# Patient Record
Sex: Female | Born: 1935 | Race: White | Hispanic: No | Marital: Married | Smoking: Never smoker
Health system: Southern US, Community
[De-identification: ages and names within clinical notes are randomized; demographics above are authoritative.]

## PROBLEM LIST (undated history)

## (undated) DIAGNOSIS — J4550 Severe persistent asthma, uncomplicated: Secondary | ICD-10-CM

## (undated) DIAGNOSIS — R519 Headache, unspecified: Secondary | ICD-10-CM

## (undated) DIAGNOSIS — E041 Nontoxic single thyroid nodule: Secondary | ICD-10-CM

## (undated) DIAGNOSIS — R600 Localized edema: Secondary | ICD-10-CM

## (undated) DIAGNOSIS — Z853 Personal history of malignant neoplasm of breast: Secondary | ICD-10-CM

## (undated) DIAGNOSIS — D649 Anemia, unspecified: Secondary | ICD-10-CM

## (undated) DIAGNOSIS — M810 Age-related osteoporosis without current pathological fracture: Secondary | ICD-10-CM

## (undated) DIAGNOSIS — L97929 Non-pressure chronic ulcer of unspecified part of left lower leg with unspecified severity: Secondary | ICD-10-CM

## (undated) DIAGNOSIS — G2581 Restless legs syndrome: Secondary | ICD-10-CM

## (undated) DIAGNOSIS — K5790 Diverticulosis of intestine, part unspecified, without perforation or abscess without bleeding: Secondary | ICD-10-CM

## (undated) DIAGNOSIS — K219 Gastro-esophageal reflux disease without esophagitis: Secondary | ICD-10-CM

## (undated) DIAGNOSIS — Z86718 Personal history of other venous thrombosis and embolism: Secondary | ICD-10-CM

## (undated) DIAGNOSIS — M5030 Other cervical disc degeneration, unspecified cervical region: Secondary | ICD-10-CM

## (undated) DIAGNOSIS — J189 Pneumonia, unspecified organism: Secondary | ICD-10-CM

## (undated) DIAGNOSIS — E785 Hyperlipidemia, unspecified: Secondary | ICD-10-CM

## (undated) DIAGNOSIS — N182 Chronic kidney disease, stage 2 (mild): Secondary | ICD-10-CM

## (undated) DIAGNOSIS — K5792 Diverticulitis of intestine, part unspecified, without perforation or abscess without bleeding: Secondary | ICD-10-CM

## (undated) DIAGNOSIS — R011 Cardiac murmur, unspecified: Secondary | ICD-10-CM

## (undated) DIAGNOSIS — L509 Urticaria, unspecified: Secondary | ICD-10-CM

## (undated) DIAGNOSIS — T783XXA Angioneurotic edema, initial encounter: Secondary | ICD-10-CM

## (undated) DIAGNOSIS — I739 Peripheral vascular disease, unspecified: Secondary | ICD-10-CM

## (undated) DIAGNOSIS — K648 Other hemorrhoids: Secondary | ICD-10-CM

## (undated) DIAGNOSIS — I10 Essential (primary) hypertension: Secondary | ICD-10-CM

## (undated) DIAGNOSIS — Z8619 Personal history of other infectious and parasitic diseases: Secondary | ICD-10-CM

## (undated) DIAGNOSIS — M069 Rheumatoid arthritis, unspecified: Secondary | ICD-10-CM

## (undated) DIAGNOSIS — J3089 Other allergic rhinitis: Secondary | ICD-10-CM

## (undated) DIAGNOSIS — C801 Malignant (primary) neoplasm, unspecified: Secondary | ICD-10-CM

## (undated) HISTORY — PX: MASTECTOMY: SHX3

## (undated) HISTORY — DX: Urticaria, unspecified: L50.9

## (undated) HISTORY — DX: Angioneurotic edema, initial encounter: T78.3XXA

## (undated) HISTORY — DX: Essential (primary) hypertension: I10

## (undated) HISTORY — DX: Diverticulosis of intestine, part unspecified, without perforation or abscess without bleeding: K57.90

## (undated) HISTORY — DX: Restless legs syndrome: G25.81

## (undated) HISTORY — DX: Chronic kidney disease, stage 2 (mild): N18.2

## (undated) HISTORY — DX: Other allergic rhinitis: J30.89

## (undated) HISTORY — PX: LUMBAR DISC SURGERY: SHX700

## (undated) HISTORY — PX: CATARACT EXTRACTION W/ INTRAOCULAR LENS  IMPLANT, BILATERAL: SHX1307

## (undated) HISTORY — PX: CARDIOVASCULAR STRESS TEST: SHX262

## (undated) HISTORY — DX: Age-related osteoporosis without current pathological fracture: M81.0

## (undated) HISTORY — PX: CARDIAC CATHETERIZATION: SHX172

## (undated) HISTORY — DX: Hyperlipidemia, unspecified: E78.5

## (undated) HISTORY — DX: Malignant (primary) neoplasm, unspecified: C80.1

## (undated) HISTORY — PX: KNEE ARTHROPLASTY: SHX992

---

## 1987-05-13 HISTORY — PX: TOTAL ABDOMINAL HYSTERECTOMY W/ BILATERAL SALPINGOOPHORECTOMY: SHX83

## 1991-05-13 HISTORY — PX: CHOLECYSTECTOMY: SHX55

## 1999-03-11 ENCOUNTER — Encounter: Payer: Self-pay | Admitting: Hematology and Oncology

## 1999-03-11 ENCOUNTER — Encounter: Admission: RE | Admit: 1999-03-11 | Discharge: 1999-03-11 | Payer: Self-pay | Admitting: Hematology and Oncology

## 2000-08-21 ENCOUNTER — Inpatient Hospital Stay (HOSPITAL_COMMUNITY): Admission: EM | Admit: 2000-08-21 | Discharge: 2000-08-25 | Payer: Self-pay | Admitting: Emergency Medicine

## 2000-08-21 ENCOUNTER — Encounter: Payer: Self-pay | Admitting: Emergency Medicine

## 2000-08-23 ENCOUNTER — Encounter: Payer: Self-pay | Admitting: Internal Medicine

## 2000-09-08 ENCOUNTER — Ambulatory Visit (HOSPITAL_COMMUNITY): Admission: RE | Admit: 2000-09-08 | Discharge: 2000-09-08 | Payer: Self-pay | Admitting: Critical Care Medicine

## 2003-12-01 ENCOUNTER — Ambulatory Visit (HOSPITAL_COMMUNITY): Admission: RE | Admit: 2003-12-01 | Discharge: 2003-12-01 | Payer: Self-pay | Admitting: Critical Care Medicine

## 2004-03-01 ENCOUNTER — Encounter: Payer: Self-pay | Admitting: Internal Medicine

## 2004-04-12 ENCOUNTER — Ambulatory Visit: Payer: Self-pay | Admitting: Internal Medicine

## 2004-04-24 ENCOUNTER — Ambulatory Visit: Payer: Self-pay | Admitting: Critical Care Medicine

## 2004-05-10 ENCOUNTER — Ambulatory Visit: Payer: Self-pay | Admitting: Critical Care Medicine

## 2004-06-12 ENCOUNTER — Ambulatory Visit: Payer: Self-pay | Admitting: Critical Care Medicine

## 2004-07-10 ENCOUNTER — Ambulatory Visit: Payer: Self-pay | Admitting: Critical Care Medicine

## 2004-07-15 ENCOUNTER — Ambulatory Visit: Payer: Self-pay | Admitting: Pulmonary Disease

## 2004-07-30 ENCOUNTER — Ambulatory Visit: Payer: Self-pay | Admitting: Critical Care Medicine

## 2004-08-08 ENCOUNTER — Ambulatory Visit: Payer: Self-pay | Admitting: Critical Care Medicine

## 2004-08-16 ENCOUNTER — Ambulatory Visit: Payer: Self-pay | Admitting: Critical Care Medicine

## 2004-08-20 ENCOUNTER — Emergency Department (HOSPITAL_COMMUNITY): Admission: EM | Admit: 2004-08-20 | Discharge: 2004-08-20 | Payer: Self-pay | Admitting: Emergency Medicine

## 2004-09-02 ENCOUNTER — Ambulatory Visit: Payer: Self-pay | Admitting: Critical Care Medicine

## 2004-09-10 ENCOUNTER — Ambulatory Visit: Payer: Self-pay | Admitting: Critical Care Medicine

## 2004-09-16 ENCOUNTER — Ambulatory Visit: Payer: Self-pay | Admitting: Pulmonary Disease

## 2004-10-01 ENCOUNTER — Ambulatory Visit: Payer: Self-pay | Admitting: Critical Care Medicine

## 2004-10-15 ENCOUNTER — Ambulatory Visit: Payer: Self-pay | Admitting: Pulmonary Disease

## 2004-10-29 ENCOUNTER — Ambulatory Visit: Payer: Self-pay | Admitting: Pulmonary Disease

## 2004-11-14 ENCOUNTER — Ambulatory Visit: Payer: Self-pay | Admitting: Critical Care Medicine

## 2004-11-28 ENCOUNTER — Ambulatory Visit: Payer: Self-pay | Admitting: Pulmonary Disease

## 2004-12-16 ENCOUNTER — Ambulatory Visit: Payer: Self-pay | Admitting: Pulmonary Disease

## 2004-12-30 ENCOUNTER — Ambulatory Visit: Payer: Self-pay | Admitting: Internal Medicine

## 2005-01-08 ENCOUNTER — Ambulatory Visit: Payer: Self-pay | Admitting: Critical Care Medicine

## 2005-01-14 ENCOUNTER — Ambulatory Visit: Payer: Self-pay | Admitting: Internal Medicine

## 2005-01-29 ENCOUNTER — Ambulatory Visit: Payer: Self-pay | Admitting: Critical Care Medicine

## 2005-02-12 ENCOUNTER — Ambulatory Visit: Payer: Self-pay | Admitting: Emergency Medicine

## 2005-02-26 ENCOUNTER — Ambulatory Visit: Payer: Self-pay | Admitting: Pulmonary Disease

## 2005-03-05 ENCOUNTER — Ambulatory Visit: Payer: Self-pay | Admitting: Critical Care Medicine

## 2005-03-12 ENCOUNTER — Ambulatory Visit: Payer: Self-pay | Admitting: Pulmonary Disease

## 2005-03-28 ENCOUNTER — Ambulatory Visit: Payer: Self-pay | Admitting: Pulmonary Disease

## 2005-04-11 ENCOUNTER — Ambulatory Visit: Payer: Self-pay | Admitting: Internal Medicine

## 2005-04-22 ENCOUNTER — Ambulatory Visit: Payer: Self-pay | Admitting: Critical Care Medicine

## 2005-04-25 ENCOUNTER — Ambulatory Visit: Payer: Self-pay | Admitting: Pulmonary Disease

## 2005-05-09 ENCOUNTER — Ambulatory Visit: Payer: Self-pay | Admitting: Pulmonary Disease

## 2005-05-13 ENCOUNTER — Ambulatory Visit: Payer: Self-pay | Admitting: Critical Care Medicine

## 2005-05-20 ENCOUNTER — Ambulatory Visit: Payer: Self-pay | Admitting: Critical Care Medicine

## 2005-05-27 ENCOUNTER — Ambulatory Visit: Payer: Self-pay | Admitting: Critical Care Medicine

## 2005-06-10 ENCOUNTER — Ambulatory Visit: Payer: Self-pay | Admitting: Pulmonary Disease

## 2005-06-24 ENCOUNTER — Ambulatory Visit: Payer: Self-pay | Admitting: Critical Care Medicine

## 2005-07-07 ENCOUNTER — Ambulatory Visit: Payer: Self-pay | Admitting: Pulmonary Disease

## 2005-07-10 ENCOUNTER — Ambulatory Visit: Payer: Self-pay | Admitting: Internal Medicine

## 2005-07-24 ENCOUNTER — Ambulatory Visit: Payer: Self-pay | Admitting: Internal Medicine

## 2005-07-31 ENCOUNTER — Ambulatory Visit: Payer: Self-pay | Admitting: Critical Care Medicine

## 2005-08-07 ENCOUNTER — Ambulatory Visit: Payer: Self-pay | Admitting: Internal Medicine

## 2005-08-22 ENCOUNTER — Ambulatory Visit: Payer: Self-pay | Admitting: Critical Care Medicine

## 2005-09-05 ENCOUNTER — Ambulatory Visit: Payer: Self-pay | Admitting: Internal Medicine

## 2005-09-16 ENCOUNTER — Emergency Department (HOSPITAL_COMMUNITY): Admission: EM | Admit: 2005-09-16 | Discharge: 2005-09-16 | Payer: Self-pay | Admitting: Emergency Medicine

## 2005-09-22 ENCOUNTER — Ambulatory Visit: Payer: Self-pay | Admitting: Pulmonary Disease

## 2005-10-03 ENCOUNTER — Ambulatory Visit: Payer: Self-pay | Admitting: Critical Care Medicine

## 2005-10-07 ENCOUNTER — Ambulatory Visit: Payer: Self-pay | Admitting: Internal Medicine

## 2005-10-22 ENCOUNTER — Ambulatory Visit: Payer: Self-pay | Admitting: Internal Medicine

## 2005-11-05 ENCOUNTER — Ambulatory Visit: Payer: Self-pay | Admitting: Pulmonary Disease

## 2005-11-05 ENCOUNTER — Ambulatory Visit: Payer: Self-pay | Admitting: Internal Medicine

## 2005-11-19 ENCOUNTER — Ambulatory Visit: Payer: Self-pay | Admitting: Critical Care Medicine

## 2005-12-03 ENCOUNTER — Ambulatory Visit: Payer: Self-pay | Admitting: Critical Care Medicine

## 2005-12-17 ENCOUNTER — Ambulatory Visit: Payer: Self-pay | Admitting: Critical Care Medicine

## 2005-12-31 ENCOUNTER — Ambulatory Visit: Payer: Self-pay | Admitting: Critical Care Medicine

## 2006-01-01 ENCOUNTER — Encounter: Payer: Self-pay | Admitting: Critical Care Medicine

## 2006-01-06 ENCOUNTER — Ambulatory Visit: Payer: Self-pay | Admitting: Critical Care Medicine

## 2006-01-14 ENCOUNTER — Ambulatory Visit: Payer: Self-pay | Admitting: Critical Care Medicine

## 2006-01-28 ENCOUNTER — Ambulatory Visit: Payer: Self-pay | Admitting: Critical Care Medicine

## 2006-03-10 ENCOUNTER — Ambulatory Visit: Payer: Self-pay | Admitting: Critical Care Medicine

## 2006-03-12 ENCOUNTER — Ambulatory Visit (HOSPITAL_COMMUNITY): Admission: RE | Admit: 2006-03-12 | Discharge: 2006-03-12 | Payer: Self-pay | Admitting: Internal Medicine

## 2006-03-31 ENCOUNTER — Ambulatory Visit: Payer: Self-pay | Admitting: Critical Care Medicine

## 2006-04-06 ENCOUNTER — Ambulatory Visit: Payer: Self-pay | Admitting: Critical Care Medicine

## 2006-04-14 ENCOUNTER — Ambulatory Visit: Payer: Self-pay | Admitting: Critical Care Medicine

## 2006-05-06 ENCOUNTER — Ambulatory Visit: Payer: Self-pay | Admitting: Critical Care Medicine

## 2006-05-14 ENCOUNTER — Ambulatory Visit: Payer: Self-pay | Admitting: Internal Medicine

## 2006-05-28 ENCOUNTER — Ambulatory Visit: Payer: Self-pay | Admitting: Internal Medicine

## 2006-06-11 ENCOUNTER — Ambulatory Visit: Payer: Self-pay | Admitting: Internal Medicine

## 2006-06-22 ENCOUNTER — Ambulatory Visit: Payer: Self-pay | Admitting: Critical Care Medicine

## 2006-06-25 ENCOUNTER — Ambulatory Visit: Payer: Self-pay | Admitting: Internal Medicine

## 2006-07-09 ENCOUNTER — Ambulatory Visit: Payer: Self-pay | Admitting: Internal Medicine

## 2006-07-23 ENCOUNTER — Ambulatory Visit: Payer: Self-pay | Admitting: Internal Medicine

## 2006-08-06 ENCOUNTER — Ambulatory Visit: Payer: Self-pay | Admitting: Critical Care Medicine

## 2006-08-20 ENCOUNTER — Ambulatory Visit: Payer: Self-pay | Admitting: Critical Care Medicine

## 2006-09-03 ENCOUNTER — Ambulatory Visit: Payer: Self-pay | Admitting: Critical Care Medicine

## 2006-09-17 ENCOUNTER — Ambulatory Visit: Payer: Self-pay | Admitting: Critical Care Medicine

## 2006-10-01 ENCOUNTER — Ambulatory Visit: Payer: Self-pay | Admitting: Critical Care Medicine

## 2006-10-13 ENCOUNTER — Ambulatory Visit: Payer: Self-pay | Admitting: Critical Care Medicine

## 2006-10-27 ENCOUNTER — Ambulatory Visit: Payer: Self-pay | Admitting: Critical Care Medicine

## 2006-11-10 ENCOUNTER — Ambulatory Visit: Payer: Self-pay | Admitting: Critical Care Medicine

## 2006-11-17 ENCOUNTER — Ambulatory Visit: Payer: Self-pay | Admitting: Critical Care Medicine

## 2006-11-24 ENCOUNTER — Ambulatory Visit: Payer: Self-pay | Admitting: Critical Care Medicine

## 2006-12-08 ENCOUNTER — Ambulatory Visit: Payer: Self-pay | Admitting: Critical Care Medicine

## 2006-12-09 ENCOUNTER — Ambulatory Visit (HOSPITAL_COMMUNITY): Admission: RE | Admit: 2006-12-09 | Discharge: 2006-12-09 | Payer: Self-pay | Admitting: Internal Medicine

## 2006-12-10 ENCOUNTER — Inpatient Hospital Stay (HOSPITAL_COMMUNITY): Admission: EM | Admit: 2006-12-10 | Discharge: 2006-12-12 | Payer: Self-pay | Admitting: *Deleted

## 2006-12-16 ENCOUNTER — Ambulatory Visit: Payer: Self-pay | Admitting: Gastroenterology

## 2006-12-18 ENCOUNTER — Ambulatory Visit: Payer: Self-pay | Admitting: Critical Care Medicine

## 2006-12-28 ENCOUNTER — Ambulatory Visit: Payer: Self-pay | Admitting: Vascular Surgery

## 2006-12-31 ENCOUNTER — Ambulatory Visit: Payer: Self-pay | Admitting: Critical Care Medicine

## 2007-01-05 ENCOUNTER — Ambulatory Visit: Payer: Self-pay | Admitting: Critical Care Medicine

## 2007-01-13 DIAGNOSIS — J309 Allergic rhinitis, unspecified: Secondary | ICD-10-CM | POA: Insufficient documentation

## 2007-01-13 DIAGNOSIS — J449 Chronic obstructive pulmonary disease, unspecified: Secondary | ICD-10-CM

## 2007-01-13 DIAGNOSIS — K219 Gastro-esophageal reflux disease without esophagitis: Secondary | ICD-10-CM

## 2007-01-19 ENCOUNTER — Ambulatory Visit: Payer: Self-pay | Admitting: Critical Care Medicine

## 2007-02-02 ENCOUNTER — Ambulatory Visit: Payer: Self-pay | Admitting: Critical Care Medicine

## 2007-02-16 ENCOUNTER — Ambulatory Visit: Payer: Self-pay | Admitting: Critical Care Medicine

## 2007-03-02 ENCOUNTER — Ambulatory Visit: Payer: Self-pay | Admitting: Critical Care Medicine

## 2007-03-16 ENCOUNTER — Ambulatory Visit: Payer: Self-pay | Admitting: Critical Care Medicine

## 2007-03-30 ENCOUNTER — Ambulatory Visit: Payer: Self-pay | Admitting: Critical Care Medicine

## 2007-04-13 ENCOUNTER — Ambulatory Visit: Payer: Self-pay | Admitting: Critical Care Medicine

## 2007-04-27 ENCOUNTER — Ambulatory Visit: Payer: Self-pay | Admitting: Critical Care Medicine

## 2007-04-30 ENCOUNTER — Ambulatory Visit: Payer: Self-pay | Admitting: Critical Care Medicine

## 2007-04-30 DIAGNOSIS — G2581 Restless legs syndrome: Secondary | ICD-10-CM | POA: Insufficient documentation

## 2007-04-30 DIAGNOSIS — I119 Hypertensive heart disease without heart failure: Secondary | ICD-10-CM

## 2007-04-30 DIAGNOSIS — J383 Other diseases of vocal cords: Secondary | ICD-10-CM

## 2007-04-30 DIAGNOSIS — M810 Age-related osteoporosis without current pathological fracture: Secondary | ICD-10-CM | POA: Insufficient documentation

## 2007-04-30 DIAGNOSIS — I10 Essential (primary) hypertension: Secondary | ICD-10-CM | POA: Insufficient documentation

## 2007-05-11 ENCOUNTER — Ambulatory Visit: Payer: Self-pay | Admitting: Critical Care Medicine

## 2007-05-25 ENCOUNTER — Ambulatory Visit: Payer: Self-pay | Admitting: Critical Care Medicine

## 2007-06-01 ENCOUNTER — Encounter: Payer: Self-pay | Admitting: Critical Care Medicine

## 2007-06-08 ENCOUNTER — Ambulatory Visit: Payer: Self-pay | Admitting: Critical Care Medicine

## 2007-06-22 ENCOUNTER — Ambulatory Visit: Payer: Self-pay | Admitting: Critical Care Medicine

## 2007-07-02 ENCOUNTER — Encounter: Payer: Self-pay | Admitting: Critical Care Medicine

## 2007-07-06 ENCOUNTER — Ambulatory Visit: Payer: Self-pay | Admitting: Critical Care Medicine

## 2007-07-14 ENCOUNTER — Encounter: Payer: Self-pay | Admitting: Critical Care Medicine

## 2007-07-20 ENCOUNTER — Ambulatory Visit: Payer: Self-pay | Admitting: Critical Care Medicine

## 2007-08-03 ENCOUNTER — Ambulatory Visit: Payer: Self-pay | Admitting: Critical Care Medicine

## 2007-08-05 DIAGNOSIS — J4550 Severe persistent asthma, uncomplicated: Secondary | ICD-10-CM | POA: Insufficient documentation

## 2007-08-17 ENCOUNTER — Ambulatory Visit: Payer: Self-pay | Admitting: Critical Care Medicine

## 2007-08-31 ENCOUNTER — Ambulatory Visit: Payer: Self-pay | Admitting: Critical Care Medicine

## 2007-09-14 ENCOUNTER — Ambulatory Visit: Payer: Self-pay | Admitting: Critical Care Medicine

## 2007-09-30 ENCOUNTER — Ambulatory Visit: Payer: Self-pay | Admitting: Critical Care Medicine

## 2007-10-14 ENCOUNTER — Ambulatory Visit: Payer: Self-pay | Admitting: Critical Care Medicine

## 2007-10-18 ENCOUNTER — Telehealth (INDEPENDENT_AMBULATORY_CARE_PROVIDER_SITE_OTHER): Payer: Self-pay | Admitting: *Deleted

## 2007-10-28 ENCOUNTER — Ambulatory Visit: Payer: Self-pay | Admitting: Critical Care Medicine

## 2007-11-11 ENCOUNTER — Ambulatory Visit: Payer: Self-pay | Admitting: Critical Care Medicine

## 2007-11-25 ENCOUNTER — Ambulatory Visit: Payer: Self-pay | Admitting: Critical Care Medicine

## 2007-12-09 ENCOUNTER — Ambulatory Visit: Payer: Self-pay | Admitting: Critical Care Medicine

## 2007-12-23 ENCOUNTER — Ambulatory Visit: Payer: Self-pay | Admitting: Critical Care Medicine

## 2007-12-23 ENCOUNTER — Telehealth: Payer: Self-pay | Admitting: Critical Care Medicine

## 2008-01-06 ENCOUNTER — Ambulatory Visit: Payer: Self-pay | Admitting: Critical Care Medicine

## 2008-01-13 ENCOUNTER — Ambulatory Visit: Payer: Self-pay | Admitting: Critical Care Medicine

## 2008-01-20 ENCOUNTER — Ambulatory Visit: Payer: Self-pay | Admitting: Critical Care Medicine

## 2008-02-03 ENCOUNTER — Ambulatory Visit: Payer: Self-pay | Admitting: Critical Care Medicine

## 2008-02-17 ENCOUNTER — Ambulatory Visit: Payer: Self-pay | Admitting: Critical Care Medicine

## 2008-03-02 ENCOUNTER — Ambulatory Visit: Payer: Self-pay | Admitting: Critical Care Medicine

## 2008-03-16 ENCOUNTER — Ambulatory Visit: Payer: Self-pay | Admitting: Critical Care Medicine

## 2008-03-21 ENCOUNTER — Ambulatory Visit: Payer: Self-pay | Admitting: Critical Care Medicine

## 2008-03-30 ENCOUNTER — Ambulatory Visit: Payer: Self-pay | Admitting: Critical Care Medicine

## 2008-04-13 ENCOUNTER — Ambulatory Visit: Payer: Self-pay | Admitting: Critical Care Medicine

## 2008-04-26 ENCOUNTER — Ambulatory Visit: Payer: Self-pay | Admitting: Critical Care Medicine

## 2008-05-10 ENCOUNTER — Ambulatory Visit: Payer: Self-pay | Admitting: Critical Care Medicine

## 2008-05-24 ENCOUNTER — Ambulatory Visit: Payer: Self-pay | Admitting: Critical Care Medicine

## 2008-06-07 ENCOUNTER — Ambulatory Visit: Payer: Self-pay | Admitting: Critical Care Medicine

## 2008-06-21 ENCOUNTER — Ambulatory Visit: Payer: Self-pay | Admitting: Critical Care Medicine

## 2008-07-05 ENCOUNTER — Ambulatory Visit: Payer: Self-pay | Admitting: Critical Care Medicine

## 2008-07-11 ENCOUNTER — Encounter: Payer: Self-pay | Admitting: Critical Care Medicine

## 2008-07-19 ENCOUNTER — Telehealth (INDEPENDENT_AMBULATORY_CARE_PROVIDER_SITE_OTHER): Payer: Self-pay | Admitting: *Deleted

## 2008-07-19 ENCOUNTER — Ambulatory Visit: Payer: Self-pay | Admitting: Critical Care Medicine

## 2008-08-02 ENCOUNTER — Encounter: Payer: Self-pay | Admitting: Internal Medicine

## 2008-08-02 ENCOUNTER — Ambulatory Visit: Payer: Self-pay | Admitting: Critical Care Medicine

## 2008-08-16 ENCOUNTER — Ambulatory Visit: Payer: Self-pay | Admitting: Critical Care Medicine

## 2008-08-30 ENCOUNTER — Ambulatory Visit: Payer: Self-pay | Admitting: Critical Care Medicine

## 2008-09-13 ENCOUNTER — Ambulatory Visit: Payer: Self-pay | Admitting: Critical Care Medicine

## 2008-09-27 ENCOUNTER — Ambulatory Visit: Payer: Self-pay | Admitting: Critical Care Medicine

## 2008-10-03 ENCOUNTER — Ambulatory Visit: Payer: Self-pay | Admitting: Critical Care Medicine

## 2008-10-06 ENCOUNTER — Ambulatory Visit (HOSPITAL_BASED_OUTPATIENT_CLINIC_OR_DEPARTMENT_OTHER): Admission: RE | Admit: 2008-10-06 | Discharge: 2008-10-06 | Payer: Self-pay | Admitting: Critical Care Medicine

## 2008-10-06 ENCOUNTER — Encounter: Payer: Self-pay | Admitting: Critical Care Medicine

## 2008-10-11 ENCOUNTER — Ambulatory Visit: Payer: Self-pay | Admitting: Critical Care Medicine

## 2008-10-12 ENCOUNTER — Ambulatory Visit: Payer: Self-pay | Admitting: Pulmonary Disease

## 2008-10-21 ENCOUNTER — Encounter: Payer: Self-pay | Admitting: Critical Care Medicine

## 2008-10-25 ENCOUNTER — Ambulatory Visit: Payer: Self-pay | Admitting: Critical Care Medicine

## 2008-11-08 ENCOUNTER — Ambulatory Visit: Payer: Self-pay | Admitting: Critical Care Medicine

## 2008-11-22 ENCOUNTER — Ambulatory Visit: Payer: Self-pay | Admitting: Critical Care Medicine

## 2008-11-30 ENCOUNTER — Telehealth: Payer: Self-pay | Admitting: Internal Medicine

## 2008-12-04 ENCOUNTER — Ambulatory Visit: Payer: Self-pay | Admitting: Critical Care Medicine

## 2008-12-18 ENCOUNTER — Ambulatory Visit: Payer: Self-pay | Admitting: Critical Care Medicine

## 2009-01-01 ENCOUNTER — Ambulatory Visit: Payer: Self-pay | Admitting: Critical Care Medicine

## 2009-01-16 ENCOUNTER — Ambulatory Visit: Payer: Self-pay | Admitting: Critical Care Medicine

## 2009-01-17 ENCOUNTER — Encounter: Payer: Self-pay | Admitting: Critical Care Medicine

## 2009-01-24 ENCOUNTER — Encounter: Payer: Self-pay | Admitting: Internal Medicine

## 2009-01-30 ENCOUNTER — Ambulatory Visit: Payer: Self-pay | Admitting: Critical Care Medicine

## 2009-02-05 ENCOUNTER — Ambulatory Visit: Payer: Self-pay | Admitting: Critical Care Medicine

## 2009-02-13 ENCOUNTER — Ambulatory Visit: Payer: Self-pay | Admitting: Critical Care Medicine

## 2009-02-27 ENCOUNTER — Ambulatory Visit: Payer: Self-pay | Admitting: Critical Care Medicine

## 2009-03-13 ENCOUNTER — Ambulatory Visit: Payer: Self-pay | Admitting: Critical Care Medicine

## 2009-03-27 ENCOUNTER — Ambulatory Visit: Payer: Self-pay | Admitting: Pulmonary Disease

## 2009-04-09 ENCOUNTER — Ambulatory Visit: Payer: Self-pay | Admitting: Critical Care Medicine

## 2009-04-23 ENCOUNTER — Ambulatory Visit: Payer: Self-pay | Admitting: Critical Care Medicine

## 2009-05-07 ENCOUNTER — Encounter: Payer: Self-pay | Admitting: Internal Medicine

## 2009-05-07 ENCOUNTER — Ambulatory Visit: Payer: Self-pay | Admitting: Critical Care Medicine

## 2009-05-21 ENCOUNTER — Ambulatory Visit: Payer: Self-pay | Admitting: Critical Care Medicine

## 2009-05-25 ENCOUNTER — Ambulatory Visit: Payer: Self-pay | Admitting: Pulmonary Disease

## 2009-05-29 ENCOUNTER — Telehealth: Payer: Self-pay | Admitting: Pulmonary Disease

## 2009-06-06 ENCOUNTER — Ambulatory Visit: Payer: Self-pay | Admitting: Critical Care Medicine

## 2009-06-21 ENCOUNTER — Ambulatory Visit: Payer: Self-pay | Admitting: Critical Care Medicine

## 2009-06-26 ENCOUNTER — Encounter: Payer: Self-pay | Admitting: Internal Medicine

## 2009-06-27 ENCOUNTER — Inpatient Hospital Stay (HOSPITAL_COMMUNITY): Admission: EM | Admit: 2009-06-27 | Discharge: 2009-07-02 | Payer: Self-pay | Admitting: Internal Medicine

## 2009-06-27 ENCOUNTER — Encounter (INDEPENDENT_AMBULATORY_CARE_PROVIDER_SITE_OTHER): Payer: Self-pay | Admitting: Internal Medicine

## 2009-06-27 ENCOUNTER — Ambulatory Visit: Payer: Self-pay | Admitting: Vascular Surgery

## 2009-06-28 ENCOUNTER — Telehealth: Payer: Self-pay | Admitting: Critical Care Medicine

## 2009-07-06 ENCOUNTER — Encounter: Payer: Self-pay | Admitting: Internal Medicine

## 2009-07-06 ENCOUNTER — Ambulatory Visit: Payer: Self-pay | Admitting: Critical Care Medicine

## 2009-07-06 ENCOUNTER — Telehealth: Payer: Self-pay | Admitting: Critical Care Medicine

## 2009-07-20 ENCOUNTER — Ambulatory Visit: Payer: Self-pay | Admitting: Critical Care Medicine

## 2009-08-03 ENCOUNTER — Ambulatory Visit: Payer: Self-pay | Admitting: Critical Care Medicine

## 2009-08-17 ENCOUNTER — Encounter: Payer: Self-pay | Admitting: Critical Care Medicine

## 2009-08-17 ENCOUNTER — Telehealth: Payer: Self-pay | Admitting: Critical Care Medicine

## 2009-08-17 ENCOUNTER — Ambulatory Visit: Payer: Self-pay | Admitting: Critical Care Medicine

## 2009-08-30 ENCOUNTER — Ambulatory Visit: Payer: Self-pay | Admitting: Critical Care Medicine

## 2009-09-04 ENCOUNTER — Ambulatory Visit: Payer: Self-pay | Admitting: Critical Care Medicine

## 2009-09-13 ENCOUNTER — Ambulatory Visit: Payer: Self-pay | Admitting: Critical Care Medicine

## 2009-09-27 ENCOUNTER — Ambulatory Visit: Payer: Self-pay | Admitting: Critical Care Medicine

## 2009-10-11 ENCOUNTER — Ambulatory Visit: Payer: Self-pay | Admitting: Critical Care Medicine

## 2009-10-25 ENCOUNTER — Ambulatory Visit: Payer: Self-pay | Admitting: Critical Care Medicine

## 2009-10-29 ENCOUNTER — Telehealth (INDEPENDENT_AMBULATORY_CARE_PROVIDER_SITE_OTHER): Payer: Self-pay | Admitting: *Deleted

## 2009-11-07 ENCOUNTER — Ambulatory Visit: Payer: Self-pay | Admitting: Critical Care Medicine

## 2009-11-22 ENCOUNTER — Ambulatory Visit: Payer: Self-pay | Admitting: Critical Care Medicine

## 2009-12-06 ENCOUNTER — Ambulatory Visit: Payer: Self-pay | Admitting: Critical Care Medicine

## 2009-12-21 ENCOUNTER — Ambulatory Visit: Payer: Self-pay | Admitting: Critical Care Medicine

## 2010-01-07 ENCOUNTER — Ambulatory Visit: Payer: Self-pay | Admitting: Critical Care Medicine

## 2010-01-21 ENCOUNTER — Ambulatory Visit: Payer: Self-pay | Admitting: Critical Care Medicine

## 2010-02-04 ENCOUNTER — Ambulatory Visit: Payer: Self-pay | Admitting: Critical Care Medicine

## 2010-02-18 ENCOUNTER — Ambulatory Visit: Payer: Self-pay | Admitting: Critical Care Medicine

## 2010-03-04 ENCOUNTER — Ambulatory Visit: Payer: Self-pay | Admitting: Critical Care Medicine

## 2010-03-04 ENCOUNTER — Encounter: Payer: Self-pay | Admitting: Internal Medicine

## 2010-03-08 ENCOUNTER — Ambulatory Visit: Payer: Self-pay | Admitting: Critical Care Medicine

## 2010-03-18 ENCOUNTER — Ambulatory Visit: Payer: Self-pay | Admitting: Critical Care Medicine

## 2010-04-01 ENCOUNTER — Ambulatory Visit: Payer: Self-pay | Admitting: Critical Care Medicine

## 2010-04-15 ENCOUNTER — Ambulatory Visit: Payer: Self-pay | Admitting: Critical Care Medicine

## 2010-04-19 ENCOUNTER — Ambulatory Visit: Payer: Self-pay | Admitting: Critical Care Medicine

## 2010-04-19 ENCOUNTER — Ambulatory Visit: Payer: Self-pay | Admitting: Cardiovascular Disease

## 2010-04-23 ENCOUNTER — Telehealth: Payer: Self-pay | Admitting: Critical Care Medicine

## 2010-04-29 ENCOUNTER — Ambulatory Visit: Payer: Self-pay | Admitting: Critical Care Medicine

## 2010-05-14 ENCOUNTER — Ambulatory Visit
Admission: RE | Admit: 2010-05-14 | Discharge: 2010-05-14 | Payer: Self-pay | Source: Home / Self Care | Attending: Critical Care Medicine | Admitting: Critical Care Medicine

## 2010-05-28 ENCOUNTER — Ambulatory Visit
Admission: RE | Admit: 2010-05-28 | Discharge: 2010-05-28 | Payer: Self-pay | Source: Home / Self Care | Attending: Critical Care Medicine | Admitting: Critical Care Medicine

## 2010-06-11 ENCOUNTER — Ambulatory Visit
Admission: RE | Admit: 2010-06-11 | Discharge: 2010-06-11 | Payer: Self-pay | Source: Home / Self Care | Attending: Critical Care Medicine | Admitting: Critical Care Medicine

## 2010-06-11 NOTE — Assessment & Plan Note (Signed)
Summary: Pulmonary OV   Primary Provider/Referring Provider:  Rodrigo Ran  CC:  2 month follow up.  Pt states when the weather changed the past couple of days she had increase in PND and coughed "all the time."  Cough was occ prod with clear mucus.  States PND and cough are improving now.  Chest heaviness this am.  Denies increased SOB and wheezing.[name]  History of Present Illness:  77 year old, white female, with  allergic rhinitis, asthmatic bronchitis, reflux disease, and vocal cord dysfunction.     September 04, 2009 8:58 AM Now c/o : more cough,  nose with pndrip.  cough is dry.  Notes some hoarseness.  Some wheeze, no real chest pain.  No f/c/s.   Notes nocturnal cough.  Tessalon without help.  Pt notes more dyspnea.  There is no excess mucous production noted.     November 07, 2009 3:16 PM Symptoms about the same.  did better in dry air environment.  Now:  not much cough.  but still with paroxysms of cough.  No warning with cough.  No chest pain.  Is tired but not dyspneic.    January 07, 2010 9:34 AM Coughed all the past week and now has chest tightness.  Now has more tightness and not as much cough.  Notes pn drip.  Also notes some dyspnea with exertion.  No f/c/s.   Pt denies any significant sore throat, nasal congestion or excess secretions, fever, chills, sweats, unintended weight loss, pleurtic or exertional chest pain, orthopnea PND, or leg swelling Pt denies any increase in rescue therapy over baseline, denies waking up needing it or having any early am or nocturnal exacerbations of coughing/wheezing/or dyspnea.   Asthma History    Asthma Control Assessment:    Age range: 12+ years    Symptoms: throughout the day    Nighttime Awakenings: 1-3/week    Interferes w/ normal activity: some limitations    SABA use (not for EIB): 0-2 days/week    ATAQ questionnaire: 1-2    Exacerbations requiring oral systemic steroids: 2 or more/year    Asthma Control Assessment: Very Poorly  Controlled   Preventive Screening-Counseling & Management  Alcohol-Tobacco     Smoking Status: never  Current Medications (verified): 1)  Allegra 180 Mg  Tabs (Fexofenadine Hcl) .[name].. 1 By Mouth Daily 2)  Alprazolam 0.5 Mg  Tabs (Alprazolam) .... 1/2 Two Times A Day 3)  Adult Aspirin Ec Low Strength 81 Mg  Tbec (Aspirin) .[name].. 1 By Mouth Daily 4)  Boniva 150 Mg  Tabs (Ibandronate Sodium) .[name].. 1 Monthly 5)  Caltrate 600+d 600-400 Mg-Unit  Tabs (Calcium Carbonate-Vitamin D) .[name].. 1 By Mouth Two Times A Day 6)  Crestor 5 Mg  Tabs (Rosuvastatin Calcium) .[name].. 1 By Mouth Daily 7)  Nexium 40 Mg  Cpdr (Esomeprazole Magnesium) .... One By Mouth Two Times A Day 8)  Klor-Con 10 10 Meq Cr-Tabs (Potassium Chloride) .... Once Daily 9)  Pramipexole Dihydrochloride 0.125 Mg Tabs (Pramipexole Dihydrochloride) .... 3 Tabs At Bedtime 10)  Symbicort 160-4.5 Mcg/act  Aero (Budesonide-Formoterol Fumarate) .... 2 Puffs Twice Daily 11)  Xolair 150 Mg  Solr (Omalizumab) .... 300mg  Subcutaneously Every 2 Weeks 12)  Tessalon Perles 100 Mg  Caps (Benzonatate) .... One To Two By Mouth Q8h As Needed 13)  Hydrochlorothiazide 25 Mg  Tabs (Hydrochlorothiazide) .... One  By Mouth Daily 14)  Singulair 10 Mg Tabs (Montelukast Sodium) .[name].. 1 By Mouth At Bedtime 15)  Aerochamber Mv   Misc (Spacer/aero-Holding  Chambers) .... Use With Symbicort 16)  Tussicaps 10-8 Mg Xr12h-Cap (Hydrocod Polst-Chlorphen Polst) .... Two Times A Day As Needed 17)  Vitamin D 1000 Unit Tabs (Cholecalciferol) .... Once Daily 18)  B-12 500 Mcg Tabs (Cyanocobalamin) .[name].. 1 By Mouth Daily 19)  Proair Hfa 108 (90 Base) Mcg/act Aers (Albuterol Sulfate) .[name].. 1 -2 Puffs Every 4 Hours As Needed 20)  Micardis 40 Mg Tabs (Telmisartan) .[name].. 1 Once Daily 21)  Benefiber  Tabs (Wheat Dextrin) .... As Needed  Allergies (verified): 1)  ! Levaquin 2)  ! Penicillin 3)  ! Codeine 4)  ! Cephalosporins 5)  ! * Eggs  Past History:  Past medical, surgical, family  and social histories (including risk factors) reviewed, and no changes noted (except as noted below).  Past Medical History: Reviewed history from 01/13/2008 and no changes required. Current Problems:  RESTLESS LEGS SYNDROME (ICD-333.94) OSTEOPOROSIS (ICD-733.00) HYPERTENSION (ICD-401.9) VOCAL CORD DISORDER (ICD-478.5) ASTHMA (ICD-493.90)     -FeV1 88% 2007 GERD (ICD-530.81) ALLERGIC RHINITIS (ICD-477.9) BRONCHITIS, OBSTRUCTIVE CHRONIC W/O EXACRB (ICD-491.20)  Family History: Reviewed history from 04/30/2007 and no changes required. Breast Cancer MI/Heart Attack  Social History: Reviewed history from 04/30/2007 and no changes required. Patient never smoked.   Review of Systems       The patient complains of shortness of breath with activity, shortness of breath at rest, productive cough, non-productive cough, nasal congestion/difficulty breathing through nose, and sneezing.  The patient denies coughing up blood, chest pain, irregular heartbeats, acid heartburn, indigestion, loss of appetite, weight change, abdominal pain, difficulty swallowing, sore throat, tooth/dental problems, headaches, itching, ear ache, anxiety, depression, hand/feet swelling, joint stiffness or pain, rash, change in color of mucus, and fever.    Vital Signs:  Patient profile:   77 year old female Height:      65 inches Weight:      168.50 pounds BMI:     28.14 O2 Sat:      99 % on Room air Temp:     97.9 degrees F oral Pulse rate:   89 / minute BP sitting:   140 / 70  (left arm) Cuff size:   regular  Vitals Entered By: Gweneth Dimitri RN (January 07, 2010 9:28 AM)  O2 Flow:  Room air CC: 2 month follow up.  Pt states when the weather changed the past couple of days she had increase in PND and coughed "all the time."  Cough was occ prod with clear mucus.  States PND and cough are improving now.  Chest heaviness this am.  Denies increased SOB and wheezing. Comments Medications reviewed with  patient Daytime contact number verified with patient. Gweneth Dimitri RN  January 07, 2010 9:30 AM    Physical Exam  Additional Exam:  Gen: Pleasant, well nourished, in no distress ENT: nares with   erythema and  with postnasal drip Neck: No JVD, no TMG, no carotid bruits Lungs: no use of accessory muscles, no dullness to percussion, no wheezes Cardiovascular: RRR, heart sounds normal, no murmurs or gallops, no peripheral edema Musculoskeletal: No deformities, no cyanosis or clubbing     Impression & Recommendations:  Problem # 1:  EXTRINSIC ASTHMA, UNSPECIFIED (ICD-493.00) Assessment Unchanged stable extrinsic asthma,  upper airway instability and vocal cord disorder, all stable plan no change in medication observation for now  Medications Added to Medication List This Visit: 1)  Aerochamber Mv Misc (Spacer/aero-holding chambers) .... Use with symbicort 2)  Nasonex 50 Mcg/act Susp (Mometasone furoate) .... Two puffs each  nostril daily 3)  Astelin 137 Mcg/spray Soln (Azelastine hcl) .... Two sprays each nostril two times a day  Complete Medication List: 1)  Allegra 180 Mg Tabs (Fexofenadine hcl) .[name].. 1 by mouth daily 2)  Alprazolam 0.5 Mg Tabs (Alprazolam) .... 1/2 two times a day 3)  Adult Aspirin Ec Low Strength 81 Mg Tbec (Aspirin) .[name].. 1 by mouth daily 4)  Boniva 150 Mg Tabs (Ibandronate sodium) .[name].. 1 monthly 5)  Caltrate 600+d 600-400 Mg-unit Tabs (Calcium carbonate-vitamin d) .[name].. 1 by mouth two times a day 6)  Crestor 5 Mg Tabs (Rosuvastatin calcium) .[name].. 1 by mouth daily 7)  Nexium 40 Mg Cpdr (Esomeprazole magnesium) .... One by mouth two times a day 8)  Klor-con 10 10 Meq Cr-tabs (Potassium chloride) .... Once daily 9)  Pramipexole Dihydrochloride 0.125 Mg Tabs (Pramipexole dihydrochloride) .... 3 tabs at bedtime 10)  Symbicort 160-4.5 Mcg/act Aero (Budesonide-formoterol fumarate) .... 2 puffs twice daily 11)  Xolair 150 Mg Solr (Omalizumab) .... 300mg  subcutaneously  every 2 weeks 12)  Tessalon Perles 100 Mg Caps (Benzonatate) .... One to two by mouth q8h as needed 13)  Hydrochlorothiazide 25 Mg Tabs (Hydrochlorothiazide) .... One  by mouth daily 14)  Singulair 10 Mg Tabs (Montelukast sodium) .[name].. 1 by mouth at bedtime 15)  Aerochamber Mv Misc (Spacer/aero-holding chambers) .... Use with symbicort 16)  Tussicaps 10-8 Mg Xr12h-cap (Hydrocod polst-chlorphen polst) .... Two times a day as needed 17)  Vitamin D 1000 Unit Tabs (Cholecalciferol) .... Once daily 18)  B-12 500 Mcg Tabs (Cyanocobalamin) .[name].. 1 by mouth daily 19)  Proair Hfa 108 (90 Base) Mcg/act Aers (Albuterol sulfate) .[name].. 1 -2 puffs every 4 hours as needed 20)  Micardis 40 Mg Tabs (Telmisartan) .[name].. 1 once daily 21)  Benefiber Tabs (Wheat dextrin) .... As needed 22)  Nasonex 50 Mcg/act Susp (Mometasone furoate) .... Two puffs each nostril daily 23)  Astelin 137 Mcg/spray Soln (Azelastine hcl) .... Two sprays each nostril two times a day  Other Orders: Est. Patient Level III ([address]) Xolair (omalizumab) 150mg  (U0454) Administration xolair injection [id number])  Patient Instructions: 1)  No change in medications 2)  Return in     2     months   Medication Administration  Injection # 1:    Medication: Xolair (omalizumab) 150mg     Diagnosis: EXTRINSIC ASTHMA, UNSPECIFIED (ICD-493.00)    Route: IM    Site: R deltoid    Exp Date: 02/09/2013    Lot #: [id number]    Mfr: Salome Spotted    Comments: xolair 300 mg, 60 units 1.2 ml x 1 in right deltoid and 1.2 ml x 1 in left deltoid pt waited 20 mins    Given by: Dimas Millin, Allergy tech.  Orders Added: 1)  Est. Patient Level III [95621] 2)  Xolair (omalizumab) 150mg  [J2357] 3)  Administration xolair injection [30865]

## 2010-06-11 NOTE — Assessment & Plan Note (Signed)
Summary: wright pt/cough,congestion,drainage/apc   Visit Type:  Follow-up Primary Provider/Referring Provider:  Rodrigo Ran  CC:  Pt c/o PND, nausea, productive cough with clear to white mucus, chest tightness, bodyaches, head and chest congestion since Monday. Pt states has tried Mucinex DM and had d/c same due to upset stomach. Pt denies fever, and cold sweats.  History of Present Illness:  77 year old, white female, with  allergic rhinitis, asthmatic bronchitis, reflux disease, and vocal cord dysfunction.     April 09, 2009 10:26 AM Having bad day today.  Symptoms as before.  Cough is dry or clear phlegm.  Feels tight in the upper chest.  Notes pn drip.  If lean over R side of nares will drain clear fluid.  Pt is dyspneic occasionally with exertion.  No real chest pain.  May 25, 2009 3:22 PM  Acute visit Got xolair shot on 1/10, Pt c/o PND, nausea, productive cough with clear to white mucus, chest tightness, bodyaches, head and chest congestion since Monday. Sneezing +, Pt states has tried Mucinex DM and had d/c same due to upset stomach. Pt denies fever, and cold sweats.  Current Medications (verified): 1)  Allegra 180 Mg  Tabs (Fexofenadine Hcl) .[name].. 1 By Mouth Daily 2)  Alprazolam 0.5 Mg  Tabs (Alprazolam) .... 1/2 Two Times A Day 3)  Adult Aspirin Ec Low Strength 81 Mg  Tbec (Aspirin) .[name].. 1 By Mouth Daily 4)  Boniva 150 Mg  Tabs (Ibandronate Sodium) .[name].. 1 Monthly 5)  Caltrate 600+d 600-400 Mg-Unit  Tabs (Calcium Carbonate-Vitamin D) .[name].. 1 By Mouth Two Times A Day 6)  Crestor 5 Mg  Tabs (Rosuvastatin Calcium) .[name].. 1 By Mouth Daily 7)  Nasonex 50 Mcg/act  Susp (Mometasone Furoate) .... Two Puff Ea Nostril Two Times A Day 8)  Nexium 40 Mg  Cpdr (Esomeprazole Magnesium) .... One By Mouth Two Times A Day 9)  Bl Potassium 99 Mg  Tabs (Potassium) .[name].. 1 By Mouth Daily 10)  Mirapex 0.125 Mg Tabs (Pramipexole Dihydrochloride) .... Three Daily At Bedtime 11)  Symbicort 160-4.5  Mcg/act  Aero (Budesonide-Formoterol Fumarate) .... 2 Puffs Twice Daily 12)  Xolair 150 Mg  Solr (Omalizumab) .... 300mg  Subcutaneously Every 2 Weeks 13)  Tessalon Perles 100 Mg  Caps (Benzonatate) .... One To Two By Mouth Q8h As Needed 14)  Hydrochlorothiazide 25 Mg  Tabs (Hydrochlorothiazide) .... One Half  By Mouth Daily 15)  Singulair 10 Mg Tabs (Montelukast Sodium) .[name].. 1 By Mouth At Bedtime 16)  Aerochamber Mv   Misc (Spacer/aero-Holding Chambers) 17)  Albuterol Sulfate (2.5 Mg/15ml) 0.083%  Nebu (Albuterol Sulfate) .... Inhale 1 Vial Via Hhn Every 4-6 Hrs As Needed For Shortness of Breath Upto 4 Times Daily 18)  Tussicaps 10-8 Mg Xr12h-Cap (Hydrocod Polst-Chlorphen Polst) .... Two Times A Day As Needed 19)  Vitamin D 1000 Unit Tabs (Cholecalciferol) .... Once Daily 20)  B-12 500 Mcg Tabs (Cyanocobalamin) .[name].. 1 By Mouth Daily 21)  Astelin 137 Mcg/spray Soln (Azelastine Hcl) .... 2 Sprays Each Nostril Two Times A Day 22)  Proair Hfa 108 (90 Base) Mcg/act Aers (Albuterol Sulfate) .[name].. 1 -2 Puffs Every 4 Hours As Needed 23)  Micardis 40 Mg Tabs (Telmisartan) .[name].. 1 Once Daily  Allergies (verified): 1)  ! Levaquin 2)  ! Penicillin 3)  ! Codeine 4)  ! Cephalosporins 5)  ! * Eggs  Past History:  Past Medical History: Last updated: 01/13/2008 Current Problems:  RESTLESS LEGS SYNDROME (ICD-333.94) OSTEOPOROSIS (ICD-733.00) HYPERTENSION (ICD-401.9) VOCAL CORD DISORDER (  ICD-478.5) ASTHMA (ICD-493.90)     -FeV1 88% 2007 GERD (ICD-530.81) ALLERGIC RHINITIS (ICD-477.9) BRONCHITIS, OBSTRUCTIVE CHRONIC W/O EXACRB (ICD-491.20)  Social History: Last updated: 04/30/2007 Patient never smoked.   Review of Systems       The patient complains of dyspnea on exertion.  The patient denies anorexia, fever, weight loss, weight gain, vision loss, decreased hearing, hoarseness, chest pain, syncope, peripheral edema, prolonged cough, headaches, hemoptysis, abdominal pain, melena, hematochezia,  severe indigestion/heartburn, hematuria, muscle weakness, suspicious skin lesions, difficulty walking, depression, unusual weight change, and abnormal bleeding.    Vital Signs:  Patient profile:   77 year old female Height:      64 inches Weight:      174 pounds O2 Sat:      98 % on Room air Temp:     98.5 degrees F oral Pulse rate:   74 / minute BP sitting:   122 / 74  (left arm) Cuff size:   regular  Vitals Entered By: Zackery Barefoot CMA (May 25, 2009 3:01 PM)  O2 Flow:  Room air CC: Pt c/o PND, nausea, productive cough with clear to white mucus, chest tightness, bodyaches, head and chest congestion since Monday. Pt states has tried Mucinex DM and had d/c same due to upset stomach. Pt denies fever, cold sweats Comments Medications reviewed with patient Zackery Barefoot CMA  May 25, 2009 3:02 PM    Physical Exam  Additional Exam:  Gen: Pleasant, well nourished, in no distress ENT: R>L nares purulence and erythema with postnasal drip Neck: No JVD, no TMG, no carotid bruits Lungs: no use of accessory muscles, no dullness to percussion, clear without rales or rhonchi Cardiovascular: RRR, heart sounds normal, no murmurs or gallops, no peripheral edema Musculoskeletal: No deformities, no cyanosis or clubbing     Impression & Recommendations:  Problem # 1:  BRONCHITIS, ACUTE (ICD-466.0)  related to URI/ sinus congestion. Z-pak x 5 ds Decongestant syrup two times a day, take tussicaps at bedtime.  The following medications were removed from the medication list:    Zithromax Z-pak 250 Mg Tabs (Azithromycin) .[name]... Take as directed Her updated medication list for this problem includes:    Symbicort 160-4.5 Mcg/act Aero (Budesonide-formoterol fumarate) .[name]... 2 puffs twice daily    Xolair 150 Mg Solr (Omalizumab) ..... 300mg  subcutaneously every 2 weeks    Tessalon Perles 100 Mg Caps (Benzonatate) ..... One to two by mouth q8h as needed    Singulair 10 Mg Tabs  (Montelukast sodium) .[name]... 1 by mouth at bedtime    Albuterol Sulfate (2.5 Mg/15ml) 0.083% Nebu (Albuterol sulfate) ..... Inhale 1 vial via hhn every 4-6 hrs as needed for shortness of breath upto 4 times daily    Tussicaps 10-8 Mg Xr12h-cap (Hydrocod polst-chlorphen polst) .[name]..[name] Two times a day as needed    Proair Hfa 108 (90 Base) Mcg/act Aers (Albuterol sulfate) .[name]... 1 -2 puffs every 4 hours as needed    Azithromycin 500 Mg Tabs (Azithromycin) ..... Once daily    Phenylephrine-guaifenesin 1.5-20 Mg/ml Liqd (Phenylephrine-guaifenesin) .Marland KitchenMarland KitchenMarland KitchenMarland Kitchen 5 ml by mouth two times a day  Orders: Est. Patient Level III (08657)  Problem # 2:  EXTRINSIC ASTHMA, UNSPECIFIED (ICD-493.00) No bspasm - ok to hold off on steroids. received xolair  Medications Added to Medication List This Visit: 1)  Azithromycin 500 Mg Tabs (Azithromycin) .... Once daily 2)  Phenylephrine-guaifenesin 1.5-20 Mg/ml Liqd (Phenylephrine-guaifenesin) .... 5 ml by mouth two times a day  Patient Instructions: 1)  Copy sent to: Dr  Wright 2)  Z-pak x 5 days 3)  COugh syrup with decongestant two times a day 1-2 tsp as needed  4)  Use tussicaps at night Prescriptions: PHENYLEPHRINE-GUAIFENESIN 1.5-20 MG/ML LIQD (PHENYLEPHRINE-GUAIFENESIN) 5 ml by mouth two times a day  #120 x 1   Entered and Authorized by:   Comer Locket Vassie Loll MD   Signed by:   Comer Locket Vassie Loll MD on 05/25/2009   Method used:   Electronically to        [facility]  [place] [address] (retail)       [address]       [place], [place]  [address]       Ph: [phone] or [phone]       Fax: [phone]   RxID:   [id number] AZITHROMYCIN 500 MG TABS (AZITHROMYCIN) once daily  #5 x 0   Entered and Authorized by:   Comer Locket. Vassie Loll MD   Signed by:   Comer Locket Vassie Loll MD on 05/25/2009   Method used:   Elect[facility]ically to        [facility]  [place] [address] (retail)       [address]       [place], [place]  [address]       Ph: [phone] or [phone]       Fax: [phone]   RxID:    [id number]   Appended Document: wright pt/cough,congestion,drainage/apc thanks   pw

## 2010-06-11 NOTE — Progress Notes (Signed)
Summary: CXR result  Phone Note Outgoing Call   Reason for Call: Discuss lab or test results Summary of Call: call pt and tell her the CXR was normal,  pneumonia has resolved.  also send copy of cxr report to her PCP. Initial call taken by: Storm Frisk MD,  August 17, 2009 11:44 AM  Follow-up for Phone Call        Pt aware of cxr results and aware copy of cxr report will be sent to her PCP - Dr. Rodrigo Ran.  Pt states she was not given a time to f/u with PW at last ov in March.  States she is still feeling really tired and still having a dry cough.  states she does not think she needs to be seen today for this just would like to know when PW wants her to f/u.  Dr. Delford Field, pls advise when pt needs to f/u.  Thanks!  Follow-up by: Gweneth Dimitri RN,  August 17, 2009 2:12 PM  Additional Follow-up for Phone Call Additional follow up Details #1::        some time in next month Additional Follow-up by: Storm Frisk MD,  August 17, 2009 2:21 PM    Additional Follow-up for Phone Call Additional follow up Details #2::    called, spoke with pt.  Pt informed PW wants her to f/u in the next wk.  Ov scheduled for 09/04/09 at 9am-pt aware of appt date/time. Pt aware to call office if she needs to prior to appt.   Follow-up by: Gweneth Dimitri RN,  August 17, 2009 5:16 PM

## 2010-06-11 NOTE — Miscellaneous (Signed)
Summary: Injection Record / [facility]    Injection Record / [facility]    Imported By: Lennie Odor 10/01/2009 15:05:05  _____________________________________________________________________  External Attachment:    Type:   Image     Comment:   External Document

## 2010-06-11 NOTE — Progress Notes (Signed)
Summary: rx  Phone Note Call from Patient Call back at Home Phone [phone]   Caller: Patient Call For: wright Reason for Call: Talk to Nurse Summary of Call: Pt calling re: her Tessolon Pearls(generic)  Pharmacysaid we denied it...can you please verify.  [facility] [place] Initial call taken by: Eugene Gavia,  October 29, 2009 3:36 PM  Follow-up for Phone Call        called and spoke to kim the pharmacist and gave rx verbally  they state they still have not rec. it electronically--pt aware rx called in  Follow-up by: Philipp Deputy CMA,  October 29, 2009 3:53 PM

## 2010-06-11 NOTE — Assessment & Plan Note (Signed)
Summary: [name]   Nurse Visit   Allergies: 1)  ! Levaquin 2)  ! Penicillin 3)  ! Codeine 4)  ! Cephalosporins 5)  ! * Eggs  Medication Administration  Injection # 1:    Medication: Xolair (omalizumab) 150mg     Diagnosis: EXTRINSIC ASTHMA, UNSPECIFIED (ICD-493.00)    Route: SQ    Site: R deltoid    Exp Date: 08/10/2012    Lot #: [id number]    Mfr: GENENTECH    Comments: 1.2 ML X RIGHT ARM & 1.2ML X LEFT ARM PT WAITED     Patient tolerated injection without complications    Given by: Glade Lloyd IN ALLERGY LAB  Orders Added: 1)  Xolair (omalizumab) 150mg  [J2357] 2)  Administration xolair injection [81191]   Medication Administration  Injection # 1:    Medication: Xolair (omalizumab) 150mg     Diagnosis: EXTRINSIC ASTHMA, UNSPECIFIED (ICD-493.00)    Route: SQ    Site: R deltoid    Exp Date: 08/10/2012    Lot #: [id number]    Mfr: GENENTECH    Comments: 1.2 ML X RIGHT ARM & 1.2ML X LEFT ARM PT WAITED     Patient tolerated injection without complications    Given by: Rosalita Chessman FORD IN ALLERGY LAB  Orders Added: 1)  Xolair (omalizumab) 150mg  [J2357] 2)  Administration xolair injection [62130]

## 2010-06-11 NOTE — Assessment & Plan Note (Signed)
Summary: xolair/ mbw   Nurse Visit   Allergies: 1)  ! Levaquin 2)  ! Penicillin 3)  ! Codeine 4)  ! Cephalosporins 5)  ! * Eggs  Medication Administration  Injection # 1:    Medication: Xolair (omalizumab) 150mg     Diagnosis: EXTRINSIC ASTHMA, UNSPECIFIED (ICD-493.00)    Route: SQ    Site: R deltoid    Exp Date: 07/2012    Lot #: [id number]    Mfr: Mendel Ryder    Comments: Injection given by Dimas Millin in allergy lab. Xolair 300mg . 1.44ml x 1 in Right and Left Deltoid. Pt waited 30 minutes.    Patient tolerated injection without complications  Orders Added: 1)  Admin of Therapeutic Inj  intramuscular or subcutaneous [96372] 2)  Xolair (omalizumab) 150mg  [J2357]

## 2010-06-11 NOTE — Assessment & Plan Note (Signed)
Summary: xolair/apc   Nurse Visit   Allergies: 1)  ! Levaquin 2)  ! Penicillin 3)  ! Codeine 4)  ! Cephalosporins 5)  ! * Eggs  Medication Administration  Injection # 1:    Medication: Xolair (omalizumab) 150mg     Diagnosis: EXTRINSIC ASTHMA, UNSPECIFIED (ICD-493.00)    Route: SQ    Site: L deltoid    Exp Date: 07/12/2012    Lot #: [id number]    Mfr: GENENTECH    Comments: 1.2 ML LEFT AND RIGHT ARM PT WAITED 30 MINS    Patient tolerated injection without complications    Given by: TAMMY SCOTT IN ALLERGY LAB  Orders Added: 1)  Xolair (omalizumab) 150mg  [J2357] 2)  Administration xolair injection [81191]   Medication Administration  Injection # 1:    Medication: Xolair (omalizumab) 150mg     Diagnosis: EXTRINSIC ASTHMA, UNSPECIFIED (ICD-493.00)    Route: SQ    Site: L deltoid    Exp Date: 07/12/2012    Lot #: [id number]    Mfr: GENENTECH    Comments: 1.2 ML LEFT AND RIGHT ARM PT WAITED 30 MINS    Patient tolerated injection without complications    Given by: TAMMY SCOTT IN ALLERGY LAB  Orders Added: 1)  Xolair (omalizumab) 150mg  [J2357] 2)  Administration xolair injection [62130]

## 2010-06-11 NOTE — Assessment & Plan Note (Signed)
Summary: xolair//jrc   Nurse Visit   Allergies: 1)  ! Levaquin 2)  ! Penicillin 3)  ! Codeine 4)  ! Cephalosporins 5)  ! * Eggs  Medication Administration  Injection # 1:    Medication: Xolair (omalizumab) 150mg     Diagnosis: EXTRINSIC ASTHMA, UNSPECIFIED (ICD-493.00)    Route: IM    Site: R deltoid    Exp Date: 02/09/2013    Lot #: [id number]    Mfr: Genetech    Comments: xolair 300 mg, 60 units, 1.2 ml in Right deltoid and 1.2 ml in left deltoid. Pt waited 20 mins.    Given by: Drucie Opitz, CMA (AAMA)  Orders Added: 1)  Administration xolair injection [phone] 2)  Xolair (omalizumab) 150mg  [J2357]

## 2010-06-11 NOTE — Assessment & Plan Note (Signed)
Summary: Pulmonary OV   Primary Provider/Referring Provider:  Rodrigo Ran  CC:  Follow up.  Pt c/o nonprod cough, increased SOB "just about all the time, and " wheezing and sinus drainage x 2wks.  denies fever.[name]  History of Present Illness:  77 year old, white female, with  allergic rhinitis, asthmatic bronchitis, reflux disease, and vocal cord dysfunction.     September 04, 2009 8:58 AM Now c/o : more cough,  nose with pndrip.  cough is dry.  Notes some hoarseness.  Some wheeze, no real chest pain.  No f/c/s.   Notes nocturnal cough.  Tessalon without help.  Pt notes more dyspnea.  There is no excess mucous production noted.     Asthma History    Asthma Control Assessment:    Age range: 12+ years    Symptoms: throughout the day    Nighttime Awakenings: 1-3/week    Interferes w/ normal activity: some limitations    SABA use (not for EIB): several times per day    ATAQ questionnaire: 1-2    Exacerbations requiring oral systemic steroids: 0-1/year    Asthma Control Assessment: Very Poorly Controlled   Preventive Screening-Counseling & Management  Alcohol-Tobacco     Smoking Status: never  Current Medications (verified): 1)  Allegra 180 Mg  Tabs (Fexofenadine Hcl) .[name].. 1 By Mouth Daily 2)  Alprazolam 0.5 Mg  Tabs (Alprazolam) .... 1/2 Two Times A Day 3)  Adult Aspirin Ec Low Strength 81 Mg  Tbec (Aspirin) .[name].. 1 By Mouth Daily 4)  Boniva 150 Mg  Tabs (Ibandronate Sodium) .[name].. 1 Monthly 5)  Caltrate 600+d 600-400 Mg-Unit  Tabs (Calcium Carbonate-Vitamin D) .[name].. 1 By Mouth Two Times A Day 6)  Crestor 5 Mg  Tabs (Rosuvastatin Calcium) .[name].. 1 By Mouth Daily 7)  Nasonex 50 Mcg/act  Susp (Mometasone Furoate) .... Two Puff Ea Nostril Two Times A Day 8)  Nexium 40 Mg  Cpdr (Esomeprazole Magnesium) .... One By Mouth Two Times A Day 9)  Klor-Con 10 10 Meq Cr-Tabs (Potassium Chloride) .... Once Daily 10)  Pramipexole Dihydrochloride 0.125 Mg Tabs (Pramipexole Dihydrochloride) .... 3 Tabs At  Bedtime 11)  Symbicort 160-4.5 Mcg/act  Aero (Budesonide-Formoterol Fumarate) .... 2 Puffs Twice Daily 12)  Xolair 150 Mg  Solr (Omalizumab) .... 300mg  Subcutaneously Every 2 Weeks 13)  Tessalon Perles 100 Mg  Caps (Benzonatate) .... One To Two By Mouth Q8h As Needed 14)  Hydrochlorothiazide 25 Mg  Tabs (Hydrochlorothiazide) .... One Half  By Mouth Daily 15)  Singulair 10 Mg Tabs (Montelukast Sodium) .[name].. 1 By Mouth At Bedtime 16)  Aerochamber Mv   Misc (Spacer/aero-Holding Chambers) .... Use With Symbicort and Rescue Inhaler 17)  Tussicaps 10-8 Mg Xr12h-Cap (Hydrocod Polst-Chlorphen Polst) .... Two Times A Day As Needed 18)  Vitamin D 1000 Unit Tabs (Cholecalciferol) .... Once Daily 19)  B-12 500 Mcg Tabs (Cyanocobalamin) .[name].. 1 By Mouth Daily 20)  Astelin 137 Mcg/spray Soln (Azelastine Hcl) .... 2 Sprays Each Nostril Two Times A Day 21)  Proair Hfa 108 (90 Base) Mcg/act Aers (Albuterol Sulfate) .[name].. 1 -2 Puffs Every 4 Hours As Needed 22)  Micardis 40 Mg Tabs (Telmisartan) .[name].. 1 Once Daily 23)  Benefiber  Tabs (Wheat Dextrin) .... As Needed  Allergies (verified): 1)  ! Levaquin 2)  ! Penicillin 3)  ! Codeine 4)  ! Cephalosporins 5)  ! * Eggs  Past History:  Past medical, surgical, family and social histories (including risk factors) reviewed, and no changes noted (except as noted  below).  Past Medical History: Reviewed history from 01/13/2008 and no changes required. Current Problems:  RESTLESS LEGS SYNDROME (ICD-333.94) OSTEOPOROSIS (ICD-733.00) HYPERTENSION (ICD-401.9) VOCAL CORD DISORDER (ICD-478.5) ASTHMA (ICD-493.90)     -FeV1 88% 2007 GERD (ICD-530.81) ALLERGIC RHINITIS (ICD-477.9) BRONCHITIS, OBSTRUCTIVE CHRONIC W/O EXACRB (ICD-491.20)  Family History: Reviewed history from 04/30/2007 and no changes required. Breast Cancer MI/Heart Attack  Social History: Reviewed history from 04/30/2007 and no changes required. Patient never smoked.   Review of Systems        The patient complains of shortness of breath with activity, non-productive cough, and nasal congestion/difficulty breathing through nose.  The patient denies shortness of breath at rest, productive cough, coughing up blood, chest pain, irregular heartbeats, acid heartburn, indigestion, loss of appetite, weight change, abdominal pain, difficulty swallowing, sore throat, tooth/dental problems, headaches, sneezing, itching, ear ache, anxiety, depression, hand/feet swelling, joint stiffness or pain, rash, change in color of mucus, and fever.    Vital Signs:  Patient profile:   77 year old female Height:      65 inches Weight:      182.25 pounds BMI:     30.44 O2 Sat:      97 % Temp:     97.9 degrees F Pulse rate:   91 / minute BP sitting:   154 / 82  (left arm) Cuff size:   regular  Vitals Entered By: Gweneth Dimitri RN (September 04, 2009 8:49 AM) CC: Follow up.  Pt c/o nonprod cough, increased SOB "just about all the time," wheezing and sinus drainage x 2wks.  denies fever. Comments Medications reviewed with patient Daytime contact number verified with patient. Gweneth Dimitri RN  September 04, 2009 8:49 AM    Physical Exam  Additional Exam:  Gen: Pleasant, well nourished, in no distress ENT: nares with   erythema and  with postnasal drip Neck: No JVD, no TMG, no carotid bruits Lungs: no use of accessory muscles, no dullness to percussion, no wheezes Cardiovascular: RRR, heart sounds normal, no murmurs or gallops, no peripheral edema Musculoskeletal: No deformities, no cyanosis or clubbing     Impression & Recommendations:  Problem # 1:  ALLERGIC RHINITIS (ICD-477.9) Assessment Deteriorated Severe allergic rhinitis with flare plan astepro samples then astelin cont nasonex/allegra depomedrol 120mg  IM pulse prednisone Her updated medication list for this problem includes:    Allegra 180 Mg Tabs (Fexofenadine hcl) .[name]... 1 by mouth daily    Nasonex 50 Mcg/act Susp (Mometasone furoate)  .[name]..[name] Two puff ea nostril two times a day    Astelin 137 Mcg/spray Soln (Azelastine hcl) .[name]... 2 sprays each nostril two times a day  Orders: Est. Patient Level IV (30865)  Problem # 2:  EXTRINSIC ASTHMA, UNSPECIFIED (ICD-493.00) Assessment: Deteriorated Asthma flare. plan see assessment number one  Medications Added to Medication List This Visit: 1)  Caltrate 600+d 600-400 Mg-unit Tabs (Calcium carbonate-vitamin d) .[name].. 1 by mouth two times a day 2)  Benefiber Tabs (Wheat dextrin) .... As needed 3)  Prednisone 10 Mg Tabs (Prednisone) .... Take as directed 4 each am x3days, 3 x 3days, 2 x 3days, 1 x 3days then stop  Complete Medication List: 1)  Allegra 180 Mg Tabs (Fexofenadine hcl) .[name].. 1 by mouth daily 2)  Alprazolam 0.5 Mg Tabs (Alprazolam) .... 1/2 two times a day 3)  Adult Aspirin Ec Low Strength 81 Mg Tbec (Aspirin) .[name].. 1 by mouth daily 4)  Boniva 150 Mg Tabs (Ibandronate sodium) .[name].. 1 monthly 5)  Caltrate 600+d 600-400 Mg-unit  Tabs (Calcium carbonate-vitamin d) .[name].. 1 by mouth two times a day 6)  Crestor 5 Mg Tabs (Rosuvastatin calcium) .[name].. 1 by mouth daily 7)  Nasonex 50 Mcg/act Susp (Mometasone furoate) .... Two puff ea nostril two times a day 8)  Nexium 40 Mg Cpdr (Esomeprazole magnesium) .... One by mouth two times a day 9)  Klor-con 10 10 Meq Cr-tabs (Potassium chloride) .... Once daily 10)  Pramipexole Dihydrochloride 0.125 Mg Tabs (Pramipexole dihydrochloride) .... 3 tabs at bedtime 11)  Symbicort 160-4.5 Mcg/act Aero (Budesonide-formoterol fumarate) .... 2 puffs twice daily 12)  Xolair 150 Mg Solr (Omalizumab) .... 300mg  subcutaneously every 2 weeks 13)  Tessalon Perles 100 Mg Caps (Benzonatate) .... One to two by mouth q8h as needed 14)  Hydrochlorothiazide 25 Mg Tabs (Hydrochlorothiazide) .... One half  by mouth daily 15)  Singulair 10 Mg Tabs (Montelukast sodium) .[name].. 1 by mouth at bedtime 16)  Aerochamber Mv Misc (Spacer/aero-holding chambers) .... Use with  symbicort and rescue inhaler 17)  Tussicaps 10-8 Mg Xr12h-cap (Hydrocod polst-chlorphen polst) .... Two times a day as needed 18)  Vitamin D 1000 Unit Tabs (Cholecalciferol) .... Once daily 19)  B-12 500 Mcg Tabs (Cyanocobalamin) .[name].. 1 by mouth daily 20)  Astelin 137 Mcg/spray Soln (Azelastine hcl) .... 2 sprays each nostril two times a day 21)  Proair Hfa 108 (90 Base) Mcg/act Aers (Albuterol sulfate) .[name].. 1 -2 puffs every 4 hours as needed 22)  Micardis 40 Mg Tabs (Telmisartan) .[name].. 1 once daily 23)  Benefiber Tabs (Wheat dextrin) .... As needed 24)  Prednisone 10 Mg Tabs (Prednisone) .... Take as directed 4 each am x3days, 3 x 3days, 2 x 3days, 1 x 3days then stop  Other Orders: Prescription Created Electronically [id number]) Depo- Medrol 40mg  (J1030) Depo- Medrol 80mg  (J1040) Admin of Therapeutic Inj  intramuscular or subcutaneous (08657)  Patient Instructions: 1)  A Depo Medro 120mg  Injection will be given 2)  Use Astepro two sprays each nostril daily until samples gone then resume astelin 3)  Prednisone pulse 10mg  4 each am x3days, 3 x 3days, 2 x 3days, 1 x 3days then stop  sent to pharmacy 4)  No other medication changes 5)  Return 6 weeks Prescriptions: PREDNISONE 10 MG  TABS (PREDNISONE) Take as directed 4 each am x3days, 3 x 3days, 2 x 3days, 1 x 3days then stop  #30 x 0   Entered and Authorized by:   Storm Frisk MD   Signed by:   Storm Frisk MD on 09/04/2009   Method used:   Electronically to        [facility]  [place] [address] (retail)       [address] [place] [address]       [place], [place]  [address]       Ph: [phone] or [phone]       Fax: [phone]   RxID:   [id number]    Medication Administration  Injection # 1:    Medication: Depo- Medrol 40mg     Diagnosis: BRONCHITIS, ACUTE (ICD-466.0)    Route: IM    Site: LUOQ gluteus    Exp Date: 03/2012    Lot #: [id number]    Mfr: [employer]    Patient tolerated injection without complications    Given by: Gweneth Dimitri RN (September 04, 2009 9:14 AM)  Injection # 2:    Medication: Depo- Medrol 80mg     Diagnosis: BRONCHITIS, ACUTE (ICD-466.0)    Route: IM    Site: LUOQ  gluteus    Exp Date: 03/2012    Lot #: [id number]    Mfr: [employer]    Patient tolerated injection without complications    Given by: Gweneth Dimitri RN (September 04, 2009 9:14 AM)  Orders Added: 1)  Est. Patient Level IV [99214] 2)  Prescription Created Electronically [phone] 3)  Depo- Medrol 40mg  [J1030] 4)  Depo- Medrol 80mg  [J1040] 5)  Admin of Therapeutic Inj  intramuscular or subcutaneous [96372]   Appended Document: Pulmonary OV fax mark perini

## 2010-06-11 NOTE — Assessment & Plan Note (Signed)
Summary: Geoffry Paradise 2 WKS ///KP   Nurse Visit   Allergies: 1)  ! Levaquin 2)  ! Penicillin 3)  ! Codeine 4)  ! Cephalosporins 5)  ! * Eggs  Medication Administration  Injection # 1:    Medication: Xolair (omalizumab) 150mg     Diagnosis: EXTRINSIC ASTHMA, UNSPECIFIED (ICD-493.00)    Route: SQ    Site: R deltoid    Exp Date: 02/2013    Lot #: [id number]    Mfr: Salome Spotted    Comments: 1.2 ML IN RIGHT AND LEFT ARM  300 MG PT WAITED 30 MINS CHARGED J2357 AND 04540    Patient tolerated injection without complications    Given by: TAMMY SCOTT IN [facility] LAB  Orders Added: 1)  Xolair (omalizumab) 150mg  [J2357] 2)  Administration xolair injection [98119]   Medication Administration  Injection # 1:    Medication: Xolair (omalizumab) 150mg     Diagnosis: EXTRINSIC ASTHMA, UNSPECIFIED (ICD-493.00)    Route: SQ    Site: R deltoid    Exp Date: 02/2013    Lot #: [id number]    Mfr: [employer]    Comments: 1.2 ML IN RIGHT AND LEFT ARM  300 MG PT WAITED 30 MINS CHARGED J2357 AND 56213    Patient tolerated injection without complications    Given by: TAMMY SCOTT IN [facility]  Orders Added: 1)  Xolair (omalizumab) 150mg  [J2357] 2)  Administration xolair injection [08657]

## 2010-06-11 NOTE — Assessment & Plan Note (Signed)
Summary: xolair 2 wks ///kp   Nurse Visit   Allergies: 1)  ! Levaquin 2)  ! Penicillin 3)  ! Codeine 4)  ! Cephalosporins 5)  ! * Eggs  Medication Administration  Injection # 1:    Medication: Xolair (omalizumab) 150mg     Diagnosis: EXTRINSIC ASTHMA, UNSPECIFIED (ICD-493.00)    Route: IM    Site: R deltoid    Exp Date: 02/09/2013    Lot #: [id number]    Mfr: Salome Spotted    Comments: Xolair 300mg  units 60 1.2 ml x Left deltoid and 1.2 ml x right deltoid Pt waited 20 mins.    Given by: Drucie Opitz, CMA  Orders Added: 1)  Xolair (omalizumab) 150mg  [J2357] 2)  Administration xolair injection [phone]

## 2010-06-11 NOTE — Miscellaneous (Signed)
Summary: Injection Record / [facility]    Injection Record /  Allergy    Imported By: Lennie Odor 03/19/2010 15:11:18  _____________________________________________________________________  External Attachment:    Type:   Image     Comment:   External Document

## 2010-06-11 NOTE — Assessment & Plan Note (Signed)
Summary: xolair/apc   Nurse Visit   Allergies: 1)  ! Levaquin 2)  ! Penicillin 3)  ! Codeine 4)  ! Cephalosporins 5)  ! * Eggs  Medication Administration  Injection # 1:    Medication: Xolair (omalizumab) 150mg     Diagnosis: EXTRINSIC ASTHMA, UNSPECIFIED (ICD-493.00)    Route: SQ    Site: R deltoid    Exp Date: 07/12/2012    Lot #: [id number]    Mfr: GENENTECH    Comments: 1.2ML IN RIGHT AND LEFT ARM PT WAITED 30 MINS    Patient tolerated injection without complications    Given by: TAMMY SCOTT IN [facility]ERGY LAB  Orders Added: 1)  Xolair (omalizumab) 150mg  [J2357] 2)  Administration xolair injection [81191]   Medication Administration  Injection # 1:    Medication: Xolair (omalizumab) 150mg     Diagnosis: EXTRINSIC ASTHMA, UNSPECIFIED (ICD-493.00)    Route: SQ    Site: R deltoid    Exp Date: 07/12/2012    Lot #: [id number]    Mfr: GENENTECH    Comments: 1.2ML IN RIGHT AND LEFT ARM PT WAITED 30 MINS    Patient tolerated injection without complications    Given by: TAMMY SCOTT IN [facility]GY [facility]  Orders Added: 1)  Xolair (omalizumab) 150mg  [J2357] 2)  Administration xolair injection [62130]

## 2010-06-11 NOTE — Progress Notes (Signed)
Summary: FYI  Phone Note Call from Patient   Caller: Patient Call For: [name] Summary of Call: pt called to let PEW know she was in hospital @ [facility] Room 1507, Dr. Cleora Fleet is taking care of her.  Went in for poss. kidney stone, found out she had pna.  You don't have to come unless you want to. Initial call taken by: Eugene Gavia,  June 28, 2009 8:10 AM  Follow-up for Phone Call        noted  pw Follow-up by: Storm Frisk MD,  June 28, 2009 9:09 AM

## 2010-06-11 NOTE — Assessment & Plan Note (Signed)
Summary: xolair/apc   Nurse Visit   Allergies: 1)  ! Levaquin 2)  ! Penicillin 3)  ! Codeine 4)  ! Cephalosporins 5)  ! * Eggs  Medication Administration  Injection # 1:    Medication: Xolair (omalizumab) 150mg     Diagnosis: EXTRINSIC ASTHMA, UNSPECIFIED (ICD-493.00)    Route: SQ    Site: R deltoid    Exp Date: 07/12/2012    Lot #: [id number]    Mfr: GENENTECH    Comments: 1.2 ML IN RIGHT AND 1.2ML IN LEFT ARM PT WAITED 20 MINS    Patient tolerated injection without complications    Given by: SUSANNE FORD IN ALLERGY LAB  Orders Added: 1)  Xolair (omalizumab) 150mg  [J2357] 2)  Administration xolair injection [id number]   Medication Administration  Injection # 1:    Medication: Xolair (omalizumab) 150mg     Diagnosis: EXTRINSIC ASTHMA, UNSPECIFIED (ICD-493.00)    Route: SQ    Site: R deltoid    Exp Date: 07/12/2012    Lot #: [id number]    Mfr: GENENTECH    Comments: 1.2 ML IN RIGHT AND 1.2ML IN LEFT ARM PT WAITED 20 MINS    Patient tolerated injection without complications    Given by: SUSANNE FORD IN [facility]ERGY LAB  Orders Added: 1)  Xolair (omalizumab) 150mg  [J2357] 2)  Administration xolair injection [81191]

## 2010-06-11 NOTE — Assessment & Plan Note (Signed)
Summary: xolair/apc   Nurse Visit   Allergies: 1)  ! Levaquin 2)  ! Penicillin 3)  ! Codeine 4)  ! Cephalosporins 5)  ! * Eggs  Medication Administration  Injection # 1:    Medication: Xolair (omalizumab) 150mg     Diagnosis: EXTRINSIC ASTHMA, UNSPECIFIED (ICD-493.00)    Route: SQ    Site: R deltoid    Exp Date: 09/09/2012    Lot #: [id number]    Mfr: GENENTECH    Comments: 1.2 ML X RIGHT AND 1.2ML IN LEFT PT WAITED 20 MINS CHARGES 96401 AND  J2357    Patient tolerated injection without complications    Given by: SUSANNE FORD IN [facility]ERGY LAB   Medication Administration  Injection # 1:    Medication: Xolair (omalizumab) 150mg     Diagnosis: EXTRINSIC ASTHMA, UNSPECIFIED (ICD-493.00)    Route: SQ    Site: R deltoid    Exp Date: 09/09/2012    Lot #: [id number]    Mfr: GENENTECH    Comments: 1.2 ML X RIGHT AND 1.2ML IN LEFT PT WAITED 20 MINS CHARGES 96401 AND  J2357    Patient tolerated injection without complications    Given by: SUSANNE FORD IN [facility]ERGY LAB

## 2010-06-11 NOTE — Miscellaneous (Signed)
Summary: Injection Record / [facility]    Injection Record /  Allergy    Imported By: Lennie Odor 10/01/2009 14:30:19  _____________________________________________________________________  External Attachment:    Type:   Image     Comment:   External Document

## 2010-06-11 NOTE — Assessment & Plan Note (Signed)
Summary: xolair/ mbw   Nurse Visit   Allergies: 1)  ! Levaquin 2)  ! Penicillin 3)  ! Codeine 4)  ! Cephalosporins 5)  ! * Eggs  Medication Administration  Injection # 1:    Medication: Xolair (omalizumab) 150mg     Diagnosis: EXTRINSIC ASTHMA, UNSPECIFIED (ICD-493.00)    Route: IM    Site: R deltoid    Exp Date: 02/09/2013    Lot #: [id number]    Mfr: Salome Spotted    Comments: xolair 300mg , 60 units, 1.2 ml in right deltoid and 1.2 ml in left deltoid. Pt waited 30 mins.    Given by: Dimas Millin, Allergy Tech  Orders Added: 1)  Xolair (omalizumab) 150mg  [J2357] 2)  Administration xolair injection [phone]

## 2010-06-11 NOTE — Procedures (Signed)
Summary: Recall / [facility]  Recall / [facility]   Imported By: Lennie Odor 10/11/2009 16:47:31  _____________________________________________________________________  External Attachment:    Type:   Image     Comment:   External Document

## 2010-06-11 NOTE — Assessment & Plan Note (Signed)
Summary: xolair/apc   Nurse Visit   Allergies: 1)  ! Levaquin 2)  ! Penicillin 3)  ! Codeine 4)  ! Cephalosporins 5)  ! * Eggs  Medication Administration  Injection # 1:    Medication: Xolair (omalizumab) 150mg     Diagnosis: EXTRINSIC ASTHMA, UNSPECIFIED (ICD-493.00)    Route: SQ    Site: R deltoid    Exp Date: 05/2012    Lot #: [id number]    Mfr: Mendel Ryder    Comments: Injection given by Dimas Millin in allergy lab. Xolair 300mg . 1.24ml x 1 in Right and Left Deltoid. Pt waited 30 minutes.   Orders Added: 1)  Admin of Therapeutic Inj  intramuscular or subcutaneous [96372] 2)  Xolair (omalizumab) 150mg  [J2357]

## 2010-06-11 NOTE — Assessment & Plan Note (Signed)
Summary: [name] ///kp   Nurse Visit   Allergies: 1)  ! Levaquin 2)  ! Penicillin 3)  ! Codeine 4)  ! Cephalosporins 5)  ! * Eggs  Medication Administration  Injection # 1:    Medication: Xolair (omalizumab) 150mg     Diagnosis: EXTRINSIC ASTHMA, UNSPECIFIED (ICD-493.00)    Route: IM    Site: R deltoid    Exp Date: 12/10/2012    Lot #: [id number]    Mfr: Salome Spotted    Comments: xolair injection 300 mg 60 units. 1.2 ml x 1 in left deltoid and 1.2 ml x1 in right deltoid. pt waited 20 mins    Given by: Clarise Cruz [facility])  Orders Added: 1)  Xolair (omalizumab) 150mg  [J2357] 2)  Administration xolair injection [04540]   Medication Administration  Injection # 1:    Medication: Xolair (omalizumab) 150mg     Diagnosis: EXTRINSIC ASTHMA, UNSPECIFIED (ICD-493.00)    Route: IM    Site: R deltoid    Exp Date: 12/10/2012    Lot #: [id number]    Mfr: Salome Spotted    Comments: xolair injection 300 mg 60 units. 1.2 ml x 1 in left deltoid and 1.2 ml x1 in right deltoid. pt waited 20 mins    Given by: Clarise Cruz [facility])  Orders Added: 1)  Xolair (omalizumab) 150mg  [J2357] 2)  Administration xolair injection [phone]

## 2010-06-11 NOTE — Assessment & Plan Note (Signed)
Summary: [name]   Nurse Visit   Allergies: 1)  ! Levaquin 2)  ! Penicillin 3)  ! Codeine 4)  ! Cephalosporins 5)  ! * Eggs  Medication Administration  Injection # 1:    Medication: Xolair (omalizumab) 150mg     Diagnosis: EXTRINSIC ASTHMA, UNSPECIFIED (ICD-493.00)    Route: IM    Site: R deltoid    Exp Date: 02/09/2013    Lot #: [id number]    Mfr: [employer]    Comments: xolair 300 mg and 60 units. 1.2 ml x 1 in Left deltoid and 1.2 ml x 1 in Right deltoid. Pt waited 30 minutes.    Given by: Dimas Millin, Allergy Tech.  Orders Added: 1)  Administration xolair injection [id number] 2)  Xolair (omalizumab) 150mg  [J2357]

## 2010-06-11 NOTE — Assessment & Plan Note (Signed)
Summary: Pulmonary OV   Primary Provider/Referring Provider:  Rodrigo Ran  CC:  6 wk follow up.  Pt states there are no chagnes in breathing or cough.  states she is still having "good days and bad days."  Bad days seem to be mainly when it is hummid.  requesting rx for tussicap-[facility] and singular, nexium, and symbicort-[facility]..  History of Present Illness:  77 year old, white female, with  allergic rhinitis, asthmatic bronchitis, reflux disease, and vocal cord dysfunction.     September 04, 2009 8:58 AM Now c/o : more cough,  nose with pndrip.  cough is dry.  Notes some hoarseness.  Some wheeze, no real chest pain.  No f/c/s.   Notes nocturnal cough.  Tessalon without help.  Pt notes more dyspnea.  There is no excess mucous production noted.     November 07, 2009 3:16 PM Symptoms about the same.  did better in dry air environment.  Now:  not much cough.  but still with paroxysms of cough.  No warning with cough.  No chest pain.  Is tired but not dyspneic.     Asthma History    Asthma Control Assessment:    Age range: 12+ years    Symptoms: 0-2 days/week    Nighttime Awakenings: 1-3/week    Interferes w/ normal activity: some limitations    SABA use (not for EIB): 0-2 days/week    ATAQ questionnaire: 1-2    Exacerbations requiring oral systemic steroids: 0-1/year    Asthma Control Assessment: Not Well Controlled   Preventive Screening-Counseling & Management  Alcohol-Tobacco     Smoking Status: never  Current Medications (verified): 1)  Allegra 180 Mg  Tabs (Fexofenadine Hcl) .[name].. 1 By Mouth Daily 2)  Alprazolam 0.5 Mg  Tabs (Alprazolam) .... 1/2 Two Times A Day 3)  Adult Aspirin Ec Low Strength 81 Mg  Tbec (Aspirin) .[name].. 1 By Mouth Daily 4)  Boniva 150 Mg  Tabs (Ibandronate Sodium) .[name].. 1 Monthly 5)  Caltrate 600+d 600-400 Mg-Unit  Tabs (Calcium Carbonate-Vitamin D) .[name].. 1 By Mouth Two Times A Day 6)  Crestor 5 Mg  Tabs (Rosuvastatin Calcium) .[name].. 1 By Mouth Daily 7)   Nasonex 50 Mcg/act  Susp (Mometasone Furoate) .... Two Puff Ea Nostril Two Times A Day 8)  Nexium 40 Mg  Cpdr (Esomeprazole Magnesium) .... One By Mouth Two Times A Day 9)  Klor-Con 10 10 Meq Cr-Tabs (Potassium Chloride) .... Once Daily 10)  Pramipexole Dihydrochloride 0.125 Mg Tabs (Pramipexole Dihydrochloride) .... 3 Tabs At Bedtime 11)  Symbicort 160-4.5 Mcg/act  Aero (Budesonide-Formoterol Fumarate) .... 2 Puffs Twice Daily 12)  Xolair 150 Mg  Solr (Omalizumab) .... 300mg  Subcutaneously Every 2 Weeks 13)  Tessalon Perles 100 Mg  Caps (Benzonatate) .... One To Two By Mouth Q8h As Needed 14)  Hydrochlorothiazide 25 Mg  Tabs (Hydrochlorothiazide) .... One  By Mouth Daily 15)  Singulair 10 Mg Tabs (Montelukast Sodium) .[name].. 1 By Mouth At Bedtime 16)  Aerochamber Mv   Misc (Spacer/aero-Holding Chambers) .... Use With Symbicort and Rescue Inhaler 17)  Tussicaps 10-8 Mg Xr12h-Cap (Hydrocod Polst-Chlorphen Polst) .... Two Times A Day As Needed 18)  Vitamin D 1000 Unit Tabs (Cholecalciferol) .... Once Daily 19)  B-12 500 Mcg Tabs (Cyanocobalamin) .[name].. 1 By Mouth Daily 20)  Astelin 137 Mcg/spray Soln (Azelastine Hcl) .... 2 Sprays Each Nostril Two Times A Day 21)  Proair Hfa 108 (90 Base) Mcg/act Aers (Albuterol Sulfate) .[name].. 1 -2 Puffs Every 4 Hours As Needed  22)  Micardis 40 Mg Tabs (Telmisartan) .[name].. 1 Once Daily 23)  Benefiber  Tabs (Wheat Dextrin) .... As Needed  Allergies (verified): 1)  ! Levaquin 2)  ! Penicillin 3)  ! Codeine 4)  ! Cephalosporins 5)  ! * Eggs  Past History:  Past medical, surgical, family and social histories (including risk factors) reviewed, and no changes noted (except as noted below).  Past Medical History: Reviewed history from 01/13/2008 and no changes required. Current Problems:  RESTLESS LEGS SYNDROME (ICD-333.94) OSTEOPOROSIS (ICD-733.00) HYPERTENSION (ICD-401.9) VOCAL CORD DISORDER (ICD-478.5) ASTHMA (ICD-493.90)     -FeV1 88% 2007 GERD  (ICD-530.81) ALLERGIC RHINITIS (ICD-477.9) BRONCHITIS, OBSTRUCTIVE CHRONIC W/O EXACRB (ICD-491.20)  Family History: Reviewed history from 04/30/2007 and no changes required. Breast Cancer MI/Heart Attack  Social History: Reviewed history from 04/30/2007 and no changes required. Patient never smoked.   Review of Systems       The patient complains of shortness of breath with activity and non-productive cough.  The patient denies shortness of breath at rest, productive cough, coughing up blood, chest pain, irregular heartbeats, acid heartburn, indigestion, loss of appetite, weight change, abdominal pain, difficulty swallowing, sore throat, tooth/dental problems, headaches, nasal congestion/difficulty breathing through nose, sneezing, itching, ear ache, anxiety, depression, hand/feet swelling, joint stiffness or pain, rash, change in color of mucus, and fever.    Vital Signs:  Patient profile:   77 year old female Height:      65 inches Weight:      176 pounds BMI:     29.39 O2 Sat:      97 % on Room air Temp:     98.1 degrees F oral Pulse rate:   91 / minute BP sitting:   12 / 84  (left arm) Cuff size:   regular  Vitals Entered By: Gweneth Dimitri RN (November 07, 2009 2:54 PM)  O2 Flow:  Room air CC: 6 wk follow up.  Pt states there are no chagnes in breathing or cough.  states she is still having "good days and bad days."  Bad days seem to be mainly when it is hummid.  requesting rx for [facility]ap[facility] and [facility], nexium, symbicort-[facility]. Comments Medications reviewed with patient Daytime contact number verified with patient. Gweneth Dimitri RN  November 07, 2009 2:54 PM    Physical Exam  Additional Exam:  Gen: Pleasant, well nourished, in no distress ENT: nares with   erythema and  with postnasal drip Neck: No JVD, no TMG, no carotid bruits Lungs: no use of accessory muscles, no dullness to percussion, no wheezes Cardiovascular: RRR, heart sounds normal, no murmurs or  gallops, no peripheral edema Musculoskeletal: No deformities, no cyanosis or clubbing     Impression & Recommendations:  Problem # 1:  EXTRINSIC ASTHMA, UNSPECIFIED (ICD-493.00) Assessment Improved  Asthma moderate persistent, stable at this time plan No change in inhaled medications.   Maintain treatment program as currently prescribed.  Medications Added to Medication List This Visit: 1)  Hydrochlorothiazide 25 Mg Tabs (Hydrochlorothiazide) .... One  by mouth daily  Complete Medication List: 1)  Allegra 180 Mg Tabs (Fexofenadine hcl) .[name].. 1 by mouth daily 2)  Alprazolam 0.5 Mg Tabs (Alprazolam) .... 1/2 two times a day 3)  Adult Aspirin Ec Low Strength 81 Mg Tbec (Aspirin) .[name].. 1 by mouth daily 4)  Boniva 150 Mg Tabs (Ibandronate sodium) .[name].. 1 monthly 5)  Caltrate 600+d 600-400 Mg-unit Tabs (Calcium carbonate-vitamin d) .[name].. 1 by mouth two times a day 6)  Crestor 5  Mg Tabs (Rosuvastatin calcium) .[name].. 1 by mouth daily 7)  Nasonex 50 Mcg/act Susp (Mometasone furoate) .... Two puff ea nostril two times a day 8)  Nexium 40 Mg Cpdr (Esomeprazole magnesium) .... One by mouth two times a day 9)  Klor-con 10 10 Meq Cr-tabs (Potassium chloride) .... Once daily 10)  Pramipexole Dihydrochloride 0.125 Mg Tabs (Pramipexole dihydrochloride) .... 3 tabs at bedtime 11)  Symbicort 160-4.5 Mcg/act Aero (Budesonide-formoterol fumarate) .... 2 puffs twice daily 12)  Xolair 150 Mg Solr (Omalizumab) .... 300mg  subcutaneously every 2 weeks 13)  Tessalon Perles 100 Mg Caps (Benzonatate) .... One to two by mouth q8h as needed 14)  Hydrochlorothiazide 25 Mg Tabs (Hydrochlorothiazide) .... One  by mouth daily 15)  Singulair 10 Mg Tabs (Montelukast sodium) .[name].. 1 by mouth at bedtime 16)  Aerochamber Mv Misc (Spacer/aero-holding chambers) .... Use with symbicort and rescue inhaler 17)  Tussicaps 10-8 Mg Xr12h-cap (Hydrocod polst-chlorphen polst) .... Two times a day as needed 18)  Vitamin D 1000 Unit Tabs  (Cholecalciferol) .... Once daily 19)  B-12 500 Mcg Tabs (Cyanocobalamin) .[name].. 1 by mouth daily 20)  Astelin 137 Mcg/spray Soln (Azelastine hcl) .... 2 sprays each nostril two times a day 21)  Proair Hfa 108 (90 Base) Mcg/act Aers (Albuterol sulfate) .[name].. 1 -2 puffs every 4 hours as needed 22)  Micardis 40 Mg Tabs (Telmisartan) .[name].. 1 once daily 23)  Benefiber Tabs (Wheat dextrin) .... As needed  Other Orders: Est. Patient Level III (16109)  Patient Instructions: 1)  No change in medications 2)  Return in     2     months  Appended Document: Pulmonary OV fax mark perini  Appended Document: Orders Update     Clinical Lists Changes        Medication Administration  Injection # 1:    Medication: Xolair (omalizumab) 150mg     Diagnosis: EXTRINSIC ASTHMA, UNSPECIFIED (ICD-493.00)    Route: SQ    Site: R deltoid    Exp Date: 02/10/2012    Lot #: [id number]    Mfr: Genetech    Comments: 1.2 ML IN RIGHT AND LEFT ARM CHARGED [id number] AND [id number]     Patient tolerated injection without complications    Given by: TAMMY SCOTT IN [facility]

## 2010-06-11 NOTE — Progress Notes (Signed)
Summary: PRESCRIPT  Phone Note From Pharmacy Call back at [phone]   [facility]  [address] [address] Call For: ALVA  Summary of Call: PHENYLEPHRINE GUAIFENESIN NOT AVAILABLE IN LIQUID. FORM PT DOES NOT WANT TABLETS Initial call taken by: Rickard Patience,  May 29, 2009 3:38 PM  Follow-up for Phone Call        Pt was just prescribed this by RA on 05-25-2009.  Please advise.  Thank you. Arman Filter LPN  May 29, 2009 3:58 PM   Additional Follow-up for Phone Call Additional follow up Details #1::        Done Additional Follow-up by: Comer Locket. Vassie Loll MD,  May 29, 2009 5:19 PM    Additional Follow-up for Phone Call Additional follow up Details #2::    Cough syrup is not available at this time in Liquid form, please advise on alternative.Carron Curie CMA  May 29, 2009 5:22 PM she will have to take tabs then or get this form a different pharmacy Follow-up by: Comer Locket. Vassie Loll MD,  May 29, 2009 5:24 PM  Additional Follow-up for Phone Call Additional follow up Details #3:: Details for Additional Follow-up Action Taken: advised pt that [facility] also does not carry this syrup either. Pt states she will stick to tablets.Carron Curie CMA  May 29, 2009 5:35 PM   New/Updated Medications: PHENYLEPHRINE-GUAIFENESIN 1.5-20 MG/ML LIQD (PHENYLEPHRINE-GUAIFENESIN) 5 ml by mouth two times a day Prescriptions: PHENYLEPHRINE-GUAIFENESIN 1.5-20 MG/ML LIQD (PHENYLEPHRINE-GUAIFENESIN) 5 ml by mouth two times a day  #140 x 1   Entered and Authorized by:   Comer Locket. Vassie Loll MD   Signed by:   Comer Locket Vassie Loll MD on 05/29/2009   Method used:   [facility]        [facility]  [place] [address] [facility]       [address]       [place], [place]  [address]       Ph: [phone] or [phone]       Fax: [phone]   RxID:   [id number]

## 2010-06-11 NOTE — Assessment & Plan Note (Signed)
Summary: xolair/apc   Nurse Visit   Allergies: 1)  ! Levaquin 2)  ! Penicillin 3)  ! Codeine 4)  ! Cephalosporins 5)  ! * Eggs  Medication Administration  Injection # 1:    Medication: Xolair (omalizumab) 150mg     Diagnosis: EXTRINSIC ASTHMA, UNSPECIFIED (ICD-493.00)    Route: SQ    Site: R deltoid    Exp Date: 09/09/2012    Lot #: [id number]    Mfr: GENENTECH    Comments: 1.2 ML IN RIGHT AND LEFT ARM PT WAITED 30 MINS    Patient tolerated injection without complications    Given by: TAMMY SCOTT IN [facility]  Orders Added: 1)  Xolair (omalizumab) 150mg  [J2357] 2)  Administration xolair injection [[id number]]   Medication Administration  Injection # 1:    Medication: Xolair (omalizumab) 150mg     Diagnosis: EXTRINSIC ASTHMA, UNSPECIFIED (ICD-493.00)    Route: SQ    Site: R deltoid    Exp Date: 09/09/2012    Lot #: [id number]    Mfr: GENENTECH    Comments: 1.2 ML IN RIGHT AND LEFT ARM PT WAITED 30 MINS    Patient tolerated injection without complications    Given by: TAMMY SCOTT IN [facility]  Orders Added: 1)  Xolair (omalizumab) 150mg  [J2357] 2)  Administration xolair injection [[id number]]

## 2010-06-11 NOTE — Letter (Signed)
Summary: [facility]  [facility]   Imported By: Lester Ubly 08/29/2009 07:41:09  _____________________________________________________________________  External Attachment:    Type:   Image     Comment:   External Document

## 2010-06-11 NOTE — Miscellaneous (Signed)
Summary: CXR   Clinical Lists Changes  Observations: Added new observation of CXR RESULTS: IMPRESSION: No acute cardiopulmonary findings.  Resolution of left lower lobe process. (08/16/2009 11:43)      CXR  Procedure date:  08/16/2009  Findings:      IMPRESSION: No acute cardiopulmonary findings.  Resolution of left lower lobe process.

## 2010-06-11 NOTE — Assessment & Plan Note (Signed)
Summary: Pulmonary OV   Primary Provider/Referring Provider:  Rodrigo Ran  CC:  2 wk follow up.  states breathing is better and states she is getting stronger.  states she is coughing vey little -dry.  .  History of Present Illness:  77 year old, white female, with  allergic rhinitis, asthmatic bronchitis, reflux disease, and vocal cord dysfunction.   July 06, 2009 9:01 AM Was hosp for PNA LL.  Asthma exac.  Pain in LLL.  sharp stabbing pain. No PE on CT Scan 2/15-2/21.  Since d/c is tired.  Pain is better.  rx omniceph and pred .  Had a yeast infxn. No real cough.  No real wheeze.    No mucous now.     July 20, 2009 10:49 AM Pt is slowly better.  No real cough now.  No chest pain now.  Chest feels tight with change in weather. Pt denies any significant sore throat, nasal congestion or excess secretions, fever, chills, sweats, unintended weight loss, pleurtic or exertional chest pain, orthopnea PND, or leg swelling.      Asthma History    Asthma Control Assessment:    Age range: 12+ years    Symptoms: 0-2 days/week    Nighttime Awakenings: 0-2/month    Interferes w/ normal activity: no limitations    SABA use (not for EIB): 0-2 days/week    ATAQ questionnaire: 0    Exacerbations requiring oral systemic steroids: 0-1/year    Asthma Control Assessment: Well Controlled   Current Medications (verified): 1)  Allegra 180 Mg  Tabs (Fexofenadine Hcl) .[name].. 1 By Mouth Daily 2)  Alprazolam 0.5 Mg  Tabs (Alprazolam) .... 1/2 Two Times A Day 3)  Adult Aspirin Ec Low Strength 81 Mg  Tbec (Aspirin) .[name].. 1 By Mouth Daily 4)  Boniva 150 Mg  Tabs (Ibandronate Sodium) .[name].. 1 Monthly 5)  Caltrate 600+d 600-400 Mg-Unit  Tabs (Calcium Carbonate-Vitamin D) .[name].. 1 By Mouth Two Times A Day *hold For 2 Wks-Will Restart in March * 6)  Crestor 5 Mg  Tabs (Rosuvastatin Calcium) .[name].. 1 By Mouth Daily 7)  Nasonex 50 Mcg/act  Susp (Mometasone Furoate) .... Two Puff Ea Nostril Two Times A Day 8)   Nexium 40 Mg  Cpdr (Esomeprazole Magnesium) .... One By Mouth Two Times A Day 9)  Klor-Con 10 10 Meq Cr-Tabs (Potassium Chloride) .... Once Daily 10)  Pramipexole Dihydrochloride 0.125 Mg Tabs (Pramipexole Dihydrochloride) .... 3 Tabs At Bedtime 11)  Symbicort 160-4.5 Mcg/act  Aero (Budesonide-Formoterol Fumarate) .... 2 Puffs Twice Daily 12)  Xolair 150 Mg  Solr (Omalizumab) .... 300mg  Subcutaneously Every 2 Weeks 13)  Tessalon Perles 100 Mg  Caps (Benzonatate) .... One To Two By Mouth Q8h As Needed 14)  Hydrochlorothiazide 25 Mg  Tabs (Hydrochlorothiazide) .... One Half  By Mouth Daily 15)  Singulair 10 Mg Tabs (Montelukast Sodium) .[name].. 1 By Mouth At Bedtime 16)  Aerochamber Mv   Misc (Spacer/aero-Holding Chambers) .... Use With Symbicort and Rescue Inhaler 17)  Tussicaps 10-8 Mg Xr12h-Cap (Hydrocod Polst-Chlorphen Polst) .... Two Times A Day As Needed 18)  Vitamin D 1000 Unit Tabs (Cholecalciferol) .... Once Daily 19)  B-12 500 Mcg Tabs (Cyanocobalamin) .[name].. 1 By Mouth Daily 20)  Astelin 137 Mcg/spray Soln (Azelastine Hcl) .... 2 Sprays Each Nostril Two Times A Day 21)  Proair Hfa 108 (90 Base) Mcg/act Aers (Albuterol Sulfate) .[name].. 1 -2 Puffs Every 4 Hours As Needed 22)  Micardis 40 Mg Tabs (Telmisartan) .[name].. 1 Once Daily  Allergies (verified):  1)  ! Levaquin 2)  ! Penicillin 3)  ! Codeine 4)  ! Cephalosporins 5)  ! * Eggs  Past History:  Past medical, surgical, family and social histories (including risk factors) reviewed, and no changes noted (except as noted below).  Past Medical History: Reviewed history from 01/13/2008 and no changes required. Current Problems:  RESTLESS LEGS SYNDROME (ICD-333.94) OSTEOPOROSIS (ICD-733.00) HYPERTENSION (ICD-401.9) VOCAL CORD DISORDER (ICD-478.5) ASTHMA (ICD-493.90)     -FeV1 88% 2007 GERD (ICD-530.81) ALLERGIC RHINITIS (ICD-477.9) BRONCHITIS, OBSTRUCTIVE CHRONIC W/O EXACRB (ICD-491.20)  Family History: Reviewed history from 04/30/2007  and no changes required. Breast Cancer MI/Heart Attack  Social History: Reviewed history from 04/30/2007 and no changes required. Patient never smoked.   Review of Systems  The patient denies shortness of breath with activity, shortness of breath at rest, productive cough, non-productive cough, coughing up blood, chest pain, irregular heartbeats, acid heartburn, indigestion, loss of appetite, weight change, abdominal pain, difficulty swallowing, sore throat, tooth/dental problems, headaches, nasal congestion/difficulty breathing through nose, sneezing, itching, ear ache, anxiety, depression, hand/feet swelling, joint stiffness or pain, rash, change in color of mucus, and fever.    Vital Signs:  Patient profile:   77 year old female Height:      65 inches Weight:      180.25 pounds BMI:     30.10 O2 Sat:      99 % on Room air Temp:     97.7 degrees F oral Pulse rate:   80 / minute BP sitting:   114 / 66  (right arm) Cuff size:   regular  Vitals Entered By: Gweneth Dimitri RN (July 20, 2009 10:27 AM)  O2 Flow:  Room air CC: 2 wk follow up.  states breathing is better and states she is getting stronger.  states she is coughing vey little -dry.   Comments Medications reviewed with patient Daytime contact number verified with patient. Gweneth Dimitri RN  July 20, 2009 10:27 AM    Physical Exam  Additional Exam:  Gen: Pleasant, well nourished, in no distress ENT: R>L nares purulence and erythema with postnasal drip Neck: No JVD, no TMG, no carotid bruits Lungs: no use of accessory muscles, no dullness to percussion, no wheezes Cardiovascular: RRR, heart sounds normal, no murmurs or gallops, no peripheral edema Musculoskeletal: No deformities, no cyanosis or clubbing     Impression & Recommendations:  Problem # 1:  PNEUMONIA, LEFT LOWER LOBE (ICD-481) Assessment Improved LLL PNA CAP now improved plan f/u cxr in 4 weeks, results to dr perini The following medications were  removed from the medication list:    Cefdinir 300 Mg Caps (Cefdinir) .[name]..[name] Two times a day until gone  Orders: Est. Patient Level III (99213)Future Orders: T-2 View CXR (71020TC) ... 08/17/2009  Problem # 2:  EXTRINSIC ASTHMA, UNSPECIFIED (ICD-493.00) Assessment: Improved  moderate persistent asthma and VCD, improved  plan No change in inhaled medications.   Maintain treatment program as currently prescribed. resume xolair  Complete Medication List: 1)  Allegra 180 Mg Tabs (Fexofenadine hcl) .[name].. 1 by mouth daily 2)  Alprazolam 0.5 Mg Tabs (Alprazolam) .... 1/2 two times a day 3)  Adult Aspirin Ec Low Strength 81 Mg Tbec (Aspirin) .[name].. 1 by mouth daily 4)  Boniva 150 Mg Tabs (Ibandronate sodium) .[name].. 1 monthly 5)  Caltrate 600+d 600-400 Mg-unit Tabs (Calcium carbonate-vitamin d) .[name].. 1 by mouth two times a day *hold for 2 wks-will restart in march * 6)  Crestor 5 Mg Tabs (Rosuvastatin calcium) .Marland KitchenMarland KitchenMarland Kitchen  1 by mouth daily 7)  Nasonex 50 Mcg/act Susp (Mometasone furoate) .... Two puff ea nostril two times a day 8)  Nexium 40 Mg Cpdr (Esomeprazole magnesium) .... One by mouth two times a day 9)  Klor-con 10 10 Meq Cr-tabs (Potassium chloride) .... Once daily 10)  Pramipexole Dihydrochloride 0.125 Mg Tabs (Pramipexole dihydrochloride) .... 3 tabs at bedtime 11)  Symbicort 160-4.5 Mcg/act Aero (Budesonide-formoterol fumarate) .... 2 puffs twice daily 12)  Xolair 150 Mg Solr (Omalizumab) .... 300mg  subcutaneously every 2 weeks 13)  Tessalon Perles 100 Mg Caps (Benzonatate) .... One to two by mouth q8h as needed 14)  Hydrochlorothiazide 25 Mg Tabs (Hydrochlorothiazide) .... One half  by mouth daily 15)  Singulair 10 Mg Tabs (Montelukast sodium) .[name].. 1 by mouth at bedtime 16)  Aerochamber Mv Misc (Spacer/aero-holding chambers) .... Use with symbicort and rescue inhaler 17)  Tussicaps 10-8 Mg Xr12h-cap (Hydrocod polst-chlorphen polst) .... Two times a day as needed 18)  Vitamin D 1000 Unit Tabs  (Cholecalciferol) .... Once daily 19)  B-12 500 Mcg Tabs (Cyanocobalamin) .[name].. 1 by mouth daily 20)  Astelin 137 Mcg/spray Soln (Azelastine hcl) .... 2 sprays each nostril two times a day 21)  Proair Hfa 108 (90 Base) Mcg/act Aers (Albuterol sulfate) .[name].. 1 -2 puffs every 4 hours as needed 22)  Micardis 40 Mg Tabs (Telmisartan) .[name].. 1 once daily  Other Orders: Xolair (omalizumab) 150mg  ([id number]) Admin of Therapeutic Inj  intramuscular or subcutaneous (66440)  Patient Instructions: 1)  No change in medications 2)  Obtain Chest xray on 08/10/09 3)  Ok to resume Geologist, engineering    Medication Administration  Injection # 1:    Medication: Xolair (omalizumab) 150mg     Diagnosis: EXTRINSIC ASTHMA, UNSPECIFIED (ICD-493.00)    Route: SQ    Site: R deltoid    Exp Date: 08/2012    Lot #: [id number]    Mfr: Mendel Ryder    Comments: Injection given by Glade Lloyd in [facility]. Pt saw MD after injection. Xolair 300mg  1.2 mL x1 in Right Deltoid.  1.2 mL x1 in Left Deltoid    Patient tolerated injection without complications  Orders Added: 1)  T-2 View CXR [71020TC] 2)  Est. Patient Level III [95638] 3)  Xolair (omalizumab) 150mg  [J2357] 4)  Admin of Therapeutic Inj  intramuscular or subcutaneous [[id number]]  Appended Document: Pulmonary OV fax mark perini

## 2010-06-11 NOTE — Assessment & Plan Note (Signed)
Summary: Pulmonary OV   Primary Provider/Referring Provider:  Rodrigo Ran  CC:  2 mo follow up.  states breathing is doing well overall.  c/o productive cough with clear phelgm x2-3wks. .  History of Present Illness:  77 year old, white female, with  allergic rhinitis, asthmatic bronchitis, reflux disease, and vocal cord dysfunction.     April 09, 2009 10:26 AM Having bad day today.  Symptoms as before.  Cough is dry or clear phlegm.  Feels tight in the upper chest.  Notes pn drip.  If lean over R side of nares will drain clear fluid.  Pt is dyspneic occasionally with exertion.  No real chest pain.  May 25, 2009 3:22 PM  Acute visit Got xolair shot on 1/10, Pt c/o PND, nausea, productive cough with clear to white mucus, chest tightness, bodyaches, head and chest congestion since Monday. Sneezing +, Pt states has tried Mucinex DM and had d/c same due to upset stomach. Pt denies fever, and cold sweats.  June 06, 2009 9:32 AM Saw Dr Vassie Loll  and had severe cough paroxysms and lots of drainage.    was rx zpak and cough med. phenylephrine/guaifenesin helped but not fully Now no wheeze,  Cough is productive clear to white mucous.  Still with pndrip and into the throat and coughs.   Notes some dyspnea with exertion.   Asthma History    Asthma Control Assessment:    Age range: 12+ years    Symptoms: throughout the day    Nighttime Awakenings: 1-3/week    Interferes w/ normal activity: some limitations    SABA use (not for EIB): 0-2 days/week    ATAQ questionnaire: 1-2    Exacerbations requiring oral systemic steroids: 0-1/year    Asthma Control Assessment: Very Poorly Controlled   Preventive Screening-Counseling & Management  Alcohol-Tobacco     Smoking Status: never  Current Medications (verified): 1)  Allegra 180 Mg  Tabs (Fexofenadine Hcl) .[name].. 1 By Mouth Daily 2)  Alprazolam 0.5 Mg  Tabs (Alprazolam) .... 1/2 Two Times A Day 3)  Adult Aspirin Ec Low Strength 81 Mg  Tbec  (Aspirin) .[name].. 1 By Mouth Daily 4)  Boniva 150 Mg  Tabs (Ibandronate Sodium) .[name].. 1 Monthly 5)  Caltrate 600+d 600-400 Mg-Unit  Tabs (Calcium Carbonate-Vitamin D) .[name].. 1 By Mouth Two Times A Day 6)  Crestor 5 Mg  Tabs (Rosuvastatin Calcium) .[name].. 1 By Mouth Daily 7)  Nasonex 50 Mcg/act  Susp (Mometasone Furoate) .... Two Puff Ea Nostril Two Times A Day 8)  Nexium 40 Mg  Cpdr (Esomeprazole Magnesium) .... One By Mouth Two Times A Day 9)  Bl Potassium 99 Mg  Tabs (Potassium) .[name].. 1 By Mouth Daily 10)  Mirapex 0.125 Mg Tabs (Pramipexole Dihydrochloride) .... Three Daily At Bedtime 11)  Symbicort 160-4.5 Mcg/act  Aero (Budesonide-Formoterol Fumarate) .... 2 Puffs Twice Daily 12)  Xolair 150 Mg  Solr (Omalizumab) .... 300mg  Subcutaneously Every 2 Weeks 13)  Tessalon Perles 100 Mg  Caps (Benzonatate) .... One To Two By Mouth Q8h As Needed 14)  Hydrochlorothiazide 25 Mg  Tabs (Hydrochlorothiazide) .... One Half  By Mouth Daily 15)  Singulair 10 Mg Tabs (Montelukast Sodium) .[name].. 1 By Mouth At Bedtime 16)  Aerochamber Mv   Misc (Spacer/aero-Holding Chambers) 17)  Albuterol Sulfate (2.5 Mg/29ml) 0.083%  Nebu (Albuterol Sulfate) .... Inhale 1 Vial Via Hhn Every 4-6 Hrs As Needed For Shortness of Breath Upto 4 Times Daily 18)  Tussicaps 10-8 Mg Xr12h-Cap (Hydrocod Polst-Chlorphen Polst) .Marland KitchenMarland KitchenMarland Kitchen  Two Times A Day As Needed 19)  Vitamin D 1000 Unit Tabs (Cholecalciferol) .... Once Daily 20)  B-12 500 Mcg Tabs (Cyanocobalamin) .[name].. 1 By Mouth Daily 21)  Astelin 137 Mcg/spray Soln (Azelastine Hcl) .... 2 Sprays Each Nostril Two Times A Day 22)  Proair Hfa 108 (90 Base) Mcg/act Aers (Albuterol Sulfate) .[name].. 1 -2 Puffs Every 4 Hours As Needed 23)  Micardis 40 Mg Tabs (Telmisartan) .[name].. 1 Once Daily 24)  Phenylephrine-Guaifenesin Tabs .... As Directed  Allergies (verified): 1)  ! Levaquin 2)  ! Penicillin 3)  ! Codeine 4)  ! Cephalosporins 5)  ! * Eggs  Past History:  Past medical, surgical, family and social  histories (including risk factors) reviewed, and no changes noted (except as noted below).  Past Medical History: Reviewed history from 01/13/2008 and no changes required. Current Problems:  RESTLESS LEGS SYNDROME (ICD-333.94) OSTEOPOROSIS (ICD-733.00) HYPERTENSION (ICD-401.9) VOCAL CORD DISORDER (ICD-478.5) ASTHMA (ICD-493.90)     -FeV1 88% 2007 GERD (ICD-530.81) ALLERGIC RHINITIS (ICD-477.9) BRONCHITIS, OBSTRUCTIVE CHRONIC W/O EXACRB (ICD-491.20)  Family History: Reviewed history from 04/30/2007 and no changes required. Breast Cancer MI/Heart Attack  Social History: Reviewed history from 04/30/2007 and no changes required. Patient never smoked.   Review of Systems       The patient complains of shortness of breath with activity, shortness of breath at rest, productive cough, non-productive cough, nasal congestion/difficulty breathing through nose, and sneezing.  The patient denies coughing up blood, chest pain, irregular heartbeats, acid heartburn, indigestion, loss of appetite, weight change, abdominal pain, difficulty swallowing, sore throat, tooth/dental problems, headaches, itching, ear ache, anxiety, depression, hand/feet swelling, joint stiffness or pain, rash, change in color of mucus, and fever.    Vital Signs:  Patient profile:   77 year old female Height:      65 inches Weight:      173.25 pounds BMI:     28.93 O2 Sat:      99 % on Room air Temp:     97.4 degrees F oral Pulse rate:   93 / minute BP sitting:   124 / 80  (left arm) Cuff size:   regular  Vitals Entered By: Gweneth Dimitri RN (June 06, 2009 9:12 AM)  O2 Flow:  Room air CC: 2 mo follow up.  states breathing is doing well overall.  c/o productive cough with clear phelgm x2-3wks.  Comments Medications reviewed with patient Daytime contact number verified with patient. Gweneth Dimitri RN  June 06, 2009 9:16 AM    Physical Exam  Additional Exam:  Gen: Pleasant, well nourished, in no  distress ENT: R>L nares purulence and erythema with postnasal drip Neck: No JVD, no TMG, no carotid bruits Lungs: no use of accessory muscles, no dullness to percussion, few exp wheezes Cardiovascular: RRR, heart sounds normal, no murmurs or gallops, no peripheral edema Musculoskeletal: No deformities, no cyanosis or clubbing     Impression & Recommendations:  Problem # 1:  BRONCHITIS, ACUTE (ICD-466.0) Assessment Deteriorated acute tracheobronchitis slow to resolve plan Resume Zpak again Prednisone 10mg  4 each am x3days, 3 x 3days, 2 x 3days, 1 x 3days then stop Use Tussicaps twice daily refill given Return one month The following medications were removed from the medication list:    Phenylephrine-guaifenesin 1.5-20 Mg/ml Liqd (Phenylephrine-guaifenesin) .Marland KitchenMarland KitchenMarland KitchenMarland Kitchen 5 ml by mouth two times a day Her updated medication list for this problem includes:    Symbicort 160-4.5 Mcg/act Aero (Budesonide-formoterol fumarate) .[name]... 2 puffs twice daily    Xolair 150  Mg Solr (Omalizumab) ..... 300mg  subcutaneously every 2 weeks    Tessalon Perles 100 Mg Caps (Benzonatate) ..... One to two by mouth q8h as needed    Singulair 10 Mg Tabs (Montelukast sodium) .[name]... 1 by mouth at bedtime    Albuterol Sulfate (2.5 Mg/16ml) 0.083% Nebu (Albuterol sulfate) ..... Inhale 1 vial via hhn every 4-6 hrs as needed for shortness of breath upto 4 times daily    Tussicaps 10-8 Mg Xr12h-cap (Hydrocod polst-chlorphen polst) .[name]..[name] Two times a day as needed    Proair Hfa 108 (90 Base) Mcg/act Aers (Albuterol sulfate) .[name]... 1 -2 puffs every 4 hours as needed    Zithromax Z-pak 250 Mg Tabs (Azithromycin) .[name]... As directed  please cancel levaquin rx already sent, pt is allergic  Orders: Est. Patient Level IV (96295)  Medications Added to Medication List This Visit: 1)  Phenylephrine-guaifenesin Tabs  .... As directed 2)  Levaquin 750 Mg Tabs (Levofloxacin) .... One by mouth daily 3)  Zithromax Z-pak 250 Mg Tabs  (Azithromycin) .... As directed  please cancel levaquin rx already sent, pt is allergic 4)  Prednisone 10 Mg Tabs (Prednisone) .... Take as directed 4 each am x3days, 3 x 3days, 2 x 3days, 1 x 3days then stop  Complete Medication List: 1)  Allegra 180 Mg Tabs (Fexofenadine hcl) .[name].. 1 by mouth daily 2)  Alprazolam 0.5 Mg Tabs (Alprazolam) .... 1/2 two times a day 3)  Adult Aspirin Ec Low Strength 81 Mg Tbec (Aspirin) .[name].. 1 by mouth daily 4)  Boniva 150 Mg Tabs (Ibandronate sodium) .[name].. 1 monthly 5)  Caltrate 600+d 600-400 Mg-unit Tabs (Calcium carbonate-vitamin d) .[name].. 1 by mouth two times a day 6)  Crestor 5 Mg Tabs (Rosuvastatin calcium) .[name].. 1 by mouth daily 7)  Nasonex 50 Mcg/act Susp (Mometasone furoate) .... Two puff ea nostril two times a day 8)  Nexium 40 Mg Cpdr (Esomeprazole magnesium) .... One by mouth two times a day 9)  Bl Potassium 99 Mg Tabs (Potassium) .[name].. 1 by mouth daily 10)  Mirapex 0.125 Mg Tabs (Pramipexole dihydrochloride) .... Three daily at bedtime 11)  Symbicort 160-4.5 Mcg/act Aero (Budesonide-formoterol fumarate) .... 2 puffs twice daily 12)  Xolair 150 Mg Solr (Omalizumab) .... 300mg  subcutaneously every 2 weeks 13)  Tessalon Perles 100 Mg Caps (Benzonatate) .... One to two by mouth q8h as needed 14)  Hydrochlorothiazide 25 Mg Tabs (Hydrochlorothiazide) .... One half  by mouth daily 15)  Singulair 10 Mg Tabs (Montelukast sodium) .[name].. 1 by mouth at bedtime 16)  Aerochamber Mv Misc (Spacer/aero-holding chambers) 17)  Albuterol Sulfate (2.5 Mg/66ml) 0.083% Nebu (Albuterol sulfate) .... Inhale 1 vial via hhn every 4-6 hrs as needed for shortness of breath upto 4 times daily 18)  Tussicaps 10-8 Mg Xr12h-cap (Hydrocod polst-chlorphen polst) .... Two times a day as needed 19)  Vitamin D 1000 Unit Tabs (Cholecalciferol) .... Once daily 20)  B-12 500 Mcg Tabs (Cyanocobalamin) .[name].. 1 by mouth daily 21)  Astelin 137 Mcg/spray Soln (Azelastine hcl) .... 2 sprays each nostril  two times a day 22)  Proair Hfa 108 (90 Base) Mcg/act Aers (Albuterol sulfate) .[name].. 1 -2 puffs every 4 hours as needed 23)  Micardis 40 Mg Tabs (Telmisartan) .[name].. 1 once daily 24)  Phenylephrine-guaifenesin Tabs  .... As directed 25)  Zithromax Z-pak 250 Mg Tabs (Azithromycin) .... As directed  please cancel levaquin rx already sent, pt is allergic 26)  Prednisone 10 Mg Tabs (Prednisone) .... Take as directed 4 each am  x3days, 3 x 3days, 2 x 3days, 1 x 3days then stop  Other Orders: Xolair (omalizumab) 150mg  (U0454) Admin of Therapeutic Inj  intramuscular or subcutaneous (09811)  Patient Instructions: 1)  Resume Zpak again 2)  Prednisone 10mg  4 each am x3days, 3 x 3days, 2 x 3days, 1 x 3days then stop 3)  Use Tussicaps twice daily refill given 4)  Return one month Prescriptions: TUSSICAPS 10-8 MG XR12H-CAP (HYDROCOD POLST-CHLORPHEN POLST) two times a day as needed  #60 x 0   Entered and Authorized by:   Storm Frisk MD   Signed by:   Storm Frisk MD on 06/06/2009   Method used:   Print then Give to Patient   RxID:   [id number] PREDNISONE 10 MG  TABS (PREDNISONE) Take as directed 4 each am x3days, 3 x 3days, 2 x 3days, 1 x 3days then stop  #30 x 0   Entered and Authorized by:   Storm Frisk MD   Signed by:   Storm Frisk MD on 06/06/2009   Method used:   Electronically to        [facility]  [place] [address] (retail)       [address] [place] [address]       [place], [place]  [address]       Ph: [phone] or [phone]       Fax: [phone]   RxID:   [id number] ZITHROMAX Z-PAK 250 MG  TABS (AZITHROMYCIN) As directed  Please cancel levaquin rx already sent, pt is allergic  #1 pak x 0   Entered and Authorized by:   Storm Frisk MD   Signed by:   Storm Frisk MD on 06/06/2009   Method used:   Electronically to        [facility]  [place] [address] (retail)       [address]       [place], [place]  [address]       Ph: [phone] or [phone]       Fax: [phone]    RxID:   [id number] LEVAQUIN 750 MG TABS (LEVOFLOXACIN) One by mouth daily  #5 x 0   Entered and Authorized by:   Storm Frisk MD   Signed by:   Storm Frisk MD on 06/06/2009   Method used:   [facility] to        [facility]  [place] [address] (retail[facility]       [address]       [place], [place]  [address]       Ph: [phone] or [phone]       Fax: [phone]   RxID:   [id number]      Medication Administration  Injection # 1:    Medication: Xolair (omalizumab) 150mg     Diagnosis: EXTRINSIC ASTHMA, UNSPECIFIED (ICD-493.00)    Route: SQ    Site: L deltoid    Exp Date: 07/2012    Lot #: [id number]    Mfr: Mendel Ryder    Comments: Given by Dimas Millin in Allergy Lab. Xolair 300mg  1.2 ml x2 in left deltoid. Pt was seen by MD after injections.    Patient tolerated injection without complications  Orders Added: 1)  Est. Patient Level IV [36144] 2)  Xolair (omalizumab) 150mg  [J2357] 3)  Admin of Therapeutic Inj  intramuscular or subcutaneous [96372]   Appended Document: Pulmonary OV fax Rodrigo Ran

## 2010-06-11 NOTE — Assessment & Plan Note (Signed)
Summary: xolair/apc   Nurse Visit   Allergies: 1)  ! Levaquin 2)  ! Penicillin 3)  ! Codeine 4)  ! Cephalosporins 5)  ! * Eggs  Medication Administration  Injection # 1:    Medication: Xolair (omalizumab) 150mg     Diagnosis: EXTRINSIC ASTHMA, UNSPECIFIED (ICD-493.00)    Route: SQ    Site: R deltoid    Exp Date: 07/12/2012    Lot #: [id number]    Mfr: GENENTECH    Comments: 1.2ML X RIGHT ARM AND 1.2 ML IN LEFT ARM PT WAITED 20 MINS    Patient tolerated injection without complications    Given by: SUSANNE FORD IN ALLERGY LAB  Orders Added: 1)  Xolair (omalizumab) 150mg  [J2357] 2)  Administration xolair injection [[id number]]   Medication Administration  Injection # 1:    Medication: Xolair (omalizumab) 150mg     Diagnosis: EXTRINSIC ASTHMA, UNSPECIFIED (ICD-493.00)    Route: SQ    Site: R deltoid    Exp Date: 07/12/2012    Lot #: [id number]    Mfr: GENENTECH    Comments: 1.2ML X RIGHT ARM AND 1.2 ML IN LEFT ARM PT WAITED 20 MINS    Patient tolerated injection without complications    Given by: SUSANNE FORD IN ALLERGY LAB  Orders Added: 1)  Xolair (omalizumab) 150mg  [J2357] 2)  Administration xolair injection [[id number]]

## 2010-06-11 NOTE — Assessment & Plan Note (Signed)
Summary: xoaiar/mhh   Nurse Visit   Allergies: 1)  ! Levaquin 2)  ! Penicillin 3)  ! Codeine 4)  ! Cephalosporins 5)  ! * Eggs  Medication Administration  Injection # 1:    Medication: Xolair (omalizumab) 150mg     Diagnosis: EXTRINSIC ASTHMA, UNSPECIFIED (ICD-493.00)    Route: SQ    Site: L deltoid    Exp Date: 02/2013    Lot #: [id number]    Mfr: Genetech    Comments: 1.2ML LEFT AND RIGHT 300MG  PT WAITED 30 MINS CHARGED J2357 AND 13244    Patient tolerated injection without complications    Given by: TAMMY SCOTT IN ALLERGY LAB  Orders Added: 1)  Xolair (omalizumab) 150mg  [J2357] 2)  Administration xolair injection [id number]   Medication Administration  Injection # 1:    Medication: Xolair (omalizumab) 150mg     Diagnosis: EXTRINSIC ASTHMA, UNSPECIFIED (ICD-493.00)    Route: SQ    Site: L deltoid    Exp Date: 02/2013    Lot #: [id number]    Mfr: Genetech    Comments: 1.2ML LEFT AND RIGHT 300MG  PT WAITED 30 MINS CHARGED J2357 AND 53664    Patient tolerated injection without complications    Given by: TAMMY SCOTT IN ALLERGY LAB  Orders Added: 1)  Xolair (omalizumab) 150mg  [J2357] 2)  Administration xolair injection [[id number]]

## 2010-06-11 NOTE — Assessment & Plan Note (Signed)
Summary: [name] ///kp   Nurse Visit   Allergies: 1)  ! Levaquin 2)  ! Penicillin 3)  ! Codeine 4)  ! Cephalosporins 5)  ! * Eggs  Orders Added: 1)  No Charge Patient Arrived (NCPA0) [NCPA0]

## 2010-06-11 NOTE — Assessment & Plan Note (Signed)
Summary: xolair///kp   Nurse Visit   Allergies: 1)  ! Levaquin 2)  ! Penicillin 3)  ! Codeine 4)  ! Cephalosporins 5)  ! * Eggs  Medication Administration  Injection # 1:    Medication: Xolair (omalizumab) 150mg     Diagnosis: 493.00    Route: SQ    Site: R deltoid    Exp Date: 12/2012    Lot #: [id number]    Mfr: Salome Spotted    Comments: 1.2 ML IN RIGHT AND LEFT ARM PT WAITED 20 MINS CHARGED J2357 AND 56213    Patient tolerated injection without complications    Given by: SUSANNE FORD IN ALLERGY LAB   Medication Administration  Injection # 1:    Medication: Xolair (omalizumab) 150mg     Diagnosis: 493.00    Route: SQ    Site: R deltoid    Exp Date: 12/2012    Lot #: [id number]    Mfr: Salome Spotted    Comments: 1.2 ML IN RIGHT AND LEFT ARM PT WAITED 20 MINS CHARGED J2357 AND 46962    Patient tolerated injection without complications    Given by: SUSANNE FORD IN ALLERGY LAB

## 2010-06-11 NOTE — Assessment & Plan Note (Signed)
Summary: xolair/apc   Nurse Visit   Allergies: 1)  ! Levaquin 2)  ! Penicillin 3)  ! Codeine 4)  ! Cephalosporins 5)  ! * Eggs  Medication Administration  Injection # 1:    Medication: Xolair (omalizumab) 150mg     Diagnosis: EXTRINSIC ASTHMA, UNSPECIFIED (ICD-493.00)    Route: IM    Site: R deltoid    Exp Date: 02/09/2013    Lot #: [id number]    Mfr: Salome Spotted    Comments: xolair 300 mg, unit 60 units , 1.2 ml x 1 left deltoid, 1.2 ml x 1 right deltoid Pt did not wait.    Given by: Kandice Hams, CMA  Orders Added: 1)  Xolair (omalizumab) 150mg  [J2357] 2)  Administration xolair injection [phone]

## 2010-06-11 NOTE — Progress Notes (Signed)
Summary: CXR result  Phone Note Outgoing Call   Reason for Call: Discuss lab or test results Summary of Call: Call the pt and tell her the CXR is normal,  pneumonia is gone Initial call taken by: Storm Frisk MD,  July 06, 2009 11:46 AM  Follow-up for Phone Call        called, spoke with pt.  Pt informed CXR normal, pna gone per PW,  she verbalized understanding and had no questions.   Follow-up by: Gweneth Dimitri RN,  July 06, 2009 12:14 PM

## 2010-06-11 NOTE — Assessment & Plan Note (Signed)
Summary: Pulmonary OV   Primary Provider/Referring Provider:  Rodrigo Ran  CC:  2 month follow up.  Cough, runny nose, and increased SOB "all the time" x 2 days.  Cough is occ prod with clear mucus.  .  History of Present Illness:  77 year old, white female, with  allergic rhinitis, asthmatic bronchitis, reflux disease, and vocal cord dysfunction.     .March 08, 2010 9:19 AM Now worse with changes in weather.    No sneezing, just day and night cough.  Comes and goes.  Cough is dry.  Nose does run some. Sl wheeze and dyspnea.     Current Medications (verified): 1)  Allegra 180 Mg  Tabs (Fexofenadine Hcl) .[name].. 1 By Mouth Daily 2)  Alprazolam 0.5 Mg  Tabs (Alprazolam) .... 1/2 Two Times A Day 3)  Adult Aspirin Ec Low Strength 81 Mg  Tbec (Aspirin) .[name].. 1 By Mouth Daily 4)  Boniva 150 Mg  Tabs (Ibandronate Sodium) .[name].. 1 Monthly 5)  Caltrate 600+d 600-400 Mg-Unit  Tabs (Calcium Carbonate-Vitamin D) .[name].. 1 By Mouth Two Times A Day 6)  Crestor 5 Mg  Tabs (Rosuvastatin Calcium) .[name].. 1 By Mouth Daily 7)  Nexium 40 Mg  Cpdr (Esomeprazole Magnesium) .... One By Mouth Two Times A Day 8)  Klor-Con 10 10 Meq Cr-Tabs (Potassium Chloride) .... 2 Tabs Once Daily 9)  Pramipexole Dihydrochloride 0.125 Mg Tabs (Pramipexole Dihydrochloride) .... 3 Tabs At Bedtime 10)  Symbicort 160-4.5 Mcg/act  Aero (Budesonide-Formoterol Fumarate) .... 2 Puffs Twice Daily 11)  Xolair 150 Mg  Solr (Omalizumab) .... 300mg  Subcutaneously Every 2 Weeks 12)  Tessalon Perles 100 Mg  Caps (Benzonatate) .... One To Two By Mouth Q8h As Needed 13)  Hydrochlorothiazide 25 Mg  Tabs (Hydrochlorothiazide) .... One  By Mouth Daily 14)  Singulair 10 Mg Tabs (Montelukast Sodium) .[name].. 1 By Mouth At Bedtime 15)  Aerochamber Mv   Misc (Spacer/aero-Holding Chambers) .... Use With Symbicort 16)  Tussicaps 10-8 Mg Xr12h-Cap (Hydrocod Polst-Chlorphen Polst) .... Two Times A Day As Needed 17)  Vitamin D3 1000 Unit Tabs (Cholecalciferol) ....  Take 1 Tablet By Mouth Once A Day 18)  B-12 500 Mcg Tabs (Cyanocobalamin) .[name].. 1 By Mouth Daily 19)  Proair Hfa 108 (90 Base) Mcg/act Aers (Albuterol Sulfate) .[name].. 1 -2 Puffs Every 4 Hours As Needed 20)  Micardis 40 Mg Tabs (Telmisartan) .[name].. 1 Once Daily 21)  Benefiber  Tabs (Wheat Dextrin) .... As Needed 22)  Nasonex 50 Mcg/act  Susp (Mometasone Furoate) .... Two Puffs Each Nostril Two Times A Day 23)  Astelin 137 Mcg/spray Soln (Azelastine Hcl) .... Two Sprays Each Nostril Two Times A Day  Allergies (verified): 1)  ! Levaquin 2)  ! Penicillin 3)  ! Codeine 4)  ! Cephalosporins 5)  ! * Eggs  Past History:  Past medical, surgical, family and social histories (including risk factors) reviewed, and no changes noted (except as noted below).  Past Medical History: Reviewed history from 01/13/2008 and no changes required. Current Problems:  RESTLESS LEGS SYNDROME (ICD-333.94) OSTEOPOROSIS (ICD-733.00) HYPERTENSION (ICD-401.9) VOCAL CORD DISORDER (ICD-478.5) ASTHMA (ICD-493.90)     -FeV1 88% 2007 GERD (ICD-530.81) ALLERGIC RHINITIS (ICD-477.9) BRONCHITIS, OBSTRUCTIVE CHRONIC W/O EXACRB (ICD-491.20)  Family History: Reviewed history from 04/30/2007 and no changes required. Breast Cancer MI/Heart Attack  Social History: Reviewed history from 04/30/2007 and no changes required. Patient never smoked.   Review of Systems       The patient complains of shortness of breath with activity, non-productive cough,  and sore throat.  The patient denies shortness of breath at rest, productive cough, coughing up blood, chest pain, irregular heartbeats, acid heartburn, indigestion, loss of appetite, weight change, abdominal pain, difficulty swallowing, tooth/dental problems, headaches, nasal congestion/difficulty breathing through nose, sneezing, itching, ear ache, anxiety, depression, hand/feet swelling, joint stiffness or pain, rash, change in color of mucus, and fever.    Vital  Signs:  Patient profile:   77 year old female Height:      65 inches Weight:      174.38 pounds BMI:     29.12 O2 Sat:      99 % on Room air Temp:     97.5 degrees F oral Pulse rate:   82 / minute BP sitting:   142 / 70  (left arm) Cuff size:   regular  Vitals Entered By: Gweneth Dimitri RN (March 08, 2010 9:16 AM)  O2 Flow:  Room air CC: 2 month follow up.  Cough, runny nose, increased SOB "all the time" x 2 days.  Cough is occ prod with clear mucus.   Comments Medications reviewed with patient Daytime contact number verified with patient. Gweneth Dimitri RN  March 08, 2010 9:16 AM    Physical Exam  Additional Exam:  Gen: Pleasant, well nourished, in no distress ENT: nares with   erythema and  with postnasal drip Neck: No JVD, no TMG, no carotid bruits Lungs: no use of accessory muscles, no dullness to percussion, no wheezes Cardiovascular: RRR, heart sounds normal, no murmurs or gallops, no peripheral edema Musculoskeletal: No deformities, no cyanosis or clubbing     Impression & Recommendations:  Problem # 1:  ALLERGIC RHINITIS (ICD-477.9) Assessment Unchanged chronic allergic rhinitis plan No change in inhaled medications.   Maintain treatment program as currently prescribed.  Her updated medication list for this problem includes:    Allegra 180 Mg Tabs (Fexofenadine hcl) .[name]... 1 by mouth daily    Nasonex 50 Mcg/act Susp (Mometasone furoate) .[name]..[name] Two puffs each nostril two times a day    Astelin 137 Mcg/spray Soln (Azelastine hcl) .[name]..[name] Two sprays each nostril two times a day  Problem # 2:  EXTRINSIC ASTHMA, UNSPECIFIED (ICD-493.00) Assessment: Unchanged  stable extrinsic asthma,  upper airway instability and vocal cord disorder, all stable plan no change in medication observation for now  Medications Added to Medication List This Visit: 1)  Klor-con 10 10 Meq Cr-tabs (Potassium chloride) .... 2 tabs once daily 2)  Vitamin D3 1000 Unit Tabs  (Cholecalciferol) .... Take 1 tablet by mouth once a day 3)  Nasonex 50 Mcg/act Susp (Mometasone furoate) .... Two puffs each nostril two times a day  Complete Medication List: 1)  Allegra 180 Mg Tabs (Fexofenadine hcl) .[name].. 1 by mouth daily 2)  Alprazolam 0.5 Mg Tabs (Alprazolam) .... 1/2 two times a day 3)  Adult Aspirin Ec Low Strength 81 Mg Tbec (Aspirin) .[name].. 1 by mouth daily 4)  Boniva 150 Mg Tabs (Ibandronate sodium) .[name].. 1 monthly 5)  Caltrate 600+d 600-400 Mg-unit Tabs (Calcium carbonate-vitamin d) .[name].. 1 by mouth two times a day 6)  Crestor 5 Mg Tabs (Rosuvastatin calcium) .[name].. 1 by mouth daily 7)  Nexium 40 Mg Cpdr (Esomeprazole magnesium) .... One by mouth two times a day 8)  Klor-con 10 10 Meq Cr-tabs (Potassium chloride) .... 2 tabs once daily 9)  Pramipexole Dihydrochloride 0.125 Mg Tabs (Pramipexole dihydrochloride) .... 3 tabs at bedtime 10)  Symbicort 160-4.5 Mcg/act Aero (Budesonide-formoterol fumarate) .... 2 puffs twice daily  11)  Xolair 150 Mg Solr (Omalizumab) .... 300mg  subcutaneously every 2 weeks 12)  Tessalon Perles 100 Mg Caps (Benzonatate) .... One to two by mouth q8h as needed 13)  Hydrochlorothiazide 25 Mg Tabs (Hydrochlorothiazide) .... One  by mouth daily 14)  Singulair 10 Mg Tabs (Montelukast sodium) .[name].. 1 by mouth at bedtime 15)  Aerochamber Mv Misc (Spacer/aero-holding chambers) .... Use with symbicort 16)  Tussicaps 10-8 Mg Xr12h-cap (Hydrocod polst-chlorphen polst) .... Two times a day as needed 17)  Vitamin D3 1000 Unit Tabs (Cholecalciferol) .... Take 1 tablet by mouth once a day 18)  B-12 500 Mcg Tabs (Cyanocobalamin) .[name].. 1 by mouth daily 19)  Proair Hfa 108 (90 Base) Mcg/act Aers (Albuterol sulfate) .[name].. 1 -2 puffs every 4 hours as needed 20)  Micardis 40 Mg Tabs (Telmisartan) .[name].. 1 once daily 21)  Benefiber Tabs (Wheat dextrin) .... As needed 22)  Nasonex 50 Mcg/act Susp (Mometasone furoate) .... Two puffs each nostril two times a day 23)   Astelin 137 Mcg/spray Soln (Azelastine hcl) .... Two sprays each nostril two times a day  Other Orders: Est. Patient Level III (16109)  Patient Instructions: 1)  No change in medications 2)  Return in     2     months    Contraindications/Deferment of Procedures/Staging:    Treatment: Flu Shot    Contraindication: adverse rxn/side effects   Prevention & Chronic Care Immunizations   Influenza vaccine: Not documented    Tetanus booster: Not documented    Pneumococcal vaccine: Pneumovax  (01/31/2009)    H. zoster vaccine: Not documented  Colorectal Screening   Hemoccult: Not documented    Colonoscopy: Not documented  Other Screening   Pap smear: Not documented    Mammogram: Not documented    DXA bone density scan: Not documented   Smoking status: never  (01/07/2010)  Lipids   Total Cholesterol: Not documented   LDL: Not documented   LDL Direct: Not documented   HDL: Not documented   Triglycerides: Not documented  Hypertension   Last Blood Pressure: 142 / 70  (03/08/2010)   Serum creatinine: Not documented   Serum potassium Not documented  Self-Management Support :    Hypertension self-management support: Not documented

## 2010-06-11 NOTE — Assessment & Plan Note (Signed)
Summary: Geoffry Paradise ///kp   Nurse Visit   Allergies: 1)  ! Levaquin 2)  ! Penicillin 3)  ! Codeine 4)  ! Cephalosporins 5)  ! * Eggs  Medication Administration  Injection # 1:    Medication: Xolair (omalizumab) 150mg     Diagnosis: 493.00    Route: SQ    Site: L deltoid    Exp Date: 02/2012    Lot #: [id number]    Mfr: [employer]    Comments: 1.2 ML IN LEFT AND RIGHT ARM CHARGED [phone] AND [phone]    Given by: Dimas Millin IN [facility]   Medication Administration  Injection # 1:    Medication: Xolair (omalizumab) 150mg     Diagnosis: 493.00    Route: SQ    Site: L deltoid    Exp Date: 02/2012    Lot #: [id number]    Mfr: [employer]    Comments: 1.2 ML IN LEFT AND RIGHT ARM CHARGED [id number] AND [id number]    Given by: Dimas Millin IN [facility]

## 2010-06-11 NOTE — Assessment & Plan Note (Signed)
Summary: Pulmonary OV   Primary Provider/Referring Provider:  Rodrigo Ran  CC:  1 month follow up.  Pt was in [facility] from 2/15-2/21 for pna.  states breathing is doing "good" since discharged from the hospital.  only has needed to use rescue inhaler twice since discharge.  states she is coughing "very little"-dry.  denies wheezing and chest tightness.  [name]  History of Present Illness:  77 year old, white female, with  allergic rhinitis, asthmatic bronchitis, reflux disease, and vocal cord dysfunction.   July 06, 2009 9:01 AM Was hosp for PNA LL.  Asthma exac.  Pain in LLL.  sharp stabbing pain. No PE on CT Scan 2/15-2/21.  Since d/c is tired.  Pain is better.  rx omniceph and pred .  Had a yeast infxn. No real cough.  No real wheeze.    No mucous now.    Asthma History    Asthma Control Assessment:    Age range: 12+ years    Symptoms: 0-2 days/week    Nighttime Awakenings: 1-3/week    Interferes w/ normal activity: some limitations    SABA use (not for EIB): >2 days/week    ATAQ questionnaire: 1-2    Exacerbations requiring oral systemic steroids: 0-1/year    Asthma Control Assessment: Not Well Controlled   Preventive Screening-Counseling & Management  Alcohol-Tobacco     Smoking Status: never  Current Medications (verified): 1)  Allegra 180 Mg  Tabs (Fexofenadine Hcl) .[name].. 1 By Mouth Daily 2)  Alprazolam 0.5 Mg  Tabs (Alprazolam) .... 1/2 Two Times A Day 3)  Adult Aspirin Ec Low Strength 81 Mg  Tbec (Aspirin) .[name].. 1 By Mouth Daily 4)  Boniva 150 Mg  Tabs (Ibandronate Sodium) .[name].. 1 Monthly 5)  Caltrate 600+d 600-400 Mg-Unit  Tabs (Calcium Carbonate-Vitamin D) .[name].. 1 By Mouth Two Times A Day *hold For 2 Wks-Will Restart in March * 6)  Crestor 5 Mg  Tabs (Rosuvastatin Calcium) .[name].. 1 By Mouth Daily 7)  Nasonex 50 Mcg/act  Susp (Mometasone Furoate) .... Two Puff Ea Nostril Two Times A Day 8)  Nexium 40 Mg  Cpdr (Esomeprazole Magnesium) .... One By Mouth Two Times A Day 9)   Klor-Con 10 10 Meq Cr-Tabs (Potassium Chloride) .... Once Daily 10)  Pramipexole Dihydrochloride 0.125 Mg Tabs (Pramipexole Dihydrochloride) .... 3 Tabs At Bedtime 11)  Symbicort 160-4.5 Mcg/act  Aero (Budesonide-Formoterol Fumarate) .... 2 Puffs Twice Daily 12)  Xolair 150 Mg  Solr (Omalizumab) .... 300mg  Subcutaneously Every 2 Weeks 13)  Tessalon Perles 100 Mg  Caps (Benzonatate) .... One To Two By Mouth Q8h As Needed 14)  Hydrochlorothiazide 25 Mg  Tabs (Hydrochlorothiazide) .... One Half  By Mouth Daily 15)  Singulair 10 Mg Tabs (Montelukast Sodium) .[name].. 1 By Mouth At Bedtime 16)  Aerochamber Mv   Misc (Spacer/aero-Holding Chambers) .... Use With Symbicort and Rescue Inhaler 17)  Albuterol Sulfate (2.5 Mg/1ml) 0.083%  Nebu (Albuterol Sulfate) .... Inhale 1 Vial Via Hhn Every 4-6 Hrs As Needed For Shortness of Breath Upto 4 Times Daily 18)  Tussicaps 10-8 Mg Xr12h-Cap (Hydrocod Polst-Chlorphen Polst) .... Two Times A Day As Needed 19)  Vitamin D 1000 Unit Tabs (Cholecalciferol) .... Once Daily 20)  B-12 500 Mcg Tabs (Cyanocobalamin) .[name].. 1 By Mouth Daily 21)  Astelin 137 Mcg/spray Soln (Azelastine Hcl) .... 2 Sprays Each Nostril Two Times A Day 22)  Proair Hfa 108 (90 Base) Mcg/act Aers (Albuterol Sulfate) .[name].. 1 -2 Puffs Every 4 Hours As Needed 23)  Micardis 40  Mg Tabs (Telmisartan) .[name].. 1 Once Daily 24)  Prednisone 10 Mg  Tabs (Prednisone) .... Take As Directed 4 Each Am X3days, 3 X 3days, 2 X 3days, 1 X 3days Then Stop 25)  Cefdinir 300 Mg Caps (Cefdinir) .... Two Times A Day Until Gone  Allergies (verified): 1)  ! Levaquin 2)  ! Penicillin 3)  ! Codeine 4)  ! Cephalosporins 5)  ! * Eggs  Past History:  Past medical, surgical, family and social histories (including risk factors) reviewed, and no changes noted (except as noted below).  Past Medical History: Reviewed history from 01/13/2008 and no changes required. Current Problems:  RESTLESS LEGS SYNDROME  (ICD-333.94) OSTEOPOROSIS (ICD-733.00) HYPERTENSION (ICD-401.9) VOCAL CORD DISORDER (ICD-478.5) ASTHMA (ICD-493.90)     -FeV1 88% 2007 GERD (ICD-530.81) ALLERGIC RHINITIS (ICD-477.9) BRONCHITIS, OBSTRUCTIVE CHRONIC W/O EXACRB (ICD-491.20)  Family History: Reviewed history from 04/30/2007 and no changes required. Breast Cancer MI/Heart Attack  Social History: Reviewed history from 04/30/2007 and no changes required. Patient never smoked.   Review of Systems       The patient complains of shortness of breath with activity and non-productive cough.  The patient denies shortness of breath at rest, productive cough, coughing up blood, chest pain, irregular heartbeats, acid heartburn, indigestion, loss of appetite, weight change, abdominal pain, difficulty swallowing, sore throat, tooth/dental problems, headaches, nasal congestion/difficulty breathing through nose, sneezing, itching, ear ache, anxiety, depression, hand/feet swelling, joint stiffness or pain, rash, change in color of mucus, and fever.    Vital Signs:  Patient profile:   77 year old female Height:      65 inches Weight:      177.50 pounds BMI:     29.64 O2 Sat:      98 % on Room air Temp:     97.5 degrees F oral Pulse rate:   83 / minute BP sitting:   132 / 74  (left arm) Cuff size:   regular  Vitals Entered By: Gweneth Dimitri RN (July 06, 2009 8:59 AM)  O2 Flow:  Room air CC: 1 month follow up.  Pt was in [facility] from 2/15-2/21 for pna.  states breathing is doing "good" since discharged from the hospital.  only has needed to use rescue inhaler twice since discharge.  states she is coughing "very little"-dry.  denies wheezing and chest tightness.   Comments Medications reviewed with patient Daytime contact number verified with patient. Gweneth Dimitri RN  July 06, 2009 8:53 AM    Physical Exam  Additional Exam:  Gen: Pleasant, well nourished, in no distress ENT: R>L nares purulence and erythema with  postnasal drip Neck: No JVD, no TMG, no carotid bruits Lungs: no use of accessory muscles, no dullness to percussion, few exp wheezes Cardiovascular: RRR, heart sounds normal, no murmurs or gallops, no peripheral edema Musculoskeletal: No deformities, no cyanosis or clubbing     CXR  Procedure date:  07/06/2009  Findings:      IMPRESSION: Improved to resolved left lower lobe pneumonia.  There is minimal residual airspace disease which is favored to represent atelectasis or scar.  This is similar back to 09/16/2005.    Impression & Recommendations:  Problem # 1:  PNEUMONIA, LEFT LOWER LOBE (ICD-481) Assessment Improved LLL PNA improved with treatment,  clearing on CXR plan finish current rx course of pred/omniceph The following medications were removed from the medication list:    Zithromax Z-pak 250 Mg Tabs (Azithromycin) .[name]... As directed  please cancel levaquin rx already sent, pt is  allergic Her updated medication list for this problem includes:    Cefdinir 300 Mg Caps (Cefdinir) .[name]..[name] Two times a day until gone  Orders: Est. Patient Level IV (99214) T-2 View CXR (71020TC)  Problem # 2:  EXTRINSIC ASTHMA, UNSPECIFIED (ICD-493.00) Assessment: Unchanged moderate persistent asthma and VCD plan hold xolair injection until two weeks have passed No change in inhaled medications.   Maintain treatment program as currently prescribed.  Medications Added to Medication List This Visit: 1)  Caltrate 600+d 600-400 Mg-unit Tabs (Calcium carbonate-vitamin d) .[name].. 1 by mouth two times a day *hold for 2 wks-will restart in march * 2)  Klor-con 10 10 Meq Cr-tabs (Potassium chloride) .... Once daily 3)  Pramipexole Dihydrochloride 0.125 Mg Tabs (Pramipexole dihydrochloride) .... 3 tabs at bedtime 4)  Aerochamber Mv Misc (Spacer/aero-holding chambers) .... Use with symbicort and rescue inhaler 5)  Cefdinir 300 Mg Caps (Cefdinir) .... Two times a day until gone  Complete Medication  List: 1)  Allegra 180 Mg Tabs (Fexofenadine hcl) .[name].. 1 by mouth daily 2)  Alprazolam 0.5 Mg Tabs (Alprazolam) .... 1/2 two times a day 3)  Adult Aspirin Ec Low Strength 81 Mg Tbec (Aspirin) .[name].. 1 by mouth daily 4)  Boniva 150 Mg Tabs (Ibandronate sodium) .[name].. 1 monthly 5)  Caltrate 600+d 600-400 Mg-unit Tabs (Calcium carbonate-vitamin d) .[name].. 1 by mouth two times a day *hold for 2 wks-will restart in march * 6)  Crestor 5 Mg Tabs (Rosuvastatin calcium) .[name].. 1 by mouth daily 7)  Nasonex 50 Mcg/act Susp (Mometasone furoate) .... Two puff ea nostril two times a day 8)  Nexium 40 Mg Cpdr (Esomeprazole magnesium) .... One by mouth two times a day 9)  Klor-con 10 10 Meq Cr-tabs (Potassium chloride) .... Once daily 10)  Pramipexole Dihydrochloride 0.125 Mg Tabs (Pramipexole dihydrochloride) .... 3 tabs at bedtime 11)  Symbicort 160-4.5 Mcg/act Aero (Budesonide-formoterol fumarate) .... 2 puffs twice daily 12)  Xolair 150 Mg Solr (Omalizumab) .... 300mg  subcutaneously every 2 weeks 13)  Tessalon Perles 100 Mg Caps (Benzonatate) .... One to two by mouth q8h as needed 14)  Hydrochlorothiazide 25 Mg Tabs (Hydrochlorothiazide) .... One half  by mouth daily 15)  Singulair 10 Mg Tabs (Montelukast sodium) .[name].. 1 by mouth at bedtime 16)  Aerochamber Mv Misc (Spacer/aero-holding chambers) .... Use with symbicort and rescue inhaler 17)  Albuterol Sulfate (2.5 Mg/44ml) 0.083% Nebu (Albuterol sulfate) .... Inhale 1 vial via hhn every 4-6 hrs as needed for shortness of breath upto 4 times daily 18)  Tussicaps 10-8 Mg Xr12h-cap (Hydrocod polst-chlorphen polst) .... Two times a day as needed 19)  Vitamin D 1000 Unit Tabs (Cholecalciferol) .... Once daily 20)  B-12 500 Mcg Tabs (Cyanocobalamin) .[name].. 1 by mouth daily 21)  Astelin 137 Mcg/spray Soln (Azelastine hcl) .... 2 sprays each nostril two times a day 22)  Proair Hfa 108 (90 Base) Mcg/act Aers (Albuterol sulfate) .[name].. 1 -2 puffs every 4 hours as needed 23)   Micardis 40 Mg Tabs (Telmisartan) .[name].. 1 once daily 24)  Prednisone 10 Mg Tabs (Prednisone) .... Take as directed 4 each am x3days, 3 x 3days, 2 x 3days, 1 x 3days then stop 25)  Cefdinir 300 Mg Caps (Cefdinir) .... Two times a day until gone  Patient Instructions: 1)  A chest xray is obtained 2)  Finish antibiotic and prednisone 3)  Return two weeks    Appended Document: Pulmonary OV fax [employer]

## 2010-06-13 NOTE — Assessment & Plan Note (Signed)
Summary: Pulmonary OV   Primary Provider/Referring Provider:  Rodrigo Ran  CC:  2 month follow up.  c/o PND and prod cough with clear mucus.  Cough seems worse at night and when using voice.  States she does have SOB at times but not often.  Would like to discuss taking xyzal in place of allegra..  History of Present Illness:  77 year old, white female, with  allergic rhinitis, asthmatic bronchitis, reflux disease, and vocal cord dysfunction.     April 19, 2010 9:28 AM Noting more coughing and drainage.  Mucus is clear.  Cough is prod clear.  No f/c/s.  Notes more wheeze.  Notes pn drip.  Saw Sharma and he rx xyzal.  Pt on allegra/astelin/ nasal steroid No chest pain .    Asthma History    Asthma Control Assessment:    Age range: 12+ years    Symptoms: 0-2 days/week    Nighttime Awakenings: 0-2/month    Interferes w/ normal activity: some limitations    SABA use (not for EIB): 0-2 days/week    ATAQ questionnaire: 0    Exacerbations requiring oral systemic steroids: 0-1/year    Asthma Control Assessment: Not Well Controlled   Current Medications (verified): 1)  Allegra 180 Mg  Tabs (Fexofenadine Hcl) .[name].. 1 By Mouth Daily 2)  Alprazolam 0.5 Mg  Tabs (Alprazolam) .... 1/2 Two Times A Day 3)  Adult Aspirin Ec Low Strength 81 Mg  Tbec (Aspirin) .[name].. 1 By Mouth Daily 4)  Boniva 150 Mg  Tabs (Ibandronate Sodium) .[name].. 1 Monthly 5)  Caltrate 600+d 600-400 Mg-Unit  Tabs (Calcium Carbonate-Vitamin D) .[name].. 1 By Mouth Two Times A Day 6)  Crestor 5 Mg  Tabs (Rosuvastatin Calcium) .[name].. 1 By Mouth Daily 7)  Nexium 40 Mg  Cpdr (Esomeprazole Magnesium) .... One By Mouth Two Times A Day 8)  Klor-Con 10 10 Meq Cr-Tabs (Potassium Chloride) .... 2 Tabs Once Daily 9)  Pramipexole Dihydrochloride 0.125 Mg Tabs (Pramipexole Dihydrochloride) .... 3 Tabs At Bedtime 10)  Symbicort 160-4.5 Mcg/act  Aero (Budesonide-Formoterol Fumarate) .... 2 Puffs Twice Daily 11)  Xolair 150 Mg  Solr (Omalizumab) ....  300mg  Subcutaneously Every 2 Weeks 12)  Tessalon Perles 100 Mg  Caps (Benzonatate) .... One To Two By Mouth Q8h As Needed 13)  Hydrochlorothiazide 25 Mg  Tabs (Hydrochlorothiazide) .... One  By Mouth Daily 14)  Singulair 10 Mg Tabs (Montelukast Sodium) .[name].. 1 By Mouth At Bedtime 15)  Aerochamber Mv   Misc (Spacer/aero-Holding Chambers) .... Use With Symbicort 16)  Tussicaps 10-8 Mg Xr12h-Cap (Hydrocod Polst-Chlorphen Polst) .... Two Times A Day As Needed 17)  Vitamin D3 1000 Unit Tabs (Cholecalciferol) .... Take 1 Tablet By Mouth Once A Day 18)  B-12 500 Mcg Tabs (Cyanocobalamin) .[name].. 1 By Mouth Daily 19)  Proair Hfa 108 (90 Base) Mcg/act Aers (Albuterol Sulfate) .[name].. 1 -2 Puffs Every 4 Hours As Needed 20)  Micardis 40 Mg Tabs (Telmisartan) .[name].. 1 Once Daily 21)  Benefiber  Tabs (Wheat Dextrin) .... As Needed 22)  Nasonex 50 Mcg/act  Susp (Mometasone Furoate) .... Two Puffs Each Nostril Two Times A Day 23)  Astelin 137 Mcg/spray Soln (Azelastine Hcl) .... Two Sprays Each Nostril Two Times A Day  Allergies (verified): 1)  ! Levaquin 2)  ! Penicillin 3)  ! Codeine 4)  ! Cephalosporins 5)  ! * Eggs  Past History:  Past medical, surgical, family and social histories (including risk factors) reviewed, and no changes noted (except as noted  below).  Past Medical History: Reviewed history from 01/13/2008 and no changes required. Current Problems:  RESTLESS LEGS SYNDROME (ICD-333.94) OSTEOPOROSIS (ICD-733.00) HYPERTENSION (ICD-401.9) VOCAL CORD DISORDER (ICD-478.5) ASTHMA (ICD-493.90)     -FeV1 88% 2007 GERD (ICD-530.81) ALLERGIC RHINITIS (ICD-477.9) BRONCHITIS, OBSTRUCTIVE CHRONIC W/O EXACRB (ICD-491.20)  Family History: Reviewed history from 04/30/2007 and no changes required. Breast Cancer MI/Heart Attack  Social History: Reviewed history from 04/30/2007 and no changes required. Patient never smoked.   Review of Systems       The patient complains of shortness of breath  with activity, nasal congestion/difficulty breathing through nose, and sneezing.  The patient denies shortness of breath at rest, productive cough, non-productive cough, coughing up blood, chest pain, irregular heartbeats, acid heartburn, indigestion, loss of appetite, weight change, abdominal pain, difficulty swallowing, sore throat, tooth/dental problems, headaches, itching, ear ache, anxiety, depression, hand/feet swelling, joint stiffness or pain, rash, change in color of mucus, and fever.    Vital Signs:  Patient profile:   77 year old female Height:      65 inches Weight:      174.13 pounds BMI:     29.08 O2 Sat:      97 % on Room air Temp:     98.2 degrees F oral Pulse rate:   86 / minute BP sitting:   114 / 60  (left arm) Cuff size:   regular  Vitals Entered By: Gweneth Dimitri RN (April 19, 2010 9:17 AM)  O2 Flow:  Room air CC: 2 month follow up.  c/o PND and prod cough with clear mucus.  Cough seems worse at night and when using voice.  States she does have SOB at times but not often.  Would like to discuss taking xyzal in place of allegra. Comments Medications reviewed with patient Daytime contact number verified with patient. Gweneth Dimitri RN  April 19, 2010 9:20 AM    Physical Exam  Additional Exam:  Gen: Pleasant, well nourished, in no distress ENT: nares with   erythema and  with postnasal drip Neck: No JVD, no TMG, no carotid bruits Lungs: no use of accessory muscles, no dullness to percussion, no wheezes Cardiovascular: RRR, heart sounds normal, no murmurs or gallops, no peripheral edema Musculoskeletal: No deformities, no cyanosis or clubbing     Impression & Recommendations:  Problem # 1:  GERD (ICD-530.81) cotn ppi Her updated medication list for this problem includes:    Nexium 40 Mg Cpdr (Esomeprazole magnesium) ..... One by mouth two times a day  Problem # 2:  ALLERGIC RHINITIS (ICD-477.9) Assessment: Deteriorated allergic rhinitis with  flare plan d/c astelin start astepro cont nasonex d/c allergra start chlorpheniramine  depomedrol injection Her updated medication list for this problem includes:    Chlorpheniramine Maleate 12 Mg Cr-tabs (Chlorpheniramine maleate) ..... One by mouth two times a day    Nasonex 50 Mcg/act Susp (Mometasone furoate) .[name]..[name] Two puffs each nostril two times a day    Astelin 137 Mcg/spray Soln (Azelastine hcl) .[name]..[name] Two sprays each nostril two times a day    Astepro 0.15 % Soln (Azelastine hcl) .[name]..[name] Two sprays each nostril daily failed astelin  Orders: Est. Patient Level IV ([address])  Medications Added to Medication List This Visit: 1)  Chlorpheniramine Maleate 12 Mg Cr-tabs (Chlorpheniramine maleate) .... One by mouth two times a day 2)  Astepro 0.15 % Soln (Azelastine hcl) .... Two sprays each nostril daily failed astelin  Complete Medication List: 1)  Chlorpheniramine Maleate 12 Mg Cr-tabs (  Chlorpheniramine maleate) .... One by mouth two times a day 2)  Alprazolam 0.5 Mg Tabs (Alprazolam) .... 1/2 two times a day 3)  Adult Aspirin Ec Low Strength 81 Mg Tbec (Aspirin) .[name].. 1 by mouth daily 4)  Boniva 150 Mg Tabs (Ibandronate sodium) .[name].. 1 monthly 5)  Caltrate 600+d 600-400 Mg-unit Tabs (Calcium carbonate-vitamin d) .[name].. 1 by mouth two times a day 6)  Crestor 5 Mg Tabs (Rosuvastatin calcium) .[name].. 1 by mouth daily 7)  Nexium 40 Mg Cpdr (Esomeprazole magnesium) .... One by mouth two times a day 8)  Klor-con 10 10 Meq Cr-tabs (Potassium chloride) .... 2 tabs once daily 9)  Pramipexole Dihydrochloride 0.125 Mg Tabs (Pramipexole dihydrochloride) .... 3 tabs at bedtime 10)  Symbicort 160-4.5 Mcg/act Aero (Budesonide-formoterol fumarate) .... 2 puffs twice daily 11)  Xolair 150 Mg Solr (Omalizumab) .... 300mg  subcutaneously every 2 weeks 12)  Tessalon Perles 100 Mg Caps (Benzonatate) .... One to two by mouth q8h as needed 13)  Hydrochlorothiazide 25 Mg Tabs (Hydrochlorothiazide) .... One  by  mouth daily 14)  Singulair 10 Mg Tabs (Montelukast sodium) .[name].. 1 by mouth at bedtime 15)  Aerochamber Mv Misc (Spacer/aero-holding chambers) .... Use with symbicort 16)  Tussicaps 10-8 Mg Xr12h-cap (Hydrocod polst-chlorphen polst) .... Two times a day as needed 17)  Vitamin D3 1000 Unit Tabs (Cholecalciferol) .... Take 1 tablet by mouth once a day 18)  B-12 500 Mcg Tabs (Cyanocobalamin) .[name].. 1 by mouth daily 19)  Proair Hfa 108 (90 Base) Mcg/act Aers (Albuterol sulfate) .[name].. 1 -2 puffs every 4 hours as needed 20)  Micardis 40 Mg Tabs (Telmisartan) .[name].. 1 once daily 21)  Benefiber Tabs (Wheat dextrin) .... As needed 22)  Nasonex 50 Mcg/act Susp (Mometasone furoate) .... Two puffs each nostril two times a day 23)  Astelin 137 Mcg/spray Soln (Azelastine hcl) .... Two sprays each nostril two times a day 24)  Astepro 0.15 % Soln (Azelastine hcl) .... Two sprays each nostril daily failed astelin  Other Orders: Depo- Medrol 40mg  (J1030) Depo- Medrol 80mg  (J1040) Admin of Therapeutic Inj  intramuscular or subcutaneous (76283)  Patient Instructions: 1)  Stop astelin and allegra 2)  Try chlorpheniramine 12mg  two times a day  3)  Try astepro two sprays ea nostril daily 4)  A depomedrol 120mg  IM was given 5)  No other medication changes 6)  Return 2 months Prescriptions: ASTEPRO 0.15 % SOLN (AZELASTINE HCL) two sprays each nostril daily failed astelin  #1 x 6   Entered and Authorized by:   Storm Frisk MD   Signed by:   Storm Frisk MD on 04/19/2010   Method used:   [facility]        [facility]  [place] [address] [facility]       [address]       [place], [place]  [address]       Ph: [phone] or [phone]       Fax: [phone]   RxID:   [id number] CHLORPHENIRAMINE MALEATE 12 MG CR-TABS (CHLORPHENIRAMINE MALEATE) one by mouth two times a day  #60 x 6   Entered and Authorized by:   Storm Frisk MD   Signed by:   Storm Frisk MD on 04/19/2010   Method used:    [facility]        [facility]  [place] [address] [phone][facility] (retail[facility]       [address]  [place], [place]  [address]       Ph: [phone] or [phone]       Fax: [phone]   RxID:   [id number]      Medication Administration  Injection # 1:    Medication: Depo- Medrol 40mg     Diagnosis: BRONCHITIS, OBSTRUCTIVE CHRONIC W/O EXACRB (ICD-491.20)    Route: IM    Site: LUOQ gluteus    Exp Date: 09/2012    Lot #: [id number]    Mfr: [employer]    Patient tolerated injection without complications    Given by: Gweneth Dimitri RN (April 19, 2010 9:42 AM)  Injection # 2:    Medication: Depo- Medrol 80mg     Diagnosis: BRONCHITIS, OBSTRUCTIVE CHRONIC W/O EXACRB (ICD-491.20)    Route: IM    Site: LUOQ gluteus    Exp Date: 09/2012    Lot #: [id number]    Mfr: [employer]    Patient tolerated injection without complications    Given by: Gweneth Dimitri RN (April 19, 2010 9:42 AM)  Orders Added: 1)  Est. Patient Level IV [74259] 2)  Depo- Medrol 40mg  [J1030] 3)  Depo- Medrol 80mg  [J1040] 4)  Admin of Therapeutic Inj  intramuscular or subcutaneous [96372]   Appended Document: Pulmonary OV fax mark perini

## 2010-06-13 NOTE — Assessment & Plan Note (Signed)
Summary: Xolair//jwr   Nurse Visit   Allergies: 1)  ! Levaquin 2)  ! Penicillin 3)  ! Codeine 4)  ! Cephalosporins 5)  ! * Eggs  Medication Administration  Injection # 1:    Medication: Xolair (omalizumab) 150mg     Diagnosis: EXTRINSIC ASTHMA, UNSPECIFIED (ICD-493.00)    Route: SQ    Site: L deltoid    Exp Date: 05/2013    Lot #: [id number]    Mfr: [employer]    Comments: 1.2 ML IN LEFT AND RIGHT ARM 300MG  CHARGED [id number] AND [id number]    Patient tolerated injection without complications    Given by: TAMMY SCOTT IN [facility]  Orders Added: 1)  Xolair (omalizumab) 150mg  [J2357] 2)  Administration xolair injection [98119]   Medication Administration  Injection # 1:    Medication: Xolair (omalizumab) 150mg     Diagnosis: EXTRINSIC ASTHMA, UNSPECIFIED (ICD-493.00)    Route: SQ    Site: L deltoid    Exp Date: 05/2013    Lot #: [id number]    Mfr: [employer]    Comments: 1.2 ML IN LEFT AND RIGHT ARM 300MG  CHARGED [id number] AND 56213    Patient tolerated injection without complications    Given by: TAMMY SCOTT IN [facility]  Orders Added: 1)  Xolair (omalizumab) 150mg  [J2357] 2)  Administration xolair injection [08657]

## 2010-06-13 NOTE — Progress Notes (Signed)
Summary: tussicaps rx  Phone Note Refill Request Message from:  Pharmacy [facility] on Apr 23, 2010 14:43  Refills Requested: Medication #1:  TUSSICAPS 10-8 MG XR12H-CAP two times a day as needed   Supply Requested: 1 month   Last Refilled: 04/07/2010  Follow-up for Phone Call        Rx last sent on 11/09/2009 #60 x 3 Pls advise if rx ok.  If so, how many refills.  Thanks!  Follow-up by: Gweneth Dimitri RN,  April 23, 2010 5:24 PM  Additional Follow-up for Phone Call Additional follow up Details #1::        ok to refill x 4 Additional Follow-up by: Storm Frisk MD,  April 23, 2010 5:28 PM    Additional Follow-up for Phone Call Additional follow up Details #2::    refill sent. Carron Curie CMA  April 24, 2010 9:29 AM   Prescriptions: TUSSICAPS 10-8 MG XR12H-CAP (HYDROCOD POLST-CHLORPHEN POLST) two times a day as needed  #60 x 4   Entered by:   Carron Curie CMA   Authorized by:   Storm Frisk MD   Signed by:   Carron Curie CMA on 04/24/2010   Method used:   Telephoned to ...       [facility]  [place] [address] (retail)       [address]       [place], [place]  [address]       Ph: [phone] or [phone]       Fax: [phone]   RxID:   [id number]

## 2010-06-13 NOTE — Assessment & Plan Note (Signed)
Summary: [facility]   Nurse Visit   Allergies: 1)  ! Levaquin 2)  ! Penicillin 3)  ! Codeine 4)  ! Cephalosporins 5)  ! * Eggs  Medication Administration  Injection # 1:    Medication: Xolair (omalizumab) 150mg     Diagnosis: EXTRINSIC ASTHMA, UNSPECIFIED (ICD-493.00)    Route: SQ    Site: L deltoid    Exp Date: 05/2013    Lot #: [id number]    Mfr: [employer]    Comments: 1.2 ML IN LEFT AND RIGHT ARM 300MG  CHARGED [id number] AND 62130    Patient tolerated injection without complications    Given by: TAMMY SCOTT IN [facility]  Orders Added: 1)  Xolair (omalizumab) 150mg  [J2357] 2)  Administration xolair injection [id number]   Medication Administration  Injection # 1:    Medication: Xolair (omalizumab) 150mg     Diagnosis: EXTRINSIC ASTHMA, UNSPECIFIED (ICD-493.00)    Route: SQ    Site: L deltoid    Exp Date: 05/2013    Lot #: [id number]    Mfr: [employer]    Comments: 1.2 ML IN LEFT AND RIGHT ARM 300MG  CHARGED [id number] AND 69629    Patient tolerated injection without complications    Given by: TAMMY SCOTT IN [facility]  Orders Added: 1)  Xolair (omalizumab) 150mg  [J2357] 2)  Administration xolair injection [52841]

## 2010-06-13 NOTE — Assessment & Plan Note (Signed)
Summary: xolair/mhh   Nurse Visit   Allergies: 1)  ! Levaquin 2)  ! Penicillin 3)  ! Codeine 4)  ! Cephalosporins 5)  ! * Eggs  Medication Administration  Injection # 1:    Medication: Xolair (omalizumab) 150mg     Diagnosis: EXTRINSIC ASTHMA, UNSPECIFIED (ICD-493.00)    Route: SQ    Site: R deltoid    Exp Date: 05/2013    Lot #: [id number]    Mfr: [employer]    Comments: 1.2 ML IN RIGHT AND LEFT ARM 300MG  CHARGED [id number] AND 62130    Patient tolerated injection without complications    Given by: TAMMY SCOTT IN [facility]  Orders Added: 1)  Xolair (omalizumab) 150mg  [J2357] 2)  Administration xolair injection [[id number]]   Medication Administration  Injection # 1:    Medication: Xolair (omalizumab) 150mg     Diagnosis: EXTRINSIC ASTHMA, UNSPECIFIED (ICD-493.00)    Route: SQ    Site: R deltoid    Exp Date: 05/2013    Lot #: [id number]    Mfr: [employer]    Comments: 1.2 ML IN RIGHT AND LEFT ARM 300MG  CHARGED [id number] AND 52841    Patient tolerated injection without complications    Given by: TAMMY SCOTT IN [facility]  Orders Added: 1)  Xolair (omalizumab) 150mg  [J2357] 2)  Administration xolair injection [32440]

## 2010-06-13 NOTE — Letter (Signed)
[name]   Imported By: Lennie Odor 05/24/2010 12:19:04  _____________________________________________________________________  External Attachment:    Type:   Image     Comment:   External Document

## 2010-06-19 ENCOUNTER — Ambulatory Visit (INDEPENDENT_AMBULATORY_CARE_PROVIDER_SITE_OTHER): Payer: Medicare Other | Admitting: Critical Care Medicine

## 2010-06-19 ENCOUNTER — Encounter: Payer: Self-pay | Admitting: Critical Care Medicine

## 2010-06-19 DIAGNOSIS — J383 Other diseases of vocal cords: Secondary | ICD-10-CM

## 2010-06-19 DIAGNOSIS — K219 Gastro-esophageal reflux disease without esophagitis: Secondary | ICD-10-CM

## 2010-06-19 DIAGNOSIS — J45909 Unspecified asthma, uncomplicated: Secondary | ICD-10-CM

## 2010-06-19 NOTE — Assessment & Plan Note (Signed)
Summary: xolair/mhh   Nurse Visit   Allergies: 1)  ! Levaquin 2)  ! Penicillin 3)  ! Codeine 4)  ! Cephalosporins 5)  ! * Eggs  Medication Administration  Injection # 1:    Medication: Xolair (omalizumab) 150mg     Diagnosis: EXTRINSIC ASTHMA, UNSPECIFIED (ICD-493.00)    Route: SQ    Site: R deltoid    Exp Date: 05/2013    Lot #: [id number]    Mfr: [employer]    Comments: 1.2 ML IN RIGHT AND LEFT ARM 300MG  CHARGED [id number] AND 86578    Patient tolerated injection without complications    Given by: TAMMY SCOTT IN [facility]  Orders Added: 1)  Xolair (omalizumab) 150mg  [J2357] 2)  Administration xolair injection [46962]   Medication Administration  Injection # 1:    Medication: Xolair (omalizumab) 150mg     Diagnosis: EXTRINSIC ASTHMA, UNSPECIFIED (ICD-493.00)    Route: SQ    Site: R deltoid    Exp Date: 05/2013    Lot #: [id number]    Mfr: [employer]    Comments: 1.2 ML IN RIGHT AND LEFT ARM 300MG  CHARGED [id number] AND 32440    Patient tolerated injection without complications    Given by: TAMMY SCOTT IN [facility]  Orders Added: 1)  Xolair (omalizumab) 150mg  [J2357] 2)  Administration xolair injection [10272]

## 2010-06-25 ENCOUNTER — Encounter: Payer: Self-pay | Admitting: Critical Care Medicine

## 2010-06-25 ENCOUNTER — Ambulatory Visit (INDEPENDENT_AMBULATORY_CARE_PROVIDER_SITE_OTHER): Payer: Medicare Other

## 2010-06-25 DIAGNOSIS — J45909 Unspecified asthma, uncomplicated: Secondary | ICD-10-CM

## 2010-06-27 NOTE — Assessment & Plan Note (Signed)
Summary: Pulmonary OV   Primary Provider/Referring Provider:  Rodrigo Ran  CC:  2 month follow up.  Pt states breathing is unchanged - has good days and bad days.  Coughing spells - rarely prod with clear mucus..  History of Present Illness:  77 year old, white female, with  allergic rhinitis, asthmatic bronchitis, reflux disease, and vocal cord dysfunction.     April 19, 2010 9:28 AM Noting more coughing and drainage.  Mucus is clear.  Cough is prod clear.  No f/c/s.  Notes more wheeze.  Notes pn drip.  Saw Sharma and he rx xyzal.  Pt on allegra/astelin/ nasal steroid No chest pain .    June 19, 2010 10:20 AM Still with cough,  able to handle this.  Used tessalon  and proair to stop the cough. no real chest pain.  no real wheeze. astepro helps chlorpheniramine caused headaches   Asthma History    Asthma Control Assessment:    Age range: 12+ years    Symptoms: >2 days/week    Nighttime Awakenings: 1-3/week    Interferes w/ normal activity: no limitations    SABA use (not for EIB): 0-2 days/week    ATAQ questionnaire: 0    Exacerbations requiring oral systemic steroids: 0-1/year    Asthma Control Assessment: Not Well Controlled   Current Medications (verified): 1)  Fexofenadine Hcl 180 Mg Tabs (Fexofenadine Hcl) .... Take 1 Tablet By Mouth Once A Day 2)  Alprazolam 0.5 Mg  Tabs (Alprazolam) .... 1/2 Two Times A Day 3)  Adult Aspirin Ec Low Strength 81 Mg  Tbec (Aspirin) .[name].. 1 By Mouth Daily 4)  Boniva 150 Mg  Tabs (Ibandronate Sodium) .[name].. 1 Monthly 5)  Caltrate 600+d 600-400 Mg-Unit  Tabs (Calcium Carbonate-Vitamin D) .[name].. 1 By Mouth Two Times A Day 6)  Crestor 5 Mg  Tabs (Rosuvastatin Calcium) .[name].. 1 By Mouth Daily 7)  Nexium 40 Mg  Cpdr (Esomeprazole Magnesium) .... One By Mouth Two Times A Day 8)  Klor-Con 10 10 Meq Cr-Tabs (Potassium Chloride) .... 2 Tabs Once Daily 9)  Pramipexole Dihydrochloride 0.125 Mg Tabs (Pramipexole Dihydrochloride) .... 3 Tabs At  Bedtime 10)  Symbicort 160-4.5 Mcg/act  Aero (Budesonide-Formoterol Fumarate) .... 2 Puffs Twice Daily 11)  Xolair 150 Mg  Solr (Omalizumab) .... 300mg  Subcutaneously Every 2 Weeks 12)  Tessalon Perles 100 Mg  Caps (Benzonatate) .... One To Two By Mouth Q8h As Needed 13)  Hydrochlorothiazide 25 Mg  Tabs (Hydrochlorothiazide) .... One  By Mouth Daily 14)  Singulair 10 Mg Tabs (Montelukast Sodium) .[name].. 1 By Mouth At Bedtime 15)  Aerochamber Mv   Misc (Spacer/aero-Holding Chambers) .... Use With Symbicort 16)  Tussicaps 10-8 Mg Xr12h-Cap (Hydrocod Polst-Chlorphen Polst) .... Two Times A Day As Needed 17)  Vitamin D3 1000 Unit Tabs (Cholecalciferol) .... Take 1 Tablet By Mouth Once A Day 18)  B-12 500 Mcg Tabs (Cyanocobalamin) .[name].. 1 By Mouth Daily 19)  Proair Hfa 108 (90 Base) Mcg/act Aers (Albuterol Sulfate) .[name].. 1 -2 Puffs Every 4 Hours As Needed 20)  Micardis 40 Mg Tabs (Telmisartan) .[name].. 1 Once Daily 21)  Benefiber  Tabs (Wheat Dextrin) .... As Needed 22)  Nasonex 50 Mcg/act  Susp (Mometasone Furoate) .... Two Puffs Each Nostril Two Times A Day 23)  Astelin 137 Mcg/spray Soln (Azelastine Hcl) .... Two Sprays Each Nostril Two Times A Day 24)  Astepro 0.15 % Soln (Azelastine Hcl) .... Two Sprays Each Nostril Daily Failed Astelin  Allergies (verified): 1)  !  Levaquin 2)  ! Penicillin 3)  ! Codeine 4)  ! Cephalosporins 5)  ! * Chlorpheniramine 6)  ! * Eggs  Past History:  Past medical, surgical, family and social histories (including risk factors) reviewed, and no changes noted (except as noted below).  Past Medical History: Reviewed history from 01/13/2008 and no changes required. Current Problems:  RESTLESS LEGS SYNDROME (ICD-333.94) OSTEOPOROSIS (ICD-733.00) HYPERTENSION (ICD-401.9) VOCAL CORD DISORDER (ICD-478.5) ASTHMA (ICD-493.90)     -FeV1 88% 2007 GERD (ICD-530.81) ALLERGIC RHINITIS (ICD-477.9) BRONCHITIS, OBSTRUCTIVE CHRONIC W/O EXACRB (ICD-491.20)  Family  History: Reviewed history from 04/30/2007 and no changes required. Breast Cancer MI/Heart Attack  Social History: Reviewed history from 04/30/2007 and no changes required. Patient never smoked.   Review of Systems       The patient complains of shortness of breath with activity and non-productive cough.  The patient denies shortness of breath at rest, productive cough, coughing up blood, chest pain, irregular heartbeats, acid heartburn, indigestion, loss of appetite, weight change, abdominal pain, difficulty swallowing, sore throat, tooth/dental problems, headaches, nasal congestion/difficulty breathing through nose, sneezing, itching, ear ache, anxiety, depression, hand/feet swelling, joint stiffness or pain, rash, change in color of mucus, and fever.    Vital Signs:  Patient profile:   77 year old female Height:      65 inches Weight:      178.13 pounds BMI:     29.75 O2 Sat:      99 % on Room air Temp:     97.9 degrees F oral Pulse rate:   82 / minute BP sitting:   124 / 90  (left arm) Cuff size:   regular  Vitals Entered By: Gweneth Dimitri RN (June 19, 2010 10:12 AM)  O2 Flow:  Room air CC: 2 month follow up.  Pt states breathing is unchanged - has good days and bad days.  Coughing spells - rarely prod with clear mucus. Comments Medications reviewed with patient Daytime contact number verified with patient. Crystal Jones RN  June 19, 2010 10:14 AM    Physical Exam  Additional Exam:  Gen: Pleasant, well nourished, in no distress ENT: nares with   erythema and  with postnasal drip Neck: No JVD, no TMG, no carotid bruits Lungs: no use of accessory muscles, no dullness to percussion, no wheezes Cardiovascular: RRR, heart sounds normal, no murmurs or gallops, no peripheral edema Musculoskeletal: No deformities, no cyanosis or clubbing     Impression & Recommendations:  Problem # 1:  BRONCHITIS, OBSTRUCTIVE CHRONIC W/O EXACRB (ICD-491.20) Assessment  Improved  severe asthma with fixed airflow obstruction improved plan cont maintainence meds  Orders: Est. Patient Level III (16109)  Medications Added to Medication List This Visit: 1)  Fexofenadine Hcl 180 Mg Tabs (Fexofenadine hcl) .... Take 1 tablet by mouth once a day  Complete Medication List: 1)  Fexofenadine Hcl 180 Mg Tabs (Fexofenadine hcl) .... Take 1 tablet by mouth once a day 2)  Alprazolam 0.5 Mg Tabs (Alprazolam) .... 1/2 two times a day 3)  Adult Aspirin Ec Low Strength 81 Mg Tbec (Aspirin) .[name].. 1 by mouth daily 4)  Boniva 150 Mg Tabs (Ibandronate sodium) .[name].. 1 monthly 5)  Caltrate 600+d 600-400 Mg-unit Tabs (Calcium carbonate-vitamin d) .[name].. 1 by mouth two times a day 6)  Crestor 5 Mg Tabs (Rosuvastatin calcium) .[name].. 1 by mouth daily 7)  Nexium 40 Mg Cpdr (Esomeprazole magnesium) .... One by mouth two times a day 8)  Klor-con 10 10 Meq Cr-tabs (  Potassium chloride) .... 2 tabs once daily 9)  Pramipexole Dihydrochloride 0.125 Mg Tabs (Pramipexole dihydrochloride) .... 3 tabs at bedtime 10)  Symbicort 160-4.5 Mcg/act Aero (Budesonide-formoterol fumarate) .... 2 puffs twice daily 11)  Xolair 150 Mg Solr (Omalizumab) .... 300mg  subcutaneously every 2 weeks 12)  Tessalon Perles 100 Mg Caps (Benzonatate) .... One to two by mouth q8h as needed 13)  Hydrochlorothiazide 25 Mg Tabs (Hydrochlorothiazide) .... One  by mouth daily 14)  Singulair 10 Mg Tabs (Montelukast sodium) .[name].. 1 by mouth at bedtime 15)  Aerochamber Mv Misc (Spacer/aero-holding chambers) .... Use with symbicort 16)  Tussicaps 10-8 Mg Xr12h-cap (Hydrocod polst-chlorphen polst) .... Two times a day as needed 17)  Vitamin D3 1000 Unit Tabs (Cholecalciferol) .... Take 1 tablet by mouth once a day 18)  B-12 500 Mcg Tabs (Cyanocobalamin) .[name].. 1 by mouth daily 19)  Proair Hfa 108 (90 Base) Mcg/act Aers (Albuterol sulfate) .[name].. 1 -2 puffs every 4 hours as needed 20)  Micardis 40 Mg Tabs (Telmisartan) .[name].. 1 once  daily 21)  Benefiber Tabs (Wheat dextrin) .... As needed 22)  Nasonex 50 Mcg/act Susp (Mometasone furoate) .... Two puffs each nostril two times a day 23)  Astelin 137 Mcg/spray Soln (Azelastine hcl) .... Two sprays each nostril two times a day  Patient Instructions: 1)  No change in medications 2)  Use up your astelin then resume astepro 3)  Return in    2      months

## 2010-07-03 NOTE — Assessment & Plan Note (Signed)
Summary: xolair/mhh   Nurse Visit   Allergies: 1)  ! Levaquin 2)  ! Penicillin 3)  ! Codeine 4)  ! Cephalosporins 5)  ! * Chlorpheniramine 6)  ! * Eggs  Medication Administration  Injection # 1:    Medication: Xolair (omalizumab) 150mg     Diagnosis: EXTRINSIC ASTHMA, UNSPECIFIED (ICD-493.00)    Route: SQ    Site: R deltoid    Exp Date: 05/2013    Lot #: [id number]    Mfr: [employer]et[employer]    Comments: 1.2 ML IN RIGHT AND LEFT ARM 300MG  CHARGED [id number] AND [id number]    Given by: Dimas Millin IN [facility]  Orders Added: 1)  Xolair (omalizumab) 150mg  [J2357] 2)  Administration xolair injection [[id number]]   Medication Administration  Injection # 1:    Medication: Xolair (omalizumab) 150mg     Diagnosis: EXTRINSIC ASTHMA, UNSPECIFIED (ICD-493.00)    Route: SQ    Site: R deltoid    Exp Date: 05/2013    Lot #: [id number]    Mfr: [employer]    Comments: 1.2 ML IN RIGHT AND LEFT ARM 300MG  CHARGED [id number] AND [id number]    Given by: Dimas Millin IN [facility]  Orders Added: 1)  Xolair (omalizumab) 150mg  [J2357] 2)  Administration xolair injection [[id number]]

## 2010-07-09 ENCOUNTER — Ambulatory Visit (INDEPENDENT_AMBULATORY_CARE_PROVIDER_SITE_OTHER): Payer: Medicare Other

## 2010-07-09 ENCOUNTER — Encounter: Payer: Self-pay | Admitting: Critical Care Medicine

## 2010-07-09 DIAGNOSIS — J45909 Unspecified asthma, uncomplicated: Secondary | ICD-10-CM

## 2010-07-18 NOTE — Assessment & Plan Note (Signed)
Summary: xolair/mhh   Nurse Visit   Allergies: 1)  ! Levaquin 2)  ! Penicillin 3)  ! Codeine 4)  ! Cephalosporins 5)  ! * Chlorpheniramine 6)  ! * Eggs  Medication Administration  Injection # 1:    Medication: Xolair (omalizumab) 150mg     Diagnosis: EXTRINSIC ASTHMA, UNSPECIFIED (ICD-493.00)    Route: SQ    Site: R deltoid    Exp Date: 07/2013    Lot #: [id number]    Mfr: [employer]    Comments: 1.2 ML IN RIGHT AND LEFT ARM 300 MG CHARGED J2357 AND  91478     Given by: Drucie Opitz IN [facility]  Orders Added: 1)  Xolair (omalizumab) 150mg  [J2357] 2)  Administration xolair injection [[id number]]   Medication Administration  Injection # 1:    Medication: Xolair (omalizumab) 150mg     Diagnosis: EXTRINSIC ASTHMA, UNSPECIFIED (ICD-493.00)    Route: SQ    Site: R deltoid    Exp Date: 07/2013    Lot #: [id number]    Mfr: [employer]    Comments: 1.2 ML IN RIGHT AND LEFT ARM 300 MG CHARGED [id number] AND  [id number]     Given by: Drucie Opitz IN [facility]  Orders Added: 1)  Xolair (omalizumab) 150mg  [J2357] 2)  Administration xolair injection [[id number]]

## 2010-07-23 ENCOUNTER — Ambulatory Visit (INDEPENDENT_AMBULATORY_CARE_PROVIDER_SITE_OTHER): Payer: Medicare Other

## 2010-07-23 ENCOUNTER — Encounter: Payer: Self-pay | Admitting: Critical Care Medicine

## 2010-07-23 DIAGNOSIS — J45909 Unspecified asthma, uncomplicated: Secondary | ICD-10-CM

## 2010-07-30 ENCOUNTER — Encounter (INDEPENDENT_AMBULATORY_CARE_PROVIDER_SITE_OTHER): Payer: Medicare Other

## 2010-07-30 DIAGNOSIS — M79609 Pain in unspecified limb: Secondary | ICD-10-CM

## 2010-07-30 NOTE — Assessment & Plan Note (Signed)
Summary: xolair/mhh   Nurse Visit   Allergies: 1)  ! Levaquin 2)  ! Penicillin 3)  ! Codeine 4)  ! Cephalosporins 5)  ! * Chlorpheniramine 6)  ! * Eggs  Medication Administration  Injection # 1:    Medication: Xolair (omalizumab) 150mg     Diagnosis: EXTRINSIC ASTHMA, UNSPECIFIED (ICD-493.00)    Route: SQ    Site: L deltoid    Exp Date: 08/2013    Lot #: [id number]    Mfr: [employer]    Comments: 1.2 ML IN RIGHT AND LEFT ARM CHARGED 300MG  CHARGED [id number] AND  [id number]    Given by: Dimas Millin IN [facility]   Orders Added: 1)  Xolair (omalizumab) 150mg  [J2357] 2)  Administration xolair injection [[id number]]   Medication Administration  Injection # 1:    Medication: Xolair (omalizumab) 150mg     Diagnosis: EXTRINSIC ASTHMA, UNSPECIFIED (ICD-493.00)    Route: SQ    Site: L deltoid    Exp Date: 08/2013    Lot #: [id number]    Mfr: [employer]    Comments: 1.2 ML IN RIGHT AND LEFT ARM CHARGED 300MG  CHARGED [id number] AND  [id number]    Given by: Dimas Millin IN [facility]   Orders Added: 1)  Xolair (omalizumab) 150mg  [J2357] 2)  Administration xolair injection [[id number]]

## 2010-07-31 LAB — CBC
HCT: 30.2 % — ABNORMAL LOW (ref 36.0–46.0)
HCT: 31.4 % — ABNORMAL LOW (ref 36.0–46.0)
HCT: 35.4 % — ABNORMAL LOW (ref 36.0–46.0)
Hemoglobin: 10.5 g/dL — ABNORMAL LOW (ref 12.0–15.0)
Hemoglobin: 10.6 g/dL — ABNORMAL LOW (ref 12.0–15.0)
Hemoglobin: 12.3 g/dL (ref 12.0–15.0)
MCHC: 33.7 g/dL (ref 30.0–36.0)
MCHC: 34.6 g/dL (ref 30.0–36.0)
MCHC: 34.7 g/dL (ref 30.0–36.0)
MCHC: 34.9 g/dL (ref 30.0–36.0)
MCV: 89.6 fL (ref 78.0–100.0)
MCV: 90 fL (ref 78.0–100.0)
MCV: 90.2 fL (ref 78.0–100.0)
MCV: 90.4 fL (ref 78.0–100.0)
Platelets: 161 10*3/uL (ref 150–400)
Platelets: 165 10*3/uL (ref 150–400)
Platelets: 169 10*3/uL (ref 150–400)
Platelets: 180 10*3/uL (ref 150–400)
Platelets: 221 10*3/uL (ref 150–400)
Platelets: 240 10*3/uL (ref 150–400)
Platelets: 289 10*3/uL (ref 150–400)
RBC: 3.48 MIL/uL — ABNORMAL LOW (ref 3.87–5.11)
RBC: 3.76 MIL/uL — ABNORMAL LOW (ref 3.87–5.11)
RBC: 3.94 MIL/uL (ref 3.87–5.11)
RDW: 13.5 % (ref 11.5–15.5)
RDW: 13.6 % (ref 11.5–15.5)
RDW: 13.9 % (ref 11.5–15.5)
RDW: 13.9 % (ref 11.5–15.5)
RDW: 13.9 % (ref 11.5–15.5)
WBC: 3.8 10*3/uL — ABNORMAL LOW (ref 4.0–10.5)
WBC: 3.8 10*3/uL — ABNORMAL LOW (ref 4.0–10.5)
WBC: 4 10*3/uL (ref 4.0–10.5)

## 2010-07-31 LAB — DIFFERENTIAL
Basophils Absolute: 0 10*3/uL (ref 0.0–0.1)
Basophils Absolute: 0 10*3/uL (ref 0.0–0.1)
Basophils Absolute: 0 10*3/uL (ref 0.0–0.1)
Basophils Relative: 0 % (ref 0–1)
Basophils Relative: 1 % (ref 0–1)
Eosinophils Absolute: 0 10*3/uL (ref 0.0–0.7)
Eosinophils Absolute: 0.1 10*3/uL (ref 0.0–0.7)
Eosinophils Absolute: 0.1 10*3/uL (ref 0.0–0.7)
Eosinophils Relative: 0 % (ref 0–5)
Eosinophils Relative: 1 % (ref 0–5)
Eosinophils Relative: 1 % (ref 0–5)
Eosinophils Relative: 2 % (ref 0–5)
Eosinophils Relative: 4 % (ref 0–5)
Eosinophils Relative: 5 % (ref 0–5)
Lymphocytes Relative: 12 % (ref 12–46)
Lymphocytes Relative: 20 % (ref 12–46)
Lymphocytes Relative: 8 % — ABNORMAL LOW (ref 12–46)
Lymphs Abs: 0.7 10*3/uL (ref 0.7–4.0)
Lymphs Abs: 0.7 10*3/uL (ref 0.7–4.0)
Monocytes Absolute: 0.3 10*3/uL (ref 0.1–1.0)
Monocytes Absolute: 0.3 10*3/uL (ref 0.1–1.0)
Monocytes Absolute: 0.5 10*3/uL (ref 0.1–1.0)
Monocytes Absolute: 0.7 10*3/uL (ref 0.1–1.0)
Monocytes Relative: 8 % (ref 3–12)
Monocytes Relative: 9 % (ref 3–12)
Neutro Abs: 2.2 10*3/uL (ref 1.7–7.7)
Neutro Abs: 2.6 10*3/uL (ref 1.7–7.7)
Neutro Abs: 3.1 10*3/uL (ref 1.7–7.7)
Neutro Abs: 4.1 10*3/uL (ref 1.7–7.7)
Neutro Abs: 7.8 10*3/uL — ABNORMAL HIGH (ref 1.7–7.7)
Neutrophils Relative %: 69 % (ref 43–77)

## 2010-07-31 LAB — COMPREHENSIVE METABOLIC PANEL
ALT: 17 U/L (ref 0–35)
ALT: 17 U/L (ref 0–35)
ALT: 19 U/L (ref 0–35)
AST: 14 U/L (ref 0–37)
AST: 14 U/L (ref 0–37)
AST: 17 U/L (ref 0–37)
AST: 21 U/L (ref 0–37)
AST: 22 U/L (ref 0–37)
Albumin: 2.3 g/dL — ABNORMAL LOW (ref 3.5–5.2)
Albumin: 2.3 g/dL — ABNORMAL LOW (ref 3.5–5.2)
Albumin: 2.3 g/dL — ABNORMAL LOW (ref 3.5–5.2)
Albumin: 2.5 g/dL — ABNORMAL LOW (ref 3.5–5.2)
Alkaline Phosphatase: 65 U/L (ref 39–117)
Alkaline Phosphatase: 70 U/L (ref 39–117)
Alkaline Phosphatase: 73 U/L (ref 39–117)
BUN: 13 mg/dL (ref 6–23)
BUN: 14 mg/dL (ref 6–23)
BUN: 18 mg/dL (ref 6–23)
BUN: 18 mg/dL (ref 6–23)
CO2: 28 mEq/L (ref 19–32)
CO2: 28 mEq/L (ref 19–32)
CO2: 28 mEq/L (ref 19–32)
CO2: 30 mEq/L (ref 19–32)
Calcium: 7.8 mg/dL — ABNORMAL LOW (ref 8.4–10.5)
Calcium: 8.1 mg/dL — ABNORMAL LOW (ref 8.4–10.5)
Chloride: 93 mEq/L — ABNORMAL LOW (ref 96–112)
Chloride: 93 mEq/L — ABNORMAL LOW (ref 96–112)
Chloride: 94 mEq/L — ABNORMAL LOW (ref 96–112)
Creatinine, Ser: 1.17 mg/dL (ref 0.4–1.2)
Creatinine, Ser: 1.72 mg/dL — ABNORMAL HIGH (ref 0.4–1.2)
GFR calc Af Amer: 45 mL/min — ABNORMAL LOW (ref >60)
GFR calc Af Amer: 55 mL/min — ABNORMAL LOW (ref >60)
GFR calc Af Amer: >60 mL/min (ref >60)
GFR calc non Af Amer: 29 mL/min — ABNORMAL LOW (ref >60)
GFR calc non Af Amer: 37 mL/min — ABNORMAL LOW (ref >60)
GFR calc non Af Amer: 45 mL/min — ABNORMAL LOW (ref >60)
GFR calc non Af Amer: 47 mL/min — ABNORMAL LOW (ref >60)
Glucose, Bld: 117 mg/dL — ABNORMAL HIGH (ref 70–99)
Glucose, Bld: 147 mg/dL — ABNORMAL HIGH (ref 70–99)
Potassium: 3.6 mEq/L (ref 3.5–5.1)
Potassium: 3.7 mEq/L (ref 3.5–5.1)
Potassium: 3.9 mEq/L (ref 3.5–5.1)
Potassium: 4.2 mEq/L (ref 3.5–5.1)
Sodium: 129 mEq/L — ABNORMAL LOW (ref 135–145)
Total Bilirubin: 0.4 mg/dL (ref 0.3–1.2)
Total Bilirubin: 0.5 mg/dL (ref 0.3–1.2)
Total Bilirubin: 0.5 mg/dL (ref 0.3–1.2)
Total Protein: 6.2 g/dL (ref 6.0–8.3)

## 2010-07-31 LAB — BASIC METABOLIC PANEL
BUN: 14 mg/dL (ref 6–23)
CO2: 31 mEq/L (ref 19–32)
Creatinine, Ser: 1.04 mg/dL (ref 0.4–1.2)
GFR calc Af Amer: >60 mL/min (ref >60)
Glucose, Bld: 127 mg/dL — ABNORMAL HIGH (ref 70–99)
Potassium: 3.5 mEq/L (ref 3.5–5.1)

## 2010-07-31 LAB — URINE MICROSCOPIC-ADD ON

## 2010-07-31 LAB — URINALYSIS, ROUTINE W REFLEX MICROSCOPIC
Bilirubin Urine: NEGATIVE
Glucose, UA: NEGATIVE mg/dL
Hgb urine dipstick: NEGATIVE
Protein, ur: NEGATIVE mg/dL
Urobilinogen, UA: 1 mg/dL (ref 0.0–1.0)

## 2010-07-31 LAB — URINE CULTURE
Colony Count: NO GROWTH
Culture: NO GROWTH

## 2010-08-06 ENCOUNTER — Ambulatory Visit (INDEPENDENT_AMBULATORY_CARE_PROVIDER_SITE_OTHER): Payer: Medicare Other

## 2010-08-06 DIAGNOSIS — J45909 Unspecified asthma, uncomplicated: Secondary | ICD-10-CM

## 2010-08-06 NOTE — Procedures (Unsigned)
DUPLEX DEEP VENOUS EXAM - LOWER EXTREMITY  INDICATION:  Right lower extremity pain and edema.  HISTORY:  Edema:  Yes Trauma/Surgery:  No Pain:  Yes, x2 days PE:  No Previous DVT:  Right lower extremity, approximately in 2006 Anticoagulants:  No  DUPLEX EXAM:               CFV   SFV   PopV  PTV    GSV               R  L  R  L  R  L  R   L  R  L Thrombosis    o  o  o     o     o      o Spontaneous   +  +  +     +     +      + Phasic        +  +  +     +     +      + Augmentation  +  +  +     +     +      + Compressible  +  +  +     +     +      + Competent     +  +  +     +     +      +  Legend:  + - yes  o - no  p - partial  D - decreased  IMPRESSION:  No evidence of deep vein thrombosis is noted.  Preliminary report was called to Dr. Laurey Morale office.   _____________________________ Quita Skye. Hart Rochester, M.D.  EM/MEDQ  D:  07/30/2010  T:  07/30/2010  Job:  [id number]

## 2010-08-07 MED ORDER — OMALIZUMAB 150 MG ~~LOC~~ SOLR
300.0000 mg | Freq: Once | SUBCUTANEOUS | Status: AC
  Administered 2010-08-06: 300 mg via SUBCUTANEOUS

## 2010-08-09 ENCOUNTER — Other Ambulatory Visit: Payer: Self-pay | Admitting: Cardiovascular Disease

## 2010-08-19 ENCOUNTER — Ambulatory Visit (INDEPENDENT_AMBULATORY_CARE_PROVIDER_SITE_OTHER): Payer: Medicare Other

## 2010-08-19 ENCOUNTER — Ambulatory Visit: Payer: Medicare Other | Admitting: Critical Care Medicine

## 2010-08-19 ENCOUNTER — Ambulatory Visit: Payer: Medicare Other

## 2010-08-19 DIAGNOSIS — J45909 Unspecified asthma, uncomplicated: Secondary | ICD-10-CM

## 2010-08-19 MED ORDER — OMALIZUMAB 150 MG ~~LOC~~ SOLR
300.0000 mg | Freq: Once | SUBCUTANEOUS | Status: AC
  Administered 2010-08-19: 300 mg via SUBCUTANEOUS

## 2010-08-28 ENCOUNTER — Encounter: Payer: Self-pay | Admitting: Critical Care Medicine

## 2010-08-30 ENCOUNTER — Ambulatory Visit (INDEPENDENT_AMBULATORY_CARE_PROVIDER_SITE_OTHER): Payer: Medicare Other | Admitting: Critical Care Medicine

## 2010-08-30 ENCOUNTER — Encounter: Payer: Self-pay | Admitting: Critical Care Medicine

## 2010-08-30 ENCOUNTER — Ambulatory Visit (INDEPENDENT_AMBULATORY_CARE_PROVIDER_SITE_OTHER): Payer: Medicare Other

## 2010-08-30 ENCOUNTER — Ambulatory Visit: Payer: Medicare Other

## 2010-08-30 VITALS — BP 120/60 | HR 81 | Temp 98.0°F | Ht 65.0 in | Wt 179.0 lb

## 2010-08-30 DIAGNOSIS — J449 Chronic obstructive pulmonary disease, unspecified: Secondary | ICD-10-CM

## 2010-08-30 DIAGNOSIS — J45909 Unspecified asthma, uncomplicated: Secondary | ICD-10-CM

## 2010-08-30 MED ORDER — PREDNISONE 10 MG PO TABS: ORAL_TABLET | ORAL | Status: DC

## 2010-08-30 MED ORDER — METHYLPREDNISOLONE ACETATE 80 MG/ML IJ SUSP
120.0000 mg | Freq: Once | INTRAMUSCULAR | Status: AC
  Administered 2010-08-30: 120 mg via INTRAMUSCULAR

## 2010-08-30 NOTE — Progress Notes (Signed)
Subjective:    Patient ID: [name], female    DOB: [birth year], 77 y.o.   MRN: [id number]  Cough This is a new problem. The current episode started in the past 7 days. The problem has been rapidly worsening. The problem occurs constantly. The cough is productive of sputum. Associated symptoms include chest pain, ear congestion, heartburn, nasal congestion, postnasal drip, rhinorrhea, a sore throat, shortness of breath and wheezing. Pertinent negatives include no fever, headaches, hemoptysis or rash. The symptoms are aggravated by cold air. Her past medical history is significant for asthma and pneumonia.   08/30/2010 More difficulty with cough.  More wheeze.  Pollen an issue.  Nose will drip, Cough is dry. See symptoms above. Pt denies any significant sore throat, notes nasal congestion no  excess secretions, fever, chills, sweats, unintended weight loss, pleurtic or exertional chest pain, orthopnea PND, or leg swelling Pt denies any increase in rescue therapy over baseline, denies waking up needing it or having any early am or nocturnal exacerbations of coughing/wheezing/or dyspnea. Pt also denies any obvious fluctuation in symptoms with  weather or environmental change or other alleviating or aggravating factors   Past Medical History  Diagnosis Date  . Restless legs syndrome (RLS)   . Osteoporosis, unspecified   . Unspecified essential hypertension   . Other diseases of vocal cords   . Unspecified asthma   . Esophageal reflux   . Allergic rhinitis, cause unspecified   . Obstructive chronic bronchitis without exacerbation      Family History  Problem Relation Age of Onset  . Breast cancer    . Heart disease       History   Social History  . Marital Status: Married    Spouse Name: N/A    Number of Children: N/A  . Years of Education: N/A   Occupational History  . Not on file.   Social History Main Topics  . Smoking status: Never Smoker   . Smokeless tobacco: Never  Used  . Alcohol Use: No  . Drug Use: No  . Sexually Active: Not on file   Other Topics Concern  . Not on file   Social History Narrative  . No narrative on file     Allergies  Allergen Reactions  . Cephalosporins     REACTION: rash  . Codeine     REACTION: rash  . Eggs Or Egg-Derived Products   . Levofloxacin     REACTION: rash  . Penicillins     REACTION: rash     Outpatient Prescriptions Prior to Visit  Medication Sig Dispense Refill  . albuterol (PROAIR HFA) 108 (90 BASE) MCG/ACT inhaler Inhale 2 puffs into the lungs every 6 (six) hours as needed.        . ALPRAZolam (XANAX) 0.5 MG tablet Take 1/2 two times a day       . aspirin 81 MG tablet Take 81 mg by mouth daily.        [name] azelastine (ASTELIN) 137 MCG/SPRAY nasal spray 2 sprays by Nasal route 2 (two) times daily. Use in each nostril as directed      . benzonatate (TESSALON) 100 MG capsule Take 100 mg by mouth 3 (three) times daily as needed.        . budesonide-formoterol (SYMBICORT) 160-4.5 MCG/ACT inhaler Inhale 2 puffs into the lungs 2 (two) times daily.        . Calcium Carbonate-Vitamin D (CALTRATE 600+D) 600-400 MG-UNIT per tablet Take 1 tablet by mouth 2 (  two) times daily.        . Cholecalciferol (VITAMIN D3) 1000 UNITS tablet Take 1,000 Units by mouth daily.        . CRESTOR 5 MG tablet TAKE 1 TABLET DAILY  90 tablet  3  . esomeprazole (NEXIUM) 40 MG capsule Take 40 mg by mouth 2 (two) times daily.        . fexofenadine (ALLEGRA) 180 MG tablet Take 180 mg by mouth daily.        [name] guar gum packet Take by mouth as needed.        . hydrochlorothiazide 25 MG tablet Take 25 mg by mouth daily.        [name] ibandronate (BONIVA) 150 MG tablet Take 150 mg by mouth every 30 (thirty) days. Take in the morning with a full glass of water, on an empty stomach, and do not take anything else by mouth or lie down for the next 30 min.       . mometasone (NASONEX) 50 MCG/ACT nasal spray 2 sprays by Nasal route 2 (two) times daily.         . montelukast (SINGULAIR) 10 MG tablet Take 10 mg by mouth at bedtime.        [name] omalizumab (XOLAIR) 150 MG injection Inject 150 mg into the skin every 14 (fourteen) days.        . potassium chloride (K-DUR,KLOR-CON) 10 MEQ tablet Two tablets once daily       . pramipexole (MIRAPEX) 0.125 MG tablet 3 tablets at bedtime       . Spacer/Aero-Holding Chambers (AEROCHAMBER MV) inhaler by Other route. Use as instructed       . telmisartan (MICARDIS) 40 MG tablet Take 40 mg by mouth daily.        . vitamin B-12 (CYANOCOBALAMIN) 500 MCG tablet Take 500 mcg by mouth daily.        . Pseudoephedrine-DM-GG (TUSSIN COLD/CONGESTION) 30-10-200 MG CAPS Take 1 capsule by mouth 2 (two) times daily as needed.            Review of Systems  Constitutional: Negative for fever.  HENT: Positive for sore throat, rhinorrhea and postnasal drip.   Respiratory: Positive for cough, shortness of breath and wheezing. Negative for hemoptysis.   Cardiovascular: Positive for chest pain.  Gastrointestinal: Positive for heartburn.  Skin: Negative for rash.  Neurological: Negative for headaches.       Objective:   Physical Exam Gen: Pleasant, well-nourished, in no distress,  normal affect  ENT: No lesions,  mouth clear,  oropharynx clear, mod postnasal drip  Neck: No JVD, no TMG, no carotid bruits  Lungs: No use of accessory muscles, no dullness to percussion, exp wheeze  Cardiovascular: RRR, heart sounds normal, no murmur or gallops, no peripheral edema  Abdomen: soft and NT, no HSM,  BS normal  Musculoskeletal: No deformities, no cyanosis or clubbing  Neuro: alert, non focal  Skin: Warm, no lesions or rashes        Assessment & Plan:   BRONCHITIS, OBSTRUCTIVE CHRONIC W/O EXACRB Moderate persistent asthma flare  Plan Depomedrol injection 120mg  IM Pulse prednisone    Updated Medication List Outpatient Encounter Prescriptions as of 08/30/2010  Medication Sig Dispense Refill  . albuterol  (PROAIR HFA) 108 (90 BASE) MCG/ACT inhaler Inhale 2 puffs into the lungs every 6 (six) hours as needed.        . ALPRAZolam (XANAX) 0.5 MG tablet Take 1/2 two times a day       .  aspirin 81 MG tablet Take 81 mg by mouth daily.        [name] azelastine (ASTELIN) 137 MCG/SPRAY nasal spray 2 sprays by Nasal route 2 (two) times daily. Use in each nostril as directed      . benzonatate (TESSALON) 100 MG capsule Take 100 mg by mouth 3 (three) times daily as needed.        . budesonide-formoterol (SYMBICORT) 160-4.5 MCG/ACT inhaler Inhale 2 puffs into the lungs 2 (two) times daily.        . Calcium Carbonate-Vitamin D (CALTRATE 600+D) 600-400 MG-UNIT per tablet Take 1 tablet by mouth 2 (two) times daily.        . Cholecalciferol (VITAMIN D3) 1000 UNITS tablet Take 1,000 Units by mouth daily.        . CRESTOR 5 MG tablet TAKE 1 TABLET DAILY  90 tablet  3  . esomeprazole (NEXIUM) 40 MG capsule Take 40 mg by mouth 2 (two) times daily.        . fexofenadine (ALLEGRA) 180 MG tablet Take 180 mg by mouth daily.        [name] guar gum packet Take by mouth as needed.        . hydrochlorothiazide 25 MG tablet Take 25 mg by mouth daily.        [name] ibandronate (BONIVA) 150 MG tablet Take 150 mg by mouth every 30 (thirty) days. Take in the morning with a full glass of water, on an empty stomach, and do not take anything else by mouth or lie down for the next 30 min.       . mometasone (NASONEX) 50 MCG/ACT nasal spray 2 sprays by Nasal route 2 (two) times daily.        . montelukast (SINGULAIR) 10 MG tablet Take 10 mg by mouth at bedtime.        [name] omalizumab (XOLAIR) 150 MG injection Inject 150 mg into the skin every 14 (fourteen) days.        . potassium chloride (K-DUR,KLOR-CON) 10 MEQ tablet Two tablets once daily       . pramipexole (MIRAPEX) 0.125 MG tablet 3 tablets at bedtime       . Spacer/Aero-Holding Chambers (AEROCHAMBER MV) inhaler by Other route. Use as instructed       . telmisartan (MICARDIS) 40 MG tablet Take 40  mg by mouth daily.        . TUSSICAPS 10-8 MG CP12 Take 1 capsule by mouth At bedtime.      . vitamin B-12 (CYANOCOBALAMIN) 500 MCG tablet Take 500 mcg by mouth daily.        . predniSONE (DELTASONE) 10 MG tablet Take 4 for three days, 3 for three days, 2 for three days, 1 for three days and stop   30 tablet  0  . DISCONTD: Pseudoephedrine-DM-GG (TUSSIN COLD/CONGESTION) 30-10-200 MG CAPS Take 1 capsule by mouth 2 (two) times daily as needed.        . methylPREDNISolone acetate (DEPO-MEDROL) injection 120 mg

## 2010-08-30 NOTE — Patient Instructions (Signed)
Start prednisone 10mg  Take 4 for three days, 3 for three days, 2 for three days, 1 for three days and stop A depomedrol injection 120mg  will be given  No other med changes  Return 6 weeks

## 2010-08-31 NOTE — Assessment & Plan Note (Signed)
Moderate persistent asthma flare  Plan Depomedrol injection 120mg  IM Pulse prednisone

## 2010-09-02 MED ORDER — OMALIZUMAB 150 MG ~~LOC~~ SOLR
300.0000 mg | Freq: Once | SUBCUTANEOUS | Status: AC
  Administered 2010-09-02: 300 mg via SUBCUTANEOUS

## 2010-09-13 ENCOUNTER — Ambulatory Visit (INDEPENDENT_AMBULATORY_CARE_PROVIDER_SITE_OTHER): Payer: Medicare Other

## 2010-09-13 ENCOUNTER — Ambulatory Visit: Payer: Medicare Other

## 2010-09-13 DIAGNOSIS — J45909 Unspecified asthma, uncomplicated: Secondary | ICD-10-CM

## 2010-09-13 MED ORDER — OMALIZUMAB 150 MG ~~LOC~~ SOLR
300.0000 mg | Freq: Once | SUBCUTANEOUS | Status: AC
  Administered 2010-09-13: 300 mg via SUBCUTANEOUS

## 2010-09-24 NOTE — H&P (Signed)
[name], [name]             ACCOUNT NO.:  [id number]   MEDICAL RECORD NO.:  [id number]          PATIENT TYPE:  OUT   LOCATION:  XRAY                         FACILITY:  [facility]   PHYSICIAN:  Vesta Mixer, M.D. DATE OF BIRTH:  [birth year]   DATE OF ADMISSION:  12/09/2006  DATE OF DISCHARGE:                              HISTORY & PHYSICAL   DATE OF OFFICE VISIT:  December 09, 2006.   [name] is an elderly female with a history of asthma,  hypercholesterolemia and hypertension.  She is admitted for heart  catheterization after having an abnormal stress Cardiolite study.   [name] has had a long history of shortness of breath.  She also has  a history of breast cancer.  She recently had an episode of angina-like  symptoms.  We performed as an adenosine Cardiolite study and she was  found to have anteroapical reversible ischemia.   She has been having some increasing shortness of breath but thinks that  this might be just due to the hot weather and the ozone in the air.  She  has not had any severe episodes of chest pain.  She did have an unusual  discomfort today.  She took a nitroglycerin but it did not really seem  to change the pain.  The pain later went away and really did not bother  her that much.   CURRENT MEDICATIONS:  1. Micardis 40 mg p.o. b.i.d.  2. Alprazolam 0.5 mg tablets one-half tablet twice a day.  3. Nexium 40 mg twice a day.  4. Allegra 180 mg a day.  5. Accolate 20 mg twice a day.  6. Caltrate twice a day.  7. Nasonex 2 sprays a day.  8. Hydrochlorothiazide 12.5 mg p.o. b.i.d.  9. Potassium chloride once a day.  10.Aspirin 81 mg a day.  11.Requip 1 mg a day.  12.Crestor 5 mg.  13.Boniva 150 mg once a month.  14.Symbicort twice a day.   ALLERGIES:  None.   PAST MEDICAL HISTORY:  1. Hypertension.  2. Asthma.  3. History of breast cancer, status post bilateral breast      reconstruction.   SOCIAL HISTORY:  The patient is a nonsmoker.   FAMILY HISTORY:  Her father died at age 34 due to old age and congestive  heart failure.  Her mother died at age 59 due to asthma and myocardial  infarction.   REVIEW OF SYSTEMS:  Reviewed and is essentially negative.  She denies  problems with heat or cold intolerance.  She denies any fevers, chills.  She denies any problems with her eyes, ears, nose and throat.  She has  had some chest pain as noted above.  She denies any hematochezia or  hemoptysis.  She denies any skin changes.  Her review of systems is  reviewed and is essentially negative except for as noted in the HPI.   EXAM:  She is an elderly female in no acute distress.  She is alert and  oriented x3, and her mood and affect are normal.  Her weight is 178, blood pressure is 134/80 with  a heart rate of 68.  HEENT:  2+ carotids.  She has no bruits, no JVD, no thyromegaly.  LUNGS:  Clear to auscultation.  HEART:  Regular rate, S1-S2.  ABDOMEN:  Good bowel sounds and nontender.  EXTREMITIES:  She has no clubbing, cyanosis or edema.  NEUROLOGIC:  Nonfocal.   Her EKG reveals normal sinus rhythm.  She has poor R-wave progression,  but this is unchanged from previous tracings.  It may be due to lead  placement.   Her stress Cardiolite study reveals anterior apical reversible ischemia.   [name] presents with some episodes of chest discomfort and now has  an abnormal stress Cardiolite study.  We will refer her for heart  catheterization.  We have discussed the risks, benefits and options  concerning heart catheterization.  She understands and agrees to  proceed.           ______________________________  Vesta Mixer, M.D.     PJN/MEDQ  D:  12/09/2006  T:  12/10/2006  Job:  [id number]   cc:   Loraine Leriche A. Perini, M.D.

## 2010-09-24 NOTE — Cardiovascular Report (Signed)
[name], [name]             ACCOUNT NO.:  [id number]   MEDICAL RECORD NO.:  [id number]          PATIENT TYPE:  INP   LOCATION:  3704                         FACILITY:  [facility]   PHYSICIAN:  Vesta Mixer, M.D. DATE OF BIRTH:  June 11, 1932   DATE OF PROCEDURE:  12/11/2006  DATE OF DISCHARGE:  12/12/2006                            CARDIAC CATHETERIZATION   Ms. Paulding is an elderly female with a history of chest pains in the  past.  She has had an anteroapical wall motion defect on Cardiolite  scanning.  She is now admitted to the hospital with chest pains and is  referred for heart catheterization.   The right femoral artery was easily cannulated using a modified  Seldinger technique.   HEMODYNAMICS:  The LV pressure is 106/7 with an aortic pressure of  100/48.   ANGIOGRAPHY:  Left main:  The left main is fairly short but is smooth  and normal.   The left anterior descending artery is fairly smooth and normal  throughout its source.  It is moderate-sized vessel.  It gives off a  high diagonal vessel which is also normal.  The LAD is somewhat  tortuous.  There are some minor luminal irregularities in the distal  aspect of the LAD.   Left circumflex artery is an extremely large branch.  It essentially  gives off a first obtuse marginal and then terminates as a second obtuse  marginal artery.  Both these branches are very tortuous.  There are no  irregularities.   The right coronary artery is very large and is dominant.  It is smooth  and normal throughout its course.  The posterior descending artery and  the posterolateral segment artery are normal.   Left ventriculogram was performed in a 30 RAO position.  It revealed  normal left ventricular systolic function.  The ejection fraction is  around 65%.  There is no mitral regurgitation.   COMPLICATIONS:  None.   CONCLUSIONS:  1. Fairly smooth and normal coronary arteries.  2. Normal left ventricular systolic  function.           ______________________________  Vesta Mixer, M.D.     PJN/MEDQ  D:  12/22/2006  T:  12/23/2006  Job:  [id number]

## 2010-09-24 NOTE — Procedures (Signed)
NAME:  [name]               ACCOUNT NO.:  [id number]   MEDICAL RECORD NO.:  [id number]         PATIENT TYPE:  OUT   LOCATION:  SLEEP CENTER                 FACILITY:  [facility]   PHYSICIAN:  Oretha Milch, MD      DATE OF BIRTH:  1932-10-29   DATE OF STUDY:  10/06/2008                            NOCTURNAL POLYSOMNOGRAM   REFERRING PHYSICIAN:  Charlcie Cradle. Delford Field, MD, FCCP   INDICATION FOR THE STUDY:  Excessive daytime fatigue, snoring, and  nonrefreshing sleep in this 77 year old woman with a height of 65  inches, weight of 174 pounds, BMI of 29, neck size 14 inches.   EPWORTH SLEEPINESS SCORE:  6.   BEDTIME MEDICATIONS:  Include alprazolam, cough syrup with codeine,  calcium, Crestor, iron, Nasonex, Nexium Singulair, Symbicort, Mirapex,  potassium.   This nocturnal polysomnogram was performed with a sleep technologist in  attendance.  EEG, EOG, EMG, EKG, respiratory parameters were recorded.  Sleep stages were scored.  Sleep stages arousals, limb movements, and  respiratory data were scored according to criteria laid out by the  American Academy of Sleep Medicine.   SLEEP ARCHITECTURE:  Lights out was at 10:39 p.m.  Lights on was at 4:52  a.m.  Total sleep time was 288 minutes with a sleep period time of 322  minutes and a sleep efficiency of 77%.  Sleep latency was 42 minutes and  latency to REM sleep was 170 minutes.  Sleep stages of the percentage of  total sleep time was N1 6%, N2 81%, N3 0%, and REM sleep 13% (38 minutes  REM sleep was noted in 2 periods at 2:00 a.m. and 4:30 a.m.).  She spent  101 minutes in the supine position and the latter half of the study.   AROUSAL DATA:  There were a total of 74 arousals with an arousal index  of 15 events per hour of these 56 were spontaneous, 13 were associated  with hypopneas, and 5 with limb movement.   RESPIRATORY DATA:  There were 0 obstructive apnea, 0 central apnea, 0  mixed apneas, and 33 hypopneas with an  apnea-hypopnea index of 6.9  events per hour.  A 33 RERAs were noted with an RDI of 13.8 events per  hour of the REM related.  AHI was 30 events per hour and the supine AHI  was 18 events per hour.  All the events were noted in the supine  position.   OXYGEN SATURATION DATA:  The lowest desaturation was 87%.  She spent 2  minutes with a saturation less than 88%.  Desaturation index was 0.   LIMB MOVEMENT DATA:  The limb movement index was 1.3 events per hour.  Limb movement arousal index was 1 event per hour.   CARDIAC DATA:  Lowest heart rate was 32 beats per minute.  The high  heart rate recorded was an artifact.  No arrhythmias were noted.   DISCUSSION:  No slow wave sleep was noted.  Events were noted in supine  position only both during REM and non-REM sleep in the latter half of  the night.  She was desensitized with a medium full-face mask, but  did  not meet split protocol.   IMPRESSION:  1. Mild sleep disordered breathing with predominant hypopneas noted      only in the supine position causing some sleep fragmentation and      oxygen desaturation.  2. No evidence of cardiac arrhythmias or behavioral disturbance during      sleep.  3. Few limb movements likely not significant.   RECOMMENDATIONS:  1. She does not seem to be excessively sleepy based on the Epworth      sleepiness score.  If excessive fatigue and nonrefreshing sleep or      issues, I would recommend positional therapy.  Various measures can      be used to avoid sleeping in the supine position.  2. I wonder if her bedtime sedating medication such as alprazolam,      codeine syrup are contributing to supine sleep disordered      breathing.  3. The limb movements did not appear to be significant, note that the      study was performed on Mirapex.      Oretha Milch, MD  Electronically Signed     RVA/MEDQ  D:  10/12/2008 11:01:16  T:  10/13/2008 45:40:98  Job:  [id number]   cc:   Charlcie Cradle. Delford Field, MD,  FCCP  [address]  [place]  [place] [address]

## 2010-09-24 NOTE — Discharge Summary (Signed)
[name], [name]             ACCOUNT NO.:  [id number]   MEDICAL RECORD NO.:  [id number]          PATIENT TYPE:  INP   LOCATION:  3704                         FACILITY:  [facility]   PHYSICIAN:  ____                   DATE OF BIRTH:  1933-04-05   DATE OF ADMISSION:  12/10/2006  DATE OF DISCHARGE:  12/12/2006                               DISCHARGE SUMMARY   DISCHARGE DIAGNOSIS:  1. Chest pain, resolved.  2. Nonobstructive coronary artery disease.  3. Hypertension.   ALLERGIES:  1. Sulfa.  2. Penicillin.   HOSPITAL COURSE:  [name] is a 77 year old female who was admitted  to [facility] with chest pain.  Cardiovascular enzymes were obtained, and  maximized __________  CKs with MB fraction 16.2, all troponins were  negative. The patient underwent interval cardiac catheterization that  showed left anterior descending with first diagonal 15% proximal  stenosis, circumflex normal, right coronary artery large dominant  vessel, no occlusive disease.  Dr. Elease Hashimoto felt that this first diagonal  lesion was not the cause of her elevated cardiovascular enzymes due to  the mild degree of coronary artery disease.   For this reason, he did have a gastroenterology consultation performed.  Essentially no gastrointestinal workup was felt to be necessary as she  had had panendoscopy in 2005.  At that time, she was found to have an  esophageal stricture, gastroesophageal reflux disease, reflux  esophagitis.   The patient was discharged to home on December 12, 2006 in stable  condition.  Laboratory work included hemoglobin 10.9, hematocrit 31.7,  BUN 11, creatinine 1.3.   The patient will need CBC and BMET checked on December 14, 2006 with Dr.  Elease Hashimoto.  Medications are otherwise as follows:  1. Micardis 40 mg twice a day.  2. Nexium 40 mg twice a day.  3. Xanax 0.25 mg twice a day.  4. Allegra daily as needed.  5. Ocuvite as prior to admission.  6. Calcitrate as prior to admission.  7. Nasonex  p.r.n.  8. Baby aspirin daily.  9. Potassium once daily.  10.Requip as prior to admission.  11.Restoril as prior to admission.  12.Boniva as prior to admission.  13.Symbicort as prior to admission.  14.Zoloft as prior to admission.  15.Vitamin B12 as prior to admission.   The patient is to stop taking hydrochlorothiazide.  Increase activity  slowly.  Follow up with Dr. Elease Hashimoto next week with labs as instructed.      Guy Franco, P.A.    ______________________________  ____    [name]  D:  01/08/2007  T:  01/09/2007  Job:  [id number]   cc:   Vesta Mixer, M.D.  Corky Crafts, MD

## 2010-09-24 NOTE — Assessment & Plan Note (Signed)
[facility]                             PULMONARY OFFICE NOTE   SHAWNI, VOLKOV                      MRN:          [id number]  DATE:12/31/2006                            DOB:          08/23/1932    Ms. Authier is a 77 year old white female with a history of asthmatic  bronchitis, reflux disease, allergic rhinitis.  The patient has had dry  cough, increased postnasal drainage, but otherwise level of dyspnea has  been satisfactory.  She did undergo, recently, a heart catheterization,  and developed a hematoma in the right groin with this, but  catheterization did not reveal coronary disease.  She has been  maintained on Astelin 2 sprays each nostril twice daily, Accolate 20 mg  b.i.d., Allegra 180 mg daily, nasal wash b.i.d., Nasonex 2 sprays b.i.d.  each nostril, Nexium 40 mg b.i.d., Symbicort 160/4.5 two sprays b.i.d.  Other maintenance medicines listed on the chart are correct as reviewed.   PHYSICAL EXAMINATION:  This is a middle-aged female, no distress.  Temp 97, blood pressure 124/72, pulse 52, saturation 99% on room air.  CHEST:  Showed distant breath sounds with prolonged expiratory phase, no  wheeze or rhonchi noted.  CARDIAC EXAM:  Showed a regular rate and rhythm without S3, normal S1,  S2.  ABDOMEN:  Soft, nontender.  EXTREMITIES:  Showed no edema or clubbing, skin was clear.   IMPRESSION:  Moderate persistent asthma, significant atopic features,  stable at this time.   PLAN:  Maintain inhaled medicines as currently dosed.  No change in plan  of care.  We will see the patient back in followup in 2 months.     Charlcie Cradle Delford Field, MD, [facility]  Electronically Signed    PEW/MedQ  DD: 12/31/2006  DT: 01/01/2007  Job #: [id number]   cc:   Loraine Leriche A. Perini, M.D.

## 2010-09-24 NOTE — Assessment & Plan Note (Signed)
[facility]                             PULMONARY OFFICE NOTE   CALIA, NAPP                      MRN:          [id number]  DATE:03/02/2007                            DOB:          04/24/1933    HISTORY:  Ms. Vazquez is a 77 year old white female, with a history of  severe atopic asthma, reflux disease and  allergic rhinitis.  She  returns today with her cyclic cough as usual but no new complaints.   CURRENT MEDICATIONS:  1. She maintains Astelin two sprays twice daily in each nostril.  2. Accolate 20 mg twice daily.  3. Allegra 180 mg daily.  4. Nasonex two sprays in each nostril twice daily.  5. Nexium 40 mg twice daily.  6. Symbicort 160/4.5 mg two sprays twice daily.  7. Xolair q.2 weeks.   PHYSICAL EXAMINATION:  VITAL SIGNS:  Temperature 98 degrees, blood  pressure 130/pulse 80, saturation 97% on room air.  CHEST:  Diminished breath sounds.  No evidence of wheeze, rale or  rhonchi.  HEART:  A regular rate and rhythm without S3.  Normal S1 and S2.  ABDOMEN:  Soft, nontender.  EXTREMITIES:  Showed no edema or clubbing.  SKIN:  Clear.  NEUROLOGIC:  Intact.   IMPRESSION:  1. Moderate persistent asthma.  2. Allergic rhinitis, stable at this time.   PLAN:  To maintain medication profile as is.  Will see the patient back  in followup in two months.     Charlcie Cradle Delford Field, MD, [facility]  Electronically Signed    PEW/MedQ  DD: 03/02/2007  DT: 03/03/2007  Job #: [id number]

## 2010-09-24 NOTE — Assessment & Plan Note (Signed)
[name]                             PULMONARY OFFICE NOTE   [name], [name]                      MRN:          [id number]  DATE:11/17/2006                            DOB:          1932-08-07    [name] is a 78 year old white female, history of severe, persistent  asthma and significant atopy.  The patient maintains Accolate 20 mg  b.i.d., Astelin two sprays b.i.d., Nasonex two sprays b.i.d., nasal wash  twice daily, Symbicort 160/4.5 mcg two sprays b.i.d., Xolair every 2  weeks at maximum dosage of 300 mg.  She continues to have cough,  occasional dyspnea, but overall is managing fairly well.   On exam, temperature 97.8, blood pressure 106/68, pulse 82, saturation  of 90% on room air.  The chest actually showed to be clear today without evidence of  adventitious breath sounds, no wheeze or rhonchi were noted.  Cardiac exam showed a regular rate and rhythm without S3, normal S1, S2.  Abdomen was soft, nontender.  Extremities showed no edema or clubbing.  Skin was clear.  Neurologic exam was intact.  HEENT exam showed no jugular venous distention, no lymphadenopathy.  Oropharynx was clear.  Neck was supple.   IMPRESSION:  Severe persistent asthma with significant atopic features.   Plan is to maintain inhaled medicines as currently dosed and we will see  the patient back in return follow-up in 2 months.     Charlcie Cradle Delford Field, MD, [facility]  Electronically Signed    PEW/MedQ  DD: 11/17/2006  DT: 11/18/2006  Job #: [id number]

## 2010-09-24 NOTE — H&P (Signed)
NAMEKARSTYN, BIRKEY             ACCOUNT NO.:  [id number]   MEDICAL RECORD NO.:  [id number]          PATIENT TYPE:  INP   LOCATION:  3704                         FACILITY:  [facility]   PHYSICIAN:  Francisca December, M.D.  DATE OF BIRTH:  20-Jun-1932   DATE OF ADMISSION:  12/10/2006  DATE OF DISCHARGE:                              HISTORY & PHYSICAL   REASON FOR ADMISSION:  Chest discomfort and nausea.   HISTORY OF PRESENT ILLNESS:  [name] is a pleasant 77 year old  woman who earlier this week underwent a stress Cardiolite at Dr.  Harvie Bridge office because of symptoms of sweating and chest discomfort  with activity, especially upper arm movement.  She previously had a 2-D  echocardiogram, the results of which sound quite benign.  The  Cardiolite, apparently, showed an abnormality but she is not able to  describe exactly what was.  She was scheduled for a cardiac  catheterization on August 4.   Yesterday, she underwent a CT scan of chest and abdomen because of a  lung nodule.  She does have a history of breast cancer.  She was given  oral contrast with the abdominal CT and, since, has been nauseated and  vomiting multiple times.  She has not been able to tolerate anything  p.o.  She presented to Dr. Laurey Morale office earlier today with these  symptoms and he referred her to the emergency room.  While here, she  developed mid substernal chest discomfort, a tightness in nature, some  heaviness.  It is not particularly sharp and it does not radiate.  The  emergency room physician has given her IV Zofran and morphine with  minimal relief.   PAST MEDICAL HISTORY:  1. Breast carcinoma, status post mastectomy with reconstructive      surgery.  2. Hypertension.  3. Diagnosis of myocardial infarction based on abnormal EKG anterior Q-      waves.  4. History of asthma.  5. Osteoporosis.  6. Restless leg syndrome.   SOCIAL HISTORY:  Nonsmoker, nondrinker, married, accompanied by her  husband to the emergency room.   FAMILY HISTORY:  Noncontributory.   CURRENT MEDICATIONS:  1. Accolate 20 mg p.o. b.i.d.  2. Allegra 180 mg p.o. daily.  3. Alprazolam 0.5 mg, 1/2 tablet b.i.d.  4. Aspirin 81 mg p.o. daily.  5. Benzonatate 100 mg p.o. daily p.r.n.  6. Boniva 150 mg every month.  7. Caltrate and vitamin D 600 mg twice daily.  8. Crestor 5 mg p.o. daily.  9. HCTZ 25 mg p.o. daily.  10.Micardis 80 mg p.o. daily.  11.Nasonex 1 puff each nostril daily.  12.Nexium 40 mg p.o. daily.  13.KCl 20 mEq p.o. daily.  14.Proventil inhaler q.i.d. p.r.n.  15.Requip 2 mg p.o. nightly.  16.Symbicort 160/4.5 mg, 2 puffs in the a.m. and p.m.  17.Zoloft 15 mg, 1/2 tablet daily.  18.Vitamin B-12, 500 mcg daily.   ALLERGIES:  PENICILLIN.   REVIEW OF SYSTEMS:  Intermittent shortness of breath associated with her  asthma attacks.  No abdominal pain, no diarrhea, constipation,  hematochezia or melena.  No hematemesis.  No cough  or hemoptysis.  No  visual changes or headache.  No difficulty swallowing.  No major joint  pain or swelling.  No neurologic symptoms.   PHYSICAL EXAMINATION:  VITAL SIGNS:  The blood pressure is 149/85, pulse  73 and regular, respiratory rate 24, temperature 97, O2 saturation on 2  liters is 100%.  GENERAL:  This is a comfortable-appearing, 77 year old, Caucasian woman  in no distress.  HEENT: Unremarkable.  Head is atraumatic, normocephalic.  Pupils equal,  round and reactive to light. Sclerae anicteric.  Oral mucosa is pink and  moist.  Tongue is not coated.  NECK:  The neck is supple without thyromegaly or masses.  The carotid  upstrokes are normal.  There is no bruit.  There is no JVD.  CHEST:  Clear with adequate excursion bilaterally.  No wheezes, rales or  rhonchi.  HEART:  Regular rhythm.  Normal S1 and S2 is heard.  No S3, S4, murmur,  click or rub noted.  ABDOMEN:  Soft, nontender.  No midline pulsatile mass.  Bowel sounds  present in all  quadrants.  EXTERNAL GENITALIA:  Without lesions.  RECTAL:  Not performed.  EXTREMITIES:  Show full range of motion.  No edema.  Intact distal  pulses.  NEUROLOGIC:  Cranial nerves II through XII are intact.  Motor and  sensory grossly intact.  Gait not tested.  SKIN:  Warm, dry and clear.   RADIOLOGIC DATA:  Electrocardiogram shows sinus rhythm, chronic ST  elevation with small Q-waves inferiorly.  She has Q-waves in V1 and in  V2 and V3 as well.  This is consistent with anteroseptal infarct, age  77 undetermined.  All of these changes are present on previous EKGs dating  earlier this year.   ASSESSMENT:  1. Presumed unstable angina/acute coronary syndrome.  2. Acute nausea and vomiting, perhaps related to above but, also, the      likely etiology is oral contrast from yesterday.  3. Abnormal Cardiolite.  4. Abnormal ECG, both suggesting coronary disease.  5. History of chronic asthma.  6. Hypertension.  7. Other problems as listed above.   PLAN:  The patient is admitted to telemetry monitoring.  She will have  CK-MB and troponin x2 overnight.  Repeat ECG in the a.m.  We will begin  IV nitroglycerin at 3 with titration and Lovenox at 80 mg subcu q.12 h.  We will inform Dr. Elease Hashimoto of the patient's admission and, perhaps, he  will proceed with cardiac catheterization in the a.m.  We will maintain  NPO and place on the add-on board.      Francisca December, M.D.  Electronically Signed     JHE/MEDQ  D:  12/10/2006  T:  12/11/2006  Job:  [id number]   cc:   Vesta Mixer, M.D.  Redge Gainer. Perini, M.D.

## 2010-09-24 NOTE — Procedures (Signed)
DUPLEX DEEP VENOUS EXAM - LOWER EXTREMITY   INDICATION:  Right leg pain, status post heart catheterization.   HISTORY:  Edema:  Yes  Trauma/Surgery:  Yes  Pain:  Yes  PE:  No  Previous DVT:  Yes  Anticoagulants:  Other:   DUPLEX EXAM:                CFV   SFV   PopV  PTV    GSV                R  L  R  L  R  L  R   L  R  L  Thrombosis    0  0  0     0     0      0  Spontaneous   +  +  +     +     +      +  Phasic        +  +  +     +     +      +  Augmentation  +  +  +     +     +      +  Compressible  +  +  +     +     +      +  Competent     +  +  +     +     +      +   Legend:  + - yes  o - no  p - partial  D - decreased   IMPRESSION:  No evidence of deep venous thrombosis noted in the right  leg.  Questionable hematoma with a measurement of (2.72 x 6.04-cm) noted  in the right groin.   _____________________________  Janetta Hora. Fields, MD   MG/MEDQ  D:  12/28/2006  T:  12/29/2006  Job:  [id number]

## 2010-09-24 NOTE — Assessment & Plan Note (Signed)
[facility]                             PULMONARY OFFICE NOTE   [name], [name]                      MRN:          [id number]  DATE:10/13/2006                            DOB:          1933/03/18    HISTORY OF PRESENT ILLNESS:  [name] is a 77 year old white female  with history of asthmatic bronchitis, reflux disease, significant atopic  features, comes in today with wheezing, shortness of breath, upper  airway irritability, cough, postnasal drainage.  This, despite being on  Accolate 20 mg b.i.d., Allegra 180 mg daily, Nasonex two sprays each  nostril twice daily, Nexium 40 mg b.i.d., Symbicort 160/4.5 two sprays  twice daily, Xolair injections every two weeks, Tussionex and Tessalon  as needed.   PHYSICAL EXAMINATION:  VITAL SIGNS:  Temperature 97, blood pressure  110/70, pulse 84, saturation 97% on room air.  CHEST:  Clear.  There was no evidence of wheeze or rhonchi noted.  CARDIAC:  Regular rate and rhythm without S3.  Normal S1, S2.  ABDOMEN:  Soft, nontender.  EXTREMITIES:  No clubbing or edema.  SKIN:  Clear.   IMPRESSION:  Asthmatic bronchitis significant atopic features.   PLAN:  Plan is for the patient to receive Depo-Medrol injections 120 mg  IM, and she will continue Tussionex/Tessalon Perles as needed for cough.  Maintain other medications as prescribed.  Will see the patient back.  Return follow up in two months.     [name] Cradle Delford Field, MD, [facility]  Electronically Signed    PEW/MedQ  DD: 10/13/2006  DT: 10/13/2006  Job #: [id number]   cc:   Loraine Leriche A. Perini, M.D.

## 2010-09-26 ENCOUNTER — Other Ambulatory Visit: Payer: Self-pay | Admitting: Critical Care Medicine

## 2010-09-27 NOTE — Assessment & Plan Note (Signed)
[facility]                               PULMONARY OFFICE NOTE   [name], [name]                      MRN:          [id number]  DATE:03/10/2006                            DOB:          [birth year]    [name] is a 77 year old white female with a history of asthmatic  bronchitis, reflux disease, allergic rhinitis. She still has some degree of  sore throat and has to use a spacer with her Symbicort. Coughs when laying  flat, the cough is dry. She maintains Boniva monthly, has mild reflux  symptoms with this. She has been off Xolair since August of 2007, feels like  she is worse since being off the Xolair. Maintains Nasonex 2 sprays daily  each nostril, Accolate 20 mg b.i.d., Symbicort 160/4.5 two sprays b.i.d.   PHYSICAL EXAMINATION:  VITAL SIGNS:  Temperature 97.8, blood pressure  100/62, pulse 87, saturation was 97% on room air.  CHEST:  Showed distant breath sounds with no evidence of wheeze or rhonchi.  CARDIAC:  Showed a regular rate and rhythm without S3, normal S1, S2.  ABDOMEN:  Soft, nontender.  EXTREMITIES:  Showed no edema or clubbing  SKIN:  Clear.   IMPRESSION:  Moderate persistent asthma with associated reflux disease.   PLAN:  Stop Boniva for the next 2 months and see if her reflux is better off  the Boniva. Will see the patient back in return followup.     Charlcie Cradle Delford Field, MD, [facility]  Electronically Signed    PEW/MedQ  DD: 03/10/2006  DT: 03/11/2006  Job #: [id number]

## 2010-09-27 NOTE — Assessment & Plan Note (Signed)
[facility]                               PULMONARY OFFICE NOTE   TEIGHAN, AUBERT                      MRN:          [id number]  DATE:12/03/2005                            DOB:          1933-03-08    Ms. Rozeboom is a 77 year old white female with history of asthmatic  bronchitis, allergic rhinitis, severe __________.  This patient is having  increased postnasal drip, some dyspnea, some fatigue but overall is  improved.  Patient maintains Advair 250/50 1 spray b.i.d., Accolate 20 mg  b.i.d.   PHYSICAL EXAMINATION:  VITAL SIGNS:  Temp 97, blood pressure 127/78, pulse  88, saturation 99% on room air.  CHEST:  Distant breath sounds with no wheezes or rhonchi.  CARDIAC:  Regular rate and rhythm without S3.  Normal S1 and S2.  ABDOMEN:  Soft and nontender.  Bowel sounds active.  EXTREMITIES:  No clubbing or edema.   IMPRESSION:  Moderate persistent asthma with some degree of upper airway  irritability on the Advair.   PLAN:  Switch from Advair to Symbicort 160/4.5 2 sprays b.i.d. via HFA,  utilizing a spacer, and then maintain other inhaled medicines as currently  prescribed, and will see the patient back in followup.                                   Charlcie Cradle Delford Field, MD, FCCP   PEW/MedQ  DD:  12/03/2005  DT:  12/03/2005  Job #:  [id number]   cc:   Loraine Leriche A. Perini, MD

## 2010-09-27 NOTE — Discharge Summary (Signed)
[facility]  Patient:    [name], [name]                      MRN: [id number] Adm. Date:  [id number] Disc. Date: [id number] Attending:  Ezequiel Kayser CC:         Sidney Ace, M.D. [facility]   Discharge Summary  ALLERGIST:  Dr. [place] Callas.  DISCHARGE DIAGNOSES:  1. Acute asthma exacerbation in chronic asthmatic patient with severe chronic     asthma.  2. Hypertension.  3. Hyperglycemia due to steroid use this admission.  4. History of degenerative disk disease, status post two previous back     surgeries.  5. Past history of right mastectomy in 1999, for breast cancer; left     mastectomy for fibrocystic disease.  No evidence of disease currently.  6. Total abdominal hysterectomy and bilateral salpingo-oophorectomy.  7. History of cholecystectomy.  8. Allergic rhinitis.  9. Diverticulosis. 10. Gastroesophageal reflux disease.  PROCEDURES:  None.  DISCHARGE MEDICATIONS:  1. Decadron taper as the patient reports intolerance of prednisone.  2. The patient is to stop atenolol.  3. She is to be on captopril 25 mg t.i.d.  4. She will continue other medicines including:     a. Alprazolam 0.5 mg 1/2 tablet to 1 b.i.d.     b. Prevacid 30 mg q.d.     c. Premarin 0.625 mg q.d.     d. Allegra 180 mg q.d.     e. Accolate 20 mg b.i.d.     f. Proventil inhaler p.r.n.     g. Tessalon p.r.n.     h. Meclizine p.r.n.     i. Serevent 2 puffs b.i.d.  5. She will resume her Flovent when her Decadron taper gets to minimal     levels. 6. She is to continue Caltrate with vitamin D.  HISTORY OF PRESENT ILLNESS:  [name] is a pleasant 77 year old female with livelong history of asthma.  She developed a sore throat earlier in the week and saw her allergist, at which time she was given a one-time dose of corticosteroids as well as other treatment.  However, in the last 36 hours she had increasing shortness of breath and dyspnea.  In the emergency room she was found to  have an asthma exacerbation and, despite three nebulizer treatments and a dose of IV Solu-Medrol, continued to have significant wheezing, rhonchi, and decreased breath sounds, with oxygen saturations in the upper 80s on room air.  She was, therefore, admitted for further management.  HOSPITAL COURSE:  [name] was admitted to a telemetry bed for the first 24 hours, given her significant exacerbation of asthma.  This was discontinued in 24 hours.  She was placed on q.6h. Solu-Medrol and q.2h. nebulizer treatment. She was treated empirically with oral azithromycin to cover possible atypical bacterial infection, although chest x-ray revealed no evidence of infiltrate. Given her long-standing history of asthma, she was gradually weaned off her beta blocker in the hospital.  Blood pressures were under acceptable control, and her captopril was slightly increased.  The patient took a few days to improve.  However, she did do so, with improved oxygenation on room air and improved peak flows.  By August 25, 2000, she was deemed stable for discharge home.  ACTIVITY:  No restrictions.  DIET:  No restrictions.  WOUND CARE:  Not applicable.  DISCHARGE INSTRUCTIONS:  She is to call if she has any recurrent problems.  FOLLOW-UP:  She is  to follow up with Dr. [place] Callas in one to two weeks for further care of her asthma.  She is to follow up with Dr. Waynard Edwards in three to four weeks for further primary care management. DD:  08/29/00 TD:  08/31/00 Job: [id number]

## 2010-09-27 NOTE — Assessment & Plan Note (Signed)
[facility]                             PULMONARY OFFICE NOTE   JAMYE, BALICKI                      MRN:          [id number]  DATE:08/20/2006                            DOB:          08-15-1932    Ms. Gohr is a 77 year old white female with a history of asthmatic  bronchitis, allergic rhinitis, severe atopic features, Ig levels of over  300. She is chronic Xolair therapy 300 mg every 2 weeks.   The patient maintains:  1. Accolate 20 mg b.i.d.  2. Allegra 180 mg daily.  3. Alprazolam 0.25 mg twice daily.  4. Boniva 150 mg monthly.  5. Micardis 80 mg twice daily.  6. Nasal wash b.i.d.  7. Nasonex 2 sprays b.i.d. each nostril.  8. Nexium 40 mg b.i.d.  9. Requip 2 mg at h.s.  10.Symbicort 160/4.5 two sprays b.i.d.  11.Zoloft 25 mg h.s.   PHYSICAL EXAMINATION:  This is an elderly female in no distress.  Temperature 97.5, blood pressure 110/60, pulse 82, saturation is 96% on  room air.  CHEST: Showed distant breath sounds but no evidence of wheeze or other  adventitious breath sounds.  CARDIAC: Showed a regular rate and rhythm without S3. Normal S1, S2.  ABDOMEN: Soft, nontender.  EXTREMITIES: Showed no edema or clubbing. No venous disease.  SKIN: Was clear.  NARES: Showed mild nasal inflammation. No purulence.  OROPHARYNX: Was clear with posterior pharyngeal secretions seen.  NEUROLOGIC: The patient is awake, alert and intact.   IMPRESSION:  Impression is that of allergic rhinitis with severe atopic  features, asthmatic bronchitis, reflux disease, vocal cord dysfunction  syndrome.   RECOMMENDATIONS:  Maintain Xolair, Symbicort, Nasonex, Nexium, Allegra,  Accolate as prescribed. We will add to this program Astelin two sprays  each nostril b.i.d. through the allergy season for ongoing severe  allergic rhinitis and then return this patient to this office in 2  months.     Charlcie Cradle Delford Field, MD, [facility]  Electronically Signed    PEW/MedQ  DD: 08/20/2006  DT: 08/20/2006  Job #: [id number]   cc:   Loraine Leriche A. Perini, M.D.

## 2010-09-27 NOTE — H&P (Signed)
[facility]  Patient:    [name], [name]                      MRN: [id number] Adm. Date:  [id number] Attending:  Donnetta Hutching                         History and Physical  ALLERGIST:  Dr. [place] Callas.  PRIMARY CARE PHYSICIAN:  ______.  CHIEF COMPLAINT:  Shortness of breath.  HISTORY OF PRESENT ILLNESS:  Ms. Meares is a 77 year old female with a lifelong history of asthma.  She developed a sore throat earlier in the week. She saw her allergist two days prior to admission at which time she was given a shot of steroids.  However, over the last 36 hours she has had increasing shortness of breath and dyspnea.  She presented to the emergency room today and was found to have an asthma exacerbation.  Despite three nebulizer treatments and a dose of IV Solu-Medrol she continues to have significant wheezing and tightness with oxygen saturation in the upper 80s on room air. She is therefore to be admitted to [facility] for further care.  PAST MEDICAL HISTORY: 1. Degenerative disk disease status post back surgery in 1981 and 1983. 2. Lifelong history of asthma. 3. Status post right mastectomy for breast cancer in 1989.  Status post left    mastectomy for fibrocystic disease. 4. Total abdominal hysterectomy and bilateral salpingo-oophorectomy. 5. History of cholecystectomy 1993. 6. Perennial allergic rhinitis. 7. Labile hypertension. 8. Diverticulosis. 9. GERD.  ALLERGIES:  PENICILLIN, CODEINE.  MEDICATIONS:  1. She does have an albuterol nebulizer she used at home.  2. Atenolol 50 mg one-half b.i.d.  3. Captopril 25 mg one-half b.i.d.  4. Alprazolam 0.5 mg one-half to one p.o. b.i.d.  5. Prevacid 30 mg p.o. q.d.  6. Premarin 0.625 mg q.d.  7. Allegra 180 mg q.d.  8. Accolade 20 mg b.i.d.  9. Proventil p.r.n. 10. Tessalon p.r.n. 11. Meclizine p.r.n. 12. Serevent two puffs b.i.d. 13. Flovent 220 mcg two to four puffs b.i.d. 14. Caltrate with vitamin  D.  SOCIAL HISTORY:  Ms. Lucchetti has been married since 61.  She has no children.  She works Armed forces operational officer for [employer].  She denies any tobacco, alcohol, or drug use.  FAMILY HISTORY:  Father died at age 26 of an MI.  Mother died at age 36 of an MI.  She has three brothers, one who died of cancer at age 59, two that are living, one with pulmonary problems.  She has two sisters that are alive.  REVIEW OF SYSTEMS:  Patient denies any chest pain, nausea, vomiting, or diarrhea.  She does not have a definite fever, but has felt warm at times. She denies any blood from above or below.  She has had a cough which has been mostly nonproductive, although there has been some occasional yellow sputum. She denies any genitourinary symptoms.  PHYSICAL EXAMINATION  VITAL SIGNS:  Pulse 100 in sinus, blood pressure 160/95, respiratory rate 25, temperature 97.4, oxygen saturation 88-90% on room air.  GENERAL:  She is sitting semi supine.  She does have increased work of breathing with some retraction of accessory muscles on respiration.  HEENT:  Pupils are equal, round and reactive to light.  Extraocular movements are intact.  Oropharynx is clear.  NECK:  There is no JVD.  CHEST:  Reveals very tight breath sounds with inspiratory and expiratory wheezes  and rhonchi throughout.  HEART:  Tachycardic with no murmur.  ABDOMEN:  Soft and benign.  No edema.  LABORATORIES:  Chest x-ray reveals no acute disease.  ASSESSMENT AND PLAN:  A 77 year old female with asthma exacerbation.  Admit. Will give q.6h. Solu-Medrol and q.2h. nebulizers and then decrease it to q.3h.  Will treat with supplemental oxygen.  Will obtain pre and post peak flows before and after nebulizer treatments.  Will treat with oral azithromycin to cover for an atypical bacterial infection, although this is doubtful at this time.  We will treat with antitussives and continue other medicines.  Patient is full code status.  We  may need to consider discontinuing her beta blocker therapy and using alternatives for blood pressure. DD:  08/21/00 TD:  08/21/00 Job: [id number]

## 2010-09-27 NOTE — Assessment & Plan Note (Signed)
 [facility]                               PULMONARY OFFICE NOTE   [name], [name]                      MRN:          [id number]  DATE:12/31/2005                            DOB:          Oct 05, 1932    [name] returns today in follow-up and is a 77 year old white female,  history of moderate persistent asthma, underlying lung disease, reflux  disease, vocal cord dysfunction syndrome.  She was switched recently from  Advair to Symbicort 160/4.5 mcg at 2 sprays b.i.d., maintains Accolate 20 mg  b.i.d., Allegra 180 mg daily, Nasonex daily, Nexium 40 mg b.i.d.  She is on  Xolair every 2 weeks 300 mg.  She has been on the Xolair for 18 months and  has not seen any appreciable change in her program.   PHYSICAL EXAMINATION:  VITAL SIGNS:  Temperature 98, blood pressure 124/68,  pulse 86, saturation 96% on room air.  CHEST:  Distant breath sounds with prolonged expiratory phase, no wheeze or  rhonchi noted.  CARDIAC:  A regular rate and rhythm without S3, normal S1, S2.  ABDOMEN:  Soft, nontender.  EXTREMITIES:  No edema or clubbing.  SKIN:  Clear.  NEUROLOGIC:  Intact.  HEENT:  No jugular venous distention or lymphadenopathy.  Oropharynx clear.  Neck supple.   Spirometry is obtained today, showed normal spirometry.   IMPRESSION:  1. Moderate persistent asthma.  2. Significant atopic features.  3. Reflux disease.  4. Allergic rhinitis.   PLAN:  Maintain inhaled medicines as currently dosed.  We will discontinue  further Xolair therapy as she is currently not responding to protocol.                                   Charlcie Cradle Delford Field, MD, FCCP   PEW/MedQ  DD:  01/06/2006  DT:  01/07/2006  Job #:  [id number]

## 2010-09-27 NOTE — Assessment & Plan Note (Signed)
[facility]                             PULMONARY OFFICE NOTE   KYLE, STANSELL                      MRN:          [id number]  DATE:05/06/2006                            DOB:          01-25-1933    Ms. Mi is a 77 year old white female with a history of severe  persistent asthma, reflux disease, allergic rhinitis, significant atopy  with IgE levels over 300.  She is now back on Xolair therapy, dosing the  Xolair at 300 mg every 2 weeks.  She restarted this in November.  Maintains:  1. Nexium 40 mg b.i.d.  2. Nasonex 2 sprays each nostril daily.  3. Allegra 180 mg daily.  4. Accolate 20 mg b.i.d.  5. Micardis 40 mg b.i.d.  6. Alprazolam 0.5 mg b.i.d.  7. Hydrochlorothiazide 1/2 b.i.d.  8. Aspirin 81 mg daily.  9. Caltrate 600 mg b.i.d.  10.Crestor 5 mg daily.  11.Zoloft 25 mg daily.  12.Requip 1 mg at bedtime.  13.Potassium daily.  14.Boniva monthly.   She is noting increased reflux symptoms more recently despite the Nexium  and she is awakened from sleep with this.   EXAM:  Temp 97.  Blood pressure 140/70.  Pulse 87.  Saturation 96% room  air.  GENERAL:  This is a middle-aged female in no distress.  CHEST:  Distant breath sounds with prolonged expiratory phase.  No  wheeze or rhonchi are noted.  CARDIAC:  Regular rate and rhythm without S3.  Normal S1, S2.  ABDOMEN:  Soft, protuberant.  Bowel sounds active.  EXTREMITIES:  No edema or clubbing.  SKIN:  Clear.   IMPRESSION:  Asthmatic bronchitis, stable at this time, with severe  persistent asthma, significant atopic features, and reflux flare.   PLAN:  Switch the Nexium to Zegerid 40 mg twice daily for 1 week and  then down to 40 mg daily until current sample supply expires, then  resume Nexium back to 40 mg b.i.d.  No other  change in the patient's medication profile is made and we will see the  patient back in followup in 6 weeks.     Charlcie Cradle Delford Field, MD, [facility]  Electronically Signed    PEW/MedQ  DD: 05/06/2006  DT: 05/06/2006  Job #: [id number]   cc:   Loraine Leriche A. Perini, M.D.

## 2010-09-27 NOTE — Assessment & Plan Note (Signed)
[facility]                             PULMONARY OFFICE NOTE   [name], [name]                      MRN:          [id number]  DATE:04/06/2006                            DOB:          [birth year]    [name] returns today in followup. A 77 year old white female with a  history of asthmatic bronchitis, reflux disease, allergic rhinitis,  significant atopic features. She is now back on Xolair having just  received her first injection on March 31, 2006. She is complaining  today of head and chest congestion, coughing up thick yellow mucus, she  is on prednisone 20 mg a day, throat is feeling sore and raw. She  maintains:  1. Symbicort 2 sprays b.i.d. 160/4.5 via spacer.  2. Accolate 20 mg b.i.d.  3. Allegra 180 mg daily.  4. Nasonex 2 sprays daily.  5. Nexium 40 mg b.i.d.  6. Micardis daily.  7. Alprazolam 0.5 mg b.i.d.  8. HCTZ 1/2 b.i.d.  9. Aspirin 81 mg daily.  10.Crestor 5 mg daily.  11.Zoloft 25 mg daily.  12.Requip 1 mg h.s.  13.Potassium daily.  14.Nasal wash daily.  15.Boniva has been held.   PHYSICAL EXAMINATION:  GENERAL:  There is significant oral candididasis  noted.  VITAL SIGNS:  Temperature is 98, blood pressure 112/70, pulse 86,  saturation 98% on room air.  HEENT:  Nares showed increased nasal purulence, left greater than right  nares.  CHEST:  Showed distant breath sounds, prolonged respiratory phase, no  wheeze or rhonchi noted.  CARDIAC:  Showed a regular rate and rhythm without S3. Normal S1, S2.  ABDOMEN:  Soft, nontender.  EXTREMITIES:  Showed no edema or clubbing.  SKIN:  Clear.   IMPRESSION:  That of acute oral candidiasis with acute sinusitis.   PLAN:  For the patient to receive Avelox 400 mg a day for 6 days,  Diflucan 200 mg x1 and then 100 mg daily for 5 days total. The patient  will taper prednisone down slowly from 20 mg a day to 0 over the next 10  days. She will continue Xolair therapy,  maintain inhaled medicines as  prescribed with the exception she will hold Symbicort for 1 week due to  upper airway irritation and then resume. Will see the patient back in  followup in 3 weeks.     [name] Cradle Delford Field, MD, [facility]  Electronically Signed    PEW/MedQ  DD: 04/06/2006  DT: 04/06/2006  Job #: [id number]   cc:   [name], M.D.

## 2010-09-27 NOTE — Procedures (Signed)
[place]  Patient:    ATLEE, VILLERS                      MRN: [id number] Proc. Date: 09/08/00 Adm. Date:  [id number] Attending:  Caleb Popp CC:         Rodrigo Ran, M.D.   Procedure Report  PROCEDURE PERFORMED:  Bronchoscopy.  ENDOSCOPIST:  Charlcie Cradle. Delford Field, M.D. [facility]  CHIEF COMPLAINT:  Chronic cough.  Evaluate for upper airway obstruction.  ANESTHESIA:  1% Xylocaine local.  PREOP MEDICATION:  Demerol 40 mg IV push, Versed 4 mg IV push.  DESCRIPTION OF PROCEDURE:  The Olympus video bronchoscope was introduced through the left naris.  The upper airways were visualized and were unremarkable.  The entire tracheobronchial tree was visualized and revealed mild bronchomalacia, especially in the right mainstem bronchus in the membranous portion of this airway, but no endobronchial lesions were seen and specifically no evidence of upper airway obstruction was identified.  There was mild airway inflammation seen as well.  IMPRESSION:  Mild bronchomalacia in combination with mild airway inflammation resulting in sensation of airway obstruction and chronic cough.  RECOMMENDATIONS:  Continue topical corticosteroid therapy and bronchodilator therapy and assess for response. DD:  09/08/00 TD:  09/08/00 Job: [id number]

## 2010-09-30 ENCOUNTER — Ambulatory Visit (INDEPENDENT_AMBULATORY_CARE_PROVIDER_SITE_OTHER): Payer: Medicare Other

## 2010-09-30 DIAGNOSIS — J45909 Unspecified asthma, uncomplicated: Secondary | ICD-10-CM

## 2010-10-01 MED ORDER — OMALIZUMAB 150 MG ~~LOC~~ SOLR
300.0000 mg | Freq: Once | SUBCUTANEOUS | Status: AC
  Administered 2010-10-01: 300 mg via SUBCUTANEOUS

## 2010-10-11 ENCOUNTER — Ambulatory Visit (INDEPENDENT_AMBULATORY_CARE_PROVIDER_SITE_OTHER): Payer: Medicare Other | Admitting: Critical Care Medicine

## 2010-10-11 ENCOUNTER — Encounter: Payer: Self-pay | Admitting: Critical Care Medicine

## 2010-10-11 ENCOUNTER — Ambulatory Visit: Payer: Medicare Other

## 2010-10-11 ENCOUNTER — Ambulatory Visit (INDEPENDENT_AMBULATORY_CARE_PROVIDER_SITE_OTHER): Payer: Medicare Other

## 2010-10-11 DIAGNOSIS — J309 Allergic rhinitis, unspecified: Secondary | ICD-10-CM

## 2010-10-11 DIAGNOSIS — J45909 Unspecified asthma, uncomplicated: Secondary | ICD-10-CM

## 2010-10-11 DIAGNOSIS — J449 Chronic obstructive pulmonary disease, unspecified: Secondary | ICD-10-CM

## 2010-10-11 MED ORDER — AZELASTINE HCL 0.15 % NA SOLN: 2.0000 | Freq: Every day | NASAL | Status: DC

## 2010-10-11 NOTE — Patient Instructions (Addendum)
Astepro Rx sent to [facility] to replace Astelin.  Use two sprays each nostril daily Get your Xolair injection today Have Dr Elease Hashimoto check your Right Leg  Return 2 months

## 2010-10-11 NOTE — Progress Notes (Signed)
Subjective:    Patient ID: [name], female    DOB: 29-May-1932, 77 y.o.   MRN: [id number]  Cough This is a new problem. The current episode started in the past 7 days. The problem has been rapidly improving. The problem occurs constantly. The cough is productive of sputum. Associated symptoms include postnasal drip, rhinorrhea and a sore throat. Pertinent negatives include no chest pain, ear congestion, fever, headaches, heartburn, hemoptysis, nasal congestion, rash, shortness of breath or wheezing. The symptoms are aggravated by cold air. Her past medical history is significant for asthma and pneumonia.   08/30/10 More difficulty with cough.  More wheeze.  Pollen an issue.  Nose will drip, Cough is dry. See symptoms above. Pt denies any significant sore throat, notes nasal congestion no  excess secretions, fever, chills, sweats, unintended weight loss, pleurtic or exertional chest pain, orthopnea PND, or leg swelling Pt denies any increase in rescue therapy over baseline, denies waking up needing it or having any early am or nocturnal exacerbations of coughing/wheezing/or dyspnea. Pt also denies any obvious fluctuation in symptoms with  weather or environmental change or other alleviating or aggravating factors  10/11/10 At last OV we Rx Depo and prednisone .  Had a hard time with pred pulse.  R ankle and foot swells, two weeks Notes some cramping in hand.  Now cough is better.  Notes less dyspnea.  No real chest pain. Pt denies any significant sore throat, nasal congestion or excess secretions, fever, chills, sweats, unintended weight loss, pleurtic or exertional chest pain, orthopnea PND, or leg swelling Pt denies any increase in rescue therapy over baseline, denies waking up needing it or having any early am or nocturnal exacerbations of coughing/wheezing/or dyspnea. Pt also denies any obvious fluctuation in symptoms with  weather or environmental change or other alleviating or aggravating  factors    Past Medical History  Diagnosis Date  . Restless legs syndrome (RLS)   . Osteoporosis, unspecified   . Unspecified essential hypertension   . Other diseases of vocal cords   . Unspecified asthma   . Esophageal reflux   . Allergic rhinitis, cause unspecified   . Obstructive chronic bronchitis without exacerbation      Family History  Problem Relation Age of Onset  . Breast cancer    . Heart disease       History   Social History  . Marital Status: Married    Spouse Name: N/A    Number of Children: N/A  . Years of Education: N/A   Occupational History  . Not on file.   Social History Main Topics  . Smoking status: Never Smoker   . Smokeless tobacco: Never Used  . Alcohol Use: No  . Drug Use: No  . Sexually Active: Not on file   Other Topics Concern  . Not on file   Social History Narrative  . No narrative on file     Allergies  Allergen Reactions  . Cephalosporins     REACTION: rash  . Codeine     REACTION: rash  . Eggs Or Egg-Derived Products   . Levofloxacin     REACTION: rash  . Penicillins     REACTION: rash     Outpatient Prescriptions Prior to Visit  Medication Sig Dispense Refill  . albuterol (PROAIR HFA) 108 (90 BASE) MCG/ACT inhaler Inhale 2 puffs into the lungs every 6 (six) hours as needed.        . ALPRAZolam (XANAX) 0.5 MG tablet Take  1/2 two times a day       . aspirin 81 MG tablet Take 81 mg by mouth daily.        . benzonatate (TESSALON) 100 MG capsule Take 100 mg by mouth 3 (three) times daily as needed.        . budesonide-formoterol (SYMBICORT) 160-4.5 MCG/ACT inhaler Inhale 2 puffs into the lungs 2 (two) times daily.        . Calcium Carbonate-Vitamin D (CALTRATE 600+D) 600-400 MG-UNIT per tablet Take 1 tablet by mouth 2 (two) times daily.        . Cholecalciferol (VITAMIN D3) 1000 UNITS tablet Take 1,000 Units by mouth daily.        . CRESTOR 5 MG tablet TAKE 1 TABLET DAILY  90 tablet  3  . fexofenadine (ALLEGRA) 180  MG tablet Take 180 mg by mouth daily.        [name] guar gum packet Take by mouth as needed.        . hydrochlorothiazide 25 MG tablet Take 25 mg by mouth daily.        [name] ibandronate (BONIVA) 150 MG tablet Take 150 mg by mouth every 30 (thirty) days. Take in the morning with a full glass of water, on an empty stomach, and do not take anything else by mouth or lie down for the next 30 min.       . mometasone (NASONEX) 50 MCG/ACT nasal spray 2 sprays by Nasal route 2 (two) times daily.        [name] NEXIUM 40 MG capsule TAKE 1 CAPSULE TWICE A DAY  180 capsule  3  . omalizumab (XOLAIR) 150 MG injection Inject 150 mg into the skin every 14 (fourteen) days.        . potassium chloride (K-DUR,KLOR-CON) 10 MEQ tablet Two tablets once daily       . pramipexole (MIRAPEX) 0.125 MG tablet 3 tablets at bedtime       . SINGULAIR 10 MG tablet TAKE 1 TABLET AT BEDTIME  90 tablet  3  . Spacer/Aero-Holding Chambers (AEROCHAMBER MV) inhaler by Other route. Use as instructed       . telmisartan (MICARDIS) 40 MG tablet Take 40 mg by mouth daily.        . TUSSICAPS 10-8 MG CP12 Take 1 capsule by mouth At bedtime.      . vitamin B-12 (CYANOCOBALAMIN) 500 MCG tablet Take 500 mcg by mouth daily.        [name] azelastine (ASTELIN) 137 MCG/SPRAY nasal spray 2 sprays by Nasal route 2 (two) times daily. Use in each nostril as directed      . predniSONE (DELTASONE) 10 MG tablet Take 4 for three days, 3 for three days, 2 for three days, 1 for three days and stop   30 tablet  0      Review of Systems  Constitutional: Negative for fever.  HENT: Positive for sore throat, rhinorrhea and postnasal drip.   Respiratory: Positive for cough. Negative for hemoptysis, shortness of breath and wheezing.   Cardiovascular: Negative for chest pain.  Gastrointestinal: Negative for heartburn.  Skin: Negative for rash.  Neurological: Negative for headaches.       Objective:   Physical Exam  Gen: Pleasant, well-nourished, in no distress,  normal  affect  ENT: No lesions,  mouth clear,  oropharynx clear, mod postnasal drip  Neck: No JVD, no TMG, no carotid bruits  Lungs: No use of accessory muscles, no dullness to percussion,  clear without wheeze  Cardiovascular: RRR, heart sounds normal, no murmur or gallops, no peripheral edema  Abdomen: soft and NT, no HSM,  BS normal  Musculoskeletal: No deformities, no cyanosis or clubbing  Neuro: alert, non focal  Skin: Warm, no lesions or rashes        Assessment & Plan:   BRONCHITIS, OBSTRUCTIVE CHRONIC W/O EXACRB Severe persistent asthma with atopy, vocal cord dysfunction, upper airway instability, GERD as ppt features  Asthma with allergic rhinitis Severe persistent asthma with atopy, vocal cord dysfunction, upper airway instability, GERD Note pt with mild RLE edema that does NOT look like DVT, if persists , pt to review with cardiology at upcoming ov in one week Plan No change in inhaled or maintenance medications. Return in   2 months      Updated Medication List Outpatient Encounter Prescriptions as of 10/11/2010  Medication Sig Dispense Refill  . albuterol (PROAIR HFA) 108 (90 BASE) MCG/ACT inhaler Inhale 2 puffs into the lungs every 6 (six) hours as needed.        . ALPRAZolam (XANAX) 0.5 MG tablet Take 1/2 two times a day       . aspirin 81 MG tablet Take 81 mg by mouth daily.        . benzonatate (TESSALON) 100 MG capsule Take 100 mg by mouth 3 (three) times daily as needed.        . budesonide-formoterol (SYMBICORT) 160-4.5 MCG/ACT inhaler Inhale 2 puffs into the lungs 2 (two) times daily.        . Calcium Carbonate-Vitamin D (CALTRATE 600+D) 600-400 MG-UNIT per tablet Take 1 tablet by mouth 2 (two) times daily.        . Cholecalciferol (VITAMIN D3) 1000 UNITS tablet Take 1,000 Units by mouth daily.        . CRESTOR 5 MG tablet TAKE 1 TABLET DAILY  90 tablet  3  . fexofenadine (ALLEGRA) 180 MG tablet Take 180 mg by mouth daily.        [name] guar gum packet Take by  mouth as needed.        . hydrochlorothiazide 25 MG tablet Take 25 mg by mouth daily.        [name] ibandronate (BONIVA) 150 MG tablet Take 150 mg by mouth every 30 (thirty) days. Take in the morning with a full glass of water, on an empty stomach, and do not take anything else by mouth or lie down for the next 30 min.       . mometasone (NASONEX) 50 MCG/ACT nasal spray 2 sprays by Nasal route 2 (two) times daily.        [name] NEXIUM 40 MG capsule TAKE 1 CAPSULE TWICE A DAY  180 capsule  3  . omalizumab (XOLAIR) 150 MG injection Inject 150 mg into the skin every 14 (fourteen) days.        . potassium chloride (K-DUR,KLOR-CON) 10 MEQ tablet Two tablets once daily       . pramipexole (MIRAPEX) 0.125 MG tablet 3 tablets at bedtime       . SINGULAIR 10 MG tablet TAKE 1 TABLET AT BEDTIME  90 tablet  3  . Spacer/Aero-Holding Chambers (AEROCHAMBER MV) inhaler by Other route. Use as instructed       . telmisartan (MICARDIS) 40 MG tablet Take 40 mg by mouth daily.        . TUSSICAPS 10-8 MG CP12 Take 1 capsule by mouth At bedtime.      . vitamin  B-12 (CYANOCOBALAMIN) 500 MCG tablet Take 500 mcg by mouth daily.        [name] DISCONTD: azelastine (ASTELIN) 137 MCG/SPRAY nasal spray 2 sprays by Nasal route 2 (two) times daily. Use in each nostril as directed      . Azelastine HCl (ASTEPRO) 0.15 % SOLN Place 2 puffs into the nose daily.  90 mL  4  . predniSONE (DELTASONE) 10 MG tablet Take 4 for three days, 3 for three days, 2 for three days, 1 for three days and stop   30 tablet  0

## 2010-10-13 MED ORDER — OMALIZUMAB 150 MG ~~LOC~~ SOLR
300.0000 mg | Freq: Once | SUBCUTANEOUS | Status: AC
  Administered 2010-10-13: 300 mg via SUBCUTANEOUS

## 2010-10-13 NOTE — Assessment & Plan Note (Signed)
Severe persistent asthma with atopy, vocal cord dysfunction, upper airway instability, GERD as ppt features

## 2010-10-13 NOTE — Assessment & Plan Note (Addendum)
Severe persistent asthma with atopy, vocal cord dysfunction, upper airway instability, GERD Note pt with mild RLE edema that does NOT look like DVT, if persists , pt to review with cardiology at upcoming ov in one week Plan No change in inhaled or maintenance medications. Return in   2 months

## 2010-10-14 ENCOUNTER — Ambulatory Visit: Payer: Medicare Other

## 2010-10-16 ENCOUNTER — Encounter: Payer: Self-pay | Admitting: *Deleted

## 2010-10-17 ENCOUNTER — Encounter: Payer: Self-pay | Admitting: Cardiovascular Disease

## 2010-10-17 ENCOUNTER — Ambulatory Visit (INDEPENDENT_AMBULATORY_CARE_PROVIDER_SITE_OTHER): Payer: Medicare Other | Admitting: Cardiovascular Disease

## 2010-10-17 DIAGNOSIS — I10 Essential (primary) hypertension: Secondary | ICD-10-CM

## 2010-10-17 DIAGNOSIS — R079 Chest pain, unspecified: Secondary | ICD-10-CM

## 2010-10-17 NOTE — Patient Instructions (Signed)
Walk everyday as tolerated.

## 2010-10-17 NOTE — Assessment & Plan Note (Signed)
Well controlled. Continue current medications  

## 2010-10-17 NOTE — Progress Notes (Signed)
[name] Date of Birth  [birth year] [facility] [address]     [address] [place], [place]  [address] [phone]  Fax  [phone]  History of Present Illness:  Pt with a history of htn, hyperlipidemia.  She has not been feeling well for the past 3 weeks. Fatigue , right leg pain.  Has had right leg swelling recently.   Current Outpatient Prescriptions on File Prior to Visit  Medication Sig Dispense Refill  . albuterol (PROAIR HFA) 108 (90 BASE) MCG/ACT inhaler Inhale 2 puffs into the lungs every 6 (six) hours as needed.        . ALPRAZolam (XANAX) 0.5 MG tablet Take 1/2 two times a day       . aspirin 81 MG tablet Take 81 mg by mouth daily.        . Azelastine HCl (ASTEPRO) 0.15 % SOLN Place 2 puffs into the nose daily.  90 mL  4  . benzonatate (TESSALON) 100 MG capsule Take 100 mg by mouth 3 (three) times daily as needed.        . budesonide-formoterol (SYMBICORT) 160-4.5 MCG/ACT inhaler Inhale 2 puffs into the lungs 2 (two) times daily.        . Calcium Carbonate-Vitamin D (CALTRATE 600+D) 600-400 MG-UNIT per tablet Take 1 tablet by mouth 2 (two) times daily.        . Cholecalciferol (VITAMIN D3) 1000 UNITS tablet Take 1,000 Units by mouth daily.        . CRESTOR 5 MG tablet TAKE 1 TABLET DAILY  90 tablet  3  . fexofenadine (ALLEGRA) 180 MG tablet Take 180 mg by mouth daily.        [name] guar gum packet Take by mouth as needed.        . hydrochlorothiazide 25 MG tablet Take 25 mg by mouth daily.        [name] ibandronate (BONIVA) 150 MG tablet Take 150 mg by mouth every 30 (thirty) days. Take in the morning with a full glass of water, on an empty stomach, and do not take anything else by mouth or lie down for the next 30 min.       . mometasone (NASONEX) 50 MCG/ACT nasal spray 2 sprays by Nasal route 2 (two) times daily.        [name] NEXIUM 40 MG capsule TAKE 1 CAPSULE TWICE A DAY  180 capsule  3  . omalizumab (XOLAIR) 150 MG injection Inject 150  mg into the skin every 14 (fourteen) days.        . potassium chloride (K-DUR,KLOR-CON) 10 MEQ tablet Two tablets once daily       . pramipexole (MIRAPEX) 0.125 MG tablet 3 tablets at bedtime       . SINGULAIR 10 MG tablet TAKE 1 TABLET AT BEDTIME  90 tablet  3  . Spacer/Aero-Holding Chambers (AEROCHAMBER MV) inhaler by Other route. Use as instructed       . telmisartan (MICARDIS) 40 MG tablet Take 40 mg by mouth daily.        . TUSSICAPS 10-8 MG CP12 Take 1 capsule by mouth At bedtime.      . vitamin B-12 (CYANOCOBALAMIN) 500 MCG tablet Take 500 mcg by mouth daily.        . predniSONE (DELTASONE) 10 MG tablet Take 4 for three days, 3 for three days, 2 for three days, 1 for three days and stop   30 tablet  0  Allergies  Allergen Reactions  . Cephalosporins     REACTION: rash  . Codeine     REACTION: rash  . Eggs Or Egg-Derived Products   . Levofloxacin     REACTION: rash  . Penicillins     REACTION: rash    Past Medical History  Diagnosis Date  . Restless legs syndrome (RLS)   . Osteoporosis, unspecified     Knee and hip osteoarthritis bilaterally  . Unspecified essential hypertension   . Other diseases of vocal cords   . Unspecified asthma   . Esophageal reflux   . Allergic rhinitis, cause unspecified   . Obstructive chronic bronchitis without exacerbation   . Hyperlipidemia   . Gallbladder disease   . Degenerative disk disease   . Perennial allergic rhinitis   . Diverticulosis   . Lymphedema     in the right arm  . Osteopenia   . Shingles 2007  . Chronic kidney disease, stage II (mild)   . Cancer     Past Surgical History  Procedure Date  . Cardiac catheterization 08/012008    normal -- EF of 65%  . Mastectomy 1989    right  . Mastectomy 1990    left  . Back surgery 1983    lumbar procedure  . Back surgery 1981    lumbar procedure  . Total abdominal hysterectomy w/ bilateral salpingoophorectomy   . Cholecystectomy 1983    History  Smoking status    . Never Smoker   Smokeless tobacco  . Never Used    History  Alcohol Use No    Family History  Problem Relation Age of Onset  . Heart attack Mother   . Heart attack Father   . Cancer Brother   . Asthma Brother   . Coronary artery disease Sister   . Asthma Mother   . Heart failure Father     Reviw of Systems:  Reviewed in the HPI.  All other systems are negative.  Physical Exam: BP 122/62  Pulse 64  Wt 181 lb (82.101 kg) The patient is alert and oriented x 3.  The mood and affect are normal.  The skin is warm and dry.  Color is normal.  The HEENT exam reveals that the sclera are nonicteric.  The mucous membranes are moist.  The carotids are 2+ without bruits.  There is no thyromegaly.  There is no JVD.  The lungs are clear.  The chest wall is non tender.  The heart exam reveals a regular rate with a normal S1 and S2.  There are no murmurs, gallops, or rubs.  The PMI is not displaced.   Abdominal exam reveals good bowel sounds.  There is no guarding or rebound.  There is no hepatosplenomegaly or tenderness.  There are no masses.  Exam of the legs reveals trace edema on the right.  The legs are without rashes.  The distal pulses are intact.  Cranial nerves II - XII are intact.  Motor and sensory functions are intact.  The gait is normal.  Assessment / Plan:

## 2010-10-17 NOTE — Assessment & Plan Note (Signed)
>>  ASSESSMENT AND PLAN FOR HYPERTENSIVE HEART DISEASE WITHOUT CHF WRITTEN ON 10/17/2010  9:27 AM BY NAHSER, Deloris PingPHILIP J, MD  Well controlled . Continue current medications.

## 2010-10-28 ENCOUNTER — Ambulatory Visit (INDEPENDENT_AMBULATORY_CARE_PROVIDER_SITE_OTHER): Payer: Medicare Other

## 2010-10-28 DIAGNOSIS — J45909 Unspecified asthma, uncomplicated: Secondary | ICD-10-CM

## 2010-10-29 MED ORDER — OMALIZUMAB 150 MG ~~LOC~~ SOLR
300.0000 mg | Freq: Once | SUBCUTANEOUS | Status: AC
  Administered 2010-10-29: 300 mg via SUBCUTANEOUS

## 2010-11-11 ENCOUNTER — Ambulatory Visit: Payer: Medicare Other

## 2010-11-12 ENCOUNTER — Ambulatory Visit (INDEPENDENT_AMBULATORY_CARE_PROVIDER_SITE_OTHER): Payer: Medicare Other

## 2010-11-12 DIAGNOSIS — J45909 Unspecified asthma, uncomplicated: Secondary | ICD-10-CM

## 2010-11-12 MED ORDER — OMALIZUMAB 150 MG ~~LOC~~ SOLR
300.0000 mg | Freq: Once | SUBCUTANEOUS | Status: AC
  Administered 2010-11-12: 300 mg via SUBCUTANEOUS

## 2010-11-19 ENCOUNTER — Other Ambulatory Visit: Payer: Self-pay | Admitting: Critical Care Medicine

## 2010-11-19 NOTE — Telephone Encounter (Signed)
Called spoke with patient who states she is out of her tussicaps and is requesting refills on this.  She reports having nasal congestion w/ clear drainage and PND that she states is normal with the current weather.  Patient will continue to use her allegra, singulair, sinus rinse, astepro and nasonex as directed as she is aware PEW is not in the office this evening but will return tomorrow morning.  Pt is okay with this.  [facility] [place].  Dr Delford Field, may patient have refills on the tussicaps?  Last refill #60 w/ 4 refills 04/2010.  Last ov 10/11/10.

## 2010-11-20 MED ORDER — HYDROCOD POLST-CPM POLST ER 10-8 MG PO CP12: ORAL_CAPSULE | ORAL | Status: DC

## 2010-11-20 NOTE — Telephone Encounter (Signed)
Ok to refill 

## 2010-11-20 NOTE — Telephone Encounter (Signed)
Pt notified of refill and this was called to [facility] in [place].

## 2010-11-26 ENCOUNTER — Ambulatory Visit (INDEPENDENT_AMBULATORY_CARE_PROVIDER_SITE_OTHER): Payer: Medicare Other

## 2010-11-26 DIAGNOSIS — J45909 Unspecified asthma, uncomplicated: Secondary | ICD-10-CM

## 2010-11-27 MED ORDER — OMALIZUMAB 150 MG ~~LOC~~ SOLR
300.0000 mg | Freq: Once | SUBCUTANEOUS | Status: AC
  Administered 2010-11-27: 300 mg via SUBCUTANEOUS

## 2010-11-30 ENCOUNTER — Other Ambulatory Visit: Payer: Self-pay | Admitting: Cardiovascular Disease

## 2010-12-02 ENCOUNTER — Other Ambulatory Visit: Payer: Self-pay | Admitting: *Deleted

## 2010-12-02 ENCOUNTER — Ambulatory Visit (INDEPENDENT_AMBULATORY_CARE_PROVIDER_SITE_OTHER): Payer: Medicare Other | Admitting: Critical Care Medicine

## 2010-12-02 ENCOUNTER — Encounter: Payer: Self-pay | Admitting: Critical Care Medicine

## 2010-12-02 VITALS — BP 112/70 | HR 94 | Temp 98.4°F | Ht 65.0 in | Wt 177.4 lb

## 2010-12-02 DIAGNOSIS — J309 Allergic rhinitis, unspecified: Secondary | ICD-10-CM

## 2010-12-02 DIAGNOSIS — J45909 Unspecified asthma, uncomplicated: Secondary | ICD-10-CM

## 2010-12-02 DIAGNOSIS — J019 Acute sinusitis, unspecified: Secondary | ICD-10-CM

## 2010-12-02 MED ORDER — METHYLPREDNISOLONE ACETATE 80 MG/ML IJ SUSP
120.0000 mg | Freq: Once | INTRAMUSCULAR | Status: AC
  Administered 2010-12-02: 120 mg via INTRAMUSCULAR

## 2010-12-02 MED ORDER — AZITHROMYCIN 250 MG PO TABS: ORAL_TABLET | ORAL | Status: DC

## 2010-12-02 MED ORDER — HYDROCHLOROTHIAZIDE 25 MG PO TABS: 25.0000 mg | ORAL_TABLET | Freq: Every day | ORAL | Status: DC

## 2010-12-02 MED ORDER — AZELASTINE HCL 0.15 % NA SOLN: NASAL | Status: DC

## 2010-12-02 MED ORDER — FEXOFENADINE HCL 180 MG PO TABS: ORAL_TABLET | ORAL | Status: DC

## 2010-12-02 NOTE — Assessment & Plan Note (Signed)
Acute sinusitis with asthma flare Plan Depo medrol im zithromax x 5 days Hold all antihistamine products

## 2010-12-02 NOTE — Progress Notes (Signed)
Subjective:    Patient ID: [name], female    DOB: Mar 18, 1933, 77 y.o.   MRN: [id number]  Cough This is a new problem. The current episode started 1 to 4 weeks ago. The problem has been gradually worsening. The problem occurs hourly. The cough is productive of sputum (clear mucus). Associated symptoms include chest pain, headaches, postnasal drip, rhinorrhea, shortness of breath and wheezing. Pertinent negatives include no ear congestion, fever, heartburn, hemoptysis, nasal congestion, rash or sore throat. Associated symptoms comments: Right sided headaches and R side of nares is sore,  Notes PNDrip  Notes central chest tightness. Her past medical history is significant for asthma and pneumonia.   08/30/10 More difficulty with cough.  More wheeze.  Pollen an issue.  Nose will drip, Cough is dry. See symptoms above. Pt denies any significant sore throat, notes nasal congestion no  excess secretions, fever, chills, sweats, unintended weight loss, pleurtic or exertional chest pain, orthopnea PND, or leg swelling Pt denies any increase in rescue therapy over baseline, denies waking up needing it or having any early am or nocturnal exacerbations of coughing/wheezing/or dyspnea. Pt also denies any obvious fluctuation in symptoms with  weather or environmental change or other alleviating or aggravating factors  10/11/10 At last OV we Rx Depo and prednisone .  Had a hard time with pred pulse.  R ankle and foot swells, two weeks Notes some cramping in hand.  Now cough is better.  Notes less dyspnea.  No real chest pain. Pt denies any significant sore throat, nasal congestion or excess secretions, fever, chills, sweats, unintended weight loss, pleurtic or exertional chest pain, orthopnea PND, or leg swelling Pt denies any increase in rescue therapy over baseline, denies waking up needing it or having any early am or nocturnal exacerbations of coughing/wheezing/or dyspnea. Pt also denies any obvious  fluctuation in symptoms with  weather or environmental change or other alleviating or aggravating factors  12/02/2010 Cough now is worse for one month, notes R sided sinus pressure. See cough form above    Past Medical History  Diagnosis Date  . Restless legs syndrome (RLS)   . Osteoporosis, unspecified     Knee and hip osteoarthritis bilaterally  . Unspecified essential hypertension   . Other diseases of vocal cords   . Unspecified asthma   . Esophageal reflux   . Allergic rhinitis, cause unspecified   . Obstructive chronic bronchitis without exacerbation   . Hyperlipidemia   . Gallbladder disease   . Degenerative disk disease   . Perennial allergic rhinitis   . Diverticulosis   . Lymphedema     in the right arm  . Osteopenia   . Shingles 2007  . Chronic kidney disease, stage II (mild)   . Cancer      Family History  Problem Relation Age of Onset  . Heart attack Mother   . Heart attack Father   . Cancer Brother   . Asthma Brother   . Coronary artery disease Sister   . Asthma Mother   . Heart failure Father      History   Social History  . Marital Status: Married    Spouse Name: N/A    Number of Children: N/A  . Years of Education: N/A   Occupational History  . Not on file.   Social History Main Topics  . Smoking status: Never Smoker   . Smokeless tobacco: Never Used  . Alcohol Use: No  . Drug Use: No  . Sexually  Active: Not on file   Other Topics Concern  . Not on file   Social History Narrative  . No narrative on file     Allergies  Allergen Reactions  . Cephalosporins     REACTION: rash  . Codeine     REACTION: rash  . Eggs Or Egg-Derived Products   . Levofloxacin     REACTION: rash  . Penicillins     REACTION: rash     Outpatient Prescriptions Prior to Visit  Medication Sig Dispense Refill  . albuterol (PROAIR HFA) 108 (90 BASE) MCG/ACT inhaler Inhale 2 puffs into the lungs every 6 (six) hours as needed.        . ALPRAZolam (XANAX)  0.5 MG tablet Take 1/2 two times a day       . aspirin 81 MG tablet Take 81 mg by mouth daily.        . benzonatate (TESSALON) 100 MG capsule Take 100 mg by mouth 3 (three) times daily as needed.        . budesonide-formoterol (SYMBICORT) 160-4.5 MCG/ACT inhaler Inhale 2 puffs into the lungs 2 (two) times daily.        . Calcium Carbonate-Vitamin D (CALTRATE 600+D) 600-400 MG-UNIT per tablet Take 1 tablet by mouth 2 (two) times daily.        . Cholecalciferol (VITAMIN D3) 1000 UNITS tablet Take 1,000 Units by mouth daily.        . CRESTOR 5 MG tablet TAKE 1 TABLET DAILY  90 tablet  3  . guar gum packet Take by mouth as needed.        . Hydrocod Polst-Chlorphen Polst (TUSSICAPS) 10-8 MG CP12 1 capsule by mouth twice daily as needed  60 each  0  . ibandronate (BONIVA) 150 MG tablet Take 150 mg by mouth every 30 (thirty) days. Take in the morning with a full glass of water, on an empty stomach, and do not take anything else by mouth or lie down for the next 30 min.       . mometasone (NASONEX) 50 MCG/ACT nasal spray 2 sprays by Nasal route 2 (two) times daily.        [name] NEXIUM 40 MG capsule TAKE 1 CAPSULE TWICE A DAY  180 capsule  3  . omalizumab (XOLAIR) 150 MG injection Inject 150 mg into the skin every 14 (fourteen) days.        . potassium chloride (K-DUR,KLOR-CON) 10 MEQ tablet Two tablets once daily       . pramipexole (MIRAPEX) 0.125 MG tablet 3 tablets at bedtime       . SINGULAIR 10 MG tablet TAKE 1 TABLET AT BEDTIME  90 tablet  3  . Spacer/Aero-Holding Chambers (AEROCHAMBER MV) inhaler by Other route. Use as instructed       . telmisartan (MICARDIS) 40 MG tablet Take 40 mg by mouth daily.        . vitamin B-12 (CYANOCOBALAMIN) 500 MCG tablet Take 500 mcg by mouth daily.        . Azelastine HCl (ASTEPRO) 0.15 % SOLN Place 2 puffs into the nose daily.  90 mL  4  . fexofenadine (ALLEGRA) 180 MG tablet Take 180 mg by mouth daily.        . hydrochlorothiazide 25 MG tablet Take 25 mg by mouth  daily.        . predniSONE (DELTASONE) 10 MG tablet Take 4 for three days, 3 for three days, 2 for three days, 1  for three days and stop   30 tablet  0   No facility-administered medications prior to visit.      Review of Systems  Constitutional: Negative for fever.  HENT: Positive for rhinorrhea and postnasal drip. Negative for sore throat.   Respiratory: Positive for cough, shortness of breath and wheezing. Negative for hemoptysis.   Cardiovascular: Positive for chest pain.  Gastrointestinal: Negative for heartburn.  Skin: Negative for rash.  Neurological: Positive for headaches.       Objective:   Physical Exam  Gen: Pleasant, well-nourished, in no distress,  normal affect  ENT: No lesions,  mouth clear,  oropharynx clear, mod postnasal drip, R> L nares purulence  Neck: No JVD, no TMG, no carotid bruits  Lungs: No use of accessory muscles, no dullness to percussion, clear without wheeze  Cardiovascular: RRR, heart sounds normal, no murmur or gallops, no peripheral edema  Abdomen: soft and NT, no HSM,  BS normal  Musculoskeletal: No deformities, no cyanosis or clubbing  Neuro: alert, non focal  Skin: Warm, no lesions or rashes        Assessment & Plan:   Asthma with allergic rhinitis Acute sinusitis with asthma flare Plan Depo medrol im zithromax x 5 days Hold all antihistamine products      Updated Medication List Outpatient Encounter Prescriptions as of 12/02/2010  Medication Sig Dispense Refill  . albuterol (PROAIR HFA) 108 (90 BASE) MCG/ACT inhaler Inhale 2 puffs into the lungs every 6 (six) hours as needed.        . ALPRAZolam (XANAX) 0.5 MG tablet Take 1/2 two times a day       . aspirin 81 MG tablet Take 81 mg by mouth daily.        . Azelastine HCl (ASTEPRO) 0.15 % SOLN Hold for 7 days then resume  90 mL  4  . benzonatate (TESSALON) 100 MG capsule Take 100 mg by mouth 3 (three) times daily as needed.        . budesonide-formoterol (SYMBICORT)  160-4.5 MCG/ACT inhaler Inhale 2 puffs into the lungs 2 (two) times daily.        . Calcium Carbonate-Vitamin D (CALTRATE 600+D) 600-400 MG-UNIT per tablet Take 1 tablet by mouth 2 (two) times daily.        . Cholecalciferol (VITAMIN D3) 1000 UNITS tablet Take 1,000 Units by mouth daily.        . CRESTOR 5 MG tablet TAKE 1 TABLET DAILY  90 tablet  3  . fexofenadine (ALLEGRA) 180 MG tablet Hold for 7days then resume      . guar gum packet Take by mouth as needed.        . Hydrocod Polst-Chlorphen Polst (TUSSICAPS) 10-8 MG CP12 1 capsule by mouth twice daily as needed  60 each  0  . ibandronate (BONIVA) 150 MG tablet Take 150 mg by mouth every 30 (thirty) days. Take in the morning with a full glass of water, on an empty stomach, and do not take anything else by mouth or lie down for the next 30 min.       . mometasone (NASONEX) 50 MCG/ACT nasal spray 2 sprays by Nasal route 2 (two) times daily.        [name] NEXIUM 40 MG capsule TAKE 1 CAPSULE TWICE A DAY  180 capsule  3  . omalizumab (XOLAIR) 150 MG injection Inject 150 mg into the skin every 14 (fourteen) days.        . potassium chloride (  K-DUR,KLOR-CON) 10 MEQ tablet Two tablets once daily       . pramipexole (MIRAPEX) 0.125 MG tablet 3 tablets at bedtime       . SINGULAIR 10 MG tablet TAKE 1 TABLET AT BEDTIME  90 tablet  3  . Spacer/Aero-Holding Chambers (AEROCHAMBER MV) inhaler by Other route. Use as instructed       . telmisartan (MICARDIS) 40 MG tablet Take 40 mg by mouth daily.        . vitamin B-12 (CYANOCOBALAMIN) 500 MCG tablet Take 500 mcg by mouth daily.        [name] DISCONTD: Azelastine HCl (ASTEPRO) 0.15 % SOLN Place 2 puffs into the nose daily.  90 mL  4  . DISCONTD: fexofenadine (ALLEGRA) 180 MG tablet Take 180 mg by mouth daily.        [name] DISCONTD: hydrochlorothiazide 25 MG tablet Take 25 mg by mouth daily.        [name] azithromycin (ZITHROMAX) 250 MG tablet Take two then one daily until gone  6 each  0  . DISCONTD: predniSONE (DELTASONE) 10  MG tablet Take 4 for three days, 3 for three days, 2 for three days, 1 for three days and stop   30 tablet  0   Facility-Administered Encounter Medications as of 12/02/2010  Medication Dose Route Frequency Provider Last Rate Last Dose  . methylPREDNISolone acetate (DEPO-MEDROL) injection 120 mg  120 mg Intramuscular Once Shan Levans, MD   120 mg at 12/02/10 1430

## 2010-12-02 NOTE — Telephone Encounter (Signed)
Fax received from pharmacy. Refill completed. Jodette Jaedon Siler RN  

## 2010-12-02 NOTE — Patient Instructions (Signed)
A Depomedrol injection 120mg  will be given Take azithromycin two times one dose then one daily until gone Hold astepro and allegra for 7days then resume May use tussicaps or benzonatate for cough Return 2 months

## 2010-12-11 ENCOUNTER — Telehealth: Payer: Self-pay | Admitting: Critical Care Medicine

## 2010-12-11 ENCOUNTER — Ambulatory Visit (INDEPENDENT_AMBULATORY_CARE_PROVIDER_SITE_OTHER): Payer: Medicare Other

## 2010-12-11 ENCOUNTER — Ambulatory Visit: Payer: Medicare Other | Admitting: Critical Care Medicine

## 2010-12-11 DIAGNOSIS — J45909 Unspecified asthma, uncomplicated: Secondary | ICD-10-CM

## 2010-12-11 MED ORDER — MOMETASONE FUROATE 50 MCG/ACT NA SUSP: NASAL | Status: DC

## 2010-12-11 NOTE — Telephone Encounter (Signed)
Refill sent to [facility] for nasonex. Spoke with pt and notified that this was done.

## 2010-12-13 MED ORDER — OMALIZUMAB 150 MG ~~LOC~~ SOLR
300.0000 mg | Freq: Once | SUBCUTANEOUS | Status: AC
  Administered 2010-12-13: 300 mg via SUBCUTANEOUS

## 2010-12-17 ENCOUNTER — Other Ambulatory Visit: Payer: Self-pay | Admitting: Cardiovascular Disease

## 2010-12-17 DIAGNOSIS — I119 Hypertensive heart disease without heart failure: Secondary | ICD-10-CM

## 2010-12-17 MED ORDER — TELMISARTAN 40 MG PO TABS: 40.0000 mg | ORAL_TABLET | Freq: Every day | ORAL | Status: DC

## 2010-12-17 NOTE — Telephone Encounter (Signed)
Called needing a 90 day refill of Mycardis filled with [facility]. Please call back. I have pulled her chart.

## 2010-12-17 NOTE — Telephone Encounter (Signed)
Sent to pharmacy 

## 2010-12-25 ENCOUNTER — Ambulatory Visit (INDEPENDENT_AMBULATORY_CARE_PROVIDER_SITE_OTHER): Payer: Medicare Other

## 2010-12-25 DIAGNOSIS — J45909 Unspecified asthma, uncomplicated: Secondary | ICD-10-CM

## 2010-12-26 MED ORDER — OMALIZUMAB 150 MG ~~LOC~~ SOLR
300.0000 mg | Freq: Once | SUBCUTANEOUS | Status: AC
  Administered 2010-12-26: 300 mg via SUBCUTANEOUS

## 2011-01-02 ENCOUNTER — Encounter: Payer: Self-pay | Admitting: Internal Medicine

## 2011-01-08 ENCOUNTER — Telehealth: Payer: Self-pay | Admitting: Critical Care Medicine

## 2011-01-08 ENCOUNTER — Ambulatory Visit (INDEPENDENT_AMBULATORY_CARE_PROVIDER_SITE_OTHER): Payer: Medicare Other

## 2011-01-08 DIAGNOSIS — J45909 Unspecified asthma, uncomplicated: Secondary | ICD-10-CM

## 2011-01-10 MED ORDER — OMALIZUMAB 150 MG ~~LOC~~ SOLR
300.0000 mg | Freq: Once | SUBCUTANEOUS | Status: AC
  Administered 2011-01-10: 300 mg via SUBCUTANEOUS

## 2011-01-14 MED ORDER — HYDROCOD POLST-CPM POLST ER 10-8 MG PO CP12: ORAL_CAPSULE | ORAL | Status: DC

## 2011-01-14 NOTE — Telephone Encounter (Signed)
I spoke with pt and she states she needs a refill on her tussicaps. Pt states she is going out of town on Thursday and would like to have this on hand. Last refilled was 11/20/10 and last OV was 12/02/10. Since PW is not in the office this week will forward to the doc of the day. Please advise Dr. Kriste Basque if okay to refill. Thanks  Allergies  Allergen Reactions  . Cephalosporins     REACTION: rash  . Codeine     REACTION: rash  . Eggs Or Egg-Derived Products   . Levofloxacin     REACTION: rash  . Penicillins     REACTION: rash    Carver Fila, CMA

## 2011-01-14 NOTE — Telephone Encounter (Signed)
Patient returning triage's call. °

## 2011-01-14 NOTE — Telephone Encounter (Signed)
Pt aware rx has been phone in

## 2011-01-14 NOTE — Telephone Encounter (Signed)
Msg taken with no information, looks like she needs a refill but unsure of which med. LMTCB.

## 2011-01-23 ENCOUNTER — Ambulatory Visit (INDEPENDENT_AMBULATORY_CARE_PROVIDER_SITE_OTHER): Payer: Medicare Other

## 2011-01-23 DIAGNOSIS — J45909 Unspecified asthma, uncomplicated: Secondary | ICD-10-CM

## 2011-01-23 MED ORDER — OMALIZUMAB 150 MG ~~LOC~~ SOLR
300.0000 mg | SUBCUTANEOUS | Status: DC
  Administered 2011-01-23: 300 mg via SUBCUTANEOUS

## 2011-02-04 ENCOUNTER — Encounter: Payer: Self-pay | Admitting: Critical Care Medicine

## 2011-02-04 ENCOUNTER — Ambulatory Visit (INDEPENDENT_AMBULATORY_CARE_PROVIDER_SITE_OTHER): Payer: Medicare Other | Admitting: Critical Care Medicine

## 2011-02-04 VITALS — BP 110/68 | HR 80 | Temp 97.3°F | Ht 65.0 in | Wt 174.8 lb

## 2011-02-04 DIAGNOSIS — J45909 Unspecified asthma, uncomplicated: Secondary | ICD-10-CM

## 2011-02-04 MED ORDER — ALBUTEROL SULFATE HFA 108 (90 BASE) MCG/ACT IN AERS: 2.0000 | INHALATION_SPRAY | Freq: Four times a day (QID) | RESPIRATORY_TRACT | Status: DC | PRN

## 2011-02-04 MED ORDER — HYDROCOD POLST-CPM POLST ER 10-8 MG PO CP12: ORAL_CAPSULE | ORAL | Status: DC

## 2011-02-04 MED ORDER — BENZONATATE 100 MG PO CAPS: 100.0000 mg | ORAL_CAPSULE | Freq: Three times a day (TID) | ORAL | Status: DC | PRN

## 2011-02-04 MED ORDER — MOMETASONE FUROATE 50 MCG/ACT NA SUSP: NASAL | Status: DC

## 2011-02-04 NOTE — Patient Instructions (Signed)
You declined a flu vaccine Refills sent No change in medications. Return in          2 months Ok for Xolair today

## 2011-02-04 NOTE — Progress Notes (Signed)
Subjective:    Patient ID: [name], female    DOB: [birth year], 77 y.o.   MRN: [id number]  HPI 02/05/2011 No issues  The patient has had no increasing cough and her level of dyspnea is stable. There is no chest pain. There is no excess wheezing. Pt denies any significant sore throat, nasal congestion or excess secretions, fever, chills, sweats, unintended weight loss, pleurtic or exertional chest pain, orthopnea PND, or leg swelling Pt denies any increase in rescue therapy over baseline, denies waking up needing it or having any early am or nocturnal exacerbations of coughing/wheezing/or dyspnea. Pt also denies any obvious fluctuation in symptoms with  weather or environmental change or other alleviating or aggravating factors    Past Medical History  Diagnosis Date  . Restless legs syndrome (RLS)   . Osteoporosis, unspecified     Knee and hip osteoarthritis bilaterally  . Unspecified essential hypertension   . Other diseases of vocal cords   . Unspecified asthma   . Esophageal reflux   . Allergic rhinitis, cause unspecified   . Obstructive chronic bronchitis without exacerbation   . Hyperlipidemia   . Gallbladder disease   . Degenerative disk disease   . Perennial allergic rhinitis   . Diverticulosis   . Lymphedema     in the right arm  . Osteopenia   . Shingles 2007  . Chronic kidney disease, stage II (mild)   . Cancer      Family History  Problem Relation Age of Onset  . Heart attack Mother   . Heart attack Father   . Cancer Brother   . Asthma Brother   . Coronary artery disease Sister   . Asthma Mother   . Heart failure Father      History   Social History  . Marital Status: Married    Spouse Name: N/A    Number of Children: N/A  . Years of Education: N/A   Occupational History  . Not on file.   Social History Main Topics  . Smoking status: Never Smoker   . Smokeless tobacco: Never Used  . Alcohol Use: No  . Drug Use: No  . Sexually Active:  Not on file   Other Topics Concern  . Not on file   Social History Narrative  . No narrative on file     Allergies  Allergen Reactions  . Cephalosporins     REACTION: rash  . Codeine     REACTION: rash  . Eggs Or Egg-Derived Products   . Levofloxacin     REACTION: rash  . Penicillins     REACTION: rash     Outpatient Prescriptions Prior to Visit  Medication Sig Dispense Refill  . ALPRAZolam (XANAX) 0.5 MG tablet Take 1/2 two times a day       . aspirin 81 MG tablet Take 81 mg by mouth daily.        . budesonide-formoterol (SYMBICORT) 160-4.5 MCG/ACT inhaler Inhale 2 puffs into the lungs 2 (two) times daily.        . Calcium Carbonate-Vitamin D (CALTRATE 600+D) 600-400 MG-UNIT per tablet Take 1 tablet by mouth 2 (two) times daily.        . Cholecalciferol (VITAMIN D3) 1000 UNITS tablet Take 1,000 Units by mouth daily.        . CRESTOR 5 MG tablet TAKE 1 TABLET DAILY  90 tablet  3  . guar gum packet Take by mouth as needed.        [name]  hydrochlorothiazide 25 MG tablet Take 1 tablet (25 mg total) by mouth daily.  90 tablet  1  . ibandronate (BONIVA) 150 MG tablet Take 150 mg by mouth every 30 (thirty) days. Take in the morning with a full glass of water, on an empty stomach, and do not take anything else by mouth or lie down for the next 30 min.       [name] NEXIUM 40 MG capsule TAKE 1 CAPSULE TWICE A DAY  180 capsule  3  . omalizumab (XOLAIR) 150 MG injection Inject 150 mg into the skin every 14 (fourteen) days.        . potassium chloride (K-DUR,KLOR-CON) 10 MEQ tablet Two tablets once daily       . pramipexole (MIRAPEX) 0.125 MG tablet 3 tablets at bedtime       . SINGULAIR 10 MG tablet TAKE 1 TABLET AT BEDTIME  90 tablet  3  . Spacer/Aero-Holding Chambers (AEROCHAMBER MV) inhaler by Other route. Use as instructed       . telmisartan (MICARDIS) 40 MG tablet Take 1 tablet (40 mg total) by mouth daily.  90 tablet  3  . vitamin B-12 (CYANOCOBALAMIN) 500 MCG tablet Take 500 mcg by mouth  daily.        [name] albuterol (PROAIR HFA) 108 (90 BASE) MCG/ACT inhaler Inhale 2 puffs into the lungs every 6 (six) hours as needed.        . Azelastine HCl (ASTEPRO) 0.15 % SOLN Hold for 7 days then resume  90 mL  4  . benzonatate (TESSALON) 100 MG capsule Take 100 mg by mouth 3 (three) times daily as needed.        . fexofenadine (ALLEGRA) 180 MG tablet Hold for 7days then resume      . Hydrocod Polst-Chlorphen Polst (TUSSICAPS) 10-8 MG CP12 1 capsule by mouth twice daily as needed  25 each  0  . mometasone (NASONEX) 50 MCG/ACT nasal spray 2 sprays each nostril twice daily  51 g  1  . azithromycin (ZITHROMAX) 250 MG tablet Take two then one daily until gone  6 each  0   Facility-Administered Medications Prior to Visit  Medication Dose Route Frequency Provider Last Rate Last Dose  . omalizumab Geoffry Paradise) injection 300 mg  300 mg Subcutaneous Q28 days Shan Levans, MD   300 mg at 01/23/11 1520      Review of Systems     Objective:   Physical Exam  Gen: Pleasant, well-nourished, in no distress,  normal affect  ENT: No lesions,  mouth clear,  oropharynx clear, mod postnasal drip, R> L nares purulence  Neck: No JVD, no TMG, no carotid bruits  Lungs: No use of accessory muscles, no dullness to percussion, clear without wheeze  Cardiovascular: RRR, heart sounds normal, no murmur or gallops, no peripheral edema  Abdomen: soft and NT, no HSM,  BS normal  Musculoskeletal: No deformities, no cyanosis or clubbing  Neuro: alert, non focal  Skin: Warm, no lesions or rashes        Assessment & Plan:   Asthma with allergic rhinitis Stable moderate persistent asthma with significant allergic factors associated vocal cord dysfunction syndrome Plan Maintain inhaled medications as prescribed Continue Xolair therapy     Updated Medication List Outpatient Encounter Prescriptions as of 02/04/2011  Medication Sig Dispense Refill  . albuterol (PROAIR HFA) 108 (90 BASE) MCG/ACT inhaler  Inhale 2 puffs into the lungs every 6 (six) hours as needed.  1 Inhaler  6  .  ALPRAZolam (XANAX) 0.5 MG tablet Take 1/2 two times a day       . aspirin 81 MG tablet Take 81 mg by mouth daily.        . Azelastine HCl 0.15 % SOLN Place 2 sprays into the nose daily.        . benzonatate (TESSALON) 100 MG capsule Take 1 capsule (100 mg total) by mouth 3 (three) times daily as needed.  90 capsule  6  . budesonide-formoterol (SYMBICORT) 160-4.5 MCG/ACT inhaler Inhale 2 puffs into the lungs 2 (two) times daily.        . Calcium Carbonate-Vitamin D (CALTRATE 600+D) 600-400 MG-UNIT per tablet Take 1 tablet by mouth 2 (two) times daily.        . Cholecalciferol (VITAMIN D3) 1000 UNITS tablet Take 1,000 Units by mouth daily.        . CRESTOR 5 MG tablet TAKE 1 TABLET DAILY  90 tablet  3  . fexofenadine (ALLEGRA) 180 MG tablet Take 180 mg by mouth daily.        [name] guar gum packet Take by mouth as needed.        . hydrochlorothiazide 25 MG tablet Take 1 tablet (25 mg total) by mouth daily.  90 tablet  1  . Hydrocod Polst-Chlorphen Polst (TUSSICAPS) 10-8 MG CP12 1 capsule by mouth twice daily as needed  30 each  0  . ibandronate (BONIVA) 150 MG tablet Take 150 mg by mouth every 30 (thirty) days. Take in the morning with a full glass of water, on an empty stomach, and do not take anything else by mouth or lie down for the next 30 min.       . mometasone (NASONEX) 50 MCG/ACT nasal spray 2 sprays each nostril twice daily  51 g  6  . NEXIUM 40 MG capsule TAKE 1 CAPSULE TWICE A DAY  180 capsule  3  . omalizumab (XOLAIR) 150 MG injection Inject 150 mg into the skin every 14 (fourteen) days.        . potassium chloride (K-DUR,KLOR-CON) 10 MEQ tablet Two tablets once daily       . pramipexole (MIRAPEX) 0.125 MG tablet 3 tablets at bedtime       . SINGULAIR 10 MG tablet TAKE 1 TABLET AT BEDTIME  90 tablet  3  . Spacer/Aero-Holding Chambers (AEROCHAMBER MV) inhaler by Other route. Use as instructed       . telmisartan  (MICARDIS) 40 MG tablet Take 1 tablet (40 mg total) by mouth daily.  90 tablet  3  . vitamin B-12 (CYANOCOBALAMIN) 500 MCG tablet Take 500 mcg by mouth daily.        [name] DISCONTD: albuterol (PROAIR HFA) 108 (90 BASE) MCG/ACT inhaler Inhale 2 puffs into the lungs every 6 (six) hours as needed.        [name] DISCONTD: Azelastine HCl (ASTEPRO) 0.15 % SOLN Hold for 7 days then resume  90 mL  4  . DISCONTD: benzonatate (TESSALON) 100 MG capsule Take 100 mg by mouth 3 (three) times daily as needed.        [name] DISCONTD: benzonatate (TESSALON) 100 MG capsule Take 1 capsule (100 mg total) by mouth 3 (three) times daily as needed.  90 capsule  6  . DISCONTD: fexofenadine (ALLEGRA) 180 MG tablet Hold for 7days then resume      . DISCONTD: Hydrocod Polst-Chlorphen Polst (TUSSICAPS) 10-8 MG CP12 1 capsule by mouth twice daily as needed  25 each  0  . DISCONTD: mometasone (NASONEX) 50 MCG/ACT nasal spray 2 sprays each nostril twice daily  51 g  1  . DISCONTD: azithromycin (ZITHROMAX) 250 MG tablet Take two then one daily until gone  6 each  0   Facility-Administered Encounter Medications as of 02/04/2011  Medication Dose Route Frequency Provider Last Rate Last Dose  . omalizumab Geoffry Paradise) injection 300 mg  300 mg Subcutaneous Once Shan Levans, MD   300 mg at 02/05/11 0908  . DISCONTD: omalizumab Geoffry Paradise) injection 300 mg  300 mg Subcutaneous Q28 days Shan Levans, MD   300 mg at 01/23/11 1520

## 2011-02-05 MED ORDER — OMALIZUMAB 150 MG ~~LOC~~ SOLR
300.0000 mg | Freq: Once | SUBCUTANEOUS | Status: AC
  Administered 2011-02-05: 300 mg via SUBCUTANEOUS

## 2011-02-05 NOTE — Assessment & Plan Note (Signed)
Stable moderate persistent asthma with significant allergic factors associated vocal cord dysfunction syndrome Plan Maintain inhaled medications as prescribed Continue Xolair therapy

## 2011-02-18 ENCOUNTER — Ambulatory Visit (INDEPENDENT_AMBULATORY_CARE_PROVIDER_SITE_OTHER): Payer: Medicare Other

## 2011-02-18 DIAGNOSIS — J45909 Unspecified asthma, uncomplicated: Secondary | ICD-10-CM

## 2011-02-18 MED ORDER — OMALIZUMAB 150 MG ~~LOC~~ SOLR
300.0000 mg | Freq: Once | SUBCUTANEOUS | Status: AC
  Administered 2011-02-18: 300 mg via SUBCUTANEOUS

## 2011-02-24 LAB — COMPREHENSIVE METABOLIC PANEL
Alkaline Phosphatase: 82
BUN: 9
Glucose, Bld: 118 — ABNORMAL HIGH
Potassium: 3.1 — ABNORMAL LOW
Total Bilirubin: 0.9
Total Protein: 6.4

## 2011-02-24 LAB — PROTIME-INR: INR: 0.9

## 2011-02-24 LAB — DIFFERENTIAL
Basophils Absolute: 0
Basophils Relative: 0
Monocytes Relative: 7
Neutro Abs: 5.2
Neutrophils Relative %: 83 — ABNORMAL HIGH

## 2011-02-24 LAB — CBC
HCT: 31.7 — ABNORMAL LOW
HCT: 39.1
Hemoglobin: 13.2
MCHC: 34.2
MCV: 87
MCV: 87.6
Platelets: 212
RDW: 13
RDW: 13.1

## 2011-02-24 LAB — CK TOTAL AND CKMB (NOT AT ARMC)
Relative Index: 3.3 — ABNORMAL HIGH
Total CK: 553 — ABNORMAL HIGH
Total CK: 573 — ABNORMAL HIGH
Total CK: 228 — ABNORMAL HIGH

## 2011-02-24 LAB — BASIC METABOLIC PANEL
BUN: 11
CO2: 25
Chloride: 91 — ABNORMAL LOW
Glucose, Bld: 161 — ABNORMAL HIGH
Potassium: 4.2

## 2011-02-24 LAB — TROPONIN I: Troponin I: 0.01

## 2011-02-24 LAB — CARDIAC PANEL(CRET KIN+CKTOT+MB+TROPI)
Relative Index: 3.5 — ABNORMAL HIGH
Troponin I: 0.02

## 2011-02-24 LAB — POCT CARDIAC MARKERS: Troponin i, poc: <0.05

## 2011-03-04 ENCOUNTER — Ambulatory Visit (INDEPENDENT_AMBULATORY_CARE_PROVIDER_SITE_OTHER): Payer: Medicare Other

## 2011-03-04 DIAGNOSIS — J45909 Unspecified asthma, uncomplicated: Secondary | ICD-10-CM

## 2011-03-05 DIAGNOSIS — J45909 Unspecified asthma, uncomplicated: Secondary | ICD-10-CM

## 2011-03-05 MED ORDER — OMALIZUMAB 150 MG ~~LOC~~ SOLR
300.0000 mg | Freq: Once | SUBCUTANEOUS | Status: AC
  Administered 2011-03-05: 300 mg via SUBCUTANEOUS

## 2011-03-19 ENCOUNTER — Ambulatory Visit (INDEPENDENT_AMBULATORY_CARE_PROVIDER_SITE_OTHER): Payer: Medicare Other

## 2011-03-19 DIAGNOSIS — J45909 Unspecified asthma, uncomplicated: Secondary | ICD-10-CM

## 2011-03-24 DIAGNOSIS — J45909 Unspecified asthma, uncomplicated: Secondary | ICD-10-CM

## 2011-03-24 MED ORDER — OMALIZUMAB 150 MG ~~LOC~~ SOLR
300.0000 mg | Freq: Once | SUBCUTANEOUS | Status: AC
  Administered 2011-03-24: 300 mg via SUBCUTANEOUS

## 2011-04-02 ENCOUNTER — Ambulatory Visit (INDEPENDENT_AMBULATORY_CARE_PROVIDER_SITE_OTHER): Payer: Medicare Other

## 2011-04-02 DIAGNOSIS — J45909 Unspecified asthma, uncomplicated: Secondary | ICD-10-CM

## 2011-04-04 DIAGNOSIS — J45909 Unspecified asthma, uncomplicated: Secondary | ICD-10-CM

## 2011-04-04 MED ORDER — OMALIZUMAB 150 MG ~~LOC~~ SOLR
300.0000 mg | Freq: Once | SUBCUTANEOUS | Status: AC
  Administered 2011-04-04: 300 mg via SUBCUTANEOUS

## 2011-04-08 ENCOUNTER — Ambulatory Visit: Payer: Medicare Other | Admitting: Critical Care Medicine

## 2011-04-09 ENCOUNTER — Encounter: Payer: Self-pay | Admitting: Critical Care Medicine

## 2011-04-09 ENCOUNTER — Ambulatory Visit (INDEPENDENT_AMBULATORY_CARE_PROVIDER_SITE_OTHER): Payer: Medicare Other | Admitting: Critical Care Medicine

## 2011-04-09 DIAGNOSIS — J45909 Unspecified asthma, uncomplicated: Secondary | ICD-10-CM

## 2011-04-09 MED ORDER — PREDNISONE 10 MG PO TABS: ORAL_TABLET | ORAL | Status: DC

## 2011-04-09 NOTE — Progress Notes (Signed)
Subjective:    Patient ID: [name], female    DOB: 1933/04/10, 77 y.o.   MRN: [id number]  HPI  02/04/11 No issues  The patient has had no increasing cough and her level of dyspnea is stable. There is no chest pain. There is no excess wheezing. Pt denies any significant sore throat, nasal congestion or excess secretions, fever, chills, sweats, unintended weight loss, pleurtic or exertional chest pain, orthopnea PND, or leg swelling Pt denies any increase in rescue therapy over baseline, denies waking up needing it or having any early am or nocturnal exacerbations of coughing/wheezing/or dyspnea. Pt also denies any obvious fluctuation in symptoms with  weather or environmental change or other alleviating or aggravating factors  04/10/2011 Notes more issues since weather changed.  Notes over past week, more dyspnea.  ? Middle ear issue. More nasal congestion, uses saline and nasal sprays.  Notes more wheezing.  More dypsnea with exertion.   No chest pain.  No real edema.  No sore throat but is scratchy.  More pndrip. Pt denies any significant  excess secretions, fever, chills, sweats, unintended weight loss, pleurtic or exertional chest pain, orthopnea PND, or leg swelling Pt denies mild  increase in rescue therapy over baseline, but  denies waking up needing it or having any early am or nocturnal exacerbations of coughing/wheezing/or dyspnea. Symptoms worse with change in weather.  Past Medical History  Diagnosis Date  . Restless legs syndrome (RLS)   . Osteoporosis, unspecified     Knee and hip osteoarthritis bilaterally  . Unspecified essential hypertension   . Other diseases of vocal cords   . Unspecified asthma   . Esophageal reflux   . Allergic rhinitis, cause unspecified   . Obstructive chronic bronchitis without exacerbation   . Hyperlipidemia   . Gallbladder disease   . Degenerative disk disease   . Perennial allergic rhinitis   . Diverticulosis   . Lymphedema     in  the right arm  . Osteopenia   . Shingles 2007  . Chronic kidney disease, stage II (mild)   . Cancer      Family History  Problem Relation Age of Onset  . Heart attack Mother   . Heart attack Father   . Cancer Brother   . Asthma Brother   . Coronary artery disease Sister   . Asthma Mother   . Heart failure Father      History   Social History  . Marital Status: Married    Spouse Name: N/A    Number of Children: N/A  . Years of Education: N/A   Occupational History  . Not on file.   Social History Main Topics  . Smoking status: Never Smoker   . Smokeless tobacco: Never Used  . Alcohol Use: No  . Drug Use: No  . Sexually Active: Not on file   Other Topics Concern  . Not on file   Social History Narrative  . No narrative on file     Allergies  Allergen Reactions  . Cephalosporins     REACTION: rash  . Codeine     REACTION: rash  . Eggs Or Egg-Derived Products   . Levofloxacin     REACTION: rash  . Penicillins     REACTION: rash     Outpatient Prescriptions Prior to Visit  Medication Sig Dispense Refill  . albuterol (PROAIR HFA) 108 (90 BASE) MCG/ACT inhaler Inhale 2 puffs into the lungs every 6 (six) hours as needed.  1  Inhaler  6  . ALPRAZolam (XANAX) 0.5 MG tablet Take 1/2 two times a day       . aspirin 81 MG tablet Take 81 mg by mouth daily.        . Azelastine HCl 0.15 % SOLN Place 2 sprays into the nose daily.        . benzonatate (TESSALON) 100 MG capsule Take 1 capsule (100 mg total) by mouth 3 (three) times daily as needed.  90 capsule  6  . budesonide-formoterol (SYMBICORT) 160-4.5 MCG/ACT inhaler Inhale 2 puffs into the lungs 2 (two) times daily.        . Calcium Carbonate-Vitamin D (CALTRATE 600+D) 600-400 MG-UNIT per tablet Take 1 tablet by mouth 2 (two) times daily.        . Cholecalciferol (VITAMIN D3) 1000 UNITS tablet Take 1,000 Units by mouth daily.        . CRESTOR 5 MG tablet TAKE 1 TABLET DAILY  90 tablet  3  . fexofenadine (ALLEGRA)  180 MG tablet Take 180 mg by mouth daily.        [name] guar gum packet Take by mouth as needed.        . hydrochlorothiazide 25 MG tablet Take 1 tablet (25 mg total) by mouth daily.  90 tablet  1  . Hydrocod Polst-Chlorphen Polst (TUSSICAPS) 10-8 MG CP12 1 capsule by mouth twice daily as needed  30 each  0  . ibandronate (BONIVA) 150 MG tablet Take 150 mg by mouth every 30 (thirty) days. Take in the morning with a full glass of water, on an empty stomach, and do not take anything else by mouth or lie down for the next 30 min.       . mometasone (NASONEX) 50 MCG/ACT nasal spray 2 sprays each nostril twice daily  51 g  6  . NEXIUM 40 MG capsule TAKE 1 CAPSULE TWICE A DAY  180 capsule  3  . omalizumab (XOLAIR) 150 MG injection Inject 150 mg into the skin every 14 (fourteen) days.        . potassium chloride (K-DUR,KLOR-CON) 10 MEQ tablet Two tablets once daily       . pramipexole (MIRAPEX) 0.125 MG tablet 3 tablets at bedtime       . SINGULAIR 10 MG tablet TAKE 1 TABLET AT BEDTIME  90 tablet  3  . Spacer/Aero-Holding Chambers (AEROCHAMBER MV) inhaler by Other route. Use as instructed       . telmisartan (MICARDIS) 40 MG tablet Take 1 tablet (40 mg total) by mouth daily.  90 tablet  3  . vitamin B-12 (CYANOCOBALAMIN) 500 MCG tablet Take 500 mcg by mouth daily.            Review of Systems      Objective:   Physical Exam  Gen: Pleasant, well-nourished, in no distress,  normal affect  ENT: No lesions,  mouth clear,  oropharynx clear, mod postnasal drip, R> L nares purulence  Neck: No JVD, no TMG, no carotid bruits  Lungs: No use of accessory muscles, no dullness to percussion, exp  wheeze  Cardiovascular: RRR, heart sounds normal, no murmur or gallops, no peripheral edema  Abdomen: soft and NT, no HSM,  BS normal  Musculoskeletal: No deformities, no cyanosis or clubbing  Neuro: alert, non focal  Skin: Warm, no lesions or rashes        Assessment & Plan:   Asthma with allergic  rhinitis Moderate persistent asthma with flare Plan Pulse  prednisone No change in inhaled or maintenance medications.      Updated Medication List Outpatient Encounter Prescriptions as of 04/09/2011  Medication Sig Dispense Refill  . albuterol (PROAIR HFA) 108 (90 BASE) MCG/ACT inhaler Inhale 2 puffs into the lungs every 6 (six) hours as needed.  1 Inhaler  6  . ALPRAZolam (XANAX) 0.5 MG tablet Take 1/2 two times a day       . aspirin 81 MG tablet Take 81 mg by mouth daily.        . Azelastine HCl 0.15 % SOLN Place 2 sprays into the nose daily.        . benzonatate (TESSALON) 100 MG capsule Take 1 capsule (100 mg total) by mouth 3 (three) times daily as needed.  90 capsule  6  . budesonide-formoterol (SYMBICORT) 160-4.5 MCG/ACT inhaler Inhale 2 puffs into the lungs 2 (two) times daily.        . Calcium Carbonate-Vitamin D (CALTRATE 600+D) 600-400 MG-UNIT per tablet Take 1 tablet by mouth 2 (two) times daily.        . Cholecalciferol (VITAMIN D3) 1000 UNITS tablet Take 1,000 Units by mouth daily.        . CRESTOR 5 MG tablet TAKE 1 TABLET DAILY  90 tablet  3  . fexofenadine (ALLEGRA) 180 MG tablet Take 180 mg by mouth daily.        [name] guar gum packet Take by mouth as needed.        . hydrochlorothiazide 25 MG tablet Take 1 tablet (25 mg total) by mouth daily.  90 tablet  1  . Hydrocod Polst-Chlorphen Polst (TUSSICAPS) 10-8 MG CP12 1 capsule by mouth twice daily as needed  30 each  0  . ibandronate (BONIVA) 150 MG tablet Take 150 mg by mouth every 30 (thirty) days. Take in the morning with a full glass of water, on an empty stomach, and do not take anything else by mouth or lie down for the next 30 min.       . mometasone (NASONEX) 50 MCG/ACT nasal spray 2 sprays each nostril twice daily  51 g  6  . NEXIUM 40 MG capsule TAKE 1 CAPSULE TWICE A DAY  180 capsule  3  . omalizumab (XOLAIR) 150 MG injection Inject 150 mg into the skin every 14 (fourteen) days.        . potassium chloride  (K-DUR,KLOR-CON) 10 MEQ tablet Two tablets once daily       . pramipexole (MIRAPEX) 0.125 MG tablet 3 tablets at bedtime       . SINGULAIR 10 MG tablet TAKE 1 TABLET AT BEDTIME  90 tablet  3  . Spacer/Aero-Holding Chambers (AEROCHAMBER MV) inhaler by Other route. Use as instructed       . telmisartan (MICARDIS) 40 MG tablet Take 1 tablet (40 mg total) by mouth daily.  90 tablet  3  . vitamin B-12 (CYANOCOBALAMIN) 500 MCG tablet Take 500 mcg by mouth daily.        . predniSONE (DELTASONE) 10 MG tablet Take 4 for three days 3 for three days 2 for three days 1 for three days and stop  30 tablet  0

## 2011-04-09 NOTE — Patient Instructions (Signed)
Take prednisone 10mg  Take 4 for three days 3 for three days 2 for three days 1 for three days and stop No other medication changes Return 1 month

## 2011-04-10 NOTE — Assessment & Plan Note (Signed)
Moderate persistent asthma with flare Plan Pulse prednisone No change in inhaled or maintenance medications.

## 2011-04-16 ENCOUNTER — Ambulatory Visit (INDEPENDENT_AMBULATORY_CARE_PROVIDER_SITE_OTHER): Payer: Medicare Other

## 2011-04-16 DIAGNOSIS — J45909 Unspecified asthma, uncomplicated: Secondary | ICD-10-CM

## 2011-04-16 MED ORDER — OMALIZUMAB 150 MG ~~LOC~~ SOLR
300.0000 mg | Freq: Once | SUBCUTANEOUS | Status: AC
  Administered 2011-04-16: 300 mg via SUBCUTANEOUS

## 2011-04-23 ENCOUNTER — Ambulatory Visit: Payer: Medicare Other | Admitting: Cardiovascular Disease

## 2011-04-30 ENCOUNTER — Ambulatory Visit (INDEPENDENT_AMBULATORY_CARE_PROVIDER_SITE_OTHER): Payer: Medicare Other

## 2011-04-30 DIAGNOSIS — J45909 Unspecified asthma, uncomplicated: Secondary | ICD-10-CM

## 2011-04-30 MED ORDER — OMALIZUMAB 150 MG ~~LOC~~ SOLR
300.0000 mg | Freq: Once | SUBCUTANEOUS | Status: AC
  Administered 2011-04-30: 300 mg via SUBCUTANEOUS

## 2011-05-09 ENCOUNTER — Other Ambulatory Visit: Payer: Self-pay | Admitting: Cardiovascular Disease

## 2011-05-14 ENCOUNTER — Ambulatory Visit (INDEPENDENT_AMBULATORY_CARE_PROVIDER_SITE_OTHER): Payer: Medicare Other

## 2011-05-14 DIAGNOSIS — J45909 Unspecified asthma, uncomplicated: Secondary | ICD-10-CM

## 2011-05-16 DIAGNOSIS — J45909 Unspecified asthma, uncomplicated: Secondary | ICD-10-CM | POA: Diagnosis not present

## 2011-05-16 MED ORDER — OMALIZUMAB 150 MG ~~LOC~~ SOLR
300.0000 mg | Freq: Once | SUBCUTANEOUS | Status: AC
  Administered 2011-05-16: 300 mg via SUBCUTANEOUS

## 2011-05-19 ENCOUNTER — Ambulatory Visit (INDEPENDENT_AMBULATORY_CARE_PROVIDER_SITE_OTHER): Payer: Medicare Other | Admitting: Cardiovascular Disease

## 2011-05-19 ENCOUNTER — Encounter: Payer: Self-pay | Admitting: Cardiovascular Disease

## 2011-05-19 DIAGNOSIS — R9431 Abnormal electrocardiogram [ECG] [EKG]: Secondary | ICD-10-CM

## 2011-05-19 DIAGNOSIS — R079 Chest pain, unspecified: Secondary | ICD-10-CM

## 2011-05-19 DIAGNOSIS — R0789 Other chest pain: Secondary | ICD-10-CM | POA: Insufficient documentation

## 2011-05-19 NOTE — Assessment & Plan Note (Signed)
[name] has had episodes of chest pain for years. She's had a normal heart catheterization. Her most recent EKGs shows Q waves in leads V1 through V3 which is suspicious for an anterior septal myocardial infarction. We'll schedule her for an echocardiogram and a Lexiscan Myoview study for further evaluation.

## 2011-05-19 NOTE — Progress Notes (Signed)
[name] Date of Birth  [birth year] [facility]     [place] Office  [address]    [address]   [address] [place], [place]  [address]    [place], [place]  [address] [phone]  Fax  [phone]  [phone]  Fax [phone]   History of Present Illness:  [name] is a 87 her old female with a history of chest pain. She had normal coronary arteries by heart catheterization in August of 2000 a. She's had a history of breast cancer status post right mastectomy in 1989 and status post left mastectomy in 1990. She also has a history of hypertension and hyperlipidemia. She has had problems with her asthma over the winter.    Current Outpatient Prescriptions on File Prior to Visit  Medication Sig Dispense Refill  . albuterol (PROAIR HFA) 108 (90 BASE) MCG/ACT inhaler Inhale 2 puffs into the lungs every 6 (six) hours as needed.  1 Inhaler  6  . ALPRAZolam (XANAX) 0.5 MG tablet Take 0.125mg  Daily      . aspirin 81 MG tablet Take 81 mg by mouth daily.        . Azelastine HCl 0.15 % SOLN Place 2 sprays into the nose daily.        . benzonatate (TESSALON) 100 MG capsule Take 1 capsule (100 mg total) by mouth 3 (three) times daily as needed.  90 capsule  6  . budesonide-formoterol (SYMBICORT) 160-4.5 MCG/ACT inhaler Inhale 2 puffs into the lungs 2 (two) times daily.        . Calcium Carbonate-Vitamin D (CALTRATE 600+D) 600-400 MG-UNIT per tablet Take 1 tablet by mouth 2 (two) times daily.        . Cholecalciferol (VITAMIN D3) 1000 UNITS tablet Take 1,000 Units by mouth daily.        . CRESTOR 5 MG tablet TAKE 1 TABLET DAILY  90 tablet  3  . fexofenadine (ALLEGRA) 180 MG tablet Take 180 mg by mouth daily.        . hydrochlorothiazide (HYDRODIURIL) 25 MG tablet TAKE 1 TABLET DAILY  90 tablet  2  . Hydrocod Polst-Chlorphen Polst (TUSSICAPS) 10-8 MG CP12 1 capsule by mouth twice daily as needed  30 each  0  . ibandronate (BONIVA) 150 MG tablet Take 150 mg by mouth  every 30 (thirty) days. Take in the morning with a full glass of water, on an empty stomach, and do not take anything else by mouth or lie down for the next 30 min.       . mometasone (NASONEX) 50 MCG/ACT nasal spray 2 sprays each nostril twice daily  51 g  6  . NEXIUM 40 MG capsule TAKE 1 CAPSULE TWICE A DAY  180 capsule  3  . omalizumab (XOLAIR) 150 MG injection Inject 150 mg into the skin every 14 (fourteen) days.        . potassium chloride (K-DUR,KLOR-CON) 10 MEQ tablet Two tablets once daily       . pramipexole (MIRAPEX) 0.125 MG tablet 3 tablets at bedtime       . predniSONE (DELTASONE) 10 MG tablet Take 4 for three days 3 for three days 2 for three days 1 for three days and stop  30 tablet  0  . SINGULAIR 10 MG tablet TAKE 1 TABLET AT BEDTIME  90 tablet  3  . Spacer/Aero-Holding Chambers (AEROCHAMBER MV) inhaler by Other route. Use as instructed       .  telmisartan (MICARDIS) 40 MG tablet Take 1 tablet (40 mg total) by mouth daily.  90 tablet  3  . vitamin B-12 (CYANOCOBALAMIN) 500 MCG tablet Take 500 mcg by mouth daily.          Allergies  Allergen Reactions  . Cephalosporins     REACTION: rash  . Codeine     REACTION: rash  . Eggs Or Egg-Derived Products   . Levofloxacin     REACTION: rash  . Penicillins     REACTION: rash    Past Medical History  Diagnosis Date  . Restless legs syndrome (RLS)   . Osteoporosis, unspecified     Knee and hip osteoarthritis bilaterally  . Unspecified essential hypertension   . Other diseases of vocal cords   . Unspecified asthma   . Esophageal reflux   . Allergic rhinitis, cause unspecified   . Obstructive chronic bronchitis without exacerbation   . Hyperlipidemia   . Gallbladder disease   . Degenerative disk disease   . Perennial allergic rhinitis   . Diverticulosis   . Lymphedema     in the right arm  . Osteopenia   . Shingles 2007  . Chronic kidney disease, stage II (mild)   . Cancer     Past Surgical History  Procedure  Date  . Cardiac catheterization 08/012008    normal -- EF of 65%  . Mastectomy 1989    right  . Mastectomy 1990    left  . Back surgery 1983    lumbar procedure  . Back surgery 1981    lumbar procedure  . Total abdominal hysterectomy w/ bilateral salpingoophorectomy   . Cholecystectomy 1983    History  Smoking status  . Never Smoker   Smokeless tobacco  . Never Used    History  Alcohol Use No    Family History  Problem Relation Age of Onset  . Heart attack Mother   . Heart attack Father   . Cancer Brother   . Asthma Brother   . Coronary artery disease Sister   . Asthma Mother   . Heart failure Father     Reviw of Systems:  Reviewed in the HPI.  All other systems are negative.  Physical Exam: BP 131/71  Pulse 81  Ht 5\' 5"  (1.651 m)  Wt 171 lb 12.8 oz (77.928 kg)  BMI 28.59 kg/m2 The patient is alert and oriented x 3.  The mood and affect are normal.   Skin: warm and dry.  Color is normal.    HEENT:   Normocephalic/atraumatic. Her neck is supple. There is no thyromegaly.  Lungs: Lungs are clear to auscultation.   Heart: Regular rate, S1-S2. Distant gallops.    Abdomen: Her abdomen is soft. She has good bowel sounds. There is no hepatosplenomegaly.  Extremities:  No clubbing cyanosis or edema.  Neuro:  Neuro exam is nonfocal. Her cranial nerves are intact. Gait is normal.    ECG: Normal sinus rhythm. She has Q waves in leads V1 through V3 consistent with a previous anteroseptal myocardial infarction. These T wave changes are new.  Assessment / Plan:

## 2011-05-19 NOTE — Patient Instructions (Signed)
Your physician wants you to follow-up in: 6 months with fasting labsYou will receive a reminder letter in the mail two months in advance. If you don't receive a letter, please call our office to schedule the follow-up appointment.   Your physician has requested that you have an echocardiogram. Echocardiography is a painless test that uses sound waves to create images of your heart. It provides your doctor with information about the size and shape of your heart and how well your heart's chambers and valves are working. This procedure takes approximately one hour. There are no restrictions for this procedure.   Your physician has requested that you have a lexiscan myoview. For further information please visit [email or web address] Please follow instruction sheet, as given.

## 2011-05-20 ENCOUNTER — Ambulatory Visit (HOSPITAL_COMMUNITY): Payer: Medicare Other | Attending: Cardiology | Admitting: Radiology

## 2011-05-20 DIAGNOSIS — R079 Chest pain, unspecified: Secondary | ICD-10-CM | POA: Diagnosis not present

## 2011-05-20 DIAGNOSIS — R9431 Abnormal electrocardiogram [ECG] [EKG]: Secondary | ICD-10-CM | POA: Diagnosis not present

## 2011-05-20 DIAGNOSIS — Z853 Personal history of malignant neoplasm of breast: Secondary | ICD-10-CM | POA: Insufficient documentation

## 2011-05-20 DIAGNOSIS — I10 Essential (primary) hypertension: Secondary | ICD-10-CM | POA: Diagnosis not present

## 2011-05-22 ENCOUNTER — Ambulatory Visit (HOSPITAL_COMMUNITY): Payer: Medicare Other | Attending: Cardiology | Admitting: Radiology

## 2011-05-22 DIAGNOSIS — R002 Palpitations: Secondary | ICD-10-CM | POA: Insufficient documentation

## 2011-05-22 DIAGNOSIS — R42 Dizziness and giddiness: Secondary | ICD-10-CM | POA: Insufficient documentation

## 2011-05-22 DIAGNOSIS — R5383 Other fatigue: Secondary | ICD-10-CM | POA: Insufficient documentation

## 2011-05-22 DIAGNOSIS — R0609 Other forms of dyspnea: Secondary | ICD-10-CM | POA: Insufficient documentation

## 2011-05-22 DIAGNOSIS — R0789 Other chest pain: Secondary | ICD-10-CM | POA: Insufficient documentation

## 2011-05-22 DIAGNOSIS — I10 Essential (primary) hypertension: Secondary | ICD-10-CM | POA: Diagnosis not present

## 2011-05-22 DIAGNOSIS — E785 Hyperlipidemia, unspecified: Secondary | ICD-10-CM | POA: Insufficient documentation

## 2011-05-22 DIAGNOSIS — R0602 Shortness of breath: Secondary | ICD-10-CM | POA: Diagnosis not present

## 2011-05-22 DIAGNOSIS — R0989 Other specified symptoms and signs involving the circulatory and respiratory systems: Secondary | ICD-10-CM | POA: Insufficient documentation

## 2011-05-22 DIAGNOSIS — R079 Chest pain, unspecified: Secondary | ICD-10-CM

## 2011-05-22 DIAGNOSIS — R5381 Other malaise: Secondary | ICD-10-CM | POA: Diagnosis not present

## 2011-05-22 DIAGNOSIS — Z8249 Family history of ischemic heart disease and other diseases of the circulatory system: Secondary | ICD-10-CM | POA: Insufficient documentation

## 2011-05-22 DIAGNOSIS — R9431 Abnormal electrocardiogram [ECG] [EKG]: Secondary | ICD-10-CM | POA: Diagnosis not present

## 2011-05-22 MED ORDER — TECHNETIUM TC 99M TETROFOSMIN IV KIT
33.0000 | PACK | Freq: Once | INTRAVENOUS | Status: AC | PRN
  Administered 2011-05-22: 33 via INTRAVENOUS

## 2011-05-22 MED ORDER — TECHNETIUM TC 99M TETROFOSMIN IV KIT
11.0000 | PACK | Freq: Once | INTRAVENOUS | Status: AC | PRN
  Administered 2011-05-22: 11 via INTRAVENOUS

## 2011-05-22 MED ORDER — REGADENOSON 0.4 MG/5ML IV SOLN
0.4000 mg | Freq: Once | INTRAVENOUS | Status: AC
  Administered 2011-05-22: 0.4 mg via INTRAVENOUS

## 2011-05-22 NOTE — Progress Notes (Signed)
[facility] SITE 3 NUCLEAR MED [address] [place] [address] [phone]  Cardiology Nuclear Med [name] is a 78 y.o. female [id number] 1932/09/06   Nuclear Med Background Indication for Stress Test:  Evaluation for Ischemia and Abnormal EKG History: 2008  Echo-EF 55-60%, 2008 Heart Catheterization EF-65%, Nml and 2008 Myocardial Perfusion Study-EF 88% Ant-Apical Ischemia Cardiac Risk Factors: Family History - CAD, Hypertension and Lipids  Symptoms:  Chest Pain/tightness with and without exertion ( last occurrence-yesterday), Dizziness, DOE, Fatigue, Fatigue with Exertion, Palpitations and SOB   Nuclear Pre-Procedure Caffeine/Decaff Intake:  None NPO After: 5:30pm   Lungs:  Clear IV 0.9% NS with Angio Cath:  22g  IV Site: L Antecubital x 1, tolerated well IV Started by:  Irean Hong, RN  Chest Size (in):  38 Implants Cup Size: B  Height: 5\' 5"  (1.651 m)  Weight:  169 lb (76.658 kg)  BMI:  Body mass index is 28.12 kg/(m^2). Tech Comments:  n/a    Nuclear Med Study 1 or 2 day study: 1 day  Stress Test Type:  Lexiscan  Reading MD: Cassell Clement, MD  Order Authorizing Provider:  Kristeen Miss, MD  Resting Radionuclide: Technetium 70m Tetrofosmin  Resting Radionuclide Dose: 11 mCi   Stress Radionuclide:  Technetium 18m Tetrofosmin  Stress Radionuclide Dose: 33 mCi           Stress Protocol Rest HR: 74 Stress HR: 90  Rest BP: 124/67 Stress BP: 124/58  Exercise Time (min): n/a METS: n/a   Predicted Max HR: 144 bpm % Max HR: 62.5 bpm Rate Pressure Product: 21308   Dose of Adenosine (mg):  n/a Dose of Lexiscan: 0.4 mg  Dose of Atropine (mg): n/a Dose of Dobutamine: n/a mcg/kg/min (at max HR)  Stress Test Technologist: Bonnita Levan, RN  Nuclear Technologist:  Domenic Polite, CNMT     Rest Procedure:  Myocardial perfusion imaging was performed at rest 45 minutes following the intravenous administration of Technetium 46m  Tetrofosmin. Rest ECG: NSR  Stress Procedure:  The patient received IV Lexiscan 0.4 mg over 15-seconds.  Technetium 62m Tetrofosmin injected at 30-seconds.  There were no significant changes with Lexiscan.  Quantitative spect images were obtained after a 45 minute delay. Stress ECG: No significant ST segment change suggestive of ischemia.  QPS Raw Data Images:  Normal; no motion artifact; normal heart/lung ratio. Stress Images:  Normal homogeneous uptake in all areas of the myocardium. Rest Images:  Normal homogeneous uptake in all areas of the myocardium. Subtraction (SDS):  No evidence of ischemia. Transient Ischemic Dilatation (Normal <1.22):  .8  Lung/Heart Ratio (Normal <0.45):  .36  Quantitative Gated Spect Images QGS EDV:  48 ml QGS ESV:  7 ml QGS cine images:  NL LV Function; NL Wall Motion QGS EF: 86%  Impression Exercise Capacity:  Lexiscan with no exercise. BP Response:  Normal blood pressure response. Clinical Symptoms:  There is dyspnea. ECG Impression:  No significant ECG changes with Lexiscan. Comparison with Prior Nuclear Study: No significant change from previous study  Overall Impression:  Normal stress nuclear study. No ischemia. Vigorous LV systolic function with EF 86%  Cassell Clement

## 2011-05-23 DIAGNOSIS — R7301 Impaired fasting glucose: Secondary | ICD-10-CM | POA: Diagnosis not present

## 2011-05-23 DIAGNOSIS — E785 Hyperlipidemia, unspecified: Secondary | ICD-10-CM | POA: Diagnosis not present

## 2011-05-23 DIAGNOSIS — M899 Disorder of bone, unspecified: Secondary | ICD-10-CM | POA: Diagnosis not present

## 2011-05-23 DIAGNOSIS — E039 Hypothyroidism, unspecified: Secondary | ICD-10-CM | POA: Diagnosis not present

## 2011-05-23 DIAGNOSIS — R82998 Other abnormal findings in urine: Secondary | ICD-10-CM | POA: Diagnosis not present

## 2011-05-23 DIAGNOSIS — I10 Essential (primary) hypertension: Secondary | ICD-10-CM | POA: Diagnosis not present

## 2011-05-23 DIAGNOSIS — M949 Disorder of cartilage, unspecified: Secondary | ICD-10-CM | POA: Diagnosis not present

## 2011-05-28 ENCOUNTER — Ambulatory Visit (INDEPENDENT_AMBULATORY_CARE_PROVIDER_SITE_OTHER): Payer: Medicare Other

## 2011-05-28 DIAGNOSIS — J45909 Unspecified asthma, uncomplicated: Secondary | ICD-10-CM

## 2011-05-28 MED ORDER — OMALIZUMAB 150 MG ~~LOC~~ SOLR
300.0000 mg | Freq: Once | SUBCUTANEOUS | Status: AC
Start: 2011-05-28 — End: 2011-05-28
  Administered 2011-05-28: 300 mg via SUBCUTANEOUS

## 2011-05-30 NOTE — Progress Notes (Signed)
Addended by: Judithe Modest D on: 05/30/2011 03:53 PM   Modules accepted: Orders

## 2011-06-03 DIAGNOSIS — N182 Chronic kidney disease, stage 2 (mild): Secondary | ICD-10-CM | POA: Diagnosis not present

## 2011-06-03 DIAGNOSIS — D509 Iron deficiency anemia, unspecified: Secondary | ICD-10-CM | POA: Diagnosis not present

## 2011-06-03 DIAGNOSIS — Z0000 Encounter for general adult medical examination without abnormal findings: Secondary | ICD-10-CM | POA: Diagnosis not present

## 2011-06-03 DIAGNOSIS — C50919 Malignant neoplasm of unspecified site of unspecified female breast: Secondary | ICD-10-CM | POA: Diagnosis not present

## 2011-06-03 DIAGNOSIS — Z1212 Encounter for screening for malignant neoplasm of rectum: Secondary | ICD-10-CM | POA: Diagnosis not present

## 2011-06-03 DIAGNOSIS — Z79899 Other long term (current) drug therapy: Secondary | ICD-10-CM | POA: Diagnosis not present

## 2011-06-11 ENCOUNTER — Encounter: Payer: Self-pay | Admitting: Critical Care Medicine

## 2011-06-11 ENCOUNTER — Ambulatory Visit (INDEPENDENT_AMBULATORY_CARE_PROVIDER_SITE_OTHER): Payer: Medicare Other | Admitting: Critical Care Medicine

## 2011-06-11 ENCOUNTER — Ambulatory Visit (INDEPENDENT_AMBULATORY_CARE_PROVIDER_SITE_OTHER): Payer: Medicare Other

## 2011-06-11 VITALS — BP 124/66 | HR 81 | Temp 97.7°F | Ht 65.0 in | Wt 173.4 lb

## 2011-06-11 DIAGNOSIS — J45909 Unspecified asthma, uncomplicated: Secondary | ICD-10-CM

## 2011-06-11 MED ORDER — OMALIZUMAB 150 MG ~~LOC~~ SOLR
300.0000 mg | Freq: Once | SUBCUTANEOUS | Status: AC
Start: 2011-06-11 — End: 2011-06-11
  Administered 2011-06-11: 300 mg via SUBCUTANEOUS

## 2011-06-11 MED ORDER — METHYLPREDNISOLONE ACETATE 80 MG/ML IJ SUSP
120.0000 mg | Freq: Once | INTRAMUSCULAR | Status: AC
  Administered 2011-06-11: 120 mg via INTRAMUSCULAR

## 2011-06-11 NOTE — Assessment & Plan Note (Signed)
Moderate persistent asthma with recent flare d/t weather change Plan depomedrol 120mg IM No change in other medications. Return in      2 months

## 2011-06-11 NOTE — Progress Notes (Signed)
Subjective:    Patient ID: [name], female    DOB: 1932-07-31, 78 y.o.   MRN: [id number]  HPI 78 y.o. Moderate persistent asthma with severe atopy  06/11/2011 Was doing well, and backing off nasal sprays, then with change in weather is worse. Notes more nasal drip.  Coughed and wheezed one week ago.  No fever. No real heartburn.  Throat is scratchy.  Notes more pndrip.   Past Medical History  Diagnosis Date  . Restless legs syndrome (RLS)   . Osteoporosis, unspecified     Knee and hip osteoarthritis bilaterally  . Uncontrolled hypertension as indication for native nephrectomy   . Other diseases of vocal cords   . Bronchospastic airway disease   . Esophageal reflux   . Allergic rhinitis, cause unspecified   . Obstructive chronic bronchitis without exacerbation   . Hyperlipidemia   . Gallbladder disease   . Degenerative disk disease   . Perennial allergic rhinitis   . Diverticulosis   . Lymphedema     in the right arm  . Osteopenia   . Shingles 2007  . Chronic kidney disease, stage II (mild)   . Cancer      Family History  Problem Relation Age of Onset  . Heart attack Mother   . Heart attack Father   . Cancer Brother   . Asthma Brother   . Coronary artery disease Sister   . Asthma Mother   . Heart failure Father      History   Social History  . Marital Status: Married    Spouse Name: N/A    Number of Children: N/A  . Years of Education: N/A   Occupational History  . Not on file.   Social History Main Topics  . Smoking status: Never Smoker   . Smokeless tobacco: Never Used  . Alcohol Use: No  . Drug Use: No  . Sexually Active: Not on file   Other Topics Concern  . Not on file   Social History Narrative  . No narrative on file     Allergies  Allergen Reactions  . Cephalosporins     REACTION: rash  . Codeine     REACTION: rash  . Eggs Or Egg-Derived Products   . Levofloxacin     REACTION: rash  . Penicillins     REACTION: rash      Outpatient Prescriptions Prior to Visit  Medication Sig Dispense Refill  . albuterol (PROAIR HFA) 108 (90 BASE) MCG/ACT inhaler Inhale 2 puffs into the lungs every 6 (six) hours as needed.  1 Inhaler  6  . ALPRAZolam (XANAX) 0.5 MG tablet Take 0.125mg  twice daily      . aspirin 81 MG tablet Take 81 mg by mouth daily.        . Azelastine HCl 0.15 % SOLN Place 2 sprays into the nose daily.       . benzonatate (TESSALON) 100 MG capsule Take 1 capsule (100 mg total) by mouth 3 (three) times daily as needed.  90 capsule  6  . budesonide-formoterol (SYMBICORT) 160-4.5 MCG/ACT inhaler Inhale 2 puffs into the lungs 2 (two) times daily.        . Calcium Carbonate-Vitamin D (CALTRATE 600+D) 600-400 MG-UNIT per tablet Take 1 tablet by mouth 2 (two) times daily.        . Cholecalciferol (VITAMIN D3) 1000 UNITS tablet Take 1,000 Units by mouth daily.        . CRESTOR 5 MG tablet TAKE 1  TABLET DAILY  90 tablet  3  . fexofenadine (ALLEGRA) 180 MG tablet Take 180 mg by mouth daily.        . hydrochlorothiazide (HYDRODIURIL) 25 MG tablet TAKE 1 TABLET DAILY  90 tablet  2  . Hydrocod Polst-Chlorphen Polst (TUSSICAPS) 10-8 MG CP12 1 capsule by mouth twice daily as needed  30 each  0  . ibandronate (BONIVA) 150 MG tablet Take 150 mg by mouth every 30 (thirty) days. Take in the morning with a full glass of water, on an empty stomach, and do not take anything else by mouth or lie down for the next 30 min.       . mometasone (NASONEX) 50 MCG/ACT nasal spray 2 sprays each nostril twice daily  51 g  6  . NEXIUM 40 MG capsule TAKE 1 CAPSULE TWICE A DAY  180 capsule  3  . omalizumab (XOLAIR) 150 MG injection Inject 150 mg into the skin every 14 (fourteen) days.        . potassium chloride (K-DUR,KLOR-CON) 10 MEQ tablet Two tablets once daily       . pramipexole (MIRAPEX) 0.125 MG tablet 3 tablets at bedtime       . SINGULAIR 10 MG tablet TAKE 1 TABLET AT BEDTIME  90 tablet  3  . Spacer/Aero-Holding Chambers  (AEROCHAMBER MV) inhaler by Other route. Use as instructed       . telmisartan (MICARDIS) 40 MG tablet Take 1 tablet (40 mg total) by mouth daily.  90 tablet  3  . vitamin B-12 (CYANOCOBALAMIN) 500 MCG tablet Take 500 mcg by mouth daily.        . predniSONE (DELTASONE) 10 MG tablet Take 4 for three days 3 for three days 2 for three days 1 for three days and stop  30 tablet  0   No facility-administered medications prior to visit.      Review of Systems      Objective:   Physical Exam BP 124/66  Pulse 81  Temp(Src) 97.7 F (36.5 C) (Oral)  Ht 5\' 5"  (1.651 m)  Wt 173 lb 6.4 oz (78.654 kg)  BMI 28.86 kg/m2  SpO2 96%  Gen: Pleasant, well-nourished, in no distress,  normal affect  ENT: No lesions,  mouth clear,  oropharynx clear, mod postnasal drip, R> L nares purulence  Neck: No JVD, no TMG, no carotid bruits  Lungs: No use of accessory muscles, no dullness to percussion, exp  wheeze  Cardiovascular: RRR, heart sounds normal, no murmur or gallops, no peripheral edema  Abdomen: soft and NT, no HSM,  BS normal  Musculoskeletal: No deformities, no cyanosis or clubbing  Neuro: alert, non focal  Skin: Warm, no lesions or rashes        Assessment & Plan:   Asthma with allergic rhinitis Moderate persistent asthma with recent flare d/t weather change Plan depomedrol 120mg IM No change in other medications. Return in      2 months         Updated Medication List Outpatient Encounter Prescriptions as of 06/11/2011  Medication Sig Dispense Refill  . albuterol (PROAIR HFA) 108 (90 BASE) MCG/ACT inhaler Inhale 2 puffs into the lungs every 6 (six) hours as needed.  1 Inhaler  6  . ALPRAZolam (XANAX) 0.5 MG tablet Take 0.125mg  twice daily      . aspirin 81 MG tablet Take 81 mg by mouth daily.        . Azelastine HCl 0.15 % SOLN Place 2 sprays  into the nose daily.       . benzonatate (TESSALON) 100 MG capsule Take 1 capsule (100 mg total) by mouth 3 (three) times daily as  needed.  90 capsule  6  . budesonide-formoterol (SYMBICORT) 160-4.5 MCG/ACT inhaler Inhale 2 puffs into the lungs 2 (two) times daily.        . Calcium Carbonate-Vitamin D (CALTRATE 600+D) 600-400 MG-UNIT per tablet Take 1 tablet by mouth 2 (two) times daily.        . Cholecalciferol (VITAMIN D3) 1000 UNITS tablet Take 1,000 Units by mouth daily.        . CRESTOR 5 MG tablet TAKE 1 TABLET DAILY  90 tablet  3  . fexofenadine (ALLEGRA) 180 MG tablet Take 180 mg by mouth daily.        . hydrochlorothiazide (HYDRODIURIL) 25 MG tablet TAKE 1 TABLET DAILY  90 tablet  2  . Hydrocod Polst-Chlorphen Polst (TUSSICAPS) 10-8 MG CP12 1 capsule by mouth twice daily as needed  30 each  0  . ibandronate (BONIVA) 150 MG tablet Take 150 mg by mouth every 30 (thirty) days. Take in the morning with a full glass of water, on an empty stomach, and do not take anything else by mouth or lie down for the next 30 min.       . mometasone (NASONEX) 50 MCG/ACT nasal spray 2 sprays each nostril twice daily  51 g  6  . NEXIUM 40 MG capsule TAKE 1 CAPSULE TWICE A DAY  180 capsule  3  . omalizumab (XOLAIR) 150 MG injection Inject 150 mg into the skin every 14 (fourteen) days.        . potassium chloride (K-DUR,KLOR-CON) 10 MEQ tablet Two tablets once daily       . pramipexole (MIRAPEX) 0.125 MG tablet 3 tablets at bedtime       . SINGULAIR 10 MG tablet TAKE 1 TABLET AT BEDTIME  90 tablet  3  . Spacer/Aero-Holding Chambers (AEROCHAMBER MV) inhaler by Other route. Use as instructed       . telmisartan (MICARDIS) 40 MG tablet Take 1 tablet (40 mg total) by mouth daily.  90 tablet  3  . vitamin B-12 (CYANOCOBALAMIN) 500 MCG tablet Take 500 mcg by mouth daily.        [name] DISCONTD: predniSONE (DELTASONE) 10 MG tablet Take 4 for three days 3 for three days 2 for three days 1 for three days and stop  30 tablet  0   Facility-Administered Encounter Medications as of 06/11/2011  Medication Dose Route Frequency Provider Last Rate Last Dose   . methylPREDNISolone acetate (DEPO-MEDROL) injection 120 mg  120 mg Intramuscular Once Shan Levans, MD   120 mg at 06/11/11 0930

## 2011-06-11 NOTE — Patient Instructions (Signed)
Depomedrol 120mg  Injection will be given No change in medications. Return in      2 months

## 2011-06-23 DIAGNOSIS — Z23 Encounter for immunization: Secondary | ICD-10-CM | POA: Diagnosis not present

## 2011-06-25 ENCOUNTER — Ambulatory Visit (INDEPENDENT_AMBULATORY_CARE_PROVIDER_SITE_OTHER): Payer: Medicare Other

## 2011-06-25 DIAGNOSIS — J45909 Unspecified asthma, uncomplicated: Secondary | ICD-10-CM | POA: Diagnosis not present

## 2011-06-25 MED ORDER — OMALIZUMAB 150 MG ~~LOC~~ SOLR
300.0000 mg | Freq: Once | SUBCUTANEOUS | Status: AC
Start: 2011-06-25 — End: 2011-06-25
  Administered 2011-06-25: 300 mg via SUBCUTANEOUS

## 2011-07-09 ENCOUNTER — Ambulatory Visit (INDEPENDENT_AMBULATORY_CARE_PROVIDER_SITE_OTHER): Payer: Medicare Other

## 2011-07-09 DIAGNOSIS — J45909 Unspecified asthma, uncomplicated: Secondary | ICD-10-CM

## 2011-07-09 MED ORDER — OMALIZUMAB 150 MG ~~LOC~~ SOLR
300.0000 mg | Freq: Once | SUBCUTANEOUS | Status: AC
Start: 2011-07-09 — End: 2011-07-09
  Administered 2011-07-09: 300 mg via SUBCUTANEOUS

## 2011-07-11 ENCOUNTER — Other Ambulatory Visit: Payer: Self-pay | Admitting: Critical Care Medicine

## 2011-07-12 ENCOUNTER — Other Ambulatory Visit: Payer: Self-pay | Admitting: Cardiovascular Disease

## 2011-07-23 ENCOUNTER — Ambulatory Visit (INDEPENDENT_AMBULATORY_CARE_PROVIDER_SITE_OTHER): Payer: Medicare Other

## 2011-07-23 DIAGNOSIS — J45909 Unspecified asthma, uncomplicated: Secondary | ICD-10-CM | POA: Diagnosis not present

## 2011-07-23 MED ORDER — OMALIZUMAB 150 MG ~~LOC~~ SOLR
300.0000 mg | Freq: Once | SUBCUTANEOUS | Status: AC
  Administered 2011-07-23: 300 mg via SUBCUTANEOUS

## 2011-08-06 ENCOUNTER — Ambulatory Visit (INDEPENDENT_AMBULATORY_CARE_PROVIDER_SITE_OTHER): Payer: Medicare Other

## 2011-08-06 DIAGNOSIS — J45909 Unspecified asthma, uncomplicated: Secondary | ICD-10-CM

## 2011-08-07 DIAGNOSIS — J45909 Unspecified asthma, uncomplicated: Secondary | ICD-10-CM

## 2011-08-07 MED ORDER — OMALIZUMAB 150 MG ~~LOC~~ SOLR
300.0000 mg | Freq: Once | SUBCUTANEOUS | Status: AC
  Administered 2011-08-07: 300 mg via SUBCUTANEOUS

## 2011-08-20 ENCOUNTER — Ambulatory Visit: Payer: Medicare Other | Admitting: Critical Care Medicine

## 2011-08-20 ENCOUNTER — Ambulatory Visit: Payer: Medicare Other

## 2011-08-21 ENCOUNTER — Ambulatory Visit (INDEPENDENT_AMBULATORY_CARE_PROVIDER_SITE_OTHER): Payer: Medicare Other

## 2011-08-21 ENCOUNTER — Encounter: Payer: Self-pay | Admitting: Adult Health

## 2011-08-21 ENCOUNTER — Ambulatory Visit (INDEPENDENT_AMBULATORY_CARE_PROVIDER_SITE_OTHER): Payer: Medicare Other | Admitting: Adult Health

## 2011-08-21 VITALS — BP 116/72 | HR 78 | Temp 97.3°F | Ht 65.0 in | Wt 171.4 lb

## 2011-08-21 DIAGNOSIS — J45909 Unspecified asthma, uncomplicated: Secondary | ICD-10-CM | POA: Diagnosis not present

## 2011-08-21 MED ORDER — METHYLPREDNISOLONE ACETATE 80 MG/ML IJ SUSP
120.0000 mg | Freq: Once | INTRAMUSCULAR | Status: AC
  Administered 2011-08-21: 120 mg via INTRAMUSCULAR

## 2011-08-21 MED ORDER — AZITHROMYCIN 250 MG PO TABS: ORAL_TABLET | ORAL | Status: AC

## 2011-08-21 NOTE — Patient Instructions (Addendum)
Depomedrol 120mg  Injection will be given Zpack take as directed.  Mucinex DM Twice daily  As needed  Cough/congestion  Fluids and rest  Saline nasal rinses As needed   Please contact office for sooner follow up if symptoms do not improve or worsen or seek emergency care  follow up Dr. Delford Field  In 1 month and As needed

## 2011-08-22 DIAGNOSIS — J45909 Unspecified asthma, uncomplicated: Secondary | ICD-10-CM

## 2011-08-22 MED ORDER — OMALIZUMAB 150 MG ~~LOC~~ SOLR
300.0000 mg | Freq: Once | SUBCUTANEOUS | Status: AC
  Administered 2011-08-22: 300 mg via SUBCUTANEOUS

## 2011-08-22 NOTE — Progress Notes (Signed)
Subjective:    Patient ID: [name], female    DOB: 1933/01/25, 78 y.o.   MRN: [id number]  HPI 78 y.o. Moderate persistent asthma with severe atopy  06/11/11  Was doing well, and backing off nasal sprays, then with change in weather is worse. Notes more nasal drip.  Coughed and wheezed one week ago.  No fever. No real heartburn.  Throat is scratchy.  Notes more pndrip.  08/21/11 Acute OV  Complains of head congestion, PND, clear nasal drainage, increased SOB, increased SOB x3days.  Requests Depo shot today "it is the only thing that works"  No fever or chest pain . No edema otc not working   Past Medical History  Diagnosis Date  . Restless legs syndrome (RLS)   . Osteoporosis, unspecified     Knee and hip osteoarthritis bilaterally  . Unspecified essential hypertension   . Other diseases of vocal cords   . Unspecified asthma   . Esophageal reflux   . Allergic rhinitis, cause unspecified   . Obstructive chronic bronchitis without exacerbation   . Hyperlipidemia   . Gallbladder disease   . Degenerative disk disease   . Perennial allergic rhinitis   . Diverticulosis   . Lymphedema     in the right arm  . Osteopenia   . Shingles 2007  . Chronic kidney disease, stage II (mild)   . Cancer      Family History  Problem Relation Age of Onset  . Heart attack Mother   . Heart attack Father   . Cancer Brother   . Asthma Brother   . Coronary artery disease Sister   . Asthma Mother   . Heart failure Father      History   Social History  . Marital Status: Married    Spouse Name: N/A    Number of Children: N/A  . Years of Education: N/A   Occupational History  . Not on file.   Social History Main Topics  . Smoking status: Never Smoker   . Smokeless tobacco: Never Used  . Alcohol Use: No  . Drug Use: No  . Sexually Active: Not on file   Other Topics Concern  . Not on file   Social History Narrative  . No narrative on file     Allergies  Allergen  Reactions  . Cephalosporins     REACTION: rash  . Codeine     REACTION: rash  . Eggs Or Egg-Derived Products   . Levofloxacin     REACTION: rash  . Penicillins     REACTION: rash     Outpatient Prescriptions Prior to Visit  Medication Sig Dispense Refill  . albuterol (PROAIR HFA) 108 (90 BASE) MCG/ACT inhaler Inhale 2 puffs into the lungs every 6 (six) hours as needed.  1 Inhaler  6  . ALPRAZolam (XANAX) 0.5 MG tablet Take 1/2 tab twice daily as needed      . aspirin 81 MG tablet Take 81 mg by mouth daily.        . Azelastine HCl 0.15 % SOLN Place 2 sprays into the nose daily.       . benzonatate (TESSALON) 100 MG capsule Take 1 capsule (100 mg total) by mouth 3 (three) times daily as needed.  90 capsule  6  . Calcium Carbonate-Vitamin D (CALTRATE 600+D) 600-400 MG-UNIT per tablet Take 1 tablet by mouth 2 (two) times daily.        . Cholecalciferol (VITAMIN D3) 1000 UNITS tablet Take 1,000  Units by mouth daily.        . CRESTOR 5 MG tablet TAKE 1 TABLET DAILY  90 tablet  2  . fexofenadine (ALLEGRA) 180 MG tablet Take 180 mg by mouth daily.        . hydrochlorothiazide (HYDRODIURIL) 25 MG tablet TAKE 1 TABLET DAILY  90 tablet  2  . ibandronate (BONIVA) 150 MG tablet Take 150 mg by mouth every 30 (thirty) days. Take in the morning with a full glass of water, on an empty stomach, and do not take anything else by mouth or lie down for the next 30 min.       . mometasone (NASONEX) 50 MCG/ACT nasal spray 2 sprays each nostril twice daily  51 g  6  . NEXIUM 40 MG capsule TAKE 1 CAPSULE TWICE A DAY  180 capsule  3  . omalizumab (XOLAIR) 150 MG injection Inject 150 mg into the skin every 14 (fourteen) days.        . potassium chloride (K-DUR,KLOR-CON) 10 MEQ tablet Two tablets once daily       . pramipexole (MIRAPEX) 0.125 MG tablet 3 tablets at bedtime       . SINGULAIR 10 MG tablet TAKE 1 TABLET AT BEDTIME  90 tablet  3  . Spacer/Aero-Holding Chambers (AEROCHAMBER MV) inhaler by Other route.  Use as instructed       . SYMBICORT 160-4.5 MCG/ACT inhaler INHALE 2 PUFFS TWICE A DAY  1 Inhaler  1  . telmisartan (MICARDIS) 40 MG tablet Take 1 tablet (40 mg total) by mouth daily.  90 tablet  3  . vitamin B-12 (CYANOCOBALAMIN) 500 MCG tablet Take 500 mcg by mouth daily.        . Hydrocod Polst-Chlorphen Polst (TUSSICAPS) 10-8 MG CP12 1 capsule by mouth twice daily as needed  30 each  0   No facility-administered medications prior to visit.      Review of Systems  .Constitutional:   No  weight loss, night sweats,  Fevers, chills,  _+fatigue, or  lassitude.  HEENT:   No headaches,  Difficulty swallowing,  Tooth/dental problems, or  Sore throat,                No sneezing, itching, ear ache,  +nasal congestion, post nasal drip,   CV:  No chest pain,  Orthopnea, PND, swelling in lower extremities, anasarca, dizziness, palpitations, syncope.   GI  No heartburn, indigestion, abdominal pain, nausea, vomiting, diarrhea, change in bowel habits, loss of appetite, bloody stools.   Resp:    No coughing up of blood.    No chest wall deformity  Skin: no rash or lesions.  GU: no dysuria, change in color of urine, no urgency or frequency.  No flank pain, no hematuria   MS:  No joint pain or swelling.  No decreased range of motion.  No back pain.  Psych:  No change in mood or affect. No depression or anxiety.  No memory loss.         Objective:   Physical Exam BP 116/72  Pulse 78  Temp(Src) 97.3 F (36.3 C) (Oral)  Ht 5\' 5"  (1.651 m)  Wt 171 lb 6.4 oz (77.747 kg)  BMI 28.52 kg/m2  SpO2 95%  Gen: Pleasant, well-nourished, in no distress,  normal affect  ENT: No lesions,  mouth clear,  oropharynx clear, + postnasal drip  Neck: No JVD, no TMG, no carotid bruits  Lungs: No use of accessory muscles, no dullness to  percussion, faint  exp  wheeze  Cardiovascular: RRR, heart sounds normal, no murmur or gallops, no peripheral edema  Abdomen: soft and NT, no HSM,  BS  normal  Musculoskeletal: No deformities, no cyanosis or clubbing  Neuro: alert, non focal  Skin: Warm, no lesions or rashes        Assessment & Plan:   Asthma with allergic rhinitis Flare   Depomedrol 120mg  Injection will be given-per pt request  Zpack take as directed.  Mucinex DM Twice daily  As needed  Cough/congestion  Fluids and rest  Saline nasal rinses As needed   Please contact office for sooner follow up if symptoms do not improve or worsen or seek emergency care  follow up Dr. Delford Field  In 1 month and As needed       Updated Medication List Outpatient Encounter Prescriptions as of 08/21/2011  Medication Sig Dispense Refill  . albuterol (PROAIR HFA) 108 (90 BASE) MCG/ACT inhaler Inhale 2 puffs into the lungs every 6 (six) hours as needed.  1 Inhaler  6  . ALPRAZolam (XANAX) 0.5 MG tablet Take 1/2 tab twice daily as needed      . aspirin 81 MG tablet Take 81 mg by mouth daily.        . Azelastine HCl 0.15 % SOLN Place 2 sprays into the nose daily.       . benzonatate (TESSALON) 100 MG capsule Take 1 capsule (100 mg total) by mouth 3 (three) times daily as needed.  90 capsule  6  . Calcium Carbonate-Vitamin D (CALTRATE 600+D) 600-400 MG-UNIT per tablet Take 1 tablet by mouth 2 (two) times daily.        . Cholecalciferol (VITAMIN D3) 1000 UNITS tablet Take 1,000 Units by mouth daily.        . CRESTOR 5 MG tablet TAKE 1 TABLET DAILY  90 tablet  2  . fexofenadine (ALLEGRA) 180 MG tablet Take 180 mg by mouth daily.        . hydrochlorothiazide (HYDRODIURIL) 25 MG tablet TAKE 1 TABLET DAILY  90 tablet  2  . ibandronate (BONIVA) 150 MG tablet Take 150 mg by mouth every 30 (thirty) days. Take in the morning with a full glass of water, on an empty stomach, and do not take anything else by mouth or lie down for the next 30 min.       . mometasone (NASONEX) 50 MCG/ACT nasal spray 2 sprays each nostril twice daily  51 g  6  . NEXIUM 40 MG capsule TAKE 1 CAPSULE TWICE A DAY  180  capsule  3  . omalizumab (XOLAIR) 150 MG injection Inject 150 mg into the skin every 14 (fourteen) days.        . potassium chloride (K-DUR,KLOR-CON) 10 MEQ tablet Two tablets once daily       . pramipexole (MIRAPEX) 0.125 MG tablet 3 tablets at bedtime       . SINGULAIR 10 MG tablet TAKE 1 TABLET AT BEDTIME  90 tablet  3  . Spacer/Aero-Holding Chambers (AEROCHAMBER MV) inhaler by Other route. Use as instructed       . SYMBICORT 160-4.5 MCG/ACT inhaler INHALE 2 PUFFS TWICE A DAY  1 Inhaler  1  . telmisartan (MICARDIS) 40 MG tablet Take 1 tablet (40 mg total) by mouth daily.  90 tablet  3  . vitamin B-12 (CYANOCOBALAMIN) 500 MCG tablet Take 500 mcg by mouth daily.        [name] azithromycin (ZITHROMAX Z-PAK) 250  MG tablet Take 2 tablets (500 mg) on  Day 1,  followed by 1 tablet (250 mg) once daily on Days 2 through 5.  6 each  0  . Hydrocod Polst-Chlorphen Polst (TUSSICAPS) 10-8 MG CP12 1 capsule by mouth twice daily as needed  30 each  0   Facility-Administered Encounter Medications as of 08/21/2011  Medication Dose Route Frequency Provider Last Rate Last Dose  . methylPREDNISolone acetate (DEPO-MEDROL) injection 120 mg  120 mg Intramuscular Once Julio Sicks, NP   120 mg at 08/21/11 1723

## 2011-08-22 NOTE — Assessment & Plan Note (Signed)
Flare   Depomedrol 120mg  Injection will be given-per pt request  Zpack take as directed.  Mucinex DM Twice daily  As needed  Cough/congestion  Fluids and rest  Saline nasal rinses As needed   Please contact office for sooner follow up if symptoms do not improve or worsen or seek emergency care  follow up Dr. Delford Field  In 1 month and As needed

## 2011-08-28 ENCOUNTER — Ambulatory Visit (INDEPENDENT_AMBULATORY_CARE_PROVIDER_SITE_OTHER): Payer: Medicare Other | Admitting: *Deleted

## 2011-08-28 DIAGNOSIS — I82409 Acute embolism and thrombosis of unspecified deep veins of unspecified lower extremity: Secondary | ICD-10-CM

## 2011-08-28 DIAGNOSIS — R52 Pain, unspecified: Secondary | ICD-10-CM

## 2011-08-28 DIAGNOSIS — R609 Edema, unspecified: Secondary | ICD-10-CM

## 2011-08-29 ENCOUNTER — Other Ambulatory Visit: Payer: Self-pay | Admitting: Critical Care Medicine

## 2011-09-01 DIAGNOSIS — E876 Hypokalemia: Secondary | ICD-10-CM | POA: Diagnosis not present

## 2011-09-02 NOTE — Procedures (Unsigned)
DUPLEX DEEP VENOUS EXAM - LOWER EXTREMITY  INDICATION:  Pain and edema  HISTORY:  Edema:  Bilateral lower extremity Trauma/Surgery:  No Pain:  Right leg for at least last 2 weeks PE:  No Previous DVT:  Right leg approximately 2006 Anticoagulants:  None Other:  Lasix started 08/25/2011  DUPLEX EXAM:               CFV   SFV   PopV  PTV    GSV               R  L  R  L  R  L  R   L  R  L Thrombosis    o  o  o  o  o  o  o   o  o  o Spontaneous   +  +  +  +  +  +  +   +  +  + Phasic        +  +  +  +  +  +  +   +  +  + Augmentation  +  +  +  +  +  +  +   +  +  + Compressible  +  +  +  +  +  +  +   +  +  + Competent     o  +  o  +  o  +  +   +  +  +  Legend:  + - yes  o - no  p - partial  D - decreased  IMPRESSION: 1. No evidence of deep venous thrombosis in the right or left lower     extremity.  There is a possible Baker's cyst noted in the right     popliteal fossa measuring 1.16 x 3.14 cm. 2. Results were called to Dr. Laurey Morale office.   _____________________________ Quita Skye. Hart Rochester, M.D.  EM/MEDQ  D:  08/29/2011  T:  08/29/2011  Job:  [id number]

## 2011-09-04 ENCOUNTER — Ambulatory Visit (INDEPENDENT_AMBULATORY_CARE_PROVIDER_SITE_OTHER): Payer: Medicare Other

## 2011-09-04 DIAGNOSIS — J45909 Unspecified asthma, uncomplicated: Secondary | ICD-10-CM

## 2011-09-05 MED ORDER — OMALIZUMAB 150 MG ~~LOC~~ SOLR
300.0000 mg | Freq: Once | SUBCUTANEOUS | Status: AC
  Administered 2011-09-05: 300 mg via SUBCUTANEOUS

## 2011-09-12 ENCOUNTER — Encounter: Payer: Self-pay | Admitting: Internal Medicine

## 2011-09-18 ENCOUNTER — Ambulatory Visit (INDEPENDENT_AMBULATORY_CARE_PROVIDER_SITE_OTHER): Payer: Medicare Other

## 2011-09-18 DIAGNOSIS — J45909 Unspecified asthma, uncomplicated: Secondary | ICD-10-CM

## 2011-09-18 MED ORDER — OMALIZUMAB 150 MG ~~LOC~~ SOLR
300.0000 mg | Freq: Once | SUBCUTANEOUS | Status: AC
  Administered 2011-09-18: 300 mg via SUBCUTANEOUS

## 2011-10-02 ENCOUNTER — Ambulatory Visit (INDEPENDENT_AMBULATORY_CARE_PROVIDER_SITE_OTHER): Payer: Medicare Other

## 2011-10-02 DIAGNOSIS — J45909 Unspecified asthma, uncomplicated: Secondary | ICD-10-CM

## 2011-10-02 MED ORDER — OMALIZUMAB 150 MG ~~LOC~~ SOLR
300.0000 mg | Freq: Once | SUBCUTANEOUS | Status: AC
  Administered 2011-10-02: 300 mg via SUBCUTANEOUS

## 2011-10-04 ENCOUNTER — Other Ambulatory Visit: Payer: Self-pay | Admitting: Critical Care Medicine

## 2011-10-10 ENCOUNTER — Ambulatory Visit (INDEPENDENT_AMBULATORY_CARE_PROVIDER_SITE_OTHER): Payer: Medicare Other | Admitting: Critical Care Medicine

## 2011-10-10 ENCOUNTER — Encounter: Payer: Self-pay | Admitting: Critical Care Medicine

## 2011-10-10 VITALS — BP 134/66 | HR 86 | Temp 97.4°F | Ht 65.0 in | Wt 171.6 lb

## 2011-10-10 DIAGNOSIS — J45909 Unspecified asthma, uncomplicated: Secondary | ICD-10-CM

## 2011-10-10 MED ORDER — AZELASTINE-FLUTICASONE 137-50 MCG/ACT NA SUSP: 2.0000 | Freq: Every day | NASAL | Status: DC

## 2011-10-10 MED ORDER — MOMETASONE FUROATE 220 MCG/INH IN AEPB: 2.0000 | INHALATION_SPRAY | Freq: Every day | RESPIRATORY_TRACT | Status: DC

## 2011-10-10 NOTE — Assessment & Plan Note (Signed)
Severe persistent asthma with associated severe atopic rhinitis and postnasal drip syndrome. Note pulmonary functions on today's visit are normal therefore Symbicort not indicated but instead will attempt to use inhaled steroid alone Plan Stop symbicort (can use up current supply first) Start Asmanex two puff daily once off symbicort Stay off nasonex Trial Dymista two puff each nostril daily, use sample Stop Astelin  Return  6 weeks

## 2011-10-10 NOTE — Patient Instructions (Addendum)
Stop symbicort (can use up current supply first) Start Asmanex two puff daily once off symbicort Stay off nasonex Trial Dymista two puff each nostril daily, use sample Stop Astelin  Return  6 weeks

## 2011-10-10 NOTE — Progress Notes (Signed)
Subjective:    Patient ID: [name], female    DOB: [birth year], 78 y.o.   MRN: [id number]  HPI 78 y.o. Moderate persistent asthma with severe atopy  10/10/2011 Pt notes some cough and paroxysms.  Stopped tussicaps d/t ?helping.  Notes some hoarseness. Throat tends to close off.  Stopped nasonex at night ?if made worse.   Stopped symbicort at night. Pt denies any significant sore throat, nasal congestion or excess secretions, fever, chills, sweats, unintended weight loss, pleurtic or exertional chest pain, orthopnea PND, or leg swelling Pt denies any increase in rescue therapy over baseline, denies waking up needing it or having any early am or nocturnal exacerbations of coughing/wheezing/or dyspnea. Pt also denies any obvious fluctuation in symptoms with  weather or environmental change or other alleviating or aggravating factors    Past Medical History  Diagnosis Date  . Restless legs syndrome (RLS)   . Osteoporosis, unspecified     Knee and hip osteoarthritis bilaterally  . Unspecified essential hypertension   . Other diseases of vocal cords   . Unspecified asthma   . Esophageal reflux   . Allergic rhinitis, cause unspecified   . Obstructive chronic bronchitis without exacerbation   . Hyperlipidemia   . Gallbladder disease   . Degenerative disk disease   . Perennial allergic rhinitis   . Diverticulosis   . Lymphedema     in the right arm  . Osteopenia   . Shingles 2007  . Chronic kidney disease, stage II (mild)   . Cancer      Family History  Problem Relation Age of Onset  . Heart attack Mother   . Heart attack Father   . Cancer Brother   . Asthma Brother   . Coronary artery disease Sister   . Asthma Mother   . Heart failure Father      History   Social History  . Marital Status: Married    Spouse Name: N/A    Number of Children: N/A  . Years of Education: N/A   Occupational History  . Not on file.   Social History Main Topics  . Smoking status:  Never Smoker   . Smokeless tobacco: Never Used  . Alcohol Use: No  . Drug Use: No  . Sexually Active: Not on file   Other Topics Concern  . Not on file   Social History Narrative  . No narrative on file     Allergies  Allergen Reactions  . Cephalosporins     REACTION: rash  . Codeine     REACTION: rash  . Eggs Or Egg-Derived Products   . Levofloxacin     REACTION: rash  . Penicillins     REACTION: rash     Outpatient Prescriptions Prior to Visit  Medication Sig Dispense Refill  . albuterol (PROAIR HFA) 108 (90 BASE) MCG/ACT inhaler Inhale 2 puffs into the lungs every 6 (six) hours as needed.  1 Inhaler  6  . ALPRAZolam (XANAX) 0.5 MG tablet Take 1/2 tab twice daily as needed      . aspirin 81 MG tablet Take 81 mg by mouth daily.        . benzonatate (TESSALON) 100 MG capsule Take 1 capsule (100 mg total) by mouth 3 (three) times daily as needed.  90 capsule  6  . Calcium Carbonate-Vitamin D (CALTRATE 600+D) 600-400 MG-UNIT per tablet Take 1 tablet by mouth 2 (two) times daily.        . Cholecalciferol (VITAMIN D3)  1000 UNITS tablet Take 1,000 Units by mouth daily.        . CRESTOR 5 MG tablet TAKE 1 TABLET DAILY  90 tablet  2  . fexofenadine (ALLEGRA) 180 MG tablet Take 180 mg by mouth daily.        [name] ibandronate (BONIVA) 150 MG tablet Take 150 mg by mouth every 30 (thirty) days. Take in the morning with a full glass of water, on an empty stomach, and do not take anything else by mouth or lie down for the next 30 min.       . montelukast (SINGULAIR) 10 MG tablet TAKE 1 TABLET AT BEDTIME  90 tablet  2  . NEXIUM 40 MG capsule TAKE 1 CAPSULE TWICE A DAY  180 capsule  3  . omalizumab (XOLAIR) 150 MG injection Inject 150 mg into the skin every 14 (fourteen) days.       . potassium chloride (K-DUR,KLOR-CON) 10 MEQ tablet Two tablets once daily       . pramipexole (MIRAPEX) 0.125 MG tablet 3 tablets at bedtime       . Spacer/Aero-Holding Chambers (AEROCHAMBER MV) inhaler by Other  route. Use as instructed       . telmisartan (MICARDIS) 40 MG tablet Take 1 tablet (40 mg total) by mouth daily.  90 tablet  3  . vitamin B-12 (CYANOCOBALAMIN) 500 MCG tablet Take 500 mcg by mouth daily.        . ASTEPRO 0.15 % SOLN USE 2 SPRAYS NASALLY DAILY  3 mL  1  . mometasone (NASONEX) 50 MCG/ACT nasal spray 2 sprays each nostril twice daily  51 g  6  . SYMBICORT 160-4.5 MCG/ACT inhaler INHALE 2 PUFFS TWICE A DAY  1 Inhaler  1  . hydrochlorothiazide (HYDRODIURIL) 25 MG tablet TAKE 1 TABLET DAILY  90 tablet  2  . Hydrocod Polst-Chlorphen Polst (TUSSICAPS) 10-8 MG CP12 1 capsule by mouth twice daily as needed  30 each  0      Review of Systems  .Constitutional:   No  weight loss, night sweats,  Fevers, chills,  + fatigue, or  lassitude.  HEENT:   No headaches,  Difficulty swallowing,  Tooth/dental problems, or  Sore throat,                No sneezing, itching, ear ache,  +nasal congestion, post nasal drip,   CV:  No chest pain,  Orthopnea, PND, swelling in lower extremities, anasarca, dizziness, palpitations, syncope.   GI  No heartburn, indigestion, abdominal pain, nausea, vomiting, diarrhea, change in bowel habits, loss of appetite, bloody stools.   Resp:    No coughing up of blood.    No chest wall deformity  Skin: no rash or lesions.  GU: no dysuria, change in color of urine, no urgency or frequency.  No flank pain, no hematuria   MS:  No joint pain or swelling.  No decreased range of motion.  No back pain.  Psych:  No change in mood or affect. No depression or anxiety.  No memory loss.         Objective:   Physical Exam BP 134/66  Pulse 86  Temp(Src) 97.4 F (36.3 C) (Oral)  Ht 5\' 5"  (1.651 m)  Wt 171 lb 9.6 oz (77.837 kg)  BMI 28.56 kg/m2  SpO2 99%  Gen: Pleasant, well-nourished, in no distress,  normal affect  ENT: No lesions,  mouth clear,  oropharynx clear, + postnasal drip  Neck: No JVD, no  TMG, no carotid bruits  Lungs: No use of accessory  muscles, no dullness to percussion, faint  exp  wheeze  Cardiovascular: RRR, heart sounds normal, no murmur or gallops, no peripheral edema  Abdomen: soft and NT, no HSM,  BS normal  Musculoskeletal: No deformities, no cyanosis or clubbing  Neuro: alert, non focal  Skin: Warm, no lesions or rashes        Assessment & Plan:   Asthma with allergic rhinitis Severe persistent asthma with associated severe atopic rhinitis and postnasal drip syndrome. Note pulmonary functions on today's visit are normal therefore Symbicort not indicated but instead will attempt to use inhaled steroid alone Plan Stop symbicort (can use up current supply first) Start Asmanex two puff daily once off symbicort Stay off nasonex Trial Dymista two puff each nostril daily, use sample Stop Astelin  Return  6 weeks      Updated Medication List Outpatient Encounter Prescriptions as of 10/10/2011  Medication Sig Dispense Refill  . albuterol (PROAIR HFA) 108 (90 BASE) MCG/ACT inhaler Inhale 2 puffs into the lungs every 6 (six) hours as needed.  1 Inhaler  6  . ALPRAZolam (XANAX) 0.5 MG tablet Take 1/2 tab twice daily as needed      . aspirin 81 MG tablet Take 81 mg by mouth daily.        . benzonatate (TESSALON) 100 MG capsule Take 1 capsule (100 mg total) by mouth 3 (three) times daily as needed.  90 capsule  6  . Calcium Carbonate-Vitamin D (CALTRATE 600+D) 600-400 MG-UNIT per tablet Take 1 tablet by mouth 2 (two) times daily.        . Cholecalciferol (VITAMIN D3) 1000 UNITS tablet Take 1,000 Units by mouth daily.        . CRESTOR 5 MG tablet TAKE 1 TABLET DAILY  90 tablet  2  . fexofenadine (ALLEGRA) 180 MG tablet Take 180 mg by mouth daily.        . furosemide (LASIX) 40 MG tablet Take 1 tablet by mouth daily.      [name] ibandronate (BONIVA) 150 MG tablet Take 150 mg by mouth every 30 (thirty) days. Take in the morning with a full glass of water, on an empty stomach, and do not take anything else by mouth or  lie down for the next 30 min.       . montelukast (SINGULAIR) 10 MG tablet TAKE 1 TABLET AT BEDTIME  90 tablet  2  . NEXIUM 40 MG capsule TAKE 1 CAPSULE TWICE A DAY  180 capsule  3  . omalizumab (XOLAIR) 150 MG injection Inject 150 mg into the skin every 14 (fourteen) days.       . potassium chloride (K-DUR,KLOR-CON) 10 MEQ tablet Two tablets once daily       . pramipexole (MIRAPEX) 0.125 MG tablet 3 tablets at bedtime       . Spacer/Aero-Holding Chambers (AEROCHAMBER MV) inhaler by Other route. Use as instructed       . telmisartan (MICARDIS) 40 MG tablet Take 1 tablet (40 mg total) by mouth daily.  90 tablet  3  . vitamin B-12 (CYANOCOBALAMIN) 500 MCG tablet Take 500 mcg by mouth daily.        [name] DISCONTD: ASTEPRO 0.15 % SOLN USE 2 SPRAYS NASALLY DAILY  3 mL  1  . DISCONTD: budesonide-formoterol (SYMBICORT) 160-4.5 MCG/ACT inhaler Inhale 2 puffs into the lungs daily.       [name] DISCONTD: mometasone (NASONEX) 50 MCG/ACT nasal spray  2 sprays each nostril twice daily  51 g  6  . DISCONTD: SYMBICORT 160-4.5 MCG/ACT inhaler INHALE 2 PUFFS TWICE A DAY  1 Inhaler  1  . Azelastine-Fluticasone (DYMISTA) 137-50 MCG/ACT SUSP Place 2 puffs into the nose daily.  1 Bottle  1  . mometasone (ASMANEX 120 METERED DOSES) 220 MCG/INH inhaler Inhale 2 puffs into the lungs daily.  1 Inhaler  12  . DISCONTD: hydrochlorothiazide (HYDRODIURIL) 25 MG tablet TAKE 1 TABLET DAILY  90 tablet  2  . DISCONTD: Hydrocod Polst-Chlorphen Polst (TUSSICAPS) 10-8 MG CP12 1 capsule by mouth twice daily as needed  30 each  0  . DISCONTD: mometasone (NASONEX) 50 MCG/ACT nasal spray 2 sprays each nostril once daily

## 2011-10-16 ENCOUNTER — Ambulatory Visit (INDEPENDENT_AMBULATORY_CARE_PROVIDER_SITE_OTHER): Payer: Medicare Other

## 2011-10-16 DIAGNOSIS — J45909 Unspecified asthma, uncomplicated: Secondary | ICD-10-CM | POA: Diagnosis not present

## 2011-10-16 MED ORDER — OMALIZUMAB 150 MG ~~LOC~~ SOLR
300.0000 mg | Freq: Once | SUBCUTANEOUS | Status: AC
  Administered 2011-10-16: 300 mg via SUBCUTANEOUS

## 2011-10-30 ENCOUNTER — Ambulatory Visit (INDEPENDENT_AMBULATORY_CARE_PROVIDER_SITE_OTHER): Payer: Medicare Other

## 2011-10-30 ENCOUNTER — Telehealth: Payer: Self-pay | Admitting: Critical Care Medicine

## 2011-10-30 DIAGNOSIS — J45909 Unspecified asthma, uncomplicated: Secondary | ICD-10-CM | POA: Diagnosis not present

## 2011-10-30 MED ORDER — OMALIZUMAB 150 MG ~~LOC~~ SOLR
300.0000 mg | Freq: Once | SUBCUTANEOUS | Status: AC
  Administered 2011-10-30: 300 mg via SUBCUTANEOUS

## 2011-10-30 MED ORDER — ESOMEPRAZOLE MAGNESIUM 40 MG PO CPDR: DELAYED_RELEASE_CAPSULE | ORAL | Status: DC

## 2011-10-30 NOTE — Telephone Encounter (Signed)
Notified pt Rx has been sent.  Pt stated nothing further needed at this time.  [name]

## 2011-10-30 NOTE — Telephone Encounter (Signed)
RX has been sent--lmomtcb x1 for pt to make aware

## 2011-11-03 ENCOUNTER — Other Ambulatory Visit: Payer: Self-pay | Admitting: *Deleted

## 2011-11-03 DIAGNOSIS — I119 Hypertensive heart disease without heart failure: Secondary | ICD-10-CM

## 2011-11-04 DIAGNOSIS — I10 Essential (primary) hypertension: Secondary | ICD-10-CM | POA: Diagnosis not present

## 2011-11-04 DIAGNOSIS — S8010XA Contusion of unspecified lower leg, initial encounter: Secondary | ICD-10-CM | POA: Diagnosis not present

## 2011-11-04 DIAGNOSIS — M7989 Other specified soft tissue disorders: Secondary | ICD-10-CM | POA: Diagnosis not present

## 2011-11-04 DIAGNOSIS — M79609 Pain in unspecified limb: Secondary | ICD-10-CM | POA: Diagnosis not present

## 2011-11-04 DIAGNOSIS — R609 Edema, unspecified: Secondary | ICD-10-CM | POA: Diagnosis not present

## 2011-11-04 DIAGNOSIS — S8990XA Unspecified injury of unspecified lower leg, initial encounter: Secondary | ICD-10-CM | POA: Diagnosis not present

## 2011-11-10 ENCOUNTER — Other Ambulatory Visit (INDEPENDENT_AMBULATORY_CARE_PROVIDER_SITE_OTHER): Payer: Medicare Other

## 2011-11-10 DIAGNOSIS — I119 Hypertensive heart disease without heart failure: Secondary | ICD-10-CM

## 2011-11-10 LAB — BASIC METABOLIC PANEL
BUN: 26 mg/dL — ABNORMAL HIGH (ref 6–23)
GFR: 41.86 mL/min — ABNORMAL LOW (ref >60.00)
Potassium: 3.5 mEq/L (ref 3.5–5.1)
Sodium: 141 mEq/L (ref 135–145)

## 2011-11-10 LAB — HEPATIC FUNCTION PANEL
ALT: 17 U/L (ref 0–35)
Albumin: 3.2 g/dL — ABNORMAL LOW (ref 3.5–5.2)
Bilirubin, Direct: 0.1 mg/dL (ref 0.0–0.3)
Total Protein: 6.3 g/dL (ref 6.0–8.3)

## 2011-11-10 LAB — LIPID PANEL
Cholesterol: 147 mg/dL (ref 0–200)
HDL: 80.3 mg/dL (ref >39.00)
Triglycerides: 89 mg/dL (ref 0.0–149.0)
VLDL: 17.8 mg/dL (ref 0.0–40.0)

## 2011-11-11 DIAGNOSIS — Z961 Presence of intraocular lens: Secondary | ICD-10-CM | POA: Diagnosis not present

## 2011-11-11 DIAGNOSIS — H40019 Open angle with borderline findings, low risk, unspecified eye: Secondary | ICD-10-CM | POA: Diagnosis not present

## 2011-11-11 DIAGNOSIS — E119 Type 2 diabetes mellitus without complications: Secondary | ICD-10-CM | POA: Diagnosis not present

## 2011-11-11 DIAGNOSIS — H2510 Age-related nuclear cataract, unspecified eye: Secondary | ICD-10-CM | POA: Diagnosis not present

## 2011-11-14 ENCOUNTER — Ambulatory Visit (INDEPENDENT_AMBULATORY_CARE_PROVIDER_SITE_OTHER): Payer: Medicare Other

## 2011-11-14 DIAGNOSIS — J45909 Unspecified asthma, uncomplicated: Secondary | ICD-10-CM | POA: Diagnosis not present

## 2011-11-14 MED ORDER — OMALIZUMAB 150 MG ~~LOC~~ SOLR
300.0000 mg | Freq: Once | SUBCUTANEOUS | Status: AC
  Administered 2011-11-14: 300 mg via SUBCUTANEOUS

## 2011-11-17 DIAGNOSIS — M5030 Other cervical disc degeneration, unspecified cervical region: Secondary | ICD-10-CM | POA: Diagnosis not present

## 2011-11-19 ENCOUNTER — Ambulatory Visit (INDEPENDENT_AMBULATORY_CARE_PROVIDER_SITE_OTHER): Payer: Medicare Other | Admitting: Cardiovascular Disease

## 2011-11-19 ENCOUNTER — Encounter: Payer: Self-pay | Admitting: Cardiovascular Disease

## 2011-11-19 VITALS — BP 132/76 | HR 87 | Ht 65.0 in | Wt 169.0 lb

## 2011-11-19 DIAGNOSIS — Z87898 Personal history of other specified conditions: Secondary | ICD-10-CM

## 2011-11-19 DIAGNOSIS — I10 Essential (primary) hypertension: Secondary | ICD-10-CM

## 2011-11-19 DIAGNOSIS — R0789 Other chest pain: Secondary | ICD-10-CM | POA: Diagnosis not present

## 2011-11-19 NOTE — Progress Notes (Signed)
[name] Date of Birth  [birth year]       [facility]    [place] [address]  [address] [place], [place]  [address]   [place], [place]  [address] [phone]     [phone]   Fax  [phone]    Fax [phone]  Problem List: 1. History of chest pain-normal coronary arteries by heart catheterization in August, 2008 2. History of breast cancer-status post right mastectomy and 1989-status post left mastectomy in 1990 3. Hypertension 4. Hyperlipidemia 5. Asthma  History of Present Illness:  [name] has had lots of problems with her asthma this summer.  She's not had any cardiac complaints. She denies any chest pain. She's not been able to exercise much of the summer because her asthma problems.  Current Outpatient Prescriptions on File Prior to Visit  Medication Sig Dispense Refill  . albuterol (PROAIR HFA) 108 (90 BASE) MCG/ACT inhaler Inhale 2 puffs into the lungs every 6 (six) hours as needed.  1 Inhaler  6  . ALPRAZolam (XANAX) 0.5 MG tablet Take 1/2 tab twice daily as needed      . aspirin 81 MG tablet Take 81 mg by mouth daily.        . Azelastine-Fluticasone (DYMISTA) 137-50 MCG/ACT SUSP Place 2 puffs into the nose daily.  1 Bottle  1  . benzonatate (TESSALON) 100 MG capsule Take 1 capsule (100 mg total) by mouth 3 (three) times daily as needed.  90 capsule  6  . Calcium Carbonate-Vitamin D (CALTRATE 600+D) 600-400 MG-UNIT per tablet Take 1 tablet by mouth 2 (two) times daily.        . Cholecalciferol (VITAMIN D3) 1000 UNITS tablet Take 1,000 Units by mouth daily.        . CRESTOR 5 MG tablet TAKE 1 TABLET DAILY  90 tablet  2  . esomeprazole (NEXIUM) 40 MG capsule Take 1 capsule twice a day  180 capsule  3  . fexofenadine (ALLEGRA) 180 MG tablet Take 180 mg by mouth daily.        . furosemide (LASIX) 40 MG tablet Take 1 tablet by mouth daily.      [name] ibandronate (BONIVA) 150 MG tablet Take 150 mg by mouth every 30  (thirty) days. Take in the morning with a full glass of water, on an empty stomach, and do not take anything else by mouth or lie down for the next 30 min.       . mometasone (ASMANEX 120 METERED DOSES) 220 MCG/INH inhaler Inhale 2 puffs into the lungs daily.  1 Inhaler  12  . montelukast (SINGULAIR) 10 MG tablet TAKE 1 TABLET AT BEDTIME  90 tablet  2  . omalizumab (XOLAIR) 150 MG injection Inject 150 mg into the skin every 14 (fourteen) days.       . potassium chloride (K-DUR,KLOR-CON) 10 MEQ tablet Two tablets once daily       . pramipexole (MIRAPEX) 0.125 MG tablet 3 tablets at bedtime       . Spacer/Aero-Holding Chambers (AEROCHAMBER MV) inhaler by Other route. Use as instructed       . telmisartan (MICARDIS) 40 MG tablet Take 1 tablet (40 mg total) by mouth daily.  90 tablet  3  . vitamin B-12 (CYANOCOBALAMIN) 500 MCG tablet Take 500 mcg by mouth daily.          Allergies  Allergen Reactions  . Cephalosporins     REACTION: rash  .  Codeine     REACTION: rash  . Eggs Or Egg-Derived Products   . Levofloxacin     REACTION: rash  . Penicillins     REACTION: rash    Past Medical History  Diagnosis Date  . Restless legs syndrome (RLS)   . Osteoporosis, unspecified     Knee and hip osteoarthritis bilaterally  . Unspecified essential hypertension   . Other diseases of vocal cords   . Unspecified asthma   . Esophageal reflux   . Allergic rhinitis, cause unspecified   . Obstructive chronic bronchitis without exacerbation   . Hyperlipidemia   . Gallbladder disease   . Degenerative disk disease   . Perennial allergic rhinitis   . Diverticulosis   . Lymphedema     in the right arm  . Osteopenia   . Shingles 2007  . Chronic kidney disease, stage II (mild)   . Cancer     Past Surgical History  Procedure Date  . Cardiac catheterization 08/012008    normal -- EF of 65%  . Mastectomy 1989    right  . Mastectomy 1990    left  . Back surgery 1983    lumbar procedure  . Back  surgery 1981    lumbar procedure  . Total abdominal hysterectomy w/ bilateral salpingoophorectomy   . Cholecystectomy 1983    History  Smoking status  . Never Smoker   Smokeless tobacco  . Never Used    History  Alcohol Use No    Family History  Problem Relation Age of Onset  . Heart attack Mother   . Heart attack Father   . Cancer Brother   . Asthma Brother   . Coronary artery disease Sister   . Asthma Mother   . Heart failure Father     Reviw of Systems:  Reviewed in the HPI.  All other systems are negative.  Physical Exam: Blood pressure 132/76, pulse 87, height 5\' 5"  (1.651 m), weight 169 lb (76.658 kg). General: Well developed, well nourished, in no acute distress.  Head: Normocephalic, atraumatic, sclera non-icteric, mucus membranes are moist,   Neck: Supple. Carotids are 2 + without bruits. No JVD  Lungs: Clear bilaterally to auscultation.  Heart: regular rate.  normal  S1 S2. No murmurs, gallops or rubs.  Abdomen: Soft, non-tender, non-distended with normal bowel sounds. No hepatomegaly. No rebound/guarding. No masses.  Msk:  Strength and tone are normal  Extremities: No clubbing or cyanosis. No edema.  Distal pedal pulses are 2+ and equal bilaterally.  Neuro: Alert and oriented X 3. Moves all extremities spontaneously.  Psych:  Responds to questions appropriately with a normal affect.  ECG: 11/19/2011 NSR at 87. NS ST/T abn.  No significant changes from previous tracings.  Assessment / Plan:

## 2011-11-19 NOTE — Patient Instructions (Addendum)
Your physician wants you to follow-up in: 1 year  You will receive a reminder letter in the mail two months in advance. If you don't receive a letter, please call our office to schedule the follow-up appointment.   Your physician recommends that you return for a FASTING lipid profile: 1 year   

## 2011-11-19 NOTE — Assessment & Plan Note (Signed)
[place] seems to be doing fairly well. She's had lots of problems with her asthma but has not had any cardiac lites. Her EKG remained stable. She does have nonspecific ST and T wave abnormalities.  Her lipids are quite acceptable. I'll see her again in one year. We'll check a fasting labs including lipid profile, basic metabolic profile, hepatic profile. We'll also check an EKG in one year.

## 2011-11-21 ENCOUNTER — Ambulatory Visit (INDEPENDENT_AMBULATORY_CARE_PROVIDER_SITE_OTHER): Payer: Medicare Other | Admitting: Critical Care Medicine

## 2011-11-21 ENCOUNTER — Other Ambulatory Visit: Payer: Self-pay | Admitting: Pulmonary Disease

## 2011-11-21 ENCOUNTER — Telehealth: Payer: Self-pay | Admitting: Critical Care Medicine

## 2011-11-21 ENCOUNTER — Encounter: Payer: Self-pay | Admitting: Critical Care Medicine

## 2011-11-21 VITALS — BP 126/60 | HR 97 | Temp 98.0°F | Ht 65.0 in | Wt 170.4 lb

## 2011-11-21 DIAGNOSIS — J45909 Unspecified asthma, uncomplicated: Secondary | ICD-10-CM | POA: Diagnosis not present

## 2011-11-21 DIAGNOSIS — R05 Cough: Secondary | ICD-10-CM | POA: Diagnosis not present

## 2011-11-21 DIAGNOSIS — R059 Cough, unspecified: Secondary | ICD-10-CM

## 2011-11-21 MED ORDER — TRAMADOL HCL 50 MG PO TABS: 50.0000 mg | ORAL_TABLET | Freq: Four times a day (QID) | ORAL | Status: AC | PRN

## 2011-11-21 MED ORDER — CHLORPHENIRAMINE TANNATE 12 MG PO TABS: ORAL_TABLET | ORAL | Status: DC

## 2011-11-21 MED ORDER — METHYLPREDNISOLONE ACETATE 80 MG/ML IJ SUSP
120.0000 mg | Freq: Once | INTRAMUSCULAR | Status: AC
  Administered 2011-11-21: 120 mg via INTRAMUSCULAR

## 2011-11-21 MED ORDER — BUDESONIDE-FORMOTEROL FUMARATE 160-4.5 MCG/ACT IN AERO: INHALATION_SPRAY | RESPIRATORY_TRACT | Status: DC

## 2011-11-21 MED ORDER — TRAMADOL HCL 50 MG PO TABS: 50.0000 mg | ORAL_TABLET | Freq: Four times a day (QID) | ORAL | Status: DC | PRN

## 2011-11-21 NOTE — Progress Notes (Signed)
Subjective:    Patient ID: [name], female    DOB: 1933-01-28, 78 y.o.   MRN: [id number]  HPI 78 y.o. Moderate persistent asthma with severe atopy   11/21/2011 Pt doing fairly well.   Pt denies any significant sore throat, nasal congestion or excess secretions, fever, chills, sweats, unintended weight loss, pleurtic or exertional chest pain, orthopnea PND, or leg swelling Pt denies any increase in rescue therapy over baseline, denies waking up needing it or having any early am or nocturnal exacerbations of coughing/wheezing/or dyspnea. Pt also denies any obvious fluctuation in symptoms with  weather or environmental change or other alleviating or aggravating factors    Past Medical History  Diagnosis Date  . Restless legs syndrome (RLS)   . Osteoporosis, unspecified     Knee and hip osteoarthritis bilaterally  . Unspecified essential hypertension   . Other diseases of vocal cords   . Unspecified asthma   . Esophageal reflux   . Allergic rhinitis, cause unspecified   . Obstructive chronic bronchitis without exacerbation   . Hyperlipidemia   . Gallbladder disease   . Degenerative disk disease   . Perennial allergic rhinitis   . Diverticulosis   . Lymphedema     in the right arm  . Osteopenia   . Shingles 2007  . Chronic kidney disease, stage II (mild)   . Cancer      Family History  Problem Relation Age of Onset  . Heart attack Mother   . Heart attack Father   . Cancer Brother   . Asthma Brother   . Coronary artery disease Sister   . Asthma Mother   . Heart failure Father      History   Social History  . Marital Status: Married    Spouse Name: N/A    Number of Children: N/A  . Years of Education: N/A   Occupational History  . Not on file.   Social History Main Topics  . Smoking status: Never Smoker   . Smokeless tobacco: Never Used  . Alcohol Use: No  . Drug Use: No  . Sexually Active: Not on file   Other Topics Concern  . Not on file    Social History Narrative  . No narrative on file     Allergies  Allergen Reactions  . Cephalosporins     REACTION: rash  . Codeine     REACTION: rash  . Eggs Or Egg-Derived Products   . Levofloxacin     REACTION: rash  . Penicillins     REACTION: rash     Outpatient Prescriptions Prior to Visit  Medication Sig Dispense Refill  . albuterol (PROAIR HFA) 108 (90 BASE) MCG/ACT inhaler Inhale 2 puffs into the lungs every 6 (six) hours as needed.  1 Inhaler  6  . ALPRAZolam (XANAX) 0.5 MG tablet Take 1/2 tab twice daily as needed      . aspirin 81 MG tablet Take 81 mg by mouth daily.        . benzonatate (TESSALON) 100 MG capsule Take 1 capsule (100 mg total) by mouth 3 (three) times daily as needed.  90 capsule  6  . Calcium Carbonate-Vitamin D (CALTRATE 600+D) 600-400 MG-UNIT per tablet Take 1 tablet by mouth 2 (two) times daily.        . Cholecalciferol (VITAMIN D3) 1000 UNITS tablet Take 1,000 Units by mouth daily.        . CRESTOR 5 MG tablet TAKE 1 TABLET DAILY  90  tablet  2  . esomeprazole (NEXIUM) 40 MG capsule Take 1 capsule twice a day  180 capsule  3  . fexofenadine (ALLEGRA) 180 MG tablet Take 180 mg by mouth daily.        . furosemide (LASIX) 40 MG tablet Take 1 tablet by mouth daily.      [name] ibandronate (BONIVA) 150 MG tablet Take 150 mg by mouth every 30 (thirty) days. Take in the morning with a full glass of water, on an empty stomach, and do not take anything else by mouth or lie down for the next 30 min.       . montelukast (SINGULAIR) 10 MG tablet TAKE 1 TABLET AT BEDTIME  90 tablet  2  . omalizumab (XOLAIR) 150 MG injection Inject 150 mg into the skin every 14 (fourteen) days.       . potassium chloride (K-DUR,KLOR-CON) 10 MEQ tablet Two tablets once daily       . pramipexole (MIRAPEX) 0.125 MG tablet 3 tablets at bedtime       . Spacer/Aero-Holding Chambers (AEROCHAMBER MV) inhaler by Other route. Use as instructed       . telmisartan (MICARDIS) 40 MG tablet Take  1 tablet (40 mg total) by mouth daily.  90 tablet  3  . vitamin B-12 (CYANOCOBALAMIN) 500 MCG tablet Take 500 mcg by mouth daily.        . Azelastine-Fluticasone (DYMISTA) 137-50 MCG/ACT SUSP Place 2 puffs into the nose daily.  1 Bottle  1  . mometasone (ASMANEX 120 METERED DOSES) 220 MCG/INH inhaler Inhale 2 puffs into the lungs daily.  1 Inhaler  12      Review of Systems  .Constitutional:   No  weight loss, night sweats,  Fevers, chills,  + fatigue, or  lassitude.  HEENT:   No headaches,  Difficulty swallowing,  Tooth/dental problems, or  Sore throat,                No sneezing, itching, ear ache,  +nasal congestion, post nasal drip,   CV:  No chest pain,  Orthopnea, PND, swelling in lower extremities, anasarca, dizziness, palpitations, syncope.   GI  No heartburn, indigestion, abdominal pain, nausea, vomiting, diarrhea, change in bowel habits, loss of appetite, bloody stools.   Resp:    No coughing up of blood.    No chest wall deformity  Skin: no rash or lesions.  GU: no dysuria, change in color of urine, no urgency or frequency.  No flank pain, no hematuria   MS:  No joint pain or swelling.  No decreased range of motion.  No back pain.  Psych:  No change in mood or affect. No depression or anxiety.  No memory loss.         Objective:   Physical Exam BP 126/60  Pulse 97  Temp 98 F (36.7 C) (Oral)  Ht 5\' 5"  (1.651 m)  Wt 170 lb 6.4 oz (77.293 kg)  BMI 28.36 kg/m2  SpO2 99%  Gen: Pleasant, well-nourished, in no distress,  normal affect  ENT: No lesions,  mouth clear,  oropharynx clear, + postnasal drip  Neck: No JVD, no TMG, no carotid bruits  Lungs: No use of accessory muscles, no dullness to percussion, distant BS  Cardiovascular: RRR, heart sounds normal, no murmur or gallops, no peripheral edema  Abdomen: soft and NT, no HSM,  BS normal  Musculoskeletal: No deformities, no cyanosis or clubbing  Neuro: alert, non focal  Skin: Warm, no lesions  or  rashes        Assessment & Plan:   Asthma with allergic rhinitis MIld asthma flare and pndrip syndrome and cyclical cough  Plan  depomedrol injection 120mg  was given Use chlorpheniramine 12mg  at bedtime Hold the symbicort for two weeks Cyclical cough protocol with tramadol and tessalon perles     Updated Medication List Outpatient Encounter Prescriptions as of 11/21/2011  Medication Sig Dispense Refill  . albuterol (PROAIR HFA) 108 (90 BASE) MCG/ACT inhaler Inhale 2 puffs into the lungs every 6 (six) hours as needed.  1 Inhaler  6  . ALPRAZolam (XANAX) 0.5 MG tablet Take 1/2 tab twice daily as needed      . aspirin 81 MG tablet Take 81 mg by mouth daily.        . benzonatate (TESSALON) 100 MG capsule Take 1 capsule (100 mg total) by mouth 3 (three) times daily as needed.  90 capsule  6  . budesonide-formoterol (SYMBICORT) 160-4.5 MCG/ACT inhaler HOLD for TWO WEEKS then resume  1 Inhaler    . Calcium Carbonate-Vitamin D (CALTRATE 600+D) 600-400 MG-UNIT per tablet Take 1 tablet by mouth 2 (two) times daily.        . Cholecalciferol (VITAMIN D3) 1000 UNITS tablet Take 1,000 Units by mouth daily.        . CRESTOR 5 MG tablet TAKE 1 TABLET DAILY  90 tablet  2  . esomeprazole (NEXIUM) 40 MG capsule Take 1 capsule twice a day  180 capsule  3  . fexofenadine (ALLEGRA) 180 MG tablet Take 180 mg by mouth daily.        . furosemide (LASIX) 40 MG tablet Take 1 tablet by mouth daily.      [name] ibandronate (BONIVA) 150 MG tablet Take 150 mg by mouth every 30 (thirty) days. Take in the morning with a full glass of water, on an empty stomach, and do not take anything else by mouth or lie down for the next 30 min.       . montelukast (SINGULAIR) 10 MG tablet TAKE 1 TABLET AT BEDTIME  90 tablet  2  . omalizumab (XOLAIR) 150 MG injection Inject 150 mg into the skin every 14 (fourteen) days.       . potassium chloride (K-DUR,KLOR-CON) 10 MEQ tablet Two tablets once daily       . pramipexole (MIRAPEX)  0.125 MG tablet 3 tablets at bedtime       . Spacer/Aero-Holding Chambers (AEROCHAMBER MV) inhaler by Other route. Use as instructed       . telmisartan (MICARDIS) 40 MG tablet Take 1 tablet (40 mg total) by mouth daily.  90 tablet  3  . vitamin B-12 (CYANOCOBALAMIN) 500 MCG tablet Take 500 mcg by mouth daily.        [name] DISCONTD: budesonide-formoterol (SYMBICORT) 160-4.5 MCG/ACT inhaler Inhale 2 puffs into the lungs 2 (two) times daily.      . Chlorpheniramine Tannate 12 MG TABS Use one at bedtime  30 each  6  . traMADol (ULTRAM) 50 MG tablet Take 1 tablet (50 mg total) by mouth every 6 (six) hours as needed (cough ).  40 tablet  4  . DISCONTD: Azelastine-Fluticasone (DYMISTA) 137-50 MCG/ACT SUSP Place 2 puffs into the nose daily.  1 Bottle  1  . DISCONTD: mometasone (ASMANEX 120 METERED DOSES) 220 MCG/INH inhaler Inhale 2 puffs into the lungs daily.  1 Inhaler  12  . DISCONTD: traMADol (ULTRAM) 50 MG tablet Take 1 tablet (50 mg  total) by mouth every 6 (six) hours as needed (cough ).  40 tablet  4  . methylPREDNISolone acetate (DEPO-MEDROL) injection 120 mg

## 2011-11-21 NOTE — Telephone Encounter (Signed)
Patient called, stated her Rx was not at her pharmacy. Contacted patient and stated that Rx was sent to [facility] by PW.  Patient stated she needed her Tramodol sent to her local pharmacy instead of the mail order. I contacted [facility] and notified to cancel Rx when they get it. Rx was sent in to her local pharmacy and patient notified. Also notified that Chlorpheniramine is otc.

## 2011-11-21 NOTE — Patient Instructions (Addendum)
A depomedrol injection 120mg  was given Use chlorpheniramine 12mg  at bedtime Hold the symbicort for two weeks  The key to effective treatment for your cough is eliminating the non-stop cycle of cough you're stuck in long enough to let your airway heal completely and then see if there is anything still making you cough once you stop the cough suppression, but this should take no more than 5 days to figure out   First take tessalon 200mg  every 4 hours with  tramadol 50  mg up to 2 every 4 hours to suppress the urge to cough.   Swallowing water or using ice chips/non mint and menthol containing candies (such as lifesavers or sugarless jolly ranchers) are also effective. You should rest your voice and avoid activities that you know make you cough.   Once you have eliminated the cough for 3 straight days try reducing the tramadol first, then the tessalon as tolerated.   GERD (REFLUX) is an extremely common cause of respiratory symptoms, many times with no significant heartburn at all. It can be treated with medication, but also with lifestyle changes including avoidance of late meals, excessive alcohol, smoking cessation, and avoid fatty foods, chocolate, peppermint, colas, red wine, and acidic juices such as orange juice.   NO MINT OR MENTHOL PRODUCTS SO NO COUGH DROPS USE SUGARLESS CANDY INSTEAD (jolley ranchers or Stover's) NO OIL BASED VITAMINS - use powdered substitutes.   Return 1 week with tammy parrett for recheck

## 2011-11-21 NOTE — Telephone Encounter (Signed)
EPIC shows rx was sent in 11/21/11 into [facility]. I called [facility] to see if they did recieve RX. I was advised they did receive RX and will get this ready for pt. I called pt to make her aware. Nothing further was needed

## 2011-11-23 NOTE — Assessment & Plan Note (Signed)
MIld asthma flare and pndrip syndrome and cyclical cough  Plan  depomedrol injection 120mg  was given Use chlorpheniramine 12mg  at bedtime Hold the symbicort for two weeks Cyclical cough protocol with tramadol and tessalon perles

## 2011-11-27 ENCOUNTER — Telehealth: Payer: Self-pay | Admitting: Critical Care Medicine

## 2011-11-27 NOTE — Telephone Encounter (Signed)
Pt advsied and she states she has already been taking the OTC version and told [facility] to [facility] the rx. Carron Curie, CMA

## 2011-11-27 NOTE — Telephone Encounter (Signed)
Any chlorpheniramine preparation ok - this is OTC medication (maleate commonly)

## 2011-11-27 NOTE — Telephone Encounter (Signed)
Dr. Vassie Loll, please advise substitute for for the chlorpheniramine tennate 12 mg. This med is no longer made and PW had prescribed this. Thanks!

## 2011-11-28 ENCOUNTER — Encounter: Payer: Self-pay | Admitting: Adult Health

## 2011-11-28 ENCOUNTER — Telehealth: Payer: Self-pay | Admitting: Critical Care Medicine

## 2011-11-28 ENCOUNTER — Ambulatory Visit (INDEPENDENT_AMBULATORY_CARE_PROVIDER_SITE_OTHER): Payer: Medicare Other | Admitting: Adult Health

## 2011-11-28 ENCOUNTER — Ambulatory Visit (INDEPENDENT_AMBULATORY_CARE_PROVIDER_SITE_OTHER): Payer: Medicare Other

## 2011-11-28 VITALS — BP 106/58 | HR 68 | Temp 97.3°F | Ht 65.0 in | Wt 166.0 lb

## 2011-11-28 DIAGNOSIS — J45909 Unspecified asthma, uncomplicated: Secondary | ICD-10-CM | POA: Diagnosis not present

## 2011-11-28 MED ORDER — CHLORPHENIRAMINE TANNATE 12 MG PO TABS: ORAL_TABLET | ORAL | Status: DC

## 2011-11-28 MED ORDER — OMALIZUMAB 150 MG ~~LOC~~ SOLR
300.0000 mg | Freq: Once | SUBCUTANEOUS | Status: AC
  Administered 2011-11-28: 300 mg via SUBCUTANEOUS

## 2011-11-28 NOTE — Patient Instructions (Addendum)
Continue on current regimen for cough control  May add Delsym 2 tsp Twice daily  For extra cough control.  Restart Symbicort in 1 week  follow up Dr. Delford Field  In 6 weeks and As needed   Please contact office for sooner follow up if symptoms do not improve or worsen or seek emergency care

## 2011-11-28 NOTE — Telephone Encounter (Signed)
Pt seen for ov with TP today.  Pt stated that [facility] called her regarding her chlor-tab 12mg  medication shipping.  Pt stated she asked them to hold this medication until she was seen so that she could verify if she needed to continue taking it.  Per TP, ok to take.    Called [facility], spoke with Britta Mccreedy who stated that pt's script was voided and is not covered by pt's insurance.    Called spoke with patient who requested that the chlorpheniramine 12mg  tabs be resent to [facility] and if it is too expensive out of pocket, she will discuss with PW at her next ov.  Script resent per pt's request as last ordered.

## 2011-11-28 NOTE — Assessment & Plan Note (Addendum)
Resolving flare complicated by VCD and cyclical cough  Improving on regimen with tx aimed at AR , cough prevention   Plan;  Continue on current regimen for cough control  May add Delsym 2 tsp Twice daily  For extra cough control.  Restart Symbicort in 1 week  follow up Dr. Delford Field  In 6 weeks and As needed   Please contact office for sooner follow up if symptoms do not improve or worsen or seek emergency care

## 2011-11-28 NOTE — Progress Notes (Signed)
Subjective:    Patient ID: [name], female    DOB: July 05, 1932, 78 y.o.   MRN: [id number]  HPI 78 y.o. Moderate persistent asthma with severe atopy  11/21/2011 Pt doing fairly well except for cough  >>cyclical cough regimen w/ tramadol Kimberlee Nearing , chlortrimeton and depo medrol  Symbicort was held.   11/28/2011 Follow up  Returns for 1 week follow up for cyclical cough.  Tx with tramadol and chlortrimeton .  Patient states better since last visit. Had to use otc chlortabs , has not gotten rx from mail pharm.  Feels better w/ less cough.     Past Medical History  Diagnosis Date  . Restless legs syndrome (RLS)   . Osteoporosis, unspecified     Knee and hip osteoarthritis bilaterally  . Unspecified essential hypertension   . Other diseases of vocal cords   . Unspecified asthma   . Esophageal reflux   . Allergic rhinitis, cause unspecified   . Obstructive chronic bronchitis without exacerbation   . Hyperlipidemia   . Gallbladder disease   . Degenerative disk disease   . Perennial allergic rhinitis   . Diverticulosis   . Lymphedema     in the right arm  . Osteopenia   . Shingles 2007  . Chronic kidney disease, stage II (mild)   . Cancer      Family History  Problem Relation Age of Onset  . Heart attack Mother   . Heart attack Father   . Cancer Brother   . Asthma Brother   . Coronary artery disease Sister   . Asthma Mother   . Heart failure Father      History   Social History  . Marital Status: Married    Spouse Name: N/A    Number of Children: N/A  . Years of Education: N/A   Occupational History  . Not on file.   Social History Main Topics  . Smoking status: Never Smoker   . Smokeless tobacco: Never Used  . Alcohol Use: No  . Drug Use: No  . Sexually Active: Not on file   Other Topics Concern  . Not on file   Social History Narrative  . No narrative on file     Allergies  Allergen Reactions  . Cephalosporins     REACTION: rash  .  Codeine     REACTION: rash  . Eggs Or Egg-Derived Products   . Levofloxacin     REACTION: rash  . Penicillins     REACTION: rash     Outpatient Prescriptions Prior to Visit  Medication Sig Dispense Refill  . albuterol (PROAIR HFA) 108 (90 BASE) MCG/ACT inhaler Inhale 2 puffs into the lungs every 6 (six) hours as needed.  1 Inhaler  6  . ALPRAZolam (XANAX) 0.5 MG tablet Take 1/2 tab twice daily as needed      . aspirin 81 MG tablet Take 81 mg by mouth daily.        . benzonatate (TESSALON) 100 MG capsule Take 1 capsule (100 mg total) by mouth 3 (three) times daily as needed.  90 capsule  6  . Calcium Carbonate-Vitamin D (CALTRATE 600+D) 600-400 MG-UNIT per tablet Take 1 tablet by mouth 2 (two) times daily.        . Chlorpheniramine Tannate 12 MG TABS Use one at bedtime  30 each  6  . Cholecalciferol (VITAMIN D3) 1000 UNITS tablet Take 1,000 Units by mouth daily.        Gasper Lloyd  5 MG tablet TAKE 1 TABLET DAILY  90 tablet  2  . esomeprazole (NEXIUM) 40 MG capsule Take 1 capsule twice a day  180 capsule  3  . furosemide (LASIX) 40 MG tablet Take 1 tablet by mouth daily.      . montelukast (SINGULAIR) 10 MG tablet TAKE 1 TABLET AT BEDTIME  90 tablet  2  . omalizumab (XOLAIR) 150 MG injection Inject 150 mg into the skin every 14 (fourteen) days.       . potassium chloride (K-DUR,KLOR-CON) 10 MEQ tablet Two tablets once daily       . pramipexole (MIRAPEX) 0.125 MG tablet 3 tablets at bedtime       . Spacer/Aero-Holding Chambers (AEROCHAMBER MV) inhaler by Other route. Use as instructed       . telmisartan (MICARDIS) 40 MG tablet Take 1 tablet (40 mg total) by mouth daily.  90 tablet  3  . traMADol (ULTRAM) 50 MG tablet Take 1 tablet (50 mg total) by mouth every 6 (six) hours as needed (cough ).  40 tablet  4  . vitamin B-12 (CYANOCOBALAMIN) 500 MCG tablet Take 500 mcg by mouth daily.        . budesonide-formoterol (SYMBICORT) 160-4.5 MCG/ACT inhaler HOLD for TWO WEEKS then resume  1 Inhaler      . fexofenadine (ALLEGRA) 180 MG tablet Take 180 mg by mouth daily.        [name] ibandronate (BONIVA) 150 MG tablet Take 150 mg by mouth every 30 (thirty) days. Take in the morning with a full glass of water, on an empty stomach, and do not take anything else by mouth or lie down for the next 30 min.           Review of Systems  .Constitutional:   No  weight loss, night sweats,  Fevers, chills,  + fatigue, or  lassitude.  HEENT:   No headaches,  Difficulty swallowing,  Tooth/dental problems, or  Sore throat,                No sneezing, itching, ear ache,  +nasal congestion, post nasal drip,   CV:  No chest pain,  Orthopnea, PND, swelling in lower extremities, anasarca, dizziness, palpitations, syncope.   GI  No heartburn, indigestion, abdominal pain, nausea, vomiting, diarrhea, change in bowel habits, loss of appetite, bloody stools.   Resp:    No coughing up of blood.    No chest wall deformity  Skin: no rash or lesions.  GU: no dysuria, change in color of urine, no urgency or frequency.  No flank pain, no hematuria   MS:  No joint pain or swelling.  No decreased range of motion.  No back pain.  Psych:  No change in mood or affect. No depression or anxiety.  No memory loss.         Objective:   Physical Exam BP 106/58  Pulse 68  Temp 97.3 F (36.3 C) (Oral)  Ht 5\' 5"  (1.651 m)  Wt 166 lb (75.297 kg)  BMI 27.62 kg/m2  SpO2 100%  Gen: Pleasant, well-nourished, in no distress,  normal affect  ENT: No lesions,  mouth clear,  oropharynx clear, + postnasal drip  Neck: No JVD, no TMG, no carotid bruits  Lungs: No use of accessory muscles, no dullness to percussion, distant BS  Cardiovascular: RRR, heart sounds normal, no murmur or gallops, no peripheral edema  Abdomen: soft and NT, no HSM,  BS normal  Musculoskeletal: No deformities, no  cyanosis or clubbing  Neuro: alert, non focal  Skin: Warm, no lesions or rashes        Assessment & Plan:   No  problem-specific assessment & plan notes found for this encounter.   Updated Medication List Outpatient Encounter Prescriptions as of 11/28/2011  Medication Sig Dispense Refill  . albuterol (PROAIR HFA) 108 (90 BASE) MCG/ACT inhaler Inhale 2 puffs into the lungs every 6 (six) hours as needed.  1 Inhaler  6  . ALPRAZolam (XANAX) 0.5 MG tablet Take 1/2 tab twice daily as needed      . aspirin 81 MG tablet Take 81 mg by mouth daily.        . benzonatate (TESSALON) 100 MG capsule Take 1 capsule (100 mg total) by mouth 3 (three) times daily as needed.  90 capsule  6  . Calcium Carbonate-Vitamin D (CALTRATE 600+D) 600-400 MG-UNIT per tablet Take 1 tablet by mouth 2 (two) times daily.        . Chlorpheniramine Tannate 12 MG TABS Use one at bedtime  30 each  6  . Cholecalciferol (VITAMIN D3) 1000 UNITS tablet Take 1,000 Units by mouth daily.        . CRESTOR 5 MG tablet TAKE 1 TABLET DAILY  90 tablet  2  . esomeprazole (NEXIUM) 40 MG capsule Take 1 capsule twice a day  180 capsule  3  . furosemide (LASIX) 40 MG tablet Take 1 tablet by mouth daily.      . montelukast (SINGULAIR) 10 MG tablet TAKE 1 TABLET AT BEDTIME  90 tablet  2  . omalizumab (XOLAIR) 150 MG injection Inject 150 mg into the skin every 14 (fourteen) days.       . potassium chloride (K-DUR,KLOR-CON) 10 MEQ tablet Two tablets once daily       . pramipexole (MIRAPEX) 0.125 MG tablet 3 tablets at bedtime       . Spacer/Aero-Holding Chambers (AEROCHAMBER MV) inhaler by Other route. Use as instructed       . telmisartan (MICARDIS) 40 MG tablet Take 1 tablet (40 mg total) by mouth daily.  90 tablet  3  . traMADol (ULTRAM) 50 MG tablet Take 1 tablet (50 mg total) by mouth every 6 (six) hours as needed (cough ).  40 tablet  4  . vitamin B-12 (CYANOCOBALAMIN) 500 MCG tablet Take 500 mcg by mouth daily.        . budesonide-formoterol (SYMBICORT) 160-4.5 MCG/ACT inhaler HOLD for TWO WEEKS then resume  1 Inhaler    . fexofenadine (ALLEGRA) 180 MG  tablet Take 180 mg by mouth daily.        [name] ibandronate (BONIVA) 150 MG tablet Take 150 mg by mouth every 30 (thirty) days. Take in the morning with a full glass of water, on an empty stomach, and do not take anything else by mouth or lie down for the next 30 min.

## 2011-11-29 ENCOUNTER — Other Ambulatory Visit: Payer: Self-pay | Admitting: Cardiovascular Disease

## 2011-12-01 DIAGNOSIS — J45909 Unspecified asthma, uncomplicated: Secondary | ICD-10-CM | POA: Diagnosis not present

## 2011-12-01 DIAGNOSIS — I10 Essential (primary) hypertension: Secondary | ICD-10-CM | POA: Diagnosis not present

## 2011-12-01 DIAGNOSIS — R7301 Impaired fasting glucose: Secondary | ICD-10-CM | POA: Diagnosis not present

## 2011-12-01 DIAGNOSIS — J309 Allergic rhinitis, unspecified: Secondary | ICD-10-CM | POA: Diagnosis not present

## 2011-12-03 ENCOUNTER — Telehealth: Payer: Self-pay | Admitting: Critical Care Medicine

## 2011-12-03 NOTE — Telephone Encounter (Signed)
The rx form of chlorphenimarine (ahist) has been discontinued from the manufacturer.  Pt has stated in the past that she cannot afford this otc.  Please advise on whether this pt is to stay on this medication otc or if another alternative is available.  (Triage:  No reason to [facility] just need to notify pt)

## 2011-12-03 NOTE — Telephone Encounter (Signed)
No substitute Will have to use 4mg  chlorpheniramine OTC

## 2011-12-03 NOTE — Telephone Encounter (Signed)
Spoke with pt and notified of recs per PW and she verbalized understanding and states nothing further needed.

## 2011-12-09 DIAGNOSIS — H2510 Age-related nuclear cataract, unspecified eye: Secondary | ICD-10-CM | POA: Diagnosis not present

## 2011-12-12 ENCOUNTER — Ambulatory Visit (INDEPENDENT_AMBULATORY_CARE_PROVIDER_SITE_OTHER): Payer: Medicare Other

## 2011-12-12 DIAGNOSIS — J45909 Unspecified asthma, uncomplicated: Secondary | ICD-10-CM

## 2011-12-15 DIAGNOSIS — H2510 Age-related nuclear cataract, unspecified eye: Secondary | ICD-10-CM | POA: Diagnosis not present

## 2011-12-15 MED ORDER — OMALIZUMAB 150 MG ~~LOC~~ SOLR
300.0000 mg | Freq: Once | SUBCUTANEOUS | Status: AC
  Administered 2011-12-15: 300 mg via SUBCUTANEOUS

## 2011-12-26 ENCOUNTER — Ambulatory Visit (INDEPENDENT_AMBULATORY_CARE_PROVIDER_SITE_OTHER): Payer: Medicare Other

## 2011-12-26 DIAGNOSIS — J45909 Unspecified asthma, uncomplicated: Secondary | ICD-10-CM | POA: Diagnosis not present

## 2011-12-26 MED ORDER — OMALIZUMAB 150 MG ~~LOC~~ SOLR
300.0000 mg | Freq: Once | SUBCUTANEOUS | Status: AC
  Administered 2011-12-26: 300 mg via SUBCUTANEOUS

## 2012-01-09 ENCOUNTER — Ambulatory Visit (INDEPENDENT_AMBULATORY_CARE_PROVIDER_SITE_OTHER): Payer: Medicare Other

## 2012-01-09 DIAGNOSIS — J45909 Unspecified asthma, uncomplicated: Secondary | ICD-10-CM | POA: Diagnosis not present

## 2012-01-09 MED ORDER — OMALIZUMAB 150 MG ~~LOC~~ SOLR
300.0000 mg | Freq: Once | SUBCUTANEOUS | Status: AC
  Administered 2012-01-09: 300 mg via SUBCUTANEOUS

## 2012-01-14 DIAGNOSIS — M1710 Unilateral primary osteoarthritis, unspecified knee: Secondary | ICD-10-CM | POA: Diagnosis not present

## 2012-01-21 ENCOUNTER — Ambulatory Visit (INDEPENDENT_AMBULATORY_CARE_PROVIDER_SITE_OTHER): Payer: Medicare Other | Admitting: Critical Care Medicine

## 2012-01-21 ENCOUNTER — Encounter: Payer: Self-pay | Admitting: Critical Care Medicine

## 2012-01-21 VITALS — BP 106/70 | HR 71 | Temp 97.6°F | Ht 65.0 in | Wt 165.4 lb

## 2012-01-21 DIAGNOSIS — J45909 Unspecified asthma, uncomplicated: Secondary | ICD-10-CM | POA: Diagnosis not present

## 2012-01-21 MED ORDER — TRAMADOL HCL 50 MG PO TABS: 50.0000 mg | ORAL_TABLET | Freq: Every evening | ORAL | Status: DC | PRN

## 2012-01-21 NOTE — Assessment & Plan Note (Signed)
Severe persistent asthma with allergic rhinitis component and associated cyclical cough Chronic postnasal drip syndrome and reflux disease also precipitating factor Plan Maintain inhaled steroid Maintain nasal steroids Maintain tramadol, Tessalon, chlorpheniramine

## 2012-01-21 NOTE — Progress Notes (Signed)
Subjective:    Patient ID: [name], female    DOB: [birth year], 78 y.o.   MRN: [id number]  HPI 78 y.o. Moderate persistent asthma with severe atopy  11/21/2011 Pt doing fairly well except for cough  >>cyclical cough regimen w/ tramadol Kimberlee Nearing , chlortrimeton and depo medrol  Symbicort was held.   7/19  Follow up  Returns for 1 week follow up for cyclical cough.  Tx with tramadol and chlortrimeton .  Patient states better since last visit. Had to use otc chlortabs , has not gotten rx from mail pharm.  Feels better w/ less cough.   01/21/2012 Seen in f/u.  Pt just back from vacation.  Now is coughing more, tramadol, tessalon and chlor trimeton and this helped.  Cough is dry.  Dyspnea is the same.  No real wheeze. Pt denies any significant sore throat, nasal congestion or excess secretions, fever, chills, sweats, unintended weight loss, pleurtic or exertional chest pain, orthopnea PND, or leg swelling Pt denies any increase in rescue therapy over baseline, denies waking up needing it or having any early am or nocturnal exacerbations of coughing/wheezing/or dyspnea. Pt also denies any obvious fluctuation in symptoms with  weather or environmental change or other alleviating or aggravating factors    Past Medical History  Diagnosis Date  . Restless legs syndrome (RLS)   . Osteoporosis, unspecified     Knee and hip osteoarthritis bilaterally  . Unspecified essential hypertension   . Other diseases of vocal cords   . Unspecified asthma   . Esophageal reflux   . Allergic rhinitis, cause unspecified   . Obstructive chronic bronchitis without exacerbation   . Hyperlipidemia   . Gallbladder disease   . Degenerative disk disease   . Perennial allergic rhinitis   . Diverticulosis   . Lymphedema     in the right arm  . Osteopenia   . Shingles 2007  . Chronic kidney disease, stage II (mild)   . Cancer      Family History  Problem Relation Age of Onset  . Heart attack  Mother   . Heart attack Father   . Cancer Brother   . Asthma Brother   . Coronary artery disease Sister   . Asthma Mother   . Heart failure Father      History   Social History  . Marital Status: Married    Spouse Name: N/A    Number of Children: N/A  . Years of Education: N/A   Occupational History  . Not on file.   Social History Main Topics  . Smoking status: Never Smoker   . Smokeless tobacco: Never Used  . Alcohol Use: No  . Drug Use: No  . Sexually Active: Not on file   Other Topics Concern  . Not on file   Social History Narrative  . No narrative on file     Allergies  Allergen Reactions  . Cephalosporins     REACTION: rash  . Codeine     REACTION: rash  . Eggs Or Egg-Derived Products   . Levofloxacin     REACTION: rash  . Penicillins     REACTION: rash     Outpatient Prescriptions Prior to Visit  Medication Sig Dispense Refill  . albuterol (PROAIR HFA) 108 (90 BASE) MCG/ACT inhaler Inhale 2 puffs into the lungs every 6 (six) hours as needed.  1 Inhaler  6  . ALPRAZolam (XANAX) 0.5 MG tablet Take 1/2 tab twice daily as needed      .  aspirin 81 MG tablet Take 81 mg by mouth daily.        . benzonatate (TESSALON) 100 MG capsule Take 1 capsule (100 mg total) by mouth 3 (three) times daily as needed.  90 capsule  6  . Calcium Carbonate-Vitamin D (CALTRATE 600+D) 600-400 MG-UNIT per tablet Take 1 tablet by mouth daily.       . Cholecalciferol (VITAMIN D3) 1000 UNITS tablet Take 1,000 Units by mouth daily.        . CRESTOR 5 MG tablet TAKE 1 TABLET DAILY  90 tablet  2  . esomeprazole (NEXIUM) 40 MG capsule Take 1 capsule twice a day  180 capsule  3  . fexofenadine (ALLEGRA) 180 MG tablet Take 180 mg by mouth daily.        . furosemide (LASIX) 40 MG tablet Take 1 tablet by mouth daily.      [name] ibandronate (BONIVA) 150 MG tablet Take 150 mg by mouth every 30 (thirty) days. Take in the morning with a full glass of water, on an empty stomach, and do not take  anything else by mouth or lie down for the next 30 min.       [name] MICARDIS 40 MG tablet TAKE 1 TABLET DAILY  90 tablet  2  . montelukast (SINGULAIR) 10 MG tablet TAKE 1 TABLET AT BEDTIME  90 tablet  2  . omalizumab (XOLAIR) 150 MG injection Inject 150 mg into the skin every 14 (fourteen) days.       . potassium chloride (K-DUR,KLOR-CON) 10 MEQ tablet Two tablets once daily       . pramipexole (MIRAPEX) 0.125 MG tablet 3 tablets at bedtime       . Spacer/Aero-Holding Chambers (AEROCHAMBER MV) inhaler by Other route. Use as instructed       . vitamin B-12 (CYANOCOBALAMIN) 500 MCG tablet Take 500 mcg by mouth daily.        . budesonide-formoterol (SYMBICORT) 160-4.5 MCG/ACT inhaler HOLD for TWO WEEKS then resume  1 Inhaler    . Chlorpheniramine Tannate 12 MG TABS Use one at bedtime  90 each  3      Review of Systems  .Constitutional:   No  weight loss, night sweats,  Fevers, chills,  + fatigue, or  lassitude.  HEENT:   No headaches,  Difficulty swallowing,  Tooth/dental problems, or  Sore throat,                No sneezing, itching, ear ache,  +nasal congestion, post nasal drip,   CV:  No chest pain,  Orthopnea, PND, swelling in lower extremities, anasarca, dizziness, palpitations, syncope.   GI  No heartburn, indigestion, abdominal pain, nausea, vomiting, diarrhea, change in bowel habits, loss of appetite, bloody stools.   Resp:    No coughing up of blood.    No chest wall deformity  Skin: no rash or lesions.  GU: no dysuria, change in color of urine, no urgency or frequency.  No flank pain, no hematuria   MS:  No joint pain or swelling.  No decreased range of motion.  No back pain.  Psych:  No change in mood or affect. No depression or anxiety.  No memory loss.         Objective:   Physical Exam BP 106/70  Pulse 71  Temp 97.6 F (36.4 C) (Oral)  Ht 5\' 5"  (1.651 m)  Wt 165 lb 6.4 oz (75.025 kg)  BMI 27.52 kg/m2  SpO2 96%  Gen: Pleasant,  well-nourished, in no distress,   normal affect  ENT: No lesions,  mouth clear,  oropharynx clear, + postnasal drip  Neck: No JVD, no TMG, no carotid bruits  Lungs: No use of accessory muscles, no dullness to percussion, distant BS  Cardiovascular: RRR, heart sounds normal, no murmur or gallops, no peripheral edema  Abdomen: soft and NT, no HSM,  BS normal  Musculoskeletal: No deformities, no cyanosis or clubbing  Neuro: alert, non focal  Skin: Warm, no lesions or rashes      The patient deferred flu vaccine due to severe egg allergy  Assessment & Plan:   Asthma with allergic rhinitis Severe persistent asthma with allergic rhinitis component and associated cyclical cough Chronic postnasal drip syndrome and reflux disease also precipitating factor Plan Maintain inhaled steroid Maintain nasal steroids Maintain tramadol, Tessalon, chlorpheniramine    Updated Medication List Outpatient Encounter Prescriptions as of 01/21/2012  Medication Sig Dispense Refill  . albuterol (PROAIR HFA) 108 (90 BASE) MCG/ACT inhaler Inhale 2 puffs into the lungs every 6 (six) hours as needed.  1 Inhaler  6  . ALPRAZolam (XANAX) 0.5 MG tablet Take 1/2 tab twice daily as needed      . aspirin 81 MG tablet Take 81 mg by mouth daily.        . benzonatate (TESSALON) 100 MG capsule Take 1 capsule (100 mg total) by mouth 3 (three) times daily as needed.  90 capsule  6  . budesonide-formoterol (SYMBICORT) 160-4.5 MCG/ACT inhaler Inhale 2 puffs into the lungs daily.      . Calcium Carbonate-Vitamin D (CALTRATE 600+D) 600-400 MG-UNIT per tablet Take 1 tablet by mouth daily.       . chlorpheniramine (CHLOR-TRIMETON) 4 MG tablet Take 4 mg by mouth as needed.      . Cholecalciferol (VITAMIN D3) 1000 UNITS tablet Take 1,000 Units by mouth daily.        . CRESTOR 5 MG tablet TAKE 1 TABLET DAILY  90 tablet  2  . esomeprazole (NEXIUM) 40 MG capsule Take 1 capsule twice a day  180 capsule  3  . fexofenadine (ALLEGRA) 180 MG tablet Take 180 mg by  mouth daily.        . furosemide (LASIX) 40 MG tablet Take 1 tablet by mouth daily.      [name] ibandronate (BONIVA) 150 MG tablet Take 150 mg by mouth every 30 (thirty) days. Take in the morning with a full glass of water, on an empty stomach, and do not take anything else by mouth or lie down for the next 30 min.       [name] MICARDIS 40 MG tablet TAKE 1 TABLET DAILY  90 tablet  2  . montelukast (SINGULAIR) 10 MG tablet TAKE 1 TABLET AT BEDTIME  90 tablet  2  . omalizumab (XOLAIR) 150 MG injection Inject 150 mg into the skin every 14 (fourteen) days.       . potassium chloride (K-DUR,KLOR-CON) 10 MEQ tablet Two tablets once daily       . pramipexole (MIRAPEX) 0.125 MG tablet 3 tablets at bedtime       . Spacer/Aero-Holding Chambers (AEROCHAMBER MV) inhaler by Other route. Use as instructed       . traMADol (ULTRAM) 50 MG tablet Take 1-2 tablets (50-100 mg total) by mouth at bedtime as needed for pain.  60 tablet  4  . vitamin B-12 (CYANOCOBALAMIN) 500 MCG tablet Take 500 mcg by mouth daily.        [name]  DISCONTD: budesonide-formoterol (SYMBICORT) 160-4.5 MCG/ACT inhaler HOLD for TWO WEEKS then resume  1 Inhaler    . DISCONTD: traMADol (ULTRAM) 50 MG tablet Take 1-2 tablets by mouth At bedtime.      [name] DISCONTD: Chlorpheniramine Tannate 12 MG TABS Use one at bedtime  90 each  3

## 2012-01-21 NOTE — Patient Instructions (Signed)
You deferred the flu vaccine No change in medications. Return in         2 months

## 2012-01-26 ENCOUNTER — Ambulatory Visit (INDEPENDENT_AMBULATORY_CARE_PROVIDER_SITE_OTHER): Payer: Medicare Other

## 2012-01-26 DIAGNOSIS — J45909 Unspecified asthma, uncomplicated: Secondary | ICD-10-CM | POA: Diagnosis not present

## 2012-01-26 MED ORDER — OMALIZUMAB 150 MG ~~LOC~~ SOLR
300.0000 mg | Freq: Once | SUBCUTANEOUS | Status: AC
  Administered 2012-01-26: 300 mg via SUBCUTANEOUS

## 2012-02-09 ENCOUNTER — Ambulatory Visit (INDEPENDENT_AMBULATORY_CARE_PROVIDER_SITE_OTHER): Payer: Medicare Other

## 2012-02-09 DIAGNOSIS — J45909 Unspecified asthma, uncomplicated: Secondary | ICD-10-CM | POA: Diagnosis not present

## 2012-02-10 MED ORDER — OMALIZUMAB 150 MG ~~LOC~~ SOLR
300.0000 mg | Freq: Once | SUBCUTANEOUS | Status: AC
  Administered 2012-02-10: 300 mg via SUBCUTANEOUS

## 2012-02-23 ENCOUNTER — Ambulatory Visit (INDEPENDENT_AMBULATORY_CARE_PROVIDER_SITE_OTHER): Payer: Medicare Other

## 2012-02-23 DIAGNOSIS — J45909 Unspecified asthma, uncomplicated: Secondary | ICD-10-CM

## 2012-02-24 ENCOUNTER — Ambulatory Visit: Payer: Medicare Other

## 2012-02-24 MED ORDER — OMALIZUMAB 150 MG ~~LOC~~ SOLR
300.0000 mg | Freq: Once | SUBCUTANEOUS | Status: AC
  Administered 2012-02-24: 300 mg via SUBCUTANEOUS

## 2012-03-08 ENCOUNTER — Ambulatory Visit (INDEPENDENT_AMBULATORY_CARE_PROVIDER_SITE_OTHER): Payer: Medicare Other

## 2012-03-08 DIAGNOSIS — J45909 Unspecified asthma, uncomplicated: Secondary | ICD-10-CM | POA: Diagnosis not present

## 2012-03-09 MED ORDER — OMALIZUMAB 150 MG ~~LOC~~ SOLR
300.0000 mg | Freq: Once | SUBCUTANEOUS | Status: AC
  Administered 2012-03-09: 300 mg via SUBCUTANEOUS

## 2012-03-16 DIAGNOSIS — M949 Disorder of cartilage, unspecified: Secondary | ICD-10-CM | POA: Diagnosis not present

## 2012-03-16 DIAGNOSIS — M899 Disorder of bone, unspecified: Secondary | ICD-10-CM | POA: Diagnosis not present

## 2012-03-17 ENCOUNTER — Other Ambulatory Visit: Payer: Self-pay | Admitting: Cardiovascular Disease

## 2012-03-17 NOTE — Telephone Encounter (Signed)
Fax Received. Refill Completed. [name] (R.M.A)   

## 2012-03-18 ENCOUNTER — Other Ambulatory Visit: Payer: Self-pay | Admitting: Critical Care Medicine

## 2012-03-24 ENCOUNTER — Ambulatory Visit (INDEPENDENT_AMBULATORY_CARE_PROVIDER_SITE_OTHER): Payer: Medicare Other

## 2012-03-24 ENCOUNTER — Encounter: Payer: Self-pay | Admitting: Critical Care Medicine

## 2012-03-24 ENCOUNTER — Ambulatory Visit (INDEPENDENT_AMBULATORY_CARE_PROVIDER_SITE_OTHER): Payer: Medicare Other | Admitting: Critical Care Medicine

## 2012-03-24 VITALS — BP 124/74 | HR 74 | Temp 97.2°F | Ht 64.0 in | Wt 165.0 lb

## 2012-03-24 DIAGNOSIS — J45909 Unspecified asthma, uncomplicated: Secondary | ICD-10-CM

## 2012-03-24 MED ORDER — BUDESONIDE-FORMOTEROL FUMARATE 160-4.5 MCG/ACT IN AERO: 2.0000 | INHALATION_SPRAY | Freq: Two times a day (BID) | RESPIRATORY_TRACT | Status: DC

## 2012-03-24 MED ORDER — MOMETASONE FUROATE 50 MCG/ACT NA SUSP: 2.0000 | Freq: Every day | NASAL | Status: DC

## 2012-03-24 MED ORDER — AZELASTINE HCL 0.1 % NA SOLN: 1.0000 | Freq: Two times a day (BID) | NASAL | Status: DC

## 2012-03-24 MED ORDER — OMALIZUMAB 150 MG ~~LOC~~ SOLR
300.0000 mg | Freq: Once | SUBCUTANEOUS | Status: AC
  Administered 2012-03-24: 300 mg via SUBCUTANEOUS

## 2012-03-24 NOTE — Patient Instructions (Addendum)
No change in medications. Return in        2 months 

## 2012-03-24 NOTE — Progress Notes (Signed)
Subjective:    Patient ID: [name], female    DOB: 09/30/1932, 78 y.o.   MRN: [id number]  HPI 78 y.o. Moderate persistent asthma with severe atopy  03/24/2012 No new changes.  Notes cough is better.  If starts to eat will have some dysphagia and will cough. No real wheezing. Cough is dry, occ prod with pndrip.   Pt denies any significant sore throat, nasal congestion or excess secretions, fever, chills, sweats, unintended weight loss, pleurtic or exertional chest pain, orthopnea PND, or leg swelling Pt denies any increase in rescue therapy over baseline, denies waking up needing it or having any early am or nocturnal exacerbations of coughing/wheezing/or dyspnea. Pt also denies any obvious fluctuation in symptoms with  weather or environmental change or other alleviating or aggravating factors     Past Medical History  Diagnosis Date  . Restless legs syndrome (RLS)   . Osteoporosis, unspecified     Knee and hip osteoarthritis bilaterally  . Unspecified essential hypertension   . Other diseases of vocal cords   . Unspecified asthma   . Esophageal reflux   . Allergic rhinitis, cause unspecified   . Obstructive chronic bronchitis without exacerbation   . Hyperlipidemia   . Gallbladder disease   . Degenerative disk disease   . Perennial allergic rhinitis   . Diverticulosis   . Lymphedema     in the right arm  . Osteopenia   . Shingles 2007  . Chronic kidney disease, stage II (mild)   . Cancer      Family History  Problem Relation Age of Onset  . Heart attack Mother   . Heart attack Father   . Cancer Brother   . Asthma Brother   . Coronary artery disease Sister   . Asthma Mother   . Heart failure Father      History   Social History  . Marital Status: Married    Spouse Name: N/A    Number of Children: N/A  . Years of Education: N/A   Occupational History  . Not on file.   Social History Main Topics  . Smoking status: Never Smoker   . Smokeless  tobacco: Never Used  . Alcohol Use: No  . Drug Use: No  . Sexually Active: Not on file   Other Topics Concern  . Not on file   Social History Narrative  . No narrative on file     Allergies  Allergen Reactions  . Cephalosporins     REACTION: rash  . Codeine     REACTION: rash  . Eggs Or Egg-Derived Products   . Levofloxacin     REACTION: rash  . Penicillins     REACTION: rash     Outpatient Prescriptions Prior to Visit  Medication Sig Dispense Refill  . albuterol (PROAIR HFA) 108 (90 BASE) MCG/ACT inhaler Inhale 2 puffs into the lungs every 6 (six) hours as needed.  1 Inhaler  6  . ALPRAZolam (XANAX) 0.5 MG tablet Take 1/2 tab twice daily as needed      . aspirin 81 MG tablet Take 81 mg by mouth daily.        . benzonatate (TESSALON) 100 MG capsule Take 1 capsule (100 mg total) by mouth 3 (three) times daily as needed.  90 capsule  6  . Calcium Carbonate-Vitamin D (CALTRATE 600+D) 600-400 MG-UNIT per tablet Take 1 tablet by mouth daily.       . chlorpheniramine (CHLOR-TRIMETON) 4 MG tablet Take 4 mg  by mouth as needed.      . Cholecalciferol (VITAMIN D3) 1000 UNITS tablet Take 1,000 Units by mouth daily.        . CRESTOR 5 MG tablet TAKE 1 TABLET DAILY  90 tablet  3  . esomeprazole (NEXIUM) 40 MG capsule Take 1 capsule twice a day  180 capsule  3  . fexofenadine (ALLEGRA) 180 MG tablet Take 180 mg by mouth daily.        . furosemide (LASIX) 40 MG tablet Take 1 tablet by mouth daily.      [name] ibandronate (BONIVA) 150 MG tablet Take 150 mg by mouth every 30 (thirty) days. Take in the morning with a full glass of water, on an empty stomach, and do not take anything else by mouth or lie down for the next 30 min.       [name] MICARDIS 40 MG tablet TAKE 1 TABLET DAILY  90 tablet  2  . montelukast (SINGULAIR) 10 MG tablet TAKE 1 TABLET AT BEDTIME  90 tablet  2  . omalizumab (XOLAIR) 150 MG injection Inject 150 mg into the skin every 14 (fourteen) days.       . potassium chloride  (K-DUR,KLOR-CON) 10 MEQ tablet Take 20 mEq by mouth daily. Two tablets once daily      . pramipexole (MIRAPEX) 0.125 MG tablet 3 tablets at bedtime       . Spacer/Aero-Holding Chambers (AEROCHAMBER MV) inhaler by Other route. Use as instructed       . traMADol (ULTRAM) 50 MG tablet Take 1-2 tablets (50-100 mg total) by mouth at bedtime as needed for pain.  60 tablet  4  . vitamin B-12 (CYANOCOBALAMIN) 500 MCG tablet Take 500 mcg by mouth daily.        . budesonide-formoterol (SYMBICORT) 160-4.5 MCG/ACT inhaler Inhale 2 puffs into the lungs daily.      . benzonatate (TESSALON) 100 MG capsule TAKE 1 CAPSULE THREE TIMES A DAY AS NEEDED  90 capsule  5      Review of Systems  .Constitutional:   No  weight loss, night sweats,  Fevers, chills,  + fatigue, or  lassitude.  HEENT:   No headaches,  Difficulty swallowing,  Tooth/dental problems, or  Sore throat,                No sneezing, itching, ear ache,  +nasal congestion, post nasal drip,   CV:  No chest pain,  Orthopnea, PND, swelling in lower extremities, anasarca, dizziness, palpitations, syncope.   GI  No heartburn, indigestion, abdominal pain, nausea, vomiting, diarrhea, change in bowel habits, loss of appetite, bloody stools.   Resp:    No coughing up of blood.    No chest wall deformity  Skin: no rash or lesions.  GU: no dysuria, change in color of urine, no urgency or frequency.  No flank pain, no hematuria   MS:  No joint pain or swelling.  No decreased range of motion.  No back pain.  Psych:  No change in mood or affect. No depression or anxiety.  No memory loss.         Objective:   Physical Exam BP 124/74  Pulse 74  Temp 97.2 F (36.2 C)  Ht 5\' 4"  (1.626 m)  Wt 165 lb (74.844 kg)  BMI 28.32 kg/m2  SpO2 97%  Gen: Pleasant, well-nourished, in no distress,  normal affect  ENT: No lesions,  mouth clear,  oropharynx clear, + postnasal drip  Neck: No  JVD, no TMG, no carotid bruits  Lungs: No use of accessory  muscles, no dullness to percussion, distant BS  Cardiovascular: RRR, heart sounds normal, no murmur or gallops, no peripheral edema  Abdomen: soft and NT, no HSM,  BS normal  Musculoskeletal: No deformities, no cyanosis or clubbing  Neuro: alert, non focal  Skin: Warm, no lesions or rashes       Assessment & Plan:   Asthma with allergic rhinitis Severe persistent asthma stable at this time Plan Maintain inhaled medications as prescribed Return 2 months    Updated Medication List Outpatient Encounter Prescriptions as of 03/24/2012  Medication Sig Dispense Refill  . albuterol (PROAIR HFA) 108 (90 BASE) MCG/ACT inhaler Inhale 2 puffs into the lungs every 6 (six) hours as needed.  1 Inhaler  6  . ALPRAZolam (XANAX) 0.5 MG tablet Take 1/2 tab twice daily as needed      . aspirin 81 MG tablet Take 81 mg by mouth daily.        [name] azelastine (ASTELIN) 137 MCG/SPRAY nasal spray Place 1 spray into the nose 2 (two) times daily. Use in each nostril as directed  90 mL  4  . benzonatate (TESSALON) 100 MG capsule Take 1 capsule (100 mg total) by mouth 3 (three) times daily as needed.  90 capsule  6  . budesonide-formoterol (SYMBICORT) 160-4.5 MCG/ACT inhaler Inhale 2 puffs into the lungs 2 (two) times daily.  3 Inhaler  4  . Calcium Carbonate-Vitamin D (CALTRATE 600+D) 600-400 MG-UNIT per tablet Take 1 tablet by mouth daily.       . chlorpheniramine (CHLOR-TRIMETON) 4 MG tablet Take 4 mg by mouth as needed.      . Cholecalciferol (VITAMIN D3) 1000 UNITS tablet Take 1,000 Units by mouth daily.        . CRESTOR 5 MG tablet TAKE 1 TABLET DAILY  90 tablet  3  . esomeprazole (NEXIUM) 40 MG capsule Take 1 capsule twice a day  180 capsule  3  . fexofenadine (ALLEGRA) 180 MG tablet Take 180 mg by mouth daily.        . furosemide (LASIX) 40 MG tablet Take 1 tablet by mouth daily.      [name] ibandronate (BONIVA) 150 MG tablet Take 150 mg by mouth every 30 (thirty) days. Take in the morning with a full  glass of water, on an empty stomach, and do not take anything else by mouth or lie down for the next 30 min.       [name] KLOR-CON 10 10 MEQ tablet Take 1 tablet by mouth Daily.      [name] MICARDIS 40 MG tablet TAKE 1 TABLET DAILY  90 tablet  2  . mometasone (NASONEX) 50 MCG/ACT nasal spray Place 2 sprays into the nose daily.  51 g  4  . montelukast (SINGULAIR) 10 MG tablet TAKE 1 TABLET AT BEDTIME  90 tablet  2  . omalizumab (XOLAIR) 150 MG injection Inject 150 mg into the skin every 14 (fourteen) days.       . potassium chloride (K-DUR,KLOR-CON) 10 MEQ tablet Take 20 mEq by mouth daily. Two tablets once daily      . pramipexole (MIRAPEX) 0.125 MG tablet 3 tablets at bedtime       . Spacer/Aero-Holding Chambers (AEROCHAMBER MV) inhaler by Other route. Use as instructed       . traMADol (ULTRAM) 50 MG tablet Take 1-2 tablets (50-100 mg total) by mouth at bedtime as needed for pain.  60 tablet  4  . vitamin B-12 (CYANOCOBALAMIN) 500 MCG tablet Take 500 mcg by mouth daily.        . [DISCONTINUED] azelastine (ASTELIN) 137 MCG/SPRAY nasal spray Place 1 spray into the nose 2 (two) times daily. Use in each nostril as directed      . [DISCONTINUED] budesonide-formoterol (SYMBICORT) 160-4.5 MCG/ACT inhaler Inhale 2 puffs into the lungs daily.      . [DISCONTINUED] mometasone (NASONEX) 50 MCG/ACT nasal spray Place 2 sprays into the nose daily.      . [DISCONTINUED] benzonatate (TESSALON) 100 MG capsule TAKE 1 CAPSULE THREE TIMES A DAY AS NEEDED  90 capsule  5

## 2012-03-24 NOTE — Assessment & Plan Note (Addendum)
Severe persistent asthma stable at this time Plan Maintain inhaled medications as prescribed Return 2 months

## 2012-04-07 ENCOUNTER — Ambulatory Visit (INDEPENDENT_AMBULATORY_CARE_PROVIDER_SITE_OTHER): Payer: Medicare Other

## 2012-04-07 DIAGNOSIS — J45909 Unspecified asthma, uncomplicated: Secondary | ICD-10-CM | POA: Diagnosis not present

## 2012-04-07 MED ORDER — OMALIZUMAB 150 MG ~~LOC~~ SOLR
300.0000 mg | Freq: Once | SUBCUTANEOUS | Status: AC
  Administered 2012-04-07: 300 mg via SUBCUTANEOUS

## 2012-04-19 DIAGNOSIS — M1710 Unilateral primary osteoarthritis, unspecified knee: Secondary | ICD-10-CM | POA: Diagnosis not present

## 2012-04-21 ENCOUNTER — Ambulatory Visit (INDEPENDENT_AMBULATORY_CARE_PROVIDER_SITE_OTHER): Payer: Medicare Other

## 2012-04-21 DIAGNOSIS — J45909 Unspecified asthma, uncomplicated: Secondary | ICD-10-CM

## 2012-04-22 DIAGNOSIS — J45909 Unspecified asthma, uncomplicated: Secondary | ICD-10-CM

## 2012-04-22 MED ORDER — OMALIZUMAB 150 MG ~~LOC~~ SOLR
300.0000 mg | Freq: Once | SUBCUTANEOUS | Status: AC
  Administered 2012-04-22: 300 mg via SUBCUTANEOUS

## 2012-05-06 ENCOUNTER — Ambulatory Visit (INDEPENDENT_AMBULATORY_CARE_PROVIDER_SITE_OTHER): Payer: Medicare Other

## 2012-05-06 DIAGNOSIS — J45909 Unspecified asthma, uncomplicated: Secondary | ICD-10-CM | POA: Diagnosis not present

## 2012-05-07 MED ORDER — OMALIZUMAB 150 MG ~~LOC~~ SOLR
300.0000 mg | Freq: Once | SUBCUTANEOUS | Status: AC
  Administered 2012-05-07: 300 mg via SUBCUTANEOUS

## 2012-05-20 ENCOUNTER — Ambulatory Visit (INDEPENDENT_AMBULATORY_CARE_PROVIDER_SITE_OTHER): Payer: Medicare Other

## 2012-05-20 ENCOUNTER — Other Ambulatory Visit: Payer: Self-pay | Admitting: Critical Care Medicine

## 2012-05-20 DIAGNOSIS — J45909 Unspecified asthma, uncomplicated: Secondary | ICD-10-CM | POA: Diagnosis not present

## 2012-05-21 DIAGNOSIS — M1710 Unilateral primary osteoarthritis, unspecified knee: Secondary | ICD-10-CM | POA: Diagnosis not present

## 2012-05-26 ENCOUNTER — Encounter: Payer: Self-pay | Admitting: Critical Care Medicine

## 2012-05-26 ENCOUNTER — Ambulatory Visit (INDEPENDENT_AMBULATORY_CARE_PROVIDER_SITE_OTHER): Payer: Medicare Other | Admitting: Critical Care Medicine

## 2012-05-26 VITALS — BP 110/70 | HR 94 | Temp 97.8°F | Ht 64.0 in | Wt 160.2 lb

## 2012-05-26 DIAGNOSIS — J45909 Unspecified asthma, uncomplicated: Secondary | ICD-10-CM | POA: Diagnosis not present

## 2012-05-26 DIAGNOSIS — M1990 Unspecified osteoarthritis, unspecified site: Secondary | ICD-10-CM | POA: Insufficient documentation

## 2012-05-26 MED ORDER — OMALIZUMAB 150 MG ~~LOC~~ SOLR
300.0000 mg | Freq: Once | SUBCUTANEOUS | Status: AC
  Administered 2012-05-26: 300 mg via SUBCUTANEOUS

## 2012-05-26 NOTE — Progress Notes (Signed)
Subjective:    Patient ID: [name], female    DOB: 11-21-1932, 79 y.o.   MRN: [id number]  HPI 79 y.o. Moderate persistent asthma with severe atopy  03/24/2012 No new changes.  Notes cough is better.  If starts to eat will have some dysphagia and will cough. No real wheezing. Cough is dry, occ prod with pndrip.   Pt denies any significant sore throat, nasal congestion or excess secretions, fever, chills, sweats, unintended weight loss, pleurtic or exertional chest pain, orthopnea PND, or leg swelling Pt denies any increase in rescue therapy over baseline, denies waking up needing it or having any early am or nocturnal exacerbations of coughing/wheezing/or dyspnea. Pt also denies any obvious fluctuation in symptoms with  weather or environmental change or other alleviating or aggravating factors   05/26/2012 Pt is at baseline. Cough is still present.  NOtes some pndrip.  Not much wheeze.  Not much dyspnea is noted. Pt denies any significant sore throat, nasal congestion or excess secretions, fever, chills, sweats, unintended weight loss, pleurtic or exertional chest pain, orthopnea PND, or leg swelling Pt denies any increase in rescue therapy over baseline, denies waking up needing it or having any early am or nocturnal exacerbations of coughing/wheezing/or dyspnea. Pt also denies any obvious fluctuation in symptoms with  weather or environmental change or other alleviating or aggravating factors     Past Medical History  Diagnosis Date  . Restless legs syndrome (RLS)   . Osteoporosis, unspecified     Knee and hip osteoarthritis bilaterally  . Unspecified essential hypertension   . Other diseases of vocal cords   . Unspecified asthma   . Esophageal reflux   . Allergic rhinitis, cause unspecified   . Obstructive chronic bronchitis without exacerbation   . Hyperlipidemia   . Gallbladder disease   . Degenerative disk disease   . Perennial allergic rhinitis   . Diverticulosis     . Lymphedema     in the right arm  . Osteopenia   . Shingles 2007  . Chronic kidney disease, stage II (mild)   . Cancer      Family History  Problem Relation Age of Onset  . Heart attack Mother   . Heart attack Father   . Cancer Brother   . Asthma Brother   . Coronary artery disease Sister   . Asthma Mother   . Heart failure Father      History   Social History  . Marital Status: Married    Spouse Name: N/A    Number of Children: N/A  . Years of Education: N/A   Occupational History  . Not on file.   Social History Main Topics  . Smoking status: Never Smoker   . Smokeless tobacco: Never Used  . Alcohol Use: No  . Drug Use: No  . Sexually Active: Not on file   Other Topics Concern  . Not on file   Social History Narrative  . No narrative on file     Allergies  Allergen Reactions  . Cephalosporins     REACTION: rash  . Codeine     REACTION: rash  . Eggs Or Egg-Derived Products   . Levofloxacin     REACTION: rash  . Penicillins     REACTION: rash     Outpatient Prescriptions Prior to Visit  Medication Sig Dispense Refill  . albuterol (PROAIR HFA) 108 (90 BASE) MCG/ACT inhaler Inhale 2 puffs into the lungs every 6 (six) hours as needed.  1 Inhaler  6  . ALPRAZolam (XANAX) 0.5 MG tablet Take 1/2 tab twice daily as needed      . aspirin 81 MG tablet Take 81 mg by mouth daily.        [name] azelastine (ASTELIN) 137 MCG/SPRAY nasal spray Place 1 spray into the nose 2 (two) times daily. Use in each nostril as directed  90 mL  4  . benzonatate (TESSALON) 100 MG capsule Take 1 capsule (100 mg total) by mouth 3 (three) times daily as needed.  90 capsule  6  . budesonide-formoterol (SYMBICORT) 160-4.5 MCG/ACT inhaler Inhale 2 puffs into the lungs 2 (two) times daily.  3 Inhaler  4  . Calcium Carbonate-Vitamin D (CALTRATE 600+D) 600-400 MG-UNIT per tablet Take 1 tablet by mouth daily.       . chlorpheniramine (CHLOR-TRIMETON) 4 MG tablet Take 4 mg by mouth as needed.       . Cholecalciferol (VITAMIN D3) 1000 UNITS tablet Take 1,000 Units by mouth daily.        . CRESTOR 5 MG tablet TAKE 1 TABLET DAILY  90 tablet  3  . esomeprazole (NEXIUM) 40 MG capsule Take 1 capsule twice a day  180 capsule  3  . fexofenadine (ALLEGRA) 180 MG tablet Take 180 mg by mouth daily.        . furosemide (LASIX) 40 MG tablet Take 1 tablet by mouth daily.      [name] ibandronate (BONIVA) 150 MG tablet Take 150 mg by mouth every 30 (thirty) days. Take in the morning with a full glass of water, on an empty stomach, and do not take anything else by mouth or lie down for the next 30 min.       [name] MICARDIS 40 MG tablet TAKE 1 TABLET DAILY  90 tablet  2  . mometasone (NASONEX) 50 MCG/ACT nasal spray Place 2 sprays into the nose daily.  51 g  4  . montelukast (SINGULAIR) 10 MG tablet TAKE 1 TABLET AT BEDTIME  90 tablet  1  . omalizumab (XOLAIR) 150 MG injection Inject 150 mg into the skin every 14 (fourteen) days.       . potassium chloride (K-DUR,KLOR-CON) 10 MEQ tablet Take 20 mEq by mouth daily. Two tablets once daily      . pramipexole (MIRAPEX) 0.125 MG tablet 3 tablets at bedtime       . Spacer/Aero-Holding Chambers (AEROCHAMBER MV) inhaler by Other route. Use as instructed       . traMADol (ULTRAM) 50 MG tablet Take 1-2 tablets (50-100 mg total) by mouth at bedtime as needed for pain.  60 tablet  4  . vitamin B-12 (CYANOCOBALAMIN) 500 MCG tablet Take 500 mcg by mouth daily.        . [DISCONTINUED] KLOR-CON 10 10 MEQ tablet Take 1 tablet by mouth Daily.      Last reviewed on 05/26/2012 10:41 AM by Storm Frisk, MD    Review of Systems  .Constitutional:   No  weight loss, night sweats,  Fevers, chills,  + fatigue, or  lassitude.  HEENT:   No headaches,  Difficulty swallowing,  Tooth/dental problems, or  Sore throat,                No sneezing, itching, ear ache,  +nasal congestion, post nasal drip,   CV:  No chest pain,  Orthopnea, PND, swelling in lower extremities, anasarca,  dizziness, palpitations, syncope.   GI  No heartburn, indigestion, abdominal pain, nausea,  vomiting, diarrhea, change in bowel habits, loss of appetite, bloody stools.   Resp:    No coughing up of blood.    No chest wall deformity  Skin: no rash or lesions.  GU: no dysuria, change in color of urine, no urgency or frequency.  No flank pain, no hematuria   MS:  No joint pain or swelling.  No decreased range of motion.  No back pain.  Psych:  No change in mood or affect. No depression or anxiety.  No memory loss.         Objective:   Physical Exam BP 110/70  Pulse 94  Temp 97.8 F (36.6 C) (Oral)  Ht 5\' 4"  (1.626 m)  Wt 160 lb 3.2 oz (72.666 kg)  BMI 27.50 kg/m2  SpO2 96%  Gen: Pleasant, well-nourished, in no distress,  normal affect  ENT: No lesions,  mouth clear,  oropharynx clear, + postnasal drip  Neck: No JVD, no TMG, no carotid bruits  Lungs: No use of accessory muscles, no dullness to percussion, distant BS  Cardiovascular: RRR, heart sounds normal, no murmur or gallops, no peripheral edema  Abdomen: soft and NT, no HSM,  BS normal  Musculoskeletal: No deformities, no cyanosis or clubbing  Neuro: alert, non focal  Skin: Warm, no lesions or rashes       Assessment & Plan:   Asthma with allergic rhinitis  moderate persistent asthma with significant atopic features stable at this time Plan Maintain inhaled medications as prescribed    Updated Medication List Outpatient Encounter Prescriptions as of 05/26/2012  Medication Sig Dispense Refill  . albuterol (PROAIR HFA) 108 (90 BASE) MCG/ACT inhaler Inhale 2 puffs into the lungs every 6 (six) hours as needed.  1 Inhaler  6  . ALPRAZolam (XANAX) 0.5 MG tablet Take 1/2 tab twice daily as needed      . aspirin 81 MG tablet Take 81 mg by mouth daily.        [name] azelastine (ASTELIN) 137 MCG/SPRAY nasal spray Place 1 spray into the nose 2 (two) times daily. Use in each nostril as directed  90 mL  4  .  benzonatate (TESSALON) 100 MG capsule Take 1 capsule (100 mg total) by mouth 3 (three) times daily as needed.  90 capsule  6  . budesonide-formoterol (SYMBICORT) 160-4.5 MCG/ACT inhaler Inhale 2 puffs into the lungs 2 (two) times daily.  3 Inhaler  4  . Calcium Carbonate-Vitamin D (CALTRATE 600+D) 600-400 MG-UNIT per tablet Take 1 tablet by mouth daily.       . chlorpheniramine (CHLOR-TRIMETON) 4 MG tablet Take 4 mg by mouth as needed.      . Cholecalciferol (VITAMIN D3) 1000 UNITS tablet Take 1,000 Units by mouth daily.        . CRESTOR 5 MG tablet TAKE 1 TABLET DAILY  90 tablet  3  . esomeprazole (NEXIUM) 40 MG capsule Take 1 capsule twice a day  180 capsule  3  . fexofenadine (ALLEGRA) 180 MG tablet Take 180 mg by mouth daily.        . furosemide (LASIX) 40 MG tablet Take 1 tablet by mouth daily.      [name] ibandronate (BONIVA) 150 MG tablet Take 150 mg by mouth every 30 (thirty) days. Take in the morning with a full glass of water, on an empty stomach, and do not take anything else by mouth or lie down for the next 30 min.       [name] MICARDIS 40 MG  tablet TAKE 1 TABLET DAILY  90 tablet  2  . mometasone (NASONEX) 50 MCG/ACT nasal spray Place 2 sprays into the nose daily.  51 g  4  . montelukast (SINGULAIR) 10 MG tablet TAKE 1 TABLET AT BEDTIME  90 tablet  1  . omalizumab (XOLAIR) 150 MG injection Inject 150 mg into the skin every 14 (fourteen) days.       . potassium chloride (K-DUR,KLOR-CON) 10 MEQ tablet Take 20 mEq by mouth daily. Two tablets once daily      . pramipexole (MIRAPEX) 0.125 MG tablet 3 tablets at bedtime       . Spacer/Aero-Holding Chambers (AEROCHAMBER MV) inhaler by Other route. Use as instructed       . traMADol (ULTRAM) 50 MG tablet Take 1-2 tablets (50-100 mg total) by mouth at bedtime as needed for pain.  60 tablet  4  . trolamine salicylate (ASPERCREME) 10 % cream Apply topically as needed.      . vitamin B-12 (CYANOCOBALAMIN) 500 MCG tablet Take 500 mcg by mouth daily.          . [DISCONTINUED] KLOR-CON 10 10 MEQ tablet Take 1 tablet by mouth Daily.

## 2012-05-26 NOTE — Assessment & Plan Note (Signed)
moderate persistent asthma with significant atopic features stable at this time Plan Maintain inhaled medications as prescribed

## 2012-05-26 NOTE — Patient Instructions (Addendum)
No change in medications. Return in        2 months 

## 2012-05-28 DIAGNOSIS — J45909 Unspecified asthma, uncomplicated: Secondary | ICD-10-CM | POA: Diagnosis not present

## 2012-05-28 DIAGNOSIS — J309 Allergic rhinitis, unspecified: Secondary | ICD-10-CM | POA: Diagnosis not present

## 2012-06-03 ENCOUNTER — Ambulatory Visit (INDEPENDENT_AMBULATORY_CARE_PROVIDER_SITE_OTHER): Payer: Medicare Other

## 2012-06-03 ENCOUNTER — Ambulatory Visit: Payer: Medicare Other

## 2012-06-03 DIAGNOSIS — J45909 Unspecified asthma, uncomplicated: Secondary | ICD-10-CM

## 2012-06-07 MED ORDER — OMALIZUMAB 150 MG ~~LOC~~ SOLR
300.0000 mg | Freq: Once | SUBCUTANEOUS | Status: AC
  Administered 2012-06-07: 300 mg via SUBCUTANEOUS

## 2012-06-17 ENCOUNTER — Ambulatory Visit (INDEPENDENT_AMBULATORY_CARE_PROVIDER_SITE_OTHER): Payer: Medicare Other

## 2012-06-17 DIAGNOSIS — J45909 Unspecified asthma, uncomplicated: Secondary | ICD-10-CM | POA: Diagnosis not present

## 2012-06-18 MED ORDER — OMALIZUMAB 150 MG ~~LOC~~ SOLR
300.0000 mg | Freq: Once | SUBCUTANEOUS | Status: AC
  Administered 2012-06-18: 300 mg via SUBCUTANEOUS

## 2012-06-18 MED ORDER — OMALIZUMAB 150 MG ~~LOC~~ SOLR: 375.0000 mg | Freq: Once | SUBCUTANEOUS | Status: DC

## 2012-07-01 ENCOUNTER — Ambulatory Visit (INDEPENDENT_AMBULATORY_CARE_PROVIDER_SITE_OTHER): Payer: Medicare Other

## 2012-07-01 DIAGNOSIS — J45909 Unspecified asthma, uncomplicated: Secondary | ICD-10-CM | POA: Diagnosis not present

## 2012-07-01 MED ORDER — OMALIZUMAB 150 MG ~~LOC~~ SOLR
300.0000 mg | Freq: Once | SUBCUTANEOUS | Status: AC
  Administered 2012-07-01: 300 mg via SUBCUTANEOUS

## 2012-07-15 ENCOUNTER — Ambulatory Visit (INDEPENDENT_AMBULATORY_CARE_PROVIDER_SITE_OTHER): Payer: Medicare Other

## 2012-07-15 DIAGNOSIS — J45909 Unspecified asthma, uncomplicated: Secondary | ICD-10-CM | POA: Diagnosis not present

## 2012-07-15 MED ORDER — OMALIZUMAB 150 MG ~~LOC~~ SOLR
300.0000 mg | Freq: Once | SUBCUTANEOUS | Status: AC
  Administered 2012-07-15: 300 mg via SUBCUTANEOUS

## 2012-07-21 ENCOUNTER — Encounter: Payer: Self-pay | Admitting: Critical Care Medicine

## 2012-07-21 ENCOUNTER — Ambulatory Visit (INDEPENDENT_AMBULATORY_CARE_PROVIDER_SITE_OTHER): Payer: Medicare Other | Admitting: Critical Care Medicine

## 2012-07-21 VITALS — BP 108/66 | HR 74 | Temp 97.3°F | Ht 65.0 in | Wt 160.2 lb

## 2012-07-21 DIAGNOSIS — J45909 Unspecified asthma, uncomplicated: Secondary | ICD-10-CM | POA: Diagnosis not present

## 2012-07-21 DIAGNOSIS — M1710 Unilateral primary osteoarthritis, unspecified knee: Secondary | ICD-10-CM | POA: Diagnosis not present

## 2012-07-21 MED ORDER — METHYLPREDNISOLONE ACETATE 80 MG/ML IJ SUSP
120.0000 mg | Freq: Once | INTRAMUSCULAR | Status: AC
  Administered 2012-07-21: 120 mg via INTRAMUSCULAR

## 2012-07-21 NOTE — Progress Notes (Signed)
Subjective:    Patient ID: [name], female    DOB: [birth year], 79 y.o.   MRN: [id number]  HPI 79 y.o. Moderate persistent asthma with severe atopy   05/26/2012 Pt is at baseline. Cough is still present.  NOtes some pndrip.  Not much wheeze.  Not much dyspnea is noted. Pt denies any significant sore throat, nasal congestion or excess secretions, fever, chills, sweats, unintended weight loss, pleurtic or exertional chest pain, orthopnea PND, or leg swelling Pt denies any increase in rescue therapy over baseline, denies waking up needing it or having any early am or nocturnal exacerbations of coughing/wheezing/or dyspnea. Pt also denies any obvious fluctuation in symptoms with  weather or environmental change or other alleviating or aggravating factors  07/21/2012 Pt ill today , 3 days of chest tightness.  Notes more cough, dry.  Occ clear mucus.  Notes some dyspnea.  Notes some wheezing.  No gerd symptoms  PUL ASTHMA HISTORY 07/22/2012 07/21/2012 08/30/2010  Symptoms Daily Daily Throughout the day  Nighttime awakenings 3-4/month 3-4/month >1/wk but not nightly  Interference with activity Some limitations Some limitations Some limitations  SABA use Daily Daily Daily  Exacerbations requiring oral steroids 2 or more / year - 0-1 / year      Past Medical History  Diagnosis Date  . Restless legs syndrome (RLS)   . Osteoporosis, unspecified     Knee and hip osteoarthritis bilaterally  . Unspecified essential hypertension   . Other diseases of vocal cords   . Unspecified asthma   . Esophageal reflux   . Allergic rhinitis, cause unspecified   . Obstructive chronic bronchitis without exacerbation   . Hyperlipidemia   . Gallbladder disease   . Degenerative disk disease   . Perennial allergic rhinitis   . Diverticulosis   . Lymphedema     in the right arm  . Osteopenia   . Shingles 2007  . Chronic kidney disease, stage II (mild)   . Cancer      Family History  Problem Relation  Age of Onset  . Heart attack Mother   . Heart attack Father   . Cancer Brother   . Asthma Brother   . Coronary artery disease Sister   . Asthma Mother   . Heart failure Father      History   Social History  . Marital Status: Married    Spouse Name: N/A    Number of Children: N/A  . Years of Education: N/A   Occupational History  . Not on file.   Social History Main Topics  . Smoking status: Never Smoker   . Smokeless tobacco: Never Used  . Alcohol Use: No  . Drug Use: No  . Sexually Active: Not on file   Other Topics Concern  . Not on file   Social History Narrative  . No narrative on file     Allergies  Allergen Reactions  . Cephalosporins     REACTION: rash  . Codeine     REACTION: rash  . Eggs Or Egg-Derived Products   . Levofloxacin     REACTION: rash  . Penicillins     REACTION: rash     Outpatient Prescriptions Prior to Visit  Medication Sig Dispense Refill  . albuterol (PROAIR HFA) 108 (90 BASE) MCG/ACT inhaler Inhale 2 puffs into the lungs every 6 (six) hours as needed.  1 Inhaler  6  . ALPRAZolam (XANAX) 0.5 MG tablet Take 1/2 tab twice daily as needed      .  aspirin 81 MG tablet Take 81 mg by mouth daily.        [name] azelastine (ASTELIN) 137 MCG/SPRAY nasal spray Place 1 spray into the nose 2 (two) times daily. Use in each nostril as directed  90 mL  4  . benzonatate (TESSALON) 100 MG capsule Take 1 capsule (100 mg total) by mouth 3 (three) times daily as needed.  90 capsule  6  . budesonide-formoterol (SYMBICORT) 160-4.5 MCG/ACT inhaler Inhale 2 puffs into the lungs 2 (two) times daily.  3 Inhaler  4  . Calcium Carbonate-Vitamin D (CALTRATE 600+D) 600-400 MG-UNIT per tablet Take 1 tablet by mouth daily.       . chlorpheniramine (CHLOR-TRIMETON) 4 MG tablet Take 4 mg by mouth as needed.      . Cholecalciferol (VITAMIN D3) 1000 UNITS tablet Take 1,000 Units by mouth daily.        . CRESTOR 5 MG tablet TAKE 1 TABLET DAILY  90 tablet  3  . esomeprazole  (NEXIUM) 40 MG capsule Take 1 capsule twice a day  180 capsule  3  . fexofenadine (ALLEGRA) 180 MG tablet Take 180 mg by mouth daily.        . furosemide (LASIX) 40 MG tablet Take 1 tablet by mouth daily.      [name] ibandronate (BONIVA) 150 MG tablet Take 150 mg by mouth every 30 (thirty) days. Take in the morning with a full glass of water, on an empty stomach, and do not take anything else by mouth or lie down for the next 30 min.       [name] MICARDIS 40 MG tablet TAKE 1 TABLET DAILY  90 tablet  2  . mometasone (NASONEX) 50 MCG/ACT nasal spray Place 2 sprays into the nose daily.  51 g  4  . montelukast (SINGULAIR) 10 MG tablet TAKE 1 TABLET AT BEDTIME  90 tablet  1  . omalizumab (XOLAIR) 150 MG injection Inject 150 mg into the skin every 14 (fourteen) days.       . potassium chloride (K-DUR,KLOR-CON) 10 MEQ tablet Take 20 mEq by mouth daily. Two tablets once daily      . pramipexole (MIRAPEX) 0.125 MG tablet 3 tablets at bedtime       . Spacer/Aero-Holding Chambers (AEROCHAMBER MV) inhaler by Other route. Use as instructed       . traMADol (ULTRAM) 50 MG tablet Take 1-2 tablets (50-100 mg total) by mouth at bedtime as needed for pain.  60 tablet  4  . trolamine salicylate (ASPERCREME) 10 % cream Apply topically as needed.      . vitamin B-12 (CYANOCOBALAMIN) 500 MCG tablet Take 500 mcg by mouth daily.         No facility-administered medications prior to visit.      Review of Systems  .Constitutional:   No  weight loss, night sweats,  Fevers, chills,  + fatigue, or  lassitude.  HEENT:   No headaches,  Difficulty swallowing,  Tooth/dental problems, or  Sore throat,                No sneezing, itching, ear ache,  +nasal congestion, post nasal drip,   CV:  No chest pain,  Orthopnea, PND, swelling in lower extremities, anasarca, dizziness, palpitations, syncope.   GI  No heartburn, indigestion, abdominal pain, nausea, vomiting, diarrhea, change in bowel habits, loss of appetite, bloody stools.    Resp:    No coughing up of blood.    No chest wall  deformity  Skin: no rash or lesions.  GU: no dysuria, change in color of urine, no urgency or frequency.  No flank pain, no hematuria   MS:  No joint pain or swelling.  No decreased range of motion.  No back pain.  Psych:  No change in mood or affect. No depression or anxiety.  No memory loss.         Objective:   Physical Exam BP 108/66  Pulse 74  Temp(Src) 97.3 F (36.3 C) (Oral)  Ht 5\' 5"  (1.651 m)  Wt 160 lb 3.2 oz (72.666 kg)  BMI 26.66 kg/m2  SpO2 96%  Gen: Pleasant, well-nourished, in no distress,  normal affect  ENT: No lesions,  mouth clear,  oropharynx clear, + postnasal drip  Neck: No JVD, no TMG, no carotid bruits  Lungs: No use of accessory muscles, no dullness to percussion, distant BS, exp wheeze  Cardiovascular: RRR, heart sounds normal, no murmur or gallops, no peripheral edema  Abdomen: soft and NT, no HSM,  BS normal  Musculoskeletal: No deformities, no cyanosis or clubbing  Neuro: alert, non focal  Skin: Warm, no lesions or rashes       Assessment & Plan:   Asthma with allergic rhinitis Severe persistent asthma with allergic rhinitis and upper airway instability syndrome and mild flare Plan 120 mg of Depo-Medrol given Continued inhaled medications     Updated Medication List Outpatient Encounter Prescriptions as of 07/21/2012  Medication Sig Dispense Refill  . albuterol (PROAIR HFA) 108 (90 BASE) MCG/ACT inhaler Inhale 2 puffs into the lungs every 6 (six) hours as needed.  1 Inhaler  6  . ALPRAZolam (XANAX) 0.5 MG tablet Take 1/2 tab twice daily as needed      . aspirin 81 MG tablet Take 81 mg by mouth daily.        [name] azelastine (ASTELIN) 137 MCG/SPRAY nasal spray Place 1 spray into the nose 2 (two) times daily. Use in each nostril as directed  90 mL  4  . benzonatate (TESSALON) 100 MG capsule Take 1 capsule (100 mg total) by mouth 3 (three) times daily as needed.  90 capsule  6   . budesonide-formoterol (SYMBICORT) 160-4.5 MCG/ACT inhaler Inhale 2 puffs into the lungs 2 (two) times daily.  3 Inhaler  4  . Calcium Carbonate-Vitamin D (CALTRATE 600+D) 600-400 MG-UNIT per tablet Take 1 tablet by mouth daily.       . chlorpheniramine (CHLOR-TRIMETON) 4 MG tablet Take 4 mg by mouth as needed.      . Cholecalciferol (VITAMIN D3) 1000 UNITS tablet Take 1,000 Units by mouth daily.        . CRESTOR 5 MG tablet TAKE 1 TABLET DAILY  90 tablet  3  . esomeprazole (NEXIUM) 40 MG capsule Take 1 capsule twice a day  180 capsule  3  . fexofenadine (ALLEGRA) 180 MG tablet Take 180 mg by mouth daily.        . furosemide (LASIX) 40 MG tablet Take 1 tablet by mouth daily.      [name] ibandronate (BONIVA) 150 MG tablet Take 150 mg by mouth every 30 (thirty) days. Take in the morning with a full glass of water, on an empty stomach, and do not take anything else by mouth or lie down for the next 30 min.       [name] MICARDIS 40 MG tablet TAKE 1 TABLET DAILY  90 tablet  2  . mometasone (NASONEX) 50 MCG/ACT nasal spray Place 2  sprays into the nose daily.  51 g  4  . montelukast (SINGULAIR) 10 MG tablet TAKE 1 TABLET AT BEDTIME  90 tablet  1  . omalizumab (XOLAIR) 150 MG injection Inject 150 mg into the skin every 14 (fourteen) days.       . potassium chloride (K-DUR,KLOR-CON) 10 MEQ tablet Take 20 mEq by mouth daily. Two tablets once daily      . pramipexole (MIRAPEX) 0.125 MG tablet 3 tablets at bedtime       . Spacer/Aero-Holding Chambers (AEROCHAMBER MV) inhaler by Other route. Use as instructed       . traMADol (ULTRAM) 50 MG tablet Take 1-2 tablets (50-100 mg total) by mouth at bedtime as needed for pain.  60 tablet  4  . trolamine salicylate (ASPERCREME) 10 % cream Apply topically as needed.      . vitamin B-12 (CYANOCOBALAMIN) 500 MCG tablet Take 500 mcg by mouth daily.        . [EXPIRED] methylPREDNISolone acetate (DEPO-MEDROL) injection 120 mg        No facility-administered encounter  medications on file as of 07/21/2012.

## 2012-07-21 NOTE — Patient Instructions (Addendum)
Depomedrol 120mg  given today No other medication changes Return 2 months

## 2012-07-22 NOTE — Assessment & Plan Note (Signed)
Severe persistent asthma with allergic rhinitis and upper airway instability syndrome and mild flare Plan 120 mg of Depo-Medrol given Continued inhaled medications

## 2012-07-29 ENCOUNTER — Ambulatory Visit (INDEPENDENT_AMBULATORY_CARE_PROVIDER_SITE_OTHER): Payer: Medicare Other

## 2012-07-29 DIAGNOSIS — J45909 Unspecified asthma, uncomplicated: Secondary | ICD-10-CM | POA: Diagnosis not present

## 2012-07-30 MED ORDER — OMALIZUMAB 150 MG ~~LOC~~ SOLR
300.0000 mg | Freq: Once | SUBCUTANEOUS | Status: AC
  Administered 2012-07-30: 300 mg via SUBCUTANEOUS

## 2012-08-05 ENCOUNTER — Encounter: Payer: Self-pay | Admitting: Cardiovascular Disease

## 2012-08-05 DIAGNOSIS — E039 Hypothyroidism, unspecified: Secondary | ICD-10-CM | POA: Diagnosis not present

## 2012-08-05 DIAGNOSIS — I10 Essential (primary) hypertension: Secondary | ICD-10-CM | POA: Diagnosis not present

## 2012-08-05 DIAGNOSIS — E785 Hyperlipidemia, unspecified: Secondary | ICD-10-CM | POA: Diagnosis not present

## 2012-08-05 DIAGNOSIS — M949 Disorder of cartilage, unspecified: Secondary | ICD-10-CM | POA: Diagnosis not present

## 2012-08-05 DIAGNOSIS — R82998 Other abnormal findings in urine: Secondary | ICD-10-CM | POA: Diagnosis not present

## 2012-08-05 DIAGNOSIS — R7301 Impaired fasting glucose: Secondary | ICD-10-CM | POA: Diagnosis not present

## 2012-08-05 DIAGNOSIS — E079 Disorder of thyroid, unspecified: Secondary | ICD-10-CM | POA: Diagnosis not present

## 2012-08-06 ENCOUNTER — Other Ambulatory Visit: Payer: Self-pay | Admitting: *Deleted

## 2012-08-06 MED ORDER — TELMISARTAN 40 MG PO TABS: 40.0000 mg | ORAL_TABLET | Freq: Every day | ORAL | Status: DC

## 2012-08-06 NOTE — Telephone Encounter (Signed)
Fax Received. Refill Completed. [name] (R.M.A)  NEED APPOINTMENT BY July 2014 

## 2012-08-06 NOTE — Telephone Encounter (Signed)
NEED APPOINTMENT BY July 2014

## 2012-08-11 DIAGNOSIS — H40019 Open angle with borderline findings, low risk, unspecified eye: Secondary | ICD-10-CM | POA: Diagnosis not present

## 2012-08-11 DIAGNOSIS — E119 Type 2 diabetes mellitus without complications: Secondary | ICD-10-CM | POA: Diagnosis not present

## 2012-08-11 DIAGNOSIS — Z961 Presence of intraocular lens: Secondary | ICD-10-CM | POA: Diagnosis not present

## 2012-08-12 ENCOUNTER — Ambulatory Visit (INDEPENDENT_AMBULATORY_CARE_PROVIDER_SITE_OTHER): Payer: Medicare Other

## 2012-08-12 DIAGNOSIS — K5730 Diverticulosis of large intestine without perforation or abscess without bleeding: Secondary | ICD-10-CM | POA: Diagnosis not present

## 2012-08-12 DIAGNOSIS — N182 Chronic kidney disease, stage 2 (mild): Secondary | ICD-10-CM | POA: Diagnosis not present

## 2012-08-12 DIAGNOSIS — J45909 Unspecified asthma, uncomplicated: Secondary | ICD-10-CM

## 2012-08-12 DIAGNOSIS — R609 Edema, unspecified: Secondary | ICD-10-CM | POA: Diagnosis not present

## 2012-08-12 DIAGNOSIS — F411 Generalized anxiety disorder: Secondary | ICD-10-CM | POA: Diagnosis not present

## 2012-08-12 DIAGNOSIS — N951 Menopausal and female climacteric states: Secondary | ICD-10-CM | POA: Diagnosis not present

## 2012-08-12 DIAGNOSIS — Z1331 Encounter for screening for depression: Secondary | ICD-10-CM | POA: Diagnosis not present

## 2012-08-12 DIAGNOSIS — C50919 Malignant neoplasm of unspecified site of unspecified female breast: Secondary | ICD-10-CM | POA: Diagnosis not present

## 2012-08-12 DIAGNOSIS — Z0000 Encounter for general adult medical examination without abnormal findings: Secondary | ICD-10-CM | POA: Diagnosis not present

## 2012-08-12 DIAGNOSIS — J309 Allergic rhinitis, unspecified: Secondary | ICD-10-CM | POA: Diagnosis not present

## 2012-08-16 DIAGNOSIS — Z1212 Encounter for screening for malignant neoplasm of rectum: Secondary | ICD-10-CM | POA: Diagnosis not present

## 2012-08-16 MED ORDER — OMALIZUMAB 150 MG ~~LOC~~ SOLR
300.0000 mg | Freq: Once | SUBCUTANEOUS | Status: AC
  Administered 2012-08-16: 300 mg via SUBCUTANEOUS

## 2012-08-26 ENCOUNTER — Ambulatory Visit (INDEPENDENT_AMBULATORY_CARE_PROVIDER_SITE_OTHER): Payer: Medicare Other

## 2012-08-26 DIAGNOSIS — J45909 Unspecified asthma, uncomplicated: Secondary | ICD-10-CM

## 2012-08-30 MED ORDER — OMALIZUMAB 150 MG ~~LOC~~ SOLR
300.0000 mg | Freq: Once | SUBCUTANEOUS | Status: AC
  Administered 2012-08-30: 300 mg via SUBCUTANEOUS

## 2012-09-09 ENCOUNTER — Ambulatory Visit (INDEPENDENT_AMBULATORY_CARE_PROVIDER_SITE_OTHER): Payer: Medicare Other

## 2012-09-09 DIAGNOSIS — J45909 Unspecified asthma, uncomplicated: Secondary | ICD-10-CM

## 2012-09-09 MED ORDER — OMALIZUMAB 150 MG ~~LOC~~ SOLR
300.0000 mg | Freq: Once | SUBCUTANEOUS | Status: AC
  Administered 2012-09-09: 300 mg via SUBCUTANEOUS

## 2012-09-14 DIAGNOSIS — Z23 Encounter for immunization: Secondary | ICD-10-CM | POA: Diagnosis not present

## 2012-09-16 DIAGNOSIS — L97509 Non-pressure chronic ulcer of other part of unspecified foot with unspecified severity: Secondary | ICD-10-CM | POA: Diagnosis not present

## 2012-09-16 DIAGNOSIS — M1710 Unilateral primary osteoarthritis, unspecified knee: Secondary | ICD-10-CM | POA: Diagnosis not present

## 2012-09-16 DIAGNOSIS — M25569 Pain in unspecified knee: Secondary | ICD-10-CM | POA: Diagnosis not present

## 2012-09-16 DIAGNOSIS — M205X9 Other deformities of toe(s) (acquired), unspecified foot: Secondary | ICD-10-CM | POA: Diagnosis not present

## 2012-09-17 ENCOUNTER — Encounter (HOSPITAL_COMMUNITY): Payer: Self-pay | Admitting: Pharmacy Technician

## 2012-09-21 ENCOUNTER — Telehealth: Payer: Self-pay | Admitting: Critical Care Medicine

## 2012-09-21 NOTE — Telephone Encounter (Signed)
Dr.Perini gave [name] the new pna shot. [name] had a local reaction,her arm is still sore,red & swollen. She wants to know if she needs to come in and let someone look at her arm? She will be close by. Pt. Is due for her xolair shot Thurs.(09/23/12) is it o.k. For her to take it? She also wanted you to know she is having knee surgery 10/05/2012. [name] has a class this afternoon, she will be there for most of the afternoon. CB [phone]

## 2012-09-21 NOTE — Telephone Encounter (Signed)
[id number] x1 

## 2012-09-21 NOTE — Telephone Encounter (Signed)
Dr. Delford Field,  Pt is also scheduled for xolair injection on this Thursday, May 15.  She would like to know if it is safe to get the xolair injection this Thursday given the reaction she is having from the pna injection.  Please advise.  Thank you.

## 2012-09-21 NOTE — Telephone Encounter (Signed)
She needs the PCP to look at this since he gave the injection

## 2012-09-21 NOTE — Telephone Encounter (Signed)
I spoke with pt and is aware. Nothing further was needed 

## 2012-09-21 NOTE — Telephone Encounter (Signed)
Pt returned triage's nurse.  [name]

## 2012-09-21 NOTE — Telephone Encounter (Signed)
Delay xolair by Luberta Mutter

## 2012-09-22 DIAGNOSIS — T7840XA Allergy, unspecified, initial encounter: Secondary | ICD-10-CM | POA: Diagnosis not present

## 2012-09-22 DIAGNOSIS — Z6826 Body mass index (BMI) 26.0-26.9, adult: Secondary | ICD-10-CM | POA: Diagnosis not present

## 2012-09-23 ENCOUNTER — Ambulatory Visit: Payer: Medicare Other

## 2012-09-27 ENCOUNTER — Encounter: Payer: Self-pay | Admitting: Internal Medicine

## 2012-09-27 ENCOUNTER — Encounter (HOSPITAL_COMMUNITY)
Admission: RE | Admit: 2012-09-27 | Discharge: 2012-09-27 | Disposition: A | Discharge status: Home / Self Care | Payer: Medicare Other | Source: Ambulatory Visit | Attending: Orthopedic Surgery | Admitting: Orthopedic Surgery

## 2012-09-27 ENCOUNTER — Encounter (HOSPITAL_COMMUNITY): Payer: Self-pay

## 2012-09-27 DIAGNOSIS — Z01812 Encounter for preprocedural laboratory examination: Secondary | ICD-10-CM | POA: Diagnosis not present

## 2012-09-27 DIAGNOSIS — J45909 Unspecified asthma, uncomplicated: Secondary | ICD-10-CM | POA: Diagnosis not present

## 2012-09-27 DIAGNOSIS — Z853 Personal history of malignant neoplasm of breast: Secondary | ICD-10-CM | POA: Diagnosis not present

## 2012-09-27 DIAGNOSIS — E663 Overweight: Secondary | ICD-10-CM | POA: Diagnosis present

## 2012-09-27 DIAGNOSIS — N183 Chronic kidney disease, stage 3 unspecified: Secondary | ICD-10-CM | POA: Diagnosis not present

## 2012-09-27 DIAGNOSIS — I129 Hypertensive chronic kidney disease with stage 1 through stage 4 chronic kidney disease, or unspecified chronic kidney disease: Secondary | ICD-10-CM | POA: Diagnosis present

## 2012-09-27 DIAGNOSIS — Z6827 Body mass index (BMI) 27.0-27.9, adult: Secondary | ICD-10-CM | POA: Diagnosis not present

## 2012-09-27 DIAGNOSIS — D62 Acute posthemorrhagic anemia: Secondary | ICD-10-CM | POA: Diagnosis not present

## 2012-09-27 DIAGNOSIS — I10 Essential (primary) hypertension: Secondary | ICD-10-CM | POA: Diagnosis not present

## 2012-09-27 DIAGNOSIS — M898X9 Other specified disorders of bone, unspecified site: Secondary | ICD-10-CM | POA: Diagnosis not present

## 2012-09-27 DIAGNOSIS — G2581 Restless legs syndrome: Secondary | ICD-10-CM | POA: Diagnosis present

## 2012-09-27 DIAGNOSIS — E785 Hyperlipidemia, unspecified: Secondary | ICD-10-CM | POA: Diagnosis present

## 2012-09-27 DIAGNOSIS — M810 Age-related osteoporosis without current pathological fracture: Secondary | ICD-10-CM | POA: Diagnosis present

## 2012-09-27 DIAGNOSIS — M25469 Effusion, unspecified knee: Secondary | ICD-10-CM | POA: Diagnosis present

## 2012-09-27 DIAGNOSIS — K219 Gastro-esophageal reflux disease without esophagitis: Secondary | ICD-10-CM | POA: Diagnosis present

## 2012-09-27 DIAGNOSIS — M659 Synovitis and tenosynovitis, unspecified: Secondary | ICD-10-CM | POA: Diagnosis present

## 2012-09-27 DIAGNOSIS — K828 Other specified diseases of gallbladder: Secondary | ICD-10-CM | POA: Diagnosis not present

## 2012-09-27 DIAGNOSIS — M1710 Unilateral primary osteoarthritis, unspecified knee: Secondary | ICD-10-CM | POA: Diagnosis not present

## 2012-09-27 DIAGNOSIS — M25569 Pain in unspecified knee: Secondary | ICD-10-CM | POA: Diagnosis not present

## 2012-09-27 DIAGNOSIS — J449 Chronic obstructive pulmonary disease, unspecified: Secondary | ICD-10-CM | POA: Diagnosis present

## 2012-09-27 HISTORY — DX: Pneumonia, unspecified organism: J18.9

## 2012-09-27 LAB — BASIC METABOLIC PANEL
BUN: 25 mg/dL — ABNORMAL HIGH (ref 6–23)
CO2: 34 mEq/L — ABNORMAL HIGH (ref 19–32)
Chloride: 99 mEq/L (ref 96–112)
GFR calc Af Amer: 41 mL/min — ABNORMAL LOW (ref >90)
Potassium: 4 mEq/L (ref 3.5–5.1)

## 2012-09-27 LAB — CBC
HCT: 38 % (ref 36.0–46.0)
Hemoglobin: 12.3 g/dL (ref 12.0–15.0)
MCV: 91.1 fL (ref 78.0–100.0)
WBC: 5.2 10*3/uL (ref 4.0–10.5)

## 2012-09-27 LAB — SURGICAL PCR SCREEN: Staphylococcus aureus: POSITIVE — AB

## 2012-09-27 LAB — PROTIME-INR: INR: 0.88 (ref 0.00–1.49)

## 2012-09-27 LAB — URINALYSIS, ROUTINE W REFLEX MICROSCOPIC
Glucose, UA: NEGATIVE mg/dL
Nitrite: NEGATIVE
Specific Gravity, Urine: 1.008 (ref 1.005–1.030)
pH: 6.5 (ref 5.0–8.0)

## 2012-09-27 NOTE — Progress Notes (Signed)
EKG 11/19/11 on [facility], Chest x-ray 08/12/12 in chart, surgery clearance note Dr. Waynard Edwards in chart

## 2012-09-27 NOTE — Patient Instructions (Addendum)
20 Leeza Mcgourty  09/27/2012   Your procedure is scheduled on: 10/05/12  Report to [facility] at 2:35 PM.  Call this number if you have problems the morning of surgery [phone]: [phone]   Remember: please bring inhaler   Do not eat food After Midnight, clear liquids from midnight until 1135 am on 10/05/12 then nothing.      Take these medicines the morning of surgery with A SIP OF WATER: alprazolam if needed, nexium, fexofenadine, inhaler if needed   Do not wear jewelry, make-up or nail polish.  Do not wear lotions, powders, or perfumes. You may wear deodorant.  Do not shave 48 hours prior to surgery. Men may shave face and neck.  Do not bring valuables to the hospital.  Contacts, dentures or bridgework may not be worn into surgery.  Leave suitcase in the car. After surgery it may be brought to your room.  For patients admitted to the hospital, checkout time is 11:00 AM the day of discharge.    Please read over the following fact sheets that you were given: MRSA Information, incentive spirometry fact sheet, clear liquids fact sheet, blood fact sheet Birdie Sons, RN  pre op nurse call if needed [phone]    FAILURE TO FOLLOW THESE INSTRUCTIONS MAY RESULT IN CANCELLATION OF YOUR SURGERY   Patient Signature: ___________________________________________

## 2012-09-29 ENCOUNTER — Ambulatory Visit (INDEPENDENT_AMBULATORY_CARE_PROVIDER_SITE_OTHER): Payer: Medicare Other

## 2012-09-29 ENCOUNTER — Ambulatory Visit (INDEPENDENT_AMBULATORY_CARE_PROVIDER_SITE_OTHER): Payer: Medicare Other | Admitting: Critical Care Medicine

## 2012-09-29 ENCOUNTER — Encounter: Payer: Self-pay | Admitting: Critical Care Medicine

## 2012-09-29 VITALS — BP 112/74 | HR 82 | Temp 97.4°F | Ht 64.0 in | Wt 158.8 lb

## 2012-09-29 DIAGNOSIS — J45909 Unspecified asthma, uncomplicated: Secondary | ICD-10-CM

## 2012-09-29 MED ORDER — OMALIZUMAB 150 MG ~~LOC~~ SOLR
300.0000 mg | Freq: Once | SUBCUTANEOUS | Status: AC
  Administered 2012-09-29: 300 mg via SUBCUTANEOUS

## 2012-09-29 NOTE — Patient Instructions (Addendum)
No change in medications. Return in        6 weeks  

## 2012-09-29 NOTE — Progress Notes (Signed)
Subjective:    Patient ID: [name], female    DOB: [birth year], 79 y.o.   MRN: [id number]  HPI 79 y.o. Moderate persistent asthma with severe atopy  09/29/2012 Dr Charlann Boxer to do TKR R knee 5/27.  No MRSA on nasal swab. Since march , occ cough fits.  No wheeze. No symptoms of dyspnea.     PUL ASTHMA HISTORY 09/29/2012 07/22/2012 07/21/2012 08/30/2010  Symptoms 0-2 days/week Daily Daily Throughout the day  Nighttime awakenings 0-2/month 3-4/month 3-4/month >1/wk but not nightly  Interference with activity Minor limitations Some limitations Some limitations Some limitations  SABA use 0-2 days/wk Daily Daily Daily  Exacerbations requiring oral steroids 0-1 / year 2 or more / year - 0-1 / year      Past Medical History  Diagnosis Date  . Restless legs syndrome (RLS)   . Osteoporosis, unspecified     Knee and hip osteoarthritis bilaterally  . Unspecified essential hypertension   . Other diseases of vocal cords   . Unspecified asthma   . Esophageal reflux   . Allergic rhinitis, cause unspecified   . Obstructive chronic bronchitis without exacerbation   . Hyperlipidemia   . Gallbladder disease   . Degenerative disk disease   . Perennial allergic rhinitis   . Diverticulosis   . Lymphedema     in the right arm  . Osteopenia   . Shingles 2007  . Chronic kidney disease, stage II (mild)   . Dysrhythmia     "skips a beat sometimes"  . Pneumonia     x2  . Cancer     right breast  . Blood clot in vein     behind right knee     Family History  Problem Relation Age of Onset  . Heart attack Mother   . Heart attack Father   . Cancer Brother   . Asthma Brother   . Coronary artery disease Sister   . Asthma Mother   . Heart failure Father      History   Social History  . Marital Status: Married    Spouse Name: N/A    Number of Children: N/A  . Years of Education: N/A   Occupational History  . Not on file.   Social History Main Topics  . Smoking status: Never Smoker    . Smokeless tobacco: Never Used  . Alcohol Use: No  . Drug Use: No  . Sexually Active: Not on file   Other Topics Concern  . Not on file   Social History Narrative  . No narrative on file     Allergies  Allergen Reactions  . Cephalosporins     REACTION: rash  . Codeine     REACTION: rash  . Eggs Or Egg-Derived Products   . Levofloxacin     REACTION: rash  . Penicillins     REACTION: rash  . Pneumococcal Vaccines Rash     Outpatient Prescriptions Prior to Visit  Medication Sig Dispense Refill  . albuterol (PROVENTIL HFA;VENTOLIN HFA) 108 (90 BASE) MCG/ACT inhaler Inhale 2 puffs into the lungs every 6 (six) hours as needed for wheezing.      [name] ALPRAZolam (XANAX) 0.5 MG tablet Take 0.5 mg by mouth 2 (two) times daily as needed for anxiety.       [name] azelastine (ASTELIN) 137 MCG/SPRAY nasal spray Place 1 spray into the nose 2 (two) times daily. Use in each nostril as directed      . benzonatate (TESSALON) 100 MG  capsule Take 100 mg by mouth 3 (three) times daily as needed for cough.      . budesonide-formoterol (SYMBICORT) 160-4.5 MCG/ACT inhaler Inhale 2 puffs into the lungs 2 (two) times daily.      . Calcium Carbonate-Vitamin D (CALTRATE 600+D) 600-400 MG-UNIT per tablet Take 1 tablet by mouth daily.       . chlorpheniramine (CHLOR-TRIMETON) 4 MG tablet Take 4 mg by mouth at bedtime.       . Cholecalciferol (VITAMIN D3) 1000 UNITS tablet Take 1,000 Units by mouth daily.        [name] dextromethorphan (DELSYM) 30 MG/5ML liquid Take 60 mg by mouth 2 (two) times daily as needed for cough.      . diclofenac sodium (VOLTAREN) 1 % GEL Apply 2 g topically daily as needed (pain).      [name] esomeprazole (NEXIUM) 40 MG capsule Take 40 mg by mouth 2 (two) times daily.      . fexofenadine (ALLEGRA) 180 MG tablet Take 180 mg by mouth daily.        . furosemide (LASIX) 40 MG tablet Take 40 mg by mouth every morning.       . ibandronate (BONIVA) 150 MG tablet Take 150 mg by mouth every 30 (thirty)  days. Take in the morning with a full glass of water, on an empty stomach, and do not take anything else by mouth or lie down for the next 30 min.      . mometasone (NASONEX) 50 MCG/ACT nasal spray Place 1 spray into the nose daily.       . montelukast (SINGULAIR) 10 MG tablet Take 10 mg by mouth at bedtime.      [name] omalizumab (XOLAIR) 150 MG injection Inject 150 mg into the skin every 14 (fourteen) days.       . potassium chloride (K-DUR,KLOR-CON) 10 MEQ tablet Take 20 mEq by mouth daily. Two tablets once dail      . pramipexole (MIRAPEX) 0.125 MG tablet Take 0.375 mg by mouth at bedtime.       . rosuvastatin (CRESTOR) 5 MG tablet Take 5 mg by mouth at bedtime.      [name] Spacer/Aero-Holding Chambers (AEROCHAMBER MV) inhaler by Other route. Use as instructed       . telmisartan (MICARDIS) 40 MG tablet Take 20 mg by mouth at bedtime.      . traMADol (ULTRAM) 50 MG tablet Take 50-100 mg by mouth at bedtime as needed for pain.      [name] trolamine salicylate (ASPERCREME) 10 % cream Apply 1 application topically 2 (two) times daily as needed (pain).       . vitamin B-12 (CYANOCOBALAMIN) 500 MCG tablet Take 500 mcg by mouth daily.        [name] aspirin 81 MG tablet ON HOLD for knee surgery       No facility-administered medications prior to visit.      Review of Systems  .Constitutional:   No  weight loss, night sweats,  Fevers, chills,  + fatigue, or  lassitude.  HEENT:   No headaches,  Difficulty swallowing,  Tooth/dental problems, or  Sore throat,                No sneezing, itching, ear ache,  +nasal congestion, post nasal drip,   CV:  No chest pain,  Orthopnea, PND, swelling in lower extremities, anasarca, dizziness, palpitations, syncope.   GI  No heartburn, indigestion, abdominal pain, nausea, vomiting, diarrhea, change in bowel habits,  loss of appetite, bloody stools.   Resp:    No coughing up of blood.    No chest wall deformity  Skin: no rash or lesions.  GU: no dysuria, change in color  of urine, no urgency or frequency.  No flank pain, no hematuria   MS:  No joint pain or swelling.  No decreased range of motion.  No back pain.  Psych:  No change in mood or affect. No depression or anxiety.  No memory loss.         Objective:   Physical Exam BP 112/74  Pulse 82  Temp(Src) 97.4 F (36.3 C) (Oral)  Ht 5\' 4"  (1.626 m)  Wt 158 lb 12.8 oz (72.031 kg)  BMI 27.24 kg/m2  SpO2 100%  Gen: Pleasant, well-nourished, in no distress,  normal affect  ENT: No lesions,  mouth clear,  oropharynx clear, + postnasal drip  Neck: No JVD, no TMG, no carotid bruits  Lungs: No use of accessory muscles, no dullness to percussion, distant BS, exp wheeze  Cardiovascular: RRR, heart sounds normal, no murmur or gallops, no peripheral edema  Abdomen: soft and NT, no HSM,  BS normal  Musculoskeletal: No deformities, no cyanosis or clubbing  Neuro: alert, non focal  Skin: Warm, no lesions or rashes       Assessment & Plan:   Severe persistent asthma Severe persistent asthma with allergic rhinitis stable at this time  This patient can be cleared for planned orthopedic procedure with total knee replacement on the right knee Continue current inhaled medications    Updated Medication List Outpatient Encounter Prescriptions as of 09/29/2012  Medication Sig Dispense Refill  . albuterol (PROVENTIL HFA;VENTOLIN HFA) 108 (90 BASE) MCG/ACT inhaler Inhale 2 puffs into the lungs every 6 (six) hours as needed for wheezing.      [name] ALPRAZolam (XANAX) 0.5 MG tablet Take 0.5 mg by mouth 2 (two) times daily as needed for anxiety.       [name] azelastine (ASTELIN) 137 MCG/SPRAY nasal spray Place 1 spray into the nose 2 (two) times daily. Use in each nostril as directed      . benzonatate (TESSALON) 100 MG capsule Take 100 mg by mouth 3 (three) times daily as needed for cough.      . budesonide-formoterol (SYMBICORT) 160-4.5 MCG/ACT inhaler Inhale 2 puffs into the lungs 2 (two) times daily.       . Calcium Carbonate-Vitamin D (CALTRATE 600+D) 600-400 MG-UNIT per tablet Take 1 tablet by mouth daily.       . chlorpheniramine (CHLOR-TRIMETON) 4 MG tablet Take 4 mg by mouth at bedtime.       . Cholecalciferol (VITAMIN D3) 1000 UNITS tablet Take 1,000 Units by mouth daily.        [name] dextromethorphan (DELSYM) 30 MG/5ML liquid Take 60 mg by mouth 2 (two) times daily as needed for cough.      . diclofenac sodium (VOLTAREN) 1 % GEL Apply 2 g topically daily as needed (pain).      [name] esomeprazole (NEXIUM) 40 MG capsule Take 40 mg by mouth 2 (two) times daily.      . fexofenadine (ALLEGRA) 180 MG tablet Take 180 mg by mouth daily.        . furosemide (LASIX) 40 MG tablet Take 40 mg by mouth every morning.       . ibandronate (BONIVA) 150 MG tablet Take 150 mg by mouth every 30 (thirty) days. Take in the morning with a full glass of  water, on an empty stomach, and do not take anything else by mouth or lie down for the next 30 min.      . mometasone (NASONEX) 50 MCG/ACT nasal spray Place 1 spray into the nose daily.       . montelukast (SINGULAIR) 10 MG tablet Take 10 mg by mouth at bedtime.      . mupirocin ointment (BACTROBAN) 2 % Apply topically 2 (two) times daily. In nostrils      . omalizumab (XOLAIR) 150 MG injection Inject 150 mg into the skin every 14 (fourteen) days.       . potassium chloride (K-DUR,KLOR-CON) 10 MEQ tablet Take 20 mEq by mouth daily. Two tablets once dail      . pramipexole (MIRAPEX) 0.125 MG tablet Take 0.375 mg by mouth at bedtime.       . rosuvastatin (CRESTOR) 5 MG tablet Take 5 mg by mouth at bedtime.      [name] Spacer/Aero-Holding Chambers (AEROCHAMBER MV) inhaler by Other route. Use as instructed       . telmisartan (MICARDIS) 40 MG tablet Take 20 mg by mouth at bedtime.      . traMADol (ULTRAM) 50 MG tablet Take 50-100 mg by mouth at bedtime as needed for pain.      [name] trolamine salicylate (ASPERCREME) 10 % cream Apply 1 application topically 2 (two) times daily as needed  (pain).       . vitamin B-12 (CYANOCOBALAMIN) 500 MCG tablet Take 500 mcg by mouth daily.        [name] aspirin 81 MG tablet ON HOLD for knee surgery       No facility-administered encounter medications on file as of 09/29/2012.

## 2012-09-30 NOTE — Assessment & Plan Note (Signed)
Severe persistent asthma with allergic rhinitis stable at this time  This patient can be cleared for planned orthopedic procedure with total knee replacement on the right knee Continue current inhaled medications

## 2012-10-03 NOTE — H&P (Signed)
TOTAL KNEE ADMISSION H&P  Patient is being admitted for right total knee arthroplasty.  Subjective:  Chief Complaint:right knee OA / pain.  HPI: [name], 79 y.o. female, has a history of pain and functional disability in the right knee due to arthritis and has failed non-surgical conservative treatments for greater than 12 weeks to includeNSAID's and/or analgesics, corticosteriod injections and activity modification.  Onset of symptoms was gradual, starting >10 years ago with gradually worsening course since that time. The patient noted no past surgery on the right knee(s).  Patient currently rates pain in the right knee(s) at 10 out of 10 with activity. Patient has night pain, worsening of pain with activity and weight bearing, pain that interferes with activities of daily living, pain with passive range of motion, crepitus and joint swelling.  Patient has evidence of subchondral sclerosis, periarticular osteophytes and joint space narrowing by imaging studies. There is no active infection. Risks, benefits and expectations were discussed with the patient. Patient understand the risks, benefits and expectations and wishes to proceed with surgery.   D/C Plans:   Home with HHPT  Post-op Meds:   No Rx given  Tranexamic Acid:   Not to be given - Previous DVT  Decadron:    To be given  FYI:    Xarelto for 2 weeks and then ASA for 4   Patient Active Problem List   Diagnosis Date Noted  . DJD (degenerative joint disease) 05/26/2012  . Chest pressure 05/19/2011  . NONORGANIC SLEEP DISORDER UNSPECIFIED 10/03/2008  . Severe persistent asthma 08/05/2007  . RESTLESS LEGS SYNDROME 04/30/2007  . HYPERTENSION 04/30/2007  . VOCAL CORD DISORDER 04/30/2007  . OSTEOPOROSIS 04/30/2007  . ALLERGIC RHINITIS 01/13/2007  . GERD 01/13/2007   Past Medical History  Diagnosis Date  . Restless legs syndrome (RLS)   . Osteoporosis, unspecified     Knee and hip osteoarthritis bilaterally  .  Unspecified essential hypertension   . Other diseases of vocal cords   . Unspecified asthma   . Esophageal reflux   . Allergic rhinitis, cause unspecified   . Obstructive chronic bronchitis without exacerbation   . Hyperlipidemia   . Gallbladder disease   . Degenerative disk disease   . Perennial allergic rhinitis   . Diverticulosis   . Lymphedema     in the right arm  . Osteopenia   . Shingles 2007  . Chronic kidney disease, stage II (mild)   . Dysrhythmia     "skips a beat sometimes"  . Pneumonia     x2  . Cancer     right breast  . Blood clot in vein     behind right knee    Past Surgical History  Procedure Laterality Date  . Cardiac catheterization  08/012008    normal -- EF of 65%  . Mastectomy  1989    right  . Mastectomy  1990    left  . Back surgery  1983    lumbar procedure  . Back surgery  1981    lumbar procedure  . Total abdominal hysterectomy w/ bilateral salpingoophorectomy    . Cholecystectomy  1983  . Cataract extraction Bilateral     No prescriptions prior to admission   Allergies  Allergen Reactions  . Cephalosporins     REACTION: rash  . Codeine     REACTION: rash  . Eggs Or Egg-Derived Products   . Levofloxacin     REACTION: rash  . Penicillins     REACTION:  rash  . Pneumococcal Vaccines Rash    History  Substance Use Topics  . Smoking status: Never Smoker   . Smokeless tobacco: Never Used  . Alcohol Use: No    Family History  Problem Relation Age of Onset  . Heart attack Mother   . Heart attack Father   . Cancer Brother   . Asthma Brother   . Coronary artery disease Sister   . Asthma Mother   . Heart failure Father      Review of Systems  Constitutional: Negative.   HENT: Negative.   Eyes: Negative.   Respiratory: Positive for cough.   Cardiovascular: Negative.   Gastrointestinal: Positive for heartburn.  Genitourinary: Negative.   Musculoskeletal: Positive for joint pain.  Skin: Negative.   Neurological:  Negative.   Endo/Heme/Allergies: Positive for environmental allergies.  Psychiatric/Behavioral: Negative.     Objective:  Physical Exam  Constitutional: She is oriented to person, place, and time. She appears well-developed and well-nourished.  HENT:  Head: Normocephalic and atraumatic.  Mouth/Throat: Oropharynx is clear and moist. She has dentures.  Eyes: Pupils are equal, round, and reactive to light.  Neck: Neck supple. No JVD present. No tracheal deviation present. No thyromegaly present.  Cardiovascular: Normal rate, regular rhythm, normal heart sounds and intact distal pulses.   Respiratory: Effort normal and breath sounds normal. No stridor. No respiratory distress. She has no wheezes.  GI: Soft. There is no tenderness. There is no guarding.  Musculoskeletal:       Right knee: She exhibits decreased range of motion, swelling and bony tenderness. She exhibits no ecchymosis, no laceration and no erythema. Tenderness found.  Lymphadenopathy:    She has no cervical adenopathy.  Neurological: She is alert and oriented to person, place, and time.  Skin: Skin is warm and dry.  Psychiatric: She has a normal mood and affect.     Labs:  Estimated body mass index is 26.66 kg/(m^2) as calculated from the following:   Height as of 07/21/12: 5\' 5"  (1.651 m).   Weight as of 07/21/12: 72.666 kg (160 lb 3.2 oz).   Imaging Review Plain radiographs demonstrate severe degenerative joint disease of the right knee(s). The overall alignment is neutral. The bone quality appears to be good for age and reported activity level.  Assessment/Plan:  End stage arthritis, right knee   The patient history, physical examination, clinical judgment of the provider and imaging studies are consistent with end stage degenerative joint disease of the right knee(s) and total knee arthroplasty is deemed medically necessary. The treatment options including medical management, injection therapy arthroscopy and  arthroplasty were discussed at length. The risks and benefits of total knee arthroplasty were presented and reviewed. The risks due to aseptic loosening, infection, stiffness, patella tracking problems, thromboembolic complications and other imponderables were discussed. The patient acknowledged the explanation, agreed to proceed with the plan and consent was signed. Patient is being admitted for inpatient treatment for surgery, pain control, PT, OT, prophylactic antibiotics, VTE prophylaxis, progressive ambulation and ADL's and discharge planning. The patient is planning to be discharged home with home health services.   Anastasio Auerbach Blanchard [place]   PAC  10/03/2012, 10:11 AM

## 2012-10-05 ENCOUNTER — Inpatient Hospital Stay (HOSPITAL_COMMUNITY)
Admission: RE | Admit: 2012-10-05 | Discharge: 2012-10-07 | DRG: 470 | Disposition: A | Discharge status: Home Health | Payer: Medicare Other | Source: Ambulatory Visit | Attending: Orthopedic Surgery | Admitting: Orthopedic Surgery

## 2012-10-05 ENCOUNTER — Inpatient Hospital Stay (HOSPITAL_COMMUNITY): Payer: Medicare Other | Admitting: Anesthesiology

## 2012-10-05 ENCOUNTER — Encounter (HOSPITAL_COMMUNITY): Payer: Self-pay | Admitting: Anesthesiology

## 2012-10-05 ENCOUNTER — Encounter (HOSPITAL_COMMUNITY): Payer: Self-pay | Admitting: *Deleted

## 2012-10-05 ENCOUNTER — Encounter (HOSPITAL_COMMUNITY)
Admission: RE | Disposition: A | Discharge status: Home Health | Payer: Self-pay | Source: Ambulatory Visit | Attending: Orthopedic Surgery

## 2012-10-05 DIAGNOSIS — G2581 Restless legs syndrome: Secondary | ICD-10-CM | POA: Diagnosis present

## 2012-10-05 DIAGNOSIS — Z853 Personal history of malignant neoplasm of breast: Secondary | ICD-10-CM

## 2012-10-05 DIAGNOSIS — J449 Chronic obstructive pulmonary disease, unspecified: Secondary | ICD-10-CM | POA: Diagnosis present

## 2012-10-05 DIAGNOSIS — M659 Unspecified synovitis and tenosynovitis, unspecified site: Secondary | ICD-10-CM | POA: Diagnosis present

## 2012-10-05 DIAGNOSIS — Z96651 Presence of right artificial knee joint: Secondary | ICD-10-CM

## 2012-10-05 DIAGNOSIS — E785 Hyperlipidemia, unspecified: Secondary | ICD-10-CM | POA: Diagnosis present

## 2012-10-05 DIAGNOSIS — D500 Iron deficiency anemia secondary to blood loss (chronic): Secondary | ICD-10-CM

## 2012-10-05 DIAGNOSIS — I129 Hypertensive chronic kidney disease with stage 1 through stage 4 chronic kidney disease, or unspecified chronic kidney disease: Secondary | ICD-10-CM | POA: Diagnosis present

## 2012-10-05 DIAGNOSIS — M25469 Effusion, unspecified knee: Secondary | ICD-10-CM | POA: Diagnosis present

## 2012-10-05 DIAGNOSIS — N183 Chronic kidney disease, stage 3 unspecified: Secondary | ICD-10-CM | POA: Diagnosis present

## 2012-10-05 DIAGNOSIS — Z01812 Encounter for preprocedural laboratory examination: Secondary | ICD-10-CM

## 2012-10-05 DIAGNOSIS — M898X9 Other specified disorders of bone, unspecified site: Secondary | ICD-10-CM | POA: Diagnosis present

## 2012-10-05 DIAGNOSIS — J4489 Other specified chronic obstructive pulmonary disease: Secondary | ICD-10-CM | POA: Diagnosis present

## 2012-10-05 DIAGNOSIS — M810 Age-related osteoporosis without current pathological fracture: Secondary | ICD-10-CM | POA: Diagnosis present

## 2012-10-05 DIAGNOSIS — M1710 Unilateral primary osteoarthritis, unspecified knee: Principal | ICD-10-CM | POA: Diagnosis present

## 2012-10-05 DIAGNOSIS — E663 Overweight: Secondary | ICD-10-CM | POA: Diagnosis present

## 2012-10-05 DIAGNOSIS — K219 Gastro-esophageal reflux disease without esophagitis: Secondary | ICD-10-CM | POA: Diagnosis present

## 2012-10-05 DIAGNOSIS — Z6827 Body mass index (BMI) 27.0-27.9, adult: Secondary | ICD-10-CM

## 2012-10-05 DIAGNOSIS — D62 Acute posthemorrhagic anemia: Secondary | ICD-10-CM | POA: Diagnosis not present

## 2012-10-05 HISTORY — PX: TOTAL KNEE ARTHROPLASTY: SHX125

## 2012-10-05 LAB — TYPE AND SCREEN
ABO/RH(D): O POS
Antibody Screen: NEGATIVE

## 2012-10-05 SURGERY — ARTHROPLASTY, KNEE, TOTAL
Anesthesia: Spinal | Site: Knee | Laterality: Right | Wound class: Clean

## 2012-10-05 MED ORDER — ONDANSETRON HCL 4 MG PO TABS
4.0000 mg | ORAL_TABLET | Freq: Four times a day (QID) | ORAL | Status: DC | PRN
  Administered 2012-10-06 – 2012-10-07 (×2): 4 mg via ORAL
  Filled 2012-10-05 (×2): qty 1

## 2012-10-05 MED ORDER — METOCLOPRAMIDE HCL 10 MG PO TABS: 5.0000 mg | ORAL_TABLET | Freq: Three times a day (TID) | ORAL | Status: DC | PRN

## 2012-10-05 MED ORDER — PANTOPRAZOLE SODIUM 40 MG PO TBEC
80.0000 mg | DELAYED_RELEASE_TABLET | Freq: Every day | ORAL | Status: DC
  Administered 2012-10-06: 80 mg via ORAL
  Filled 2012-10-05 (×2): qty 2

## 2012-10-05 MED ORDER — KETOROLAC TROMETHAMINE 30 MG/ML IJ SOLN
INTRAMUSCULAR | Status: DC | PRN
  Administered 2012-10-05: 30 mg

## 2012-10-05 MED ORDER — CELECOXIB 200 MG PO CAPS
200.0000 mg | ORAL_CAPSULE | Freq: Two times a day (BID) | ORAL | Status: DC
  Administered 2012-10-05 – 2012-10-07 (×4): 200 mg via ORAL
  Filled 2012-10-05 (×5): qty 1

## 2012-10-05 MED ORDER — ONDANSETRON HCL 4 MG/2ML IJ SOLN: 4.0000 mg | Freq: Four times a day (QID) | INTRAMUSCULAR | Status: DC | PRN

## 2012-10-05 MED ORDER — BUPIVACAINE LIPOSOME 1.3 % IJ SUSP
20.0000 mL | Freq: Once | INTRAMUSCULAR | Status: DC
  Filled 2012-10-05: qty 20

## 2012-10-05 MED ORDER — HYDROMORPHONE HCL PF 1 MG/ML IJ SOLN
0.5000 mg | INTRAMUSCULAR | Status: DC | PRN
Start: 2012-10-05 — End: 2012-10-07
  Administered 2012-10-05: 1 mg via INTRAVENOUS
  Filled 2012-10-05: qty 1

## 2012-10-05 MED ORDER — HYDROMORPHONE HCL PF 1 MG/ML IJ SOLN: 0.2500 mg | INTRAMUSCULAR | Status: DC | PRN

## 2012-10-05 MED ORDER — ATORVASTATIN CALCIUM 10 MG PO TABS
10.0000 mg | ORAL_TABLET | Freq: Every day | ORAL | Status: DC
  Administered 2012-10-05 – 2012-10-06 (×2): 10 mg via ORAL
  Filled 2012-10-05 (×4): qty 1

## 2012-10-05 MED ORDER — PRAMIPEXOLE DIHYDROCHLORIDE 0.25 MG PO TABS
0.3750 mg | ORAL_TABLET | Freq: Every day | ORAL | Status: DC
  Administered 2012-10-05 – 2012-10-06 (×2): 0.375 mg via ORAL
  Filled 2012-10-05 (×3): qty 1

## 2012-10-05 MED ORDER — ALPRAZOLAM 0.5 MG PO TABS: 0.5000 mg | ORAL_TABLET | Freq: Two times a day (BID) | ORAL | Status: DC | PRN

## 2012-10-05 MED ORDER — PROPOFOL INFUSION 10 MG/ML OPTIME
INTRAVENOUS | Status: DC | PRN
  Administered 2012-10-05: 75 ug/kg/min via INTRAVENOUS

## 2012-10-05 MED ORDER — AZELASTINE HCL 0.1 % NA SOLN
1.0000 | Freq: Two times a day (BID) | NASAL | Status: DC
  Administered 2012-10-05 – 2012-10-07 (×4): 1 via NASAL
  Filled 2012-10-05: qty 30

## 2012-10-05 MED ORDER — PHENYLEPHRINE HCL 10 MG/ML IJ SOLN
INTRAMUSCULAR | Status: DC | PRN
  Administered 2012-10-05 (×3): 80 ug via INTRAVENOUS

## 2012-10-05 MED ORDER — BISACODYL 10 MG RE SUPP: 10.0000 mg | Freq: Every day | RECTAL | Status: DC | PRN

## 2012-10-05 MED ORDER — METOCLOPRAMIDE HCL 5 MG/ML IJ SOLN: 5.0000 mg | Freq: Three times a day (TID) | INTRAMUSCULAR | Status: DC | PRN

## 2012-10-05 MED ORDER — ONDANSETRON HCL 4 MG/2ML IJ SOLN
INTRAMUSCULAR | Status: DC | PRN
  Administered 2012-10-05: 4 mg via INTRAVENOUS

## 2012-10-05 MED ORDER — ZOLPIDEM TARTRATE 5 MG PO TABS: 5.0000 mg | ORAL_TABLET | Freq: Every evening | ORAL | Status: DC | PRN

## 2012-10-05 MED ORDER — MONTELUKAST SODIUM 10 MG PO TABS
10.0000 mg | ORAL_TABLET | Freq: Every day | ORAL | Status: DC
  Administered 2012-10-05 – 2012-10-06 (×2): 10 mg via ORAL
  Filled 2012-10-05 (×3): qty 1

## 2012-10-05 MED ORDER — BUPIVACAINE LIPOSOME 1.3 % IJ SUSP
INTRAMUSCULAR | Status: DC | PRN
  Administered 2012-10-05: 20 mL

## 2012-10-05 MED ORDER — FERROUS SULFATE 325 (65 FE) MG PO TABS
325.0000 mg | ORAL_TABLET | Freq: Three times a day (TID) | ORAL | Status: DC
  Administered 2012-10-05 – 2012-10-07 (×5): 325 mg via ORAL
  Filled 2012-10-05 (×8): qty 1

## 2012-10-05 MED ORDER — SODIUM CHLORIDE 0.9 % IJ SOLN
INTRAMUSCULAR | Status: DC | PRN
  Administered 2012-10-05: 14 mL

## 2012-10-05 MED ORDER — POTASSIUM CHLORIDE CRYS ER 20 MEQ PO TBCR
20.0000 meq | EXTENDED_RELEASE_TABLET | Freq: Every day | ORAL | Status: DC
  Administered 2012-10-05 – 2012-10-06 (×2): 20 meq via ORAL
  Filled 2012-10-05 (×3): qty 1

## 2012-10-05 MED ORDER — ACETAMINOPHEN 10 MG/ML IV SOLN
INTRAVENOUS | Status: DC | PRN
  Administered 2012-10-05: 1000 mg via INTRAVENOUS

## 2012-10-05 MED ORDER — ALBUTEROL SULFATE HFA 108 (90 BASE) MCG/ACT IN AERS
2.0000 | INHALATION_SPRAY | Freq: Four times a day (QID) | RESPIRATORY_TRACT | Status: DC | PRN
  Filled 2012-10-05: qty 6.7

## 2012-10-05 MED ORDER — SODIUM CHLORIDE 0.9 % IR SOLN
Status: DC | PRN
  Administered 2012-10-05: 1000 mL

## 2012-10-05 MED ORDER — CLINDAMYCIN PHOSPHATE 600 MG/50ML IV SOLN
600.0000 mg | Freq: Four times a day (QID) | INTRAVENOUS | Status: AC
  Administered 2012-10-05 – 2012-10-06 (×2): 600 mg via INTRAVENOUS
  Filled 2012-10-05 (×2): qty 50

## 2012-10-05 MED ORDER — LORATADINE 10 MG PO TABS
10.0000 mg | ORAL_TABLET | Freq: Every day | ORAL | Status: DC
  Administered 2012-10-06 – 2012-10-07 (×2): 10 mg via ORAL
  Filled 2012-10-05 (×2): qty 1

## 2012-10-05 MED ORDER — PHENOL 1.4 % MT LIQD
1.0000 | OROMUCOSAL | Status: DC | PRN
  Filled 2012-10-05: qty 177

## 2012-10-05 MED ORDER — METHOCARBAMOL 100 MG/ML IJ SOLN
500.0000 mg | Freq: Four times a day (QID) | INTRAVENOUS | Status: DC | PRN
  Filled 2012-10-05: qty 5

## 2012-10-05 MED ORDER — FLEET ENEMA 7-19 GM/118ML RE ENEM: 1.0000 | ENEMA | Freq: Once | RECTAL | Status: AC | PRN

## 2012-10-05 MED ORDER — POLYETHYLENE GLYCOL 3350 17 G PO PACK
17.0000 g | PACK | Freq: Two times a day (BID) | ORAL | Status: DC
  Administered 2012-10-05 – 2012-10-07 (×4): 17 g via ORAL
  Filled 2012-10-05: qty 1

## 2012-10-05 MED ORDER — FUROSEMIDE 40 MG PO TABS
40.0000 mg | ORAL_TABLET | Freq: Every morning | ORAL | Status: DC
  Administered 2012-10-05 – 2012-10-07 (×3): 40 mg via ORAL
  Filled 2012-10-05 (×3): qty 1

## 2012-10-05 MED ORDER — FLUTICASONE PROPIONATE 50 MCG/ACT NA SUSP
1.0000 | Freq: Every day | NASAL | Status: DC
  Administered 2012-10-05 – 2012-10-07 (×3): 1 via NASAL
  Filled 2012-10-05: qty 16

## 2012-10-05 MED ORDER — KETOROLAC TROMETHAMINE 30 MG/ML IJ SOLN: 15.0000 mg | Freq: Once | INTRAMUSCULAR | Status: DC | PRN

## 2012-10-05 MED ORDER — BUDESONIDE-FORMOTEROL FUMARATE 160-4.5 MCG/ACT IN AERO
2.0000 | INHALATION_SPRAY | Freq: Two times a day (BID) | RESPIRATORY_TRACT | Status: DC
  Administered 2012-10-06 – 2012-10-07 (×3): 2 via RESPIRATORY_TRACT
  Filled 2012-10-05: qty 6

## 2012-10-05 MED ORDER — RIVAROXABAN 10 MG PO TABS
10.0000 mg | ORAL_TABLET | ORAL | Status: DC
  Administered 2012-10-06: 10 mg via ORAL
  Filled 2012-10-05 (×4): qty 1

## 2012-10-05 MED ORDER — HYDROCODONE-ACETAMINOPHEN 7.5-325 MG PO TABS
1.0000 | ORAL_TABLET | ORAL | Status: DC
  Administered 2012-10-05: 1 via ORAL
  Administered 2012-10-05 – 2012-10-07 (×9): 2 via ORAL
  Filled 2012-10-05 (×9): qty 2
  Filled 2012-10-05: qty 1

## 2012-10-05 MED ORDER — DOCUSATE SODIUM 100 MG PO CAPS
100.0000 mg | ORAL_CAPSULE | Freq: Two times a day (BID) | ORAL | Status: DC
  Administered 2012-10-05 – 2012-10-07 (×4): 100 mg via ORAL

## 2012-10-05 MED ORDER — PROMETHAZINE HCL 25 MG/ML IJ SOLN: 6.2500 mg | INTRAMUSCULAR | Status: DC | PRN

## 2012-10-05 MED ORDER — SODIUM CHLORIDE 0.9 % IV SOLN
INTRAVENOUS | Status: DC
  Administered 2012-10-05 – 2012-10-06 (×3): via INTRAVENOUS
  Filled 2012-10-05 (×7): qty 1000

## 2012-10-05 MED ORDER — BUPIVACAINE-EPINEPHRINE PF 0.25-1:200000 % IJ SOLN
INTRAMUSCULAR | Status: DC | PRN
  Administered 2012-10-05: 25 mL

## 2012-10-05 MED ORDER — ACETAMINOPHEN 40 MG HALF SUPP: RECTAL | Status: DC | PRN

## 2012-10-05 MED ORDER — BUPIVACAINE HCL (PF) 0.75 % IJ SOLN
INTRAMUSCULAR | Status: DC | PRN
  Administered 2012-10-05: 15 mg via INTRATHECAL

## 2012-10-05 MED ORDER — 0.9 % SODIUM CHLORIDE (POUR BTL) OPTIME
TOPICAL | Status: DC | PRN
  Administered 2012-10-05: 1000 mL

## 2012-10-05 MED ORDER — METHOCARBAMOL 500 MG PO TABS
500.0000 mg | ORAL_TABLET | Freq: Four times a day (QID) | ORAL | Status: DC | PRN
  Administered 2012-10-06: 500 mg via ORAL
  Filled 2012-10-05: qty 1

## 2012-10-05 MED ORDER — LACTATED RINGERS IV SOLN
INTRAVENOUS | Status: DC
  Administered 2012-10-05: 1000 mL via INTRAVENOUS

## 2012-10-05 MED ORDER — DEXAMETHASONE SODIUM PHOSPHATE 10 MG/ML IJ SOLN
10.0000 mg | Freq: Once | INTRAMUSCULAR | Status: AC
  Administered 2012-10-05: 10 mg via INTRAVENOUS

## 2012-10-05 MED ORDER — DEXAMETHASONE SODIUM PHOSPHATE 10 MG/ML IJ SOLN
10.0000 mg | Freq: Once | INTRAMUSCULAR | Status: AC
  Administered 2012-10-06: 10 mg via INTRAVENOUS
  Filled 2012-10-05: qty 1

## 2012-10-05 MED ORDER — BENZONATATE 100 MG PO CAPS: 100.0000 mg | ORAL_CAPSULE | Freq: Three times a day (TID) | ORAL | Status: DC | PRN

## 2012-10-05 MED ORDER — DEXTROMETHORPHAN POLISTIREX 30 MG/5ML PO LQCR
60.0000 mg | Freq: Two times a day (BID) | ORAL | Status: DC | PRN
  Filled 2012-10-05: qty 10

## 2012-10-05 MED ORDER — ALUM & MAG HYDROXIDE-SIMETH 200-200-20 MG/5ML PO SUSP: 30.0000 mL | ORAL | Status: DC | PRN

## 2012-10-05 MED ORDER — LACTATED RINGERS IV SOLN
INTRAVENOUS | Status: DC | PRN
  Administered 2012-10-05 (×3): via INTRAVENOUS

## 2012-10-05 MED ORDER — MENTHOL 3 MG MT LOZG
1.0000 | LOZENGE | OROMUCOSAL | Status: DC | PRN
  Filled 2012-10-05: qty 9

## 2012-10-05 MED ORDER — CLINDAMYCIN PHOSPHATE 900 MG/50ML IV SOLN
900.0000 mg | INTRAVENOUS | Status: AC
  Administered 2012-10-05: 900 mg via INTRAVENOUS

## 2012-10-05 MED ORDER — DIPHENHYDRAMINE HCL 25 MG PO CAPS: 25.0000 mg | ORAL_CAPSULE | Freq: Four times a day (QID) | ORAL | Status: DC | PRN

## 2012-10-05 MED ORDER — IRBESARTAN 75 MG PO TABS
75.0000 mg | ORAL_TABLET | Freq: Every day | ORAL | Status: DC
  Administered 2012-10-05 – 2012-10-07 (×3): 75 mg via ORAL
  Filled 2012-10-05 (×3): qty 1

## 2012-10-05 MED ORDER — MIDAZOLAM HCL 5 MG/5ML IJ SOLN
INTRAMUSCULAR | Status: DC | PRN
  Administered 2012-10-05: 2 mg via INTRAVENOUS

## 2012-10-05 SURGICAL SUPPLY — 58 items
BAG ZIPLOCK 12X15 (MISCELLANEOUS) ×2 IMPLANT
BANDAGE ELASTIC 6 VELCRO ST LF (GAUZE/BANDAGES/DRESSINGS) ×2 IMPLANT
BANDAGE ESMARK 6X9 LF (GAUZE/BANDAGES/DRESSINGS) ×1 IMPLANT
BLADE SAW SGTL 13.0X1.19X90.0M (BLADE) ×2 IMPLANT
BNDG ESMARK 6X9 LF (GAUZE/BANDAGES/DRESSINGS) ×2
BOWL SMART MIX CTS (DISPOSABLE) ×2 IMPLANT
CEMENT HV SMART SET (Cement) ×4 IMPLANT
CLOTH BEACON ORANGE TIMEOUT ST (SAFETY) ×2 IMPLANT
CUFF TOURN SGL QUICK 34 (TOURNIQUET CUFF) ×1
CUFF TRNQT CYL 34X4X40X1 (TOURNIQUET CUFF) ×1 IMPLANT
DECANTER SPIKE VIAL GLASS SM (MISCELLANEOUS) ×2 IMPLANT
DERMABOND ADVANCED (GAUZE/BANDAGES/DRESSINGS) ×1
DERMABOND ADVANCED .7 DNX12 (GAUZE/BANDAGES/DRESSINGS) ×1 IMPLANT
DRAPE EXTREMITY T 121X128X90 (DRAPE) ×2 IMPLANT
DRAPE POUCH INSTRU U-SHP 10X18 (DRAPES) ×2 IMPLANT
DRAPE U-SHAPE 47X51 STRL (DRAPES) ×2 IMPLANT
DRSG AQUACEL AG ADV 3.5X10 (GAUZE/BANDAGES/DRESSINGS) ×2 IMPLANT
DRSG TEGADERM 4X4.75 (GAUZE/BANDAGES/DRESSINGS) ×2 IMPLANT
DURAPREP 26ML APPLICATOR (WOUND CARE) ×2 IMPLANT
ELECT REM PT RETURN 9FT ADLT (ELECTROSURGICAL) ×2
ELECTRODE REM PT RTRN 9FT ADLT (ELECTROSURGICAL) ×1 IMPLANT
EVACUATOR 1/8 PVC DRAIN (DRAIN) ×2 IMPLANT
FACESHIELD LNG OPTICON STERILE (SAFETY) ×10 IMPLANT
GAUZE SPONGE 2X2 8PLY STRL LF (GAUZE/BANDAGES/DRESSINGS) ×1 IMPLANT
GLOVE BIOGEL PI IND STRL 7.0 (GLOVE) ×1 IMPLANT
GLOVE BIOGEL PI IND STRL 7.5 (GLOVE) ×3 IMPLANT
GLOVE BIOGEL PI IND STRL 8 (GLOVE) ×1 IMPLANT
GLOVE BIOGEL PI INDICATOR 7.0 (GLOVE) ×1
GLOVE BIOGEL PI INDICATOR 7.5 (GLOVE) ×3
GLOVE BIOGEL PI INDICATOR 8 (GLOVE) ×1
GLOVE ECLIPSE 8.0 STRL XLNG CF (GLOVE) ×2 IMPLANT
GLOVE ORTHO TXT STRL SZ7.5 (GLOVE) ×6 IMPLANT
GLOVE SURG SS PI 6.5 STRL IVOR (GLOVE) ×2 IMPLANT
GLOVE SURG SS PI 7.5 STRL IVOR (GLOVE) ×2 IMPLANT
GOWN BRE IMP PREV XXLGXLNG (GOWN DISPOSABLE) ×4 IMPLANT
GOWN STRL NON-REIN LRG LVL3 (GOWN DISPOSABLE) ×6 IMPLANT
HANDPIECE INTERPULSE COAX TIP (DISPOSABLE) ×1
KIT BASIN OR (CUSTOM PROCEDURE TRAY) ×2 IMPLANT
MANIFOLD NEPTUNE II (INSTRUMENTS) ×2 IMPLANT
NDL SAFETY ECLIPSE 18X1.5 (NEEDLE) ×1 IMPLANT
NEEDLE HYPO 18GX1.5 SHARP (NEEDLE) ×1
NS IRRIG 1000ML POUR BTL (IV SOLUTION) ×2 IMPLANT
PACK TOTAL JOINT (CUSTOM PROCEDURE TRAY) ×2 IMPLANT
POSITIONER SURGICAL ARM (MISCELLANEOUS) ×2 IMPLANT
SET HNDPC FAN SPRY TIP SCT (DISPOSABLE) ×1 IMPLANT
SET PAD KNEE POSITIONER (MISCELLANEOUS) ×2 IMPLANT
SPONGE GAUZE 2X2 STER 10/PKG (GAUZE/BANDAGES/DRESSINGS) ×1
SUCTION FRAZIER 12FR DISP (SUCTIONS) ×2 IMPLANT
SUT MNCRL AB 4-0 PS2 18 (SUTURE) ×2 IMPLANT
SUT VIC AB 1 CT1 36 (SUTURE) ×2 IMPLANT
SUT VIC AB 2-0 CT1 27 (SUTURE) ×3
SUT VIC AB 2-0 CT1 TAPERPNT 27 (SUTURE) ×3 IMPLANT
SUT VLOC 180 0 24IN GS25 (SUTURE) ×2 IMPLANT
SYR 50ML LL SCALE MARK (SYRINGE) ×2 IMPLANT
TOWEL OR 17X26 10 PK STRL BLUE (TOWEL DISPOSABLE) ×4 IMPLANT
TRAY FOLEY CATH 14FRSI W/METER (CATHETERS) ×2 IMPLANT
WATER STERILE IRR 1500ML POUR (IV SOLUTION) ×4 IMPLANT
WRAP KNEE MAXI GEL POST OP (GAUZE/BANDAGES/DRESSINGS) ×2 IMPLANT

## 2012-10-05 NOTE — Op Note (Signed)
NAME:  [facility] RECORD NO.:  [id number]                             FACILITY:  [facility]      PHYSICIAN:  Madlyn Frankel. Charlann Boxer, M.D.  DATE OF BIRTH:  1932/09/12      DATE OF PROCEDURE:  10/05/2012                                     OPERATIVE REPORT         PREOPERATIVE DIAGNOSIS:  Right knee osteoarthritis.      POSTOPERATIVE DIAGNOSIS:  Right knee osteoarthritis.      FINDINGS:  The patient was noted to have complete loss of cartilage and   bone-on-bone arthritis with associated osteophytes in predominantly the lateral and patellofemoral compartments of   the knee with a significant synovitis and associated effusion.      PROCEDURE:  Right total knee replacement.      COMPONENTS USED:  DePuy rotating platform posterior stabilized knee   system, a size 3 femur, 2.5 tibia, 15 mm PS insert, and 38 patellar   button.      SURGEON:  Madlyn Frankel. Charlann Boxer, M.D.      ASSISTANT:  Lanney Gins, PA-C.      ANESTHESIA:  Spinal.      SPECIMENS:  None.      COMPLICATION:  None.      DRAINS:  One Hemovac.  EBL: <100cc      TOURNIQUET TIME:   Total Tourniquet Time Documented: Thigh (Right) - 28 minutes Total: Thigh (Right) - 28 minutes  .      The patient was stable to the recovery room.      INDICATION FOR PROCEDURE:  [name] is a 79 y.o. female patient of   mine.  The patient had been seen, evaluated, and treated conservatively in the   office with medication, activity modification, and injections.  The patient had   radiographic changes of bone-on-bone arthritis with endplate sclerosis and osteophytes noted.      The patient failed conservative measures including medication, injections, and activity modification, and at this point was ready for more definitive measures.   Based on the radiographic changes and failed conservative measures, the patient   decided to proceed with total knee replacement.  Risks of infection,   DVT, component  failure, need for revision surgery, postop course, and   expectations were all   discussed and reviewed.  Consent was obtained for benefit of pain   relief.      PROCEDURE IN DETAIL:  The patient was brought to the operative theater.   Once adequate anesthesia, preoperative antibiotics, 900mg  of Clindamycin administered, the patient was positioned supine with the right thigh tourniquet placed.  The  right lower extremity was prepped and draped in sterile fashion.  A time-   out was performed identifying the patient, planned procedure, and   extremity.      The right lower extremity was placed in the [facility] leg holder.  The leg was   exsanguinated, tourniquet elevated to 250 mmHg.  A midline incision was   made followed by median parapatellar arthrotomy.  Following initial   exposure, attention was first directed  to the patella.  Precut   measurement was noted to be 22 mm.  I resected down to 13 mm and used a   38 patellar button to restore patellar height as well as cover the cut   surface.      The lug holes were drilled and a metal shim was placed to protect the   patella from retractors and saw blades.      At this point, attention was now directed to the femur.  The femoral   canal was opened with a drill, irrigated to try to prevent fat emboli.  An   intramedullary rod was passed at 5 degrees valgus, 10 mm of bone was   resected off the distal femur.  Following this resection, the tibia was   subluxated anteriorly.  Using the extramedullary guide, 4 mm of bone was resected off   the proximal lateral tibia.  We confirmed the gap would be   stable medially and laterally with a 10 mm insert as well as confirmed   the cut was perpendicular in the coronal plane, checking with an alignment rod.      Once this was done, I sized the femur to be a size 3 in the anterior-   posterior dimension, chose a standard component based on medial and   lateral dimension.  The size 3 rotation block  was then pinned in   position anterior referenced using the C-clamp to set rotation.  The   anterior, posterior, and  chamfer cuts were made without difficulty nor   notching making certain that I was along the anterior cortex to help   with flexion gap stability.      The final box cut was made off the lateral aspect of distal femur.      At this point, the tibia was sized to be a size 2.5, the size 2.5 tray was   then pinned in position through the medial third of the tubercle,   drilled, and keel punched.  Trial reduction was now carried with a 3 femur,  2.5 tibia, up to a 15 mm insert, and the 38 patella botton.  The knee was brought to   extension, full extension with good flexion stability with the patella   tracking through the trochlea without application of pressure.  Given   all these findings, the trial components removed.  Final components were   opened and cement was mixed.  The knee was irrigated with normal saline   solution and pulse lavage.  The synovial lining was   then injected with 0.25% Marcaine with epinephrine and 1 cc of Toradol,   total of 61 cc.      The knee was irrigated.  Final implants were then cemented onto clean and   dried cut surfaces of bone with the knee brought to extension with a 15 mm trial insert.      Once the cement had fully cured, the excess cement was removed   throughout the knee.  I confirmed I was satisfied with the range of   motion and stability, and the final 15 mm PS insert was chosen.  It was   placed into the knee.      The tourniquet had been let down at 29 minutes.  No significant   hemostasis required.  The medium Hemovac drain was placed deep.  The   extensor mechanism was then reapproximated using #1 Vicryl with the knee   in flexion.  The  remaining wound was closed with 2-0 Vicryl and running 4-0 Monocryl.   The knee was cleaned, dried, dressed sterilely using Dermabond and   Aquacel dressing.  Drain site dressed  separately.  The patient was then   brought to recovery room in stable condition, tolerating the procedure   well.   Please note that Physician Assistant, Lanney Gins, was present for the entirety of the case, and was utilized for pre-operative positioning, peri-operative retractor management, general facilitation of the procedure.  He was also utilized for primary wound closure at the end of the case.              Madlyn Frankel Charlann Boxer, M.D.

## 2012-10-05 NOTE — Preoperative (Addendum)
Beta Blockers   Reason not to administer Beta Blockers:Not Applicable 

## 2012-10-05 NOTE — Transfer of Care (Signed)
Immediate Anesthesia Transfer of Care Note  Patient: [name]  Procedure(s) Performed: Procedure(s): RIGHT TOTAL KNEE ARTHROPLASTY (Right)  Patient Location: PACU  Anesthesia Type:Spinal  Level of Consciousness: awake, alert  and patient cooperative  Airway & Oxygen Therapy: Patient Spontanous Breathing and Patient connected to face mask oxygen  Post-op Assessment: Report given to PACU RN and Post -op Vital signs reviewed and stable  Post vital signs: Reviewed and stable  Complications: No apparent anesthesia complications

## 2012-10-05 NOTE — Anesthesia Postprocedure Evaluation (Signed)
  Anesthesia Post-op Note  Patient: [name]  Procedure(s) Performed: Procedure(s) (LRB): RIGHT TOTAL KNEE ARTHROPLASTY (Right)  Patient Location: PACU  Anesthesia Type: Spinal  Level of Consciousness: awake and alert   Airway and Oxygen Therapy: Patient Spontanous Breathing  Post-op Pain: mild  Post-op Assessment: Post-op Vital signs reviewed, Patient's Cardiovascular Status Stable, Respiratory Function Stable, Patent Airway and No signs of Nausea or vomiting  Last Vitals:  Filed Vitals:   10/05/12 1645  BP:   Pulse: 74  Temp:   Resp:     Post-op Vital Signs: stable   Complications: No apparent anesthesia complications

## 2012-10-05 NOTE — Anesthesia Preprocedure Evaluation (Addendum)
Anesthesia Evaluation  Patient identified by MRN, date of birth, ID band Patient awake    Reviewed: Allergy & Precautions, H&P , NPO status , Patient's Chart, lab work & pertinent test results, reviewed documented beta blocker date and time   Airway Mallampati: II TM Distance: >3 FB Neck ROM: full    Dental  (+) Edentulous Upper and Missing Many lower missing teeth:   Pulmonary asthma , COPD COPD inhaler,  Severe asthma.  Chronic bronchitis breath sounds clear to auscultation  Pulmonary exam normal       Cardiovascular hypertension, Pt. on medications Rhythm:regular Rate:Normal     Neuro/Psych Back surgery negative neurological ROS  negative psych ROS   GI/Hepatic negative GI ROS, Neg liver ROS, GERD-  Medicated and Controlled,  Endo/Other  negative endocrine ROS  Renal/GU negative Renal ROS  negative genitourinary   Musculoskeletal   Abdominal   Peds  Hematology negative hematology ROS (+)   Anesthesia Other Findings   Reproductive/Obstetrics negative OB ROS                          Anesthesia Physical Anesthesia Plan  ASA: III  Anesthesia Plan: Spinal   Post-op Pain Management:    Induction:   Airway Management Planned: Simple Face Mask  Additional Equipment:   Intra-op Plan:   Post-operative Plan:   Informed Consent: I have reviewed the patients History and Physical, chart, labs and discussed the procedure including the risks, benefits and alternatives for the proposed anesthesia with the patient or authorized representative who has indicated his/her understanding and acceptance.   Dental Advisory Given  Plan Discussed with: CRNA and Surgeon  Anesthesia Plan Comments:         Anesthesia Quick Evaluation

## 2012-10-05 NOTE — Interval H&P Note (Signed)
History and Physical Interval Note:  10/05/2012 1:04 PM  [name]  has presented today for surgery, with the diagnosis of RIGHT KNEE OA  The various methods of treatment have been discussed with the patient and family. After consideration of risks, benefits and other options for treatment, the patient has consented to  Procedure(s): RIGHT TOTAL KNEE ARTHROPLASTY (Right) as a surgical intervention .  The patient's history has been reviewed, patient examined, no change in status, stable for surgery.  I have reviewed the patient's chart and labs.  Questions were answered to the patient's satisfaction.     Shelda Pal

## 2012-10-05 NOTE — Anesthesia Procedure Notes (Signed)
Spinal  Patient location during procedure: OR Start time: 10/05/2012 2:15 PM End time: 10/05/2012 2:23 PM Staffing Anesthesiologist: Ronelle Nigh L Performed by: anesthesiologist  Preanesthetic Checklist Completed: patient identified, site marked, surgical consent, pre-op evaluation, timeout performed, IV checked, risks and benefits discussed and monitors and equipment checked Spinal Block Patient position: sitting Prep: Betadine Patient monitoring: heart rate, continuous pulse ox and blood pressure Approach: right paramedian Location: L2-3 Injection technique: single-shot Needle Needle type: Spinocan  Needle gauge: 22 G Needle length: 9 cm Assessment Sensory level: T6 Additional Notes Expiration date of kit checked and confirmed. Patient tolerated procedure well, without complications.

## 2012-10-06 ENCOUNTER — Encounter (HOSPITAL_COMMUNITY): Payer: Self-pay | Admitting: Orthopedic Surgery

## 2012-10-06 DIAGNOSIS — D500 Iron deficiency anemia secondary to blood loss (chronic): Secondary | ICD-10-CM

## 2012-10-06 DIAGNOSIS — E663 Overweight: Secondary | ICD-10-CM

## 2012-10-06 LAB — BASIC METABOLIC PANEL
GFR calc non Af Amer: 33 mL/min — ABNORMAL LOW (ref >90)
Glucose, Bld: 148 mg/dL — ABNORMAL HIGH (ref 70–99)
Potassium: 4.9 mEq/L (ref 3.5–5.1)
Sodium: 137 mEq/L (ref 135–145)

## 2012-10-06 LAB — CBC
Hemoglobin: 10.2 g/dL — ABNORMAL LOW (ref 12.0–15.0)
MCHC: 32.8 g/dL (ref 30.0–36.0)
Platelets: 194 10*3/uL (ref 150–400)
RBC: 3.45 MIL/uL — ABNORMAL LOW (ref 3.87–5.11)

## 2012-10-06 NOTE — Evaluation (Signed)
Physical Therapy Evaluation Patient Details Name: [name] MRN: [id number] DOB: Oct 07, 1932 Today's Date: 10/06/2012 Time: 4098-1191 PT Time Calculation (min): 37 min  PT Assessment / Plan / Recommendation Clinical Impression  Pt s/p R TKR presents with decreased R LE strength/ROM and post op pain limiting functional mobility    PT Assessment  Patient needs continued PT services    Follow Up Recommendations  Home health PT    Does the patient have the potential to tolerate intense rehabilitation      Barriers to Discharge None      Equipment Recommendations  Rolling walker with 5" wheels    Recommendations for Other Services OT consult   Frequency 7X/week    Precautions / Restrictions Precautions Precautions: Fall;Knee Restrictions Weight Bearing Restrictions: No RLE Weight Bearing: Weight bearing as tolerated   Pertinent Vitals/Pain 6/10; premed, ice packs provided      Mobility  Bed Mobility Bed Mobility: Supine to Sit Supine to Sit: 4: Min assist Details for Bed Mobility Assistance: cues for sequence and use of L LE to self assist Transfers Transfers: Sit to Stand;Stand to Sit Sit to Stand: 4: Min assist;3: Mod assist Stand to Sit: 4: Min assist;3: Mod assist Details for Transfer Assistance: cues for LE management and use of UEs to self assist Ambulation/Gait Ambulation/Gait Assistance: 4: Min assist;3: Mod assist Ambulation Distance (Feet): 15 Feet Assistive device: Rolling walker Ambulation/Gait Assistance Details: cues for posture, sequence and position from RW Gait Pattern: Step-to pattern;Decreased stance time - right;Decreased step length - left;Decreased step length - right Stairs: No    Exercises Total Joint Exercises Ankle Circles/Pumps: AROM;15 reps;Supine;Both Quad Sets: AROM;10 reps;Both;Supine Heel Slides: AAROM;Supine;10 reps;Right Straight Leg Raises: AROM;AAROM;15 reps;Supine;Right   PT Diagnosis: Difficulty walking  PT Problem  List: Decreased strength;Decreased range of motion;Decreased activity tolerance;Decreased mobility;Decreased knowledge of use of DME;Pain;Decreased knowledge of precautions PT Treatment Interventions: DME instruction;Gait training;Stair training;Functional mobility training;Therapeutic activities;Therapeutic exercise;Patient/family education   PT Goals Acute Rehab PT Goals PT Goal Formulation: With patient Time For Goal Achievement: 10/13/12 Potential to Achieve Goals: Good Pt will go Supine/Side to Sit: with supervision PT Goal: Supine/Side to Sit - Progress: Goal set today Pt will go Sit to Supine/Side: with supervision PT Goal: Sit to Supine/Side - Progress: Goal set today Pt will go Sit to Stand: with supervision PT Goal: Sit to Stand - Progress: Goal set today Pt will go Stand to Sit: with supervision PT Goal: Stand to Sit - Progress: Goal set today Pt will Ambulate: 51 - 150 feet;with supervision;with rolling walker PT Goal: Ambulate - Progress: Goal set today Pt will Go Up / Down Stairs: 3-5 stairs;with min assist;with least restrictive assistive device PT Goal: Up/Down Stairs - Progress: Goal set today  Visit Information  Last PT Received On: 10/06/12 Assistance Needed: +2    Subjective Data  Subjective: I'm ready to get up Patient Stated Goal: Resume previous lifestyle with decreased pain   Prior Functioning  Home Living Lives With: Spouse Available Help at Discharge: Family Type of Home: House Home Access: Stairs to enter Secretary/administrator of Steps: 2 Entrance Stairs-Rails: Right Home Layout: One level Home Adaptive Equipment: None Prior Function Level of Independence: Independent Able to Take Stairs?: Yes Driving: Yes Vocation: Retired Musician: No difficulties Dominant Hand: Right    Cognition  Cognition Arousal/Alertness: Awake/alert Behavior During Therapy: WFL for tasks assessed/performed Overall Cognitive Status: Within  Functional Limits for tasks assessed    Extremity/Trunk Assessment Right Upper Extremity Assessment RUE ROM/Strength/Tone:  WFL for tasks assessed Left Upper Extremity Assessment LUE ROM/Strength/Tone: WFL for tasks assessed Right Lower Extremity Assessment RLE ROM/Strength/Tone: Deficits RLE ROM/Strength/Tone Deficits: 3/5 quads with AAROM at knee -10 - 60 Left Lower Extremity Assessment LLE ROM/Strength/Tone: WFL for tasks assessed   Balance    End of Session PT - End of Session Equipment Utilized During Treatment: Gait belt Activity Tolerance: Patient tolerated treatment well Patient left: in chair;with call bell/phone within reach;with nursing in room Nurse Communication: Mobility status  GP     Tonee Silverstein 10/06/2012, 1:12 PM

## 2012-10-06 NOTE — Progress Notes (Signed)
Received orders for rw and commode.  Both delivered to hospital room.  °

## 2012-10-06 NOTE — Anesthesia Postprocedure Evaluation (Signed)
  Anesthesia Post-op Note  Patient: [name]  Procedure(s) Performed: Procedure(s) (LRB): RIGHT TOTAL KNEE ARTHROPLASTY (Right)  Patient Location: PACU  Anesthesia Type: Spinal  Level of Consciousness: awake and alert   Airway and Oxygen Therapy: Patient Spontanous Breathing  Post-op Pain: mild  Post-op Assessment: Post-op Vital signs reviewed, Patient's Cardiovascular Status Stable, Respiratory Function Stable, Patent Airway and No signs of Nausea or vomiting  Last Vitals:  Filed Vitals:   10/06/12 0158  BP: 98/53  Pulse: 64  Temp: 36.5 C  Resp: 18    Post-op Vital Signs: stable   Complications: No apparent anesthesia complications

## 2012-10-06 NOTE — Progress Notes (Signed)
Physical Therapy Treatment Patient Details Name: [name] MRN: [id number] DOB: [birth year] Today's Date: 10/06/2012 Time: 1478-2956 PT Time Calculation (min): 28 min  PT Assessment / Plan / Recommendation Comments on Treatment Session  POD # 1 pm session R TKR.  Assisted pt OOB to amb in hallway then back to bed.  Pt reports 6/10 R knee pain with act, pain meds requested and ICE applied.    Follow Up Recommendations  Home health PT     Does the patient have the potential to tolerate intense rehabilitation     Barriers to Discharge        Equipment Recommendations  Rolling walker with 5" wheels    Recommendations for Other Services    Frequency 7X/week   Plan      Precautions / Restrictions   knee  Pertinent Vitals/Pain C/o 8/10 with act ICE applied    Mobility  Bed Mobility Bed Mobility: Supine to Sit;Sit to Supine Supine to Sit: 4: Min assist Sit to Supine: 4: Min assist Details for Bed Mobility Assistance: cues for sequence and use of L LE to self assist Transfers Transfers: Sit to Stand;Stand to Sit Sit to Stand: 4: Min assist;From bed Stand to Sit: To bed;4: Min assist Details for Transfer Assistance: cues for LE management and use of UEs to self assist Ambulation/Gait Ambulation/Gait Assistance: 4: Min assist Ambulation Distance (Feet): 28 Feet Assistive device: Rolling walker Ambulation/Gait Assistance Details: <25% VC's on safety with turns and upright posture Gait Pattern: Step-to pattern;Decreased stance time - right;Decreased step length - left;Decreased step length - right Gait velocity: decreased     PT Goals                                                        progressing    Visit Information  Last PT Received On: 10/06/12 Assistance Needed: +1    Subjective Data      Cognition       Balance     End of Session PT - End of Session Equipment Utilized During Treatment: Gait belt Activity Tolerance: Patient tolerated treatment  well Patient left: in bed;with call bell/phone within reach Nurse Communication: Patient requests pain meds   Felecia Shelling  PTA [facility]  Acute  Rehab Pager      [phone]

## 2012-10-06 NOTE — Progress Notes (Signed)
   Subjective: 1 Day Post-Op Procedure(s) (LRB): RIGHT TOTAL KNEE ARTHROPLASTY (Right)   Patient reports pain as mild, pain well controlled. No events throughout the night.   Objective:   VITALS:   Filed Vitals:   10/06/12 0158  BP: 98/53  Pulse: 64  Temp: 97.7 F (36.5 C)  Resp: 18    Neurovascular intact Dorsiflexion/Plantar flexion intact Incision: dressing C/D/I No cellulitis present Compartment soft  LABS  Recent Labs  10/06/12 0420  HGB 10.2*  HCT 31.1*  WBC 7.7  PLT 194     Recent Labs  10/06/12 0420  NA 137  K 4.9  BUN 26*  CREATININE 1.47*  GLUCOSE 148*     Assessment/Plan: 1 Day Post-Op Procedure(s) (LRB): RIGHT TOTAL KNEE ARTHROPLASTY (Right) HV drain d/c'ed Foley cath d/c'ed Advance diet Up with therapy D/C IV fluids Discharge home with home health eventually, when ready   Expected ABLA  Treated with iron and will observe  Overweight (BMI 25-29.9) Estimated body mass index is 27.24 kg/(m^2) as calculated from the following:   Height as of this encounter: 5\' 4"  (1.626 m).   Weight as of this encounter: 72.031 kg (158 lb 12.8 oz). Patient also counseled that weight may inhibit the healing process Patient counseled that losing weight will help with future health issues       Anastasio Auerbach. Samiyyah Moffa   PAC  10/06/2012, 8:49 AM

## 2012-10-07 LAB — CBC
HCT: 27.7 % — ABNORMAL LOW (ref 36.0–46.0)
MCHC: 31.8 g/dL (ref 30.0–36.0)
RDW: 13.2 % (ref 11.5–15.5)

## 2012-10-07 LAB — BASIC METABOLIC PANEL
BUN: 31 mg/dL — ABNORMAL HIGH (ref 6–23)
Calcium: 8.2 mg/dL — ABNORMAL LOW (ref 8.4–10.5)
Creatinine, Ser: 1.59 mg/dL — ABNORMAL HIGH (ref 0.50–1.10)
GFR calc Af Amer: 35 mL/min — ABNORMAL LOW (ref >90)
GFR calc non Af Amer: 30 mL/min — ABNORMAL LOW (ref >90)
Potassium: 5.2 mEq/L — ABNORMAL HIGH (ref 3.5–5.1)

## 2012-10-07 MED ORDER — FERROUS SULFATE 325 (65 FE) MG PO TABS: 325.0000 mg | ORAL_TABLET | Freq: Three times a day (TID) | ORAL | Status: DC

## 2012-10-07 MED ORDER — POLYETHYLENE GLYCOL 3350 17 G PO PACK: 17.0000 g | PACK | Freq: Two times a day (BID) | ORAL | Status: DC

## 2012-10-07 MED ORDER — HYDROCODONE-ACETAMINOPHEN 7.5-325 MG PO TABS: 1.0000 | ORAL_TABLET | ORAL | Status: DC

## 2012-10-07 MED ORDER — ASPIRIN EC 325 MG PO TBEC: 325.0000 mg | DELAYED_RELEASE_TABLET | Freq: Two times a day (BID) | ORAL | Status: DC

## 2012-10-07 MED ORDER — DSS 100 MG PO CAPS: 100.0000 mg | ORAL_CAPSULE | Freq: Two times a day (BID) | ORAL | Status: DC

## 2012-10-07 MED ORDER — TIZANIDINE HCL 4 MG PO CAPS: 4.0000 mg | ORAL_CAPSULE | Freq: Three times a day (TID) | ORAL | Status: DC

## 2012-10-07 MED ORDER — RIVAROXABAN 10 MG PO TABS
10.0000 mg | ORAL_TABLET | ORAL | Status: DC
  Filled 2012-10-07: qty 1

## 2012-10-07 MED ORDER — RIVAROXABAN 10 MG PO TABS: 10.0000 mg | ORAL_TABLET | ORAL | Status: DC

## 2012-10-07 NOTE — Progress Notes (Signed)
Physical Therapy Treatment Patient Details Name: [name] MRN: [id number] DOB: 08/10/1932 Today's Date: 10/07/2012 Time: 4098-1191 PT Time Calculation (min): 38 min  PT Assessment / Plan / Recommendation Comments on Treatment Session  Pt limited by nausea - will return for stair training    Follow Up Recommendations  Home health PT     Does the patient have the potential to tolerate intense rehabilitation     Barriers to Discharge        Equipment Recommendations  Rolling walker with 5" wheels    Recommendations for Other Services OT consult  Frequency 7X/week   Plan Discharge plan remains appropriate    Precautions / Restrictions Precautions Precautions: Fall;Knee Restrictions Weight Bearing Restrictions: No RLE Weight Bearing: Weight bearing as tolerated   Pertinent Vitals/Pain 4/10; premed, cold packs provided    Mobility  Bed Mobility Bed Mobility: Supine to Sit Supine to Sit: 5: Supervision Details for Bed Mobility Assistance: cues for sequence and use of L LE to self assist Transfers Transfers: Sit to Stand;Stand to Sit Sit to Stand: 4: Min guard Stand to Sit: 4: Min guard Details for Transfer Assistance: cues for LE management and use of UEs to self assist Ambulation/Gait Ambulation/Gait Assistance: 4: Min guard Ambulation Distance (Feet): 53 Feet Assistive device: Rolling walker Ambulation/Gait Assistance Details: min cues for posture and position from RW Gait Pattern: Step-to pattern;Decreased stance time - right;Decreased step length - left;Decreased step length - right Stairs: No (Pt feeling nauseous - stairs deferred)    Exercises Total Joint Exercises Ankle Circles/Pumps: AROM;15 reps;Supine;Both Quad Sets: AROM;Both;Supine;20 reps Heel Slides: AAROM;Supine;Right;20 reps Straight Leg Raises: AROM;AAROM;Supine;Right;20 reps   PT Diagnosis:    PT Problem List:   PT Treatment Interventions:     PT Goals Acute Rehab PT Goals PT Goal  Formulation: With patient Time For Goal Achievement: 10/13/12 Potential to Achieve Goals: Good Pt will go Supine/Side to Sit: with supervision PT Goal: Supine/Side to Sit - Progress: Progressing toward goal Pt will go Sit to Supine/Side: with supervision PT Goal: Sit to Supine/Side - Progress: Progressing toward goal Pt will go Sit to Stand: with supervision PT Goal: Sit to Stand - Progress: Progressing toward goal Pt will go Stand to Sit: with supervision PT Goal: Stand to Sit - Progress: Progressing toward goal Pt will Ambulate: 51 - 150 feet;with supervision;with rolling walker PT Goal: Ambulate - Progress: Progressing toward goal Pt will Go Up / Down Stairs: 3-5 stairs;with min assist;with least restrictive assistive device  Visit Information  Last PT Received On: 10/07/12 Assistance Needed: +1    Subjective Data  Subjective: I'm going home today Patient Stated Goal: Resume previous lifestyle with decreased pain   Cognition  Cognition Arousal/Alertness: Awake/alert Behavior During Therapy: WFL for tasks assessed/performed Overall Cognitive Status: Within Functional Limits for tasks assessed    Balance     End of Session PT - End of Session Equipment Utilized During Treatment: Gait belt Activity Tolerance: Patient tolerated treatment well;Other (comment) (onset of nausea) Patient left: in chair;with call bell/phone within reach;with nursing in room Nurse Communication: Other (comment) (nausea)   GP     [name] 10/07/2012, 12:54 PM

## 2012-10-07 NOTE — Progress Notes (Signed)
Physical Therapy Treatment Patient Details Name: [name] MRN: [id number] DOB: 01-10-1933 Today's Date: 10/07/2012 Time: 1478-2956 PT Time Calculation (min): 16 min  PT Assessment / Plan / Recommendation Comments on Treatment Session  Pt limited by nausea - will return for stair training    Follow Up Recommendations  Home health PT     Does the patient have the potential to tolerate intense rehabilitation     Barriers to Discharge        Equipment Recommendations  Rolling walker with 5" wheels    Recommendations for Other Services OT consult  Frequency 7X/week   Plan Discharge plan remains appropriate    Precautions / Restrictions Precautions Precautions: Fall;Knee Restrictions Weight Bearing Restrictions: No RLE Weight Bearing: Weight bearing as tolerated   Pertinent Vitals/Pain 4/10; premed    Mobility  Bed Mobility Bed Mobility: Supine to Sit Supine to Sit: 5: Supervision Details for Bed Mobility Assistance: cues for sequence and use of L LE to self assist Transfers Transfers: Sit to Stand;Stand to Sit Sit to Stand: 5: Supervision Stand to Sit: 5: Supervision Details for Transfer Assistance: cues for LE management and use of UEs to self assist Ambulation/Gait Ambulation/Gait Assistance: 4: Min guard;5: Supervision Ambulation Distance (Feet): 30 Feet Assistive device: Rolling walker Ambulation/Gait Assistance Details: min cues for position from RW Gait Pattern: Step-to pattern Stairs: Yes Stairs Assistance: 4: Min assist Stairs Assistance Details (indicate cue type and reason): cues for sequence and foot/crutch placement Stair Management Technique: One rail Left;With crutches;Forwards;Step to pattern Number of Stairs: 2 (twice) Spouse assisting with 2nd attempt   Exercises Total Joint Exercises Ankle Circles/Pumps: AROM;15 reps;Supine;Both Quad Sets: AROM;Both;Supine;20 reps Heel Slides: AAROM;Supine;Right;20 reps Straight Leg Raises:  AROM;AAROM;Supine;Right;20 reps   PT Diagnosis:    PT Problem List:   PT Treatment Interventions:     PT Goals Acute Rehab PT Goals PT Goal Formulation: With patient Time For Goal Achievement: 10/13/12 Potential to Achieve Goals: Good Pt will go Supine/Side to Sit: with supervision PT Goal: Supine/Side to Sit - Progress: Progressing toward goal Pt will go Sit to Supine/Side: with supervision PT Goal: Sit to Supine/Side - Progress: Progressing toward goal Pt will go Sit to Stand: with supervision PT Goal: Sit to Stand - Progress: Progressing toward goal Pt will go Stand to Sit: with supervision PT Goal: Stand to Sit - Progress: Progressing toward goal Pt will Ambulate: 51 - 150 feet;with supervision;with rolling walker PT Goal: Ambulate - Progress: Progressing toward goal Pt will Go Up / Down Stairs: 3-5 stairs;with min assist;with least restrictive assistive device PT Goal: Up/Down Stairs - Progress: Progressing toward goal  Visit Information  Last PT Received On: 10/07/12 Assistance Needed: +1    Subjective Data  Subjective: I'm going home today Patient Stated Goal: Resume previous lifestyle with decreased pain   Cognition  Cognition Arousal/Alertness: Awake/alert Behavior During Therapy: WFL for tasks assessed/performed Overall Cognitive Status: Within Functional Limits for tasks assessed    Balance     End of Session PT - End of Session Equipment Utilized During Treatment: Gait belt Activity Tolerance: Patient tolerated treatment well;Other (comment) Patient left: in chair;with call bell/phone within reach;with family/visitor present Nurse Communication: Other (comment) (nausea)   GP     Adar Rase 10/07/2012, 12:59 PM

## 2012-10-07 NOTE — Plan of Care (Signed)
Problem: Phase II Progression Outcomes Goal: Discharge plan established Outcome: Completed/Met Date Met:  10/07/12 PT eval recommends HH with HHPT

## 2012-10-07 NOTE — Plan of Care (Signed)
Problem: Phase I Progression Outcomes Goal: Initial discharge plan identified Outcome: Completed/Met Date Met:  10/07/12 Home with HHPT  Problem: Phase II Progression Outcomes Goal: Tolerating diet Outcome: Completed/Met Date Met:  10/07/12 Tolerating Regular diet well

## 2012-10-07 NOTE — Plan of Care (Signed)
Problem: Phase III Progression Outcomes Goal: Pain controlled on oral analgesia Outcome: Completed/Met Date Met:  10/07/12 Pt reports adequate pain control while using Vicodin tabs 2 po q4hours

## 2012-10-07 NOTE — Progress Notes (Signed)
   Subjective: 2 Days Post-Op Procedure(s) (LRB): RIGHT TOTAL KNEE ARTHROPLASTY (Right)   Patient reports pain as mild, pain well controlled. No events throughout the night. Ready to be discharged home.  Objective:   VITALS:   Filed Vitals:   10/07/12 0547  BP: 107/65  Pulse: 70  Temp: 98.3 F (36.8 C)  Resp: 18    Neurovascular intact Dorsiflexion/Plantar flexion intact Incision: dressing C/D/I No cellulitis present Compartment soft  LABS  Recent Labs  10/06/12 0420 10/07/12 0405  HGB 10.2* 8.8*  HCT 31.1* 27.7*  WBC 7.7 7.3  PLT 194 201     Recent Labs  10/06/12 0420 10/07/12 0405  NA 137 133*  K 4.9 5.2*  BUN 26* 31*  CREATININE 1.47* 1.59*  GLUCOSE 148* 143*     Assessment/Plan: 2 Days Post-Op Procedure(s) (LRB): RIGHT TOTAL KNEE ARTHROPLASTY (Right) Up with therapy Discharge home with home health Follow up in 2 weeks at [facility]. Follow up with OLIN,Laure Leone D in 2 weeks.  Contact information:  [facility] [address], [address] [place] [address] [phone]    Expected ABLA  Treated with iron and will observe  Expected ABLA  Treated with iron and will observe   Overweight (BMI 25-29.9)  Estimated body mass index is 27.24 kg/(m^2) as calculated from the following:      Height as of this encounter: 5\' 4"  (1.626 m).      Weight as of this encounter: 72.031 kg (158 lb 12.8 oz).  Patient also counseled that weight may inhibit the healing process  Patient counseled that losing weight will help with future health issues       Anastasio Auerbach. Cyra Spader   PAC  10/07/2012, 8:07 AM

## 2012-10-08 DIAGNOSIS — M25519 Pain in unspecified shoulder: Secondary | ICD-10-CM | POA: Diagnosis not present

## 2012-10-08 DIAGNOSIS — I129 Hypertensive chronic kidney disease with stage 1 through stage 4 chronic kidney disease, or unspecified chronic kidney disease: Secondary | ICD-10-CM | POA: Diagnosis not present

## 2012-10-08 DIAGNOSIS — J449 Chronic obstructive pulmonary disease, unspecified: Secondary | ICD-10-CM | POA: Diagnosis not present

## 2012-10-08 DIAGNOSIS — Z471 Aftercare following joint replacement surgery: Secondary | ICD-10-CM | POA: Diagnosis not present

## 2012-10-08 DIAGNOSIS — IMO0001 Reserved for inherently not codable concepts without codable children: Secondary | ICD-10-CM | POA: Diagnosis not present

## 2012-10-08 DIAGNOSIS — Z96659 Presence of unspecified artificial knee joint: Secondary | ICD-10-CM | POA: Diagnosis not present

## 2012-10-12 NOTE — Discharge Summary (Signed)
Physician Discharge Summary  Patient ID: [name] MRN: [id number] DOB/AGE: 1932/05/28 79 y.o.  Admit date: 10/05/2012 Discharge date: 10/07/2012   Procedures:  Procedure(s) (LRB): RIGHT TOTAL KNEE ARTHROPLASTY (Right)  Attending Physician:  Dr. Durene Romans   Admission Diagnoses:   Right knee OA / pain  Discharge Diagnoses:  Principal Problem:   S/P right TKA Active Problems:   Expected blood loss anemia   Overweight (BMI 25.0-29.9)  Past Medical History  Diagnosis Date  . Restless legs syndrome (RLS)   . Osteoporosis, unspecified     Knee and hip osteoarthritis bilaterally  . Unspecified essential hypertension   . Other diseases of vocal cords   . Unspecified asthma(493.90)   . Esophageal reflux   . Allergic rhinitis, cause unspecified   . Obstructive chronic bronchitis without exacerbation   . Hyperlipidemia   . Gallbladder disease   . Degenerative disk disease   . Perennial allergic rhinitis   . Diverticulosis   . Lymphedema     in the right arm  . Osteopenia   . Shingles 2007  . Chronic kidney disease, stage II (mild)   . Dysrhythmia     "skips a beat sometimes"  . Pneumonia     x2  . Cancer     right breast  . Blood clot in vein     behind right knee    HPI:    [name], 79 y.o. female, has a history of pain and functional disability in the right knee due to arthritis and has failed non-surgical conservative treatments for greater than 12 weeks to includeNSAID's and/or analgesics, corticosteriod injections and activity modification. Onset of symptoms was gradual, starting >10 years ago with gradually worsening course since that time. The patient noted no past surgery on the right knee(s). Patient currently rates pain in the right knee(s) at 10 out of 10 with activity. Patient has night pain, worsening of pain with activity and weight bearing, pain that interferes with activities of daily living, pain with passive range of motion, crepitus and  joint swelling. Patient has evidence of subchondral sclerosis, periarticular osteophytes and joint space narrowing by imaging studies. There is no active infection. Risks, benefits and expectations were discussed with the patient. Patient understand the risks, benefits and expectations and wishes to proceed with surgery.  PCP: Ezequiel Kayser, MD   Discharged Condition: good  Hospital Course:  Patient underwent the above stated procedure on 10/05/2012. Patient tolerated the procedure well and brought to the recovery room in good condition and subsequently to the floor.  POD #1 BP: 98/53 ; Pulse: 64 ; Temp: 97.7 F (36.5 C) ; Resp: 18  Pt's foley was removed, as well as the hemovac drain removed. IV was changed to a saline lock. Patient reports pain as mild, pain well controlled. No events throughout the night.  Neurovascular intact, dorsiflexion/plantar flexion intact, incision: dressing C/D/I, no cellulitis present and compartment soft.   LABS  Basename  10/06/12    0420  HGB  10.2  HCT  31.1   POD #2  BP: 107/65 ; Pulse: 70 ; Temp: 98.3 F (36.8 C) ; Resp: 18 Patient reports pain as mild, pain well controlled. No events throughout the night. Ready to be discharged home. Neurovascular intact, dorsiflexion/plantar flexion intact, incision: dressing C/D/I, no cellulitis present and compartment soft.   LABS  Basename  10/07/12    0405   HGB  8.8  HCT  27.7    Discharge Exam: General appearance: alert, cooperative  and no distress Extremities: Homans sign is negative, no sign of DVT, no edema, redness or tenderness in the calves or thighs and no ulcers, gangrene or trophic changes  Disposition:   Home-Health Care Svc with follow up in 2 weeks   Follow-up Information   Follow up with Shelda Pal, MD. Schedule an appointment as soon as possible for a visit in 2 weeks.   Contact information:   [address] [place] [address] [phone]       Discharge Orders    Future Appointments Provider Department Dept Phone   10/18/2012 3:30 PM [facility] [phone]   11/15/2012 2:15 PM Storm Frisk, MD [facility] [phone]   Future Orders Complete By Expires     Call MD / Call 911  As directed     Comments:      If you experience chest pain or shortness of breath, CALL 911 and be transported to the hospital emergency room.  If you develope a fever above 101 F, pus (white drainage) or increased drainage or redness at the wound, or calf pain, call your surgeon's office.    Change dressing  As directed     Comments:      Maintain surgical dressing for 10-14 days, then change the dressing daily with sterile 4 x 4 inch gauze dressing and tape. Keep the area dry and clean.    Constipation Prevention  As directed     Comments:      Drink plenty of fluids.  Prune juice may be helpful.  You may use a stool softener, such as Colace (over the counter) 100 mg twice a day.  Use MiraLax (over the counter) for constipation as needed.    Diet - low sodium heart healthy  As directed     Discharge instructions  As directed     Comments:      Maintain surgical dressing for 10-14 days, then replace with gauze and tape. Keep the area dry and clean until follow up. Follow up in 2 weeks at [facility] Call with any questions or concerns.    Driving restrictions  As directed     Comments:      No driving for 4 weeks    Increase activity slowly as tolerated  As directed     TED hose  As directed     Comments:      Use stockings (TED hose) for 2 weeks on both leg(s).  You may remove them at night for sleeping.    Weight bearing as tolerated  As directed          Medication List    STOP taking these medications       aspirin 81 MG tablet     ciprofloxacin 500 MG tablet  Commonly known as:  CIPRO     diclofenac sodium 1 % Gel  Commonly known as:  VOLTAREN     traMADol 50 MG tablet  Commonly known as:   ULTRAM      TAKE these medications       AEROCHAMBER MV inhaler  by Other route. Use as instructed     albuterol 108 (90 BASE) MCG/ACT inhaler  Commonly known as:  PROVENTIL HFA;VENTOLIN HFA  Inhale 2 puffs into the lungs every 6 (six) hours as needed for wheezing.     ALPRAZolam 0.5 MG tablet  Commonly known as:  XANAX  Take 0.5 mg by mouth 2 (two) times daily  as needed for anxiety.     aspirin EC 325 MG tablet  Take 1 tablet (325 mg total) by mouth 2 (two) times daily.     azelastine 137 MCG/SPRAY nasal spray  Commonly known as:  ASTELIN  Place 1 spray into the nose 2 (two) times daily. Use in each nostril as directed     benzonatate 100 MG capsule  Commonly known as:  TESSALON  Take 100 mg by mouth 3 (three) times daily as needed for cough.     budesonide-formoterol 160-4.5 MCG/ACT inhaler  Commonly known as:  SYMBICORT  Inhale 2 puffs into the lungs 2 (two) times daily.     CALTRATE 600+D 600-400 MG-UNIT per tablet  Generic drug:  Calcium Carbonate-Vitamin D  Take 1 tablet by mouth daily.     chlorpheniramine 4 MG tablet  Commonly known as:  CHLOR-TRIMETON  Take 4 mg by mouth at bedtime.     cholecalciferol 1000 UNITS tablet  Commonly known as:  VITAMIN D  Take 1,000 Units by mouth daily.     dextromethorphan 30 MG/5ML liquid  Commonly known as:  DELSYM  Take 60 mg by mouth 2 (two) times daily as needed for cough.     DSS 100 MG Caps  Take 100 mg by mouth 2 (two) times daily.     esomeprazole 40 MG capsule  Commonly known as:  NEXIUM  Take 40 mg by mouth 2 (two) times daily.     ferrous sulfate 325 (65 FE) MG tablet  Take 1 tablet (325 mg total) by mouth 3 (three) times daily after meals.     fexofenadine 180 MG tablet  Commonly known as:  ALLEGRA  Take 180 mg by mouth daily.     furosemide 40 MG tablet  Commonly known as:  LASIX  Take 40 mg by mouth every morning.     HYDROcodone-acetaminophen 7.5-325 MG per tablet  Commonly known as:  NORCO    Take 1-2 tablets by mouth every 4 (four) hours.     ibandronate 150 MG tablet  Commonly known as:  BONIVA  Take 150 mg by mouth every 30 (thirty) days. Take in the morning with a full glass of water, on an empty stomach, and do not take anything else by mouth or lie down for the next 30 min.     mometasone 50 MCG/ACT nasal spray  Commonly known as:  NASONEX  Place 1 spray into the nose daily.     montelukast 10 MG tablet  Commonly known as:  SINGULAIR  Take 10 mg by mouth at bedtime.     mupirocin ointment 2 %  Commonly known as:  BACTROBAN  Apply topically 2 (two) times daily. In nostrils     polyethylene glycol packet  Commonly known as:  MIRALAX / GLYCOLAX  Take 17 g by mouth 2 (two) times daily.     potassium chloride 10 MEQ tablet  Commonly known as:  K-DUR,KLOR-CON  Take 20 mEq by mouth daily. Two tablets once dail     pramipexole 0.125 MG tablet  Commonly known as:  MIRAPEX  Take 0.375 mg by mouth at bedtime.     rivaroxaban 10 MG Tabs tablet  Commonly known as:  XARELTO  Take 1 tablet (10 mg total) by mouth daily.     rosuvastatin 5 MG tablet  Commonly known as:  CRESTOR  Take 5 mg by mouth at bedtime.     telmisartan 40 MG tablet  Commonly known as:  MICARDIS  Take 20 mg by mouth at bedtime.     tiZANidine 4 MG capsule  Commonly known as:  ZANAFLEX  Take 1 capsule (4 mg total) by mouth 3 (three) times daily. Muscle spasms     trolamine salicylate 10 % cream  Commonly known as:  ASPERCREME  Apply 1 application topically 2 (two) times daily as needed (pain).     vitamin B-12 500 MCG tablet  Commonly known as:  CYANOCOBALAMIN  Take 500 mcg by mouth daily.     XOLAIR 150 MG injection  Generic drug:  omalizumab  Inject 150 mg into the skin every 14 (fourteen) days.         Signed: Anastasio Auerbach. Burch Marchuk   PAC  10/12/2012, 3:58 PM

## 2012-10-18 ENCOUNTER — Ambulatory Visit (INDEPENDENT_AMBULATORY_CARE_PROVIDER_SITE_OTHER): Payer: Medicare Other

## 2012-10-18 DIAGNOSIS — J45909 Unspecified asthma, uncomplicated: Secondary | ICD-10-CM | POA: Diagnosis not present

## 2012-10-20 MED ORDER — OMALIZUMAB 150 MG ~~LOC~~ SOLR
300.0000 mg | Freq: Once | SUBCUTANEOUS | Status: AC
  Administered 2012-10-20: 300 mg via SUBCUTANEOUS

## 2012-10-26 DIAGNOSIS — M25569 Pain in unspecified knee: Secondary | ICD-10-CM | POA: Diagnosis not present

## 2012-10-28 DIAGNOSIS — M25569 Pain in unspecified knee: Secondary | ICD-10-CM | POA: Diagnosis not present

## 2012-11-01 ENCOUNTER — Ambulatory Visit (INDEPENDENT_AMBULATORY_CARE_PROVIDER_SITE_OTHER): Payer: Medicare Other

## 2012-11-01 DIAGNOSIS — J45909 Unspecified asthma, uncomplicated: Secondary | ICD-10-CM | POA: Diagnosis not present

## 2012-11-01 MED ORDER — OMALIZUMAB 150 MG ~~LOC~~ SOLR
300.0000 mg | Freq: Once | SUBCUTANEOUS | Status: AC
  Administered 2012-11-01: 300 mg via SUBCUTANEOUS

## 2012-11-02 DIAGNOSIS — M25569 Pain in unspecified knee: Secondary | ICD-10-CM | POA: Diagnosis not present

## 2012-11-05 DIAGNOSIS — M25569 Pain in unspecified knee: Secondary | ICD-10-CM | POA: Diagnosis not present

## 2012-11-09 DIAGNOSIS — M25569 Pain in unspecified knee: Secondary | ICD-10-CM | POA: Diagnosis not present

## 2012-11-11 DIAGNOSIS — M25569 Pain in unspecified knee: Secondary | ICD-10-CM | POA: Diagnosis not present

## 2012-11-15 ENCOUNTER — Ambulatory Visit (INDEPENDENT_AMBULATORY_CARE_PROVIDER_SITE_OTHER): Payer: Medicare Other | Admitting: Critical Care Medicine

## 2012-11-15 ENCOUNTER — Ambulatory Visit (INDEPENDENT_AMBULATORY_CARE_PROVIDER_SITE_OTHER): Payer: Medicare Other

## 2012-11-15 ENCOUNTER — Encounter: Payer: Self-pay | Admitting: Critical Care Medicine

## 2012-11-15 VITALS — BP 144/68 | HR 101 | Temp 97.9°F | Ht 64.0 in | Wt 154.2 lb

## 2012-11-15 DIAGNOSIS — J45901 Unspecified asthma with (acute) exacerbation: Secondary | ICD-10-CM

## 2012-11-15 DIAGNOSIS — J309 Allergic rhinitis, unspecified: Secondary | ICD-10-CM | POA: Diagnosis not present

## 2012-11-15 DIAGNOSIS — J45909 Unspecified asthma, uncomplicated: Secondary | ICD-10-CM

## 2012-11-15 DIAGNOSIS — J4551 Severe persistent asthma with (acute) exacerbation: Secondary | ICD-10-CM

## 2012-11-15 MED ORDER — METHYLPREDNISOLONE ACETATE 80 MG/ML IJ SUSP
120.0000 mg | Freq: Once | INTRAMUSCULAR | Status: AC
  Administered 2012-11-15: 120 mg via INTRAMUSCULAR

## 2012-11-15 NOTE — Assessment & Plan Note (Signed)
Severe persistent asthma with flare Plan A depomedrol injection will be made 120mg  IM No change in other medications  Return 2  months

## 2012-11-15 NOTE — Progress Notes (Signed)
Subjective:    Patient ID: [name], female    DOB: 1933-03-05, 79 y.o.   MRN: [id number]  HPI 79 y.o. Moderate persistent asthma with severe atopy   11/15/2012 Chief Complaint  Patient presents with  . 6 wk follow up    C/o head congestion over the past few wks, chest congestion, increased dry cough, a little wheezing, and SOB that "comes and goes."  Had knee surgery x 6 wks ago.    Pt notes more chest congestion.  TKR went ok and almost fully healed.  Notes some wheezing.  Sl cough esp at night Notes more pndrip.  Notes some DOE, worse if get in car will get a cough   PUL ASTHMA HISTORY 11/15/2012 09/29/2012 07/22/2012 07/21/2012 08/30/2010  Symptoms Daily 0-2 days/week Daily Daily Throughout the day  Nighttime awakenings 0-2/month 0-2/month 3-4/month 3-4/month >1/wk but not nightly  Interference with activity Some limitations Minor limitations Some limitations Some limitations Some limitations  SABA use > 2 days/wk--not > 1 x/day 0-2 days/wk Daily Daily Daily  Exacerbations requiring oral steroids 2 or more / year 0-1 / year 2 or more / year - 0-1 / year      Past Medical History  Diagnosis Date  . Restless legs syndrome (RLS)   . Osteoporosis, unspecified     Knee and hip osteoarthritis bilaterally  . Unspecified essential hypertension   . Other diseases of vocal cords   . Unspecified asthma(493.90)   . Esophageal reflux   . Allergic rhinitis, cause unspecified   . Obstructive chronic bronchitis without exacerbation   . Hyperlipidemia   . Gallbladder disease   . Degenerative disk disease   . Perennial allergic rhinitis   . Diverticulosis   . Lymphedema     in the right arm  . Osteopenia   . Shingles 2007  . Chronic kidney disease, stage II (mild)   . Dysrhythmia     "skips a beat sometimes"  . Pneumonia     x2  . Cancer     right breast  . Blood clot in vein     behind right knee     Family History  Problem Relation Age of Onset  . Heart attack Mother   .  Heart attack Father   . Cancer Brother   . Asthma Brother   . Coronary artery disease Sister   . Asthma Mother   . Heart failure Father      History   Social History  . Marital Status: Married    Spouse Name: N/A    Number of Children: N/A  . Years of Education: N/A   Occupational History  . Not on file.   Social History Main Topics  . Smoking status: Never Smoker   . Smokeless tobacco: Never Used  . Alcohol Use: No  . Drug Use: No  . Sexually Active: Not on file   Other Topics Concern  . Not on file   Social History Narrative  . No narrative on file     Allergies  Allergen Reactions  . Cephalosporins     REACTION: rash  . Codeine     REACTION: rash  . Eggs Or Egg-Derived Products   . Levofloxacin     REACTION: rash  . Penicillins     REACTION: rash  . Pneumococcal Vaccines Rash     Outpatient Prescriptions Prior to Visit  Medication Sig Dispense Refill  . albuterol (PROVENTIL HFA;VENTOLIN HFA) 108 (90 BASE) MCG/ACT inhaler Inhale 2  puffs into the lungs every 6 (six) hours as needed for wheezing.      [name] ALPRAZolam (XANAX) 0.5 MG tablet Take 0.5 mg by mouth 2 (two) times daily as needed for anxiety.       [name] azelastine (ASTELIN) 137 MCG/SPRAY nasal spray Place 1 spray into the nose 2 (two) times daily. Use in each nostril as directed      . benzonatate (TESSALON) 100 MG capsule Take 100 mg by mouth 3 (three) times daily as needed for cough.      . budesonide-formoterol (SYMBICORT) 160-4.5 MCG/ACT inhaler Inhale 2 puffs into the lungs 2 (two) times daily.      . Calcium Carbonate-Vitamin D (CALTRATE 600+D) 600-400 MG-UNIT per tablet Take 1 tablet by mouth daily.       . chlorpheniramine (CHLOR-TRIMETON) 4 MG tablet Take 4 mg by mouth at bedtime.       . Cholecalciferol (VITAMIN D3) 1000 UNITS tablet Take 1,000 Units by mouth daily.        [name] dextromethorphan (DELSYM) 30 MG/5ML liquid Take 60 mg by mouth 2 (two) times daily as needed for cough.      . esomeprazole  (NEXIUM) 40 MG capsule Take 40 mg by mouth 2 (two) times daily.      . ferrous sulfate 325 (65 FE) MG tablet Take 1 tablet (325 mg total) by mouth 3 (three) times daily after meals.    3  . fexofenadine (ALLEGRA) 180 MG tablet Take 180 mg by mouth daily.        . furosemide (LASIX) 40 MG tablet Take 40 mg by mouth every morning.       . ibandronate (BONIVA) 150 MG tablet Take 150 mg by mouth every 30 (thirty) days. Take in the morning with a full glass of water, on an empty stomach, and do not take anything else by mouth or lie down for the next 30 min.      . mometasone (NASONEX) 50 MCG/ACT nasal spray Place 1 spray into the nose daily.       . montelukast (SINGULAIR) 10 MG tablet Take 10 mg by mouth at bedtime.      [name] omalizumab (XOLAIR) 150 MG injection Inject 150 mg into the skin every 14 (fourteen) days.       . potassium chloride (K-DUR,KLOR-CON) 10 MEQ tablet Take 20 mEq by mouth daily. Two tablets once dail      . pramipexole (MIRAPEX) 0.125 MG tablet Take 0.375 mg by mouth at bedtime.       . rosuvastatin (CRESTOR) 5 MG tablet Take 5 mg by mouth at bedtime.      [name] Spacer/Aero-Holding Chambers (AEROCHAMBER MV) inhaler by Other route. Use as instructed       . telmisartan (MICARDIS) 40 MG tablet Take 20 mg by mouth at bedtime.      . trolamine salicylate (ASPERCREME) 10 % cream Apply 1 application topically 2 (two) times daily as needed (pain).       . vitamin B-12 (CYANOCOBALAMIN) 500 MCG tablet Take 500 mcg by mouth daily.        [name] aspirin EC 325 MG tablet Take 1 tablet (325 mg total) by mouth 2 (two) times daily.  60 tablet  0  . docusate sodium 100 MG CAPS Take 100 mg by mouth 2 (two) times daily.  10 capsule  0  . polyethylene glycol (MIRALAX / GLYCOLAX) packet Take 17 g by mouth 2 (two) times daily.  14 each  0  . HYDROcodone-acetaminophen (NORCO) 7.5-325 MG per tablet Take 1-2 tablets by mouth every 4 (four) hours.  100 tablet  0  . mupirocin ointment (BACTROBAN) 2 % Apply topically  2 (two) times daily. In nostrils      . rivaroxaban (XARELTO) 10 MG TABS tablet Take 1 tablet (10 mg total) by mouth daily.  12 tablet  0  . tiZANidine (ZANAFLEX) 4 MG capsule Take 1 capsule (4 mg total) by mouth 3 (three) times daily. Muscle spasms  50 capsule  0   No facility-administered medications prior to visit.      Review of Systems  .Constitutional:   No  weight loss, night sweats,  Fevers, chills,  + fatigue, or  lassitude.  HEENT:   No headaches,  Difficulty swallowing,  Tooth/dental problems, or  Sore throat,                No sneezing, itching, ear ache,  +nasal congestion, post nasal drip,   CV:  No chest pain,  Orthopnea, PND, swelling in lower extremities, anasarca, dizziness, palpitations, syncope.   GI  No heartburn, indigestion, abdominal pain, nausea, vomiting, diarrhea, change in bowel habits, loss of appetite, bloody stools.   Resp:    No coughing up of blood.    No chest wall deformity  Skin: no rash or lesions.  GU: no dysuria, change in color of urine, no urgency or frequency.  No flank pain, no hematuria   MS:  No joint pain or swelling.  No decreased range of motion.  No back pain.  Psych:  No change in mood or affect. No depression or anxiety.  No memory loss.         Objective:   Physical Exam BP 144/68  Pulse 101  Temp(Src) 97.9 F (36.6 C) (Oral)  Ht 5\' 4"  (1.626 m)  Wt 154 lb 3.2 oz (69.945 kg)  BMI 26.46 kg/m2  SpO2 97%  Gen: Pleasant, well-nourished, in no distress,  normal affect  ENT: No lesions,  mouth clear,  oropharynx clear, + postnasal drip  Neck: No JVD, no TMG, no carotid bruits  Lungs: No use of accessory muscles, no dullness to percussion, distant BS, exp wheeze  Cardiovascular: RRR, heart sounds normal, no murmur or gallops, no peripheral edema  Abdomen: soft and NT, no HSM,  BS normal  Musculoskeletal: No deformities, no cyanosis or clubbing  Neuro: alert, non focal  Skin: Warm, no lesions or rashes        Assessment & Plan:   Severe persistent asthma Severe persistent asthma with flare Plan A depomedrol injection will be made 120mg  IM No change in other medications  Return 2  months     Updated Medication List Outpatient Encounter Prescriptions as of 11/15/2012  Medication Sig Dispense Refill  . albuterol (PROVENTIL HFA;VENTOLIN HFA) 108 (90 BASE) MCG/ACT inhaler Inhale 2 puffs into the lungs every 6 (six) hours as needed for wheezing.      [name] ALPRAZolam (XANAX) 0.5 MG tablet Take 0.5 mg by mouth 2 (two) times daily as needed for anxiety.       [name] aspirin EC 325 MG tablet Take 325 mg by mouth daily.      [name] azelastine (ASTELIN) 137 MCG/SPRAY nasal spray Place 1 spray into the nose 2 (two) times daily. Use in each nostril as directed      . benzonatate (TESSALON) 100 MG capsule Take 100 mg by mouth 3 (three) times daily as needed for cough.      [name]  budesonide-formoterol (SYMBICORT) 160-4.5 MCG/ACT inhaler Inhale 2 puffs into the lungs 2 (two) times daily.      . Calcium Carbonate-Vitamin D (CALTRATE 600+D) 600-400 MG-UNIT per tablet Take 1 tablet by mouth daily.       . chlorpheniramine (CHLOR-TRIMETON) 4 MG tablet Take 4 mg by mouth at bedtime.       . Cholecalciferol (VITAMIN D3) 1000 UNITS tablet Take 1,000 Units by mouth daily.        [name] dextromethorphan (DELSYM) 30 MG/5ML liquid Take 60 mg by mouth 2 (two) times daily as needed for cough.      Tery Sanfilippo Sodium (DSS) 100 MG CAPS Take 100 mg by mouth daily.      [name] esomeprazole (NEXIUM) 40 MG capsule Take 40 mg by mouth 2 (two) times daily.      . ferrous sulfate 325 (65 FE) MG tablet Take 1 tablet (325 mg total) by mouth 3 (three) times daily after meals.    3  . fexofenadine (ALLEGRA) 180 MG tablet Take 180 mg by mouth daily.        . furosemide (LASIX) 40 MG tablet Take 40 mg by mouth every morning.       . ibandronate (BONIVA) 150 MG tablet Take 150 mg by mouth every 30 (thirty) days. Take in the morning with a full glass of water,  on an empty stomach, and do not take anything else by mouth or lie down for the next 30 min.      . mometasone (NASONEX) 50 MCG/ACT nasal spray Place 1 spray into the nose daily.       . montelukast (SINGULAIR) 10 MG tablet Take 10 mg by mouth at bedtime.      [name] omalizumab (XOLAIR) 150 MG injection Inject 150 mg into the skin every 14 (fourteen) days.       . polyethylene glycol (MIRALAX / GLYCOLAX) packet Take 17 g by mouth as needed.      . potassium chloride (K-DUR,KLOR-CON) 10 MEQ tablet Take 20 mEq by mouth daily. Two tablets once dail      . pramipexole (MIRAPEX) 0.125 MG tablet Take 0.375 mg by mouth at bedtime.       . rosuvastatin (CRESTOR) 5 MG tablet Take 5 mg by mouth at bedtime.      [name] Spacer/Aero-Holding Chambers (AEROCHAMBER MV) inhaler by Other route. Use as instructed       . telmisartan (MICARDIS) 40 MG tablet Take 20 mg by mouth at bedtime.      . trolamine salicylate (ASPERCREME) 10 % cream Apply 1 application topically 2 (two) times daily as needed (pain).       . vitamin B-12 (CYANOCOBALAMIN) 500 MCG tablet Take 500 mcg by mouth daily.        . [DISCONTINUED] aspirin EC 325 MG tablet Take 1 tablet (325 mg total) by mouth 2 (two) times daily.  60 tablet  0  . [DISCONTINUED] docusate sodium 100 MG CAPS Take 100 mg by mouth 2 (two) times daily.  10 capsule  0  . [DISCONTINUED] polyethylene glycol (MIRALAX / GLYCOLAX) packet Take 17 g by mouth 2 (two) times daily.  14 each  0  . HYDROcodone-acetaminophen (NORCO) 7.5-325 MG per tablet Take 1-2 tablets by mouth every 4 (four) hours.  100 tablet  0  . [DISCONTINUED] mupirocin ointment (BACTROBAN) 2 % Apply topically 2 (two) times daily. In nostrils      . [DISCONTINUED] rivaroxaban (XARELTO) 10 MG TABS tablet Take 1 tablet (  10 mg total) by mouth daily.  12 tablet  0  . [DISCONTINUED] tiZANidine (ZANAFLEX) 4 MG capsule Take 1 capsule (4 mg total) by mouth 3 (three) times daily. Muscle spasms  50 capsule  0  . [EXPIRED]  methylPREDNISolone acetate (DEPO-MEDROL) injection 120 mg        No facility-administered encounter medications on file as of 11/15/2012.

## 2012-11-15 NOTE — Patient Instructions (Addendum)
A depomedrol injection will be made 120mg  IM No change in other medications  Return 2  months

## 2012-11-16 ENCOUNTER — Other Ambulatory Visit: Payer: Self-pay | Admitting: Critical Care Medicine

## 2012-11-16 DIAGNOSIS — M25569 Pain in unspecified knee: Secondary | ICD-10-CM | POA: Diagnosis not present

## 2012-11-17 MED ORDER — OMALIZUMAB 150 MG ~~LOC~~ SOLR
300.0000 mg | Freq: Once | SUBCUTANEOUS | Status: AC
  Administered 2012-11-17: 300 mg via SUBCUTANEOUS

## 2012-11-18 DIAGNOSIS — M25569 Pain in unspecified knee: Secondary | ICD-10-CM | POA: Diagnosis not present

## 2012-11-23 DIAGNOSIS — M25569 Pain in unspecified knee: Secondary | ICD-10-CM | POA: Diagnosis not present

## 2012-11-24 DIAGNOSIS — M25569 Pain in unspecified knee: Secondary | ICD-10-CM | POA: Diagnosis not present

## 2012-11-29 ENCOUNTER — Ambulatory Visit (INDEPENDENT_AMBULATORY_CARE_PROVIDER_SITE_OTHER): Payer: Medicare Other

## 2012-11-29 DIAGNOSIS — J45909 Unspecified asthma, uncomplicated: Secondary | ICD-10-CM | POA: Diagnosis not present

## 2012-11-30 DIAGNOSIS — M25569 Pain in unspecified knee: Secondary | ICD-10-CM | POA: Diagnosis not present

## 2012-12-02 MED ORDER — OMALIZUMAB 150 MG ~~LOC~~ SOLR
300.0000 mg | Freq: Once | SUBCUTANEOUS | Status: AC
  Administered 2012-12-02: 300 mg via SUBCUTANEOUS

## 2012-12-03 DIAGNOSIS — M25569 Pain in unspecified knee: Secondary | ICD-10-CM | POA: Diagnosis not present

## 2012-12-06 DIAGNOSIS — M25569 Pain in unspecified knee: Secondary | ICD-10-CM | POA: Diagnosis not present

## 2012-12-10 DIAGNOSIS — M25569 Pain in unspecified knee: Secondary | ICD-10-CM | POA: Diagnosis not present

## 2012-12-13 ENCOUNTER — Ambulatory Visit (INDEPENDENT_AMBULATORY_CARE_PROVIDER_SITE_OTHER): Payer: Medicare Other

## 2012-12-13 DIAGNOSIS — J45909 Unspecified asthma, uncomplicated: Secondary | ICD-10-CM | POA: Diagnosis not present

## 2012-12-13 DIAGNOSIS — M25569 Pain in unspecified knee: Secondary | ICD-10-CM | POA: Diagnosis not present

## 2012-12-16 DIAGNOSIS — M25569 Pain in unspecified knee: Secondary | ICD-10-CM | POA: Diagnosis not present

## 2012-12-16 MED ORDER — OMALIZUMAB 150 MG ~~LOC~~ SOLR
300.0000 mg | Freq: Once | SUBCUTANEOUS | Status: AC
  Administered 2012-12-16: 300 mg via SUBCUTANEOUS

## 2012-12-27 ENCOUNTER — Ambulatory Visit (INDEPENDENT_AMBULATORY_CARE_PROVIDER_SITE_OTHER): Payer: Medicare Other

## 2012-12-27 DIAGNOSIS — J45909 Unspecified asthma, uncomplicated: Secondary | ICD-10-CM

## 2012-12-30 DIAGNOSIS — Z96659 Presence of unspecified artificial knee joint: Secondary | ICD-10-CM | POA: Diagnosis not present

## 2013-01-03 MED ORDER — OMALIZUMAB 150 MG ~~LOC~~ SOLR
300.0000 mg | Freq: Once | SUBCUTANEOUS | Status: AC
  Administered 2013-01-03: 300 mg via SUBCUTANEOUS

## 2013-01-12 ENCOUNTER — Ambulatory Visit (INDEPENDENT_AMBULATORY_CARE_PROVIDER_SITE_OTHER): Payer: Medicare Other | Admitting: Critical Care Medicine

## 2013-01-12 ENCOUNTER — Ambulatory Visit (INDEPENDENT_AMBULATORY_CARE_PROVIDER_SITE_OTHER): Payer: Medicare Other

## 2013-01-12 ENCOUNTER — Encounter: Payer: Self-pay | Admitting: Critical Care Medicine

## 2013-01-12 VITALS — BP 132/76 | HR 75 | Temp 97.7°F | Ht 64.0 in | Wt 159.0 lb

## 2013-01-12 DIAGNOSIS — J45909 Unspecified asthma, uncomplicated: Secondary | ICD-10-CM | POA: Diagnosis not present

## 2013-01-12 DIAGNOSIS — J4550 Severe persistent asthma, uncomplicated: Secondary | ICD-10-CM

## 2013-01-12 MED ORDER — AZELASTINE HCL 0.1 % NA SOLN: 2.0000 | Freq: Two times a day (BID) | NASAL | Status: DC

## 2013-01-12 MED ORDER — CHLORPHENIRAMINE MALEATE 4 MG PO TABS: 8.0000 mg | ORAL_TABLET | Freq: Every day | ORAL | Status: DC

## 2013-01-12 MED ORDER — MOMETASONE FUROATE 50 MCG/ACT NA SUSP: 2.0000 | Freq: Two times a day (BID) | NASAL | Status: DC

## 2013-01-12 MED ORDER — METHYLPREDNISOLONE ACETATE 80 MG/ML IJ SUSP
120.0000 mg | Freq: Once | INTRAMUSCULAR | Status: AC
  Administered 2013-01-12: 120 mg via INTRAMUSCULAR

## 2013-01-12 NOTE — Progress Notes (Signed)
Subjective:    Patient ID: [name], female    DOB: Sep 13, 1932, 80 y.o.   MRN: [id number]  HPI 79 y.o. Moderate persistent asthma with severe atopy  01/12/2013 Chief Complaint  Patient presents with  . 2 month follow up    c/o prod cough with clear mucus, PND, HAs, increased SOB at times, wheezing, and chest tightness.  Symptoms have worsened with weather change.  No f/c/s.  Pt with chronic cough and pndrip.  H/As increased.   On nasonex /astelin but only daily.  Pt notes more wheezing Ongoing severe post nasal drainage and dry cough is persisting. Nighttime symptoms are continuing.    PUL ASTHMA HISTORY 01/12/2013 11/15/2012 09/29/2012 07/22/2012 07/21/2012  Symptoms Daily Daily 0-2 days/week Daily Daily  Nighttime awakenings Often--7/wk 0-2/month 0-2/month 3-4/month 3-4/month  Interference with activity Extreme limitations Some limitations Minor limitations Some limitations Some limitations  SABA use 0-2 days/wk > 2 days/wk--not > 1 x/day 0-2 days/wk Daily Daily  Exacerbations requiring oral steroids 0-1 / year 2 or more / year 0-1 / year 2 or more / year -      Past Medical History  Diagnosis Date  . Restless legs syndrome (RLS)   . Osteoporosis, unspecified     Knee and hip osteoarthritis bilaterally  . Unspecified essential hypertension   . Other diseases of vocal cords   . Unspecified asthma(493.90)   . Esophageal reflux   . Allergic rhinitis, cause unspecified   . Obstructive chronic bronchitis without exacerbation   . Hyperlipidemia   . Gallbladder disease   . Degenerative disk disease   . Perennial allergic rhinitis   . Diverticulosis   . Lymphedema     in the right arm  . Osteopenia   . Shingles 2007  . Chronic kidney disease, stage II (mild)   . Dysrhythmia     "skips a beat sometimes"  . Pneumonia     x2  . Cancer     right breast  . Blood clot in vein     behind right knee     Family History  Problem Relation Age of Onset  . Heart attack Mother    . Heart attack Father   . Cancer Brother   . Asthma Brother   . Coronary artery disease Sister   . Asthma Mother   . Heart failure Father      History   Social History  . Marital Status: Married    Spouse Name: N/A    Number of Children: N/A  . Years of Education: N/A   Occupational History  . Not on file.   Social History Main Topics  . Smoking status: Never Smoker   . Smokeless tobacco: Never Used  . Alcohol Use: No  . Drug Use: No  . Sexual Activity: Not on file   Other Topics Concern  . Not on file   Social History Narrative  . No narrative on file     Allergies  Allergen Reactions  . Cephalosporins     REACTION: rash  . Codeine     REACTION: rash  . Eggs Or Egg-Derived Products   . Levofloxacin     REACTION: rash  . Penicillins     REACTION: rash  . Pneumococcal Vaccines Rash     Outpatient Prescriptions Prior to Visit  Medication Sig Dispense Refill  . albuterol (PROVENTIL HFA;VENTOLIN HFA) 108 (90 BASE) MCG/ACT inhaler Inhale 2 puffs into the lungs every 6 (six) hours as needed for wheezing.      [name]  ALPRAZolam (XANAX) 0.5 MG tablet Take 0.5 mg by mouth 2 (two) times daily as needed for anxiety.       [name] aspirin EC 325 MG tablet Take 325 mg by mouth daily.      . benzonatate (TESSALON) 100 MG capsule Take 100 mg by mouth 3 (three) times daily as needed for cough.      . budesonide-formoterol (SYMBICORT) 160-4.5 MCG/ACT inhaler Inhale 2 puffs into the lungs 2 (two) times daily.      . Calcium Carbonate-Vitamin D (CALTRATE 600+D) 600-400 MG-UNIT per tablet Take 1 tablet by mouth daily.       . Cholecalciferol (VITAMIN D3) 1000 UNITS tablet Take 1,000 Units by mouth daily.        [name] dextromethorphan (DELSYM) 30 MG/5ML liquid Take 60 mg by mouth 2 (two) times daily as needed for cough.      . fexofenadine (ALLEGRA) 180 MG tablet Take 180 mg by mouth daily.        . furosemide (LASIX) 40 MG tablet Take 40 mg by mouth every morning.       [name]  HYDROcodone-acetaminophen (NORCO) 7.5-325 MG per tablet Take 1-2 tablets by mouth every 4 (four) hours.  100 tablet  0  . ibandronate (BONIVA) 150 MG tablet Take 150 mg by mouth every 30 (thirty) days. Take in the morning with a full glass of water, on an empty stomach, and do not take anything else by mouth or lie down for the next 30 min.      . montelukast (SINGULAIR) 10 MG tablet Take 10 mg by mouth at bedtime.      [name] NEXIUM 40 MG capsule TAKE 1 CAPSULE TWICE A DAY  180 capsule  2  . omalizumab (XOLAIR) 150 MG injection Inject 150 mg into the skin every 14 (fourteen) days.       . polyethylene glycol (MIRALAX / GLYCOLAX) packet Take 17 g by mouth as needed.      . potassium chloride (K-DUR,KLOR-CON) 10 MEQ tablet Take 20 mEq by mouth daily. Two tablets once dail      . pramipexole (MIRAPEX) 0.125 MG tablet Take 0.375 mg by mouth at bedtime.       . rosuvastatin (CRESTOR) 5 MG tablet Take 5 mg by mouth at bedtime.      [name] Spacer/Aero-Holding Chambers (AEROCHAMBER MV) inhaler by Other route. Use as instructed       . telmisartan (MICARDIS) 40 MG tablet Take 20 mg by mouth at bedtime.      . trolamine salicylate (ASPERCREME) 10 % cream Apply 1 application topically 2 (two) times daily as needed (pain).       . vitamin B-12 (CYANOCOBALAMIN) 500 MCG tablet Take 500 mcg by mouth daily.        [name] azelastine (ASTELIN) 137 MCG/SPRAY nasal spray Place 1 spray into the nose 2 (two) times daily. Use in each nostril as directed      . chlorpheniramine (CHLOR-TRIMETON) 4 MG tablet Take 4 mg by mouth at bedtime.       [name] esomeprazole (NEXIUM) 40 MG capsule Take 40 mg by mouth 2 (two) times daily.      . mometasone (NASONEX) 50 MCG/ACT nasal spray Place 1 spray into the nose daily.       Tery Sanfilippo Sodium (DSS) 100 MG CAPS Take 100 mg by mouth daily.      . ferrous sulfate 325 (65 FE) MG tablet Take 1 tablet (325 mg total) by mouth 3 (  three) times daily after meals.    3   No facility-administered medications  prior to visit.      Review of Systems  .Constitutional:   No  weight loss, night sweats,  Fevers, chills,  + fatigue, or  lassitude.  HEENT:   No headaches,  Difficulty swallowing,  Tooth/dental problems, or  Sore throat,                No sneezing, itching, ear ache,  +nasal congestion, post nasal drip,   CV:  No chest pain,  Orthopnea, PND, swelling in lower extremities, anasarca, dizziness, palpitations, syncope.   GI  No heartburn, indigestion, abdominal pain, nausea, vomiting, diarrhea, change in bowel habits, loss of appetite, bloody stools.   Resp:    No coughing up of blood.    No chest wall deformity  Skin: no rash or lesions.  GU: no dysuria, change in color of urine, no urgency or frequency.  No flank pain, no hematuria   MS:  No joint pain or swelling.  No decreased range of motion.  No back pain.  Psych:  No change in mood or affect. No depression or anxiety.  No memory loss.         Objective:   Physical Exam BP 132/76  Pulse 75  Temp(Src) 97.7 F (36.5 C) (Oral)  Ht 5\' 4"  (1.626 m)  Wt 159 lb (72.122 kg)  BMI 27.28 kg/m2  SpO2 100%  Gen: Pleasant, well-nourished, in no distress,  normal affect  ENT: No lesions,  mouth clear,  oropharynx clear, +++ postnasal drip  Neck: No JVD, no TMG, no carotid bruits  Lungs: No use of accessory muscles, no dullness to percussion, distant BS, exp wheeze  Cardiovascular: RRR, heart sounds normal, no murmur or gallops, no peripheral edema  Abdomen: soft and NT, no HSM,  BS normal  Musculoskeletal: No deformities, no cyanosis or clubbing  Neuro: alert, non focal  Skin: Warm, no lesions or rashes       Assessment & Plan:   Severe persistent asthma Severe persistent asthma with acute flare due to to significant allergic rhinitis and atopic features Plan Maintain Symbicort twice daily Increase Nasonex and Astelin to 2 puff each nostril twice daily A Depo-Medrol injection 120 mg IM was given Note the  patient refused a flu vaccine and Pneumovax    Updated Medication List Outpatient Encounter Prescriptions as of 01/12/2013  Medication Sig Dispense Refill  . albuterol (PROVENTIL HFA;VENTOLIN HFA) 108 (90 BASE) MCG/ACT inhaler Inhale 2 puffs into the lungs every 6 (six) hours as needed for wheezing.      [name] ALPRAZolam (XANAX) 0.5 MG tablet Take 0.5 mg by mouth 2 (two) times daily as needed for anxiety.       [name] aspirin EC 325 MG tablet Take 325 mg by mouth daily.      [name] azelastine (ASTELIN) 137 MCG/SPRAY nasal spray Place 2 sprays into the nose 2 (two) times daily. Use in each nostril as directed  30 mL    . benzonatate (TESSALON) 100 MG capsule Take 100 mg by mouth 3 (three) times daily as needed for cough.      . budesonide-formoterol (SYMBICORT) 160-4.5 MCG/ACT inhaler Inhale 2 puffs into the lungs 2 (two) times daily.      . Calcium Carbonate-Vitamin D (CALTRATE 600+D) 600-400 MG-UNIT per tablet Take 1 tablet by mouth daily.       . chlorpheniramine (CHLOR-TRIMETON) 4 MG tablet Take 2 tablets (8 mg total) by  mouth at bedtime.  14 tablet    . Cholecalciferol (VITAMIN D3) 1000 UNITS tablet Take 1,000 Units by mouth daily.        [name] dextromethorphan (DELSYM) 30 MG/5ML liquid Take 60 mg by mouth 2 (two) times daily as needed for cough.      . fexofenadine (ALLEGRA) 180 MG tablet Take 180 mg by mouth daily.        . furosemide (LASIX) 40 MG tablet Take 40 mg by mouth every morning.       [name] HYDROcodone-acetaminophen (NORCO) 7.5-325 MG per tablet Take 1-2 tablets by mouth every 4 (four) hours.  100 tablet  0  . ibandronate (BONIVA) 150 MG tablet Take 150 mg by mouth every 30 (thirty) days. Take in the morning with a full glass of water, on an empty stomach, and do not take anything else by mouth or lie down for the next 30 min.      . mometasone (NASONEX) 50 MCG/ACT nasal spray Place 2 sprays into the nose 2 (two) times daily.  17 g    . montelukast (SINGULAIR) 10 MG tablet Take 10 mg by mouth at  bedtime.      [name] NEXIUM 40 MG capsule TAKE 1 CAPSULE TWICE A DAY  180 capsule  2  . omalizumab (XOLAIR) 150 MG injection Inject 150 mg into the skin every 14 (fourteen) days.       . polyethylene glycol (MIRALAX / GLYCOLAX) packet Take 17 g by mouth as needed.      . potassium chloride (K-DUR,KLOR-CON) 10 MEQ tablet Take 20 mEq by mouth daily. Two tablets once dail      . pramipexole (MIRAPEX) 0.125 MG tablet Take 0.375 mg by mouth at bedtime.       . rosuvastatin (CRESTOR) 5 MG tablet Take 5 mg by mouth at bedtime.      [name] Spacer/Aero-Holding Chambers (AEROCHAMBER MV) inhaler by Other route. Use as instructed       . telmisartan (MICARDIS) 40 MG tablet Take 20 mg by mouth at bedtime.      . traMADol (ULTRAM) 50 MG tablet Take 1 tablet by mouth at bedtime.      . trolamine salicylate (ASPERCREME) 10 % cream Apply 1 application topically 2 (two) times daily as needed (pain).       . vitamin B-12 (CYANOCOBALAMIN) 500 MCG tablet Take 500 mcg by mouth daily.        . [DISCONTINUED] azelastine (ASTELIN) 137 MCG/SPRAY nasal spray Place 1 spray into the nose 2 (two) times daily. Use in each nostril as directed      . [DISCONTINUED] chlorpheniramine (CHLOR-TRIMETON) 4 MG tablet Take 4 mg by mouth at bedtime.       . [DISCONTINUED] esomeprazole (NEXIUM) 40 MG capsule Take 40 mg by mouth 2 (two) times daily.      . [DISCONTINUED] mometasone (NASONEX) 50 MCG/ACT nasal spray Place 1 spray into the nose daily.       . [DISCONTINUED] Docusate Sodium (DSS) 100 MG CAPS Take 100 mg by mouth daily.      . [DISCONTINUED] ferrous sulfate 325 (65 FE) MG tablet Take 1 tablet (325 mg total) by mouth 3 (three) times daily after meals.    3   No facility-administered encounter medications on file as of 01/12/2013.

## 2013-01-12 NOTE — Assessment & Plan Note (Signed)
Severe persistent asthma with acute flare due to to significant allergic rhinitis and atopic features Plan Maintain Symbicort twice daily Increase Nasonex and Astelin to 2 puff each nostril twice daily A Depo-Medrol injection 120 mg IM was given Note the patient refused a flu vaccine and Pneumovax

## 2013-01-12 NOTE — Patient Instructions (Addendum)
Increase nasonex and astelin to two puff ea nostril twice daily Increase chlorpheniramine 8mg  at bedtime Use mucinex DM twice daily for cough A depomedrol 120mg  injection was given Return 2 months You declined flu and pneumovax

## 2013-01-14 MED ORDER — OMALIZUMAB 150 MG ~~LOC~~ SOLR
300.0000 mg | Freq: Once | SUBCUTANEOUS | Status: AC
  Administered 2013-01-14: 300 mg via SUBCUTANEOUS

## 2013-01-26 ENCOUNTER — Ambulatory Visit (INDEPENDENT_AMBULATORY_CARE_PROVIDER_SITE_OTHER): Payer: Medicare Other

## 2013-01-26 DIAGNOSIS — J45909 Unspecified asthma, uncomplicated: Secondary | ICD-10-CM | POA: Diagnosis not present

## 2013-01-26 DIAGNOSIS — J4550 Severe persistent asthma, uncomplicated: Secondary | ICD-10-CM

## 2013-01-26 MED ORDER — OMALIZUMAB 150 MG ~~LOC~~ SOLR
300.0000 mg | Freq: Once | SUBCUTANEOUS | Status: AC
  Administered 2013-01-26: 300 mg via SUBCUTANEOUS

## 2013-02-08 DIAGNOSIS — R7301 Impaired fasting glucose: Secondary | ICD-10-CM | POA: Diagnosis not present

## 2013-02-08 DIAGNOSIS — L989 Disorder of the skin and subcutaneous tissue, unspecified: Secondary | ICD-10-CM | POA: Diagnosis not present

## 2013-02-08 DIAGNOSIS — Z6827 Body mass index (BMI) 27.0-27.9, adult: Secondary | ICD-10-CM | POA: Diagnosis not present

## 2013-02-08 DIAGNOSIS — I10 Essential (primary) hypertension: Secondary | ICD-10-CM | POA: Diagnosis not present

## 2013-02-08 DIAGNOSIS — J45909 Unspecified asthma, uncomplicated: Secondary | ICD-10-CM | POA: Diagnosis not present

## 2013-02-09 ENCOUNTER — Ambulatory Visit (INDEPENDENT_AMBULATORY_CARE_PROVIDER_SITE_OTHER): Payer: Medicare Other

## 2013-02-09 ENCOUNTER — Telehealth: Payer: Self-pay | Admitting: Critical Care Medicine

## 2013-02-09 DIAGNOSIS — J4550 Severe persistent asthma, uncomplicated: Secondary | ICD-10-CM

## 2013-02-09 DIAGNOSIS — J45909 Unspecified asthma, uncomplicated: Secondary | ICD-10-CM

## 2013-02-09 MED ORDER — ALBUTEROL SULFATE HFA 108 (90 BASE) MCG/ACT IN AERS: 2.0000 | INHALATION_SPRAY | Freq: Four times a day (QID) | RESPIRATORY_TRACT | Status: DC | PRN

## 2013-02-09 MED ORDER — AZELASTINE HCL 0.1 % NA SOLN: 2.0000 | Freq: Two times a day (BID) | NASAL | Status: DC

## 2013-02-09 MED ORDER — OMALIZUMAB 150 MG ~~LOC~~ SOLR
300.0000 mg | Freq: Once | SUBCUTANEOUS | Status: AC
  Administered 2013-02-09: 300 mg via SUBCUTANEOUS

## 2013-02-09 NOTE — Telephone Encounter (Signed)
LMTCB x1 for pt.  

## 2013-02-09 NOTE — Telephone Encounter (Signed)
I spoke with pt. Confirmed RX's needed to be sent. Nothing further needed and rx sent.

## 2013-02-10 DIAGNOSIS — D1801 Hemangioma of skin and subcutaneous tissue: Secondary | ICD-10-CM | POA: Diagnosis not present

## 2013-02-10 DIAGNOSIS — L02419 Cutaneous abscess of limb, unspecified: Secondary | ICD-10-CM | POA: Diagnosis not present

## 2013-02-10 DIAGNOSIS — L821 Other seborrheic keratosis: Secondary | ICD-10-CM | POA: Diagnosis not present

## 2013-02-10 DIAGNOSIS — L0291 Cutaneous abscess, unspecified: Secondary | ICD-10-CM | POA: Diagnosis not present

## 2013-02-17 ENCOUNTER — Telehealth: Payer: Self-pay | Admitting: Critical Care Medicine

## 2013-02-17 ENCOUNTER — Ambulatory Visit (INDEPENDENT_AMBULATORY_CARE_PROVIDER_SITE_OTHER): Payer: Medicare Other | Admitting: Adult Health

## 2013-02-17 ENCOUNTER — Encounter: Payer: Self-pay | Admitting: Adult Health

## 2013-02-17 VITALS — BP 130/78 | HR 84 | Temp 98.9°F | Ht 65.0 in | Wt 161.4 lb

## 2013-02-17 DIAGNOSIS — J309 Allergic rhinitis, unspecified: Secondary | ICD-10-CM | POA: Diagnosis not present

## 2013-02-17 DIAGNOSIS — J45909 Unspecified asthma, uncomplicated: Secondary | ICD-10-CM | POA: Diagnosis not present

## 2013-02-17 DIAGNOSIS — J4550 Severe persistent asthma, uncomplicated: Secondary | ICD-10-CM

## 2013-02-17 MED ORDER — METHYLPREDNISOLONE ACETATE 80 MG/ML IJ SUSP
120.0000 mg | Freq: Once | INTRAMUSCULAR | Status: AC
  Administered 2013-02-17: 120 mg via INTRAMUSCULAR

## 2013-02-17 NOTE — Patient Instructions (Signed)
Continue nasonex and astelin to two puff ea nostril twice daily Continue on chlorpheniramine 8mg  at bedtime Add Chlorpheniramine 4mg  daily in am.  Use mucinex DM twice daily for cough A depomedrol 120mg  injection was given Continue with saline nasal rinses.  follow up with Dr. Delford Field  As planned and As needed   Please contact office for sooner follow up if symptoms do not improve or worsen or seek emergency care

## 2013-02-17 NOTE — Telephone Encounter (Signed)
Last OV 01-12-13. Pt states she is having sinus congestion, runny nose, productive cough. Pt is requesting to come in for a depo injection because this always helps her. Pt staets she is taking all meds as prescribed. Appt set today at 11:45. Carron Curie, CMA

## 2013-02-22 ENCOUNTER — Encounter: Payer: Self-pay | Admitting: Cardiovascular Disease

## 2013-02-23 ENCOUNTER — Ambulatory Visit (INDEPENDENT_AMBULATORY_CARE_PROVIDER_SITE_OTHER): Payer: Medicare Other

## 2013-02-23 DIAGNOSIS — J4550 Severe persistent asthma, uncomplicated: Secondary | ICD-10-CM

## 2013-02-23 DIAGNOSIS — J45909 Unspecified asthma, uncomplicated: Secondary | ICD-10-CM

## 2013-02-23 MED ORDER — OMALIZUMAB 150 MG ~~LOC~~ SOLR
300.0000 mg | Freq: Once | SUBCUTANEOUS | Status: AC
  Administered 2013-02-23: 300 mg via SUBCUTANEOUS

## 2013-02-23 NOTE — Progress Notes (Signed)
  Subjective:    Patient ID: [name], female    DOB: 05-25-1932, 79 y.o.   MRN: [id number]  HPI 79 yo with moderate persistent asthma with severe atopy   02/17/13 Acute OV  Complains dry coughing, clear runny nose since 10/7.  Pt is requesting depo shot.  No fever, discolored mucus, chest pain , orthopnea, edema , recent travel or abx use.  Has drippy nose and nasal drainage in throat.  Has been using nasonex and astelin without much help.    Review of Systems Constitutional:   No  weight loss, night sweats,  Fevers, chills, fatigue, or  lassitude.  HEENT:   No headaches,  Difficulty swallowing,  Tooth/dental problems, or  Sore throat,                No sneezing, itching, ear ache,  +nasal congestion, post nasal drip,   CV:  No chest pain,  Orthopnea, PND, swelling in lower extremities, anasarca, dizziness, palpitations, syncope.   GI  No heartburn, indigestion, abdominal pain, nausea, vomiting, diarrhea, change in bowel habits, loss of appetite, bloody stools.   Resp:  No chest wall deformity  Skin: no rash or lesions.  GU: no dysuria, change in color of urine, no urgency or frequency.  No flank pain, no hematuria   MS:  No joint pain or swelling.  No decreased range of motion.  No back pain.  Psych:  No change in mood or affect. No depression or anxiety.  No memory loss.    '     Objective:   Physical Exam GEN: A/Ox3; pleasant , NAD, well nourished   HEENT:  [place]/AT,  EACs-clear, TMs-wnl, NOSE-clear drainage  THROAT-clear, no lesions, no postnasal drip or exudate noted.   NECK:  Supple w/ fair ROM; no JVD; normal carotid impulses w/o bruits; no thyromegaly or nodules palpated; no lymphadenopathy.  RESP  Clear  P & A; w/o, wheezes/ rales/ or rhonchi.no accessory muscle use, no dullness to percussion  CARD:  RRR, no m/r/g  , no peripheral edema, pulses intact, no cyanosis or clubbing.  GI:   Soft & nt; nml bowel sounds; no organomegaly or masses  detected.  Musco: Warm bil, no deformities or joint swelling noted.   Neuro: alert, no focal deficits noted.    Skin: Warm, no lesions or rashes         Assessment & Plan:

## 2013-02-23 NOTE — Assessment & Plan Note (Signed)
Mild flare with AR  Steroid discussion   PLAN Continue nasonex and astelin to two puff ea nostril twice daily Continue on chlorpheniramine 8mg  at bedtime Add Chlorpheniramine 4mg  daily in am.  Use mucinex DM twice daily for cough A depomedrol 120mg  injection was given Continue with saline nasal rinses.  follow up with Dr. Delford Field  As planned and As needed   Please contact office for sooner follow up if symptoms do not improve or worsen or seek emergency care

## 2013-03-03 ENCOUNTER — Encounter: Payer: Self-pay | Admitting: Cardiovascular Disease

## 2013-03-03 ENCOUNTER — Ambulatory Visit (INDEPENDENT_AMBULATORY_CARE_PROVIDER_SITE_OTHER): Payer: Medicare Other | Admitting: Cardiovascular Disease

## 2013-03-03 VITALS — BP 120/66 | HR 73 | Ht 65.0 in | Wt 158.0 lb

## 2013-03-03 DIAGNOSIS — E785 Hyperlipidemia, unspecified: Secondary | ICD-10-CM | POA: Diagnosis not present

## 2013-03-03 DIAGNOSIS — I10 Essential (primary) hypertension: Secondary | ICD-10-CM

## 2013-03-03 LAB — HEPATIC FUNCTION PANEL
ALT: 26 U/L (ref 0–35)
AST: 25 U/L (ref 0–37)
Albumin: 3.5 g/dL (ref 3.5–5.2)
Total Protein: 7.1 g/dL (ref 6.0–8.3)

## 2013-03-03 LAB — LIPID PANEL
HDL: 108.9 mg/dL (ref >39.00)
Triglycerides: 52 mg/dL (ref 0.0–149.0)

## 2013-03-03 LAB — BASIC METABOLIC PANEL
CO2: 33 mEq/L — ABNORMAL HIGH (ref 19–32)
Calcium: 9 mg/dL (ref 8.4–10.5)
GFR: 40.64 mL/min — ABNORMAL LOW (ref >60.00)
Potassium: 3.2 mEq/L — ABNORMAL LOW (ref 3.5–5.1)
Sodium: 140 mEq/L (ref 135–145)

## 2013-03-03 MED ORDER — ROSUVASTATIN CALCIUM 5 MG PO TABS: 5.0000 mg | ORAL_TABLET | Freq: Every day | ORAL | Status: DC

## 2013-03-03 NOTE — Assessment & Plan Note (Signed)
We'll refill her Crestor today. We'll check fasting labs today.

## 2013-03-03 NOTE — Patient Instructions (Signed)
Your physician wants you to follow-up in: 1 YEAR  You will receive a reminder letter in the mail two months in advance. If you don't receive a letter, please call our office to schedule the follow-up appointment.  Your physician recommends that you return for a FASTING lipid profile: TODAY AND IN 1 YEAR  Your physician recommends that you continue on your current medications as directed. Please refer to the Current Medication list given to you today.   

## 2013-03-03 NOTE — Assessment & Plan Note (Signed)
>>  ASSESSMENT AND PLAN FOR HYPERTENSIVE HEART DISEASE WITHOUT CHF WRITTEN ON 03/03/2013 10:57 AM BY NAHSER, PHILIP J, MD  Her blood pressure is well-controlled. Continue same medications.

## 2013-03-03 NOTE — Progress Notes (Signed)
[name] Bartolo Date of Birth  [birth year]       [facility]    [place] [address]  [address] [place], [place]  [address]   [place], [place]  [address] [phone]     [phone]   Fax  [phone]    Fax [phone]  Problem List: 1. History of chest pain-normal coronary arteries by heart catheterization in August, 2008 2. History of breast cancer-status post right mastectomy and 1989-status post left mastectomy in 1990 3. Hypertension 4. Hyperlipidemia 5. Asthma  History of Present Illness:  [name] has had lots of problems with her asthma this summer.  She's not had any cardiac complaints. She denies any chest pain. She's not been able to exercise much of the summer because her asthma problems.  Oct. 23, 2014:  She has had a total knee replacement since I saw her.   She is back exercising some - rehab for her knee.  Has lost a few lbs.   Current Outpatient Prescriptions on File Prior to Visit  Medication Sig Dispense Refill  . albuterol (PROVENTIL HFA;VENTOLIN HFA) 108 (90 BASE) MCG/ACT inhaler Inhale 2 puffs into the lungs every 6 (six) hours as needed.  3 Inhaler  1  . ALPRAZolam (XANAX) 0.5 MG tablet Take 0.5 mg by mouth 2 (two) times daily as needed for anxiety.       [name] aspirin EC 325 MG tablet Take 81 mg by mouth daily.       [name] azelastine (ASTELIN) 137 MCG/SPRAY nasal spray Place 2 sprays into the nose 2 (two) times daily. Use in each nostril as directed  90 mL  1  . benzonatate (TESSALON) 100 MG capsule Take 100 mg by mouth 3 (three) times daily as needed for cough.      . budesonide-formoterol (SYMBICORT) 160-4.5 MCG/ACT inhaler Inhale 2 puffs into the lungs 2 (two) times daily.      . Calcium Carbonate-Vitamin D (CALTRATE 600+D) 600-400 MG-UNIT per tablet Take 1 tablet by mouth daily.       . chlorpheniramine (CHLOR-TRIMETON) 4 MG tablet Take 2 tablets (8 mg total) by mouth at bedtime.  14 tablet    .  Cholecalciferol (VITAMIN D3) 1000 UNITS tablet Take 1,000 Units by mouth daily.        [name] dextromethorphan (DELSYM) 30 MG/5ML liquid Take 60 mg by mouth 2 (two) times daily as needed for cough.      . dextrose 5 % SOLN 100 mL with clindamycin 900 MG/6ML SOLN 900 mg Inject 900 mg into the vein 3 (three) times daily.      . fexofenadine (ALLEGRA) 180 MG tablet Take 180 mg by mouth daily.        . furosemide (LASIX) 40 MG tablet Take 40 mg by mouth every morning.       . ibandronate (BONIVA) 150 MG tablet Take 150 mg by mouth every 30 (thirty) days. Take in the morning with a full glass of water, on an empty stomach, and do not take anything else by mouth or lie down for the next 30 min.      . mometasone (NASONEX) 50 MCG/ACT nasal spray Place 2 sprays into the nose 2 (two) times daily.  17 g    . montelukast (SINGULAIR) 10 MG tablet Take 10 mg by mouth at bedtime.      [name] NEXIUM 40 MG capsule TAKE 1 CAPSULE TWICE A DAY  180 capsule  2  .  omalizumab (XOLAIR) 150 MG injection Inject 150 mg into the skin every 14 (fourteen) days.       . polyethylene glycol (MIRALAX / GLYCOLAX) packet Take 17 g by mouth as needed.      . potassium chloride (K-DUR,KLOR-CON) 10 MEQ tablet Take 20 mEq by mouth daily. Two tablets once dail      . pramipexole (MIRAPEX) 0.125 MG tablet Take 0.375 mg by mouth at bedtime.       . rosuvastatin (CRESTOR) 5 MG tablet Take 5 mg by mouth at bedtime.      [name] Spacer/Aero-Holding Chambers (AEROCHAMBER MV) inhaler by Other route. Use as instructed       . telmisartan (MICARDIS) 40 MG tablet Take 20 mg by mouth at bedtime.      . traMADol (ULTRAM) 50 MG tablet Take 1 tablet by mouth at bedtime.      . trolamine salicylate (ASPERCREME) 10 % cream Apply 1 application topically 2 (two) times daily as needed (pain).       . vitamin B-12 (CYANOCOBALAMIN) 500 MCG tablet Take 500 mcg by mouth daily.         No current facility-administered medications on file prior to visit.    Allergies    Allergen Reactions  . Cephalosporins     REACTION: rash  . Codeine     REACTION: rash  . Eggs Or Egg-Derived Products   . Influenza Vaccines   . Levofloxacin     REACTION: rash  . Penicillins     REACTION: rash  . Pneumococcal Vaccines Rash    Past Medical History  Diagnosis Date  . Restless legs syndrome (RLS)   . Osteoporosis, unspecified     Knee and hip osteoarthritis bilaterally  . Unspecified essential hypertension   . Other diseases of vocal cords   . Unspecified asthma(493.90)   . Esophageal reflux   . Allergic rhinitis, cause unspecified   . Obstructive chronic bronchitis without exacerbation   . Hyperlipidemia   . Gallbladder disease   . Degenerative disk disease   . Perennial allergic rhinitis   . Diverticulosis   . Lymphedema     in the right arm  . Osteopenia   . Shingles 2007  . Chronic kidney disease, stage II (mild)   . Dysrhythmia     "skips a beat sometimes"  . Pneumonia     x2  . Cancer     right breast  . Blood clot in vein     behind right knee    Past Surgical History  Procedure Laterality Date  . Cardiac catheterization  08/012008    normal -- EF of 65%  . Mastectomy  1989    right  . Mastectomy  1990    left  . Back surgery  1983    lumbar procedure  . Back surgery  1981    lumbar procedure  . Total abdominal hysterectomy w/ bilateral salpingoophorectomy    . Cholecystectomy  1983  . Cataract extraction Bilateral   . Total knee arthroplasty Right 10/05/2012    Procedure: RIGHT TOTAL KNEE ARTHROPLASTY;  Surgeon: [name] Pal, MD;  Location: [facility];  Service: Orthopedics;  Laterality: Right;    History  Smoking status  . Never Smoker   Smokeless tobacco  . Never Used    History  Alcohol Use No    Family History  Problem Relation Age of Onset  . Heart attack Mother   . Heart attack Father   . Cancer  Brother   . Asthma Brother   . Coronary artery disease Sister   . Asthma Mother   . Heart failure Father      Reviw of Systems:  Reviewed in the HPI.  All other systems are negative.  Physical Exam: Blood pressure 120/66, pulse 73, height 5\' 5"  (1.651 m), weight 158 lb (71.668 kg). General: Well developed, well nourished, in no acute distress.  Head: Normocephalic, atraumatic, sclera non-icteric, mucus membranes are moist,   Neck: Supple. Carotids are 2 + without bruits. No JVD  Lungs: Clear bilaterally to auscultation.  Heart: regular rate.  normal  S1 S2. No murmurs, gallops or rubs.  Abdomen: Soft, non-tender, non-distended with normal bowel sounds. No hepatomegaly. No rebound/guarding. No masses.  Msk:  Strength and tone are normal  Extremities: No clubbing or cyanosis. No edema.  Distal pedal pulses are 2+ and equal bilaterally.  Neuro: Alert and oriented X 3. Moves all extremities spontaneously.  Psych:  Responds to questions appropriately with a normal affect.  ECG: 03/03/2013: NSR at 73 NS ST/T abn.  No significant changes from previous tracings.  Assessment / Plan:  :

## 2013-03-03 NOTE — Assessment & Plan Note (Signed)
Her blood pressure is well-controlled. Continue same medications. 

## 2013-03-09 ENCOUNTER — Encounter: Payer: Self-pay | Admitting: Critical Care Medicine

## 2013-03-09 ENCOUNTER — Ambulatory Visit (INDEPENDENT_AMBULATORY_CARE_PROVIDER_SITE_OTHER): Payer: Medicare Other | Admitting: Critical Care Medicine

## 2013-03-09 ENCOUNTER — Ambulatory Visit (INDEPENDENT_AMBULATORY_CARE_PROVIDER_SITE_OTHER): Payer: Medicare Other

## 2013-03-09 VITALS — BP 116/72 | HR 81 | Temp 97.8°F | Ht 65.0 in | Wt 160.2 lb

## 2013-03-09 DIAGNOSIS — J45909 Unspecified asthma, uncomplicated: Secondary | ICD-10-CM

## 2013-03-09 DIAGNOSIS — J4550 Severe persistent asthma, uncomplicated: Secondary | ICD-10-CM

## 2013-03-09 MED ORDER — MONTELUKAST SODIUM 10 MG PO TABS: 10.0000 mg | ORAL_TABLET | Freq: Every day | ORAL | Status: DC

## 2013-03-09 MED ORDER — OMALIZUMAB 150 MG ~~LOC~~ SOLR
300.0000 mg | Freq: Once | SUBCUTANEOUS | Status: AC
  Administered 2013-03-09: 300 mg via SUBCUTANEOUS

## 2013-03-09 NOTE — Progress Notes (Signed)
Subjective:    Patient ID: [name], female    DOB: [birth year], 78 y.o.   MRN: [id number]  HPI  79 yo with moderate persistent asthma with severe atopy   02/17/13 Acute OV  Complains dry coughing, clear runny nose since 10/7.  Pt is requesting depo shot.  No fever, discolored mucus, chest pain , orthopnea, edema , recent travel or abx use.  Has drippy nose and nasal drainage in throat.  Has been using nasonex and astelin without much help.   03/09/2013 Chief Complaint  Patient presents with  . 3 wk follow up    Breathing has improved.  Does still have some SOB, a little chest tightness, and coughing some (clear mucus).    Pt saw NP 02/14/13 Continue nasonex and astelin to two puff ea nostril twice daily  Continue on chlorpheniramine 8mg  at bedtime  Add Chlorpheniramine 4mg  daily in am.  Use mucinex DM twice daily for cough  A depomedrol 120mg  injection was given  Continue with saline nasal rinses.  Pt was on ABX>>cleocin for derm issues.   Now is better.  Pt still with some dyspnea.  Pt had severe cough and clear out of the nose.    Review of Systems  Constitutional:   No  weight loss, night sweats,  Fevers, chills, fatigue, or  lassitude.  HEENT:   No headaches,  Difficulty swallowing,  Tooth/dental problems, or  Sore throat,                No sneezing, itching, ear ache,  +nasal congestion, post nasal drip,   CV:  No chest pain,  Orthopnea, PND, swelling in lower extremities, anasarca, dizziness, palpitations, syncope.   GI  No heartburn, indigestion, abdominal pain, nausea, vomiting, diarrhea, change in bowel habits, loss of appetite, bloody stools.   Resp:  No chest wall deformity  Skin: no rash or lesions.  GU: no dysuria, change in color of urine, no urgency or frequency.  No flank pain, no hematuria   MS:  No joint pain or swelling.  No decreased range of motion.  No back pain.  Psych:  No change in mood or affect. No depression or anxiety.  No memory  loss.    '     Objective:   Physical Exam BP 116/72  Pulse 81  Temp(Src) 97.8 F (36.6 C) (Oral)  Ht 5\' 5"  (1.651 m)  Wt 160 lb 3.2 oz (72.666 kg)  BMI 26.66 kg/m2  SpO2 98%  GEN: A/Ox3; pleasant , NAD, well nourished   HEENT:  [place]/AT,  EACs-clear, TMs-wnl, NOSE-clear drainage  THROAT-clear, no lesions, no postnasal drip or exudate noted.   NECK:  Supple w/ fair ROM; no JVD; normal carotid impulses w/o bruits; no thyromegaly or nodules palpated; no lymphadenopathy.  RESP  Clear  P & A; w/o, wheezes/ rales/ or rhonchi.no accessory muscle use, no dullness to percussion  CARD:  RRR, no m/r/g  , no peripheral edema, pulses intact, no cyanosis or clubbing.  GI:   Soft & nt; nml bowel sounds; no organomegaly or masses detected.  Musco: Warm bil, no deformities or joint swelling noted.   Neuro: alert, no focal deficits noted.    Skin: Warm, no lesions or rashes         Assessment & Plan:   Severe persistent asthma Severe persistent asthma Stable Monitor Cont inhaled meds    Updated Medication List Outpatient Encounter Prescriptions as of 03/09/2013  Medication Sig  . albuterol (PROVENTIL HFA;VENTOLIN HFA)  108 (90 BASE) MCG/ACT inhaler Inhale 2 puffs into the lungs every 6 (six) hours as needed.  . ALPRAZolam (XANAX) 0.5 MG tablet Take 0.5 mg by mouth 2 (two) times daily as needed for anxiety.   [name] aspirin 81 MG chewable tablet Chew 81 mg by mouth daily.  [name] azelastine (ASTELIN) 137 MCG/SPRAY nasal spray Place 2 sprays into the nose 2 (two) times daily. Use in each nostril as directed  . benzonatate (TESSALON) 100 MG capsule Take 100 mg by mouth 3 (three) times daily as needed for cough.  . budesonide-formoterol (SYMBICORT) 160-4.5 MCG/ACT inhaler Inhale 2 puffs into the lungs 2 (two) times daily.  . Calcium Carbonate-Vitamin D (CALTRATE 600+D) 600-400 MG-UNIT per tablet Take 1 tablet by mouth daily.   . chlorpheniramine (CHLOR-TRIMETON) 4 MG tablet Take by mouth. 4  mg in the morning and 8 mg at bedtime  . Cholecalciferol (VITAMIN D3) 1000 UNITS tablet Take 1,000 Units by mouth daily.    [name] dextromethorphan (DELSYM) 30 MG/5ML liquid Take 60 mg by mouth 2 (two) times daily as needed for cough.  . fexofenadine (ALLEGRA) 180 MG tablet Take 180 mg by mouth daily.    . furosemide (LASIX) 40 MG tablet Take 40 mg by mouth every morning.   . ibandronate (BONIVA) 150 MG tablet Take 150 mg by mouth every 30 (thirty) days. Take in the morning with a full glass of water, on an empty stomach, and do not take anything else by mouth or lie down for the next 30 min.  . mometasone (NASONEX) 50 MCG/ACT nasal spray Place 2 sprays into the nose 2 (two) times daily.  . montelukast (SINGULAIR) 10 MG tablet Take 1 tablet (10 mg total) by mouth at bedtime.  [name] NEXIUM 40 MG capsule TAKE 1 CAPSULE TWICE A DAY  . omalizumab (XOLAIR) 150 MG injection Inject 150 mg into the skin every 14 (fourteen) days.   . polyethylene glycol (MIRALAX / GLYCOLAX) packet Take 17 g by mouth as needed.  . potassium chloride (K-DUR,KLOR-CON) 10 MEQ tablet Take 20 mEq by mouth daily. Two tablets once dail  . pramipexole (MIRAPEX) 0.125 MG tablet Take 0.375 mg by mouth at bedtime.   . rosuvastatin (CRESTOR) 5 MG tablet Take 1 tablet (5 mg total) by mouth at bedtime.  [name] Spacer/Aero-Holding Chambers (AEROCHAMBER MV) inhaler by Other route. Use as instructed   . telmisartan (MICARDIS) 40 MG tablet Take 20 mg by mouth at bedtime.  . traMADol (ULTRAM) 50 MG tablet Take 1 tablet by mouth at bedtime.  . trolamine salicylate (ASPERCREME) 10 % cream Apply 1 application topically 2 (two) times daily as needed (pain).   . vitamin B-12 (CYANOCOBALAMIN) 500 MCG tablet Take 500 mcg by mouth daily.    . [DISCONTINUED] chlorpheniramine (CHLOR-TRIMETON) 4 MG tablet Take 2 tablets (8 mg total) by mouth at bedtime.  . [DISCONTINUED] montelukast (SINGULAIR) 10 MG tablet Take 10 mg by mouth at bedtime.  . [DISCONTINUED] aspirin  EC 325 MG tablet Take 81 mg by mouth daily.   . [DISCONTINUED] dextrose 5 % SOLN 100 mL with clindamycin 900 MG/6ML SOLN 900 mg Inject 900 mg into the vein 3 (three) times daily.

## 2013-03-09 NOTE — Patient Instructions (Signed)
No change in medications. Return in        2 months 

## 2013-03-10 ENCOUNTER — Telehealth: Payer: Self-pay | Admitting: *Deleted

## 2013-03-10 DIAGNOSIS — E876 Hypokalemia: Secondary | ICD-10-CM

## 2013-03-10 NOTE — Telephone Encounter (Signed)
Message copied by Antony Odea on Thu Mar 10, 2013  2:36 PM ------      Message from: Vesta Mixer      Created: Fri Mar 04, 2013  6:13 PM       She is on lasix and kdur.  posassium is a bit low.  She should increase the posassium in her diet and recheck in 1 month. Continue kdur       ------

## 2013-03-10 NOTE — Telephone Encounter (Signed)
Pt has not been taking k+as prescribed per pt. Reinforced why k+ needed, pt verbalized understanding and lab date set. Pt agreed to plan of care.

## 2013-03-11 NOTE — Assessment & Plan Note (Signed)
Severe persistent asthma Stable Monitor Cont inhaled meds

## 2013-03-14 DIAGNOSIS — L819 Disorder of pigmentation, unspecified: Secondary | ICD-10-CM | POA: Diagnosis not present

## 2013-03-23 ENCOUNTER — Ambulatory Visit (INDEPENDENT_AMBULATORY_CARE_PROVIDER_SITE_OTHER): Payer: Medicare Other

## 2013-03-23 DIAGNOSIS — J4550 Severe persistent asthma, uncomplicated: Secondary | ICD-10-CM

## 2013-03-23 DIAGNOSIS — J45909 Unspecified asthma, uncomplicated: Secondary | ICD-10-CM | POA: Diagnosis not present

## 2013-03-23 MED ORDER — OMALIZUMAB 150 MG ~~LOC~~ SOLR
300.0000 mg | Freq: Once | SUBCUTANEOUS | Status: AC
  Administered 2013-03-23: 300 mg via SUBCUTANEOUS

## 2013-04-05 ENCOUNTER — Other Ambulatory Visit (INDEPENDENT_AMBULATORY_CARE_PROVIDER_SITE_OTHER): Payer: Medicare Other

## 2013-04-05 DIAGNOSIS — E876 Hypokalemia: Secondary | ICD-10-CM | POA: Diagnosis not present

## 2013-04-05 LAB — BASIC METABOLIC PANEL
CO2: 30 mEq/L (ref 19–32)
Calcium: 9.3 mg/dL (ref 8.4–10.5)
GFR: 32.88 mL/min — ABNORMAL LOW (ref >60.00)
Glucose, Bld: 116 mg/dL — ABNORMAL HIGH (ref 70–99)
Potassium: 3.7 mEq/L (ref 3.5–5.1)
Sodium: 139 mEq/L (ref 135–145)

## 2013-04-06 ENCOUNTER — Telehealth: Payer: Self-pay | Admitting: *Deleted

## 2013-04-06 ENCOUNTER — Ambulatory Visit (INDEPENDENT_AMBULATORY_CARE_PROVIDER_SITE_OTHER): Payer: Medicare Other

## 2013-04-06 DIAGNOSIS — J45909 Unspecified asthma, uncomplicated: Secondary | ICD-10-CM

## 2013-04-06 DIAGNOSIS — I10 Essential (primary) hypertension: Secondary | ICD-10-CM

## 2013-04-06 MED ORDER — POTASSIUM CHLORIDE CRYS ER 10 MEQ PO TBCR: 10.0000 meq | EXTENDED_RELEASE_TABLET | Freq: Every day | ORAL | Status: DC

## 2013-04-06 MED ORDER — FUROSEMIDE 40 MG PO TABS: 20.0000 mg | ORAL_TABLET | Freq: Every morning | ORAL | Status: DC

## 2013-04-06 MED ORDER — OMALIZUMAB 150 MG ~~LOC~~ SOLR
300.0000 mg | Freq: Once | SUBCUTANEOUS | Status: AC
  Administered 2013-04-06: 300 mg via SUBCUTANEOUS

## 2013-04-06 NOTE — Telephone Encounter (Signed)
CURRENT DOSE  LASIX 40 MG DAILY POTASSIUM 10 MEQ 2 TABLETS DAILY  Pt was told to cut lasix to 20 mg daily Told to take potassium 10 mq daily Lab date given for 1 month  Pt verbalized understanding. Pt told to avoid  high sodium foods and to call with concerns with bp or edema. Pt verbalized understanding

## 2013-04-06 NOTE — Telephone Encounter (Signed)
Message copied by Antony Odea on Wed Apr 06, 2013  2:23 PM ------      Message from: [place], [place] J      Created: Tue Apr 05, 2013  7:20 PM       Creatinine is a bit high.  She may need to decrease her lasix to BID  .  Recheck in 1 month       ------

## 2013-04-14 ENCOUNTER — Telehealth: Payer: Self-pay | Admitting: Cardiovascular Disease

## 2013-04-14 NOTE — Telephone Encounter (Signed)
Pt was called/ she wanted to know if her k+ was refilled/ yes it was refilled to mail order pharmacy.

## 2013-04-14 NOTE — Telephone Encounter (Signed)
New message     Questions about potassium medicine.  Pt thinks the dr is changing her medication

## 2013-04-18 ENCOUNTER — Other Ambulatory Visit: Payer: Self-pay | Admitting: Dermatology

## 2013-04-18 DIAGNOSIS — L98499 Non-pressure chronic ulcer of skin of other sites with unspecified severity: Secondary | ICD-10-CM | POA: Diagnosis not present

## 2013-04-18 DIAGNOSIS — M793 Panniculitis, unspecified: Secondary | ICD-10-CM | POA: Diagnosis not present

## 2013-04-20 ENCOUNTER — Ambulatory Visit (INDEPENDENT_AMBULATORY_CARE_PROVIDER_SITE_OTHER): Payer: Medicare Other

## 2013-04-20 DIAGNOSIS — J45909 Unspecified asthma, uncomplicated: Secondary | ICD-10-CM

## 2013-04-20 DIAGNOSIS — J4550 Severe persistent asthma, uncomplicated: Secondary | ICD-10-CM

## 2013-04-22 MED ORDER — OMALIZUMAB 150 MG ~~LOC~~ SOLR
300.0000 mg | Freq: Once | SUBCUTANEOUS | Status: AC
  Administered 2013-04-22: 300 mg via SUBCUTANEOUS

## 2013-04-25 DIAGNOSIS — M793 Panniculitis, unspecified: Secondary | ICD-10-CM | POA: Diagnosis not present

## 2013-05-02 ENCOUNTER — Ambulatory Visit (INDEPENDENT_AMBULATORY_CARE_PROVIDER_SITE_OTHER): Payer: Medicare Other | Admitting: Critical Care Medicine

## 2013-05-02 ENCOUNTER — Encounter: Payer: Self-pay | Admitting: Critical Care Medicine

## 2013-05-02 VITALS — BP 150/82 | HR 87 | Temp 98.1°F | Ht 65.0 in | Wt 161.2 lb

## 2013-05-02 DIAGNOSIS — J45909 Unspecified asthma, uncomplicated: Secondary | ICD-10-CM | POA: Diagnosis not present

## 2013-05-02 DIAGNOSIS — J4550 Severe persistent asthma, uncomplicated: Secondary | ICD-10-CM

## 2013-05-02 NOTE — Assessment & Plan Note (Signed)
Severe persistent asthma stable at this time Plan Maintain inhaled medications as prescribed 

## 2013-05-02 NOTE — Patient Instructions (Signed)
No change in medications. Return in        2 months 

## 2013-05-02 NOTE — Progress Notes (Signed)
Subjective:    Patient ID: [name], female    DOB: [birth year], 79 y.o.   MRN: [id number]  HPI  79 yo with moderate persistent asthma with severe atopy   05/02/2013 Chief Complaint  Patient presents with  . 2 month follow up    Breathing doing well overall.  Does still have occas cough - increases qhs and occas wheezing.   No chest tightness or pain.    Min cough at night.  Now not severe.  Dyspnea is at baseline. Pt denies any significant sore throat, nasal congestion or excess secretions, fever, chills, sweats, unintended weight loss, pleurtic or exertional chest pain, orthopnea PND, or leg swelling Pt denies any increase in rescue therapy over baseline, denies waking up needing it or having any early am or nocturnal exacerbations of coughing/wheezing/or dyspnea. Pt also denies any obvious fluctuation in symptoms with  weather or environmental change or other alleviating or aggravating factors    Review of Systems  Constitutional:   No  weight loss, night sweats,  Fevers, chills, fatigue, or  lassitude.  HEENT:   No headaches,  Difficulty swallowing,  Tooth/dental problems, or  Sore throat,                No sneezing, itching, ear ache,  +nasal congestion, post nasal drip,   CV:  No chest pain,  Orthopnea, PND, swelling in lower extremities, anasarca, dizziness, palpitations, syncope.   GI  No heartburn, indigestion, abdominal pain, nausea, vomiting, diarrhea, change in bowel habits, loss of appetite, bloody stools.   Resp:  No chest wall deformity  Skin: no rash or lesions.  GU: no dysuria, change in color of urine, no urgency or frequency.  No flank pain, no hematuria   MS:  No joint pain or swelling.  No decreased range of motion.  No back pain.  Psych:  No change in mood or affect. No depression or anxiety.  No memory loss.     Objective:   Physical Exam BP 150/82  Pulse 87  Temp(Src) 98.1 F (36.7 C) (Oral)  Ht 5\' 5"  (1.651 m)  Wt 161 lb 3.2 oz (73.12  kg)  BMI 26.83 kg/m2  SpO2 100%  GEN: A/Ox3; pleasant , NAD, well nourished   HEENT:  [place]/AT,  EACs-clear, TMs-wnl, NOSE-clear drainage  THROAT-clear, no lesions, no postnasal drip or exudate noted.   NECK:  Supple w/ fair ROM; no JVD; normal carotid impulses w/o bruits; no thyromegaly or nodules palpated; no lymphadenopathy.  RESP  Clear  P & A; w/o, wheezes/ rales/ or rhonchi.no accessory muscle use, no dullness to percussion  CARD:  RRR, no m/r/g  , no peripheral edema, pulses intact, no cyanosis or clubbing.  GI:   Soft & nt; nml bowel sounds; no organomegaly or masses detected.  Musco: Warm bil, no deformities or joint swelling noted.   Neuro: alert, no focal deficits noted.    Skin: Warm, no lesions or rashes     Assessment & Plan:   Severe persistent asthma Severe persistent asthma stable at this time Plan Maintain inhaled medications as prescribed    Updated Medication List Outpatient Encounter Prescriptions as of 05/02/2013  Medication Sig  . albuterol (PROVENTIL HFA;VENTOLIN HFA) 108 (90 BASE) MCG/ACT inhaler Inhale 2 puffs into the lungs every 6 (six) hours as needed.  . ALPRAZolam (XANAX) 0.5 MG tablet Take 0.5 mg by mouth 2 (two) times daily as needed for anxiety.   [name] aspirin 81 MG chewable tablet Chew 81 mg  by mouth every other day.   [name] azelastine (ASTELIN) 137 MCG/SPRAY nasal spray Place 2 sprays into the nose 2 (two) times daily. Use in each nostril as directed  . benzonatate (TESSALON) 100 MG capsule Take 100 mg by mouth 3 (three) times daily as needed for cough.  . budesonide-formoterol (SYMBICORT) 160-4.5 MCG/ACT inhaler Inhale 2 puffs into the lungs 2 (two) times daily.  . Calcium Carbonate-Vitamin D (CALTRATE 600+D) 600-400 MG-UNIT per tablet Take 1 tablet by mouth daily.   . chlorpheniramine (CHLOR-TRIMETON) 4 MG tablet Take by mouth. 4 mg in the morning and 8 mg at bedtime  . Cholecalciferol (VITAMIN D3) 1000 UNITS tablet Take 1,000 Units by mouth  daily.    [name] dextromethorphan (DELSYM) 30 MG/5ML liquid Take 60 mg by mouth 2 (two) times daily as needed for cough.  . diclofenac sodium (VOLTAREN) 1 % GEL Apply topically as needed.  . fexofenadine (ALLEGRA) 180 MG tablet Take 180 mg by mouth daily.    . furosemide (LASIX) 40 MG tablet Take 0.5 tablets (20 mg total) by mouth every morning.  . ibandronate (BONIVA) 150 MG tablet Take 150 mg by mouth every 30 (thirty) days. Take in the morning with a full glass of water, on an empty stomach, and do not take anything else by mouth or lie down for the next 30 min.  . mometasone (NASONEX) 50 MCG/ACT nasal spray Place 2 sprays into the nose 2 (two) times daily.  . montelukast (SINGULAIR) 10 MG tablet Take 1 tablet (10 mg total) by mouth at bedtime.  . mupirocin ointment (BACTROBAN) 2 % daily.  [name] NEXIUM 40 MG capsule TAKE 1 CAPSULE TWICE A DAY  . omalizumab (XOLAIR) 150 MG injection Inject 150 mg into the skin every 14 (fourteen) days.   . polyethylene glycol (MIRALAX / GLYCOLAX) packet Take 17 g by mouth as needed.  . potassium chloride (K-DUR,KLOR-CON) 10 MEQ tablet Take 1 tablet (10 mEq total) by mouth daily.  . pramipexole (MIRAPEX) 0.125 MG tablet Take 0.375 mg by mouth at bedtime.   . rosuvastatin (CRESTOR) 5 MG tablet Take 1 tablet (5 mg total) by mouth at bedtime.  [name] Spacer/Aero-Holding Chambers (AEROCHAMBER MV) inhaler by Other route. Use as instructed   . telmisartan (MICARDIS) 40 MG tablet Take 20 mg by mouth at bedtime.  . traMADol (ULTRAM) 50 MG tablet Take 1 tablet by mouth at bedtime.  . trolamine salicylate (ASPERCREME) 10 % cream Apply 1 application topically 2 (two) times daily as needed (pain).   . vitamin B-12 (CYANOCOBALAMIN) 500 MCG tablet Take 500 mcg by mouth daily.

## 2013-05-04 ENCOUNTER — Ambulatory Visit (INDEPENDENT_AMBULATORY_CARE_PROVIDER_SITE_OTHER): Payer: Medicare Other

## 2013-05-04 DIAGNOSIS — J45909 Unspecified asthma, uncomplicated: Secondary | ICD-10-CM

## 2013-05-09 ENCOUNTER — Other Ambulatory Visit (INDEPENDENT_AMBULATORY_CARE_PROVIDER_SITE_OTHER): Payer: Medicare Other

## 2013-05-09 DIAGNOSIS — I10 Essential (primary) hypertension: Secondary | ICD-10-CM

## 2013-05-09 LAB — BASIC METABOLIC PANEL
BUN: 20 mg/dL (ref 6–23)
Calcium: 9.4 mg/dL (ref 8.4–10.5)
Chloride: 103 mEq/L (ref 96–112)
Creatinine, Ser: 1.4 mg/dL — ABNORMAL HIGH (ref 0.4–1.2)
GFR: 38.62 mL/min — ABNORMAL LOW (ref >60.00)
Glucose, Bld: 119 mg/dL — ABNORMAL HIGH (ref 70–99)

## 2013-05-09 MED ORDER — OMALIZUMAB 150 MG ~~LOC~~ SOLR
300.0000 mg | Freq: Once | SUBCUTANEOUS | Status: AC
  Administered 2013-05-09: 300 mg via SUBCUTANEOUS

## 2013-05-17 ENCOUNTER — Telehealth: Payer: Self-pay | Admitting: Cardiovascular Disease

## 2013-05-17 NOTE — Telephone Encounter (Signed)
Pt Called with bmet.

## 2013-05-17 NOTE — Telephone Encounter (Signed)
New message     Pt need results of lab work done dec 2014. Call in the afternoon

## 2013-05-18 ENCOUNTER — Ambulatory Visit (INDEPENDENT_AMBULATORY_CARE_PROVIDER_SITE_OTHER): Payer: Medicare Other

## 2013-05-18 DIAGNOSIS — H02839 Dermatochalasis of unspecified eye, unspecified eyelid: Secondary | ICD-10-CM | POA: Diagnosis not present

## 2013-05-18 DIAGNOSIS — H04129 Dry eye syndrome of unspecified lacrimal gland: Secondary | ICD-10-CM | POA: Diagnosis not present

## 2013-05-18 DIAGNOSIS — H43819 Vitreous degeneration, unspecified eye: Secondary | ICD-10-CM | POA: Diagnosis not present

## 2013-05-18 DIAGNOSIS — H18519 Endothelial corneal dystrophy, unspecified eye: Secondary | ICD-10-CM | POA: Diagnosis not present

## 2013-05-18 DIAGNOSIS — J45909 Unspecified asthma, uncomplicated: Secondary | ICD-10-CM

## 2013-05-18 DIAGNOSIS — Z961 Presence of intraocular lens: Secondary | ICD-10-CM | POA: Diagnosis not present

## 2013-05-18 DIAGNOSIS — H26499 Other secondary cataract, unspecified eye: Secondary | ICD-10-CM | POA: Diagnosis not present

## 2013-05-18 DIAGNOSIS — H40019 Open angle with borderline findings, low risk, unspecified eye: Secondary | ICD-10-CM | POA: Diagnosis not present

## 2013-05-18 MED ORDER — OMALIZUMAB 150 MG ~~LOC~~ SOLR
300.0000 mg | Freq: Once | SUBCUTANEOUS | Status: AC
  Administered 2013-05-18: 300 mg via SUBCUTANEOUS

## 2013-05-19 ENCOUNTER — Encounter: Payer: Self-pay | Admitting: Internal Medicine

## 2013-05-23 DIAGNOSIS — H26499 Other secondary cataract, unspecified eye: Secondary | ICD-10-CM | POA: Diagnosis not present

## 2013-05-24 ENCOUNTER — Other Ambulatory Visit: Payer: Self-pay

## 2013-05-24 ENCOUNTER — Other Ambulatory Visit: Payer: Self-pay | Admitting: Critical Care Medicine

## 2013-05-24 MED ORDER — TELMISARTAN 40 MG PO TABS: 20.0000 mg | ORAL_TABLET | Freq: Every day | ORAL | Status: DC

## 2013-06-01 ENCOUNTER — Ambulatory Visit (INDEPENDENT_AMBULATORY_CARE_PROVIDER_SITE_OTHER): Payer: Medicare Other

## 2013-06-01 DIAGNOSIS — J45909 Unspecified asthma, uncomplicated: Secondary | ICD-10-CM

## 2013-06-02 MED ORDER — OMALIZUMAB 150 MG ~~LOC~~ SOLR
300.0000 mg | Freq: Once | SUBCUTANEOUS | Status: AC
  Administered 2013-06-02: 300 mg via SUBCUTANEOUS

## 2013-06-03 DIAGNOSIS — M25569 Pain in unspecified knee: Secondary | ICD-10-CM | POA: Diagnosis not present

## 2013-06-03 DIAGNOSIS — Z96659 Presence of unspecified artificial knee joint: Secondary | ICD-10-CM | POA: Diagnosis not present

## 2013-06-06 ENCOUNTER — Other Ambulatory Visit: Payer: Self-pay | Admitting: Critical Care Medicine

## 2013-06-15 ENCOUNTER — Ambulatory Visit (INDEPENDENT_AMBULATORY_CARE_PROVIDER_SITE_OTHER): Payer: Medicare Other

## 2013-06-15 DIAGNOSIS — J45909 Unspecified asthma, uncomplicated: Secondary | ICD-10-CM | POA: Diagnosis not present

## 2013-06-17 MED ORDER — OMALIZUMAB 150 MG ~~LOC~~ SOLR
300.0000 mg | Freq: Once | SUBCUTANEOUS | Status: AC
  Administered 2013-06-17: 300 mg via SUBCUTANEOUS

## 2013-06-20 DIAGNOSIS — M793 Panniculitis, unspecified: Secondary | ICD-10-CM | POA: Diagnosis not present

## 2013-06-29 ENCOUNTER — Ambulatory Visit: Payer: Medicare Other

## 2013-07-01 ENCOUNTER — Telehealth: Payer: Self-pay | Admitting: Critical Care Medicine

## 2013-07-01 ENCOUNTER — Encounter: Payer: Self-pay | Admitting: Critical Care Medicine

## 2013-07-01 ENCOUNTER — Ambulatory Visit (INDEPENDENT_AMBULATORY_CARE_PROVIDER_SITE_OTHER): Payer: Medicare Other

## 2013-07-01 ENCOUNTER — Ambulatory Visit (INDEPENDENT_AMBULATORY_CARE_PROVIDER_SITE_OTHER): Payer: Medicare Other | Admitting: Critical Care Medicine

## 2013-07-01 VITALS — BP 140/74 | HR 102 | Temp 97.1°F | Ht 64.0 in | Wt 161.6 lb

## 2013-07-01 DIAGNOSIS — J4550 Severe persistent asthma, uncomplicated: Secondary | ICD-10-CM

## 2013-07-01 DIAGNOSIS — J45909 Unspecified asthma, uncomplicated: Secondary | ICD-10-CM | POA: Diagnosis not present

## 2013-07-01 MED ORDER — OMALIZUMAB 150 MG ~~LOC~~ SOLR
300.0000 mg | Freq: Once | SUBCUTANEOUS | Status: AC
  Administered 2013-07-01: 300 mg via SUBCUTANEOUS

## 2013-07-01 MED ORDER — EPINEPHRINE 0.3 MG/0.3ML IJ SOAJ: 0.3000 mg | Freq: Once | INTRAMUSCULAR | Status: DC

## 2013-07-01 MED ORDER — ALBUTEROL SULFATE (2.5 MG/3ML) 0.083% IN NEBU: 2.5000 mg | INHALATION_SOLUTION | Freq: Four times a day (QID) | RESPIRATORY_TRACT | Status: DC | PRN

## 2013-07-01 NOTE — Addendum Note (Signed)
Addended by: Elsie Stain on: 07/01/2013 09:33 AM   Modules accepted: Orders

## 2013-07-01 NOTE — Patient Instructions (Signed)
No change in medications Albuterol sent to the pharmacy for as needed use in nebulizer Return 3 months

## 2013-07-01 NOTE — Progress Notes (Signed)
Subjective:    Patient ID: [name], female    DOB: [birth year], 80 y.o.   MRN: [id number]  HPI  80 yo with moderate persistent asthma with severe atopy   07/01/2013 Chief Complaint  Patient presents with  . 2 month follow up    Complains of SOB, wheezing and persistant dry cough. Denies CP and tightness.     Pt still coughing and dyspneic.  No chest pain noted. Pt notes more wheezing.   Pt denies any significant sore throat, nasal congestion or excess secretions, fever, chills, sweats, unintended weight loss, pleurtic or exertional chest pain, orthopnea PND, or leg swelling Pt denies any increase in rescue therapy over baseline, denies waking up needing it or having any early am or nocturnal exacerbations of coughing/wheezing/or dyspnea. Pt also denies any obvious fluctuation in symptoms with  weather or environmental change or other alleviating or aggravating factors    Review of Systems  Constitutional:   No  weight loss, night sweats,  Fevers, chills, fatigue, or  lassitude.  HEENT:   No headaches,  Difficulty swallowing,  Tooth/dental problems, or  Sore throat,                No sneezing, itching, ear ache,  +nasal congestion, post nasal drip,   CV:  No chest pain,  Orthopnea, PND, swelling in lower extremities, anasarca, dizziness, palpitations, syncope.   GI  No heartburn, indigestion, abdominal pain, nausea, vomiting, diarrhea, change in bowel habits, loss of appetite, bloody stools.   Resp:  No chest wall deformity  Skin: no rash or lesions.  GU: no dysuria, change in color of urine, no urgency or frequency.  No flank pain, no hematuria   MS:  No joint pain or swelling.  No decreased range of motion.  No back pain.  Psych:  No change in mood or affect. No depression or anxiety.  No memory loss.     Objective:   Physical Exam BP 140/74  Pulse 102  Temp(Src) 97.1 F (36.2 C) (Oral)  Ht 5\' 4"  (1.626 m)  Wt 161 lb 9.6 oz (73.301 kg)  BMI 27.72 kg/m2  SpO2  98%  GEN: A/Ox3; pleasant , NAD, well nourished   HEENT:  [place]/AT,  EACs-clear, TMs-wnl, NOSE-clear drainage  THROAT-clear, no lesions, no postnasal drip or exudate noted.   NECK:  Supple w/ fair ROM; no JVD; normal carotid impulses w/o bruits; no thyromegaly or nodules palpated; no lymphadenopathy.  RESP  Clear  P & A; w/o, wheezes/ rales/ or rhonchi.no accessory muscle use, no dullness to percussion  CARD:  RRR, no m/r/g  , no peripheral edema, pulses intact, no cyanosis or clubbing.  GI:   Soft & nt; nml bowel sounds; no organomegaly or masses detected.  Musco: Warm bil, no deformities or joint swelling noted.   Neuro: alert, no focal deficits noted.    Skin: Warm, no lesions or rashes     Assessment & Plan:   Severe persistent asthma Severe persistent asthma with lower airway inflammation stable at this time Plan Maintain albuterol by nebulization as needed  medications as prescribed    Updated Medication List Outpatient Encounter Prescriptions as of 07/01/2013  Medication Sig  . albuterol (PROVENTIL HFA;VENTOLIN HFA) 108 (90 BASE) MCG/ACT inhaler Inhale 2 puffs into the lungs every 6 (six) hours as needed.  . ALPRAZolam (XANAX) 0.5 MG tablet Take 0.5 mg by mouth 2 (two) times daily as needed for anxiety.   [name] azelastine (ASTELIN) 137 MCG/SPRAY nasal spray  Place 2 sprays into the nose 2 (two) times daily. Use in each nostril as directed  . benzonatate (TESSALON) 100 MG capsule Take 100 mg by mouth 3 (three) times daily as needed for cough.  . Calcium Carbonate-Vitamin D (CALTRATE 600+D) 600-400 MG-UNIT per tablet Take 1 tablet by mouth daily.   . chlorpheniramine (CHLOR-TRIMETON) 4 MG tablet Take by mouth. 4 mg in the morning and 8 mg at bedtime  . Cholecalciferol (VITAMIN D3) 1000 UNITS tablet Take 1,000 Units by mouth daily.    [name] dextromethorphan (DELSYM) 30 MG/5ML liquid Take 60 mg by mouth 2 (two) times daily as needed for cough.  . diclofenac sodium (VOLTAREN) 1 % GEL  Apply topically as needed.  . fexofenadine (ALLEGRA) 180 MG tablet Take 180 mg by mouth daily.    . furosemide (LASIX) 40 MG tablet Take 0.5 tablets (20 mg total) by mouth every morning.  . mometasone (NASONEX) 50 MCG/ACT nasal spray Place 2 sprays into the nose 2 (two) times daily.  . montelukast (SINGULAIR) 10 MG tablet Take 1 tablet (10 mg total) by mouth at bedtime.  . mupirocin ointment (BACTROBAN) 2 % daily.  [name] NEXIUM 40 MG capsule TAKE 1 CAPSULE TWICE A DAY  . omalizumab (XOLAIR) 150 MG injection Inject 150 mg into the skin every 14 (fourteen) days.   . polyethylene glycol (MIRALAX / GLYCOLAX) packet Take 17 g by mouth as needed.  . potassium chloride (K-DUR,KLOR-CON) 10 MEQ tablet Take 1 tablet (10 mEq total) by mouth daily.  . pramipexole (MIRAPEX) 0.125 MG tablet Take 0.375 mg by mouth at bedtime.   . rosuvastatin (CRESTOR) 5 MG tablet Take 1 tablet (5 mg total) by mouth at bedtime.  [name] Spacer/Aero-Holding Chambers (AEROCHAMBER MV) inhaler by Other route. Use as instructed   . SYMBICORT 160-4.5 MCG/ACT inhaler INHALE 2 PUFFS INTO THE LUNGS TWICE A DAY  . telmisartan (MICARDIS) 40 MG tablet Take 0.5 tablets (20 mg total) by mouth at bedtime.  . traMADol (ULTRAM) 50 MG tablet Take 1 tablet by mouth as needed.   . trolamine salicylate (ASPERCREME) 10 % cream Apply 1 application topically 2 (two) times daily as needed (pain).   . vitamin B-12 (CYANOCOBALAMIN) 500 MCG tablet Take 500 mcg by mouth daily.    . [DISCONTINUED] budesonide-formoterol (SYMBICORT) 160-4.5 MCG/ACT inhaler Inhale 2 puffs into the lungs 2 (two) times daily.  [name] albuterol (PROVENTIL) (2.5 MG/3ML) 0.083% nebulizer solution Take 3 mLs (2.5 mg total) by nebulization every 6 (six) hours as needed for wheezing or shortness of breath.  . [DISCONTINUED] aspirin 81 MG chewable tablet Chew 81 mg by mouth every other day.   . [DISCONTINUED] benzonatate (TESSALON) 100 MG capsule TAKE 1 CAPSULE THREE TIMES A DAY AS NEEDED  .  [DISCONTINUED] ibandronate (BONIVA) 150 MG tablet Take 150 mg by mouth every 30 (thirty) days. Take in the morning with a full glass of water, on an empty stomach, and do not take anything else by mouth or lie down for the next 30 min.

## 2013-07-01 NOTE — Assessment & Plan Note (Signed)
Severe persistent asthma with lower airway inflammation stable at this time Plan Maintain albuterol by nebulization as needed  medications as prescribed

## 2013-07-01 NOTE — Telephone Encounter (Signed)
Pt called back. rx sent.

## 2013-07-01 NOTE — Telephone Encounter (Signed)
lmomtcb x1 for pt 

## 2013-07-18 ENCOUNTER — Ambulatory Visit (INDEPENDENT_AMBULATORY_CARE_PROVIDER_SITE_OTHER): Payer: Medicare Other

## 2013-07-18 DIAGNOSIS — J45909 Unspecified asthma, uncomplicated: Secondary | ICD-10-CM

## 2013-07-19 ENCOUNTER — Other Ambulatory Visit: Payer: Self-pay | Admitting: Critical Care Medicine

## 2013-07-20 MED ORDER — OMALIZUMAB 150 MG ~~LOC~~ SOLR
300.0000 mg | Freq: Once | SUBCUTANEOUS | Status: AC
  Administered 2013-07-20: 300 mg via SUBCUTANEOUS

## 2013-07-22 ENCOUNTER — Other Ambulatory Visit: Payer: Self-pay | Admitting: *Deleted

## 2013-07-22 NOTE — Telephone Encounter (Signed)
Nasonex was sent to [facility] as 2 puffs bid for #3 g x 1. Per Fax received from [facility], the sig and quantity does not match. Form completed and faxed back to [facility] for a 3 mo supply at 2 puffs bid.  (51 g x 1)  There's 17 g in 1 inhaler.

## 2013-07-25 ENCOUNTER — Other Ambulatory Visit: Payer: Self-pay | Admitting: Critical Care Medicine

## 2013-08-01 ENCOUNTER — Ambulatory Visit (INDEPENDENT_AMBULATORY_CARE_PROVIDER_SITE_OTHER): Payer: Medicare Other

## 2013-08-01 DIAGNOSIS — J45909 Unspecified asthma, uncomplicated: Secondary | ICD-10-CM | POA: Diagnosis not present

## 2013-08-01 DIAGNOSIS — J4550 Severe persistent asthma, uncomplicated: Secondary | ICD-10-CM

## 2013-08-04 MED ORDER — OMALIZUMAB 150 MG ~~LOC~~ SOLR
300.0000 mg | Freq: Once | SUBCUTANEOUS | Status: AC
  Administered 2013-08-04: 300 mg via SUBCUTANEOUS

## 2013-08-15 ENCOUNTER — Ambulatory Visit (INDEPENDENT_AMBULATORY_CARE_PROVIDER_SITE_OTHER): Payer: Medicare Other

## 2013-08-15 DIAGNOSIS — J45909 Unspecified asthma, uncomplicated: Secondary | ICD-10-CM | POA: Diagnosis not present

## 2013-08-16 MED ORDER — OMALIZUMAB 150 MG ~~LOC~~ SOLR
300.0000 mg | Freq: Once | SUBCUTANEOUS | Status: AC
  Administered 2013-08-16: 300 mg via SUBCUTANEOUS

## 2013-08-17 DIAGNOSIS — H02839 Dermatochalasis of unspecified eye, unspecified eyelid: Secondary | ICD-10-CM | POA: Diagnosis not present

## 2013-08-17 DIAGNOSIS — E119 Type 2 diabetes mellitus without complications: Secondary | ICD-10-CM | POA: Diagnosis not present

## 2013-08-17 DIAGNOSIS — Z961 Presence of intraocular lens: Secondary | ICD-10-CM | POA: Diagnosis not present

## 2013-08-17 DIAGNOSIS — H04129 Dry eye syndrome of unspecified lacrimal gland: Secondary | ICD-10-CM | POA: Diagnosis not present

## 2013-08-17 DIAGNOSIS — H1045 Other chronic allergic conjunctivitis: Secondary | ICD-10-CM | POA: Diagnosis not present

## 2013-08-17 DIAGNOSIS — H43399 Other vitreous opacities, unspecified eye: Secondary | ICD-10-CM | POA: Diagnosis not present

## 2013-08-17 DIAGNOSIS — H18519 Endothelial corneal dystrophy, unspecified eye: Secondary | ICD-10-CM | POA: Diagnosis not present

## 2013-08-17 DIAGNOSIS — H40019 Open angle with borderline findings, low risk, unspecified eye: Secondary | ICD-10-CM | POA: Diagnosis not present

## 2013-08-19 ENCOUNTER — Encounter: Payer: Self-pay | Admitting: Cardiovascular Disease

## 2013-08-19 DIAGNOSIS — R82998 Other abnormal findings in urine: Secondary | ICD-10-CM | POA: Diagnosis not present

## 2013-08-19 DIAGNOSIS — I10 Essential (primary) hypertension: Secondary | ICD-10-CM | POA: Diagnosis not present

## 2013-08-19 DIAGNOSIS — E039 Hypothyroidism, unspecified: Secondary | ICD-10-CM | POA: Diagnosis not present

## 2013-08-19 DIAGNOSIS — E785 Hyperlipidemia, unspecified: Secondary | ICD-10-CM | POA: Diagnosis not present

## 2013-08-19 DIAGNOSIS — R7301 Impaired fasting glucose: Secondary | ICD-10-CM | POA: Diagnosis not present

## 2013-08-19 DIAGNOSIS — M899 Disorder of bone, unspecified: Secondary | ICD-10-CM | POA: Diagnosis not present

## 2013-08-19 DIAGNOSIS — R809 Proteinuria, unspecified: Secondary | ICD-10-CM | POA: Diagnosis not present

## 2013-08-19 DIAGNOSIS — M949 Disorder of cartilage, unspecified: Secondary | ICD-10-CM | POA: Diagnosis not present

## 2013-08-22 DIAGNOSIS — M793 Panniculitis, unspecified: Secondary | ICD-10-CM | POA: Diagnosis not present

## 2013-08-26 DIAGNOSIS — J45909 Unspecified asthma, uncomplicated: Secondary | ICD-10-CM | POA: Diagnosis not present

## 2013-08-26 DIAGNOSIS — D509 Iron deficiency anemia, unspecified: Secondary | ICD-10-CM | POA: Diagnosis not present

## 2013-08-26 DIAGNOSIS — F411 Generalized anxiety disorder: Secondary | ICD-10-CM | POA: Diagnosis not present

## 2013-08-26 DIAGNOSIS — E876 Hypokalemia: Secondary | ICD-10-CM | POA: Diagnosis not present

## 2013-08-26 DIAGNOSIS — R609 Edema, unspecified: Secondary | ICD-10-CM | POA: Diagnosis not present

## 2013-08-26 DIAGNOSIS — Z1331 Encounter for screening for depression: Secondary | ICD-10-CM | POA: Diagnosis not present

## 2013-08-26 DIAGNOSIS — N182 Chronic kidney disease, stage 2 (mild): Secondary | ICD-10-CM | POA: Diagnosis not present

## 2013-08-26 DIAGNOSIS — J309 Allergic rhinitis, unspecified: Secondary | ICD-10-CM | POA: Diagnosis not present

## 2013-08-26 DIAGNOSIS — Z0000 Encounter for general adult medical examination without abnormal findings: Secondary | ICD-10-CM | POA: Diagnosis not present

## 2013-08-29 ENCOUNTER — Ambulatory Visit (INDEPENDENT_AMBULATORY_CARE_PROVIDER_SITE_OTHER): Payer: Medicare Other

## 2013-08-29 DIAGNOSIS — J45909 Unspecified asthma, uncomplicated: Secondary | ICD-10-CM

## 2013-08-29 DIAGNOSIS — Z1212 Encounter for screening for malignant neoplasm of rectum: Secondary | ICD-10-CM | POA: Diagnosis not present

## 2013-08-29 MED ORDER — OMALIZUMAB 150 MG ~~LOC~~ SOLR
300.0000 mg | Freq: Once | SUBCUTANEOUS | Status: AC
  Administered 2013-08-29: 300 mg via SUBCUTANEOUS

## 2013-09-06 ENCOUNTER — Encounter (HOSPITAL_BASED_OUTPATIENT_CLINIC_OR_DEPARTMENT_OTHER): Payer: Medicare Other | Attending: General Surgery

## 2013-09-06 DIAGNOSIS — S91009A Unspecified open wound, unspecified ankle, initial encounter: Principal | ICD-10-CM

## 2013-09-06 DIAGNOSIS — S81809A Unspecified open wound, unspecified lower leg, initial encounter: Principal | ICD-10-CM

## 2013-09-06 DIAGNOSIS — L089 Local infection of the skin and subcutaneous tissue, unspecified: Secondary | ICD-10-CM | POA: Diagnosis not present

## 2013-09-06 DIAGNOSIS — X58XXXA Exposure to other specified factors, initial encounter: Secondary | ICD-10-CM | POA: Diagnosis not present

## 2013-09-06 DIAGNOSIS — S81009A Unspecified open wound, unspecified knee, initial encounter: Secondary | ICD-10-CM | POA: Insufficient documentation

## 2013-09-06 DIAGNOSIS — T148XXA Other injury of unspecified body region, initial encounter: Secondary | ICD-10-CM | POA: Diagnosis not present

## 2013-09-07 NOTE — Progress Notes (Signed)
[facility]  NAMELOUCILE, POSNER NO.:  [id number]  MEDICAL RECORD NO.:  [id number]      DATE OF BIRTH:  12/30/1932  PHYSICIAN:  Judene Companion, M.D.           VISIT DATE:                                  OFFICE VISIT   SUBJECTIVE:  This is a 80 year old lady, who comes to [place] because of traumatic wound to her left leg on the anterior aspect.  She has a trauma wound right down to the subcu that is through her skin.  The skin is necrotic, which I debrided away.  It is about 2.5 cm in diameter. This lady has a history of hypothyroidism, osteopenia, colonic diverticulosis, hypertension, and fibrocystic disease.  MEDICINES:  Xanax, Proventil, Tessalon, Symbicort, calcium carbonate, vitamins, and cyanocobalamin.  She is also on Voltaren, tramadol, Nexium, Lasix, and Crestor.  OBJECTIVE:  VITAL SIGNS:  When she came here today she was found to have a blood pressure of 169/92, pulse is 96, temperature 97.  She weighs 160 pounds.  I debrided this and got it fairly clean and then I felt like we could treat it with silver alginate.  I could not feel any pulses on this lady's feet, so we sent her for a vascular consult.  DIAGNOSES: 1. Traumatic wound, anterior aspect left leg. 2. Hypertension. 3. Hypothyroidism. 4. Hyperlipidemia. 5. Hypertension. 6. Gastroesophageal reflux disease.     Judene Companion, M.D.     PP/MEDQ  D:  09/06/2013  T:  09/07/2013  Job:  [id number]

## 2013-09-09 ENCOUNTER — Other Ambulatory Visit: Payer: Self-pay | Admitting: *Deleted

## 2013-09-09 ENCOUNTER — Encounter: Payer: Self-pay | Admitting: Surgery

## 2013-09-09 DIAGNOSIS — I739 Peripheral vascular disease, unspecified: Secondary | ICD-10-CM

## 2013-09-12 ENCOUNTER — Other Ambulatory Visit: Payer: Self-pay | Admitting: *Deleted

## 2013-09-12 ENCOUNTER — Ambulatory Visit (INDEPENDENT_AMBULATORY_CARE_PROVIDER_SITE_OTHER): Payer: Medicare Other | Admitting: Surgery

## 2013-09-12 ENCOUNTER — Ambulatory Visit (HOSPITAL_COMMUNITY)
Admission: RE | Admit: 2013-09-12 | Discharge: 2013-09-12 | Disposition: A | Discharge status: Home / Self Care | Payer: Medicare Other | Source: Ambulatory Visit | Attending: Surgery | Admitting: Surgery

## 2013-09-12 ENCOUNTER — Encounter: Payer: Self-pay | Admitting: Surgery

## 2013-09-12 ENCOUNTER — Ambulatory Visit (INDEPENDENT_AMBULATORY_CARE_PROVIDER_SITE_OTHER)
Admission: RE | Admit: 2013-09-12 | Discharge: 2013-09-12 | Disposition: A | Discharge status: Home / Self Care | Payer: Medicare Other | Source: Ambulatory Visit | Attending: Surgery | Admitting: Surgery

## 2013-09-12 ENCOUNTER — Ambulatory Visit (INDEPENDENT_AMBULATORY_CARE_PROVIDER_SITE_OTHER): Payer: Medicare Other

## 2013-09-12 VITALS — BP 145/55 | HR 97 | Ht 64.0 in | Wt 155.8 lb

## 2013-09-12 DIAGNOSIS — M79609 Pain in unspecified limb: Secondary | ICD-10-CM | POA: Insufficient documentation

## 2013-09-12 DIAGNOSIS — J45909 Unspecified asthma, uncomplicated: Secondary | ICD-10-CM | POA: Diagnosis not present

## 2013-09-12 DIAGNOSIS — I739 Peripheral vascular disease, unspecified: Secondary | ICD-10-CM | POA: Diagnosis not present

## 2013-09-12 MED ORDER — OMALIZUMAB 150 MG ~~LOC~~ SOLR
300.0000 mg | Freq: Once | SUBCUTANEOUS | Status: AC
  Administered 2013-09-12: 300 mg via SUBCUTANEOUS

## 2013-09-12 NOTE — Progress Notes (Signed)
Referred by:  Jerlyn Ly, MD [address] [place], [place] [address]  Reason for referral: Swollen left leg with chronic ulcer anterior shin.  History of Present Illness  [name] is a 80 y.o. (1932-08-13) female who presents with chief complaint: swollen leg.  Patient notes, onset of swelling 8 months ago, associated with anterior leg ulcer.  The patient's symptoms include: non healing ulcer.  The patient has had a history of DVT, a history of pregnancy, no history of varicose vein, no history of venous stasis ulcers, no history of  Lymphedema and positive history of skin changes in lower legs.  There is a family history of venous disorders.  The patient has  used compression stockings in the past.  Past Medical History  Diagnosis Date  . Restless legs syndrome (RLS)   . Osteoporosis, unspecified     Knee and hip osteoarthritis bilaterally  . Unspecified essential hypertension   . Other diseases of vocal cords   . Unspecified asthma(493.90)   . Esophageal reflux   . Allergic rhinitis, cause unspecified   . Obstructive chronic bronchitis without exacerbation   . Hyperlipidemia   . Gallbladder disease   . Degenerative disk disease   . Perennial allergic rhinitis   . Diverticulosis   . Lymphedema     in the right arm  . Osteopenia   . Shingles 2007  . Chronic kidney disease, stage II (mild)   . Dysrhythmia     "skips a beat sometimes"  . Pneumonia     x2  . Cancer     right breast  . Blood clot in vein     behind right knee  . DVT (deep venous thrombosis)     Past Surgical History  Procedure Laterality Date  . Cardiac catheterization  08/012008    normal -- EF of 65%  . Mastectomy  1989    right  . Mastectomy  1990    left  . Back surgery  1983    lumbar procedure  . Back surgery  1981    lumbar procedure  . Total abdominal hysterectomy w/ bilateral salpingoophorectomy    . Cholecystectomy  1983  . Cataract extraction Bilateral   . Total knee  arthroplasty Right 10/05/2012    Procedure: RIGHT TOTAL KNEE ARTHROPLASTY;  Surgeon: Mauri Pole, MD;  Location: [facility];  Service: Orthopedics;  Laterality: Right;  . Joint replacement      History   Social History  . Marital Status: Married    Spouse Name: N/A    Number of Children: N/A  . Years of Education: N/A   Occupational History  . Not on file.   Social History Main Topics  . Smoking status: Never Smoker   . Smokeless tobacco: Never Used  . Alcohol Use: No  . Drug Use: No  . Sexual Activity: Not on file   Other Topics Concern  . Not on file   Social History Narrative  . No narrative on file    Family History  Problem Relation Age of Onset  . Heart attack Mother   . Asthma Mother   . Heart disease Mother   . Hyperlipidemia Mother   . Heart attack Father   . Heart failure Father   . Deep vein thrombosis Father   . Heart disease Father   . Peripheral vascular disease Father     amputation  . Cancer Brother   . Asthma Brother   . Coronary artery disease  Sister   . Heart disease Sister       Current Outpatient Prescriptions on File Prior to Visit  Medication Sig Dispense Refill  . albuterol (PROVENTIL HFA;VENTOLIN HFA) 108 (90 BASE) MCG/ACT inhaler Inhale 2 puffs into the lungs every 6 (six) hours as needed.  3 Inhaler  1  . albuterol (PROVENTIL) (2.5 MG/3ML) 0.083% nebulizer solution Take 3 mLs (2.5 mg total) by nebulization every 6 (six) hours as needed for wheezing or shortness of breath.  75 mL  12  . ALPRAZolam (XANAX) 0.5 MG tablet Take 0.5 mg by mouth 2 (two) times daily as needed for anxiety.       [name] azelastine (ASTELIN) 137 MCG/SPRAY nasal spray Place 2 sprays into the nose 2 (two) times daily. Use in each nostril as directed  90 mL  1  . benzonatate (TESSALON) 100 MG capsule Take 100 mg by mouth 3 (three) times daily as needed for cough.      . Calcium Carbonate-Vitamin D (CALTRATE 600+D) 600-400 MG-UNIT per tablet Take 1 tablet by mouth daily.        . chlorpheniramine (CHLOR-TRIMETON) 4 MG tablet Take by mouth. 4 mg in the morning and 8 mg at bedtime      . Cholecalciferol (VITAMIN D3) 1000 UNITS tablet Take 1,000 Units by mouth daily.        [name] dextromethorphan (DELSYM) 30 MG/5ML liquid Take 60 mg by mouth 2 (two) times daily as needed for cough.      . diclofenac sodium (VOLTAREN) 1 % GEL Apply topically as needed.      [name] EPINEPHrine (EPI-PEN) 0.3 mg/0.3 mL SOAJ injection Inject 0.3 mLs (0.3 mg total) into the muscle once.  2 Device  1  . esomeprazole (NEXIUM) 40 MG capsule TAKE 1 CAPSULE TWICE A DAY  180 capsule  2  . fexofenadine (ALLEGRA) 180 MG tablet Take 180 mg by mouth daily.        . furosemide (LASIX) 40 MG tablet Take 0.5 tablets (20 mg total) by mouth every morning.  45 tablet  3  . mometasone (NASONEX) 50 MCG/ACT nasal spray Place 2 sprays into the nose 2 (two) times daily.  3 g  1  . mometasone (NASONEX) 50 MCG/ACT nasal spray Place 2 sprays into the nose 2 (two) times daily.  51 g  1  . montelukast (SINGULAIR) 10 MG tablet Take 1 tablet (10 mg total) by mouth at bedtime.  90 tablet  4  . mupirocin ointment (BACTROBAN) 2 % daily.      [name] omalizumab (XOLAIR) 150 MG injection Inject 150 mg into the skin every 14 (fourteen) days.       . polyethylene glycol (MIRALAX / GLYCOLAX) packet Take 17 g by mouth as needed.      . potassium chloride (K-DUR,KLOR-CON) 10 MEQ tablet Take 1 tablet (10 mEq total) by mouth daily.  90 tablet  3  . pramipexole (MIRAPEX) 0.125 MG tablet Take 0.375 mg by mouth at bedtime.       . rosuvastatin (CRESTOR) 5 MG tablet Take 1 tablet (5 mg total) by mouth at bedtime.  90 tablet  3  . Spacer/Aero-Holding Chambers (AEROCHAMBER MV) inhaler by Other route. Use as instructed       . SYMBICORT 160-4.5 MCG/ACT inhaler INHALE 2 PUFFS INTO THE LUNGS TWICE A DAY  3 Inhaler  3  . telmisartan (MICARDIS) 40 MG tablet Take 0.5 tablets (20 mg total) by mouth at bedtime.  45 tablet  6  .  traMADol (ULTRAM) 50 MG  tablet Take 1 tablet by mouth as needed.       . trolamine salicylate (ASPERCREME) 10 % cream Apply 1 application topically 2 (two) times daily as needed (pain).       . vitamin B-12 (CYANOCOBALAMIN) 500 MCG tablet Take 500 mcg by mouth daily.         No current facility-administered medications on file prior to visit.    Allergies  Allergen Reactions  . Cephalosporins     REACTION: rash  . Codeine     REACTION: rash  . Eggs Or Egg-Derived Products   . Influenza Vaccines   . Levofloxacin     REACTION: rash  . Penicillins     REACTION: rash  . Pneumococcal Vaccines Rash      REVIEW OF SYSTEMS:  (Positives checked otherwise negative)  CARDIOVASCULAR:  []  chest pain, []  chest pressure, []  palpitations, []  shortness of breath when laying flat, []  shortness of breath with exertion,  []  pain in feet when walking, []  pain in feet when laying flat, []  history of blood clot in veins (DVT), []  history of phlebitis, []  swelling in legs, []  varicose veins  PULMONARY:  []  productive cough, [x]  asthma, []  wheezing  NEUROLOGIC:  []  weakness in arms or legs, []  numbness in arms or legs, []  difficulty speaking or slurred speech, []  temporary loss of vision in one eye, []  dizziness  HEMATOLOGIC:  []  bleeding problems, []  problems with blood clotting too easily  MUSCULOSKEL:  []  joint pain, []  joint swelling  GASTROINTEST:  []  vomiting blood, []  blood in stool     GENITOURINARY:  []  burning with urination, []  blood in urine  PSYCHIATRIC:  []  history of major depression  INTEGUMENTARY:  []  rashes, [x]  ulcers  CONSTITUTIONAL:  []  fever, []  chills   Physical Examination Filed Vitals:   09/12/13 1335  BP: 145/55  Pulse: 97  Height: 5\' 4"  (1.626 m)  Weight: 155 lb 12.8 oz (70.67 kg)  SpO2: 100%   Body mass index is 26.73 kg/(m^2).  General: A&O x 3, WDWN  Head: [place] Ear/Nose/Throat: Hearing grossly intact, nares w/o erythema or drainage, oropharynx w/o Erythema/Exudate  Eyes:  PERRLA, EOMI Neck: Supple, no nuchal rigidity Pulmonary: Sym exp, good air movt, CTAB, no rales, rhonchi, & wheezing Cardiac: RRR Vascular: Vessel Right Left  Radial Palpable Palpable  Brachial Palpable Palpable  Carotid Palpable, without bruit Palpable, without bruit  Aorta Not palpable N/A  Femoral Palpable Palpable  Popliteal Not palpable Not palpable  PT Not Palpable  notPalpable  DP Palpable Palpable   Gastrointestinal: soft, NTND Pos BS  Musculoskeletal: M/S 5/5 throughout Extremities without ischemic changes  Neurologic: CN 2-12 intact Psychiatric: Judgment intact, Mood & affect appropriate for pt's clinical situation  Dermatologic: See M/S exam for extremity exam, no rashes otherwise noted  Lymph : No Cervical Non-Invasive Vascular Imaging  BLE Venous Duplex (Date: 09/12/2013):   RLE: no DVT  LLE: no DVT  BLE Venous Insufficiency Duplex (Date: 09/12/2013):  Left Deep femoral mid portion reflux, Common femoral vein, saphofenofemmoral junction and great saphenous GSV 0.55 proximal, mid 0.32  Right Common femoral vein, popliteal, and saphofenofemmoral junction  Reflux SFJ 0.49    Arterial Duplex  Triphasic flow bilateral ABI Right 1.09 tbi >0.70 Left 1.03 tbi >0.70      Medical Decision Making  Robena Hail is a 80 y.o. female who presents with: Non healing ulcer LE chronic.  She has normal arterial blood flow in  bilateral LE.     [facility] Office: [phone]   09/12/2013, 2:12 PM   I agree with the above.  The patient has been seen and examined.  She has a chronic nonhealing left leg ulcer.  There is concern for vascular disease.  Of note, the patient has been receiving steroid injections into the wound which has not healed.  She has been evaluated by the wound center who referred her to [place].  On examination the wound is approximately 1 cm deep and slightly larger in diameter.  There is no surrounding  erythema.  I have reviewed her vascular lab studies.  She has a normal ankle-brachial index with triphasic waveforms.  In addition, she has a palpable dorsalis pedis pulse on the left which is her affected leg.  I also ordered venous insufficiency evaluation.  The patient does have deep vein reflux.  There is minimal superficial system reflux.  There is no evidence of arterial insufficiency in this patient contributing to her wound difficulties.  There is mild venous insufficiency.  She does have some edema and may benefit from some form of compression.  I think her best option is going to be adequate debridement of her wound, discontinuation of steroid injections, and continuous wound care until this heals.  Please contact me if any further questions arise.  Annamarie Major

## 2013-09-13 ENCOUNTER — Encounter (HOSPITAL_BASED_OUTPATIENT_CLINIC_OR_DEPARTMENT_OTHER): Payer: Medicare Other | Attending: General Surgery

## 2013-09-13 DIAGNOSIS — L97909 Non-pressure chronic ulcer of unspecified part of unspecified lower leg with unspecified severity: Principal | ICD-10-CM | POA: Insufficient documentation

## 2013-09-13 DIAGNOSIS — L97809 Non-pressure chronic ulcer of other part of unspecified lower leg with unspecified severity: Secondary | ICD-10-CM | POA: Diagnosis not present

## 2013-09-13 DIAGNOSIS — S99919A Unspecified injury of unspecified ankle, initial encounter: Secondary | ICD-10-CM | POA: Diagnosis not present

## 2013-09-13 DIAGNOSIS — L089 Local infection of the skin and subcutaneous tissue, unspecified: Secondary | ICD-10-CM | POA: Insufficient documentation

## 2013-09-13 DIAGNOSIS — S8990XA Unspecified injury of unspecified lower leg, initial encounter: Secondary | ICD-10-CM | POA: Diagnosis not present

## 2013-09-13 DIAGNOSIS — T148XXA Other injury of unspecified body region, initial encounter: Secondary | ICD-10-CM

## 2013-09-13 DIAGNOSIS — I87319 Chronic venous hypertension (idiopathic) with ulcer of unspecified lower extremity: Secondary | ICD-10-CM | POA: Insufficient documentation

## 2013-09-13 DIAGNOSIS — L97209 Non-pressure chronic ulcer of unspecified calf with unspecified severity: Secondary | ICD-10-CM | POA: Insufficient documentation

## 2013-09-20 DIAGNOSIS — S99929A Unspecified injury of unspecified foot, initial encounter: Secondary | ICD-10-CM | POA: Diagnosis not present

## 2013-09-20 DIAGNOSIS — L97809 Non-pressure chronic ulcer of other part of unspecified lower leg with unspecified severity: Secondary | ICD-10-CM | POA: Diagnosis not present

## 2013-09-20 DIAGNOSIS — S8990XA Unspecified injury of unspecified lower leg, initial encounter: Secondary | ICD-10-CM | POA: Diagnosis not present

## 2013-09-26 ENCOUNTER — Ambulatory Visit (INDEPENDENT_AMBULATORY_CARE_PROVIDER_SITE_OTHER): Payer: Medicare Other

## 2013-09-26 DIAGNOSIS — J45909 Unspecified asthma, uncomplicated: Secondary | ICD-10-CM

## 2013-09-27 DIAGNOSIS — S8990XA Unspecified injury of unspecified lower leg, initial encounter: Secondary | ICD-10-CM | POA: Diagnosis not present

## 2013-09-27 DIAGNOSIS — L97809 Non-pressure chronic ulcer of other part of unspecified lower leg with unspecified severity: Secondary | ICD-10-CM | POA: Diagnosis not present

## 2013-09-27 DIAGNOSIS — S99919A Unspecified injury of unspecified ankle, initial encounter: Secondary | ICD-10-CM | POA: Diagnosis not present

## 2013-09-28 MED ORDER — OMALIZUMAB 150 MG ~~LOC~~ SOLR
300.0000 mg | Freq: Once | SUBCUTANEOUS | Status: AC
Start: 2013-09-28 — End: 2013-09-28
  Administered 2013-09-28: 300 mg via SUBCUTANEOUS

## 2013-09-30 ENCOUNTER — Ambulatory Visit (INDEPENDENT_AMBULATORY_CARE_PROVIDER_SITE_OTHER): Payer: Medicare Other | Admitting: Critical Care Medicine

## 2013-09-30 ENCOUNTER — Encounter: Payer: Self-pay | Admitting: Critical Care Medicine

## 2013-09-30 VITALS — BP 150/78 | HR 81 | Temp 97.1°F | Ht 64.75 in | Wt 160.8 lb

## 2013-09-30 DIAGNOSIS — J4550 Severe persistent asthma, uncomplicated: Secondary | ICD-10-CM

## 2013-09-30 DIAGNOSIS — J45909 Unspecified asthma, uncomplicated: Secondary | ICD-10-CM

## 2013-09-30 MED ORDER — TRIAMCINOLONE ACETONIDE 0.5 % EX CREA: 1.0000 "application " | TOPICAL_CREAM | Freq: Three times a day (TID) | CUTANEOUS | Status: DC

## 2013-09-30 NOTE — Progress Notes (Signed)
Subjective:    Patient ID: [name], female    DOB: May 05, 1933, 80 y.o.   MRN: [id number]  HPI  80 y.o. with moderate persistent asthma with severe atopy   09/30/2013 Chief Complaint  Patient presents with  . 3 month follow up    Had flare up x 1 month ago.  Is still coughing some - with occas clear to white mucus.  No wheezing, chest tightness, or chest pain at this time.  Pt exposed to dust cleaning on 4/25: symptoms severe cough and clear mucus only.  Pt toughed out the symptoms. Now is better.  Cough is less.  Now is better Used neb med for two days and did not help Pt going to wound center weekly for LLE wound    Review of Systems  Constitutional:   No  weight loss, night sweats,  Fevers, chills, fatigue, or  lassitude.  HEENT:   No headaches,  Difficulty swallowing,  Tooth/dental problems, or  Sore throat,                No sneezing, itching, ear ache,  +nasal congestion, post nasal drip,   CV:  No chest pain,  Orthopnea, PND, swelling in lower extremities, anasarca, dizziness, palpitations, syncope.   GI  No heartburn, indigestion, abdominal pain, nausea, vomiting, diarrhea, change in bowel habits, loss of appetite, bloody stools.   Resp:  No chest wall deformity  Skin: no rash or lesions.  GU: no dysuria, change in color of urine, no urgency or frequency.  No flank pain, no hematuria   MS:  No joint pain or swelling.  No decreased range of motion.  No back pain.  Psych:  No change in mood or affect. No depression or anxiety.  No memory loss.     Objective:   Physical Exam BP 150/78  Pulse 81  Temp(Src) 97.1 F (36.2 C) (Oral)  Ht 5' 4.75" (1.645 m)  Wt 160 lb 12.8 oz (72.938 kg)  BMI 26.95 kg/m2  SpO2 98%  GEN: A/Ox3; pleasant , NAD, well nourished   HEENT:  [place]/AT,  EACs-clear, TMs-wnl, NOSE-clear drainage  THROAT-clear, no lesions, no postnasal drip or exudate noted.   NECK:  Supple w/ fair ROM; no JVD; normal carotid impulses w/o bruits; no  thyromegaly or nodules palpated; no lymphadenopathy.  RESP  Clear  P & A; w/o, wheezes/ rales/ or rhonchi.no accessory muscle use, no dullness to percussion  CARD:  RRR, no m/r/g  , no peripheral edema, pulses intact, no cyanosis or clubbing.  GI:   Soft & nt; nml bowel sounds; no organomegaly or masses detected.  Musco: Warm bil, no deformities or joint swelling noted.   Neuro: alert, no focal deficits noted.    Skin: Warm, no lesions or rashes     Assessment & Plan:   Severe persistent asthma Severe persistent asthma stable at this time Plan Maintain inhaled medications as prescribed Maintain nasal steroid    Updated Medication List Outpatient Encounter Prescriptions as of 09/30/2013  Medication Sig  . albuterol (PROVENTIL HFA;VENTOLIN HFA) 108 (90 BASE) MCG/ACT inhaler Inhale 2 puffs into the lungs every 6 (six) hours as needed.  [name] albuterol (PROVENTIL) (2.5 MG/3ML) 0.083% nebulizer solution Take 3 mLs (2.5 mg total) by nebulization every 6 (six) hours as needed for wheezing or shortness of breath.  . ALPRAZolam (XANAX) 0.5 MG tablet Take 0.5 mg by mouth 2 (two) times daily as needed for anxiety.   [name] azelastine (ASTELIN) 137 MCG/SPRAY nasal spray  Place 2 sprays into the nose 2 (two) times daily. Use in each nostril as directed  . benzonatate (TESSALON) 100 MG capsule Take 100 mg by mouth 3 (three) times daily as needed for cough.  . Calcium Carbonate-Vitamin D (CALTRATE 600+D) 600-400 MG-UNIT per tablet Take 1 tablet by mouth daily.   . chlorpheniramine (CHLOR-TRIMETON) 4 MG tablet Take by mouth. 4 mg in the morning and 8 mg at bedtime  . Cholecalciferol (VITAMIN D3) 1000 UNITS tablet Take 1,000 Units by mouth daily.    [name] dextromethorphan (DELSYM) 30 MG/5ML liquid Take 60 mg by mouth 2 (two) times daily as needed for cough.  . diclofenac sodium (VOLTAREN) 1 % GEL Apply topically as needed.  [name] EPINEPHrine (EPI-PEN) 0.3 mg/0.3 mL SOAJ injection Inject 0.3 mLs (0.3 mg total) into  the muscle once.  [name] esomeprazole (NEXIUM) 40 MG capsule TAKE 1 CAPSULE TWICE A DAY  . fexofenadine (ALLEGRA) 180 MG tablet Take 180 mg by mouth daily.    . furosemide (LASIX) 40 MG tablet Take 0.5 tablets (20 mg total) by mouth every morning.  . ibandronate (BONIVA) 150 MG tablet Take 1 tablet by mouth every 30 (thirty) days.  . mometasone (NASONEX) 50 MCG/ACT nasal spray Place 2 sprays into the nose 2 (two) times daily.  . montelukast (SINGULAIR) 10 MG tablet Take 1 tablet (10 mg total) by mouth at bedtime.  [name] omalizumab (XOLAIR) 150 MG injection Inject 150 mg into the skin every 14 (fourteen) days.   . potassium chloride (K-DUR,KLOR-CON) 10 MEQ tablet Take 1 tablet (10 mEq total) by mouth daily.  . pramipexole (MIRAPEX) 0.125 MG tablet Take 0.375 mg by mouth at bedtime.   . rosuvastatin (CRESTOR) 5 MG tablet Take 1 tablet (5 mg total) by mouth at bedtime.  [name] SANTYL ointment every other day. To left leg as directed  . Spacer/Aero-Holding Chambers (AEROCHAMBER MV) inhaler by Other route. Use as instructed   . SYMBICORT 160-4.5 MCG/ACT inhaler INHALE 2 PUFFS INTO THE LUNGS TWICE A DAY  . telmisartan (MICARDIS) 40 MG tablet Take 0.5 tablets (20 mg total) by mouth at bedtime.  . traMADol (ULTRAM) 50 MG tablet Take 1 tablet by mouth as needed.   . trolamine salicylate (ASPERCREME) 10 % cream Apply 1 application topically 2 (two) times daily as needed (pain).   . vitamin B-12 (CYANOCOBALAMIN) 500 MCG tablet Take 500 mcg by mouth daily.    . [DISCONTINUED] mometasone (NASONEX) 50 MCG/ACT nasal spray Place 2 sprays into the nose 2 (two) times daily.  [name] triamcinolone cream (KENALOG) 0.5 % Apply 1 application topically 3 (three) times daily.  . [DISCONTINUED] mupirocin ointment (BACTROBAN) 2 % daily.  . [DISCONTINUED] polyethylene glycol (MIRALAX / GLYCOLAX) packet Take 17 g by mouth as needed.

## 2013-09-30 NOTE — Patient Instructions (Signed)
Try triamcinolone cream to affected areas on arms 2-3 times per day No other changes Return 3 months

## 2013-09-30 NOTE — Assessment & Plan Note (Signed)
Severe persistent asthma stable at this time Plan Maintain inhaled medications as prescribed Maintain nasal steroid

## 2013-10-04 DIAGNOSIS — S8990XA Unspecified injury of unspecified lower leg, initial encounter: Secondary | ICD-10-CM | POA: Diagnosis not present

## 2013-10-04 DIAGNOSIS — L97809 Non-pressure chronic ulcer of other part of unspecified lower leg with unspecified severity: Secondary | ICD-10-CM | POA: Diagnosis not present

## 2013-10-10 ENCOUNTER — Ambulatory Visit (INDEPENDENT_AMBULATORY_CARE_PROVIDER_SITE_OTHER): Payer: Medicare Other

## 2013-10-10 DIAGNOSIS — J45909 Unspecified asthma, uncomplicated: Secondary | ICD-10-CM | POA: Diagnosis not present

## 2013-10-10 DIAGNOSIS — J4550 Severe persistent asthma, uncomplicated: Secondary | ICD-10-CM

## 2013-10-11 ENCOUNTER — Encounter (HOSPITAL_BASED_OUTPATIENT_CLINIC_OR_DEPARTMENT_OTHER): Payer: Medicare Other | Attending: General Surgery

## 2013-10-11 ENCOUNTER — Encounter: Payer: Self-pay | Admitting: Internal Medicine

## 2013-10-11 DIAGNOSIS — E785 Hyperlipidemia, unspecified: Secondary | ICD-10-CM | POA: Diagnosis not present

## 2013-10-11 DIAGNOSIS — K219 Gastro-esophageal reflux disease without esophagitis: Secondary | ICD-10-CM | POA: Insufficient documentation

## 2013-10-11 DIAGNOSIS — E039 Hypothyroidism, unspecified: Secondary | ICD-10-CM | POA: Diagnosis not present

## 2013-10-11 DIAGNOSIS — Z79899 Other long term (current) drug therapy: Secondary | ICD-10-CM | POA: Diagnosis not present

## 2013-10-11 DIAGNOSIS — L97809 Non-pressure chronic ulcer of other part of unspecified lower leg with unspecified severity: Secondary | ICD-10-CM | POA: Insufficient documentation

## 2013-10-11 DIAGNOSIS — Z86718 Personal history of other venous thrombosis and embolism: Secondary | ICD-10-CM | POA: Diagnosis not present

## 2013-10-11 DIAGNOSIS — I10 Essential (primary) hypertension: Secondary | ICD-10-CM | POA: Insufficient documentation

## 2013-10-12 MED ORDER — OMALIZUMAB 150 MG ~~LOC~~ SOLR
300.0000 mg | Freq: Once | SUBCUTANEOUS | Status: AC
Start: 2013-10-12 — End: 2013-10-12
  Administered 2013-10-12: 300 mg via SUBCUTANEOUS

## 2013-10-13 DIAGNOSIS — Z96659 Presence of unspecified artificial knee joint: Secondary | ICD-10-CM | POA: Diagnosis not present

## 2013-10-17 DIAGNOSIS — E785 Hyperlipidemia, unspecified: Secondary | ICD-10-CM | POA: Diagnosis not present

## 2013-10-17 DIAGNOSIS — I10 Essential (primary) hypertension: Secondary | ICD-10-CM | POA: Diagnosis not present

## 2013-10-17 DIAGNOSIS — K219 Gastro-esophageal reflux disease without esophagitis: Secondary | ICD-10-CM | POA: Diagnosis not present

## 2013-10-17 DIAGNOSIS — L97809 Non-pressure chronic ulcer of other part of unspecified lower leg with unspecified severity: Secondary | ICD-10-CM | POA: Diagnosis not present

## 2013-10-18 ENCOUNTER — Encounter (HOSPITAL_BASED_OUTPATIENT_CLINIC_OR_DEPARTMENT_OTHER): Payer: Self-pay | Admitting: *Deleted

## 2013-10-18 NOTE — Progress Notes (Signed)
[facility]  NAMERAIZA, KIESEL             ACCOUNT NO.:  [id number]  MEDICAL RECORD NO.:  [id number]      DATE OF BIRTH:  27-Aug-1932  PHYSICIAN:  Theodoro Kos, DO       VISIT DATE:  10/17/2013                                  OFFICE VISIT   HISTORY OF PRESENT ILLNESS:  The patient is a 80 year old female who is here for followup on her left lower leg ulcer secondary to traumatic debridement.  She states that the lesion had been present for 10 years, but then was debrided and started to drain and developed into a large wound.  She has been using Santyl and Hydrogel on the area.  It does not seem to be making much improvement.  She has been seen here at the [facility] and referred to me for a second opinion.  PAST MEDICAL HISTORY:  Fibrocystic breast disease, hypertension, diverticulosis, gastroesophageal reflux, hyperlipidemia, shingles, hypothyroidism, osteopenia, osteoarthritis, deep venous thrombosis, eczema, asthma, breast cancer.  PAST SURGICAL HISTORY:  Mastectomy, cardiac cath, foot surgery, cataract, knee surgery, and back surgery.  MEDICATIONS:  Xanax, Proventil, Astelin, Tessalon, Symbicort, calcium, vitamin B, Delsym, Voltaren, Nexium, Allegra, Lasix, Nasonex, Singulair, Bactroban, MiraLax, potassium, Crestor, Ultram, Aspercreme, Micardis.  ALLERGIES:  CODEINE, CEPHALOSPORIN, LEVOFLOXACIN, PENICILLIN, AND POSSIBLE PNEUMOCOCCAL PNEUMONIA.  REVIEW OF SYSTEMS:  Otherwise, negative.  Her chronic conditions are at this point stable.  PHYSICAL EXAMINATION:  GENERAL:  She is alert and oriented, not in any acute distress. HEENT:  Pupils are equal.  Extraocular muscles are intact. RESPIRATORY:  Her breathing is unlabored at this point. EXTREMITIES:  Pulses present in the left lower extremity.  The wound sizes are noted in the chart.  Debridement was done.  At this point, she is going to need granulation tissue prior to any graft because when  she moved her ankle with plantar flexion and dorsiflexion, it creates gliding and a skin graft would not work in this situation. We discussed this and the plan is for continuing with Santyl and then to the OR with ACell and VAC placement.    Theodoro Kos, DO    CS/MEDQ  D:  10/17/2013  T:  10/18/2013  Job:  [id number]

## 2013-10-19 ENCOUNTER — Encounter (HOSPITAL_BASED_OUTPATIENT_CLINIC_OR_DEPARTMENT_OTHER): Payer: Self-pay | Admitting: *Deleted

## 2013-10-19 NOTE — Progress Notes (Signed)
NPO AFTER MN WITH EXCEPTION CLEAR LIQUIDS UNTIL 0730 (NO CREAM/ MILK PRODUCTS).  ARRIVE AT 1200. NEEDS ISTAT. CURRENT EKG IN CHART AND EPIC.  WILL TAKE NEXIUM, SYMBICORT INHALER, NASAL SPRAYS AM DOS W/ SIPS OF WATER. WILL BRING RESCUE INHALER.

## 2013-10-24 ENCOUNTER — Ambulatory Visit (HOSPITAL_BASED_OUTPATIENT_CLINIC_OR_DEPARTMENT_OTHER): Payer: Medicare Other | Admitting: Anesthesiology

## 2013-10-24 ENCOUNTER — Encounter (HOSPITAL_BASED_OUTPATIENT_CLINIC_OR_DEPARTMENT_OTHER): Payer: Self-pay | Admitting: Anesthesiology

## 2013-10-24 ENCOUNTER — Encounter (HOSPITAL_BASED_OUTPATIENT_CLINIC_OR_DEPARTMENT_OTHER): Payer: Medicare Other | Admitting: Anesthesiology

## 2013-10-24 ENCOUNTER — Ambulatory Visit (INDEPENDENT_AMBULATORY_CARE_PROVIDER_SITE_OTHER): Payer: Medicare Other

## 2013-10-24 ENCOUNTER — Encounter (HOSPITAL_BASED_OUTPATIENT_CLINIC_OR_DEPARTMENT_OTHER)
Admission: RE | Disposition: A | Discharge status: Home / Self Care | Payer: Self-pay | Source: Ambulatory Visit | Attending: Plastic Surgery

## 2013-10-24 ENCOUNTER — Ambulatory Visit (HOSPITAL_BASED_OUTPATIENT_CLINIC_OR_DEPARTMENT_OTHER)
Admission: RE | Admit: 2013-10-24 | Discharge: 2013-10-24 | Disposition: A | Discharge status: Home / Self Care | Payer: Medicare Other | Source: Ambulatory Visit | Attending: Plastic Surgery | Admitting: Plastic Surgery

## 2013-10-24 DIAGNOSIS — L259 Unspecified contact dermatitis, unspecified cause: Secondary | ICD-10-CM | POA: Diagnosis not present

## 2013-10-24 DIAGNOSIS — E785 Hyperlipidemia, unspecified: Secondary | ICD-10-CM | POA: Insufficient documentation

## 2013-10-24 DIAGNOSIS — K219 Gastro-esophageal reflux disease without esophagitis: Secondary | ICD-10-CM | POA: Diagnosis not present

## 2013-10-24 DIAGNOSIS — J45909 Unspecified asthma, uncomplicated: Secondary | ICD-10-CM | POA: Diagnosis not present

## 2013-10-24 DIAGNOSIS — K5730 Diverticulosis of large intestine without perforation or abscess without bleeding: Secondary | ICD-10-CM | POA: Insufficient documentation

## 2013-10-24 DIAGNOSIS — Z885 Allergy status to narcotic agent status: Secondary | ICD-10-CM | POA: Diagnosis not present

## 2013-10-24 DIAGNOSIS — Z881 Allergy status to other antibiotic agents status: Secondary | ICD-10-CM | POA: Insufficient documentation

## 2013-10-24 DIAGNOSIS — Z853 Personal history of malignant neoplasm of breast: Secondary | ICD-10-CM | POA: Insufficient documentation

## 2013-10-24 DIAGNOSIS — I739 Peripheral vascular disease, unspecified: Secondary | ICD-10-CM | POA: Diagnosis not present

## 2013-10-24 DIAGNOSIS — Z79899 Other long term (current) drug therapy: Secondary | ICD-10-CM | POA: Diagnosis not present

## 2013-10-24 DIAGNOSIS — Z880 Allergy status to penicillin: Secondary | ICD-10-CM | POA: Diagnosis not present

## 2013-10-24 DIAGNOSIS — J449 Chronic obstructive pulmonary disease, unspecified: Secondary | ICD-10-CM | POA: Diagnosis not present

## 2013-10-24 DIAGNOSIS — I10 Essential (primary) hypertension: Secondary | ICD-10-CM | POA: Diagnosis not present

## 2013-10-24 DIAGNOSIS — M1990 Unspecified osteoarthritis, unspecified site: Secondary | ICD-10-CM | POA: Diagnosis not present

## 2013-10-24 DIAGNOSIS — L97909 Non-pressure chronic ulcer of unspecified part of unspecified lower leg with unspecified severity: Secondary | ICD-10-CM | POA: Insufficient documentation

## 2013-10-24 DIAGNOSIS — D649 Anemia, unspecified: Secondary | ICD-10-CM | POA: Insufficient documentation

## 2013-10-24 DIAGNOSIS — J4489 Other specified chronic obstructive pulmonary disease: Secondary | ICD-10-CM | POA: Insufficient documentation

## 2013-10-24 HISTORY — DX: Other cervical disc degeneration, unspecified cervical region: M50.30

## 2013-10-24 HISTORY — DX: Non-pressure chronic ulcer of unspecified part of left lower leg with unspecified severity: L97.929

## 2013-10-24 HISTORY — DX: Personal history of other infectious and parasitic diseases: Z86.19

## 2013-10-24 HISTORY — DX: Personal history of other venous thrombosis and embolism: Z86.718

## 2013-10-24 HISTORY — DX: Localized edema: R60.0

## 2013-10-24 HISTORY — DX: Severe persistent asthma, uncomplicated: J45.50

## 2013-10-24 HISTORY — DX: Personal history of malignant neoplasm of breast: Z85.3

## 2013-10-24 HISTORY — DX: Gastro-esophageal reflux disease without esophagitis: K21.9

## 2013-10-24 HISTORY — PX: INCISION AND DRAINAGE OF WOUND: SHX1803

## 2013-10-24 LAB — POCT I-STAT 4, (NA,K, GLUC, HGB,HCT)
Glucose, Bld: 108 mg/dL — ABNORMAL HIGH (ref 70–99)
HEMATOCRIT: 40 % (ref 36.0–46.0)
Hemoglobin: 13.6 g/dL (ref 12.0–15.0)
Potassium: 3.4 mEq/L — ABNORMAL LOW (ref 3.7–5.3)
SODIUM: 139 meq/L (ref 137–147)

## 2013-10-24 SURGERY — IRRIGATION AND DEBRIDEMENT WOUND
Anesthesia: General | Site: Leg Lower | Laterality: Left

## 2013-10-24 MED ORDER — FENTANYL CITRATE 0.05 MG/ML IJ SOLN
25.0000 ug | INTRAMUSCULAR | Status: DC | PRN
  Administered 2013-10-24 (×3): 25 ug via INTRAVENOUS
  Administered 2013-10-24: 50 ug via INTRAVENOUS
  Administered 2013-10-24: 25 ug via INTRAVENOUS
  Filled 2013-10-24: qty 1

## 2013-10-24 MED ORDER — PROMETHAZINE HCL 25 MG/ML IJ SOLN
6.2500 mg | INTRAMUSCULAR | Status: DC | PRN
  Filled 2013-10-24: qty 1

## 2013-10-24 MED ORDER — LACTATED RINGERS IV SOLN
INTRAVENOUS | Status: DC
  Filled 2013-10-24: qty 1000

## 2013-10-24 MED ORDER — HYDROCODONE-ACETAMINOPHEN 5-325 MG PO TABS
1.0000 | ORAL_TABLET | Freq: Once | ORAL | Status: AC
  Administered 2013-10-24: 1 via ORAL
  Filled 2013-10-24: qty 1

## 2013-10-24 MED ORDER — DEXAMETHASONE SODIUM PHOSPHATE 10 MG/ML IJ SOLN
INTRAMUSCULAR | Status: DC | PRN
  Administered 2013-10-24: 10 mg via INTRAVENOUS

## 2013-10-24 MED ORDER — HYDROMORPHONE HCL PF 1 MG/ML IJ SOLN
INTRAMUSCULAR | Status: AC
  Filled 2013-10-24: qty 1

## 2013-10-24 MED ORDER — 0.9 % SODIUM CHLORIDE (POUR BTL) OPTIME
TOPICAL | Status: DC | PRN
  Administered 2013-10-24: 50 mL

## 2013-10-24 MED ORDER — ONDANSETRON HCL 4 MG/2ML IJ SOLN
INTRAMUSCULAR | Status: DC | PRN
  Administered 2013-10-24: 4 mg via INTRAVENOUS

## 2013-10-24 MED ORDER — HYDROCODONE-ACETAMINOPHEN 5-325 MG PO TABS: 1.0000 | ORAL_TABLET | Freq: Four times a day (QID) | ORAL | Status: DC | PRN

## 2013-10-24 MED ORDER — FENTANYL CITRATE 0.05 MG/ML IJ SOLN
INTRAMUSCULAR | Status: DC | PRN
Start: 2013-10-24 — End: 2013-10-24
  Administered 2013-10-24 (×2): 50 ug via INTRAVENOUS

## 2013-10-24 MED ORDER — LIDOCAINE HCL (CARDIAC) 20 MG/ML IV SOLN
INTRAVENOUS | Status: DC | PRN
  Administered 2013-10-24: 80 mg via INTRAVENOUS

## 2013-10-24 MED ORDER — HYDROCODONE-ACETAMINOPHEN 5-325 MG PO TABS
ORAL_TABLET | ORAL | Status: AC
  Filled 2013-10-24: qty 1

## 2013-10-24 MED ORDER — FENTANYL CITRATE 0.05 MG/ML IJ SOLN
INTRAMUSCULAR | Status: AC
  Filled 2013-10-24: qty 2

## 2013-10-24 MED ORDER — PROPOFOL INFUSION 10 MG/ML OPTIME
INTRAVENOUS | Status: DC | PRN
  Administered 2013-10-24: 150 mL via INTRAVENOUS
  Administered 2013-10-24: 50 mL via INTRAVENOUS

## 2013-10-24 MED ORDER — HYDROCODONE-ACETAMINOPHEN 7.5-325 MG PO TABS
1.0000 | ORAL_TABLET | Freq: Four times a day (QID) | ORAL | Status: DC | PRN
  Filled 2013-10-24: qty 1

## 2013-10-24 MED ORDER — FENTANYL CITRATE 0.05 MG/ML IJ SOLN
INTRAMUSCULAR | Status: AC
  Filled 2013-10-24: qty 4

## 2013-10-24 MED ORDER — SODIUM CHLORIDE 0.9 % IV SOLN
INTRAVENOUS | Status: DC
  Administered 2013-10-24: 12:00:00 via INTRAVENOUS
  Filled 2013-10-24: qty 1000

## 2013-10-24 MED ORDER — LACTATED RINGERS IV SOLN
INTRAVENOUS | Status: DC | PRN
  Administered 2013-10-24 (×2): via INTRAVENOUS

## 2013-10-24 MED ORDER — MIDAZOLAM HCL 2 MG/2ML IJ SOLN
INTRAMUSCULAR | Status: AC
  Filled 2013-10-24: qty 2

## 2013-10-24 MED ORDER — EPHEDRINE SULFATE 50 MG/ML IJ SOLN
INTRAMUSCULAR | Status: DC | PRN
  Administered 2013-10-24: 15 mg via INTRAVENOUS

## 2013-10-24 MED ORDER — MIDAZOLAM HCL 5 MG/5ML IJ SOLN
INTRAMUSCULAR | Status: DC | PRN
  Administered 2013-10-24: 2 mg via INTRAVENOUS

## 2013-10-24 MED ORDER — SODIUM CHLORIDE 0.9 % IR SOLN
Status: DC | PRN
  Administered 2013-10-24: 13:00:00

## 2013-10-24 MED ORDER — DOXYCYCLINE HYCLATE 50 MG PO CAPS: 100.0000 mg | ORAL_CAPSULE | Freq: Two times a day (BID) | ORAL | Status: DC

## 2013-10-24 SURGICAL SUPPLY — 43 items
BANDAGE ELASTIC 4 VELCRO ST LF (GAUZE/BANDAGES/DRESSINGS) ×3 IMPLANT
BANDAGE ELASTIC 6 VELCRO ST LF (GAUZE/BANDAGES/DRESSINGS) ×3 IMPLANT
BANDAGE GAUZE ELAST BULKY 4 IN (GAUZE/BANDAGES/DRESSINGS) ×3 IMPLANT
BLADE SURG 10 STRL SS (BLADE) ×6 IMPLANT
BLADE SURG 15 STRL LF DISP TIS (BLADE) ×1 IMPLANT
BLADE SURG 15 STRL SS (BLADE) ×2
CANISTER SUCTION 2500CC (MISCELLANEOUS) ×3 IMPLANT
CONT SPECI 4OZ STER CLIK (MISCELLANEOUS) ×3 IMPLANT
COVER MAYO STAND STRL (DRAPES) ×3 IMPLANT
COVER TABLE BACK 60X90 (DRAPES) ×3 IMPLANT
DRAPE INCISE IOBAN 66X45 STRL (DRAPES) ×3 IMPLANT
DRAPE PED LAPAROTOMY (DRAPES) ×3 IMPLANT
DRSG ADAPTIC 3X8 NADH LF (GAUZE/BANDAGES/DRESSINGS) ×3 IMPLANT
ELECT REM PT RETURN 9FT ADLT (ELECTROSURGICAL) ×3
ELECTRODE REM PT RTRN 9FT ADLT (ELECTROSURGICAL) ×1 IMPLANT
GLOVE BIO SURGEON STRL SZ 6.5 (GLOVE) ×4 IMPLANT
GLOVE BIO SURGEONS STRL SZ 6.5 (GLOVE) ×2
GLOVE BIOGEL M STER SZ 6 (GLOVE) ×3 IMPLANT
GLOVE INDICATOR 6.5 STRL GRN (GLOVE) ×3 IMPLANT
GOWN STRL REUS W/TWL LRG LVL3 (GOWN DISPOSABLE) ×6 IMPLANT
MATRIX WOUND MULITLAYER (Tissue) ×3 IMPLANT
MICROMATRIX 500MG (Tissue) ×3 IMPLANT
NEEDLE 27GAX1X1/2 (NEEDLE) ×3 IMPLANT
NS IRRIG 500ML POUR BTL (IV SOLUTION) ×3 IMPLANT
PACK BASIN DAY SURGERY FS (CUSTOM PROCEDURE TRAY) ×3 IMPLANT
PADDING CAST ABS 3INX4YD NS (CAST SUPPLIES) ×2
PADDING CAST ABS COTTON 3X4 (CAST SUPPLIES) ×1 IMPLANT
PENCIL BUTTON HOLSTER BLD 10FT (ELECTRODE) ×3 IMPLANT
SOLUTION PARTIC MCRMTRX 500MG (Tissue) ×1 IMPLANT
SPLINT PLASTER CAST XFAST 4X15 (CAST SUPPLIES) ×1 IMPLANT
SPLINT PLASTER XTRA FAST SET 4 (CAST SUPPLIES) ×2
SPONGE GAUZE 4X4 12PLY (GAUZE/BANDAGES/DRESSINGS) ×3 IMPLANT
SPONGE LAP 4X18 X RAY DECT (DISPOSABLE) ×3 IMPLANT
SUT ETHILON 2 0 PS N (SUTURE) ×3 IMPLANT
SYR BULB IRRIGATION 50ML (SYRINGE) ×3 IMPLANT
SYR CONTROL 10ML LL (SYRINGE) ×3 IMPLANT
TOWEL OR 17X24 6PK STRL BLUE (TOWEL DISPOSABLE) ×6 IMPLANT
TRAY DSU PREP LF (CUSTOM PROCEDURE TRAY) ×3 IMPLANT
TUBE CONNECTING 12'X1/4 (SUCTIONS) ×1
TUBE CONNECTING 12X1/4 (SUCTIONS) ×2 IMPLANT
UNDERPAD 30X30 INCONTINENT (UNDERPADS AND DIAPERS) ×3 IMPLANT
WATER STERILE IRR 500ML POUR (IV SOLUTION) ×3 IMPLANT
YANKAUER SUCT BULB TIP NO VENT (SUCTIONS) ×3 IMPLANT

## 2013-10-24 NOTE — Brief Op Note (Signed)
10/24/2013  1:36 PM  PATIENT:  [name]  80 y.o. female  PRE-OPERATIVE DIAGNOSIS:  ulcer of the left leg  POST-OPERATIVE DIAGNOSIS:  ulcer of the left leg  PROCEDURE:  Procedure(s): IRRIGATION AND DEBRIDEMENT OF LEFT LEG WITH PLACEMENT OF ACELL AND WOUND VAC (Left)  SURGEON:  Surgeon(s) and Role:    * Adja Ruff Sanger, DO - Primary  PHYSICIAN ASSISTANT: Shawn Rayburn, PA  ASSISTANTS: none   ANESTHESIA:   general  EBL:  Total I/O In: 1000 [I.V.:1000] Out: -   BLOOD ADMINISTERED:none  DRAINS: none   LOCAL MEDICATIONS USED:  NONE  SPECIMEN:  No Specimen  DISPOSITION OF SPECIMEN:  N/A  COUNTS:  YES  TOURNIQUET:  * No tourniquets in log *  DICTATION: .Dragon Dictation  PLAN OF CARE: Discharge to home after PACU  PATIENT DISPOSITION:  PACU - hemodynamically stable.   Delay start of Pharmacological VTE agent (>24hrs) due to surgical blood loss or risk of bleeding: no

## 2013-10-24 NOTE — Op Note (Signed)
Operative Note   DATE OF OPERATION: 10/24/2013  LOCATION: [place]  SURGICAL DIVISION: Plastic Surgery  PREOPERATIVE DIAGNOSES:  Left leg ulcer 6 x 4 x .5cm  POSTOPERATIVE DIAGNOSES:  same  PROCEDURE:  Preparation of left leg ulcer for placement of Acell (500gm powder and sheet) with debridement of skin and muscle.  SURGEON: Theodoro Kos, DO  ASSISTANT: Shawn Rayburn, PA  ANESTHESIA:  General.   COMPLICATIONS: None.   INDICATIONS FOR PROCEDURE:  The patient, [name] is a 80 y.o. female born on 06/21/1932, is here for treatment of a left leg ulcer. MRN: [id number]  CONSENT:  Informed consent was obtained directly from the patient. Risks, benefits and alternatives were fully discussed. Specific risks including but not limited to bleeding, infection, hematoma, seroma, scarring, pain, infection, contracture, asymmetry, wound healing problems, and need for further surgery were all discussed. The patient did have an ample opportunity to have questions answered to satisfaction.   DESCRIPTION OF PROCEDURE:  The patient was taken to the operating room. SCDs were placed. The patient's operative site was prepped and draped in a sterile fashion. A time out was performed and all information was confirmed to be correct.  General anesthesia was administered.  The #10 blade was used to debride the skin around the area and the muscle at the base. The area was irrigated with antibiotic solution.  The wound size is 6 x 4 x .5 cm.  The Acell powder was placed followed by the Acell sheet. The adaptic was placed followed by ky gel and then the [facility] was applied.  The patient tolerated the procedure well.  There were no complications. The patient was allowed to wake from anesthesia, extubated and taken to the recovery room in satisfactory condition.

## 2013-10-24 NOTE — Discharge Instructions (Signed)
Keep VAC in place. Post Anesthesia Home Care Instructions  Activity: Get plenty of rest for the remainder of the day. A responsible adult should stay with you for 24 hours following the procedure.  For the next 24 hours, DO NOT: -Drive a car -Paediatric nurse -Drink alcoholic beverages -Take any medication unless instructed by your physician -Make any legal decisions or sign important papers.  Meals: Start with liquid foods such as gelatin or soup. Progress to regular foods as tolerated. Avoid greasy, spicy, heavy foods. If nausea and/or vomiting occur, drink only clear liquids until the nausea and/or vomiting subsides. Call your physician if vomiting continues.  Special Instructions/Symptoms: Your throat may feel dry or sore from the anesthesia or the breathing tube placed in your throat during surgery. If this causes discomfort, gargle with warm salt water. The discomfort should disappear within 24 hours. Call your surgeon if you experience:   1.  Fever over 101.0. 2.  Inability to urinate. 3.  Nausea and/or vomiting. 4.  Extreme swelling or bruising at the surgical site. 5.  Continued bleeding from the incision. 6.  Increased pain, redness or drainage from the incision. 7.  Problems related to your pain medication.

## 2013-10-24 NOTE — Anesthesia Procedure Notes (Signed)
Procedure Name: LMA Insertion Date/Time: 10/24/2013 1:04 PM Performed by: Wanita Chamberlain Pre-anesthesia Checklist: Patient identified, Timeout performed, Emergency Drugs available, Suction available and Patient being monitored Patient Re-evaluated:Patient Re-evaluated prior to inductionOxygen Delivery Method: Circle system utilized Preoxygenation: Pre-oxygenation with 100% oxygen Intubation Type: IV induction Ventilation: Mask ventilation without difficulty LMA: LMA inserted LMA Size: 4.0 Number of attempts: 1 Placement Confirmation: breath sounds checked- equal and bilateral and positive ETCO2 Dental Injury: Teeth and Oropharynx as per pre-operative assessment

## 2013-10-24 NOTE — Transfer of Care (Signed)
Immediate Anesthesia Transfer of Care Note  Patient: [name]  Procedure(s) Performed: Procedure(s): IRRIGATION AND DEBRIDEMENT OF LEFT LEG WITH PLACEMENT OF ACELL AND WOUND VAC (Left)  Patient Location: PACU  Anesthesia Type:General  Level of Consciousness: awake, alert , oriented and patient cooperative  Airway & Oxygen Therapy: Patient Spontanous Breathing and Patient connected to nasal cannula oxygen  Post-op Assessment: Report given to PACU RN and Post -op Vital signs reviewed and stable  Post vital signs: Reviewed and stable  Complications: No apparent anesthesia complications

## 2013-10-24 NOTE — Anesthesia Preprocedure Evaluation (Addendum)
Anesthesia Evaluation  Patient identified by MRN, date of birth, ID band Patient awake    Airway Mallampati: II TM Distance: >3 FB Neck ROM: Full    Dental  (+) Edentulous Upper, Partial Lower, Dental Advisory Given   Pulmonary asthma ,  breath sounds clear to auscultation  Pulmonary exam normal       Cardiovascular Exercise Tolerance: Good hypertension, Pt. on medications + Peripheral Vascular Disease and DVT Rhythm:Regular Rate:Normal  05-20-2011 2 D ECHO Left ventricle:  The cavity size was normal. Wall thickness was increased in a pattern of mild LVH. The estimated ejection fraction was 60%. Wall motion was normal; there were no regional wall motion abnormalities.   Neuro/Psych negative neurological ROS  negative psych ROS   GI/Hepatic Neg liver ROS, GERD-  Medicated,  Endo/Other  negative endocrine ROS  Renal/GU Renal disease     Musculoskeletal   Abdominal   Peds  Hematology negative hematology ROS (+) anemia ,   Anesthesia Other Findings   Reproductive/Obstetrics                        Anesthesia Physical Anesthesia Plan  ASA: III  Anesthesia Plan: General   Post-op Pain Management:    Induction: Intravenous  Airway Management Planned: LMA  Additional Equipment:   Intra-op Plan:   Post-operative Plan: Extubation in OR  Informed Consent: I have reviewed the patients History and Physical, chart, labs and discussed the procedure including the risks, benefits and alternatives for the proposed anesthesia with the patient or authorized representative who has indicated his/her understanding and acceptance.   Dental advisory given  Plan Discussed with: CRNA  Anesthesia Plan Comments:         Anesthesia Quick Evaluation                                  Anesthesia Evaluation  Patient identified by MRN, date of birth, ID band Patient awake    Reviewed: Allergy &  Precautions, H&P , NPO status , Patient's Chart, lab work & pertinent test results, reviewed documented beta blocker date and time   Airway Mallampati: II TM Distance: >3 FB Neck ROM: full    Dental  (+) Edentulous Upper and Missing Many lower missing teeth:   Pulmonary asthma , COPD COPD inhaler,  Severe asthma.  Chronic bronchitis breath sounds clear to auscultation  Pulmonary exam normal       Cardiovascular hypertension, Pt. on medications Rhythm:regular Rate:Normal     Neuro/Psych Back surgery negative neurological ROS  negative psych ROS   GI/Hepatic negative GI ROS, Neg liver ROS, GERD-  Medicated and Controlled,  Endo/Other  negative endocrine ROS  Renal/GU negative Renal ROS  negative genitourinary   Musculoskeletal   Abdominal   Peds  Hematology negative hematology ROS (+)   Anesthesia Other Findings   Reproductive/Obstetrics negative OB ROS                          Anesthesia Physical Anesthesia Plan  ASA: III  Anesthesia Plan: Spinal   Post-op Pain Management:    Induction:   Airway Management Planned: Simple Face Mask  Additional Equipment:   Intra-op Plan:   Post-operative Plan:   Informed Consent: I have reviewed the patients History and Physical, chart, labs and discussed the procedure including the risks, benefits and alternatives for the proposed anesthesia with the  patient or authorized representative who has indicated his/her understanding and acceptance.   Dental Advisory Given  Plan Discussed with: CRNA and Surgeon  Anesthesia Plan Comments:         Anesthesia Quick Evaluation

## 2013-10-24 NOTE — Interval H&P Note (Signed)
History and Physical Interval Note:  10/24/2013 12:02 PM  [name]  has presented today for surgery, with the diagnosis of ulcer of the left leg  The various methods of treatment have been discussed with the patient and family. After consideration of risks, benefits and other options for treatment, the patient has consented to  Procedure(s): IRRIGATION AND DEBRIDEMENT OF LEFT LEG WITH PLACEMENT OF ACELL AND WOUND VAC (Left) as a surgical intervention .  The patient's history has been reviewed, patient examined, no change in status, stable for surgery.  I have reviewed the patient's chart and labs.  Questions were answered to the patient's satisfaction.     SANGER,Broghan Pannone

## 2013-10-24 NOTE — Anesthesia Postprocedure Evaluation (Signed)
Anesthesia Post Note  Patient: [name]  Procedure(s) Performed: Procedure(s) (LRB): IRRIGATION AND DEBRIDEMENT OF LEFT LEG WITH PLACEMENT OF ACELL AND WOUND VAC (Left)  Anesthesia type: General  Patient location: PACU  Post pain: Pain level controlled  Post assessment: Post-op Vital signs reviewed  Last Vitals:  Filed Vitals:   10/24/13 1445  BP: 114/46  Pulse: 82  Temp:   Resp: 18    Post vital signs: Reviewed  Level of consciousness: sedated  Complications: No apparent anesthesia complications

## 2013-10-24 NOTE — H&P (View-Only) (Signed)
[facility]  NAMEDANEILLE, DESILVA             ACCOUNT NO.:  [id number]  MEDICAL RECORD NO.:  [id number]      DATE OF BIRTH:  01-Mar-1933  PHYSICIAN:  Theodoro Kos, DO       VISIT DATE:  10/17/2013                                  OFFICE VISIT   HISTORY OF PRESENT ILLNESS:  The patient is a 80 year old female who is here for followup on her left lower leg ulcer secondary to traumatic debridement.  She states that the lesion had been present for 10 years, but then was debrided and started to drain and developed into a large wound.  She has been using Santyl and Hydrogel on the area.  It does not seem to be making much improvement.  She has been seen here at the [facility] and referred to me for a second opinion.  PAST MEDICAL HISTORY:  Fibrocystic breast disease, hypertension, diverticulosis, gastroesophageal reflux, hyperlipidemia, shingles, hypothyroidism, osteopenia, osteoarthritis, deep venous thrombosis, eczema, asthma, breast cancer.  PAST SURGICAL HISTORY:  Mastectomy, cardiac cath, foot surgery, cataract, knee surgery, and back surgery.  MEDICATIONS:  Xanax, Proventil, Astelin, Tessalon, Symbicort, calcium, vitamin B, Delsym, Voltaren, Nexium, Allegra, Lasix, Nasonex, Singulair, Bactroban, MiraLax, potassium, Crestor, Ultram, Aspercreme, Micardis.  ALLERGIES:  CODEINE, CEPHALOSPORIN, LEVOFLOXACIN, PENICILLIN, AND POSSIBLE PNEUMOCOCCAL PNEUMONIA.  REVIEW OF SYSTEMS:  Otherwise, negative.  Her chronic conditions are at this point stable.  PHYSICAL EXAMINATION:  GENERAL:  She is alert and oriented, not in any acute distress. HEENT:  Pupils are equal.  Extraocular muscles are intact. RESPIRATORY:  Her breathing is unlabored at this point. EXTREMITIES:  Pulses present in the left lower extremity.  The wound sizes are noted in the chart.  Debridement was done.  At this point, she is going to need granulation tissue prior to any graft because when  she moved her ankle with plantar flexion and dorsiflexion, it creates gliding and a skin graft would not work in this situation. We discussed this and the plan is for continuing with Santyl and then to the OR with ACell and VAC placement.    Theodoro Kos, DO    CS/MEDQ  D:  10/17/2013  T:  10/18/2013  Job:  [id number]

## 2013-10-25 MED ORDER — OMALIZUMAB 150 MG ~~LOC~~ SOLR
300.0000 mg | Freq: Once | SUBCUTANEOUS | Status: AC
  Administered 2013-10-25: 300 mg via SUBCUTANEOUS

## 2013-10-26 ENCOUNTER — Encounter (HOSPITAL_BASED_OUTPATIENT_CLINIC_OR_DEPARTMENT_OTHER): Payer: Self-pay | Admitting: Plastic Surgery

## 2013-11-01 NOTE — Progress Notes (Signed)
[facility]  NAME:  [name], [name]                  ACCOUNT NO.:  MEDICAL RECORD NO.:  [id number]      DATE OF BIRTH:  March 26, 1933  PHYSICIAN:  Theodoro Kos, DO       VISIT DATE:  10/31/2013                                  OFFICE VISIT   The patient is a 80 year old female, who is here for a followup on her surgeries.  She underwent debridement of her left lower leg ulcer with ACell and VAC placement.  She is overall doing well.  The ACell seems to have incorporated both powder and sheet.  There is a little bit of periwound redness and irritation, but the VAC was changed with a new Adaptic placed and some Hydrogel and we will see her back in a week.     Theodoro Kos, DO     CS/MEDQ  D:  10/31/2013  T:  10/31/2013  Job:  [id number]

## 2013-11-07 ENCOUNTER — Ambulatory Visit (INDEPENDENT_AMBULATORY_CARE_PROVIDER_SITE_OTHER): Payer: Medicare Other

## 2013-11-07 DIAGNOSIS — J45909 Unspecified asthma, uncomplicated: Secondary | ICD-10-CM

## 2013-11-07 DIAGNOSIS — J4550 Severe persistent asthma, uncomplicated: Secondary | ICD-10-CM

## 2013-11-07 NOTE — Progress Notes (Signed)
[facility]  [name], [name]             ACCOUNT NO.:  [id number]  MEDICAL RECORD NO.:  [id number]      DATE OF BIRTH:  [birth year]  PHYSICIAN:  Theodoro Kos, DO       VISIT DATE:  11/07/2013                                  OFFICE VISIT   The patient is a 80 year old female who is here for followup on her left lower extremity ulcer secondary to trauma.  She underwent debridement in the OR, and ACell placement with a VAC.  She is doing extremely well. The area is incorporating the ACell well.  There is no sign of infection.  There is a little bit of redness, but overall remarkable improvement with filling in the wound.  We will continue with the [facility] and see her back in a week.     Theodoro Kos, DO     CS/MEDQ  D:  11/07/2013  T:  11/07/2013  Job:  [id number]

## 2013-11-08 MED ORDER — OMALIZUMAB 150 MG ~~LOC~~ SOLR
300.0000 mg | Freq: Once | SUBCUTANEOUS | Status: AC
  Administered 2013-11-08: 300 mg via SUBCUTANEOUS

## 2013-11-14 ENCOUNTER — Encounter (HOSPITAL_BASED_OUTPATIENT_CLINIC_OR_DEPARTMENT_OTHER): Payer: Medicare Other | Attending: Plastic Surgery

## 2013-11-14 DIAGNOSIS — L97809 Non-pressure chronic ulcer of other part of unspecified lower leg with unspecified severity: Secondary | ICD-10-CM | POA: Insufficient documentation

## 2013-11-15 NOTE — Progress Notes (Signed)
[facility]  NAME:  Daffin, Jacob                  ACCOUNT NO.:  MEDICAL RECORD NO.:  [id number]      DATE OF BIRTH:  [birth year]  PHYSICIAN:  Theodoro Kos, DO       VISIT DATE:  11/14/2013                                  OFFICE VISIT   The patient is a 80 year old female who is here for followup on her left leg ulcer.  She has done extremely well with the [facility] and is feeling in beautifully.  She is nearly at surface level, now she just needed to epithelialize.  There is no change in her medications or social history.  REVIEW OF SYSTEMS:  Negative.  The wound on exam is healing well.  There is a little bit of periwound irritation but no sign of infection.  I placed an Endoform and replaced the VAC with an Adaptic and we will see her back in 1 week.     Theodoro Kos, DO     CS/MEDQ  D:  11/14/2013  T:  11/14/2013  Job:  [id number]

## 2013-11-21 ENCOUNTER — Ambulatory Visit (INDEPENDENT_AMBULATORY_CARE_PROVIDER_SITE_OTHER): Payer: Medicare Other | Admitting: Adult Health

## 2013-11-21 ENCOUNTER — Encounter: Payer: Self-pay | Admitting: Adult Health

## 2013-11-21 ENCOUNTER — Ambulatory Visit: Payer: BC Managed Care – PPO

## 2013-11-21 ENCOUNTER — Telehealth: Payer: Self-pay | Admitting: Critical Care Medicine

## 2013-11-21 VITALS — BP 130/62 | HR 95 | Temp 97.4°F | Ht 64.75 in | Wt 156.8 lb

## 2013-11-21 DIAGNOSIS — J45901 Unspecified asthma with (acute) exacerbation: Secondary | ICD-10-CM

## 2013-11-21 DIAGNOSIS — L97809 Non-pressure chronic ulcer of other part of unspecified lower leg with unspecified severity: Secondary | ICD-10-CM | POA: Diagnosis not present

## 2013-11-21 DIAGNOSIS — J4551 Severe persistent asthma with (acute) exacerbation: Secondary | ICD-10-CM

## 2013-11-21 MED ORDER — PREDNISONE 10 MG PO TABS: ORAL_TABLET | ORAL | Status: DC

## 2013-11-21 MED ORDER — OMALIZUMAB 150 MG ~~LOC~~ SOLR
300.0000 mg | Freq: Once | SUBCUTANEOUS | Status: AC
  Administered 2013-11-21: 300 mg via SUBCUTANEOUS

## 2013-11-21 MED ORDER — AZITHROMYCIN 250 MG PO TABS: ORAL_TABLET | ORAL | Status: AC

## 2013-11-21 MED ORDER — METHYLPREDNISOLONE ACETATE 80 MG/ML IJ SUSP
120.0000 mg | Freq: Once | INTRAMUSCULAR | Status: AC
Start: 2013-11-21 — End: 2013-11-21
  Administered 2013-11-21: 120 mg via INTRAMUSCULAR

## 2013-11-21 MED ORDER — METHYLPREDNISOLONE ACETATE 80 MG/ML IJ SUSP
80.0000 mg | Freq: Once | INTRAMUSCULAR | Status: DC
  Administered 2013-11-21: 80 mg via INTRAMUSCULAR

## 2013-11-21 NOTE — Addendum Note (Signed)
Addended by: Lorane Gell on: 11/21/2013 05:21 PM   Modules accepted: Orders

## 2013-11-21 NOTE — Addendum Note (Signed)
Addended by: Parke Poisson E on: 11/21/2013 11:43 AM   Modules accepted: Orders

## 2013-11-21 NOTE — Patient Instructions (Addendum)
Use mucinex DM twice daily for cough A depomedrol 120mg  injection was given Continue with saline nasal rinses.  Zpack take as directed.  Prednsione taper over next week.  Follow up with Dr. Joya Gaskins  In 6 weeks and As needed   Please contact office for sooner follow up if symptoms do not improve or worsen or seek emergency care

## 2013-11-21 NOTE — Progress Notes (Signed)
   Subjective:    Patient ID: [name], female    DOB: 11/20/1932, 80 y.o.   MRN: [id number]  HPI 80 yo with moderate persistent asthma with severe atopy   11/21/2013 Acute OV  Complains of chest congestion, prod cough with clear mucus, head congestion/pressure, PND, hoarseness, increased SOB, wheezing, tightness x3 weeks.  Denies hemoptysis, f/c/s/n/v/d, leg swelling Has a lot of sinus pressure and drainage.  Has been using mucinex and saline for nose without much help Requests depo medrol shot.   Going to wound center for poor healing wound on left leg -has wound vac.      Review of Systems Constitutional:   No  weight loss, night sweats,  Fevers, chills, fatigue, or  lassitude.  HEENT:   No headaches,  Difficulty swallowing,  Tooth/dental problems, or  Sore throat,                No sneezing, itching, ear ache, + nasal congestion, post nasal drip,   CV:  No chest pain,  Orthopnea, PND, swelling in lower extremities, anasarca, dizziness, palpitations, syncope.   GI  No heartburn, indigestion, abdominal pain, nausea, vomiting, diarrhea, change in bowel habits, loss of appetite, bloody stools.   Resp:   No chest wall deformity  Skin: no rash or lesions.  GU: no dysuria, change in color of urine, no urgency or frequency.  No flank pain, no hematuria   MS:  No joint pain or swelling.  No decreased range of motion.  No back pain.  Psych:  No change in mood or affect. No depression or anxiety.  No memory loss.         Objective:   Physical Exam GEN: A/Ox3; pleasant , NAD, elderly   HEENT:  [place]/AT,  EACs-clear, TMs-wnl, NOSE-clear drainage, max tenderness  THROAT-clear, no lesions, no postnasal drip or exudate noted.   NECK:  Supple w/ fair ROM; no JVD; normal carotid impulses w/o bruits; no thyromegaly or nodules palpated; no lymphadenopathy.  RESP  Exp wheezing , no accessory muscle use, no dullness to percussion  CARD:  RRR, no m/r/g  , no peripheral edema,  pulses intact, no cyanosis or clubbing.   GI:   Soft & nt; nml bowel sounds; no organomegaly or masses detected.  Musco: Warm bil, no deformities or joint swelling noted.   Neuro: alert, no focal deficits noted.    Skin: Warm, no lesions or rashes Left lower leg w/ dressing /wound vac in place          Assessment & Plan:

## 2013-11-21 NOTE — Assessment & Plan Note (Signed)
Flare   Plan   Use mucinex DM twice daily for cough A depomedrol 120mg  injection was given Continue with saline nasal rinses.  Zpack take as directed.  Prednsione taper over next week.  follow up with Dr. Joya Gaskins  As planned and As needed   Please contact office for sooner follow up if symptoms do not improve or worsen or seek emergency care

## 2013-11-21 NOTE — Telephone Encounter (Signed)
Pt added on TP schedule at 1030 as acute visit Okay per Janett Billow  Nothing further needed.

## 2013-11-28 DIAGNOSIS — L97809 Non-pressure chronic ulcer of other part of unspecified lower leg with unspecified severity: Secondary | ICD-10-CM | POA: Diagnosis not present

## 2013-12-05 ENCOUNTER — Ambulatory Visit (INDEPENDENT_AMBULATORY_CARE_PROVIDER_SITE_OTHER): Payer: Medicare Other

## 2013-12-05 DIAGNOSIS — J45909 Unspecified asthma, uncomplicated: Secondary | ICD-10-CM | POA: Diagnosis not present

## 2013-12-05 DIAGNOSIS — J4550 Severe persistent asthma, uncomplicated: Secondary | ICD-10-CM

## 2013-12-05 DIAGNOSIS — L97809 Non-pressure chronic ulcer of other part of unspecified lower leg with unspecified severity: Secondary | ICD-10-CM | POA: Diagnosis not present

## 2013-12-06 MED ORDER — OMALIZUMAB 150 MG ~~LOC~~ SOLR
300.0000 mg | Freq: Once | SUBCUTANEOUS | Status: AC
  Administered 2013-12-06: 300 mg via SUBCUTANEOUS

## 2013-12-07 NOTE — Progress Notes (Signed)
[facility]  NAME:  [name] [name]                  ACCOUNT NO.:  MEDICAL RECORD NO.:  [id number]      DATE OF BIRTH:  August 08, 1932  PHYSICIAN:  Theodoro Kos, DO       VISIT DATE:  12/05/2013                                  OFFICE VISIT   The patient is a 80 year old female who is here for followup on her left leg chronic ulcer.  She has done extremely well.  The area is filled in, it almost flush with the surrounding area, it is red and pink, no sign of infection.  She has got a little bit of periwound redness.  There was no change in her medications or social history.  She has been using collagen over this past week.  She did take a shower once.  On exam, she is alert, oriented, cooperative, not in any distress.  She is very pleasant.  She is encouraged by the continued healing.  The periwound area is a little bit red, but no sign of infection.  It is filling in well.  There was no fibrous tissue or exudate.  We will continue with the dressings, but we will do Endoform and I gave her the extra piece to change midweek.  She should continue with elevation of multivitamin, Ace wrap, and we will see her back in 1 week.     Theodoro Kos, DO     CS/MEDQ  D:  12/05/2013  T:  12/05/2013  Job:  [id number]

## 2013-12-12 ENCOUNTER — Encounter (HOSPITAL_BASED_OUTPATIENT_CLINIC_OR_DEPARTMENT_OTHER): Payer: Medicare Other | Attending: Plastic Surgery

## 2013-12-12 DIAGNOSIS — L97809 Non-pressure chronic ulcer of other part of unspecified lower leg with unspecified severity: Secondary | ICD-10-CM | POA: Insufficient documentation

## 2013-12-12 DIAGNOSIS — I872 Venous insufficiency (chronic) (peripheral): Secondary | ICD-10-CM | POA: Diagnosis not present

## 2013-12-12 NOTE — Progress Notes (Signed)
[facility]  [name], [name]             ACCOUNT NO.:  [id number]  MEDICAL RECORD NO.:  [id number]      DATE OF BIRTH:  1932/06/29  PHYSICIAN:  Theodoro Kos, DO       VISIT DATE:  12/12/2013                                  OFFICE VISIT   HISTORY OF PRESENT ILLNESS:  The patient is a 80 year old female who is here for followup on her left lower extremity wound.  She is doing extremely well.  There is no change in her medications or social history.  She is now taking multivitamin.  REVIEW OF SYSTEMS:  Negative.  PHYSICAL EXAMINATION:  On exam, she is alert, oriented, cooperative, not in any distress.  Pupils are equal.  Extraocular muscles are intact.  No cervical lymphadenopathy.  Her breathing is unlabored.  She is very pleased with her progress.  The area has filled in very well.  She is now flushed with the rest of her periwound area.  PLAN:  Today we will put an Endoform on.  We will see if we can get preauthorization for another sheet of ACell.  We also discussed the possibility of doing a skin graft.  She is leaning towards awaiting on that for right now but we will see her back next week.  We will do a wrap today with Coban to see if that will help with some of the swelling but overall she is doing extremely well.     Theodoro Kos, DO     CS/MEDQ  D:  12/12/2013  T:  12/12/2013  Job:  [id number]

## 2013-12-16 ENCOUNTER — Telehealth: Payer: Self-pay | Admitting: Critical Care Medicine

## 2013-12-16 ENCOUNTER — Ambulatory Visit (INDEPENDENT_AMBULATORY_CARE_PROVIDER_SITE_OTHER): Payer: Medicare Other | Admitting: Critical Care Medicine

## 2013-12-16 ENCOUNTER — Encounter: Payer: Self-pay | Admitting: Critical Care Medicine

## 2013-12-16 ENCOUNTER — Ambulatory Visit (INDEPENDENT_AMBULATORY_CARE_PROVIDER_SITE_OTHER)
Admission: RE | Admit: 2013-12-16 | Discharge: 2013-12-16 | Disposition: A | Discharge status: Home / Self Care | Payer: Medicare Other | Source: Ambulatory Visit | Attending: Critical Care Medicine | Admitting: Critical Care Medicine

## 2013-12-16 VITALS — BP 138/70 | HR 80 | Temp 97.0°F | Ht 64.75 in | Wt 159.6 lb

## 2013-12-16 DIAGNOSIS — R059 Cough, unspecified: Secondary | ICD-10-CM

## 2013-12-16 DIAGNOSIS — R05 Cough: Secondary | ICD-10-CM

## 2013-12-16 DIAGNOSIS — R0602 Shortness of breath: Secondary | ICD-10-CM | POA: Diagnosis not present

## 2013-12-16 DIAGNOSIS — J45901 Unspecified asthma with (acute) exacerbation: Secondary | ICD-10-CM

## 2013-12-16 DIAGNOSIS — J4551 Severe persistent asthma with (acute) exacerbation: Secondary | ICD-10-CM

## 2013-12-16 MED ORDER — METHYLPREDNISOLONE ACETATE 80 MG/ML IJ SUSP
120.0000 mg | Freq: Once | INTRAMUSCULAR | Status: AC
  Administered 2013-12-16: 120 mg via INTRAMUSCULAR

## 2013-12-16 NOTE — Progress Notes (Signed)
Quick Note:  Pt aware of cxr results and recs per Dr. Joya Gaskins. See phone msg from 12/16/13. ______

## 2013-12-16 NOTE — Progress Notes (Signed)
Subjective:    Patient ID: [name], female    DOB: 10/12/1932, 80 y.o.   MRN: [id number]  HPI  80 y.o.  with moderate persistent asthma with severe atopy   12/16/2013 Chief Complaint  Patient presents with  . Follow-up    Dry coughing spells and PND x 1 wk with relief from Benzonatate.  Chest tightness and increased SOB today.    Notes more cough again and wheezing.  Seen 1 month ago and received depo/pred/zpak and helped.  Now one week of dry cough.  Notes pndrip as well.  Cough is dry, notes more dyspnea.  Pt denies any significant sore throat, nasal congestion or excess secretions, fever, chills, sweats, unintended weight loss, pleurtic or exertional chest pain, orthopnea PND, or leg swelling Pt denies any increase in rescue therapy over baseline, denies waking up needing it or having any early am or nocturnal exacerbations of coughing/wheezing/or dyspnea. Pt also denies any obvious fluctuation in symptoms with  weather or environmental change or other alleviating or aggravating factors     Review of Systems  Constitutional:   No  weight loss, night sweats,  Fevers, chills, fatigue, or  lassitude.  HEENT:   No headaches,  Difficulty swallowing,  Tooth/dental problems, or  Sore throat,                No sneezing, itching, ear ache, + nasal congestion, post nasal drip,   CV:  No chest pain,  Orthopnea, PND, swelling in lower extremities, anasarca, dizziness, palpitations, syncope.   GI  No heartburn, indigestion, abdominal pain, nausea, vomiting, diarrhea, change in bowel habits, loss of appetite, bloody stools.   Resp:   No chest wall deformity  Skin: no rash or lesions.  GU: no dysuria, change in color of urine, no urgency or frequency.  No flank pain, no hematuria   MS:  No joint pain or swelling.  No decreased range of motion.  No back pain.  Psych:  No change in mood or affect. No depression or anxiety.  No memory loss.         Objective:   Physical  Exam BP 138/70  Pulse 80  Temp(Src) 97 F (36.1 C) (Oral)  Ht 5' 4.75" (1.645 m)  Wt 159 lb 9.6 oz (72.394 kg)  BMI 26.75 kg/m2  SpO2 99%  GEN: A/Ox3; pleasant , NAD, elderly   HEENT:  [place]/AT,  EACs-clear, TMs-wnl, NOSE-clear drainage, max tenderness  THROAT-clear, no lesions, no postnasal drip or exudate noted.   NECK:  Supple w/ fair ROM; no JVD; normal carotid impulses w/o bruits; no thyromegaly or nodules palpated; no lymphadenopathy.  RESP  Exp wheezing , no accessory muscle use, no dullness to percussion  CARD:  RRR, no m/r/g  , no peripheral edema, pulses intact, no cyanosis or clubbing.   GI:   Soft & nt; nml bowel sounds; no organomegaly or masses detected.  Musco: Warm bil, no deformities or joint swelling noted.   Neuro: alert, no focal deficits noted.    Skin: Warm, no lesions or rashes Left lower leg w/ dressing /wound vac in place    Dg Chest 2 View  12/16/2013   CLINICAL DATA:  Dry cough, shortness of breath, mid sternal chest pain, hypertension.  EXAM: CHEST  2 VIEW  COMPARISON:  08/27/2009, 07/06/2009  FINDINGS: Mild linear left lung base opacity, favor atelectasis or scarring. Lungs are otherwise clear. Cardiomediastinal contours are within normal range. No pleural effusion or pneumothorax. No acute osseous  finding. Surgical clips right upper quadrant.  IMPRESSION: Mild left lung base opacity, favor atelectasis or scarring.   Electronically Signed   By: Carlos Levering M.D.   On: 12/16/2013 12:03         Assessment & Plan:   Severe persistent asthma Severe persistent asthma with flare Plan A Depo-Medrol injection was administered Check chest x-ray Maintain inhaled medications as prescribed   Updated Medication List Outpatient Encounter Prescriptions as of 12/16/2013  Medication Sig  . albuterol (PROAIR HFA) 108 (90 BASE) MCG/ACT inhaler Inhale 2 puffs into the lungs every 6 (six) hours as needed for wheezing or shortness of breath.  [name] albuterol  (PROVENTIL) (2.5 MG/3ML) 0.083% nebulizer solution Take 3 mLs (2.5 mg total) by nebulization every 6 (six) hours as needed for wheezing or shortness of breath.  . ALPRAZolam (XANAX) 0.5 MG tablet Take 0.5 mg by mouth 2 (two) times daily as needed for anxiety.   [name] azelastine (ASTELIN) 137 MCG/SPRAY nasal spray Place 2 sprays into the nose 2 (two) times daily. Use in each nostril as directed  . benzonatate (TESSALON) 100 MG capsule Take 100 mg by mouth 3 (three) times daily as needed for cough.  . Calcium Carbonate-Vitamin D (CALTRATE 600+D) 600-400 MG-UNIT per tablet Take 1 tablet by mouth daily.   . chlorpheniramine (CHLOR-TRIMETON) 4 MG tablet Take by mouth. 4 mg in the morning and 8 mg at bedtime  . Cholecalciferol (VITAMIN D3) 1000 UNITS tablet Take 1,000 Units by mouth daily.    [name] dextromethorphan (DELSYM) 30 MG/5ML liquid Take 60 mg by mouth 2 (two) times daily as needed for cough.  . diclofenac sodium (VOLTAREN) 1 % GEL Apply topically as needed.  [name] EPINEPHrine (EPI-PEN) 0.3 mg/0.3 mL SOAJ injection Inject 0.3 mLs (0.3 mg total) into the muscle once.  [name] esomeprazole (NEXIUM) 40 MG capsule TAKE 1 CAPSULE TWICE A DAY  . fexofenadine (ALLEGRA) 180 MG tablet Take 180 mg by mouth daily.    . furosemide (LASIX) 40 MG tablet Take 0.5 tablets (20 mg total) by mouth every morning.  . ibandronate (BONIVA) 150 MG tablet Take 1 tablet by mouth every 30 (thirty) days.  . mometasone (NASONEX) 50 MCG/ACT nasal spray Place 2 sprays into the nose 2 (two) times daily.  . montelukast (SINGULAIR) 10 MG tablet Take 1 tablet (10 mg total) by mouth at bedtime.  . Multiple Vitamin (MULTIVITAMIN) tablet Take 1 tablet by mouth daily.  [name] omalizumab (XOLAIR) 150 MG injection Inject 150 mg into the skin every 14 (fourteen) days.   . potassium chloride (K-DUR,KLOR-CON) 10 MEQ tablet Take 1 tablet (10 mEq total) by mouth daily.  . pramipexole (MIRAPEX) 0.125 MG tablet Take 0.375 mg by mouth at bedtime.   . rosuvastatin  (CRESTOR) 5 MG tablet Take 1 tablet (5 mg total) by mouth at bedtime.  [name] Spacer/Aero-Holding Chambers (AEROCHAMBER MV) inhaler by Other route. Use as instructed   . SYMBICORT 160-4.5 MCG/ACT inhaler INHALE 2 PUFFS INTO THE LUNGS TWICE A DAY  . telmisartan (MICARDIS) 40 MG tablet Take 0.5 tablets (20 mg total) by mouth at bedtime.  . traMADol (ULTRAM) 50 MG tablet Take 1 tablet by mouth as needed.   . triamcinolone cream (KENALOG) 0.5 % Apply 1 application topically 3 (three) times daily.  [name] trolamine salicylate (ASPERCREME) 10 % cream Apply 1 application topically 2 (two) times daily as needed (pain).   . vitamin B-12 (CYANOCOBALAMIN) 500 MCG tablet Take 500 mcg by mouth daily.    . [  DISCONTINUED] doxycycline (VIBRAMYCIN) 50 MG capsule Take 2 capsules (100 mg total) by mouth 2 (two) times daily.  . [DISCONTINUED] HYDROcodone-acetaminophen (NORCO) 5-325 MG per tablet Take 1 tablet by mouth every 6 (six) hours as needed for moderate pain.  . [DISCONTINUED] predniSONE (DELTASONE) 10 MG tablet 4 tabs for 2 days, then 3 tabs for 2 days, 2 tabs for 2 days, then 1 tab for 2 days, then stop  . [DISCONTINUED] SANTYL ointment every other day. To left leg as directed  . [EXPIRED] methylPREDNISolone acetate (DEPO-MEDROL) injection 120 mg

## 2013-12-16 NOTE — Progress Notes (Signed)
Quick Note:  lmomtcb for pt on home and cell #s ______ 

## 2013-12-16 NOTE — Assessment & Plan Note (Signed)
Severe persistent asthma with flare Plan A Depo-Medrol injection was administered Check chest x-ray Maintain inhaled medications as prescribed

## 2013-12-16 NOTE — Patient Instructions (Signed)
A depomedrol injection was given 120mg  A chest xray will be obtained No other medication changes

## 2013-12-16 NOTE — Telephone Encounter (Signed)
Called pt to inform her of below cxr results and recs:  Notes Recorded by Elsie Stain, MD on 12/16/2013 at 12:20 PM Tell pt NO pneumonia no antibiotics needed  -------  Called, spoke with pt.  Informed her of cxr results and recs as stated above per Dr. Joya Gaskins. She verbalized understanding and voiced no further questions or concerns at this time.

## 2013-12-19 ENCOUNTER — Other Ambulatory Visit: Payer: Self-pay | Admitting: Critical Care Medicine

## 2013-12-19 ENCOUNTER — Ambulatory Visit (INDEPENDENT_AMBULATORY_CARE_PROVIDER_SITE_OTHER): Payer: Medicare Other

## 2013-12-19 DIAGNOSIS — L97809 Non-pressure chronic ulcer of other part of unspecified lower leg with unspecified severity: Secondary | ICD-10-CM | POA: Diagnosis not present

## 2013-12-19 DIAGNOSIS — I872 Venous insufficiency (chronic) (peripheral): Secondary | ICD-10-CM | POA: Diagnosis not present

## 2013-12-19 DIAGNOSIS — J4550 Severe persistent asthma, uncomplicated: Secondary | ICD-10-CM

## 2013-12-19 DIAGNOSIS — J45909 Unspecified asthma, uncomplicated: Secondary | ICD-10-CM

## 2013-12-19 NOTE — Progress Notes (Signed)
[facility]  NAMEDANAYE, SOBH NO.:  [id number]  MEDICAL RECORD NO.:  [id number]      DATE OF BIRTH:  1933/01/26  PHYSICIAN:  Theodoro Kos, DO            VISIT DATE:                                  OFFICE VISIT   The patient is a 80 year old female who is here for followup on her left leg ulcer chronic that she has had for over year.  We debrided this and placed ACell, she has done extremely well and filled the area and it is now flushed with the surrounding skin, she is starting to epithelialize. There is no sign of infection.  Debridement was done today with a curette and overall, she is showing marked improvement.  There was no change in her medications or social history.  She is alert and oriented, cooperative, not in any acute distress.  She is very pleasant and very encouraged.  Pulses are present.  ACell was placed today and those notes are noted in the chart.  The patient was instructed on daily application of KY jelly and we will see her back in 1 week.     Theodoro Kos, DO     CS/MEDQ  D:  12/19/2013  T:  12/19/2013  Job:  [id number]

## 2013-12-20 MED ORDER — OMALIZUMAB 150 MG ~~LOC~~ SOLR
300.0000 mg | Freq: Once | SUBCUTANEOUS | Status: AC
  Administered 2013-12-20: 300 mg via SUBCUTANEOUS

## 2013-12-26 DIAGNOSIS — L97809 Non-pressure chronic ulcer of other part of unspecified lower leg with unspecified severity: Secondary | ICD-10-CM | POA: Diagnosis not present

## 2013-12-26 DIAGNOSIS — I872 Venous insufficiency (chronic) (peripheral): Secondary | ICD-10-CM | POA: Diagnosis not present

## 2013-12-26 NOTE — Progress Notes (Signed)
[facility]  NAMELODEMA, PARMA             ACCOUNT NO.:  [id number]  MEDICAL RECORD NO.:  [id number]      DATE OF BIRTH:  15-Apr-1933  PHYSICIAN:  Theodoro Kos, DO       VISIT DATE:  12/26/2013                                  OFFICE VISIT   HISTORY:  The patient is a 80 year old female who is here for followup on her left lower extremity chronic ulcer where she had a cyst excised and that did not heal.  There is chronic venous insufficiency.  There is no change in her medications.  No change in social history.  REVIEW OF SYSTEMS:  Negative.  PHYSICAL EXAMINATION:  GENERAL:  On exam, she is alert, oriented, cooperative, very pleasant as usual, very encouraged with her progress. RESPIRATORY:  Her breathing is unlabored. CARDIOVASCULAR:  Her heart rate is regular.  The pulses are strong and regular. EXTREMITIES:  She does have varicose veins due to the chronic venous insufficiency, but the wound is markedly improved and showing signs of epithelializing.  ASSESSMENT AND PLAN:  Today, we will do an endoform rewrapped with Kerlix and an Ace wrap.  Continue elevation, multivitamins, proteins; and we will see her back in 1 week.     Theodoro Kos, DO     CS/MEDQ  D:  12/26/2013  T:  12/26/2013  Job:  [id number]

## 2014-01-02 ENCOUNTER — Ambulatory Visit (INDEPENDENT_AMBULATORY_CARE_PROVIDER_SITE_OTHER): Payer: Medicare Other

## 2014-01-02 DIAGNOSIS — J4550 Severe persistent asthma, uncomplicated: Secondary | ICD-10-CM

## 2014-01-02 DIAGNOSIS — J45909 Unspecified asthma, uncomplicated: Secondary | ICD-10-CM

## 2014-01-04 MED ORDER — OMALIZUMAB 150 MG ~~LOC~~ SOLR
300.0000 mg | Freq: Once | SUBCUTANEOUS | Status: AC
  Administered 2014-01-04: 300 mg via SUBCUTANEOUS

## 2014-01-09 DIAGNOSIS — L97809 Non-pressure chronic ulcer of other part of unspecified lower leg with unspecified severity: Secondary | ICD-10-CM | POA: Diagnosis not present

## 2014-01-09 DIAGNOSIS — I872 Venous insufficiency (chronic) (peripheral): Secondary | ICD-10-CM | POA: Diagnosis not present

## 2014-01-10 NOTE — Progress Notes (Signed)
[facility]  NAMEGENESE, [name]             ACCOUNT NO.:  [id number]  MEDICAL RECORD NO.:  [id number]      DATE OF BIRTH:  1932/09/28  PHYSICIAN:  Theodoro Kos, DO       VISIT DATE:  01/09/2014                                  OFFICE VISIT   The patient is a 80 year old female who is here for followup on her left lower extremity chronic venous insufficiency ulcer.  She is doing extremely well and showing very good signs of healing.  She is now starting to epithelialize over the area.  It is a little bit red today, but no sign of infection.  No malodor.  No drainage.  She is alert and oriented, and cooperative.  There has been no change in her medications or social history.  Her breathing is unlabored.  She has varicose veins in the lower extremity but pulses are present.  The wound has markedly improved with signs of epithelialization over the ulcer.  We will continue with local dressings but we will do collagen for the next 2 weeks and we will see her back in 2 weeks.     Theodoro Kos, DO     CS/MEDQ  D:  01/09/2014  T:  01/10/2014  Job:  [id number]

## 2014-01-17 ENCOUNTER — Ambulatory Visit (INDEPENDENT_AMBULATORY_CARE_PROVIDER_SITE_OTHER): Payer: Medicare Other

## 2014-01-17 DIAGNOSIS — J45909 Unspecified asthma, uncomplicated: Secondary | ICD-10-CM

## 2014-01-17 DIAGNOSIS — J4550 Severe persistent asthma, uncomplicated: Secondary | ICD-10-CM

## 2014-01-18 MED ORDER — OMALIZUMAB 150 MG ~~LOC~~ SOLR
300.0000 mg | Freq: Once | SUBCUTANEOUS | Status: AC
  Administered 2014-01-18: 300 mg via SUBCUTANEOUS

## 2014-01-23 ENCOUNTER — Encounter (HOSPITAL_BASED_OUTPATIENT_CLINIC_OR_DEPARTMENT_OTHER): Payer: Medicare Other | Attending: Plastic Surgery

## 2014-01-23 DIAGNOSIS — L97809 Non-pressure chronic ulcer of other part of unspecified lower leg with unspecified severity: Secondary | ICD-10-CM | POA: Insufficient documentation

## 2014-01-23 DIAGNOSIS — I872 Venous insufficiency (chronic) (peripheral): Secondary | ICD-10-CM | POA: Diagnosis not present

## 2014-01-24 NOTE — Progress Notes (Signed)
[facility]  NAME:  [name], [name]                  ACCOUNT NO.:  MEDICAL RECORD NO.:  [id number]      DATE OF BIRTH:  01-Mar-1933  PHYSICIAN:  Theodoro Kos, DO       VISIT DATE:  01/23/2014                                  OFFICE VISIT   SUBJECTIVE:  The patient is a 80 year old female, who is here for followup on her left lower extremity chronic venous insufficiency ulcer. She had undergone debridement, ACell placement, and most recently Endoform.  She is doing extremely well.  The area is nearly healed. There is no change in her medications or social history.  OBJECTIVE:  GENERAL:  She is alert, cooperative, very pleasant.  No sign of distress. She is very excited about her progress. HEENT:  Pupils are equal. EXTREMITIES:  She still has varicose veins.  Pulses present.  ASSESSMENT AND PLAN:  The wound has completely filled in and now is epithelializing.  No sign of infection.  Recommended collagen shower at least every other day.  Keep her foot elevated and be careful not to bump it and we will see her back in 1 week.     Theodoro Kos, DO     CS/MEDQ  D:  01/23/2014  T:  01/23/2014  Job:  [id number]

## 2014-01-30 DIAGNOSIS — I872 Venous insufficiency (chronic) (peripheral): Secondary | ICD-10-CM | POA: Diagnosis not present

## 2014-01-30 DIAGNOSIS — L97809 Non-pressure chronic ulcer of other part of unspecified lower leg with unspecified severity: Secondary | ICD-10-CM | POA: Diagnosis not present

## 2014-01-30 NOTE — Progress Notes (Signed)
[facility]  NAME:  [name], [name]                  ACCOUNT NO.:  MEDICAL RECORD NO.:  [id number]      DATE OF BIRTH:  1933/01/02  PHYSICIAN:  Theodoro Kos, DO       VISIT DATE:  01/30/2014                                  OFFICE VISIT   The patient is a 80 year old female who is here for followup on her left lower extremity chronic venous insufficiency ulcer originally from trauma.  She is doing extremely well and has completely healed the area. There is still little periwound redness as she will go through the healing process.  It will be very important that she keeps her legs elevated and wears compression.  We are going to put a dressing on top of it just for protection while she pulls up her stockings, elevation, multivitamin, vitamin C, zinc, protein is still going to be important over the next month, and we will see her back as needed.     Theodoro Kos, DO     CS/MEDQ  D:  01/30/2014  T:  01/30/2014  Job:  [id number]

## 2014-01-31 ENCOUNTER — Ambulatory Visit (INDEPENDENT_AMBULATORY_CARE_PROVIDER_SITE_OTHER): Payer: Medicare Other

## 2014-01-31 DIAGNOSIS — J45909 Unspecified asthma, uncomplicated: Secondary | ICD-10-CM

## 2014-01-31 DIAGNOSIS — J4550 Severe persistent asthma, uncomplicated: Secondary | ICD-10-CM

## 2014-02-03 MED ORDER — OMALIZUMAB 150 MG ~~LOC~~ SOLR
300.0000 mg | Freq: Once | SUBCUTANEOUS | Status: AC
  Administered 2014-02-03: 300 mg via SUBCUTANEOUS

## 2014-02-15 ENCOUNTER — Encounter: Payer: Self-pay | Admitting: Internal Medicine

## 2014-02-15 ENCOUNTER — Ambulatory Visit (INDEPENDENT_AMBULATORY_CARE_PROVIDER_SITE_OTHER): Payer: Medicare Other

## 2014-02-15 ENCOUNTER — Encounter: Payer: Self-pay | Admitting: Critical Care Medicine

## 2014-02-15 ENCOUNTER — Ambulatory Visit (INDEPENDENT_AMBULATORY_CARE_PROVIDER_SITE_OTHER): Payer: Medicare Other | Admitting: Critical Care Medicine

## 2014-02-15 ENCOUNTER — Encounter (HOSPITAL_BASED_OUTPATIENT_CLINIC_OR_DEPARTMENT_OTHER): Payer: Medicare Other | Attending: General Surgery

## 2014-02-15 VITALS — BP 118/68 | HR 83 | Temp 96.7°F | Ht 65.0 in | Wt 167.5 lb

## 2014-02-15 DIAGNOSIS — I87332 Chronic venous hypertension (idiopathic) with ulcer and inflammation of left lower extremity: Secondary | ICD-10-CM | POA: Insufficient documentation

## 2014-02-15 DIAGNOSIS — L97829 Non-pressure chronic ulcer of other part of left lower leg with unspecified severity: Secondary | ICD-10-CM | POA: Insufficient documentation

## 2014-02-15 DIAGNOSIS — J4550 Severe persistent asthma, uncomplicated: Secondary | ICD-10-CM | POA: Diagnosis not present

## 2014-02-15 NOTE — Progress Notes (Signed)
Subjective:    Patient ID: [name], female    DOB: 1932/12/11, 80 y.o.   MRN: [id number]  HPI 02/15/2014 Chief Complaint  Patient presents with  . 2 month follow up    Good days and bad days - still has dry coughing spells, PND, and some chest tightness.     Stays fatigued. Dry cough spells.  Notes chest tight, mild.  occ wheeze noted. Pt denies any significant sore throat, nasal congestion or excess secretions, fever, chills, sweats, unintended weight loss, pleurtic or exertional chest pain, orthopnea PND, or leg swelling Pt denies any increase in rescue therapy over baseline, denies waking up needing it or having any early am or nocturnal exacerbations of coughing/wheezing/or dyspnea. Pt also denies any obvious fluctuation in symptoms with  weather or environmental change or other alleviating or aggravating factors    PUL ASTHMA HISTORY 02/15/2014 01/12/2013 11/15/2012 09/29/2012 07/22/2012  Symptoms Daily Daily Daily 0-2 days/week Daily  Nighttime awakenings 0-2/month Often--7/wk 0-2/month 0-2/month 3-4/month  Interference with activity Some limitations Extreme limitations Some limitations Minor limitations Some limitations  SABA use > 2 days/wk--not > 1 x/day 0-2 days/wk > 2 days/wk--not > 1 x/day 0-2 days/wk Daily  Exacerbations requiring oral steroids 0-1 / year 0-1 / year 2 or more / year 0-1 / year 2 or more / year    Review of Systems Constitutional:   No  weight loss, night sweats,  Fevers, chills, +++fatigue,+++ lassitude. HEENT:   No headaches,  Difficulty swallowing,  Tooth/dental problems,  Sore throat,                No sneezing, itching, ear ache, nasal congestion, ++++post nasal drip,   CV:  No chest pain,  Orthopnea, PND, swelling in lower extremities, anasarca, dizziness, palpitations  GI  No heartburn, indigestion, abdominal pain, nausea, vomiting, diarrhea, change in bowel habits, loss of appetite  Resp: Notes shortness of breath with exertion not at rest.  No  excess mucus, no productive cough,  Notes non-productive cough,  No coughing up of blood.  No change in color of mucus.  No wheezing.  No chest wall deformity  Skin: no rash or lesions.  GU: no dysuria, change in color of urine, no urgency or frequency.  No flank pain.  MS:  No joint pain or swelling.  No decreased range of motion.  No back pain.  Psych:  No change in mood or affect. No depression or anxiety.  No memory loss.     Objective:   Physical Exam Filed Vitals:   02/15/14 0915  BP: 118/68  Pulse: 83  Temp: 96.7 F (35.9 C)  TempSrc: Oral  Height: 5\' 5"  (1.651 m)  Weight: 167 lb 8 oz (75.978 kg)  SpO2: 99%    Gen: Pleasant, well-nourished, in no distress,  normal affect  ENT: No lesions,  mouth clear,  oropharynx clear, no postnasal drip  Neck: No JVD, no TMG, no carotid bruits  Lungs: No use of accessory muscles, no dullness to percussion, distant BS  Cardiovascular: RRR, heart sounds normal, no murmur or gallops, no peripheral edema  Abdomen: soft and NT, no HSM,  BS normal  Musculoskeletal: No deformities, no cyanosis or clubbing  Neuro: alert, non focal  Skin: Warm, no lesions or rashes  No results found.     Assessment & Plan:   Severe persistent asthma Severe persistent asthma Plan Maintain inhaled medications as prescribed Maintain Xolair    Updated Medication List Outpatient Encounter Prescriptions as  of 02/15/2014  Medication Sig  . albuterol (PROAIR HFA) 108 (90 BASE) MCG/ACT inhaler Inhale 2 puffs into the lungs every 6 (six) hours as needed for wheezing or shortness of breath.  [name] albuterol (PROVENTIL) (2.5 MG/3ML) 0.083% nebulizer solution Take 3 mLs (2.5 mg total) by nebulization every 6 (six) hours as needed for wheezing or shortness of breath.  . ALPRAZolam (XANAX) 0.5 MG tablet Take 0.5 mg by mouth 2 (two) times daily as needed for anxiety.   [name] azelastine (ASTELIN) 0.1 % nasal spray USE 2 SPRAYS IN EACH NOSTRIL TWICE A DAY AS  DIRECTED  . benzonatate (TESSALON) 100 MG capsule Take 100 mg by mouth 3 (three) times daily as needed for cough.  . Calcium Carbonate-Vitamin D (CALTRATE 600+D) 600-400 MG-UNIT per tablet Take 1 tablet by mouth daily.   . chlorpheniramine (CHLOR-TRIMETON) 4 MG tablet Take by mouth. 4 mg in the morning and 8 mg at bedtime  . Cholecalciferol (VITAMIN D3) 1000 UNITS tablet Take 1,000 Units by mouth daily.    [name] dextromethorphan (DELSYM) 30 MG/5ML liquid Take 60 mg by mouth 2 (two) times daily as needed for cough.  . diclofenac sodium (VOLTAREN) 1 % GEL Apply topically as needed.  [name] EPINEPHrine (EPI-PEN) 0.3 mg/0.3 mL SOAJ injection Inject 0.3 mLs (0.3 mg total) into the muscle once.  [name] esomeprazole (NEXIUM) 40 MG capsule TAKE 1 CAPSULE TWICE A DAY  . fexofenadine (ALLEGRA) 180 MG tablet Take 180 mg by mouth daily.    . furosemide (LASIX) 40 MG tablet Take 0.5 tablets (20 mg total) by mouth every morning.  . ibandronate (BONIVA) 150 MG tablet Take 1 tablet by mouth every 30 (thirty) days.  . montelukast (SINGULAIR) 10 MG tablet Take 1 tablet (10 mg total) by mouth at bedtime.  . Multiple Vitamin (MULTIVITAMIN) tablet Take 1 tablet by mouth daily.  [name] NASONEX 50 MCG/ACT nasal spray USE 2 SPRAYS NASALLY TWICE A DAY  . omalizumab (XOLAIR) 150 MG injection Inject 150 mg into the skin every 14 (fourteen) days.   . potassium chloride (K-DUR,KLOR-CON) 10 MEQ tablet Take 1 tablet (10 mEq total) by mouth daily.  . pramipexole (MIRAPEX) 0.125 MG tablet Take 0.375 mg by mouth at bedtime.   . rosuvastatin (CRESTOR) 5 MG tablet Take 1 tablet (5 mg total) by mouth at bedtime.  [name] Spacer/Aero-Holding Chambers (AEROCHAMBER MV) inhaler by Other route. Use as instructed   . SYMBICORT 160-4.5 MCG/ACT inhaler INHALE 2 PUFFS INTO THE LUNGS TWICE A DAY  . telmisartan (MICARDIS) 40 MG tablet Take 0.5 tablets (20 mg total) by mouth at bedtime.  . traMADol (ULTRAM) 50 MG tablet Take 1 tablet by mouth as needed.   .  triamcinolone cream (KENALOG) 0.5 % Apply 1 application topically 3 (three) times daily as needed.  . trolamine salicylate (ASPERCREME) 10 % cream Apply 1 application topically 2 (two) times daily as needed (pain).   . vitamin B-12 (CYANOCOBALAMIN) 500 MCG tablet Take 500 mcg by mouth daily.    . [DISCONTINUED] mometasone (NASONEX) 50 MCG/ACT nasal spray Place 2 sprays into the nose 2 (two) times daily.  . [DISCONTINUED] triamcinolone cream (KENALOG) 0.5 % Apply 1 application topically 3 (three) times daily.

## 2014-02-15 NOTE — Patient Instructions (Signed)
No change in medications. Return in          3 months You declined the flu vaccine

## 2014-02-15 NOTE — Assessment & Plan Note (Signed)
Severe persistent asthma Plan Maintain inhaled medications as prescribed Maintain Xolair

## 2014-02-22 DIAGNOSIS — I87332 Chronic venous hypertension (idiopathic) with ulcer and inflammation of left lower extremity: Secondary | ICD-10-CM | POA: Diagnosis not present

## 2014-02-22 DIAGNOSIS — L97829 Non-pressure chronic ulcer of other part of left lower leg with unspecified severity: Secondary | ICD-10-CM | POA: Diagnosis not present

## 2014-02-23 ENCOUNTER — Other Ambulatory Visit (INDEPENDENT_AMBULATORY_CARE_PROVIDER_SITE_OTHER): Payer: Medicare Other | Admitting: *Deleted

## 2014-02-23 ENCOUNTER — Other Ambulatory Visit: Payer: Self-pay | Admitting: *Deleted

## 2014-02-23 DIAGNOSIS — E785 Hyperlipidemia, unspecified: Secondary | ICD-10-CM | POA: Diagnosis not present

## 2014-02-23 DIAGNOSIS — Z79899 Other long term (current) drug therapy: Secondary | ICD-10-CM

## 2014-02-23 LAB — BASIC METABOLIC PANEL
BUN: 27 mg/dL — ABNORMAL HIGH (ref 6–23)
CHLORIDE: 100 meq/L (ref 96–112)
CO2: 29 mEq/L (ref 19–32)
Calcium: 8.9 mg/dL (ref 8.4–10.5)
Creatinine, Ser: 1.4 mg/dL — ABNORMAL HIGH (ref 0.4–1.2)
GFR: 38.23 mL/min — ABNORMAL LOW (ref >60.00)
Glucose, Bld: 110 mg/dL — ABNORMAL HIGH (ref 70–99)
POTASSIUM: 3.9 meq/L (ref 3.5–5.1)
SODIUM: 137 meq/L (ref 135–145)

## 2014-02-23 LAB — HEPATIC FUNCTION PANEL
ALBUMIN: 3 g/dL — AB (ref 3.5–5.2)
ALT: 19 U/L (ref 0–35)
AST: 22 U/L (ref 0–37)
Alkaline Phosphatase: 80 U/L (ref 39–117)
Bilirubin, Direct: 0.1 mg/dL (ref 0.0–0.3)
TOTAL PROTEIN: 6.9 g/dL (ref 6.0–8.3)
Total Bilirubin: 0.7 mg/dL (ref 0.2–1.2)

## 2014-02-23 LAB — LIPID PANEL
Cholesterol: 165 mg/dL (ref 0–200)
HDL: 71.4 mg/dL (ref >39.00)
LDL CALC: 79 mg/dL (ref 0–99)
NonHDL: 93.6
TRIGLYCERIDES: 74 mg/dL (ref 0.0–149.0)
Total CHOL/HDL Ratio: 2
VLDL: 14.8 mg/dL (ref 0.0–40.0)

## 2014-02-24 DIAGNOSIS — S8010XD Contusion of unspecified lower leg, subsequent encounter: Secondary | ICD-10-CM | POA: Diagnosis not present

## 2014-02-24 DIAGNOSIS — I10 Essential (primary) hypertension: Secondary | ICD-10-CM | POA: Diagnosis not present

## 2014-02-24 DIAGNOSIS — Z6828 Body mass index (BMI) 28.0-28.9, adult: Secondary | ICD-10-CM | POA: Diagnosis not present

## 2014-02-24 DIAGNOSIS — N3281 Overactive bladder: Secondary | ICD-10-CM | POA: Diagnosis not present

## 2014-02-24 DIAGNOSIS — R8299 Other abnormal findings in urine: Secondary | ICD-10-CM | POA: Diagnosis not present

## 2014-02-24 DIAGNOSIS — J45909 Unspecified asthma, uncomplicated: Secondary | ICD-10-CM | POA: Diagnosis not present

## 2014-02-24 DIAGNOSIS — Z1389 Encounter for screening for other disorder: Secondary | ICD-10-CM | POA: Diagnosis not present

## 2014-02-24 DIAGNOSIS — R7301 Impaired fasting glucose: Secondary | ICD-10-CM | POA: Diagnosis not present

## 2014-02-24 MED ORDER — OMALIZUMAB 150 MG ~~LOC~~ SOLR
300.0000 mg | Freq: Once | SUBCUTANEOUS | Status: AC
Start: 2014-02-24 — End: 2014-02-24
  Administered 2014-02-24: 300 mg via SUBCUTANEOUS

## 2014-03-01 ENCOUNTER — Ambulatory Visit (INDEPENDENT_AMBULATORY_CARE_PROVIDER_SITE_OTHER): Payer: Medicare Other

## 2014-03-01 DIAGNOSIS — L97829 Non-pressure chronic ulcer of other part of left lower leg with unspecified severity: Secondary | ICD-10-CM | POA: Diagnosis not present

## 2014-03-01 DIAGNOSIS — J4550 Severe persistent asthma, uncomplicated: Secondary | ICD-10-CM

## 2014-03-01 DIAGNOSIS — I87332 Chronic venous hypertension (idiopathic) with ulcer and inflammation of left lower extremity: Secondary | ICD-10-CM | POA: Diagnosis not present

## 2014-03-01 MED ORDER — OMALIZUMAB 150 MG ~~LOC~~ SOLR
300.0000 mg | Freq: Once | SUBCUTANEOUS | Status: AC
  Administered 2014-03-01: 300 mg via SUBCUTANEOUS

## 2014-03-06 ENCOUNTER — Other Ambulatory Visit: Payer: Medicare Other

## 2014-03-07 ENCOUNTER — Encounter: Payer: Self-pay | Admitting: Cardiovascular Disease

## 2014-03-07 ENCOUNTER — Ambulatory Visit (INDEPENDENT_AMBULATORY_CARE_PROVIDER_SITE_OTHER): Payer: Medicare Other | Admitting: Cardiovascular Disease

## 2014-03-07 VITALS — BP 142/80 | HR 84 | Ht 65.0 in | Wt 164.8 lb

## 2014-03-07 DIAGNOSIS — E785 Hyperlipidemia, unspecified: Secondary | ICD-10-CM

## 2014-03-07 DIAGNOSIS — R0789 Other chest pain: Secondary | ICD-10-CM | POA: Diagnosis not present

## 2014-03-07 DIAGNOSIS — I10 Essential (primary) hypertension: Secondary | ICD-10-CM

## 2014-03-07 NOTE — Assessment & Plan Note (Signed)
She has noncardiac CP. Normal coronaries by cath.

## 2014-03-07 NOTE — Assessment & Plan Note (Signed)
Her Bp has been well controlled. Continue to watch her salt. She is not able to exercise because of her asthma.

## 2014-03-07 NOTE — Assessment & Plan Note (Signed)
Lipids look great. Continue meds

## 2014-03-07 NOTE — Progress Notes (Signed)
[name] Date of Birth  01/09/1933       [facility]    [employer] [address], [place], [place]  [address]   [place], [place]  [address] [phone]     [phone]   Fax  [phone]    Fax [phone]  Problem List: 1. History of chest pain-normal coronary arteries by heart catheterization in August, 2008 2. History of breast cancer-status post right mastectomy and 1989-status post left mastectomy in 1990 3. Hypertension 4. Hyperlipidemia 5. Asthma  History of Present Illness:  [name] has had lots of problems with her asthma this summer.  She's not had any cardiac complaints. She denies any chest pain. She's not been able to exercise much of the summer because her asthma problems.  Oct. 23, 2014:  She has had a total knee replacement since I saw her.   She is back exercising some - rehab for her knee.  Has lost a few lbs.   Oct. 27, 2015:  [name] is doing ok from a cardiac standpoint. She's had a wound on the left lower leg which she's been tending to.  She has been going to the wound Center.   [name] history of asthma and has chronic shortness of breath. No angina like pain.    Current Outpatient Prescriptions on File Prior to Visit  Medication Sig Dispense Refill  . albuterol (PROAIR HFA) 108 (90 BASE) MCG/ACT inhaler Inhale 2 puffs into the lungs every 6 (six) hours as needed for wheezing or shortness of breath.      [name] albuterol (PROVENTIL) (2.5 MG/3ML) 0.083% nebulizer solution Take 3 mLs (2.5 mg total) by nebulization every 6 (six) hours as needed for wheezing or shortness of breath.  75 mL  12  . ALPRAZolam (XANAX) 0.5 MG tablet Take 0.5 mg by mouth 2 (two) times daily as needed for anxiety.       [name] azelastine (ASTELIN) 0.1 % nasal spray USE 2 SPRAYS IN EACH NOSTRIL TWICE A DAY AS DIRECTED  3 mL  2  . benzonatate (TESSALON) 100 MG capsule Take 100 mg by mouth 3 (three) times daily as needed for cough.       . Calcium Carbonate-Vitamin D (CALTRATE 600+D) 600-400 MG-UNIT per tablet Take 1 tablet by mouth daily.       . chlorpheniramine (CHLOR-TRIMETON) 4 MG tablet Take by mouth. 4 mg in the morning and 8 mg at bedtime      . Cholecalciferol (VITAMIN D3) 1000 UNITS tablet Take 1,000 Units by mouth daily.        [name] dextromethorphan (DELSYM) 30 MG/5ML liquid Take 60 mg by mouth 2 (two) times daily as needed for cough.      . EPINEPHrine (EPI-PEN) 0.3 mg/0.3 mL SOAJ injection Inject 0.3 mLs (0.3 mg total) into the muscle once.  2 Device  1  . esomeprazole (NEXIUM) 40 MG capsule TAKE 1 CAPSULE TWICE A DAY  180 capsule  2  . furosemide (LASIX) 40 MG tablet Take 0.5 tablets (20 mg total) by mouth every morning.  45 tablet  3  . ibandronate (BONIVA) 150 MG tablet Take 1 tablet by mouth every 30 (thirty) days.      . montelukast (SINGULAIR) 10 MG tablet Take 1 tablet (10 mg total) by mouth at bedtime.  90 tablet  4  . Multiple Vitamin (MULTIVITAMIN) tablet Take 1 tablet by mouth daily.      [name] NASONEX 50  MCG/ACT nasal spray USE 2 SPRAYS NASALLY TWICE A DAY  51 g  1  . omalizumab (XOLAIR) 150 MG injection Inject 150 mg into the skin every 14 (fourteen) days.       . potassium chloride (K-DUR,KLOR-CON) 10 MEQ tablet Take 1 tablet (10 mEq total) by mouth daily.  90 tablet  3  . pramipexole (MIRAPEX) 0.125 MG tablet Take 0.375 mg by mouth at bedtime.       . rosuvastatin (CRESTOR) 5 MG tablet Take 1 tablet (5 mg total) by mouth at bedtime.  90 tablet  3  . Spacer/Aero-Holding Chambers (AEROCHAMBER MV) inhaler by Other route. Use as instructed       . SYMBICORT 160-4.5 MCG/ACT inhaler INHALE 2 PUFFS INTO THE LUNGS TWICE A DAY  3 Inhaler  3  . telmisartan (MICARDIS) 40 MG tablet Take 0.5 tablets (20 mg total) by mouth at bedtime.  45 tablet  6  . traMADol (ULTRAM) 50 MG tablet Take 1 tablet by mouth as needed.       . vitamin B-12 (CYANOCOBALAMIN) 500 MCG tablet Take 500 mcg by mouth daily.        . diclofenac  sodium (VOLTAREN) 1 % GEL Apply topically as needed.      . fexofenadine (ALLEGRA) 180 MG tablet Take 180 mg by mouth daily.        [name] triamcinolone cream (KENALOG) 0.5 % Apply 1 application topically 3 (three) times daily as needed.      . trolamine salicylate (ASPERCREME) 10 % cream Apply 1 application topically 2 (two) times daily as needed (pain).        No current facility-administered medications on file prior to visit.    Allergies  Allergen Reactions  . Eggs Or Egg-Derived Products   . Influenza Vaccines   . Cephalosporins Rash  . Codeine Rash  . Levofloxacin Rash  . Penicillins Rash  . Pneumococcal Vaccines Rash    Past Medical History  Diagnosis Date  . Restless legs syndrome (RLS)   . Osteoporosis, unspecified     Knee and hip osteoarthritis bilaterally  . Unspecified essential hypertension   . Hyperlipidemia   . Perennial allergic rhinitis   . Diverticulosis   . Chronic kidney disease, stage II (mild)   . History of DVT of lower extremity   . History of shingles   . Asthma, severe persistent     pulmologist-- [name]  . GERD (gastroesophageal reflux disease)   . DDD (degenerative disc disease), cervical   . History of breast cancer     1989  S/P   RIGHT MASTECTOMY ;  NO CHEMORADIATION //   NO RECURRENCE  . Leg ulcer, left   . Edema, lower extremity     Past Surgical History  Procedure Laterality Date  . Total abdominal hysterectomy w/ bilateral salpingoophorectomy  1989    done at same time as right mastectomy  . Total knee arthroplasty Right 10/05/2012    Procedure: RIGHT TOTAL KNEE ARTHROPLASTY;  Surgeon: [name] Pole, MD;  Location: [facility];  Service: Orthopedics;  Laterality: Right;  . Cardiac catheterization  08/ 01/ 2008   [name] Cathie Olden    normal -- EF of 65%  . Cataract extraction w/ intraocular lens  implant, bilateral    . Cholecystectomy  1993  . Lumbar disc surgery  [name]  . Mastectomy Bilateral rigth 1989///   left  1990    breast  cancer 1989//   fibrocytic disease  1990  . Cardiovascular stress test  05-22-2011   [name] Cathie Olden    normal lexiscan perfusion study/  no ischemia/  lvsf  86%  . Incision and drainage of wound Left 10/24/2013    Procedure: IRRIGATION AND DEBRIDEMENT OF LEFT LEG WITH PLACEMENT OF ACELL AND WOUND VAC;  Surgeon: Theodoro Kos, DO;  Location: [facility];  Service: Plastics;  Laterality: Left;    History  Smoking status  . Never Smoker   Smokeless tobacco  . Never Used    History  Alcohol Use No    Family History  Problem Relation Age of Onset  . Heart attack Mother   . Asthma Mother   . Heart disease Mother   . Hyperlipidemia Mother   . Heart attack Father   . Heart failure Father   . Deep vein thrombosis Father   . Heart disease Father   . Peripheral vascular disease Father     amputation  . Cancer Brother   . Asthma Brother   . Coronary artery disease Sister   . Heart disease Sister     Reviw of Systems:  Reviewed in the HPI.  All other systems are negative.  Physical Exam: Blood pressure 142/80, pulse 84, height 5\' 5"  (1.651 m), weight 164 lb 12.8 oz (74.753 kg). General: Well developed, well nourished, in no acute distress.  Head: Normocephalic, atraumatic, sclera non-icteric, mucus membranes are moist,  Neck: Supple. Carotids are 2 + without bruits. No JVD Lungs: Clear bilaterally to auscultation. Heart: regular rate.  normal  S1 S2. No murmurs, gallops or rubs. Abdomen: Soft, non-tender, non-distended with normal bowel sounds. No hepatomegaly. No rebound/guarding. No masses. Msk:  Strength and tone are normal Extremities: No clubbing or cyanosis. No edema.  Distal pedal pulses are 2+ and equal bilaterally. Neuro: Alert and oriented X 3. Moves all extremities spontaneously. Psych:  Responds to questions appropriately with a normal affect.  ECG: Oct. 27, 2015: NSR at 75.     No significant changes from previous tracings.  Assessment / Plan:  :

## 2014-03-07 NOTE — Assessment & Plan Note (Signed)
>>  ASSESSMENT AND PLAN FOR HYPERTENSIVE HEART DISEASE WITHOUT CHF WRITTEN ON 03/07/2014 10:41 AM BY NAHSER, PHILIP J, MD  Her Bp has been well controlled. Continue to watch her salt. She is not able to exercise because of her asthma.

## 2014-03-07 NOTE — Patient Instructions (Signed)
The current medical regimen is effective;  continue present plan and medications.  Follow up in 1 year with Dr. Acie Fredrickson.  You will receive a letter in the mail 2 months before you are due.  Please call us when you receive this letter to schedule your follow up appointment.

## 2014-03-08 DIAGNOSIS — L97829 Non-pressure chronic ulcer of other part of left lower leg with unspecified severity: Secondary | ICD-10-CM | POA: Diagnosis not present

## 2014-03-08 DIAGNOSIS — I87332 Chronic venous hypertension (idiopathic) with ulcer and inflammation of left lower extremity: Secondary | ICD-10-CM | POA: Diagnosis not present

## 2014-03-15 ENCOUNTER — Ambulatory Visit (INDEPENDENT_AMBULATORY_CARE_PROVIDER_SITE_OTHER): Payer: Medicare Other

## 2014-03-15 DIAGNOSIS — J4550 Severe persistent asthma, uncomplicated: Secondary | ICD-10-CM

## 2014-03-19 ENCOUNTER — Other Ambulatory Visit: Payer: Self-pay | Admitting: Cardiovascular Disease

## 2014-03-20 DIAGNOSIS — J45909 Unspecified asthma, uncomplicated: Secondary | ICD-10-CM | POA: Diagnosis not present

## 2014-03-20 DIAGNOSIS — Z6828 Body mass index (BMI) 28.0-28.9, adult: Secondary | ICD-10-CM | POA: Diagnosis not present

## 2014-03-20 DIAGNOSIS — S8010XD Contusion of unspecified lower leg, subsequent encounter: Secondary | ICD-10-CM | POA: Diagnosis not present

## 2014-03-20 DIAGNOSIS — R609 Edema, unspecified: Secondary | ICD-10-CM | POA: Diagnosis not present

## 2014-03-20 DIAGNOSIS — M7088 Other soft tissue disorders related to use, overuse and pressure other site: Secondary | ICD-10-CM | POA: Diagnosis not present

## 2014-03-20 DIAGNOSIS — I82409 Acute embolism and thrombosis of unspecified deep veins of unspecified lower extremity: Secondary | ICD-10-CM | POA: Diagnosis not present

## 2014-03-22 DIAGNOSIS — M7088 Other soft tissue disorders related to use, overuse and pressure other site: Secondary | ICD-10-CM | POA: Diagnosis not present

## 2014-03-23 MED ORDER — OMALIZUMAB 150 MG ~~LOC~~ SOLR
300.0000 mg | Freq: Once | SUBCUTANEOUS | Status: AC
  Administered 2014-03-23: 300 mg via SUBCUTANEOUS

## 2014-03-27 DIAGNOSIS — I82409 Acute embolism and thrombosis of unspecified deep veins of unspecified lower extremity: Secondary | ICD-10-CM | POA: Diagnosis not present

## 2014-03-27 DIAGNOSIS — Z6827 Body mass index (BMI) 27.0-27.9, adult: Secondary | ICD-10-CM | POA: Diagnosis not present

## 2014-03-27 DIAGNOSIS — J309 Allergic rhinitis, unspecified: Secondary | ICD-10-CM | POA: Diagnosis not present

## 2014-03-27 DIAGNOSIS — J45909 Unspecified asthma, uncomplicated: Secondary | ICD-10-CM | POA: Diagnosis not present

## 2014-03-27 DIAGNOSIS — R05 Cough: Secondary | ICD-10-CM | POA: Diagnosis not present

## 2014-03-27 DIAGNOSIS — R609 Edema, unspecified: Secondary | ICD-10-CM | POA: Diagnosis not present

## 2014-03-28 ENCOUNTER — Telehealth: Payer: Self-pay | Admitting: Critical Care Medicine

## 2014-03-28 DIAGNOSIS — N182 Chronic kidney disease, stage 2 (mild): Secondary | ICD-10-CM

## 2014-03-28 DIAGNOSIS — R0600 Dyspnea, unspecified: Secondary | ICD-10-CM

## 2014-03-28 DIAGNOSIS — I2699 Other pulmonary embolism without acute cor pulmonale: Secondary | ICD-10-CM

## 2014-03-28 NOTE — Telephone Encounter (Signed)
I would wait TWO weeks until xarelto takes more effect I would obtain a CT Angio of chest with contrast tomorrow to evaluate for pulmonary embolism Needs to see one of our providers this week after CT Angio,  I am out till next week

## 2014-03-28 NOTE — Telephone Encounter (Signed)
Pt aware that [facility] will be contacting her tomorrow to schedule a CT appt. This will be done tomorrow.  Pt also aware that Xolair needs to wait TWO weeks per PW to be done.  Xolair appt cancelled. Pt states that she will reschedule when she comes in on Friday. Patient scheduled for OV with TP 03/31/14 at 3pm to discuss CT Angio results.  Pt will need BMET before scan can be scheduled. Pt aware that labs need to be drawn. Pt states that she will come get labs between 8-830 tomorrow AM and asked if she could come up and check on status of CT afterward, advised pt that this is okay to do. Will send to [facility] to follow up on in AM. Suanne Marker aware)

## 2014-03-28 NOTE — Telephone Encounter (Signed)
Pt states that she has been diagnoses recently with blood clots in Left leg--pt currently taking Xarelto 15mg  BID Pt concerned that she may have a lot in her lungs d/t increased SOB x 1 week. Pt states that has noticed a difference in her SOB since being on Xarelto. Pt states that her PCP advised her to rest and limit activity. Pt states that PCP thought that she may have had a blood clot in her lungs d/t the SOB she was experiencing.  Pt wants to know if it is safe for her to get her Xolair injection tomorrow? Please advise Dr Joya Gaskins.

## 2014-03-29 ENCOUNTER — Other Ambulatory Visit (INDEPENDENT_AMBULATORY_CARE_PROVIDER_SITE_OTHER): Payer: Medicare Other

## 2014-03-29 ENCOUNTER — Ambulatory Visit (INDEPENDENT_AMBULATORY_CARE_PROVIDER_SITE_OTHER)
Admission: RE | Admit: 2014-03-29 | Discharge: 2014-03-29 | Disposition: A | Discharge status: Home / Self Care | Payer: Medicare Other | Source: Ambulatory Visit | Attending: Critical Care Medicine | Admitting: Critical Care Medicine

## 2014-03-29 ENCOUNTER — Other Ambulatory Visit: Payer: Self-pay | Admitting: Critical Care Medicine

## 2014-03-29 DIAGNOSIS — I2699 Other pulmonary embolism without acute cor pulmonale: Secondary | ICD-10-CM | POA: Diagnosis not present

## 2014-03-29 DIAGNOSIS — N182 Chronic kidney disease, stage 2 (mild): Secondary | ICD-10-CM | POA: Diagnosis not present

## 2014-03-29 DIAGNOSIS — R0600 Dyspnea, unspecified: Secondary | ICD-10-CM

## 2014-03-29 DIAGNOSIS — R911 Solitary pulmonary nodule: Secondary | ICD-10-CM | POA: Diagnosis not present

## 2014-03-29 LAB — BASIC METABOLIC PANEL
BUN: 36 mg/dL — ABNORMAL HIGH (ref 6–23)
CHLORIDE: 103 meq/L (ref 96–112)
CO2: 30 mEq/L (ref 19–32)
Calcium: 9 mg/dL (ref 8.4–10.5)
Creatinine, Ser: 1.5 mg/dL — ABNORMAL HIGH (ref 0.4–1.2)
GFR: 37.01 mL/min — ABNORMAL LOW (ref >60.00)
Glucose, Bld: 131 mg/dL — ABNORMAL HIGH (ref 70–99)
POTASSIUM: 4.2 meq/L (ref 3.5–5.1)
Sodium: 139 mEq/L (ref 135–145)

## 2014-03-29 MED ORDER — IOHEXOL 350 MG/ML SOLN
65.0000 mL | Freq: Once | INTRAVENOUS | Status: AC | PRN
  Administered 2014-03-29: 65 mL via INTRAVENOUS

## 2014-03-29 NOTE — Progress Notes (Signed)
Quick Note:  Friday, Nov 20 appt with TP cancelled. Spoke with pt. She is aware and voiced no further questions or concerns at this time. ______

## 2014-03-29 NOTE — Telephone Encounter (Signed)
[facility] Chest appointment has been scheduled for Wed. 03/29/14 at 10:15. Pt contacted by Lattie Haw at [place] and arranged for pt to come over to [place] on [facility] after her labs this morning.  Order given to [place] [facility] to check secondary insurance for pre cert. Pt is aware of appointment. Rhonda J Cobb

## 2014-03-30 ENCOUNTER — Ambulatory Visit: Payer: Medicare Other

## 2014-03-31 ENCOUNTER — Ambulatory Visit: Payer: Medicare Other | Admitting: Adult Health

## 2014-04-03 DIAGNOSIS — J45909 Unspecified asthma, uncomplicated: Secondary | ICD-10-CM | POA: Diagnosis not present

## 2014-04-03 DIAGNOSIS — R062 Wheezing: Secondary | ICD-10-CM | POA: Diagnosis not present

## 2014-04-03 DIAGNOSIS — I82409 Acute embolism and thrombosis of unspecified deep veins of unspecified lower extremity: Secondary | ICD-10-CM | POA: Diagnosis not present

## 2014-04-13 ENCOUNTER — Ambulatory Visit (INDEPENDENT_AMBULATORY_CARE_PROVIDER_SITE_OTHER): Payer: Medicare Other

## 2014-04-13 DIAGNOSIS — J4520 Mild intermittent asthma, uncomplicated: Secondary | ICD-10-CM

## 2014-04-13 MED ORDER — OMALIZUMAB 150 MG ~~LOC~~ SOLR
300.0000 mg | Freq: Once | SUBCUTANEOUS | Status: AC
  Administered 2014-04-13: 300 mg via SUBCUTANEOUS

## 2014-04-17 ENCOUNTER — Other Ambulatory Visit: Payer: Self-pay | Admitting: Critical Care Medicine

## 2014-04-25 ENCOUNTER — Other Ambulatory Visit: Payer: Self-pay | Admitting: Cardiovascular Disease

## 2014-04-27 ENCOUNTER — Ambulatory Visit (INDEPENDENT_AMBULATORY_CARE_PROVIDER_SITE_OTHER): Payer: Medicare Other

## 2014-04-27 DIAGNOSIS — J4520 Mild intermittent asthma, uncomplicated: Secondary | ICD-10-CM | POA: Diagnosis not present

## 2014-04-28 MED ORDER — OMALIZUMAB 150 MG ~~LOC~~ SOLR
300.0000 mg | Freq: Once | SUBCUTANEOUS | Status: AC
  Administered 2014-04-27: 300 mg via SUBCUTANEOUS

## 2014-05-01 ENCOUNTER — Ambulatory Visit (INDEPENDENT_AMBULATORY_CARE_PROVIDER_SITE_OTHER): Payer: Medicare Other | Admitting: Critical Care Medicine

## 2014-05-01 ENCOUNTER — Encounter: Payer: Self-pay | Admitting: Critical Care Medicine

## 2014-05-01 VITALS — BP 134/62 | HR 80 | Temp 97.7°F | Ht 64.5 in | Wt 164.0 lb

## 2014-05-01 DIAGNOSIS — J4550 Severe persistent asthma, uncomplicated: Secondary | ICD-10-CM

## 2014-05-01 DIAGNOSIS — R911 Solitary pulmonary nodule: Secondary | ICD-10-CM | POA: Diagnosis not present

## 2014-05-01 NOTE — Assessment & Plan Note (Signed)
Severe persistent asthma stable at present Plan No change in inhaled or maintenance medications. Return in  3 months

## 2014-05-01 NOTE — Patient Instructions (Signed)
No change in medications. Return in      3 months We will repeat CT Chest in 3 months

## 2014-05-01 NOTE — Progress Notes (Signed)
Subjective:    Patient ID: [name], female    DOB: [birth year], 80 y.o.   MRN: [id number]  HPI  05/01/2014 Chief Complaint  Patient presents with  . 3 month follow up    SOB better some days than others.  Coughing moslty when laying down, PND.  Chest tightness at times.     Pt is about the same,  Min cough, esp qhs .  Cough is dry at present.  Under Rx for DVT.  No bleeding from anticoagulant Pt awakens nightly with cough.     PUL ASTHMA HISTORY 05/01/2014 02/15/2014 01/12/2013 11/15/2012 09/29/2012 07/22/2012 07/21/2012  Symptoms Daily Daily Daily Daily 0-2 days/week Daily Daily  Nighttime awakenings >1/wk but not nightly 0-2/month Often--7/wk 0-2/month 0-2/month 3-4/month 3-4/month  Interference with activity Some limitations Some limitations Extreme limitations Some limitations Minor limitations Some limitations Some limitations  SABA use > 2 days/wk--not > 1 x/day > 2 days/wk--not > 1 x/day 0-2 days/wk > 2 days/wk--not > 1 x/day 0-2 days/wk Daily Daily  Exacerbations requiring oral steroids 2 or more / year 0-1 / year 0-1 / year 2 or more / year 0-1 / year 2 or more / year -    Review of Systems Constitutional:   No  weight loss, night sweats,  Fevers, chills, +++fatigue,+++ lassitude. HEENT:   No headaches,  Difficulty swallowing,  Tooth/dental problems,  Sore throat,                No sneezing, itching, ear ache, nasal congestion, ++++post nasal drip,   CV:  No chest pain,  Orthopnea, PND, swelling in lower extremities, anasarca, dizziness, palpitations  GI  No heartburn, indigestion, abdominal pain, nausea, vomiting, diarrhea, change in bowel habits, loss of appetite  Resp: Notes shortness of breath with exertion not at rest.  No excess mucus, no productive cough,  Notes non-productive cough,  No coughing up of blood.  No change in color of mucus.  No wheezing.  No chest wall deformity  Skin: no rash or lesions.  GU: no dysuria, change in color of urine, no urgency or  frequency.  No flank pain.  MS:  No joint pain or swelling.  No decreased range of motion.  No back pain.  Psych:  No change in mood or affect. No depression or anxiety.  No memory loss.     Objective:   Physical Exam Filed Vitals:   05/01/14 0919  BP: 134/62  Pulse: 80  Temp: 97.7 F (36.5 C)  TempSrc: Oral  Height: 5' 4.5" (1.638 m)  Weight: 74.39 kg (164 lb)  SpO2: 98%    Gen: Pleasant, well-nourished, in no distress,  normal affect  ENT: No lesions,  mouth clear,  oropharynx clear, no postnasal drip  Neck: No JVD, no TMG, no carotid bruits  Lungs: No use of accessory muscles, no dullness to percussion, distant BS  Cardiovascular: RRR, heart sounds normal, no murmur or gallops, no peripheral edema  Abdomen: soft and NT, no HSM,  BS normal  Musculoskeletal: No deformities, no cyanosis or clubbing  Neuro: alert, non focal  Skin: Warm, no lesions or rashes  No results found.     Assessment & Plan:   Severe persistent asthma Severe persistent asthma stable at present Plan No change in inhaled or maintenance medications. Return in  3 months    Updated Medication List Outpatient Encounter Prescriptions as of 05/01/2014  Medication Sig  . albuterol (PROAIR HFA) 108 (90 BASE) MCG/ACT inhaler Inhale 2 puffs  into the lungs every 6 (six) hours as needed for wheezing or shortness of breath.  [name] albuterol (PROVENTIL) (2.5 MG/3ML) 0.083% nebulizer solution Take 3 mLs (2.5 mg total) by nebulization every 6 (six) hours as needed for wheezing or shortness of breath.  . ALPRAZolam (XANAX) 0.5 MG tablet Take 0.5 mg by mouth 2 (two) times daily as needed for anxiety.   [name] azelastine (ASTELIN) 0.1 % nasal spray USE 2 SPRAYS IN EACH NOSTRIL TWICE A DAY AS DIRECTED  . benzonatate (TESSALON) 100 MG capsule Take 100 mg by mouth 3 (three) times daily as needed for cough.  . Calcium Carbonate-Vitamin D (CALTRATE 600+D) 600-400 MG-UNIT per tablet Take 1 tablet by mouth daily.   .  chlorpheniramine (CHLOR-TRIMETON) 4 MG tablet Take by mouth. 4 mg in the morning and 8 mg at bedtime  . Cholecalciferol (VITAMIN D3) 1000 UNITS tablet Take 1,000 Units by mouth daily.    . clindamycin (CLEOCIN) 300 MG capsule Take 300 mg by mouth. Before dental work  . dextromethorphan (DELSYM) 30 MG/5ML liquid Take 60 mg by mouth 2 (two) times daily as needed for cough.  . diclofenac sodium (VOLTAREN) 1 % GEL Apply topically as needed.  [name] EPINEPHrine (EPI-PEN) 0.3 mg/0.3 mL SOAJ injection Inject 0.3 mLs (0.3 mg total) into the muscle once.  [name] esomeprazole (NEXIUM) 40 MG capsule TAKE 1 CAPSULE TWICE A DAY  . fexofenadine (ALLEGRA) 180 MG tablet Take 180 mg by mouth daily.    . furosemide (LASIX) 40 MG tablet TAKE ONE-HALF (1/2) TABLET (20 MG TOTAL) EVERY MORNING  . ibandronate (BONIVA) 150 MG tablet Take 1 tablet by mouth every 30 (thirty) days.  [name] KLOR-CON 10 10 MEQ tablet TAKE 1 TABLET DAILY  . montelukast (SINGULAIR) 10 MG tablet TAKE 1 TABLET AT BEDTIME  . Multiple Vitamin (MULTIVITAMIN) tablet Take 1 tablet by mouth daily.  [name] NASONEX 50 MCG/ACT nasal spray USE 2 SPRAYS NASALLY TWICE A DAY  . pramipexole (MIRAPEX) 0.125 MG tablet Take 0.375 mg by mouth at bedtime.   . rosuvastatin (CRESTOR) 5 MG tablet Take 1 tablet (5 mg total) by mouth at bedtime.  [name] Spacer/Aero-Holding Chambers (AEROCHAMBER MV) inhaler by Other route. Use as instructed   . SYMBICORT 160-4.5 MCG/ACT inhaler INHALE 2 PUFFS INTO THE LUNGS TWICE A DAY  . telmisartan (MICARDIS) 40 MG tablet Take 0.5 tablets (20 mg total) by mouth at bedtime.  . traMADol (ULTRAM) 50 MG tablet Take 1 tablet by mouth as needed.   . triamcinolone cream (KENALOG) 0.5 % Apply 1 application topically 3 (three) times daily as needed.  . trolamine salicylate (ASPERCREME) 10 % cream Apply 1 application topically 2 (two) times daily as needed (pain).   . vitamin B-12 (CYANOCOBALAMIN) 500 MCG tablet Take 500 mcg by mouth daily.    [name] Reasons 20 MG TABS  tablet Take 20 mg by mouth daily.  . [DISCONTINUED] mirabegron ER (MYRBETRIQ) 25 MG TB24 tablet Take 25 mg by mouth daily.

## 2014-05-02 DIAGNOSIS — M8588 Other specified disorders of bone density and structure, other site: Secondary | ICD-10-CM | POA: Diagnosis not present

## 2014-05-10 ENCOUNTER — Other Ambulatory Visit: Payer: Self-pay | Admitting: Cardiovascular Disease

## 2014-05-11 ENCOUNTER — Ambulatory Visit (INDEPENDENT_AMBULATORY_CARE_PROVIDER_SITE_OTHER): Payer: Medicare Other

## 2014-05-11 DIAGNOSIS — J4520 Mild intermittent asthma, uncomplicated: Secondary | ICD-10-CM | POA: Diagnosis not present

## 2014-05-17 MED ORDER — OMALIZUMAB 150 MG ~~LOC~~ SOLR
300.0000 mg | Freq: Once | SUBCUTANEOUS | Status: AC
  Administered 2014-05-11: 300 mg via SUBCUTANEOUS

## 2014-05-25 ENCOUNTER — Ambulatory Visit (INDEPENDENT_AMBULATORY_CARE_PROVIDER_SITE_OTHER): Payer: Medicare Other

## 2014-05-25 DIAGNOSIS — J4550 Severe persistent asthma, uncomplicated: Secondary | ICD-10-CM

## 2014-05-26 MED ORDER — OMALIZUMAB 150 MG ~~LOC~~ SOLR
300.0000 mg | Freq: Once | SUBCUTANEOUS | Status: AC
  Administered 2014-05-25: 300 mg via SUBCUTANEOUS

## 2014-06-08 ENCOUNTER — Ambulatory Visit (INDEPENDENT_AMBULATORY_CARE_PROVIDER_SITE_OTHER): Payer: Medicare Other

## 2014-06-08 DIAGNOSIS — J4550 Severe persistent asthma, uncomplicated: Secondary | ICD-10-CM | POA: Diagnosis not present

## 2014-06-09 MED ORDER — OMALIZUMAB 150 MG ~~LOC~~ SOLR
300.0000 mg | Freq: Once | SUBCUTANEOUS | Status: AC
  Administered 2014-06-08: 300 mg via SUBCUTANEOUS

## 2014-06-22 ENCOUNTER — Ambulatory Visit (INDEPENDENT_AMBULATORY_CARE_PROVIDER_SITE_OTHER): Payer: Medicare Other

## 2014-06-22 DIAGNOSIS — J4550 Severe persistent asthma, uncomplicated: Secondary | ICD-10-CM | POA: Diagnosis not present

## 2014-06-29 MED ORDER — OMALIZUMAB 150 MG ~~LOC~~ SOLR
300.0000 mg | Freq: Once | SUBCUTANEOUS | Status: AC
  Administered 2014-06-22: 300 mg via SUBCUTANEOUS

## 2014-07-04 ENCOUNTER — Other Ambulatory Visit: Payer: Self-pay | Admitting: Critical Care Medicine

## 2014-07-06 ENCOUNTER — Ambulatory Visit: Payer: Medicare Other

## 2014-07-06 ENCOUNTER — Ambulatory Visit (INDEPENDENT_AMBULATORY_CARE_PROVIDER_SITE_OTHER): Payer: Medicare Other

## 2014-07-06 DIAGNOSIS — J4550 Severe persistent asthma, uncomplicated: Secondary | ICD-10-CM

## 2014-07-06 MED ORDER — OMALIZUMAB 150 MG ~~LOC~~ SOLR
300.0000 mg | Freq: Once | SUBCUTANEOUS | Status: AC
  Administered 2014-07-06: 300 mg via SUBCUTANEOUS

## 2014-07-20 ENCOUNTER — Ambulatory Visit (INDEPENDENT_AMBULATORY_CARE_PROVIDER_SITE_OTHER): Payer: Medicare Other

## 2014-07-20 DIAGNOSIS — J4550 Severe persistent asthma, uncomplicated: Secondary | ICD-10-CM | POA: Diagnosis not present

## 2014-07-20 MED ORDER — OMALIZUMAB 150 MG ~~LOC~~ SOLR
300.0000 mg | Freq: Once | SUBCUTANEOUS | Status: AC
Start: 2014-07-20 — End: 2014-07-20
  Administered 2014-07-20: 300 mg via SUBCUTANEOUS

## 2014-07-31 ENCOUNTER — Ambulatory Visit (INDEPENDENT_AMBULATORY_CARE_PROVIDER_SITE_OTHER)
Admission: RE | Admit: 2014-07-31 | Discharge: 2014-07-31 | Disposition: A | Discharge status: Home / Self Care | Payer: Medicare Other | Source: Ambulatory Visit | Attending: Critical Care Medicine | Admitting: Critical Care Medicine

## 2014-07-31 DIAGNOSIS — I2510 Atherosclerotic heart disease of native coronary artery without angina pectoris: Secondary | ICD-10-CM | POA: Diagnosis not present

## 2014-07-31 DIAGNOSIS — R911 Solitary pulmonary nodule: Secondary | ICD-10-CM | POA: Diagnosis not present

## 2014-08-02 ENCOUNTER — Ambulatory Visit (INDEPENDENT_AMBULATORY_CARE_PROVIDER_SITE_OTHER): Payer: Medicare Other | Admitting: Critical Care Medicine

## 2014-08-02 ENCOUNTER — Encounter: Payer: Self-pay | Admitting: Critical Care Medicine

## 2014-08-02 VITALS — BP 152/86 | HR 80 | Temp 97.5°F | Ht 65.0 in | Wt 154.8 lb

## 2014-08-02 DIAGNOSIS — J4551 Severe persistent asthma with (acute) exacerbation: Secondary | ICD-10-CM | POA: Diagnosis not present

## 2014-08-02 DIAGNOSIS — J189 Pneumonia, unspecified organism: Secondary | ICD-10-CM | POA: Diagnosis not present

## 2014-08-02 DIAGNOSIS — R911 Solitary pulmonary nodule: Secondary | ICD-10-CM

## 2014-08-02 MED ORDER — MOMETASONE FUROATE 50 MCG/ACT NA SUSP: NASAL | Status: DC

## 2014-08-02 MED ORDER — ALBUTEROL SULFATE HFA 108 (90 BASE) MCG/ACT IN AERS: 2.0000 | INHALATION_SPRAY | Freq: Four times a day (QID) | RESPIRATORY_TRACT | Status: DC | PRN

## 2014-08-02 MED ORDER — AZITHROMYCIN 250 MG PO TABS: ORAL_TABLET | ORAL | Status: DC

## 2014-08-02 NOTE — Patient Instructions (Signed)
Azithromycin 250mg  : Two daily for 3 days Depomedrol 120mg  once was given [facility]ills [facility] No other changes Return 10days with tammy parrett Return Dr Joya Gaskins 3 months

## 2014-08-02 NOTE — Progress Notes (Signed)
Subjective:    Patient ID: [name], female    DOB: 03-07-1933, 81 y.o.   MRN: [id number]  HPI  08/02/2014 Chief Complaint  Patient presents with  . 3 month follow up    with CT Chest.  increased cough and congestion with clear mucus, wheezing, chest tightness, and increased SOB x 8 days.  No f/c/s.  Pt notes more congestion and coughing more for 8days, pt in pollen outdoors and this ppt the symptoms.  No f/c/s. Mucus is clear, no discolored.  Pt had some chest pain in lower lobes.  Pt using SABA 1x every day or every other day Notes more sinus pndrip.  Mucus out of nose is clear.  Notes wheezing and chest tightness.  No GERD. Pt lost 10# ? If on purpose.  Pt had work done on the home.     PUL ASTHMA HISTORY 08/02/2014 05/01/2014 02/15/2014 01/12/2013 11/15/2012 09/29/2012 07/22/2012  Symptoms Throughout the day Daily Daily Daily Daily 0-2 days/week Daily  Nighttime awakenings Often--7/wk >1/wk but not nightly 0-2/month Often--7/wk 0-2/month 0-2/month 3-4/month  Interference with activity Extreme limitations Some limitations Some limitations Extreme limitations Some limitations Minor limitations Some limitations  SABA use > 2 days/wk--not > 1 x/day > 2 days/wk--not > 1 x/day > 2 days/wk--not > 1 x/day 0-2 days/wk > 2 days/wk--not > 1 x/day 0-2 days/wk Daily  Exacerbations requiring oral steroids 0-1 / year 2 or more / year 0-1 / year 0-1 / year 2 or more / year 0-1 / year 2 or more / year    Review of Systems Constitutional:   No  weight loss, night sweats,  Fevers, chills, +++fatigue,+++ lassitude. HEENT:   No headaches,  Difficulty swallowing,  Tooth/dental problems,  Sore throat,                No sneezing, itching, ear ache, nasal congestion, ++++post nasal drip,   CV:  No chest pain,  Orthopnea, PND, swelling in lower extremities, anasarca, dizziness, palpitations  GI  No heartburn, indigestion, abdominal pain, nausea, vomiting, diarrhea, change in bowel habits, loss of  appetite  Resp: Notes shortness of breath with exertion not at rest.  No excess mucus, no productive cough,  Notes non-productive cough,  No coughing up of blood.  No change in color of mucus.  No wheezing.  No chest wall deformity  Skin: no rash or lesions.  GU: no dysuria, change in color of urine, no urgency or frequency.  No flank pain.  MS:  No joint pain or swelling.  No decreased range of motion.  No back pain.  Psych:  No change in mood or affect. No depression or anxiety.  No memory loss.     Objective:   Physical Exam Filed Vitals:   08/02/14 0957  BP: 152/86  Pulse: 80  Temp: 97.5 F (36.4 C)  Height: 5\' 5"  (1.651 m)  Weight: 154 lb 12.8 oz (70.217 kg)  SpO2: 95%    Gen: Pleasant, well-nourished, in no distress,  normal affect  ENT: No lesions,  mouth clear,  oropharynx clear, no postnasal drip  Neck: No JVD, no TMG, no carotid bruits  Lungs: No use of accessory muscles, no dullness to percussion, insp exp wheezes   Cardiovascular: RRR, heart sounds normal, no murmur or gallops, no peripheral edema  Abdomen: soft and NT, no HSM,  BS normal  Musculoskeletal: No deformities, no cyanosis or clubbing  Neuro: alert, non focal  Skin: Warm, no lesions or rashes  Ct  Chest Wo Contrast  07/31/2014   CLINICAL DATA:  Followup pulmonary nodule, subsequent encounter. Productive cough for 1 week and slight shortness of breath.  EXAM: CT CHEST WITHOUT CONTRAST  TECHNIQUE: Multidetector CT imaging of the chest was performed following the standard protocol without IV contrast.  COMPARISON:  03/29/2014, 06/26/2009, 12/09/2006 and 03/12/2006.  FINDINGS: Mediastinum/Nodes: Low-attenuation lesions in the thyroid measure up to 1.3 cm on the right, stable. Mediastinal lymph nodes are not enlarged by CT size criteria. Hilar regions are difficult to definitively evaluate without IV contrast. No axillary adenopathy. Coronary artery calcification. Heart size normal. No pericardial  effusion.  Lungs/Pleura: Minimal biapical pleural parenchymal scarring. Image quality is degraded by respiratory motion. A few scattered foci of ground-glass are seen primarily in the upper lobes, new from the prior exam. Subpleural left upper lobe nodule measures 7 mm, grossly stable from 03/12/2006. Mild ground-glass in the subpleural right lower lobe (image 41), new. Mild scarring along the left hemidiaphragm. No pleural fluid. Airway is otherwise unremarkable.  Upper abdomen: Incidental imaging of the upper abdomen shows the visualized portions of the liver and adrenal glands to be grossly unremarkable. A splenule is noted. Visualized portions of the spleen, pancreas, stomach and bowel are grossly unremarkable. No upper abdominal adenopathy.  Musculoskeletal: No worrisome lytic or sclerotic lesions. Degenerative changes are seen in the spine.  IMPRESSION: 1. Subpleural left upper lobe nodule is likely stable from prior examinations dating back to 03/12/2006, indicating a benign lesion. 2. Scattered foci of ground-glass in the upper lobes and mild ground-glass in the subpleural right lower lobe, new from the prior examination and indicative of bronchopneumonia in this patient with a history of a productive cough for 1 week. 3. Coronary artery calcification.   Electronically Signed   By: Lorin Picket M.D.   On: 07/31/2014 11:35       Assessment & Plan:   Severe persistent asthma Severe persistent asthma with acute rhinitis and patchy bronchopneumonia prob post viral, with asthma flare Plan Azithromycin 250mg  : Two daily for 3 days Depomedrol 120mg  once was given [facility]ills [facility] on [facility] No other changes Return 10days with tammy parrett Return Dr Joya Gaskins 3 months     Lung nodule Solitary lung nodule , unchanged since 2007 No further CT Scan needed     Updated Medication List Outpatient Encounter Prescriptions as of 08/02/2014  Medication Sig  . albuterol  (PROAIR HFA) 108 (90 BASE) MCG/ACT inhaler Inhale 2 puffs into the lungs every 6 (six) hours as needed for wheezing or shortness of breath.  [name] albuterol (PROVENTIL) (2.5 MG/3ML) 0.083% nebulizer solution Take 3 mLs (2.5 mg total) by nebulization every 6 (six) hours as needed for wheezing or shortness of breath.  . ALPRAZolam (XANAX) 0.5 MG tablet Take 0.5 mg by mouth 2 (two) times daily as needed for anxiety.   [name] azelastine (ASTELIN) 0.1 % nasal spray USE 2 SPRAYS IN EACH NOSTRIL TWICE A DAY AS DIRECTED  . benzonatate (TESSALON) 100 MG capsule Take 100 mg by mouth 3 (three) times daily as needed for cough.  . Calcium Carbonate-Vitamin D (CALTRATE 600+D) 600-400 MG-UNIT per tablet Take 1 tablet by mouth daily.   . chlorpheniramine (CHLOR-TRIMETON) 4 MG tablet Take by mouth. 4 mg in the morning and 8 mg at bedtime  . Cholecalciferol (VITAMIN D3) 1000 UNITS tablet Take 1,000 Units by mouth daily.    . CRESTOR 5 MG tablet TAKE 1 TABLET AT BEDTIME  . dextromethorphan (DELSYM) 30 MG/5ML  liquid Take 60 mg by mouth 2 (two) times daily as needed for cough.  . diclofenac sodium (VOLTAREN) 1 % GEL Apply topically as needed.  [name] EPINEPHrine (EPI-PEN) 0.3 mg/0.3 mL SOAJ injection Inject 0.3 mLs (0.3 mg total) into the muscle once.  [name] esomeprazole (NEXIUM) 40 MG capsule TAKE 1 CAPSULE TWICE A DAY  . fexofenadine (ALLEGRA) 180 MG tablet Take 180 mg by mouth daily.    . furosemide (LASIX) 40 MG tablet TAKE ONE-HALF (1/2) TABLET (20 MG TOTAL) EVERY MORNING  . KLOR-CON 10 10 MEQ tablet TAKE 1 TABLET DAILY  . mometasone (NASONEX) 50 MCG/ACT nasal spray USE 2 SPRAYS NASALLY TWICE A DAY  . montelukast (SINGULAIR) 10 MG tablet TAKE 1 TABLET AT BEDTIME  . Multiple Vitamin (MULTIVITAMIN) tablet Take 1 tablet by mouth daily.  . pramipexole (MIRAPEX) 0.125 MG tablet Take 0.375 mg by mouth at bedtime.   [name] Spacer/Aero-Holding Chambers (AEROCHAMBER MV) inhaler by Other route. Use as instructed   . SYMBICORT 160-4.5 MCG/ACT  inhaler INHALE 2 PUFFS INTO THE LUNGS TWICE A DAY  . telmisartan (MICARDIS) 40 MG tablet Take 0.5 tablets (20 mg total) by mouth at bedtime.  . traMADol (ULTRAM) 50 MG tablet Take 1 tablet by mouth as needed.   . triamcinolone cream (KENALOG) 0.5 % Apply 1 application topically 3 (three) times daily as needed.  . trolamine salicylate (ASPERCREME) 10 % cream Apply 1 application topically 2 (two) times daily as needed (pain).   . vitamin B-12 (CYANOCOBALAMIN) 500 MCG tablet Take 500 mcg by mouth daily.    [name] Reasons 20 MG TABS tablet Take 20 mg by mouth daily.  . [DISCONTINUED] albuterol (PROAIR HFA) 108 (90 BASE) MCG/ACT inhaler Inhale 2 puffs into the lungs every 6 (six) hours as needed for wheezing or shortness of breath.  . [DISCONTINUED] NASONEX 50 MCG/ACT nasal spray USE 2 SPRAYS NASALLY TWICE A DAY  . azithromycin (ZITHROMAX) 250 MG tablet Take 2 daily  . clindamycin (CLEOCIN) 300 MG capsule Take 300 mg by mouth. Before dental work  . [DISCONTINUED] ibandronate (BONIVA) 150 MG tablet Take 1 tablet by mouth every 30 (thirty) days.

## 2014-08-03 ENCOUNTER — Ambulatory Visit: Payer: Medicare Other

## 2014-08-03 DIAGNOSIS — R911 Solitary pulmonary nodule: Secondary | ICD-10-CM | POA: Insufficient documentation

## 2014-08-03 NOTE — Assessment & Plan Note (Addendum)
Severe persistent asthma with acute rhinitis and patchy bronchopneumonia prob post viral, with asthma flare Plan Azithromycin 250mg  : Two daily for 3 days Depomedrol 120mg  once was given [facility]ills [facility] on [facility] No other changes Return 10days with tammy parrett Return Dr Joya Gaskins 3 months

## 2014-08-03 NOTE — Assessment & Plan Note (Signed)
Solitary lung nodule , unchanged since 2007 No further CT Scan needed

## 2014-08-10 ENCOUNTER — Ambulatory Visit (INDEPENDENT_AMBULATORY_CARE_PROVIDER_SITE_OTHER): Payer: Medicare Other

## 2014-08-10 DIAGNOSIS — J4550 Severe persistent asthma, uncomplicated: Secondary | ICD-10-CM

## 2014-08-11 DIAGNOSIS — J4550 Severe persistent asthma, uncomplicated: Secondary | ICD-10-CM

## 2014-08-11 MED ORDER — OMALIZUMAB 150 MG ~~LOC~~ SOLR
300.0000 mg | Freq: Once | SUBCUTANEOUS | Status: AC
  Administered 2014-08-11: 300 mg via SUBCUTANEOUS

## 2014-08-17 ENCOUNTER — Ambulatory Visit (INDEPENDENT_AMBULATORY_CARE_PROVIDER_SITE_OTHER)
Admission: RE | Admit: 2014-08-17 | Discharge: 2014-08-17 | Disposition: A | Discharge status: Home / Self Care | Payer: Medicare Other | Source: Ambulatory Visit | Attending: Adult Health | Admitting: Adult Health

## 2014-08-17 ENCOUNTER — Encounter: Payer: Self-pay | Admitting: Adult Health

## 2014-08-17 ENCOUNTER — Ambulatory Visit (INDEPENDENT_AMBULATORY_CARE_PROVIDER_SITE_OTHER): Payer: Medicare Other | Admitting: Adult Health

## 2014-08-17 ENCOUNTER — Telehealth: Payer: Self-pay | Admitting: Adult Health

## 2014-08-17 VITALS — BP 140/60 | HR 70 | Temp 97.4°F | Ht 65.0 in | Wt 152.6 lb

## 2014-08-17 DIAGNOSIS — J4551 Severe persistent asthma with (acute) exacerbation: Secondary | ICD-10-CM | POA: Diagnosis not present

## 2014-08-17 DIAGNOSIS — J301 Allergic rhinitis due to pollen: Secondary | ICD-10-CM | POA: Diagnosis not present

## 2014-08-17 DIAGNOSIS — J189 Pneumonia, unspecified organism: Secondary | ICD-10-CM | POA: Diagnosis not present

## 2014-08-17 DIAGNOSIS — J449 Chronic obstructive pulmonary disease, unspecified: Secondary | ICD-10-CM | POA: Diagnosis not present

## 2014-08-17 NOTE — Telephone Encounter (Signed)
Pt would like her chest xray results from today.  Please advise. Thanks.

## 2014-08-17 NOTE — Patient Instructions (Addendum)
May Mucinex DM twice daily for cough Continue with saline nasal rinses.  Continue on Allegra daily for drainage.  Chest x-ray today Follow up with Dr. Joya Gaskins  3 months and As needed   Please contact office for sooner follow up if symptoms do not improve or worsen or seek emergency care

## 2014-08-17 NOTE — Assessment & Plan Note (Signed)
Mild flare with spring allergens  Plan  May Mucinex DM twice daily for cough Continue with saline nasal rinses.  Continue on Allegra daily for drainage.  Follow up with Dr. Joya Gaskins  3 months and As needed   Please contact office for sooner follow up if symptoms do not improve or worsen or seek emergency care

## 2014-08-17 NOTE — Assessment & Plan Note (Signed)
Recent pneumonia noted on CT chest in the setting of an asthma exacerbation Clinically, patient is improved with antibiotics Repeat chest x-ray today Continue with current regimen

## 2014-08-17 NOTE — Progress Notes (Signed)
   Subjective:    Patient ID: [name], female    DOB: 05-17-1932, 81 y.o.   MRN: [id number]  HPI 81 yo with severe persistent asthma   08/17/2014 Follow up : Asthma flare and PNA Patient returns for a two-week follow-up. Was seen last visit with increased cough, congestion and wheezing with notable asthma exacerbation. She has had a recent CT chest. prior to her visit that showed a patchy bronchopneumonia. She was started on a Z-Pak and given a Depo-Medrol injection. Patient is feeling improved with decreased wheezing and cough.. He does have some lingering nasal congestion and drainage. She remains on her nasal sprays and Allegra. She denies any fever, hemoptysis, orthopnea, PND or leg swelling. Appetite is fair with no nausea, vomiting or diarrhea.    Review of Systems Constitutional:   No  weight loss, night sweats,  Fevers, chills,  +fatigue, or  lassitude.  HEENT:   No headaches,  Difficulty swallowing,  Tooth/dental problems, or  Sore throat,                No sneezing, itching, ear ache,  +nasal congestion, post nasal drip,   CV:  No chest pain,  Orthopnea, PND, swelling in lower extremities, anasarca, dizziness, palpitations, syncope.   GI  No heartburn, indigestion, abdominal pain, nausea, vomiting, diarrhea, change in bowel habits, loss of appetite, bloody stools.   Resp: .  No chest wall deformity  Skin: no rash or lesions.  GU: no dysuria, change in color of urine, no urgency or frequency.  No flank pain, no hematuria   MS:  No joint pain or swelling.  No decreased range of motion.  No back pain.  Psych:  No change in mood or affect. No depression or anxiety.  No memory loss.         Objective:   Physical Exam GEN: A/Ox3; pleasant , NAD, elderly   HEENT:  [place]/AT,  EACs-clear, TMs-wnl, NOSE-clear drainage , THROAT-clear, no lesions, no postnasal drip or exudate noted.   NECK:  Supple w/ fair ROM; no JVD; normal carotid impulses w/o bruits; no  thyromegaly or nodules palpated; no lymphadenopathy.  RESP  Clear  P & A; w/o, wheezes/ rales/ or rhonchi.no accessory muscle use, no dullness to percussion  CARD:  RRR, no m/r/g  , no peripheral edema, pulses intact, no cyanosis or clubbing.  GI:   Soft & nt; nml bowel sounds; no organomegaly or masses detected.  Musco: Warm bil, no deformities or joint swelling noted.   Neuro: alert, no focal deficits noted.    Skin: Warm, no lesions or rashes         Assessment & Plan:

## 2014-08-17 NOTE — Assessment & Plan Note (Signed)
Recent exacerbation, now resolving.  Plan May Mucinex DM twice daily for cough Continue with saline nasal rinses.  Continue on Allegra daily for drainage.  Follow up with Dr. Joya Gaskins  3 months and As needed   Please contact office for sooner follow up if symptoms do not improve or worsen or seek emergency care

## 2014-08-17 NOTE — Telephone Encounter (Signed)
Chronic changes  Cont with ov recs

## 2014-08-18 NOTE — Telephone Encounter (Signed)
Pt is aware of results. Nothing further was needed. 

## 2014-08-24 ENCOUNTER — Ambulatory Visit (INDEPENDENT_AMBULATORY_CARE_PROVIDER_SITE_OTHER): Payer: Medicare Other

## 2014-08-24 ENCOUNTER — Telehealth: Payer: Self-pay | Admitting: Adult Health

## 2014-08-24 DIAGNOSIS — J4540 Moderate persistent asthma, uncomplicated: Secondary | ICD-10-CM

## 2014-08-24 MED ORDER — OMALIZUMAB 150 MG ~~LOC~~ SOLR
300.0000 mg | Freq: Once | SUBCUTANEOUS | Status: AC
  Administered 2014-08-24: 300 mg via SUBCUTANEOUS

## 2014-08-24 NOTE — Telephone Encounter (Signed)
Spoke with pt about her results. Nothing further was needed.

## 2014-08-28 DIAGNOSIS — R8299 Other abnormal findings in urine: Secondary | ICD-10-CM | POA: Diagnosis not present

## 2014-08-28 DIAGNOSIS — E039 Hypothyroidism, unspecified: Secondary | ICD-10-CM | POA: Diagnosis not present

## 2014-08-28 DIAGNOSIS — N390 Urinary tract infection, site not specified: Secondary | ICD-10-CM | POA: Diagnosis not present

## 2014-08-28 DIAGNOSIS — M859 Disorder of bone density and structure, unspecified: Secondary | ICD-10-CM | POA: Diagnosis not present

## 2014-08-28 DIAGNOSIS — I10 Essential (primary) hypertension: Secondary | ICD-10-CM | POA: Diagnosis not present

## 2014-08-28 DIAGNOSIS — E785 Hyperlipidemia, unspecified: Secondary | ICD-10-CM | POA: Diagnosis not present

## 2014-09-05 DIAGNOSIS — J45909 Unspecified asthma, uncomplicated: Secondary | ICD-10-CM | POA: Diagnosis not present

## 2014-09-05 DIAGNOSIS — Z0000 Encounter for general adult medical examination without abnormal findings: Secondary | ICD-10-CM | POA: Diagnosis not present

## 2014-09-05 DIAGNOSIS — R609 Edema, unspecified: Secondary | ICD-10-CM | POA: Diagnosis not present

## 2014-09-05 DIAGNOSIS — N951 Menopausal and female climacteric states: Secondary | ICD-10-CM | POA: Diagnosis not present

## 2014-09-05 DIAGNOSIS — I890 Lymphedema, not elsewhere classified: Secondary | ICD-10-CM | POA: Diagnosis not present

## 2014-09-05 DIAGNOSIS — M5380 Other specified dorsopathies, site unspecified: Secondary | ICD-10-CM | POA: Diagnosis not present

## 2014-09-05 DIAGNOSIS — Z6826 Body mass index (BMI) 26.0-26.9, adult: Secondary | ICD-10-CM | POA: Diagnosis not present

## 2014-09-05 DIAGNOSIS — F419 Anxiety disorder, unspecified: Secondary | ICD-10-CM | POA: Diagnosis not present

## 2014-09-05 DIAGNOSIS — I82409 Acute embolism and thrombosis of unspecified deep veins of unspecified lower extremity: Secondary | ICD-10-CM | POA: Diagnosis not present

## 2014-09-05 DIAGNOSIS — R7301 Impaired fasting glucose: Secondary | ICD-10-CM | POA: Diagnosis not present

## 2014-09-05 DIAGNOSIS — N3281 Overactive bladder: Secondary | ICD-10-CM | POA: Diagnosis not present

## 2014-09-05 DIAGNOSIS — T7840XD Allergy, unspecified, subsequent encounter: Secondary | ICD-10-CM | POA: Diagnosis not present

## 2014-09-07 ENCOUNTER — Ambulatory Visit (INDEPENDENT_AMBULATORY_CARE_PROVIDER_SITE_OTHER): Payer: Medicare Other

## 2014-09-07 DIAGNOSIS — J4550 Severe persistent asthma, uncomplicated: Secondary | ICD-10-CM

## 2014-09-08 MED ORDER — OMALIZUMAB 150 MG ~~LOC~~ SOLR
300.0000 mg | Freq: Once | SUBCUTANEOUS | Status: AC
  Administered 2014-09-07: 300 mg via SUBCUTANEOUS

## 2014-09-12 DIAGNOSIS — Z1212 Encounter for screening for malignant neoplasm of rectum: Secondary | ICD-10-CM | POA: Diagnosis not present

## 2014-09-13 DIAGNOSIS — H43813 Vitreous degeneration, bilateral: Secondary | ICD-10-CM | POA: Diagnosis not present

## 2014-09-13 DIAGNOSIS — H40013 Open angle with borderline findings, low risk, bilateral: Secondary | ICD-10-CM | POA: Diagnosis not present

## 2014-09-13 DIAGNOSIS — H02834 Dermatochalasis of left upper eyelid: Secondary | ICD-10-CM | POA: Diagnosis not present

## 2014-09-13 DIAGNOSIS — H04123 Dry eye syndrome of bilateral lacrimal glands: Secondary | ICD-10-CM | POA: Diagnosis not present

## 2014-09-13 DIAGNOSIS — Z961 Presence of intraocular lens: Secondary | ICD-10-CM | POA: Diagnosis not present

## 2014-09-13 DIAGNOSIS — H1851 Endothelial corneal dystrophy: Secondary | ICD-10-CM | POA: Diagnosis not present

## 2014-09-13 DIAGNOSIS — E119 Type 2 diabetes mellitus without complications: Secondary | ICD-10-CM | POA: Diagnosis not present

## 2014-09-13 DIAGNOSIS — H02831 Dermatochalasis of right upper eyelid: Secondary | ICD-10-CM | POA: Diagnosis not present

## 2014-09-18 ENCOUNTER — Telehealth: Payer: Self-pay | Admitting: Critical Care Medicine

## 2014-09-21 ENCOUNTER — Ambulatory Visit (INDEPENDENT_AMBULATORY_CARE_PROVIDER_SITE_OTHER): Payer: Medicare Other

## 2014-09-21 DIAGNOSIS — J4520 Mild intermittent asthma, uncomplicated: Secondary | ICD-10-CM | POA: Diagnosis not present

## 2014-09-21 NOTE — Telephone Encounter (Signed)
#   vials:4 Ordered date:09-18-14 Shipping Date:09-19-14    # Vials:4 Arrival Date:09/19/14  Lot #:[id number]  Exp Date:12/19  

## 2014-09-22 MED ORDER — OMALIZUMAB 150 MG ~~LOC~~ SOLR
300.0000 mg | Freq: Once | SUBCUTANEOUS | Status: AC
  Administered 2014-09-21: 300 mg via SUBCUTANEOUS

## 2014-09-27 ENCOUNTER — Telehealth: Payer: Self-pay | Admitting: Critical Care Medicine

## 2014-09-27 NOTE — Telephone Encounter (Signed)
#   vials:4 Ordered date:09/27/14 Shipping Date:09/29/14

## 2014-09-29 NOTE — Telephone Encounter (Signed)
#   Vials:4 Arrival Date:09/28/14 Lot #[id number] Exp Date:04/2018

## 2014-10-05 ENCOUNTER — Ambulatory Visit (INDEPENDENT_AMBULATORY_CARE_PROVIDER_SITE_OTHER): Payer: Medicare Other

## 2014-10-05 DIAGNOSIS — J4550 Severe persistent asthma, uncomplicated: Secondary | ICD-10-CM

## 2014-10-05 MED ORDER — OMALIZUMAB 150 MG ~~LOC~~ SOLR
300.0000 mg | Freq: Once | SUBCUTANEOUS | Status: AC
  Administered 2014-10-05: 300 mg via SUBCUTANEOUS

## 2014-10-10 DIAGNOSIS — M5090 Cervical disc disorder, unspecified, unspecified cervical region: Secondary | ICD-10-CM | POA: Diagnosis not present

## 2014-10-10 DIAGNOSIS — M79603 Pain in arm, unspecified: Secondary | ICD-10-CM | POA: Diagnosis not present

## 2014-10-10 DIAGNOSIS — M542 Cervicalgia: Secondary | ICD-10-CM | POA: Diagnosis not present

## 2014-10-19 ENCOUNTER — Telehealth: Payer: Self-pay | Admitting: Critical Care Medicine

## 2014-10-19 ENCOUNTER — Ambulatory Visit (INDEPENDENT_AMBULATORY_CARE_PROVIDER_SITE_OTHER): Payer: Medicare Other

## 2014-10-19 DIAGNOSIS — J4520 Mild intermittent asthma, uncomplicated: Secondary | ICD-10-CM | POA: Diagnosis not present

## 2014-10-19 MED ORDER — OMALIZUMAB 150 MG ~~LOC~~ SOLR
300.0000 mg | Freq: Once | SUBCUTANEOUS | Status: AC
  Administered 2014-10-19: 300 mg via SUBCUTANEOUS

## 2014-10-20 NOTE — Telephone Encounter (Signed)
#   vials:4  Ordered date:10/19/2014 Shipping Date:10/23/14

## 2014-10-24 ENCOUNTER — Telehealth: Payer: Self-pay | Admitting: Critical Care Medicine

## 2014-10-24 NOTE — Telephone Encounter (Signed)
Open in error

## 2014-10-24 NOTE — Telephone Encounter (Signed)
#   vials:4 Ordered date:10/19/14 Shipping Date:10/23/14   # Vials:4 Arrival Date:10/24/14 Lot [id number] Exp Date:1/20

## 2014-10-26 DIAGNOSIS — M5090 Cervical disc disorder, unspecified, unspecified cervical region: Secondary | ICD-10-CM | POA: Diagnosis not present

## 2014-10-31 DIAGNOSIS — M5090 Cervical disc disorder, unspecified, unspecified cervical region: Secondary | ICD-10-CM | POA: Diagnosis not present

## 2014-11-02 ENCOUNTER — Ambulatory Visit (INDEPENDENT_AMBULATORY_CARE_PROVIDER_SITE_OTHER): Payer: Medicare Other

## 2014-11-02 DIAGNOSIS — J4550 Severe persistent asthma, uncomplicated: Secondary | ICD-10-CM | POA: Diagnosis not present

## 2014-11-02 MED ORDER — OMALIZUMAB 150 MG ~~LOC~~ SOLR
300.0000 mg | Freq: Once | SUBCUTANEOUS | Status: AC
  Administered 2014-11-02: 300 mg via SUBCUTANEOUS

## 2014-11-03 ENCOUNTER — Encounter: Payer: Self-pay | Admitting: Internal Medicine

## 2014-11-03 DIAGNOSIS — M5090 Cervical disc disorder, unspecified, unspecified cervical region: Secondary | ICD-10-CM | POA: Diagnosis not present

## 2014-11-07 DIAGNOSIS — M5090 Cervical disc disorder, unspecified, unspecified cervical region: Secondary | ICD-10-CM | POA: Diagnosis not present

## 2014-11-09 DIAGNOSIS — M5090 Cervical disc disorder, unspecified, unspecified cervical region: Secondary | ICD-10-CM | POA: Diagnosis not present

## 2014-11-15 DIAGNOSIS — M5090 Cervical disc disorder, unspecified, unspecified cervical region: Secondary | ICD-10-CM | POA: Diagnosis not present

## 2014-11-16 ENCOUNTER — Ambulatory Visit (INDEPENDENT_AMBULATORY_CARE_PROVIDER_SITE_OTHER): Payer: Medicare Other

## 2014-11-16 DIAGNOSIS — J4540 Moderate persistent asthma, uncomplicated: Secondary | ICD-10-CM | POA: Diagnosis not present

## 2014-11-16 MED ORDER — OMALIZUMAB 150 MG ~~LOC~~ SOLR
300.0000 mg | Freq: Once | SUBCUTANEOUS | Status: AC
  Administered 2014-11-16: 300 mg via SUBCUTANEOUS

## 2014-11-17 DIAGNOSIS — M5090 Cervical disc disorder, unspecified, unspecified cervical region: Secondary | ICD-10-CM | POA: Diagnosis not present

## 2014-11-20 ENCOUNTER — Ambulatory Visit (INDEPENDENT_AMBULATORY_CARE_PROVIDER_SITE_OTHER): Payer: Medicare Other | Admitting: Critical Care Medicine

## 2014-11-20 ENCOUNTER — Ambulatory Visit: Payer: Medicare Other | Admitting: Critical Care Medicine

## 2014-11-20 ENCOUNTER — Encounter: Payer: Self-pay | Admitting: Critical Care Medicine

## 2014-11-20 ENCOUNTER — Other Ambulatory Visit: Payer: Medicare Other

## 2014-11-20 VITALS — BP 130/78 | HR 82 | Temp 97.8°F | Ht 65.0 in | Wt 158.8 lb

## 2014-11-20 DIAGNOSIS — J4551 Severe persistent asthma with (acute) exacerbation: Secondary | ICD-10-CM

## 2014-11-20 DIAGNOSIS — J45901 Unspecified asthma with (acute) exacerbation: Secondary | ICD-10-CM | POA: Diagnosis not present

## 2014-11-20 LAB — EOSINOPHIL COUNT: EOS ABS: 0.9 10*3/uL — AB (ref 0.0–0.7)

## 2014-11-20 MED ORDER — METHYLPREDNISOLONE ACETATE 80 MG/ML IJ SUSP
120.0000 mg | Freq: Once | INTRAMUSCULAR | Status: AC
  Administered 2014-11-20: 120 mg via INTRAMUSCULAR

## 2014-11-20 NOTE — Patient Instructions (Signed)
Depomedrol 120mg  IM given An eosinophil count was obtained No other medication changes Return 6 weeks

## 2014-11-20 NOTE — Progress Notes (Signed)
Subjective:    Patient ID: [name], female    DOB: 1932/05/16, 81 y.o.   MRN: [id number]  HPI 11/20/2014 Chief Complaint  Patient presents with  . Follow-up    Weather is causing a lot of congestion, cough, clear mucus.  SOB.  Weather an issue, notes more cough, clear mucus, more dyspnea. Notes some wheezing.  Nasal drip post nasl .  No real fever.    Pt denies any significant sore throat, nasal congestion or excess secretions, fever, chills, sweats, unintended weight loss, pleurtic or exertional chest pain, orthopnea PND, or leg swelling Pt notes more  increase in rescue therapy over baseline, and  waking up needing it and having any early am and nocturnal exacerbations of coughing/wheezing/or dyspnea. Pt also denies any obvious fluctuation in symptoms with  weather or environmental change or other alleviating or aggravating factors PUL ASTHMA HISTORY 11/20/2014 08/02/2014 05/01/2014 02/15/2014 01/12/2013 11/15/2012 09/29/2012  Symptoms Throughout the day Throughout the day Daily Daily Daily Daily 0-2 days/week  Nighttime awakenings Often--7/wk Often--7/wk >1/wk but not nightly 0-2/month Often--7/wk 0-2/month 0-2/month  Interference with activity Extreme limitations Extreme limitations Some limitations Some limitations Extreme limitations Some limitations Minor limitations  SABA use Several times/day > 2 days/wk--not > 1 x/day > 2 days/wk--not > 1 x/day > 2 days/wk--not > 1 x/day 0-2 days/wk > 2 days/wk--not > 1 x/day 0-2 days/wk  Exacerbations requiring oral steroids 2 or more / year 0-1 / year 2 or more / year 0-1 / year 0-1 / year 2 or more / year 0-1 / year     Current Medications, Allergies, Complete Past Medical History, Past Surgical History, Family History, and Social History were reviewed in [place] record per todays encounter:  11/20/2014  Review of Systems  Constitutional: Negative.  Negative for fever, chills and diaphoresis.  HENT: Positive for  postnasal drip, rhinorrhea, sinus pressure, sneezing, sore throat and trouble swallowing. Negative for ear pain and voice change.   Eyes: Negative.   Respiratory: Positive for cough, choking, chest tightness, shortness of breath and wheezing. Negative for apnea and stridor.   Cardiovascular: Negative.  Negative for chest pain, palpitations and leg swelling.  Gastrointestinal: Negative.  Negative for nausea, vomiting, abdominal pain and abdominal distention.  Genitourinary: Negative.   Musculoskeletal: Negative.  Negative for myalgias and arthralgias.  Skin: Negative.  Negative for rash.  Allergic/Immunologic: Negative.  Negative for environmental allergies and food allergies.  Neurological: Negative.  Negative for dizziness, syncope, weakness and headaches.  Hematological: Negative.  Negative for adenopathy. Does not bruise/bleed easily.  Psychiatric/Behavioral: Negative.  Negative for sleep disturbance and agitation. The patient is not nervous/anxious.        Objective:   Physical Exam Filed Vitals:   11/20/14 0902  BP: 130/78  Pulse: 82  Temp: 97.8 F (36.6 C)  TempSrc: Oral  Height: 5\' 5"  (1.651 m)  Weight: 158 lb 12.8 oz (72.031 kg)  SpO2: 97%    Gen: Pleasant, well-nourished, in no distress,  normal affect  ENT: No lesions,  mouth clear,  oropharynx clear, +++ postnasal drip  Neck: No JVD, no TMG, no carotid bruits  Lungs: No use of accessory muscles, no dullness to percussion, exp wheezes  Cardiovascular: RRR, heart sounds normal, no murmur or gallops, no peripheral edema  Abdomen: soft and NT, no HSM,  BS normal  Musculoskeletal: No deformities, no cyanosis or clubbing  Neuro: alert, non focal  Skin: Warm, no lesions or rashes  No results found.  Assessment & Plan:  I personally reviewed all images and lab data in the [facility] system as well as any outside material available during this office visit and agree with the  radiology impressions.   Severe  persistent asthma Severe persistent asthma with ongoing airway inflammation with weather related environmental issues Plan  Depomedrol 120mg  IM  No other changes Eosinophil count ??nucala candidate    [name] was seen today for follow-up.  Diagnoses and all orders for this visit:  Asthma, severe persistent, with acute exacerbation Orders: -     Eosinophil count; Future -     methylPREDNISolone acetate (DEPO-MEDROL) injection 120 mg; Inject 1.5 mLs (120 mg total) into the muscle once.  Severe persistent asthma, with acute exacerbation    I had an extended discussion with the patient and or family lasting 10 minutes of a 25 minute visit including:  Diagnostic options, treatment options

## 2014-11-20 NOTE — Assessment & Plan Note (Signed)
Severe persistent asthma with ongoing airway inflammation with weather related environmental issues Plan  Depomedrol 120mg  IM  No other changes Eosinophil count ??nucala candidate

## 2014-11-21 ENCOUNTER — Telehealth: Payer: Self-pay | Admitting: Critical Care Medicine

## 2014-11-21 DIAGNOSIS — M5090 Cervical disc disorder, unspecified, unspecified cervical region: Secondary | ICD-10-CM | POA: Diagnosis not present

## 2014-11-21 NOTE — Telephone Encounter (Signed)
#   vials:4 Ordered date:11/21/14 Shipping Date:11/22/14

## 2014-11-22 NOTE — Telephone Encounter (Signed)
#   Vials:4 Arrival Date:11/22/14 Lot #:[id number] Exp Date:1/20

## 2014-11-30 ENCOUNTER — Ambulatory Visit: Payer: Medicare Other

## 2014-11-30 NOTE — Progress Notes (Signed)
Quick Note:  Spoke with pt. Advised Nucala will replace xolair. She verbalized understanding and will sign Nucala paperwork tomorrow morning. Will hold in my box to ensure paperwork is received. ______

## 2014-12-01 ENCOUNTER — Ambulatory Visit (INDEPENDENT_AMBULATORY_CARE_PROVIDER_SITE_OTHER): Payer: Medicare Other

## 2014-12-01 DIAGNOSIS — J4550 Severe persistent asthma, uncomplicated: Secondary | ICD-10-CM

## 2014-12-04 ENCOUNTER — Telehealth: Payer: Self-pay | Admitting: Critical Care Medicine

## 2014-12-04 MED ORDER — OMALIZUMAB 150 MG ~~LOC~~ SOLR
300.0000 mg | Freq: Once | SUBCUTANEOUS | Status: AC
  Administered 2014-12-01: 300 mg via SUBCUTANEOUS

## 2014-12-04 NOTE — Telephone Encounter (Signed)
Called pt and she is requesting to speak with [name] regarding ? New injection form? Please advise thanks

## 2014-12-05 ENCOUNTER — Other Ambulatory Visit: Payer: Self-pay

## 2014-12-05 MED ORDER — TELMISARTAN 40 MG PO TABS: 20.0000 mg | ORAL_TABLET | Freq: Every day | ORAL | Status: DC

## 2014-12-05 NOTE — Telephone Encounter (Signed)
Spoke with patient-she is wanted to let me know the financial side of Nucala information; I have placed information on form and patient aware that I will go from here and get the information sent out. Nothing more needed at this time.

## 2014-12-07 NOTE — Progress Notes (Signed)
Quick Note:  Pt signed Nucala forms. The process has started for this -- see phone msg from 12/06/14. Will sign off as Katie in allergy is working on this. ______

## 2014-12-08 DIAGNOSIS — H1851 Endothelial corneal dystrophy: Secondary | ICD-10-CM | POA: Diagnosis not present

## 2014-12-08 DIAGNOSIS — Z961 Presence of intraocular lens: Secondary | ICD-10-CM | POA: Diagnosis not present

## 2014-12-13 ENCOUNTER — Other Ambulatory Visit: Payer: Self-pay | Admitting: Nurse Practitioner

## 2014-12-13 MED ORDER — TELMISARTAN 20 MG PO TABS: 20.0000 mg | ORAL_TABLET | Freq: Every day | ORAL | Status: DC

## 2014-12-15 ENCOUNTER — Ambulatory Visit (INDEPENDENT_AMBULATORY_CARE_PROVIDER_SITE_OTHER): Payer: Medicare Other

## 2014-12-15 DIAGNOSIS — J4540 Moderate persistent asthma, uncomplicated: Secondary | ICD-10-CM | POA: Diagnosis not present

## 2014-12-15 MED ORDER — OMALIZUMAB 150 MG ~~LOC~~ SOLR
300.0000 mg | Freq: Once | SUBCUTANEOUS | Status: AC
  Administered 2014-12-15: 300 mg via SUBCUTANEOUS

## 2014-12-19 ENCOUNTER — Telehealth: Payer: Self-pay | Admitting: Critical Care Medicine

## 2014-12-19 NOTE — Telephone Encounter (Signed)
#   vials:4 Ordered date:12/19/14 Shipping Date:12/20/14 

## 2014-12-20 NOTE — Telephone Encounter (Signed)
#   Vials:4 Arrival Date:12/20/14 Lot #:[id number] Exp Date:2/20  

## 2014-12-29 IMAGING — CT CT ANGIO CHEST
2 of 6 series · 18 of 36 positions shown · IV contrast (Omnipaque 300)
Comparison: 06/26/2009

CLINICAL DATA: Acute shortness of breath.  History of DVT.

EXAM:
CT ANGIOGRAPHY CHEST WITH CONTRAST
TECHNIQUE: Multidetector CT imaging of the chest was performed using the
standard protocol during bolus administration of intravenous
contrast. Multiplanar CT image reconstructions and MIPs were
obtained to evaluate the vascular anatomy.
CONTRAST:  65mL OMNIPAQUE IOHEXOL 350 MG/ML SOLN

[Series 5: thins (id) / (id) · axial · 0.61mm/px · z∈[-271,-55]mm · 17 of 240 slices shown]
[im 12/240  lung]
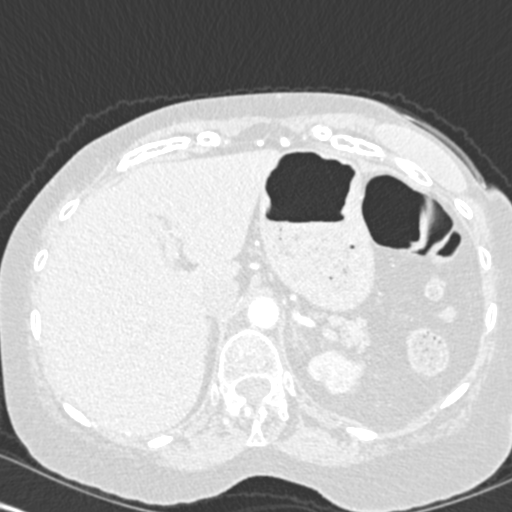
[im 24/240  mediastinal]
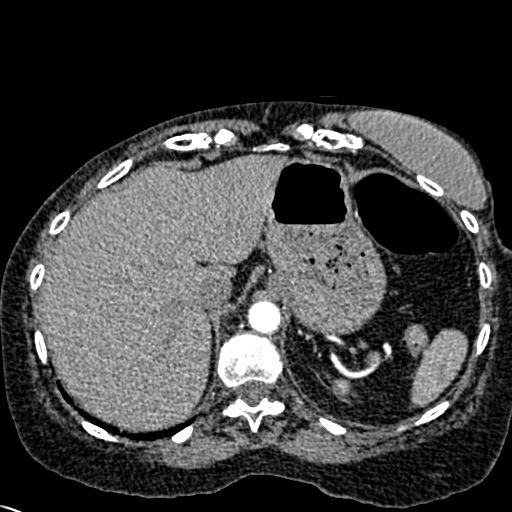
[im 36/240  lung]
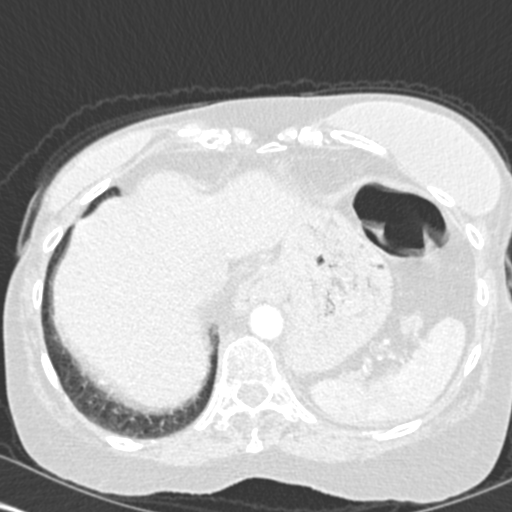
[im 48/240  mediastinal]
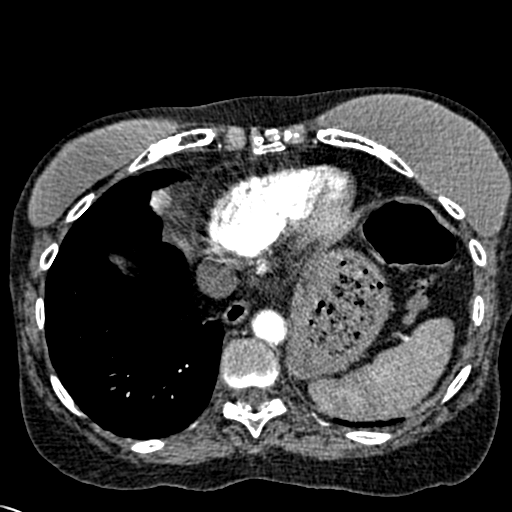
[im 72/240  lung]
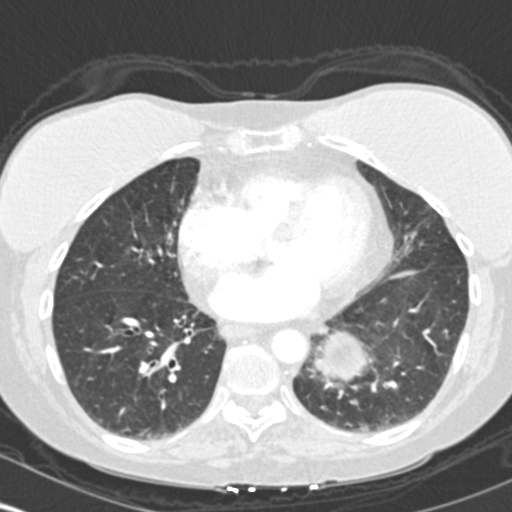
[im 84/240  mediastinal]
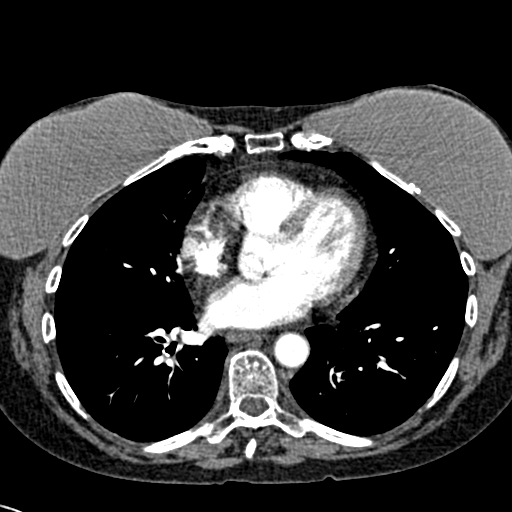
[im 96/240  lung]
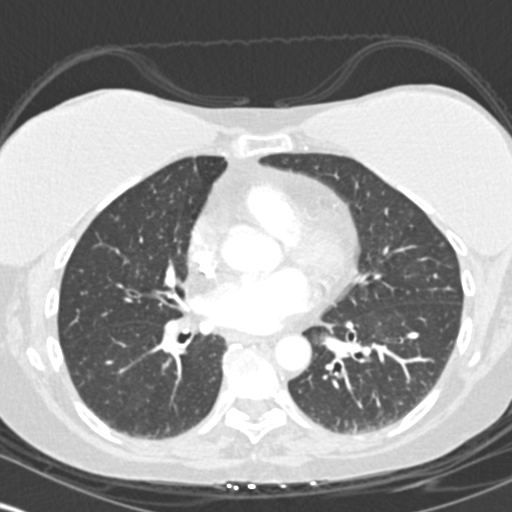
[im 108/240  mediastinal]
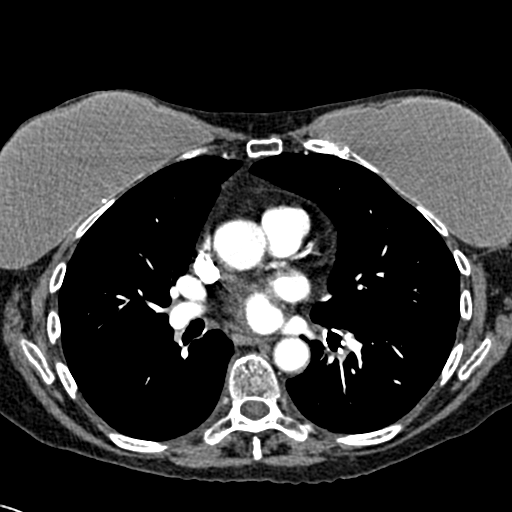
[im 120/240  lung]
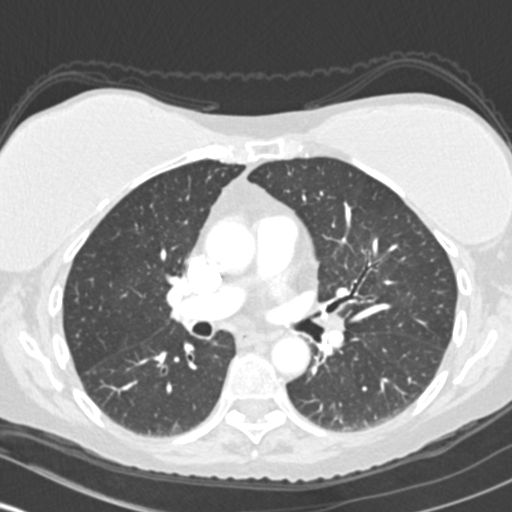
[im 132/240  mediastinal]
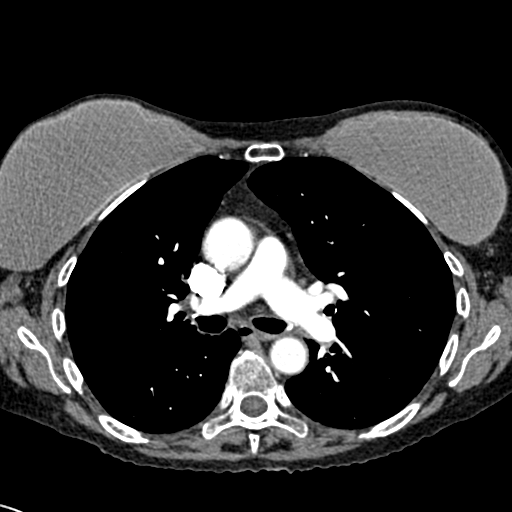
[im 144/240  lung]
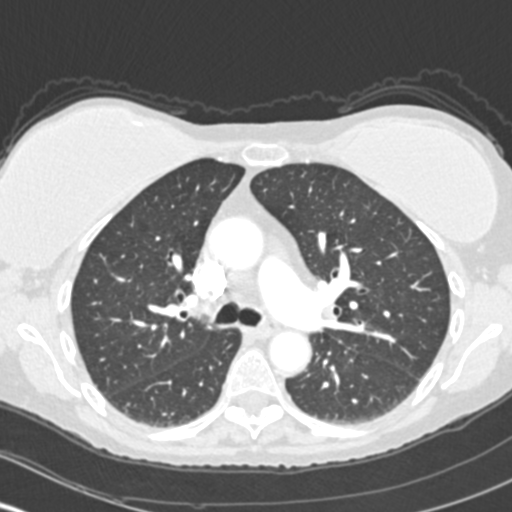
[im 156/240  mediastinal]
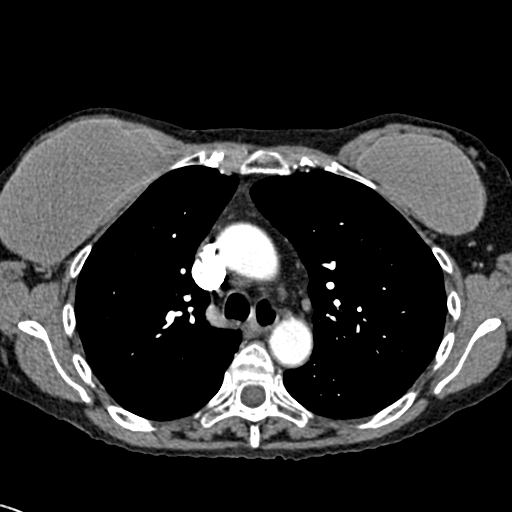
[im 168/240  lung]
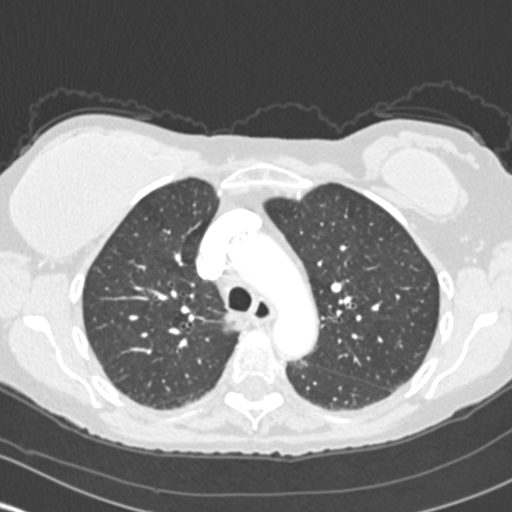
[im 192/240  mediastinal]
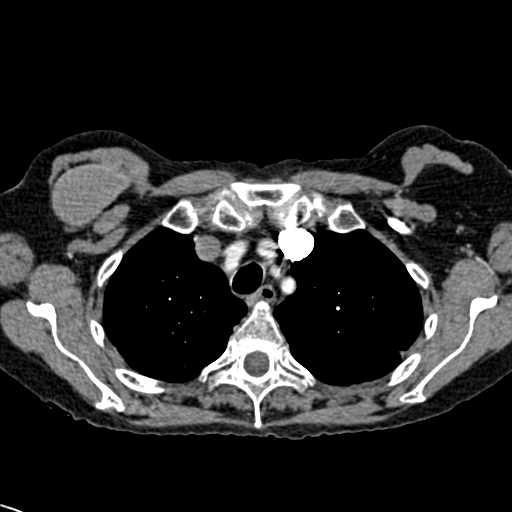
[im 204/240  lung]
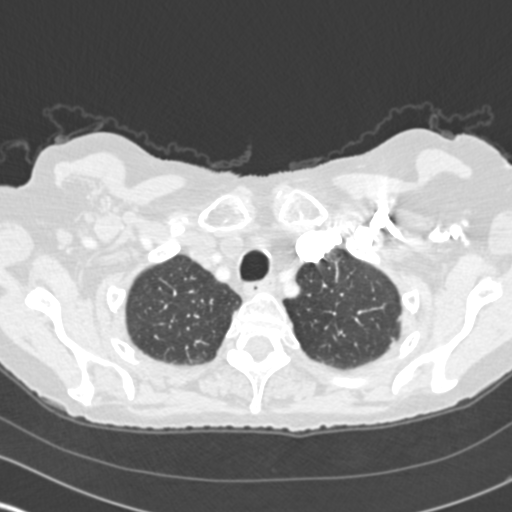
[im 216/240  mediastinal]
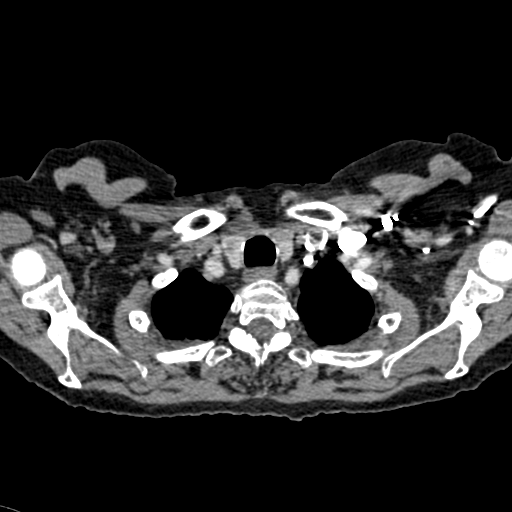
[im 228/240  lung]
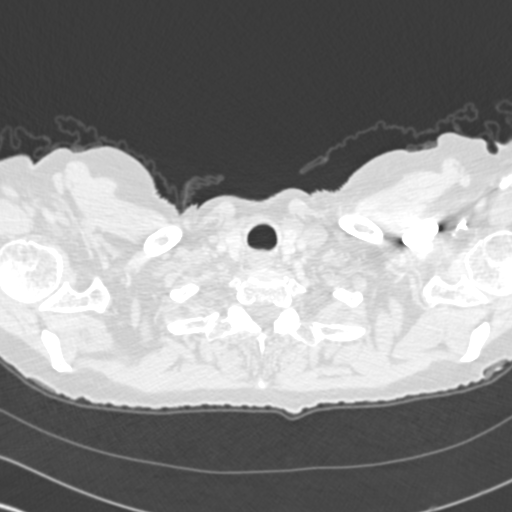

[Series 602: cor mpr · coronal · 0.61mm/px · 1 of 97 slices shown]
[im 49/97  mediastinal]
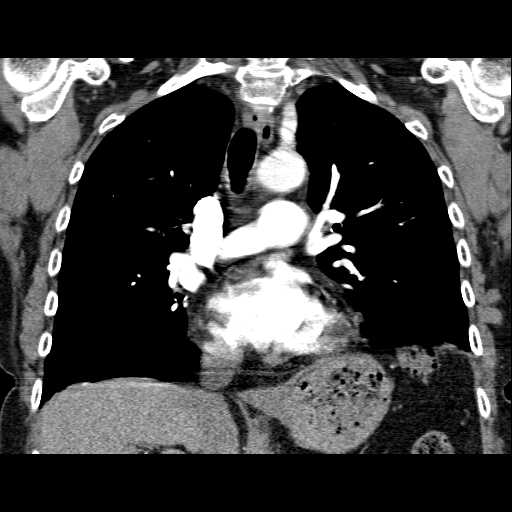

[18 of 36 positions shown; findings below may reference images not displayed]

FINDINGS: Chest wall: Bilateral breast prosthesis are noted status post
bilateral mastectomies. No chest wall mass, supraclavicular or
axillary lymphadenopathy. The bony thorax is intact. No destructive
bone lesions or spinal canal compromise.

Mediastinum: The heart is normal in size. No pericardial effusion.
No mediastinal or hilar mass or lymphadenopathy. Small scattered
lymph nodes are noted. Small bilateral thyroid nodules are noted
with a simple appearing cyst on the right. The aorta is normal in
caliber. No dissection. Coronary artery calcifications are noted.
The esophagus is grossly normal.

The pulmonary arterial tree is well opacified. No filling defects to
suggest pulmonary emboli.

Lungs: No acute pulmonary findings. No infiltrates, effusions or
edema. Minimal basilar atelectasis and lung base breathing motion
artifact. No pleural effusion.

In the left upper lobe There is a 7.5 x 6.5 mm peripheral pulmonary
nodule on image 17. This is slightly larger when compared to prior
CTs from 0005 and 9911. It also has a slightly more spiculated
appearance Tiger and has in the past. Could not exclude the
possibility of a slow-growing adenocarcinoma. I do not think a PET
scan would be helpful at this time due to its small size and very
slow growth. Other tiny scattered nodules are difficult to see on
the prior chest CT.

Upper abdomen:  No significant findings.

Review of the MIP images confirms the above findings.
IMPRESSION: 1. No CT findings for pulmonary embolism.
2. Normal thoracic aorta.
3. No mediastinal or hilar mass or adenopathy.
4. Stable thyroid nodules.
5. Slowly and minimally enlarging left upper lobe pulmonary nodule
could be a very slow growing adenocarcinoma. Recommend a six-month
follow-up chest CT without contrast. Other tiny nodules are
difficult to see on the prior study.
6. Status post bilateral mastectomies. No chest wall tumor or
adenopathy.

## 2015-01-01 ENCOUNTER — Ambulatory Visit (INDEPENDENT_AMBULATORY_CARE_PROVIDER_SITE_OTHER): Payer: Medicare Other | Admitting: Critical Care Medicine

## 2015-01-01 ENCOUNTER — Ambulatory Visit (INDEPENDENT_AMBULATORY_CARE_PROVIDER_SITE_OTHER): Payer: Medicare Other

## 2015-01-01 ENCOUNTER — Encounter: Payer: Self-pay | Admitting: Critical Care Medicine

## 2015-01-01 VITALS — BP 128/70 | HR 76 | Temp 97.9°F | Ht 65.0 in | Wt 162.8 lb

## 2015-01-01 DIAGNOSIS — J4550 Severe persistent asthma, uncomplicated: Secondary | ICD-10-CM

## 2015-01-01 MED ORDER — OMALIZUMAB 150 MG ~~LOC~~ SOLR
300.0000 mg | Freq: Once | SUBCUTANEOUS | Status: AC
  Administered 2015-01-01: 300 mg via SUBCUTANEOUS

## 2015-01-01 NOTE — Patient Instructions (Addendum)
No change in medications. We will check into the nucala process  Return in         4 months

## 2015-01-01 NOTE — Progress Notes (Signed)
Subjective:    Patient ID: [name], female    DOB: [birth year], 81 y.o.   MRN: [id number]  HPI 01/01/2015 Chief Complaint  Patient presents with  . 6 wk follow up    SOB and congestion has improved.  PND and cough is unchanged - prod at times with clear mucus.      Cough comes and goes.  Pt notes less congestion from before.  Postnasal drip still present. Pt denies any significant sore throat, nasal congestion or excess secretions, fever, chills, sweats, unintended weight loss, pleurtic or exertional chest pain, orthopnea PND, or leg swelling Pt denies any increase in rescue therapy over baseline, denies waking up needing it or having any early am or nocturnal exacerbations of coughing/wheezing/or dyspnea. Pt also denies any obvious fluctuation in symptoms with  weather or environmental change or other alleviating or aggravating factors  Fall Risk  01/01/2015  Falls in the past year? No   Depression screen PHQ 2/9 01/01/2015  Decreased Interest 0  Down, Depressed, Hopeless 0  PHQ - 2 Score 0     Current Medications, Allergies, Complete Past Medical History, Past Surgical History, Family History, and Social History were reviewed in [place] record per todays encounter:  01/01/2015  Review of Systems  Constitutional: Negative.   HENT: Positive for postnasal drip and sneezing. Negative for ear pain, rhinorrhea, sinus pressure, sore throat, trouble swallowing and voice change.   Eyes: Negative.   Respiratory: Positive for cough. Negative for apnea, choking, chest tightness, shortness of breath, wheezing and stridor.   Cardiovascular: Negative.  Negative for chest pain, palpitations and leg swelling.  Gastrointestinal: Negative.  Negative for nausea, vomiting, abdominal pain and abdominal distention.  Genitourinary: Negative.   Musculoskeletal: Negative.  Negative for myalgias and arthralgias.  Skin: Negative.  Negative for rash.  Allergic/Immunologic:  Negative.  Negative for environmental allergies and food allergies.  Neurological: Negative.  Negative for dizziness, syncope, weakness and headaches.  Hematological: Negative.  Negative for adenopathy. Does not bruise/bleed easily.  Psychiatric/Behavioral: Negative.  Negative for sleep disturbance and agitation. The patient is not nervous/anxious.        Objective:   Physical Exam Filed Vitals:   01/01/15 1030  BP: 128/70  Pulse: 76  Temp: 97.9 F (36.6 C)  TempSrc: Oral  Height: 5\' 5"  (1.651 m)  Weight: 162 lb 12.8 oz (73.846 kg)  SpO2: 99%    Gen: Pleasant, well-nourished, in no distress,  normal affect  ENT: No lesions,  mouth clear,  oropharynx clear, no postnasal drip  Neck: No JVD, no TMG, no carotid bruits  Lungs: No use of accessory muscles, no dullness to percussion, clear without rales or rhonchi  Cardiovascular: RRR, heart sounds normal, no murmur or gallops, no peripheral edema  Abdomen: soft and NT, no HSM,  BS normal  Musculoskeletal: No deformities, no cyanosis or clubbing  Neuro: alert, non focal  Skin: Warm, no lesions or rashes  No results found.        Assessment & Plan:  I personally reviewed all images and lab data in the [facility] system as well as any outside material available during this office visit and agree with the  radiology impressions.   Severe persistent asthma  Severe persistent asthma with Xolair failure  Significant atopic features  Significantly high eosinophil levels  Plan  Attempt to get nucala for this patient,  At which time Xolair will be discontinued,  Maintain Xolair until  Medication can be obtained  Continued current maintenance inhalers and add histamines for now   [name] was seen today for 6 wk follow up.  Diagnoses and all orders for this visit:  Severe persistent asthma, uncomplicated

## 2015-01-02 NOTE — Assessment & Plan Note (Signed)
Severe persistent asthma with Xolair failure  Significant atopic features  Significantly high eosinophil levels  Plan  Attempt to get nucala for this patient,  At which time Xolair will be discontinued,  Maintain Xolair until  Medication can be obtained  Continued current maintenance inhalers and add histamines for now

## 2015-01-16 ENCOUNTER — Ambulatory Visit (INDEPENDENT_AMBULATORY_CARE_PROVIDER_SITE_OTHER): Payer: Medicare Other

## 2015-01-16 DIAGNOSIS — J4550 Severe persistent asthma, uncomplicated: Secondary | ICD-10-CM

## 2015-01-17 ENCOUNTER — Ambulatory Visit: Payer: Medicare Other

## 2015-01-18 ENCOUNTER — Other Ambulatory Visit: Payer: Self-pay | Admitting: Cardiovascular Disease

## 2015-01-22 MED ORDER — OMALIZUMAB 150 MG ~~LOC~~ SOLR
300.0000 mg | Freq: Once | SUBCUTANEOUS | Status: AC
  Administered 2015-01-16: 300 mg via SUBCUTANEOUS

## 2015-01-25 ENCOUNTER — Telehealth: Payer: Self-pay | Admitting: Critical Care Medicine

## 2015-01-26 NOTE — Telephone Encounter (Signed)
#   Vials:4 Arrival Date:01-26-15 Lot #:[id number] Exp Date:09-2018   

## 2015-01-26 NOTE — Telephone Encounter (Signed)
#   vials:4 Ordered date:01-25-15 Shipping Date:01-26-15  

## 2015-01-31 ENCOUNTER — Ambulatory Visit (INDEPENDENT_AMBULATORY_CARE_PROVIDER_SITE_OTHER): Payer: Medicare Other

## 2015-01-31 DIAGNOSIS — R7301 Impaired fasting glucose: Secondary | ICD-10-CM | POA: Diagnosis not present

## 2015-01-31 DIAGNOSIS — J4540 Moderate persistent asthma, uncomplicated: Secondary | ICD-10-CM

## 2015-01-31 DIAGNOSIS — M25559 Pain in unspecified hip: Secondary | ICD-10-CM | POA: Diagnosis not present

## 2015-01-31 DIAGNOSIS — E039 Hypothyroidism, unspecified: Secondary | ICD-10-CM | POA: Diagnosis not present

## 2015-01-31 DIAGNOSIS — R5381 Other malaise: Secondary | ICD-10-CM | POA: Diagnosis not present

## 2015-01-31 DIAGNOSIS — I10 Essential (primary) hypertension: Secondary | ICD-10-CM | POA: Diagnosis not present

## 2015-01-31 DIAGNOSIS — M169 Osteoarthritis of hip, unspecified: Secondary | ICD-10-CM | POA: Diagnosis not present

## 2015-01-31 DIAGNOSIS — Z6827 Body mass index (BMI) 27.0-27.9, adult: Secondary | ICD-10-CM | POA: Diagnosis not present

## 2015-01-31 MED ORDER — OMALIZUMAB 150 MG ~~LOC~~ SOLR
300.0000 mg | Freq: Once | SUBCUTANEOUS | Status: AC
  Administered 2015-01-31: 300 mg via SUBCUTANEOUS

## 2015-02-09 DIAGNOSIS — M7062 Trochanteric bursitis, left hip: Secondary | ICD-10-CM | POA: Diagnosis not present

## 2015-02-14 ENCOUNTER — Ambulatory Visit (INDEPENDENT_AMBULATORY_CARE_PROVIDER_SITE_OTHER): Payer: Medicare Other

## 2015-02-14 DIAGNOSIS — J4540 Moderate persistent asthma, uncomplicated: Secondary | ICD-10-CM | POA: Diagnosis not present

## 2015-02-14 MED ORDER — OMALIZUMAB 150 MG ~~LOC~~ SOLR
300.0000 mg | Freq: Once | SUBCUTANEOUS | Status: AC
  Administered 2015-02-14: 300 mg via SUBCUTANEOUS

## 2015-02-15 DIAGNOSIS — I10 Essential (primary) hypertension: Secondary | ICD-10-CM | POA: Diagnosis not present

## 2015-02-16 ENCOUNTER — Telehealth: Payer: Self-pay | Admitting: Pulmonary Disease

## 2015-02-16 NOTE — Telephone Encounter (Signed)
Pt made aware of approval and that she can start first Nucala injection on 02-28-15. Speciality pharmacy will be [facility]. Spoke with [facility] pharmacy to confirm Rx;pt needed to be added to their database-after giving information to [facility]-phone hung up/disconnected. I then contacted [facility] [place] for [name] to [facility] Rx for [employer] was "triaged" to [employer]. [name] helped with this matter as [name] was assisting another office. [name] will have [name] call me back to give updates. [name] was made aware that pt is set for her first Nucala injection on 02-28-15.    Also, information faxed to [facility] to [place] updating MD for patient [name]) since PW is no longer with the practice.

## 2015-02-19 ENCOUNTER — Telehealth: Payer: Self-pay | Admitting: Critical Care Medicine

## 2015-02-19 ENCOUNTER — Other Ambulatory Visit: Payer: Self-pay | Admitting: Critical Care Medicine

## 2015-02-19 MED ORDER — EPINEPHRINE 0.3 MG/0.3ML IJ SOAJ: 0.3000 mg | Freq: Once | INTRAMUSCULAR | Status: DC

## 2015-02-19 NOTE — Telephone Encounter (Signed)
lmtcb for pt.  

## 2015-02-19 NOTE — Telephone Encounter (Signed)
Pt returning call.[name] ° °

## 2015-02-19 NOTE — Telephone Encounter (Signed)
Spoke with pt. Advised her that I would send in a prescription for her Epi-pen. Rx has been sent in. Nothing further was needed.

## 2015-02-21 ENCOUNTER — Encounter: Payer: Self-pay | Admitting: Internal Medicine

## 2015-02-21 ENCOUNTER — Ambulatory Visit (INDEPENDENT_AMBULATORY_CARE_PROVIDER_SITE_OTHER): Payer: Medicare Other | Admitting: Internal Medicine

## 2015-02-21 VITALS — BP 154/76 | HR 86 | Temp 98.2°F | Ht 65.0 in | Wt 160.0 lb

## 2015-02-21 DIAGNOSIS — J45901 Unspecified asthma with (acute) exacerbation: Secondary | ICD-10-CM | POA: Insufficient documentation

## 2015-02-21 DIAGNOSIS — J4541 Moderate persistent asthma with (acute) exacerbation: Secondary | ICD-10-CM | POA: Diagnosis not present

## 2015-02-21 MED ORDER — METHYLPREDNISOLONE 8 MG PO TABS: ORAL_TABLET | ORAL | Status: DC

## 2015-02-21 MED ORDER — LEVALBUTEROL HCL 0.63 MG/3ML IN NEBU
0.6300 mg | INHALATION_SOLUTION | Freq: Once | RESPIRATORY_TRACT | Status: AC
  Administered 2015-02-21: 0.63 mg via RESPIRATORY_TRACT

## 2015-02-21 MED ORDER — METHYLPREDNISOLONE ACETATE 80 MG/ML IJ SUSP
80.0000 mg | Freq: Once | INTRAMUSCULAR | Status: AC
  Administered 2015-02-21: 80 mg via INTRAMUSCULAR

## 2015-02-21 MED ORDER — AZITHROMYCIN 250 MG PO TABS: ORAL_TABLET | ORAL | Status: DC

## 2015-02-21 NOTE — Patient Instructions (Addendum)
ICD-9-CM ICD-10-CM   1. Asthma with acute exacerbation, moderate persistent 493.92 J45.41     Xopenex neb in office Depot medrol 80mg  IM x  1 in office Zpak to take home - has worked in past Please take methylprednisolone tablet starting 02/22/15  -  32 mg x1 day, then 24 mg x1 day, then 16 mg x1 day, then 12 mg x1 day, and then stop   - please note: risk of side effect profile likely same and not guaranteed you wont get side efffect  Continue scheduled meds Hold Nucala till further notice and followup  Followup  = ER if worse  - Otherwise, Dr Ashok Cordia < 4 week

## 2015-02-21 NOTE — Addendum Note (Signed)
Addended by: Maurice March on: 02/21/2015 05:15 PM   Modules accepted: Orders

## 2015-02-21 NOTE — Progress Notes (Signed)
Subjective:     Patient ID: [name], female   DOB: [birth year], 81 y.o.   MRN: [id number]  HPI    OV 02/21/2015  Chief Complaint  Patient presents with  . Acute Visit    Former PW pt. Pt states she is scheduled to start Nucala next week. Pt c/o cough with little mucus porduction - clear in color, PND, slight increase in SOB, constant chest tightness x 3 days. Pt denies f/c/s and swelling.    Acute visit for a follow Dr. Asencion Noble patient. Chart review shows that she has significant asthma with eosinophilia. She is being switched from [place] which she is off to [place] (Mepoluzimab) due to peripheral eosinophilia. She is yet to start this injection but is due to started in few to several days from now. At baseline she tells me that she occasionally wakes up at night because of asthma and uses albuterol 1 or 2 times a week and generally leads a good quality of life with her inhaler therapy. She also tells me that at baseline she is "allergic" to prednisone tablets. This makes her face swell up and turned red but she's never developed a rash anaphylaxis with it. Therefore her asthma exacerbations are typically treated with a single shot of IM Depo-Medrol and a course of antibiotics.Baseline medications listed on Symbicort, Singulair and nasal steroid but she tells me that she is not taking her Singulair  Currently for the last 3-4 days is having increased cough, sore throat, chest tightness, wheezing. It is associated with increased albuterol use at least 1 or 2 times a day. Associated with nocturnal awakening daily at night. Symptoms are relieved by albuterol but only transiently. Maintenance inhalers are not working. She feels she is picked up a viral infection. Symptoms are rated as moderate and is progressive/stable.  Chart review 10/10/2011 spirometry shows normal spirometry  CT chest November2015 in March 2016: No reports of emphysema seen on CT chest or interstitial lung  disease   Off note she is going to register care with Dr. Tera Partridge in our practice  FeNO in our oifice is 47ppb and ELEVATED    reports that she has never smoked. She has never used smokeless tobacco.    Immunization History  Administered Date(s) Administered  . Pneumococcal Conjugate-13 09/14/2012  . Pneumococcal Polysaccharide-23 01/31/2009     Current outpatient prescriptions:  .  albuterol (PROAIR HFA) 108 (90 BASE) MCG/ACT inhaler, Inhale 2 puffs into the lungs every 6 (six) hours as needed for wheezing or shortness of breath., Disp: 18 g, Rfl: 4 .  ALPRAZolam (XANAX) 0.5 MG tablet, Take 0.5 mg by mouth 2 (two) times daily as needed for anxiety. , Disp: , Rfl:  .  amLODipine (NORVASC) 5 MG tablet, Take 5 mg by mouth daily., Disp: , Rfl: 11 .  azelastine (ASTELIN) 0.1 % nasal spray, USE 2 SPRAYS IN EACH NOSTRIL TWICE A DAY AS DIRECTED, Disp: 3 mL, Rfl: 2 .  benzonatate (TESSALON) 100 MG capsule, Take 100 mg by mouth 3 (three) times daily as needed for cough., Disp: , Rfl:  .  Calcium Carbonate-Vitamin D (CALTRATE 600+D) 600-400 MG-UNIT per tablet, Take 1 tablet by mouth daily. , Disp: , Rfl:  .  chlorpheniramine (CHLOR-TRIMETON) 4 MG tablet, Take by mouth. 4 mg in the morning and 8 mg at bedtime, Disp: , Rfl:  .  Cholecalciferol (VITAMIN D3) 1000 UNITS tablet, Take 1,000 Units by mouth daily.  , Disp: , Rfl:  .  dextromethorphan (  DELSYM) 30 MG/5ML liquid, Take 60 mg by mouth 2 (two) times daily as needed for cough., Disp: , Rfl:  .  diclofenac sodium (VOLTAREN) 1 % GEL, Apply topically as needed., Disp: , Rfl:  .  EPINEPHrine 0.3 mg/0.3 mL IJ SOAJ injection, Inject 0.3 mLs (0.3 mg total) into the muscle once., Disp: 2 Device, Rfl: 1 .  esomeprazole (NEXIUM) 40 MG capsule, TAKE 1 CAPSULE TWICE A DAY, Disp: 180 capsule, Rfl: 3 .  fexofenadine (ALLEGRA) 180 MG tablet, Take 180 mg by mouth daily.  , Disp: , Rfl:  .  furosemide (LASIX) 40 MG tablet, TAKE ONE-HALF (1/2) TABLET  (20 MG TOTAL) EVERY MORNING, Disp: 45 tablet, Rfl: 3 .  mometasone (NASONEX) 50 MCG/ACT nasal spray, USE 2 SPRAYS NASALLY TWICE A DAY, Disp: 51 g, Rfl: 4 .  montelukast (SINGULAIR) 10 MG tablet, TAKE 1 TABLET AT BEDTIME, Disp: 90 tablet, Rfl: 3 .  Multiple Vitamin (MULTIVITAMIN) tablet, Take 1 tablet by mouth daily., Disp: , Rfl:  .  potassium chloride (K-DUR) 10 MEQ tablet, TAKE 1 TABLET DAILY, Disp: 90 tablet, Rfl: 2 .  pramipexole (MIRAPEX) 0.125 MG tablet, Take 0.375 mg by mouth at bedtime. , Disp: , Rfl:  .  Rivaroxaban (XARELTO) 15 MG TABS tablet, Take 15 mg by mouth every other day., Disp: , Rfl:  .  rosuvastatin (CRESTOR) 5 MG tablet, TAKE 1 TABLET AT BEDTIME, Disp: 90 tablet, Rfl: 2 .  Spacer/Aero-Holding Chambers (AEROCHAMBER MV) inhaler, by Other route. Use as instructed , Disp: , Rfl:  .  SYMBICORT 160-4.5 MCG/ACT inhaler, INHALE 2 PUFFS INTO THE LUNGS TWICE A DAY, Disp: 3 Inhaler, Rfl: 3 .  telmisartan (MICARDIS) 20 MG tablet, Take 1 tablet (20 mg total) by mouth at bedtime., Disp: 90 tablet, Rfl: 3 .  traMADol (ULTRAM) 50 MG tablet, Take 1 tablet by mouth as needed. , Disp: , Rfl:  .  triamcinolone cream (KENALOG) 0.5 %, Apply 1 application topically 3 (three) times daily as needed., Disp: , Rfl:  .  trolamine salicylate (ASPERCREME) 10 % cream, Apply 1 application topically 2 (two) times daily as needed (pain). , Disp: , Rfl:  .  vitamin B-12 (CYANOCOBALAMIN) 500 MCG tablet, Take 500 mcg by mouth daily.  , Disp: , Rfl:  .  XARELTO 20 MG TABS tablet, Take 20 mg by mouth every other day. , Disp: , Rfl:  .  albuterol (PROVENTIL) (2.5 MG/3ML) 0.083% nebulizer solution, Take 3 mLs (2.5 mg total) by nebulization every 6 (six) hours as needed for wheezing or shortness of breath. (Patient not taking: Reported on 02/21/2015), Disp: 75 mL, Rfl: 12 .  clindamycin (CLEOCIN) 300 MG capsule, Take 300 mg by mouth. Before dental work, Disp: , Rfl:   Review of Systems  As per history of present  illness otherwise negative     Objective:   Physical Exam  Constitutional: She is oriented to person, place, and time. She appears well-developed and well-nourished. No distress.  Sounds hoarse looks a bit tired   HENT:  Head: Normocephalic and atraumatic.  Right Ear: External ear normal.  Left Ear: External ear normal.  Mouth/Throat: Oropharynx is clear and moist. No oropharyngeal exudate.  Eyes: Conjunctivae and EOM are normal. Pupils are equal, round, and reactive to light. Right eye exhibits no discharge. Left eye exhibits no discharge. No scleral icterus.  Neck: Normal range of motion. Neck supple. No JVD present. No tracheal deviation present. No thyromegaly present.  Cardiovascular: Normal rate, regular rhythm, normal heart  sounds and intact distal pulses.  Exam reveals no gallop and no friction rub.   No murmur heard. Pulmonary/Chest: Effort normal and breath sounds normal. No respiratory distress. She has no wheezes. She has no rales. She exhibits no tenderness.  Coughs occasionally No wheeze  Abdominal: Soft. Bowel sounds are normal. She exhibits no distension and no mass. There is no tenderness. There is no rebound and no guarding.  Musculoskeletal: Normal range of motion. She exhibits no edema or tenderness.  Lymphadenopathy:    She has no cervical adenopathy.  Neurological: She is alert and oriented to person, place, and time. She has normal reflexes. No cranial nerve deficit. She exhibits normal muscle tone. Coordination normal.  Skin: Skin is warm and dry. No rash noted. She is not diaphoretic. No erythema. No pallor.  Psychiatric: She has a normal mood and affect. Her behavior is normal. Judgment and thought content normal.  Vitals reviewed.   Filed Vitals:   02/21/15 1410  BP: 154/76  Pulse: 86  Temp: 98.2 F (36.8 C)  TempSrc: Oral  Height: 5\' 5"  (1.651 m)  Weight: 160 lb (72.576 kg)  SpO2: 96%   Allergies  Allergen Reactions  . Eggs Or Egg-Derived  Products     RAW EGGS.. Can eat cooked eggs  . Influenza Vaccines   . Cephalosporins Rash  . Codeine Rash  . Levofloxacin Rash  . Penicillins Rash  . Pneumococcal Vaccines Rash        Assessment:       ICD-9-CM ICD-10-CM   1. Asthma with acute exacerbation, moderate persistent 493.92 J45.41    FeNO elevated - despite symbicort. Has acute symptoms     Plan:      Xopenex neb in office [place] medrol 80mg  IM x  1 in office Zpak to take home - has worked in past Please take methylprednisolone tablet starting 02/22/15  -  32 mg x1 day, then 24 mg x1 day, then 16 mg x1 day, then 12 mg x1 day, and then stop   - please note: risk of side effect profile likely same and not guaranteed you wont get side efffect   - based on discusion she is willing and wants to try medrol instead of prednisone  Continue scheduled meds Hold Nucala till further notice and followup  Followup  = ER if worse  - Otherwise, Dr Ashok Cordia < 4 week    Dr. Brand Males, M.D., [facility].C.P Pulmonary and Critical Care Medicine Staff Physician [facility] Pager: [phone], If no answer or between  15:00h - 7:00h: call [phone]  02/21/2015 2:51 PM

## 2015-02-21 NOTE — Addendum Note (Signed)
Addended by: Jerrol Banana on: 02/21/2015 02:57 PM   Modules accepted: Orders

## 2015-02-23 DIAGNOSIS — I10 Essential (primary) hypertension: Secondary | ICD-10-CM | POA: Diagnosis not present

## 2015-02-26 ENCOUNTER — Telehealth: Payer: Self-pay | Admitting: Adult Health

## 2015-02-26 MED ORDER — ALBUTEROL SULFATE (2.5 MG/3ML) 0.083% IN NEBU: 2.5000 mg | INHALATION_SOLUTION | Freq: Four times a day (QID) | RESPIRATORY_TRACT | Status: DC | PRN

## 2015-02-26 NOTE — Telephone Encounter (Signed)
Called spoke with pt. She needed her albuterol neb refilled. i have sent this in. nothing further needed

## 2015-02-28 ENCOUNTER — Ambulatory Visit: Payer: Medicare Other

## 2015-03-02 ENCOUNTER — Telehealth: Payer: Self-pay | Admitting: Pulmonary Disease

## 2015-03-02 NOTE — Telephone Encounter (Signed)
Called [facility] and spoke to rep. Page 2 needs to be signed by pt and faxed back.   Will forward to [facility] to follow.

## 2015-03-05 DIAGNOSIS — I82409 Acute embolism and thrombosis of unspecified deep veins of unspecified lower extremity: Secondary | ICD-10-CM | POA: Diagnosis not present

## 2015-03-05 DIAGNOSIS — Z1389 Encounter for screening for other disorder: Secondary | ICD-10-CM | POA: Diagnosis not present

## 2015-03-05 DIAGNOSIS — I10 Essential (primary) hypertension: Secondary | ICD-10-CM | POA: Diagnosis not present

## 2015-03-05 DIAGNOSIS — Z6827 Body mass index (BMI) 27.0-27.9, adult: Secondary | ICD-10-CM | POA: Diagnosis not present

## 2015-03-05 DIAGNOSIS — J45909 Unspecified asthma, uncomplicated: Secondary | ICD-10-CM | POA: Diagnosis not present

## 2015-03-05 DIAGNOSIS — R7301 Impaired fasting glucose: Secondary | ICD-10-CM | POA: Diagnosis not present

## 2015-03-06 NOTE — Telephone Encounter (Signed)
Katie please advise if this has been done.  Thanks!

## 2015-03-08 ENCOUNTER — Encounter: Payer: Self-pay | Admitting: Cardiovascular Disease

## 2015-03-08 ENCOUNTER — Ambulatory Visit (INDEPENDENT_AMBULATORY_CARE_PROVIDER_SITE_OTHER): Payer: Medicare Other | Admitting: Cardiovascular Disease

## 2015-03-08 VITALS — BP 134/80 | HR 71 | Ht 65.0 in | Wt 161.0 lb

## 2015-03-08 DIAGNOSIS — I10 Essential (primary) hypertension: Secondary | ICD-10-CM | POA: Diagnosis not present

## 2015-03-08 DIAGNOSIS — R0609 Other forms of dyspnea: Secondary | ICD-10-CM

## 2015-03-08 DIAGNOSIS — R079 Chest pain, unspecified: Secondary | ICD-10-CM | POA: Diagnosis not present

## 2015-03-08 DIAGNOSIS — E785 Hyperlipidemia, unspecified: Secondary | ICD-10-CM

## 2015-03-08 NOTE — Patient Instructions (Signed)
Medication Instructions:  Your physician recommends that you continue on your current medications as directed. Please refer to the Current Medication list given to you today.   Labwork: None Ordered   Testing/Procedures: Your physician has requested that you have an echocardiogram. Echocardiography is a painless test that uses sound waves to create images of your heart. It provides your doctor with information about the size and shape of your heart and how well your heart's chambers and valves are working. This procedure takes approximately one hour. There are no restrictions for this procedure.  Your physician has requested that you have a lexiscan myoview. For further information please visit [email or web address]. Please follow instruction sheet, as given.   Follow-Up: Your physician wants you to follow-up in: 6 months with Dr. Nahser.  You will receive a reminder letter in the mail two months in advance. If you don't receive a letter, please call our office to schedule the follow-up appointment.   If you need a refill on your cardiac medications before your next appointment, please call your pharmacy.   Thank you for choosing [facility]! Michelle Swinyer, RN [phone]    

## 2015-03-08 NOTE — Progress Notes (Signed)
[name] Date of Birth  04-13-1933       [facility]    [employer] [address], [address], [place], [place]  [address]   [place], [place]  [address] [phone]     [phone]   Fax  [phone]    Fax [phone]  Problem List: 1. History of chest pain-normal coronary arteries by heart catheterization in August, 2008 2. History of breast cancer-status post right mastectomy and 1989-status post left mastectomy in 1990 3. Hypertension 4. Hyperlipidemia 5. Asthma  History of Present Illness:  [name] has had lots of problems with her asthma this summer.  She's not had any cardiac complaints. She denies any chest pain. She's not been able to exercise much of the summer because her asthma problems.  Oct. 23, 2014:  She has had a total knee replacement since I saw her.   She is back exercising some - rehab for her knee.  Has lost a few lbs.   Oct. 27, 2015:  [name] is doing ok from a cardiac standpoint. She's had a wound on the left lower leg which she's been tending to.  She has been going to the wound Center.   [name] history of asthma and has chronic shortness of breath. No angina like pain.    Oct. 27, 2016  Has had lots of breathing issues.  Has had a rough year with her lungs.  Recently had PFTs and was found to her 47% predicted capacity ( on one of the measured functions )  Wakes up with CP on occasion .   Also has some exertional CP .    Current Outpatient Prescriptions on File Prior to Visit  Medication Sig Dispense Refill  . albuterol (PROAIR HFA) 108 (90 BASE) MCG/ACT inhaler Inhale 2 puffs into the lungs every 6 (six) hours as needed for wheezing or shortness of breath. 18 g 4  . albuterol (PROVENTIL) (2.5 MG/3ML) 0.083% nebulizer solution Take 3 mLs (2.5 mg total) by nebulization every 6 (six) hours as needed for wheezing or shortness of breath. Dx: J45.41 360 mL 6  . ALPRAZolam (XANAX) 0.5 MG tablet Take  0.5 mg by mouth 2 (two) times daily as needed for anxiety.     [name] amLODipine (NORVASC) 5 MG tablet Take 5 mg by mouth daily.  11  . azelastine (ASTELIN) 0.1 % nasal spray USE 2 SPRAYS IN EACH NOSTRIL TWICE A DAY AS DIRECTED 3 mL 2  . benzonatate (TESSALON) 100 MG capsule Take 100 mg by mouth 3 (three) times daily as needed for cough.    . Calcium Carbonate-Vitamin D (CALTRATE 600+D) 600-400 MG-UNIT per tablet Take 1 tablet by mouth daily.     . chlorpheniramine (CHLOR-TRIMETON) 4 MG tablet Take by mouth. 4 mg in the morning and 8 mg at bedtime    . Cholecalciferol (VITAMIN D3) 1000 UNITS tablet Take 1,000 Units by mouth daily.      . clindamycin (CLEOCIN) 300 MG capsule Take 300 mg by mouth. Before dental work    . dextromethorphan (DELSYM) 30 MG/5ML liquid Take 60 mg by mouth 2 (two) times daily as needed for cough.    . diclofenac sodium (VOLTAREN) 1 % GEL Apply topically as needed.    [name] EPINEPHrine 0.3 mg/0.3 mL IJ SOAJ injection Inject 0.3 mLs (0.3 mg total) into the muscle once. 2 Device 1  . esomeprazole (NEXIUM) 40 MG capsule TAKE 1 CAPSULE TWICE A DAY 180 capsule  3  . fexofenadine (ALLEGRA) 180 MG tablet Take 180 mg by mouth daily.      . furosemide (LASIX) 40 MG tablet TAKE ONE-HALF (1/2) TABLET (20 MG TOTAL) EVERY MORNING 45 tablet 3  . methylPREDNISolone (MEDROL) 8 MG tablet 32 mg x1 day, then 24 mg x1 day, then 16 mg x1 day, then 12 mg x1 day, and then stop 11 tablet 0  . mometasone (NASONEX) 50 MCG/ACT nasal spray USE 2 SPRAYS NASALLY TWICE A DAY 51 g 4  . montelukast (SINGULAIR) 10 MG tablet TAKE 1 TABLET AT BEDTIME 90 tablet 3  . Multiple Vitamin (MULTIVITAMIN) tablet Take 1 tablet by mouth daily.    . potassium chloride (K-DUR) 10 MEQ tablet TAKE 1 TABLET DAILY (Patient taking differently: TAKE 1 TABLET BY MOUTH  DAILY) 90 tablet 2  . pramipexole (MIRAPEX) 0.125 MG tablet Take 0.375 mg by mouth at bedtime.     . Rivaroxaban (XARELTO) 15 MG TABS tablet Take 15 mg by mouth every  other day.    . rosuvastatin (CRESTOR) 5 MG tablet TAKE 1 TABLET AT BEDTIME 90 tablet 2  . Spacer/Aero-Holding Chambers (AEROCHAMBER MV) inhaler by Other route. Use as instructed     . SYMBICORT 160-4.5 MCG/ACT inhaler INHALE 2 PUFFS INTO THE LUNGS TWICE A DAY 3 Inhaler 3  . telmisartan (MICARDIS) 20 MG tablet Take 1 tablet (20 mg total) by mouth at bedtime. 90 tablet 3  . traMADol (ULTRAM) 50 MG tablet Take 1 tablet by mouth as needed.     . triamcinolone cream (KENALOG) 0.5 % Apply 1 application topically 3 (three) times daily as needed.    . trolamine salicylate (ASPERCREME) 10 % cream Apply 1 application topically 2 (two) times daily as needed (pain).     . vitamin B-12 (CYANOCOBALAMIN) 500 MCG tablet Take 500 mcg by mouth daily.      [name] Reasons 20 MG TABS tablet Take 20 mg by mouth every other day.      No current facility-administered medications on file prior to visit.    Allergies  Allergen Reactions  . Eggs Or Egg-Derived Products     RAW EGGS.. Can eat cooked eggs  . Influenza Vaccines   . Cephalosporins Rash  . Codeine Rash  . Levofloxacin Rash  . Penicillins Rash  . Pneumococcal Vaccines Rash    Past Medical History  Diagnosis Date  . Restless legs syndrome (RLS)   . Osteoporosis, unspecified     Knee and hip osteoarthritis bilaterally  . Unspecified essential hypertension   . Hyperlipidemia   . Perennial allergic rhinitis   . Diverticulosis   . Chronic kidney disease, stage II (mild)   . History of DVT of lower extremity   . History of shingles   . Asthma, severe persistent     pulmologist-- dr Joya Gaskins  . GERD (gastroesophageal reflux disease)   . DDD (degenerative disc disease), cervical   . History of breast cancer     1989  S/P   RIGHT MASTECTOMY ;  NO CHEMORADIATION //   NO RECURRENCE  . Leg ulcer, left ([place])   . Edema, lower extremity     Past Surgical History  Procedure Laterality Date  . Total abdominal hysterectomy w/ bilateral salpingoophorectomy   1989    done at same time as right mastectomy  . Total knee arthroplasty Right 10/05/2012    Procedure: RIGHT TOTAL KNEE ARTHROPLASTY;  Surgeon: [name] Pole, MD;  Location: [facility];  Service: Orthopedics;  Laterality:  Right;  . Cardiac catheterization  08/ 01/ 2008   dr Cathie Olden    normal -- EF of 65%  . Cataract extraction w/ intraocular lens  implant, bilateral    . Cholecystectomy  1993  . Lumbar disc surgery  [facility]  . Mastectomy Bilateral rigth 1989///   left  1990    breast cancer 1989//   fibrocytic disease 1990  . Cardiovascular stress test  05-22-2011   dr Cathie Olden    normal lexiscan perfusion study/  no ischemia/  lvsf  86%  . Incision and drainage of wound Left 10/24/2013    Procedure: IRRIGATION AND DEBRIDEMENT OF LEFT LEG WITH PLACEMENT OF ACELL AND WOUND VAC;  Surgeon: Theodoro Kos, DO;  Location: [place] [facility];  Service: Plastics;  Laterality: Left;    History  Smoking status  . Never Smoker   Smokeless tobacco  . Never Used    History  Alcohol Use No    Family History  Problem Relation Age of Onset  . Heart attack Mother   . Asthma Mother   . Heart disease Mother   . Hyperlipidemia Mother   . Heart attack Father   . Heart failure Father   . Deep vein thrombosis Father   . Heart disease Father   . Peripheral vascular disease Father     amputation  . Cancer Brother   . Asthma Brother   . Coronary artery disease Sister   . Heart disease Sister     Reviw of Systems:  Reviewed in the HPI.  All other systems are negative.  Physical Exam: Blood pressure 134/80, pulse 71, height 5\' 5"  (1.651 m), weight 161 lb (73.029 kg). General: Well developed, well nourished, in no acute distress.  Head: Normocephalic, atraumatic, sclera non-icteric, mucus membranes are moist,  Neck: Supple. Carotids are 2 + without bruits. No JVD Lungs: Clear bilaterally to auscultation. Heart: regular rate.  normal  S1 S2. No murmurs, gallops or rubs. Abdomen:  Soft, non-tender, non-distended with normal bowel sounds. No hepatomegaly. No rebound/guarding. No masses. Msk:  Strength and tone are normal Extremities: No clubbing or cyanosis. No edema.  Distal pedal pulses are 2+ and equal bilaterally. Neuro: Alert and oriented X 3. Moves all extremities spontaneously. Psych:  Responds to questions appropriately with a normal affect.  ECG: Oct. 27, 2016:  NSR, Q waves V2, V3.   Assessment / Plan:  : 1. Chest discomfort: Latausha presents with episodes of chest discomfort. Her symptoms are somewhat concerning for angina. She has severe asthma and has chronic lung disease. These symptoms may also be due to pulmonary hypertension. I would like to get a [place] study and an echocardiogram for further evaluation. I'll see her again in 6 months     Nahser, Wonda Cheng, MD  03/08/2015 5:19 PM    [address],  [place], [place]  [address] Pager [phone] Phone: [phone]; Fax: [phone]   [facility]  [address] [place], [place]  [address] [phone]   Fax [phone]

## 2015-03-12 NOTE — Telephone Encounter (Signed)
Katie please advise. Thanks. 

## 2015-03-14 ENCOUNTER — Telehealth (HOSPITAL_COMMUNITY): Payer: Self-pay | Admitting: *Deleted

## 2015-03-14 NOTE — Telephone Encounter (Signed)
Patient given detailed instructions per Myocardial Perfusion Study Information Sheet for the test on 03/16/15 at 715. Patient notified to arrive 15 minutes early and that it is imperative to arrive on time for appointment to keep from having the test rescheduled.  If you need to cancel or reschedule your appointment, please call the office within 24 hours of your appointment. Failure to do so may result in a cancellation of your appointment, and a $50 no show fee. Patient verbalized understanding.Hubbard Robinson, RN

## 2015-03-14 NOTE — Telephone Encounter (Signed)
Spoke with Nucala and was told that all information was taken care of and Rx would be "triaged" to speciality pharmacy. Nothing more needed at this time.

## 2015-03-15 ENCOUNTER — Telehealth: Payer: Self-pay | Admitting: Pulmonary Disease

## 2015-03-15 NOTE — Telephone Encounter (Signed)
Order date: 03/15/15 Shipping date: 03/29/15

## 2015-03-16 ENCOUNTER — Ambulatory Visit (HOSPITAL_BASED_OUTPATIENT_CLINIC_OR_DEPARTMENT_OTHER): Payer: Medicare Other

## 2015-03-16 ENCOUNTER — Other Ambulatory Visit: Payer: Self-pay

## 2015-03-16 ENCOUNTER — Ambulatory Visit (HOSPITAL_COMMUNITY): Payer: Medicare Other | Attending: Cardiovascular Disease

## 2015-03-16 DIAGNOSIS — E785 Hyperlipidemia, unspecified: Secondary | ICD-10-CM | POA: Diagnosis not present

## 2015-03-16 DIAGNOSIS — R079 Chest pain, unspecified: Secondary | ICD-10-CM

## 2015-03-16 DIAGNOSIS — I071 Rheumatic tricuspid insufficiency: Secondary | ICD-10-CM | POA: Diagnosis not present

## 2015-03-16 DIAGNOSIS — I10 Essential (primary) hypertension: Secondary | ICD-10-CM | POA: Insufficient documentation

## 2015-03-16 DIAGNOSIS — R0609 Other forms of dyspnea: Secondary | ICD-10-CM

## 2015-03-16 DIAGNOSIS — I739 Peripheral vascular disease, unspecified: Secondary | ICD-10-CM | POA: Insufficient documentation

## 2015-03-16 LAB — MYOCARDIAL PERFUSION IMAGING
CHL CUP NUCLEAR SRS: 1
CHL CUP NUCLEAR SSS: 8
CSEPPHR: 86 {beats}/min
LHR: 0.36
LV dias vol: 57 mL
LV sys vol: 8 mL
Rest HR: 75 {beats}/min
SDS: 7
TID: 0.82

## 2015-03-16 MED ORDER — TECHNETIUM TC 99M SESTAMIBI GENERIC - CARDIOLITE
10.2000 | Freq: Once | INTRAVENOUS | Status: AC | PRN
  Administered 2015-03-16: 10 via INTRAVENOUS

## 2015-03-16 MED ORDER — REGADENOSON 0.4 MG/5ML IV SOLN
0.4000 mg | Freq: Once | INTRAVENOUS | Status: AC
  Administered 2015-03-16: 0.4 mg via INTRAVENOUS

## 2015-03-16 MED ORDER — TECHNETIUM TC 99M SESTAMIBI GENERIC - CARDIOLITE
32.6000 | Freq: Once | INTRAVENOUS | Status: AC | PRN
  Administered 2015-03-16: 33 via INTRAVENOUS

## 2015-03-16 NOTE — Telephone Encounter (Signed)
Katie, please advise if anything further is needed. Thanks.

## 2015-03-19 ENCOUNTER — Telehealth: Payer: Self-pay | Admitting: Cardiovascular Disease

## 2015-03-19 NOTE — Telephone Encounter (Signed)
Returned call to patient myoview and echo results given.

## 2015-03-19 NOTE — Telephone Encounter (Signed)
New message      Pt want test results.  She is available until 9:45

## 2015-03-21 ENCOUNTER — Ambulatory Visit (INDEPENDENT_AMBULATORY_CARE_PROVIDER_SITE_OTHER): Payer: Medicare Other | Admitting: Adult Health

## 2015-03-21 ENCOUNTER — Encounter: Payer: Self-pay | Admitting: Adult Health

## 2015-03-21 VITALS — BP 134/86 | HR 72 | Temp 97.6°F | Ht 65.0 in | Wt 161.0 lb

## 2015-03-21 DIAGNOSIS — J309 Allergic rhinitis, unspecified: Secondary | ICD-10-CM | POA: Diagnosis not present

## 2015-03-21 DIAGNOSIS — J4541 Moderate persistent asthma with (acute) exacerbation: Secondary | ICD-10-CM | POA: Diagnosis not present

## 2015-03-21 MED ORDER — METHYLPREDNISOLONE ACETATE 80 MG/ML IJ SUSP
80.0000 mg | Freq: Once | INTRAMUSCULAR | Status: AC
  Administered 2015-03-21: 80 mg via INTRAMUSCULAR

## 2015-03-21 MED ORDER — METHYLPREDNISOLONE 4 MG PO TBPK: ORAL_TABLET | ORAL | Status: DC

## 2015-03-21 NOTE — Addendum Note (Signed)
Addended by: Osa Craver on: 03/21/2015 09:40 AM   Modules accepted: Orders

## 2015-03-21 NOTE — Patient Instructions (Addendum)
Mucinex DM twice daily for cough Continue with saline nasal rinses.  Continue on Allegra daily for drainage.  Medrol dose pack .  Follow up with Dr. Ashok Cordia next month as planned  Please contact office for sooner follow up if symptoms do not improve or worsen or seek emergency care

## 2015-03-21 NOTE — Assessment & Plan Note (Signed)
Flare   Plan  Mucinex DM twice daily for cough Continue with saline nasal rinses.  Continue on Allegra daily for drainage.  Medrol dose pack .  Follow up with Dr. Ashok Cordia next month as planned  Please contact office for sooner follow up if symptoms do not improve or worsen or seek emergency care

## 2015-03-21 NOTE — Assessment & Plan Note (Signed)
Recurrent flare  Depo medrol 80mg  IM x 1  If improved next week , consider beginning planned Nucala injections as directed.   Plan  Mucinex DM twice daily for cough Continue with saline nasal rinses.  Continue on Allegra daily for drainage.  Medrol dose pack .  Follow up with Dr. Ashok Cordia next month as planned  Please contact office for sooner follow up if symptoms do not improve or worsen or seek emergency care

## 2015-03-21 NOTE — Progress Notes (Signed)
Subjective:    Patient ID: [name], female    DOB: 1932/11/06, 81 y.o.   MRN: [id number]  HPI 81 yo female with significant asthma with eosinophila  Former patient of Dr. Joya Gaskins    TEST  10/10/2011 spirometry shows normal spirometry  CT chest November2015 in March 2016: No reports of emphysema seen on CT chest or interstitial lung disease  FeNO in our oifice is 47ppb and ELEVATED  03/21/2015 Follow up : [facility] for 1 month follow up  Had asthma flare last ov , tx w/ zpack and medrol dose pack.  She says she got better . Symptoms started back last few days with weather changes.  Complains of chest tightness, sinus drainage, SOB and wheezing. Non prod cough. Denies any fever, nausea or vomiting. No fever, discolored mucus , hemoptysis , edema or chest pain.  She is on maximum therapy with symbicort, singulair, nasonex, allegra, saline nasal rinses.  Previously on Xolair (injection every 2wk) , last injection 02/14/15  . Planning on changing to [facility] however has not started due to flares the last 2 office visits. She has follow up next month to establish with Dr Ashok Cordia.  Did better when she was taking Xoliar with less flares.      Past Medical History  Diagnosis Date  . Restless legs syndrome (RLS)   . Osteoporosis, unspecified     Knee and hip osteoarthritis bilaterally  . Unspecified essential hypertension   . Hyperlipidemia   . Perennial allergic rhinitis   . Diverticulosis   . Chronic kidney disease, stage II (mild)   . History of DVT of lower extremity   . History of shingles   . Asthma, severe persistent     pulmologist-- dr Joya Gaskins  . GERD (gastroesophageal reflux disease)   . DDD (degenerative disc disease), cervical   . History of breast cancer     1989  S/P   RIGHT MASTECTOMY ;  NO CHEMORADIATION //   NO RECURRENCE  . Leg ulcer, left ([place])   . Edema, lower extremity    Current Outpatient Prescriptions on File Prior to Visit  Medication Sig Dispense  Refill  . albuterol (PROAIR HFA) 108 (90 BASE) MCG/ACT inhaler Inhale 2 puffs into the lungs every 6 (six) hours as needed for wheezing or shortness of breath. 18 g 4  . albuterol (PROVENTIL) (2.5 MG/3ML) 0.083% nebulizer solution Take 3 mLs (2.5 mg total) by nebulization every 6 (six) hours as needed for wheezing or shortness of breath. Dx: J45.41 360 mL 6  . amLODipine (NORVASC) 5 MG tablet Take 5 mg by mouth daily.  11  . azelastine (ASTELIN) 0.1 % nasal spray USE 2 SPRAYS IN EACH NOSTRIL TWICE A DAY AS DIRECTED 3 mL 2  . benzonatate (TESSALON) 100 MG capsule Take 100 mg by mouth 3 (three) times daily as needed for cough.    . Calcium Carbonate-Vitamin D (CALTRATE 600+D) 600-400 MG-UNIT per tablet Take 1 tablet by mouth daily.     . chlorpheniramine (CHLOR-TRIMETON) 4 MG tablet Take by mouth. 4 mg in the morning and 8 mg at bedtime    . Cholecalciferol (VITAMIN D3) 1000 UNITS tablet Take 1,000 Units by mouth daily.      . clindamycin (CLEOCIN) 300 MG capsule Take 300 mg by mouth. Before dental work    . dextromethorphan (DELSYM) 30 MG/5ML liquid Take 60 mg by mouth 2 (two) times daily as needed for cough.    . diclofenac sodium (VOLTAREN) 1 %  GEL Apply topically as needed.    [name] EPINEPHrine 0.3 mg/0.3 mL IJ SOAJ injection Inject 0.3 mLs (0.3 mg total) into the muscle once. 2 Device 1  . esomeprazole (NEXIUM) 40 MG capsule TAKE 1 CAPSULE TWICE A DAY 180 capsule 3  . fexofenadine (ALLEGRA) 180 MG tablet Take 180 mg by mouth daily.      . furosemide (LASIX) 40 MG tablet TAKE ONE-HALF (1/2) TABLET (20 MG TOTAL) EVERY MORNING 45 tablet 3  . mometasone (NASONEX) 50 MCG/ACT nasal spray USE 2 SPRAYS NASALLY TWICE A DAY 51 g 4  . montelukast (SINGULAIR) 10 MG tablet TAKE 1 TABLET AT BEDTIME 90 tablet 3  . Multiple Vitamin (MULTIVITAMIN) tablet Take 1 tablet by mouth daily.    . potassium chloride (K-DUR) 10 MEQ tablet TAKE 1 TABLET DAILY (Patient taking differently: TAKE 1 TABLET BY MOUTH  DAILY) 90  tablet 2  . pramipexole (MIRAPEX) 0.125 MG tablet Take 0.375 mg by mouth at bedtime.     . Rivaroxaban (XARELTO) 15 MG TABS tablet Take 15 mg by mouth every other day.    . rosuvastatin (CRESTOR) 5 MG tablet TAKE 1 TABLET AT BEDTIME 90 tablet 2  . Spacer/Aero-Holding Chambers (AEROCHAMBER MV) inhaler by Other route. Use as instructed     . SYMBICORT 160-4.5 MCG/ACT inhaler INHALE 2 PUFFS INTO THE LUNGS TWICE A DAY 3 Inhaler 3  . telmisartan (MICARDIS) 20 MG tablet Take 1 tablet (20 mg total) by mouth at bedtime. 90 tablet 3  . trolamine salicylate (ASPERCREME) 10 % cream Apply 1 application topically 2 (two) times daily as needed (pain).     . vitamin B-12 (CYANOCOBALAMIN) 500 MCG tablet Take 500 mcg by mouth daily.      [name] Reasons 20 MG TABS tablet Take 20 mg by mouth every other day.     . ALPRAZolam (XANAX) 0.5 MG tablet Take 0.5 mg by mouth 2 (two) times daily as needed for anxiety.     . traMADol (ULTRAM) 50 MG tablet Take 1 tablet by mouth as needed.     . triamcinolone cream (KENALOG) 0.5 % Apply 1 application topically 3 (three) times daily as needed.     No current facility-administered medications on file prior to visit.    Review of Systems Constitutional:   No  weight loss, night sweats,  Fevers, chills, +fatigue, or  lassitude.  HEENT:   No headaches,  Difficulty swallowing,  Tooth/dental problems, or  Sore throat,                No sneezing, itching, ear ache,  +nasal congestion, post nasal drip,   CV:  No chest pain,  Orthopnea, PND, swelling in lower extremities, anasarca, dizziness, palpitations, syncope.   GI  No heartburn, indigestion, abdominal pain, nausea, vomiting, diarrhea, change in bowel habits, loss of appetite, bloody stools.   Resp:   No chest wall deformity  Skin: no rash or lesions.  GU: no dysuria, change in color of urine, no urgency or frequency.  No flank pain, no hematuria   MS:  No joint pain or swelling.  No decreased range of motion.  No back  pain.  Psych:  No change in mood or affect. No depression or anxiety.  No memory loss.         Objective:   Physical Exam GEN: A/Ox3; pleasant , NAD, elderly   HEENT:  [place]/AT,  EACs-clear, TMs-wnl, NOSE-clear drainage  THROAT-clear, no lesions, no postnasal drip or exudate noted. ,  no stridor   NECK:  Supple w/ fair ROM; no JVD; normal carotid impulses w/o bruits; no thyromegaly or nodules palpated; no lymphadenopathy.  RESP  Exp wheezing no accessory muscle use, no dullness to percussion, speaks in full sentences   CARD:  RRR, no m/r/g  , no peripheral edema, pulses intact, no cyanosis or clubbing.  GI:   Soft & nt; nml bowel sounds; no organomegaly or masses detected.  Musco: Warm bil, no deformities or joint swelling noted.   Neuro: alert, no focal deficits noted.    Skin: Warm, no lesions or rashes         Assessment & Plan:

## 2015-03-24 ENCOUNTER — Other Ambulatory Visit: Payer: Self-pay | Admitting: Critical Care Medicine

## 2015-03-26 ENCOUNTER — Telehealth: Payer: Self-pay | Admitting: Pulmonary Disease

## 2015-03-26 NOTE — Telephone Encounter (Signed)
Patient says that she wants to know when her Geradine Girt will be coming in, she is not at home.  She is at [facility] right now and will be there until Wednesday.    Katie - please advise.

## 2015-03-26 NOTE — Telephone Encounter (Signed)
Nucala has arrived in office and I will contact patient on Wednesday to schedule date for start of Nucala in office. Thanks.

## 2015-03-27 ENCOUNTER — Ambulatory Visit: Payer: Medicare Other | Admitting: Internal Medicine

## 2015-03-28 NOTE — Telephone Encounter (Signed)
For Katie to follow up on scheduling patient for appointment to start Nucala.

## 2015-03-28 NOTE — Telephone Encounter (Signed)
Spoke with patient-she is aware of Nucala rx in office. Pt has been scheduled to start Nucala injections on Friday 03-30-15 at 9:45am; pt has current Epipen and will bring with her to her appt time. Pt is also aware of the 2 hours wait time in our office after Nucala is given. All forms and folder for patient have been placed in allergy lab. Nothing more needed at this time.

## 2015-03-29 ENCOUNTER — Other Ambulatory Visit: Payer: Self-pay | Admitting: *Deleted

## 2015-03-29 MED ORDER — MONTELUKAST SODIUM 10 MG PO TABS: 10.0000 mg | ORAL_TABLET | Freq: Every day | ORAL | Status: DC

## 2015-03-30 ENCOUNTER — Ambulatory Visit (INDEPENDENT_AMBULATORY_CARE_PROVIDER_SITE_OTHER): Payer: Medicare Other

## 2015-03-30 DIAGNOSIS — J45901 Unspecified asthma with (acute) exacerbation: Secondary | ICD-10-CM | POA: Diagnosis not present

## 2015-03-30 MED ORDER — MEPOLIZUMAB 100 MG ~~LOC~~ SOLR
1.0000 mL | SUBCUTANEOUS | Status: AC
Start: 2015-03-30 — End: 2015-03-30
  Administered 2015-03-30: 1 mL via SUBCUTANEOUS

## 2015-04-04 DIAGNOSIS — R21 Rash and other nonspecific skin eruption: Secondary | ICD-10-CM | POA: Diagnosis not present

## 2015-04-20 ENCOUNTER — Ambulatory Visit (INDEPENDENT_AMBULATORY_CARE_PROVIDER_SITE_OTHER): Payer: Medicare Other

## 2015-04-20 DIAGNOSIS — J4550 Severe persistent asthma, uncomplicated: Secondary | ICD-10-CM

## 2015-04-23 MED ORDER — MEPOLIZUMAB 100 MG ~~LOC~~ SOLR
1.0000 mL | Freq: Once | SUBCUTANEOUS | Status: AC
  Administered 2015-04-20: 1 mL via SUBCUTANEOUS

## 2015-04-24 ENCOUNTER — Telehealth: Payer: Self-pay | Admitting: Pulmonary Disease

## 2015-04-24 NOTE — Telephone Encounter (Addendum)
Nucala Order Date:04/24/15 Ship Date:05/08/15  Medicine was supposed to come today(05/09/15) but didn't. Tracking # [id number] [name] is checking into it for [place].   Nucala  Vials:1 Arrival Date:05/10/15 [name] found med. This morning. Lot# [id number] Exp. Date:2/18

## 2015-04-24 NOTE — Telephone Encounter (Signed)
Mistakenly closed encounter. Will addend when Nucala comes in.

## 2015-04-26 ENCOUNTER — Telehealth: Payer: Self-pay | Admitting: Pulmonary Disease

## 2015-04-26 ENCOUNTER — Encounter: Payer: Self-pay | Admitting: Pulmonary Disease

## 2015-04-26 ENCOUNTER — Ambulatory Visit (INDEPENDENT_AMBULATORY_CARE_PROVIDER_SITE_OTHER): Payer: Medicare Other | Admitting: Pulmonary Disease

## 2015-04-26 VITALS — BP 122/66 | HR 90 | Ht 65.0 in | Wt 167.6 lb

## 2015-04-26 DIAGNOSIS — I82409 Acute embolism and thrombosis of unspecified deep veins of unspecified lower extremity: Secondary | ICD-10-CM | POA: Diagnosis not present

## 2015-04-26 DIAGNOSIS — J3089 Other allergic rhinitis: Secondary | ICD-10-CM | POA: Diagnosis not present

## 2015-04-26 DIAGNOSIS — J4550 Severe persistent asthma, uncomplicated: Secondary | ICD-10-CM

## 2015-04-26 DIAGNOSIS — Z86718 Personal history of other venous thrombosis and embolism: Secondary | ICD-10-CM | POA: Insufficient documentation

## 2015-04-26 DIAGNOSIS — K219 Gastro-esophageal reflux disease without esophagitis: Secondary | ICD-10-CM | POA: Diagnosis not present

## 2015-04-26 DIAGNOSIS — K449 Diaphragmatic hernia without obstruction or gangrene: Secondary | ICD-10-CM

## 2015-04-26 NOTE — Telephone Encounter (Signed)
PFT 10/10/11:  FVC 2.21 L (77%) FEV1 1.57 L (74%) FEV1/FVC 0.71 FEF 25-75 1.10 L (64%)  IMAGING CXR PA/LAT 08/17/14 (personally reviewed by me):  Hyperinflation with flattening of the hemidiaphragms bilaterally. No focal opacity. Hila are full bilaterally. Heart normal in size. No pleural effusion appreciated.   CT CHEST W/O 07/31/14 (personally reviewed by me):  Radiology noted a 1.3cm right thyroid nodule. No pleural effusion or thickening. No pericardial effusion. No pathologic mediastinal adenopathy. 74mm LUL nodule stable since 2007 on my review. No other area of consolidation. Mild apical emphysematous changes. Bilateral upper lobe ground glass nodules as well as a ground glass nodule in right lower lobe. Areas of linear opacity consistent with scar tissue formation.  LABS 11/20/14 Eosinophils:  0.9

## 2015-04-26 NOTE — Patient Instructions (Addendum)
1. We will do a breathing test at your follow-up appointment. 2. Continue using her Nasonex 2 sprays once daily. 3. We are stopping your Astelin nasal spray. 4. Continue using your Symbicort but only take 1 inhalation twice daily.  5. I will see you back in 3 months or sooner if needed. Please call my office if you have any new breathing problems before your next appointment. 6. Remember to discuss your new heart murmur with your cardiologist.    Testing at Next Appointment: 1. Full PFT at next appointment

## 2015-04-26 NOTE — Progress Notes (Signed)
Subjective:    Patient ID: [name], female    DOB: October 19, 1932, 81 y.o.   MRN: [id number]  C.C.:  Follow-up for Severe, Persistent Asthma, Allergic Rhinitis, DVT, & GERD w/ known Hiatal Hernia.  HPI Severe, Persistent Asthma: Patient previously started on Nucala. Patient reports she had an asthma exacerbation in October. She has tapered back to Symbicort 160/4.5 2 puffs once daily. She hasn't needed her rescue inhaler very frequently lately. She reports she is waking up at night coughing every week but this too has decreased significantly because previously she was waking up 2-3 times every night. Continues to take Singulair & Allegra.   Allergic Rhinitis:  She reports her sinus congestion & post-nasal drainage have improved significantly. She has decreased the use of her nasal sprays to 2 sprays once daily of each Astelin & Nasonex. Compliant with Singulair & Allegra.  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 Initial DVT right leg 2006. Currently on Xarelto.   GERD w/ known Hiatal Hernia:  Compliant with Nexium bid. No reflux, dyspepsia, or morning brash water taste.   Review of Systems She reports infrequent chest tightness & palpitations. No fever, chills, or sweats.  Allergies  Allergen Reactions  . Eggs Or Egg-Derived Products     RAW EGGS.. Can eat cooked eggs  . Influenza Vaccines   . Cephalosporins Rash  . Codeine Rash  . Levofloxacin Rash  . Penicillins Rash  . Pneumococcal Vaccines Rash    Current Outpatient Prescriptions on File Prior to Visit  Medication Sig Dispense Refill  . albuterol (PROAIR HFA) 108 (90 BASE) MCG/ACT inhaler Inhale 2 puffs into the lungs every 6 (six) hours as needed for wheezing or shortness of breath. 18 g 4  . albuterol (PROVENTIL) (2.5 MG/3ML) 0.083% nebulizer solution Take 3 mLs (2.5 mg total) by nebulization every 6 (six) hours as needed for wheezing or shortness of breath. Dx: J45.41 360 mL 6  .  ALPRAZolam (XANAX) 0.5 MG tablet Take 0.5 mg by mouth 2 (two) times daily as needed for anxiety.     [name] amLODipine (NORVASC) 5 MG tablet Take 5 mg by mouth daily.  11  . azelastine (ASTELIN) 0.1 % nasal spray USE 2 SPRAYS IN EACH NOSTRIL TWICE A DAY AS DIRECTED (Patient taking differently: USE 2 SPRAYS IN EACH NOSTRIL once daily) 3 mL 2  . benzonatate (TESSALON) 100 MG capsule Take 100 mg by mouth 3 (three) times daily as needed for cough.    . Calcium Carbonate-Vitamin D (CALTRATE 600+D) 600-400 MG-UNIT per tablet Take 1 tablet by mouth daily.     . chlorpheniramine (CHLOR-TRIMETON) 4 MG tablet Take by mouth. 4 mg in the morning and 8 mg at bedtime    . Cholecalciferol (VITAMIN D3) 1000 UNITS tablet Take 1,000 Units by mouth daily.      . clindamycin (CLEOCIN) 300 MG capsule Take 300 mg by mouth. Before dental work    . dextromethorphan (DELSYM) 30 MG/5ML liquid Take 60 mg by mouth 2 (two) times daily as needed for cough.    . diclofenac sodium (VOLTAREN) 1 % GEL Apply topically as needed.    [name] EPINEPHrine 0.3 mg/0.3 mL IJ SOAJ injection Inject 0.3 mLs (0.3 mg total) into the muscle once. 2 Device 1  . esomeprazole (NEXIUM) 40 MG capsule TAKE 1 CAPSULE TWICE A DAY 180 capsule 3  . fexofenadine (ALLEGRA) 180 MG tablet Take 180 mg by mouth daily.      [name]  furosemide (LASIX) 40 MG tablet TAKE ONE-HALF (1/2) TABLET (20 MG TOTAL) EVERY MORNING 45 tablet 3  . mometasone (NASONEX) 50 MCG/ACT nasal spray USE 2 SPRAYS NASALLY TWICE A DAY (Patient taking differently: USE 2 SPRAYS NASALLY once daily) 51 g 4  . montelukast (SINGULAIR) 10 MG tablet Take 1 tablet (10 mg total) by mouth at bedtime. 90 tablet 0  . Multiple Vitamin (MULTIVITAMIN) tablet Take 1 tablet by mouth daily.    . potassium chloride (K-DUR) 10 MEQ tablet TAKE 1 TABLET DAILY (Patient taking differently: TAKE 1 TABLET BY MOUTH  DAILY) 90 tablet 2  . pramipexole (MIRAPEX) 0.125 MG tablet Take 0.375 mg by mouth at bedtime.     . rosuvastatin  (CRESTOR) 5 MG tablet TAKE 1 TABLET AT BEDTIME 90 tablet 2  . Spacer/Aero-Holding Chambers (AEROCHAMBER MV) inhaler by Other route. Use as instructed     . SYMBICORT 160-4.5 MCG/ACT inhaler INHALE 2 PUFFS INTO THE LUNGS TWICE A DAY (Patient taking differently: INHALE 2 PUFFS INTO THE LUNGS once daily) 3 Inhaler 3  . telmisartan (MICARDIS) 20 MG tablet Take 1 tablet (20 mg total) by mouth at bedtime. 90 tablet 3  . triamcinolone cream (KENALOG) 0.5 % Apply 1 application topically 3 (three) times daily as needed.    . trolamine salicylate (ASPERCREME) 10 % cream Apply 1 application topically 2 (two) times daily as needed (pain).     . vitamin B-12 (CYANOCOBALAMIN) 500 MCG tablet Take 500 mcg by mouth daily.      [name] Reasons 20 MG TABS tablet Take 20 mg by mouth daily.      No current facility-administered medications on file prior to visit.    Past Medical History  Diagnosis Date  . Restless legs syndrome (RLS)   . Osteoporosis, unspecified     Knee and hip osteoarthritis bilaterally  . Unspecified essential hypertension   . Hyperlipidemia   . Perennial allergic rhinitis   . Diverticulosis   . Chronic kidney disease, stage II (mild)   . History of DVT of lower extremity   . History of shingles   . Asthma, severe persistent     pulmologist-- dr Joya Gaskins  . GERD (gastroesophageal reflux disease)   . DDD (degenerative disc disease), cervical   . History of breast cancer     1989  S/P   RIGHT MASTECTOMY ;  NO CHEMORADIATION //   NO RECURRENCE  . Leg ulcer, left ([place])   . Edema, lower extremity     Past Surgical History  Procedure Laterality Date  . Total abdominal hysterectomy w/ bilateral salpingoophorectomy  1989    done at same time as right mastectomy  . Total knee arthroplasty Right 10/05/2012    Procedure: RIGHT TOTAL KNEE ARTHROPLASTY;  Surgeon: Mauri Pole, MD;  Location: [facility];  Service: Orthopedics;  Laterality: Right;  . Cardiac catheterization  08/ 01/ 2008   dr Cathie Olden     normal -- EF of 65%  . Cataract extraction w/ intraocular lens  implant, bilateral    . Cholecystectomy  1993  . Lumbar disc surgery  [place]  . Mastectomy Bilateral rigth 1989///   left  1990    breast cancer 1989//   fibrocytic disease 1990  . Cardiovascular stress test  05-22-2011   dr Cathie Olden    normal lexiscan perfusion study/  no ischemia/  lvsf  86%  . Incision and drainage of wound Left 10/24/2013    Procedure: IRRIGATION AND DEBRIDEMENT OF  LEFT LEG WITH PLACEMENT OF ACELL AND WOUND VAC;  Surgeon: Theodoro Kos, DO;  Location: [facility];  Service: Plastics;  Laterality: Left;    Family History  Problem Relation Age of Onset  . Heart attack Mother   . Asthma Mother   . Heart disease Mother   . Hyperlipidemia Mother   . Heart attack Father   . Heart failure Father   . Deep vein thrombosis Father   . Heart disease Father   . Peripheral vascular disease Father     amputation  . Cancer Brother   . Asthma Brother   . Coronary artery disease Sister   . Heart disease Sister     Social History   Social History  . Marital Status: Married    Spouse Name: N/A  . Number of Children: N/A  . Years of Education: N/A   Social History Main Topics  . Smoking status: Never Smoker   . Smokeless tobacco: Never Used  . Alcohol Use: No  . Drug Use: No  . Sexual Activity: Not Asked   Other Topics Concern  . None   Social History Narrative   Originally from [place]. Always lived in [place]. Has traveled across the country to [place]. 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 No known asbestos exposure (brother with mesothelioma). No pets currently. No bird exposure. No mold exposure. Carpet has been removed from her home.       Objective:   Physical Exam BP 122/66 mmHg  Pulse 90  Ht 5\' 5"  (1.651 m)  Wt 167 lb 9.6 oz (76.023 kg)  BMI 27.89 kg/m2  SpO2 98% General:  Awake. Alert. No acute distress.   Integument:  Warm & dry. No  rash on exposed skin. No bruising. HEENT:  Moist mucus membranes. No oral ulcers. No nasal turbinate swelling. Cardiovascular:  Regular rate. 3/6 Murmur appreciated. Pitting lower extremity edema.  Pulmonary:  Good aeration & clear to auscultation bilaterally. Normal work of breathing on room air. Abdomen: Soft. Normal bowel sounds. Nondistended. Grossly nontender. Musculoskeletal:  Normal bulk and tone. No joint deformity or effusion appreciated.  PFT 10/10/11: FVC 2.21 L (77%) FEV1 1.57 L (74%) FEV1/FVC 0.71 FEF 25-75 1.10 L (64%)  IMAGING CXR PA/LAT 08/17/14 (personally reviewed by me): Hyperinflation with flattening of the hemidiaphragms bilaterally. No focal opacity. Hila are full bilaterally. Heart normal in size. No pleural effusion appreciated.   CT CHEST W/O 07/31/14 (personally reviewed by me): Radiology noted a 1.3cm right thyroid nodule. No pleural effusion or thickening. No pericardial effusion. No pathologic mediastinal adenopathy. 63mm LUL nodule stable since 2007 on my review. No other area of consolidation. Mild apical emphysematous changes. Bilateral upper lobe ground glass nodules as well as a ground glass nodule in right lower lobe. Areas of linear opacity consistent with scar tissue formation.  CARDIAC TTE (03/16/15): LV normal in size with EF 60-65%. Normal wall motion. Grade 1 diastolic dysfunction. LA & RA normal in size. RV normal in size and function. Pulmonary artery systolic pressure within normal range. Pulmonary artery normal in size. No aortic stenosis or regurgitation. No mitral stenosis or regurgitation. No pulmonic stenosis or regurgitation. Mild tricuspid regurgitation. No pericardial effusion.  LABS 11/20/14 Eosinophils: 0.9    Assessment & Plan:  81 year old Caucasian female with underlying severe, persistent asthma. Patient has had significant improvement in her underlying asthma as well as allergic rhinitis on Nucala. She has had a significant reduction in  the use  of her rescue inhaler and has herself been able to taper back on her asthma & allergic rhinitis medications. Her reflux seems to be well-controlled at this time. I am going to adjust her inhaler medications as well as her nasal sprays for her allergic rhinitis and potentially taper back on her reflux regimen at next appointment given her age and the risk with use of proton pump inhibitors. I did appreciate a murmur on physical exam today and reviewed her transthoracic echocardiogram from November which showed no evidence for valvular dysfunction other than mild tricuspid regurgitation. I instructed her to address this with her cardiologist at her follow-up appointment. The patient does have a prior history of bilateral lower extremity DVT without known pulmonary emboli. On reviewing her echocardiogram she has no evidence for cor pulmonale or RV dysfunction that would suggest pulmonary emboli in the past. She is currently on systemic anticoagulation without any adverse effects. She will contact my office if she has any new breathing problems before her next appointment.  1. Severe, persistent asthma: Continuing patient on Nucala. Decreasing Symbicort 160/4.5 to 1 inhalation twice daily. Continuing Allegra & Singulair without changes. Checking full pulmonary function testing at her follow-up appointment. 2. Allergic rhinitis: Continuing patient on Allegra & Singulair. Discontinuing Astelin nasal spray. Continuing Nasonex with 2 sprays each nostril once daily. 3. Bilateral lower extremity DVT: Patient currently on systemic anticoagulation with Xarelto. Continuing indefinitely. 4. GERD with known hiatal hernia: Continuing Nexium twice a day. Consider adding Zantac and decreasing dose of Nexium at next appointment. 5. Health maintenance: Patient unable to take the influenza vaccine due to allergy. Also previously had a severe allergic reaction to pneumonia vaccine. 6. Follow-up: Patient to return to clinic  in 3 months or sooner if needed.

## 2015-05-15 ENCOUNTER — Telehealth: Payer: Self-pay | Admitting: Pulmonary Disease

## 2015-05-15 NOTE — Telephone Encounter (Signed)
Mrs. Sivak states she received her Nucala injection on 04-20-15; about 1.5 to 2 weeks after that she noticed a raised, red pimple sized area on her breast. The area stayed for about 24 hours and then went away-no issues with breathing or other areas of rash,etc. Pt is aware that I have contacted [facility]ucala to report this episode and pt aware per CY in JN's absence that she is fine to continue her next Nucala injection this Friday. Nothing more needed at this time.

## 2015-05-18 ENCOUNTER — Ambulatory Visit (INDEPENDENT_AMBULATORY_CARE_PROVIDER_SITE_OTHER): Payer: Medicare Other

## 2015-05-18 DIAGNOSIS — J45901 Unspecified asthma with (acute) exacerbation: Secondary | ICD-10-CM | POA: Diagnosis not present

## 2015-05-21 MED ORDER — MEPOLIZUMAB 100 MG ~~LOC~~ SOLR
100.0000 mg | Freq: Once | SUBCUTANEOUS | Status: AC
  Administered 2015-05-21: 100 mg via SUBCUTANEOUS

## 2015-05-28 ENCOUNTER — Telehealth: Payer: Self-pay | Admitting: Internal Medicine

## 2015-05-28 MED ORDER — ONDANSETRON HCL 4 MG PO TABS: 4.0000 mg | ORAL_TABLET | Freq: Four times a day (QID) | ORAL | Status: DC | PRN

## 2015-05-28 NOTE — Telephone Encounter (Signed)
Spoke with pt. States that she thinks Nucala is making her sick. Her last injection was on 05/18/15. Reports headache and nausea. Denies vomiting or fever. Would like JN's recommendations.  JN - please advise. Thanks.

## 2015-05-28 NOTE — Telephone Encounter (Signed)
Pt is aware of VS recommendations. She requests to see JN as soon as possible. OV has been scheduled for 05/29/15 at 1:30pm. She is wondering if we can send something in for nausea.  VS - would you be willing to send something in? Thanks.

## 2015-05-28 NOTE — Telephone Encounter (Signed)
Call in script for zofran 4 mg pill, 1 pill every 6 hours as needed.  #12 with 1 refill.

## 2015-05-28 NOTE — Telephone Encounter (Signed)
Will route message to DOD since we have not received a response from [facility].

## 2015-05-28 NOTE — Telephone Encounter (Signed)
Hold off on additional injections until she can come to office to be reassessed.

## 2015-05-28 NOTE — Telephone Encounter (Signed)
Rx has been sent in per VS. Pt is aware. Nothing further was needed. 

## 2015-05-30 ENCOUNTER — Other Ambulatory Visit (INDEPENDENT_AMBULATORY_CARE_PROVIDER_SITE_OTHER): Payer: Medicare Other

## 2015-05-30 ENCOUNTER — Ambulatory Visit (INDEPENDENT_AMBULATORY_CARE_PROVIDER_SITE_OTHER): Payer: Medicare Other | Admitting: Pulmonary Disease

## 2015-05-30 ENCOUNTER — Encounter: Payer: Self-pay | Admitting: Pulmonary Disease

## 2015-05-30 VITALS — BP 122/64 | HR 89 | Ht 65.0 in | Wt 160.8 lb

## 2015-05-30 DIAGNOSIS — R0789 Other chest pain: Secondary | ICD-10-CM

## 2015-05-30 DIAGNOSIS — R079 Chest pain, unspecified: Secondary | ICD-10-CM | POA: Insufficient documentation

## 2015-05-30 DIAGNOSIS — J3089 Other allergic rhinitis: Secondary | ICD-10-CM | POA: Diagnosis not present

## 2015-05-30 DIAGNOSIS — J4550 Severe persistent asthma, uncomplicated: Secondary | ICD-10-CM | POA: Diagnosis not present

## 2015-05-30 DIAGNOSIS — K219 Gastro-esophageal reflux disease without esophagitis: Secondary | ICD-10-CM

## 2015-05-30 LAB — TROPONIN I: TNIDX: 0 ug/L (ref 0.00–0.06)

## 2015-05-30 NOTE — Addendum Note (Signed)
Addended by: Inge Rise on: 05/30/2015 02:17 PM   Modules accepted: Orders

## 2015-05-30 NOTE — Patient Instructions (Addendum)
1. Continue using your medications as prescribed. 2. Seek immediate medical attention if your chest pain or nausea comes back. 3. You can use Zantac 150mg  at night before bed if your stomach go back to normal in the next couple of weeks. 4. We will keep your appointments in March as scheduled. Please call me if you have any new problems before then.  TESTS ORDRED: 1. Labs today

## 2015-05-30 NOTE — Progress Notes (Signed)
Subjective:    Patient ID: [name], female    DOB: [birth year], 82 y.o.   MRN: [id number]  C.C.:  Follow-up for Severe, Persistent Asthma, Allergic Rhinitis, DVT, & GERD w/ known Hiatal Hernia.  HPI Severe, Persistent Asthma: Symbicort decreased to 160/4.5 1 inhalation bid at last appointment. She feels her breathing has improved significantly since she started on Nucala. She reports no wheezing. Unchanged chronic cough that is productive of a white mucus.  Allergic Rhinitis:  Astelin nasal spray stopped at last appointment. She reports constant sinus drainage. No sinus pressure or pain.   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 Initial DVT right leg 2006. Continuing systemic anticoagulation indefinitely. Currently on Xarelto.   GERD w/ known Hiatal Hernia:  She reports no reflux or morning brash water taste on Nexium.   Review of Systems She reports she had a headache Wednesday & Thursday of last week. She reports she had nausea with emesis starting Friday of last week with emesis x1. No fever, chills, or sweats. She did wake up with chest discomfort at 4am this morning. Feels nausea is improving.   Allergies  Allergen Reactions  . Eggs Or Egg-Derived Products     RAW EGGS.. Can eat cooked eggs  . Influenza Vaccines   . Cephalosporins Rash  . Codeine Rash  . Levofloxacin Rash  . Penicillins Rash  . Pneumococcal Vaccines Rash    Current Outpatient Prescriptions on File Prior to Visit  Medication Sig Dispense Refill  . albuterol (PROAIR HFA) 108 (90 BASE) MCG/ACT inhaler Inhale 2 puffs into the lungs every 6 (six) hours as needed for wheezing or shortness of breath. 18 g 4  . albuterol (PROVENTIL) (2.5 MG/3ML) 0.083% nebulizer solution Take 3 mLs (2.5 mg total) by nebulization every 6 (six) hours as needed for wheezing or shortness of breath. Dx: J45.41 360 mL 6  . ALPRAZolam (XANAX) 0.5 MG tablet Take 0.5 mg by mouth 2 (two) times daily as  needed for anxiety.     [name] amLODipine (NORVASC) 5 MG tablet Take 5 mg by mouth daily.  11  . benzonatate (TESSALON) 100 MG capsule Take 100 mg by mouth 3 (three) times daily as needed for cough.    . Calcium Carbonate-Vitamin D (CALTRATE 600+D) 600-400 MG-UNIT per tablet Take 1 tablet by mouth daily.     . chlorpheniramine (CHLOR-TRIMETON) 4 MG tablet Take by mouth. 4 mg in the morning and 8 mg at bedtime    . Cholecalciferol (VITAMIN D3) 1000 UNITS tablet Take 1,000 Units by mouth daily.      . clindamycin (CLEOCIN) 300 MG capsule Take 300 mg by mouth. Before dental work    . dextromethorphan (DELSYM) 30 MG/5ML liquid Take 60 mg by mouth 2 (two) times daily as needed for cough.    . diclofenac sodium (VOLTAREN) 1 % GEL Apply topically as needed.    [name] EPINEPHrine 0.3 mg/0.3 mL IJ SOAJ injection Inject 0.3 mLs (0.3 mg total) into the muscle once. 2 Device 1  . esomeprazole (NEXIUM) 40 MG capsule TAKE 1 CAPSULE TWICE A DAY 180 capsule 3  . fexofenadine (ALLEGRA) 180 MG tablet Take 180 mg by mouth daily.      . furosemide (LASIX) 40 MG tablet TAKE ONE-HALF (1/2) TABLET (20 MG TOTAL) EVERY MORNING 45 tablet 3  . mometasone (NASONEX) 50 MCG/ACT nasal spray USE 2 SPRAYS NASALLY TWICE A DAY (Patient taking differently: USE 2 SPRAYS NASALLY once daily) 51  g 4  . montelukast (SINGULAIR) 10 MG tablet Take 1 tablet (10 mg total) by mouth at bedtime. 90 tablet 0  . Multiple Vitamin (MULTIVITAMIN) tablet Take 1 tablet by mouth daily.    . ondansetron (ZOFRAN) 4 MG tablet Take 1 tablet (4 mg total) by mouth every 6 (six) hours as needed for nausea or vomiting. 12 tablet 1  . potassium chloride (K-DUR) 10 MEQ tablet TAKE 1 TABLET DAILY (Patient taking differently: TAKE 1 TABLET BY MOUTH  DAILY) 90 tablet 2  . pramipexole (MIRAPEX) 0.125 MG tablet Take 0.375 mg by mouth at bedtime.     . rosuvastatin (CRESTOR) 5 MG tablet TAKE 1 TABLET AT BEDTIME 90 tablet 2  . Spacer/Aero-Holding Chambers (AEROCHAMBER MV)  inhaler by Other route. Use as instructed     . SYMBICORT 160-4.5 MCG/ACT inhaler INHALE 2 PUFFS INTO THE LUNGS TWICE A DAY (Patient taking differently: INHALE 1 PUFF INTO THE LUNGS TWICE A DAY) 3 Inhaler 3  . telmisartan (MICARDIS) 20 MG tablet Take 1 tablet (20 mg total) by mouth at bedtime. 90 tablet 3  . triamcinolone cream (KENALOG) 0.5 % Apply 1 application topically 3 (three) times daily as needed.    . trolamine salicylate (ASPERCREME) 10 % cream Apply 1 application topically 2 (two) times daily as needed (pain).     . vitamin B-12 (CYANOCOBALAMIN) 500 MCG tablet Take 500 mcg by mouth daily.      [name] Reasons 20 MG TABS tablet Take 20 mg by mouth daily.      No current facility-administered medications on file prior to visit.    Past Medical History  Diagnosis Date  . Restless legs syndrome (RLS)   . Osteoporosis, unspecified     Knee and hip osteoarthritis bilaterally  . Unspecified essential hypertension   . Hyperlipidemia   . Perennial allergic rhinitis   . Diverticulosis   . Chronic kidney disease, stage II (mild)   . History of DVT of lower extremity   . History of shingles   . Asthma, severe persistent     pulmologist-- dr Joya Gaskins  . GERD (gastroesophageal reflux disease)   . DDD (degenerative disc disease), cervical   . History of breast cancer     1989  S/P   RIGHT MASTECTOMY ;  NO CHEMORADIATION //   NO RECURRENCE  . Leg ulcer, left ([place])   . Edema, lower extremity     Past Surgical History  Procedure Laterality Date  . Total abdominal hysterectomy w/ bilateral salpingoophorectomy  1989    done at same time as right mastectomy  . Total knee arthroplasty Right 10/05/2012    Procedure: RIGHT TOTAL KNEE ARTHROPLASTY;  Surgeon: Mauri Pole, MD;  Location: [facility];  Service: Orthopedics;  Laterality: Right;  . Cardiac catheterization  08/ 01/ 2008   dr Cathie Olden    normal -- EF of 65%  . Cataract extraction w/ intraocular lens  implant, bilateral    . Cholecystectomy   1993  . Lumbar disc surgery  [place]  . Mastectomy Bilateral rigth 1989///   left  1990    breast cancer 1989//   fibrocytic disease 1990  . Cardiovascular stress test  05-22-2011   dr Cathie Olden    normal lexiscan perfusion study/  no ischemia/  lvsf  86%  . Incision and drainage of wound Left 10/24/2013    Procedure: IRRIGATION AND DEBRIDEMENT OF LEFT LEG WITH PLACEMENT OF ACELL AND WOUND VAC;  Surgeon: Theodoro Kos,  DO;  Location: [place] [facility];  Service: Plastics;  Laterality: Left;    Family History  Problem Relation Age of Onset  . Heart attack Mother   . Asthma Mother   . Heart disease Mother   . Hyperlipidemia Mother   . Heart attack Father   . Heart failure Father   . Deep vein thrombosis Father   . Heart disease Father   . Peripheral vascular disease Father     amputation  . Cancer Brother   . Asthma Brother   . Coronary artery disease Sister   . Heart disease Sister     Social History   Social History  . Marital Status: Married    Spouse Name: N/A  . Number of Children: N/A  . Years of Education: N/A   Social History Main Topics  . Smoking status: Never Smoker   . Smokeless tobacco: Never Used  . Alcohol Use: No  . Drug Use: No  . Sexual Activity: Not Asked   Other Topics Concern  . None   Social History Narrative   Originally from [place]. Always lived in [place]. Has traveled across the country to [place]. 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 No known asbestos exposure (brother with mesothelioma). No pets currently. No bird exposure. No mold exposure. Carpet has been removed from her home.       Objective:   Physical Exam BP 122/64 mmHg  Pulse 89  Ht 5\' 5"  (1.651 m)  Wt 160 lb 12.8 oz (72.938 kg)  BMI 26.76 kg/m2  SpO2 98% General:  Elderly female. No distress. Appears comfortable. Integument:  Warm & dry. No rash on exposed skin.  HEENT:  Moist mucus membranes. Minimal bilateral nasal turbinate  swelling. No scleral injection. Cardiovascular:  Regular rate. 3/6 Murmur appreciated. No appreciable JVD.  Pulmonary:  Clear bilaterally to auscultation. Normal work of breathing on room air. Speaking in complete sentences. Abdomen: Soft. Normal bowel sounds. Mildly protuberant. Mildly tender to deep palpation in epigastrium. Musculoskeletal:  Normal bulk and tone. No tenderness to palpation of chest wall.  PFT 10/10/11: FVC 2.21 L (77%) FEV1 1.57 L (74%) FEV1/FVC 0.71 FEF 25-75 1.10 L (64%)  IMAGING CXR PA/LAT 08/17/14 (previously reviewed by me): Hyperinflation with flattening of the hemidiaphragms bilaterally. No focal opacity. Hila are full bilaterally. Heart normal in size. No pleural effusion appreciated.   CT CHEST W/O 07/31/14 (previously reviewed by me): Radiology noted a 1.3cm right thyroid nodule. No pleural effusion or thickening. No pericardial effusion. No pathologic mediastinal adenopathy. 88mm LUL nodule stable since 2007 on my review. No other area of consolidation. Mild apical emphysematous changes. Bilateral upper lobe ground glass nodules as well as a ground glass nodule in right lower lobe. Areas of linear opacity consistent with scar tissue formation.  CARDIAC EKG (05/30/15):  Personally reviewed by me. Normal sinus rhythm. QTc 417 ms. Previous Q-wave in V3 has resolved. No obvious signs of ischemia. Patient does have early repolarization in V1 & V2 similar to prior EKG. TTE (03/16/15): LV normal in size with EF 60-65%. Normal wall motion. Grade 1 diastolic dysfunction. LA & RA normal in size. RV normal in size and function. Pulmonary artery systolic pressure within normal range. Pulmonary artery normal in size. No aortic stenosis or regurgitation. No mitral stenosis or regurgitation. No pulmonic stenosis or regurgitation. Mild tricuspid regurgitation. No pericardial effusion.  LABS 11/20/14 Eosinophils: 0.9    Assessment & Plan:  82 year old Caucasian female with  underlying severe, persistent asthma. She has experienced chest discomfort and nausea with abdominal discomfort recently. I doubt that she has a cardiac etiology to these complaints given she is currently on systemic anticoagulation and her EKG is relatively unchanged. Even so I feel testing for a troponin I is reasonable at this time. She has experienced significant symptomatically benefit on Nucala and I do not believe her symptoms are secondary to the medication. I instructed the patient to contact my office if she had any new breathing problems before next appointment. Suspect patient's gastrointestinal complaints could be due to a viral illness given the rapid resolution.  1. Atypical chest pain: Checking troponin I. Instructed patient to seek immediate medical attention if this recurs. 2. Severe, persistent asthma: Continuing patient on Nucala. andSymbicort 160/4.5 to 1 inhalation twice daily. Continuing Allegra & Singulair without changes. Checking full pulmonary function testing at her follow-up appointment. 3. Allergic rhinitis: Continuing patient on A, Singulair, & Nasonex with 2 sprays each nostril once daily. 4. Bilateral lower extremity DVT: Patient currently on systemic anticoagulation with Xarelto. Continuing indefinitely. 5. GERD with known hiatal hernia: Continuing Nexium twice a day.  Advised she could try using Zantac 150 mg by mouth daily at bedtime if abdominal discomfort did not resolve. 6. Health maintenance: Patient unable to take the influenza vaccine due to allergy. Also previously had a severe allergic reaction to pneumonia vaccine. 7. Follow-up:  Already scheduled for follow-up appointment in March

## 2015-06-11 ENCOUNTER — Telehealth: Payer: Self-pay | Admitting: Pulmonary Disease

## 2015-06-11 NOTE — Telephone Encounter (Signed)
Nucala Vials: 1  Order Date: 06/11/15 Waiting on pt. To call back and give permission to ship..(06/10/14  Vials: Arrival Date: Lot#: Exp.:

## 2015-06-11 NOTE — Telephone Encounter (Signed)
lmtcb x1 for pt. 

## 2015-06-11 NOTE — Telephone Encounter (Signed)
Pt was wanting to speak to [employer]. This conversation was already taken care of. Nothing further was needed.

## 2015-06-14 ENCOUNTER — Telehealth: Payer: Self-pay | Admitting: Pulmonary Disease

## 2015-06-14 NOTE — Telephone Encounter (Addendum)
Nucala  Vials:1 Order Date: 06/14/15 Ship Date: 06/14/15 [name] is coming in 06/15/15, I had pt. Sch. For a later appt. Forgot to leave encounter open to put arrival in.  Vials: 1  Arrival Date:06/18/15  Lot#: [id number] Exp. Date: 5/18

## 2015-06-15 ENCOUNTER — Ambulatory Visit: Payer: Medicare Other

## 2015-06-15 ENCOUNTER — Ambulatory Visit (INDEPENDENT_AMBULATORY_CARE_PROVIDER_SITE_OTHER): Payer: Medicare Other

## 2015-06-15 DIAGNOSIS — J45901 Unspecified asthma with (acute) exacerbation: Secondary | ICD-10-CM

## 2015-06-18 MED ORDER — MEPOLIZUMAB 100 MG ~~LOC~~ SOLR
100.0000 mg | SUBCUTANEOUS | Status: DC
  Administered 2015-06-15: 100 mg via SUBCUTANEOUS

## 2015-06-28 ENCOUNTER — Telehealth: Payer: Self-pay | Admitting: Pulmonary Disease

## 2015-06-28 NOTE — Telephone Encounter (Addendum)
Nucala  Vials:1 Order Date:06/28/15 Shipping Date:07/08/14  Nucala Vials:1 Arrival Date: 07/11/15  Lot#: [id number] Exp.: 5/18

## 2015-07-13 ENCOUNTER — Ambulatory Visit (INDEPENDENT_AMBULATORY_CARE_PROVIDER_SITE_OTHER): Payer: Medicare Other

## 2015-07-13 DIAGNOSIS — J45901 Unspecified asthma with (acute) exacerbation: Secondary | ICD-10-CM

## 2015-07-18 MED ORDER — MEPOLIZUMAB 100 MG ~~LOC~~ SOLR
1.0000 mL | SUBCUTANEOUS | Status: AC
  Administered 2015-07-13: 1 mL via SUBCUTANEOUS

## 2015-07-25 ENCOUNTER — Telehealth: Payer: Self-pay | Admitting: Pulmonary Disease

## 2015-07-25 NOTE — Telephone Encounter (Signed)
Called [facility], was placed on hold for over 10 minutes.  Wcb.

## 2015-07-26 NOTE — Telephone Encounter (Signed)
Called [facility], spoke with Larene Beach, states that they received the new Rx for Nucala 07/25/15 States that [place] is not available - have to give permission to ship med   Larene Beach states that she will contact our office once she receives a call back from [facility]. Will await call back.

## 2015-08-01 ENCOUNTER — Telehealth: Payer: Self-pay | Admitting: Pulmonary Disease

## 2015-08-01 NOTE — Telephone Encounter (Signed)
PA completed on Cover my meds   Key: [id number]   For Nucala Injectable   PA has been approved from 07-02-15 through 07-31-16 Case ID: [id number].  Information updated in allergy dept as well.

## 2015-08-01 NOTE — Telephone Encounter (Signed)
Spoke with Demetra Shiner @ [facility] and they spoke with pt this morning and she authorized refill to be shipped. Nothing further needed.

## 2015-08-03 ENCOUNTER — Ambulatory Visit (INDEPENDENT_AMBULATORY_CARE_PROVIDER_SITE_OTHER): Payer: Medicare Other | Admitting: Pulmonary Disease

## 2015-08-03 ENCOUNTER — Encounter: Payer: Self-pay | Admitting: Pulmonary Disease

## 2015-08-03 VITALS — BP 122/66 | HR 81 | Ht 64.75 in | Wt 160.0 lb

## 2015-08-03 DIAGNOSIS — K219 Gastro-esophageal reflux disease without esophagitis: Secondary | ICD-10-CM | POA: Diagnosis not present

## 2015-08-03 DIAGNOSIS — I82409 Acute embolism and thrombosis of unspecified deep veins of unspecified lower extremity: Secondary | ICD-10-CM | POA: Diagnosis not present

## 2015-08-03 DIAGNOSIS — J4550 Severe persistent asthma, uncomplicated: Secondary | ICD-10-CM

## 2015-08-03 DIAGNOSIS — K449 Diaphragmatic hernia without obstruction or gangrene: Secondary | ICD-10-CM

## 2015-08-03 DIAGNOSIS — J302 Other seasonal allergic rhinitis: Secondary | ICD-10-CM | POA: Diagnosis not present

## 2015-08-03 LAB — PULMONARY FUNCTION TEST
DL/VA % PRED: 97 %
DL/VA: 4.78 ml/min/mmHg/L
DLCO cor % pred: 72 %
DLCO cor: 18.36 ml/min/mmHg
DLCO unc % pred: 69 %
DLCO unc: 17.59 ml/min/mmHg
FEF 25-75 Post: 1.65 L/sec
FEF 25-75 Pre: 1.05 L/sec
FEF2575-%CHANGE-POST: 58 %
FEF2575-%PRED-POST: 116 %
FEF2575-%Pred-Pre: 73 %
FEV1-%CHANGE-POST: 12 %
FEV1-%PRED-PRE: 81 %
FEV1-%Pred-Post: 91 %
FEV1-PRE: 1.63 L
FEV1-Post: 1.82 L
FEV1FVC-%CHANGE-POST: 1 %
FEV1FVC-%Pred-Pre: 96 %
FEV6-%CHANGE-POST: 12 %
FEV6-%PRED-PRE: 88 %
FEV6-%Pred-Post: 99 %
FEV6-PRE: 2.24 L
FEV6-Post: 2.53 L
FEV6FVC-%Change-Post: 1 %
FEV6FVC-%PRED-PRE: 104 %
FEV6FVC-%Pred-Post: 106 %
FVC-%Change-Post: 10 %
FVC-%PRED-POST: 94 %
FVC-%PRED-PRE: 85 %
FVC-POST: 2.53 L
FVC-PRE: 2.29 L
POST FEV1/FVC RATIO: 72 %
POST FEV6/FVC RATIO: 100 %
PRE FEV6/FVC RATIO: 98 %
Pre FEV1/FVC ratio: 71 %
RV % PRED: 143 %
RV: 3.49 L
TLC % pred: 115 %
TLC: 5.97 L

## 2015-08-03 MED ORDER — BENZONATATE 100 MG PO CAPS: 100.0000 mg | ORAL_CAPSULE | Freq: Three times a day (TID) | ORAL | Status: DC | PRN

## 2015-08-03 NOTE — Patient Instructions (Signed)
   Start using her Symbicort 2 puffs twice daily on a regular basis  We will stop your Nucala after your next dose  Please call me if you have any new breathing problems before your next appointment  I will see you back in 2-3 months or sooner if needed  TESTS ORDERED: 1. Spirometry with bronchodilator challenge and next appointment

## 2015-08-03 NOTE — Progress Notes (Signed)
Subjective:    Patient ID: [name], female    DOB: [birth year], 82 y.o.   MRN: [id number]  C.C.:  Follow-up for Severe, Persistent Asthma, Allergic Rhinitis, DVT, & GERD w/ known Hiatal Hernia.  HPI Severe, Persistent Asthma: Continuing on Symbicort 160/4.5 one inhalation twice daily, Nucala, Allegra, & Singulair at last appointment. She reports last month she had significant fatigue. She reports increased cough productive of a clear mucus. Does wake up coughing at night. She does have occasional wheezing. No steroids or exacerbations since last appointment. Recently she has increased her dose of Symbicort to 2 puffs twice daily intermittently. Uses her rescue inhaler rarely. Previously she was on Xolair for several years and her number of exacerbations did seem to improve some.   Allergic Rhinitis:  Continued on Nasonex, Singulair & Allegra last appointment. She reports compliance with medication. Despite this she has had increased sinus congestion & pressure.  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 Initial DVT right leg 2006. Continuing systemic anticoagulation indefinitely. Currently on Xarelto indefinitely.    GERD w/ known Hiatal Hernia:  Recommended to try Zantac at last appointment in addition to Nexium. Denies any dysphagia. No reflux or dyspepsia unless she eats late at night. No morning brash water taste.    Review of Systems She now has significant, diffuse joint pain without erythema. She attributes this to [place]. No fever, chills, or sweats. She reports her chest pain has significantly improved but she still has it intermittently.   Allergies  Allergen Reactions  . Eggs Or Egg-Derived Products     RAW EGGS.. Can eat cooked eggs  . Influenza Vaccines   . Cephalosporins Rash  . Codeine Rash  . Levofloxacin Rash  . Penicillins Rash  . Pneumococcal Vaccines Rash    Current Outpatient Prescriptions on File Prior to Visit  Medication Sig  Dispense Refill  . albuterol (PROAIR HFA) 108 (90 BASE) MCG/ACT inhaler Inhale 2 puffs into the lungs every 6 (six) hours as needed for wheezing or shortness of breath. 18 g 4  . albuterol (PROVENTIL) (2.5 MG/3ML) 0.083% nebulizer solution Take 3 mLs (2.5 mg total) by nebulization every 6 (six) hours as needed for wheezing or shortness of breath. Dx: J45.41 360 mL 6  . ALPRAZolam (XANAX) 0.5 MG tablet Take 0.5 mg by mouth 2 (two) times daily as needed for anxiety.     [name] amLODipine (NORVASC) 5 MG tablet Take 5 mg by mouth daily.  11  . benzonatate (TESSALON) 100 MG capsule Take 100 mg by mouth 3 (three) times daily as needed for cough.    . Calcium Carbonate-Vitamin D (CALTRATE 600+D) 600-400 MG-UNIT per tablet Take 1 tablet by mouth daily.     . chlorpheniramine (CHLOR-TRIMETON) 4 MG tablet Take by mouth. 4 mg in the morning and 8 mg at bedtime    . Cholecalciferol (VITAMIN D3) 1000 UNITS tablet Take 1,000 Units by mouth daily.      . clindamycin (CLEOCIN) 300 MG capsule Take 300 mg by mouth. Before dental work    . dextromethorphan (DELSYM) 30 MG/5ML liquid Take 60 mg by mouth 2 (two) times daily as needed for cough.    . diclofenac sodium (VOLTAREN) 1 % GEL Apply topically as needed.    [name] EPINEPHrine 0.3 mg/0.3 mL IJ SOAJ injection Inject 0.3 mLs (0.3 mg total) into the muscle once. 2 Device 1  . esomeprazole (NEXIUM) 40 MG capsule TAKE 1 CAPSULE TWICE A DAY  180 capsule 3  . fexofenadine (ALLEGRA) 180 MG tablet Take 180 mg by mouth daily.      . furosemide (LASIX) 40 MG tablet TAKE ONE-HALF (1/2) TABLET (20 MG TOTAL) EVERY MORNING 45 tablet 3  . mometasone (NASONEX) 50 MCG/ACT nasal spray USE 2 SPRAYS NASALLY TWICE A DAY (Patient taking differently: USE 2 SPRAYS NASALLY once daily) 51 g 4  . montelukast (SINGULAIR) 10 MG tablet Take 1 tablet (10 mg total) by mouth at bedtime. 90 tablet 0  . Multiple Vitamin (MULTIVITAMIN) tablet Take 1 tablet by mouth daily.    . potassium chloride (K-DUR) 10  MEQ tablet TAKE 1 TABLET DAILY (Patient taking differently: TAKE 1 TABLET BY MOUTH  DAILY) 90 tablet 2  . pramipexole (MIRAPEX) 0.125 MG tablet Take 0.375 mg by mouth at bedtime.     . rosuvastatin (CRESTOR) 5 MG tablet TAKE 1 TABLET AT BEDTIME 90 tablet 2  . Spacer/Aero-Holding Chambers (AEROCHAMBER MV) inhaler by Other route. Use as instructed     . SYMBICORT 160-4.5 MCG/ACT inhaler INHALE 2 PUFFS INTO THE LUNGS TWICE A DAY (Patient taking differently: INHALE 1-2 PUFF INTO THE LUNGS TWICE A DAY) 3 Inhaler 3  . telmisartan (MICARDIS) 20 MG tablet Take 1 tablet (20 mg total) by mouth at bedtime. 90 tablet 3  . triamcinolone cream (KENALOG) 0.5 % Apply 1 application topically 3 (three) times daily as needed.    . trolamine salicylate (ASPERCREME) 10 % cream Apply 1 application topically 2 (two) times daily as needed (pain).     . vitamin B-12 (CYANOCOBALAMIN) 500 MCG tablet Take 500 mcg by mouth daily.      [name] Reasons 20 MG TABS tablet Take 20 mg by mouth daily.      No current facility-administered medications on file prior to visit.    Past Medical History  Diagnosis Date  . Restless legs syndrome (RLS)   . Osteoporosis, unspecified     Knee and hip osteoarthritis bilaterally  . Unspecified essential hypertension   . Hyperlipidemia   . Perennial allergic rhinitis   . Diverticulosis   . Chronic kidney disease, stage II (mild)   . History of DVT of lower extremity   . History of shingles   . Asthma, severe persistent     pulmologist-- dr Joya Gaskins  . GERD (gastroesophageal reflux disease)   . DDD (degenerative disc disease), cervical   . History of breast cancer     1989  S/P   RIGHT MASTECTOMY ;  NO CHEMORADIATION //   NO RECURRENCE  . Leg ulcer, left ([place])   . Edema, lower extremity     Past Surgical History  Procedure Laterality Date  . Total abdominal hysterectomy w/ bilateral salpingoophorectomy  1989    done at same time as right mastectomy  . Total knee arthroplasty Right  10/05/2012    Procedure: RIGHT TOTAL KNEE ARTHROPLASTY;  Surgeon: Mauri Pole, MD;  Location: [facility];  Service: Orthopedics;  Laterality: Right;  . Cardiac catheterization  08/ 01/ 2008   dr Cathie Olden    normal -- EF of 65%  . Cataract extraction w/ intraocular lens  implant, bilateral    . Cholecystectomy  1993  . Lumbar disc surgery  [place]  . Mastectomy Bilateral rigth 1989///   left  1990    breast cancer 1989//   fibrocytic disease 1990  . Cardiovascular stress test  05-22-2011   dr Cathie Olden    normal lexiscan perfusion study/  no ischemia/  lvsf  86%  . Incision and drainage of wound Left 10/24/2013    Procedure: IRRIGATION AND DEBRIDEMENT OF LEFT LEG WITH PLACEMENT OF ACELL AND WOUND VAC;  Surgeon: Theodoro Kos, DO;  Location: [facility];  Service: Plastics;  Laterality: Left;    Family History  Problem Relation Age of Onset  . Heart attack Mother   . Asthma Mother   . Heart disease Mother   . Hyperlipidemia Mother   . Heart attack Father   . Heart failure Father   . Deep vein thrombosis Father   . Heart disease Father   . Peripheral vascular disease Father     amputation  . Cancer Brother   . Asthma Brother   . Coronary artery disease Sister   . Heart disease Sister     Social History   Social History  . Marital Status: Married    Spouse Name: N/A  . Number of Children: N/A  . Years of Education: N/A   Social History Main Topics  . Smoking status: Never Smoker   . Smokeless tobacco: Never Used  . Alcohol Use: No  . Drug Use: No  . Sexual Activity: Not Asked   Other Topics Concern  . None   Social History Narrative   Originally from [place]. Always lived in [place]. Has traveled across the country to [place]. 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 No known asbestos exposure (brother with mesothelioma). No pets currently. No bird exposure. No mold exposure. Carpet has been removed from her home.         Objective:   Physical Exam BP 122/66 mmHg  Pulse 81  Ht 5' 4.75" (1.645 m)  Wt 160 lb (72.576 kg)  BMI 26.82 kg/m2  SpO2 100% General:  Elderly female. No distress. Alert. Integument:  Warm & dry. No rash on exposed skin.  HEENT:  Moist mucus membranes. No nasal turbinate swelling. No scleral injection. Cardiovascular:  Regular rate. 3/6 Murmur appreciated. No appreciable JVD.  Pulmonary:  Good aeration bilaterally. Normal work of breathing on room air. o wheezing. Abdomen: Soft. Normal bowel sounds. Mildly protuberant.  Musculoskeletal:  Normal bulk and tone. No joint effusion appreciated.  PFT 08/03/15: FVC 2.29 L (85%) FEV1 1.63 L (81%) FEV1/FVC 0.71 FEF 25-75 1.05 L (73%) positive bronchodilator response TLC 5.97 L (115%) RV 143% ERV 35% DLCO corrected 72% (Hgb 12.1) 10/10/11:FVC 2.21 L (77%) FEV1 1.57 L (74%) FEV1/FVC 0.71 FEF 25-75 1.10 L (64%)  IMAGING CXR PA/LAT 08/17/14 (previously reviewed by me): Hyperinflation with flattening of the hemidiaphragms bilaterally. No focal opacity. Hila are full bilaterally. Heart normal in size. No pleural effusion appreciated.   CT CHEST W/O 07/31/14 (previously reviewed by me): Radiology noted a 1.3cm right thyroid nodule. No pleural effusion or thickening. No pericardial effusion. No pathologic mediastinal adenopathy. 20mm LUL nodule stable since 2007 on my review. No other area of consolidation. Mild apical emphysematous changes. Bilateral upper lobe ground glass nodules as well as a ground glass nodule in right lower lobe. Areas of linear opacity consistent with scar tissue formation.  CARDIAC EKG (05/30/15):  Previously reviewed by me. Normal sinus rhythm. QTc 417 ms. Previous Q-wave in V3 has resolved. No obvious signs of ischemia. Patient does have early repolarization in V1 & V2 similar to prior EKG. TTE (03/16/15): LV normal in size with EF 60-65%. Normal wall motion. Grade 1 diastolic dysfunction. LA & RA normal in size. RV normal in  size and function. Pulmonary artery systolic pressure within normal range. Pulmonary artery normal in size. No aortic stenosis or regurgitation. No mitral stenosis or regurgitation. No pulmonic stenosis or regurgitation. Mild tricuspid regurgitation. No pericardial effusion.  LABS 11/20/14 Eosinophils: 0.9    Assessment & Plan:  82 year old Caucasian female with underlying severe, persistent asthma. Spirometry is at least stable since previous testing in 2013. Patient does however have a significant bronchodilator response today lung volumes do show air trapping as well. I suspect she would benefit from an increased dose of her inhaled steroid therapy. It's unclear to me whether or not the patient is having any symptomatic benefit or improved control of her underlying asthma with the use of Nucala. As such I feel it's reasonable to try the patient off medication after her next dose. Certainly some element of her fatigue could be a secondary side effect of this medicine. Previously patient was on Xolair for years with possibly some reduction in the number of exacerbations but she reports she began did not have what she felt was optimal improvement in her lung function. I suspect the seasonal variation in increasing pollen counts records regarding her breathing difficult to tell as her allergic rhinitis. Her reflux seems to be very well-controlled at this time. I instructed the patient to contact my office if she had any new breathing problems before her next appointment as we hold on further treatment with Nucala.  1. Severe, persistent asthma: Holding Nucala after next dose. Continuing Symbicort 2 puffs twice daily, Allegra, & Singulair. Consider repeating serum IgE after next test. Checking spirometry with bronchodilator challenge at next appointment. 2. Allergic rhinitis: Continuing patient on Allegra, Singulair, & Nasonex with 2 sprays each nostril once daily no changes. 3. Bilateral lower extremity  DVT: Indefinite treatment with Xarelto. 4. GERD with known hiatal hernia:Controlled with Nexium. No changes. 5. Health maintenance: Patient unable to take the influenza vaccine due to allergy. Also previously had a severe allergic reaction to pneumonia vaccine. 6. Follow-up: Return to clinic in 2-3 months or sooner if needed.  Sonia Baller Ashok Cordia, M.D. [facility] Pager:  [phone] After 3pm or if no response, call [phone] 11:37 AM 08/03/2015

## 2015-08-03 NOTE — Progress Notes (Signed)
PFT done today. 

## 2015-08-08 ENCOUNTER — Other Ambulatory Visit: Payer: Self-pay | Admitting: Cardiovascular Disease

## 2015-08-10 ENCOUNTER — Ambulatory Visit (INDEPENDENT_AMBULATORY_CARE_PROVIDER_SITE_OTHER): Payer: Medicare Other

## 2015-08-10 DIAGNOSIS — J4521 Mild intermittent asthma with (acute) exacerbation: Secondary | ICD-10-CM | POA: Diagnosis not present

## 2015-08-10 MED ORDER — MEPOLIZUMAB 100 MG ~~LOC~~ SOLR
100.0000 mg | SUBCUTANEOUS | Status: DC
  Administered 2015-08-10: 100 mg via SUBCUTANEOUS

## 2015-08-31 ENCOUNTER — Telehealth: Payer: Self-pay | Admitting: Pulmonary Disease

## 2015-08-31 MED ORDER — ESOMEPRAZOLE MAGNESIUM 40 MG PO CPDR: 40.0000 mg | DELAYED_RELEASE_CAPSULE | Freq: Two times a day (BID) | ORAL | Status: DC

## 2015-08-31 NOTE — Telephone Encounter (Signed)
lmtcb X1 for pt  

## 2015-08-31 NOTE — Telephone Encounter (Signed)
Pt returning call.[name] ° °

## 2015-08-31 NOTE — Telephone Encounter (Signed)
Spoke with pt and informed that Nexium refill was sent to [facility].

## 2015-09-12 ENCOUNTER — Encounter: Payer: Self-pay | Admitting: Cardiovascular Disease

## 2015-09-12 ENCOUNTER — Ambulatory Visit (INDEPENDENT_AMBULATORY_CARE_PROVIDER_SITE_OTHER): Payer: Medicare Other | Admitting: Cardiovascular Disease

## 2015-09-12 VITALS — BP 134/84 | HR 70 | Ht 64.75 in | Wt 155.8 lb

## 2015-09-12 DIAGNOSIS — I10 Essential (primary) hypertension: Secondary | ICD-10-CM | POA: Diagnosis not present

## 2015-09-12 DIAGNOSIS — I509 Heart failure, unspecified: Secondary | ICD-10-CM | POA: Diagnosis not present

## 2015-09-12 DIAGNOSIS — I5032 Chronic diastolic (congestive) heart failure: Secondary | ICD-10-CM | POA: Diagnosis not present

## 2015-09-12 LAB — BASIC METABOLIC PANEL
BUN: 20 mg/dL (ref 7–25)
CHLORIDE: 100 mmol/L (ref 98–110)
CO2: 28 mmol/L (ref 20–31)
CREATININE: 1.34 mg/dL — AB (ref 0.60–0.88)
Calcium: 9.1 mg/dL (ref 8.6–10.4)
Glucose, Bld: 103 mg/dL — ABNORMAL HIGH (ref 65–99)
POTASSIUM: 4 mmol/L (ref 3.5–5.3)
Sodium: 139 mmol/L (ref 135–146)

## 2015-09-12 NOTE — Patient Instructions (Signed)
Medication Instructions:  Your physician recommends that you continue on your current medications as directed. Please refer to the Current Medication list given to you today.   Labwork: TODAY - basic metabolic panel   Testing/Procedures: None Ordered   Follow-Up: Your physician wants you to follow-up in: 6 months with Dr. Nahser. You will receive a reminder letter in the mail two months in advance. If you don't receive a letter, please call our office to schedule the follow-up appointment.   If you need a refill on your cardiac medications before your next appointment, please call your pharmacy.   Thank you for choosing [facility]! Melvinia Ashby, RN [phone]    

## 2015-09-12 NOTE — Progress Notes (Signed)
[name] Date of Birth  05-03-1933       [facility]    [employer] [address], [place], [place]  [address]   [place], [place]  [address] [phone]     [phone]   Fax  [phone]    Fax [phone]  Problem List: 1. History of chest pain-normal coronary arteries by heart catheterization in August, 2008 2. History of breast cancer-status post right mastectomy and 1989-status post left mastectomy in 1990 3. Hypertension 4. Hyperlipidemia 5. Asthma  History of Present Illness:  [name] has had lots of problems with her asthma this summer.  She's not had any cardiac complaints. She denies any chest pain. She's not been able to exercise much of the summer because her asthma problems.  Oct. 23, 2014:  She has had a total knee replacement since I saw her.   She is back exercising some - rehab for her knee.  Has lost a few lbs.   Oct. 27, 2015:  [name] is doing ok from a cardiac standpoint. She's had a wound on the left lower leg which she's been tending to.  She has been going to the wound Center.   [name] history of asthma and has chronic shortness of breath. No angina like pain.    Oct. 27, 2016  Has had lots of breathing issues.  Has had a rough year with her lungs.  Recently had PFTs and was found to her 47% predicted capacity ( on one of the measured functions )  Wakes up with CP on occasion .   Also has some exertional CP .   Sep 12, 2015:  [name] is doing ok from a cardiac standpoint.  Had reported some  chest pain  myoview revealed normal left ventricular systolic function and no ischemia. The echocardiogram revealed normal left ventricle systolic function. She does have grade 1 diastolic dysfunction. She has mild tricuspid regurgitation.  She has not taken her amlodipine this week - due to leg swelling . She wants to keep it on her med list just in case her BP goes up .   She tried Nucala  injections for her asthma.   Did not tolerate it   Has lost 6 lbs   Current Outpatient Prescriptions on File Prior to Visit  Medication Sig Dispense Refill  . albuterol (PROAIR HFA) 108 (90 BASE) MCG/ACT inhaler Inhale 2 puffs into the lungs every 6 (six) hours as needed for wheezing or shortness of breath. 18 g 4  . albuterol (PROVENTIL) (2.5 MG/3ML) 0.083% nebulizer solution Take 3 mLs (2.5 mg total) by nebulization every 6 (six) hours as needed for wheezing or shortness of breath. Dx: J45.41 360 mL 6  . benzonatate (TESSALON) 100 MG capsule Take 1 capsule (100 mg total) by mouth 3 (three) times daily as needed for cough. 100 capsule 1  . Calcium Carbonate-Vitamin D (CALTRATE 600+D) 600-400 MG-UNIT per tablet Take 1 tablet by mouth daily.     . chlorpheniramine (CHLOR-TRIMETON) 4 MG tablet Take by mouth. 4 mg in the morning and 8 mg at bedtime    . Cholecalciferol (VITAMIN D3) 1000 UNITS tablet Take 1,000 Units by mouth daily.      . clindamycin (CLEOCIN) 300 MG capsule Take 300 mg by mouth. Before dental work    . dextromethorphan (DELSYM) 30 MG/5ML liquid Take 60 mg by mouth 2 (two) times daily as needed for cough.    . diclofenac  sodium (VOLTAREN) 1 % GEL Apply topically as needed.    [name] EPINEPHrine 0.3 mg/0.3 mL IJ SOAJ injection Inject 0.3 mLs (0.3 mg total) into the muscle once. 2 Device 1  . esomeprazole (NEXIUM) 40 MG capsule Take 1 capsule (40 mg total) by mouth 2 (two) times daily. 180 capsule 3  . fexofenadine (ALLEGRA) 180 MG tablet Take 180 mg by mouth daily.      . furosemide (LASIX) 40 MG tablet Take 0.5 tablets (20 mg total) by mouth every morning. 45 tablet 1  . mometasone (NASONEX) 50 MCG/ACT nasal spray USE 2 SPRAYS NASALLY TWICE A DAY (Patient taking differently: USE 2 SPRAYS NASALLY once daily) 51 g 4  . montelukast (SINGULAIR) 10 MG tablet Take 1 tablet (10 mg total) by mouth at bedtime. 90 tablet 0  . Multiple Vitamin (MULTIVITAMIN) tablet Take 1 tablet by mouth daily.      . potassium chloride (K-DUR) 10 MEQ tablet TAKE 1 TABLET DAILY (Patient taking differently: TAKE 1 TABLET BY MOUTH  DAILY) 90 tablet 2  . pramipexole (MIRAPEX) 0.125 MG tablet Take 0.375 mg by mouth at bedtime.     . rosuvastatin (CRESTOR) 5 MG tablet TAKE 1 TABLET AT BEDTIME 90 tablet 2  . Spacer/Aero-Holding Chambers (AEROCHAMBER MV) inhaler by Other route. Use as instructed     . SYMBICORT 160-4.5 MCG/ACT inhaler INHALE 2 PUFFS INTO THE LUNGS TWICE A DAY (Patient taking differently: INHALE 1-2 PUFF INTO THE LUNGS TWICE A DAY) 3 Inhaler 3  . telmisartan (MICARDIS) 20 MG tablet Take 1 tablet (20 mg total) by mouth at bedtime. 90 tablet 3  . triamcinolone cream (KENALOG) 0.5 % Apply 1 application topically 3 (three) times daily as needed.    . trolamine salicylate (ASPERCREME) 10 % cream Apply 1 application topically 2 (two) times daily as needed (pain).     . vitamin B-12 (CYANOCOBALAMIN) 500 MCG tablet Take 500 mcg by mouth daily.      [name] Reasons 20 MG TABS tablet Take 20 mg by mouth daily.     [name] ALPRAZolam (XANAX) 0.5 MG tablet Take 0.5 mg by mouth 2 (two) times daily as needed for anxiety. Reported on 09/12/2015    . amLODipine (NORVASC) 5 MG tablet Take 5 mg by mouth daily. Reported on 09/12/2015  11   No current facility-administered medications on file prior to visit.    Allergies  Allergen Reactions  . Eggs Or Egg-Derived Products     RAW EGGS.. Can eat cooked eggs  . Influenza Vaccines   . Cephalosporins Rash  . Codeine Rash  . Levofloxacin Rash  . Penicillins Rash  . Pneumococcal Vaccines Rash    Past Medical History  Diagnosis Date  . Restless legs syndrome (RLS)   . Osteoporosis, unspecified     Knee and hip osteoarthritis bilaterally  . Unspecified essential hypertension   . Hyperlipidemia   . Perennial allergic rhinitis   . Diverticulosis   . Chronic kidney disease, stage II (mild)   . History of DVT of lower extremity   . History of shingles   . Asthma, severe  persistent     pulmologist-- dr Joya Gaskins  . GERD (gastroesophageal reflux disease)   . DDD (degenerative disc disease), cervical   . History of breast cancer     1989  S/P   RIGHT MASTECTOMY ;  NO CHEMORADIATION //   NO RECURRENCE  . Leg ulcer, left ([place])   . Edema, lower extremity     Past  Surgical History  Procedure Laterality Date  . Total abdominal hysterectomy w/ bilateral salpingoophorectomy  1989    done at same time as right mastectomy  . Total knee arthroplasty Right 10/05/2012    Procedure: RIGHT TOTAL KNEE ARTHROPLASTY;  Surgeon: Mauri Pole, MD;  Location: [facility];  Service: Orthopedics;  Laterality: Right;  . Cardiac catheterization  08/ 01/ 2008   dr Cathie Olden    normal -- EF of 65%  . Cataract extraction w/ intraocular lens  implant, bilateral    . Cholecystectomy  1993  . Lumbar disc surgery  [place]  . Mastectomy Bilateral rigth 1989///   left  1990    breast cancer 1989//   fibrocytic disease 1990  . Cardiovascular stress test  05-22-2011   dr Cathie Olden    normal lexiscan perfusion study/  no ischemia/  lvsf  86%  . Incision and drainage of wound Left 10/24/2013    Procedure: IRRIGATION AND DEBRIDEMENT OF LEFT LEG WITH PLACEMENT OF ACELL AND WOUND VAC;  Surgeon: Theodoro Kos, DO;  Location: [facility];  Service: Plastics;  Laterality: Left;    History  Smoking status  . Never Smoker   Smokeless tobacco  . Never Used    History  Alcohol Use No    Family History  Problem Relation Age of Onset  . Heart attack Mother   . Asthma Mother   . Heart disease Mother   . Hyperlipidemia Mother   . Heart attack Father   . Heart failure Father   . Deep vein thrombosis Father   . Heart disease Father   . Peripheral vascular disease Father     amputation  . Cancer Brother   . Asthma Brother   . Coronary artery disease Sister   . Heart disease Sister     Reviw of Systems:  Reviewed in the HPI.  All other systems are negative.  Physical  Exam: Blood pressure 134/84, pulse 70, height 5' 4.75" (1.645 m), weight 155 lb 12.8 oz (70.67 kg). General: Well developed, well nourished, in no acute distress.  Head: Normocephalic, atraumatic, sclera non-icteric, mucus membranes are moist,  Neck: Supple. Carotids are 2 + without bruits. No JVD Lungs: Clear bilaterally to auscultation. Heart: regular rate.  normal  S1 S2. No murmurs, gallops or rubs. Abdomen: Soft, non-tender, non-distended with normal bowel sounds. No hepatomegaly. No rebound/guarding. No masses. Msk:  Strength and tone are normal Extremities: No clubbing or cyanosis. No edema.  Distal pedal pulses are 2+ and equal bilaterally. Neuro: Alert and oriented X 3. Moves all extremities spontaneously. Psych:  Responds to questions appropriately with a normal affect.  ECG: Oct. 27, 2016:  NSR, Q waves V2, V3.   Assessment / Plan:  : 1. Chest discomfort: Adilah had a normal myoview after her last visit Echo showed normal LV systolic function.   Grade 1 diastolic dysfunction  2. Chronic diastolic congestive heart failure: She's doing well. Continue Lasix and potassium. We will draw a basic medical profile today.  3. Dyspnea:   She has chronic lung disease    Bowen Kia, Wonda Cheng, MD  09/12/2015 11:00 AM    [facility] [place],  [place], [place]  [address] Pager [phone] Phone: [phone]; Fax: [phone]   [facility]  [address] [place], [place]  [address] [phone]   Fax [phone]

## 2015-09-19 ENCOUNTER — Encounter: Payer: Self-pay | Admitting: Pulmonary Disease

## 2015-09-19 ENCOUNTER — Ambulatory Visit (INDEPENDENT_AMBULATORY_CARE_PROVIDER_SITE_OTHER): Payer: Medicare Other | Admitting: Pulmonary Disease

## 2015-09-19 VITALS — BP 134/72 | HR 91 | Ht 64.75 in | Wt 155.2 lb

## 2015-09-19 DIAGNOSIS — K219 Gastro-esophageal reflux disease without esophagitis: Secondary | ICD-10-CM | POA: Diagnosis not present

## 2015-09-19 DIAGNOSIS — J302 Other seasonal allergic rhinitis: Secondary | ICD-10-CM

## 2015-09-19 DIAGNOSIS — J4551 Severe persistent asthma with (acute) exacerbation: Secondary | ICD-10-CM

## 2015-09-19 DIAGNOSIS — K449 Diaphragmatic hernia without obstruction or gangrene: Secondary | ICD-10-CM

## 2015-09-19 MED ORDER — PREDNISONE 20 MG PO TABS: 40.0000 mg | ORAL_TABLET | Freq: Every day | ORAL | Status: DC

## 2015-09-19 NOTE — Patient Instructions (Signed)
   Call our office if you aren't getting any better in the next couple of days.  Use the Spiriva inhaler once daily - do 2 puffs at a time. Stop using it if you have any trouble with blurry vision or urinating. Call me if you have any problems with this medication.   Use your nebulizer every 4-6 hours regularly until your wheezing & cough start to get better.  We will keep you appointments in June as scheduled but see you back next week to make sure you're getting better.

## 2015-09-19 NOTE — Progress Notes (Signed)
Subjective:    Patient ID: [name], female    DOB: 17-Jun-1932, 82 y.o.   MRN: [id number]  C.C.:  Acute visit for breathing problems with known Severe, Persistent Asthma, Allergic Rhinitis, DVT, & GERD w/ known Hiatal Hernia.  HPI Severe, Persistent Asthma: Nucala held after last appointment. Her last dose was March 31. She reports she is continuing to have intermittent problems with dyspnea depending on weather changes. Cough produces a clear mucus. She is having intermittent wheezing. Her dyspnea is worsening. No steroids since last appointment. She is waking up at night with coughing & difficulty breathing. Has been using her rescue inhaler more frequently. Compliant with Symbicort.   Allergic Rhinitis:  Continued on Nasonex, Singulair & Allegra last appointment. She has had increased sinus congestion & pressure. Having post-nasal drainage.   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 Initial DVT right leg 2006. Currently on Xarelto indefinitely.    GERD w/ known Hiatal Hernia:  No morning brash water taste. Compliant with Nexium. No dyspepsia or reflux.   Review of Systems No fever, chills, or sweats. No nausea or emesis. She does have chest tightness but no frank pain.   Allergies  Allergen Reactions  . Eggs Or Egg-Derived Products     RAW EGGS.. Can eat cooked eggs  . Influenza Vaccines   . Nucala [Mepolizumab]   . Cephalosporins Rash  . Codeine Rash  . Levofloxacin Rash  . Penicillins Rash  . Pneumococcal Vaccines Rash    Current Outpatient Prescriptions on File Prior to Visit  Medication Sig Dispense Refill  . albuterol (PROAIR HFA) 108 (90 BASE) MCG/ACT inhaler Inhale 2 puffs into the lungs every 6 (six) hours as needed for wheezing or shortness of breath. 18 g 4  . albuterol (PROVENTIL) (2.5 MG/3ML) 0.083% nebulizer solution Take 3 mLs (2.5 mg total) by nebulization every 6 (six) hours as needed for wheezing or shortness of breath. Dx:  J45.41 360 mL 6  . ALPRAZolam (XANAX) 0.5 MG tablet Take 0.5 mg by mouth 2 (two) times daily as needed for anxiety. Reported on 09/12/2015    . amLODipine (NORVASC) 5 MG tablet Take 5 mg by mouth daily. Reported on 09/12/2015  11  . benzonatate (TESSALON) 100 MG capsule Take 1 capsule (100 mg total) by mouth 3 (three) times daily as needed for cough. 100 capsule 1  . Calcium Carbonate-Vitamin D (CALTRATE 600+D) 600-400 MG-UNIT per tablet Take 1 tablet by mouth daily.     . chlorpheniramine (CHLOR-TRIMETON) 4 MG tablet Take by mouth. 4 mg in the morning and 8 mg at bedtime    . Cholecalciferol (VITAMIN D3) 1000 UNITS tablet Take 1,000 Units by mouth daily.      . clindamycin (CLEOCIN) 300 MG capsule Take 300 mg by mouth. Before dental work    . dextromethorphan (DELSYM) 30 MG/5ML liquid Take 60 mg by mouth 2 (two) times daily as needed for cough.    . diclofenac sodium (VOLTAREN) 1 % GEL Apply topically as needed.    [name] EPINEPHrine 0.3 mg/0.3 mL IJ SOAJ injection Inject 0.3 mLs (0.3 mg total) into the muscle once. 2 Device 1  . esomeprazole (NEXIUM) 40 MG capsule Take 1 capsule (40 mg total) by mouth 2 (two) times daily. 180 capsule 3  . fexofenadine (ALLEGRA) 180 MG tablet Take 180 mg by mouth daily.      . furosemide (LASIX) 40 MG tablet Take 0.5 tablets (20 mg total) by mouth  every morning. 45 tablet 1  . mometasone (NASONEX) 50 MCG/ACT nasal spray USE 2 SPRAYS NASALLY TWICE A DAY (Patient taking differently: USE 2 SPRAYS NASALLY once daily) 51 g 4  . montelukast (SINGULAIR) 10 MG tablet Take 1 tablet (10 mg total) by mouth at bedtime. 90 tablet 0  . Multiple Vitamin (MULTIVITAMIN) tablet Take 1 tablet by mouth daily.    . potassium chloride (K-DUR) 10 MEQ tablet TAKE 1 TABLET DAILY (Patient taking differently: TAKE 1 TABLET BY MOUTH  DAILY) 90 tablet 2  . pramipexole (MIRAPEX) 0.125 MG tablet Take 0.375 mg by mouth at bedtime.     . rosuvastatin (CRESTOR) 5 MG tablet TAKE 1 TABLET AT BEDTIME 90  tablet 2  . Spacer/Aero-Holding Chambers (AEROCHAMBER MV) inhaler by Other route. Use as instructed     . SYMBICORT 160-4.5 MCG/ACT inhaler INHALE 2 PUFFS INTO THE LUNGS TWICE A DAY (Patient taking differently: INHALE 1-2 PUFF INTO THE LUNGS TWICE A DAY) 3 Inhaler 3  . telmisartan (MICARDIS) 20 MG tablet Take 1 tablet (20 mg total) by mouth at bedtime. 90 tablet 3  . triamcinolone cream (KENALOG) 0.5 % Apply 1 application topically 3 (three) times daily as needed.    . trolamine salicylate (ASPERCREME) 10 % cream Apply 1 application topically 2 (two) times daily as needed (pain).     . vitamin B-12 (CYANOCOBALAMIN) 500 MCG tablet Take 500 mcg by mouth daily.      [name] Reasons 20 MG TABS tablet Take 20 mg by mouth daily.      No current facility-administered medications on file prior to visit.    Past Medical History  Diagnosis Date  . Restless legs syndrome (RLS)   . Osteoporosis, unspecified     Knee and hip osteoarthritis bilaterally  . Unspecified essential hypertension   . Hyperlipidemia   . Perennial allergic rhinitis   . Diverticulosis   . Chronic kidney disease, stage II (mild)   . History of DVT of lower extremity   . History of shingles   . Asthma, severe persistent     pulmologist-- dr Joya Gaskins  . GERD (gastroesophageal reflux disease)   . DDD (degenerative disc disease), cervical   . History of breast cancer     1989  S/P   RIGHT MASTECTOMY ;  NO CHEMORADIATION //   NO RECURRENCE  . Leg ulcer, left ([place])   . Edema, lower extremity     Past Surgical History  Procedure Laterality Date  . Total abdominal hysterectomy w/ bilateral salpingoophorectomy  1989    done at same time as right mastectomy  . Total knee arthroplasty Right 10/05/2012    Procedure: RIGHT TOTAL KNEE ARTHROPLASTY;  Surgeon: Mauri Pole, MD;  Location: [facility];  Service: Orthopedics;  Laterality: Right;  . Cardiac catheterization  08/ 01/ 2008   dr Cathie Olden    normal -- EF of 65%  . Cataract extraction w/  intraocular lens  implant, bilateral    . Cholecystectomy  1993  . Lumbar disc surgery  [place]  . Mastectomy Bilateral rigth 1989///   left  1990    breast cancer 1989//   fibrocytic disease 1990  . Cardiovascular stress test  05-22-2011   dr Cathie Olden    normal lexiscan perfusion study/  no ischemia/  lvsf  86%  . Incision and drainage of wound Left 10/24/2013    Procedure: IRRIGATION AND DEBRIDEMENT OF LEFT LEG WITH PLACEMENT OF ACELL AND WOUND VAC;  Surgeon:  Claire Sanger, DO;  Location: [facility];  Service: Plastics;  Laterality: Left;    Family History  Problem Relation Age of Onset  . Heart attack Mother   . Asthma Mother   . Heart disease Mother   . Hyperlipidemia Mother   . Heart attack Father   . Heart failure Father   . Deep vein thrombosis Father   . Heart disease Father   . Peripheral vascular disease Father     amputation  . Cancer Brother   . Asthma Brother   . Coronary artery disease Sister   . Heart disease Sister     Social History   Social History  . Marital Status: Married    Spouse Name: N/A  . Number of Children: N/A  . Years of Education: N/A   Social History Main Topics  . Smoking status: Never Smoker   . Smokeless tobacco: Never Used  . Alcohol Use: No  . Drug Use: No  . Sexual Activity: Not Asked   Other Topics Concern  . None   Social History Narrative   Originally from [place]. Always lived in [place]. Has traveled across the country to [place]. 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 No known asbestos exposure (brother with mesothelioma). No pets currently. No bird exposure. No mold exposure. Carpet has been removed from her home.       Objective:   Physical Exam BP 134/72 mmHg  Pulse 91  Ht 5' 4.75" (1.645 m)  Wt 155 lb 3.2 oz (70.398 kg)  BMI 26.02 kg/m2  SpO2 98% General:  Elderly female. Awake. Alert. Integument:  Warm & dry. No rash on exposed skin.  HEENT:  Moist mucus  membranes. No nasal turbinate swelling. No scleral icterus. Cardiovascular:  Regular rate. 3/6 Murmur appreciated. No appreciable JVD.  Pulmonary: Mild bilateral wheezing. Normal work of breathing on room air. Speaking in complete sentences. Abdomen: Soft. Normal bowel sounds. Mildly protuberant.  Musculoskeletal:  Normal bulk and tone. No joint effusion appreciated.  PFT 08/03/15: FVC 2.29 L (85%) FEV1 1.63 L (81%) FEV1/FVC 0.71 FEF 25-75 1.05 L (73%) positive bronchodilator response TLC 5.97 L (115%) RV 143% ERV 35% DLCO corrected 72% (Hgb 12.1) 10/10/11:FVC 2.21 L (77%) FEV1 1.57 L (74%) FEV1/FVC 0.71 FEF 25-75 1.10 L (64%)  IMAGING CXR PA/LAT 08/17/14 (previously reviewed by me): Hyperinflation with flattening of the hemidiaphragms bilaterally. No focal opacity. Hila are full bilaterally. Heart normal in size. No pleural effusion appreciated.   CT CHEST W/O 07/31/14 (previously reviewed by me): Radiology noted a 1.3cm right thyroid nodule. No pleural effusion or thickening. No pericardial effusion. No pathologic mediastinal adenopathy. 82mm LUL nodule stable since 2007 on my review. No other area of consolidation. Mild apical emphysematous changes. Bilateral upper lobe ground glass nodules as well as a ground glass nodule in right lower lobe. Areas of linear opacity consistent with scar tissue formation.  CARDIAC EKG (05/30/15):  Previously reviewed by me. Normal sinus rhythm. QTc 417 ms. Previous Q-wave in V3 has resolved. No obvious signs of ischemia. Patient does have early repolarization in V1 & V2 similar to prior EKG.  TTE (03/16/15): LV normal in size with EF 60-65%. Normal wall motion. Grade 1 diastolic dysfunction. LA & RA normal in size. RV normal in size and function. Pulmonary artery systolic pressure within normal range. Pulmonary artery normal in size. No aortic stenosis or regurgitation. No mitral stenosis or regurgitation. No pulmonic stenosis or regurgitation. Mild tricuspid  regurgitation. No pericardial effusion.  LABS 11/20/14 Eosinophils: 0.9    Assessment & Plan:  82 year old Caucasian female with underlying severe, persistent asthma with mild exacerbation. Patient wouldn't likely benefit from Xolair therapy but we will need to treat her for her acute exacerbation. Reflux seems to be well-controlled at this time as well. I am initiating the patient on anticholinergic medication at this time in an effort to further control her symptoms. I instructed the patient contact our office if she had any clinical worsening or fail to improve with the treatment we are starting today.   1. Severe, Persistent Asthma w/ Exacerbation: Continuing Symbicort & Singulair. Already scheduled for PFTs at follow-up appointment. Prednisone 40 mg by mouth daily 4 days & starting Spiriva Respimat inhaler. If patient continues to tolerate Spiriva & is clinically improving next week plan to prescribe Spiriva at that appointment. Plan to reassess with serum IgE at next appointment in June. 2. Allergic rhinitis: Continuing Allegra, Singulair, & Nasonex with 2 sprays each nostril once daily no changes. 3. Bilateral lower extremity DVT: Indefinite treatment with Xarelto. 4. GERD with known hiatal hernia: Asymptomatic on Nexium. No changes at this time. 5. Health maintenance: Patient unable to take the influenza vaccine due to allergy. Also previously had a severe allergic reaction to pneumonia vaccine. 6. Follow-up: Return to clinic in 1 week. Patient will keep her follow-up appointment with me in June.   Sonia Baller Ashok Cordia, M.D. [facility] Pager:  [phone] After 3pm or if no response, call [phone] 1:50 PM 09/19/2015

## 2015-10-03 DIAGNOSIS — M50322 Other cervical disc degeneration at C5-C6 level: Secondary | ICD-10-CM | POA: Diagnosis not present

## 2015-10-03 DIAGNOSIS — M542 Cervicalgia: Secondary | ICD-10-CM | POA: Diagnosis not present

## 2015-10-04 ENCOUNTER — Telehealth: Payer: Self-pay | Admitting: Pulmonary Disease

## 2015-10-04 DIAGNOSIS — H40013 Open angle with borderline findings, low risk, bilateral: Secondary | ICD-10-CM | POA: Diagnosis not present

## 2015-10-04 DIAGNOSIS — Z961 Presence of intraocular lens: Secondary | ICD-10-CM | POA: Diagnosis not present

## 2015-10-04 DIAGNOSIS — E119 Type 2 diabetes mellitus without complications: Secondary | ICD-10-CM | POA: Diagnosis not present

## 2015-10-04 DIAGNOSIS — H04123 Dry eye syndrome of bilateral lacrimal glands: Secondary | ICD-10-CM | POA: Diagnosis not present

## 2015-10-04 DIAGNOSIS — H1851 Endothelial corneal dystrophy: Secondary | ICD-10-CM | POA: Diagnosis not present

## 2015-10-04 MED ORDER — TIOTROPIUM BROMIDE MONOHYDRATE 2.5 MCG/ACT IN AERS: 1.0000 | INHALATION_SPRAY | Freq: Every day | RESPIRATORY_TRACT | Status: DC

## 2015-10-04 NOTE — Telephone Encounter (Signed)
Spoke with pt and she needs refill on Spiriva Respimat sent to pharmacy. Rx verified with pt and sent to pharmacy as requested. Nothing further needed.

## 2015-10-23 DIAGNOSIS — N390 Urinary tract infection, site not specified: Secondary | ICD-10-CM | POA: Diagnosis not present

## 2015-10-23 DIAGNOSIS — I10 Essential (primary) hypertension: Secondary | ICD-10-CM | POA: Diagnosis not present

## 2015-10-23 DIAGNOSIS — E784 Other hyperlipidemia: Secondary | ICD-10-CM | POA: Diagnosis not present

## 2015-10-23 DIAGNOSIS — E038 Other specified hypothyroidism: Secondary | ICD-10-CM | POA: Diagnosis not present

## 2015-10-23 DIAGNOSIS — R8299 Other abnormal findings in urine: Secondary | ICD-10-CM | POA: Diagnosis not present

## 2015-10-23 DIAGNOSIS — R7301 Impaired fasting glucose: Secondary | ICD-10-CM | POA: Diagnosis not present

## 2015-10-23 DIAGNOSIS — M859 Disorder of bone density and structure, unspecified: Secondary | ICD-10-CM | POA: Diagnosis not present

## 2015-10-30 DIAGNOSIS — M25551 Pain in right hip: Secondary | ICD-10-CM | POA: Diagnosis not present

## 2015-10-30 DIAGNOSIS — R600 Localized edema: Secondary | ICD-10-CM | POA: Diagnosis not present

## 2015-10-30 DIAGNOSIS — M25552 Pain in left hip: Secondary | ICD-10-CM | POA: Diagnosis not present

## 2015-10-30 DIAGNOSIS — Z1389 Encounter for screening for other disorder: Secondary | ICD-10-CM | POA: Diagnosis not present

## 2015-10-30 DIAGNOSIS — E876 Hypokalemia: Secondary | ICD-10-CM | POA: Diagnosis not present

## 2015-10-30 DIAGNOSIS — I82409 Acute embolism and thrombosis of unspecified deep veins of unspecified lower extremity: Secondary | ICD-10-CM | POA: Diagnosis not present

## 2015-10-30 DIAGNOSIS — D692 Other nonthrombocytopenic purpura: Secondary | ICD-10-CM | POA: Diagnosis not present

## 2015-10-30 DIAGNOSIS — Z6827 Body mass index (BMI) 27.0-27.9, adult: Secondary | ICD-10-CM | POA: Diagnosis not present

## 2015-10-30 DIAGNOSIS — C50919 Malignant neoplasm of unspecified site of unspecified female breast: Secondary | ICD-10-CM | POA: Diagnosis not present

## 2015-10-30 DIAGNOSIS — Z0000 Encounter for general adult medical examination without abnormal findings: Secondary | ICD-10-CM | POA: Diagnosis not present

## 2015-10-30 DIAGNOSIS — J45998 Other asthma: Secondary | ICD-10-CM | POA: Diagnosis not present

## 2015-10-30 DIAGNOSIS — F418 Other specified anxiety disorders: Secondary | ICD-10-CM | POA: Diagnosis not present

## 2015-10-31 ENCOUNTER — Encounter: Payer: Self-pay | Admitting: Pulmonary Disease

## 2015-10-31 ENCOUNTER — Ambulatory Visit (INDEPENDENT_AMBULATORY_CARE_PROVIDER_SITE_OTHER): Payer: Medicare Other | Admitting: Pulmonary Disease

## 2015-10-31 ENCOUNTER — Other Ambulatory Visit (INDEPENDENT_AMBULATORY_CARE_PROVIDER_SITE_OTHER): Payer: Medicare Other

## 2015-10-31 VITALS — BP 128/86 | HR 79 | Ht 64.75 in | Wt 157.0 lb

## 2015-10-31 DIAGNOSIS — K219 Gastro-esophageal reflux disease without esophagitis: Secondary | ICD-10-CM | POA: Diagnosis not present

## 2015-10-31 DIAGNOSIS — J309 Allergic rhinitis, unspecified: Secondary | ICD-10-CM

## 2015-10-31 DIAGNOSIS — J4550 Severe persistent asthma, uncomplicated: Secondary | ICD-10-CM

## 2015-10-31 DIAGNOSIS — J45909 Unspecified asthma, uncomplicated: Secondary | ICD-10-CM | POA: Diagnosis not present

## 2015-10-31 LAB — CBC WITH DIFFERENTIAL/PLATELET
BASOS ABS: 0 10*3/uL (ref 0.0–0.1)
Basophils Relative: 0.4 % (ref 0.0–3.0)
EOS ABS: 0.3 10*3/uL (ref 0.0–0.7)
EOS PCT: 6.7 % — AB (ref 0.0–5.0)
HCT: 38.8 % (ref 36.0–46.0)
HEMOGLOBIN: 12.7 g/dL (ref 12.0–15.0)
LYMPHS ABS: 1.1 10*3/uL (ref 0.7–4.0)
Lymphocytes Relative: 22.6 % (ref 12.0–46.0)
MCHC: 32.8 g/dL (ref 30.0–36.0)
MCV: 88.1 fl (ref 78.0–100.0)
MONO ABS: 0.4 10*3/uL (ref 0.1–1.0)
Monocytes Relative: 8.5 % (ref 3.0–12.0)
NEUTROS PCT: 61.8 % (ref 43.0–77.0)
Neutro Abs: 3.1 10*3/uL (ref 1.4–7.7)
Platelets: 230 10*3/uL (ref 150.0–400.0)
RBC: 4.41 Mil/uL (ref 3.87–5.11)
RDW: 13.7 % (ref 11.5–15.5)
WBC: 5.1 10*3/uL (ref 4.0–10.5)

## 2015-10-31 LAB — PULMONARY FUNCTION TEST
FEF 25-75 PRE: 0.62 L/s
FEF 25-75 Post: 1.18 L/sec
FEF2575-%CHANGE-POST: 89 %
FEF2575-%PRED-POST: 83 %
FEF2575-%Pred-Pre: 43 %
FEV1-%Change-Post: 14 %
FEV1-%PRED-POST: 72 %
FEV1-%Pred-Pre: 63 %
FEV1-Post: 1.44 L
FEV1-Pre: 1.26 L
FEV1FVC-%CHANGE-POST: 5 %
FEV1FVC-%Pred-Pre: 90 %
FEV6-%Change-Post: 11 %
FEV6-%PRED-PRE: 72 %
FEV6-%Pred-Post: 81 %
FEV6-POST: 2.05 L
FEV6-PRE: 1.83 L
FEV6FVC-%Change-Post: 2 %
FEV6FVC-%PRED-PRE: 103 %
FEV6FVC-%Pred-Post: 106 %
FVC-%CHANGE-POST: 9 %
FVC-%PRED-PRE: 70 %
FVC-%Pred-Post: 76 %
FVC-POST: 2.05 L
FVC-PRE: 1.88 L
POST FEV1/FVC RATIO: 70 %
Post FEV6/FVC ratio: 100 %
Pre FEV1/FVC ratio: 67 %
Pre FEV6/FVC Ratio: 98 %

## 2015-10-31 MED ORDER — PREDNISONE 20 MG PO TABS: 40.0000 mg | ORAL_TABLET | Freq: Every day | ORAL | Status: DC

## 2015-10-31 NOTE — Progress Notes (Signed)
Spirometry pre and post done today. 

## 2015-10-31 NOTE — Progress Notes (Signed)
Subjective:    Patient ID: [name], female    DOB: 02/12/1933, 82 y.o.   MRN: [id number]  C.C.:  Follow-up with Severe, Persistent Asthma, Allergic Rhinitis, DVT, & GERD w/ Known Hiatal Hernia.  HPI Severe, Persistent Asthma:  Treated with prednisone & started on Spiriva Respimat at last appointment during exacerbation. She feels her breathing has continued to worsen. She does feel Spiriva has helped but she continues to have intermittent coughing productive of a clear phlegm. She is unable to sleep at night with her coughing. She has had wheezing. She does feel her breathing is changing significantly with all of the weather changes. She is using her rescue inhaler intermittently.   Allergic Rhinitis:  On Nasonex, Singulair & Allegra. She reports mild and constant post nasal drainage & congestion.  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 Initial DVT right leg 2006. On Xarelto indefinitely.    GERD w/ Known Hiatal Hernia:  On Nexium. No reflux, dyspepsia, or morning brash water taste. Sleeping with head of bed elevated.   Review of Systems No fever, chills, or sweats. No chest pain or pressure. No nausea or emesis.  Allergies  Allergen Reactions  . Eggs Or Egg-Derived Products     RAW EGGS.. Can eat cooked eggs  . Influenza Vaccines   . Nucala [Mepolizumab]   . Cephalosporins Rash  . Codeine Rash  . Levofloxacin Rash  . Penicillins Rash  . Pneumococcal Vaccines Rash    Current Outpatient Prescriptions on File Prior to Visit  Medication Sig Dispense Refill  . albuterol (PROAIR HFA) 108 (90 BASE) MCG/ACT inhaler Inhale 2 puffs into the lungs every 6 (six) hours as needed for wheezing or shortness of breath. 18 g 4  . albuterol (PROVENTIL) (2.5 MG/3ML) 0.083% nebulizer solution Take 3 mLs (2.5 mg total) by nebulization every 6 (six) hours as needed for wheezing or shortness of breath. Dx: J45.41 360 mL 6  . benzonatate (TESSALON) 100 MG capsule  Take 1 capsule (100 mg total) by mouth 3 (three) times daily as needed for cough. 100 capsule 1  . Calcium Carbonate-Vitamin D (CALTRATE 600+D) 600-400 MG-UNIT per tablet Take 1 tablet by mouth daily.     . chlorpheniramine (CHLOR-TRIMETON) 4 MG tablet Take by mouth. 4 mg in the morning and 8 mg at bedtime    . Cholecalciferol (VITAMIN D3) 1000 UNITS tablet Take 1,000 Units by mouth daily.      . clindamycin (CLEOCIN) 300 MG capsule Take 300 mg by mouth. Before dental work    . dextromethorphan (DELSYM) 30 MG/5ML liquid Take 60 mg by mouth 2 (two) times daily as needed for cough.    . diclofenac sodium (VOLTAREN) 1 % GEL Apply topically as needed.    [name] EPINEPHrine 0.3 mg/0.3 mL IJ SOAJ injection Inject 0.3 mLs (0.3 mg total) into the muscle once. 2 Device 1  . esomeprazole (NEXIUM) 40 MG capsule Take 1 capsule (40 mg total) by mouth 2 (two) times daily. 180 capsule 3  . fexofenadine (ALLEGRA) 180 MG tablet Take 180 mg by mouth daily.      . furosemide (LASIX) 40 MG tablet Take 0.5 tablets (20 mg total) by mouth every morning. 45 tablet 1  . mometasone (NASONEX) 50 MCG/ACT nasal spray USE 2 SPRAYS NASALLY TWICE A DAY (Patient taking differently: USE 2 SPRAYS NASALLY once daily) 51 g 4  . montelukast (SINGULAIR) 10 MG tablet Take 1 tablet (10 mg total) by mouth  at bedtime. 90 tablet 0  . Multiple Vitamin (MULTIVITAMIN) tablet Take 1 tablet by mouth daily.    . potassium chloride (K-DUR) 10 MEQ tablet TAKE 1 TABLET DAILY (Patient taking differently: TAKE 1 TABLET BY MOUTH  DAILY) 90 tablet 2  . pramipexole (MIRAPEX) 0.125 MG tablet Take 0.375 mg by mouth at bedtime.     . rosuvastatin (CRESTOR) 5 MG tablet TAKE 1 TABLET AT BEDTIME 90 tablet 2  . Spacer/Aero-Holding Chambers (AEROCHAMBER MV) inhaler by Other route. Use as instructed     . SYMBICORT 160-4.5 MCG/ACT inhaler INHALE 2 PUFFS INTO THE LUNGS TWICE A DAY (Patient taking differently: INHALE 1-2 PUFF INTO THE LUNGS TWICE A DAY) 3 Inhaler 3  .  telmisartan (MICARDIS) 20 MG tablet Take 1 tablet (20 mg total) by mouth at bedtime. 90 tablet 3  . Tiotropium Bromide Monohydrate (SPIRIVA RESPIMAT) 2.5 MCG/ACT AERS Inhale 1 puff into the lungs daily. 1 Inhaler 5  . triamcinolone cream (KENALOG) 0.5 % Apply 1 application topically 3 (three) times daily as needed.    . trolamine salicylate (ASPERCREME) 10 % cream Apply 1 application topically 2 (two) times daily as needed (pain).     . vitamin B-12 (CYANOCOBALAMIN) 500 MCG tablet Take 500 mcg by mouth daily.      [name] Reasons 20 MG TABS tablet Take 20 mg by mouth daily.      No current facility-administered medications on file prior to visit.    Past Medical History  Diagnosis Date  . Restless legs syndrome (RLS)   . Osteoporosis, unspecified     Knee and hip osteoarthritis bilaterally  . Unspecified essential hypertension   . Hyperlipidemia   . Perennial allergic rhinitis   . Diverticulosis   . Chronic kidney disease, stage II (mild)   . History of DVT of lower extremity   . History of shingles   . Asthma, severe persistent     pulmologist-- dr Joya Gaskins  . GERD (gastroesophageal reflux disease)   . DDD (degenerative disc disease), cervical   . History of breast cancer     1989  S/P   RIGHT MASTECTOMY ;  NO CHEMORADIATION //   NO RECURRENCE  . Leg ulcer, left ([place])   . Edema, lower extremity     Past Surgical History  Procedure Laterality Date  . Total abdominal hysterectomy w/ bilateral salpingoophorectomy  1989    done at same time as right mastectomy  . Total knee arthroplasty Right 10/05/2012    Procedure: RIGHT TOTAL KNEE ARTHROPLASTY;  Surgeon: Mauri Pole, MD;  Location: [facility];  Service: Orthopedics;  Laterality: Right;  . Cardiac catheterization  08/ 01/ 2008   dr Cathie Olden    normal -- EF of 65%  . Cataract extraction w/ intraocular lens  implant, bilateral    . Cholecystectomy  1993  . Lumbar disc surgery  [place]  . Mastectomy Bilateral rigth 1989///   left   1990    breast cancer 1989//   fibrocytic disease 1990  . Cardiovascular stress test  05-22-2011   dr Cathie Olden    normal lexiscan perfusion study/  no ischemia/  lvsf  86%  . Incision and drainage of wound Left 10/24/2013    Procedure: IRRIGATION AND DEBRIDEMENT OF LEFT LEG WITH PLACEMENT OF ACELL AND WOUND VAC;  Surgeon: Theodoro Kos, DO;  Location: [facility];  Service: Plastics;  Laterality: Left;    Family History  Problem Relation Age of Onset  .  Heart attack Mother   . Asthma Mother   . Heart disease Mother   . Hyperlipidemia Mother   . Heart attack Father   . Heart failure Father   . Deep vein thrombosis Father   . Heart disease Father   . Peripheral vascular disease Father     amputation  . Cancer Brother   . Asthma Brother   . Coronary artery disease Sister   . Heart disease Sister     Social History   Social History  . Marital Status: Married    Spouse Name: N/A  . Number of Children: N/A  . Years of Education: N/A   Social History Main Topics  . Smoking status: Never Smoker   . Smokeless tobacco: Never Used  . Alcohol Use: No  . Drug Use: No  . Sexual Activity: Not Asked   Other Topics Concern  . None   Social History Narrative   Originally from [place]. Always lived in [place]. Has traveled across the country to [place]. 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 No known asbestos exposure (brother with mesothelioma). No pets currently. No bird exposure. No mold exposure. Carpet has been removed from her home.       Objective:   Physical Exam BP 128/86 mmHg  Pulse 79  Ht 5' 4.75" (1.645 m)  Wt 157 lb (71.215 kg)  BMI 26.32 kg/m2  SpO2 97% General:  Elderly female. Awake. No distress. Integument:  Warm & dry. No rash on exposed skin.  HEENT:  Moist mucus membranes. Mild nasal turbinate swelling. No scleral icterus. Cardiovascular:  Regular rate. 3/6 Murmur appreciated. No appreciable JVD.  Pulmonary: Mild  atypical wheezing. Otherwise good aeration bilaterally. Normal work of breathing on room air. Abdomen: Soft. Normal bowel sounds. Mildly protuberant but nontender.  Musculoskeletal:  Normal bulk and tone. No joint effusion appreciated.  PFT 10/31/15: FVC 1.88 L (70%) FEV1 1.26 L (63%) FEV1/FVC 0.67 FEF 25-75 0.68 L (43%) negative bronchodilator response 08/03/15: FVC 2.29 L (85%) FEV1 1.63 L (81%) FEV1/FVC 0.71 FEF 25-75 1.05 L (73%) positive bronchodilator response TLC 5.97 L (115%) RV 143% ERV 35% DLCO corrected 72% (Hgb 12.1) 10/10/11:FVC 2.21 L (77%) FEV1 1.57 L (74%) FEV1/FVC 0.71 FEF 25-75 1.10 L (64%)  IMAGING CXR PA/LAT 08/17/14 (previously reviewed by me): Hyperinflation with flattening of the hemidiaphragms bilaterally. No focal opacity. Hila are full bilaterally. Heart normal in size. No pleural effusion appreciated.   CT CHEST W/O 07/31/14 (previously reviewed by me): Radiology noted a 1.3cm right thyroid nodule. No pleural effusion or thickening. No pericardial effusion. No pathologic mediastinal adenopathy. 67mm LUL nodule stable since 2007 on my review. No other area of consolidation. Mild apical emphysematous changes. Bilateral upper lobe ground glass nodules as well as a ground glass nodule in right lower lobe. Areas of linear opacity consistent with scar tissue formation.  CARDIAC EKG (05/30/15):  Previously reviewed by me. Normal sinus rhythm. QTc 417 ms. Previous Q-wave in V3 has resolved. No obvious signs of ischemia. Patient does have early repolarization in V1 & V2 similar to prior EKG.  TTE (03/16/15): LV normal in size with EF 60-65%. Normal wall motion. Grade 1 diastolic dysfunction. LA & RA normal in size. RV normal in size and function. Pulmonary artery systolic pressure within normal range. Pulmonary artery normal in size. No aortic stenosis or regurgitation. No mitral stenosis or regurgitation. No pulmonic stenosis or regurgitation. Mild tricuspid regurgitation. No  pericardial effusion.  LABS 11/20/14  Eosinophils: 0.9    Assessment & Plan:  82 year old Caucasian female with underlying severe, persistent asthma.Symptomatically the patient remains uncontrolled with worsening symptoms. Despite maximal inhaler therapy at feel that we will need to reinitiate Xolair therapy to control the patient's underlying asthma. Her reflux is well-controlled at this time and I feel is not contributing. I instructed the patient to notify my office if she had any new breathing problems before her next appointment.   1. Severe, Persistent Asthma: Continuing Symbicort, Spiriva, & Singulair. Checking serum IgE, RAST panel, & CBC with differential. Anticipate the need to restart Xolair therapy. Checking spirometry with bronchodilator challenge at next appointment. Patient given prescription for prednisone 40 mg by mouth daily 4 days to use in the event that she continues to clinically worsen. Starting guaifenesin 600 mg by mouth twice a day. 2. Allergic Rhinitis: Continuing Allegra, Singulair, & Nasonex with 2 sprays each nostril once daily. 3. Bilateral Lower Extremity DVT: Indefinite treatment with Xarelto. 4. GERD w/ Known Hiatal Hernia: Asymptomatic on Nexium. No changes. 5. Health maintenance: Patient unable to take the influenza vaccine due to allergy. Also previously had a severe allergic reaction to pneumonia vaccine. 6. Follow-up: Return to clinic in 2-3 months or sooner if needed. I have  Sonia Baller. Ashok Cordia, M.D. [facility] Pager:  [phone] After 3pm or if no response, call [phone] 10:31 AM 10/31/2015   [place]

## 2015-10-31 NOTE — Patient Instructions (Signed)
   Continue taking your medications as prescribed  We are sending in a prescription for Prednisone. Start taking this if you get much worse and call me.  We will do a breathing test at your next appointment.  Start taking Mucinex (Guaifenesin) 600mg  twice daily. Drink plenty of water.  I will see you back in 2-3 months or sooner if needed.  TESTS ORDERED: 1. Spirometry was bronchodilator challenge at next appointment 2. Serum IgE, RAST panel, & CBC with differential today

## 2015-11-01 LAB — RESPIRATORY ALLERGY PROFILE REGION II ~~LOC~~
Allergen, Cedar tree, t12: <0.1 kU/L
Allergen, Comm Silver Birch, t9: <0.1 kU/L
Allergen, Cottonwood, t14: <0.1 kU/L
Allergen, Mouse Urine Protein, e78: <0.1 kU/L
Allergen, Mulberry, t76: <0.1 kU/L
Allergen, Oak,t7: <0.1 kU/L
Bermuda Grass: <0.1 kU/L
Box Elder IgE: <0.1 kU/L
Cat Dander: <0.1 kU/L
Cladosporium Herbarum: <0.1 kU/L
Cockroach: <0.1 kU/L
Dog Dander: <0.1 kU/L
Elm IgE: <0.1 kU/L
IgE (Immunoglobulin E), Serum: 425 kU/L — ABNORMAL HIGH (ref <115)

## 2015-11-05 ENCOUNTER — Telehealth: Payer: Self-pay | Admitting: Internal Medicine

## 2015-11-05 NOTE — Telephone Encounter (Signed)
Called spoke with pt. She states that she needs to speak with Joellen Jersey about her xolair injections. I explained that Joellen Jersey was out of the office today and would return her call when she returned. She voiced understanding and had no further questions.  Will send message to [facility]

## 2015-11-07 ENCOUNTER — Other Ambulatory Visit: Payer: Self-pay | Admitting: Pulmonary Disease

## 2015-11-12 ENCOUNTER — Telehealth: Payer: Self-pay | Admitting: Pulmonary Disease

## 2015-11-12 MED ORDER — MOMETASONE FUROATE 50 MCG/ACT NA SUSP: NASAL | Status: DC

## 2015-11-12 NOTE — Telephone Encounter (Signed)
Pt states [name] wanted to get her back on Xolair. Pt is aware that I am working on this for her.

## 2015-11-12 NOTE — Telephone Encounter (Signed)
Pt needs refill on nasal spray nasonex. This has been sent in. Nothing further needed

## 2015-11-14 ENCOUNTER — Telehealth: Payer: Self-pay | Admitting: Internal Medicine

## 2015-11-14 NOTE — Telephone Encounter (Signed)
Pt is aware that I am working on this for her and understands we have to get JN to sign forms. Thanks.

## 2015-11-14 NOTE — Telephone Encounter (Signed)
Spoke with pt. States that [name] is putting her back on Xolair. Pt wants to speak to [facility] about this. Will route message to [facility] to follow up on this.

## 2015-11-16 NOTE — Telephone Encounter (Signed)
Patient returned call, [phone].

## 2015-11-16 NOTE — Telephone Encounter (Signed)
Pt calling to check on Xolair status.

## 2015-11-16 NOTE — Telephone Encounter (Signed)
lmtcb x1 for pt. Will route message to [facility] for follow up.

## 2015-11-16 NOTE — Telephone Encounter (Signed)
Please let patient know that JN has to sign the forms-they will not accept a stamped signature-JN will be back on Friday 11-23-15. Then send message to [facility] who will follow Xolair process for patient as JN's nurse.

## 2015-11-16 NOTE — Telephone Encounter (Signed)
Spoke with pt. She is aware that we are waiting on JN to sign these forms. Will route back to [facility] for follow up.

## 2015-11-26 NOTE — Telephone Encounter (Signed)
Dr. Ashok Cordia, do you still have this form?

## 2015-11-26 NOTE — Telephone Encounter (Signed)
Don't have them. They are likely still in my folder.

## 2015-11-28 DIAGNOSIS — M542 Cervicalgia: Secondary | ICD-10-CM | POA: Diagnosis not present

## 2015-11-28 DIAGNOSIS — M50322 Other cervical disc degeneration at C5-C6 level: Secondary | ICD-10-CM | POA: Diagnosis not present

## 2015-12-03 NOTE — Telephone Encounter (Signed)
Forms are in JN's look at and Mindy is aware that JN's signature is needed.  Called spoke with pt. I informed her that the forms are in JN's look at and Durene Cal has returned to office today. I explained to her that the forms will be signed and faxed today. She voiced understanding and had no further questions.

## 2015-12-03 NOTE — Telephone Encounter (Signed)
Form signed & with Mindy's stack.

## 2015-12-03 NOTE — Telephone Encounter (Signed)
Patient calling to check status of forms for her to get xolair, patient states she is more SOB without it and is having trouble breathing this morning, please advise [phone]

## 2015-12-03 NOTE — Telephone Encounter (Signed)
Form is in [place] look at. Please advise once signed. Thanks

## 2015-12-04 DIAGNOSIS — L308 Other specified dermatitis: Secondary | ICD-10-CM | POA: Diagnosis not present

## 2015-12-04 NOTE — Telephone Encounter (Signed)
Forms have been faxed back. Will await response

## 2015-12-06 NOTE — Telephone Encounter (Signed)
Pt is coming into the office to sign the PAN form needed. Once done. Will fax this back to [employer],

## 2015-12-06 NOTE — Telephone Encounter (Signed)
PAN form signed and faxed back

## 2015-12-13 ENCOUNTER — Telehealth: Payer: Self-pay | Admitting: Pulmonary Disease

## 2015-12-13 DIAGNOSIS — J4550 Severe persistent asthma, uncomplicated: Secondary | ICD-10-CM

## 2015-12-13 MED ORDER — PREDNISONE 20 MG PO TABS: ORAL_TABLET | ORAL | 0 refills | Status: DC

## 2015-12-13 NOTE — Telephone Encounter (Signed)
Spoke with the pt  She is requesting that Hiram call her in some prednisone  She c/o increased cough for the past 3 days- prod occ with clear sputum  She states also having increased DOE and chest tightness  She is aware that we are working on her Xolair  Please advise thanks!

## 2015-12-13 NOTE — Telephone Encounter (Signed)
Do another Rx for Prednisone 40mg  daily x 6 days. Thanks.

## 2015-12-13 NOTE — Telephone Encounter (Signed)
Rx was sent and pt aware  Nothing further needed 

## 2015-12-14 ENCOUNTER — Telehealth: Payer: Self-pay | Admitting: Pulmonary Disease

## 2015-12-14 ENCOUNTER — Other Ambulatory Visit: Payer: Self-pay | Admitting: *Deleted

## 2015-12-14 MED ORDER — OMALIZUMAB 150 MG ~~LOC~~ SOLR: 225.0000 mg | SUBCUTANEOUS | 5 refills | Status: DC

## 2015-12-14 NOTE — Telephone Encounter (Signed)
PA for Arvid Right was approved from 11/14/15-12/13/16 through [employer] D. Approval # [id number]. I called [employer] and made aware of approval

## 2015-12-14 NOTE — Telephone Encounter (Deleted)
This message will be routed to [facility] to set up shipment of Xolair.

## 2015-12-14 NOTE — Telephone Encounter (Signed)
Katie or lindsay   Does either of you know about this pt starting back on xolair?  [facility] is calling to set up the shipment but I am not familiar with this process.  Please advise. thanks

## 2015-12-14 NOTE — Telephone Encounter (Signed)
This message will be sent to [facility] to set up the first shipment. We can't make the pt's first appointment until we know when the shipment will be arriving. Tammy once this is set up, send message to [place] with the shipment date so she can contact the pt to set up her appointment.

## 2015-12-14 NOTE — Telephone Encounter (Signed)
#   vials:4 Ordered date:12/14/15 Shipping Date:12/17/15

## 2015-12-14 NOTE — Telephone Encounter (Signed)
The first xolair visit no. The nurses have to make the pt's first appt. And tell them how to use Epi-pen and call it in or send the rx electronically. But there after yes.

## 2015-12-14 NOTE — Telephone Encounter (Signed)
Ria Comment is currently working on [place]. Will sign off

## 2015-12-14 NOTE — Telephone Encounter (Signed)
The reason I don't make the first shipment is if  I don't have the paperwork  I can't verify anything,( does Dr. Jenne [name] #) unless you give me a copy of the paperwork. I'm guessing and hoping she on 225mg  this time. Her Arvid Right will be here 12/18/15.

## 2015-12-14 NOTE — Telephone Encounter (Signed)
tammy do you set this shipment up with [facility] for the xolair shipments?  Please advise. thanks

## 2015-12-17 NOTE — Telephone Encounter (Signed)
My response is in the previous note. I routed it to [facility] Fri.. Nothing further needed. ( On my end until the pt. Comes in.)

## 2015-12-17 NOTE — Telephone Encounter (Signed)
Pt xolair is scheduled to be here on 12/18/15. Please hold until medication is arrived. Once medication is received, then patient can be scheduled for appt. Pt will need to wait 2 hrs on first injection. Ensure pt has her epi pen when coming to get xolair.

## 2015-12-18 NOTE — Telephone Encounter (Signed)
Pt cb [phone]

## 2015-12-18 NOTE — Telephone Encounter (Signed)
Spoke with pt and scheduled Xolair injection appt. Pt aware to bring Epipen and wait 2 hours. Nothing further needed.

## 2015-12-18 NOTE — Telephone Encounter (Signed)
#   Vials:4 Arrival Date:12/18/15 Lot [id number] Exp Date:12/20

## 2015-12-18 NOTE — Telephone Encounter (Signed)
LMOMTCB x1 Pt xolair has been received.  patient can be scheduled for appt. Pt will need to wait 2 hrs on first injection. Ensure pt has her epi pen when coming to get [employer]

## 2015-12-19 ENCOUNTER — Ambulatory Visit (INDEPENDENT_AMBULATORY_CARE_PROVIDER_SITE_OTHER): Payer: Medicare Other

## 2015-12-19 DIAGNOSIS — J4540 Moderate persistent asthma, uncomplicated: Secondary | ICD-10-CM | POA: Diagnosis not present

## 2015-12-19 MED ORDER — OMALIZUMAB 150 MG ~~LOC~~ SOLR
225.0000 mg | SUBCUTANEOUS | Status: DC
  Administered 2015-12-19: 225 mg via SUBCUTANEOUS

## 2015-12-24 DIAGNOSIS — I8311 Varicose veins of right lower extremity with inflammation: Secondary | ICD-10-CM | POA: Diagnosis not present

## 2015-12-24 DIAGNOSIS — I8312 Varicose veins of left lower extremity with inflammation: Secondary | ICD-10-CM | POA: Diagnosis not present

## 2015-12-24 DIAGNOSIS — I872 Venous insufficiency (chronic) (peripheral): Secondary | ICD-10-CM | POA: Diagnosis not present

## 2015-12-28 ENCOUNTER — Telehealth: Payer: Self-pay | Admitting: Pulmonary Disease

## 2015-12-28 NOTE — Telephone Encounter (Signed)
Called and spoke with [name] with [facility], advised that we have enough medication for her injection on 8/23 and that we will call to schedule shipment after her following injection is scheduled.  Nothing further needed.

## 2016-01-01 DIAGNOSIS — M542 Cervicalgia: Secondary | ICD-10-CM | POA: Diagnosis not present

## 2016-01-02 ENCOUNTER — Ambulatory Visit (INDEPENDENT_AMBULATORY_CARE_PROVIDER_SITE_OTHER): Payer: Medicare Other

## 2016-01-02 DIAGNOSIS — J4540 Moderate persistent asthma, uncomplicated: Secondary | ICD-10-CM

## 2016-01-02 MED ORDER — OMALIZUMAB 150 MG ~~LOC~~ SOLR
225.0000 mg | Freq: Once | SUBCUTANEOUS | Status: AC
  Administered 2016-01-02: 225 mg via SUBCUTANEOUS

## 2016-01-07 ENCOUNTER — Telehealth: Payer: Self-pay | Admitting: Pulmonary Disease

## 2016-01-07 NOTE — Telephone Encounter (Signed)
#   vials:4 Ordered date:01/07/16 Shipping Date:01/09/16

## 2016-01-07 NOTE — Telephone Encounter (Signed)
Spoke with Autumn from [place] is set up for delivery on 01/09/16

## 2016-01-08 DIAGNOSIS — M50121 Cervical disc disorder at C4-C5 level with radiculopathy: Secondary | ICD-10-CM | POA: Diagnosis not present

## 2016-01-08 DIAGNOSIS — M50123 Cervical disc disorder at C6-C7 level with radiculopathy: Secondary | ICD-10-CM | POA: Diagnosis not present

## 2016-01-08 DIAGNOSIS — M50322 Other cervical disc degeneration at C5-C6 level: Secondary | ICD-10-CM | POA: Diagnosis not present

## 2016-01-10 NOTE — Telephone Encounter (Signed)
#   Vials:4 Arrival Date:01/10/16 Lot [id number] Exp Date:3/21

## 2016-01-16 ENCOUNTER — Ambulatory Visit (INDEPENDENT_AMBULATORY_CARE_PROVIDER_SITE_OTHER): Payer: Medicare Other | Admitting: Pulmonary Disease

## 2016-01-16 ENCOUNTER — Ambulatory Visit: Payer: Medicare Other | Admitting: *Deleted

## 2016-01-16 VITALS — BP 122/70 | HR 87 | Ht 64.75 in | Wt 155.0 lb

## 2016-01-16 DIAGNOSIS — J4540 Moderate persistent asthma, uncomplicated: Secondary | ICD-10-CM | POA: Diagnosis not present

## 2016-01-16 DIAGNOSIS — J4550 Severe persistent asthma, uncomplicated: Secondary | ICD-10-CM

## 2016-01-16 DIAGNOSIS — K219 Gastro-esophageal reflux disease without esophagitis: Secondary | ICD-10-CM | POA: Diagnosis not present

## 2016-01-16 DIAGNOSIS — J309 Allergic rhinitis, unspecified: Secondary | ICD-10-CM | POA: Diagnosis not present

## 2016-01-16 DIAGNOSIS — I82403 Acute embolism and thrombosis of unspecified deep veins of lower extremity, bilateral: Secondary | ICD-10-CM | POA: Diagnosis not present

## 2016-01-16 DIAGNOSIS — K449 Diaphragmatic hernia without obstruction or gangrene: Secondary | ICD-10-CM

## 2016-01-16 LAB — PULMONARY FUNCTION TEST
FEF 25-75 Post: 2.18 L/s
FEF 25-75 Pre: 0.92 L/s
FEF2575-%Change-Post: 138 %
FEF2575-%Pred-Post: 155 %
FEF2575-%Pred-Pre: 65 %
FEV1-%Change-Post: 26 %
FEV1-%Pred-Post: 98 %
FEV1-%Pred-Pre: 78 %
FEV1-Post: 1.95 L
FEV1-Pre: 1.54 L
FEV1FVC-%Change-Post: 10 %
FEV1FVC-%Pred-Pre: 92 %
FEV6-%Change-Post: 15 %
FEV6-%Pred-Post: 102 %
FEV6-%Pred-Pre: 88 %
FEV6-Post: 2.57 L
FEV6-Pre: 2.23 L
FEV6FVC-%Change-Post: 1 %
FEV6FVC-%Pred-Post: 106 %
FEV6FVC-%Pred-Pre: 104 %
FVC-%Change-Post: 14 %
FVC-%Pred-Post: 96 %
FVC-%Pred-Pre: 84 %
FVC-Post: 2.57 L
FVC-Pre: 2.26 L
Post FEV1/FVC ratio: 76 %
Post FEV6/FVC ratio: 100 %
Pre FEV1/FVC ratio: 68 %
Pre FEV6/FVC Ratio: 99 %

## 2016-01-16 MED ORDER — BUDESONIDE-FORMOTEROL FUMARATE 160-4.5 MCG/ACT IN AERO: INHALATION_SPRAY | RESPIRATORY_TRACT | 3 refills | Status: DC

## 2016-01-16 MED ORDER — TIOTROPIUM BROMIDE MONOHYDRATE 2.5 MCG/ACT IN AERS: 2.0000 | INHALATION_SPRAY | Freq: Every day | RESPIRATORY_TRACT | 3 refills | Status: DC

## 2016-01-16 MED ORDER — OMALIZUMAB 150 MG ~~LOC~~ SOLR
225.0000 mg | SUBCUTANEOUS | Status: DC
  Administered 2016-01-16: 225 mg via SUBCUTANEOUS

## 2016-01-16 NOTE — Progress Notes (Signed)
Subjective:    Patient ID: [name], female    DOB: Jul 22, 1932, 82 y.o.   MRN: [id number]  C.C.:  Follow-up with Severe, Persistent Asthma, Allergic Rhinitis, DVT, & GERD w/ Hiatal Hernia.  HPI Severe, Persistent Asthma:  Currently on Spiriva, Symbicort, and Singulair. Patient has been treated with repeat courses of prednisone with last on 12/13/15. She reports she was recently on Prednisone with some neck discomfort. Restarted on Xolair since last appointment. She reports no adverse effects from her Xolair treatments. She reports she has only been wheezing intermittently with the weather changes. She does wake up intermittently throughout the week coughing. She does report improvement in her dyspnea, coughing, wheezing, and rescue inhaler use since having restarted Xolair.  Allergic Rhinitis:  On Nasonex, Singulair & Allegra. She reports she does have occasional sinus congestion & drainage.   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 On Xarelto indefinitely.  No hematuria or hematochezia. No melena.   GERD w/ Hiatal Hernia:  Prescribed Nexium & sleeping with head of bed elevated. No nausea or emesis. No reflux or morning brash water taste.   Review of Systems She has been having intermittent chest tightness & pressure. It does sometimes wake her up at night along with her coughing. Denies any frank chest pain. No fever, chills, or sweats.   Allergies  Allergen Reactions  . Eggs Or Egg-Derived Products     RAW EGGS.. Can eat cooked eggs  . Influenza Vaccines   . Nucala [Mepolizumab]   . Cephalosporins Rash  . Codeine Rash  . Levofloxacin Rash  . Penicillins Rash  . Pneumococcal Vaccines Rash    Current Outpatient Prescriptions on File Prior to Visit  Medication Sig Dispense Refill  . albuterol (PROAIR HFA) 108 (90 BASE) MCG/ACT inhaler Inhale 2 puffs into the lungs every 6 (six) hours as needed for wheezing or shortness of breath. 18  g 4  . albuterol (PROVENTIL) (2.5 MG/3ML) 0.083% nebulizer solution Take 3 mLs (2.5 mg total) by nebulization every 6 (six) hours as needed for wheezing or shortness of breath. Dx: J45.41 360 mL 6  . benzonatate (TESSALON) 100 MG capsule Take 1 capsule (100 mg total) by mouth 3 (three) times daily as needed for cough. 100 capsule 1  . Calcium Carbonate-Vitamin D (CALTRATE 600+D) 600-400 MG-UNIT per tablet Take 1 tablet by mouth daily.     . chlorpheniramine (CHLOR-TRIMETON) 4 MG tablet Take by mouth. 4 mg in the morning and 8 mg at bedtime    . Cholecalciferol (VITAMIN D3) 1000 UNITS tablet Take 1,000 Units by mouth daily.      . clindamycin (CLEOCIN) 300 MG capsule Take 300 mg by mouth. Before dental work    . dextromethorphan (DELSYM) 30 MG/5ML liquid Take 60 mg by mouth 2 (two) times daily as needed for cough.    . diclofenac sodium (VOLTAREN) 1 % GEL Apply topically as needed.    [name] EPINEPHrine 0.3 mg/0.3 mL IJ SOAJ injection Inject 0.3 mLs (0.3 mg total) into the muscle once. 2 Device 1  . esomeprazole (NEXIUM) 40 MG capsule Take 1 capsule (40 mg total) by mouth 2 (two) times daily. 180 capsule 3  . fexofenadine (ALLEGRA) 180 MG tablet Take 180 mg by mouth daily.      . furosemide (LASIX) 40 MG tablet Take 0.5 tablets (20 mg total) by mouth every morning. 45 tablet 1  . mometasone (NASONEX) 50 MCG/ACT nasal spray USE  2 SPRAYS NASALLY once daily 51 g 4  . montelukast (SINGULAIR) 10 MG tablet TAKE 1 TABLET AT BEDTIME 90 tablet 1  . Multiple Vitamin (MULTIVITAMIN) tablet Take 1 tablet by mouth daily.    . potassium chloride (K-DUR) 10 MEQ tablet TAKE 1 TABLET DAILY (Patient taking differently: TAKE 1 TABLET BY MOUTH  DAILY) 90 tablet 2  . pramipexole (MIRAPEX) 0.125 MG tablet Take 0.375 mg by mouth at bedtime.     . rosuvastatin (CRESTOR) 5 MG tablet TAKE 1 TABLET AT BEDTIME 90 tablet 2  . Spacer/Aero-Holding Chambers (AEROCHAMBER MV) inhaler by Other route. Use as instructed     . SYMBICORT  160-4.5 MCG/ACT inhaler INHALE 2 PUFFS INTO THE LUNGS TWICE A DAY (Patient taking differently: INHALE 1-2 PUFF INTO THE LUNGS TWICE A DAY) 3 Inhaler 3  . telmisartan (MICARDIS) 20 MG tablet Take 1 tablet (20 mg total) by mouth at bedtime. 90 tablet 3  . Tiotropium Bromide Monohydrate (SPIRIVA RESPIMAT) 2.5 MCG/ACT AERS Inhale 1 puff into the lungs daily. 1 Inhaler 5  . triamcinolone cream (KENALOG) 0.5 % Apply 1 application topically 3 (three) times daily as needed.    . trolamine salicylate (ASPERCREME) 10 % cream Apply 1 application topically 2 (two) times daily as needed (pain).     . vitamin B-12 (CYANOCOBALAMIN) 500 MCG tablet Take 500 mcg by mouth daily.      [name] Reasons 20 MG TABS tablet Take 20 mg by mouth daily.      No current facility-administered medications on file prior to visit.     Past Medical History:  Diagnosis Date  . Asthma, severe persistent    pulmologist-- dr Joya Gaskins  . Chronic kidney disease, stage II (mild)   . DDD (degenerative disc disease), cervical   . Diverticulosis   . Edema, lower extremity   . GERD (gastroesophageal reflux disease)   . History of breast cancer    1989  S/P   RIGHT MASTECTOMY ;  NO CHEMORADIATION //   NO RECURRENCE  . History of DVT of lower extremity   . History of shingles   . Hyperlipidemia   . Leg ulcer, left ([place])   . Osteoporosis, unspecified    Knee and hip osteoarthritis bilaterally  . Perennial allergic rhinitis   . Restless legs syndrome (RLS)   . Unspecified essential hypertension     Past Surgical History:  Procedure Laterality Date  . CARDIAC CATHETERIZATION  08/ 01/ 2008   dr Cathie Olden   normal -- EF of 65%  . CARDIOVASCULAR STRESS TEST  05-22-2011   dr Cathie Olden   normal lexiscan perfusion study/  no ischemia/  lvsf  86%  . CATARACT EXTRACTION W/ INTRAOCULAR LENS  IMPLANT, BILATERAL    . CHOLECYSTECTOMY  1993  . INCISION AND DRAINAGE OF WOUND Left 10/24/2013   Procedure: IRRIGATION AND DEBRIDEMENT OF LEFT LEG WITH  PLACEMENT OF ACELL AND WOUND VAC;  Surgeon: Theodoro Kos, DO;  Location: [facility];  Service: Plastics;  Laterality: Left;  . Effort  . MASTECTOMY Bilateral rigth 1989///   left  1990   breast cancer 1989//   fibrocytic disease 1990  . TOTAL ABDOMINAL HYSTERECTOMY W/ BILATERAL SALPINGOOPHORECTOMY  1989   done at same time as right mastectomy  . TOTAL KNEE ARTHROPLASTY Right 10/05/2012   Procedure: RIGHT TOTAL KNEE ARTHROPLASTY;  Surgeon: Mauri Pole, MD;  Location: [facility];  Service: Orthopedics;  Laterality: Right;  Family History  Problem Relation Age of Onset  . Heart attack Mother   . Asthma Mother   . Heart disease Mother   . Hyperlipidemia Mother   . Heart attack Father   . Heart failure Father   . Deep vein thrombosis Father   . Heart disease Father   . Peripheral vascular disease Father     amputation  . Cancer Brother   . Asthma Brother   . Coronary artery disease Sister   . Heart disease Sister     Social History   Social History  . Marital status: Married    Spouse name: N/A  . Number of children: N/A  . Years of education: N/A   Social History Main Topics  . Smoking status: Never Smoker  . Smokeless tobacco: Never Used  . Alcohol use No  . Drug use: No  . Sexual activity: Not on file   Other Topics Concern  . Not on file   Social History Narrative   Originally from [place]. Always lived in [place]. Has traveled across the country to [place]. 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 No known asbestos exposure (brother with mesothelioma). No pets currently. No bird exposure. No mold exposure. Carpet has been removed from her home.       Objective:   Physical Exam Ht 5' 4.75" (1.645 m)   Wt 155 lb (70.3 kg)   BMI 25.99 kg/m  General:  Elderly female.No distress. Alert. Integument:  Warm & dry. No rash on exposed skin.  HEENT:  Moist mucus membranes. Mild nasal turbinate  swelling unchanged. No oral ulcers. Cardiovascular:  Regular rate. 3/6 Murmur appreciated. No appreciable JVD.  Pulmonary: Clear on auscultation bilaterally with good aeration. Normal work of breathing on room air. Abdomen: Soft. Normal bowel sounds. Mildly protuberant.  Musculoskeletal:  Normal bulk and tone. No joint effusion appreciated.  PFT 01/16/16: FVC 2.26 L (84%) FEV1 1.54 L (78%) FEV1/FVC 0.68 FEF 25-75 0.92 L (65%) positive bronchodilator response 10/31/15: FVC 1.88 L (70%) FEV1 1.26 L (63%) FEV1/FVC 0.67 FEF 25-75 0.68 L (43%) negative bronchodilator response 08/03/15: FVC 2.29 L (85%) FEV1 1.63 L (81%) FEV1/FVC 0.71 FEF 25-75 1.05 L (73%) positive bronchodilator response TLC 5.97 L (115%) RV 143% ERV 35% DLCO corrected 72% (Hgb 12.1) 10/10/11:FVC 2.21 L (77%) FEV1 1.57 L (74%) FEV1/FVC 0.71 FEF 25-75 1.10 L (64%)  IMAGING CXR PA/LAT 08/17/14 (previously reviewed by me): Hyperinflation with flattening of the hemidiaphragms bilaterally. No focal opacity. Hila are full bilaterally. Heart normal in size. No pleural effusion appreciated.   CT CHEST W/O 07/31/14 (previously reviewed by me): Radiology noted a 1.3cm right thyroid nodule. No pleural effusion or thickening. No pericardial effusion. No pathologic mediastinal adenopathy. 55mm LUL nodule stable since 2007 on my review. No other area of consolidation. Mild apical emphysematous changes. Bilateral upper lobe ground glass nodules as well as a ground glass nodule in right lower lobe. Areas of linear opacity consistent with scar tissue formation.  CARDIAC EKG (05/30/15):  Previously reviewed by me. Normal sinus rhythm. QTc 417 ms. Previous Q-wave in V3 has resolved. No obvious signs of ischemia. Patient does have early repolarization in V1 & V2 similar to prior EKG.  TTE (03/16/15): LV normal in size with EF 60-65%. Normal wall motion. Grade 1 diastolic dysfunction. LA & RA normal in size. RV normal in size and function. Pulmonary artery  systolic pressure within normal range. Pulmonary artery normal in size. No aortic  stenosis or regurgitation. No mitral stenosis or regurgitation. No pulmonic stenosis or regurgitation. Mild tricuspid regurgitation. No pericardial effusion.  LABS 10/31/15 CBC: 5.1/12.7/38.8/2:30 Eosinophil: 0.3 IgE: 425 RAST panel: Negative  11/20/14 Eosinophils: 0.9    Assessment & Plan:  82 y.o. Caucasian female with underlying severe, persistent asthma.Patient has already begun to have significant improvement in control of her underlying asthma since having restarted Xolair. Her rescue inhaler use has decreased and her spirometry on my review has significantly improved. She does continue to exhibit a significant bronchodilator response therefore I do not feel be escalating inhaled medication therapy at this time is indicated. She is continuing on systemic anticoagulation for her bilateral lower extremity DVTs with Xarelto and no signs of active bleeding. Her allergic rhinitis seems to be reasonably well controlled at this time and I'm hopeful that with continued Xolair therapy this too will improve. I instructed the patient to contact my office if she had any new breathing problems or questions before her next appointment as I would be happy to see her sooner.  1. Severe, Persistent Asthma: Continuing Symbicort, Spiriva, & Singulair. Continuing Xolair as prescribed. Repeat spirometry with bronchodilator challenge at next appointment. 2. Allergic Rhinitis: Continuing Allegra, Singulair, & Nasonex with 2 sprays each nostril once daily. No changes. 3. Bilateral Lower Extremity DVT: Indefinite treatment with Xarelto. No changes. 4. GERD w/ Known Hiatal Hernia: Continuing Nexium without change. 5. Health maintenance: Patient unable to take the influenza vaccine due to allergy. Also previously had a severe allergic reaction to pneumonia vaccine. 6. Follow-up: Return to clinic in 3 months or sooner if  needed.  Sonia Baller Ashok Cordia, M.D. [facility] Pager:  [phone] After 3pm or if no response, call [phone] 10:27 AM 01/16/16

## 2016-01-16 NOTE — Progress Notes (Signed)
Result reviewed. 

## 2016-01-16 NOTE — Patient Instructions (Signed)
   Continue using your inhalers and medications as prescribed.  Call me if you have any new breathing problems or questions before your next appointment.  I will see you back in 3 months with repeat breathing tests at that time. Call me if you need to be seen sooner than that.  TESTS ORDERED: 1. Spirometry with bronchodilator challenge at next appointment

## 2016-01-16 NOTE — Addendum Note (Signed)
Addended by: Len Blalock on: 01/16/2016 12:34 PM   Modules accepted: Orders

## 2016-01-17 ENCOUNTER — Other Ambulatory Visit: Payer: Self-pay | Admitting: Cardiovascular Disease

## 2016-01-30 ENCOUNTER — Ambulatory Visit (INDEPENDENT_AMBULATORY_CARE_PROVIDER_SITE_OTHER): Payer: Medicare Other

## 2016-01-30 DIAGNOSIS — J4540 Moderate persistent asthma, uncomplicated: Secondary | ICD-10-CM

## 2016-01-30 MED ORDER — OMALIZUMAB 150 MG ~~LOC~~ SOLR
225.0000 mg | SUBCUTANEOUS | Status: DC
  Administered 2016-01-30: 225 mg via SUBCUTANEOUS

## 2016-01-31 ENCOUNTER — Telehealth: Payer: Self-pay | Admitting: Pulmonary Disease

## 2016-01-31 MED ORDER — PREDNISONE 20 MG PO TABS: 40.0000 mg | ORAL_TABLET | Freq: Every day | ORAL | 0 refills | Status: DC

## 2016-01-31 NOTE — Telephone Encounter (Signed)
Please send in a Rx for Prednisone 40mg  daily x6 days. Let me know if she reports any fever, chills, sweats, or cough producing a discolored mucus. Thanks.

## 2016-01-31 NOTE — Telephone Encounter (Signed)
Pt aware of recs, rx sent in to preferred pharmacy.  Nothing further needed.

## 2016-01-31 NOTE — Telephone Encounter (Signed)
Called spoke with pt. She is requesting Rx for prednisone. She c/o increase SOB, wheezing, cough, chest tightness. Please advise Dr. Ashok Cordia thanks

## 2016-02-04 ENCOUNTER — Telehealth: Payer: Self-pay | Admitting: Pulmonary Disease

## 2016-02-04 NOTE — Telephone Encounter (Signed)
#   vials:4 Ordered date:02/04/16 Shipping Date:02/06/16

## 2016-02-07 NOTE — Telephone Encounter (Signed)
#   Vials:4 Arrival Date:02/07/16 Lot [id number] Exp Date:4/21

## 2016-02-13 ENCOUNTER — Ambulatory Visit (INDEPENDENT_AMBULATORY_CARE_PROVIDER_SITE_OTHER): Payer: Medicare Other

## 2016-02-13 DIAGNOSIS — J4550 Severe persistent asthma, uncomplicated: Secondary | ICD-10-CM | POA: Diagnosis not present

## 2016-02-13 MED ORDER — OMALIZUMAB 150 MG ~~LOC~~ SOLR
150.0000 mg | SUBCUTANEOUS | Status: DC
  Administered 2016-02-13: 150 mg via SUBCUTANEOUS

## 2016-02-21 ENCOUNTER — Other Ambulatory Visit: Payer: Self-pay | Admitting: Cardiovascular Disease

## 2016-02-25 ENCOUNTER — Ambulatory Visit (INDEPENDENT_AMBULATORY_CARE_PROVIDER_SITE_OTHER): Payer: Medicare Other | Admitting: Pulmonary Disease

## 2016-02-25 ENCOUNTER — Encounter: Payer: Self-pay | Admitting: Pulmonary Disease

## 2016-02-25 ENCOUNTER — Telehealth: Payer: Self-pay | Admitting: Pulmonary Disease

## 2016-02-25 VITALS — BP 140/72 | HR 92 | Temp 97.7°F | Ht 64.75 in | Wt 156.0 lb

## 2016-02-25 DIAGNOSIS — J4551 Severe persistent asthma with (acute) exacerbation: Secondary | ICD-10-CM

## 2016-02-25 DIAGNOSIS — G2581 Restless legs syndrome: Secondary | ICD-10-CM

## 2016-02-25 DIAGNOSIS — J301 Allergic rhinitis due to pollen: Secondary | ICD-10-CM | POA: Diagnosis not present

## 2016-02-25 DIAGNOSIS — I82403 Acute embolism and thrombosis of unspecified deep veins of lower extremity, bilateral: Secondary | ICD-10-CM

## 2016-02-25 DIAGNOSIS — R911 Solitary pulmonary nodule: Secondary | ICD-10-CM

## 2016-02-25 DIAGNOSIS — K219 Gastro-esophageal reflux disease without esophagitis: Secondary | ICD-10-CM

## 2016-02-25 MED ORDER — METHYLPREDNISOLONE ACETATE 80 MG/ML IJ SUSP
80.0000 mg | Freq: Once | INTRAMUSCULAR | Status: AC
  Administered 2016-02-25: 80 mg via INTRAMUSCULAR

## 2016-02-25 MED ORDER — METHYLPREDNISOLONE 4 MG PO TABS: ORAL_TABLET | ORAL | 0 refills | Status: DC

## 2016-02-25 NOTE — Progress Notes (Signed)
Subjective:    Patient ID: [name], female    DOB: 02/11/1933, 82 y.o.   MRN: [id number]   ~  January 16, 2016:  68mo ROV w/ JN>   C.C.:  Follow-up with Severe, Persistent Asthma, Allergic Rhinitis, DVT, & GERD w/ Hiatal Hernia. HPI    1) Severe, Persistent Asthma:  Currently on Spiriva, Symbicort, and Singulair. Patient has been treated with repeat courses of prednisone with last on 12/13/15. She reports she was recently on Prednisone with some neck discomfort. Restarted on Xolair since last appointment. She reports no adverse effects from her Xolair treatments. She reports she has only been wheezing intermittently with the weather changes. She does wake up intermittently throughout the week coughing. She does report improvement in her dyspnea, coughing, wheezing, and rescue inhaler use since having restarted Xolair.    2) Allergic Rhinitis:  On Nasonex, Singulair & Allegra. She reports she does have occasional sinus congestion & drainage.     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 On Xarelto indefinitely.  No hematuria or hematochezia. No melena.     4) GERD w/ Hiatal Hernia:  Prescribed Nexium & sleeping with head of bed elevated. No nausea or emesis. No reflux or morning brash water taste. IMP/PLAN >>     29 y.o. Caucasian female with underlying severe, persistent asthma.Patient has already begun to have significant improvement in control of her underlying asthma since having restarted Xolair. Her rescue inhaler use has decreased and her spirometry on my review has significantly improved. She does continue to exhibit a significant bronchodilator response therefore I do not feel be escalating inhaled medication therapy at this time is indicated. She is continuing on systemic anticoagulation for her bilateral lower extremity DVTs with Xarelto and no signs of active bleeding. Her allergic rhinitis seems to be reasonably well controlled at this  time and I'm hopeful that with continued Xolair therapy this too will improve. I instructed the patient to contact my office if she had any new breathing problems or questions before her next appointment as I would be happy to see her sooner. 1. Severe, Persistent Asthma: Continuing Symbicort, Spiriva, & Singulair. Continuing Xolair as prescribed. Repeat spirometry with bronchodilator challenge at next appointment. 2. Allergic Rhinitis: Continuing Allegra, Singulair, & Nasonex with 2 sprays each nostril once daily. No changes. 3. Bilateral Lower Extremity DVT: Indefinite treatment with Xarelto. No changes. 4. GERD w/ Known Hiatal Hernia: Continuing Nexium without change. 5. Health maintenance: Patient unable to take the influenza vaccine due to allergy. Also previously had a severe allergic reaction to pneumonia vaccine. 6. Follow-up: Return to clinic in 3 months or sooner if needed.  ~  February 25, 2016:  Add-on appt w/ SN>      89 y/o WF w/ severe persistent asthma expertly managed by DrNestor on Xolair monthly, Symbicort160-2spBid, Spiriva Respimat-2sp once daily, Albuterol by NEB or HFA for rescue prn, Singulair10, Allegra180, NasonexQhs, Tessalon prn;  She was stable until about 3d ago w/ onset wheezing, chest congestion, tightness, cough w/ min clear sput- no discoloration or blood, & no f/c/s;  She denies CP, palpit, edema, but does feel SOB w/ difficulty both getting the air "IN" and "out"; she wants to blame the weather changes for this turn of events as she has not been exposed to any relatives URI/ bronchitis/ etc... NOTE: she was last on Pred in Sept- she called the day after her xolair shot & was  given Pred40mg  x6d per [facility]... She tells me that when she started her NEB for this exac that she stopped the Symbicort 7 Spiriva since she thought she couldn't take them together...    EXAM shows Afeb, VSS, O2sat=94% on RA;  HEENT- neg, mallampati1;  Chest- diffuse exp wheezing & scat rhonchi, no  rales or consolidation;  Heart- RR w/o m/r/g;  Abd- soft, min epig tender, w/o megaly/masses;  Ext- neg w/o c/c/e;  Neuro- intact w/o focal deficits, she is anxious...   Last CXR 08/17/15>  Norm heart size, sl hyperinflation, min scarring at right lung base...  Last Spirometry>  FVC=2.26 (84%), FEV1=1.54 (78%), %1sec=68%;  Post-bronchodil FEV1 improved 26% to 1.95L  Last Labs 10/2015>  Chems- ok x Cr=1.34;  CBC- wnl w/ Hg=12.7, WBC=5.1, but Eos=6.7%;  RAST panel- NEG but IgE=425 IMP/PLAN>>  Asthma exac- ?ppt by the change in weather, ?reflux related;  We discussed her med regimen & I've asked her to use the NEB w/ Albut Tid (morn, mid-day, eve) followed after ~17min by the Symbicort160-2sp morn & eve and the Spiriva Respimat- 2sp mid-day;  She will continue her other meds the same $ add-in MUCINEX 1200mg  Bid w/ extra fluids;  We will also prescribe a Depomedrol shot & MEDROL 4mg  taper (see AVS) and keep her on a low dose for several weeks until she can f/u w/ DrNestor... Finally I note that she's doing a mod amt of coughing at night & already on Nexium40Bid- asked to take this ~76min before breakfast 7 dinner, and follow a strict antireflux regimen- NPO after dinner & elev HOB ~6"...     Review of Systems   She has been having intermittent chest tightness & pressure. It does sometimes wake her up at night along with her coughing. Denies any frank chest pain. No fever, chills, or sweats.    Allergies  Allergen Reactions  . Eggs Or Egg-Derived Products     RAW EGGS.. Can eat cooked eggs  . Influenza Vaccines   . Nucala [Mepolizumab]   . Cephalosporins Rash  . Codeine Rash  . Levofloxacin Rash  . Penicillins Rash  . Pneumococcal Vaccines Rash    Current Outpatient Prescriptions on File Prior to Visit  Medication Sig Dispense Refill  . albuterol (PROAIR HFA) 108 (90 BASE) MCG/ACT inhaler Inhale 2 puffs into the lungs every 6 (six) hours as needed for wheezing or shortness of breath. 18 g 4    . albuterol (PROVENTIL) (2.5 MG/3ML) 0.083% nebulizer solution Take 3 mLs (2.5 mg total) by nebulization every 6 (six) hours as needed for wheezing or shortness of breath. Dx: J45.41 360 mL 6  . benzonatate (TESSALON) 100 MG capsule Take 1 capsule (100 mg total) by mouth 3 (three) times daily as needed for cough. 100 capsule 1  . budesonide-formoterol (SYMBICORT) 160-4.5 MCG/ACT inhaler INHALE 2 PUFFS INTO THE LUNGS TWICE A DAY 3 Inhaler 3  . Calcium Carbonate-Vitamin D (CALTRATE 600+D) 600-400 MG-UNIT per tablet Take 1 tablet by mouth daily.     . chlorpheniramine (CHLOR-TRIMETON) 4 MG tablet Take by mouth. 4 mg in the morning and 8 mg at bedtime    . Cholecalciferol (VITAMIN D3) 1000 UNITS tablet Take 1,000 Units by mouth daily.      . clindamycin (CLEOCIN) 300 MG capsule Take 300 mg by mouth. Before dental work    . dextromethorphan (DELSYM) 30 MG/5ML liquid Take 60 mg by mouth 2 (two) times daily as needed for cough.    . diclofenac sodium (  VOLTAREN) 1 % GEL Apply topically as needed.    [name] EPINEPHrine 0.3 mg/0.3 mL IJ SOAJ injection Inject 0.3 mLs (0.3 mg total) into the muscle once. 2 Device 1  . esomeprazole (NEXIUM) 40 MG capsule Take 1 capsule (40 mg total) by mouth 2 (two) times daily. 180 capsule 3  . fexofenadine (ALLEGRA) 180 MG tablet Take 180 mg by mouth daily.      . furosemide (LASIX) 40 MG tablet TAKE ONE-HALF (1/2) TABLET EVERY MORNING 45 tablet 0  . mometasone (NASONEX) 50 MCG/ACT nasal spray USE 2 SPRAYS NASALLY once daily 51 g 4  . montelukast (SINGULAIR) 10 MG tablet TAKE 1 TABLET AT BEDTIME 90 tablet 1  . Multiple Vitamin (MULTIVITAMIN) tablet Take 1 tablet by mouth daily.    . potassium chloride (K-DUR) 10 MEQ tablet TAKE 1 TABLET DAILY 90 tablet 0  . pramipexole (MIRAPEX) 0.125 MG tablet Take 0.375 mg by mouth at bedtime.     . rosuvastatin (CRESTOR) 5 MG tablet Take 1 tablet (5 mg total) by mouth at bedtime. 90 tablet 2  . Spacer/Aero-Holding Chambers (AEROCHAMBER MV)  inhaler by Other route. Use as instructed     . telmisartan (MICARDIS) 20 MG tablet TAKE 1 TABLET AT BEDTIME 90 tablet 0  . Tiotropium Bromide Monohydrate (SPIRIVA RESPIMAT) 2.5 MCG/ACT AERS Inhale 2 puffs into the lungs daily. 3 Inhaler 3  . triamcinolone cream (KENALOG) 0.5 % Apply 1 application topically 3 (three) times daily as needed.    . trolamine salicylate (ASPERCREME) 10 % cream Apply 1 application topically 2 (two) times daily as needed (pain).     . vitamin B-12 (CYANOCOBALAMIN) 500 MCG tablet Take 500 mcg by mouth daily.      [name] Reasons 20 MG TABS tablet Take 20 mg by mouth daily.     . predniSONE (DELTASONE) 20 MG tablet Take 2 tablets (40 mg total) by mouth daily with breakfast. (Patient not taking: Reported on 02/25/2016) 12 tablet 0   Current Facility-Administered Medications on File Prior to Visit  Medication Dose Route Frequency Provider Last Rate Last Dose  . omalizumab Arvid Right) injection 150 mg  150 mg Subcutaneous Q14 Days Javier Glazier, MD   150 mg at 02/13/16 1253    Past Medical History:  Diagnosis Date  . Asthma, severe persistent    pulmologist-- dr Joya Gaskins  . Chronic kidney disease, stage II (mild)   . DDD (degenerative disc disease), cervical   . Diverticulosis   . Edema, lower extremity   . GERD (gastroesophageal reflux disease)   . History of breast cancer    1989  S/P   RIGHT MASTECTOMY ;  NO CHEMORADIATION //   NO RECURRENCE  . History of DVT of lower extremity   . History of shingles   . Hyperlipidemia   . Leg ulcer, left ([place])   . Osteoporosis, unspecified    Knee and hip osteoarthritis bilaterally  . Perennial allergic rhinitis   . Restless legs syndrome (RLS)   . Unspecified essential hypertension     Past Surgical History:  Procedure Laterality Date  . CARDIAC CATHETERIZATION  08/ 01/ 2008   dr Cathie Olden   normal -- EF of 65%  . CARDIOVASCULAR STRESS TEST  05-22-2011   dr Cathie Olden   normal lexiscan perfusion study/  no ischemia/  lvsf  86%    . CATARACT EXTRACTION W/ INTRAOCULAR LENS  IMPLANT, BILATERAL    . CHOLECYSTECTOMY  1993  . INCISION AND DRAINAGE OF WOUND Left 10/24/2013  Procedure: IRRIGATION AND DEBRIDEMENT OF LEFT LEG WITH PLACEMENT OF ACELL AND WOUND VAC;  Surgeon: Theodoro Kos, DO;  Location: [facility];  Service: Plastics;  Laterality: Left;  . [place]  . MASTECTOMY Bilateral rigth 1989///   left  1990   breast cancer 1989//   fibrocytic disease 1990  . TOTAL ABDOMINAL HYSTERECTOMY W/ BILATERAL SALPINGOOPHORECTOMY  1989   done at same time as right mastectomy  . TOTAL KNEE ARTHROPLASTY Right 10/05/2012   Procedure: RIGHT TOTAL KNEE ARTHROPLASTY;  Surgeon: Mauri Pole, MD;  Location: [facility];  Service: Orthopedics;  Laterality: Right;    Current Medications, Allergies, Past Medical History, Past Surgical History, Family History, and Social History were reviewed in [employer] record.     Objective:   Physical Exam  BP 140/72 (BP Location: Left Arm, Patient Position: Sitting, Cuff Size: Normal)   Pulse 92   Temp 97.7 F (36.5 C) (Oral)   Ht 5' 4.75" (1.645 m)   Wt 156 lb (70.8 kg)   SpO2 94%   BMI 26.16 kg/m  General:  Elderly female.No distress. Alert. Integument:  Warm & dry. No rash on exposed skin.  HEENT:  Moist mucus membranes. Mild nasal turbinate swelling unchanged. No oral ulcers. Cardiovascular:  Regular rate. 3/6 Murmur appreciated. No appreciable JVD.  Pulmonary: sl in WOB & diffuse exp wheezing w/ scat rhonchi but no consolidation... Abdomen: Soft. Normal bowel sounds. Mildly protuberant.  Musculoskeletal:  Normal bulk and tone. No joint effusion appreciated.  PFT 01/16/16: FVC 2.26 L (84%) FEV1 1.54 L (78%) FEV1/FVC 0.68 FEF 25-75 0.92 L (65%) positive bronchodilator response 10/31/15: FVC 1.88 L (70%) FEV1 1.26 L (63%) FEV1/FVC 0.67 FEF 25-75 0.68 L (43%) negative bronchodilator response 08/03/15: FVC 2.29 L (85%) FEV1  1.63 L (81%) FEV1/FVC 0.71 FEF 25-75 1.05 L (73%) positive bronchodilator response TLC 5.97 L (115%) RV 143% ERV 35% DLCO corrected 72% (Hgb 12.1) 10/10/11:FVC 2.21 L (77%) FEV1 1.57 L (74%) FEV1/FVC 0.71 FEF 25-75 1.10 L (64%)  IMAGING CXR PA/LAT 08/17/14 (previously reviewed by me): Hyperinflation with flattening of the hemidiaphragms bilaterally. No focal opacity. Hila are full bilaterally. Heart normal in size. No pleural effusion appreciated.   CT CHEST W/O 07/31/14 (previously reviewed by me): Radiology noted a 1.3cm right thyroid nodule. No pleural effusion or thickening. No pericardial effusion. No pathologic mediastinal adenopathy. 69mm LUL nodule stable since 2007 on my review. No other area of consolidation. Mild apical emphysematous changes. Bilateral upper lobe ground glass nodules as well as a ground glass nodule in right lower lobe. Areas of linear opacity consistent with scar tissue formation.  CARDIAC EKG (05/30/15):  Previously reviewed by me. Normal sinus rhythm. QTc 417 ms. Previous Q-wave in V3 has resolved. No obvious signs of ischemia. Patient does have early repolarization in V1 & V2 similar to prior EKG.  TTE (03/16/15): LV normal in size with EF 60-65%. Normal wall motion. Grade 1 diastolic dysfunction. LA & RA normal in size. RV normal in size and function. Pulmonary artery systolic pressure within normal range. Pulmonary artery normal in size. No aortic stenosis or regurgitation. No mitral stenosis or regurgitation. No pulmonic stenosis or regurgitation. Mild tricuspid regurgitation. No pericardial effusion.  LABS 10/31/15 CBC: 5.1/12.7/38.8/2:30 Eosinophil: 0.3 IgE: 425 RAST panel: Negative  11/20/14 Eosinophils: 0.9    Assessment & Plan:    IMP/PLAN>>     Severe, Persistent Asthma:  Continue NEBS, Symbicort, Spiriva, &  Singulair. Continuing Xolair as prescribed. Add a course of MEDROL in tapering schedule...    Allergic Rhinitis: Continuing Allegra, Singulair, &  Nasonex with 2 sprays each nostril once daily. No changes.    Bilateral Lower Extremity DVT: Indefinite treatment with Xarelto. No changes.    GERD w/ Known Hiatal Hernia: Continuing Nexium + vigorous antireflux regimen...    Health maintenance: Patient unable to take the influenza vaccine due to allergy. Also previously had a severe allergic reaction to pneumonia vaccine.    Follow-up: Return to clinic w/ DrNestor in 3weeks...   Deborra Medina. Lenna Gilford, MD [facility] 02/25/16

## 2016-02-25 NOTE — Patient Instructions (Signed)
Today we updated your med list in our EPIC system...    Continue your current medications the same...  Continue the NEBULIZER, [place] regularly...  We gave you a Depo shot for the bronchial tube inflammation...    Follow this w/ the MEDROL 4mg  tabs>>       Take one tab twice daily x 1 wk,,,       Then decrease to one tab each AM for 1 week...       Then decrease to 1/2 tab each AM until your follow up visit w/ DrNestor  Take the [facility] 1200mg  twice daily w/ plenty of fluids...  Continue the [place] 40mg  twice daily (about 30 min before breakfast & dinner)    No not eat or drink much after dinner in the eve...    Elevate the head of your bed abouut 6" to prevent reflux...  Call for any questions...  Let's plan a follw up visit w/ DrNestor in about 3 weeks.Marland KitchenMarland Kitchen

## 2016-02-25 NOTE — Telephone Encounter (Signed)
Called [facility] and spoke with the pharmacist. They have received the medication that [facility] sent over. Nothing further was needed.

## 2016-02-27 ENCOUNTER — Ambulatory Visit (INDEPENDENT_AMBULATORY_CARE_PROVIDER_SITE_OTHER): Payer: Medicare Other

## 2016-02-27 DIAGNOSIS — J4520 Mild intermittent asthma, uncomplicated: Secondary | ICD-10-CM | POA: Diagnosis not present

## 2016-02-27 MED ORDER — OMALIZUMAB 150 MG ~~LOC~~ SOLR
225.0000 mg | SUBCUTANEOUS | Status: DC
  Administered 2016-02-27: 225 mg via SUBCUTANEOUS

## 2016-03-03 ENCOUNTER — Telehealth: Payer: Self-pay | Admitting: Pulmonary Disease

## 2016-03-03 NOTE — Telephone Encounter (Signed)
#   vials:4 Ordered date:03/03/16 Shipping Date:03/04/16

## 2016-03-05 NOTE — Telephone Encounter (Signed)
#   Vials:4 Arrival Date:03/05/16 Lot RO:[id number]   Exp Date:4/21

## 2016-03-12 ENCOUNTER — Ambulatory Visit (INDEPENDENT_AMBULATORY_CARE_PROVIDER_SITE_OTHER): Payer: Medicare Other

## 2016-03-12 DIAGNOSIS — J4520 Mild intermittent asthma, uncomplicated: Secondary | ICD-10-CM

## 2016-03-12 MED ORDER — OMALIZUMAB 150 MG ~~LOC~~ SOLR
225.0000 mg | SUBCUTANEOUS | Status: DC
  Administered 2016-03-12: 225 mg via SUBCUTANEOUS

## 2016-03-13 ENCOUNTER — Ambulatory Visit (INDEPENDENT_AMBULATORY_CARE_PROVIDER_SITE_OTHER): Payer: Medicare Other | Admitting: Cardiovascular Disease

## 2016-03-13 ENCOUNTER — Encounter: Payer: Self-pay | Admitting: Cardiovascular Disease

## 2016-03-13 VITALS — BP 120/76 | HR 83 | Ht 64.5 in | Wt 154.1 lb

## 2016-03-13 DIAGNOSIS — I5032 Chronic diastolic (congestive) heart failure: Secondary | ICD-10-CM | POA: Diagnosis not present

## 2016-03-13 DIAGNOSIS — E782 Mixed hyperlipidemia: Secondary | ICD-10-CM

## 2016-03-13 NOTE — Progress Notes (Signed)
[name] Date of Birth  [birth year]       [facility]    [employer] [address], [place], [place]  [address]   [place], [place]  [address] [phone]     [phone]   Fax  [phone]    Fax [phone]  Problem List: 1. History of chest pain-normal coronary arteries by heart catheterization in August, 2008 2. History of breast cancer-status post right mastectomy and 1989-status post left mastectomy in 1990 3. Hypertension 4. Hyperlipidemia 5. Asthma 6. Chronic diastolic CHF      [name] has had lots of problems with her asthma this summer.  She's not had any cardiac complaints. She denies any chest pain. She's not been able to exercise much of the summer because her asthma problems.  Oct. 23, 2014:  She has had a total knee replacement since I saw her.   She is back exercising some - rehab for her knee.  Has lost a few lbs.   Oct. 27, 2015:  [name] is doing ok from a cardiac standpoint. She's had a wound on the left lower leg which she's been tending to.  She has been going to the wound Center.   [name] history of asthma and has chronic shortness of breath. No angina like pain.    Oct. 27, 2016  Has had lots of breathing issues.  Has had a rough year with her lungs.  Recently had PFTs and was found to her 47% predicted capacity ( on one of the measured functions )  Wakes up with CP on occasion .   Also has some exertional CP .   Sep 12, 2015:  [name] is doing ok from a cardiac standpoint.  Had reported some  chest pain  myoview revealed normal left ventricular systolic function and no ischemia. The echocardiogram revealed normal left ventricle systolic function. She does have grade 1 diastolic dysfunction. She has mild tricuspid regurgitation.  She has not taken her amlodipine this week - due to leg swelling . She wants to keep it on her med list just in case her BP goes up .   She tried Nucala  injections for her asthma.   Did not tolerate it   Has lost 6 lbs   Nov. 2, 2017:  Doing well - she is followed for chronic diastolic congestive heart failure. Has issues with her asthma .  Active but no real exercise   Current Outpatient Prescriptions on File Prior to Visit  Medication Sig Dispense Refill  . albuterol (PROAIR HFA) 108 (90 BASE) MCG/ACT inhaler Inhale 2 puffs into the lungs every 6 (six) hours as needed for wheezing or shortness of breath. 18 g 4  . albuterol (PROVENTIL) (2.5 MG/3ML) 0.083% nebulizer solution Take 3 mLs (2.5 mg total) by nebulization every 6 (six) hours as needed for wheezing or shortness of breath. Dx: J45.41 360 mL 6  . benzonatate (TESSALON) 100 MG capsule Take 1 capsule (100 mg total) by mouth 3 (three) times daily as needed for cough. 100 capsule 1  . budesonide-formoterol (SYMBICORT) 160-4.5 MCG/ACT inhaler INHALE 2 PUFFS INTO THE LUNGS TWICE A DAY 3 Inhaler 3  . Calcium Carbonate-Vitamin D (CALTRATE 600+D) 600-400 MG-UNIT per tablet Take 1 tablet by mouth daily.     . chlorpheniramine (CHLOR-TRIMETON) 4 MG tablet Take by mouth. 4 mg in the morning and 8 mg at bedtime    . Cholecalciferol (VITAMIN D3) 1000 UNITS tablet  Take 1,000 Units by mouth daily.      . clindamycin (CLEOCIN) 300 MG capsule Take 300 mg by mouth. Before dental work    . dextromethorphan (DELSYM) 30 MG/5ML liquid Take 60 mg by mouth 2 (two) times daily as needed for cough.    . diclofenac sodium (VOLTAREN) 1 % GEL Apply topically as needed.    [name] EPINEPHrine 0.3 mg/0.3 mL IJ SOAJ injection Inject 0.3 mLs (0.3 mg total) into the muscle once. 2 Device 1  . esomeprazole (NEXIUM) 40 MG capsule Take 1 capsule (40 mg total) by mouth 2 (two) times daily. 180 capsule 3  . fexofenadine (ALLEGRA) 180 MG tablet Take 180 mg by mouth daily.      . furosemide (LASIX) 40 MG tablet TAKE ONE-HALF (1/2) TABLET EVERY MORNING 45 tablet 0  . methylPREDNISolone (MEDROL) 4 MG tablet Take as directed 50  tablet 0  . mometasone (NASONEX) 50 MCG/ACT nasal spray USE 2 SPRAYS NASALLY once daily 51 g 4  . montelukast (SINGULAIR) 10 MG tablet TAKE 1 TABLET AT BEDTIME 90 tablet 1  . Multiple Vitamin (MULTIVITAMIN) tablet Take 1 tablet by mouth daily.    . potassium chloride (K-DUR) 10 MEQ tablet TAKE 1 TABLET DAILY 90 tablet 0  . pramipexole (MIRAPEX) 0.125 MG tablet Take 0.375 mg by mouth at bedtime.     . rosuvastatin (CRESTOR) 5 MG tablet Take 1 tablet (5 mg total) by mouth at bedtime. 90 tablet 2  . Spacer/Aero-Holding Chambers (AEROCHAMBER MV) inhaler by Other route. Use as instructed     . telmisartan (MICARDIS) 20 MG tablet TAKE 1 TABLET AT BEDTIME 90 tablet 0  . Tiotropium Bromide Monohydrate (SPIRIVA RESPIMAT) 2.5 MCG/ACT AERS Inhale 2 puffs into the lungs daily. 3 Inhaler 3  . triamcinolone cream (KENALOG) 0.5 % Apply 1 application topically 3 (three) times daily as needed.    . trolamine salicylate (ASPERCREME) 10 % cream Apply 1 application topically 2 (two) times daily as needed (pain).     . vitamin B-12 (CYANOCOBALAMIN) 500 MCG tablet Take 500 mcg by mouth daily.      [name] Reasons 20 MG TABS tablet Take 20 mg by mouth daily.      Current Facility-Administered Medications on File Prior to Visit  Medication Dose Route Frequency Provider Last Rate Last Dose  . omalizumab Arvid Right) injection 150 mg  150 mg Subcutaneous Q14 Days Javier Glazier, MD   150 mg at 02/13/16 1253  . omalizumab Arvid Right) injection 225 mg  225 mg Subcutaneous Q14 Days Javier Glazier, MD   225 mg at 02/27/16 1155  . omalizumab Arvid Right) injection 225 mg  225 mg Subcutaneous Q14 Days Javier Glazier, MD   225 mg at 03/12/16 1150    Allergies  Allergen Reactions  . Eggs Or Egg-Derived Products     RAW EGGS.. Can eat cooked eggs  . Influenza Vaccines   . Nucala [Mepolizumab]   . Cephalosporins Rash  . Codeine Rash  . Levofloxacin Rash  . Penicillins Rash  . Pneumococcal Vaccines Rash    Past Medical  History:  Diagnosis Date  . Asthma, severe persistent    pulmologist-- dr Joya Gaskins  . Chronic kidney disease, stage II (mild)   . DDD (degenerative disc disease), cervical   . Diverticulosis   . Edema, lower extremity   . GERD (gastroesophageal reflux disease)   . History of breast cancer    1989  S/P   RIGHT MASTECTOMY ;  NO CHEMORADIATION //  NO RECURRENCE  . History of DVT of lower extremity   . History of shingles   . Hyperlipidemia   . Leg ulcer, left ([place])   . Osteoporosis, unspecified    Knee and hip osteoarthritis bilaterally  . Perennial allergic rhinitis   . Restless legs syndrome (RLS)   . Unspecified essential hypertension     Past Surgical History:  Procedure Laterality Date  . CARDIAC CATHETERIZATION  08/ 01/ 2008   dr Cathie Olden   normal -- EF of 65%  . CARDIOVASCULAR STRESS TEST  05-22-2011   dr Cathie Olden   normal lexiscan perfusion study/  no ischemia/  lvsf  86%  . CATARACT EXTRACTION W/ INTRAOCULAR LENS  IMPLANT, BILATERAL    . CHOLECYSTECTOMY  1993  . INCISION AND DRAINAGE OF WOUND Left 10/24/2013   Procedure: IRRIGATION AND DEBRIDEMENT OF LEFT LEG WITH PLACEMENT OF ACELL AND WOUND VAC;  Surgeon: Theodoro Kos, DO;  Location: [facility];  Service: Plastics;  Laterality: Left;  . [place]  . MASTECTOMY Bilateral rigth 1989///   left  1990   breast cancer 1989//   fibrocytic disease 1990  . TOTAL ABDOMINAL HYSTERECTOMY W/ BILATERAL SALPINGOOPHORECTOMY  1989   done at same time as right mastectomy  . TOTAL KNEE ARTHROPLASTY Right 10/05/2012   Procedure: RIGHT TOTAL KNEE ARTHROPLASTY;  Surgeon: Mauri Pole, MD;  Location: [facility];  Service: Orthopedics;  Laterality: Right;    History  Smoking Status  . Never Smoker  Smokeless Tobacco  . Never Used    History  Alcohol Use No    Family History  Problem Relation Age of Onset  . Heart attack Mother   . Asthma Mother   . Heart disease Mother   . Hyperlipidemia  Mother   . Heart attack Father   . Heart failure Father   . Deep vein thrombosis Father   . Heart disease Father   . Peripheral vascular disease Father     amputation  . Cancer Brother   . Asthma Brother   . Coronary artery disease Sister   . Heart disease Sister     Reviw of Systems:  Reviewed in the HPI.  All other systems are negative.  Physical Exam: Blood pressure 120/76, pulse 83, height 5' 4.5" (1.638 m), weight 154 lb 1.9 oz (69.9 kg). General: Well developed, well nourished, in no acute distress.  Head: Normocephalic, atraumatic, sclera non-icteric, mucus membranes are moist,  Neck: Supple. Carotids are 2 + without bruits. No JVD Lungs: Clear bilaterally to auscultation. Heart: regular rate.  normal  S1 S2. No murmurs, gallops or rubs. Abdomen: Soft, non-tender, non-distended with normal bowel sounds. No hepatomegaly. No rebound/guarding. No masses. Msk:  Strength and tone are normal Extremities: No clubbing or cyanosis. No edema.  Distal pedal pulses are 2+ and equal bilaterally. Neuro: Alert and oriented X 3. Moves all extremities spontaneously. Psych:  Responds to questions appropriately with a normal affect.  ECG: Nov, 2, 2017:   SR at 40.  Poor R wave progression - likely lead placement .   Assessment / Plan:  : 1. Chest discomfort: Liseth had a normal myoview  Echo showed normal LV systolic function.   Grade 1 diastolic dysfunction  2. Chronic diastolic congestive heart failure: She's doing well. Continue Lasix and potassium.    3. Dyspnea:   She has chronic lung disease    Mertie Moores, MD  03/13/2016 11:27 AM  [place],  [place], [place]  [address] Pager [phone] Phone: [phone]; Fax: [phone]   [facility]  [address] [place], [place]  [address] [phone]   Fax [phone]

## 2016-03-13 NOTE — Patient Instructions (Signed)

## 2016-03-26 ENCOUNTER — Ambulatory Visit: Payer: Medicare Other | Admitting: Pulmonary Disease

## 2016-03-26 ENCOUNTER — Ambulatory Visit (INDEPENDENT_AMBULATORY_CARE_PROVIDER_SITE_OTHER): Payer: Medicare Other

## 2016-03-26 ENCOUNTER — Ambulatory Visit: Payer: Medicare Other

## 2016-03-26 DIAGNOSIS — J4550 Severe persistent asthma, uncomplicated: Secondary | ICD-10-CM

## 2016-03-27 ENCOUNTER — Encounter: Payer: Self-pay | Admitting: Pulmonary Disease

## 2016-03-27 ENCOUNTER — Ambulatory Visit (INDEPENDENT_AMBULATORY_CARE_PROVIDER_SITE_OTHER): Payer: Medicare Other | Admitting: Pulmonary Disease

## 2016-03-27 VITALS — BP 134/82 | HR 90 | Ht 64.75 in | Wt 154.0 lb

## 2016-03-27 DIAGNOSIS — I82503 Chronic embolism and thrombosis of unspecified deep veins of lower extremity, bilateral: Secondary | ICD-10-CM

## 2016-03-27 DIAGNOSIS — J309 Allergic rhinitis, unspecified: Secondary | ICD-10-CM

## 2016-03-27 DIAGNOSIS — K449 Diaphragmatic hernia without obstruction or gangrene: Secondary | ICD-10-CM

## 2016-03-27 DIAGNOSIS — J4550 Severe persistent asthma, uncomplicated: Secondary | ICD-10-CM | POA: Diagnosis not present

## 2016-03-27 DIAGNOSIS — K219 Gastro-esophageal reflux disease without esophagitis: Secondary | ICD-10-CM | POA: Diagnosis not present

## 2016-03-27 NOTE — Progress Notes (Signed)
Subjective:    Patient ID: [name], female    DOB: 09-Apr-1933, 82 y.o.   MRN: [id number]  C.C.:  Follow-up with Severe, Persistent Asthma, Chronic Allergic Rhinitis, DVT, & GERD w/ Hiatal Hernia.  HPI Severe, Persistent Asthma:  Currently on Xolair, Spiriva, Symbicort, and Singulair. Last seen on 10/16 and treated with a steroid taper for an exacerbation. She reports her breathing is doing "ok today". She continues to have an intermittent cough & wheeze that she reports is "not much". She reports she hasn't needed to use her rescue inhaler recently. No nocturnal awakenings with any wheezing but does wake up coughing at times.   Chronic Allergic Rhinitis:  On Nasonex, Singulair & Allegra. She reports she has had increased sinus drainage today. No sinus congestion, pressure, or pain.  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 On systemic anticoagulation indefinitely. No melena or hematochezia.   GERD w/ Hiatal Hernia:  Prescribed Nexium. No reflux or dyspepsia. No morning brash water taste.   Review of Systems No fever, chills, or sweats. She denies any chest tightness or pressure. No rashes or bruising.   Allergies  Allergen Reactions  . Eggs Or Egg-Derived Products     RAW EGGS.. Can eat cooked eggs  . Influenza Vaccines   . Nucala [Mepolizumab]   . Cephalosporins Rash  . Codeine Rash  . Levofloxacin Rash  . Penicillins Rash  . Pneumococcal Vaccines Rash    Current Outpatient Prescriptions on File Prior to Visit  Medication Sig Dispense Refill  . albuterol (PROAIR HFA) 108 (90 BASE) MCG/ACT inhaler Inhale 2 puffs into the lungs every 6 (six) hours as needed for wheezing or shortness of breath. 18 g 4  . albuterol (PROVENTIL) (2.5 MG/3ML) 0.083% nebulizer solution Take 3 mLs (2.5 mg total) by nebulization every 6 (six) hours as needed for wheezing or shortness of breath. Dx: J45.41 360 mL 6  . benzonatate (TESSALON) 100 MG  capsule Take 1 capsule (100 mg total) by mouth 3 (three) times daily as needed for cough. 100 capsule 1  . budesonide-formoterol (SYMBICORT) 160-4.5 MCG/ACT inhaler INHALE 2 PUFFS INTO THE LUNGS TWICE A DAY 3 Inhaler 3  . Calcium Carbonate-Vitamin D (CALTRATE 600+D) 600-400 MG-UNIT per tablet Take 1 tablet by mouth daily.     . chlorpheniramine (CHLOR-TRIMETON) 4 MG tablet Take by mouth. 4 mg in the morning and 8 mg at bedtime    . Cholecalciferol (VITAMIN D3) 1000 UNITS tablet Take 1,000 Units by mouth daily.      . clindamycin (CLEOCIN) 300 MG capsule Take 300 mg by mouth. Before dental work    . dextromethorphan (DELSYM) 30 MG/5ML liquid Take 60 mg by mouth 2 (two) times daily as needed for cough.    . diclofenac sodium (VOLTAREN) 1 % GEL Apply topically as needed.    [name] EPINEPHrine 0.3 mg/0.3 mL IJ SOAJ injection Inject 0.3 mLs (0.3 mg total) into the muscle once. 2 Device 1  . esomeprazole (NEXIUM) 40 MG capsule Take 1 capsule (40 mg total) by mouth 2 (two) times daily. 180 capsule 3  . fexofenadine (ALLEGRA) 180 MG tablet Take 180 mg by mouth daily.      . furosemide (LASIX) 40 MG tablet TAKE ONE-HALF (1/2) TABLET EVERY MORNING 45 tablet 0  . methylPREDNISolone (MEDROL) 4 MG tablet Take as directed 50 tablet 0  . mometasone (NASONEX) 50 MCG/ACT nasal spray USE 2 SPRAYS NASALLY once daily 51 g  4  . montelukast (SINGULAIR) 10 MG tablet TAKE 1 TABLET AT BEDTIME 90 tablet 1  . Multiple Vitamin (MULTIVITAMIN) tablet Take 1 tablet by mouth daily.    . potassium chloride (K-DUR) 10 MEQ tablet TAKE 1 TABLET DAILY 90 tablet 0  . pramipexole (MIRAPEX) 0.125 MG tablet Take 0.375 mg by mouth at bedtime.     . rosuvastatin (CRESTOR) 5 MG tablet Take 1 tablet (5 mg total) by mouth at bedtime. 90 tablet 2  . Spacer/Aero-Holding Chambers (AEROCHAMBER MV) inhaler by Other route. Use as instructed     . telmisartan (MICARDIS) 20 MG tablet TAKE 1 TABLET AT BEDTIME 90 tablet 0  . Tiotropium Bromide Monohydrate  (SPIRIVA RESPIMAT) 2.5 MCG/ACT AERS Inhale 2 puffs into the lungs daily. 3 Inhaler 3  . triamcinolone cream (KENALOG) 0.5 % Apply 1 application topically 3 (three) times daily as needed.    . trolamine salicylate (ASPERCREME) 10 % cream Apply 1 application topically 2 (two) times daily as needed (pain).     . vitamin B-12 (CYANOCOBALAMIN) 500 MCG tablet Take 500 mcg by mouth daily.      [name] Reasons 20 MG TABS tablet Take 20 mg by mouth daily.      Current Facility-Administered Medications on File Prior to Visit  Medication Dose Route Frequency Provider Last Rate Last Dose  . omalizumab Arvid Right) injection 150 mg  150 mg Subcutaneous Q14 Days Javier Glazier, MD   150 mg at 02/13/16 1253  . omalizumab Arvid Right) injection 225 mg  225 mg Subcutaneous Q14 Days Javier Glazier, MD   225 mg at 02/27/16 1155  . omalizumab Arvid Right) injection 225 mg  225 mg Subcutaneous Q14 Days Javier Glazier, MD   225 mg at 03/12/16 1150    Past Medical History:  Diagnosis Date  . Asthma, severe persistent    pulmologist-- dr Joya Gaskins  . Chronic kidney disease, stage II (mild)   . DDD (degenerative disc disease), cervical   . Diverticulosis   . Edema, lower extremity   . GERD (gastroesophageal reflux disease)   . History of breast cancer    1989  S/P   RIGHT MASTECTOMY ;  NO CHEMORADIATION //   NO RECURRENCE  . History of DVT of lower extremity   . History of shingles   . Hyperlipidemia   . Leg ulcer, left ([place])   . Osteoporosis, unspecified    Knee and hip osteoarthritis bilaterally  . Perennial allergic rhinitis   . Restless legs syndrome (RLS)   . Unspecified essential hypertension     Past Surgical History:  Procedure Laterality Date  . CARDIAC CATHETERIZATION  08/ 01/ 2008   dr Cathie Olden   normal -- EF of 65%  . CARDIOVASCULAR STRESS TEST  05-22-2011   dr Cathie Olden   normal lexiscan perfusion study/  no ischemia/  lvsf  86%  . CATARACT EXTRACTION W/ INTRAOCULAR LENS  IMPLANT, BILATERAL    .  CHOLECYSTECTOMY  1993  . INCISION AND DRAINAGE OF WOUND Left 10/24/2013   Procedure: IRRIGATION AND DEBRIDEMENT OF LEFT LEG WITH PLACEMENT OF ACELL AND WOUND VAC;  Surgeon: Theodoro Kos, DO;  Location: [facility];  Service: Plastics;  Laterality: Left;  . [place]  . MASTECTOMY Bilateral rigth 1989///   left  1990   breast cancer 1989//   fibrocytic disease 1990  . TOTAL ABDOMINAL HYSTERECTOMY W/ BILATERAL SALPINGOOPHORECTOMY  1989   done at same time as right  mastectomy  . TOTAL KNEE ARTHROPLASTY Right 10/05/2012   Procedure: RIGHT TOTAL KNEE ARTHROPLASTY;  Surgeon: Mauri Pole, MD;  Location: [facility];  Service: Orthopedics;  Laterality: Right;    Family History  Problem Relation Age of Onset  . Heart attack Mother   . Asthma Mother   . Heart disease Mother   . Hyperlipidemia Mother   . Heart attack Father   . Heart failure Father   . Deep vein thrombosis Father   . Heart disease Father   . Peripheral vascular disease Father     amputation  . Cancer Brother   . Asthma Brother   . Coronary artery disease Sister   . Heart disease Sister     Social History   Social History  . Marital status: Married    Spouse name: N/A  . Number of children: N/A  . Years of education: N/A   Social History Main Topics  . Smoking status: Never Smoker  . Smokeless tobacco: Never Used  . Alcohol use No  . Drug use: No  . Sexual activity: Not Asked   Other Topics Concern  . None   Social History Narrative   Originally from [place]. Always lived in [place]. Has traveled across the country to [place]. 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 No known asbestos exposure (brother with mesothelioma). No pets currently. No bird exposure. No mold exposure. Carpet has been removed from her home.       Objective:   Physical Exam Ht 5' 4.75" (1.645 m)   Wt 154 lb (69.9 kg)   BMI 25.83 kg/m  General:  Elderly  female.Comfortable. Intermittent coughing. Integument:  Warm & dry. No rash on exposed skin.  HEENT:  No nasal turbinate swelling. No oral ulcers. Moist mucous membranes. Cardiovascular:  Regular rate. 3/6 Murmur appreciated. No edema. Pulmonary: Good aeration bilaterally and clear to auscultation. Normal work of breathing on room air. Intermittent coughing noted.  Abdomen: Soft. Normal bowel sounds. Mildly protuberant.  Musculoskeletal:  Normal bulk and tone. No joint effusion appreciated.  PFT 01/16/16: FVC 2.26 L (84%) FEV1 1.54 L (78%) FEV1/FVC 0.68 FEF 25-75 0.92 L (65%) positive bronchodilator response 10/31/15: FVC 1.88 L (70%) FEV1 1.26 L (63%) FEV1/FVC 0.67 FEF 25-75 0.68 L (43%) negative bronchodilator response 08/03/15: FVC 2.29 L (85%) FEV1 1.63 L (81%) FEV1/FVC 0.71 FEF 25-75 1.05 L (73%) positive bronchodilator response TLC 5.97 L (115%) RV 143% ERV 35% DLCO corrected 72% (Hgb 12.1) 10/10/11:FVC 2.21 L (77%) FEV1 1.57 L (74%) FEV1/FVC 0.71 FEF 25-75 1.10 L (64%)  IMAGING CXR PA/LAT 08/17/14 (previously reviewed by me): Hyperinflation with flattening of the hemidiaphragms bilaterally. No focal opacity. Hila are full bilaterally. Heart normal in size. No pleural effusion appreciated.   CT CHEST W/O 07/31/14 (previously reviewed by me): Radiology noted a 1.3cm right thyroid nodule. No pleural effusion or thickening. No pericardial effusion. No pathologic mediastinal adenopathy. 30mm LUL nodule stable since 2007 on my review. No other area of consolidation. Mild apical emphysematous changes. Bilateral upper lobe ground glass nodules as well as a ground glass nodule in right lower lobe. Areas of linear opacity consistent with scar tissue formation.  CARDIAC EKG (05/30/15):  Previously reviewed by me. Normal sinus rhythm. QTc 417 ms. Previous Q-wave in V3 has resolved. No obvious signs of ischemia. Patient does have early repolarization in V1 & V2 similar to prior EKG.  TTE (03/16/15): LV  normal in size with EF 60-65%. Normal  wall motion. Grade 1 diastolic dysfunction. LA & RA normal in size. RV normal in size and function. Pulmonary artery systolic pressure within normal range. Pulmonary artery normal in size. No aortic stenosis or regurgitation. No mitral stenosis or regurgitation. No pulmonic stenosis or regurgitation. Mild tricuspid regurgitation. No pericardial effusion.  LABS 10/31/15 CBC: 5.1/12.7/38.8/2:30 Eosinophil: 0.3 IgE: 425 RAST panel: Negative  11/20/14 Eosinophils: 0.9    Assessment & Plan:  82 y.o. Caucasian female with underlying severe, persistent asthma, chronic allergic rhinitis, bilateral lower extremity DVT, & GERD w/ hiatal hernia.Patient seems to be recovering from her asthma exacerbation. She has no wheezing that would necessitate further steroids at this time. Plan to continue her current regimen of Xolair, Symbicort, Spiriva, and Singulair. My hope is that as Xolair takes effect or exacerbations will continue to become less frequent.  1. Severe, Persistent Asthma: Continuing Xolair, Symbicort, Spiriva, & Singulair. Plan for spirometry with bronchodilator challenge at next appointment. 2. Chronic Allergic Rhinitis: Suboptimally controlled with Allegra, Singulair, & Nasonex with 2 sprays each nostril once daily. Continuing medications without change. 3. Bilateral Lower Extremity DVT: Indefinite treatment with systemic anticoagulation. Continuing Xarelto. 4. GERD w/ Known Hiatal Hernia: No symptoms on Nexium. No changes at this time. 5. Health Maintenance: Unable to take influenza vaccine due to allergy. Also previously had a severe allergic reaction to pneumonia vaccine. 6. Follow-up: Return to clinic in 3 months or sooner if needed.  Sonia Baller Ashok Cordia, M.D. [facility] Pager:  [phone] After 3pm or if no response, call [phone] 1:33 PM 03/27/16

## 2016-03-27 NOTE — Patient Instructions (Addendum)
   Continue taking your medications as prescribed.   We will reschedule your December appointment to 3 months from today.  Call me if you have any new breathing problems.  TESTS ORDERED: 1. Rescheduled spirometry with bronchodilator challenge to next appointment in 3 months (from December)

## 2016-04-01 ENCOUNTER — Telehealth: Payer: Self-pay | Admitting: Pulmonary Disease

## 2016-04-01 MED ORDER — OMALIZUMAB 150 MG ~~LOC~~ SOLR
225.0000 mg | Freq: Once | SUBCUTANEOUS | Status: AC
  Administered 2016-03-26: 225 mg via SUBCUTANEOUS

## 2016-04-01 NOTE — Telephone Encounter (Signed)
Thanks! Will leave encounter open until xolair comes in.

## 2016-04-01 NOTE — Telephone Encounter (Signed)
Will forward to [place] to set up delivery for the pts xolair.  thanks

## 2016-04-01 NOTE — Progress Notes (Signed)
Pt Xolair is supplied through [facility]; no need for Xolair charge in [place]

## 2016-04-01 NOTE — Telephone Encounter (Signed)
#   vials:4 Ordered date:04/01/16  Shipping Date:04/08/2016  Forwarding message to Alroy Bailiff to follow up on.

## 2016-04-08 NOTE — Telephone Encounter (Signed)
#   Vials:4 Arrival Date:04/08/16 Lot [id number] Exp Date:5/21

## 2016-04-09 ENCOUNTER — Ambulatory Visit (INDEPENDENT_AMBULATORY_CARE_PROVIDER_SITE_OTHER): Payer: Medicare Other

## 2016-04-09 DIAGNOSIS — J4540 Moderate persistent asthma, uncomplicated: Secondary | ICD-10-CM

## 2016-04-09 MED ORDER — OMALIZUMAB 150 MG ~~LOC~~ SOLR
225.0000 mg | SUBCUTANEOUS | Status: DC
  Administered 2016-04-09: 225 mg via SUBCUTANEOUS

## 2016-04-10 DIAGNOSIS — H40013 Open angle with borderline findings, low risk, bilateral: Secondary | ICD-10-CM | POA: Diagnosis not present

## 2016-04-10 DIAGNOSIS — Z961 Presence of intraocular lens: Secondary | ICD-10-CM | POA: Diagnosis not present

## 2016-04-10 DIAGNOSIS — H04123 Dry eye syndrome of bilateral lacrimal glands: Secondary | ICD-10-CM | POA: Diagnosis not present

## 2016-04-10 DIAGNOSIS — H1851 Endothelial corneal dystrophy: Secondary | ICD-10-CM | POA: Diagnosis not present

## 2016-04-12 ENCOUNTER — Other Ambulatory Visit: Payer: Self-pay | Admitting: Cardiovascular Disease

## 2016-04-16 ENCOUNTER — Telehealth: Payer: Self-pay | Admitting: Pulmonary Disease

## 2016-04-16 NOTE — Telephone Encounter (Signed)
#   vials:4 Ordered date:04/16/16 Shipping Date:04/21/16

## 2016-04-21 ENCOUNTER — Ambulatory Visit: Payer: Medicare Other | Admitting: Pulmonary Disease

## 2016-04-22 NOTE — Telephone Encounter (Signed)
Vials:4 Arrival Date:04/22/16 Lot [id number] Exp Date:5/21

## 2016-04-23 ENCOUNTER — Ambulatory Visit (INDEPENDENT_AMBULATORY_CARE_PROVIDER_SITE_OTHER): Payer: Medicare Other

## 2016-04-23 ENCOUNTER — Ambulatory Visit: Payer: Medicare Other | Admitting: Pulmonary Disease

## 2016-04-23 DIAGNOSIS — J4540 Moderate persistent asthma, uncomplicated: Secondary | ICD-10-CM | POA: Diagnosis not present

## 2016-04-23 MED ORDER — OMALIZUMAB 150 MG ~~LOC~~ SOLR
225.0000 mg | SUBCUTANEOUS | Status: DC
  Administered 2016-04-23: 225 mg via SUBCUTANEOUS

## 2016-05-07 ENCOUNTER — Ambulatory Visit (INDEPENDENT_AMBULATORY_CARE_PROVIDER_SITE_OTHER): Payer: Medicare Other

## 2016-05-07 DIAGNOSIS — J45998 Other asthma: Secondary | ICD-10-CM | POA: Diagnosis not present

## 2016-05-07 DIAGNOSIS — Z6827 Body mass index (BMI) 27.0-27.9, adult: Secondary | ICD-10-CM | POA: Diagnosis not present

## 2016-05-07 DIAGNOSIS — R5381 Other malaise: Secondary | ICD-10-CM | POA: Diagnosis not present

## 2016-05-07 DIAGNOSIS — R7301 Impaired fasting glucose: Secondary | ICD-10-CM | POA: Diagnosis not present

## 2016-05-07 DIAGNOSIS — J4520 Mild intermittent asthma, uncomplicated: Secondary | ICD-10-CM | POA: Diagnosis not present

## 2016-05-07 DIAGNOSIS — J3089 Other allergic rhinitis: Secondary | ICD-10-CM | POA: Diagnosis not present

## 2016-05-07 DIAGNOSIS — I10 Essential (primary) hypertension: Secondary | ICD-10-CM | POA: Diagnosis not present

## 2016-05-07 MED ORDER — OMALIZUMAB 150 MG ~~LOC~~ SOLR
225.0000 mg | SUBCUTANEOUS | Status: DC
  Administered 2016-05-07: 225 mg via SUBCUTANEOUS

## 2016-05-21 ENCOUNTER — Ambulatory Visit (INDEPENDENT_AMBULATORY_CARE_PROVIDER_SITE_OTHER): Payer: Medicare Other

## 2016-05-21 DIAGNOSIS — J4540 Moderate persistent asthma, uncomplicated: Secondary | ICD-10-CM

## 2016-05-23 MED ORDER — OMALIZUMAB 150 MG ~~LOC~~ SOLR
225.0000 mg | SUBCUTANEOUS | Status: DC
  Administered 2016-05-21: 225 mg via SUBCUTANEOUS

## 2016-05-30 ENCOUNTER — Telehealth: Payer: Self-pay | Admitting: Pulmonary Disease

## 2016-05-30 ENCOUNTER — Other Ambulatory Visit: Payer: Self-pay

## 2016-05-30 MED ORDER — OMALIZUMAB 150 MG ~~LOC~~ SOLR: 225.0000 mg | SUBCUTANEOUS | 11 refills | Status: DC

## 2016-05-30 NOTE — Telephone Encounter (Signed)
Patient's insurance has changed for 2018- she no longer has pharmacy benefits through [place], must use [facility] [place].  [facility], initiated setup for patient through this pharmacy.  New rx for Xolair faxed to [phone].  No PA needed for this rx per rep.  [facility] will call patient to set up payment options and receive VO from patient to ship medication.  Once VO is given by patient, medication will be overnighted to office.  [facility] aware that [facility] is due for injection on 06/04/16.    Pt is aware of above information, will await call from new pharmacy.    # vials:4 Ordered date:05/30/16  Shipping Date:-based on when patient is contacted by [facility].

## 2016-06-02 ENCOUNTER — Telehealth: Payer: Self-pay | Admitting: Pulmonary Disease

## 2016-06-02 NOTE — Telephone Encounter (Signed)
I spoke with [name] with [facility], who states pt's RX has been received. [name] states the Rx is now been review by pharmacist and pt's insurance and then will be processed.  I have spoke with pt and made aware of this. Pt states she is scheduled for her injection on 06-04-16. I have advised pt to contact us in the next day or so, as we may have to postpone her injection until medication is received  Nothing further needed.

## 2016-06-02 NOTE — Telephone Encounter (Signed)
Patient has not heard from [facility].  She states she was told to call on Monday if she had not heard from them. She is due for injection on 06/04/16 and has not [facility].  CB is [phone].

## 2016-06-02 NOTE — Telephone Encounter (Signed)
#   vials:4 Ordered date:06/02/16 Shipping Date:06/03/16

## 2016-06-04 ENCOUNTER — Ambulatory Visit: Payer: Medicare Other

## 2016-06-05 NOTE — Telephone Encounter (Signed)
Sent [facility] 06/04/16 about new rx coverage. Waiting to hear from [employer]. Will keep encounter open till They fax or call us back.

## 2016-06-06 NOTE — Telephone Encounter (Signed)
#   vials:4 Ordered date:06/06/16 Shipping Date:06/09/16   Reordered today b/c we just got Date and a ref. case # [id number] (New rx plan) I called the pt. To make her aware, she going to call back next and make an appt. For Wed.[name]

## 2016-06-10 DIAGNOSIS — M859 Disorder of bone density and structure, unspecified: Secondary | ICD-10-CM | POA: Diagnosis not present

## 2016-06-10 DIAGNOSIS — N183 Chronic kidney disease, stage 3 (moderate): Secondary | ICD-10-CM | POA: Diagnosis not present

## 2016-06-10 NOTE — Telephone Encounter (Signed)
#   Vials:4 Arrival Date:06/10/16 Lot [id number] Exp Date:8/21 According to pt.'s last recorded weight and her IgE level she should be getting 300 mg instead of 225 mg. This refill still has 225 mg. That's what she'll get for her next two shots.

## 2016-06-11 ENCOUNTER — Ambulatory Visit (INDEPENDENT_AMBULATORY_CARE_PROVIDER_SITE_OTHER): Payer: Medicare Other

## 2016-06-11 DIAGNOSIS — J4520 Mild intermittent asthma, uncomplicated: Secondary | ICD-10-CM

## 2016-06-12 MED ORDER — OMALIZUMAB 150 MG ~~LOC~~ SOLR
225.0000 mg | SUBCUTANEOUS | Status: DC
  Administered 2016-06-11: 225 mg via SUBCUTANEOUS

## 2016-06-12 NOTE — Progress Notes (Signed)
Documentation of medication administration of Xolair and charges have been completed by Lindsay Lemons, CMA based on the Xolair documentation sheet completed by Tammy Scott.  

## 2016-06-16 ENCOUNTER — Telehealth: Payer: Self-pay | Admitting: Pulmonary Disease

## 2016-06-16 MED ORDER — DOXYCYCLINE HYCLATE 100 MG PO TABS: 100.0000 mg | ORAL_TABLET | Freq: Two times a day (BID) | ORAL | 0 refills | Status: DC

## 2016-06-16 MED ORDER — METHYLPREDNISOLONE 4 MG PO TBPK
ORAL_TABLET | ORAL | 0 refills | Status: DC
Start: 2016-06-16 — End: 2016-09-16

## 2016-06-16 NOTE — Telephone Encounter (Signed)
Please send in a prescription for a Medrol Dose Pak. Also prescribe Doxycycline 100mg  po BID x 7 days. She should contact us if her symptoms do not improve or worsen. She should not take the antibiotic with any dairy products. She should take the antibiotic with a full glass of water while remaining upright for 1 hour. Thank you.

## 2016-06-16 NOTE — Telephone Encounter (Signed)
Spoke with pt, aware of recs.  rx's sent to preferred pharmacy.  Nothing further needed.  

## 2016-06-16 NOTE — Telephone Encounter (Signed)
Spoke with pt. She is having lots of sinus congestion, PND, cough. Cough is non productive. Denies any other pulmonary symptoms >> SOB, chest tightness, wheezing. Has been taking Mucinex DM with no relief. Symptoms started 2 weeks ago. Would like to have a prescription for Medrol.  JN - please advise. Thanks.

## 2016-06-25 ENCOUNTER — Ambulatory Visit (INDEPENDENT_AMBULATORY_CARE_PROVIDER_SITE_OTHER): Payer: Medicare Other

## 2016-06-25 ENCOUNTER — Other Ambulatory Visit: Payer: Self-pay

## 2016-06-25 ENCOUNTER — Other Ambulatory Visit: Payer: Self-pay | Admitting: Pulmonary Disease

## 2016-06-25 DIAGNOSIS — J4540 Moderate persistent asthma, uncomplicated: Secondary | ICD-10-CM

## 2016-06-25 MED ORDER — MOMETASONE FUROATE 50 MCG/ACT NA SUSP: NASAL | 6 refills | Status: DC

## 2016-06-25 MED ORDER — MONTELUKAST SODIUM 10 MG PO TABS: 10.0000 mg | ORAL_TABLET | Freq: Every day | ORAL | 2 refills | Status: DC

## 2016-06-25 MED ORDER — MONTELUKAST SODIUM 10 MG PO TABS: 10.0000 mg | ORAL_TABLET | Freq: Every day | ORAL | 3 refills | Status: DC

## 2016-06-26 MED ORDER — OMALIZUMAB 150 MG ~~LOC~~ SOLR
225.0000 mg | Freq: Once | SUBCUTANEOUS | Status: AC
  Administered 2016-06-25: 225 mg via SUBCUTANEOUS

## 2016-06-30 ENCOUNTER — Telehealth: Payer: Self-pay | Admitting: Pulmonary Disease

## 2016-06-30 NOTE — Telephone Encounter (Addendum)
#   vials:4 Ordered date:06/30/16 Shipping Date:07/07/16

## 2016-07-02 ENCOUNTER — Ambulatory Visit (INDEPENDENT_AMBULATORY_CARE_PROVIDER_SITE_OTHER): Payer: Medicare Other | Admitting: Pulmonary Disease

## 2016-07-02 ENCOUNTER — Encounter: Payer: Self-pay | Admitting: Pulmonary Disease

## 2016-07-02 DIAGNOSIS — J309 Allergic rhinitis, unspecified: Secondary | ICD-10-CM | POA: Diagnosis not present

## 2016-07-02 DIAGNOSIS — J4550 Severe persistent asthma, uncomplicated: Secondary | ICD-10-CM | POA: Diagnosis not present

## 2016-07-02 DIAGNOSIS — L03119 Cellulitis of unspecified part of limb: Secondary | ICD-10-CM

## 2016-07-02 DIAGNOSIS — I82403 Acute embolism and thrombosis of unspecified deep veins of lower extremity, bilateral: Secondary | ICD-10-CM

## 2016-07-02 LAB — PULMONARY FUNCTION TEST
FEF 25-75 POST: 1.79 L/s
FEF 25-75 Pre: 1.32 L/sec
FEF2575-%CHANGE-POST: 34 %
FEF2575-%PRED-PRE: 95 %
FEF2575-%Pred-Post: 129 %
FEV1-%Change-Post: 8 %
FEV1-%PRED-PRE: 90 %
FEV1-%Pred-Post: 98 %
FEV1-Post: 1.93 L
FEV1-Pre: 1.77 L
FEV1FVC-%CHANGE-POST: 3 %
FEV1FVC-%PRED-PRE: 100 %
FEV6-%Change-Post: 5 %
FEV6-%Pred-Post: 100 %
FEV6-%Pred-Pre: 95 %
FEV6-Post: 2.51 L
FEV6-Pre: 2.39 L
FEV6FVC-%Change-Post: 0 %
FEV6FVC-%Pred-Post: 106 %
FEV6FVC-%Pred-Pre: 106 %
FVC-%Change-Post: 5 %
FVC-%PRED-PRE: 90 %
FVC-%Pred-Post: 95 %
FVC-POST: 2.51 L
FVC-PRE: 2.39 L
PRE FEV1/FVC RATIO: 74 %
Post FEV1/FVC ratio: 77 %
Post FEV6/FVC ratio: 100 %
Pre FEV6/FVC Ratio: 100 %

## 2016-07-02 NOTE — Progress Notes (Signed)
Subjective:    Patient ID: [name], female    DOB: July 13, 1932, 83 y.o.   MRN: [id number]  C.C.:  Follow-up with Severe, Persistent Asthma, Chronic Allergic Rhinitis, DVT, & GERD w/ Hiatal Hernia.  HPI Severe, Persistent Asthma:Currently prescribed Symbicort, Spiriva, Singulair, and Xolair. She reports she has been having more coughing with post-nasal drainage waking up at night with it. She reports no new problems with her Xolair. She is using her rescue inhaler when she wakes up at night. She does have increased dyspnea with exertion.   Chronic Allergic Rhinitis: Prescribed Allegra, Singulair, Nasonex, and Xolair. She reports she has had increased sinus congestion & drainage. Her symptoms started 6 weeks ago.   DVT: Indefinite treatment with Xarelto. No bleeding, melena, hematochezia or hematuria.   GERD w/ Hiatal Hernia: Prescribed Nexium. No abdominal pain or nausea. No reflux or dyspepsia. Morning brash water taste.  Review of Systems Denies any fever, chills, or sweats. She does wake up at times at night with chest pain with her coughing.   Allergies  Allergen Reactions  . Eggs Or Egg-Derived Products     RAW EGGS.. Can eat cooked eggs  . Influenza Vaccines   . Nucala [Mepolizumab]   . Cephalosporins Rash  . Codeine Rash  . Levofloxacin Rash  . Penicillins Rash  . Pneumococcal Vaccines Rash    Current Outpatient Prescriptions on File Prior to Visit  Medication Sig Dispense Refill  . albuterol (PROAIR HFA) 108 (90 BASE) MCG/ACT inhaler Inhale 2 puffs into the lungs every 6 (six) hours as needed for wheezing or shortness of breath. 18 g 4  . albuterol (PROVENTIL) (2.5 MG/3ML) 0.083% nebulizer solution Take 3 mLs (2.5 mg total) by nebulization every 6 (six) hours as needed for wheezing or shortness of breath. Dx: J45.41 360 mL 6  . benzonatate (TESSALON) 100 MG capsule Take 1 capsule (100 mg total) by mouth 3 (three) times daily as needed for cough. 100 capsule 1  .  budesonide-formoterol (SYMBICORT) 160-4.5 MCG/ACT inhaler INHALE 2 PUFFS INTO THE LUNGS TWICE A DAY 3 Inhaler 3  . Calcium Carbonate-Vitamin D (CALTRATE 600+D) 600-400 MG-UNIT per tablet Take 1 tablet by mouth daily.     . chlorpheniramine (CHLOR-TRIMETON) 4 MG tablet Take by mouth. 4 mg in the morning and 8 mg at bedtime    . Cholecalciferol (VITAMIN D3) 1000 UNITS tablet Take 1,000 Units by mouth daily.      . clindamycin (CLEOCIN) 300 MG capsule Take 300 mg by mouth. Before dental work    . dextromethorphan (DELSYM) 30 MG/5ML liquid Take 60 mg by mouth 2 (two) times daily as needed for cough.    . diclofenac sodium (VOLTAREN) 1 % GEL Apply topically as needed.    [name] EPINEPHrine 0.3 mg/0.3 mL IJ SOAJ injection Inject 0.3 mLs (0.3 mg total) into the muscle once. 2 Device 1  . esomeprazole (NEXIUM) 40 MG capsule Take 1 capsule (40 mg total) by mouth 2 (two) times daily. 180 capsule 3  . fexofenadine (ALLEGRA) 180 MG tablet Take 180 mg by mouth daily.      . furosemide (LASIX) 40 MG tablet TAKE ONE-HALF (1/2) TABLET EVERY MORNING 45 tablet 3  . mometasone (NASONEX) 50 MCG/ACT nasal spray USE 2 SPRAYS NASALLY once daily 51 g 6  . montelukast (SINGULAIR) 10 MG tablet Take 1 tablet (10 mg total) by mouth at bedtime. 90 tablet 3  . Multiple Vitamin (MULTIVITAMIN) tablet Take 1 tablet by mouth daily.    [name]  omalizumab (XOLAIR) 150 MG injection Inject 225 mg into the skin every 14 (fourteen) days. DX: J45.50 4 each 11  . potassium chloride (K-DUR) 10 MEQ tablet TAKE 1 TABLET DAILY 90 tablet 3  . pramipexole (MIRAPEX) 0.125 MG tablet Take 0.375 mg by mouth at bedtime.     . rosuvastatin (CRESTOR) 5 MG tablet Take 1 tablet (5 mg total) by mouth at bedtime. 90 tablet 2  . Spacer/Aero-Holding Chambers (AEROCHAMBER MV) inhaler by Other route. Use as instructed     . telmisartan (MICARDIS) 20 MG tablet TAKE 1 TABLET AT BEDTIME 90 tablet 3  . Tiotropium Bromide Monohydrate (SPIRIVA RESPIMAT) 2.5 MCG/ACT AERS  Inhale 2 puffs into the lungs daily. 3 Inhaler 3  . triamcinolone cream (KENALOG) 0.5 % Apply 1 application topically 3 (three) times daily as needed.    . trolamine salicylate (ASPERCREME) 10 % cream Apply 1 application topically 2 (two) times daily as needed (pain).     . vitamin B-12 (CYANOCOBALAMIN) 500 MCG tablet Take 500 mcg by mouth daily.      [name] Reasons 20 MG TABS tablet Take 20 mg by mouth daily.     [name] doxycycline (VIBRA-TABS) 100 MG tablet Take 1 tablet (100 mg total) by mouth 2 (two) times daily. (Patient not taking: Reported on 07/02/2016) 14 tablet 0  . methylPREDNISolone (MEDROL DOSEPAK) 4 MG TBPK tablet Take as directed (Patient not taking: Reported on 07/02/2016) 21 tablet 0  . methylPREDNISolone (MEDROL) 4 MG tablet Take as directed (Patient not taking: Reported on 07/02/2016) 50 tablet 0   No current facility-administered medications on file prior to visit.     Past Medical History:  Diagnosis Date  . Asthma, severe persistent    pulmologist-- dr Joya Gaskins  . Chronic kidney disease, stage II (mild)   . DDD (degenerative disc disease), cervical   . Diverticulosis   . Edema, lower extremity   . GERD (gastroesophageal reflux disease)   . History of breast cancer    1989  S/P   RIGHT MASTECTOMY ;  NO CHEMORADIATION //   NO RECURRENCE  . History of DVT of lower extremity   . History of shingles   . Hyperlipidemia   . Leg ulcer, left ([place])   . Osteoporosis, unspecified    Knee and hip osteoarthritis bilaterally  . Perennial allergic rhinitis   . Restless legs syndrome (RLS)   . Unspecified essential hypertension     Past Surgical History:  Procedure Laterality Date  . CARDIAC CATHETERIZATION  08/ 01/ 2008   dr Cathie Olden   normal -- EF of 65%  . CARDIOVASCULAR STRESS TEST  05-22-2011   dr Cathie Olden   normal lexiscan perfusion study/  no ischemia/  lvsf  86%  . CATARACT EXTRACTION W/ INTRAOCULAR LENS  IMPLANT, BILATERAL    . CHOLECYSTECTOMY  1993  . INCISION AND DRAINAGE OF  WOUND Left 10/24/2013   Procedure: IRRIGATION AND DEBRIDEMENT OF LEFT LEG WITH PLACEMENT OF ACELL AND WOUND VAC;  Surgeon: Theodoro Kos, DO;  Location: [facility];  Service: Plastics;  Laterality: Left;  . [place]  . MASTECTOMY Bilateral rigth 1989///   left  1990   breast cancer 1989//   fibrocytic disease 1990  . TOTAL ABDOMINAL HYSTERECTOMY W/ BILATERAL SALPINGOOPHORECTOMY  1989   done at same time as right mastectomy  . TOTAL KNEE ARTHROPLASTY Right 10/05/2012   Procedure: RIGHT TOTAL KNEE ARTHROPLASTY;  Surgeon: Mauri Pole, MD;  Location: [facility];  Service: Orthopedics;  Laterality: Right;    Family History  Problem Relation Age of Onset  . Heart attack Mother   . Asthma Mother   . Heart disease Mother   . Hyperlipidemia Mother   . Heart attack Father   . Heart failure Father   . Deep vein thrombosis Father   . Heart disease Father   . Peripheral vascular disease Father     amputation  . Cancer Brother   . Asthma Brother   . Coronary artery disease Sister   . Heart disease Sister     Social History   Social History  . Marital status: Married    Spouse name: N/A  . Number of children: N/A  . Years of education: N/A   Social History Main Topics  . Smoking status: Never Smoker  . Smokeless tobacco: Never Used  . Alcohol use No  . Drug use: No  . Sexual activity: Not Asked   Other Topics Concern  . None   Social History Narrative   Originally from [place]. Always lived in [place]. Has traveled across the country to [place]. 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 No known asbestos exposure (brother with mesothelioma). No pets currently. No bird exposure. No mold exposure. Carpet has been removed from her home.       Objective:   Physical Exam BP 122/64 (BP Location: Left Arm, Patient Position: Sitting, Cuff Size: Normal)   Pulse 96   Ht 5' 4.75" (1.645 m)   Wt 155 lb (70.3 kg)   SpO2 95%    BMI 25.99 kg/m   Gen.: Elderly female. No distress. Appears comfortable. Integument: Warm and dry. Erythema and warmth over her bilateral distal lower extremities. Extremities: No cyanosis or clubbing. Cardiovascular: Mild bilateral lower extremity edema. Regular rate. Unable to appreciate JVD. Pulmonary: Good aeration and clear bilaterally. No accessory muscle use on room air. Speaking in complete sentences. HEENT: Moist mucous membranes. No sinus tenderness to palpation. No nasal turbinate swelling.  PFT 07/02/13: FVC 2.39 L (90%) FEV1 1.77 L (90%) FEV1/FVC 0.74 FEF 25-75 1.32 L (95%) negative bronchodilator response 01/16/16: FVC 2.26 L (84%) FEV1 1.54 L (78%) FEV1/FVC 0.68 FEF 25-75 0.92 L (65%) positive bronchodilator response 10/31/15: FVC 1.88 L (70%) FEV1 1.26 L (63%) FEV1/FVC 0.67 FEF 25-75 0.68 L (43%) negative bronchodilator response 08/03/15: FVC 2.29 L (85%) FEV1 1.63 L (81%) FEV1/FVC 0.71 FEF 25-75 1.05 L (73%) positive bronchodilator response TLC 5.97 L (115%) RV 143% ERV 35% DLCO corrected 72% (Hgb 12.1) 10/10/11:FVC 2.21 L (77%) FEV1 1.57 L (74%) FEV1/FVC 0.71 FEF 25-75 1.10 L (64%)  IMAGING CXR PA/LAT 08/17/14 (previously reviewed by me): Hyperinflation with flattening of the hemidiaphragms bilaterally. No focal opacity. Hila are full bilaterally. Heart normal in size. No pleural effusion appreciated.   CT CHEST W/O 07/31/14 (previously reviewed by me): Radiology noted a 1.3cm right thyroid nodule. No pleural effusion or thickening. No pericardial effusion. No pathologic mediastinal adenopathy. 67mm LUL nodule stable since 2007 on my review. No other area of consolidation. Mild apical emphysematous changes. Bilateral upper lobe ground glass nodules as well as a ground glass nodule in right lower lobe. Areas of linear opacity consistent with scar tissue formation.  CARDIAC EKG (05/30/15):  Previously reviewed by me. Normal sinus rhythm. QTc 417 ms. Previous Q-wave in V3 has  resolved. No obvious signs of ischemia. Patient does have early repolarization in V1 & V2 similar to prior  EKG.  TTE (03/16/15): LV normal in size with EF 60-65%. Normal wall motion. Grade 1 diastolic dysfunction. LA & RA normal in size. RV normal in size and function. Pulmonary artery systolic pressure within normal range. Pulmonary artery normal in size. No aortic stenosis or regurgitation. No mitral stenosis or regurgitation. No pulmonic stenosis or regurgitation. Mild tricuspid regurgitation. No pericardial effusion.  LABS 10/31/15 CBC: 5.1/12.7/38.8/2:30 Eosinophil: 0.3 IgE: 425 RAST panel: Negative  11/20/14 Eosinophils: 0.9    Assessment & Plan:  83 y.o. female with underlying severe, persistent asthma, chronic allergic rhinitis, bilateral lower extremity DVT, & GERD with hiatal hernia. Symptomatically the patient's reflux is well controlled. She does have increased symptoms from her allergic rhinitis and we did discuss adjusting her regimen as well as a referral to ENT. The patient wishes to hold off on a referral at this time. Further imaging I feel is quite reasonable given her increased symptoms. As far as her underlying asthma goes it is very well controlled on her current regimen with no exacerbations since last appointment and on my review of her spirometry today shows significant improvement in her pulmonary function without a significant bronchodilator response. I instructed the patient contact my office if her sinus congestion did not improve or she wished to proceed with an ENT referral.  1. Severe, Persistent Asthma: Continuing patient on Spiriva, Singulair, Xolair, & Symbicort. No changes. 2. Chronic Allergic Rhinitis: Checking maxillofacial CT scan without contrast. Holding off on ENT referral for now. Continuing Allegra, Singulair, and Nasonex. 3. Bilateral Lower Extremity DVT: No adverse effect from systemic anticoagulation. Continuing Xarelto indefinitely. 4. GERD w/  Hiatal Hernia: Controlled with Nexium. No changes. 5. Lower extremity cellulitis: Patient to address this with her primary care physician today. I have personally seen him in electronic message to make him aware. 6. Health Maintenance: Unable to tolerate influenza vaccine due to allergy. Previously had severe allergic reaction to pneumonia vaccine as well. 7. Follow-up:  Return to clinic in 6 months or sooner if needed.  Sonia Baller Ashok Cordia, M.D. [facility] Pager:  [phone] After 3pm or if no response, call [phone] 11:17 AM 07/02/16

## 2016-07-02 NOTE — Patient Instructions (Signed)
   Make sure you talk with Dr. Joylene Draft about your legs.  I will see you back in 6 months or sooner if needed.  Call me for a referral to an ENT doctor if your sinus congestion doesn't get better.  TESTS ORDERED: 1. Maxillofacial CT LTD w/o contrast

## 2016-07-02 NOTE — Progress Notes (Signed)
Pt arrived in lobby at 945 in which I was informed pt did not want interpreter by son. Per son he was in interpreter and would interpret for his mother. I then informed him it was in the best interest of the patient to use facility interpreter to prevent any miscommunication of medical information. The pt son stated she still declined. Interpreter was thanked for his services and left.

## 2016-07-02 NOTE — Progress Notes (Signed)
Test reviewed.  

## 2016-07-03 ENCOUNTER — Other Ambulatory Visit: Payer: Self-pay

## 2016-07-03 DIAGNOSIS — M7088 Other soft tissue disorders related to use, overuse and pressure other site: Secondary | ICD-10-CM | POA: Diagnosis not present

## 2016-07-03 DIAGNOSIS — I8310 Varicose veins of unspecified lower extremity with inflammation: Secondary | ICD-10-CM | POA: Diagnosis not present

## 2016-07-03 DIAGNOSIS — R600 Localized edema: Secondary | ICD-10-CM | POA: Diagnosis not present

## 2016-07-03 DIAGNOSIS — Z6826 Body mass index (BMI) 26.0-26.9, adult: Secondary | ICD-10-CM | POA: Diagnosis not present

## 2016-07-03 DIAGNOSIS — I82409 Acute embolism and thrombosis of unspecified deep veins of unspecified lower extremity: Secondary | ICD-10-CM | POA: Diagnosis not present

## 2016-07-03 MED ORDER — ESOMEPRAZOLE MAGNESIUM 40 MG PO CPDR: 40.0000 mg | DELAYED_RELEASE_CAPSULE | Freq: Two times a day (BID) | ORAL | 5 refills | Status: DC

## 2016-07-03 MED ORDER — MONTELUKAST SODIUM 10 MG PO TABS: 10.0000 mg | ORAL_TABLET | Freq: Every day | ORAL | 5 refills | Status: DC

## 2016-07-03 MED ORDER — ALBUTEROL SULFATE HFA 108 (90 BASE) MCG/ACT IN AERS: 2.0000 | INHALATION_SPRAY | Freq: Four times a day (QID) | RESPIRATORY_TRACT | 5 refills | Status: DC | PRN

## 2016-07-04 ENCOUNTER — Ambulatory Visit (HOSPITAL_BASED_OUTPATIENT_CLINIC_OR_DEPARTMENT_OTHER)
Admission: RE | Admit: 2016-07-04 | Discharge: 2016-07-04 | Disposition: A | Discharge status: Home / Self Care | Payer: Medicare Other | Source: Ambulatory Visit | Attending: Pulmonary Disease | Admitting: Pulmonary Disease

## 2016-07-04 ENCOUNTER — Other Ambulatory Visit: Payer: Self-pay

## 2016-07-04 DIAGNOSIS — J324 Chronic pansinusitis: Secondary | ICD-10-CM | POA: Insufficient documentation

## 2016-07-04 DIAGNOSIS — J4550 Severe persistent asthma, uncomplicated: Secondary | ICD-10-CM | POA: Diagnosis not present

## 2016-07-04 DIAGNOSIS — R930 Abnormal findings on diagnostic imaging of skull and head, not elsewhere classified: Secondary | ICD-10-CM

## 2016-07-04 DIAGNOSIS — J0140 Acute pansinusitis, unspecified: Secondary | ICD-10-CM | POA: Insufficient documentation

## 2016-07-08 NOTE — Telephone Encounter (Signed)
#   Vials:4 Arrival Date:07/08/16 Lot [id number] Exp Date:9/21

## 2016-07-09 ENCOUNTER — Ambulatory Visit (INDEPENDENT_AMBULATORY_CARE_PROVIDER_SITE_OTHER): Payer: Medicare Other

## 2016-07-09 DIAGNOSIS — J4520 Mild intermittent asthma, uncomplicated: Secondary | ICD-10-CM | POA: Diagnosis not present

## 2016-07-10 MED ORDER — OMALIZUMAB 150 MG ~~LOC~~ SOLR
300.0000 mg | SUBCUTANEOUS | Status: DC
  Administered 2016-07-09: 300 mg via SUBCUTANEOUS

## 2016-07-10 NOTE — Progress Notes (Signed)
Documentation of medication administration of Xolair and charges have been completed by Navah Grondin, CMA based on the Xolair documentation sheet completed by Tammy Scott.  

## 2016-07-23 ENCOUNTER — Ambulatory Visit (INDEPENDENT_AMBULATORY_CARE_PROVIDER_SITE_OTHER): Payer: Medicare Other

## 2016-07-23 DIAGNOSIS — J4540 Moderate persistent asthma, uncomplicated: Secondary | ICD-10-CM

## 2016-07-24 DIAGNOSIS — J324 Chronic pansinusitis: Secondary | ICD-10-CM | POA: Diagnosis not present

## 2016-07-24 MED ORDER — OMALIZUMAB 150 MG ~~LOC~~ SOLR: 225.0000 mg | SUBCUTANEOUS | Status: DC

## 2016-07-24 MED ORDER — OMALIZUMAB 150 MG ~~LOC~~ SOLR
300.0000 mg | SUBCUTANEOUS | Status: DC
  Administered 2016-07-23: 300 mg via SUBCUTANEOUS

## 2016-07-24 NOTE — Progress Notes (Signed)
Xolair injection documentation and charges entered by Ashley Caulfield, RMA, based on injection sheet filled out by Tammy Scott per office protocol.   

## 2016-07-29 ENCOUNTER — Telehealth: Payer: Self-pay | Admitting: Pulmonary Disease

## 2016-07-29 NOTE — Telephone Encounter (Signed)
#   vials: 4 Ordered date:07/29/16 Shipping Date:07/30/16

## 2016-07-31 NOTE — Telephone Encounter (Signed)
#   Vials:4 Arrival Date:07/31/16 Lot #[id number] Exp Date:9/21

## 2016-08-06 ENCOUNTER — Ambulatory Visit (INDEPENDENT_AMBULATORY_CARE_PROVIDER_SITE_OTHER): Payer: Medicare Other

## 2016-08-06 DIAGNOSIS — J4550 Severe persistent asthma, uncomplicated: Secondary | ICD-10-CM

## 2016-08-11 MED ORDER — OMALIZUMAB 150 MG ~~LOC~~ SOLR
300.0000 mg | Freq: Once | SUBCUTANEOUS | Status: AC
  Administered 2016-08-06: 300 mg via SUBCUTANEOUS

## 2016-08-20 ENCOUNTER — Ambulatory Visit: Payer: Medicare Other

## 2016-08-20 DIAGNOSIS — T361X5A Adverse effect of cephalosporins and other beta-lactam antibiotics, initial encounter: Secondary | ICD-10-CM | POA: Diagnosis not present

## 2016-08-20 DIAGNOSIS — T360X5A Adverse effect of penicillins, initial encounter: Secondary | ICD-10-CM | POA: Diagnosis not present

## 2016-08-20 DIAGNOSIS — L271 Localized skin eruption due to drugs and medicaments taken internally: Secondary | ICD-10-CM | POA: Diagnosis not present

## 2016-08-20 DIAGNOSIS — T887XXA Unspecified adverse effect of drug or medicament, initial encounter: Secondary | ICD-10-CM | POA: Diagnosis not present

## 2016-08-20 DIAGNOSIS — Z880 Allergy status to penicillin: Secondary | ICD-10-CM | POA: Diagnosis not present

## 2016-08-20 DIAGNOSIS — Z91012 Allergy to eggs: Secondary | ICD-10-CM | POA: Diagnosis not present

## 2016-08-21 ENCOUNTER — Telehealth: Payer: Self-pay | Admitting: Pulmonary Disease

## 2016-08-21 ENCOUNTER — Ambulatory Visit (INDEPENDENT_AMBULATORY_CARE_PROVIDER_SITE_OTHER): Payer: Medicare Other

## 2016-08-21 DIAGNOSIS — J4550 Severe persistent asthma, uncomplicated: Secondary | ICD-10-CM | POA: Diagnosis not present

## 2016-08-21 NOTE — Telephone Encounter (Signed)
Spoke with pt, who reports she is no longer allergic to penicillins. Pt states she had allergy testing yesterday at [facility] that confirmed this. I have removed this allergy for pt allergy list. Nothing further needed.   Will route to JN as an [place].

## 2016-08-22 MED ORDER — OMALIZUMAB 150 MG ~~LOC~~ SOLR
300.0000 mg | Freq: Once | SUBCUTANEOUS | Status: AC
  Administered 2016-08-21: 300 mg via SUBCUTANEOUS

## 2016-08-25 ENCOUNTER — Telehealth: Payer: Self-pay | Admitting: Pulmonary Disease

## 2016-08-25 NOTE — Telephone Encounter (Signed)
#   vials:4 Ordered date:08/25/16 Shipping Date:08/27/16

## 2016-08-26 ENCOUNTER — Other Ambulatory Visit: Payer: Self-pay | Admitting: Pulmonary Disease

## 2016-08-28 NOTE — Telephone Encounter (Signed)
#   Vials:4 Arrival Date:08/28/16 Lot #[id number] Exp Date:10/21

## 2016-09-01 ENCOUNTER — Ambulatory Visit (INDEPENDENT_AMBULATORY_CARE_PROVIDER_SITE_OTHER): Payer: Medicare Other | Admitting: Pulmonary Disease

## 2016-09-01 ENCOUNTER — Encounter: Payer: Self-pay | Admitting: Pulmonary Disease

## 2016-09-01 VITALS — BP 140/76 | HR 95 | Ht 64.75 in | Wt 147.0 lb

## 2016-09-01 DIAGNOSIS — J4551 Severe persistent asthma with (acute) exacerbation: Secondary | ICD-10-CM

## 2016-09-01 DIAGNOSIS — J0191 Acute recurrent sinusitis, unspecified: Secondary | ICD-10-CM | POA: Diagnosis not present

## 2016-09-01 MED ORDER — BUDESONIDE-FORMOTEROL FUMARATE 160-4.5 MCG/ACT IN AERO: INHALATION_SPRAY | RESPIRATORY_TRACT | 3 refills | Status: DC

## 2016-09-01 MED ORDER — METHYLPREDNISOLONE ACETATE 80 MG/ML IJ SUSP
80.0000 mg | Freq: Once | INTRAMUSCULAR | Status: AC
  Administered 2016-09-01: 80 mg via INTRAMUSCULAR

## 2016-09-01 MED ORDER — TIOTROPIUM BROMIDE MONOHYDRATE 2.5 MCG/ACT IN AERS: 2.0000 | INHALATION_SPRAY | Freq: Every day | RESPIRATORY_TRACT | 3 refills | Status: DC

## 2016-09-01 MED ORDER — EPINEPHRINE 0.3 MG/0.3ML IJ SOAJ: 0.3000 mg | Freq: Once | INTRAMUSCULAR | 1 refills | Status: AC

## 2016-09-01 MED ORDER — AMOXICILLIN-POT CLAVULANATE 875-125 MG PO TABS: 1.0000 | ORAL_TABLET | Freq: Two times a day (BID) | ORAL | 0 refills | Status: DC

## 2016-09-01 NOTE — Patient Instructions (Addendum)
   Remember to take your antibiotic with a meal to keep from upsetting your stomach.  Call me if you have any problems with the antibiotic.   Call me if things don't improve in the next week.  We are keeping your August appointment as scheduled.  You can go ahead and get your Xolair shot as scheduled.

## 2016-09-01 NOTE — Addendum Note (Signed)
Addended by: Tyson Dense on: 09/01/2016 02:41 PM   Modules accepted: Orders

## 2016-09-01 NOTE — Progress Notes (Signed)
Subjective:    Patient ID: [name], female    DOB: February 10, 1933, 83 y.o.   MRN: [id number]  C.C.:  Acute visit for Sinus Congestion with known Severe, Persistent Asthma, Chronic Allergic Rhinitis, DVT, & GERD w/ Hiatal Hernia.  HPI Severe, persistent asthma: Currently prescribed Symbicort, Spiriva, Singulair, and Xolair. She reports intermittent chest congestion & a productive cough. She is having intermittent wheezing.   Chronic allergic rhinitis: Patient referred to ENT at last appointment and was treated with Clindamycin. Prescribed Allegra, Singulair, Nasonex, and Xolair. She reports she was treated with Amoxicillin from [facility] for 4 days without adverse effect. She was tested for Pencillin and reports it was negative for any allergy. She continues to have continued sinus congestion & drainage. Reports drainage is mostly clear.  DVT: Present in bilateral lower extremities. Currently on continuing treatment with Xarelto. No melena, hematochezia  Or hematuria.   GERD with hiatal hernia: Prescribed Nexium. No reflux or dyspepsia.   Review of Systems  No fever, chills, or sweats. No abdominal pain or nausea. No rashes or abnormal bruising.   Allergies  Allergen Reactions  . Eggs Or Egg-Derived Products     RAW EGGS.. Can eat cooked eggs  . Influenza Vaccines   . Nucala [Mepolizumab]   . Cephalosporins Rash  . Codeine Rash  . Levofloxacin Rash  . Pneumococcal Vaccines Rash    Current Outpatient Prescriptions on File Prior to Visit  Medication Sig Dispense Refill  . albuterol (PROAIR HFA) 108 (90 Base) MCG/ACT inhaler Inhale 2 puffs into the lungs every 6 (six) hours as needed for wheezing or shortness of breath. 18 g 5  . albuterol (PROVENTIL) (2.5 MG/3ML) 0.083% nebulizer solution Take 3 mLs (2.5 mg total) by nebulization every 6 (six) hours as needed for wheezing or shortness of breath. Dx: J45.41 360 mL 6  . benzonatate (TESSALON) 100 MG capsule TAKE 1 CAPSULE (100  MG TOTAL) BY MOUTH 3 (THREE) TIMES DAILY AS NEEDED FOR COUGH. 100 capsule 1  . budesonide-formoterol (SYMBICORT) 160-4.5 MCG/ACT inhaler INHALE 2 PUFFS INTO THE LUNGS TWICE A DAY 3 Inhaler 3  . Calcium Carbonate-Vitamin D (CALTRATE 600+D) 600-400 MG-UNIT per tablet Take 1 tablet by mouth daily.     . chlorpheniramine (CHLOR-TRIMETON) 4 MG tablet Take by mouth. 4 mg in the morning and 8 mg at bedtime    . Cholecalciferol (VITAMIN D3) 1000 UNITS tablet Take 1,000 Units by mouth daily.      . clindamycin (CLEOCIN) 300 MG capsule Take 300 mg by mouth. Before dental work    . dextromethorphan (DELSYM) 30 MG/5ML liquid Take 60 mg by mouth 2 (two) times daily as needed for cough.    . diclofenac sodium (VOLTAREN) 1 % GEL Apply topically as needed.    [name] EPINEPHrine 0.3 mg/0.3 mL IJ SOAJ injection Inject 0.3 mLs (0.3 mg total) into the muscle once. 2 Device 1  . esomeprazole (NEXIUM) 40 MG capsule Take 1 capsule (40 mg total) by mouth 2 (two) times daily. Name brand only 30 capsule 5  . fexofenadine (ALLEGRA) 180 MG tablet Take 180 mg by mouth daily.      . furosemide (LASIX) 40 MG tablet TAKE ONE-HALF (1/2) TABLET EVERY MORNING 45 tablet 3  . mometasone (NASONEX) 50 MCG/ACT nasal spray USE 2 SPRAYS NASALLY once daily 51 g 6  . montelukast (SINGULAIR) 10 MG tablet Take 1 tablet (10 mg total) by mouth at bedtime. 30 tablet 5  . Multiple Vitamin (MULTIVITAMIN) tablet  Take 1 tablet by mouth daily.    . potassium chloride (K-DUR) 10 MEQ tablet TAKE 1 TABLET DAILY 90 tablet 3  . pramipexole (MIRAPEX) 0.125 MG tablet Take 0.375 mg by mouth at bedtime.     . rosuvastatin (CRESTOR) 5 MG tablet Take 1 tablet (5 mg total) by mouth at bedtime. 90 tablet 2  . Spacer/Aero-Holding Chambers (AEROCHAMBER MV) inhaler by Other route. Use as instructed     . telmisartan (MICARDIS) 20 MG tablet TAKE 1 TABLET AT BEDTIME 90 tablet 3  . Tiotropium Bromide Monohydrate (SPIRIVA RESPIMAT) 2.5 MCG/ACT AERS Inhale 2 puffs into the  lungs daily. 3 Inhaler 3  . triamcinolone cream (KENALOG) 0.5 % Apply 1 application topically 3 (three) times daily as needed.    . trolamine salicylate (ASPERCREME) 10 % cream Apply 1 application topically 2 (two) times daily as needed (pain).     . vitamin B-12 (CYANOCOBALAMIN) 500 MCG tablet Take 500 mcg by mouth daily.      [name] Reasons 20 MG TABS tablet Take 20 mg by mouth daily.     [name] doxycycline (VIBRA-TABS) 100 MG tablet Take 1 tablet (100 mg total) by mouth 2 (two) times daily. (Patient not taking: Reported on 09/01/2016) 14 tablet 0  . methylPREDNISolone (MEDROL DOSEPAK) 4 MG TBPK tablet Take as directed (Patient not taking: Reported on 09/01/2016) 21 tablet 0  . methylPREDNISolone (MEDROL) 4 MG tablet Take as directed (Patient not taking: Reported on 09/01/2016) 50 tablet 0   No current facility-administered medications on file prior to visit.     Past Medical History:  Diagnosis Date  . Asthma, severe persistent    pulmologist-- dr Joya Gaskins  . Chronic kidney disease, stage II (mild)   . DDD (degenerative disc disease), cervical   . Diverticulosis   . Edema, lower extremity   . GERD (gastroesophageal reflux disease)   . History of breast cancer    1989  S/P   RIGHT MASTECTOMY ;  NO CHEMORADIATION //   NO RECURRENCE  . History of DVT of lower extremity   . History of shingles   . Hyperlipidemia   . Leg ulcer, left ([place])   . Osteoporosis, unspecified    Knee and hip osteoarthritis bilaterally  . Perennial allergic rhinitis   . Restless legs syndrome (RLS)   . Unspecified essential hypertension     Past Surgical History:  Procedure Laterality Date  . CARDIAC CATHETERIZATION  08/ 01/ 2008   dr Cathie Olden   normal -- EF of 65%  . CARDIOVASCULAR STRESS TEST  05-22-2011   dr Cathie Olden   normal lexiscan perfusion study/  no ischemia/  lvsf  86%  . CATARACT EXTRACTION W/ INTRAOCULAR LENS  IMPLANT, BILATERAL    . CHOLECYSTECTOMY  1993  . INCISION AND DRAINAGE OF WOUND Left 10/24/2013    Procedure: IRRIGATION AND DEBRIDEMENT OF LEFT LEG WITH PLACEMENT OF ACELL AND WOUND VAC;  Surgeon: Theodoro Kos, DO;  Location: [facility];  Service: Plastics;  Laterality: Left;  . [place]  . MASTECTOMY Bilateral rigth 1989///   left  1990   breast cancer 1989//   fibrocytic disease 1990  . TOTAL ABDOMINAL HYSTERECTOMY W/ BILATERAL SALPINGOOPHORECTOMY  1989   done at same time as right mastectomy  . TOTAL KNEE ARTHROPLASTY Right 10/05/2012   Procedure: RIGHT TOTAL KNEE ARTHROPLASTY;  Surgeon: Mauri Pole, MD;  Location: [facility];  Service: Orthopedics;  Laterality: Right;  Family History  Problem Relation Age of Onset  . Heart attack Mother   . Asthma Mother   . Heart disease Mother   . Hyperlipidemia Mother   . Heart attack Father   . Heart failure Father   . Deep vein thrombosis Father   . Heart disease Father   . Peripheral vascular disease Father     amputation  . Cancer Brother   . Asthma Brother   . Coronary artery disease Sister   . Heart disease Sister     Social History   Social History  . Marital status: Married    Spouse name: N/A  . Number of children: N/A  . Years of education: N/A   Social History Main Topics  . Smoking status: Never Smoker  . Smokeless tobacco: Never Used  . Alcohol use No  . Drug use: No  . Sexual activity: Not Asked   Other Topics Concern  . None   Social History Narrative   Originally from [place]. Always lived in [place]. Has traveled across the country to [place]. 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 No known asbestos exposure (brother with mesothelioma). No pets currently. No bird exposure. No mold exposure. Carpet has been removed from her home.       Objective:   Physical Exam BP 140/76 (BP Location: Left Arm, Patient Position: Sitting, Cuff Size: Normal)   Pulse 95   Ht 5' 4.75" (1.645 m)   Wt 147 lb (66.7 kg)   SpO2 94%   BMI 24.65 kg/m     Gen.: No distress. Comfortable. Alert. Integument: No rash or bruising on exposed skin. Warm and dry. Pulmonary: Diffuse mild end expiratory wheeze. Wheezing seems to be more prevalent in the apices. Normal work of breathing on room air. Intermittent cough witnessed. HEENT: Moist mucous membranes. No oral ulcers. No sinus tenderness to palpation. Cardiovascular: Regular rate. Trace lower extremity edema. No appreciable JVD. Abdomen: Soft. Nondistended. Normal bowel sounds.  PFT 07/02/13: FVC 2.39 L (90%) FEV1 1.77 L (90%) FEV1/FVC 0.74 FEF 25-75 1.32 L (95%) negative bronchodilator response 01/16/16: FVC 2.26 L (84%) FEV1 1.54 L (78%) FEV1/FVC 0.68 FEF 25-75 0.92 L (65%) positive bronchodilator response 10/31/15: FVC 1.88 L (70%) FEV1 1.26 L (63%) FEV1/FVC 0.67 FEF 25-75 0.68 L (43%) negative bronchodilator response 08/03/15: FVC 2.29 L (85%) FEV1 1.63 L (81%) FEV1/FVC 0.71 FEF 25-75 1.05 L (73%) positive bronchodilator response TLC 5.97 L (115%) RV 143% ERV 35% DLCO corrected 72% (Hgb 12.1) 10/10/11:FVC 2.21 L (77%) FEV1 1.57 L (74%) FEV1/FVC 0.71 FEF 25-75 1.10 L (64%)  IMAGING CXR PA/LAT 08/17/14 (previously reviewed by me): Hyperinflation with flattening of the hemidiaphragms bilaterally. No focal opacity. Hila are full bilaterally. Heart normal in size. No pleural effusion appreciated.   CT CHEST W/O 07/31/14 (previously reviewed by me): Radiology noted a 1.3cm right thyroid nodule. No pleural effusion or thickening. No pericardial effusion. No pathologic mediastinal adenopathy. 8mm LUL nodule stable since 2007 on my review. No other area of consolidation. Mild apical emphysematous changes. Bilateral upper lobe ground glass nodules as well as a ground glass nodule in right lower lobe. Areas of linear opacity consistent with scar tissue formation.  CARDIAC EKG (05/30/15):  Previously reviewed by me. Normal sinus rhythm. QTc 417 ms. Previous Q-wave in V3 has resolved. No obvious signs of  ischemia. Patient does have early repolarization in V1 & V2 similar to prior EKG.  TTE (03/16/15): LV normal in size with  EF 60-65%. Normal wall motion. Grade 1 diastolic dysfunction. LA & RA normal in size. RV normal in size and function. Pulmonary artery systolic pressure within normal range. Pulmonary artery normal in size. No aortic stenosis or regurgitation. No mitral stenosis or regurgitation. No pulmonic stenosis or regurgitation. Mild tricuspid regurgitation. No pericardial effusion.  LABS 10/31/15 CBC: 5.1/12.7/38.8/2:30 Eosinophil: 0.3 IgE: 425 RAST panel: Negative  11/20/14 Eosinophils: 0.9    Assessment & Plan:  83 y.o. female with severe, persistent asthma, chronic allergic rhinitis, bilateral lower extremity DVT, & GERD with hiatal hernia. Patient seems to be having ongoing symptoms from acute recurrent sinusitis. She may be having a mild exacerbation at this time but I feel the main driving factor is her sinusitis. With her previous symptomatic relief utilizing amoxicillin I will initiate a full dose treatment course over 2 weeks. Patient assures me that her allergy testing for penicillin was negative and she tolerated the oral amoxicillin without adverse effect. I struck the patient to notify my office if she had any new breathing problems or symptoms do not significantly improve.  1. Severe, persistent asthma with acute exacerbation: Mild exacerbation. Continuing current inhaler regimen and Xolair. Treating with Depo-Medrol 80 mg IM 1. 2. Acute recurrent sinusitis: Treat with Augmentin 875 mg by mouth twice a day 14 days. 3. Chronic allergic rhinitis: Continuing patient on current regimen. No changes. 4. Bilateral lower extremity DVT: Continuing Xarelto indefinitely. No changes. 5. GERD with hiatal hernia: Continuing Nexium. No changes. 6. Health maintenance: Unable to tolerate influenza vaccine due to allergy & previously had severe allergic reaction to pneumonia vaccine as  well. 7. Follow-up: Return to clinic in August as previously scheduled.  Sonia Baller Ashok Cordia, M.D. [facility] Pulmonary & Critical Care Pager:  [phone] After 3pm or if no response, call [phone] 1:46 PM 09/01/16

## 2016-09-04 ENCOUNTER — Ambulatory Visit (INDEPENDENT_AMBULATORY_CARE_PROVIDER_SITE_OTHER): Payer: Medicare Other

## 2016-09-04 DIAGNOSIS — J4540 Moderate persistent asthma, uncomplicated: Secondary | ICD-10-CM | POA: Diagnosis not present

## 2016-09-05 MED ORDER — OMALIZUMAB 150 MG ~~LOC~~ SOLR
300.0000 mg | SUBCUTANEOUS | Status: DC
  Administered 2016-09-04: 300 mg via SUBCUTANEOUS

## 2016-09-05 NOTE — Progress Notes (Signed)
Xolair injection documentation and charges entered by Ashley Caulfield, RMA, based on injection sheet filled out by Tammy Scott per office protocol.   

## 2016-09-11 ENCOUNTER — Ambulatory Visit: Payer: Self-pay | Admitting: Allergy & Immunology

## 2016-09-16 ENCOUNTER — Telehealth: Payer: Self-pay | Admitting: Pulmonary Disease

## 2016-09-16 MED ORDER — METHYLPREDNISOLONE 4 MG PO TBPK: ORAL_TABLET | ORAL | 0 refills | Status: DC

## 2016-09-16 NOTE — Telephone Encounter (Signed)
Is she no better after 14 d of abx. ???

## 2016-09-16 NOTE — Telephone Encounter (Signed)
Spoke with TP: glad she's doing better after the augmentin.  Okay for Medrol dosepak #1 take as directed, no refills.  If symptoms persist, needs an office visit.  If symptoms worsen, please call the office or seek emergency care.  Called spoke with patient, discussed TP's recommendations as stated above.  Pt okay with this and voiced her understanding.  Rx sent to verified pharmacy.  She is aware to contact the office if she symptoms do not improve or they worsen to seek emergency care.  Nothing further needed at this time; will sign off Will route to JN to make him aware

## 2016-09-16 NOTE — Telephone Encounter (Signed)
Was doing "so much better" until she woke up this morning with allergies.  She stated that she "can tell a world of difference" before and after the augmentin Allergy symptoms include coughing, sneezing, congestion, PND, itchy throat, hoarseness  Pt feels very strongly that medrol will take care of these symptoms  TP please advise, thank you.

## 2016-09-16 NOTE — Telephone Encounter (Signed)
JN prescribed augmentin 875mg  bid on 09/01/16 # 28 during OV for acute sinusitis. Pt states she took last dose of augmentin yesterday with improvement in symptoms . Pt is requesting an Rx for Medrol for allergy symptoms. Pt reports of sneezing, non prod cough & nasal congestion x1d.  Pt taken singulair daily with mild improvement.  TP please advise, as JN has the afternoon off. Thanks.

## 2016-09-18 ENCOUNTER — Ambulatory Visit (INDEPENDENT_AMBULATORY_CARE_PROVIDER_SITE_OTHER): Payer: Medicare Other

## 2016-09-18 DIAGNOSIS — J4550 Severe persistent asthma, uncomplicated: Secondary | ICD-10-CM | POA: Diagnosis not present

## 2016-09-22 MED ORDER — OMALIZUMAB 150 MG ~~LOC~~ SOLR
150.0000 mg | Freq: Once | SUBCUTANEOUS | Status: AC
  Administered 2016-09-18: 150 mg via SUBCUTANEOUS

## 2016-09-24 ENCOUNTER — Telehealth: Payer: Self-pay | Admitting: Pulmonary Disease

## 2016-09-24 NOTE — Telephone Encounter (Signed)
#   vials:4 Ordered date:09/24/16 Shipping Date:09/24/16

## 2016-09-25 NOTE — Telephone Encounter (Signed)
#   Vials:4 Arrival Date:09/25/16 Lot #:[id number] Exp Date:10/21

## 2016-10-02 ENCOUNTER — Ambulatory Visit (INDEPENDENT_AMBULATORY_CARE_PROVIDER_SITE_OTHER): Payer: Medicare Other

## 2016-10-02 DIAGNOSIS — J4550 Severe persistent asthma, uncomplicated: Secondary | ICD-10-CM | POA: Diagnosis not present

## 2016-10-03 MED ORDER — OMALIZUMAB 150 MG ~~LOC~~ SOLR
300.0000 mg | Freq: Once | SUBCUTANEOUS | Status: AC
  Administered 2016-10-02: 300 mg via SUBCUTANEOUS

## 2016-10-14 DIAGNOSIS — H1851 Endothelial corneal dystrophy: Secondary | ICD-10-CM | POA: Diagnosis not present

## 2016-10-14 DIAGNOSIS — E119 Type 2 diabetes mellitus without complications: Secondary | ICD-10-CM | POA: Diagnosis not present

## 2016-10-14 DIAGNOSIS — H40013 Open angle with borderline findings, low risk, bilateral: Secondary | ICD-10-CM | POA: Diagnosis not present

## 2016-10-14 DIAGNOSIS — H04123 Dry eye syndrome of bilateral lacrimal glands: Secondary | ICD-10-CM | POA: Diagnosis not present

## 2016-10-14 DIAGNOSIS — H02834 Dermatochalasis of left upper eyelid: Secondary | ICD-10-CM | POA: Diagnosis not present

## 2016-10-14 DIAGNOSIS — H02831 Dermatochalasis of right upper eyelid: Secondary | ICD-10-CM | POA: Diagnosis not present

## 2016-10-14 DIAGNOSIS — Z961 Presence of intraocular lens: Secondary | ICD-10-CM | POA: Diagnosis not present

## 2016-10-14 DIAGNOSIS — H353131 Nonexudative age-related macular degeneration, bilateral, early dry stage: Secondary | ICD-10-CM | POA: Diagnosis not present

## 2016-10-16 ENCOUNTER — Telehealth: Payer: Self-pay | Admitting: Pulmonary Disease

## 2016-10-16 ENCOUNTER — Ambulatory Visit (INDEPENDENT_AMBULATORY_CARE_PROVIDER_SITE_OTHER): Payer: Medicare Other

## 2016-10-16 DIAGNOSIS — J4520 Mild intermittent asthma, uncomplicated: Secondary | ICD-10-CM | POA: Diagnosis not present

## 2016-10-16 MED ORDER — ESOMEPRAZOLE MAGNESIUM 40 MG PO CPDR: 40.0000 mg | DELAYED_RELEASE_CAPSULE | Freq: Two times a day (BID) | ORAL | 3 refills | Status: DC

## 2016-10-16 NOTE — Telephone Encounter (Signed)
Pt requesting 90 day rx of nexium to be sent to pharmacy.  This has been sent.  Nothing further needed.

## 2016-10-17 MED ORDER — OMALIZUMAB 150 MG ~~LOC~~ SOLR
300.0000 mg | SUBCUTANEOUS | Status: DC
  Administered 2016-10-16: 300 mg via SUBCUTANEOUS

## 2016-10-17 NOTE — Progress Notes (Signed)
Documentation of medication administration of Xolair and charges have been completed by Lindsay Lemons, CMA based on the Xolair documentation sheet completed by Tammy Scott.  

## 2016-10-21 ENCOUNTER — Telehealth: Payer: Self-pay | Admitting: Pulmonary Disease

## 2016-10-21 NOTE — Telephone Encounter (Addendum)
#   vials:4 Ordered date:10/21/16 Shipping Date:10/23/16

## 2016-10-24 NOTE — Telephone Encounter (Signed)
#   Vials:4 Arrival Date:10/24/16 Lot #:[id number] Exp Date:11/21

## 2016-10-30 ENCOUNTER — Ambulatory Visit (INDEPENDENT_AMBULATORY_CARE_PROVIDER_SITE_OTHER): Payer: Medicare Other

## 2016-10-30 DIAGNOSIS — J4550 Severe persistent asthma, uncomplicated: Secondary | ICD-10-CM

## 2016-10-31 MED ORDER — OMALIZUMAB 150 MG ~~LOC~~ SOLR
150.0000 mg | Freq: Once | SUBCUTANEOUS | Status: AC
  Administered 2016-10-30: 150 mg via SUBCUTANEOUS

## 2016-11-03 ENCOUNTER — Telehealth: Payer: Self-pay | Admitting: Pulmonary Disease

## 2016-11-03 NOTE — Telephone Encounter (Signed)
#   vials:4 Ordered date:11/03/16 Shipping Date: 11/17/16 originally request del. 11/06/16 forgot pt. Had a dose on hand. Changed del. Date to 11/18/16.

## 2016-11-13 ENCOUNTER — Ambulatory Visit: Payer: Medicare Other

## 2016-11-13 ENCOUNTER — Ambulatory Visit (INDEPENDENT_AMBULATORY_CARE_PROVIDER_SITE_OTHER): Payer: Medicare Other | Admitting: Adult Health

## 2016-11-13 ENCOUNTER — Encounter: Payer: Self-pay | Admitting: Adult Health

## 2016-11-13 ENCOUNTER — Ambulatory Visit (INDEPENDENT_AMBULATORY_CARE_PROVIDER_SITE_OTHER): Payer: Medicare Other

## 2016-11-13 ENCOUNTER — Telehealth: Payer: Self-pay | Admitting: Adult Health

## 2016-11-13 DIAGNOSIS — J301 Allergic rhinitis due to pollen: Secondary | ICD-10-CM | POA: Diagnosis not present

## 2016-11-13 DIAGNOSIS — J4551 Severe persistent asthma with (acute) exacerbation: Secondary | ICD-10-CM

## 2016-11-13 DIAGNOSIS — J4520 Mild intermittent asthma, uncomplicated: Secondary | ICD-10-CM

## 2016-11-13 MED ORDER — OMALIZUMAB 150 MG ~~LOC~~ SOLR
300.0000 mg | SUBCUTANEOUS | Status: DC
  Administered 2016-11-13: 300 mg via SUBCUTANEOUS

## 2016-11-13 MED ORDER — OMALIZUMAB 150 MG ~~LOC~~ SOLR: 150.0000 mg | SUBCUTANEOUS | Status: DC

## 2016-11-13 MED ORDER — LEVALBUTEROL HCL 0.63 MG/3ML IN NEBU
0.6300 mg | INHALATION_SOLUTION | Freq: Once | RESPIRATORY_TRACT | Status: AC
  Administered 2016-11-13: 0.63 mg via RESPIRATORY_TRACT

## 2016-11-13 MED ORDER — PREDNISONE 10 MG PO TABS: ORAL_TABLET | ORAL | 0 refills | Status: DC

## 2016-11-13 NOTE — Addendum Note (Signed)
Addended by: Parke Poisson E on: 11/13/2016 03:40 PM   Modules accepted: Orders

## 2016-11-13 NOTE — Assessment & Plan Note (Addendum)
Flare -does not appear to have active infection -hold on antibiotics at this time .  Steroid trial  Cont on aggressive asthma/ar regimen  Albuterol refill sent  xopenex neb x 1   Plan  Patient Instructions  Prednisone taper over next week .  Mucinex DM Twice daily  As needed  Cough/congestion  Call if you start to call in discolored mucus .  Continue on  Current regimen .  Follow up with Dr. Ashok Cordia next month as planned and  As needed   Please contact office for sooner follow up if symptoms do not improve or worsen or seek emergency care

## 2016-11-13 NOTE — Progress Notes (Signed)
@[name], female    DOB: [birth year], 83 y.o.   MRN: [id number]  Chief Complaint  Patient presents with  . Acute Visit    Asthma     Referring provider: Crist Infante, MD  HPI: 83 yo female never smoker followed for Severe persistent asthma , AR, sinusitis  , DVT on Xarelto,   TEST  PFT 07/02/13: FVC 2.39 L (90%) FEV1 1.77 L (90%) FEV1/FVC 0.74 FEF 25-75 1.32 L (95%) negative bronchodilator response 01/16/16: FVC 2.26 L (84%) FEV1 1.54 L (78%) FEV1/FVC 0.68 FEF 25-75 0.92 L (65%) positive bronchodilator response 10/31/15: FVC 1.88 L (70%) FEV1 1.26 L (63%) FEV1/FVC 0.67 FEF 25-75 0.68 L (43%) negative bronchodilator response 08/03/15: FVC 2.29 L (85%) FEV1 1.63 L (81%) FEV1/FVC 0.71 FEF 25-75 1.05 L (73%) positive bronchodilator response TLC 5.97 L (115%) RV 143% ERV 35% DLCO corrected 72% (Hgb 12.1) 10/10/11:FVC 2.21 L (77%) FEV1 1.57 L (74%) FEV1/FVC 0.71 FEF 25-75 1.10 L (64%)  11/13/2016 Acute OV : Asthma  Patient presents for an acute office visit. Patient complains of 2 weeks of cough, congestion , drainage, wheezing , and sob . No fever, hemoptysis , chest pain or orthopnea.  Has some congestion but unable to cough anything up. No fever or discolored mucus.  Has run out of her albuterol nebs.  She is on aggressive regimen with Xolair, Symbicort, Spiriva , Nasonex, Allegra, and Chlortrimeton   Allergies  Allergen Reactions  . Eggs Or Egg-Derived Products     RAW EGGS.. Can eat cooked eggs  . Influenza Vaccines   . Nucala [Mepolizumab]   . Cephalosporins Rash  . Codeine Rash  . Levofloxacin Rash  . Pneumococcal Vaccines Rash    Immunization History  Administered Date(s) Administered  . Pneumococcal Conjugate-13 09/14/2012  . Pneumococcal Polysaccharide-23 01/31/2009    Past Medical History:  Diagnosis Date  . Asthma, severe persistent    pulmologist-- dr Joya Gaskins  . Chronic kidney disease, stage II (mild)   . DDD (degenerative disc disease),  cervical   . Diverticulosis   . Edema, lower extremity   . GERD (gastroesophageal reflux disease)   . History of breast cancer    1989  S/P   RIGHT MASTECTOMY ;  NO CHEMORADIATION //   NO RECURRENCE  . History of DVT of lower extremity   . History of shingles   . Hyperlipidemia   . Leg ulcer, left ([place])   . Osteoporosis, unspecified    Knee and hip osteoarthritis bilaterally  . Perennial allergic rhinitis   . Restless legs syndrome (RLS)   . Unspecified essential hypertension     Tobacco History: History  Smoking Status  . Never Smoker  Smokeless Tobacco  . Never Used   Counseling given: Not Answered   Outpatient Encounter Prescriptions as of 11/13/2016  Medication Sig  . albuterol (PROAIR HFA) 108 (90 Base) MCG/ACT inhaler Inhale 2 puffs into the lungs every 6 (six) hours as needed for wheezing or shortness of breath.  [name] albuterol (PROVENTIL) (2.5 MG/3ML) 0.083% nebulizer solution Take 3 mLs (2.5 mg total) by nebulization every 6 (six) hours as needed for wheezing or shortness of breath. Dx: J45.41  . benzonatate (TESSALON) 100 MG capsule TAKE 1 CAPSULE (100 MG TOTAL) BY MOUTH 3 (THREE) TIMES DAILY AS NEEDED FOR COUGH.  . budesonide-formoterol (SYMBICORT) 160-4.5 MCG/ACT inhaler INHALE 2 PUFFS INTO THE LUNGS TWICE A DAY  . Calcium Carbonate-Vitamin D (CALTRATE 600+D) 600-400 MG-UNIT per tablet Take 1 tablet by  mouth daily.   . chlorpheniramine (CHLOR-TRIMETON) 4 MG tablet Take by mouth. 4 mg in the morning and 8 mg at bedtime  . Cholecalciferol (VITAMIN D3) 1000 UNITS tablet Take 1,000 Units by mouth daily.    . clindamycin (CLEOCIN) 300 MG capsule Take 300 mg by mouth. Before dental work  . dextromethorphan (DELSYM) 30 MG/5ML liquid Take 60 mg by mouth 2 (two) times daily as needed for cough.  . diclofenac sodium (VOLTAREN) 1 % GEL Apply topically as needed.  [name] esomeprazole (NEXIUM) 40 MG capsule Take 1 capsule (40 mg total) by mouth 2 (two) times daily. Name brand only  .  fexofenadine (ALLEGRA) 180 MG tablet Take 180 mg by mouth daily.    . furosemide (LASIX) 40 MG tablet TAKE ONE-HALF (1/2) TABLET EVERY MORNING  . mometasone (NASONEX) 50 MCG/ACT nasal spray USE 2 SPRAYS NASALLY once daily  . montelukast (SINGULAIR) 10 MG tablet Take 1 tablet (10 mg total) by mouth at bedtime.  . Multiple Vitamin (MULTIVITAMIN) tablet Take 1 tablet by mouth daily.  . potassium chloride (K-DUR) 10 MEQ tablet TAKE 1 TABLET DAILY  . pramipexole (MIRAPEX) 0.125 MG tablet Take 0.375 mg by mouth at bedtime.   . rosuvastatin (CRESTOR) 5 MG tablet Take 1 tablet (5 mg total) by mouth at bedtime.  [name] Spacer/Aero-Holding Chambers (AEROCHAMBER MV) inhaler by Other route. Use as instructed   . telmisartan (MICARDIS) 20 MG tablet TAKE 1 TABLET AT BEDTIME  . Tiotropium Bromide Monohydrate (SPIRIVA RESPIMAT) 2.5 MCG/ACT AERS Inhale 2 puffs into the lungs daily.  [name] trolamine salicylate (ASPERCREME) 10 % cream Apply 1 application topically 2 (two) times daily as needed (pain).   . vitamin B-12 (CYANOCOBALAMIN) 500 MCG tablet Take 500 mcg by mouth daily.    [name] Reasons 20 MG TABS tablet Take 20 mg by mouth daily.   . predniSONE (DELTASONE) 10 MG tablet 4 tabs for 2 days, then 3 tabs for 2 days, 2 tabs for 2 days, then 1 tab for 2 days, then stop  . [DISCONTINUED] amoxicillin-clavulanate (AUGMENTIN) 875-125 MG tablet Take 1 tablet by mouth 2 (two) times daily. (Patient not taking: Reported on 11/13/2016)  . [DISCONTINUED] doxycycline (VIBRA-TABS) 100 MG tablet Take 1 tablet (100 mg total) by mouth 2 (two) times daily. (Patient not taking: Reported on 09/01/2016)  . [DISCONTINUED] methylPREDNISolone (MEDROL DOSEPAK) 4 MG TBPK tablet follow package directions (Patient not taking: Reported on 11/13/2016)  . [DISCONTINUED] triamcinolone cream (KENALOG) 0.5 % Apply 1 application topically 3 (three) times daily as needed.   No facility-administered encounter medications on file as of 11/13/2016.      Review of  Systems  Constitutional:   No  weight loss, night sweats,  Fevers, chills, fatigue, or  lassitude.  HEENT:   No headaches,  Difficulty swallowing,  Tooth/dental problems, or  Sore throat,                No sneezing, itching, ear ache,  +nasal congestion, post nasal drip,   CV:  No chest pain,  Orthopnea, PND, swelling in lower extremities, anasarca, dizziness, palpitations, syncope.   GI  No heartburn, indigestion, abdominal pain, nausea, vomiting, diarrhea, change in bowel habits, loss of appetite, bloody stools.   Resp:  .  No chest wall deformity  Skin: no rash or lesions.  GU: no dysuria, change in color of urine, no urgency or frequency.  No flank pain, no hematuria   MS:  No joint pain or swelling.  No decreased range  of motion.  No back pain.    Physical Exam  BP 126/74 (BP Location: Left Arm, Cuff Size: Normal)   Pulse 93   Ht 5' 4.75" (1.645 m)   Wt 148 lb 3.2 oz (67.2 kg)   SpO2 93%   BMI 24.85 kg/m   GEN: A/Ox3; pleasant , NAD, elderly    HEENT:  [place]/AT,  EACs-clear, TMs-wnl, NOSE-clear, THROAT-clear, no lesions, no postnasal drip or exudate noted.   NECK:  Supple w/ fair ROM; no JVD; normal carotid impulses w/o bruits; no thyromegaly or nodules palpated; no lymphadenopathy.  No stridor   RESP  Few exp wheezing ,  no accessory muscle use, no dullness to percussion, speaks in full sentences   CARD:  RRR, no m/r/g, no peripheral edema, pulses intact, no cyanosis or clubbing.  GI:   Soft & nt; nml bowel sounds; no organomegaly or masses detected.   Musco: Warm bil, no deformities or joint swelling noted.   Neuro: alert, no focal deficits noted.    Skin: Warm, no lesions or rashes     Lab Results:  CBC  BMET  No results found for: BNP  ProBNP No results found for: PROBNP  Imaging: No results found.   Assessment & Plan:   Severe persistent asthma Flare -does not appear to have active infection -hold on antibiotics at this time .  Steroid  trial  Cont on aggressive asthma/ar regimen  Albuterol refill sent  xopenex neb x 1   Plan  Patient Instructions  Prednisone taper over next week .  Mucinex DM Twice daily  As needed  Cough/congestion  Call if you start to call in discolored mucus .  Continue on  Current regimen .  Follow up with Dr. Ashok Cordia next month as planned and  As needed   Please contact office for sooner follow up if symptoms do not improve or worsen or seek emergency care      Allergic rhinitis Flare   Plan  Patient Instructions  Prednisone taper over next week .  Mucinex DM Twice daily  As needed  Cough/congestion  Call if you start to call in discolored mucus .  Continue on  Current regimen .  Follow up with Dr. Ashok Cordia next month as planned and  As needed   Please contact office for sooner follow up if symptoms do not improve or worsen or seek emergency care         Rexene Edison, NP 11/13/2016

## 2016-11-13 NOTE — Progress Notes (Signed)
Note reviewed. Agree with plan.  Sonia Baller Ashok Cordia, M.D. [facility] Pulmonary & Critical Care Pager:  [phone] After 3pm or if no response, call [phone] 3:37 PM 11/13/16

## 2016-11-13 NOTE — Progress Notes (Signed)
Documentation of medication administration and charges of Xolair have been completed by Yarethzy Croak, CMA based on the Xolair documentation sheet completed by Tammy Scott.  

## 2016-11-13 NOTE — Assessment & Plan Note (Signed)
Flare   Plan  Patient Instructions  Prednisone taper over next week .  Mucinex DM Twice daily  As needed  Cough/congestion  Call if you start to call in discolored mucus .  Continue on  Current regimen .  Follow up with Dr. Ashok Cordia next month as planned and  As needed   Please contact office for sooner follow up if symptoms do not improve or worsen or seek emergency care

## 2016-11-13 NOTE — Telephone Encounter (Signed)
Left message for patient to call back before sending in medication.

## 2016-11-13 NOTE — Patient Instructions (Signed)
Prednisone taper over next week .  Mucinex DM Twice daily  As needed  Cough/congestion  Call if you start to call in discolored mucus .  Continue on  Current regimen .  Follow up with Dr. Ashok Cordia next month as planned and  As needed   Please contact office for sooner follow up if symptoms do not improve or worsen or seek emergency care

## 2016-11-14 MED ORDER — ALBUTEROL SULFATE (2.5 MG/3ML) 0.083% IN NEBU: 2.5000 mg | INHALATION_SOLUTION | Freq: Four times a day (QID) | RESPIRATORY_TRACT | 6 refills | Status: DC | PRN

## 2016-11-14 NOTE — Telephone Encounter (Signed)
Called and spoke with pt and she is aware of refill of the albuterol that has been sent to the pharmacy.

## 2016-11-18 NOTE — Telephone Encounter (Signed)
#   Vials:4 Arrival Date:11/18/16 Lot #:[id number] Exp Date:9/21

## 2016-11-24 ENCOUNTER — Other Ambulatory Visit (HOSPITAL_COMMUNITY): Payer: Self-pay | Admitting: Internal Medicine

## 2016-11-24 DIAGNOSIS — Z6826 Body mass index (BMI) 26.0-26.9, adult: Secondary | ICD-10-CM | POA: Diagnosis not present

## 2016-11-24 DIAGNOSIS — M79672 Pain in left foot: Secondary | ICD-10-CM | POA: Diagnosis not present

## 2016-11-24 DIAGNOSIS — R600 Localized edema: Secondary | ICD-10-CM

## 2016-11-25 ENCOUNTER — Ambulatory Visit (HOSPITAL_COMMUNITY)
Admission: RE | Admit: 2016-11-25 | Discharge: 2016-11-25 | Disposition: A | Discharge status: Home / Self Care | Payer: Medicare Other | Source: Ambulatory Visit | Attending: Vascular Surgery | Admitting: Vascular Surgery

## 2016-11-25 ENCOUNTER — Other Ambulatory Visit (HOSPITAL_COMMUNITY): Payer: Self-pay | Admitting: Internal Medicine

## 2016-11-25 DIAGNOSIS — M79605 Pain in left leg: Secondary | ICD-10-CM | POA: Insufficient documentation

## 2016-11-25 DIAGNOSIS — R600 Localized edema: Secondary | ICD-10-CM

## 2016-11-25 LAB — VAS US LOWER EXTREMITY ARTERIAL DUPLEX
RIGHT ANT DIST TIBAL SYS PSV: 62 cm/s
RIGHT POST TIB DIST SYS: 124 cm/s
RPERPSV: 76 cm/s
RSFDPSV: -83 cm/s
RSFMPSV: -99 cm/s
RSFPPSV: -125 cm/s

## 2016-11-26 ENCOUNTER — Encounter: Payer: Self-pay | Admitting: Vascular Surgery

## 2016-11-27 ENCOUNTER — Ambulatory Visit (INDEPENDENT_AMBULATORY_CARE_PROVIDER_SITE_OTHER): Payer: Medicare Other

## 2016-11-27 DIAGNOSIS — J4520 Mild intermittent asthma, uncomplicated: Secondary | ICD-10-CM | POA: Diagnosis not present

## 2016-11-28 MED ORDER — OMALIZUMAB 150 MG ~~LOC~~ SOLR
300.0000 mg | SUBCUTANEOUS | Status: DC
  Administered 2016-11-28: 300 mg via SUBCUTANEOUS

## 2016-11-28 NOTE — Progress Notes (Signed)
Documentation of medication administration and charges of Xolair have been completed by Raevin Wierenga, CMA based on the Xolair documentation sheet completed by Tammy Scott.  

## 2016-12-09 DIAGNOSIS — R8299 Other abnormal findings in urine: Secondary | ICD-10-CM | POA: Diagnosis not present

## 2016-12-09 DIAGNOSIS — E038 Other specified hypothyroidism: Secondary | ICD-10-CM | POA: Diagnosis not present

## 2016-12-09 DIAGNOSIS — N390 Urinary tract infection, site not specified: Secondary | ICD-10-CM | POA: Diagnosis not present

## 2016-12-09 DIAGNOSIS — E784 Other hyperlipidemia: Secondary | ICD-10-CM | POA: Diagnosis not present

## 2016-12-09 DIAGNOSIS — R7301 Impaired fasting glucose: Secondary | ICD-10-CM | POA: Diagnosis not present

## 2016-12-09 DIAGNOSIS — I10 Essential (primary) hypertension: Secondary | ICD-10-CM | POA: Diagnosis not present

## 2016-12-09 DIAGNOSIS — M859 Disorder of bone density and structure, unspecified: Secondary | ICD-10-CM | POA: Diagnosis not present

## 2016-12-11 ENCOUNTER — Ambulatory Visit (INDEPENDENT_AMBULATORY_CARE_PROVIDER_SITE_OTHER): Payer: Medicare Other

## 2016-12-11 ENCOUNTER — Ambulatory Visit: Payer: Medicare Other

## 2016-12-11 DIAGNOSIS — J4540 Moderate persistent asthma, uncomplicated: Secondary | ICD-10-CM

## 2016-12-12 MED ORDER — OMALIZUMAB 150 MG ~~LOC~~ SOLR
300.0000 mg | SUBCUTANEOUS | Status: DC
  Administered 2016-12-11: 300 mg via SUBCUTANEOUS

## 2016-12-12 NOTE — Progress Notes (Signed)
Documentation of medication administration and charges of Xolair have been completed by Aislin Onofre, CMA based on the Xolair documentation sheet completed by Tammy Scott.  

## 2016-12-16 DIAGNOSIS — J3089 Other allergic rhinitis: Secondary | ICD-10-CM | POA: Diagnosis not present

## 2016-12-16 DIAGNOSIS — N183 Chronic kidney disease, stage 3 (moderate): Secondary | ICD-10-CM | POA: Diagnosis not present

## 2016-12-16 DIAGNOSIS — Z1231 Encounter for screening mammogram for malignant neoplasm of breast: Secondary | ICD-10-CM | POA: Diagnosis not present

## 2016-12-16 DIAGNOSIS — Z0000 Encounter for general adult medical examination without abnormal findings: Secondary | ICD-10-CM | POA: Diagnosis not present

## 2016-12-16 DIAGNOSIS — C50919 Malignant neoplasm of unspecified site of unspecified female breast: Secondary | ICD-10-CM | POA: Diagnosis not present

## 2016-12-16 DIAGNOSIS — R7301 Impaired fasting glucose: Secondary | ICD-10-CM | POA: Diagnosis not present

## 2016-12-16 DIAGNOSIS — M79672 Pain in left foot: Secondary | ICD-10-CM | POA: Diagnosis not present

## 2016-12-16 DIAGNOSIS — I8310 Varicose veins of unspecified lower extremity with inflammation: Secondary | ICD-10-CM | POA: Diagnosis not present

## 2016-12-16 DIAGNOSIS — Z6826 Body mass index (BMI) 26.0-26.9, adult: Secondary | ICD-10-CM | POA: Diagnosis not present

## 2016-12-16 DIAGNOSIS — I82409 Acute embolism and thrombosis of unspecified deep veins of unspecified lower extremity: Secondary | ICD-10-CM | POA: Diagnosis not present

## 2016-12-16 DIAGNOSIS — R600 Localized edema: Secondary | ICD-10-CM | POA: Diagnosis not present

## 2016-12-16 DIAGNOSIS — Z1389 Encounter for screening for other disorder: Secondary | ICD-10-CM | POA: Diagnosis not present

## 2016-12-17 ENCOUNTER — Telehealth: Payer: Self-pay | Admitting: Pulmonary Disease

## 2016-12-17 NOTE — Telephone Encounter (Signed)
#   vials:4 Ordered date:12/17/16 Shipping Date:12/18/16

## 2016-12-19 ENCOUNTER — Inpatient Hospital Stay (HOSPITAL_COMMUNITY)
Admission: EM | Admit: 2016-12-19 | Discharge: 2016-12-26 | DRG: 470 | Disposition: A | Discharge status: Skilled Nursing Facility | Payer: Medicare Other | Attending: Internal Medicine | Admitting: Internal Medicine

## 2016-12-19 ENCOUNTER — Emergency Department (HOSPITAL_COMMUNITY): Payer: Medicare Other

## 2016-12-19 ENCOUNTER — Encounter (HOSPITAL_COMMUNITY): Payer: Self-pay

## 2016-12-19 DIAGNOSIS — R011 Cardiac murmur, unspecified: Secondary | ICD-10-CM | POA: Diagnosis present

## 2016-12-19 DIAGNOSIS — J3089 Other allergic rhinitis: Secondary | ICD-10-CM | POA: Diagnosis present

## 2016-12-19 DIAGNOSIS — M1990 Unspecified osteoarthritis, unspecified site: Secondary | ICD-10-CM | POA: Diagnosis not present

## 2016-12-19 DIAGNOSIS — Z01818 Encounter for other preprocedural examination: Secondary | ICD-10-CM | POA: Diagnosis not present

## 2016-12-19 DIAGNOSIS — I130 Hypertensive heart and chronic kidney disease with heart failure and stage 1 through stage 4 chronic kidney disease, or unspecified chronic kidney disease: Secondary | ICD-10-CM | POA: Diagnosis not present

## 2016-12-19 DIAGNOSIS — M810 Age-related osteoporosis without current pathological fracture: Secondary | ICD-10-CM | POA: Diagnosis present

## 2016-12-19 DIAGNOSIS — E663 Overweight: Secondary | ICD-10-CM | POA: Diagnosis not present

## 2016-12-19 DIAGNOSIS — R112 Nausea with vomiting, unspecified: Secondary | ICD-10-CM | POA: Diagnosis not present

## 2016-12-19 DIAGNOSIS — R609 Edema, unspecified: Secondary | ICD-10-CM

## 2016-12-19 DIAGNOSIS — I10 Essential (primary) hypertension: Secondary | ICD-10-CM | POA: Diagnosis present

## 2016-12-19 DIAGNOSIS — Z90710 Acquired absence of both cervix and uterus: Secondary | ICD-10-CM

## 2016-12-19 DIAGNOSIS — Z90722 Acquired absence of ovaries, bilateral: Secondary | ICD-10-CM | POA: Diagnosis not present

## 2016-12-19 DIAGNOSIS — E871 Hypo-osmolality and hyponatremia: Secondary | ICD-10-CM | POA: Diagnosis present

## 2016-12-19 DIAGNOSIS — Z9049 Acquired absence of other specified parts of digestive tract: Secondary | ICD-10-CM

## 2016-12-19 DIAGNOSIS — Z8249 Family history of ischemic heart disease and other diseases of the circulatory system: Secondary | ICD-10-CM

## 2016-12-19 DIAGNOSIS — Z86718 Personal history of other venous thrombosis and embolism: Secondary | ICD-10-CM | POA: Diagnosis not present

## 2016-12-19 DIAGNOSIS — S72042A Displaced fracture of base of neck of left femur, initial encounter for closed fracture: Secondary | ICD-10-CM | POA: Diagnosis not present

## 2016-12-19 DIAGNOSIS — M79602 Pain in left arm: Secondary | ICD-10-CM

## 2016-12-19 DIAGNOSIS — J4550 Severe persistent asthma, uncomplicated: Secondary | ICD-10-CM | POA: Diagnosis not present

## 2016-12-19 DIAGNOSIS — Z79899 Other long term (current) drug therapy: Secondary | ICD-10-CM

## 2016-12-19 DIAGNOSIS — Z9013 Acquired absence of bilateral breasts and nipples: Secondary | ICD-10-CM

## 2016-12-19 DIAGNOSIS — S92153A Displaced avulsion fracture (chip fracture) of unspecified talus, initial encounter for closed fracture: Secondary | ICD-10-CM | POA: Diagnosis not present

## 2016-12-19 DIAGNOSIS — M25552 Pain in left hip: Secondary | ICD-10-CM | POA: Diagnosis not present

## 2016-12-19 DIAGNOSIS — I1310 Hypertensive heart and chronic kidney disease without heart failure, with stage 1 through stage 4 chronic kidney disease, or unspecified chronic kidney disease: Secondary | ICD-10-CM | POA: Diagnosis not present

## 2016-12-19 DIAGNOSIS — I132 Hypertensive heart and chronic kidney disease with heart failure and with stage 5 chronic kidney disease, or end stage renal disease: Secondary | ICD-10-CM | POA: Diagnosis not present

## 2016-12-19 DIAGNOSIS — R600 Localized edema: Secondary | ICD-10-CM | POA: Diagnosis not present

## 2016-12-19 DIAGNOSIS — F419 Anxiety disorder, unspecified: Secondary | ICD-10-CM | POA: Diagnosis not present

## 2016-12-19 DIAGNOSIS — W109XXA Fall (on) (from) unspecified stairs and steps, initial encounter: Secondary | ICD-10-CM | POA: Diagnosis present

## 2016-12-19 DIAGNOSIS — I5032 Chronic diastolic (congestive) heart failure: Secondary | ICD-10-CM | POA: Diagnosis not present

## 2016-12-19 DIAGNOSIS — Z9079 Acquired absence of other genital organ(s): Secondary | ICD-10-CM

## 2016-12-19 DIAGNOSIS — M79622 Pain in left upper arm: Secondary | ICD-10-CM | POA: Diagnosis not present

## 2016-12-19 DIAGNOSIS — Y92009 Unspecified place in unspecified non-institutional (private) residence as the place of occurrence of the external cause: Secondary | ICD-10-CM

## 2016-12-19 DIAGNOSIS — S72002A Fracture of unspecified part of neck of left femur, initial encounter for closed fracture: Secondary | ICD-10-CM | POA: Diagnosis not present

## 2016-12-19 DIAGNOSIS — S72002D Fracture of unspecified part of neck of left femur, subsequent encounter for closed fracture with routine healing: Secondary | ICD-10-CM | POA: Diagnosis not present

## 2016-12-19 DIAGNOSIS — Z01810 Encounter for preprocedural cardiovascular examination: Secondary | ICD-10-CM | POA: Diagnosis not present

## 2016-12-19 DIAGNOSIS — R079 Chest pain, unspecified: Secondary | ICD-10-CM | POA: Diagnosis not present

## 2016-12-19 DIAGNOSIS — Z8349 Family history of other endocrine, nutritional and metabolic diseases: Secondary | ICD-10-CM

## 2016-12-19 DIAGNOSIS — K5900 Constipation, unspecified: Secondary | ICD-10-CM | POA: Diagnosis not present

## 2016-12-19 DIAGNOSIS — T148XXA Other injury of unspecified body region, initial encounter: Secondary | ICD-10-CM | POA: Diagnosis not present

## 2016-12-19 DIAGNOSIS — Z961 Presence of intraocular lens: Secondary | ICD-10-CM | POA: Diagnosis present

## 2016-12-19 DIAGNOSIS — N183 Chronic kidney disease, stage 3 unspecified: Secondary | ICD-10-CM | POA: Diagnosis present

## 2016-12-19 DIAGNOSIS — Z9181 History of falling: Secondary | ICD-10-CM | POA: Diagnosis not present

## 2016-12-19 DIAGNOSIS — M7989 Other specified soft tissue disorders: Secondary | ICD-10-CM | POA: Diagnosis not present

## 2016-12-19 DIAGNOSIS — S299XXA Unspecified injury of thorax, initial encounter: Secondary | ICD-10-CM | POA: Diagnosis not present

## 2016-12-19 DIAGNOSIS — I447 Left bundle-branch block, unspecified: Secondary | ICD-10-CM | POA: Diagnosis present

## 2016-12-19 DIAGNOSIS — I739 Peripheral vascular disease, unspecified: Secondary | ICD-10-CM | POA: Diagnosis present

## 2016-12-19 DIAGNOSIS — R911 Solitary pulmonary nodule: Secondary | ICD-10-CM | POA: Diagnosis not present

## 2016-12-19 DIAGNOSIS — S4992XA Unspecified injury of left shoulder and upper arm, initial encounter: Secondary | ICD-10-CM | POA: Diagnosis not present

## 2016-12-19 DIAGNOSIS — Z96651 Presence of right artificial knee joint: Secondary | ICD-10-CM | POA: Diagnosis present

## 2016-12-19 DIAGNOSIS — Z887 Allergy status to serum and vaccine status: Secondary | ICD-10-CM

## 2016-12-19 DIAGNOSIS — K219 Gastro-esophageal reflux disease without esophagitis: Secondary | ICD-10-CM | POA: Diagnosis not present

## 2016-12-19 DIAGNOSIS — Z7951 Long term (current) use of inhaled steroids: Secondary | ICD-10-CM | POA: Diagnosis not present

## 2016-12-19 DIAGNOSIS — Z471 Aftercare following joint replacement surgery: Secondary | ICD-10-CM | POA: Diagnosis not present

## 2016-12-19 DIAGNOSIS — E785 Hyperlipidemia, unspecified: Secondary | ICD-10-CM | POA: Diagnosis not present

## 2016-12-19 DIAGNOSIS — Z96642 Presence of left artificial hip joint: Secondary | ICD-10-CM | POA: Diagnosis not present

## 2016-12-19 DIAGNOSIS — Z885 Allergy status to narcotic agent status: Secondary | ICD-10-CM

## 2016-12-19 DIAGNOSIS — S72012A Unspecified intracapsular fracture of left femur, initial encounter for closed fracture: Secondary | ICD-10-CM | POA: Diagnosis not present

## 2016-12-19 DIAGNOSIS — Z91012 Allergy to eggs: Secondary | ICD-10-CM

## 2016-12-19 DIAGNOSIS — I2510 Atherosclerotic heart disease of native coronary artery without angina pectoris: Secondary | ICD-10-CM | POA: Diagnosis not present

## 2016-12-19 DIAGNOSIS — W19XXXA Unspecified fall, initial encounter: Secondary | ICD-10-CM | POA: Diagnosis not present

## 2016-12-19 DIAGNOSIS — I119 Hypertensive heart disease without heart failure: Secondary | ICD-10-CM | POA: Diagnosis present

## 2016-12-19 DIAGNOSIS — S40022A Contusion of left upper arm, initial encounter: Secondary | ICD-10-CM | POA: Diagnosis present

## 2016-12-19 DIAGNOSIS — W010XXA Fall on same level from slipping, tripping and stumbling without subsequent striking against object, initial encounter: Secondary | ICD-10-CM | POA: Diagnosis not present

## 2016-12-19 DIAGNOSIS — Z888 Allergy status to other drugs, medicaments and biological substances status: Secondary | ICD-10-CM

## 2016-12-19 DIAGNOSIS — Z7901 Long term (current) use of anticoagulants: Secondary | ICD-10-CM

## 2016-12-19 DIAGNOSIS — G2581 Restless legs syndrome: Secondary | ICD-10-CM | POA: Diagnosis present

## 2016-12-19 DIAGNOSIS — Z881 Allergy status to other antibiotic agents status: Secondary | ICD-10-CM

## 2016-12-19 DIAGNOSIS — I509 Heart failure, unspecified: Secondary | ICD-10-CM | POA: Diagnosis not present

## 2016-12-19 DIAGNOSIS — Z825 Family history of asthma and other chronic lower respiratory diseases: Secondary | ICD-10-CM

## 2016-12-19 DIAGNOSIS — Z853 Personal history of malignant neoplasm of breast: Secondary | ICD-10-CM

## 2016-12-19 LAB — BASIC METABOLIC PANEL
ANION GAP: 9 (ref 5–15)
BUN: 16 mg/dL (ref 6–20)
CALCIUM: 9.3 mg/dL (ref 8.9–10.3)
CO2: 28 mmol/L (ref 22–32)
Chloride: 101 mmol/L (ref 101–111)
Creatinine, Ser: 1.23 mg/dL — ABNORMAL HIGH (ref 0.44–1.00)
GFR, EST AFRICAN AMERICAN: 46 mL/min — AB (ref >60)
GFR, EST NON AFRICAN AMERICAN: 40 mL/min — AB (ref >60)
GLUCOSE: 114 mg/dL — AB (ref 65–99)
POTASSIUM: 3.8 mmol/L (ref 3.5–5.1)
Sodium: 138 mmol/L (ref 135–145)

## 2016-12-19 LAB — CBC WITH DIFFERENTIAL/PLATELET
BASOS PCT: 0 %
Basophils Absolute: 0 10*3/uL (ref 0.0–0.1)
Eosinophils Absolute: 0.3 10*3/uL (ref 0.0–0.7)
Eosinophils Relative: 4 %
HCT: 37.5 % (ref 36.0–46.0)
Hemoglobin: 12.3 g/dL (ref 12.0–15.0)
LYMPHS PCT: 12 %
Lymphs Abs: 1.1 10*3/uL (ref 0.7–4.0)
MCH: 29.9 pg (ref 26.0–34.0)
MCHC: 32.8 g/dL (ref 30.0–36.0)
MCV: 91.2 fL (ref 78.0–100.0)
MONO ABS: 0.7 10*3/uL (ref 0.1–1.0)
Monocytes Relative: 7 %
NEUTROS PCT: 77 %
Neutro Abs: 6.7 10*3/uL (ref 1.7–7.7)
PLATELETS: 284 10*3/uL (ref 150–400)
RBC: 4.11 MIL/uL (ref 3.87–5.11)
RDW: 13 % (ref 11.5–15.5)
WBC: 8.7 10*3/uL (ref 4.0–10.5)

## 2016-12-19 LAB — TYPE AND SCREEN
ABO/RH(D): O POS
ANTIBODY SCREEN: NEGATIVE

## 2016-12-19 LAB — PROTIME-INR
INR: 1.98
PROTHROMBIN TIME: 22.8 s — AB (ref 11.4–15.2)

## 2016-12-19 MED ORDER — ALBUTEROL SULFATE (2.5 MG/3ML) 0.083% IN NEBU: 2.5000 mg | INHALATION_SOLUTION | Freq: Four times a day (QID) | RESPIRATORY_TRACT | Status: DC | PRN

## 2016-12-19 MED ORDER — ROSUVASTATIN CALCIUM 5 MG PO TABS
5.0000 mg | ORAL_TABLET | Freq: Every day | ORAL | Status: DC
  Administered 2016-12-19 – 2016-12-24 (×6): 5 mg via ORAL
  Filled 2016-12-19 (×7): qty 1

## 2016-12-19 MED ORDER — POLYETHYLENE GLYCOL 3350 17 G PO PACK
17.0000 g | PACK | Freq: Every day | ORAL | Status: DC | PRN
  Administered 2016-12-23: 17 g via ORAL
  Filled 2016-12-19: qty 1

## 2016-12-19 MED ORDER — TIOTROPIUM BROMIDE MONOHYDRATE 18 MCG IN CAPS
18.0000 ug | ORAL_CAPSULE | Freq: Every day | RESPIRATORY_TRACT | Status: DC
  Administered 2016-12-19 – 2016-12-26 (×8): 18 ug via RESPIRATORY_TRACT
  Filled 2016-12-19 (×2): qty 5

## 2016-12-19 MED ORDER — PRAMIPEXOLE DIHYDROCHLORIDE 0.25 MG PO TABS
0.3750 mg | ORAL_TABLET | Freq: Every day | ORAL | Status: DC
  Administered 2016-12-19 – 2016-12-26 (×7): 0.375 mg via ORAL
  Filled 2016-12-19 (×8): qty 1

## 2016-12-19 MED ORDER — HYDROCODONE-ACETAMINOPHEN 5-325 MG PO TABS
1.0000 | ORAL_TABLET | Freq: Four times a day (QID) | ORAL | Status: DC | PRN
  Administered 2016-12-19 – 2016-12-20 (×3): 2 via ORAL
  Filled 2016-12-19 (×3): qty 2

## 2016-12-19 MED ORDER — FENTANYL CITRATE (PF) 100 MCG/2ML IJ SOLN
25.0000 ug | INTRAMUSCULAR | Status: DC | PRN
  Administered 2016-12-19: 25 ug via INTRAVENOUS
  Filled 2016-12-19: qty 2

## 2016-12-19 MED ORDER — TIOTROPIUM BROMIDE MONOHYDRATE 2.5 MCG/ACT IN AERS: 2.0000 | INHALATION_SPRAY | Freq: Every day | RESPIRATORY_TRACT | Status: DC

## 2016-12-19 MED ORDER — METHOCARBAMOL 1000 MG/10ML IJ SOLN
500.0000 mg | Freq: Four times a day (QID) | INTRAVENOUS | Status: DC | PRN
  Filled 2016-12-19: qty 5

## 2016-12-19 MED ORDER — ADULT MULTIVITAMIN W/MINERALS CH
1.0000 | ORAL_TABLET | Freq: Every day | ORAL | Status: DC
  Administered 2016-12-20 – 2016-12-26 (×6): 1 via ORAL
  Filled 2016-12-19 (×7): qty 1

## 2016-12-19 MED ORDER — LORATADINE 10 MG PO TABS
10.0000 mg | ORAL_TABLET | Freq: Every day | ORAL | Status: DC
  Administered 2016-12-20 – 2016-12-26 (×6): 10 mg via ORAL
  Filled 2016-12-19 (×6): qty 1

## 2016-12-19 MED ORDER — MOMETASONE FURO-FORMOTEROL FUM 200-5 MCG/ACT IN AERO
2.0000 | INHALATION_SPRAY | Freq: Two times a day (BID) | RESPIRATORY_TRACT | Status: DC
  Administered 2016-12-19 – 2016-12-26 (×13): 2 via RESPIRATORY_TRACT
  Filled 2016-12-19: qty 8.8

## 2016-12-19 MED ORDER — MONTELUKAST SODIUM 10 MG PO TABS
10.0000 mg | ORAL_TABLET | Freq: Every day | ORAL | Status: DC
  Administered 2016-12-19 – 2016-12-24 (×6): 10 mg via ORAL
  Filled 2016-12-19 (×7): qty 1

## 2016-12-19 MED ORDER — FUROSEMIDE 10 MG/ML IJ SOLN
40.0000 mg | Freq: Once | INTRAMUSCULAR | Status: AC
  Administered 2016-12-19: 23:00:00 via INTRAVENOUS
  Filled 2016-12-19: qty 4

## 2016-12-19 MED ORDER — PANTOPRAZOLE SODIUM 40 MG PO TBEC
40.0000 mg | DELAYED_RELEASE_TABLET | Freq: Two times a day (BID) | ORAL | Status: DC
  Administered 2016-12-19 – 2016-12-26 (×12): 40 mg via ORAL
  Filled 2016-12-19 (×15): qty 1

## 2016-12-19 MED ORDER — BENZONATATE 100 MG PO CAPS: 100.0000 mg | ORAL_CAPSULE | Freq: Three times a day (TID) | ORAL | Status: DC | PRN

## 2016-12-19 MED ORDER — METHOCARBAMOL 500 MG PO TABS
500.0000 mg | ORAL_TABLET | Freq: Four times a day (QID) | ORAL | Status: DC | PRN
  Administered 2016-12-19 – 2016-12-26 (×10): 500 mg via ORAL
  Filled 2016-12-19 (×11): qty 1

## 2016-12-19 MED ORDER — SENNA 8.6 MG PO TABS
1.0000 | ORAL_TABLET | Freq: Two times a day (BID) | ORAL | Status: DC
  Administered 2016-12-19 – 2016-12-23 (×8): 8.6 mg via ORAL
  Filled 2016-12-19 (×8): qty 1

## 2016-12-19 MED ORDER — MORPHINE SULFATE (PF) 2 MG/ML IV SOLN
0.5000 mg | INTRAVENOUS | Status: DC | PRN
  Administered 2016-12-19 – 2016-12-20 (×6): 0.5 mg via INTRAVENOUS
  Filled 2016-12-19 (×6): qty 1

## 2016-12-19 NOTE — ED Triage Notes (Addendum)
Per EMS, pt is coming from home after falling while outside. Pt is complaining of 5/10 left hip pain. Pt denies hitting her head and remembers the fall. EMS reports no shortness or rotation. Pt takes xarelto. Pt is AO x4 and usually ambulates with no assistance.

## 2016-12-19 NOTE — Telephone Encounter (Signed)
#   Vials:4 Arrival Date:12/19/16  Lot #:[id number] Exp Date:10/11/2019

## 2016-12-19 NOTE — ED Provider Notes (Signed)
[facility] DEPT Provider Note   CSN: [id number] Arrival date & time: 12/19/16  1523     History   Chief Complaint Chief Complaint  Patient presents with  . Fall; Hip Pain (left)    HPI [name] is a 83 y.o. female.  HPI  Patient presents immediately after a fall w L hip pain.  She recalls the entire event.  She tripped and fell onto her L hip with "all (her) weight." She has not been ambulatory since the fall. No medication taken for pain relief.  Pain is severe, worse w any motion of the leg. No head pain, no neck pain, no weakness anywhere. She did hit her elbow (ipsalateral) but denies any pain in that area or distal L UE. She is on Xarelto.    Past Medical History:  Diagnosis Date  . Asthma, severe persistent    pulmologist-- dr Joya Gaskins  . Chronic kidney disease, stage II (mild)   . DDD (degenerative disc disease), cervical   . Diverticulosis   . Edema, lower extremity   . GERD (gastroesophageal reflux disease)   . History of breast cancer    1989  S/P   RIGHT MASTECTOMY ;  NO CHEMORADIATION //   NO RECURRENCE  . History of DVT of lower extremity   . History of shingles   . Hyperlipidemia   . Leg ulcer, left ([place])   . Osteoporosis, unspecified    Knee and hip osteoarthritis bilaterally  . Perennial allergic rhinitis   . Restless legs syndrome (RLS)   . Unspecified essential hypertension     Patient Active Problem List   Diagnosis Date Noted  . Chest pain 05/30/2015  . DVT (deep venous thrombosis) ([place]) 04/26/2015  . Lung nodule 08/03/2014  . Peripheral vascular disease, unspecified ([place]) 09/12/2013  . Hyperlipidemia 03/03/2013  . Expected blood loss anemia 10/06/2012  . Overweight (BMI 25.0-29.9) 10/06/2012  . S/P right TKA 10/05/2012  . DJD (degenerative joint disease) 05/26/2012  . Chest pressure 05/19/2011  . NONORGANIC SLEEP DISORDER UNSPECIFIED 10/03/2008  . Severe persistent asthma 08/05/2007  . RESTLESS LEGS SYNDROME 04/30/2007  .  Essential hypertension 04/30/2007  . VOCAL CORD DISORDER 04/30/2007  . OSTEOPOROSIS 04/30/2007  . Allergic rhinitis 01/13/2007  . GERD 01/13/2007    Past Surgical History:  Procedure Laterality Date  . CARDIAC CATHETERIZATION  08/ 01/ 2008   dr Cathie Olden   normal -- EF of 65%  . CARDIOVASCULAR STRESS TEST  05-22-2011   dr Cathie Olden   normal lexiscan perfusion study/  no ischemia/  lvsf  86%  . CATARACT EXTRACTION W/ INTRAOCULAR LENS  IMPLANT, BILATERAL    . CHOLECYSTECTOMY  1993  . INCISION AND DRAINAGE OF WOUND Left 10/24/2013   Procedure: IRRIGATION AND DEBRIDEMENT OF LEFT LEG WITH PLACEMENT OF ACELL AND WOUND VAC;  Surgeon: Theodoro Kos, DO;  Location: [facility];  Service: Plastics;  Laterality: Left;  . [place]  . MASTECTOMY Bilateral rigth 1989///   left  1990   breast cancer 1989//   fibrocytic disease 1990  . TOTAL ABDOMINAL HYSTERECTOMY W/ BILATERAL SALPINGOOPHORECTOMY  1989   done at same time as right mastectomy  . TOTAL KNEE ARTHROPLASTY Right 10/05/2012   Procedure: RIGHT TOTAL KNEE ARTHROPLASTY;  Surgeon: Mauri Pole, MD;  Location: [facility];  Service: Orthopedics;  Laterality: Right;    OB History    No data available       Home Medications  Prior to Admission medications   Medication Sig Start Date End Date Taking? Authorizing Provider  albuterol (PROAIR HFA) 108 (90 Base) MCG/ACT inhaler Inhale 2 puffs into the lungs every 6 (six) hours as needed for wheezing or shortness of breath. 07/03/16   Javier Glazier, MD  albuterol (PROVENTIL) (2.5 MG/3ML) 0.083% nebulizer solution Take 3 mLs (2.5 mg total) by nebulization every 6 (six) hours as needed for wheezing or shortness of breath. Dx: J45.41 11/14/16   Javier Glazier, MD  benzonatate (TESSALON) 100 MG capsule TAKE 1 CAPSULE (100 MG TOTAL) BY MOUTH 3 (THREE) TIMES DAILY AS NEEDED FOR COUGH. 08/26/16   Javier Glazier, MD  budesonide-formoterol [facility]) 160-4.5  MCG/ACT inhaler INHALE 2 PUFFS INTO THE LUNGS TWICE A DAY 09/01/16   Javier Glazier, MD  Calcium Carbonate-Vitamin D (CALTRATE 600+D) 600-400 MG-UNIT per tablet Take 1 tablet by mouth daily.     [provider]  chlorpheniramine (CHLOR-TRIMETON) 4 MG tablet Take by mouth. 4 mg in the morning and 8 mg at bedtime 01/12/13   Elsie Stain, MD  Cholecalciferol (VITAMIN D3) 1000 UNITS tablet Take 1,000 Units by mouth daily.      [provider]  clindamycin (CLEOCIN) 300 MG capsule Take 300 mg by mouth. Before dental work 02/28/14   [provider]  dextromethorphan (DELSYM) 30 MG/5ML liquid Take 60 mg by mouth 2 (two) times daily as needed for cough.    [provider]  diclofenac sodium (VOLTAREN) 1 % GEL Apply topically as needed.    [provider]  esomeprazole (NEXIUM) 40 MG capsule Take 1 capsule (40 mg total) by mouth 2 (two) times daily. Name brand only 10/16/16   Javier Glazier, MD  fexofenadine (ALLEGRA) 180 MG tablet Take 180 mg by mouth daily.   12/02/10   Elsie Stain, MD  furosemide (LASIX) 40 MG tablet TAKE ONE-HALF (1/2) TABLET EVERY MORNING 04/14/16   Nahser, Wonda Cheng, MD  mometasone (NASONEX) 50 MCG/ACT nasal spray USE 2 SPRAYS NASALLY once daily 06/25/16   Javier Glazier, MD  montelukast (SINGULAIR) 10 MG tablet Take 1 tablet (10 mg total) by mouth at bedtime. 07/03/16   Javier Glazier, MD  Multiple Vitamin (MULTIVITAMIN) tablet Take 1 tablet by mouth daily.    [provider]  potassium chloride (K-DUR) 10 MEQ tablet TAKE 1 TABLET DAILY 04/14/16   Nahser, Wonda Cheng, MD  pramipexole (MIRAPEX) 0.125 MG tablet Take 0.375 mg by mouth at bedtime.     [provider]  predniSONE (DELTASONE) 10 MG tablet 4 tabs for 2 days, then 3 tabs for 2 days, 2 tabs for 2 days, then 1 tab for 2 days, then stop 11/13/16   Parrett, Tammy S, NP  rosuvastatin (CRESTOR) 5 MG tablet Take 1 tablet (5 mg total) by mouth at bedtime.  02/21/16   Nahser, Wonda Cheng, MD  Spacer/Aero-Holding Chambers (AEROCHAMBER MV) inhaler by Other route. Use as instructed     [provider]  telmisartan (MICARDIS) 20 MG tablet TAKE 1 TABLET AT BEDTIME 04/14/16   Nahser, Wonda Cheng, MD  Tiotropium Bromide Monohydrate (SPIRIVA RESPIMAT) 2.5 MCG/ACT AERS Inhale 2 puffs into the lungs daily. 09/01/16   Javier Glazier, MD  trolamine salicylate (ASPERCREME) 10 % cream Apply 1 application topically 2 (two) times daily as needed (pain).     [provider]  vitamin B-12 (CYANOCOBALAMIN) 500 MCG tablet Take 500 mcg by mouth daily.  [provider]  XARELTO 20 MG TABS tablet Take 20 mg by mouth daily.     [provider]    Family History Family History  Problem Relation Age of Onset  . Heart attack Mother   . Asthma Mother   . Heart disease Mother   . Hyperlipidemia Mother   . Heart attack Father   . Heart failure Father   . Deep vein thrombosis Father   . Heart disease Father   . Peripheral vascular disease Father        amputation  . Cancer Brother   . Asthma Brother   . Coronary artery disease Sister   . Heart disease Sister     Social History Social History  Substance Use Topics  . Smoking status: Never Smoker  . Smokeless tobacco: Never Used  . Alcohol use No     Allergies   Eggs or egg-derived products; Influenza vaccines; Nucala [mepolizumab]; Cephalosporins; Codeine; Levofloxacin; and Pneumococcal vaccines   Review of Systems Review of Systems  Constitutional:       Per HPI, otherwise negative  HENT:       Per HPI, otherwise negative  Respiratory:       Per HPI, otherwise negative  Cardiovascular:       Per HPI, otherwise negative  Gastrointestinal: Negative for vomiting.  Endocrine:       Negative aside from HPI  Genitourinary:       Neg aside from HPI   Musculoskeletal:       Per HPI, otherwise negative  Skin: Negative.   Neurological: Negative for syncope.    Hematological: Bruises/bleeds easily.     Physical Exam Updated Vital Signs SpO2 95%   Physical Exam  Constitutional: She is oriented to person, place, and time. She appears well-developed and well-nourished. No distress.  HENT:  Head: Normocephalic and atraumatic.  Eyes: Conjunctivae and EOM are normal.  Cardiovascular: Normal rate and regular rhythm.   Pulmonary/Chest: Effort normal and breath sounds normal. No stridor. No respiratory distress.  Abdominal: She exhibits no distension.  Musculoskeletal: She exhibits no edema.       Left shoulder: Normal.       Left elbow: Normal.       Left wrist: Normal.       Legs: Neurological: She is alert and oriented to person, place, and time. No cranial nerve deficit.  Skin: Skin is warm and dry.  Psychiatric: She has a normal mood and affect.  Nursing note and vitals reviewed.    ED Treatments / Results  Labs (all labs ordered are listed, but only abnormal results are displayed) Labs Reviewed  BASIC METABOLIC PANEL - Abnormal; Notable for the following:       Result Value   Glucose, Bld 114 (*)    Creatinine, Ser 1.23 (*)    GFR calc non Af Amer 40 (*)    GFR calc Af Amer 46 (*)    All other components within normal limits  PROTIME-INR - Abnormal; Notable for the following:    Prothrombin Time 22.8 (*)    All other components within normal limits  CBC WITH DIFFERENTIAL/PLATELET  CBC  BASIC METABOLIC PANEL  URINALYSIS, ROUTINE W REFLEX MICROSCOPIC  TYPE AND SCREEN    EKG  EKG Interpretation  Date/Time:  Friday December 19 2016 16:57:09 EDT Ventricular Rate:  87 PR Interval:    QRS Duration: 133 QT Interval:  416 QTC Calculation: 501 R Axis:   1 Text Interpretation:  Sinus  rhythm Non-specific intra-ventricular conduction delay  slightly loner QRS compared to prior Abnormal ekg Confirmed by Carmin Muskrat [phone]) on 12/19/2016 5:33:05 PM       Radiology Dg Chest 1 View  Result Date: 12/19/2016 CLINICAL DATA:   Pain following fall EXAM: CHEST 1 VIEW COMPARISON:  August 17, 2014 FINDINGS: There is no edema or consolidation. The heart size and pulmonary vascularity are normal. No adenopathy. No pneumothorax. No fracture evident. IMPRESSION: No edema or consolidation. Electronically Signed   By: Lowella Grip III M.D.   On: 12/19/2016 16:38   Dg Hip Unilat With Pelvis 2-3 Views Left  Result Date: 12/19/2016 CLINICAL DATA:  Pain following fall EXAM: DG HIP (WITH OR WITHOUT PELVIS) 2-3V LEFT COMPARISON:  None. FINDINGS: Frontal pelvis as well as frontal and lateral left hip images were obtained. There is an impacted fracture in the subcapital femoral neck region on the left. No other fracture. No dislocation. There is slight symmetric narrowing of both hip joints. There is mild osteoarthritic change in each sacroiliac joint. IMPRESSION: Impacted left subcapital femoral neck fracture. No other fracture. No dislocation. Mild narrowing of both hip joints as well as mild osteoarthritic change in each sacroiliac joint. Electronically Signed   By: Lowella Grip III M.D.   On: 12/19/2016 16:37    Procedures Procedures (including critical care time)  Medications Ordered in ED Medications  fentaNYL (SUBLIMAZE) injection 25 mcg (not administered)     Initial Impression / Assessment and Plan / ED Course  I have reviewed the triage vital signs and the nursing notes.  Pertinent labs & imaging results that were available during my care of the patient were reviewed by me and considered in my medical decision making (see chart for details).  Elderly female presents after mechanical fall with subsequent left hip pain. Patient is awake, alert, not in distress, but does have pain about the left lateral hip. Patient does have multiple medical issues, but her physical exam, labs, vitals are otherwise reassuring. After I discussed his case with her orthopedic team, the patient was admitted to the hospitalist service for  further evaluation, management, surgical repair.  Final Clinical Impressions(s) / ED Diagnoses   Final diagnoses:  Swelling  Left hip fracture, initial encounter, femoral neck    Carmin Muskrat, MD 12/20/16 0004

## 2016-12-19 NOTE — Progress Notes (Addendum)
Ortho Note  Pt will have operative fixation of the left hip fracture on Monday afternoon with her surgeon Dr. Alvan Dame.  Will need to hold xarelto in preparation for that surgery.    Full consultation note upcoming.

## 2016-12-19 NOTE — H&P (Signed)
[facility] History and Physical  [name] YSA:[id number] DOB: Feb 19, 1933 DOA: 12/19/2016   PCP: Crist Infante, MD  Specialists: Dr. Ashok Cordia is her pulmonologist. She is followed by Dr. Acie Fredrickson with cardiology as well.  Chief Complaint: Fall and pain in the left hip  HPI: [name] is a 83 y.o. female with a past medical history of asthma, chronic diastolic CHF, hypertension, remote history of breast cancer status post mastectomy, history of cardiac catheterization in 2008, with normal coronary arteries, low risk stress test in 2016, history of DVT about 2 years ago for which she continues to be on Xarelto and who was in her usual state of health until this afternoon when she was walking back into her house. She missed a step and fell and could not get up. She had pain in her left hip. She denies any passing out episodes. No chest pain, shortness of breath, either prior to or after the fall. No dizziness or lightheadedness. At baseline, the most she walks is to the mailbox and back. Does not climb stairs. Does not get any chest pain or shortness of breath with these activities. However, patient does have severe persistent asthma. She does mention that over the last 3 weeks she has noticed more leg swelling in both her lower extremities. She takes Lasix which seems to help, but has not been helping for the last 2-3 weeks.  In the emergency department. Patient underwent workup which revealed left femoral neck fracture. Patient will need hospitalization for further management.   Home Medications: Prior to Admission medications   Medication Sig Start Date End Date Taking? Authorizing Provider  albuterol (PROAIR HFA) 108 (90 Base) MCG/ACT inhaler Inhale 2 puffs into the lungs every 6 (six) hours as needed for wheezing or shortness of breath. 07/03/16   Javier Glazier, MD  albuterol (PROVENTIL) (2.5 MG/3ML) 0.083% nebulizer solution Take 3 mLs (2.5 mg total) by nebulization every 6  (six) hours as needed for wheezing or shortness of breath. Dx: J45.41 11/14/16   Javier Glazier, MD  benzonatate (TESSALON) 100 MG capsule TAKE 1 CAPSULE (100 MG TOTAL) BY MOUTH 3 (THREE) TIMES DAILY AS NEEDED FOR COUGH. 08/26/16   Javier Glazier, MD  budesonide-formoterol [facility]) 160-4.5 MCG/ACT inhaler INHALE 2 PUFFS INTO THE LUNGS TWICE A DAY 09/01/16   Javier Glazier, MD  Calcium Carbonate-Vitamin D (CALTRATE 600+D) 600-400 MG-UNIT per tablet Take 1 tablet by mouth daily.     [provider]  chlorpheniramine (CHLOR-TRIMETON) 4 MG tablet Take by mouth. 4 mg in the morning and 8 mg at bedtime 01/12/13   Elsie Stain, MD  Cholecalciferol (VITAMIN D3) 1000 UNITS tablet Take 1,000 Units by mouth daily.      [provider]  clindamycin (CLEOCIN) 300 MG capsule Take 300 mg by mouth. Before dental work 02/28/14   [provider]  dextromethorphan (DELSYM) 30 MG/5ML liquid Take 60 mg by mouth 2 (two) times daily as needed for cough.    [provider]  diclofenac sodium (VOLTAREN) 1 % GEL Apply topically as needed.    [provider]  esomeprazole (NEXIUM) 40 MG capsule Take 1 capsule (40 mg total) by mouth 2 (two) times daily. Name brand only 10/16/16   Javier Glazier, MD  fexofenadine (ALLEGRA) 180 MG tablet Take 180 mg by mouth daily.   12/02/10   Elsie Stain, MD  furosemide (LASIX) 40 MG tablet TAKE ONE-HALF (1/2) TABLET EVERY MORNING 04/14/16   Nahser, Arnette Norris  J, MD  mometasone (NASONEX) 50 MCG/ACT nasal spray USE 2 SPRAYS NASALLY once daily 06/25/16   Javier Glazier, MD  montelukast (SINGULAIR) 10 MG tablet Take 1 tablet (10 mg total) by mouth at bedtime. 07/03/16   Javier Glazier, MD  Multiple Vitamin (MULTIVITAMIN) tablet Take 1 tablet by mouth daily.    [provider]  potassium chloride (K-DUR) 10 MEQ tablet TAKE 1 TABLET DAILY 04/14/16   Nahser, Wonda Cheng, MD  pramipexole (MIRAPEX) 0.125 MG tablet Take 0.375 mg by  mouth at bedtime.     [provider]  predniSONE (DELTASONE) 10 MG tablet 4 tabs for 2 days, then 3 tabs for 2 days, 2 tabs for 2 days, then 1 tab for 2 days, then stop 11/13/16   Parrett, Tammy S, NP  rosuvastatin (CRESTOR) 5 MG tablet Take 1 tablet (5 mg total) by mouth at bedtime. 02/21/16   Nahser, Wonda Cheng, MD  Spacer/Aero-Holding Chambers (AEROCHAMBER MV) inhaler by Other route. Use as instructed     [provider]  telmisartan (MICARDIS) 20 MG tablet TAKE 1 TABLET AT BEDTIME 04/14/16   Nahser, Wonda Cheng, MD  Tiotropium Bromide Monohydrate (SPIRIVA RESPIMAT) 2.5 MCG/ACT AERS Inhale 2 puffs into the lungs daily. 09/01/16   Javier Glazier, MD  trolamine salicylate (ASPERCREME) 10 % cream Apply 1 application topically 2 (two) times daily as needed (pain).     [provider]  vitamin B-12 (CYANOCOBALAMIN) 500 MCG tablet Take 500 mcg by mouth daily.      [provider]  XARELTO 20 MG TABS tablet Take 20 mg by mouth daily.     [provider]    Allergies:  Allergies  Allergen Reactions  . Eggs Or Egg-Derived Products     RAW EGGS.. Can eat cooked eggs  . Influenza Vaccines   . Nucala [Mepolizumab]   . Cephalosporins Rash  . Codeine Rash  . Levofloxacin Rash  . Pneumococcal Vaccines Rash    Past Medical History: Past Medical History:  Diagnosis Date  . Asthma, severe persistent    pulmologist-- dr Joya Gaskins  . Chronic kidney disease, stage II (mild)   . DDD (degenerative disc disease), cervical   . Diverticulosis   . Edema, lower extremity   . GERD (gastroesophageal reflux disease)   . History of breast cancer    1989  S/P   RIGHT MASTECTOMY ;  NO CHEMORADIATION //   NO RECURRENCE  . History of DVT of lower extremity   . History of shingles   . Hyperlipidemia   . Leg ulcer, left ([place])   . Osteoporosis, unspecified    Knee and hip osteoarthritis bilaterally  . Perennial allergic rhinitis   . Restless legs syndrome (RLS)   .  Unspecified essential hypertension     Past Surgical History:  Procedure Laterality Date  . CARDIAC CATHETERIZATION  08/ 01/ 2008   dr Cathie Olden   normal -- EF of 65%  . CARDIOVASCULAR STRESS TEST  05-22-2011   dr Cathie Olden   normal lexiscan perfusion study/  no ischemia/  lvsf  86%  . CATARACT EXTRACTION W/ INTRAOCULAR LENS  IMPLANT, BILATERAL    . CHOLECYSTECTOMY  1993  . INCISION AND DRAINAGE OF WOUND Left 10/24/2013   Procedure: IRRIGATION AND DEBRIDEMENT OF LEFT LEG WITH PLACEMENT OF ACELL AND WOUND VAC;  Surgeon: Theodoro Kos, DO;  Location: [place];  Service: Plastics;  Laterality: Left;  . [place]  1983  . MASTECTOMY Bilateral rigth 1989///   left  1990   breast cancer 1989//   fibrocytic disease 1990  . TOTAL ABDOMINAL HYSTERECTOMY W/ BILATERAL SALPINGOOPHORECTOMY  1989   done at same time as right mastectomy  . TOTAL KNEE ARTHROPLASTY Right 10/05/2012   Procedure: RIGHT TOTAL KNEE ARTHROPLASTY;  Surgeon: Mauri Pole, MD;  Location: [facility];  Service: Orthopedics;  Laterality: Right;    Social History: Lives with her husband. Denies smoking, alcohol use or illicit drug use. Independent with daily activities. Low to Moderate functional capacity.   Family History:  Family History  Problem Relation Age of Onset  . Heart attack Mother   . Asthma Mother   . Heart disease Mother   . Hyperlipidemia Mother   . Heart attack Father   . Heart failure Father   . Deep vein thrombosis Father   . Heart disease Father   . Peripheral vascular disease Father        amputation  . Cancer Brother   . Asthma Brother   . Coronary artery disease Sister   . Heart disease Sister      Review of Systems - History obtained from the patient General ROS: positive for  - fatigue Psychological ROS: negative Ophthalmic ROS: negative ENT ROS: negative Allergy and Immunology ROS: negative Hematological and Lymphatic ROS: negative Endocrine ROS:  negative Respiratory ROS: no cough, shortness of breath, or wheezing Cardiovascular ROS: no chest pain or dyspnea on exertion Gastrointestinal ROS: no abdominal pain, change in bowel habits, or black or bloody stools Genito-Urinary ROS: no dysuria, trouble voiding, or hematuria Musculoskeletal ROS: as in hpi Neurological ROS: no TIA or stroke symptoms Dermatological ROS: negative  Physical Examination  Vitals:   12/19/16 1553 12/19/16 1604  BP:  (!) 155/59  Pulse:  90  Resp:  16  Temp:  98.2 F (36.8 C)  TempSrc:  Oral  SpO2: 95% 93%  Weight:  68.9 kg (152 lb)  Height:  5\' 3"  (1.6 m)    BP (!) 155/59 (BP Location: Left Arm)   Pulse 90   Temp 98.2 F (36.8 C) (Oral)   Resp 16   Ht 5\' 3"  (1.6 m)   Wt 68.9 kg (152 lb)   SpO2 93%   BMI 26.93 kg/m   General appearance: alert, cooperative, appears stated age and no distress Head: Normocephalic, without obvious abnormality, atraumatic Eyes: conjunctivae/corneas clear. PERRL, EOM's intact. Throat: lips, mucosa, and tongue normal; teeth and gums normal Neck: no adenopathy, no carotid bruit, no JVD, supple, symmetrical, trachea midline and thyroid not enlarged, symmetric, no tenderness/mass/nodules Resp: clear to auscultation bilaterally Cardio: S1, S2 is normal, regular. Systolic murmur appreciated over the precordium. No S3, S4. No rubs, murmurs or bruit. Pitting pedal edema noted bilateral lower extremities. GI: soft, non-tender; bowel sounds normal; no masses,  no organomegaly Extremities: edema 1-2+ pitting bilateral lower extremity Pulses: 2+ and symmetric Skin: Skin color, texture, turgor normal. No rashes or lesions Lymph nodes: Cervical, supraclavicular, and axillary nodes normal. Neurologic: Awake and alert. Oriented 3. No focal neurological deficits.   Labs on Admission: I have personally reviewed following labs and imaging studies  CBC:  Recent Labs Lab 12/19/16 1650  WBC 8.7  NEUTROABS 6.7  HGB 12.3   HCT 37.5  MCV 91.2  PLT 174   Basic Metabolic Panel:  Recent Labs Lab 12/19/16 1650  NA 138  K 3.8  CL 101  CO2 28  GLUCOSE 114*  BUN 16  CREATININE 1.23*  CALCIUM 9.3   GFR: Estimated Creatinine Clearance: 33.4 mL/min (A) (by C-G formula based on SCr of 1.23 mg/dL (H)). Coagulation Profile:  Recent Labs Lab 12/19/16 1650  INR 1.98    Radiological Exams on Admission: Dg Chest 1 View  Result Date: 12/19/2016 CLINICAL DATA:  Pain following fall EXAM: CHEST 1 VIEW COMPARISON:  August 17, 2014 FINDINGS: There is no edema or consolidation. The heart size and pulmonary vascularity are normal. No adenopathy. No pneumothorax. No fracture evident. IMPRESSION: No edema or consolidation. Electronically Signed   By: Lowella Grip III M.D.   On: 12/19/2016 16:38   Dg Hip Unilat With Pelvis 2-3 Views Left  Result Date: 12/19/2016 CLINICAL DATA:  Pain following fall EXAM: DG HIP (WITH OR WITHOUT PELVIS) 2-3V LEFT COMPARISON:  None. FINDINGS: Frontal pelvis as well as frontal and lateral left hip images were obtained. There is an impacted fracture in the subcapital femoral neck region on the left. No other fracture. No dislocation. There is slight symmetric narrowing of both hip joints. There is mild osteoarthritic change in each sacroiliac joint. IMPRESSION: Impacted left subcapital femoral neck fracture. No other fracture. No dislocation. Mild narrowing of both hip joints as well as mild osteoarthritic change in each sacroiliac joint. Electronically Signed   By: Lowella Grip III M.D.   On: 12/19/2016 16:37    My interpretation of Electrocardiogram: Sinus rhythm at 87 bpm. Change in axis noted compared to previous EKG. QRS is also more prolonged compared to previous EKG. Possible LBBB. T inversion noted in lead 1 and aVL which is similar to previous EKG.   Problem List  Principal Problem:   Closed left hip fracture (HCC) Active Problems:   Essential hypertension   Severe  persistent asthma   DVT (deep venous thrombosis) (HCC)   Pedal edema   CKD (chronic kidney disease) stage 3, GFR 30-59 ml/min   Assessment: This is a 83 year old Caucasian female with past history as stated earlier, presents after a mechanical fall at home resulting in left hip fracture. She has noted moderate functional capacity. She will be undergoing an intermediate risk procedure. However, EKG does show new changes compared to previous EKG. She also has noticed worsening lower extremity edema over the last few weeks.  Plan: #1 left femoral neck fracture: Management per orthopedics. Patient will need further cardiac workup prior to undergoing surgery. She last took Xarelto this morning, so we will have to wait for that to wash out. Pain control. Hip fracture protocol initiated.  #2 Preoperative evaluation: Cardiac: Based on the [facility] Perioperative Risk Index the patient's estimated risk probability for perioperative myocardial infarction or cardiac arrest is 1.59%. Patient's procedure is intermediate risk. Patient's functional capacity is low to moderate. Patient with a new EKG changes, new lower extremity edema and a systolic murmur which could be new. She needs further cardiac workup. We will order an echocardiogram. We will consult cardiology to evaluate her. Due to anticoagulation surgery will have to be delayed anyway. Pulmonary toilet. Incentive spirometry post operatively.  #3 history of severe persistent asthma: Seems to be stable currently. No evidence for exacerbation. Continue her home medication.  #4 history of chronic diastolic CHF: Her worsening pedal edema over the last few weeks, could suggest a worsening in her heart failure. Follow-up on echocardiogram. Continue Lasix. We will given intravenously for now.  #5 history of lower extremity DVT: She tells me that her last episode was 2 years ago. She continues to be on  anticoagulation. Xarelto will be held for now. Lower extremity  Doppler study though likelihood of DVT is low as she has been on anticoagulation.  #6 chronic kidney disease, stage III: Seems to be at baseline. Continue to monitor closely. Monitor urine output. Check UA.  #7 history of essential hypertension: Elevated blood pressure is most likely due to pain. Continue to monitor BP closely.  DVT Prophylaxis: SCDs Code Status: Full code Family Communication: Discussed with the patient  Consults called: Cardiology. Orthopedics.   Severity of Illness: The appropriate patient status for this patient is INPATIENT. Inpatient status is judged to be reasonable and necessary in order to provide the required intensity of service to ensure the patient's safety. The patient's presenting symptoms, physical exam findings, and initial radiographic and laboratory data in the context of their chronic comorbidities is felt to place them at high risk for further clinical deterioration. Furthermore, it is not anticipated that the patient will be medically stable for discharge from the hospital within 2 midnights of admission. The following factors support the patient status of inpatient.   " The patient's presenting symptoms include fall with left hip pain. " The worrisome physical exam findings include lower extremity edema, systolic murmur. " The initial radiographic and laboratory data are worrisome because of hip fracture. " The chronic co-morbidities include chronic diastolic CHF, history DVT, hypertension.   * I certify that at the point of admission it is my clinical judgment that the patient will require inpatient hospital care spanning beyond 2 midnights from the point of admission due to high intensity of service, high risk for further deterioration and high frequency of surveillance required.*  Further management decisions will depend on results of further testing and patient's response to treatment.   [facility]  [facility] Pager [phone]  If  7PM-7AM, please contact night-coverage [email or web address] Password [email or web address]  12/19/2016, 6:22 PM

## 2016-12-19 NOTE — ED Notes (Signed)
Bed: [facility] Expected date:  Expected time:  Means of arrival:  Comments: EMS fall; nonambulatory

## 2016-12-20 ENCOUNTER — Inpatient Hospital Stay (HOSPITAL_COMMUNITY): Payer: Medicare Other

## 2016-12-20 DIAGNOSIS — Z86718 Personal history of other venous thrombosis and embolism: Secondary | ICD-10-CM

## 2016-12-20 DIAGNOSIS — J4550 Severe persistent asthma, uncomplicated: Secondary | ICD-10-CM

## 2016-12-20 DIAGNOSIS — I2510 Atherosclerotic heart disease of native coronary artery without angina pectoris: Secondary | ICD-10-CM

## 2016-12-20 DIAGNOSIS — R609 Edema, unspecified: Secondary | ICD-10-CM

## 2016-12-20 LAB — CBC
HCT: 36 % (ref 36.0–46.0)
Hemoglobin: 11.8 g/dL — ABNORMAL LOW (ref 12.0–15.0)
MCH: 29.6 pg (ref 26.0–34.0)
MCHC: 32.8 g/dL (ref 30.0–36.0)
MCV: 90.5 fL (ref 78.0–100.0)
Platelets: 219 10*3/uL (ref 150–400)
RBC: 3.98 MIL/uL (ref 3.87–5.11)
RDW: 13.1 % (ref 11.5–15.5)
WBC: 7.9 10*3/uL (ref 4.0–10.5)

## 2016-12-20 LAB — URINALYSIS, ROUTINE W REFLEX MICROSCOPIC
Bacteria, UA: NONE SEEN
Bilirubin Urine: NEGATIVE
GLUCOSE, UA: NEGATIVE mg/dL
HGB URINE DIPSTICK: NEGATIVE
KETONES UR: 5 mg/dL — AB
NITRITE: NEGATIVE
PROTEIN: NEGATIVE mg/dL
Specific Gravity, Urine: 1.009 (ref 1.005–1.030)
pH: 6 (ref 5.0–8.0)

## 2016-12-20 LAB — ECHOCARDIOGRAM COMPLETE
HEIGHTINCHES: 63 in
Weight: 2432 oz

## 2016-12-20 LAB — BASIC METABOLIC PANEL
Anion gap: 10 (ref 5–15)
BUN: 15 mg/dL (ref 6–20)
CHLORIDE: 99 mmol/L — AB (ref 101–111)
CO2: 27 mmol/L (ref 22–32)
Calcium: 8.8 mg/dL — ABNORMAL LOW (ref 8.9–10.3)
Creatinine, Ser: 1.24 mg/dL — ABNORMAL HIGH (ref 0.44–1.00)
GFR calc non Af Amer: 40 mL/min — ABNORMAL LOW (ref >60)
GFR, EST AFRICAN AMERICAN: 46 mL/min — AB (ref >60)
Glucose, Bld: 115 mg/dL — ABNORMAL HIGH (ref 65–99)
POTASSIUM: 3.7 mmol/L (ref 3.5–5.1)
SODIUM: 136 mmol/L (ref 135–145)

## 2016-12-20 MED ORDER — MORPHINE SULFATE (PF) 2 MG/ML IV SOLN
1.0000 mg | INTRAVENOUS | Status: DC | PRN
  Administered 2016-12-20 – 2016-12-21 (×3): 1 mg via INTRAVENOUS
  Filled 2016-12-20 (×3): qty 1

## 2016-12-20 MED ORDER — ONDANSETRON HCL 4 MG/2ML IJ SOLN
4.0000 mg | Freq: Four times a day (QID) | INTRAMUSCULAR | Status: DC | PRN
  Administered 2016-12-20 – 2016-12-23 (×4): 4 mg via INTRAVENOUS
  Filled 2016-12-20 (×4): qty 2

## 2016-12-20 NOTE — Progress Notes (Signed)
    Subjective:   Procedure(s) (LRB): ARTHROPLASTY BIPOLAR HIP (HEMIARTHROPLASTY) LEFT (Left) Patient reports pain as 2 on 0-10 scale. (just laying in bed)  Denies CP or SOB.  Voiding without difficulty. Positive flatus. Objective: Vital signs in last 24 hours: Temp:  [97.6 F (36.4 C)-98.2 F (36.8 C)] 97.6 F (36.4 C) (08/11 0520) Pulse Rate:  [73-97] 79 (08/11 0520) Resp:  [16-20] 18 (08/11 0520) BP: (100-156)/(56-85) 115/56 (08/11 0520) SpO2:  [93 %-100 %] 94 % (08/11 0520) Weight:  [68.9 kg (152 lb)] 68.9 kg (152 lb) (08/10 1604)  Intake/Output from previous day: 08/10 0701 - 08/11 0700 In: -  Out: 575 [Urine:575] Intake/Output this shift: No intake/output data recorded.  Labs:  Recent Labs  12/19/16 1650 12/20/16 0600  HGB 12.3 11.8*    Recent Labs  12/19/16 1650 12/20/16 0600  WBC 8.7 7.9  RBC 4.11 3.98  HCT 37.5 36.0  PLT 284 219    Recent Labs  12/19/16 1650 12/20/16 0600  NA 138 136  K 3.8 3.7  CL 101 99*  CO2 28 27  BUN 16 15  CREATININE 1.23* 1.24*  GLUCOSE 114* 115*  CALCIUM 9.3 8.8*    Recent Labs  12/19/16 1650  INR 1.98    Physical Exam: Neurologically intact ABD soft Sensation intact distally Compartment soft  Assessment/Plan:   Procedure(s) (LRB): ARTHROPLASTY BIPOLAR HIP (HEMIARTHROPLASTY) LEFT (Left) Regular diet Saturday and Sunday NPO after midnight on Sunday Surgery scheduled for Monday with Dr. Alvan Dame [facility] - L[facility]  [facility], Darla Lesches for Dr. Melina Schools [facility] [phone] 12/20/2016, 8:37 AM    Patient ID: [name], female   DOB: 01/12/1933, 83 y.o.   MRN: [id number]

## 2016-12-20 NOTE — Progress Notes (Signed)
[facility] PROGRESS NOTE  Jency Jamison BZJ:[id number] DOB: 05-18-1932 DOA: 12/19/2016  PCP: Crist Infante, MD  Brief History/Interval Summary: 83 year old Caucasian female with a past medical history of asthma, chronic diastolic CHF, hypertension, remote history of breast cancer, history of DVT for which she continues to be on Xarelto presented after a mechanical fall resulted in left hip fracture. Patient was hospitalized for further management.  Reason for Visit: Left hip fracture  Consultants: Orthopedics. Cardiology  Procedures:  Transthoracic echocardiogram Study Conclusions  - Left ventricle: The cavity size was normal. Wall thickness was   increased increased in a pattern of mild to moderate LVH.   Systolic function was normal. The estimated ejection fraction was   in the range of 55% to 60%. Wall motion was normal; there were no   regional wall motion abnormalities. Doppler parameters are   consistent with abnormal left ventricular relaxation (grade 1   diastolic dysfunction). - Ventricular septum: Septal motion showed mild dyssynergy. These   changes are consistent with a left bundle branch block. - Mitral valve: Calcified annulus.   Antibiotics: None  Subjective/Interval History: Patient feels well this morning. Complains of pain in the left hip area. Denies any chest pain, shortness of breath. No nausea, vomiting.  ROS: No headaches  Objective:  Vital Signs  Vitals:   12/20/16 0030 12/20/16 0400 12/20/16 0520 12/20/16 1000  BP: 100/85  (!) 115/56 (!) 117/93  Pulse: 73  79 81  Resp: 18 20 18 18   Temp: 97.9 F (36.6 C)  97.6 F (36.4 C) 98.1 F (36.7 C)  TempSrc: Oral  Oral Oral  SpO2: 93%  94% 99%  Weight:      Height:        Intake/Output Summary (Last 24 hours) at 12/20/16 1313 Last data filed at 12/20/16 0801  Gross per 24 hour  Intake                0 ml  Output              575 ml  Net             -575 ml   Filed Weights   12/19/16 1604  Weight: 68.9 kg (152 lb)    General appearance: alert, cooperative, appears stated age and no distress Resp: clear to auscultation bilaterally Cardio: regular rate and rhythm, S1, S2 normal, no murmur, click, rub or gallop GI: soft, non-tender; bowel sounds normal; no masses,  no organomegaly Extremities: Left lower extremity is externally rotated. Pitting pedal edema noted bilateral lower extremities. Neurologic: No focal deficits  Lab Results:  Data Reviewed: I have personally reviewed following labs and imaging studies  CBC:  Recent Labs Lab 12/19/16 1650 12/20/16 0600  WBC 8.7 7.9  NEUTROABS 6.7  --   HGB 12.3 11.8*  HCT 37.5 36.0  MCV 91.2 90.5  PLT 284 017    Basic Metabolic Panel:  Recent Labs Lab 12/19/16 1650 12/20/16 0600  NA 138 136  K 3.8 3.7  CL 101 99*  CO2 28 27  GLUCOSE 114* 115*  BUN 16 15  CREATININE 1.23* 1.24*  CALCIUM 9.3 8.8*    GFR: Estimated Creatinine Clearance: 33.1 mL/min (A) (by C-G formula based on SCr of 1.24 mg/dL (H)).  Coagulation Profile:  Recent Labs Lab 12/19/16 1650  INR 1.98     Radiology Studies: Dg Chest 1 View  Result Date: 12/19/2016 CLINICAL DATA:  Pain following fall EXAM: CHEST 1 VIEW COMPARISON:  August 17, 2014 FINDINGS: There is no edema or consolidation. The heart size and pulmonary vascularity are normal. No adenopathy. No pneumothorax. No fracture evident. IMPRESSION: No edema or consolidation. Electronically Signed   By: Lowella Grip III M.D.   On: 12/19/2016 16:38   Dg Hip Unilat With Pelvis 2-3 Views Left  Result Date: 12/19/2016 CLINICAL DATA:  Pain following fall EXAM: DG HIP (WITH OR WITHOUT PELVIS) 2-3V LEFT COMPARISON:  None. FINDINGS: Frontal pelvis as well as frontal and lateral left hip images were obtained. There is an impacted fracture in the subcapital femoral neck region on the left. No other fracture. No dislocation. There is slight symmetric narrowing of both hip  joints. There is mild osteoarthritic change in each sacroiliac joint. IMPRESSION: Impacted left subcapital femoral neck fracture. No other fracture. No dislocation. Mild narrowing of both hip joints as well as mild osteoarthritic change in each sacroiliac joint. Electronically Signed   By: Lowella Grip III M.D.   On: 12/19/2016 16:37     Medications:  Scheduled: . loratadine  10 mg Oral Daily  . mometasone-formoterol  2 puff Inhalation BID  . montelukast  10 mg Oral QHS  . multivitamin with minerals  1 tablet Oral Daily  . pantoprazole  40 mg Oral BID  . pramipexole  0.375 mg Oral QHS  . rosuvastatin  5 mg Oral QHS  . senna  1 tablet Oral BID  . tiotropium  18 mcg Inhalation Daily   Continuous: . methocarbamol (ROBAXIN)  IV     WUJ:[id number], benzonatate, HYDROcodone-acetaminophen, methocarbamol **OR** methocarbamol (ROBAXIN)  IV, morphine injection, polyethylene glycol  Assessment/Plan:  Principal Problem:   Closed left hip fracture (HCC) Active Problems:   Hypertensive heart disease without CHF   Severe persistent asthma   History of DVT of lower extremity   CKD (chronic kidney disease) stage 3, GFR 30-59 ml/min   CAD (coronary artery disease), native coronary artery    Left femoral neck fracture This was a result of a mechanical fall. Orthopedics is following. Waiting for Xarelto to washout. Surgery is tentatively planned for Monday afternoon. Pain control.  Preoperative evaluation See H&P for details. Due to new EKG changes and new lower extremity edema an echocardiogram was ordered. Please see report above. Systolic function is normal. No wall motion abnormalities. Cardiology was also consulted and appreciate their input. No need for any further testing at this time prior to surgery.  History of severe persistent asthma Stable currently. Continue her medications. Will need incentive spirometry postoperatively. Will need close monitoring of pulmonary  status.  History of chronic diastolic CHF/pedal edema Echocardiogram does not show any worsening heart failure. Grade 1 diastolic dysfunction noted. Reason for her peripheral edema is not entirely clear. She was given 1 dose of IV Lasix yesterday. Lower extremity Doppler studies pending, although she has been on Xarelto and so DVT is less likely.  History of lower extremity DVT. Last episode of DVT was 2 years ago. Continues to be an anticoagulation with Xarelto. This has been held for surgery. Will likely washout by tomorrow. Surgery is tentatively planned for Monday. Currently no need for bridging therapy with heparin.  Chronic any disease stage III. Seems to be at baseline. Continue to monitor.  History of essential hypertension. Monitor blood pressures closely.  DVT Prophylaxis: SCDs    Code Status: Full code  Family Communication: Discussed with patient  Disposition Plan: Management as outlined above.    LOS: 1 day   [facility] Pager  [phone] 12/20/2016, 1:13 PM  If 7PM-7AM, please contact night-coverage at [email or web address], password [email or web address]

## 2016-12-20 NOTE — Progress Notes (Addendum)
  Echocardiogram 2D Echocardiogram has been performed. Extremely difficult exam due to patient inability to lay in LLD position from hip fracture. Patient breast implants obstructing views.   [name] 12/20/2016, 8:50 AM

## 2016-12-20 NOTE — Progress Notes (Signed)
**  Preliminary report by tech**  Bilateral lower extremity venous duplex completed. There is no obvious evidence of deep or superficial vein thrombosis involving the right and left lower extremities. All clearly visualized vessels appear patent and compressible. There is no evidence of Baker's cysts bilaterally.  12/20/16 2:31 PM [name] RVT

## 2016-12-20 NOTE — Consult Note (Signed)
ORTHOPAEDIC CONSULTATION  REQUESTING PHYSICIAN: Bonnielee Haff, MD  PCP:  Crist Infante, MD  Chief Complaint: Left hip pain following a mechanical fall today.  HPI: [name] is a 83 y.o. female who complains of left hip pain.  She sustained a mechanical fall at her home earlier today. She think she had a misstep while walking into the threshold of the front door. She does not require the assistance of any walking aids at baseline. She was in her normal state of health prior to this fall. She was unable to pull herself to following the fall and did call 911. She was brought to [facility] emergency department and evaluated for the left hip pain. She was found to have a left femoral neck fracture.  She has a established patient of Dr. Adriana Mccallum who performed a right total knee arthroplasty in the past. She is requesting his services for this hip surgery.  Currently she denies any distal paresthesias or motor deficits. She lives alone with her husband and is independent with all ADLs.  Past Medical History:  Diagnosis Date  . Asthma, severe persistent    pulmologist-- dr Joya Gaskins  . Chronic kidney disease, stage II (mild)   . DDD (degenerative disc disease), cervical   . Diverticulosis   . Edema, lower extremity   . GERD (gastroesophageal reflux disease)   . History of breast cancer    1989  S/P   RIGHT MASTECTOMY ;  NO CHEMORADIATION //   NO RECURRENCE  . History of DVT of lower extremity   . History of shingles   . Hyperlipidemia   . Leg ulcer, left ([place])   . Osteoporosis, unspecified    Knee and hip osteoarthritis bilaterally  . Perennial allergic rhinitis   . Restless legs syndrome (RLS)   . Unspecified essential hypertension    Past Surgical History:  Procedure Laterality Date  . CARDIAC CATHETERIZATION  08/ 01/ 2008   dr Cathie Olden   normal -- EF of 65%  . CARDIOVASCULAR STRESS TEST  05-22-2011   dr Cathie Olden   normal lexiscan perfusion study/  no ischemia/  lvsf  86%  .  CATARACT EXTRACTION W/ INTRAOCULAR LENS  IMPLANT, BILATERAL    . CHOLECYSTECTOMY  1993  . INCISION AND DRAINAGE OF WOUND Left 10/24/2013   Procedure: IRRIGATION AND DEBRIDEMENT OF LEFT LEG WITH PLACEMENT OF ACELL AND WOUND VAC;  Surgeon: Theodoro Kos, DO;  Location: [place];  Service: Plastics;  Laterality: Left;  . [place]  . MASTECTOMY Bilateral rigth 1989///   left  1990   breast cancer 1989//   fibrocytic disease 1990  . TOTAL ABDOMINAL HYSTERECTOMY W/ BILATERAL SALPINGOOPHORECTOMY  1989   done at same time as right mastectomy  . TOTAL KNEE ARTHROPLASTY Right 10/05/2012   Procedure: RIGHT TOTAL KNEE ARTHROPLASTY;  Surgeon: Mauri Pole, MD;  Location: [facility];  Service: Orthopedics;  Laterality: Right;   Social History   Social History  . Marital status: Married    Spouse name: N/A  . Number of children: N/A  . Years of education: N/A   Social History Main Topics  . Smoking status: Never Smoker  . Smokeless tobacco: Never Used  . Alcohol use No  . Drug use: No  . Sexual activity: Not Asked   Other Topics Concern  . None   Social History Narrative   Originally from [place]. Always lived in [place]. Has traveled across the country  to [place]. 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 No known asbestos exposure (brother with mesothelioma). No pets currently. No bird exposure. No mold exposure. Carpet has been removed from her home.    Family History  Problem Relation Age of Onset  . Heart attack Mother   . Asthma Mother   . Heart disease Mother   . Hyperlipidemia Mother   . Heart attack Father   . Heart failure Father   . Deep vein thrombosis Father   . Heart disease Father   . Peripheral vascular disease Father        amputation  . Cancer Brother   . Asthma Brother   . Coronary artery disease Sister   . Heart disease Sister    Allergies  Allergen Reactions  . Eggs Or Egg-Derived Products      RAW EGGS.. Can eat cooked eggs  . Influenza Vaccines   . Nucala [Mepolizumab]   . Cephalosporins Rash  . Codeine Rash  . Levofloxacin Rash  . Pneumococcal Vaccines Rash   Prior to Admission medications   Medication Sig Start Date End Date Taking? Authorizing Provider  acetaminophen (TYLENOL) 500 MG tablet Take 500 mg by mouth every 6 (six) hours as needed for mild pain or moderate pain.   Yes [provider]  albuterol (PROAIR HFA) 108 (90 Base) MCG/ACT inhaler Inhale 2 puffs into the lungs every 6 (six) hours as needed for wheezing or shortness of breath. 07/03/16  Yes Javier Glazier, MD  albuterol (PROVENTIL) (2.5 MG/3ML) 0.083% nebulizer solution Take 3 mLs (2.5 mg total) by nebulization every 6 (six) hours as needed for wheezing or shortness of breath. Dx: J45.41 11/14/16  Yes Javier Glazier, MD  benzonatate (TESSALON) 100 MG capsule TAKE 1 CAPSULE (100 MG TOTAL) BY MOUTH 3 (THREE) TIMES DAILY AS NEEDED FOR COUGH. 08/26/16  Yes Javier Glazier, MD  budesonide-formoterol [facility]) 160-4.5 MCG/ACT inhaler INHALE 2 PUFFS INTO THE LUNGS TWICE A DAY 09/01/16  Yes Javier Glazier, MD  Calcium Carbonate-Vitamin D (CALTRATE 600+D) 600-400 MG-UNIT per tablet Take 1 tablet by mouth daily.    Yes [provider]  chlorpheniramine (CHLOR-TRIMETON) 4 MG tablet Take by mouth. 4 mg in the morning and 8 mg at bedtime 01/12/13  Yes Elsie Stain, MD  Cholecalciferol (VITAMIN D3) 1000 UNITS tablet Take 1,000 Units by mouth daily.     Yes [provider]  dextromethorphan (DELSYM) 30 MG/5ML liquid Take 60 mg by mouth 2 (two) times daily as needed for cough.   Yes [provider]  esomeprazole (NEXIUM) 40 MG capsule Take 1 capsule (40 mg total) by mouth 2 (two) times daily. Name brand only Patient taking differently: Take 40 mg by mouth every evening. Name brand only 10/16/16  Yes Nestor, Sonia Baller, MD  fexofenadine (ALLEGRA) 180 MG tablet Take 180 mg by mouth  daily.   12/02/10  Yes Elsie Stain, MD  furosemide (LASIX) 40 MG tablet TAKE ONE-HALF (1/2) TABLET EVERY MORNING 04/14/16  Yes Nahser, Wonda Cheng, MD  mometasone (NASONEX) 50 MCG/ACT nasal spray USE 2 SPRAYS NASALLY once daily 06/25/16  Yes Javier Glazier, MD  montelukast (SINGULAIR) 10 MG tablet Take 1 tablet (10 mg total) by mouth at bedtime. 07/03/16  Yes Javier Glazier, MD  Multiple Vitamin (MULTIVITAMIN) tablet Take 1 tablet by mouth daily.   Yes [provider]  potassium chloride (K-DUR) 10 MEQ tablet TAKE 1 TABLET DAILY 04/14/16  Yes Nahser, Wonda Cheng, MD  pramipexole (MIRAPEX) 0.125 MG tablet Take 0.375 mg by mouth at bedtime.    Yes [provider]  rosuvastatin (CRESTOR) 5 MG tablet Take 1 tablet (5 mg total) by mouth at bedtime. 02/21/16  Yes Nahser, Wonda Cheng, MD  Spacer/Aero-Holding Chambers (AEROCHAMBER MV) inhaler by Other route. Use as instructed    Yes [provider]  telmisartan (MICARDIS) 20 MG tablet TAKE 1 TABLET AT BEDTIME 04/14/16  Yes Nahser, Wonda Cheng, MD  Tiotropium Bromide Monohydrate (SPIRIVA RESPIMAT) 2.5 MCG/ACT AERS Inhale 2 puffs into the lungs daily. 09/01/16  Yes Javier Glazier, MD  vitamin B-12 (CYANOCOBALAMIN) 500 MCG tablet Take 500 mcg by mouth daily.     Yes [provider]  XARELTO 20 MG TABS tablet Take 20 mg by mouth daily.    Yes [provider]  predniSONE (DELTASONE) 10 MG tablet 4 tabs for 2 days, then 3 tabs for 2 days, 2 tabs for 2 days, then 1 tab for 2 days, then stop Patient not taking: Reported on 12/19/2016 11/13/16   Melvenia Needles, NP   Dg Chest 1 View  Result Date: 12/19/2016 CLINICAL DATA:  Pain following fall EXAM: CHEST 1 VIEW COMPARISON:  August 17, 2014 FINDINGS: There is no edema or consolidation. The heart size and pulmonary vascularity are normal. No adenopathy. No pneumothorax. No fracture evident. IMPRESSION: No edema or consolidation. Electronically Signed   By: Lowella Grip III  M.D.   On: 12/19/2016 16:38   Dg Hip Unilat With Pelvis 2-3 Views Left  Result Date: 12/19/2016 CLINICAL DATA:  Pain following fall EXAM: DG HIP (WITH OR WITHOUT PELVIS) 2-3V LEFT COMPARISON:  None. FINDINGS: Frontal pelvis as well as frontal and lateral left hip images were obtained. There is an impacted fracture in the subcapital femoral neck region on the left. No other fracture. No dislocation. There is slight symmetric narrowing of both hip joints. There is mild osteoarthritic change in each sacroiliac joint. IMPRESSION: Impacted left subcapital femoral neck fracture. No other fracture. No dislocation. Mild narrowing of both hip joints as well as mild osteoarthritic change in each sacroiliac joint. Electronically Signed   By: Lowella Grip III M.D.   On: 12/19/2016 16:37    Positive ROS: All other systems have been reviewed and were otherwise negative with the exception of those mentioned in the HPI and as above.  Physical Exam: General: Alert, no acute distress Cardiovascular: No pedal edema Respiratory: No cyanosis, no use of accessory musculature GI: No organomegaly, abdomen is soft and non-tender Skin: No lesions in the area of chief complaint Neurologic: Sensation intact distally Psychiatric: Patient is competent for consent with normal mood and affect Lymphatic: No axillary or cervical lymphadenopathy  MUSCULOSKELETAL:  Left lower extremity:  Tenderness to palpation noted along the greater trochanter. Positive pain with logroll. Range of motion and proximal motor strength not tested given hip fracture. Distally she has sensation intact to light touch in the deep and superficial peroneal nerves as well as sural saphenous and tibial nerve. 2+ dorsalis pedis pulse. Motor is intact with tibialis anterior/gastrocsoleus/flexor hallucis longus/extensor hallucis longus.  Assessment: Left hip valgus impacted femoral neck fracture, closed.  Plan: - This injury pattern will need  operative management. Unfortunately she is fully anticoagulated at this time on Xarelto. That will need to be held we will allow that to drift down over the next 48 hours. We will plan for operative intervention by Dr. Alvan Dame on Monday afternoon. She  may have a diet up until Sunday night at midnight. Recommend SCDs for DVT prophylaxis while the Xarelto washes out.  - Nonweightbearing at this time to the left lower extremity. - The risks, benefits, and alternatives were discussed with the patient. There are risks associated with the surgery including, but not limited to, problems with anesthesia (death), infection, differences in leg length/angulation/rotation, fracture of bones, loosening or failure of implants, malunion, nonunion, hematoma (blood accumulation) which may require surgical drainage, blood clots, pulmonary embolism, nerve injury (foot drop), and blood vessel injury. The patient understands these risks and elects to proceed.   Nicholes Stairs, MD Cell [phone]    12/20/2016 12:00 AM

## 2016-12-20 NOTE — Consult Note (Signed)
Cardiology Consult Note  Admit date: 12/19/2016 Name: [name] 83 y.o.  female DOB:  05-01-1933 MRN:  [id number]  Today's date:  12/20/2016  Referring Physician:    [facility]  Primary Physician:    Joylene Draft  Reason for Consultation:    Encounter for preoperative cardiac evaluation  IMPRESSIONS: 1.  May proceed with the planned repair of her hip fracture.  No additional cardiovascular workup is necessary at this time.  Her surgical risk should be average for her age.  She had a negative myocardial perfusion scan within the past year and a half. 2.  Hypertensive heart disease 3.  Left bundle branch block new 4.  Coronary artery disease as manifested by coronary calcification 5.  Asthma currently controlled 6.  History of recurrent lower extremity DVT previously on anticoagulation 7.  History of stage II chronic kidney disease  RECOMMENDATION: May proceed with hip repair without further cardiac workup.  Her echocardiogram reviewed today shows normal LV function with wall motion abnormality consistent with left bundle branch block.  She had trace tricuspid regurgitation and I did not appreciate a murmur at the present time.  HISTORY: This 83 year old female has a history of hypertensive heart disease, severe asthma and stage II chronic kidney disease.  She had a catheterization about 10 years ago that showed normal coronary arteries.  She has had some intermittent chest pain and had aI cardial perfusion scan inNovember 2016 that was unremarkable.  She had coronary calcification on CT scan at that time.  She normally gets along fairly well but has fairly significant asthma and when she has asthma with intermittently have chest pain.  She had a mechanical fall and fell breaking her hip On the left and is in need of surgery.  Evidently a murmur was heard by her primary doctor recently and has been intermittently heard by the pulmonologist.  She also has lower extremity peripheral edema.   Yesterday she had some asthma but is feeling better today. She denies angina.  She has no PND or orthopnea and is not currently having edema.  She has a history of recurrent lower extremity DVT and is on chronic anticoagulation which has been held.  She has unable to do her normal activities of daily living.    Past Medical History:  Diagnosis Date  . Asthma, severe persistent    pulmologist-- dr Joya Gaskins  . Chronic kidney disease, stage II (mild)   . DDD (degenerative disc disease), cervical   . Diverticulosis   . Edema, lower extremity   . GERD (gastroesophageal reflux disease)   . History of breast cancer    1989  S/P   RIGHT MASTECTOMY ;  NO CHEMORADIATION //   NO RECURRENCE  . History of DVT of lower extremity   . History of shingles   . Hyperlipidemia   . Leg ulcer, left ([place])   . Osteoporosis, unspecified    Knee and hip osteoarthritis bilaterally  . Perennial allergic rhinitis   . Restless legs syndrome (RLS)   . Unspecified essential hypertension      Past Surgical History:  Procedure Laterality Date  . CARDIAC CATHETERIZATION  08/ 01/ 2008   dr Cathie Olden   normal -- EF of 65%  . CARDIOVASCULAR STRESS TEST  05-22-2011   dr Cathie Olden   normal lexiscan perfusion study/  no ischemia/  lvsf  86%  . CATARACT EXTRACTION W/ INTRAOCULAR LENS  IMPLANT, BILATERAL    . CHOLECYSTECTOMY  1993  . INCISION AND DRAINAGE OF  WOUND Left 10/24/2013   Procedure: IRRIGATION AND DEBRIDEMENT OF LEFT LEG WITH PLACEMENT OF ACELL AND WOUND [place];  Surgeon: Theodoro Kos, DO;  Location: [place];  Service: Plastics;  Laterality: Left;  . [place]  . MASTECTOMY Bilateral rigth 1989///   left  1990   breast cancer 1989//   fibrocytic disease 1990  . TOTAL ABDOMINAL HYSTERECTOMY W/ BILATERAL SALPINGOOPHORECTOMY  1989   done at same time as right mastectomy  . TOTAL KNEE ARTHROPLASTY Right 10/05/2012   Procedure: RIGHT TOTAL KNEE ARTHROPLASTY;  Surgeon: Mauri Pole, MD;  [facility];  Service: Orthopedics;  Laterality: Right;    Allergies:  is allergic to eggs or egg-derived products; influenza vaccines; nucala [mepolizumab]; cephalosporins; codeine; levofloxacin; and pneumococcal vaccines.   Medications: Prior to Admission medications   Medication Sig Start Date End Date Taking? Authorizing Provider  acetaminophen (TYLENOL) 500 MG tablet Take 500 mg by mouth every 6 (six) hours as needed for mild pain or moderate pain.   Yes [provider]  albuterol (PROAIR HFA) 108 (90 Base) MCG/ACT inhaler Inhale 2 puffs into the lungs every 6 (six) hours as needed for wheezing or shortness of breath. 07/03/16  Yes Javier Glazier, MD  albuterol (PROVENTIL) (2.5 MG/3ML) 0.083% nebulizer solution Take 3 mLs (2.5 mg total) by nebulization every 6 (six) hours as needed for wheezing or shortness of breath. Dx: J45.41 11/14/16  Yes Javier Glazier, MD  benzonatate (TESSALON) 100 MG capsule TAKE 1 CAPSULE (100 MG TOTAL) BY MOUTH 3 (THREE) TIMES DAILY AS NEEDED FOR COUGH. 08/26/16  Yes Javier Glazier, MD  budesonide-formoterol [facility]) 160-4.5 MCG/ACT inhaler INHALE 2 PUFFS INTO THE LUNGS TWICE A DAY 09/01/16  Yes Javier Glazier, MD  Calcium Carbonate-Vitamin D (CALTRATE 600+D) 600-400 MG-UNIT per tablet Take 1 tablet by mouth daily.    Yes [provider]  chlorpheniramine (CHLOR-TRIMETON) 4 MG tablet Take by mouth. 4 mg in the morning and 8 mg at bedtime 01/12/13  Yes Elsie Stain, MD  Cholecalciferol (VITAMIN D3) 1000 UNITS tablet Take 1,000 Units by mouth daily.     Yes [provider]  dextromethorphan (DELSYM) 30 MG/5ML liquid Take 60 mg by mouth 2 (two) times daily as needed for cough.   Yes [provider]  esomeprazole (NEXIUM) 40 MG capsule Take 1 capsule (40 mg total) by mouth 2 (two) times daily. Name brand only Patient taking differently: Take 40 mg by mouth every evening. Name brand only 10/16/16  Yes  Nestor, Sonia Baller, MD  fexofenadine (ALLEGRA) 180 MG tablet Take 180 mg by mouth daily.   12/02/10  Yes Elsie Stain, MD  furosemide (LASIX) 40 MG tablet TAKE ONE-HALF (1/2) TABLET EVERY MORNING 04/14/16  Yes Nahser, Wonda Cheng, MD  mometasone (NASONEX) 50 MCG/ACT nasal spray USE 2 SPRAYS NASALLY once daily 06/25/16  Yes Javier Glazier, MD  montelukast (SINGULAIR) 10 MG tablet Take 1 tablet (10 mg total) by mouth at bedtime. 07/03/16  Yes Javier Glazier, MD  Multiple Vitamin (MULTIVITAMIN) tablet Take 1 tablet by mouth daily.   Yes [provider]  potassium chloride (K-DUR) 10 MEQ tablet TAKE 1 TABLET DAILY 04/14/16  Yes Nahser, Wonda Cheng, MD  pramipexole (MIRAPEX) 0.125 MG tablet Take 0.375 mg by mouth at bedtime.    Yes [provider]  rosuvastatin (CRESTOR) 5 MG tablet Take 1 tablet (5 mg total) by mouth at bedtime.  02/21/16  Yes Nahser, Wonda Cheng, MD  Spacer/Aero-Holding Chambers (AEROCHAMBER MV) inhaler by Other route. Use as instructed    Yes [provider]  telmisartan (MICARDIS) 20 MG tablet TAKE 1 TABLET AT BEDTIME 04/14/16  Yes Nahser, Wonda Cheng, MD  Tiotropium Bromide Monohydrate (SPIRIVA RESPIMAT) 2.5 MCG/ACT AERS Inhale 2 puffs into the lungs daily. 09/01/16  Yes Javier Glazier, MD  vitamin B-12 (CYANOCOBALAMIN) 500 MCG tablet Take 500 mcg by mouth daily.     Yes [provider]  XARELTO 20 MG TABS tablet Take 20 mg by mouth daily.    Yes [provider]  predniSONE (DELTASONE) 10 MG tablet 4 tabs for 2 days, then 3 tabs for 2 days, 2 tabs for 2 days, then 1 tab for 2 days, then stop Patient not taking: Reported on 12/19/2016 11/13/16   Parrett, Fonnie Mu, NP    Family History: Family Status  Relation Status  . Mother Deceased at age 71       asthma and MI  . Father Deceased at age 70       CHF -- MI   . Brother Deceased       cancer  . Brother Deceased       lung problems  . Brother Alive  . Sister Deceased       CAD  .  Sister Alive  . Brother (Not Specified)  . Brother (Not Specified)  . Sister (Not Specified)    Social History:   reports that she has never smoked. She has never used smokeless tobacco. She reports that she does not drink alcohol or use drugs.   Social History   Social History Narrative   Originally from [place]. Always lived in [place]. Has traveled across the country to [place]. 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 No known asbestos exposure (brother with mesothelioma). No pets currently. No bird exposure. No mold exposure. Carpet has been removed from her home.     Review of Systems: She has some intermittent arthritis.  Currently feeling bad with her hip.  She has significant Asthma with intermittent exacerbations as well as perennial allergies.  His urinary frequency.  Other than as noted above the remainder of the review of systems is unremarkable.  Physical Exam: BP (!) 115/56 (BP Location: Right Arm)   Pulse 79   Temp 97.6 F (36.4 C) (Oral)   Resp 18   Ht 5\' 3"  (1.6 m)   Wt 68.9 kg (152 lb)   SpO2 94%   BMI 26.93 kg/m   General appearance: pleasant elderly female in no acute distress Head: Normocephalic, without obvious abnormality, atraumatic Eyes: conjunctivae/corneas clear. PERRL, EOM's intact. Fundi Not examined Neck: no adenopathy, no carotid bruit, no JVD and supple, symmetrical, trachea midline Lungs: clear to auscultation bilaterally Heart: regular rate and rhythm, S1, S2 normal, no murmur, click, rub or gallop Abdomen: soft, non-tender; bowel sounds normal; no masses,  no organomegaly Extremities: right leg externally rotated, previous healed scar of knee surgery on the right, mild venous insufficiency, no edema Pulses: pedal pulses are 1+ Skin: Skin color, texture, turgor normal. No rashes or lesions Neurologic: Grossly normal Psych: Alert and oriented x 3 Labs: CBC  Recent Labs  12/19/16 1650 12/20/16 0600  WBC 8.7  7.9  RBC 4.11 3.98  HGB 12.3 11.8*  HCT 37.5 36.0  PLT 284 219  MCV 91.2 90.5  MCH 29.9 29.6  MCHC 32.8 32.8  RDW 13.0 13.1  LYMPHSABS 1.1  --   MONOABS 0.7  --   EOSABS 0.3  --   BASOSABS 0.0  --    CMP   Recent Labs  12/20/16 0600  NA 136  K 3.7  CL 99*  CO2 27  GLUCOSE 115*  BUN 15  CREATININE 1.24*  CALCIUM 8.8*  GFRNONAA 40*  GFRAA 46*   Radiology:  No edema or consolidation  EKG: Sinus rhythm, left bundle branch block Independently reviewed by me  Signed:  W. Doristine Church MD [facility]   Cardiology Consultant  12/20/2016, 9:54 AM

## 2016-12-21 MED ORDER — ENSURE ENLIVE PO LIQD
237.0000 mL | Freq: Two times a day (BID) | ORAL | Status: DC
  Administered 2016-12-23 – 2016-12-25 (×5): 237 mL via ORAL

## 2016-12-21 MED ORDER — HYDROMORPHONE HCL-NACL 0.5-0.9 MG/ML-% IV SOSY
0.5000 mg | PREFILLED_SYRINGE | INTRAVENOUS | Status: DC | PRN
  Administered 2016-12-21 – 2016-12-22 (×2): 0.5 mg via INTRAVENOUS
  Filled 2016-12-21 (×2): qty 1

## 2016-12-21 MED ORDER — ALUM & MAG HYDROXIDE-SIMETH 200-200-20 MG/5ML PO SUSP
15.0000 mL | ORAL | Status: DC | PRN
  Administered 2016-12-21: 15 mL via ORAL
  Filled 2016-12-21: qty 30

## 2016-12-21 NOTE — Progress Notes (Signed)
[facility] PROGRESS NOTE  [name] TJQ:[id number] DOB: 1932-09-18 DOA: 12/19/2016  PCP: Crist Infante, MD  Brief History/Interval Summary: 83 year old Caucasian female with a past medical history of asthma, chronic diastolic CHF, hypertension, remote history of breast cancer, history of DVT for which she continues to be on Xarelto presented after a mechanical fall resulted in left hip fracture. Patient was hospitalized for further management.  Reason for Visit: Left hip fracture  Consultants: Orthopedics. Cardiology  Procedures:  Transthoracic echocardiogram Study Conclusions  - Left ventricle: The cavity size was normal. Wall thickness was   increased increased in a pattern of mild to moderate LVH.   Systolic function was normal. The estimated ejection fraction was   in the range of 55% to 60%. Wall motion was normal; there were no   regional wall motion abnormalities. Doppler parameters are   consistent with abnormal left ventricular relaxation (grade 1   diastolic dysfunction). - Ventricular septum: Septal motion showed mild dyssynergy. These   changes are consistent with a left bundle branch block. - Mitral valve: Calcified annulus.   Antibiotics: None  Subjective/Interval History: Patient feels better this morning. She did have an episode of vomiting last night she ate her supper. Denies any abdominal pain currently. Tolerated her breakfast.   ROS: No headaches  Objective:  Vital Signs  Vitals:   12/20/16 2003 12/21/16 0025 12/21/16 0533 12/21/16 0909  BP: 127/63 (!) 88/44 (!) 102/49   Pulse: 95 91 86   Resp: 16 15 16    Temp: 98.9 F (37.2 C) 98.8 F (37.1 C) 98 F (36.7 C)   TempSrc: Oral Oral Oral   SpO2: 92% 96% 97% 97%  Weight:      Height:        Intake/Output Summary (Last 24 hours) at 12/21/16 0951 Last data filed at 12/21/16 0536  Gross per 24 hour  Intake              360 ml  Output              375 ml  Net              -15 ml    Filed Weights   12/19/16 1604  Weight: 68.9 kg (152 lb)    General appearance: Awake, alert. In no distress Resp: Clear to auscultation bilaterally Cardio: S1, S2 is normal, regular. No S3, S4. No rubs. Systolic murmur appreciated over the precordium GI: Abdomen is soft. Nontender, nondistended. Bowel sounds are present. No masses or organomegaly. Extremities: Left lower extremity is externally rotated. Pitting pedal edema noted bilateral lower extremities. Neurologic: No focal deficits  Lab Results:  Data Reviewed: I have personally reviewed following labs and imaging studies  CBC:  Recent Labs Lab 12/19/16 1650 12/20/16 0600  WBC 8.7 7.9  NEUTROABS 6.7  --   HGB 12.3 11.8*  HCT 37.5 36.0  MCV 91.2 90.5  PLT 284 762    Basic Metabolic Panel:  Recent Labs Lab 12/19/16 1650 12/20/16 0600  NA 138 136  K 3.8 3.7  CL 101 99*  CO2 28 27  GLUCOSE 114* 115*  BUN 16 15  CREATININE 1.23* 1.24*  CALCIUM 9.3 8.8*    GFR: Estimated Creatinine Clearance: 33.1 mL/min (A) (by C-G formula based on SCr of 1.24 mg/dL (H)).  Coagulation Profile:  Recent Labs Lab 12/19/16 1650  INR 1.98     Radiology Studies: Dg Chest 1 View  Result Date: 12/19/2016 CLINICAL DATA:  Pain following fall  EXAM: CHEST 1 VIEW COMPARISON:  August 17, 2014 FINDINGS: There is no edema or consolidation. The heart size and pulmonary vascularity are normal. No adenopathy. No pneumothorax. No fracture evident. IMPRESSION: No edema or consolidation. Electronically Signed   By: Lowella Grip III M.D.   On: 12/19/2016 16:38   Dg Hip Unilat With Pelvis 2-3 Views Left  Result Date: 12/19/2016 CLINICAL DATA:  Pain following fall EXAM: DG HIP (WITH OR WITHOUT PELVIS) 2-3V LEFT COMPARISON:  None. FINDINGS: Frontal pelvis as well as frontal and lateral left hip images were obtained. There is an impacted fracture in the subcapital femoral neck region on the left. No other fracture. No dislocation. There  is slight symmetric narrowing of both hip joints. There is mild osteoarthritic change in each sacroiliac joint. IMPRESSION: Impacted left subcapital femoral neck fracture. No other fracture. No dislocation. Mild narrowing of both hip joints as well as mild osteoarthritic change in each sacroiliac joint. Electronically Signed   By: Lowella Grip III M.D.   On: 12/19/2016 16:37     Medications:  Scheduled: . loratadine  10 mg Oral Daily  . mometasone-formoterol  2 puff Inhalation BID  . montelukast  10 mg Oral QHS  . multivitamin with minerals  1 tablet Oral Daily  . pantoprazole  40 mg Oral BID  . pramipexole  0.375 mg Oral QHS  . rosuvastatin  5 mg Oral QHS  . senna  1 tablet Oral BID  . tiotropium  18 mcg Inhalation Daily   Continuous: . methocarbamol (ROBAXIN)  IV     QQV:ZDGLOVFIE, benzonatate, HYDROcodone-acetaminophen, methocarbamol **OR** methocarbamol (ROBAXIN)  IV, morphine injection, ondansetron (ZOFRAN) IV, polyethylene glycol  Assessment/Plan:  Principal Problem:   Closed left hip fracture (HCC) Active Problems:   Hypertensive heart disease without CHF   Severe persistent asthma   History of DVT of lower extremity   CKD (chronic kidney disease) stage 3, GFR 30-59 ml/min   CAD (coronary artery disease), native coronary artery    Left femoral neck fracture This was a result of a mechanical fall. Orthopedics is following. Waiting for Xarelto to washout. Surgery is tentatively planned for Monday afternoon. Pain control. She did have an episode of vomiting last night, which could've been due to narcotics. He is better this morning. Continue to monitor.  Preoperative evaluation See H&P for details. Due to new EKG changes and new lower extremity edema an echocardiogram was ordered. Please see report above. Systolic function is normal. No wall motion abnormalities. Cardiology was also consulted and appreciate their input. No need for any further testing at this time prior  to surgery.  History of severe persistent asthma Stable currently. Continue her medications. Will need incentive spirometry postoperatively. Will need close monitoring of pulmonary status.  History of chronic diastolic CHF/pedal edema Echocardiogram does not show any worsening heart failure. Grade 1 diastolic dysfunction noted. Reason for her peripheral edema is not entirely clear. She was given 1 dose of IV Lasix on the day of admission. Lower extremity Doppler studies negative for DVT.  History of lower extremity DVT. Last episode of DVT was 2 years ago. Continues to be an anticoagulation with Xarelto. This has been held for surgery. Will likely washout by tomorrow. Surgery is tentatively planned for Monday. Currently no need for bridging therapy with heparin. No DVT detected on lower extremity Doppler studies.  Chronic any disease stage III. Seems to be at baseline. Continue to monitor.  History of essential hypertension. Blood pressure borderline low. Not noted  to be on any antihypertensives. Could be due to pain medications. Continue to monitor for now. She is asymptomatic.  DVT Prophylaxis: SCDs    Code Status: Full code  Family Communication: Discussed with patient  Disposition Plan: Anticipate surgery tomorrow.    LOS: 2 days   [facility] Pager [phone] 12/21/2016, 9:51 AM  If 7PM-7AM, please contact night-coverage at [email or web address], password [facility]

## 2016-12-21 NOTE — Progress Notes (Signed)
Acknowledged CSW consult for SNF placement. Reviewed chart, pt to receive surgery tomorrow.  Will follow for recommendations following surgery.  Sharren Bridge, MSW, LCSW Clinical Social Work 12/21/2016 [phone]

## 2016-12-21 NOTE — Progress Notes (Addendum)
Subjective:   Procedure(s) (LRB): ARTHROPLASTY BIPOLAR HIP (HEMIARTHROPLASTY) LEFT (Left) Patient reports pain as 6 on 0-10 scale.Seen and cleared by cardiology. Dr. Alvan Dame will operate on her tomorrow.   Objective: Vital signs in last 24 hours: Temp:  [97.3 F (36.3 C)-98.9 F (37.2 C)] 98 F (36.7 C) (08/12 0533) Pulse Rate:  [75-95] 86 (08/12 0533) Resp:  [15-20] 16 (08/12 0533) BP: (88-130)/(44-93) 102/49 (08/12 0533) SpO2:  [83 %-99 %] 97 % (08/12 0533)  Intake/Output from previous day: 08/11 0701 - 08/12 0700 In: 360 [P.O.:360] Out: 675 [Urine:675] Intake/Output this shift: No intake/output data recorded.   Recent Labs  12/19/16 1650 12/20/16 0600  HGB 12.3 11.8*    Recent Labs  12/19/16 1650 12/20/16 0600  WBC 8.7 7.9  RBC 4.11 3.98  HCT 37.5 36.0  PLT 284 219    Recent Labs  12/19/16 1650 12/20/16 0600  NA 138 136  K 3.8 3.7  CL 101 99*  CO2 28 27  BUN 16 15  CREATININE 1.23* 1.24*  GLUCOSE 114* 115*  CALCIUM 9.3 8.8*    Recent Labs  12/19/16 1650  INR 1.98    Dorsiflexion/Plantar flexion intact Compartment soft  Assessment/Plan:   Procedure(s) (LRB): ARTHROPLASTY BIPOLAR HIP (HEMIARTHROPLASTY) LEFT (Left)   [name] A 12/21/2016, 8:27 AM

## 2016-12-21 NOTE — Progress Notes (Signed)
Initial Nutrition Assessment  INTERVENTION:   Provide Ensure Enlive po BID, each supplement provides 350 kcal and 20 grams of protein RD will continue to monitor  NUTRITION DIAGNOSIS:   Increased nutrient needs related to  (hip fracture) as evidenced by estimated needs.  GOAL:   Patient will meet greater than or equal to 90% of their needs  MONITOR:   PO intake, Supplement acceptance, Labs, Weight trends, I & O's  REASON FOR ASSESSMENT:   Consult Hip fracture protocol  ASSESSMENT:   83 year old Caucasian female with a past medical history of asthma, chronic diastolic CHF, hypertension, remote history of breast cancer, history of DVT for which she continues to be on Xarelto presented after a mechanical fall resulted in left hip fracture. Patient was hospitalized for further management.  Patient pending surgery for hip fracture. Expected to be tomorrow. Pt consumed 100% of lunch yesterday but then developed vomiting after dinner. Pt did tolerate breakfast of toast, fruit and sausage this morning. Will add Ensure for added nutrition before surgery and afterwards. Weight is stable. No muscle or fat depletion noted.  Labs reviewed. Medications: Multivitamin with minerals daily, Protonix tablet BID, Senokot tablet BID  Diet Order:  Diet Heart Room service appropriate? Yes; Fluid consistency: Thin Diet NPO time specified  Skin:  Reviewed, no issues  Last BM:  8/10  Height:   Ht Readings from Last 1 Encounters:  12/19/16 5\' 3"  (1.6 m)    Weight:   Wt Readings from Last 1 Encounters:  12/19/16 152 lb (68.9 kg)    Ideal Body Weight:  52.3 kg  BMI:  Body mass index is 26.93 kg/m.  Estimated Nutritional Needs:   Kcal:  1500-1700  Protein:  70-80g  Fluid:  1.7L/day  EDUCATION NEEDS:   No education needs identified at this time  Clayton Bibles, MS, RD, LDN Pager: [phone] After Hours Pager: [phone]

## 2016-12-21 NOTE — Progress Notes (Signed)
Subjective:  Has nausea after pain meds yesterday.  Feels OK today.  No chest pain or SOB.   Objective:  Vital Signs in the last 24 hours: BP (!) 102/49 (BP Location: Right Wrist)   Pulse 86   Temp 98 F (36.7 C) (Oral)   Resp 16   Ht 5\' 3"  (1.6 m)   Wt 68.9 kg (152 lb)   SpO2 97%   BMI 26.93 kg/m   Physical Exam: Pleasant WF in NAD Lungs:  Clear  Cardiac:  Regular rhythm, normal S1 and S2, no S3 Extremities:  No edema present  Intake/Output from previous day: 08/11 0701 - 08/12 0700 In: 360 [P.O.:360] Out: 675 [Urine:675] Weight Filed Weights   12/19/16 1604  Weight: 68.9 kg (152 lb)    Lab Results: Basic Metabolic Panel:  Recent Labs  12/19/16 1650 12/20/16 0600  NA 138 136  K 3.8 3.7  CL 101 99*  CO2 28 27  GLUCOSE 114* 115*  BUN 16 15  CREATININE 1.23* 1.24*    CBC:  Recent Labs  12/19/16 1650 12/20/16 0600  WBC 8.7 7.9  NEUTROABS 6.7  --   HGB 12.3 11.8*  HCT 37.5 36.0  MCV 91.2 90.5  PLT 284 219   Telemetry: Sinus rhythm  Assessment/Plan:  Stable for surgery.  Cal if needed     W. Doristine Church  MD [facility]  12/21/2016, 8:24 AM

## 2016-12-22 ENCOUNTER — Inpatient Hospital Stay (HOSPITAL_COMMUNITY): Payer: Medicare Other

## 2016-12-22 ENCOUNTER — Inpatient Hospital Stay (HOSPITAL_COMMUNITY): Payer: Medicare Other | Admitting: Anesthesiology

## 2016-12-22 ENCOUNTER — Encounter (HOSPITAL_COMMUNITY): Payer: Self-pay | Admitting: Orthopedic Surgery

## 2016-12-22 ENCOUNTER — Encounter (HOSPITAL_COMMUNITY)
Admission: EM | Disposition: A | Discharge status: Skilled Nursing Facility | Payer: Self-pay | Source: Home / Self Care | Attending: Internal Medicine

## 2016-12-22 DIAGNOSIS — R112 Nausea with vomiting, unspecified: Secondary | ICD-10-CM

## 2016-12-22 HISTORY — PX: HIP ARTHROPLASTY: SHX981

## 2016-12-22 LAB — BASIC METABOLIC PANEL
Anion gap: 8 (ref 5–15)
BUN: 19 mg/dL (ref 6–20)
CHLORIDE: 95 mmol/L — AB (ref 101–111)
CO2: 27 mmol/L (ref 22–32)
CREATININE: 1.28 mg/dL — AB (ref 0.44–1.00)
Calcium: 8.5 mg/dL — ABNORMAL LOW (ref 8.9–10.3)
GFR calc non Af Amer: 38 mL/min — ABNORMAL LOW (ref >60)
GFR, EST AFRICAN AMERICAN: 44 mL/min — AB (ref >60)
Glucose, Bld: 110 mg/dL — ABNORMAL HIGH (ref 65–99)
POTASSIUM: 4.3 mmol/L (ref 3.5–5.1)
Sodium: 130 mmol/L — ABNORMAL LOW (ref 135–145)

## 2016-12-22 LAB — SURGICAL PCR SCREEN
MRSA, PCR: NEGATIVE
STAPHYLOCOCCUS AUREUS: NEGATIVE

## 2016-12-22 LAB — PROTIME-INR
INR: 1.18
Prothrombin Time: 15.1 seconds (ref 11.4–15.2)

## 2016-12-22 SURGERY — HEMIARTHROPLASTY, HIP, DIRECT ANTERIOR APPROACH, FOR FRACTURE
Anesthesia: Spinal | Site: Hip | Laterality: Left

## 2016-12-22 MED ORDER — METHOCARBAMOL 500 MG PO TABS
500.0000 mg | ORAL_TABLET | Freq: Four times a day (QID) | ORAL | Status: DC | PRN
  Administered 2016-12-23 – 2016-12-24 (×4): 500 mg via ORAL
  Filled 2016-12-22 (×3): qty 1

## 2016-12-22 MED ORDER — PHENYLEPHRINE 40 MCG/ML (10ML) SYRINGE FOR IV PUSH (FOR BLOOD PRESSURE SUPPORT)
PREFILLED_SYRINGE | INTRAVENOUS | Status: AC
  Filled 2016-12-22: qty 10

## 2016-12-22 MED ORDER — ALUM & MAG HYDROXIDE-SIMETH 200-200-20 MG/5ML PO SUSP: 30.0000 mL | ORAL | Status: DC | PRN

## 2016-12-22 MED ORDER — PHENOL 1.4 % MT LIQD
1.0000 | OROMUCOSAL | Status: DC | PRN
  Filled 2016-12-22: qty 177

## 2016-12-22 MED ORDER — POLYETHYLENE GLYCOL 3350 17 G PO PACK
17.0000 g | PACK | Freq: Every day | ORAL | Status: DC | PRN
Start: 2016-12-22 — End: 2016-12-24

## 2016-12-22 MED ORDER — MENTHOL 3 MG MT LOZG
1.0000 | LOZENGE | OROMUCOSAL | Status: DC | PRN
  Filled 2016-12-22: qty 9

## 2016-12-22 MED ORDER — ONDANSETRON HCL 4 MG PO TABS
4.0000 mg | ORAL_TABLET | Freq: Four times a day (QID) | ORAL | Status: DC | PRN
  Administered 2016-12-26 (×2): 4 mg via ORAL
  Filled 2016-12-22 (×2): qty 1

## 2016-12-22 MED ORDER — PROPOFOL 500 MG/50ML IV EMUL
INTRAVENOUS | Status: DC | PRN
  Administered 2016-12-22: 50 ug/kg/min via INTRAVENOUS

## 2016-12-22 MED ORDER — EPHEDRINE 5 MG/ML INJ
INTRAVENOUS | Status: AC
  Filled 2016-12-22: qty 10

## 2016-12-22 MED ORDER — CEFAZOLIN SODIUM-DEXTROSE 2-4 GM/100ML-% IV SOLN
INTRAVENOUS | Status: AC
  Filled 2016-12-22: qty 100

## 2016-12-22 MED ORDER — FERROUS SULFATE 325 (65 FE) MG PO TABS
325.0000 mg | ORAL_TABLET | Freq: Two times a day (BID) | ORAL | Status: DC
  Administered 2016-12-23 – 2016-12-26 (×8): 325 mg via ORAL
  Filled 2016-12-22 (×10): qty 1

## 2016-12-22 MED ORDER — PHENYLEPHRINE HCL 10 MG/ML IJ SOLN
INTRAMUSCULAR | Status: AC
  Filled 2016-12-22: qty 2

## 2016-12-22 MED ORDER — PROPOFOL 10 MG/ML IV BOLUS
INTRAVENOUS | Status: AC
  Filled 2016-12-22: qty 60

## 2016-12-22 MED ORDER — METOCLOPRAMIDE HCL 5 MG PO TABS
5.0000 mg | ORAL_TABLET | Freq: Three times a day (TID) | ORAL | Status: DC | PRN
  Administered 2016-12-24: 10 mg via ORAL
  Filled 2016-12-22: qty 2

## 2016-12-22 MED ORDER — HYDROMORPHONE HCL-NACL 0.5-0.9 MG/ML-% IV SOSY
0.5000 mg | PREFILLED_SYRINGE | INTRAVENOUS | Status: DC | PRN
  Administered 2016-12-22: 1 mg via INTRAVENOUS
  Administered 2016-12-22: 0.5 mg via INTRAVENOUS
  Administered 2016-12-23 (×3): 1 mg via INTRAVENOUS
  Filled 2016-12-22 (×5): qty 2

## 2016-12-22 MED ORDER — SODIUM CHLORIDE 0.9 % IV SOLN
INTRAVENOUS | Status: AC
  Administered 2016-12-22: 17:00:00 via INTRAVENOUS

## 2016-12-22 MED ORDER — HYDROCODONE-ACETAMINOPHEN 5-325 MG PO TABS
1.0000 | ORAL_TABLET | Freq: Four times a day (QID) | ORAL | Status: DC | PRN
  Administered 2016-12-23 – 2016-12-25 (×7): 2 via ORAL
  Filled 2016-12-22 (×8): qty 2
  Filled 2016-12-22: qty 1

## 2016-12-22 MED ORDER — LIDOCAINE 2% (20 MG/ML) 5 ML SYRINGE
INTRAMUSCULAR | Status: AC
  Filled 2016-12-22: qty 5

## 2016-12-22 MED ORDER — ACETAMINOPHEN 325 MG PO TABS: 650.0000 mg | ORAL_TABLET | Freq: Four times a day (QID) | ORAL | Status: DC | PRN

## 2016-12-22 MED ORDER — ONDANSETRON HCL 4 MG/2ML IJ SOLN
4.0000 mg | Freq: Four times a day (QID) | INTRAMUSCULAR | Status: DC | PRN
  Administered 2016-12-25 (×2): 4 mg via INTRAVENOUS
  Filled 2016-12-22 (×2): qty 2

## 2016-12-22 MED ORDER — CEFAZOLIN SODIUM-DEXTROSE 1-4 GM/50ML-% IV SOLN
1.0000 g | Freq: Four times a day (QID) | INTRAVENOUS | Status: AC
  Administered 2016-12-22 – 2016-12-23 (×2): 1 g via INTRAVENOUS
  Filled 2016-12-22 (×3): qty 50

## 2016-12-22 MED ORDER — BUPIVACAINE HCL (PF) 0.5 % IJ SOLN
INTRAMUSCULAR | Status: DC | PRN
  Administered 2016-12-22: 1 mL via INTRATHECAL

## 2016-12-22 MED ORDER — SODIUM CHLORIDE 0.9 % IV SOLN
INTRAVENOUS | Status: DC
  Administered 2016-12-22: 10:00:00 via INTRAVENOUS

## 2016-12-22 MED ORDER — FENTANYL CITRATE (PF) 100 MCG/2ML IJ SOLN
INTRAMUSCULAR | Status: DC | PRN
  Administered 2016-12-22: 50 ug via INTRAVENOUS

## 2016-12-22 MED ORDER — BUPIVACAINE HCL (PF) 0.5 % IJ SOLN
INTRAMUSCULAR | Status: AC
  Filled 2016-12-22: qty 30

## 2016-12-22 MED ORDER — 0.9 % SODIUM CHLORIDE (POUR BTL) OPTIME
TOPICAL | Status: DC | PRN
  Administered 2016-12-22: 1000 mL

## 2016-12-22 MED ORDER — SODIUM CHLORIDE 0.9 % IV SOLN
INTRAVENOUS | Status: DC | PRN
  Administered 2016-12-22: 50 ug/min via INTRAVENOUS

## 2016-12-22 MED ORDER — DOCUSATE SODIUM 100 MG PO CAPS
100.0000 mg | ORAL_CAPSULE | Freq: Two times a day (BID) | ORAL | Status: DC
  Administered 2016-12-22 – 2016-12-26 (×7): 100 mg via ORAL
  Filled 2016-12-22 (×8): qty 1

## 2016-12-22 MED ORDER — METOCLOPRAMIDE HCL 5 MG/ML IJ SOLN
5.0000 mg | Freq: Three times a day (TID) | INTRAMUSCULAR | Status: DC | PRN
  Administered 2016-12-23 – 2016-12-24 (×2): 10 mg via INTRAVENOUS
  Filled 2016-12-22 (×2): qty 2

## 2016-12-22 MED ORDER — DEXTROSE 5 % IV SOLN
500.0000 mg | Freq: Four times a day (QID) | INTRAVENOUS | Status: DC | PRN
  Filled 2016-12-22: qty 5

## 2016-12-22 MED ORDER — ACETAMINOPHEN 650 MG RE SUPP
650.0000 mg | Freq: Four times a day (QID) | RECTAL | Status: DC | PRN
Start: 2016-12-22 — End: 2016-12-26

## 2016-12-22 MED ORDER — FENTANYL CITRATE (PF) 100 MCG/2ML IJ SOLN
INTRAMUSCULAR | Status: AC
  Filled 2016-12-22: qty 2

## 2016-12-22 MED ORDER — PHENYLEPHRINE HCL 10 MG/ML IJ SOLN
INTRAMUSCULAR | Status: DC | PRN
  Administered 2016-12-22 (×2): 200 ug via INTRAVENOUS

## 2016-12-22 MED ORDER — MIDAZOLAM HCL 2 MG/2ML IJ SOLN
INTRAMUSCULAR | Status: AC
  Filled 2016-12-22: qty 2

## 2016-12-22 MED ORDER — SODIUM CHLORIDE 0.9 % IV SOLN
INTRAVENOUS | Status: DC | PRN
  Administered 2016-12-22: 13:00:00 via INTRAVENOUS

## 2016-12-22 MED ORDER — MIDAZOLAM HCL 5 MG/5ML IJ SOLN
INTRAMUSCULAR | Status: DC | PRN
  Administered 2016-12-22: 2 mg via INTRAVENOUS

## 2016-12-22 MED ORDER — CEFAZOLIN SODIUM-DEXTROSE 2-4 GM/100ML-% IV SOLN
2.0000 g | Freq: Once | INTRAVENOUS | Status: AC
  Administered 2016-12-22: 2 g via INTRAVENOUS

## 2016-12-22 MED ORDER — LACTATED RINGERS IV SOLN
INTRAVENOUS | Status: DC | PRN
  Administered 2016-12-22: 14:00:00 via INTRAVENOUS

## 2016-12-22 MED ORDER — RIVAROXABAN 20 MG PO TABS
20.0000 mg | ORAL_TABLET | Freq: Every day | ORAL | Status: DC
  Administered 2016-12-23 – 2016-12-26 (×4): 20 mg via ORAL
  Filled 2016-12-22 (×4): qty 1

## 2016-12-22 MED ORDER — PROPOFOL 500 MG/50ML IV EMUL
INTRAVENOUS | Status: DC | PRN
  Administered 2016-12-22 (×2): 50 mg via INTRAVENOUS

## 2016-12-22 MED ORDER — EPHEDRINE SULFATE 50 MG/ML IJ SOLN
INTRAMUSCULAR | Status: DC | PRN
  Administered 2016-12-22 (×3): 10 mg via INTRAVENOUS

## 2016-12-22 MED ORDER — FENTANYL CITRATE (PF) 100 MCG/2ML IJ SOLN
25.0000 ug | INTRAMUSCULAR | Status: DC | PRN
  Administered 2016-12-22: 25 ug via INTRAVENOUS

## 2016-12-22 SURGICAL SUPPLY — 50 items
BAG ZIPLOCK 12X15 (MISCELLANEOUS) ×3 IMPLANT
BLADE SAW SGTL 11.0X1.19X90.0M (BLADE) IMPLANT
BLADE SAW SGTL 18X1.27X75 (BLADE) ×2 IMPLANT
BLADE SAW SGTL 18X1.27X75MM (BLADE) ×1
CAPT HIP HEMI 2 ×3 IMPLANT
CLOSURE WOUND 1/2 X4 (GAUZE/BANDAGES/DRESSINGS)
COVER SURGICAL LIGHT HANDLE (MISCELLANEOUS) ×3 IMPLANT
DERMABOND ADVANCED (GAUZE/BANDAGES/DRESSINGS) ×2
DERMABOND ADVANCED .7 DNX12 (GAUZE/BANDAGES/DRESSINGS) ×1 IMPLANT
DRAPE ORTHO SPLIT 77X108 STRL (DRAPES) ×4
DRAPE POUCH INSTRU U-SHP 10X18 (DRAPES) ×3 IMPLANT
DRAPE SURG 17X11 SM STRL (DRAPES) ×3 IMPLANT
DRAPE SURG ORHT 6 SPLT 77X108 (DRAPES) ×2 IMPLANT
DRAPE U-SHAPE 47X51 STRL (DRAPES) ×3 IMPLANT
DRSG AQUACEL AG ADV 3.5X10 (GAUZE/BANDAGES/DRESSINGS) ×3 IMPLANT
DRSG TEGADERM 4X4.75 (GAUZE/BANDAGES/DRESSINGS) IMPLANT
DURAPREP 26ML APPLICATOR (WOUND CARE) ×6 IMPLANT
ELECT BLADE TIP CTD 4 INCH (ELECTRODE) ×3 IMPLANT
ELECT REM PT RETURN 15FT ADLT (MISCELLANEOUS) ×3 IMPLANT
EVACUATOR 1/8 PVC DRAIN (DRAIN) IMPLANT
FACESHIELD WRAPAROUND (MASK) ×12 IMPLANT
GAUZE SPONGE 2X2 8PLY STRL LF (GAUZE/BANDAGES/DRESSINGS) IMPLANT
GLOVE BIOGEL M 7.0 STRL (GLOVE) IMPLANT
GLOVE BIOGEL PI IND STRL 7.5 (GLOVE) ×1 IMPLANT
GLOVE BIOGEL PI IND STRL 8.5 (GLOVE) ×1 IMPLANT
GLOVE BIOGEL PI INDICATOR 7.5 (GLOVE) ×2
GLOVE BIOGEL PI INDICATOR 8.5 (GLOVE) ×2
GLOVE ECLIPSE 8.0 STRL XLNG CF (GLOVE) IMPLANT
GLOVE ORTHO TXT STRL SZ7.5 (GLOVE) ×6 IMPLANT
GLOVE SURG ORTHO 8.0 STRL STRW (GLOVE) ×3 IMPLANT
GOWN STRL REUS W/TWL LRG LVL3 (GOWN DISPOSABLE) ×3 IMPLANT
GOWN STRL REUS W/TWL XL LVL3 (GOWN DISPOSABLE) ×6 IMPLANT
HANDPIECE INTERPULSE COAX TIP (DISPOSABLE)
IMMOBILIZER KNEE 20 (SOFTGOODS)
IMMOBILIZER KNEE 20 THIGH 36 (SOFTGOODS) IMPLANT
KIT BASIN OR (CUSTOM PROCEDURE TRAY) ×3 IMPLANT
MANIFOLD NEPTUNE II (INSTRUMENTS) ×3 IMPLANT
PACK TOTAL JOINT (CUSTOM PROCEDURE TRAY) ×3 IMPLANT
POSITIONER SURGICAL ARM (MISCELLANEOUS) ×9 IMPLANT
SET HNDPC FAN SPRY TIP SCT (DISPOSABLE) IMPLANT
SPONGE GAUZE 2X2 STER 10/PKG (GAUZE/BANDAGES/DRESSINGS)
STRIP CLOSURE SKIN 1/2X4 (GAUZE/BANDAGES/DRESSINGS) IMPLANT
SUT ETHIBOND NAB CT1 #1 30IN (SUTURE) ×3 IMPLANT
SUT MNCRL AB 4-0 PS2 18 (SUTURE) ×3 IMPLANT
SUT VIC AB 1 CT1 36 (SUTURE) ×6 IMPLANT
SUT VIC AB 2-0 CT1 27 (SUTURE) ×4
SUT VIC AB 2-0 CT1 TAPERPNT 27 (SUTURE) ×2 IMPLANT
SUT VLOC 180 0 24IN GS25 (SUTURE) ×3 IMPLANT
TOWEL OR 17X26 10 PK STRL BLUE (TOWEL DISPOSABLE) ×6 IMPLANT
TRAY FOLEY W/METER SILVER 16FR (SET/KITS/TRAYS/PACK) ×3 IMPLANT

## 2016-12-22 NOTE — Op Note (Signed)
NAMEELAN, MCELVAIN             ACCOUNT NO.:  [id number]  MEDICAL RECORD NO.:  [id number]  LOCATION:                                 FACILITY:  PHYSICIAN:  Pietro Cassis. Alvan Dame, M.D.       DATE OF BIRTH:  DATE OF PROCEDURE:  12/22/2016 DATE OF DISCHARGE:                              OPERATIVE REPORT   PREOPERATIVE DIAGNOSIS:  Displaced left femoral neck fracture.  POSTOPERATIVE DIAGNOSIS:  Displaced left femoral neck fracture.  PROCEDURE:  Left hip hemiarthroplasty.  COMPONENTS USED:  DePuy hemiarthroplasty system with a size 6 standard Tri-Lock stem with a 46 mm unipolar ball and a +0 adapter.  SURGEON:  Pietro Cassis. Alvan Dame, M.D.  ASSISTANT:  Molli Barrows PAC.  Note that Mr. Chabon was present for the entirety of the case from preoperative position, perioperative management of the operative extremity, general facilitation of the case and primary wound closure.  ANESTHESIA:  Spinal.  SPECIMENS:  None.  COMPLICATION:  None.  BLOOD LOSS:  Less than 100 mL.  INDICATION:  Ms. Gryder is a pleasant 83 year old female who I have seen in the office in the past.  She currently lives with her husband.  She was out getting the mail when she stumbled and fell coming in, landing directly on her left side.  She had immediate onset of pain in the left hip, but that she was able to sit up on some steps, was unable to bear weight on the left lower extremity and was subsequently brought to the emergency room. Radiographs revealed a left femoral neck fracture.  She was admitted to the medical service per protocol.  Orthopedics was consulted.  She was on Xarelto and thus surgical timing was left until I was back in town.  She had requested my services.  I had a chance to review with her, her exam which she was unable to move her legs, unable to bear weight, unable to tolerate any movement indicated and unable to left femoral neck fracture.  I reviewed with her options of  hemiarthroplasty. Reviewed the risk of infection, DVT, component failure, need for future surgeries.  She has a history of DVT and subsequently is on Xarelto, which will be started postop.  We discussed the need for future surgeries.  Consent was obtained for benefit of her fracture management consent.  PROCEDURE IN DETAIL:  The patient was brought to the operative theater. Once adequate anesthesia, preoperative antibiotics, Ancef 2 g was administered, she was positioned into the right lateral decubitus position with the left side up.  The left lower extremity was then prepped and draped in sterile fashion.  Time-out was performed identifying the patient, planned procedure, and extremity.  We had planned on a posterior approach.  Incision was made over the area of the greater trochanter.  Soft tissue dissection was carried down to the iliotibial band and gluteal fascia.  These were then split for posterior approach to the hip.  The posterior aspect of the hip was exposed. Capsulotomy was made preserving the posterior and superior leaflet. Fracture site identified.  The hip was then flexed and internally rotated.  A neck osteotomy was made in the trochanteric fossa.  I removed the femoral neck fragments as well as the femoral head.  The femoral head was then measured on the back table to be 46 mm.  The fragments and comminuted segments were removed from the articular surface.  At this point, attention was directed to preparation of the femur.  The femur was flexed and internally rotated 90 degrees.  I used a starting drill and the canal finer.  I then irrigated the canal to try to prevent fat emboli.  We began broaching with a starter broach and broached up to a size 5, initially did a trial reduction with a standard neck based on the radiographic appearance of offset.  With this, I felt I was a little short based on her down leg.  However, the hip was stable based on  the orientation of the components.  Given this, the trial components were removed.  I broached up to a size 6, which sat a little bit proud at the neck cut which I nadired for neck length.  We thus selected the size 6 standard Tri-Lock stem.  Following removal of trials and irrigating the hip canal as well as the acetabular surface, I placed a size 6 standard Tri-Lock component.  It sat proud like the broach head.  I did a repeat trial reduction with a 0 adapter and a 46 unipolar ball.  With this, I found that she was very stable throughout a range of motion.  No evidence of impingement or subluxation and her leg lengths appeared to be comparable to the down leg.  Once this was done, the final 0 adapter was opened with a 46 mm ball. The final ball and adapter impacted on a clean and dry trunnion and the hip was reduced.  The hip was irrigated throughout the case again at this point.  The posterior capsular leaflets were reapproximated using #1 Vicryl.  The iliotibial band and gluteal fascia were then reapproximated using #1 Vicryl and 0 Stratafix suture.  The remainder of the wound was closed with 2-0 Vicryl and running 4-0 Monocryl.  The hip was cleaned, dried, and dressed sterilely using surgical glue and Aquacel dressing.  She was brought to the recovery room in stable condition tolerating the procedure well.  I anticipate a 2 day hospital stay, most likely from here and we will try to arrange for home health at discharge.  She does live with her husband.  Discharge services and the needs will be evaluated in the hospital.     Pietro Cassis. Alvan Dame, M.D.     MDO/MEDQ  D:  12/22/2016  T:  12/22/2016  Job:  [id number]

## 2016-12-22 NOTE — Brief Op Note (Signed)
12/19/2016 - 12/22/2016  2:34 PM  PATIENT:  Leda Gauze Nicholes  83 y.o. female  PRE-OPERATIVE DIAGNOSIS:  left femoral neck fracture  POST-OPERATIVE DIAGNOSIS:  left femoral neck fracture  PROCEDURE:  Procedure(s): HEMIARTHROPLASTY, LEFT (Left)  SURGEON:  Surgeon(s) and Role:    Paralee Cancel, MD - Primary  PHYSICIAN ASSISTANT:  Molli Barrows, PA-C  ANESTHESIA:   spinal  EBL:  Total I/O In: 1000 [I.V.:1000] Out: 750 [Urine:700; Blood:50]  BLOOD ADMINISTERED:none  DRAINS: none   LOCAL MEDICATIONS USED:  NONE  SPECIMEN:  No Specimen  DISPOSITION OF SPECIMEN:  N/A  COUNTS:  YES  TOURNIQUET:  * No tourniquets in log *  DICTATION: .Other Dictation: Dictation Number [id number]  PLAN OF CARE: Admit to inpatient   PATIENT DISPOSITION:  PACU - hemodynamically stable.   Delay start of Pharmacological VTE agent (>24hrs) due to surgical blood loss or risk of bleeding: no

## 2016-12-22 NOTE — Care Management Note (Signed)
Case Management Note  Patient Details  Name: Porcha Deblanc MRN: [id number] Date of Birth: 03-15-1933  Subjective/Objective:      Fracture of the left hip and or              Action/Plan: Date:  December 22, 2016  Chart reviewed for concurrent status and case management needs.  Will continue to follow patient progress.  Discharge Planning: following for needs  Expected discharge date: [id number]  Velva Harman, BSN, [place], [place]   Expected Discharge Date:  12/24/16               Expected Discharge Plan:  Home/Self Care  In-House Referral:     Discharge planning Services  CM Consult  Post Acute Care Choice:    Choice offered to:     DME Arranged:    DME Agency:     HH Arranged:    HH Agency:     Status of Service:  In process, will continue to follow  If discussed at Long Length of Stay Meetings, dates discussed:    Additional Comments:  Leeroy Cha, RN 12/22/2016, 10:17 AM

## 2016-12-22 NOTE — Anesthesia Procedure Notes (Signed)
Spinal  Patient location during procedure: OR Start time: 12/22/2016 1:22 PM End time: 12/22/2016 1:25 PM Staffing Anesthesiologist: Lillia Abed Performed: anesthesiologist  Preanesthetic Checklist Completed: patient identified, surgical consent, pre-op evaluation, timeout performed, IV checked, risks and benefits discussed and monitors and equipment checked Spinal Block Patient position: right lateral decubitus Prep: DuraPrep Patient monitoring: heart rate, cardiac monitor, continuous pulse ox and blood pressure Approach: right paramedian Location: L3-4 Injection technique: single-shot Needle Needle type: Pencan  Needle gauge: 24 G Needle length: 9 cm Needle insertion depth: 6 cm

## 2016-12-22 NOTE — Anesthesia Preprocedure Evaluation (Addendum)
Anesthesia Evaluation  Patient identified by MRN, date of birth, ID band Patient awake    Reviewed: Allergy & Precautions, NPO status , Patient's Chart, lab work & pertinent test results  Airway Mallampati: I  TM Distance: >3 FB Neck ROM: Full    Dental   Pulmonary    Pulmonary exam normal        Cardiovascular hypertension, Pt. on medications + CAD  Normal cardiovascular exam  H/O DVT on Zarelto stopped >72 hours ago   Neuro/Psych    GI/Hepatic   Endo/Other    Renal/GU      Musculoskeletal   Abdominal   Peds  Hematology   Anesthesia Other Findings   Reproductive/Obstetrics                            Anesthesia Physical Anesthesia Plan  ASA: III  Anesthesia Plan: Spinal   Post-op Pain Management:    Induction: Intravenous  PONV Risk Score and Plan: 2 and Ondansetron and Dexamethasone  Airway Management Planned: Simple Face Mask  Additional Equipment:   Intra-op Plan:   Post-operative Plan:   Informed Consent: I have reviewed the patients History and Physical, chart, labs and discussed the procedure including the risks, benefits and alternatives for the proposed anesthesia with the patient or authorized representative who has indicated his/her understanding and acceptance.     Plan Discussed with: CRNA and Surgeon  Anesthesia Plan Comments:         Anesthesia Quick Evaluation

## 2016-12-22 NOTE — Transfer of Care (Signed)
Immediate Anesthesia Transfer of Care Note  Patient: [name]  Procedure(s) Performed: Procedure(s): HEMIARTHROPLASTY, LEFT (Left)  Patient Location: PACU  Anesthesia Type:Spinal  Level of Consciousness: awake and alert   Airway & Oxygen Therapy: Patient Spontanous Breathing and Patient connected to nasal cannula oxygen  Post-op Assessment: Report given to RN and Post -op Vital signs reviewed and stable  Post vital signs: Reviewed and stable  Last Vitals:  Vitals:   12/22/16 0412 12/22/16 0907  BP: (!) 123/42 (!) 134/53  Pulse: 87 92  Resp: 16 18  Temp: 37.3 C 37.1 C  SpO2: 93% 94%    Last Pain:  Vitals:   12/22/16 0915  TempSrc:   PainSc: 4       Patients Stated Pain Goal: 2 (72/09/47 0962)  Complications: No apparent anesthesia complications

## 2016-12-22 NOTE — Anesthesia Postprocedure Evaluation (Signed)
Anesthesia Post Note  Patient: Leda Gauze Mosely  Procedure(s) Performed: Procedure(s) (LRB): HEMIARTHROPLASTY, LEFT (Left)     Patient location during evaluation: PACU Anesthesia Type: Spinal Level of consciousness: oriented and awake and alert Pain management: pain level controlled Vital Signs Assessment: post-procedure vital signs reviewed and stable Respiratory status: spontaneous breathing, respiratory function stable and patient connected to nasal cannula oxygen Cardiovascular status: blood pressure returned to baseline and stable Postop Assessment: no headache and no backache Anesthetic complications: no    Last Vitals:  Vitals:   12/22/16 1530 12/22/16 1545  BP: (!) 106/47 (!) 108/51  Pulse: 82 79  Resp: (!) 22 14  Temp:  36.4 C  SpO2: 97% 92%    Last Pain:  Vitals:   12/22/16 1600  TempSrc:   PainSc: 5                  Dondrell Loudermilk DAVID

## 2016-12-22 NOTE — Progress Notes (Signed)
Patient ID: Melvern Banker, female   DOB: 1933-01-19, 83 y.o.   MRN: [id number]  Doing ok other than left hip pain.  Does complain of left UE pain as well.  No events since hospitalization  Pain with any movement. Unable to move left LE on her own Able to move left arm with some pain but no deformity at wrist or elbow. Some minor swelling and bruising  Mildly displaced left femoral neck fracture Left UE contusion  Plan: To OR for left hip hemiarthroplasty INR repeated as on admission it was 1.98 (repeated value normal at 1.18) Her Xarelto has been held since admission Consent ordered Ancef pre-op

## 2016-12-22 NOTE — Progress Notes (Signed)
[facility] PROGRESS NOTE  [name] BJY:[id number] DOB: [birth year] DOA: 12/19/2016  PCP: Crist Infante, MD  Brief History/Interval Summary: 83 year old Caucasian female with a past medical history of asthma, chronic diastolic CHF, hypertension, remote history of breast cancer, history of DVT for which she continues to be on Xarelto presented after a mechanical fall resulted in left hip fracture. Patient was hospitalized for further management.  Reason for Visit: Left hip fracture  Consultants: Orthopedics. Cardiology  Procedures:  Transthoracic echocardiogram Study Conclusions  - Left ventricle: The cavity size was normal. Wall thickness was   increased increased in a pattern of mild to moderate LVH.   Systolic function was normal. The estimated ejection fraction was   in the range of 55% to 60%. Wall motion was normal; there were no   regional wall motion abnormalities. Doppler parameters are   consistent with abnormal left ventricular relaxation (grade 1   diastolic dysfunction). - Ventricular septum: Septal motion showed mild dyssynergy. These   changes are consistent with a left bundle branch block. - Mitral valve: Calcified annulus.   Antibiotics: None  Subjective/Interval History: Patient feels well this morning. She had episodes of nausea and vomiting yesterday. Denies any abdominal pain. No chest pain or shortness of breath.   ROS: No headaches  Objective:  Vital Signs  Vitals:   12/21/16 2119 12/22/16 0005 12/22/16 0412 12/22/16 0907  BP: (!) 131/49 (!) 124/44 (!) 123/42 (!) 134/53  Pulse: 88 94 87 92  Resp: 16 17 16 18   Temp: 98.6 F (37 C) 98.9 F (37.2 C) 99.1 F (37.3 C) 98.8 F (37.1 C)  TempSrc: Oral Oral Oral Oral  SpO2: 97% 93% 93% 94%  Weight:      Height:        Intake/Output Summary (Last 24 hours) at 12/22/16 1313 Last data filed at 12/22/16 1252  Gross per 24 hour  Intake              240 ml  Output              950 ml    Net             -710 ml   Filed Weights   12/19/16 1604  Weight: 68.9 kg (152 lb)    General appearance: Awake, alert. In no distress. Resp: Clear to auscultation bilaterally. No wheezing, rales or rhonchi. Cardio: S1, S2 is normal, regular. No S3, S4. No rubs. Systolic murmur appreciated over the precordium. GI: Abdomen is soft. Nontender, nondistended. Bowel sounds are present. No masses or organomegaly Extremities: Left lower extremity is externally rotated. Pitting edema bilateral lower extremities is stable. Neurologic: No focal deficits  Lab Results:  Data Reviewed: I have personally reviewed following labs and imaging studies  CBC:  Recent Labs Lab 12/19/16 1650 12/20/16 0600  WBC 8.7 7.9  NEUTROABS 6.7  --   HGB 12.3 11.8*  HCT 37.5 36.0  MCV 91.2 90.5  PLT 284 086    Basic Metabolic Panel:  Recent Labs Lab 12/19/16 1650 12/20/16 0600 12/22/16 0641  NA 138 136 130*  K 3.8 3.7 4.3  CL 101 99* 95*  CO2 28 27 27   GLUCOSE 114* 115* 110*  BUN 16 15 19   CREATININE 1.23* 1.24* 1.28*  CALCIUM 9.3 8.8* 8.5*    GFR: Estimated Creatinine Clearance: 32.1 mL/min (A) (by C-G formula based on SCr of 1.28 mg/dL (H)).  Coagulation Profile:  Recent Labs Lab 12/19/16 1650 12/22/16 0840  INR 1.98  1.18     Radiology Studies: No results found.   Medications:  Scheduled: . [MAR Hold] feeding supplement (ENSURE ENLIVE)  237 mL Oral BID BM  . [MAR Hold] loratadine  10 mg Oral Daily  . [MAR Hold] mometasone-formoterol  2 puff Inhalation BID  . [MAR Hold] montelukast  10 mg Oral QHS  . [MAR Hold] multivitamin with minerals  1 tablet Oral Daily  . [MAR Hold] pantoprazole  40 mg Oral BID  . [MAR Hold] pramipexole  0.375 mg Oral QHS  . [MAR Hold] rosuvastatin  5 mg Oral QHS  . [MAR Hold] senna  1 tablet Oral BID  . [MAR Hold] tiotropium  18 mcg Inhalation Daily   Continuous: . sodium chloride 75 mL/hr at 12/22/16 1000  . ceFAZolin    .  ceFAZolin (ANCEF)  IV    . [MAR Hold] methocarbamol (ROBAXIN)  IV     PRN:[MAR Hold] albuterol, [MAR Hold] alum & mag hydroxide-simeth, [MAR Hold] benzonatate, [MAR Hold] HYDROcodone-acetaminophen, [MAR Hold]  HYDROmorphone (DILAUDID) injection, [MAR Hold] methocarbamol **OR** [MAR Hold] methocarbamol (ROBAXIN)  IV, [MAR Hold] ondansetron (ZOFRAN) IV, [MAR Hold] polyethylene glycol  Assessment/Plan:  Principal Problem:   Closed left hip fracture (HCC) Active Problems:   Hypertensive heart disease without CHF   Severe persistent asthma   History of DVT of lower extremity   CKD (chronic kidney disease) stage 3, GFR 30-59 ml/min   CAD (coronary artery disease), native coronary artery    Left femoral neck fracture This was a result of a mechanical fall. Orthopedics was consulted. Surgery had to wait as the patient was on Xarelto prior to admission. Plan is for surgical intervention today. She did have a few episodes of nausea, vomiting yesterday, which is thought to be due to pain medications. She denies any shortness of breath, chest pain. Abdomen is benign. Continue to monitor for now.   Preoperative evaluation See H&P for details. Due to new EKG changes and new lower extremity edema an echocardiogram was ordered. Please see report above. Systolic function is normal. No wall motion abnormalities. Cardiology was also consulted and appreciate their input. No need for any further testing at this time prior to surgery.  History of severe persistent asthma Stable currently. Continue her medications. Will need incentive spirometry postoperatively. Will need close monitoring of pulmonary status.  History of chronic diastolic CHF/pedal edema Echocardiogram does not show any worsening heart failure. Grade 1 diastolic dysfunction noted. Reason for her peripheral edema is not entirely clear. She was given 1 dose of IV Lasix on the day of admission. Lower extremity Doppler studies negative for DVT. Now somewhat volume  depleted due to episodes of vomiting. She also has hyponatremia.  History of lower extremity DVT. Last episode of DVT was 2 years ago. Continues to be an anticoagulation with Xarelto. This has been held for surgery.  Currently no indication for bridging therapy with heparin. No DVT detected on lower extremity Doppler studies.  Chronic kidney disease stage III/hyponatremia Seems to be at baseline. Continue to monitor.hyponatremia, likely due to volume loss from nausea, vomiting. Will give gentle IV hydration.  History of essential hypertension. Blood pressure was borderline low. Not noted to be on any antihypertensives. Could be due to pain medications. Improved today. Continue to monitor.  DVT Prophylaxis: SCDs    Code Status: Full code  Family Communication: Discussed with patient  Disposition Plan: Surgery today.    LOS: 3 days   [facility] Pager [phone] 12/22/2016, 1:13  PM  If 7PM-7AM, please contact night-coverage at [email or web address], password [facility]

## 2016-12-23 DIAGNOSIS — E871 Hypo-osmolality and hyponatremia: Secondary | ICD-10-CM

## 2016-12-23 LAB — CBC
HCT: 31 % — ABNORMAL LOW (ref 36.0–46.0)
Hemoglobin: 10.4 g/dL — ABNORMAL LOW (ref 12.0–15.0)
MCH: 30.7 pg (ref 26.0–34.0)
MCHC: 33.5 g/dL (ref 30.0–36.0)
MCV: 91.4 fL (ref 78.0–100.0)
PLATELETS: 233 10*3/uL (ref 150–400)
RBC: 3.39 MIL/uL — AB (ref 3.87–5.11)
RDW: 12.9 % (ref 11.5–15.5)
WBC: 7.4 10*3/uL (ref 4.0–10.5)

## 2016-12-23 LAB — BASIC METABOLIC PANEL
Anion gap: 9 (ref 5–15)
BUN: 19 mg/dL (ref 6–20)
CHLORIDE: 95 mmol/L — AB (ref 101–111)
CO2: 26 mmol/L (ref 22–32)
CREATININE: 1.22 mg/dL — AB (ref 0.44–1.00)
Calcium: 8.1 mg/dL — ABNORMAL LOW (ref 8.9–10.3)
GFR calc Af Amer: 47 mL/min — ABNORMAL LOW (ref >60)
GFR, EST NON AFRICAN AMERICAN: 40 mL/min — AB (ref >60)
GLUCOSE: 126 mg/dL — AB (ref 65–99)
POTASSIUM: 4.3 mmol/L (ref 3.5–5.1)
Sodium: 130 mmol/L — ABNORMAL LOW (ref 135–145)

## 2016-12-23 LAB — MAGNESIUM: Magnesium: 1.6 mg/dL — ABNORMAL LOW (ref 1.7–2.4)

## 2016-12-23 MED ORDER — HYDROCODONE-ACETAMINOPHEN 5-325 MG PO TABS: 1.0000 | ORAL_TABLET | Freq: Four times a day (QID) | ORAL | 0 refills | Status: DC | PRN

## 2016-12-23 MED ORDER — METHOCARBAMOL 500 MG PO TABS: 500.0000 mg | ORAL_TABLET | Freq: Four times a day (QID) | ORAL | 0 refills | Status: DC | PRN

## 2016-12-23 MED ORDER — MAGNESIUM SULFATE 2 GM/50ML IV SOLN
2.0000 g | Freq: Once | INTRAVENOUS | Status: AC
  Administered 2016-12-23: 2 g via INTRAVENOUS
  Filled 2016-12-23: qty 50

## 2016-12-23 NOTE — Evaluation (Signed)
Physical Therapy Evaluation Patient Details Name: [name] MRN: [id number] DOB: May 16, 1932 Today's Date: 12/23/2016   History of Present Illness  This 83 year female was admitted after a fall in which she sustained a L displaced femoral neck fx. She is s/p posterior approach THA.  PMH:  asthma, CHF, HTN, breast CA, and cardiac cath  Clinical Impression  Patient complained of nausea throughout, dry heaves at times.  The patient did have a drop in  BP after assisting to recliner, noted to be diaphoretic.(BP in doc flow sheets.) The patient has been  In bed x 4 days. Pt admitted with above diagnosis. Pt currently with functional limitations due to the deficits listed below (see PT Problem List).  Pt will benefit from skilled PT to increase their independence and safety with mobility to allow discharge to the venue listed below.       Follow Up Recommendations CIR    Equipment Recommendations  None recommended by PT    Recommendations for Other Services       Precautions / Restrictions Precautions Precautions: Posterior Hip;Fall Restrictions Weight Bearing Restrictions: No LLE Weight Bearing: Weight bearing as tolerated      Mobility  Bed Mobility Overal bed mobility: Needs Assistance Bed Mobility: Supine to Sit     Supine to sit: Max assist;+2 for physical assistance     General bed mobility comments: cues for sequence. Assist for legs and trunk  Transfers Overall transfer level: Needs assistance Equipment used: Rolling walker (2 wheeled) Transfers: Sit to/from [facility] Sit to Stand: Mod assist Stand pivot transfers: Mod assist       General transfer comment: assist to stand. Cues for LE placement and THPs.   Ambulation/Gait                Stairs            Wheelchair Mobility    Modified Rankin (Stroke Patients Only)       Balance Overall balance assessment: History of Falls                                            Pertinent Vitals/Pain Pain Assessment: Faces Faces Pain Scale: Hurts little more Pain Location: bil LEs, L shoulder also sore, but improving Pain Descriptors / Indicators: Aching Pain Intervention(s): Limited activity within patient's tolerance;Monitored during session;Premedicated before session;Repositioned;Ice applied    Home Living Family/patient expects to be discharged to:: Private residence Living Arrangements: Spouse/significant other Available Help at Discharge: Family Type of Home: House Home Access: Stairs to enter Entrance Stairs-Rails: Right Entrance Stairs-Number of Steps: 2 Home Layout: One level Home Equipment: Walker - 2 wheels      Prior Function Level of Independence: Independent               Hand Dominance        Extremity/Trunk Assessment   Upper Extremity Assessment Upper Extremity Assessment: Defer to OT evaluation LUE Deficits / Details: able to lift to 90; sore from fall, especially at elbow.  Edema present in hand and elbow    Lower Extremity Assessment Lower Extremity Assessment: LLE deficits/detail LLE Deficits / Details: assistance required to move the left leg, Able to bear weight minimally and able to take a few states    Cervical / Trunk Assessment Cervical / Trunk Assessment: Normal  Communication   Communication: No difficulties  Cognition  Arousal/Alertness: Awake/alert Behavior During Therapy: WFL for tasks assessed/performed Overall Cognitive Status: Within Functional Limits for tasks assessed                                        General Comments      Exercises     Assessment/Plan    PT Assessment Patient needs continued PT services  PT Problem List Decreased strength;Decreased range of motion;Decreased activity tolerance;Decreased balance;Decreased mobility;Decreased knowledge of precautions;Cardiopulmonary status limiting activity;Decreased safety awareness;Decreased knowledge of  use of DME       PT Treatment Interventions DME instruction;Gait training;Functional mobility training;Therapeutic activities;Therapeutic exercise;Patient/family education    PT Goals (Current goals can be found in the Care Plan section)  Acute Rehab PT Goals Patient Stated Goal: return to independence    Frequency Min 3X/week   Barriers to discharge        Co-evaluation PT/OT/SLP Co-Evaluation/Treatment: Yes   PT goals addressed during session: Mobility/safety with mobility OT goals addressed during session: ADL's and self-care       AM-PAC PT "6 Clicks" Daily Activity  Outcome Measure Difficulty turning over in bed (including adjusting bedclothes, sheets and blankets)?: Total Difficulty moving from lying on back to sitting on the side of the bed? : Total Difficulty sitting down on and standing up from a chair with arms (e.g., wheelchair, bedside commode, etc,.)?: Total Help needed moving to and from a bed to chair (including a wheelchair)?: Total Help needed walking in hospital room?: Total Help needed climbing 3-5 steps with a railing? : Total 6 Click Score: 6    End of Session Equipment Utilized During Treatment: Gait belt Activity Tolerance: Treatment limited secondary to medical complications (Comment) (constant nausea) Patient left: in chair;with call bell/phone within reach   PT Visit Diagnosis: Difficulty in walking, not elsewhere classified (R26.2);History of falling (Z91.81)    Time: 1761-6073 PT Time Calculation (min) (ACUTE ONLY): 45 min   Charges:   PT Evaluation $PT Eval Moderate Complexity: 1 Mod PT Treatments $Gait Training: 8-22 mins   PT G CodesTresa Endo PT [phone]   [name] 12/23/2016, 1:52 PM

## 2016-12-23 NOTE — Clinical Social Work Note (Signed)
Clinical Social Work Assessment  Patient Details  Name: [name] MRN: [id number] Date of Birth: 1932/11/16  Date of referral:  12/23/16               Reason for consult:  Facility Placement                Permission sought to share information with:  Family Supports Permission granted to share information::  Yes, Verbal Permission Granted  Name::        Agency::   SNF  Relationship::  Spouse/nieces   Contact Information:     Housing/Transportation Living arrangements for the past 2 months:  Single Family Home Source of Information:  Patient Patient Interpreter Needed:  None Criminal Activity/Legal Involvement Pertinent to Current Situation/Hospitalization:  No - Comment as needed Significant Relationships:  Spouse, Other Family Members Lives with:    Do you feel safe going back to the place where you live?  Yes Need for family participation in patient care:  Yes(left hip hemiarthroplasty). Prior to fall patient very independent.   Care giving concerns:  Fall and pain in the left hip.    Social Worker assessment / plan:  CSW met with patient, niece, and spouse at bedside, explain role and reason for csw visit. Patient reports she is interested in rehab before returning home. Patient reports she thought she could return home with home services to complete rehab but is in too much pain. Patient reports she prefers a facility close to her home. CSW educated patient about faxing out process. Patient gave CSW permission to update her nieces and involve them in her transition process.  CSW complete FL2, PASRR.Provide bed offers.   Plan: Assist discharge to SNF.   Employment status:  Retired Forensic scientist:  [employer] PT Recommendations:  [place] (Pending) Information / Referral to community resources:  [place]  Patient/Family's Response to care: Agreeable and responding well to care. Appreciative of CSW services.   Patient/Family's  Understanding of and Emotional Response to Diagnosis, Current Treatment, and Prognosis:  Patient niece reports, " there is someone always with her, either me or my sister." Patient family at beside very involved and understand pt. Current condition and level of treatment to progress back to her independence.   Emotional Assessment Appearance:  Appears stated age Attitude/Demeanor/Rapport:    Affect (typically observed):  Accepting, Calm, Pleasant Orientation:  Oriented to Self, Oriented to Place, Oriented to  Time, Oriented to Situation Alcohol / Substance use:  Not Applicable Psych involvement (Current and /or in the community):  No  Discharge Needs  Concerns to be addressed:  Discharge Planning Concerns Readmission within the last 30 days:  No Current discharge risk:  Dependent with Mobility Barriers to Discharge:  Continued Medical Work up   [employer], LCSW 12/23/2016, 9:51 AM

## 2016-12-23 NOTE — Evaluation (Signed)
Occupational Therapy Evaluation Patient Details Name: Kaiana Marion MRN: [id number] DOB: [birth year] Today's Date: 12/23/2016    History of Present Illness This 83 year female was admitted after a fall in which she sustained a L displaced femoral neck fx. She is s/p posterior approach THA.  PMH:  asthma, CHF, HTN, breast CA, and cardiac cath   Clinical Impression   Pt was admitted for the above. At baseline, she is independent with adls/iadls.  Pt currently needs Mod +2 for SPT and up to total +2 for ADLs. She has posterior THPS.  Pt will benefit from continued OT to increase safety and independence with adls.  Goals in acute are for min A levels.  Pt was feeling nauseous and pushed through and was able to get up to chair    Follow Up Recommendations  CIR   Equipment Recommendations  3 in 1 bedside commode    Recommendations for Other Services       Precautions / Restrictions Precautions Precautions: Posterior Hip;Fall Restrictions Weight Bearing Restrictions: No LLE Weight Bearing: Weight bearing as tolerated      Mobility Bed Mobility Overal bed mobility: Needs Assistance Bed Mobility: Supine to Sit     Supine to sit: Max assist;+2 for physical assistance     General bed mobility comments: cues for sequence. Assist for legs and trunk  Transfers Overall transfer level: Needs assistance Equipment used: Rolling walker (2 wheeled) Transfers: Sit to/from [facility] Sit to Stand: Mod assist Stand pivot transfers: Mod assist       General transfer comment: assist to stand. Cues for LE placement and THPs.     Balance                                           ADL either performed or assessed with clinical judgement   ADL Overall ADL's : Needs assistance/impaired     Grooming: Set up;Sitting   Upper Body Bathing: Set up;Sitting   Lower Body Bathing: Maximal assistance;Sit to/from stand;+2 for physical assistance   Upper Body  Dressing : Min guard;Sitting   Lower Body Dressing: Total assistance;+2 for physical assistance;Sit to/from stand   Toilet Transfer: Moderate assistance;Stand-pivot;RW (to chair)   Toileting- Clothing Manipulation and Hygiene: Maximal assistance;+2 for physical assistance;Sit to/from stand         General ADL Comments: Pt felt nauseous during session.  Took extra rest breaks as she has been in bed since Friday.       Vision         Perception     Praxis      Pertinent Vitals/Pain Pain Assessment: Faces Faces Pain Scale: Hurts little more Pain Location: bil LEs, L shoulder also sore, but improving Pain Descriptors / Indicators: Aching Pain Intervention(s): Limited activity within patient's tolerance;Monitored during session;Premedicated before session;Repositioned     Hand Dominance     Extremity/Trunk Assessment Upper Extremity Assessment Upper Extremity Assessment: LUE deficits/detail;Generalized weakness LUE Deficits / Details: able to lift to 90; sore from fall, especially at elbow.  Edema present in hand and elbow           Communication Communication Communication: No difficulties   Cognition Arousal/Alertness: Awake/alert Behavior During Therapy: WFL for tasks assessed/performed Overall Cognitive Status: Within Functional Limits for tasks assessed  General Comments       Exercises     Shoulder Instructions      Home Living Family/patient expects to be discharged to:: Private residence Living Arrangements: Spouse/significant other Available Help at Discharge: Family Type of Home: Education officer, community of Steps: 2 Entrance Stairs-Rails: Right Home Layout: One level         Bathroom Toilet: Handicapped height     Home Equipment: Environmental consultant - 2 wheels          Prior Functioning/Environment Level of Independence: Independent                 OT Problem List: Decreased  strength;Decreased activity tolerance;Cardiopulmonary status limiting activity;Pain;Decreased knowledge of use of DME or AE;Decreased knowledge of precautions      OT Treatment/Interventions: Self-care/ADL training;DME and/or AE instruction;Patient/family education    OT Goals(Current goals can be found in the care plan section) Acute Rehab OT Goals Patient Stated Goal: return to independence OT Goal Formulation: With patient Time For Goal Achievement: 12/30/16 Potential to Achieve Goals: Good ADL Goals Pt Will Perform Grooming: with min guard assist;standing Pt Will Perform Lower Body Bathing: with min assist;with adaptive equipment;sit to/from stand Pt Will Perform Lower Body Dressing: with min assist;with adaptive equipment;sit to/from stand Pt Will Transfer to Toilet: with min assist;ambulating;bedside commode Pt Will Perform Toileting - Clothing Manipulation and hygiene: sit to/from stand;with min guard assist  OT Frequency: Min 2X/week   Barriers to D/C:            Co-evaluation PT/OT/SLP Co-Evaluation/Treatment: Yes   PT goals addressed during session: Mobility/safety with mobility OT goals addressed during session: ADL's and self-care;Other (comment) (precautions)      AM-PAC PT "6 Clicks" Daily Activity     Outcome Measure Help from another person eating meals?: None Help from another person taking care of personal grooming?: A Little Help from another person toileting, which includes using toliet, bedpan, or urinal?: A Lot Help from another person bathing (including washing, rinsing, drying)?: A Lot Help from another person to put on and taking off regular upper body clothing?: A Little Help from another person to put on and taking off regular lower body clothing?: A Lot 6 Click Score: 16   End of Session Nurse Communication: Mobility status;Precautions  Activity Tolerance: Patient limited by fatigue Patient left: in chair;with call bell/phone within reach;with  family/visitor present  OT Visit Diagnosis: Pain Pain - Right/Left: Left Pain - part of body: Hip                Time: [id number] OT Time Calculation (min): 45 min Charges:  OT General Charges $OT Visit: 1 Procedure OT Evaluation $OT Eval Low Complexity: 1 Procedure G-Codes:     Lesle Chris, OTR/L [phone] 12/23/2016  Koben Daman 12/23/2016, 12:49 PM

## 2016-12-23 NOTE — Progress Notes (Signed)
[facility] PROGRESS NOTE  [name] WJX:[id number] DOB: 1932-10-13 DOA: 12/19/2016  PCP: Crist Infante, MD  Brief History/Interval Summary: 83 year old Caucasian female with a past medical history of asthma, chronic diastolic CHF, hypertension, remote history of breast cancer, history of DVT for which she continues to be on Xarelto presented after a mechanical fall resulted in left hip fracture. Patient was hospitalized for further management.  Reason for Visit: Left hip fracture  Consultants: Orthopedics. Cardiology  Procedures:  Transthoracic echocardiogram Study Conclusions  - Left ventricle: The cavity size was normal. Wall thickness was   increased increased in a pattern of mild to moderate LVH.   Systolic function was normal. The estimated ejection fraction was   in the range of 55% to 60%. Wall motion was normal; there were no   regional wall motion abnormalities. Doppler parameters are   consistent with abnormal left ventricular relaxation (grade 1   diastolic dysfunction). - Ventricular septum: Septal motion showed mild dyssynergy. These   changes are consistent with a left bundle branch block. - Mitral valve: Calcified annulus.  Left hip hemiarthroplasty 8/13  Antibiotics: None  Subjective/Interval History: Patient complains of pain all over. Reports poor appetite. No further episodes of nausea, vomiting so far today. No chest pain or shortness of breath.   ROS: No headaches.  Objective:  Vital Signs  Vitals:   12/22/16 1855 12/22/16 2041 12/23/16 0453 12/23/16 0842  BP: (!) 120/44 (!) 120/45 (!) 119/47   Pulse: 89 86 90   Resp: 14 16 20    Temp: 97.7 F (36.5 C) 97.9 F (36.6 C) 98.3 F (36.8 C)   TempSrc: Axillary Oral Oral   SpO2: 96% 100% 96% 96%  Weight:      Height:        Intake/Output Summary (Last 24 hours) at 12/23/16 0914 Last data filed at 12/23/16 0531  Gross per 24 hour  Intake             2310 ml  Output              1000 ml  Net             1310 ml   Filed Weights   12/19/16 1604  Weight: 68.9 kg (152 lb)    General appearance: Awake and alert. In no distress. Seems to be in some discomfort. Resp: Clear to auscultation bilaterally. No wheezes, rales or rhonchi. Cardio: S1, S2 normal, regular. S3, S4. No rubs, murmurs or bruits GI: Abdomen is soft. Nontender. Nondistended. Bowel sounds are present. No masses or organomegaly Extremities: No bruising noted over the left thigh area. Neurologic: No focal deficits  Lab Results:  Data Reviewed: I have personally reviewed following labs and imaging studies  CBC:  Recent Labs Lab 12/19/16 1650 12/20/16 0600 12/23/16 0548  WBC 8.7 7.9 7.4  NEUTROABS 6.7  --   --   HGB 12.3 11.8* 10.4*  HCT 37.5 36.0 31.0*  MCV 91.2 90.5 91.4  PLT 284 219 213    Basic Metabolic Panel:  Recent Labs Lab 12/19/16 1650 12/20/16 0600 12/22/16 0641 12/23/16 0548  NA 138 136 130* 130*  K 3.8 3.7 4.3 4.3  CL 101 99* 95* 95*  CO2 28 27 27 26   GLUCOSE 114* 115* 110* 126*  BUN 16 15 19 19   CREATININE 1.23* 1.24* 1.28* 1.22*  CALCIUM 9.3 8.8* 8.5* 8.1*  MG  --   --   --  1.6*    GFR: Estimated Creatinine Clearance:  33.7 mL/min (A) (by C-G formula based on SCr of 1.22 mg/dL (H)).  Coagulation Profile:  Recent Labs Lab 12/19/16 1650 12/22/16 0840  INR 1.98 1.18     Radiology Studies: Pelvis Portable  Result Date: 12/22/2016 CLINICAL DATA:  Hip replacement . EXAM: PORTABLE PELVIS 1-2 VIEWS COMPARISON:  CT 06/26/2009 . FINDINGS: Left hip replacement. Anatomic alignment on AP view. Hardware intact. No acute abnormality. IMPRESSION: Left hip replacement.  Anatomic alignment on AP view. Electronically Signed   By: [place]   On: 12/22/2016 15:53     Medications:  Scheduled: . docusate sodium  100 mg Oral BID  . feeding supplement (ENSURE ENLIVE)  237 mL Oral BID BM  . ferrous sulfate  325 mg Oral BID WC  . loratadine  10 mg Oral Daily  .  mometasone-formoterol  2 puff Inhalation BID  . montelukast  10 mg Oral QHS  . multivitamin with minerals  1 tablet Oral Daily  . pantoprazole  40 mg Oral BID  . pramipexole  0.375 mg Oral QHS  . rivaroxaban  20 mg Oral Daily  . rosuvastatin  5 mg Oral QHS  . senna  1 tablet Oral BID  . tiotropium  18 mcg Inhalation Daily   Continuous: . sodium chloride 50 mL/hr at 12/22/16 1700  . methocarbamol (ROBAXIN)  IV    . methocarbamol (ROBAXIN)  IV     NUU:[id number] **OR** acetaminophen, albuterol, alum & mag hydroxide-simeth, benzonatate, HYDROcodone-acetaminophen, HYDROmorphone (DILAUDID) injection, menthol-cetylpyridinium **OR** phenol, methocarbamol **OR** methocarbamol (ROBAXIN)  IV, methocarbamol **OR** methocarbamol (ROBAXIN)  IV, metoCLOPramide **OR** metoCLOPramide (REGLAN) injection, ondansetron (ZOFRAN) IV, ondansetron **OR** ondansetron (ZOFRAN) IV, polyethylene glycol, polyethylene glycol  Assessment/Plan:  Principal Problem:   Closed left hip fracture (HCC) Active Problems:   Hypertensive heart disease without CHF   Severe persistent asthma   History of DVT of lower extremity   CKD (chronic kidney disease) stage 3, GFR 30-59 ml/min   CAD (coronary artery disease), native coronary artery    Left femoral neck fracture This was a result of a mechanical fall. Orthopedics was consulted. Surgery had to wait as the patient was on Xarelto prior to admission. Patient underwent left hemiarthroplasty on 8/13. Continues to have discomfort. Orthopedics is following. Pain control. Nausea, vomiting was likely due to pain medications. Seems to be better today. PT and OT evaluation. Back on Xarelto. Patient is medically stable at this time.  History of severe persistent asthma Stable currently. Continue her medications. Incentive spirometry.   History of chronic diastolic CHF/pedal edema Echocardiogram does not show any worsening heart failure. Grade 1 diastolic dysfunction noted.  Reason for her peripheral edema is not entirely clear. She was given 1 dose of IV Lasix on the day of admission. Lower extremity Doppler studies negative for DVT. Continue gentle hydration for another 24 hours. Holding her Lasix.  Hyponatremia Possibly due to volume depletion. Sodium level stable. Repeat tomorrow. Continue normal saline.  History of lower extremity DVT. Last episode of DVT was 2 years ago. Continues to be an anticoagulation with Xarelto. This was held for surgery. No DVT detected on lower extremity Doppler studies. Xarelto has been resumed.  Chronic kidney disease stage III/hypomagnesemia Seems to be at baseline. Continue to monitor. Replace magnesium. Repeat labs tomorrow.  History of essential hypertension. Blood pressure is reasonably well controlled. Holding her ARB for now.  DVT Prophylaxis: SCDs    Code Status: Full code  Family Communication: Discussed with patient  Disposition Plan: management as outlined above.  PT and OT evaluation pending. Disposition unknown at this time.    LOS: 4 days   [facility] Pager [phone] 12/23/2016, 9:14 AM  If 7PM-7AM, please contact night-coverage at [email or web address], password [facility]

## 2016-12-23 NOTE — Discharge Instructions (Signed)

## 2016-12-23 NOTE — Progress Notes (Signed)
Rehab Admissions Coordinator Note:  Patient was screened by Cleatrice Burke for appropriateness for an Inpatient Acute Rehab Consult per PT and OT recommendations. I spoke with Leonides Grills CM for [facility]. Pt is included with the orthopedic bundle. Home with [facility] and 24/7 care of family is recommended at this time, not CIR. Please call me with any questions. Cleatrice Burke 12/23/2016, 2:18 PM  I can be reached at [phone].

## 2016-12-23 NOTE — Care Management Important Message (Signed)
Important Message  Patient Details  Name: [name] MRN: [id number] Date of Birth: 06-13-1932   Medicare Important Message Given:  Yes    Kerin Salen 12/23/2016, 11:13 AMImportant Message  Patient Details  Name: [name] MRN: [id number] Date of Birth: 11/12/1932   Medicare Important Message Given:  Yes    Kerin Salen 12/23/2016, 11:13 AM

## 2016-12-23 NOTE — Progress Notes (Signed)
Patient ID: [name], female   DOB: Jun 25, 1932, 83 y.o.   MRN: [id number] Subjective: 1 Day Post-Op Procedure(s) (LRB): HEMIARTHROPLASTY, LEFT (Left)    Patient reports pain as moderate.  Hurting all over.  Very uncomfortable having to sleep on her back. Otherwise no events over night  Objective:   VITALS:   Vitals:   12/23/16 0453 12/23/16 0842  BP: (!) 119/47   Pulse: 90   Resp: 20   Temp: 98.3 F (36.8 C)   SpO2: 96% 96%    Neurovascular intact Incision: dressing C/D/I  LABS  Recent Labs  12/23/16 0548  HGB 10.4*  HCT 31.0*  WBC 7.4  PLT 233     Recent Labs  12/22/16 0641 12/23/16 0548  NA 130* 130*  K 4.3 4.3  BUN 19 19  CREATININE 1.28* 1.22*  GLUCOSE 110* 126*     Recent Labs  12/22/16 0840  INR 1.18     Assessment/Plan: 1 Day Post-Op Procedure(s) (LRB): HEMIARTHROPLASTY, LEFT (Left)   Advance diet Up with therapy   Based on desire for Home Discharge plan on in house PT evaluation and treatment next 2 days then home likely Thursday.  Reviewed with patient and family that plan  Resumed Xarelto WBAT LLE RTC in 2 weeks Will follow while in hospital

## 2016-12-24 ENCOUNTER — Inpatient Hospital Stay (HOSPITAL_COMMUNITY): Payer: Medicare Other

## 2016-12-24 DIAGNOSIS — S72002A Fracture of unspecified part of neck of left femur, initial encounter for closed fracture: Principal | ICD-10-CM

## 2016-12-24 LAB — CBC
HEMATOCRIT: 29.1 % — AB (ref 36.0–46.0)
Hemoglobin: 9.8 g/dL — ABNORMAL LOW (ref 12.0–15.0)
MCH: 30 pg (ref 26.0–34.0)
MCHC: 33.7 g/dL (ref 30.0–36.0)
MCV: 89 fL (ref 78.0–100.0)
Platelets: 232 10*3/uL (ref 150–400)
RBC: 3.27 MIL/uL — AB (ref 3.87–5.11)
RDW: 12.7 % (ref 11.5–15.5)
WBC: 7.2 10*3/uL (ref 4.0–10.5)

## 2016-12-24 LAB — BASIC METABOLIC PANEL
ANION GAP: 8 (ref 5–15)
BUN: 21 mg/dL — ABNORMAL HIGH (ref 6–20)
CHLORIDE: 95 mmol/L — AB (ref 101–111)
CO2: 25 mmol/L (ref 22–32)
CREATININE: 1.2 mg/dL — AB (ref 0.44–1.00)
Calcium: 8.1 mg/dL — ABNORMAL LOW (ref 8.9–10.3)
GFR calc Af Amer: 48 mL/min — ABNORMAL LOW (ref >60)
GFR calc non Af Amer: 41 mL/min — ABNORMAL LOW (ref >60)
GLUCOSE: 122 mg/dL — AB (ref 65–99)
Potassium: 4.3 mmol/L (ref 3.5–5.1)
Sodium: 128 mmol/L — ABNORMAL LOW (ref 135–145)

## 2016-12-24 LAB — MAGNESIUM: Magnesium: 1.9 mg/dL (ref 1.7–2.4)

## 2016-12-24 MED ORDER — SENNOSIDES-DOCUSATE SODIUM 8.6-50 MG PO TABS
2.0000 | ORAL_TABLET | Freq: Two times a day (BID) | ORAL | Status: DC
  Administered 2016-12-24 – 2016-12-26 (×4): 2 via ORAL
  Filled 2016-12-24 (×6): qty 2

## 2016-12-24 MED ORDER — BISACODYL 10 MG RE SUPP
10.0000 mg | Freq: Every day | RECTAL | Status: DC | PRN
  Administered 2016-12-25: 10 mg via RECTAL
  Filled 2016-12-24: qty 1

## 2016-12-24 MED ORDER — POLYETHYLENE GLYCOL 3350 17 G PO PACK
17.0000 g | PACK | Freq: Every day | ORAL | Status: DC
  Administered 2016-12-24 – 2016-12-26 (×3): 17 g via ORAL
  Filled 2016-12-24 (×3): qty 1

## 2016-12-24 MED ORDER — OXYCODONE HCL 5 MG PO TABS
5.0000 mg | ORAL_TABLET | ORAL | Status: DC | PRN
  Administered 2016-12-24 – 2016-12-25 (×2): 5 mg via ORAL
  Filled 2016-12-24 (×2): qty 1

## 2016-12-24 NOTE — Progress Notes (Signed)
Occupational Therapy Treatment Patient Details Name: [name] MRN: [id number] DOB: May 16, 1932 Today's Date: 12/24/2016    History of present illness This 83 year female was admitted after a fall in which she sustained a L displaced femoral neck fx. She is s/p posterior approach THA.  PMH:  asthma, CHF, HTN, breast CA, and cardiac cath   OT comments  Pt able to perform SPT with +1 assist today.  Pt still feels nauseous and extra time is required.  Reviewed THPs.  Pt is concerned about returning home with husband as FT caregiver and others coming in intermittently  Follow Up Recommendations  SNF;Supervision/Assistance - 24 hour (vs.  Pt is worried husband won't be able to assist )  IF HOME, recommend [facility]   Equipment Recommendations  3 in 1 bedside commode    Recommendations for Other Services      Precautions / Restrictions Precautions Precautions: Posterior Hip;Fall Restrictions Weight Bearing Restrictions: No LLE Weight Bearing: Weight bearing as tolerated       Mobility Bed Mobility         Supine to sit: Mod assist     General bed mobility comments: HOB raised. Pt used bed rails, cues for sequence and assist for RLE then trunk.  Utilized chux pad to help bring her to EOB  Transfers Overall transfer level: Needs assistance Equipment used: Rolling walker (2 wheeled)   Sit to Stand: Min assist Stand pivot transfers: Min assist       General transfer comment: cues for UE/LE placement as well as THPs    Balance                                           ADL either performed or assessed with clinical judgement   ADL                           Toilet Transfer: Minimal assistance;Stand-pivot;BSC;RW   Toileting- Clothing Manipulation and Hygiene: Total assistance;Sit to/from stand         General ADL Comments: transferred to 3:1 and sat for an extended period of time; then transferred to recliner.  Pt very concerned that  husband will not be able to assist her a home.  She did better today.  focused on trying to not get sick to her stomach.  Reviewed THPs.      Vision       Perception     Praxis      Cognition Arousal/Alertness: Awake/alert Behavior During Therapy: WFL for tasks assessed/performed Overall Cognitive Status: Within Functional Limits for tasks assessed                                          Exercises     Shoulder Instructions       General Comments      Pertinent Vitals/ Pain       Faces Pain Scale: Hurts even more Pain Location: R hip Pain Descriptors / Indicators: [place]                                          Prior Functioning/Environment  Frequency           Progress Toward Goals  OT Goals(current goals can now be found in the care plan section)     Acute Rehab OT Goals Patient Stated Goal: return to independence Time For Goal Achievement: 12/30/16  Plan      Co-evaluation                 AM-PAC PT "6 Clicks" Daily Activity     Outcome Measure   Help from another person eating meals?: None Help from another person taking care of personal grooming?: A Little Help from another person toileting, which includes using toliet, bedpan, or urinal?: A Lot Help from another person bathing (including washing, rinsing, drying)?: A Lot Help from another person to put on and taking off regular upper body clothing?: A Little Help from another person to put on and taking off regular lower body clothing?: A Lot 6 Click Score: 16    End of Session    OT Visit Diagnosis: Pain Pain - Right/Left: Left Pain - part of body: Hip   Activity Tolerance Patient limited by fatigue   Patient Left in chair;with call bell/phone within reach;with family/visitor present   Nurse Communication          Time: 4174-0814 OT Time Calculation (min): 47 min  Charges: OT General Charges $OT Visit: 1  Procedure OT Treatments $Self Care/Home Management : 23-37 mins $Therapeutic Activity: 8-22 mins  Lesle Chris, OTR/L [phone] 12/24/2016   Amairani Shuey 12/24/2016, 1:25 PM

## 2016-12-24 NOTE — Progress Notes (Signed)
[facility] PROGRESS NOTE  Dreonna Tanton BDZ:[id number] DOB: 12-31-1932 DOA: 12/19/2016  PCP: Crist Infante, MD  Brief History/Interval Summary: 83 year old Caucasian female with a past medical history of asthma, chronic diastolic CHF, hypertension, remote history of breast cancer, history of DVT for which she continues to be on Xarelto presented after a mechanical fall resulted in left hip fracture. Patient was hospitalized for further management.  Reason for Visit: Left hip fracture  Consultants: Orthopedics. Cardiology  Procedures:  Transthoracic echocardiogram Study Conclusions  - Left ventricle: The cavity size was normal. Wall thickness was   increased increased in a pattern of mild to moderate LVH.   Systolic function was normal. The estimated ejection fraction was   in the range of 55% to 60%. Wall motion was normal; there were no   regional wall motion abnormalities. Doppler parameters are   consistent with abnormal left ventricular relaxation (grade 1   diastolic dysfunction). - Ventricular septum: Septal motion showed mild dyssynergy. These   changes are consistent with a left bundle branch block. - Mitral valve: Calcified annulus.  Left hip hemiarthroplasty 8/13  Antibiotics: None  Subjective/Interval History: Continues to have some nausea. Pain is well-controlled. No bowel movement since admission.  ROS: No headaches.  Objective:  Vital Signs  Vitals:   12/23/16 1333 12/23/16 2052 12/24/16 0621 12/24/16 1533  BP: (!) 88/57 (!) 104/44 (!) 111/41 (!) 130/58  Pulse: 83 76 78 88  Resp: 15 18 18 20   Temp: (!) 96.4 F (35.8 C) 97.9 F (36.6 C) (!) 97.5 F (36.4 C) 98 F (36.7 C)  TempSrc: Oral Oral Oral Oral  SpO2: 100% 100% 95% 97%  Weight:      Height:        Intake/Output Summary (Last 24 hours) at 12/24/16 1758 Last data filed at 12/24/16 0836  Gross per 24 hour  Intake              240 ml  Output                0 ml  Net               240 ml   Filed Weights   12/19/16 1604  Weight: 68.9 kg (152 lb)    General appearance: Awake and alert. In no distress. Seems to be in some discomfort. Resp: Clear to auscultation bilaterally. No wheezes, rales or rhonchi. Cardio: S1, S2 normal, regular. S3, S4. No rubs, murmurs or bruits GI: Abdomen is soft. Nontender. Nondistended. Bowel sounds are present. No masses or organomegaly Extremities: No bruising noted over the left thigh area. Neurologic: No focal deficits  Lab Results:  Data Reviewed: I have personally reviewed following labs and imaging studies  CBC:  Recent Labs Lab 12/19/16 1650 12/20/16 0600 12/23/16 0548 12/24/16 0931  WBC 8.7 7.9 7.4 7.2  NEUTROABS 6.7  --   --   --   HGB 12.3 11.8* 10.4* 9.8*  HCT 37.5 36.0 31.0* 29.1*  MCV 91.2 90.5 91.4 89.0  PLT 284 219 233 341    Basic Metabolic Panel:  Recent Labs Lab 12/19/16 1650 12/20/16 0600 12/22/16 0641 12/23/16 0548 12/24/16 0726  NA 138 136 130* 130* 128*  K 3.8 3.7 4.3 4.3 4.3  CL 101 99* 95* 95* 95*  CO2 28 27 27 26 25   GLUCOSE 114* 115* 110* 126* 122*  BUN 16 15 19 19  21*  CREATININE 1.23* 1.24* 1.28* 1.22* 1.20*  CALCIUM 9.3 8.8* 8.5* 8.1* 8.1*  MG  --   --   --  1.6* 1.9    GFR: Estimated Creatinine Clearance: 34.2 mL/min (A) (by C-G formula based on SCr of 1.2 mg/dL (H)).  Coagulation Profile:  Recent Labs Lab 12/19/16 1650 12/22/16 0840  INR 1.98 1.18     Radiology Studies: Dg Humerus Left  Result Date: 12/24/2016 CLINICAL DATA:  [place] 5 days ago, distal LEFT humeral pain for 1 day EXAM: LEFT HUMERUS - 2+ VIEW COMPARISON:  None FINDINGS: Osseous demineralization. Shoulder and elbow joint alignments grossly normal. No acute fracture, dislocation, or bone destruction. IMPRESSION: No acute osseous abnormalities. Electronically Signed   By: Lavonia Dana M.D.   On: 12/24/2016 14:01     Medications:  Scheduled: . docusate sodium  100 mg Oral BID  . feeding supplement  (ENSURE ENLIVE)  237 mL Oral BID BM  . ferrous sulfate  325 mg Oral BID WC  . loratadine  10 mg Oral Daily  . mometasone-formoterol  2 puff Inhalation BID  . montelukast  10 mg Oral QHS  . multivitamin with minerals  1 tablet Oral Daily  . pantoprazole  40 mg Oral BID  . polyethylene glycol  17 g Oral Daily  . pramipexole  0.375 mg Oral QHS  . rivaroxaban  20 mg Oral Daily  . rosuvastatin  5 mg Oral QHS  . senna-docusate  2 tablet Oral BID  . tiotropium  18 mcg Inhalation Daily   Continuous: . methocarbamol (ROBAXIN)  IV     WUJ:[id number] **OR** acetaminophen, albuterol, alum & mag hydroxide-simeth, benzonatate, bisacodyl, HYDROcodone-acetaminophen, HYDROmorphone (DILAUDID) injection, menthol-cetylpyridinium **OR** phenol, methocarbamol **OR** methocarbamol (ROBAXIN)  IV, metoCLOPramide **OR** metoCLOPramide (REGLAN) injection, ondansetron **OR** ondansetron (ZOFRAN) IV, oxyCODONE  Assessment/Plan:  Principal Problem:   Closed left hip fracture (HCC) Active Problems:   Hypertensive heart disease without CHF   Severe persistent asthma   History of DVT of lower extremity   CKD (chronic kidney disease) stage 3, GFR 30-59 ml/min   CAD (coronary artery disease), native coronary artery    Left femoral neck fracture This was a result of a mechanical fall. Orthopedics was consulted. Surgery had to wait as the patient was on Xarelto prior to admission. Patient underwent left hemiarthroplasty on 8/13. Continues to have discomfort. Orthopedics is following. Pain control. Nausea, vomiting was likely due to pain medications. Seems to be better today. PT and OT evaluation. Back on Xarelto. Patient is medically stable at this time.  History of severe persistent asthma Stable currently. Continue her medications. Incentive spirometry.   History of chronic diastolic CHF/pedal edema Echocardiogram does not show any worsening heart failure. Grade 1 diastolic dysfunction noted. Reason for her  peripheral edema is not entirely clear. She was given 1 dose of IV Lasix on the day of admission. Lower extremity Doppler studies negative for DVT. Continue gentle hydration for another 24 hours. Holding her Lasix.  Hyponatremia Possibly due to volume depletion. Sodium level stable. Repeat tomorrow. Continue normal saline.  History of lower extremity DVT. Last episode of DVT was 2 years ago. Continues to be an anticoagulation with Xarelto. This was held for surgery. No DVT detected on lower extremity Doppler studies. Xarelto has been resumed.  Chronic kidney disease stage III/hypomagnesemia Seems to be at baseline. Continue to monitor. Replace magnesium. Repeat labs tomorrow.  History of essential hypertension. Blood pressure is reasonably well controlled. Holding her ARB for now.  Postoperative nausea and vomiting. Also due to narcotics. Changing pain regimen. Using when necessary antiemetics  DVT Prophylaxis: SCDs    Code Status: Full code  Family Communication: Discussed with patient  Disposition Plan: management as outlined above. PT and OT evaluation pending. Disposition unknown at this time.    LOS: 5 days   Author:  Berle Mull, MD [facility] Pager: [phone] 12/24/2016 5:59 PM     If 7PM-7AM, please contact night-coverage at [email or web address], password [facility]

## 2016-12-24 NOTE — Progress Notes (Signed)
Physical Therapy Treatment Patient Details Name: [name] MRN: [id number] DOB: 1932/09/22 Today's Date: 12/24/2016    History of Present Illness This 83 year female was admitted after a fall in which she sustained a L displaced femoral neck fx. She is s/p posterior approach THA.  PMH:  asthma, CHF, HTN, breast CA, and cardiac cath    PT Comments    The patient requires 2 assist to stand and  from lower surface and feeling fatigued after sitting up several hours.  Patient and niece report that the patient will only have spouse for assistance iif DC'd to home. The patient is very deconditioned from  Being bedrest x multiple days prior to surgery. Currently has not tolerated  More than transfers. Continues to feel nausea with mobility. Recommend Rehab at DC.    Follow Up Recommendations  SNF;Supervision/Assistance - 24 hour     Equipment Recommendations  None recommended by PT    Recommendations for Other Services       Precautions / Restrictions Precautions Precautions: Posterior Hip;Fall Restrictions Weight Bearing Restrictions: No LLE Weight Bearing: Weight bearing as tolerated    Mobility  Bed Mobility Overal bed mobility: Needs Assistance Bed Mobility: Sit to Supine     Supine to sit: Mod assist Sit to supine: Max assist;+2 for physical assistance;+2 for safety/equipment   General bed mobility comments: assist with the left leg and trunk to return to supine  Transfers Overall transfer level: Needs assistance Equipment used: Rolling walker (2 wheeled) Transfers: Sit to/from [facility] Sit to Stand: Max assist;+2 safety/equipment;+2 physical assistance Stand pivot transfers: Mod assist       General transfer comment: requires assist of 2 to rise from low surface. Able to turn and back up to bed with cues. Limited weight noted on the left leg.  Ambulation/Gait                 Stairs            Wheelchair Mobility    Modified  [place] (Stroke Patients Only)       Balance                                            Cognition Arousal/Alertness: Awake/alert Behavior During Therapy: WFL for tasks assessed/performed Overall Cognitive Status: Within Functional Limits for tasks assessed                                        Exercises      General Comments        Pertinent Vitals/Pain Faces Pain Scale: Hurts even more Pain Location: Left hip Pain Descriptors / Indicators: Discomfort;Grimacing;Guarding;Tightness;Aching Pain Intervention(s): Monitored during session;Premedicated before session;Repositioned;Ice applied    Home Living                      Prior Function            PT Goals (current goals can now be found in the care plan section) Acute Rehab PT Goals Patient Stated Goal: return to independence Progress towards PT goals: Progressing toward goals    Frequency    Min 5X/week (if must DC to home vs. SNF)      PT Plan  (The patient will have limited support other  than elderly spouse. )    Co-evaluation              AM-PAC PT "6 Clicks" Daily Activity  Outcome Measure  Difficulty turning over in bed (including adjusting bedclothes, sheets and blankets)?: Total Difficulty moving from lying on back to sitting on the side of the bed? : Total Difficulty sitting down on and standing up from a chair with arms (e.g., wheelchair, bedside commode, etc,.)?: Total Help needed moving to and from a bed to chair (including a wheelchair)?: Total Help needed walking in hospital room?: Total Help needed climbing 3-5 steps with a railing? : Total 6 Click Score: 6    End of Session Equipment Utilized During Treatment: Gait belt Activity Tolerance: Patient limited by pain;Patient limited by fatigue Patient left: in bed;with call bell/phone within reach;with family/visitor present Nurse Communication: Mobility status PT Visit Diagnosis: Difficulty  in walking, not elsewhere classified (R26.2);History of falling (Z91.81)     Time: 2446-2863 PT Time Calculation (min) (ACUTE ONLY): 10 min  Charges:  $Therapeutic Activity: 8-22 mins                    G CodesTresa Endo PT [phone]    [name] 12/24/2016, 3:25 PM

## 2016-12-24 NOTE — Progress Notes (Signed)
Physical Therapy Treatment Patient Details Name: [name] MRN: [id number] DOB: January 29, 1933 Today's Date: 12/24/2016    History of Present Illness This 83 year female was admitted after a fall in which she sustained a L displaced femoral neck fx. She is s/p posterior approach THA.  PMH:  asthma, CHF, HTN, breast CA, and cardiac cath    PT Comments    Assisted with there exercises for both legs. Assisted to position patient in sidelying with 2 pillows between legs, reviewed posterior precautions. Recommend SNF due to limited caregiver support.    Follow Up Recommendations  SNF;Supervision/Assistance - 24 hour     Equipment Recommendations  None recommended by PT    Recommendations for Other Services       Precautions / Restrictions Precautions Precautions: Posterior Hip;Fall Restrictions Weight Bearing Restrictions: No LLE Weight Bearing: Weight bearing as tolerated    Mobility   Ambulation/Gait                 Stairs            Wheelchair Mobility    Modified Rankin (Stroke Patients Only)       Balance                                            Cognition Arousal/Alertness: Awake/alert Behavior During Therapy: WFL for tasks assessed/performed Overall Cognitive Status: Within Functional Limits for tasks assessed                                        Exercises Total Joint Exercises Ankle Circles/Pumps: AROM;Both;10 reps;Supine Short Arc Quad: AROM;Both;10 reps;Supine Heel Slides: AAROM;AROM;Both;10 reps;Supine Hip ABduction/ADduction: AAROM;AROM;Both;10 reps;Supine    General Comments        Pertinent Vitals/Pain Faces Pain Scale: Hurts little more Pain Location: Left hip Pain Descriptors / Indicators: Discomfort;Tightness;Aching Pain Intervention(s): Monitored during session;Premedicated before session;Repositioned    Home Living                      Prior Function            PT  Goals (current goals can now be found in the care plan section) Acute Rehab PT Goals Patient Stated Goal: return to independence Progress towards PT goals: Progressing toward goals    Frequency    Min 5X/week      PT Plan Current plan remains appropriate    Co-evaluation              AM-PAC PT "6 Clicks" Daily Activity  Outcome Measure  Difficulty turning over in bed (including adjusting bedclothes, sheets and blankets)?: Total Difficulty moving from lying on back to sitting on the side of the bed? : Total Difficulty sitting down on and standing up from a chair with arms (e.g., wheelchair, bedside commode, etc,.)?: Total Help needed moving to and from a bed to chair (including a wheelchair)?: Total Help needed walking in hospital room?: Total Help needed climbing 3-5 steps with a railing? : Total 6 Click Score: 6    End of Session Equipment Utilized During Treatment: Gait belt Activity Tolerance: Patient tolerated treatment well Patient left: in bed;with call bell/phone within reach;with family/visitor present Nurse Communication: Mobility status PT Visit Diagnosis: Difficulty in walking, not elsewhere classified (R26.2);History of falling (  Z91.81)     Time: 1355-1430 PT Time Calculation (min) (ACUTE ONLY): 35 min  Charges:  $Therapeuticexercise: 8-22 mins $Self Care/Home Management: 8-22                    G CodesTresa Endo PT [phone] }   [name] 12/24/2016, 3:31 PM

## 2016-12-24 NOTE — Progress Notes (Signed)
Patient ID: [name], female   DOB: May 22, 1932, 83 y.o.   MRN: [id number] Subjective: 2 Days Post-Op Procedure(s) (LRB): HEMIARTHROPLASTY, LEFT (Left)    Patient reports pain as mild to moderate.  Not much therapy yesterday due to nausea.  But did sit up in a chair for a while.  Husband and daughter at bedside this am  Objective:   VITALS:   Vitals:   12/23/16 2052 12/24/16 0621  BP: (!) 104/44 (!) 111/41  Pulse: 76 78  Resp: 18 18  Temp: 97.9 F (36.6 C) (!) 97.5 F (36.4 C)  SpO2: 100% 95%    Neurovascular intact Incision: dressing C/D/I  LABS  Recent Labs  12/23/16 0548  HGB 10.4*  HCT 31.0*  WBC 7.4  PLT 233     Recent Labs  12/22/16 0641 12/23/16 0548  NA 130* 130*  K 4.3 4.3  BUN 19 19  CREATININE 1.28* 1.22*  GLUCOSE 110* 126*     Recent Labs  12/22/16 0840  INR 1.18     Assessment/Plan: 2 Days Post-Op Procedure(s) (LRB): HEMIARTHROPLASTY, LEFT (Left)   Advance diet Up with therapy Plan for discharge tomorrow Discharge home with home health   Still fell best for patient to be discharged to home.  So continue to try in house PT.  Reviewed initial goals and thoughts about home discharge with family. Due to PONV yesterday may need PT today and tomorrow to progress safely for home

## 2016-12-24 NOTE — Progress Notes (Signed)
Per Rehab admission coordinator patient is included with the orthopedic bundle and will go home w/ home heath services.   Kathrin Greathouse, Latanya Presser, MSW Clinical Social Worker 5E and Psychiatric Service Line [phone] 12/24/2016  9:22 AM

## 2016-12-25 ENCOUNTER — Inpatient Hospital Stay (HOSPITAL_COMMUNITY): Payer: Medicare Other

## 2016-12-25 ENCOUNTER — Ambulatory Visit: Payer: Medicare Other

## 2016-12-25 LAB — CBC
HEMATOCRIT: 27.5 % — AB (ref 36.0–46.0)
Hemoglobin: 9.2 g/dL — ABNORMAL LOW (ref 12.0–15.0)
MCH: 30.3 pg (ref 26.0–34.0)
MCHC: 33.5 g/dL (ref 30.0–36.0)
MCV: 90.5 fL (ref 78.0–100.0)
Platelets: 250 10*3/uL (ref 150–400)
RBC: 3.04 MIL/uL — ABNORMAL LOW (ref 3.87–5.11)
RDW: 13 % (ref 11.5–15.5)
WBC: 6.9 10*3/uL (ref 4.0–10.5)

## 2016-12-25 LAB — BASIC METABOLIC PANEL
ANION GAP: 7 (ref 5–15)
BUN: 18 mg/dL (ref 6–20)
CHLORIDE: 97 mmol/L — AB (ref 101–111)
CO2: 28 mmol/L (ref 22–32)
Calcium: 8.4 mg/dL — ABNORMAL LOW (ref 8.9–10.3)
Creatinine, Ser: 1.17 mg/dL — ABNORMAL HIGH (ref 0.44–1.00)
GFR calc Af Amer: 49 mL/min — ABNORMAL LOW (ref >60)
GFR, EST NON AFRICAN AMERICAN: 43 mL/min — AB (ref >60)
GLUCOSE: 109 mg/dL — AB (ref 65–99)
POTASSIUM: 4.5 mmol/L (ref 3.5–5.1)
Sodium: 132 mmol/L — ABNORMAL LOW (ref 135–145)

## 2016-12-25 LAB — MAGNESIUM: Magnesium: 2 mg/dL (ref 1.7–2.4)

## 2016-12-25 MED ORDER — FUROSEMIDE 20 MG PO TABS
20.0000 mg | ORAL_TABLET | Freq: Every day | ORAL | 0 refills | Status: DC | PRN
Start: 2016-12-25 — End: 2017-03-19

## 2016-12-25 MED ORDER — OXYCODONE-ACETAMINOPHEN 5-325 MG PO TABS: 1.0000 | ORAL_TABLET | ORAL | Status: DC | PRN

## 2016-12-25 MED ORDER — FERROUS SULFATE 325 (65 FE) MG PO TABS: 325.0000 mg | ORAL_TABLET | Freq: Two times a day (BID) | ORAL | 0 refills | Status: DC

## 2016-12-25 MED ORDER — DOCUSATE SODIUM 100 MG PO CAPS: 100.0000 mg | ORAL_CAPSULE | Freq: Two times a day (BID) | ORAL | 0 refills | Status: DC

## 2016-12-25 MED ORDER — POLYETHYLENE GLYCOL 3350 17 G PO PACK
17.0000 g | PACK | Freq: Every day | ORAL | 0 refills | Status: DC
Start: 2016-12-26 — End: 2016-12-26

## 2016-12-25 MED ORDER — ENSURE ENLIVE PO LIQD: 237.0000 mL | Freq: Two times a day (BID) | ORAL | 12 refills | Status: DC

## 2016-12-25 MED ORDER — MAGNESIUM CITRATE PO SOLN
1.0000 | Freq: Once | ORAL | Status: AC
  Administered 2016-12-25: 1 via ORAL
  Filled 2016-12-25: qty 296

## 2016-12-25 NOTE — Progress Notes (Signed)
Physical Therapy Treatment Patient Details Name: [name] MRN: [id number] DOB: [birth year] Today's Date: 12/25/2016    History of Present Illness This 83 year female was admitted after a fall in which she sustained a L displaced femoral neck fx. She is s/p posterior approach THA.  PMH:  asthma, CHF, HTN, breast CA, and cardiac cath    PT Comments    Pt declined mobility this morning due to nausea and again this afternoon.  She continues to report nausea and states she will receive enema shortly as well.  Pt states she had nightmares last night and contributes these and her nausea to pain meds.  Pt also states her cousin passed away last night here at [facility] on cancer unit and visibly upset.  Pt was agreeable to exercises in supine and attempting mobility tomorrow, hopeful to be feeling better.   Follow Up Recommendations  SNF;Supervision/Assistance - 24 hour     Equipment Recommendations  None recommended by PT    Recommendations for Other Services       Precautions / Restrictions Precautions Precautions: Posterior Hip;Fall Precaution Comments: pt able to verbalize 2/3 posterior hip precautions Restrictions LLE Weight Bearing: Weight bearing as tolerated    Mobility  Bed Mobility                  Transfers                    Ambulation/Gait                 Stairs            Wheelchair Mobility    Modified Rankin (Stroke Patients Only)       Balance                                            Cognition Arousal/Alertness: Awake/alert Behavior During Therapy: WFL for tasks assessed/performed Overall Cognitive Status: Within Functional Limits for tasks assessed                                        Exercises Total Joint Exercises Ankle Circles/Pumps: AROM;Both;10 reps;Supine Quad Sets: AROM;Both;10 reps;Supine Short Arc Quad: AROM;10 reps;Supine;Left Heel Slides: AAROM;10  reps;Supine;Left Hip ABduction/ADduction: AAROM;10 reps;Supine;Left    General Comments        Pertinent Vitals/Pain Pain Assessment: Faces Faces Pain Scale: Hurts little more Pain Location: Left hip Pain Descriptors / Indicators: Discomfort;Sore Pain Intervention(s): Monitored during session;Limited activity within patient's tolerance;Repositioned    Home Living                      Prior Function            PT Goals (current goals can now be found in the care plan section) Progress towards PT goals: Progressing toward goals    Frequency    Min 5X/week      PT Plan Current plan remains appropriate    Co-evaluation              AM-PAC PT "6 Clicks" Daily Activity  Outcome Measure  Difficulty turning over in bed (including adjusting bedclothes, sheets and blankets)?: Total Difficulty moving from lying on back to sitting on the side of the bed? : Total Difficulty sitting  down on and standing up from a chair with arms (e.g., wheelchair, bedside commode, etc,.)?: Total Help needed moving to and from a bed to chair (including a wheelchair)?: Total Help needed walking in hospital room?: Total Help needed climbing 3-5 steps with a railing? : Total 6 Click Score: 6    End of Session   Activity Tolerance: Patient tolerated treatment well Patient left: in bed;with call bell/phone within reach;with family/visitor present   PT Visit Diagnosis: Difficulty in walking, not elsewhere classified (R26.2);History of falling (Z91.81)     Time: [id number] PT Time Calculation (min) (ACUTE ONLY): 15 min  Charges:  $Therapeutic Exercise: 8-22 mins                    G Codes:      Carmelia Bake, PT, DPT 12/25/2016 Pager: [phone]  [name] 12/25/2016, 4:29 PM

## 2016-12-25 NOTE — Progress Notes (Signed)
Soap suds enema given with 5-6 small hard balls of stool noted.

## 2016-12-25 NOTE — Progress Notes (Signed)
[facility] PROGRESS NOTE  Aritha Ferreras TZG:[id number] DOB: 11/24/1932 DOA: 12/19/2016  PCP: Crist Infante, MD  Brief History/Interval Summary: 83 year old Caucasian female with a past medical history of asthma, chronic diastolic CHF, hypertension, remote history of breast cancer, history of DVT for which she continues to be on Xarelto presented after a mechanical fall resulted in left hip fracture. Patient was hospitalized for further management.  Reason for Visit: Left hip fracture  Consultants: Orthopedics. Cardiology  Procedures:  Transthoracic echocardiogram Study Conclusions  - Left ventricle: The cavity size was normal. Wall thickness was   increased increased in a pattern of mild to moderate LVH.   Systolic function was normal. The estimated ejection fraction was   in the range of 55% to 60%. Wall motion was normal; there were no   regional wall motion abnormalities. Doppler parameters are   consistent with abnormal left ventricular relaxation (grade 1   diastolic dysfunction). - Ventricular septum: Septal motion showed mild dyssynergy. These   changes are consistent with a left bundle branch block. - Mitral valve: Calcified annulus.  Left hip hemiarthroplasty 8/13  Antibiotics: None  Subjective/Interval History: Nausea resolved, feels bloated and constipation. No abdominal pain  ROS: No headaches.  Objective:  Vital Signs  Vitals:   12/24/16 1533 12/24/16 2011 12/25/16 0359 12/25/16 1436  BP: (!) 130/58 130/77 (!) 120/45 (!) 154/54  Pulse: 88 91 74 93  Resp: 20 18 20 18   Temp: 98 F (36.7 C) (!) 97.5 F (36.4 C) 97.9 F (36.6 C) 97.8 F (36.6 C)  TempSrc: Oral Oral Oral Oral  SpO2: 97% 95% 99% 96%  Weight:      Height:       No intake or output data in the 24 hours ending 12/25/16 1543 Filed Weights   12/19/16 1604  Weight: 68.9 kg (152 lb)    General appearance: Awake and alert. In no distress. Seems to be in some discomfort. Resp:  Clear to auscultation bilaterally. No wheezes, rales or rhonchi. Cardio: S1, S2 normal, regular. S3, S4. No rubs, murmurs or bruits GI: Abdomen is soft. Nontender. Nondistended. Bowel sounds are present. No masses or organomegaly Extremities: No bruising noted over the left thigh area. Neurologic: No focal deficits  Lab Results:  Data Reviewed: I have personally reviewed following labs and imaging studies  CBC:  Recent Labs Lab 12/19/16 1650 12/20/16 0600 12/23/16 0548 12/24/16 0931 12/25/16 0805  WBC 8.7 7.9 7.4 7.2 6.9  NEUTROABS 6.7  --   --   --   --   HGB 12.3 11.8* 10.4* 9.8* 9.2*  HCT 37.5 36.0 31.0* 29.1* 27.5*  MCV 91.2 90.5 91.4 89.0 90.5  PLT 284 219 233 232 759    Basic Metabolic Panel:  Recent Labs Lab 12/20/16 0600 12/22/16 0641 12/23/16 0548 12/24/16 0726 12/25/16 0805  NA 136 130* 130* 128* 132*  K 3.7 4.3 4.3 4.3 4.5  CL 99* 95* 95* 95* 97*  CO2 27 27 26 25 28   GLUCOSE 115* 110* 126* 122* 109*  BUN 15 19 19  21* 18  CREATININE 1.24* 1.28* 1.22* 1.20* 1.17*  CALCIUM 8.8* 8.5* 8.1* 8.1* 8.4*  MG  --   --  1.6* 1.9 2.0    GFR: Estimated Creatinine Clearance: 35.1 mL/min (A) (by C-G formula based on SCr of 1.17 mg/dL (H)).  Coagulation Profile:  Recent Labs Lab 12/19/16 1650 12/22/16 0840  INR 1.98 1.18     Radiology Studies: Dg Humerus Left  Result Date: 12/24/2016 CLINICAL  DATA:  Fell 5 days ago, distal LEFT humeral pain for 1 day EXAM: LEFT HUMERUS - 2+ VIEW COMPARISON:  None FINDINGS: Osseous demineralization. Shoulder and elbow joint alignments grossly normal. No acute fracture, dislocation, or bone destruction. IMPRESSION: No acute osseous abnormalities. Electronically Signed   By: Lavonia Dana M.D.   On: 12/24/2016 14:01     Medications:  Scheduled: . docusate sodium  100 mg Oral BID  . feeding supplement (ENSURE ENLIVE)  237 mL Oral BID BM  . ferrous sulfate  325 mg Oral BID WC  . loratadine  10 mg Oral Daily  .  mometasone-formoterol  2 puff Inhalation BID  . montelukast  10 mg Oral QHS  . multivitamin with minerals  1 tablet Oral Daily  . pantoprazole  40 mg Oral BID  . polyethylene glycol  17 g Oral Daily  . pramipexole  0.375 mg Oral QHS  . rivaroxaban  20 mg Oral Daily  . rosuvastatin  5 mg Oral QHS  . senna-docusate  2 tablet Oral BID  . tiotropium  18 mcg Inhalation Daily   Continuous: . methocarbamol (ROBAXIN)  IV     ENI:DPOEUMPNTIRWE **OR** acetaminophen, albuterol, alum & mag hydroxide-simeth, benzonatate, bisacodyl, HYDROcodone-acetaminophen, HYDROmorphone (DILAUDID) injection, menthol-cetylpyridinium **OR** phenol, methocarbamol **OR** methocarbamol (ROBAXIN)  IV, metoCLOPramide **OR** metoCLOPramide (REGLAN) injection, ondansetron **OR** ondansetron (ZOFRAN) IV, oxyCODONE-acetaminophen  Assessment/Plan:  Principal Problem:   Closed left hip fracture (HCC) Active Problems:   Hypertensive heart disease without CHF   Severe persistent asthma   History of DVT of lower extremity   CKD (chronic kidney disease) stage 3, GFR 30-59 ml/min   CAD (coronary artery disease), native coronary artery    Left femoral neck fracture This was a result of a mechanical fall. Orthopedics was consulted. Surgery had to wait as the patient was on Xarelto prior to admission. Patient underwent left hemiarthroplasty on 8/13. Continues to have discomfort. Orthopedics is following. Pain control. Nausea, vomiting was likely due to pain medications. Seems to be better today. PT and OT evaluation. Back on Xarelto. Patient is medically stable at this time.  History of severe persistent asthma Stable currently. Continue her medications. Incentive spirometry.   History of chronic diastolic CHF/pedal edema Echocardiogram does not show any worsening heart failure. Grade 1 diastolic dysfunction noted. Reason for her peripheral edema is not entirely clear. She was given 1 dose of IV Lasix on the day of admission.  Lower extremity Doppler studies negative for DVT. Continue gentle hydration for another 24 hours. Holding her Lasix.  Hyponatremia Possibly due to volume depletion. Sodium level stable. Repeat tomorrow. Continue normal saline.  History of lower extremity DVT. Last episode of DVT was 2 years ago. Continues to be an anticoagulation with Xarelto. This was held for surgery. No DVT detected on lower extremity Doppler studies. Xarelto has been resumed.  Chronic kidney disease stage III/hypomagnesemia Seems to be at baseline. Continue to monitor. Replace magnesium. Repeat labs tomorrow.  History of essential hypertension. Blood pressure is reasonably well controlled. Holding her ARB for now.  Postoperative nausea and vomiting. Also due to narcotics. Changing pain regimen. Using when necessary antiemetics  Constipation Check x ray abdominal  Soap suds enema Milk of magnesia  Continue bowel regimen   DVT Prophylaxis: SCDs    Code Status: Full code  Family Communication: Discussed with patient  Disposition Plan: management as outlined above. PT and OT evaluation pending. Disposition unknown at this time.    LOS: 6 days   Author:  Berle Mull, MD [facility] Pager: [phone] 12/25/2016 3:43 PM     If 7PM-7AM, please contact night-coverage at [email or web address], password [facility]

## 2016-12-25 NOTE — Progress Notes (Signed)
CSW assisting with patient placement to SNF-[facility]. CSW reached out to [place] the case manager to help coordinate patient discharge. CSW faxed Therapy notes and completed FL2.

## 2016-12-25 NOTE — Progress Notes (Addendum)
Patient ID: [name], female   DOB: [birth year], 83 y.o.   MRN: [id number] Subjective: 3 Days Post-Op Procedure(s) (LRB): HEMIARTHROPLASTY, LEFT (Left)    Patient reports pain as moderate.  Apparently failing therapy at this point, plus family support waning.  No events.  Weak with activity requiring 2 people to stand ( a bit odd as functioning prior to fall).  Hoping to have her return to pre-injury level of function  Objective:   VITALS:   Vitals:   12/24/16 2011 12/25/16 0359  BP: 130/77 (!) 120/45  Pulse: 91 74  Resp: 18 20  Temp: (!) 97.5 F (36.4 C) 97.9 F (36.6 C)  SpO2: 95% 99%    Neurovascular intact Incision: dressing C/D/I  LABS  Recent Labs  12/23/16 0548 12/24/16 0931 12/25/16 0805  HGB 10.4* 9.8* 9.2*  HCT 31.0* 29.1* 27.5*  WBC 7.4 7.2 6.9  PLT 233 232 250     Recent Labs  12/23/16 0548 12/24/16 0726 12/25/16 0805  NA 130* 128* 132*  K 4.3 4.3 4.5  BUN 19 21* 18  CREATININE 1.22* 1.20* 1.17*  GLUCOSE 126* 122* 109*    No results for input(s): LABPT, INR in the last 72 hours.   Assessment/Plan: 3 Days Post-Op Procedure(s) (LRB): HEMIARTHROPLASTY, LEFT (Left)   Up with therapy Discharge to SNF I guess at this point. I have expressed my concerns about SNF and her trying to arrange for increased family support for short term but this apparently is not possible Hope to consider [facility] as they are aware of our concerns and protocols for goals, care and length of stay.  Again I anticipate that she will continue to progress well and return to prior function  Xarelto for DVT  WBAT LLE RTC in 2 weeks Pain meds Rx on chart

## 2016-12-25 NOTE — Progress Notes (Addendum)
All clinical documentation has been sent to SNF.  Per nurse patient has not yet had BM. Physician has been notified. CSW updated facility, patient may or may not discharge.  If patient does not d/c, nurse please call SNF RN at [phone].    [address] [place],  [phone]

## 2016-12-25 NOTE — Clinical Social Work Placement (Signed)
   CLINICAL SOCIAL WORK PLACEMENT  NOTE  Date:  12/25/2016  Patient Details  Name: [name] MRN: [id number] Date of Birth: [birth year]  Clinical Social Work is seeking post-discharge placement for this patient at the [place] level of care (*CSW will initial, date and re-position this form in  chart as items are completed):      Patient/family provided with [facility] Work Department's list of facilities offering this level of care within the geographic area requested by the patient (or if unable, by the patient's family).      Patient/family informed of their freedom to choose among providers that offer the needed level of care, that participate in Medicare, Medicaid or managed care program needed by the patient, have an available bed and are willing to accept the patient.      Patient/family informed of [facility]'s ownership interest in [facility] and [facility], as well as of the fact that they are under no obligation to receive care at these facilities.  PASRR submitted to EDS on 12/23/16     PASRR number received on 12/23/16     Existing PASRR number confirmed on       FL2 transmitted to all facilities in geographic area requested by pt/family on       FL2 transmitted to all facilities within larger geographic area on 12/25/16     Patient informed that his/her managed care company has contracts with or will negotiate with certain facilities, including the following:        Yes   Patient/family informed of bed offers received.  [facility] recommends and patient chooses bed at      Patient to be transferred to [facility] on 12/25/16.  Patient to be transferred to facility by [facility]     Patient family notified on 12/25/16 of transfer.  Name of family member notified:        PHYSICIAN Please prepare priority discharge summary, including medications      Additional Comment:    _______________________________________________ Lia Hopping, LCSW 12/25/2016, 1:22 PM

## 2016-12-25 NOTE — Discharge Summary (Signed)
[facility] Discharge Summary   Patient: [name] HEN:[id number]   PCP: Crist Infante, MD DOB: 16-Aug-1932   Date of admission: 12/19/2016   Date of discharge:  12/25/2016    Discharge Diagnoses:  Principal Problem:   Closed left hip fracture ([name]) Active Problems:   Hypertensive heart disease without CHF   Severe persistent asthma   History of DVT of lower extremity   CKD (chronic kidney disease) stage 3, GFR 30-59 ml/min   CAD (coronary artery disease), native coronary artery   Admitted From: home Disposition:  SNF  Recommendations for Outpatient Follow-up:  1. Follow-up with PCP in one week and orthopedic as recommended    Contact information for follow-up providers    Paralee Cancel, MD Follow up in 2 week(s).   Specialty:  Orthopedic Surgery Why:  wound check, check post op status Contact information: [address] [place] [address] [phone]        Paralee Cancel, MD Follow up.   Specialty:  Orthopedic Surgery Contact information: [address] [place]. [address] [place] [address]        Crist Infante, MD. Schedule an appointment as soon as possible for a visit in 1 week(s).   Specialty:  Internal Medicine Contact information: [address] [place]  [address] [phone]            Contact information for after-discharge care    Destination    [facility] SNF[facility]ALF Follow up.   Specialty:  Skilled Nursing Facility Contact information: [address] [place] [phone]                 Diet recommendation: Cardiac diet  Activity: The patient is advised to gradually reintroduce usual activities.  Discharge Condition: good  Code Status: Full code  History of present illness: As per the H and P dictated on admission, "[name] is a 83 y.o. female with a past medical history of asthma, chronic diastolic CHF, hypertension, remote history of breast  cancer status post mastectomy, history of cardiac catheterization in 2008, with normal coronary arteries, low risk stress test in 2016, history of DVT about 2 years ago for which she continues to be on Xarelto and who was in her usual state of health until this afternoon when she was walking back into her house. She missed a step and fell and could not get up. She had pain in her left hip. She denies any passing out episodes. No chest pain, shortness of breath, either prior to or after the fall. No dizziness or lightheadedness. At baseline, the most she walks is to the mailbox and back. Does not climb stairs. Does not get any chest pain or shortness of breath with these activities. However, patient does have severe persistent asthma. She does mention that over the last 3 weeks she has noticed more leg swelling in both her lower extremities. She takes Lasix which seems to help, but has not been helping for the last 2-3 weeks.  In the emergency department. Patient underwent workup which revealed left femoral neck fracture. Patient will need hospitalization for further management."  Hospital Course:  Summary of her active problems in the hospital is as following.  Left femoral neck fracture This was a result of a mechanical fall.  Orthopedics was consulted.  Surgery had to wait as the patient was on Xarelto prior to admission.  Patient underwent left hemiarthroplasty on 8/13.  Postop Continues to have discomfort.  Weightbearing as  tolerated per orthopedics. Discharging to SNF.  History of severe persistent asthma Stable currently. Continue her medications. Incentive spirometry.   History of chronic diastolic CHF/pedal edema Echocardiogram does not show any worsening heart failure. Grade 1 diastolic dysfunction noted. Reason for her peripheral edema is not entirely clear. She was given 1 dose of IV Lasix on the day of admission. Lower extremity Doppler studies negative for DVT. Changing Lasix from  schedule to as needed.  Hyponatremia Possibly due to volume depletion. Sodium level stable.   History of lower extremity DVT. Last episode of DVT was 2 years ago. Continues to be an anticoagulation with Xarelto. This was held for surgery. No DVT detected on lower extremity Doppler studies. Xarelto has been resumed.  Chronic kidney disease stage III/hypomagnesemia Seems to be at baseline. Continue to monitor.   History of essential hypertension. Blood pressure is reasonably well controlled. Resuming home medications  Postoperative nausea and vomiting. Also due to narcotics. Continue pain regimen. Using when necessary antiemetics   All other chronic medical condition were stable during the hospitalization.  Patient was seen by physical therapy, who recommended SNF, which was arranged by Education officer, museum and case Freight forwarder. On the day of the discharge the patient's vitals were stable, and no other acute medical condition were reported by patient. the patient was felt safe to be discharge at SNF with therapy.  Procedures and Results:  Transthoracic echocardiogram Study Conclusions  - Left ventricle: The cavity size was normal. Wall thickness was increased increased in a pattern of mild to moderate LVH. Systolic function was normal. The estimated ejection fraction was in the range of 55% to 60%. Wall motion was normal; there were no regional wall motion abnormalities. Doppler parameters are consistent with abnormal left ventricular relaxation (grade 1 diastolic dysfunction). - Ventricular septum: Septal motion showed mild dyssynergy. These changes are consistent with a left bundle branch block. - Mitral valve: Calcified annulus.   Left hip hemiarthroplasty 8/13  Consultations:  Orthopedics  DISCHARGE MEDICATION: Current Discharge Medication List    START taking these medications   Details  docusate sodium (COLACE) 100 MG capsule Take 1 capsule (100 mg  total) by mouth 2 (two) times daily. Qty: 10 capsule, Refills: 0    feeding supplement, ENSURE ENLIVE, (ENSURE ENLIVE) LIQD Take 237 mLs by mouth 2 (two) times daily between meals. Qty: 237 mL, Refills: 12    ferrous sulfate 325 (65 FE) MG tablet Take 1 tablet (325 mg total) by mouth 2 (two) times daily with a meal. Qty: 60 tablet, Refills: 0    HYDROcodone-acetaminophen (NORCO/VICODIN) 5-325 MG tablet Take 1-2 tablets by mouth every 6 (six) hours as needed for moderate pain. Qty: 40 tablet, Refills: 0    methocarbamol (ROBAXIN) 500 MG tablet Take 1 tablet (500 mg total) by mouth every 6 (six) hours as needed for muscle spasms. Qty: 60 tablet, Refills: 0    polyethylene glycol (MIRALAX / GLYCOLAX) packet Take 17 g by mouth daily. Qty: 14 each, Refills: 0      CONTINUE these medications which have CHANGED   Details  furosemide (LASIX) 20 MG tablet Take 1 tablet (20 mg total) by mouth daily as needed for fluid or edema. Qty: 30 tablet, Refills: 0      CONTINUE these medications which have NOT CHANGED   Details  acetaminophen (TYLENOL) 500 MG tablet Take 500 mg by mouth every 6 (six) hours as needed for mild pain or moderate pain.    albuterol (PROAIR HFA) 108 (90  Base) MCG/ACT inhaler Inhale 2 puffs into the lungs every 6 (six) hours as needed for wheezing or shortness of breath. Qty: 18 g, Refills: 5    albuterol (PROVENTIL) (2.5 MG/3ML) 0.083% nebulizer solution Take 3 mLs (2.5 mg total) by nebulization every 6 (six) hours as needed for wheezing or shortness of breath. Dx: J45.41 Qty: 360 mL, Refills: 6    benzonatate (TESSALON) 100 MG capsule TAKE 1 CAPSULE (100 MG TOTAL) BY MOUTH 3 (THREE) TIMES DAILY AS NEEDED FOR COUGH. Qty: 100 capsule, Refills: 1    budesonide-formoterol (SYMBICORT) 160-4.5 MCG/ACT inhaler INHALE 2 PUFFS INTO THE LUNGS TWICE A DAY Qty: 3 Inhaler, Refills: 3    Calcium Carbonate-Vitamin D (CALTRATE 600+D) 600-400 MG-UNIT per tablet Take 1 tablet by mouth  daily.     chlorpheniramine (CHLOR-TRIMETON) 4 MG tablet Take by mouth. 4 mg in the morning and 8 mg at bedtime    Cholecalciferol (VITAMIN D3) 1000 UNITS tablet Take 1,000 Units by mouth daily.      dextromethorphan (DELSYM) 30 MG/5ML liquid Take 60 mg by mouth 2 (two) times daily as needed for cough.    esomeprazole (NEXIUM) 40 MG capsule Take 1 capsule (40 mg total) by mouth 2 (two) times daily. Name brand only Qty: 180 capsule, Refills: 3    fexofenadine (ALLEGRA) 180 MG tablet Take 180 mg by mouth daily.      mometasone (NASONEX) 50 MCG/ACT nasal spray USE 2 SPRAYS NASALLY once daily Qty: 51 g, Refills: 6    montelukast (SINGULAIR) 10 MG tablet Take 1 tablet (10 mg total) by mouth at bedtime. Qty: 30 tablet, Refills: 5    Multiple Vitamin (MULTIVITAMIN) tablet Take 1 tablet by mouth daily.    pramipexole (MIRAPEX) 0.125 MG tablet Take 0.375 mg by mouth at bedtime.     rosuvastatin (CRESTOR) 5 MG tablet Take 1 tablet (5 mg total) by mouth at bedtime. Qty: 90 tablet, Refills: 2    Spacer/Aero-Holding Chambers (AEROCHAMBER MV) inhaler by Other route. Use as instructed     telmisartan (MICARDIS) 20 MG tablet TAKE 1 TABLET AT BEDTIME Qty: 90 tablet, Refills: 3    Tiotropium Bromide Monohydrate (SPIRIVA RESPIMAT) 2.5 MCG/ACT AERS Inhale 2 puffs into the lungs daily. Qty: 3 Inhaler, Refills: 3    vitamin B-12 (CYANOCOBALAMIN) 500 MCG tablet Take 500 mcg by mouth daily.      XARELTO 20 MG TABS tablet Take 20 mg by mouth daily.       STOP taking these medications     potassium chloride (K-DUR) 10 MEQ tablet      predniSONE (DELTASONE) 10 MG tablet        Allergies  Allergen Reactions  . Eggs Or Egg-Derived Products     RAW EGGS.. Can eat cooked eggs  . Influenza Vaccines   . Nucala [Mepolizumab]   . Cephalosporins Rash  . Codeine Rash  . Levofloxacin Rash  . Pneumococcal Vaccines Rash   Discharge Instructions    Diet - low sodium heart healthy    Complete by:   As directed    Discharge instructions    Complete by:  As directed    It is important that you read following instructions as well as go over your medication list with RN to help you understand your care after this hospitalization.  Discharge Instructions: Please follow-up with PCP in one week  Please request your primary care physician to go over all Hospital Tests and Procedure/Radiological results at the follow up,  Please  get all Hospital records sent to your PCP by signing hospital release before you go home.   Do not take more than prescribed Pain, Sleep and Anxiety Medications. You were cared for by a hospitalist during your hospital stay. If you have any questions about your discharge medications or the care you received while you were in the hospital after you are discharged, you can call the unit and ask to speak with the hospitalist on call if the hospitalist that took care of you is not available.  Once you are discharged, your primary care physician will handle any further medical issues. Please note that NO REFILLS for any discharge medications will be authorized once you are discharged, as it is imperative that you return to your primary care physician (or establish a relationship with a primary care physician if you do not have one) for your aftercare needs so that they can reassess your need for medications and monitor your lab values. You Must read complete instructions/literature along with all the possible adverse reactions/side effects for all the Medicines you take and that have been prescribed to you. Take any new Medicines after you have completely understood and accept all the possible adverse reactions/side effects. Wear Seat belts while driving. If you have smoked or chewed Tobacco in the last 2 yrs please stop smoking and/or stop any Recreational drug use.   Increase activity slowly    Complete by:  As directed    Weight bearing as tolerated    Complete by:  As  directed      Discharge Exam: Filed Weights   12/19/16 1604  Weight: 68.9 kg (152 lb)   Vitals:   12/24/16 2011 12/25/16 0359  BP: 130/77 (!) 120/45  Pulse: 91 74  Resp: 18 20  Temp: (!) 97.5 F (36.4 C) 97.9 F (36.6 C)  SpO2: 95% 99%   General: Appear in mild distress, no Rash; Oral Mucosa moist. Cardiovascular: S1 and S2 Present, no Murmur, no JVD Respiratory: Bilateral Air entry present and Clear to Auscultation, no Crackles, no wheezes Abdomen: Bowel Sound present, Soft and no tenderness Extremities: no Pedal edema, no calf tenderness Neurology: Grossly no focal neuro deficit.  The results of significant diagnostics from this hospitalization (including imaging, microbiology, ancillary and laboratory) are listed below for reference.    Significant Diagnostic Studies: Dg Chest 1 View  Result Date: 12/19/2016 CLINICAL DATA:  Pain following fall EXAM: CHEST 1 VIEW COMPARISON:  August 17, 2014 FINDINGS: There is no edema or consolidation. The heart size and pulmonary vascularity are normal. No adenopathy. No pneumothorax. No fracture evident. IMPRESSION: No edema or consolidation. Electronically Signed   By: Lowella Grip III M.D.   On: 12/19/2016 16:38   Pelvis Portable  Result Date: 12/22/2016 CLINICAL DATA:  Hip replacement . EXAM: PORTABLE PELVIS 1-2 VIEWS COMPARISON:  CT 06/26/2009 . FINDINGS: Left hip replacement. Anatomic alignment on AP view. Hardware intact. No acute abnormality. IMPRESSION: Left hip replacement.  Anatomic alignment on AP view. Electronically Signed   By: Marcello Moores  Register   On: 12/22/2016 15:53   Dg Humerus Left  Result Date: 12/24/2016 CLINICAL DATA:  [place] 5 days ago, distal LEFT humeral pain for 1 day EXAM: LEFT HUMERUS - 2+ VIEW COMPARISON:  None FINDINGS: Osseous demineralization. Shoulder and elbow joint alignments grossly normal. No acute fracture, dislocation, or bone destruction. IMPRESSION: No acute osseous abnormalities. Electronically  Signed   By: Lavonia Dana M.D.   On: 12/24/2016 14:01   Dg Hip Unilat  With Pelvis 2-3 Views Left  Result Date: 12/19/2016 CLINICAL DATA:  Pain following fall EXAM: DG HIP (WITH OR WITHOUT PELVIS) 2-3V LEFT COMPARISON:  None. FINDINGS: Frontal pelvis as well as frontal and lateral left hip images were obtained. There is an impacted fracture in the subcapital femoral neck region on the left. No other fracture. No dislocation. There is slight symmetric narrowing of both hip joints. There is mild osteoarthritic change in each sacroiliac joint. IMPRESSION: Impacted left subcapital femoral neck fracture. No other fracture. No dislocation. Mild narrowing of both hip joints as well as mild osteoarthritic change in each sacroiliac joint. Electronically Signed   By: Lowella Grip III M.D.   On: 12/19/2016 16:37    Microbiology: Recent Results (from the past 240 hour(s))  Surgical PCR screen     Status: None   Collection Time: 12/22/16  1:01 AM  Result Value Ref Range Status   MRSA, PCR NEGATIVE NEGATIVE Final   Staphylococcus aureus NEGATIVE NEGATIVE Final    Comment:        The Xpert SA Assay (FDA approved for NASAL specimens in patients over 71 years of age), is one component of a comprehensive surveillance program.  Test performance has been validated by [facility] for patients greater than or equal to 66 year old. It is not intended to diagnose infection nor to guide or monitor treatment.     Labs: CBC:  Recent Labs Lab 12/19/16 1650 12/20/16 0600 12/23/16 0548 12/24/16 0931 12/25/16 0805  WBC 8.7 7.9 7.4 7.2 6.9  NEUTROABS 6.7  --   --   --   --   HGB 12.3 11.8* 10.4* 9.8* 9.2*  HCT 37.5 36.0 31.0* 29.1* 27.5*  MCV 91.2 90.5 91.4 89.0 90.5  PLT 284 219 233 232 735   Basic Metabolic Panel:  Recent Labs Lab 12/20/16 0600 12/22/16 0641 12/23/16 0548 12/24/16 0726 12/25/16 0805  NA 136 130* 130* 128* 132*  K 3.7 4.3 4.3 4.3 4.5  CL 99* 95* 95* 95* 97*  CO2 27 27 26  25 28   GLUCOSE 115* 110* 126* 122* 109*  BUN 15 19 19  21* 18  CREATININE 1.24* 1.28* 1.22* 1.20* 1.17*  CALCIUM 8.8* 8.5* 8.1* 8.1* 8.4*  MG  --   --  1.6* 1.9 2.0   Time spent: 35 minutes  Signed:  Zendayah Hardgrave  [facility]  12/25/2016  , 1:51 PM

## 2016-12-25 NOTE — NC FL2 (Signed)
[place] LEVEL OF CARE SCREENING TOOL     IDENTIFICATION  Patient Name: [name] Birthdate: 12-02-1932 Sex: female Admission Date (Current Location): 12/19/2016  [facility] [place] [facility] and [place]:  Herbalist and Address:  [facility],  [place], [place]      Provider Number: [id number]  Attending Physician Name and Address:  Lavina Hamman, MD  Relative Name and Phone Number:       Current Level of Care: Hospital Recommended Level of Care: [place] Prior Approval Number:    Date Approved/Denied:   PASRR Number: [id number]  Discharge Plan:      Current Diagnoses: Patient Active Problem List   Diagnosis Date Noted  . CAD (coronary artery disease), native coronary artery 12/20/2016  . Closed left hip fracture ([place]) 12/19/2016  . CKD (chronic kidney disease) stage 3, GFR 30-59 ml/min 12/19/2016  . History of DVT of lower extremity   . Lung nodule 08/03/2014  . Peripheral vascular disease, unspecified ([place]) 09/12/2013  . Hyperlipidemia 03/03/2013  . Expected blood loss anemia 10/06/2012  . Overweight (BMI 25.0-29.9) 10/06/2012  . DJD (degenerative joint disease) 05/26/2012  . Severe persistent asthma 08/05/2007  . Hypertensive heart disease without CHF 04/30/2007  . GERD 01/13/2007    Orientation RESPIRATION BLADDER Height & Weight     Self, Time, Situation, Place  Normal Continent Weight: 152 lb (68.9 kg) Height:  5\' 3"  (160 cm)  BEHAVIORAL SYMPTOMS/MOOD NEUROLOGICAL BOWEL NUTRITION STATUS      Continent Diet (Regular)  AMBULATORY STATUS COMMUNICATION OF NEEDS Skin   Extensive Assist Verbally Normal                       Personal Care Assistance Level of Assistance  Bathing, Feeding, Dressing Bathing Assistance: Limited assistance Feeding assistance: Independent Dressing Assistance: Limited assistance     Functional Limitations Info  Sight, Hearing, Speech Sight Info:  Adequate Hearing Info: Adequate Speech Info: Adequate    SPECIAL CARE FACTORS FREQUENCY  PT (By licensed PT), OT (By licensed OT)     PT Frequency: Min 5X/week OT Frequency: Min 5X/week            Contractures Contractures Info: Not present    Additional Factors Info  Code Status, Allergies Code Status Info: Fullcode Allergies Info: Eggs Or Egg-derived Products, Influenza Vaccines, Nucala Mepolizumab, Cephalosporins, Codeine, Levofloxacin, Pneumococcal Vaccines           Current Medications (12/25/2016):  This is the current hospital active medication list Current Facility-Administered Medications  Medication Dose Route Frequency Provider Last Rate Last Dose  . acetaminophen (TYLENOL) tablet 650 mg  650 mg Oral Q6H PRN Paralee Cancel, MD       Or  . acetaminophen (TYLENOL) suppository 650 mg  650 mg Rectal Q6H PRN Paralee Cancel, MD      . albuterol (PROVENTIL) (2.5 MG/3ML) 0.083% nebulizer solution 2.5 mg  2.5 mg Nebulization Q6H PRN Bonnielee Haff, MD      . alum & mag hydroxide-simeth (MAALOX/MYLANTA) 200-200-20 MG/5ML suspension 30 mL  30 mL Oral Q4H PRN Paralee Cancel, MD      . benzonatate (TESSALON) capsule 100 mg  100 mg Oral TID PRN Bonnielee Haff, MD      . bisacodyl (DULCOLAX) suppository 10 mg  10 mg Rectal Daily PRN Lavina Hamman, MD      . docusate sodium (COLACE) capsule 100 mg  100 mg Oral BID Paralee Cancel, MD  100 mg at 12/25/16 0940  . feeding supplement (ENSURE ENLIVE) (ENSURE ENLIVE) liquid 237 mL  237 mL Oral BID BM Bonnielee Haff, MD   237 mL at 12/25/16 0941  . ferrous sulfate tablet 325 mg  325 mg Oral BID WC Paralee Cancel, MD   325 mg at 12/25/16 0941  . HYDROcodone-acetaminophen (NORCO/VICODIN) 5-325 MG per tablet 1-2 tablet  1-2 tablet Oral Q6H PRN Paralee Cancel, MD   2 tablet at 12/25/16 0603  . HYDROmorphone (DILAUDID) injection 0.5-1 mg  0.5-1 mg Intravenous Q3H PRN Paralee Cancel, MD   1 mg at 12/23/16 1244  . loratadine (CLARITIN) tablet 10  mg  10 mg Oral Daily Bonnielee Haff, MD   10 mg at 12/25/16 0940  . menthol-cetylpyridinium (CEPACOL) lozenge 3 mg  1 lozenge Oral PRN Paralee Cancel, MD       Or  . phenol (CHLORASEPTIC) mouth spray 1 spray  1 spray Mouth/Throat PRN Paralee Cancel, MD      . methocarbamol (ROBAXIN) tablet 500 mg  500 mg Oral Q6H PRN Bonnielee Haff, MD   500 mg at 12/25/16 0603   Or  . methocarbamol (ROBAXIN) 500 mg in dextrose 5 % 50 mL IVPB  500 mg Intravenous Q6H PRN Bonnielee Haff, MD      . metoCLOPramide (REGLAN) tablet 5-10 mg  5-10 mg Oral Q8H PRN Paralee Cancel, MD   10 mg at 12/24/16 1730   Or  . metoCLOPramide (REGLAN) injection 5-10 mg  5-10 mg Intravenous Q8H PRN Paralee Cancel, MD   10 mg at 12/24/16 1007  . mometasone-formoterol (DULERA) 200-5 MCG/ACT inhaler 2 puff  2 puff Inhalation BID Bonnielee Haff, MD   2 puff at 12/25/16 [phone]  . montelukast (SINGULAIR) tablet 10 mg  10 mg Oral QHS Bonnielee Haff, MD   10 mg at 12/24/16 2200  . multivitamin with minerals tablet 1 tablet  1 tablet Oral Daily Bonnielee Haff, MD   1 tablet at 12/25/16 0941  . ondansetron (ZOFRAN) tablet 4 mg  4 mg Oral Q6H PRN Paralee Cancel, MD       Or  . ondansetron [facility]) injection 4 mg  4 mg Intravenous Q6H PRN Paralee Cancel, MD   4 mg at 12/25/16 0416  . oxyCODONE (Oxy IR/ROXICODONE) immediate release tablet 5 mg  5 mg Oral Q4H PRN Lavina Hamman, MD   5 mg at 12/25/16 0415  . pantoprazole (PROTONIX) EC tablet 40 mg  40 mg Oral BID Bonnielee Haff, MD   40 mg at 12/25/16 0940  . polyethylene glycol (MIRALAX / GLYCOLAX) packet 17 g  17 g Oral Daily Lavina Hamman, MD   17 g at 12/25/16 0941  . pramipexole (MIRAPEX) tablet 0.375 mg  0.375 mg Oral QHS Bonnielee Haff, MD   0.375 mg at 12/24/16 2140  . rivaroxaban (XARELTO) tablet 20 mg  20 mg Oral Daily Paralee Cancel, MD   20 mg at 12/25/16 0940  . rosuvastatin (CRESTOR) tablet 5 mg  5 mg Oral QHS Bonnielee Haff, MD   5 mg at 12/24/16 2141  . senna-docusate  (Senokot-S) tablet 2 tablet  2 tablet Oral BID Lavina Hamman, MD   2 tablet at 12/25/16 0940  . tiotropium (SPIRIVA) inhalation capsule 18 mcg  18 mcg Inhalation Daily Berton Mount, RPH   18 mcg at 12/25/16 8315     Discharge Medications: Please see discharge summary for a list of discharge medications.  Relevant Imaging Results:  Relevant Lab Results:  Additional Information [id number]  Lia Hopping, LCSW

## 2016-12-25 NOTE — Progress Notes (Signed)
Nutrition Follow-up  DOCUMENTATION CODES:   Not applicable  INTERVENTION:   Ensure Enlive po BID, each supplement provides 350 kcal and 20 grams of protein  MVI daily  NUTRITION DIAGNOSIS:   Increased nutrient needs related to  (hip fracture) as evidenced by estimated needs.  Ongoing  GOAL:   Patient will meet greater than or equal to 90% of their needs  Progressing   MONITOR:   PO intake, Supplement acceptance, Labs, Weight trends, I & O's  REASON FOR ASSESSMENT:   Consult Hip fracture protocol  ASSESSMENT:   83 year old Caucasian female with a past medical history of asthma, chronic diastolic CHF, hypertension, remote history of breast cancer, history of DVT for which she continues to be on Xarelto presented after a mechanical fall resulted in left hip fracture. Patient was hospitalized for further management.  Spoke with family member and pt. Pt states her appetite was great until the development of new nausea and vomiting symptoms yesterday. Family member reports MD is to find an alternative pain medication as that is the possible source of her n/v. Meal completions are spotty, with an average of 35% intake for last 8 meals. Reports tolerating Ensure's yesterday, but today is vomiting immediatly after consumption. Pt noted to be constipated post surgery, bowel regimen on board at this time. Will continue to monitor for PO toleration and BM status.   Medications reviewed and include: ferrous sulfate, MVI, IV abx Labs reviewed: Na 132 (L) Cl 97 (L) CBG 109-126 Creatinine 1.17 (H) Calcium 8.4 (L)  Diet Order:  Diet regular Room service appropriate? Yes; Fluid consistency: Thin  Skin:   (closed incision left hip)  Last BM:  12/19/16  Height:   Ht Readings from Last 1 Encounters:  12/19/16 5\' 3"  (1.6 m)    Weight:   Wt Readings from Last 1 Encounters:  12/19/16 152 lb (68.9 kg)    Ideal Body Weight:  52.3 kg  BMI:  Body mass index is 26.93  kg/m.  Estimated Nutritional Needs:   Kcal:  1500-1700  Protein:  70-80g  Fluid:  1.7L/day  EDUCATION NEEDS:   No education needs identified at this time  [place], LDN Clinical Nutrition Pager # - [phone]

## 2016-12-25 NOTE — Progress Notes (Signed)
Occupational Therapy Treatment Patient Details Name: [name] MRN: [id number] DOB: 04-Aug-1932 Today's Date: 12/25/2016    History of present illness This 83 year female was admitted after a fall in which she sustained a L displaced femoral neck fx. She is s/p posterior approach THA.  PMH:  asthma, CHF, HTN, breast CA, and cardiac cath   OT comments  Pt continues to experience nausea and has decreased activity tolerance. Performed bathing while sitting on 3:1 commode. Brought reacher to use, but pt not up to it this am.  Recommend SNF; pt states husband cannot help her physically  Follow Up Recommendations  SNF    Equipment Recommendations  3 in 1 bedside commode    Recommendations for Other Services      Precautions / Restrictions Precautions Precautions: Posterior Hip;Fall Precaution Booklet Issued: Yes (comment) Restrictions LLE Weight Bearing: Weight bearing as tolerated       Mobility Bed Mobility           Sit to supine: Min assist;HOB elevated   General bed mobility comments: assist for LLE back to bed  Transfers   Equipment used: Rolling walker (2 wheeled)   Sit to Stand: Min assist;From elevated surface Stand pivot transfers: Min assist       General transfer comment: assist to rise and stabilize.  Cues for THPs    Balance                                           ADL either performed or assessed with clinical judgement   ADL               Lower Body Bathing: Maximal assistance;Sit to/from stand;+2 for physical assistance           Toilet Transfer: Minimal assistance;Stand-pivot;BSC;RW   Toileting- Clothing Manipulation and Hygiene: Total assistance;Sit to/from stand         General ADL Comments: Pt still feeling nauseous.  Worked on bathing when sitting on commode.  Pt did not feel she could perform hygiene in standing.  Cues for not bending beyond 90 when sitting.      Vision       Perception      Praxis      Cognition Arousal/Alertness: Awake/alert Behavior During Therapy: WFL for tasks assessed/performed Overall Cognitive Status: Within Functional Limits for tasks assessed                                 General Comments: needs reinforcement with precautions when sitting        Exercises     Shoulder Instructions       General Comments      Pertinent Vitals/ Pain       Faces Pain Scale: Hurts little more Pain Location: Left hip Pain Descriptors / Indicators: Discomfort Pain Intervention(s): Limited activity within patient's tolerance;Monitored during session;Premedicated before session;Repositioned  Home Living                                          Prior Functioning/Environment              Frequency  Min 2X/week        Progress Toward Goals  OT Goals(current  goals can now be found in the care plan section)  Progress towards OT goals: Not progressing toward goals - comment (slow progress)  Acute Rehab OT Goals Patient Stated Goal: return to independence  Plan Discharge plan needs to be updated    Co-evaluation                 AM-PAC PT "6 Clicks" Daily Activity     Outcome Measure   Help from another person eating meals?: None Help from another person taking care of personal grooming?: A Little Help from another person toileting, which includes using toliet, bedpan, or urinal?: A Lot Help from another person bathing (including washing, rinsing, drying)?: A Lot Help from another person to put on and taking off regular upper body clothing?: A Little Help from another person to put on and taking off regular lower body clothing?: A Lot 6 Click Score: 16    End of Session    OT Visit Diagnosis: Pain Pain - Right/Left: Left Pain - part of body: Hip   Activity Tolerance Patient limited by fatigue   Patient Left in bed;with call bell/phone within reach;with family/visitor present;with bed alarm  set   Nurse Communication          Time: 9574-7340 OT Time Calculation (min): 27 min  Charges: OT General Charges $OT Visit: 1 Procedure OT Treatments $Self Care/Home Management : 23-37 mins  Lesle Chris, OTR/L [phone] 12/25/2016   [place] 12/25/2016, 12:41 PM

## 2016-12-26 DIAGNOSIS — J4550 Severe persistent asthma, uncomplicated: Secondary | ICD-10-CM | POA: Diagnosis not present

## 2016-12-26 DIAGNOSIS — R0602 Shortness of breath: Secondary | ICD-10-CM | POA: Diagnosis not present

## 2016-12-26 DIAGNOSIS — M1990 Unspecified osteoarthritis, unspecified site: Secondary | ICD-10-CM | POA: Diagnosis not present

## 2016-12-26 DIAGNOSIS — R112 Nausea with vomiting, unspecified: Secondary | ICD-10-CM | POA: Diagnosis not present

## 2016-12-26 DIAGNOSIS — E785 Hyperlipidemia, unspecified: Secondary | ICD-10-CM | POA: Diagnosis not present

## 2016-12-26 DIAGNOSIS — S92153A Displaced avulsion fracture (chip fracture) of unspecified talus, initial encounter for closed fracture: Secondary | ICD-10-CM | POA: Diagnosis not present

## 2016-12-26 DIAGNOSIS — S72002A Fracture of unspecified part of neck of left femur, initial encounter for closed fracture: Secondary | ICD-10-CM | POA: Diagnosis not present

## 2016-12-26 DIAGNOSIS — F419 Anxiety disorder, unspecified: Secondary | ICD-10-CM | POA: Diagnosis not present

## 2016-12-26 DIAGNOSIS — I1310 Hypertensive heart and chronic kidney disease without heart failure, with stage 1 through stage 4 chronic kidney disease, or unspecified chronic kidney disease: Secondary | ICD-10-CM | POA: Diagnosis not present

## 2016-12-26 DIAGNOSIS — I739 Peripheral vascular disease, unspecified: Secondary | ICD-10-CM | POA: Diagnosis not present

## 2016-12-26 DIAGNOSIS — Z96642 Presence of left artificial hip joint: Secondary | ICD-10-CM | POA: Diagnosis not present

## 2016-12-26 DIAGNOSIS — I5032 Chronic diastolic (congestive) heart failure: Secondary | ICD-10-CM | POA: Diagnosis not present

## 2016-12-26 DIAGNOSIS — I2510 Atherosclerotic heart disease of native coronary artery without angina pectoris: Secondary | ICD-10-CM | POA: Diagnosis not present

## 2016-12-26 DIAGNOSIS — Z9181 History of falling: Secondary | ICD-10-CM | POA: Diagnosis not present

## 2016-12-26 DIAGNOSIS — R911 Solitary pulmonary nodule: Secondary | ICD-10-CM | POA: Diagnosis not present

## 2016-12-26 DIAGNOSIS — S72002D Fracture of unspecified part of neck of left femur, subsequent encounter for closed fracture with routine healing: Secondary | ICD-10-CM | POA: Diagnosis not present

## 2016-12-26 DIAGNOSIS — E663 Overweight: Secondary | ICD-10-CM | POA: Diagnosis not present

## 2016-12-26 DIAGNOSIS — I999 Unspecified disorder of circulatory system: Secondary | ICD-10-CM | POA: Diagnosis not present

## 2016-12-26 DIAGNOSIS — Z86718 Personal history of other venous thrombosis and embolism: Secondary | ICD-10-CM | POA: Diagnosis not present

## 2016-12-26 DIAGNOSIS — N183 Chronic kidney disease, stage 3 (moderate): Secondary | ICD-10-CM | POA: Diagnosis not present

## 2016-12-26 LAB — CBC
HCT: 29.8 % — ABNORMAL LOW (ref 36.0–46.0)
Hemoglobin: 9.8 g/dL — ABNORMAL LOW (ref 12.0–15.0)
MCH: 30.2 pg (ref 26.0–34.0)
MCHC: 32.9 g/dL (ref 30.0–36.0)
MCV: 91.7 fL (ref 78.0–100.0)
PLATELETS: 341 10*3/uL (ref 150–400)
RBC: 3.25 MIL/uL — ABNORMAL LOW (ref 3.87–5.11)
RDW: 13.1 % (ref 11.5–15.5)
WBC: 7.6 10*3/uL (ref 4.0–10.5)

## 2016-12-26 LAB — BASIC METABOLIC PANEL
Anion gap: 9 (ref 5–15)
BUN: 16 mg/dL (ref 6–20)
CO2: 30 mmol/L (ref 22–32)
CREATININE: 1.19 mg/dL — AB (ref 0.44–1.00)
Calcium: 8.8 mg/dL — ABNORMAL LOW (ref 8.9–10.3)
Chloride: 97 mmol/L — ABNORMAL LOW (ref 101–111)
GFR calc Af Amer: 48 mL/min — ABNORMAL LOW (ref >60)
GFR calc non Af Amer: 42 mL/min — ABNORMAL LOW (ref >60)
GLUCOSE: 118 mg/dL — AB (ref 65–99)
Potassium: 4.1 mmol/L (ref 3.5–5.1)
Sodium: 136 mmol/L (ref 135–145)

## 2016-12-26 MED ORDER — BOOST / RESOURCE BREEZE PO LIQD: 1.0000 | Freq: Three times a day (TID) | ORAL | Status: DC

## 2016-12-26 MED ORDER — BOOST / RESOURCE BREEZE PO LIQD: 1.0000 | Freq: Three times a day (TID) | ORAL | 0 refills | Status: DC

## 2016-12-26 MED ORDER — ONDANSETRON HCL 4 MG PO TABS: 4.0000 mg | ORAL_TABLET | Freq: Four times a day (QID) | ORAL | 0 refills | Status: DC | PRN

## 2016-12-26 MED ORDER — POLYETHYLENE GLYCOL 3350 17 G PO PACK: 17.0000 g | PACK | Freq: Every day | ORAL | 0 refills | Status: DC | PRN

## 2016-12-26 NOTE — Progress Notes (Signed)
Physical Therapy Treatment Patient Details Name: [name] MRN: [id number] DOB: [birth year] Today's Date: 12/26/2016    History of Present Illness This 83 year female was admitted after a fall in which she sustained a L displaced femoral neck fx. She is s/p posterior approach THA.  PMH:  asthma, CHF, HTN, breast CA, and cardiac cath    PT Comments    Pt tolerated session well and was able to ambulate today. Pt's pain elevated to 7/10 upon walking but pt overall reports feeling better.   Follow Up Recommendations  SNF;Supervision/Assistance - 24 hour     Equipment Recommendations  None recommended by PT    Recommendations for Other Services       Precautions / Restrictions Precautions Precautions: Posterior Hip;Fall Restrictions Weight Bearing Restrictions: No LLE Weight Bearing: Weight bearing as tolerated    Mobility  Bed Mobility Overal bed mobility: Needs Assistance Bed Mobility: Supine to Sit     Supine to sit: Min assist;HOB elevated     General bed mobility comments: Assist for L LE management    Transfers Overall transfer level: Needs assistance Equipment used: Rolling walker (2 wheeled) Transfers: Sit to/from Stand Sit to Stand: Min guard;From elevated surface         General transfer comment: min guard for safety, pt reports ambulating several times this morning to bathroom  Ambulation/Gait Ambulation/Gait assistance: Min guard Ambulation Distance (Feet): 80 Feet Assistive device: Rolling walker (2 wheeled) Gait Pattern/deviations: Step-through pattern;Decreased stride length;Decreased stance time - left     General Gait Details: Pt reports pain increase while ambulating to 7/10, slow pace   Stairs            Wheelchair Mobility    Modified Rankin (Stroke Patients Only)       Balance Overall balance assessment: History of Falls                                          Cognition Arousal/Alertness:  Awake/alert Behavior During Therapy: WFL for tasks assessed/performed Overall Cognitive Status: Within Functional Limits for tasks assessed                                        Exercises      General Comments        Pertinent Vitals/Pain Pain Assessment: 0-10 (pain elevated to 7/10 upon ambulation ) Pain Score: 7  Pain Location: Left Hip Pain Descriptors / Indicators: Discomfort;Sore Pain Intervention(s): Limited activity within patient's tolerance;Monitored during session;Ice applied;Repositioned    Home Living                      Prior Function            PT Goals (current goals can now be found in the care plan section)      Frequency    Min 5X/week      PT Plan Current plan remains appropriate    Co-evaluation              AM-PAC PT "6 Clicks" Daily Activity  Outcome Measure  Difficulty turning over in bed (including adjusting bedclothes, sheets and blankets)?: A Lot Difficulty moving from lying on back to sitting on the side of the bed? : Unable Difficulty sitting down on and standing up  from a chair with arms (e.g., wheelchair, bedside commode, etc,.)?: Unable Help needed moving to and from a bed to chair (including a wheelchair)?: A Little Help needed walking in hospital room?: A Little Help needed climbing 3-5 steps with a railing? : A Little 6 Click Score: 13    End of Session   Activity Tolerance: Patient tolerated treatment well Patient left: in chair;with family/visitor present;with call bell/phone within reach Nurse Communication: Mobility status PT Visit Diagnosis: Difficulty in walking, not elsewhere classified (R26.2);History of falling (Z91.81)     Time: 1040-1103 PT Time Calculation (min) (ACUTE ONLY): 23 min  Charges:  $Gait Training: 8-22 mins                    G Codes:       Olegario Shearer, SPT    Reino Bellis 12/26/2016, 1:16 PM

## 2016-12-26 NOTE — Progress Notes (Signed)
Report called to [facility] at [facility]. Patient to be transported via [place].

## 2016-12-26 NOTE — Progress Notes (Addendum)
Patient ready for discharge to [facility]. Facility notified. Updated discharge summary faxed to facility. Patient will transport by [facility].  Family is visiting at this time. 2:55PM-[facility] called for transport. ETA: 1 Hour. 30 min.   Kathrin Greathouse, Latanya Presser, MSW Clinical Social Worker 5E and Psychiatric Service Line [phone] 12/26/2016  2:13 PM

## 2016-12-26 NOTE — Discharge Summary (Addendum)
[facility] Discharge Summary   Patient: [name] ZDG:[id number]   PCP: Crist Infante, MD DOB: [birth year]   Date of admission: 12/19/2016   Date of discharge:  12/26/2016    Discharge Diagnoses:  Principal Problem:   Closed left hip fracture ([place]) Active Problems:   Hypertensive heart disease without CHF   Severe persistent asthma   History of DVT of lower extremity   CKD (chronic kidney disease) stage 3, GFR 30-59 ml/min   CAD (coronary artery disease), native coronary artery   Admitted From: home Disposition:  SNF  Recommendations for Outpatient Follow-up:  1. Follow-up with PCP in one week and orthopedic as recommended    Contact information for follow-up providers    Paralee Cancel, MD Follow up in 2 week(s).   Specialty:  Orthopedic Surgery Why:  wound check, check post op status Contact information: [address] [place] [address] [phone]        Paralee Cancel, MD Follow up.   Specialty:  Orthopedic Surgery Contact information: [address] [place] [place] [address]        Crist Infante, MD. Schedule an appointment as soon as possible for a visit in 1 week(s).   Specialty:  Internal Medicine Contact information: [address] [place] [address] [phone]            Contact information for after-discharge care    Destination    [facility] SN[facility]/ALF Follow up.   Specialty:  Skilled Nursing Facility Contact information: [address] [place] [phone]                 Diet recommendation: Cardiac diet  Activity: The patient is advised to gradually reintroduce usual activities.  Discharge Condition: good  Code Status: Full code  History of present illness: As per the H and P dictated on admission, "[name] is a 83 y.o. female with a past medical history of asthma, chronic diastolic CHF, hypertension, remote history of breast  cancer status post mastectomy, history of cardiac catheterization in 2008, with normal coronary arteries, low risk stress test in 2016, history of DVT about 2 years ago for which she continues to be on Xarelto and who was in her usual state of health until this afternoon when she was walking back into her house. She missed a step and fell and could not get up. She had pain in her left hip. She denies any passing out episodes. No chest pain, shortness of breath, either prior to or after the fall. No dizziness or lightheadedness. At baseline, the most she walks is to the mailbox and back. Does not climb stairs. Does not get any chest pain or shortness of breath with these activities. However, patient does have severe persistent asthma. She does mention that over the last 3 weeks she has noticed more leg swelling in both her lower extremities. She takes Lasix which seems to help, but has not been helping for the last 2-3 weeks.  In the emergency department. Patient underwent workup which revealed left femoral neck fracture. Patient will need hospitalization for further management."  Hospital Course:  Summary of her active problems in the hospital is as following.  Left femoral neck fracture This was a result of a mechanical fall.  Orthopedics was consulted.  Surgery had to wait as the patient was on Xarelto prior to admission.  Patient underwent left hemiarthroplasty on 8/13.  Postop Continues to have discomfort.  Weightbearing as  tolerated per orthopedics. Discharging to SNF.  History of severe persistent asthma Stable currently. Continue her medications. Incentive spirometry.   History of chronic diastolic CHF/pedal edema Echocardiogram does not show any worsening heart failure. Grade 1 diastolic dysfunction noted. Reason for her peripheral edema is not entirely clear. She was given 1 dose of IV Lasix on the day of admission. Lower extremity Doppler studies negative for DVT. Changing Lasix from  schedule to as needed.  Hyponatremia Possibly due to volume depletion. Sodium level stable.   History of lower extremity DVT. Last episode of DVT was 2 years ago. Continues to be an anticoagulation with Xarelto. This was held for surgery. No DVT detected on lower extremity Doppler studies. Xarelto has been resumed.  Chronic kidney disease stage III/hypomagnesemia Seems to be at baseline. Continue to monitor.   History of essential hypertension. Blood pressure is reasonably well controlled. Resuming home medications  Postoperative nausea and vomiting. Also due to narcotics. Continue pain regimen. Using when necessary antiemetics   Constipation  Given enema as well as magnesium citrate. Recommendation is to continue stool softener and keep herself well hydrated while she has increased BM   All other chronic medical condition were stable during the hospitalization.  Patient was seen by physical therapy, who recommended SNF, which was arranged by Education officer, museum and case Freight forwarder. On the day of the discharge the patient's vitals were stable, and no other acute medical condition were reported by patient. the patient was felt safe to be discharge at SNF with therapy.  Procedures and Results:  Transthoracic echocardiogram Study Conclusions  - Left ventricle: The cavity size was normal. Wall thickness was increased increased in a pattern of mild to moderate LVH. Systolic function was normal. The estimated ejection fraction was in the range of 55% to 60%. Wall motion was normal; there were no regional wall motion abnormalities. Doppler parameters are consistent with abnormal left ventricular relaxation (grade 1 diastolic dysfunction). - Ventricular septum: Septal motion showed mild dyssynergy. These changes are consistent with a left bundle branch block. - Mitral valve: Calcified annulus.   Left hip hemiarthroplasty  8/13  Consultations:  Orthopedics  DISCHARGE MEDICATION: Current Discharge Medication List    START taking these medications   Details  docusate sodium (COLACE) 100 MG capsule Take 1 capsule (100 mg total) by mouth 2 (two) times daily. Qty: 10 capsule, Refills: 0    !! feeding supplement (BOOST / RESOURCE BREEZE) LIQD Take 1 Container by mouth 3 (three) times daily between meals. Qty: 21 Container, Refills: 0    !! feeding supplement, ENSURE ENLIVE, (ENSURE ENLIVE) LIQD Take 237 mLs by mouth 2 (two) times daily between meals. Qty: 237 mL, Refills: 12    ferrous sulfate 325 (65 FE) MG tablet Take 1 tablet (325 mg total) by mouth 2 (two) times daily with a meal. Qty: 60 tablet, Refills: 0    HYDROcodone-acetaminophen (NORCO/VICODIN) 5-325 MG tablet Take 1-2 tablets by mouth every 6 (six) hours as needed for moderate pain. Qty: 40 tablet, Refills: 0    methocarbamol (ROBAXIN) 500 MG tablet Take 1 tablet (500 mg total) by mouth every 6 (six) hours as needed for muscle spasms. Qty: 60 tablet, Refills: 0    ondansetron (ZOFRAN) 4 MG tablet Take 1 tablet (4 mg total) by mouth every 6 (six) hours as needed for nausea. Qty: 20 tablet, Refills: 0    polyethylene glycol (MIRALAX / GLYCOLAX) packet Take 17 g by mouth daily as needed. Qty: 14 each, Refills: 0     !! -  Potential duplicate medications found. Please discuss with provider.    CONTINUE these medications which have CHANGED   Details  furosemide (LASIX) 20 MG tablet Take 1 tablet (20 mg total) by mouth daily as needed for fluid or edema. Qty: 30 tablet, Refills: 0      CONTINUE these medications which have NOT CHANGED   Details  acetaminophen (TYLENOL) 500 MG tablet Take 500 mg by mouth every 6 (six) hours as needed for mild pain or moderate pain.    albuterol (PROAIR HFA) 108 (90 Base) MCG/ACT inhaler Inhale 2 puffs into the lungs every 6 (six) hours as needed for wheezing or shortness of breath. Qty: 18 g, Refills: 5     albuterol (PROVENTIL) (2.5 MG/3ML) 0.083% nebulizer solution Take 3 mLs (2.5 mg total) by nebulization every 6 (six) hours as needed for wheezing or shortness of breath. Dx: J45.41 Qty: 360 mL, Refills: 6    benzonatate (TESSALON) 100 MG capsule TAKE 1 CAPSULE (100 MG TOTAL) BY MOUTH 3 (THREE) TIMES DAILY AS NEEDED FOR COUGH. Qty: 100 capsule, Refills: 1    budesonide-formoterol (SYMBICORT) 160-4.5 MCG/ACT inhaler INHALE 2 PUFFS INTO THE LUNGS TWICE A DAY Qty: 3 Inhaler, Refills: 3    Calcium Carbonate-Vitamin D (CALTRATE 600+D) 600-400 MG-UNIT per tablet Take 1 tablet by mouth daily.     chlorpheniramine (CHLOR-TRIMETON) 4 MG tablet Take by mouth. 4 mg in the morning and 8 mg at bedtime    Cholecalciferol (VITAMIN D3) 1000 UNITS tablet Take 1,000 Units by mouth daily.      dextromethorphan (DELSYM) 30 MG/5ML liquid Take 60 mg by mouth 2 (two) times daily as needed for cough.    esomeprazole (NEXIUM) 40 MG capsule Take 1 capsule (40 mg total) by mouth 2 (two) times daily. Name brand only Qty: 180 capsule, Refills: 3    fexofenadine (ALLEGRA) 180 MG tablet Take 180 mg by mouth daily.      mometasone (NASONEX) 50 MCG/ACT nasal spray USE 2 SPRAYS NASALLY once daily Qty: 51 g, Refills: 6    montelukast (SINGULAIR) 10 MG tablet Take 1 tablet (10 mg total) by mouth at bedtime. Qty: 30 tablet, Refills: 5    Multiple Vitamin (MULTIVITAMIN) tablet Take 1 tablet by mouth daily.    pramipexole (MIRAPEX) 0.125 MG tablet Take 0.375 mg by mouth at bedtime.     rosuvastatin (CRESTOR) 5 MG tablet Take 1 tablet (5 mg total) by mouth at bedtime. Qty: 90 tablet, Refills: 2    Spacer/Aero-Holding Chambers (AEROCHAMBER MV) inhaler by Other route. Use as instructed     telmisartan (MICARDIS) 20 MG tablet TAKE 1 TABLET AT BEDTIME Qty: 90 tablet, Refills: 3    Tiotropium Bromide Monohydrate (SPIRIVA RESPIMAT) 2.5 MCG/ACT AERS Inhale 2 puffs into the lungs daily. Qty: 3 Inhaler, Refills: 3     vitamin B-12 (CYANOCOBALAMIN) 500 MCG tablet Take 500 mcg by mouth daily.      XARELTO 20 MG TABS tablet Take 20 mg by mouth daily.       STOP taking these medications     potassium chloride (K-DUR) 10 MEQ tablet      predniSONE (DELTASONE) 10 MG tablet        Allergies  Allergen Reactions  . Eggs Or Egg-Derived Products     RAW EGGS.. Can eat cooked eggs  . Influenza Vaccines   . Nucala [Mepolizumab]   . Cephalosporins Rash  . Codeine Rash  . Levofloxacin Rash  . Pneumococcal Vaccines Rash   Discharge Instructions  Diet - low sodium heart healthy    Complete by:  As directed    Discharge instructions    Complete by:  As directed    It is important that you read following instructions as well as go over your medication list with RN to help you understand your care after this hospitalization.  Discharge Instructions: Please follow-up with PCP in one week  Please request your primary care physician to go over all Hospital Tests and Procedure/Radiological results at the follow up,  Please get all Hospital records sent to your PCP by signing hospital release before you go home.   Do not take more than prescribed Pain, Sleep and Anxiety Medications. You were cared for by a hospitalist during your hospital stay. If you have any questions about your discharge medications or the care you received while you were in the hospital after you are discharged, you can call the unit and ask to speak with the hospitalist on call if the hospitalist that took care of you is not available.  Once you are discharged, your primary care physician will handle any further medical issues. Please note that NO REFILLS for any discharge medications will be authorized once you are discharged, as it is imperative that you return to your primary care physician (or establish a relationship with a primary care physician if you do not have one) for your aftercare needs so that they can reassess your need for  medications and monitor your lab values. You Must read complete instructions/literature along with all the possible adverse reactions/side effects for all the Medicines you take and that have been prescribed to you. Take any new Medicines after you have completely understood and accept all the possible adverse reactions/side effects. Wear Seat belts while driving. If you have smoked or chewed Tobacco in the last 2 yrs please stop smoking and/or stop any Recreational drug use.   Increase activity slowly    Complete by:  As directed    Weight bearing as tolerated    Complete by:  As directed      Discharge Exam: Filed Weights   12/19/16 1604  Weight: 68.9 kg (152 lb)   Vitals:   12/26/16 0703 12/26/16 1112  BP: (!) 144/50   Pulse: 99   Resp: 18   Temp: 97.8 F (36.6 C)   SpO2: 93% 94%   General: Appear in mild distress, no Rash; Oral Mucosa moist. Cardiovascular: S1 and S2 Present, no Murmur, no JVD Respiratory: Bilateral Air entry present and Clear to Auscultation, no Crackles, no wheezes Abdomen: Bowel Sound present, Soft and no tenderness Extremities: no Pedal edema, no calf tenderness Neurology: Grossly no focal neuro deficit.  The results of significant diagnostics from this hospitalization (including imaging, microbiology, ancillary and laboratory) are listed below for reference.    Significant Diagnostic Studies: Dg Chest 1 View  Result Date: 12/19/2016 CLINICAL DATA:  Pain following fall EXAM: CHEST 1 VIEW COMPARISON:  August 17, 2014 FINDINGS: There is no edema or consolidation. The heart size and pulmonary vascularity are normal. No adenopathy. No pneumothorax. No fracture evident. IMPRESSION: No edema or consolidation. Electronically Signed   By: Lowella Grip III M.D.   On: 12/19/2016 16:38   Pelvis Portable  Result Date: 12/22/2016 CLINICAL DATA:  Hip replacement . EXAM: PORTABLE PELVIS 1-2 VIEWS COMPARISON:  CT 06/26/2009 . FINDINGS: Left hip replacement.  Anatomic alignment on AP view. Hardware intact. No acute abnormality. IMPRESSION: Left hip replacement.  Anatomic alignment on AP view. Electronically  Signed   By: Marcello Moores  Register   On: 12/22/2016 15:53   Dg Abd Portable 1v  Result Date: 12/25/2016 CLINICAL DATA:  Constipation, history of hip surgery EXAM: PORTABLE ABDOMEN - 1 VIEW COMPARISON:  12/22/2016 FINDINGS: Linear atelectasis at the left lung base. Status post left hip replacement with normal alignment. Mild diffuse increased small and large bowel gas. Moderate feces in the right colon. Surgical clips in the right upper quadrant. IMPRESSION: Mild diffuse increased small and large bowel gas but without obstructive pattern. Moderate stool in the right colon. Electronically Signed   By: Donavan Foil M.D.   On: 12/25/2016 19:47   Dg Humerus Left  Result Date: 12/24/2016 CLINICAL DATA:  [place] 5 days ago, distal LEFT humeral pain for 1 day EXAM: LEFT HUMERUS - 2+ VIEW COMPARISON:  None FINDINGS: Osseous demineralization. Shoulder and elbow joint alignments grossly normal. No acute fracture, dislocation, or bone destruction. IMPRESSION: No acute osseous abnormalities. Electronically Signed   By: Lavonia Dana M.D.   On: 12/24/2016 14:01   Dg Hip Unilat With Pelvis 2-3 Views Left  Result Date: 12/19/2016 CLINICAL DATA:  Pain following fall EXAM: DG HIP (WITH OR WITHOUT PELVIS) 2-3V LEFT COMPARISON:  None. FINDINGS: Frontal pelvis as well as frontal and lateral left hip images were obtained. There is an impacted fracture in the subcapital femoral neck region on the left. No other fracture. No dislocation. There is slight symmetric narrowing of both hip joints. There is mild osteoarthritic change in each sacroiliac joint. IMPRESSION: Impacted left subcapital femoral neck fracture. No other fracture. No dislocation. Mild narrowing of both hip joints as well as mild osteoarthritic change in each sacroiliac joint. Electronically Signed   By: Lowella Grip  III M.D.   On: 12/19/2016 16:37    Microbiology: Recent Results (from the past 240 hour(s))  Surgical PCR screen     Status: None   Collection Time: 12/22/16  1:01 AM  Result Value Ref Range Status   MRSA, PCR NEGATIVE NEGATIVE Final   Staphylococcus aureus NEGATIVE NEGATIVE Final    Comment:        The Xpert SA Assay (FDA approved for NASAL specimens in patients over 17 years of age), is one component of a comprehensive surveillance program.  Test performance has been validated by [facility] for patients greater than or equal to 15 year old. It is not intended to diagnose infection nor to guide or monitor treatment.     Labs: CBC:  Recent Labs Lab 12/19/16 1650 12/20/16 0600 12/23/16 0548 12/24/16 0931 12/25/16 0805 12/26/16 0714  WBC 8.7 7.9 7.4 7.2 6.9 7.6  NEUTROABS 6.7  --   --   --   --   --   HGB 12.3 11.8* 10.4* 9.8* 9.2* 9.8*  HCT 37.5 36.0 31.0* 29.1* 27.5* 29.8*  MCV 91.2 90.5 91.4 89.0 90.5 91.7  PLT 284 219 233 232 250 536   Basic Metabolic Panel:  Recent Labs Lab 12/22/16 0641 12/23/16 0548 12/24/16 0726 12/25/16 0805 12/26/16 0714  NA 130* 130* 128* 132* 136  K 4.3 4.3 4.3 4.5 4.1  CL 95* 95* 95* 97* 97*  CO2 27 26 25 28 30   GLUCOSE 110* 126* 122* 109* 118*  BUN 19 19 21* 18 16  CREATININE 1.28* 1.22* 1.20* 1.17* 1.19*  CALCIUM 8.5* 8.1* 8.1* 8.4* 8.8*  MG  --  1.6* 1.9 2.0  --    Time spent: 35 minutes  Signed:  Berle Mull  [facility]  12/26/2016  , 1:52 PM

## 2016-12-28 ENCOUNTER — Other Ambulatory Visit: Payer: Self-pay | Admitting: Cardiovascular Disease

## 2016-12-29 DIAGNOSIS — S72002D Fracture of unspecified part of neck of left femur, subsequent encounter for closed fracture with routine healing: Secondary | ICD-10-CM | POA: Diagnosis not present

## 2016-12-29 DIAGNOSIS — Z9181 History of falling: Secondary | ICD-10-CM | POA: Diagnosis not present

## 2016-12-29 DIAGNOSIS — I5032 Chronic diastolic (congestive) heart failure: Secondary | ICD-10-CM | POA: Diagnosis not present

## 2016-12-29 DIAGNOSIS — R112 Nausea with vomiting, unspecified: Secondary | ICD-10-CM | POA: Diagnosis not present

## 2017-01-01 ENCOUNTER — Ambulatory Visit: Payer: Medicare Other | Admitting: Pulmonary Disease

## 2017-01-01 DIAGNOSIS — R0602 Shortness of breath: Secondary | ICD-10-CM | POA: Diagnosis not present

## 2017-01-01 DIAGNOSIS — I999 Unspecified disorder of circulatory system: Secondary | ICD-10-CM | POA: Diagnosis not present

## 2017-01-01 DIAGNOSIS — Z96642 Presence of left artificial hip joint: Secondary | ICD-10-CM | POA: Diagnosis not present

## 2017-01-05 ENCOUNTER — Other Ambulatory Visit: Payer: Self-pay | Admitting: Pulmonary Disease

## 2017-01-05 DIAGNOSIS — J4550 Severe persistent asthma, uncomplicated: Secondary | ICD-10-CM | POA: Diagnosis not present

## 2017-01-05 DIAGNOSIS — I130 Hypertensive heart and chronic kidney disease with heart failure and stage 1 through stage 4 chronic kidney disease, or unspecified chronic kidney disease: Secondary | ICD-10-CM | POA: Diagnosis not present

## 2017-01-05 DIAGNOSIS — I2510 Atherosclerotic heart disease of native coronary artery without angina pectoris: Secondary | ICD-10-CM | POA: Diagnosis not present

## 2017-01-05 DIAGNOSIS — I5032 Chronic diastolic (congestive) heart failure: Secondary | ICD-10-CM | POA: Diagnosis not present

## 2017-01-05 DIAGNOSIS — S72012D Unspecified intracapsular fracture of left femur, subsequent encounter for closed fracture with routine healing: Secondary | ICD-10-CM | POA: Diagnosis not present

## 2017-01-05 DIAGNOSIS — N183 Chronic kidney disease, stage 3 (moderate): Secondary | ICD-10-CM | POA: Diagnosis not present

## 2017-01-06 ENCOUNTER — Ambulatory Visit: Payer: Medicare Other | Admitting: Cardiovascular Disease

## 2017-01-07 DIAGNOSIS — Z471 Aftercare following joint replacement surgery: Secondary | ICD-10-CM | POA: Diagnosis not present

## 2017-01-07 DIAGNOSIS — Z96642 Presence of left artificial hip joint: Secondary | ICD-10-CM | POA: Diagnosis not present

## 2017-01-08 DIAGNOSIS — I2510 Atherosclerotic heart disease of native coronary artery without angina pectoris: Secondary | ICD-10-CM | POA: Diagnosis not present

## 2017-01-08 DIAGNOSIS — J4550 Severe persistent asthma, uncomplicated: Secondary | ICD-10-CM | POA: Diagnosis not present

## 2017-01-08 DIAGNOSIS — I5032 Chronic diastolic (congestive) heart failure: Secondary | ICD-10-CM | POA: Diagnosis not present

## 2017-01-08 DIAGNOSIS — S72012D Unspecified intracapsular fracture of left femur, subsequent encounter for closed fracture with routine healing: Secondary | ICD-10-CM | POA: Diagnosis not present

## 2017-01-08 DIAGNOSIS — N183 Chronic kidney disease, stage 3 (moderate): Secondary | ICD-10-CM | POA: Diagnosis not present

## 2017-01-08 DIAGNOSIS — I130 Hypertensive heart and chronic kidney disease with heart failure and stage 1 through stage 4 chronic kidney disease, or unspecified chronic kidney disease: Secondary | ICD-10-CM | POA: Diagnosis not present

## 2017-01-09 DIAGNOSIS — M810 Age-related osteoporosis without current pathological fracture: Secondary | ICD-10-CM | POA: Diagnosis not present

## 2017-01-09 DIAGNOSIS — D508 Other iron deficiency anemias: Secondary | ICD-10-CM | POA: Diagnosis not present

## 2017-01-09 DIAGNOSIS — D509 Iron deficiency anemia, unspecified: Secondary | ICD-10-CM | POA: Diagnosis not present

## 2017-01-09 DIAGNOSIS — I10 Essential (primary) hypertension: Secondary | ICD-10-CM | POA: Diagnosis not present

## 2017-01-13 DIAGNOSIS — S72012D Unspecified intracapsular fracture of left femur, subsequent encounter for closed fracture with routine healing: Secondary | ICD-10-CM | POA: Diagnosis not present

## 2017-01-13 DIAGNOSIS — I2510 Atherosclerotic heart disease of native coronary artery without angina pectoris: Secondary | ICD-10-CM | POA: Diagnosis not present

## 2017-01-13 DIAGNOSIS — J4550 Severe persistent asthma, uncomplicated: Secondary | ICD-10-CM | POA: Diagnosis not present

## 2017-01-13 DIAGNOSIS — N183 Chronic kidney disease, stage 3 (moderate): Secondary | ICD-10-CM | POA: Diagnosis not present

## 2017-01-13 DIAGNOSIS — I5032 Chronic diastolic (congestive) heart failure: Secondary | ICD-10-CM | POA: Diagnosis not present

## 2017-01-13 DIAGNOSIS — I130 Hypertensive heart and chronic kidney disease with heart failure and stage 1 through stage 4 chronic kidney disease, or unspecified chronic kidney disease: Secondary | ICD-10-CM | POA: Diagnosis not present

## 2017-01-16 DIAGNOSIS — I2510 Atherosclerotic heart disease of native coronary artery without angina pectoris: Secondary | ICD-10-CM | POA: Diagnosis not present

## 2017-01-16 DIAGNOSIS — I5032 Chronic diastolic (congestive) heart failure: Secondary | ICD-10-CM | POA: Diagnosis not present

## 2017-01-16 DIAGNOSIS — J4550 Severe persistent asthma, uncomplicated: Secondary | ICD-10-CM | POA: Diagnosis not present

## 2017-01-16 DIAGNOSIS — I130 Hypertensive heart and chronic kidney disease with heart failure and stage 1 through stage 4 chronic kidney disease, or unspecified chronic kidney disease: Secondary | ICD-10-CM | POA: Diagnosis not present

## 2017-01-16 DIAGNOSIS — S72012D Unspecified intracapsular fracture of left femur, subsequent encounter for closed fracture with routine healing: Secondary | ICD-10-CM | POA: Diagnosis not present

## 2017-01-16 DIAGNOSIS — N183 Chronic kidney disease, stage 3 (moderate): Secondary | ICD-10-CM | POA: Diagnosis not present

## 2017-01-19 ENCOUNTER — Telehealth: Payer: Self-pay | Admitting: Pulmonary Disease

## 2017-01-19 ENCOUNTER — Telehealth: Payer: Self-pay | Admitting: Cardiovascular Disease

## 2017-01-19 DIAGNOSIS — E782 Mixed hyperlipidemia: Secondary | ICD-10-CM

## 2017-01-19 NOTE — Telephone Encounter (Signed)
Lab orders placed for November appointment

## 2017-01-19 NOTE — Telephone Encounter (Signed)
New mESSAGE    Needs order put in for lab work for lipid
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
## 2017-01-19 NOTE — Telephone Encounter (Signed)
Spoke with the pt  She states that she is needing to reschedule with [facility] and for Xolair inj  She was scheduled back in Aug 2018 and feel and fractured her hip so had to cancel  I scheduled her for the first available with [facility] for 02/18/17  She is wondering if okay to get xolair then, or if she can come in for this now  Last inj was 12/11/16 Please advise thanks

## 2017-01-19 NOTE — Telephone Encounter (Signed)
She can come in for the Xolair now if she wants or wait if she wants.

## 2017-01-20 DIAGNOSIS — N183 Chronic kidney disease, stage 3 (moderate): Secondary | ICD-10-CM | POA: Diagnosis not present

## 2017-01-20 DIAGNOSIS — J4550 Severe persistent asthma, uncomplicated: Secondary | ICD-10-CM | POA: Diagnosis not present

## 2017-01-20 DIAGNOSIS — S72012D Unspecified intracapsular fracture of left femur, subsequent encounter for closed fracture with routine healing: Secondary | ICD-10-CM | POA: Diagnosis not present

## 2017-01-20 DIAGNOSIS — I130 Hypertensive heart and chronic kidney disease with heart failure and stage 1 through stage 4 chronic kidney disease, or unspecified chronic kidney disease: Secondary | ICD-10-CM | POA: Diagnosis not present

## 2017-01-20 DIAGNOSIS — I2510 Atherosclerotic heart disease of native coronary artery without angina pectoris: Secondary | ICD-10-CM | POA: Diagnosis not present

## 2017-01-20 DIAGNOSIS — I5032 Chronic diastolic (congestive) heart failure: Secondary | ICD-10-CM | POA: Diagnosis not present

## 2017-01-21 ENCOUNTER — Ambulatory Visit (INDEPENDENT_AMBULATORY_CARE_PROVIDER_SITE_OTHER): Payer: Medicare Other

## 2017-01-21 DIAGNOSIS — J4550 Severe persistent asthma, uncomplicated: Secondary | ICD-10-CM

## 2017-01-26 MED ORDER — OMALIZUMAB 150 MG ~~LOC~~ SOLR
300.0000 mg | Freq: Once | SUBCUTANEOUS | Status: AC
  Administered 2017-01-21: 300 mg via SUBCUTANEOUS

## 2017-02-02 DIAGNOSIS — M810 Age-related osteoporosis without current pathological fracture: Secondary | ICD-10-CM | POA: Diagnosis not present

## 2017-02-02 DIAGNOSIS — I10 Essential (primary) hypertension: Secondary | ICD-10-CM | POA: Diagnosis not present

## 2017-02-02 DIAGNOSIS — Z6824 Body mass index (BMI) 24.0-24.9, adult: Secondary | ICD-10-CM | POA: Diagnosis not present

## 2017-02-02 DIAGNOSIS — D508 Other iron deficiency anemias: Secondary | ICD-10-CM | POA: Diagnosis not present

## 2017-02-03 ENCOUNTER — Telehealth: Payer: Self-pay | Admitting: Pulmonary Disease

## 2017-02-04 ENCOUNTER — Ambulatory Visit: Payer: Medicare Other

## 2017-02-04 ENCOUNTER — Ambulatory Visit (INDEPENDENT_AMBULATORY_CARE_PROVIDER_SITE_OTHER)
Admission: RE | Admit: 2017-02-04 | Discharge: 2017-02-04 | Disposition: A | Discharge status: Home / Self Care | Payer: Medicare Other | Source: Ambulatory Visit | Attending: Adult Health | Admitting: Adult Health

## 2017-02-04 ENCOUNTER — Encounter: Payer: Self-pay | Admitting: Adult Health

## 2017-02-04 ENCOUNTER — Ambulatory Visit (INDEPENDENT_AMBULATORY_CARE_PROVIDER_SITE_OTHER): Payer: Medicare Other | Admitting: Adult Health

## 2017-02-04 DIAGNOSIS — S72002D Fracture of unspecified part of neck of left femur, subsequent encounter for closed fracture with routine healing: Secondary | ICD-10-CM | POA: Diagnosis not present

## 2017-02-04 DIAGNOSIS — J4551 Severe persistent asthma with (acute) exacerbation: Secondary | ICD-10-CM

## 2017-02-04 DIAGNOSIS — R05 Cough: Secondary | ICD-10-CM | POA: Diagnosis not present

## 2017-02-04 MED ORDER — AMOXICILLIN-POT CLAVULANATE 875-125 MG PO TABS: 1.0000 | ORAL_TABLET | Freq: Two times a day (BID) | ORAL | 0 refills | Status: AC

## 2017-02-04 MED ORDER — PREDNISONE 10 MG PO TABS: ORAL_TABLET | ORAL | 0 refills | Status: DC

## 2017-02-04 NOTE — Addendum Note (Signed)
Addended by: Parke Poisson E on: 02/04/2017 10:53 AM   Modules accepted: Orders

## 2017-02-04 NOTE — Telephone Encounter (Signed)
Error

## 2017-02-04 NOTE — Patient Instructions (Signed)
Augmentin 875mg  Twice daily  For 7 days , take with food.  Mucinex DM Twice daily  As needed  Cough/congestion  Prednisone taper over next week.  Chest xray today .  Follow up with Dr. Ashok Cordia as planned next month and As needed   Please contact office for sooner follow up if symptoms do not improve or worsen or seek emergency care

## 2017-02-04 NOTE — Progress Notes (Signed)
@[name], female    DOB: [birth year], 83 y.o.   MRN: [id number]  Chief Complaint  Patient presents with  . 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   TEST  10/10/2011 spirometry shows normal spirometry  CT chest November2015 in March 2016: No reports of emphysema seen on CT chest or interstitial lung disease  FeNO in our oifice is 47ppb and ELEVATED  TEST  PFT 07/02/13: FVC 2.39 L (90%) FEV1 1.77 L (90%) FEV1/FVC 0.74 FEF 25-75 1.32 L (95%) negative bronchodilator response 01/16/16: FVC 2.26 L (84%) FEV1 1.54 L (78%) FEV1/FVC 0.68 FEF 25-75 0.92 L (65%) positive bronchodilator response 10/31/15: FVC 1.88 L (70%) FEV1 1.26 L (63%) FEV1/FVC 0.67 FEF 25-75 0.68 L (43%) negative bronchodilator response 08/03/15: FVC 2.29 L (85%) FEV1 1.63 L (81%) FEV1/FVC 0.71 FEF 25-75 1.05 L (73%) positive bronchodilator response TLC 5.97 L (115%) RV 143% ERV 35% DLCO corrected 72% (Hgb 12.1) 10/10/11:FVC 2.21 L (77%) FEV1 1.57 L (74%) FEV1/FVC 0.71 FEF 25-75 1.10 L (64%)  02/04/2017 Acute OV : Asthma  Pt presents for an acute office  Visit. Complains of 1 week of cough, congestion , drainage , hoarseness, wheezing  sob, thick yellow mucus . Taking albuterol neb with some help.  Remains on Symbicort and Spiriva , Singulair .  Wheezing has been worse for last 3 days .  Denies chest pain calf pain, hemoptysis, fever, or nausea, vomiting, diarrhea. Appetite is fair.  Had hip fracture s/p replacement in 12/19/16 .  Doing well , has ov with ortho this week.     Allergies  Allergen Reactions  . Eggs Or Egg-Derived Products     RAW EGGS.. Can eat cooked eggs  . Influenza Vaccines   . Nucala [Mepolizumab]   . Cephalosporins Rash  . Codeine Rash  . Levofloxacin Rash  . Pneumococcal Vaccines Rash    Immunization History  Administered Date(s) Administered    . Pneumococcal Conjugate-13 09/14/2012  . Pneumococcal Polysaccharide-23 01/31/2009    Past Medical History:  Diagnosis Date  . Asthma, severe persistent    pulmologist-- dr Joya Gaskins  . Chronic kidney disease, stage II (mild)   . DDD (degenerative disc disease), cervical   . Diverticulosis   . Edema, lower extremity   . GERD (gastroesophageal reflux disease)   . History of breast cancer    1989  S/P   RIGHT MASTECTOMY ;  NO CHEMORADIATION //   NO RECURRENCE  . History of DVT of lower extremity   . History of shingles   . Hyperlipidemia   . Leg ulcer, left ([place])   . Osteoporosis, unspecified    Knee and hip osteoarthritis bilaterally  . Perennial allergic rhinitis   . Restless legs syndrome (RLS)   . Unspecified essential hypertension     Tobacco History: History  Smoking Status  . Never Smoker  Smokeless Tobacco  . Never Used   Counseling given: Not Answered   Outpatient Encounter Prescriptions as of 02/04/2017  Medication Sig  . acetaminophen (TYLENOL) 500 MG tablet Take 500 mg by mouth every 6 (six) hours as needed for mild pain or moderate pain.  [name] albuterol (PROAIR HFA) 108 (90 Base) MCG/ACT inhaler Inhale 2 puffs into the lungs every 6 (six) hours as needed for wheezing or shortness of breath.  [name] albuterol (PROVENTIL) (2.5 MG/3ML) 0.083% nebulizer solution  Take 3 mLs (2.5 mg total) by nebulization every 6 (six) hours as needed for wheezing or shortness of breath. Dx: J45.41  . benzonatate (TESSALON) 100 MG capsule TAKE 1 CAPSULE (100 MG TOTAL) BY MOUTH 3 (THREE) TIMES DAILY AS NEEDED FOR COUGH.  . budesonide-formoterol (SYMBICORT) 160-4.5 MCG/ACT inhaler INHALE 2 PUFFS INTO THE LUNGS TWICE A DAY  . Calcium Carbonate-Vitamin D (CALTRATE 600+D) 600-400 MG-UNIT per tablet Take 1 tablet by mouth daily.   . chlorpheniramine (CHLOR-TRIMETON) 4 MG tablet Take by mouth. 4 mg in the morning and 8 mg at bedtime  . Cholecalciferol (VITAMIN D3) 1000 UNITS tablet Take 1,000 Units  by mouth daily.    [name] dextromethorphan (DELSYM) 30 MG/5ML liquid Take 60 mg by mouth 2 (two) times daily as needed for cough.  . docusate sodium (COLACE) 100 MG capsule Take 1 capsule (100 mg total) by mouth 2 (two) times daily.  [name] esomeprazole (NEXIUM) 40 MG capsule Take 1 capsule (40 mg total) by mouth 2 (two) times daily. Name brand only (Patient taking differently: Take 40 mg by mouth every evening. Name brand only)  . feeding supplement (BOOST / RESOURCE BREEZE) LIQD Take 1 Container by mouth 3 (three) times daily between meals.  . feeding supplement, ENSURE ENLIVE, (ENSURE ENLIVE) LIQD Take 237 mLs by mouth 2 (two) times daily between meals.  . ferrous sulfate 325 (65 FE) MG tablet Take 1 tablet (325 mg total) by mouth 2 (two) times daily with a meal.  . fexofenadine (ALLEGRA) 180 MG tablet Take 180 mg by mouth daily.    . furosemide (LASIX) 20 MG tablet Take 1 tablet (20 mg total) by mouth daily as needed for fluid or edema.  . methocarbamol (ROBAXIN) 500 MG tablet Take 1 tablet (500 mg total) by mouth every 6 (six) hours as needed for muscle spasms.  . mometasone (NASONEX) 50 MCG/ACT nasal spray USE 2 SPRAYS NASALLY once daily  . montelukast (SINGULAIR) 10 MG tablet Take 1 tablet (10 mg total) by mouth at bedtime.  . Multiple Vitamin (MULTIVITAMIN) tablet Take 1 tablet by mouth daily.  . pramipexole (MIRAPEX) 0.125 MG tablet Take 0.375 mg by mouth at bedtime.   . rosuvastatin (CRESTOR) 5 MG tablet TAKE 1 TABLET BY MOUTH AT BEDTIME  . Spacer/Aero-Holding Chambers (AEROCHAMBER MV) inhaler by Other route. Use as instructed   . telmisartan (MICARDIS) 20 MG tablet TAKE 1 TABLET AT BEDTIME  . Tiotropium Bromide Monohydrate (SPIRIVA RESPIMAT) 2.5 MCG/ACT AERS Inhale 2 puffs into the lungs daily.  . vitamin B-12 (CYANOCOBALAMIN) 500 MCG tablet Take 500 mcg by mouth daily.    [name] Reasons 20 MG TABS tablet Take 20 mg by mouth daily.   Arvid Right 150 MG injection INJECT 300 MG SUBCUTANEOUSLY EVERY 2  WEEKS.  . [DISCONTINUED] HYDROcodone-acetaminophen (NORCO/VICODIN) 5-325 MG tablet Take 1-2 tablets by mouth every 6 (six) hours as needed for moderate pain.  . [DISCONTINUED] ondansetron (ZOFRAN) 4 MG tablet Take 1 tablet (4 mg total) by mouth every 6 (six) hours as needed for nausea.  . [DISCONTINUED] polyethylene glycol (MIRALAX / GLYCOLAX) packet Take 17 g by mouth daily as needed.  [name] amoxicillin-clavulanate (AUGMENTIN) 875-125 MG tablet Take 1 tablet by mouth 2 (two) times daily.  . predniSONE (DELTASONE) 10 MG tablet 4 tabs for 2 days, then 3 tabs for 2 days, 2 tabs for 2 days, then 1 tab for 2 days, then stop   Facility-Administered Encounter Medications as of 02/04/2017  Medication  . omalizumab Arvid Right) injection  300 mg     Review of Systems  Constitutional:   No  weight loss, night sweats,  Fevers, chills, fatigue, or  lassitude.  HEENT:   No headaches,  Difficulty swallowing,  Tooth/dental problems, or  Sore throat,                No sneezing, itching, ear ache,  +nasal congestion, post nasal drip,   CV:  No chest pain,  Orthopnea, PND, swelling in lower extremities, anasarca, dizziness, palpitations, syncope.   GI  No heartburn, indigestion, abdominal pain, nausea, vomiting, diarrhea, change in bowel habits, loss of appetite, bloody stools.   Resp:    No chest wall deformity  Skin: no rash or lesions.  GU: no dysuria, change in color of urine, no urgency or frequency.  No flank pain, no hematuria   MS:  No joint pain or swelling.  No decreased range of motion.  No back pain.    Physical Exam  BP 136/76 (BP Location: Right Arm, Cuff Size: Normal)   Pulse (!) 102   SpO2 98%   GEN: A/Ox3; pleasant , NAD, elderly    HEENT:  [place]/AT,  EACs-clear, TMs-wnl, NOSE-clear drainage THROAT-clear, no lesions, no postnasal drip or exudate noted.   NECK:  Supple w/ fair ROM; no JVD; normal carotid impulses w/o bruits; no thyromegaly or nodules palpated; no lymphadenopathy.     RESP exp wheezing , speaks in full sentences ,  no accessory muscle use, no dullness to percussion  CARD:  RRR, no m/r/g, no peripheral edema, pulses intact, no cyanosis or clubbing.  GI:   Soft & nt; nml bowel sounds; no organomegaly or masses detected.   Musco: Warm bil, no deformities or joint swelling noted.   Neuro: alert, no focal deficits noted.    Skin: Warm, no lesions or rashes    Lab Results:  CBC  BMET  BNP No results found for: BNP  ProBNP No results found for: PROBNP  Imaging: No results found.   Assessment & Plan:   Severe persistent asthma Exacerbation with bronchitis  Check cxr today   Plan  Patient Instructions  Augmentin 875mg  Twice daily  For 7 days , take with food.  Mucinex DM Twice daily  As needed  Cough/congestion  Prednisone taper over next week.  Chest xray today .  Follow up with Dr. Ashok Cordia as planned next month and As needed   Please contact office for sooner follow up if symptoms do not improve or worsen or seek emergency care      Closed left hip fracture ([place]) follow up with ortho as planned this week.  Advised to let them know had to use steroids for asthma flare .      Rexene Edison, NP 02/04/2017

## 2017-02-04 NOTE — Assessment & Plan Note (Addendum)
Exacerbation with bronchitis  Check cxr today   Plan  Patient Instructions  Augmentin 875mg  Twice daily  For 7 days , take with food.  Mucinex DM Twice daily  As needed  Cough/congestion  Prednisone taper over next week.  Chest xray today .  Follow up with Dr. Ashok Cordia as planned next month and As needed   Please contact office for sooner follow up if symptoms do not improve or worsen or seek emergency care

## 2017-02-04 NOTE — Assessment & Plan Note (Signed)
follow up with ortho as planned this week.  Advised to let them know had to use steroids for asthma flare .

## 2017-02-04 NOTE — Progress Notes (Signed)
Note reviewed.  Sonia Baller Ashok Cordia, M.D. [facility] Pulmonary & Critical Care Pager:  [phone] After 3pm or if no response, call [phone] 10:53 AM 02/04/17

## 2017-02-06 DIAGNOSIS — Z96642 Presence of left artificial hip joint: Secondary | ICD-10-CM | POA: Diagnosis not present

## 2017-02-06 DIAGNOSIS — M25562 Pain in left knee: Secondary | ICD-10-CM | POA: Diagnosis not present

## 2017-02-06 DIAGNOSIS — Z471 Aftercare following joint replacement surgery: Secondary | ICD-10-CM | POA: Diagnosis not present

## 2017-02-06 DIAGNOSIS — G8929 Other chronic pain: Secondary | ICD-10-CM | POA: Diagnosis not present

## 2017-02-11 ENCOUNTER — Ambulatory Visit (INDEPENDENT_AMBULATORY_CARE_PROVIDER_SITE_OTHER): Payer: Medicare Other

## 2017-02-11 DIAGNOSIS — J4540 Moderate persistent asthma, uncomplicated: Secondary | ICD-10-CM

## 2017-02-12 MED ORDER — OMALIZUMAB 150 MG ~~LOC~~ SOLR
300.0000 mg | SUBCUTANEOUS | Status: DC
  Administered 2017-02-11: 300 mg via SUBCUTANEOUS

## 2017-02-12 NOTE — Progress Notes (Signed)
Xolair injection documentation and charges entered by Raekwon Winkowski, RMA, based on injection sheet filled out by Tammy Scott per office protocol.   

## 2017-02-13 ENCOUNTER — Telehealth: Payer: Self-pay | Admitting: Pulmonary Disease

## 2017-02-16 NOTE — Telephone Encounter (Signed)
#   vials:4 Ordered date:02/13/17 Shipping Date:02/19/17  Forgot to to put order in.

## 2017-02-18 ENCOUNTER — Encounter: Payer: Self-pay | Admitting: Pulmonary Disease

## 2017-02-18 ENCOUNTER — Ambulatory Visit (INDEPENDENT_AMBULATORY_CARE_PROVIDER_SITE_OTHER): Payer: Medicare Other | Admitting: Pulmonary Disease

## 2017-02-18 VITALS — BP 120/70 | HR 92 | Ht 63.0 in | Wt 145.4 lb

## 2017-02-18 DIAGNOSIS — K219 Gastro-esophageal reflux disease without esophagitis: Secondary | ICD-10-CM | POA: Diagnosis not present

## 2017-02-18 DIAGNOSIS — J309 Allergic rhinitis, unspecified: Secondary | ICD-10-CM | POA: Diagnosis not present

## 2017-02-18 DIAGNOSIS — J4550 Severe persistent asthma, uncomplicated: Secondary | ICD-10-CM | POA: Diagnosis not present

## 2017-02-18 MED ORDER — EPINEPHRINE 0.3 MG/0.3ML IJ SOAJ: 0.3000 mg | Freq: Once | INTRAMUSCULAR | 2 refills | Status: AC

## 2017-02-18 MED ORDER — MOMETASONE FUROATE 50 MCG/ACT NA SUSP: 2.0000 | Freq: Every day | NASAL | 11 refills | Status: DC

## 2017-02-18 NOTE — Progress Notes (Signed)
Subjective:    Patient ID: [name], female    DOB: 01-21-1933, 83 y.o.   MRN: [id number]  C.C.:  Follow-up for Severe, Persistent Asthma, Chronic Allergic Rhinitis, DVT, & GERD w/ Hiatal Hernia.  HPI Patient has been seen twice in our office since her last appointment with me. Last visit was on 9/26 with nurse practitioner. Patient was treated for an exacerbation of her asthma with bronchitis. She was given a prednisone taper, instructed to use Mucinex twice daily as needed, and a 7 day course of Augmentin. Since her last appointment she fractured her left hip. She is continuing to walk with a cane. She reports no adverse effects from her Xolair. She does feel the Xolair is helping her significantly. Hasn't needed her rescue inhaler significantly in the last few weeks. Only rare nocturnal awakenings with any dyspnea.   Severe, persistent asthma: Patient currently maintained on a regimen of Xolair, Singulair, Spiriva, and Symbicort. She feels her dyspnea has significantly improved. She still has a scratchy throat & hoarse voice. She has her baseline nonproductive cough. No wheezing.  Chronic allergic rhinitis: Previously referred to ENT. Regimen at last appointment with me included Allegra, Singulair, Nasonex, and Xolair. Patient has had multiple different antibiotic courses for recurrent sinusitis. Only having intermittent sinus congestion & drainage.   DVT: Present in bilateral lower extremities. Patient on systemic anticoagulation with Xarelto indefinitely. No melena or hematochezia. No hematuria.   GERD with hiatal hernia: Previously prescribed Nexium. No reflux or dyspepsia. No morning brash water taste.   Review of Systems  No chest pain or tightness. No fever or chills. No abdominal pain or nausea.   Allergies  Allergen Reactions  . Eggs Or Egg-Derived Products     RAW EGGS.. Can eat cooked eggs  . Influenza Vaccines   . Nucala [Mepolizumab]   . Cephalosporins Rash  . Codeine  Rash  . Levofloxacin Rash  . Pneumococcal Vaccines Rash    Current Outpatient Prescriptions on File Prior to Visit  Medication Sig Dispense Refill  . acetaminophen (TYLENOL) 500 MG tablet Take 500 mg by mouth every 6 (six) hours as needed for mild pain or moderate pain.    [name] albuterol (PROAIR HFA) 108 (90 Base) MCG/ACT inhaler Inhale 2 puffs into the lungs every 6 (six) hours as needed for wheezing or shortness of breath. 18 g 5  . albuterol (PROVENTIL) (2.5 MG/3ML) 0.083% nebulizer solution Take 3 mLs (2.5 mg total) by nebulization every 6 (six) hours as needed for wheezing or shortness of breath. Dx: J45.41 360 mL 6  . benzonatate (TESSALON) 100 MG capsule TAKE 1 CAPSULE (100 MG TOTAL) BY MOUTH 3 (THREE) TIMES DAILY AS NEEDED FOR COUGH. 100 capsule 1  . budesonide-formoterol (SYMBICORT) 160-4.5 MCG/ACT inhaler INHALE 2 PUFFS INTO THE LUNGS TWICE A DAY 3 Inhaler 3  . Calcium Carbonate-Vitamin D (CALTRATE 600+D) 600-400 MG-UNIT per tablet Take 1 tablet by mouth daily.     . chlorpheniramine (CHLOR-TRIMETON) 4 MG tablet Take by mouth. 4 mg in the morning and 8 mg at bedtime    . Cholecalciferol (VITAMIN D3) 1000 UNITS tablet Take 1,000 Units by mouth daily.      [name] dextromethorphan (DELSYM) 30 MG/5ML liquid Take 60 mg by mouth 2 (two) times daily as needed for cough.    . esomeprazole (NEXIUM) 40 MG capsule Take 1 capsule (40 mg total) by mouth 2 (two) times daily. Name brand only (Patient taking differently: Take 40 mg by mouth every evening. Name  brand only) 180 capsule 3  . feeding supplement (BOOST / RESOURCE BREEZE) LIQD Take 1 Container by mouth 3 (three) times daily between meals. 21 Container 0  . fexofenadine (ALLEGRA) 180 MG tablet Take 180 mg by mouth daily.      . furosemide (LASIX) 20 MG tablet Take 1 tablet (20 mg total) by mouth daily as needed for fluid or edema. 30 tablet 0  . mometasone (NASONEX) 50 MCG/ACT nasal spray USE 2 SPRAYS NASALLY once daily 51 g 6  . montelukast  (SINGULAIR) 10 MG tablet Take 1 tablet (10 mg total) by mouth at bedtime. 30 tablet 5  . Multiple Vitamin (MULTIVITAMIN) tablet Take 1 tablet by mouth daily.    . pramipexole (MIRAPEX) 0.125 MG tablet Take 0.375 mg by mouth at bedtime.     . rosuvastatin (CRESTOR) 5 MG tablet TAKE 1 TABLET BY MOUTH AT BEDTIME 90 tablet 0  . Spacer/Aero-Holding Chambers (AEROCHAMBER MV) inhaler by Other route. Use as instructed     . telmisartan (MICARDIS) 20 MG tablet TAKE 1 TABLET AT BEDTIME 90 tablet 3  . Tiotropium Bromide Monohydrate (SPIRIVA RESPIMAT) 2.5 MCG/ACT AERS Inhale 2 puffs into the lungs daily. 3 Inhaler 3  . vitamin B-12 (CYANOCOBALAMIN) 500 MCG tablet Take 500 mcg by mouth daily.      [name] Reasons 20 MG TABS tablet Take 20 mg by mouth daily.     Arvid Right 150 MG injection INJECT 300 MG SUBCUTANEOUSLY EVERY 2 WEEKS. 4 each 5  . docusate sodium (COLACE) 100 MG capsule Take 1 capsule (100 mg total) by mouth 2 (two) times daily. (Patient not taking: Reported on 02/18/2017) 10 capsule 0  . feeding supplement, ENSURE ENLIVE, (ENSURE ENLIVE) LIQD Take 237 mLs by mouth 2 (two) times daily between meals. (Patient not taking: Reported on 02/18/2017) 237 mL 12  . ferrous sulfate 325 (65 FE) MG tablet Take 1 tablet (325 mg total) by mouth 2 (two) times daily with a meal. (Patient not taking: Reported on 02/18/2017) 60 tablet 0  . methocarbamol (ROBAXIN) 500 MG tablet Take 1 tablet (500 mg total) by mouth every 6 (six) hours as needed for muscle spasms. (Patient not taking: Reported on 02/18/2017) 60 tablet 0  . predniSONE (DELTASONE) 10 MG tablet 4 tabs for 2 days, then 3 tabs for 2 days, 2 tabs for 2 days, then 1 tab for 2 days, then stop (Patient not taking: Reported on 02/18/2017) 20 tablet 0   Current Facility-Administered Medications on File Prior to Visit  Medication Dose Route Frequency Provider Last Rate Last Dose  . omalizumab Arvid Right) injection 300 mg  300 mg Subcutaneous Q14 Days Javier Glazier, MD    300 mg at 12/11/16 1914  . omalizumab Arvid Right) injection 300 mg  300 mg Subcutaneous Q14 Days Javier Glazier, MD   300 mg at 02/11/17 0930    Past Medical History:  Diagnosis Date  . Asthma, severe persistent    pulmologist-- dr Joya Gaskins  . Chronic kidney disease, stage II (mild)   . DDD (degenerative disc disease), cervical   . Diverticulosis   . Edema, lower extremity   . GERD (gastroesophageal reflux disease)   . History of breast cancer    1989  S/P   RIGHT MASTECTOMY ;  NO CHEMORADIATION //   NO RECURRENCE  . History of DVT of lower extremity   . History of shingles   . Hyperlipidemia   . Leg ulcer, left ([place])   . Osteoporosis, unspecified  Knee and hip osteoarthritis bilaterally  . Perennial allergic rhinitis   . Restless legs syndrome (RLS)   . Unspecified essential hypertension     Past Surgical History:  Procedure Laterality Date  . CARDIAC CATHETERIZATION  08/ 01/ 2008   dr Cathie Olden   normal -- EF of 65%  . CARDIOVASCULAR STRESS TEST  05-22-2011   dr Cathie Olden   normal lexiscan perfusion study/  no ischemia/  lvsf  86%  . CATARACT EXTRACTION W/ INTRAOCULAR LENS  IMPLANT, BILATERAL    . CHOLECYSTECTOMY  1993  . HIP ARTHROPLASTY Left 12/22/2016   Procedure: HEMIARTHROPLASTY, LEFT;  Surgeon: Paralee Cancel, MD;  Location: [facility];  Service: Orthopedics;  Laterality: Left;  . INCISION AND DRAINAGE OF WOUND Left 10/24/2013   Procedure: IRRIGATION AND DEBRIDEMENT OF LEFT LEG WITH PLACEMENT OF ACELL AND WOUND VAC;  Surgeon: Theodoro Kos, DO;  Location: [facility];  Service: Plastics;  Laterality: Left;  . [place]  . MASTECTOMY Bilateral rigth 1989///   left  1990   breast cancer 1989//   fibrocytic disease 1990  . TOTAL ABDOMINAL HYSTERECTOMY W/ BILATERAL SALPINGOOPHORECTOMY  1989   done at same time as right mastectomy  . TOTAL KNEE ARTHROPLASTY Right 10/05/2012   Procedure: RIGHT TOTAL KNEE ARTHROPLASTY;  Surgeon: Mauri Pole,  MD;  Location: [facility];  Service: Orthopedics;  Laterality: Right;    Family History  Problem Relation Age of Onset  . Heart attack Mother   . Asthma Mother   . Heart disease Mother   . Hyperlipidemia Mother   . Heart attack Father   . Heart failure Father   . Deep vein thrombosis Father   . Heart disease Father   . Peripheral vascular disease Father        amputation  . Cancer Brother   . Asthma Brother   . Coronary artery disease Sister   . Heart disease Sister     Social History   Social History  . Marital status: Married    Spouse name: N/A  . Number of children: N/A  . Years of education: N/A   Social History Main Topics  . Smoking status: Never Smoker  . Smokeless tobacco: Never Used  . Alcohol use No  . Drug use: No  . Sexual activity: Not Asked   Other Topics Concern  . None   Social History Narrative   Originally from [place]. Always lived in [place]. Has traveled across the country to [place]. 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 No known asbestos exposure (brother with mesothelioma). No pets currently. No bird exposure. No mold exposure. Carpet has been removed from her home.       Objective:   Physical Exam BP 120/70 (BP Location: Left Arm, Patient Position: Sitting, Cuff Size: Normal)   Pulse 92   Ht 5\' 3"  (1.6 m)   Wt 145 lb 6.4 oz (66 kg)   SpO2 100%   BMI 25.76 kg/m   General:  Awake. No distress. Comfortable. Integument:  Warm. Dry. No rash. Extremities:  No cyanosis or clubbing.  HEENT: Minimal nasal turbinate swelling. No scleral icterus. Moist membranes. No oral ulcers. Cardiovascular:  Regular rate. No edema appreciated. Unable to appreciate JVD with body positioning.  Pulmonary:  Clear bilaterally with auscultation. Normal work of breathing on room air with good aeration. Abdomen: Soft. Normal bowel sounds. Minimally protuberant. Musculoskeletal:  Normal bulk and tone. No  joint deformity or effusion  appreciated. Neurological:  Cranial nerves 2-12 grossly in tact. No meningismus. Moving all 4 extremities equally.   PFT 07/02/13: FVC 2.39 L (90%) FEV1 1.77 L (90%) FEV1/FVC 0.74 FEF 25-75 1.32 L (95%) negative bronchodilator response 01/16/16: FVC 2.26 L (84%) FEV1 1.54 L (78%) FEV1/FVC 0.68 FEF 25-75 0.92 L (65%) positive bronchodilator response 10/31/15: FVC 1.88 L (70%) FEV1 1.26 L (63%) FEV1/FVC 0.67 FEF 25-75 0.68 L (43%) negative bronchodilator response 08/03/15: FVC 2.29 L (85%) FEV1 1.63 L (81%) FEV1/FVC 0.71 FEF 25-75 1.05 L (73%) positive bronchodilator response TLC 5.97 L (115%) RV 143% ERV 35% DLCO corrected 72% (Hgb 12.1) 10/10/11:FVC 2.21 L (77%) FEV1 1.57 L (74%) FEV1/FVC 0.71 FEF 25-75 1.10 L (64%)  IMAGING CXR PA/LAT 02/04/17 (personally reviewed by me):  Hyperinflation with flattening of the diaphragms bilaterally. Chronic elevation of left hemidiaphragm. No pleural effusion. No parenchymal mass or opacity appreciated. Heart normal in size & mediastinum normal in contour.  CXR PA/LAT 08/17/14 (previously reviewed by me): Hyperinflation with flattening of the hemidiaphragms bilaterally. No focal opacity. Hila are full bilaterally. Heart normal in size. No pleural effusion appreciated.   CT CHEST W/O 07/31/14 (previously reviewed by me): Radiology noted a 1.3cm right thyroid nodule. No pleural effusion or thickening. No pericardial effusion. No pathologic mediastinal adenopathy. 55mm LUL nodule stable since 2007 on my review. No other area of consolidation. Mild apical emphysematous changes. Bilateral upper lobe ground glass nodules as well as a ground glass nodule in right lower lobe. Areas of linear opacity consistent with scar tissue formation.  CARDIAC EKG (05/30/15):  Previously reviewed by me. Normal sinus rhythm. QTc 417 ms. Previous Q-wave in V3 has resolved. No obvious signs of ischemia. Patient does have early repolarization in V1 & V2 similar to prior EKG.  TTE  (03/16/15): LV normal in size with EF 60-65%. Normal wall motion. Grade 1 diastolic dysfunction. LA & RA normal in size. RV normal in size and function. Pulmonary artery systolic pressure within normal range. Pulmonary artery normal in size. No aortic stenosis or regurgitation. No mitral stenosis or regurgitation. No pulmonic stenosis or regurgitation. Mild tricuspid regurgitation. No pericardial effusion.  LABS 12/26/16 CBC: 7.6/9.8/29.8/341 Creatinine: 1.19 BUN: 16  10/31/15 CBC: 5.1/12.7/38.8/2:30 Eosinophil: 0.3 IgE: 425 RAST panel: Negative  11/20/14 Eosinophils: 0.9    Assessment & Plan:  83 y.o.  female with severe, persistent asthma, chronic allergic rhinitis, bilateral lower extremity DVT, & GERD with hiatal hernia. Patient's reflux is well controlled on her current regimen as is her chronic allergic rhinitis. Symptomatically she does feel she is improving with the addition of Xolair to her regimen and has tolerated this much better than Nucala. She seems were recovered well from her recent asthma exacerbation. I reviewed her previous chest x-ray which did not indicate any pneumonia. I encouraged the patient to continue taking her medications as prescribed. I instructed her to contact our office if she had any new breathing problems or questions before next appointment.  1. Severe, persistent asthma:  Refilled EpiPen today. Continuing Xolair, Symbicort, Spiriva, and Singulair. No changes. 2. Chronic allergic rhinitis: Controlled with her current regimen of Nasonex, Xolair, Allegra, and Singulair. No changes. 3. Bilateral lower extremity DVT: Continuing treatment with Xarelto indefinitely. 4. GERD with hiatal hernia: Asymptomatic on Nexium. No changes. 5. Health maintenance: Unable to tolerate influenza vaccine due to allergy & previously had severe allergic reaction to pneumonia vaccine as well. 6. Follow-up: Return to clinic in 3 months or sooner  if needed.  Sonia Baller Ashok Cordia,  M.D. [facility] Pulmonary & Critical Care Pager:  [phone] After 3pm or if no response, call [phone] 4:03 PM 02/18/17

## 2017-02-18 NOTE — Patient Instructions (Addendum)
   Continue using your medications as prescribed.  Call our office if you have any new breathing problems or questions before your next appointment.  We will see you back in 3 months or sooner if needed.

## 2017-02-18 NOTE — Addendum Note (Signed)
Addended by: Tyson Dense on: 02/18/2017 04:41 PM   Modules accepted: Orders

## 2017-02-19 DIAGNOSIS — M810 Age-related osteoporosis without current pathological fracture: Secondary | ICD-10-CM | POA: Diagnosis not present

## 2017-02-19 NOTE — Telephone Encounter (Signed)
#   Vials:4 Arrival Date:02/19/17 Lot #[id number] Exp Date:07/2020

## 2017-02-24 ENCOUNTER — Other Ambulatory Visit: Payer: Self-pay | Admitting: Pulmonary Disease

## 2017-02-25 ENCOUNTER — Ambulatory Visit (INDEPENDENT_AMBULATORY_CARE_PROVIDER_SITE_OTHER): Payer: Medicare Other

## 2017-02-25 DIAGNOSIS — J4550 Severe persistent asthma, uncomplicated: Secondary | ICD-10-CM | POA: Diagnosis not present

## 2017-02-27 MED ORDER — OMALIZUMAB 150 MG ~~LOC~~ SOLR
300.0000 mg | Freq: Once | SUBCUTANEOUS | Status: AC
  Administered 2017-02-25: 300 mg via SUBCUTANEOUS

## 2017-03-11 ENCOUNTER — Ambulatory Visit (INDEPENDENT_AMBULATORY_CARE_PROVIDER_SITE_OTHER): Payer: Medicare Other

## 2017-03-11 DIAGNOSIS — J4540 Moderate persistent asthma, uncomplicated: Secondary | ICD-10-CM | POA: Diagnosis not present

## 2017-03-12 MED ORDER — OMALIZUMAB 150 MG ~~LOC~~ SOLR
300.0000 mg | SUBCUTANEOUS | Status: DC
  Administered 2017-03-11: 300 mg via SUBCUTANEOUS

## 2017-03-12 NOTE — Progress Notes (Signed)
Documentation of medication administration and charges of Xolair have been completed by Lindsay Lemons, CMA based on the Xolair documentation sheet completed by Tammy Scott.  

## 2017-03-16 ENCOUNTER — Telehealth: Payer: Self-pay | Admitting: Pulmonary Disease

## 2017-03-16 NOTE — Telephone Encounter (Signed)
#   vials:4 Ordered date:03/16/17 Shipping Date:03/18/17 Requested xolair be del. 03/19/17 (faxed)

## 2017-03-17 ENCOUNTER — Telehealth: Payer: Self-pay | Admitting: Pulmonary Disease

## 2017-03-17 MED ORDER — PREDNISONE 20 MG PO TABS: ORAL_TABLET | ORAL | 0 refills | Status: DC

## 2017-03-17 NOTE — Telephone Encounter (Signed)
Spoke with pt, who nasal drainage, prod cough with clear mucus & wheezing x5d Denies fever, chills or sweats.  Pt is requesting Rx for prednisone.   JN please advise. Thanks.

## 2017-03-17 NOTE — Telephone Encounter (Signed)
Pt is aware of below message and voiced her understanding.  Rx has been sent to preferred pharmacy. Nothing further needed.

## 2017-03-17 NOTE — Telephone Encounter (Signed)
Ok. Please send in a prescription for prednisone 40mg  daily x5 days. Have her let us know if she doesn't improve after a couple of days. Thanks.

## 2017-03-18 DIAGNOSIS — M25562 Pain in left knee: Secondary | ICD-10-CM | POA: Diagnosis not present

## 2017-03-18 DIAGNOSIS — Z96649 Presence of unspecified artificial hip joint: Secondary | ICD-10-CM | POA: Diagnosis not present

## 2017-03-18 DIAGNOSIS — Z96642 Presence of left artificial hip joint: Secondary | ICD-10-CM | POA: Diagnosis not present

## 2017-03-18 DIAGNOSIS — Z471 Aftercare following joint replacement surgery: Secondary | ICD-10-CM | POA: Diagnosis not present

## 2017-03-19 ENCOUNTER — Encounter: Payer: Self-pay | Admitting: Cardiovascular Disease

## 2017-03-19 ENCOUNTER — Ambulatory Visit (INDEPENDENT_AMBULATORY_CARE_PROVIDER_SITE_OTHER): Payer: Medicare Other | Admitting: Cardiovascular Disease

## 2017-03-19 ENCOUNTER — Other Ambulatory Visit: Payer: Medicare Other | Admitting: *Deleted

## 2017-03-19 VITALS — BP 120/84 | HR 60 | Ht 63.0 in | Wt 145.0 lb

## 2017-03-19 DIAGNOSIS — I5032 Chronic diastolic (congestive) heart failure: Secondary | ICD-10-CM | POA: Insufficient documentation

## 2017-03-19 DIAGNOSIS — E782 Mixed hyperlipidemia: Secondary | ICD-10-CM

## 2017-03-19 LAB — BASIC METABOLIC PANEL
BUN/Creatinine Ratio: 17 (ref 12–28)
BUN: 24 mg/dL (ref 8–27)
CALCIUM: 10.1 mg/dL (ref 8.7–10.3)
CHLORIDE: 98 mmol/L (ref 96–106)
CO2: 26 mmol/L (ref 20–29)
Creatinine, Ser: 1.39 mg/dL — ABNORMAL HIGH (ref 0.57–1.00)
GFR calc Af Amer: 41 mL/min/{1.73_m2} — ABNORMAL LOW (ref >59)
GFR calc non Af Amer: 35 mL/min/{1.73_m2} — ABNORMAL LOW (ref >59)
GLUCOSE: 119 mg/dL — AB (ref 65–99)
POTASSIUM: 4.8 mmol/L (ref 3.5–5.2)
Sodium: 138 mmol/L (ref 134–144)

## 2017-03-19 LAB — LIPID PANEL
CHOL/HDL RATIO: 1.6 ratio (ref 0.0–4.4)
Cholesterol, Total: 186 mg/dL (ref 100–199)
HDL: 114 mg/dL (ref >39)
LDL Calculated: 61 mg/dL (ref 0–99)
Triglycerides: 57 mg/dL (ref 0–149)
VLDL CHOLESTEROL CAL: 11 mg/dL (ref 5–40)

## 2017-03-19 LAB — HEPATIC FUNCTION PANEL
ALBUMIN: 4 g/dL (ref 3.5–4.7)
ALT: 12 IU/L (ref 0–32)
AST: 18 IU/L (ref 0–40)
Alkaline Phosphatase: 84 IU/L (ref 39–117)
BILIRUBIN TOTAL: 0.3 mg/dL (ref 0.0–1.2)
Bilirubin, Direct: 0.13 mg/dL (ref 0.00–0.40)
Total Protein: 6.8 g/dL (ref 6.0–8.5)

## 2017-03-19 MED ORDER — FUROSEMIDE 20 MG PO TABS: 20.0000 mg | ORAL_TABLET | Freq: Every day | ORAL | 3 refills | Status: DC | PRN

## 2017-03-19 NOTE — Progress Notes (Signed)
[name] Date of Birth  [birth year]       [facility]    [employer] [address], [place], [place]  [address]   [place], [place]  [address] [phone]     [phone]   Fax  [phone]    Fax [phone]  Problem List: 1. History of chest pain-normal coronary arteries by heart catheterization in August, 2008 2. History of breast cancer-status post right mastectomy and 1989-status post left mastectomy in 1990 3. Hypertension 4. Hyperlipidemia 5. Asthma 6. Chronic diastolic CHF      [name] has had lots of problems with her asthma this summer.  She's not had any cardiac complaints. She denies any chest pain. She's not been able to exercise much of the summer because her asthma problems.  Oct. 23, 2014:  She has had a total knee replacement since I saw her.   She is back exercising some - rehab for her knee.  Has lost a few lbs.   Oct. 27, 2015:  [name] is doing ok from a cardiac standpoint. She's had a wound on the left lower leg which she's been tending to.  She has been going to the wound Center.   [name] history of asthma and has chronic shortness of breath. No angina like pain.    Oct. 27, 2016  Has had lots of breathing issues.  Has had a rough year with her lungs.  Recently had PFTs and was found to her 47% predicted capacity ( on one of the measured functions )  Wakes up with CP on occasion .   Also has some exertional CP .   Sep 12, 2015:  [name] is doing ok from a cardiac standpoint.  Had reported some  chest pain  myoview revealed normal left ventricular systolic function and no ischemia. The echocardiogram revealed normal left ventricle systolic function. She does have grade 1 diastolic dysfunction. She has mild tricuspid regurgitation.  She has not taken her amlodipine this week - due to leg swelling . She wants to keep it on her med list just in case her BP goes up .   She tried Nucala  injections for her asthma.   Did not tolerate it   Has lost 6 lbs   Nov. 2, 2017:  Doing well - she is followed for chronic diastolic congestive heart failure. Has issues with her asthma .  Active but no real exercise   Nov. 8, 2018:     [name] was admitted to the hospital on August 10 after falling and fracturing her left hip.    Current Outpatient Medications on File Prior to Visit  Medication Sig Dispense Refill  . acetaminophen (TYLENOL) 500 MG tablet Take 500 mg by mouth every 6 (six) hours as needed for mild pain or moderate pain.    [name] albuterol (PROAIR HFA) 108 (90 Base) MCG/ACT inhaler Inhale 2 puffs into the lungs every 6 (six) hours as needed for wheezing or shortness of breath. 18 g 5  . albuterol (PROVENTIL) (2.5 MG/3ML) 0.083% nebulizer solution Take 3 mLs (2.5 mg total) by nebulization every 6 (six) hours as needed for wheezing or shortness of breath. Dx: J45.41 360 mL 6  . benzonatate (TESSALON) 100 MG capsule TAKE 1 CAPSULE (100 MG TOTAL) BY MOUTH 3 (THREE) TIMES DAILY AS NEEDED FOR COUGH. 100 capsule 1  . Calcium Carbonate-Vitamin D (CALTRATE 600+D) 600-400 MG-UNIT per tablet Take 1 tablet by mouth daily.     [name]  chlorpheniramine (CHLOR-TRIMETON) 4 MG tablet Take by mouth. 4 mg in the morning and 8 mg at bedtime    . Cholecalciferol (VITAMIN D3) 1000 UNITS tablet Take 1,000 Units by mouth daily.      [name] dextromethorphan (DELSYM) 30 MG/5ML liquid Take 60 mg by mouth 2 (two) times daily as needed for cough.    . esomeprazole (NEXIUM) 40 MG capsule Take 1 capsule (40 mg total) by mouth 2 (two) times daily. Name brand only (Patient taking differently: Take 40 mg by mouth every evening. Name brand only) 180 capsule 3  . feeding supplement, ENSURE ENLIVE, (ENSURE ENLIVE) LIQD Take 237 mLs by mouth 2 (two) times daily between meals. 237 mL 12  . ferrous sulfate 325 (65 FE) MG tablet Take 1 tablet (325 mg total) by mouth 2 (two) times daily with a meal. 60 tablet 0  . fexofenadine  (ALLEGRA) 180 MG tablet Take 180 mg by mouth daily.      . furosemide (LASIX) 20 MG tablet Take 1 tablet (20 mg total) by mouth daily as needed for fluid or edema. 30 tablet 0  . mometasone (NASONEX) 50 MCG/ACT nasal spray Place 2 sprays into the nose daily. 51 g 11  . montelukast (SINGULAIR) 10 MG tablet Take 1 tablet (10 mg total) by mouth at bedtime. 30 tablet 5  . Multiple Vitamin (MULTIVITAMIN) tablet Take 1 tablet by mouth daily.    . pramipexole (MIRAPEX) 0.125 MG tablet Take 0.375 mg by mouth at bedtime.     . predniSONE (DELTASONE) 20 MG tablet 2 tabs daily for 5 days. 10 tablet 0  . rosuvastatin (CRESTOR) 5 MG tablet TAKE 1 TABLET BY MOUTH AT BEDTIME 90 tablet 0  . Spacer/Aero-Holding Chambers (AEROCHAMBER MV) inhaler by Other route. Use as instructed     . SYMBICORT 160-4.5 MCG/ACT inhaler INHALE 2 PUFFS TWICE A DAY 30.6 Inhaler 1  . telmisartan (MICARDIS) 20 MG tablet TAKE 1 TABLET AT BEDTIME 90 tablet 3  . Tiotropium Bromide Monohydrate (SPIRIVA RESPIMAT) 2.5 MCG/ACT AERS Inhale 2 puffs into the lungs daily. 3 Inhaler 3  . vitamin B-12 (CYANOCOBALAMIN) 500 MCG tablet Take 500 mcg by mouth daily.      [name] Reasons 20 MG TABS tablet Take 20 mg by mouth daily.      Current Facility-Administered Medications on File Prior to Visit  Medication Dose Route Frequency Provider Last Rate Last Dose  . omalizumab Arvid Right) injection 300 mg  300 mg Subcutaneous Q14 Days Javier Glazier, MD   300 mg at 03/11/17 3762    Allergies  Allergen Reactions  . Eggs Or Egg-Derived Products     RAW EGGS.. Can eat cooked eggs  . Influenza Vaccines   . Nucala [Mepolizumab]   . Cephalosporins Rash  . Codeine Rash  . Levofloxacin Rash  . Pneumococcal Vaccines Rash    Past Medical History:  Diagnosis Date  . Asthma, severe persistent    pulmologist-- dr Joya Gaskins  . Chronic kidney disease, stage II (mild)   . DDD (degenerative disc disease), cervical   . Diverticulosis   . Edema, lower extremity     . GERD (gastroesophageal reflux disease)   . History of breast cancer    1989  S/P   RIGHT MASTECTOMY ;  NO CHEMORADIATION //   NO RECURRENCE  . History of DVT of lower extremity   . History of shingles   . Hyperlipidemia   . Leg ulcer, left ([place])   . Osteoporosis, unspecified  Knee and hip osteoarthritis bilaterally  . Perennial allergic rhinitis   . Restless legs syndrome (RLS)   . Unspecified essential hypertension     Past Surgical History:  Procedure Laterality Date  . CARDIAC CATHETERIZATION  08/ 01/ 2008   dr Cathie Olden   normal -- EF of 65%  . CARDIOVASCULAR STRESS TEST  05-22-2011   dr Cathie Olden   normal lexiscan perfusion study/  no ischemia/  lvsf  86%  . CATARACT EXTRACTION W/ INTRAOCULAR LENS  IMPLANT, BILATERAL    . CHOLECYSTECTOMY  1993  . [place]  . MASTECTOMY Bilateral rigth 1989///   left  1990   breast cancer 1989//   fibrocytic disease 1990  . TOTAL ABDOMINAL HYSTERECTOMY W/ BILATERAL SALPINGOOPHORECTOMY  1989   done at same time as right mastectomy    Social History   Tobacco Use  Smoking Status Never Smoker  Smokeless Tobacco Never Used    Social History   Substance and Sexual Activity  Alcohol Use No    Family History  Problem Relation Age of Onset  . Heart attack Mother   . Asthma Mother   . Heart disease Mother   . Hyperlipidemia Mother   . Heart attack Father   . Heart failure Father   . Deep vein thrombosis Father   . Heart disease Father   . Peripheral vascular disease Father        amputation  . Cancer Brother   . Asthma Brother   . Coronary artery disease Sister   . Heart disease Sister     Reviw of Systems:  Reviewed in the HPI.  All other systems are negative.  Physical Exam: Blood pressure 120/84, pulse 60, height 5\' 3"  (1.6 m), weight 145 lb (65.8 kg), SpO2 99 %.  GEN:  Well nourished, well developed in no acute distress HEENT: Normal NECK: No JVD; No carotid bruits LYMPHATICS: No  lymphadenopathy CARDIAC: RR, no murmurs, rubs, gallops RESPIRATORY:  Clear to auscultation without rales, wheezing or rhonchi  ABDOMEN: Soft, non-tender, non-distended MUSCULOSKELETAL:  No edema; No deformity  SKIN: Warm and dry NEUROLOGIC:  Alert and oriented x 3   ECG:    Assessment / Plan:  : 1. Chest discomfort: Sister has done well.  She has not had any episodes of severe chest discomfort.  2. Chronic diastolic congestive heart failure:    Her Lasix was held for a few days during the hospitalization and she developed significant leg edema.  Her Lasix was increased to 20 mg a day and she is now feeling better.  We will refill that dose for her.  We will check labs today occluding a basic metabolic profile, liver enzymes, lipid profile.  3. Dyspnea:    She has chronic asthma.  She is followed by pulmonary.   Mertie Moores, MD  03/19/2017 9:59 AM    [address],  [address] [place], [place]  [address] Pager [phone] Phone: [phone]; Fax: [phone]

## 2017-03-19 NOTE — Patient Instructions (Signed)
Medication Instructions:  Your physician recommends that you continue on your current medications as directed. Please refer to the Current Medication list given to you today.   Labwork: TODAY - cholesterol, basic metabolic panel, liver panel   Testing/Procedures: None Ordered   Follow-Up: Your physician wants you to follow-up in: 1 year with Dr. Acie Fredrickson.  You will receive a reminder letter in the mail two months in advance. If you don't receive a letter, please call our office to schedule the follow-up appointment.   If you need a refill on your cardiac medications before your next appointment, please call your pharmacy.   Thank you for choosing [facility]! Christen Bame, RN [phone]

## 2017-03-19 NOTE — Telephone Encounter (Signed)
#   Vials:4 Arrival Date:03/19/17 Lot #:[id number] Exp Date:07/2020

## 2017-03-20 ENCOUNTER — Other Ambulatory Visit: Payer: Medicare Other

## 2017-03-25 ENCOUNTER — Ambulatory Visit (INDEPENDENT_AMBULATORY_CARE_PROVIDER_SITE_OTHER): Payer: Medicare Other

## 2017-03-25 DIAGNOSIS — J4550 Severe persistent asthma, uncomplicated: Secondary | ICD-10-CM

## 2017-03-26 ENCOUNTER — Ambulatory Visit: Payer: Medicare Other

## 2017-03-26 MED ORDER — OMALIZUMAB 150 MG ~~LOC~~ SOLR
300.0000 mg | Freq: Once | SUBCUTANEOUS | Status: AC
  Administered 2017-03-25: 300 mg via SUBCUTANEOUS

## 2017-04-09 ENCOUNTER — Ambulatory Visit (INDEPENDENT_AMBULATORY_CARE_PROVIDER_SITE_OTHER): Payer: Medicare Other

## 2017-04-09 DIAGNOSIS — J4550 Severe persistent asthma, uncomplicated: Secondary | ICD-10-CM

## 2017-04-10 ENCOUNTER — Telehealth: Payer: Self-pay | Admitting: Internal Medicine

## 2017-04-13 MED ORDER — OMALIZUMAB 150 MG ~~LOC~~ SOLR
300.0000 mg | Freq: Once | SUBCUTANEOUS | Status: AC
  Administered 2017-04-09: 300 mg via SUBCUTANEOUS

## 2017-04-15 NOTE — Telephone Encounter (Signed)
#   vials:4 Ordered date:04/10/17 Shipping Date:03/16/17  Faxed order 04/10/17, forgot to put it in. I request that Arvid Right come today, [facility] is sending it 03/17/17. Tracking # [id number]

## 2017-04-16 DIAGNOSIS — Z6825 Body mass index (BMI) 25.0-25.9, adult: Secondary | ICD-10-CM | POA: Diagnosis not present

## 2017-04-16 DIAGNOSIS — M810 Age-related osteoporosis without current pathological fracture: Secondary | ICD-10-CM | POA: Diagnosis not present

## 2017-04-16 DIAGNOSIS — M25552 Pain in left hip: Secondary | ICD-10-CM | POA: Diagnosis not present

## 2017-04-16 DIAGNOSIS — R7301 Impaired fasting glucose: Secondary | ICD-10-CM | POA: Diagnosis not present

## 2017-04-16 DIAGNOSIS — I10 Essential (primary) hypertension: Secondary | ICD-10-CM | POA: Diagnosis not present

## 2017-04-16 DIAGNOSIS — G2581 Restless legs syndrome: Secondary | ICD-10-CM | POA: Diagnosis not present

## 2017-04-16 DIAGNOSIS — M25551 Pain in right hip: Secondary | ICD-10-CM | POA: Diagnosis not present

## 2017-04-16 NOTE — Telephone Encounter (Signed)
#   Vials:4 Arrival Date:04/16/17 Lot #:[id number] Exp Date:07/2020

## 2017-04-17 DIAGNOSIS — Z96642 Presence of left artificial hip joint: Secondary | ICD-10-CM | POA: Diagnosis not present

## 2017-04-17 DIAGNOSIS — M7062 Trochanteric bursitis, left hip: Secondary | ICD-10-CM | POA: Diagnosis not present

## 2017-04-17 DIAGNOSIS — M1712 Unilateral primary osteoarthritis, left knee: Secondary | ICD-10-CM | POA: Diagnosis not present

## 2017-04-17 DIAGNOSIS — Z471 Aftercare following joint replacement surgery: Secondary | ICD-10-CM | POA: Diagnosis not present

## 2017-04-23 ENCOUNTER — Ambulatory Visit (INDEPENDENT_AMBULATORY_CARE_PROVIDER_SITE_OTHER): Payer: Medicare Other

## 2017-04-23 ENCOUNTER — Other Ambulatory Visit: Payer: Self-pay | Admitting: Cardiovascular Disease

## 2017-04-23 DIAGNOSIS — J4550 Severe persistent asthma, uncomplicated: Secondary | ICD-10-CM | POA: Diagnosis not present

## 2017-04-24 MED ORDER — OMALIZUMAB 150 MG ~~LOC~~ SOLR
300.0000 mg | Freq: Once | SUBCUTANEOUS | Status: AC
  Administered 2017-04-24: 300 mg via SUBCUTANEOUS

## 2017-05-07 ENCOUNTER — Ambulatory Visit: Payer: Medicare Other

## 2017-05-07 ENCOUNTER — Ambulatory Visit (INDEPENDENT_AMBULATORY_CARE_PROVIDER_SITE_OTHER): Payer: Medicare Other

## 2017-05-07 DIAGNOSIS — J4540 Moderate persistent asthma, uncomplicated: Secondary | ICD-10-CM | POA: Diagnosis not present

## 2017-05-08 MED ORDER — OMALIZUMAB 150 MG ~~LOC~~ SOLR
300.0000 mg | SUBCUTANEOUS | Status: DC
  Administered 2017-05-07: 300 mg via SUBCUTANEOUS

## 2017-05-08 NOTE — Progress Notes (Signed)
Xolair injection documentation and charges entered by Kimarie Coor, RMA, based on injection sheet filled out by Tammy Scott per office protocol.   

## 2017-05-15 ENCOUNTER — Telehealth: Payer: Self-pay | Admitting: Internal Medicine

## 2017-05-15 NOTE — Telephone Encounter (Signed)
#   vials:4 Ordered date:05/15/17 Shipping Date:05/18/17

## 2017-05-18 ENCOUNTER — Telehealth: Payer: Self-pay | Admitting: Internal Medicine

## 2017-05-18 MED ORDER — PREDNISONE 10 MG PO TABS: ORAL_TABLET | ORAL | 0 refills | Status: DC

## 2017-05-18 NOTE — Telephone Encounter (Signed)
Spoke with patient. She is aware of RB's recs. Will go ahead and call in medication to [facility] [place].   Nothing else needed at time of call.

## 2017-05-18 NOTE — Telephone Encounter (Signed)
Called and spoke with pt.  Pt reports of prod cough with clear mucus, chest congestion, wheezing & increased sob x3d. Denies fever, chills, sweats or body aches.  Pt is requesting Rx for prednisone.   RB please advise, as MR is unavailable. Thanks.

## 2017-05-18 NOTE — Telephone Encounter (Signed)
OK to call in pred taper > Take 40mg  daily for 3 days, then 30mg  daily for 3 days, then 20mg  daily for 3 days, then 10mg  daily for 3 days, then stop   Need to notify Dr R when available to insure that he agrees, doesn't want to change this script

## 2017-05-20 NOTE — Telephone Encounter (Signed)
[facility] Called today to resch pt's xoair del.  # vials:4 Ordered date:05/20/17 Shipping Date:05/20/17

## 2017-05-21 ENCOUNTER — Ambulatory Visit (INDEPENDENT_AMBULATORY_CARE_PROVIDER_SITE_OTHER): Payer: Medicare Other

## 2017-05-21 ENCOUNTER — Ambulatory Visit: Payer: Medicare Other | Admitting: Internal Medicine

## 2017-05-21 DIAGNOSIS — J4540 Moderate persistent asthma, uncomplicated: Secondary | ICD-10-CM

## 2017-05-21 NOTE — Telephone Encounter (Signed)
#   Vials:4 Arrival Date:05/21/17 Lot #:[id number] Exp Date:09/2020

## 2017-05-22 DIAGNOSIS — M7062 Trochanteric bursitis, left hip: Secondary | ICD-10-CM | POA: Diagnosis not present

## 2017-05-22 DIAGNOSIS — M48061 Spinal stenosis, lumbar region without neurogenic claudication: Secondary | ICD-10-CM | POA: Diagnosis not present

## 2017-05-22 DIAGNOSIS — M545 Low back pain: Secondary | ICD-10-CM | POA: Diagnosis not present

## 2017-05-22 DIAGNOSIS — Z96642 Presence of left artificial hip joint: Secondary | ICD-10-CM | POA: Diagnosis not present

## 2017-05-22 MED ORDER — OMALIZUMAB 150 MG ~~LOC~~ SOLR
300.0000 mg | SUBCUTANEOUS | Status: DC
  Administered 2017-05-21: 300 mg via SUBCUTANEOUS

## 2017-06-04 ENCOUNTER — Other Ambulatory Visit: Payer: Self-pay | Admitting: Cardiovascular Disease

## 2017-06-04 ENCOUNTER — Ambulatory Visit (INDEPENDENT_AMBULATORY_CARE_PROVIDER_SITE_OTHER): Payer: Medicare Other

## 2017-06-04 DIAGNOSIS — J4540 Moderate persistent asthma, uncomplicated: Secondary | ICD-10-CM | POA: Diagnosis not present

## 2017-06-05 MED ORDER — OMALIZUMAB 150 MG ~~LOC~~ SOLR
300.0000 mg | SUBCUTANEOUS | Status: DC
  Administered 2017-06-04: 300 mg via SUBCUTANEOUS

## 2017-06-05 NOTE — Progress Notes (Signed)
Documentation of medication administration and charges of Xolair have been completed by Elvie Palomo, CMA based on the Xolair documentation sheet completed by Tammy Scott.  

## 2017-06-15 ENCOUNTER — Other Ambulatory Visit: Payer: Self-pay | Admitting: Internal Medicine

## 2017-06-15 ENCOUNTER — Encounter: Payer: Self-pay | Admitting: Internal Medicine

## 2017-06-15 ENCOUNTER — Ambulatory Visit (INDEPENDENT_AMBULATORY_CARE_PROVIDER_SITE_OTHER): Payer: Medicare Other | Admitting: Internal Medicine

## 2017-06-15 VITALS — BP 150/80 | HR 83 | Ht 64.0 in | Wt 153.0 lb

## 2017-06-15 DIAGNOSIS — J4550 Severe persistent asthma, uncomplicated: Secondary | ICD-10-CM | POA: Diagnosis not present

## 2017-06-15 DIAGNOSIS — R590 Localized enlarged lymph nodes: Secondary | ICD-10-CM | POA: Diagnosis not present

## 2017-06-15 DIAGNOSIS — J4541 Moderate persistent asthma with (acute) exacerbation: Secondary | ICD-10-CM

## 2017-06-15 LAB — NITRIC OXIDE: Nitric Oxide: 126

## 2017-06-15 MED ORDER — PREDNISONE 10 MG PO TABS: ORAL_TABLET | ORAL | 0 refills | Status: DC

## 2017-06-15 MED ORDER — BENZONATATE 100 MG PO CAPS: 100.0000 mg | ORAL_CAPSULE | Freq: Three times a day (TID) | ORAL | 1 refills | Status: DC | PRN

## 2017-06-15 MED ORDER — DOXYCYCLINE HYCLATE 100 MG PO TABS: 100.0000 mg | ORAL_TABLET | Freq: Two times a day (BID) | ORAL | 0 refills | Status: DC

## 2017-06-15 MED ORDER — ALBUTEROL SULFATE HFA 108 (90 BASE) MCG/ACT IN AERS: 2.0000 | INHALATION_SPRAY | Freq: Four times a day (QID) | RESPIRATORY_TRACT | 5 refills | Status: DC | PRN

## 2017-06-15 MED ORDER — ESOMEPRAZOLE MAGNESIUM 40 MG PO CPDR: 40.0000 mg | DELAYED_RELEASE_CAPSULE | Freq: Two times a day (BID) | ORAL | 3 refills | Status: DC

## 2017-06-15 NOTE — Progress Notes (Signed)
Subjective:     Patient ID: [name], female   DOB: [birth year], 84 y.o.   MRN: [id number]  HPI   C.C.:  Follow-up for Severe, Persistent Asthma, Chronic Allergic Rhinitis, DVT, & GERD w/ Hiatal Hernia.  HPI Patient has been seen twice in our office since her last appointment with me. Last visit was on 9/26 with nurse practitioner. Patient was treated for an exacerbation of her asthma with bronchitis. She was given a prednisone taper, instructed to use Mucinex twice daily as needed, and a 7 day course of Augmentin. Since her last appointment she fractured her left hip. She is continuing to walk with a cane. She reports no adverse effects from her Xolair. She does feel the Xolair is helping her significantly. Hasn't needed her rescue inhaler significantly in the last few weeks. Only rare nocturnal awakenings with any dyspnea.   Severe, persistent asthma: Patient currently maintained on a regimen of Xolair, Singulair, Spiriva, and Symbicort. She feels her dyspnea has significantly improved. She still has a scratchy throat & hoarse voice. She has her baseline nonproductive cough. No wheezing.  Chronic allergic rhinitis: Previously referred to ENT. Regimen at last appointment with me included Allegra, Singulair, Nasonex, and Xolair. Patient has had multiple different antibiotic courses for recurrent sinusitis. Only having intermittent sinus congestion & drainage.   DVT: Present in bilateral lower extremities. Patient on systemic anticoagulation with Xarelto indefinitely. No melena or hematochezia. No hematuria.   GERD with hiatal hernia: Previously prescribed Nexium. No reflux or dyspepsia. No morning brash water taste.   Review of Systems  No chest pain or tightness. No fever or chills. No abdominal pain or nausea.    OV 06/15/2017  Chief Complaint  Patient presents with  . Follow-up    flare ups with asthma when the weather changes.  Right now it is a cough, with congestions and wheezing.  Non-productive dry cough. SOB with any activity.  Hoarseness.   85 year old female.  Transfer of care from Dr. Ashok Cordia who is left the practice.  Moderate persistent asthma with an elevated IgE.  She is here with her husband.  She tells me that in 2015-2017 she was started on Xolair and then subsequently switched in [place] last year or year before last.  This then caused nonspecific side effect such as erythema in her face and nonspecific pain that reminded her of shingles.  Therefore she discontinued this and the side effects resolved.  She did have 6 doses of the interleukin-5 receptor antibody.  She went back on Xolair.  Overall she feels that since starting Xolair her quality of life and asthma exacerbations have improved but nevertheless she still gets asthma exacerbations every few months and has to go on prednisone.  Currently she states that for the last few to several days she has had increased cough although no change in sputum.  She is also wheezing a little bit more than usual.  Asthma control questionnaire shows for the last week she is waking up a few times at night.  When she wakes up she has moderate symptoms.  Her activities are slightly limited because of asthma.  She is moderate amount of shortness of breath because of asthma and she is wheezing a little of the time and using albuterol for rescue at least 1 or 2 times daily.  Exam nitric oxide is significantly elevated at 125 ppb.  She think she will benefit from prednisone  She she reports to me for the first time a new  issue of left anterior cervical lymph node/submandibular lymph node.  She says it is been present for a while but other physicians were examined it have not felt it.  She asked me to examine it.  She feels it is slowly growing.  Insidious onset for a year.    Asthma Control Panel  10/11/15 - IgE - 425 , rest of blood allergy panel negative. Blood eos 300cells (al;so 300 on 12/19/16)     06/15/2017   Current Med Regimen  Spriva, symbicort,  Singulair, xolair, nexium  ACQ 5 point- 1 week. wtd avg score. <1.0 is good control 0.75-1.25 is grey zone. >1.25 poor control. Delta 0.5 is clinically meaningful   FeNO ppB 126 ppb  FeV1    Planned intervention  for visit        has a past medical history of Asthma, severe persistent, Chronic kidney disease, stage II (mild), DDD (degenerative disc disease), cervical, Diverticulosis, Edema, lower extremity, GERD (gastroesophageal reflux disease), History of breast cancer, History of DVT of lower extremity, History of shingles, Hyperlipidemia, Leg ulcer, left ([place]), Osteoporosis, unspecified, Perennial allergic rhinitis, Restless legs syndrome (RLS), and Unspecified essential hypertension.   reports that  has never smoked. she has never used smokeless tobacco.  Past Surgical History:  Procedure Laterality Date  . CARDIAC CATHETERIZATION  08/ 01/ 2008   dr Cathie Olden   normal -- EF of 65%  . CARDIOVASCULAR STRESS TEST  05-22-2011   dr Cathie Olden   normal lexiscan perfusion study/  no ischemia/  lvsf  86%  . CATARACT EXTRACTION W/ INTRAOCULAR LENS  IMPLANT, BILATERAL    . CHOLECYSTECTOMY  1993  . HIP ARTHROPLASTY Left 12/22/2016   Procedure: HEMIARTHROPLASTY, LEFT;  Surgeon: Paralee Cancel, MD;  Location: [facility];  Service: Orthopedics;  Laterality: Left;  . INCISION AND DRAINAGE OF WOUND Left 10/24/2013   Procedure: IRRIGATION AND DEBRIDEMENT OF LEFT LEG WITH PLACEMENT OF ACELL AND WOUND VAC;  Surgeon: Theodoro Kos, DO;  Location: [facility];  Service: Plastics;  Laterality: Left;  . [place]  . MASTECTOMY Bilateral rigth 1989///   left  1990   breast cancer 1989//   fibrocytic disease 1990  . TOTAL ABDOMINAL HYSTERECTOMY W/ BILATERAL SALPINGOOPHORECTOMY  1989   done at same time as right mastectomy  . TOTAL KNEE ARTHROPLASTY Right 10/05/2012   Procedure: RIGHT TOTAL KNEE ARTHROPLASTY;  Surgeon: Mauri Pole, MD;  Location: [facility];   Service: Orthopedics;  Laterality: Right;    Allergies  Allergen Reactions  . Eggs Or Egg-Derived Products     RAW EGGS.. Can eat cooked eggs  . Influenza Vaccines   . Nucala [Mepolizumab]   . Cephalosporins Rash  . Codeine Rash  . Levofloxacin Rash  . Pneumococcal Vaccines Rash    Immunization History  Administered Date(s) Administered  . Pneumococcal Conjugate-13 09/14/2012  . Pneumococcal Polysaccharide-23 01/31/2009    Family History  Problem Relation Age of Onset  . Heart attack Mother   . Asthma Mother   . Heart disease Mother   . Hyperlipidemia Mother   . Heart attack Father   . Heart failure Father   . Deep vein thrombosis Father   . Heart disease Father   . Peripheral vascular disease Father        amputation  . Cancer Brother   . Asthma Brother   . Coronary artery disease Sister   . Heart disease Sister      Current  Outpatient Medications:  .  dextromethorphan-guaiFENesin (MUCINEX DM) 30-600 MG 12hr tablet, Take 1 tablet by mouth daily as needed for cough., Disp: , Rfl:  .  acetaminophen (TYLENOL) 500 MG tablet, Take 500 mg by mouth every 6 (six) hours as needed for mild pain or moderate pain., Disp: , Rfl:  .  albuterol (PROAIR HFA) 108 (90 Base) MCG/ACT inhaler, Inhale 2 puffs into the lungs every 6 (six) hours as needed for wheezing or shortness of breath., Disp: 18 g, Rfl: 5 .  albuterol (PROVENTIL) (2.5 MG/3ML) 0.083% nebulizer solution, Take 3 mLs (2.5 mg total) by nebulization every 6 (six) hours as needed for wheezing or shortness of breath. Dx: J45.41, Disp: 360 mL, Rfl: 6 .  benzonatate (TESSALON) 100 MG capsule, TAKE 1 CAPSULE (100 MG TOTAL) BY MOUTH 3 (THREE) TIMES DAILY AS NEEDED FOR COUGH., Disp: 100 capsule, Rfl: 1 .  Calcium Carbonate-Vitamin D (CALTRATE 600+D) 600-400 MG-UNIT per tablet, Take 1 tablet by mouth daily. , Disp: , Rfl:  .  chlorpheniramine (CHLOR-TRIMETON) 4 MG tablet, Take by mouth. 4 mg in the morning and 8 mg at bedtime, Disp:  , Rfl:  .  Cholecalciferol (VITAMIN D3) 1000 UNITS tablet, Take 1,000 Units by mouth daily.  , Disp: , Rfl:  .  dextromethorphan (DELSYM) 30 MG/5ML liquid, Take 60 mg by mouth 2 (two) times daily as needed for cough., Disp: , Rfl:  .  esomeprazole (NEXIUM) 40 MG capsule, Take 1 capsule (40 mg total) by mouth 2 (two) times daily. Name brand only (Patient taking differently: Take 40 mg by mouth every evening. Name brand only), Disp: 180 capsule, Rfl: 3 .  fexofenadine (ALLEGRA) 180 MG tablet, Take 180 mg by mouth daily.  , Disp: , Rfl:  .  furosemide (LASIX) 20 MG tablet, Take 1 tablet (20 mg total) daily as needed by mouth for fluid or edema., Disp: 90 tablet, Rfl: 3 .  mometasone (NASONEX) 50 MCG/ACT nasal spray, Place 2 sprays into the nose daily., Disp: 51 g, Rfl: 11 .  montelukast (SINGULAIR) 10 MG tablet, Take 1 tablet (10 mg total) by mouth at bedtime., Disp: 30 tablet, Rfl: 5 .  Multiple Vitamin (MULTIVITAMIN) tablet, Take 1 tablet by mouth daily., Disp: , Rfl:  .  pramipexole (MIRAPEX) 0.125 MG tablet, Take 0.375 mg by mouth at bedtime. , Disp: , Rfl:  .  rosuvastatin (CRESTOR) 5 MG tablet, TAKE 1 TABLET BY MOUTH EVERYDAY AT BEDTIME, Disp: 90 tablet, Rfl: 3 .  Spacer/Aero-Holding Chambers (AEROCHAMBER MV) inhaler, by Other route. Use as instructed , Disp: , Rfl:  .  SYMBICORT 160-4.5 MCG/ACT inhaler, INHALE 2 PUFFS TWICE A DAY, Disp: 30.6 Inhaler, Rfl: 1 .  telmisartan (MICARDIS) 20 MG tablet, TAKE 1 TABLET BY MOUTH AT BEDTIME, Disp: 90 tablet, Rfl: 3 .  Tiotropium Bromide Monohydrate (SPIRIVA RESPIMAT) 2.5 MCG/ACT AERS, Inhale 2 puffs into the lungs daily., Disp: 3 Inhaler, Rfl: 3 .  vitamin B-12 (CYANOCOBALAMIN) 500 MCG tablet, Take 500 mcg by mouth daily.  , Disp: , Rfl:  .  XARELTO 20 MG TABS tablet, Take 20 mg by mouth daily. , Disp: , Rfl:   Current Facility-Administered Medications:  .  omalizumab Arvid Right) injection 300 mg, 300 mg, Subcutaneous, Q14 Days, Javier Glazier, MD, 300  mg at 03/11/17 0905 .  omalizumab Arvid Right) injection 300 mg, 300 mg, Subcutaneous, Q14 Days, Chrishaun Sasso, MD, 300 mg at 05/07/17 0920 .  omalizumab Arvid Right) injection 300 mg, 300 mg, Subcutaneous, Q14 Days, Malcolm Hetz,  MD, 300 mg at 05/21/17 0901 .  omalizumab Arvid Right) injection 300 mg, 300 mg, Subcutaneous, Q14 Days, Holden Maniscalco, MD, 300 mg at 06/04/17 1610   Review of Systems     Objective:   Physical Exam  Constitutional: She is oriented to person, place, and time. She appears well-developed and well-nourished. No distress.  HENT:  Head: Normocephalic and atraumatic.  Right Ear: External ear normal.  Left Ear: External ear normal.  Mouth/Throat: Oropharynx is clear and moist. No oropharyngeal exudate.  LEft 1cm submandibular/left ant cervical node +  Eyes: Conjunctivae and EOM are normal. Pupils are equal, round, and reactive to light. Right eye exhibits no discharge. Left eye exhibits no discharge. No scleral icterus.  Neck: Normal range of motion. Neck supple. No JVD present. No tracheal deviation present. No thyromegaly present.  Cardiovascular: Normal rate, regular rhythm, normal heart sounds and intact distal pulses. Exam reveals no gallop and no friction rub.  No murmur heard. Pulmonary/Chest: Effort normal and breath sounds normal. No respiratory distress. She has no wheezes. She has no rales. She exhibits no tenderness.  Abdominal: Soft. Bowel sounds are normal. She exhibits no distension and no mass. There is no tenderness. There is no rebound and no guarding.  Musculoskeletal: Normal range of motion. She exhibits no edema or tenderness.  Lymphadenopathy:    She has no cervical adenopathy.  Neurological: She is alert and oriented to person, place, and time. She has normal reflexes. No cranial nerve deficit. She exhibits normal muscle tone. Coordination normal.  Skin: Skin is warm and dry. No rash noted. She is not diaphoretic. No erythema. No pallor.   Psychiatric: She has a normal mood and affect. Her behavior is normal. Judgment and thought content normal.  Vitals reviewed.  Vitals:   06/15/17 0906 06/15/17 0908  BP:  (!) 150/80  Pulse:  83  SpO2:  98%  Weight: 153 lb (69.4 kg)   Height: 5\' 4"  (1.626 m)     Estimated body mass index is 26.26 kg/m as calculated from the following:   Height as of this encounter: 5\' 4"  (1.626 m).   Weight as of this encounter: 153 lb (69.4 kg).       Assessment:       ICD-10-CM   1. Moderate persistent asthma with acute exacerbation J45.41   2. Severe persistent asthma without complication R60.45 Nitric oxide       Plan:       Take prednisone 40 mg daily x 2 days, then 20mg  daily x 2 days, then 10mg  daily x 2 days, then 5mg  daily x 2 days and stop  Take doxycycline 100mg  po twice daily x 5 days; take after meals and avoid sunlight   Continue spiriva, symbicort, singulair and nexium schedule; refil nexium Use albuterol as needed Hold next dose xoalir later this week if you are still unwell but if feeling ok then ok to take xolair Take product sheet on dupulimab and we can discuss this next visit  We will get ultrasound of the left sided lymph node in the neck to confirm or refute the findings   Followup 2 months or sooner if needed  - aCQ and feno at followup    Dr. Brand Males, M.D., [facility]C.P Pulmonary and Critical Care Medicine Staff Physician, [facility] Director - Interstitial Lung Disease  Program  P[facility] [place], [place], [address]  Pager: [phone], If no answer or between  15:00h - 7:00h: call [phone]  [phone] Telephone: [phone]

## 2017-06-15 NOTE — Patient Instructions (Addendum)
ICD-10-CM   1. Moderate persistent asthma with acute exacerbation J45.41   2. Severe persistent asthma without complication X10.62 Nitric oxide  3. Left cervical lymphadenopathy R59.0      Take prednisone 40 mg daily x 2 days, then 20mg  daily x 2 days, then 10mg  daily x 2 days, then 5mg  daily x 2 days and stop  Take doxycycline 100mg  po twice daily x 5 days; take after meals and avoid sunlight   Continue spiriva, symbicort, singulair and nexium schedule; refil nexium Use albuterol as needed Hold next dose xoalir later this week if you are still unwell but if feeling ok then ok to take xolair Take product sheet on dupulimab and we can discuss this next visit  Get Ultrasound of left upper  neck to look at lymphnode  - if abnormal, then PCP Crist Infante, MD to deal with issue  Followup 2 months or sooner if needed  - aCQ and feno at followup

## 2017-06-15 NOTE — Progress Notes (Signed)
predn

## 2017-06-16 ENCOUNTER — Telehealth: Payer: Self-pay | Admitting: Internal Medicine

## 2017-06-16 NOTE — Telephone Encounter (Signed)
#   vials:4 Ordered date:06/16/17 Shipping Date:06/16/17 Called pt. To give her [facility] # so she can take care of her co-pay and then they can del. Her med. 10/16/17.  Pt. Saw MR earlier this wk., he told her if she wasn't feeling better Thurs. Not to come in for her injection. I want xolair to be here in case pt is able to come in.

## 2017-06-17 NOTE — Telephone Encounter (Signed)
#   Vials:4 Arrival Date:06/17/17 Lot #:[id number] Exp Date:08/2020

## 2017-06-18 ENCOUNTER — Ambulatory Visit: Payer: Medicare Other

## 2017-06-19 ENCOUNTER — Ambulatory Visit (HOSPITAL_COMMUNITY)
Admission: RE | Admit: 2017-06-19 | Discharge: 2017-06-19 | Disposition: A | Discharge status: Home / Self Care | Payer: Medicare Other | Source: Ambulatory Visit | Attending: Internal Medicine | Admitting: Internal Medicine

## 2017-06-19 DIAGNOSIS — R221 Localized swelling, mass and lump, neck: Secondary | ICD-10-CM | POA: Diagnosis not present

## 2017-06-19 DIAGNOSIS — R590 Localized enlarged lymph nodes: Secondary | ICD-10-CM | POA: Diagnosis not present

## 2017-06-22 ENCOUNTER — Ambulatory Visit (INDEPENDENT_AMBULATORY_CARE_PROVIDER_SITE_OTHER): Payer: Medicare Other

## 2017-06-22 DIAGNOSIS — J4550 Severe persistent asthma, uncomplicated: Secondary | ICD-10-CM

## 2017-06-23 MED ORDER — OMALIZUMAB 150 MG ~~LOC~~ SOLR
300.0000 mg | Freq: Once | SUBCUTANEOUS | Status: AC
Start: 2017-06-22 — End: 2017-06-22
  Administered 2017-06-22: 300 mg via SUBCUTANEOUS

## 2017-07-03 DIAGNOSIS — M48061 Spinal stenosis, lumbar region without neurogenic claudication: Secondary | ICD-10-CM | POA: Diagnosis not present

## 2017-07-03 DIAGNOSIS — M5136 Other intervertebral disc degeneration, lumbar region: Secondary | ICD-10-CM | POA: Diagnosis not present

## 2017-07-03 DIAGNOSIS — M25552 Pain in left hip: Secondary | ICD-10-CM | POA: Diagnosis not present

## 2017-07-03 DIAGNOSIS — M5410 Radiculopathy, site unspecified: Secondary | ICD-10-CM | POA: Diagnosis not present

## 2017-07-03 DIAGNOSIS — Z96642 Presence of left artificial hip joint: Secondary | ICD-10-CM | POA: Diagnosis not present

## 2017-07-06 ENCOUNTER — Ambulatory Visit (INDEPENDENT_AMBULATORY_CARE_PROVIDER_SITE_OTHER): Payer: Medicare Other

## 2017-07-06 DIAGNOSIS — J4550 Severe persistent asthma, uncomplicated: Secondary | ICD-10-CM

## 2017-07-07 MED ORDER — OMALIZUMAB 150 MG ~~LOC~~ SOLR
300.0000 mg | Freq: Once | SUBCUTANEOUS | Status: AC
  Administered 2017-07-06: 300 mg via SUBCUTANEOUS

## 2017-07-09 ENCOUNTER — Other Ambulatory Visit: Payer: Self-pay | Admitting: Cardiovascular Disease

## 2017-07-16 ENCOUNTER — Telehealth: Payer: Self-pay | Admitting: Internal Medicine

## 2017-07-16 DIAGNOSIS — M48061 Spinal stenosis, lumbar region without neurogenic claudication: Secondary | ICD-10-CM | POA: Diagnosis not present

## 2017-07-16 NOTE — Telephone Encounter (Signed)
Called xolair rx into [facility] rep. Said that was all I needed to do. When I tried to order xolair, I the  rep. Said they need a p/a. I called pt. And made her aware her med. Won't be here Mon. Doing P/A now.

## 2017-07-20 ENCOUNTER — Ambulatory Visit: Payer: Medicare Other

## 2017-07-23 ENCOUNTER — Telehealth: Payer: Self-pay | Admitting: Internal Medicine

## 2017-07-23 NOTE — Telephone Encounter (Signed)
#   vials:4 Ordered date:07/23/2017 Shipping Date:07/23/2917 I asked to transferred to pharmacy to order syringes next order.

## 2017-07-24 NOTE — Telephone Encounter (Signed)
#   Vials:4 Arrival Date:07/24/17 Lot #:[id number] Exp Date:10/2020

## 2017-07-27 ENCOUNTER — Ambulatory Visit: Payer: Medicare Other

## 2017-07-27 ENCOUNTER — Ambulatory Visit (INDEPENDENT_AMBULATORY_CARE_PROVIDER_SITE_OTHER): Payer: Medicare Other

## 2017-07-27 DIAGNOSIS — J4550 Severe persistent asthma, uncomplicated: Secondary | ICD-10-CM | POA: Diagnosis not present

## 2017-07-28 MED ORDER — OMALIZUMAB 150 MG ~~LOC~~ SOLR
300.0000 mg | SUBCUTANEOUS | Status: DC
  Administered 2017-07-27: 300 mg via SUBCUTANEOUS

## 2017-07-28 NOTE — Progress Notes (Signed)
Documentation of medication administration and charges of Xolair have been completed by Lindsay Lemons, CMA based on the Xolair documentation sheet completed by Tammy Scott.  

## 2017-08-06 NOTE — Telephone Encounter (Signed)
P/A was approved: 04/29/2017 thru 07/28/2018. Nothing further needed.

## 2017-08-13 ENCOUNTER — Encounter: Payer: Self-pay | Admitting: Internal Medicine

## 2017-08-13 ENCOUNTER — Ambulatory Visit (INDEPENDENT_AMBULATORY_CARE_PROVIDER_SITE_OTHER): Payer: Medicare Other | Admitting: Internal Medicine

## 2017-08-13 ENCOUNTER — Ambulatory Visit (INDEPENDENT_AMBULATORY_CARE_PROVIDER_SITE_OTHER): Payer: Medicare Other

## 2017-08-13 VITALS — BP 122/78 | HR 100 | Ht 63.0 in | Wt 151.8 lb

## 2017-08-13 DIAGNOSIS — R0602 Shortness of breath: Secondary | ICD-10-CM | POA: Diagnosis not present

## 2017-08-13 DIAGNOSIS — R05 Cough: Secondary | ICD-10-CM | POA: Diagnosis not present

## 2017-08-13 DIAGNOSIS — J4550 Severe persistent asthma, uncomplicated: Secondary | ICD-10-CM | POA: Diagnosis not present

## 2017-08-13 DIAGNOSIS — J0191 Acute recurrent sinusitis, unspecified: Secondary | ICD-10-CM

## 2017-08-13 DIAGNOSIS — J4540 Moderate persistent asthma, uncomplicated: Secondary | ICD-10-CM

## 2017-08-13 DIAGNOSIS — R059 Cough, unspecified: Secondary | ICD-10-CM

## 2017-08-13 LAB — NITRIC OXIDE: NITRIC OXIDE: 104

## 2017-08-13 MED ORDER — AMOXICILLIN-POT CLAVULANATE 875-125 MG PO TABS: 1.0000 | ORAL_TABLET | Freq: Two times a day (BID) | ORAL | 0 refills | Status: DC

## 2017-08-13 MED ORDER — PREDNISONE 10 MG PO TABS: ORAL_TABLET | ORAL | 0 refills | Status: DC

## 2017-08-13 NOTE — Addendum Note (Signed)
Addended by: Lorretta Harp on: 08/13/2017 09:32 AM   Modules accepted: Orders

## 2017-08-13 NOTE — Progress Notes (Signed)
Subjective:     Patient ID: [name], female   DOB: [birth year], 84 y.o.   MRN: [id number]  HPI   C.C.:  Follow-up for Severe, Persistent Asthma, Chronic Allergic Rhinitis, DVT, & GERD w/ Hiatal Hernia.  HPI Patient has been seen twice in our office since her last appointment with me. Last visit was on 9/26 with nurse practitioner. Patient was treated for an exacerbation of her asthma with bronchitis. She was given a prednisone taper, instructed to use Mucinex twice daily as needed, and a 7 day course of Augmentin. Since her last appointment she fractured her left hip. She is continuing to walk with a cane. She reports no adverse effects from her Xolair. She does feel the Xolair is helping her significantly. Hasn't needed her rescue inhaler significantly in the last few weeks. Only rare nocturnal awakenings with any dyspnea.   Severe, persistent asthma: Patient currently maintained on a regimen of Xolair, Singulair, Spiriva, and Symbicort. She feels her dyspnea has significantly improved. She still has a scratchy throat & hoarse voice. She has her baseline nonproductive cough. No wheezing.  Chronic allergic rhinitis: Previously referred to ENT. Regimen at last appointment with me included Allegra, Singulair, Nasonex, and Xolair. Patient has had multiple different antibiotic courses for recurrent sinusitis. Only having intermittent sinus congestion & drainage.   DVT: Present in bilateral lower extremities. Patient on systemic anticoagulation with Xarelto indefinitely. No melena or hematochezia. No hematuria.   GERD with hiatal hernia: Previously prescribed Nexium. No reflux or dyspepsia. No morning brash water taste.   Review of Systems  No chest pain or tightness. No fever or chills. No abdominal pain or nausea.    OV 06/15/2017  Chief Complaint  Patient presents with  . Follow-up    flare ups with asthma when the weather changes.  Right now it is a cough, with congestions and wheezing.  Non-productive dry cough. SOB with any activity.  Hoarseness.   84 year old female.  Transfer of care from Dr. Ashok Cordia who is left the practice.  Moderate persistent asthma with an elevated IgE.  She is here with her husband.  She tells me that in 2015-2017 she was started on Xolair and then subsequently switched in [place] last year or year before last.  This then caused nonspecific side effect such as erythema in her face and nonspecific pain that reminded her of shingles.  Therefore she discontinued this and the side effects resolved.  She did have 6 doses of the interleukin-5 receptor antibody.  She went back on Xolair.  Overall she feels that since starting Xolair her quality of life and asthma exacerbations have improved but nevertheless she still gets asthma exacerbations every few months and has to go on prednisone.  Currently she states that for the last few to several days she has had increased cough although no change in sputum.  She is also wheezing a little bit more than usual.  Asthma control questionnaire shows for the last week she is waking up a few times at night.  When she wakes up she has moderate symptoms.  Her activities are slightly limited because of asthma.  She is moderate amount of shortness of breath because of asthma and she is wheezing a little of the time and using albuterol for rescue at least 1 or 2 times daily.  Exam nitric oxide is significantly elevated at 125 ppb.  She think she will benefit from prednisone  She she reports to me for the first time a new  issue of left anterior cervical lymph node/submandibular lymph node.  She says it is been present for a while but other physicians were examined it have not felt it.  She asked me to examine it.  She feels it is slowly growing.  Insidious onset for a year.   OV 08/13/2017  Chief Complaint  Patient presents with  . Follow-up    Pt states she has had many flare-ups with her asthma. States she has had a lot of sinus drainage  and congestion and a lot of chest tightness.    84 year old female with complicated asthma   poor control of asthma continues.  At my last visit she did a prednisone taper.  Then a few weeks ago her sinuses acted up and therefore her asthma acted up and she did another prednisone taper on herself at home but it was just for a few days.  She continues on Spiriva, Symbicort, Singulair, Nexium and Xolair injections.  She feels that ever since Dr. Asencion Noble retired her asthma has been out of control.  Currently for the last few weeks her sinuses have been acting up and she wants antibiotics.  In particular she wants Augmentin.  She says in the past this was deemed as an allergy but she went and saw some doctor in [place] and it looks like she has been desensitized.  Penicillin is no longer listed as an allergy.  She insists that she wants Augmentin.  Review of the chart shows she has had chronic sinusitis and a CT scan of the sinus 1 year ago in February 2018.  She says she has seen ENT for this.  No surgery has been recommended.  Just nasal hygiene and antibiotic courses.  Currently her symptoms are quite significant with asthma control question of greater than 2 it appears this might all just be her baseline.  Her exam nitric oxide is still high over 100.  At night she is waking up several times.  When she wakes up she has moderate symptoms.  Activities are slightly limited because of asthma.  She has moderate amount of shortness of breath because of asthma.  She is wheezing hardly and she uses albuterol for rescue at least 1 or 2 times daily.  Asthma Control Panel  10/11/15 - IgE - 425 , rest of blood allergy panel negative. Blood eos 300cells (al;so 300 on 12/19/16). CT Sinus feb 2018 - chronic pan sinusitis     07/02/2016 06/15/2017  08/13/2017   Current Med Regimen  Spriva, symbicort,  Singulair, xolair, nexium same  ACQ 5 point- 1 week. wtd avg score. <1.0 is good control 0.75-1.25 is grey  zone. >1.25 poor control. Delta 0.5 is clinically meaningful   2.4  FeNO ppB  126 ppb 104 ppb  FeV1  1.777/90%, ratio 74,     Planned intervention  for visit   HRCT, talk to eye doc, augmenting, prednsione,        has a past medical history of Asthma, severe persistent, Chronic kidney disease, stage II (mild), DDD (degenerative disc disease), cervical, Diverticulosis, Edema, lower extremity, GERD (gastroesophageal reflux disease), History of breast cancer, History of DVT of lower extremity, History of shingles, Hyperlipidemia, Leg ulcer, left ([place]), Osteoporosis, unspecified, Perennial allergic rhinitis, Restless legs syndrome (RLS), and Unspecified essential hypertension.   reports that she has never smoked. She has never used smokeless tobacco.  Past Surgical History:  Procedure Laterality Date  . CARDIAC CATHETERIZATION  08/ 01/ 2008   dr Cathie Olden  normal -- EF of 65%  . CARDIOVASCULAR STRESS TEST  05-22-2011   dr Cathie Olden   normal lexiscan perfusion study/  no ischemia/  lvsf  86%  . CATARACT EXTRACTION W/ INTRAOCULAR LENS  IMPLANT, BILATERAL    . CHOLECYSTECTOMY  1993  . HIP ARTHROPLASTY Left 12/22/2016   Procedure: HEMIARTHROPLASTY, LEFT;  Surgeon: Paralee Cancel, MD;  Location: [facility];  Service: Orthopedics;  Laterality: Left;  . INCISION AND DRAINAGE OF WOUND Left 10/24/2013   Procedure: IRRIGATION AND DEBRIDEMENT OF LEFT LEG WITH PLACEMENT OF ACELL AND WOUND VAC;  Surgeon: Theodoro Kos, DO;  Location: [facility];  Service: Plastics;  Laterality: Left;  . [place]  . MASTECTOMY Bilateral rigth 1989///   left  1990   breast cancer 1989//   fibrocytic disease 1990  . TOTAL ABDOMINAL HYSTERECTOMY W/ BILATERAL SALPINGOOPHORECTOMY  1989   done at same time as right mastectomy  . TOTAL KNEE ARTHROPLASTY Right 10/05/2012   Procedure: RIGHT TOTAL KNEE ARTHROPLASTY;  Surgeon: Mauri Pole, MD;  Location: [facility];  Service: Orthopedics;  Laterality:  Right;    Allergies  Allergen Reactions  . Eggs Or Egg-Derived Products     RAW EGGS.. Can eat cooked eggs  . Influenza Vaccines   . Nucala [Mepolizumab]   . Cephalosporins Rash  . Codeine Rash  . Levofloxacin Rash  . Pneumococcal Vaccines Rash    Immunization History  Administered Date(s) Administered  . Pneumococcal Conjugate-13 09/14/2012  . Pneumococcal Polysaccharide-23 01/31/2009    Family History  Problem Relation Age of Onset  . Heart attack Mother   . Asthma Mother   . Heart disease Mother   . Hyperlipidemia Mother   . Heart attack Father   . Heart failure Father   . Deep vein thrombosis Father   . Heart disease Father   . Peripheral vascular disease Father        amputation  . Cancer Brother   . Asthma Brother   . Coronary artery disease Sister   . Heart disease Sister      Current Outpatient Medications:  .  acetaminophen (TYLENOL) 500 MG tablet, Take 500 mg by mouth every 6 (six) hours as needed for mild pain or moderate pain., Disp: , Rfl:  .  albuterol (PROAIR HFA) 108 (90 Base) MCG/ACT inhaler, Inhale 2 puffs into the lungs every 6 (six) hours as needed for wheezing or shortness of breath., Disp: 18 g, Rfl: 5 .  albuterol (PROVENTIL) (2.5 MG/3ML) 0.083% nebulizer solution, Take 3 mLs (2.5 mg total) by nebulization every 6 (six) hours as needed for wheezing or shortness of breath. Dx: J45.41, Disp: 360 mL, Rfl: 6 .  benzonatate (TESSALON) 100 MG capsule, Take 1 capsule (100 mg total) by mouth 3 (three) times daily as needed for cough., Disp: 100 capsule, Rfl: 1 .  Calcium Carbonate-Vitamin D (CALTRATE 600+D) 600-400 MG-UNIT per tablet, Take 1 tablet by mouth daily. , Disp: , Rfl:  .  chlorpheniramine (CHLOR-TRIMETON) 4 MG tablet, Take by mouth. 4 mg in the morning and 8 mg at bedtime, Disp: , Rfl:  .  Cholecalciferol (VITAMIN D3) 1000 UNITS tablet, Take 1,000 Units by mouth daily.  , Disp: , Rfl:  .  dextromethorphan (DELSYM) 30 MG/5ML liquid, Take 60 mg  by mouth 2 (two) times daily as needed for cough., Disp: , Rfl:  .  dextromethorphan-guaiFENesin (MUCINEX DM) 30-600 MG 12hr tablet, Take 1 tablet by mouth daily as  needed for cough., Disp: , Rfl:  .  esomeprazole (NEXIUM) 40 MG capsule, Take 1 capsule (40 mg total) by mouth 2 (two) times daily. Name brand only, Disp: 180 capsule, Rfl: 3 .  fexofenadine (ALLEGRA) 180 MG tablet, Take 180 mg by mouth daily.  , Disp: , Rfl:  .  furosemide (LASIX) 20 MG tablet, Take 1 tablet (20 mg total) daily as needed by mouth for fluid or edema., Disp: 90 tablet, Rfl: 3 .  KLOR-CON 10 10 MEQ tablet, TAKE 1 TABLET BY MOUTH EVERY DAY, Disp: 90 tablet, Rfl: 2 .  mometasone (NASONEX) 50 MCG/ACT nasal spray, Place 2 sprays into the nose daily., Disp: 51 g, Rfl: 11 .  montelukast (SINGULAIR) 10 MG tablet, Take 1 tablet (10 mg total) by mouth at bedtime., Disp: 30 tablet, Rfl: 5 .  Multiple Vitamin (MULTIVITAMIN) tablet, Take 1 tablet by mouth daily., Disp: , Rfl:  .  pramipexole (MIRAPEX) 0.125 MG tablet, Take 0.375 mg by mouth at bedtime. , Disp: , Rfl:  .  rosuvastatin (CRESTOR) 5 MG tablet, TAKE 1 TABLET BY MOUTH EVERYDAY AT BEDTIME, Disp: 90 tablet, Rfl: 3 .  Spacer/Aero-Holding Chambers (AEROCHAMBER MV) inhaler, by Other route. Use as instructed , Disp: , Rfl:  .  SYMBICORT 160-4.5 MCG/ACT inhaler, INHALE 2 PUFFS TWICE A DAY, Disp: 30.6 Inhaler, Rfl: 1 .  telmisartan (MICARDIS) 20 MG tablet, TAKE 1 TABLET BY MOUTH AT BEDTIME, Disp: 90 tablet, Rfl: 3 .  Tiotropium Bromide Monohydrate (SPIRIVA RESPIMAT) 2.5 MCG/ACT AERS, Inhale 2 puffs into the lungs daily., Disp: 3 Inhaler, Rfl: 3 .  vitamin B-12 (CYANOCOBALAMIN) 500 MCG tablet, Take 500 mcg by mouth daily.  , Disp: , Rfl:  .  XARELTO 20 MG TABS tablet, Take 20 mg by mouth daily. , Disp: , Rfl:   Current Facility-Administered Medications:  .  omalizumab Arvid Right) injection 300 mg, 300 mg, Subcutaneous, Q14 Days, Caydyn Sprung, MD, 300 mg at 07/27/17  1013   Review of Systems     Objective:   Physical Exam  Constitutional: She is oriented to person, place, and time. She appears well-developed and well-nourished. No distress.  HENT:  Head: Normocephalic and atraumatic.  Right Ear: External ear normal.  Left Ear: External ear normal.  Mouth/Throat: Oropharynx is clear and moist. No oropharyngeal exudate.  nsal twang  Eyes: Pupils are equal, round, and reactive to light. Conjunctivae and EOM are normal. Right eye exhibits no discharge. Left eye exhibits no discharge. No scleral icterus.  Neck: Normal range of motion. Neck supple. No JVD present. No tracheal deviation present. No thyromegaly present.  Cardiovascular: Normal rate, regular rhythm, normal heart sounds and intact distal pulses. Exam reveals no gallop and no friction rub.  No murmur heard. Pulmonary/Chest: Effort normal and breath sounds normal. No respiratory distress. She has no wheezes. She has no rales. She exhibits no tenderness.  Some forced exp wheeze anteriorly  Abdominal: Soft. Bowel sounds are normal. She exhibits no distension and no mass. There is no tenderness. There is no rebound and no guarding.  Musculoskeletal: Normal range of motion. She exhibits no edema or tenderness.  Lymphadenopathy:    She has no cervical adenopathy.  Neurological: She is alert and oriented to person, place, and time. She has normal reflexes. No cranial nerve deficit. She exhibits normal muscle tone. Coordination normal.  Skin: Skin is warm and dry. No rash noted. She is not diaphoretic. No erythema. No pallor.  Psychiatric: She has a normal mood and affect. Her  behavior is normal. Judgment and thought content normal.  Vitals reviewed.  Vitals:   08/13/17 0856  BP: 122/78  Pulse: 100  SpO2: 94%  Weight: 151 lb 12.8 oz (68.9 kg)  Height: 5\' 3"  (1.6 m)    Estimated body mass index is 26.89 kg/m as calculated from the following:   Height as of this encounter: 5\' 3"  (1.6 m).    Weight as of this encounter: 151 lb 12.8 oz (68.9 kg).     Assessment:       ICD-10-CM   1. Acute recurrent sinusitis, unspecified location J01.91   2. Asthma, moderate persistent, poorly-controlled J45.40        Plan:      For sinusitis Augmenting 875mg  twice daily x 8 days Take prednisone 40 mg daily x 2 days, then 20mg  daily x 2 days, then 10mg  daily x 2 days, then 5mg  daily x 2 days and stop  For asthma DO HRCT supine and prone for dyspnea and complicated asthma - will call with results Continue xolair, spiriva , singulair, nasal steroid and nexium as before Will d/w Dr Katy Fitch if ok for Dupulimab in setting of fuchs dystrophy (warning label says can make conjunctivitis or keratitis worse)   Follouwp 3 months or sooner if  Needed; ACQ and feno at followup      Dr. Brand Males, M.D., [facility].C.P Pulmonary and Critical Care Medicine Staff Physician, [facility] Director - Interstitial Lung Disease  Program  Pulmonary [place] at [place], [place], [address]  Pager: [phone], If no answer or between  15:00h - 7:00h: call [phone] Telephone: [phone]

## 2017-08-13 NOTE — Patient Instructions (Addendum)
ICD-10-CM   1. Acute recurrent sinusitis, unspecified location J01.91   2. Asthma, moderate persistent, poorly-controlled J45.40     For sinusitis Augmenting 875mg  twice daily x 8 days Take prednisone 40 mg daily x 2 days, then 20mg  daily x 2 days, then 10mg  daily x 2 days, then 5mg  daily x 2 days and stop  For asthma DO HRCT supine and prone for dyspnea and complicated asthma - will call with results Continue xolair, spiriva , singulair, nasal steroid and nexium as before Will d/w Dr Katy Fitch if ok for Dupulimab in setting of fuchs dystrophy (warning label says can make conjunctivitis or keratitis worse)   Follouwp 3 months or sooner if  Needed; ACQ and feno at followup

## 2017-08-14 DIAGNOSIS — M5136 Other intervertebral disc degeneration, lumbar region: Secondary | ICD-10-CM | POA: Diagnosis not present

## 2017-08-14 DIAGNOSIS — M25552 Pain in left hip: Secondary | ICD-10-CM | POA: Diagnosis not present

## 2017-08-14 DIAGNOSIS — M5410 Radiculopathy, site unspecified: Secondary | ICD-10-CM | POA: Diagnosis not present

## 2017-08-14 MED ORDER — OMALIZUMAB 150 MG ~~LOC~~ SOLR
300.0000 mg | SUBCUTANEOUS | Status: DC
Start: 2017-08-13 — End: 2020-08-15
  Administered 2017-08-13: 300 mg via SUBCUTANEOUS

## 2017-08-14 NOTE — Progress Notes (Signed)
Documentation of medication administration and charges of Xolair have been completed by Lindsay Lemons, CMA based on the Xolair documentation sheet completed by Tammy Scott.  

## 2017-08-17 ENCOUNTER — Telehealth: Payer: Self-pay | Admitting: Internal Medicine

## 2017-08-17 ENCOUNTER — Ambulatory Visit (HOSPITAL_BASED_OUTPATIENT_CLINIC_OR_DEPARTMENT_OTHER)
Admission: RE | Admit: 2017-08-17 | Discharge: 2017-08-17 | Disposition: A | Discharge status: Home / Self Care | Payer: Medicare Other | Source: Ambulatory Visit | Attending: Internal Medicine | Admitting: Internal Medicine

## 2017-08-17 DIAGNOSIS — I2510 Atherosclerotic heart disease of native coronary artery without angina pectoris: Secondary | ICD-10-CM | POA: Diagnosis not present

## 2017-08-17 DIAGNOSIS — R05 Cough: Secondary | ICD-10-CM | POA: Diagnosis not present

## 2017-08-17 DIAGNOSIS — E041 Nontoxic single thyroid nodule: Secondary | ICD-10-CM | POA: Diagnosis not present

## 2017-08-17 DIAGNOSIS — I700 Atherosclerosis of aorta: Secondary | ICD-10-CM | POA: Insufficient documentation

## 2017-08-17 DIAGNOSIS — R059 Cough, unspecified: Secondary | ICD-10-CM

## 2017-08-17 DIAGNOSIS — R0602 Shortness of breath: Secondary | ICD-10-CM | POA: Diagnosis not present

## 2017-08-17 NOTE — Telephone Encounter (Signed)
Let Leda Gauze Huguley know  1. No lung findings to explain poor control of asthma andout of proportion dyspnea. This means n bronchiectassi, fibrosis, lung cancer or pneumonia  2.  does have coronary artery calcification and last stress test 2013 that was normal. So, maybe she should see if thee is a problem now to explain the dyspnea  3. Has Rt thyroid nodule - should talk t PCP Crist Infante, MD - might need biopsy  4. Dupulimab - emily can you please ask Dr Katy Fitch to give me a cal about the eye issues. Please send note back to me    Ct Chest High Resolution  Result Date: 08/17/2017 CLINICAL DATA:  Persistent cough and increasing shortness of breath. Asthma. EXAM: CT CHEST WITHOUT CONTRAST TECHNIQUE: Multidetector CT imaging of the chest was performed following the standard protocol without intravenous contrast. High resolution imaging of the lungs, as well as inspiratory and expiratory imaging, was performed. COMPARISON:  07/31/2014, 03/29/2014 and 06/26/2009. FINDINGS: Cardiovascular: Atherosclerotic calcification of the arterial vasculature, including coronary arteries. Heart size normal. No pericardial effusion. Mediastinum/Nodes: Hyperattenuating right thyroid nodule, 1.3 cm, previously low in attenuation. Mediastinal lymph nodes are not enlarged by CT size criteria. Hilar regions are difficult to evaluate without IV contrast. No axillary adenopathy. Esophagus is grossly unremarkable. Lungs/Pleura: Mild biapical pleuroparenchymal scarring. Negative for subpleural reticulation, traction bronchiectasis/bronchiolectasis, ground-glass, architectural distortion or honeycombing. Mild scarring in the inferior aspects of both lower lobes, adjacent to the hemidiaphragms. Multiple scattered peripheral peribronchovascular millimetric nodules, similar and highly likely benign. 6 mm subpleural left upper lobe nodule (series 4, image 31), unchanged from 07/31/2014 and considered benign. No pleural fluid. Airway is  unremarkable. No air trapping. Prone imaging is unremarkable. Upper Abdomen: Visualized portions of the liver, adrenal glands, left kidney, spleen, pancreas, stomach and bowel are grossly unremarkable. Musculoskeletal: No worrisome lytic or sclerotic lesions. Degenerative changes in the spine. IMPRESSION: 1. Negative for interstitial lung disease. No pulmonary parenchymal findings to explain the patient's persistent cough and increasing shortness of breath. 2. Aortic atherosclerosis (ICD10-170.0). Coronary artery calcification. 3. Hyperattenuating right thyroid nodule, previously low-attenuation. Consider further evaluation with thyroid ultrasound. If patient is clinically hyperthyroid, consider nuclear medicine thyroid uptake and scan. Electronically Signed   By: Lorin Picket M.D.   On: 08/17/2017 11:03

## 2017-08-18 ENCOUNTER — Other Ambulatory Visit: Payer: Self-pay | Admitting: *Deleted

## 2017-08-18 MED ORDER — MONTELUKAST SODIUM 10 MG PO TABS: 10.0000 mg | ORAL_TABLET | Freq: Every day | ORAL | 5 refills | Status: DC

## 2017-08-20 NOTE — Telephone Encounter (Signed)
Called and spoke with pt letting her know the result of her HRCT.  Stated to pt she needed to f/u with cards regarding coronary artery calcification seen on scan to see if this could be a reason for increased SOB.  Also stated to pt she needed to follow up with PCP due to thyroid nodule that needs looked at.  Pt expressed understanding and stated to me she would call cards and pcp tomorrow to see if she could get an appt scheduled.  Will send pt's scan to both cards and pcp for pt.  Called Dr. Vickey Sages office at [phone] and left a message for Dr. Katy Fitch to call Dr. Chase Caller to discuss med with each other regarding pt's eye issues.  Routing message back to MR at his request.

## 2017-08-24 ENCOUNTER — Other Ambulatory Visit: Payer: Self-pay | Admitting: Internal Medicine

## 2017-08-24 DIAGNOSIS — R7301 Impaired fasting glucose: Secondary | ICD-10-CM | POA: Diagnosis not present

## 2017-08-24 DIAGNOSIS — Z6826 Body mass index (BMI) 26.0-26.9, adult: Secondary | ICD-10-CM | POA: Diagnosis not present

## 2017-08-24 DIAGNOSIS — R0609 Other forms of dyspnea: Secondary | ICD-10-CM | POA: Diagnosis not present

## 2017-08-24 DIAGNOSIS — E041 Nontoxic single thyroid nodule: Secondary | ICD-10-CM

## 2017-08-24 DIAGNOSIS — J45901 Unspecified asthma with (acute) exacerbation: Secondary | ICD-10-CM | POA: Diagnosis not present

## 2017-08-24 DIAGNOSIS — R0789 Other chest pain: Secondary | ICD-10-CM | POA: Diagnosis not present

## 2017-08-24 DIAGNOSIS — I2510 Atherosclerotic heart disease of native coronary artery without angina pectoris: Secondary | ICD-10-CM | POA: Diagnosis not present

## 2017-08-24 DIAGNOSIS — J45909 Unspecified asthma, uncomplicated: Secondary | ICD-10-CM | POA: Diagnosis not present

## 2017-08-24 DIAGNOSIS — M25552 Pain in left hip: Secondary | ICD-10-CM | POA: Diagnosis not present

## 2017-08-27 ENCOUNTER — Ambulatory Visit (INDEPENDENT_AMBULATORY_CARE_PROVIDER_SITE_OTHER): Payer: Medicare Other

## 2017-08-27 ENCOUNTER — Ambulatory Visit
Admission: RE | Admit: 2017-08-27 | Discharge: 2017-08-27 | Disposition: A | Discharge status: Home / Self Care | Payer: Medicare Other | Source: Ambulatory Visit | Attending: Internal Medicine | Admitting: Internal Medicine

## 2017-08-27 DIAGNOSIS — J4550 Severe persistent asthma, uncomplicated: Secondary | ICD-10-CM | POA: Diagnosis not present

## 2017-08-27 DIAGNOSIS — E041 Nontoxic single thyroid nodule: Secondary | ICD-10-CM

## 2017-08-27 DIAGNOSIS — E042 Nontoxic multinodular goiter: Secondary | ICD-10-CM | POA: Diagnosis not present

## 2017-08-31 NOTE — Telephone Encounter (Signed)
D/w Dr Katy Fitch who called me last week and he said he is fine with her having to take dupulimab or any other biologic for asthma

## 2017-09-02 ENCOUNTER — Telehealth: Payer: Self-pay | Admitting: Internal Medicine

## 2017-09-02 NOTE — Telephone Encounter (Signed)
Yes ok to start dupulimab

## 2017-09-02 NOTE — Telephone Encounter (Signed)
#   vials:4 Ordered date:09/02/2017 Shipping Date:09/08/2017

## 2017-09-02 NOTE — Telephone Encounter (Signed)
MR- just to confirm you would like to start the process for Dupulimab?

## 2017-09-03 ENCOUNTER — Telehealth: Payer: Self-pay | Admitting: Internal Medicine

## 2017-09-03 MED ORDER — PREDNISONE 10 MG PO TABS: ORAL_TABLET | ORAL | 0 refills | Status: DC

## 2017-09-03 MED ORDER — ALBUTEROL SULFATE (2.5 MG/3ML) 0.083% IN NEBU: 2.5000 mg | INHALATION_SOLUTION | Freq: Four times a day (QID) | RESPIRATORY_TRACT | 0 refills | Status: DC | PRN

## 2017-09-03 NOTE — Telephone Encounter (Signed)
Had called and spoken with patient regarding beginning [name] Upon speaking with patient, she reported that her symptoms from the 4.4.19 office visit have not improved She is still experiencing prod cough with clear mucus, chest congestion, PND, fatigue and some increased SOB She did not some minimal improvement on the pred taper given at the last office visit Patient is requesting another pred taper   [facility]  MR please advise, thank you

## 2017-09-03 NOTE — Telephone Encounter (Signed)
Pt calling to request a Rx for neb solution be called in because she is sick. Cb is [phone] Pharm [facility]

## 2017-09-03 NOTE — Telephone Encounter (Signed)
See previous message. Patient is requesting pred taper and neb.  MR please advise. Thank you!

## 2017-09-03 NOTE — Telephone Encounter (Signed)
Spoke with MR who also wants patient to have ordered an albuterol neb along with the pred taper.   Called and spoke with patient about Rx's from MR. [facility] [place]. Nothing further is needed at this time.

## 2017-09-03 NOTE — Telephone Encounter (Signed)
   Take prednisone 40 mg daily x 2 days, then 20mg  daily x 2 days, then 10mg  daily x 2 days, then 5mg  daily x 2 days and stop   Allergies  Allergen Reactions  . Eggs Or Egg-Derived Products     RAW EGGS.. Can eat cooked eggs  . Influenza Vaccines   . Nucala [Mepolizumab]   . Cephalosporins Rash  . Codeine Rash  . Levofloxacin Rash  . Pneumococcal Vaccines Rash

## 2017-09-03 NOTE — Telephone Encounter (Signed)
Forms printed for the [place] start and handed to [employer] spoke with patient, she is aware that we are starting this process and that Alroy Bailiff will be communicating with her moving forward regarding the [place].  Pt voiced her understanding  Routing to [employer]

## 2017-09-09 NOTE — Telephone Encounter (Signed)
Prefilled Syringes: # 150mg  4  #75mg  0 Arrival Date: 09/09/2017 Lot #: [id number]  [id number]      75mg  0 Exp Date: 150mg  04/2018   75mg  0

## 2017-09-09 NOTE — Telephone Encounter (Signed)
Left message for patient stating she needs to come by the office to sign forms for specialty meds.

## 2017-09-10 ENCOUNTER — Ambulatory Visit (INDEPENDENT_AMBULATORY_CARE_PROVIDER_SITE_OTHER): Payer: Medicare Other

## 2017-09-10 DIAGNOSIS — J4550 Severe persistent asthma, uncomplicated: Secondary | ICD-10-CM

## 2017-09-10 NOTE — Telephone Encounter (Signed)
Pt. Signed dupixent my way form while she was here for her xolair inj.. I will finish our portion and have MR sign it. Then fax to dupixent my way.

## 2017-09-11 DIAGNOSIS — J4550 Severe persistent asthma, uncomplicated: Secondary | ICD-10-CM

## 2017-09-11 MED ORDER — OMALIZUMAB 150 MG ~~LOC~~ SOLR
300.0000 mg | Freq: Once | SUBCUTANEOUS | Status: AC
Start: 2017-09-10 — End: 2017-09-11
  Administered 2017-09-11: 300 mg via SUBCUTANEOUS

## 2017-09-14 NOTE — Telephone Encounter (Signed)
We have not received a summary of benefits on her yet. I will call 09/15/17 if I haven't heard from [place] my way by 09/15/17 pm..

## 2017-09-15 MED ORDER — OMALIZUMAB 150 MG ~~LOC~~ SOLR
300.0000 mg | SUBCUTANEOUS | Status: DC
  Administered 2017-08-27: 300 mg via SUBCUTANEOUS

## 2017-09-15 NOTE — Progress Notes (Signed)
Documentation of medication administration and charges of Xolair have been completed by Lindsay Lemons, CMA based on the Xolair documentation sheet completed by Tammy Scott.  

## 2017-09-15 NOTE — Telephone Encounter (Signed)
I called dupixent my way they didn't have pt in their system. So I have to fax the enrollment form again 09/16/17.

## 2017-09-16 NOTE — Telephone Encounter (Signed)
I signed the form for dupixent 09/16/2017. With [employer]. Will send to Raquel Sarna so she can be in loop

## 2017-09-24 ENCOUNTER — Ambulatory Visit (INDEPENDENT_AMBULATORY_CARE_PROVIDER_SITE_OTHER): Payer: Medicare Other

## 2017-09-24 DIAGNOSIS — J4550 Severe persistent asthma, uncomplicated: Secondary | ICD-10-CM

## 2017-09-24 MED ORDER — OMALIZUMAB 150 MG ~~LOC~~ SOLR
300.0000 mg | Freq: Once | SUBCUTANEOUS | Status: AC
  Administered 2017-09-24: 300 mg via SUBCUTANEOUS

## 2017-09-30 NOTE — Telephone Encounter (Signed)
Approval rec'd for Dupixent- approved from 06-27-17 through 09-25-2018; gave [place] the Rx verbally and they will contact patient with cost of medication and arrange for shipment to our office for new start.

## 2017-10-01 ENCOUNTER — Inpatient Hospital Stay: Admit: 2017-10-01 | Payer: Medicare Other | Admitting: Orthopedic Surgery

## 2017-10-01 ENCOUNTER — Telehealth: Payer: Self-pay | Admitting: Internal Medicine

## 2017-10-01 SURGERY — CONVERSION, PREVIOUS HIP SURGERY, TO TOTAL HIP ARTHROPLASTY
Anesthesia: Choice | Site: Hip | Laterality: Left

## 2017-10-01 NOTE — Telephone Encounter (Addendum)
Key: [id number] - PA Case ID: [id number] Need help? Call us at [phone]  Outcome Approved on May 17  Your request has been approved. DrugDupixent 300MG /2ML syringes  [employer] Medicare Electronic [place] Form  Nothing further needed at this time

## 2017-10-02 NOTE — Telephone Encounter (Signed)
[facility] called pt today. They said they would try to get her dupixent here by 10/07/17. Will call pt when it comes in.

## 2017-10-06 ENCOUNTER — Telehealth: Payer: Self-pay | Admitting: Internal Medicine

## 2017-10-06 NOTE — Telephone Encounter (Signed)
[facility]. They are sending dupixent tomorrow. Nothing further needed.

## 2017-10-06 NOTE — Telephone Encounter (Signed)
1 prefilled syringe Ordered Date: 10/06/17 Shipping Date: 10/06/17

## 2017-10-07 NOTE — Telephone Encounter (Signed)
1 prefilled syringe Arrival Date: 10/07/17 Lot #:[id number] Exp date: 12/2018

## 2017-10-08 ENCOUNTER — Other Ambulatory Visit: Payer: Self-pay | Admitting: Internal Medicine

## 2017-10-08 ENCOUNTER — Ambulatory Visit (INDEPENDENT_AMBULATORY_CARE_PROVIDER_SITE_OTHER): Payer: Medicare Other

## 2017-10-08 DIAGNOSIS — J4550 Severe persistent asthma, uncomplicated: Secondary | ICD-10-CM | POA: Diagnosis not present

## 2017-10-09 MED ORDER — DUPILUMAB 300 MG/2ML ~~LOC~~ SOSY
300.0000 mg | PREFILLED_SYRINGE | Freq: Once | SUBCUTANEOUS | Status: AC
  Administered 2017-10-08: 600 mg via SUBCUTANEOUS

## 2017-10-14 DIAGNOSIS — H04123 Dry eye syndrome of bilateral lacrimal glands: Secondary | ICD-10-CM | POA: Diagnosis not present

## 2017-10-14 DIAGNOSIS — H40013 Open angle with borderline findings, low risk, bilateral: Secondary | ICD-10-CM | POA: Diagnosis not present

## 2017-10-14 DIAGNOSIS — H02831 Dermatochalasis of right upper eyelid: Secondary | ICD-10-CM | POA: Diagnosis not present

## 2017-10-14 DIAGNOSIS — H1851 Endothelial corneal dystrophy: Secondary | ICD-10-CM | POA: Diagnosis not present

## 2017-10-14 DIAGNOSIS — Z961 Presence of intraocular lens: Secondary | ICD-10-CM | POA: Diagnosis not present

## 2017-10-14 DIAGNOSIS — E119 Type 2 diabetes mellitus without complications: Secondary | ICD-10-CM | POA: Diagnosis not present

## 2017-10-14 DIAGNOSIS — H02834 Dermatochalasis of left upper eyelid: Secondary | ICD-10-CM | POA: Diagnosis not present

## 2017-10-14 DIAGNOSIS — H353131 Nonexudative age-related macular degeneration, bilateral, early dry stage: Secondary | ICD-10-CM | POA: Diagnosis not present

## 2017-10-21 ENCOUNTER — Telehealth: Payer: Self-pay | Admitting: Internal Medicine

## 2017-10-21 NOTE — Telephone Encounter (Signed)
1 prefilled syringe Arrival Date: 10/21/17 Lot #:[id number] Exp date: 02/2019

## 2017-10-23 ENCOUNTER — Ambulatory Visit (INDEPENDENT_AMBULATORY_CARE_PROVIDER_SITE_OTHER): Payer: Medicare Other

## 2017-10-23 DIAGNOSIS — J4550 Severe persistent asthma, uncomplicated: Secondary | ICD-10-CM | POA: Diagnosis not present

## 2017-10-26 ENCOUNTER — Telehealth: Payer: Self-pay | Admitting: Cardiovascular Disease

## 2017-10-26 ENCOUNTER — Telehealth: Payer: Self-pay | Admitting: *Deleted

## 2017-10-26 ENCOUNTER — Telehealth: Payer: Self-pay | Admitting: Internal Medicine

## 2017-10-26 DIAGNOSIS — M25532 Pain in left wrist: Secondary | ICD-10-CM | POA: Diagnosis not present

## 2017-10-26 DIAGNOSIS — K219 Gastro-esophageal reflux disease without esophagitis: Secondary | ICD-10-CM | POA: Diagnosis not present

## 2017-10-26 DIAGNOSIS — M1990 Unspecified osteoarthritis, unspecified site: Secondary | ICD-10-CM | POA: Diagnosis not present

## 2017-10-26 DIAGNOSIS — J4550 Severe persistent asthma, uncomplicated: Secondary | ICD-10-CM | POA: Diagnosis not present

## 2017-10-26 MED ORDER — DUPILUMAB 300 MG/2ML ~~LOC~~ SOSY
300.0000 mg | PREFILLED_SYRINGE | Freq: Once | SUBCUTANEOUS | Status: AC
  Administered 2017-10-23: 300 mg via SUBCUTANEOUS

## 2017-10-26 NOTE — Telephone Encounter (Signed)
Dr. Chase Caller could you please address her Xarelto for surgery.  I believe Pulmonary prescribed. Thank you

## 2017-10-26 NOTE — Telephone Encounter (Signed)
   [facility] Pre-operative Risk Assessment    Request for surgical clearance:  1. What type of surgery is being performed? CONVERSION TO LTHA   2. When is this surgery scheduled? 12-03-17   3. What type of clearance is required (medical clearance vs. Pharmacy clearance to hold med vs. Both)? BOTH  4. Are there any medications that need to be held prior to surgery and how long? [name]   5. Practice name and name of physician performing surgery? DR. Alvan Dame   6. What is your office phone number [phone]   7.   What is your office fax number ATTN: SHERRY WILLIS [phone]  8.   Anesthesia type (None, local, MAC, general) ? SPINAL   [name] M 10/26/2017, 3:21 PM  _________________________________________________________________   (provider comments below)

## 2017-10-26 NOTE — Telephone Encounter (Addendum)
Hi [name]: ok to hold xarelto for 3d prior to spine surgery but I Am not sure pulmonary prescribed this med. Dr Ashok Cordia in 2016 did write in problem list that patient on this med.  -  I reviewed char 2011 and 2015 had CTA - both did not show PE. No CTA after that.   - in April 2013. May 2015, Aug 2018   - duplex negative for DVT - no study after that (July 2018 do not know result). IT appears all this done by PCP Crist Infante, MD   So recommend you check there or double check with patient. I only saw her 1-2 times for asthma and I took over care recently only  THanks  Dr. Brand Males, M.D., [facility].C.P Pulmonary and Critical Care Medicine Staff Physician, [place] Director - Interstitial Lung Disease  Program  Pulmonary [place] at [place], [place], [address]  Pager: [phone], If no answer or between  15:00h - 7:00h: call [phone] Telephone: [phone]

## 2017-10-26 NOTE — Telephone Encounter (Signed)
Walk In pt Form-Surgical Clearance dropped off placed in Triage Box.

## 2017-10-26 NOTE — Telephone Encounter (Signed)
Mickel Baas, please see the message from Dr. Chase Caller regarding pt.  Please advise if there is anything else you need anything else from [place].   Thanks!

## 2017-10-26 NOTE — Telephone Encounter (Signed)
Please address xarelto thanks

## 2017-10-26 NOTE — Telephone Encounter (Signed)
I talked to pt and her PCP does want her seen by Cardiology prior to surgery which is 12/03/17 please arrange with APP or Dr. Acie Fredrickson    Primary Cardiologist:No primary care provider on file.  Chart reviewed as part of pre-operative protocol coverage. Because of [name] past medical history and time since last visit, he/she will require a follow-up visit in order to better assess preoperative cardiovascular risk.  Pre-op covering staff: - Please schedule appointment and call patient to inform them. - Please contact requesting surgeon's office via preferred method (i.e, phone, fax) to inform them of need for appointment prior to surgery.  [name] Kicks, NP  10/26/2017, 4:56 PM

## 2017-10-26 NOTE — Telephone Encounter (Signed)
Pt takes Xarelto for recurrent bilateral DVT. Ideally would recommend holding Xarelto for 3 days prior to procedure since spinal anesthesia will be used. Since we are not prescribing Xarelto and it being used for a non-cardiac indication, will need clearance from prescribing provider. Looks as though Xarelto is being prescribed by Dr Tera Partridge with pulmonology.

## 2017-10-27 NOTE — Telephone Encounter (Signed)
   Primary Cardiologist:Philip Nahser, MD  Chart reviewed as part of pre-operative protocol coverage. Because of [name] past medical history and time since last visit, he/she will require a follow-up visit in order to better assess preoperative cardiovascular risk.  Pre-op covering staff: - Please schedule appointment and call patient to inform them. - Please contact requesting surgeon's office via preferred method (i.e, phone, fax) to inform them of need for appointment prior to surgery.  I will also route to pharmacy for review of the anticoagulant.   Daune Perch, NP  10/27/2017, 3:06 PM

## 2017-10-28 NOTE — Telephone Encounter (Signed)
Pt needs appt

## 2017-10-28 NOTE — Telephone Encounter (Signed)
Pt's surgery is 12/03/17 so she needs appt before 12/02/17  With Selinda Eon, please arrange.

## 2017-10-28 NOTE — Telephone Encounter (Signed)
Spoke with pt. I have cancelled her appointment on 12/03/17 and rescheduled her to see Selinda Eon on 11/17/17

## 2017-11-06 IMAGING — DX DG CHEST 2V
2 series · 2 of 2 positions shown · non-contrast
Comparison: Portable chest x-ray December 19, 2016

CLINICAL DATA: Cough, wheezing, chest congestion, and shortness of
breath for the past week. History of asthma, coronary artery
disease, nonsmoker.

EXAM:
CHEST  2 VIEW

[chest pa]
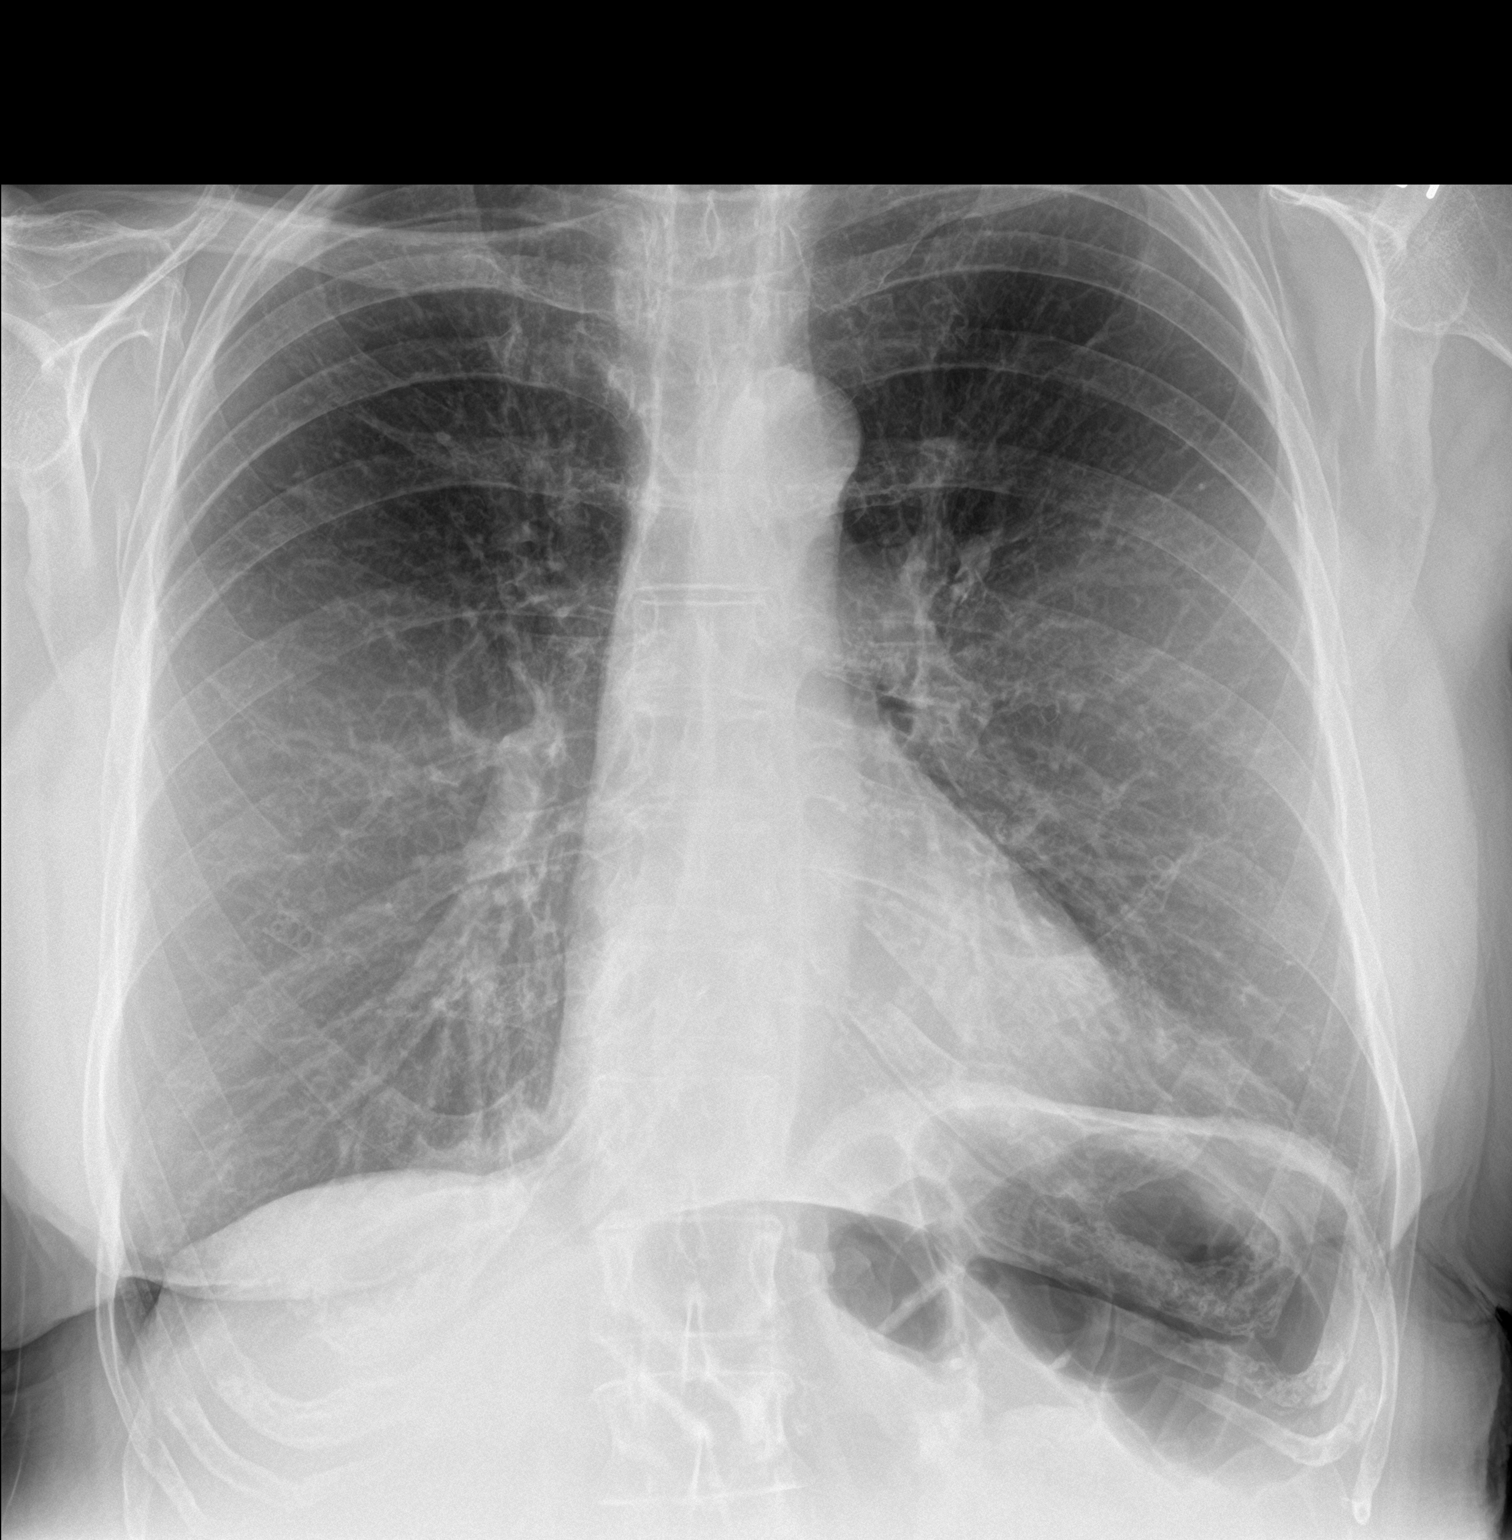

[chest lat]
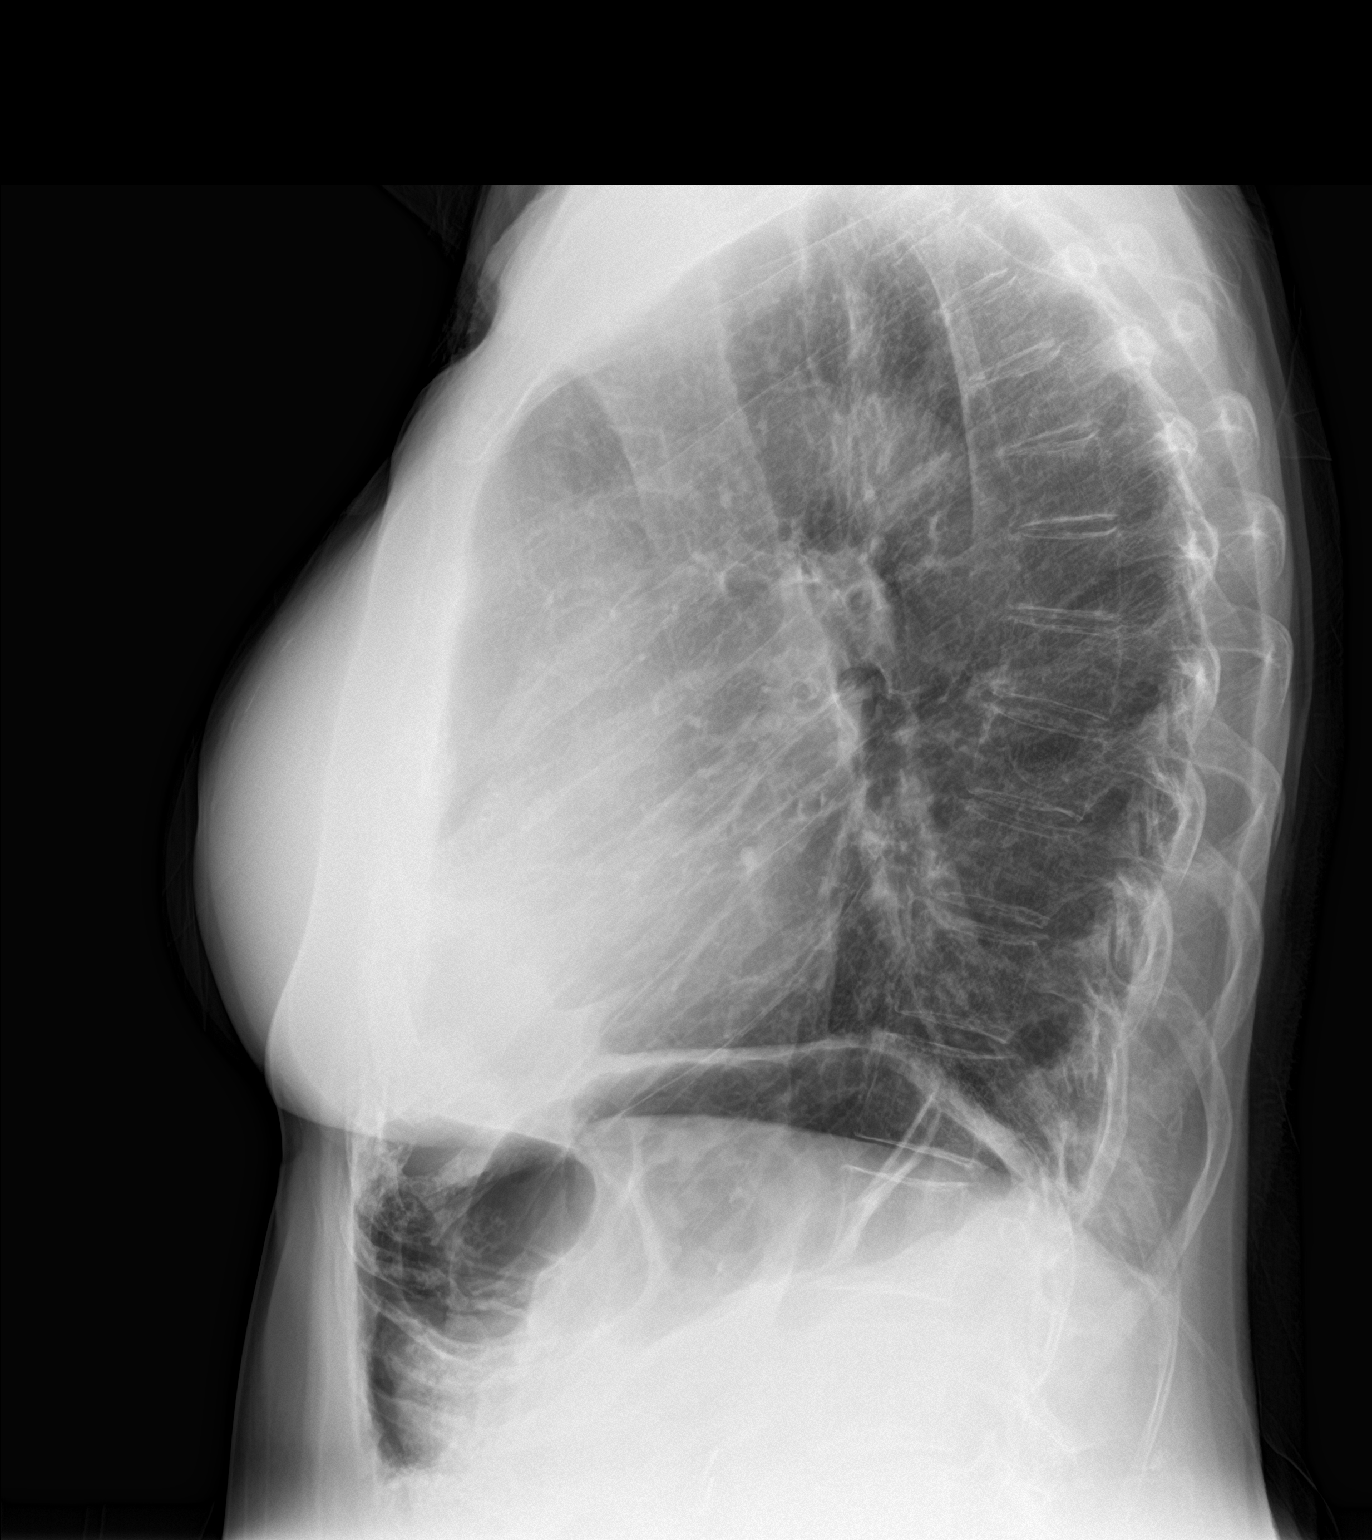

[2 of 2 positions shown; findings below may reference images not displayed]

FINDINGS: The lungs are well-expanded. There is mild hemidiaphragm flattening.
There is no alveolar infiltrate, pleural effusion, or pneumothorax.
The heart and pulmonary vascularity are normal. There is
calcification in the wall of the aortic arch. The observed bony
thorax is unremarkable.
IMPRESSION: Mild hyperinflation consistent with known reactive airway disease.
No pneumonia nor other acute cardiopulmonary abnormality.

Thoracic aortic atherosclerosis.

## 2017-11-09 ENCOUNTER — Ambulatory Visit (INDEPENDENT_AMBULATORY_CARE_PROVIDER_SITE_OTHER): Payer: Medicare Other

## 2017-11-09 DIAGNOSIS — J4550 Severe persistent asthma, uncomplicated: Secondary | ICD-10-CM

## 2017-11-10 MED ORDER — DUPILUMAB 300 MG/2ML ~~LOC~~ SOSY
300.0000 mg | PREFILLED_SYRINGE | Freq: Once | SUBCUTANEOUS | Status: AC
  Administered 2017-11-09: 300 mg via SUBCUTANEOUS

## 2017-11-16 ENCOUNTER — Telehealth: Payer: Self-pay | Admitting: *Deleted

## 2017-11-16 NOTE — Telephone Encounter (Signed)
1 prefilled syringe Ordered Date: 11/16/17 Shipping Date: 11/16/17

## 2017-11-17 ENCOUNTER — Encounter: Payer: Self-pay | Admitting: Physician Assistant

## 2017-11-17 ENCOUNTER — Ambulatory Visit (INDEPENDENT_AMBULATORY_CARE_PROVIDER_SITE_OTHER): Payer: Medicare Other | Admitting: Physician Assistant

## 2017-11-17 VITALS — BP 140/74 | HR 80 | Ht 63.0 in | Wt 155.8 lb

## 2017-11-17 DIAGNOSIS — Z86718 Personal history of other venous thrombosis and embolism: Secondary | ICD-10-CM

## 2017-11-17 DIAGNOSIS — I10 Essential (primary) hypertension: Secondary | ICD-10-CM | POA: Diagnosis not present

## 2017-11-17 DIAGNOSIS — I2510 Atherosclerotic heart disease of native coronary artery without angina pectoris: Secondary | ICD-10-CM

## 2017-11-17 DIAGNOSIS — I5032 Chronic diastolic (congestive) heart failure: Secondary | ICD-10-CM

## 2017-11-17 DIAGNOSIS — Z01818 Encounter for other preprocedural examination: Secondary | ICD-10-CM

## 2017-11-17 DIAGNOSIS — E785 Hyperlipidemia, unspecified: Secondary | ICD-10-CM | POA: Diagnosis not present

## 2017-11-17 NOTE — Patient Instructions (Addendum)
Medication Instructions:   Your physician recommends that you continue on your current medications as directed. Please refer to the Current Medication list given to you today.   If you need a refill on your cardiac medications before your next appointment, please call your pharmacy.  Labwork: NONE ORDERED  TODAY    Testing/Procedures: NONE ORDERED  TODAY    Follow-Up:  WITH DR Acie Fredrickson IN NOVEMBER    Any Other Special Instructions Will Be Listed Below (If Applicable).

## 2017-11-17 NOTE — Telephone Encounter (Signed)
1 prefilled syringe Arrival Date: 11/17/17 Lot #:[id number] Exp date: 03/2019

## 2017-11-17 NOTE — Progress Notes (Signed)
Cardiology Office Note    Date:  11/17/2017   ID:  [name], DOB 03-01-1933, MRN [id number]  PCP:  Crist Infante, MD  Cardiologist: Mertie Moores, MD  Chief Complaint  Patient presents with  . Pre-op Exam    History of Present Illness:  [name] is a 84 y.o. female with history of chest pain with normal coronary arteries and cardiac catheterization 2008, hypertension, HLD, chronic diastolic CHF, asthma.  Nuclear stress test with normal LV function no ischemia 2016 echo 0160 normal systolic function grade 1 DD with mild TR.  Patient last saw Dr. Acie Fredrickson 03/2017 and was doing well without chest pain.  She takes Xarelto for recurrent bilateral DVT.  Ideally pharmacy recommended holding Xarelto for 3 days prior to procedure since final anesthesia will be used but we are not the prescribing provider.  Patient scheduled for left hip replacement July 25 by Dr. Alvan Dame.  The Patient tolerated the first part of her hip surgery back in August after a fall.  Patient has occasional leg swelling and wears compression stockings. Has terrible asthma and chronic shortness of breath and occasional chest pain.  This is all unchanged and has actually improved since her asthma medications have been adjusted.  Coronary calcification noted on CT 03/2017.  She is not very active because of her asthma.  Her husband just had a stroke in his being seen in the [facility] fib clinic later this week.    Past Medical History:  Diagnosis Date  . Asthma, severe persistent    pulmologist-- dr Joya Gaskins  . Chronic kidney disease, stage II (mild)   . DDD (degenerative disc disease), cervical   . Diverticulosis   . Edema, lower extremity   . GERD (gastroesophageal reflux disease)   . History of breast cancer    1989  S/P   RIGHT MASTECTOMY ;  NO CHEMORADIATION //   NO RECURRENCE  . History of DVT of lower extremity   . History of shingles   . Hyperlipidemia   . Leg ulcer, left ([place])   . Osteoporosis, unspecified    Knee and hip osteoarthritis bilaterally  . Perennial allergic rhinitis   . Restless legs syndrome (RLS)   . Unspecified essential hypertension     Past Surgical History:  Procedure Laterality Date  . CARDIAC CATHETERIZATION  08/ 01/ 2008   dr Cathie Olden   normal -- EF of 65%  . CARDIOVASCULAR STRESS TEST  05-22-2011   dr Cathie Olden   normal lexiscan perfusion study/  no ischemia/  lvsf  86%  . CATARACT EXTRACTION W/ INTRAOCULAR LENS  IMPLANT, BILATERAL    . CHOLECYSTECTOMY  1993  . HIP ARTHROPLASTY Left 12/22/2016   Procedure: HEMIARTHROPLASTY, LEFT;  Surgeon: Paralee Cancel, MD;  Location: [facility];  Service: Orthopedics;  Laterality: Left;  . INCISION AND DRAINAGE OF WOUND Left 10/24/2013   Procedure: IRRIGATION AND DEBRIDEMENT OF LEFT LEG WITH PLACEMENT OF ACELL AND WOUND VAC;  Surgeon: Theodoro Kos, DO;  Location: [facility];  Service: Plastics;  Laterality: Left;  . [place]  . MASTECTOMY Bilateral rigth 1989///   left  1990   breast cancer 1989//   fibrocytic disease 1990  . TOTAL ABDOMINAL HYSTERECTOMY W/ BILATERAL SALPINGOOPHORECTOMY  1989   done at same time as right mastectomy  . TOTAL KNEE ARTHROPLASTY Right 10/05/2012   Procedure: RIGHT TOTAL KNEE ARTHROPLASTY;  Surgeon: Mauri Pole, MD;  Location: [facility];  Service: Orthopedics;  Laterality: Right;    Current Medications: Current Meds  Medication Sig  . acetaminophen (TYLENOL) 500 MG tablet Take 500 mg by mouth every 6 (six) hours as needed for mild pain or moderate pain.  [name] albuterol (PROAIR HFA) 108 (90 Base) MCG/ACT inhaler Inhale 2 puffs into the lungs every 6 (six) hours as needed for wheezing or shortness of breath.  [name] albuterol (PROVENTIL) (2.5 MG/3ML) 0.083% nebulizer solution Take 3 mLs (2.5 mg total) by nebulization every 6 (six) hours as needed for wheezing or shortness of breath. Dx: J45.41  . benzonatate (TESSALON) 100 MG capsule Take 1 capsule (100 mg total) by mouth 3 (three)  times daily as needed for cough.  . Calcium Carbonate-Vitamin D (CALTRATE 600+D) 600-400 MG-UNIT per tablet Take 1 tablet by mouth daily.   . chlorpheniramine (CHLOR-TRIMETON) 4 MG tablet Take by mouth. 4 mg in the morning and 8 mg at bedtime  . Cholecalciferol (VITAMIN D3) 1000 UNITS tablet Take 1,000 Units by mouth daily.    [name] dextromethorphan (DELSYM) 30 MG/5ML liquid Take 60 mg by mouth 2 (two) times daily as needed for cough.  . dextromethorphan-guaiFENesin (MUCINEX DM) 30-600 MG 12hr tablet Take 1 tablet by mouth daily as needed for cough.  . Dupilumab (DUPIXENT) 300 MG/2ML SOSY Inject 300 mg into the skin every 14 (fourteen) days.  [name] esomeprazole (NEXIUM) 40 MG capsule Take 1 capsule (40 mg total) by mouth 2 (two) times daily. Name brand only  . fexofenadine (ALLEGRA) 180 MG tablet Take 180 mg by mouth daily.    . furosemide (LASIX) 20 MG tablet Take 1 tablet (20 mg total) daily as needed by mouth for fluid or edema.  [name] KLOR-CON 10 10 MEQ tablet TAKE 1 TABLET BY MOUTH EVERY DAY  . mometasone (NASONEX) 50 MCG/ACT nasal spray Place 2 sprays into the nose daily.  . montelukast (SINGULAIR) 10 MG tablet Take 1 tablet (10 mg total) by mouth at bedtime.  . Multiple Vitamin (MULTIVITAMIN) tablet Take 1 tablet by mouth daily.  . pramipexole (MIRAPEX) 0.125 MG tablet Take 0.375 mg by mouth at bedtime.   . rosuvastatin (CRESTOR) 5 MG tablet TAKE 1 TABLET BY MOUTH EVERYDAY AT BEDTIME  . Spacer/Aero-Holding Chambers (AEROCHAMBER MV) inhaler by Other route. Use as instructed   . SYMBICORT 160-4.5 MCG/ACT inhaler INHALE 2 PUFFS TWICE A DAY  . telmisartan (MICARDIS) 20 MG tablet TAKE 1 TABLET BY MOUTH AT BEDTIME  . Tiotropium Bromide Monohydrate (SPIRIVA RESPIMAT) 2.5 MCG/ACT AERS Inhale 2 puffs into the lungs daily.  . vitamin B-12 (CYANOCOBALAMIN) 500 MCG tablet Take 500 mcg by mouth daily.    [name] Reasons 20 MG TABS tablet Take 20 mg by mouth daily.    Current Facility-Administered Medications for the  11/17/17 encounter (Office Visit) with Imogene Burn, PA-C  Medication  . omalizumab Arvid Right) injection 300 mg  . omalizumab Arvid Right) injection 300 mg  . omalizumab Arvid Right) injection 300 mg     Allergies:   Gabapentin; Eggs or egg-derived products; Influenza vaccines; Nucala [mepolizumab]; Cephalosporins; Codeine; Levofloxacin; and Pneumococcal vaccines   Social History   Socioeconomic History  . Marital status: Married    Spouse name: Not on file  . Number of children: Not on file  . Years of education: Not on file  . Highest education level: Not on file  Occupational History  . Not on file  Social Needs  . Financial resource strain: Not on file  . Food insecurity:    Worry: Not on file  Inability: Not on file  . Transportation needs:    Medical: Not on file    Non-medical: Not on file  Tobacco Use  . Smoking status: Never Smoker  . Smokeless tobacco: Never Used  Substance and Sexual Activity  . Alcohol use: No  . Drug use: No  . Sexual activity: Not on file  Lifestyle  . Physical activity:    Days per week: Not on file    Minutes per session: Not on file  . Stress: Not on file  Relationships  . Social connections:    Talks on phone: Not on file    Gets together: Not on file    Attends religious service: Not on file    Active member of club or organization: Not on file    Attends meetings of clubs or organizations: Not on file    Relationship status: Not on file  Other Topics Concern  . Not on file  Social History Narrative   Originally from [place]. Always lived in [place]. Has traveled across the country to [place]. 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 No known asbestos exposure (brother with mesothelioma). No pets currently. No bird exposure. No mold exposure. Carpet has been removed from her home.      Family History:  The patient's family history includes Asthma in her brother and mother; Cancer in her brother; Coronary  artery disease in her sister; Deep vein thrombosis in her father; Heart attack in her father and mother; Heart disease in her father, mother, and sister; Heart failure in her father; Hyperlipidemia in her mother; Peripheral vascular disease in her father.   ROS:   Please see the history of present illness.    Review of Systems  Constitution: Negative.  HENT: Negative.   Eyes: Negative.   Cardiovascular: Positive for dyspnea on exertion and leg swelling.  Respiratory: Positive for cough.   Hematologic/Lymphatic: Negative.   Musculoskeletal: Positive for joint pain and muscle weakness.  Gastrointestinal: Negative.   Genitourinary: Negative.   Neurological: Negative.    All other systems reviewed and are negative.   PHYSICAL EXAM:   VS:  BP 140/74 (BP Location: Left Arm, Patient Position: Sitting, Cuff Size: Normal)   Pulse 80   Ht 5\' 3"  (1.6 m)   Wt 155 lb 12.8 oz (70.7 kg)   BMI 27.60 kg/m   Physical Exam  GEN: Well nourished, well developed, in no acute distress  Neck: no JVD, carotid bruits, or masses Cardiac:RRR; 2/6 systolic murmur at the left sternal border Respiratory: Decreased breath sounds but clear to auscultation bilaterally, normal work of breathing GI: soft, nontender, nondistended, + BS Ext: Compression stockings in place, without  or edema, Good distal pulses bilaterally Neuro:  Alert and Oriented x 3, Psych: euthymic mood, full affect  Wt Readings from Last 3 Encounters:  11/17/17 155 lb 12.8 oz (70.7 kg)  08/13/17 151 lb 12.8 oz (68.9 kg)  06/15/17 153 lb (69.4 kg)      Studies/Labs Reviewed:   EKG:  EKG is ordered today.  The ekg ordered today demonstrates normal sinus rhythm with left bundle branch block similar to prior EKG  Recent Labs: 12/25/2016: Magnesium 2.0 12/26/2016: Hemoglobin 9.8; Platelets 341 03/19/2017: ALT 12; BUN 24; Creatinine, Ser 1.39; Potassium 4.8; Sodium 138   Lipid Panel    Component Value Date/Time   CHOL 186 03/19/2017 1009    TRIG 57 03/19/2017 1009   HDL 114 03/19/2017 1009   CHOLHDL 1.6  03/19/2017 1009   CHOLHDL 2 02/23/2014 0907   VLDL 14.8 02/23/2014 0907   LDLCALC 61 03/19/2017 1009    Additional studies/ records that were reviewed today include:  2D echo 8/2018Study Conclusions   - Left ventricle: The cavity size was normal. Wall thickness was   increased increased in a pattern of mild to moderate LVH.   Systolic function was normal. The estimated ejection fraction was   in the range of 55% to 60%. Wall motion was normal; there were no   regional wall motion abnormalities. Doppler parameters are   consistent with abnormal left ventricular relaxation (grade 1   diastolic dysfunction). - Ventricular septum: Septal motion showed mild dyssynergy. These   changes are consistent with a left bundle branch block. - Mitral valve: Calcified annulus  Nuclear stress test 11/2016Study Highlights    Nuclear stress EF: 86%.  There was no ST segment deviation noted during stress.  The study is normal.  This is a low risk study.  The left ventricular ejection fraction is hyperdynamic (>65%).   No ischemia or scar.   Small LV cavity with hyperdynamic LV systolic function.        ASSESSMENT:    1. Preoperative clearance   2. Coronary artery disease involving native coronary artery of native heart without angina pectoris   3. Chronic diastolic CHF (congestive heart failure) ([place])   4. Hyperlipidemia, unspecified hyperlipidemia type   5. History of DVT of lower extremity   6. Essential hypertension      PLAN:  In order of problems listed above:  Preoperative clearance for left hip surgery which she tolerated well 12/2016.  Patient has chronic dyspnea on exertion and chest pain secondary to severe persistent asthma.  A history of normal coronary arteries on heart catheterization 12/2006, no ischemia on nuclear stress test 03/2015, coronary calcification on CT 08/2017.  Patient's symptoms have not  changed.  According to the revised cardiac risk index no further work-up is necessary prior to surgery.  She takes Xarelto for recurrent DVTs which is prescribed by Dr. Joylene Draft. According to the Revised Cardiac Risk Index (RCRI), her Perioperative Risk of Major Cardiac Event is (%): 0.9  Her Functional Capacity in METs is: 5.07 according to the Duke Activity Status Index (DASI).  CAD nonobstructive on cardiac cath 2008 no ischemia stress test in 2016.  Coronary calcifications noted on CT 08/2017.  Chronic dyspnea on exertion and chest pain from severe asthma.  No further work-up at this time.  Chronic diastolic CHF compensated  Hyperlipidemia on low-dose Crestor LDL 61-03/2017  History of lower extremity DVTs on chronic Xarelto and compression stockings managed by PCP  Essential hypertension blood pressure stable on current medications    Medication Adjustments/Labs and Tests Ordered: Current medicines are reviewed at length with the patient today.  Concerns regarding medicines are outlined above.  Medication changes, Labs and Tests ordered today are listed in the Patient Instructions below. Patient Instructions  Medication Instructions:   Your physician recommends that you continue on your current medications as directed. Please refer to the Current Medication list given to you today.   If you need a refill on your cardiac medications before your next appointment, please call your pharmacy.  Labwork: NONE ORDERED  TODAY    Testing/Procedures: NONE ORDERED  TODAY    Follow-Up:  WITH DR Acie Fredrickson IN NOVEMBER    Any Other Special Instructions Will Be Listed Below (If Applicable).  Sumner Boast, PA-C  11/17/2017 12:01 PM    [facility] [address], [place], [place]  [address] Phone: [phone]; Fax: [phone]

## 2017-11-22 NOTE — H&P (Signed)
TOTAL HIP REVISION ADMISSION H&P  Patient is admitted for conversion of left hip hemi to THA.  Subjective:  Chief Complaint: Failed left hip hemiarthroplasty  HPI: [name], 84 y.o. female, has a history of pain and functional disability in the left hip due to trauma and patient has failed non-surgical conservative treatments for greater than 12 weeks to include NSAID's and/or analgesics, use of assistive devices and activity modification. The indications for the revision total hip arthroplasty are bearing surface wear leading to  symptomatic synovitis.  Onset of symptoms was gradual starting <1 year ago with gradually worsening course since that time.  Prior procedures on the left hip include hemi-arthroplasty.  Patient currently rates pain in the left hip at 10 out of 10 with activity.  There is night pain, worsening of pain with activity and weight bearing, trendelenberg gait, pain that interfers with activities of daily living and pain with passive range of motion. Patient has evidence of previous left hip hemiarthroplasty by imaging studies.  This condition presents safety issues increasing the risk of falls.  There is no current active infection.  Risks, benefits and expectations were discussed with the patient.  Risks including but not limited to the risk of anesthesia, blood clots, nerve damage, blood vessel damage, failure of the prosthesis, infection and up to and including death.  Patient understand the risks, benefits and expectations and wishes to proceed with surgery.   PCP: Crist Infante, MD  D/C Plans:       Home  Post-op Meds:       No Rx given  Tranexamic Acid:      To be given -   Topically (previous DVTs)  Decadron:      Is to be given  FYI:     Xarelto  Tramadol and APAP  DME:   Pt already has equipment  PT:   No PT   Patient Active Problem List   Diagnosis Date Noted  . Essential hypertension 11/17/2017  . Chronic diastolic CHF (congestive heart failure)  ([place]) 03/19/2017  . CAD (coronary artery disease), native coronary artery 12/20/2016  . Closed left hip fracture ([place]) 12/19/2016  . CKD (chronic kidney disease) stage 3, GFR 30-59 ml/min ([facility]) 12/19/2016  . History of DVT of lower extremity   . Lung nodule 08/03/2014  . Peripheral vascular disease, unspecified ([place]) 09/12/2013  . Hyperlipidemia 03/03/2013  . Expected blood loss anemia 10/06/2012  . Overweight (BMI 25.0-29.9) 10/06/2012  . DJD (degenerative joint disease) 05/26/2012  . Severe persistent asthma 08/05/2007  . Hypertensive heart disease without CHF 04/30/2007  . GERD 01/13/2007   Past Medical History:  Diagnosis Date  . Asthma, severe persistent    pulmologist-- dr Joya Gaskins  . Chronic kidney disease, stage II (mild)   . DDD (degenerative disc disease), cervical   . Diverticulosis   . Edema, lower extremity   . GERD (gastroesophageal reflux disease)   . History of breast cancer    1989  S/P   RIGHT MASTECTOMY ;  NO CHEMORADIATION //   NO RECURRENCE  . History of DVT of lower extremity   . History of shingles   . Hyperlipidemia   . Leg ulcer, left ([place])   . Osteoporosis, unspecified    Knee and hip osteoarthritis bilaterally  . Perennial allergic rhinitis   . Restless legs syndrome (RLS)   . Unspecified essential hypertension     Past Surgical History:  Procedure Laterality Date  . CARDIAC CATHETERIZATION  08/ 01/ 2008  dr Cathie Olden   normal -- EF of 65%  . CARDIOVASCULAR STRESS TEST  05-22-2011   dr Cathie Olden   normal lexiscan perfusion study/  no ischemia/  lvsf  86%  . CATARACT EXTRACTION W/ INTRAOCULAR LENS  IMPLANT, BILATERAL    . CHOLECYSTECTOMY  1993  . HIP ARTHROPLASTY Left 12/22/2016   Procedure: HEMIARTHROPLASTY, LEFT;  Surgeon: Paralee Cancel, MD;  Location: [facility];  Service: Orthopedics;  Laterality: Left;  . INCISION AND DRAINAGE OF WOUND Left 10/24/2013   Procedure: IRRIGATION AND DEBRIDEMENT OF LEFT LEG WITH PLACEMENT OF ACELL AND WOUND VAC;  Surgeon:  Theodoro Kos, DO;  Location: [facility];  Service: Plastics;  Laterality: Left;  . [place]  . MASTECTOMY Bilateral rigth 1989///   left  1990   breast cancer 1989//   fibrocytic disease 1990  . TOTAL ABDOMINAL HYSTERECTOMY W/ BILATERAL SALPINGOOPHORECTOMY  1989   done at same time as right mastectomy  . TOTAL KNEE ARTHROPLASTY Right 10/05/2012   Procedure: RIGHT TOTAL KNEE ARTHROPLASTY;  Surgeon: Mauri Pole, MD;  Location: [facility];  Service: Orthopedics;  Laterality: Right;    Current Facility-Administered Medications  Medication Dose Route Frequency Provider Last Rate Last Dose  . omalizumab Arvid Right) injection 300 mg  300 mg Subcutaneous Q14 Days Brand Males, MD   300 mg at 07/27/17 1013  . omalizumab Arvid Right) injection 300 mg  300 mg Subcutaneous Q14 Days Brand Males, MD   300 mg at 08/13/17 0815  . omalizumab Arvid Right) injection 300 mg  300 mg Subcutaneous Q14 Days Brand Males, MD   300 mg at 08/27/17 6294   Current Outpatient Medications  Medication Sig Dispense Refill Last Dose  . acetaminophen (TYLENOL) 500 MG tablet Take 500 mg by mouth every 6 (six) hours as needed for mild pain or moderate pain.   Taking  . albuterol (PROAIR HFA) 108 (90 Base) MCG/ACT inhaler Inhale 2 puffs into the lungs every 6 (six) hours as needed for wheezing or shortness of breath. 18 g 5 Taking  . albuterol (PROVENTIL) (2.5 MG/3ML) 0.083% nebulizer solution Take 3 mLs (2.5 mg total) by nebulization every 6 (six) hours as needed for wheezing or shortness of breath. Dx: J45.41 360 mL 0 Taking  . benzonatate (TESSALON) 100 MG capsule Take 1 capsule (100 mg total) by mouth 3 (three) times daily as needed for cough. 100 capsule 1 Taking  . Calcium Carbonate-Vitamin D (CALTRATE 600+D) 600-400 MG-UNIT per tablet Take 1 tablet by mouth daily.    Taking  . chlorpheniramine (CHLOR-TRIMETON) 4 MG tablet Take by mouth. 4 mg in the morning and 8 mg at bedtime    Taking  . Cholecalciferol (VITAMIN D3) 1000 UNITS tablet Take 1,000 Units by mouth every evening.    Taking  . dextromethorphan (DELSYM) 30 MG/5ML liquid Take 60 mg by mouth 2 (two) times daily as needed for cough.   Taking  . dextromethorphan-guaiFENesin (MUCINEX DM) 30-600 MG 12hr tablet Take 1 tablet by mouth daily as needed for cough.   Taking  . Dupilumab (DUPIXENT) 300 MG/2ML SOSY Inject 300 mg into the skin every 14 (fourteen) days. 2 Syringe 6 Taking  . esomeprazole (NEXIUM) 40 MG capsule Take 1 capsule (40 mg total) by mouth 2 (two) times daily. Name brand only 180 capsule 3 Taking  . fexofenadine (ALLEGRA) 180 MG tablet Take 180 mg by mouth daily.     Taking  . furosemide (LASIX) 20 MG tablet Take  1 tablet (20 mg total) daily as needed by mouth for fluid or edema. (Patient taking differently: Take 20 mg by mouth daily. ) 90 tablet 3 Taking  . KLOR-CON 10 10 MEQ tablet TAKE 1 TABLET BY MOUTH EVERY DAY (Patient taking differently: TAKE 1 TABLET BY MOUTH EVERY DAY in the morning) 90 tablet 2 Taking  . mometasone (NASONEX) 50 MCG/ACT nasal spray Place 2 sprays into the nose daily. 51 g 11 Taking  . montelukast (SINGULAIR) 10 MG tablet Take 1 tablet (10 mg total) by mouth at bedtime. 30 tablet 5 Taking  . Multiple Vitamin (MULTIVITAMIN) tablet Take 1 tablet by mouth daily.   Taking  . pramipexole (MIRAPEX) 0.125 MG tablet Take 0.375 mg by mouth at bedtime.    Taking  . rosuvastatin (CRESTOR) 5 MG tablet TAKE 1 TABLET BY MOUTH EVERYDAY AT BEDTIME 90 tablet 3 Taking  . Spacer/Aero-Holding Chambers (AEROCHAMBER MV) inhaler by Other route. Use as instructed    Taking  . SYMBICORT 160-4.5 MCG/ACT inhaler INHALE 2 PUFFS TWICE A DAY 30.6 Inhaler 1 Taking  . telmisartan (MICARDIS) 20 MG tablet TAKE 1 TABLET BY MOUTH AT BEDTIME 90 tablet 3 Taking  . Tiotropium Bromide Monohydrate (SPIRIVA RESPIMAT) 2.5 MCG/ACT AERS Inhale 2 puffs into the lungs daily. 3 Inhaler 3 Taking  . vitamin B-12 (CYANOCOBALAMIN)  500 MCG tablet Take 500 mcg by mouth daily.     Taking  . XARELTO 20 MG TABS tablet Take 20 mg by mouth daily with supper.    Taking   Allergies  Allergen Reactions  . Gabapentin Nausea Only  . Eggs Or Egg-Derived Products     RAW EGGS.. Can eat cooked eggs  . Influenza Vaccines     Egg allergy   . Nucala [Mepolizumab]   . Cephalosporins Rash  . Codeine Rash  . Levofloxacin Rash  . Pneumococcal Vaccines Rash    Social History   Tobacco Use  . Smoking status: Never Smoker  . Smokeless tobacco: Never Used  Substance Use Topics  . Alcohol use: No    Family History  Problem Relation Age of Onset  . Heart attack Mother   . Asthma Mother   . Heart disease Mother   . Hyperlipidemia Mother   . Heart attack Father   . Heart failure Father   . Deep vein thrombosis Father   . Heart disease Father   . Peripheral vascular disease Father        amputation  . Cancer Brother   . Asthma Brother   . Coronary artery disease Sister   . Heart disease Sister       Review of Systems  Constitutional: Negative.   HENT: Negative.   Eyes: Negative.   Respiratory: Negative.   Cardiovascular: Negative.   Gastrointestinal: Positive for heartburn.  Genitourinary: Negative.   Musculoskeletal: Positive for joint pain.  Skin: Negative.   Neurological: Negative.   Endo/Heme/Allergies: Positive for environmental allergies.  Psychiatric/Behavioral: Negative.     Objective:  Physical Exam  Constitutional: She is oriented to person, place, and time. She appears well-developed.  HENT:  Head: Normocephalic.  Eyes: Pupils are equal, round, and reactive to light.  Neck: Neck supple. No JVD present. No tracheal deviation present. No thyromegaly present.  Cardiovascular: Normal rate, regular rhythm and intact distal pulses.  Murmur heard. Respiratory: Effort normal and breath sounds normal. No respiratory distress. She has no wheezes.  GI: Soft. There is no tenderness. There is no guarding.   Musculoskeletal:  Left hip: She exhibits decreased range of motion, decreased strength, tenderness and bony tenderness. She exhibits no swelling, no deformity and no laceration (healed previous incision).  Lymphadenopathy:    She has no cervical adenopathy.  Neurological: She is alert and oriented to person, place, and time.  Skin: Skin is warm and dry.  Psychiatric: She has a normal mood and affect.      Labs:   Estimated body mass index is 27.6 kg/m as calculated from the following:   Height as of 11/17/17: 5\' 3"  (1.6 m).   Weight as of 11/17/17: 70.7 kg (155 lb 12.8 oz).  Imaging Review:  Plain radiographs demonstrate previous hemiarthroplasty of the left hip(s). The bone quality appears to be good for age and reported activity level.    Preoperative templating of the joint replacement has been completed, documented, and submitted to the Operating Room personnel in order to optimize intra-operative equipment management.   Assessment/Plan:  Left hip with failed previous hemiarthroplasty.  The patient history, physical examination, clinical judgement of the provider and imaging studies are consistent with failure of the left hip(s), previous total hip hemiarthroplasty. Revision total hip arthroplasty is deemed medically necessary. The treatment options including medical management, injection therapy, arthroscopy and arthroplasty were discussed at length. The risks and benefits of total hip arthroplasty were presented and reviewed. The risks due to aseptic loosening, infection, stiffness, dislocation/subluxation,  thromboembolic complications and other imponderables were discussed.  The patient acknowledged the explanation, agreed to proceed with the plan and consent was signed. Patient is being admitted for inpatient treatment for surgery, pain control, PT, OT, prophylactic antibiotics, VTE prophylaxis, progressive ambulation and ADL's and discharge planning. The patient is  planning to be discharged home.    [place] Ziyad Dyar   PA-C  11/22/2017, 2:41 PM

## 2017-11-24 ENCOUNTER — Ambulatory Visit (INDEPENDENT_AMBULATORY_CARE_PROVIDER_SITE_OTHER): Payer: Medicare Other

## 2017-11-24 DIAGNOSIS — J4550 Severe persistent asthma, uncomplicated: Secondary | ICD-10-CM | POA: Diagnosis not present

## 2017-11-24 NOTE — Patient Instructions (Addendum)
[name]  11/24/2017   Your procedure is scheduled on: 12-03-17  Report to [facility] Main  Entrance  Report to admitting at    [place] AM    Call this number if you have problems the morning of surgery [phone]   Remember: Do not eat food or drink liquids :After Midnight.     Take these medicines the morning of surgery with A SIP OF WATER:inhalers and bring, nasonex allegra, nexium, chlorpheniramine                                You may not have any metal on your body including hair pins and              piercings  Do not wear jewelry, make-up, lotions, powders or perfumes, deodorant             Do not wear nail polish.  Do not shave  48 hours prior to surgery.             Do not bring valuables to the hospital. [place].  Contacts, dentures or bridgework may not be worn into surgery.  Leave suitcase in the car. After surgery it may be brought to your room.                Please read over the following fact sheets you were given: _____________________________________________________________________         [facility] - Preparing for Surgery Before surgery, you can play an important role.  Because skin is not sterile, your skin needs to be as free of germs as possible.  You can reduce the number of germs on your skin by washing with CHG (chlorahexidine gluconate) soap before surgery.  CHG is an antiseptic cleaner which kills germs and bonds with the skin to continue killing germs even after washing. Please DO NOT use if you have an allergy to CHG or antibacterial soaps.  If your skin becomes reddened/irritated stop using the CHG and inform your nurse when you arrive at Short Stay. Do not shave (including legs and underarms) for at least 48 hours prior to the first CHG shower.  You may shave your face/neck. Please follow these instructions carefully:  1.  Shower with CHG Soap the night before surgery  and the  morning of Surgery.  2.  If you choose to wash your hair, wash your hair first as usual with your  normal  shampoo.  3.  After you shampoo, rinse your hair and body thoroughly to remove the  shampoo.                           4.  Use CHG as you would any other liquid soap.  You can apply chg directly  to the skin and wash                       Gently with a scrungie or clean washcloth.  5.  Apply the CHG Soap to your body ONLY FROM THE NECK DOWN.   Do not use on face/ open  Wound or open sores. Avoid contact with eyes, ears mouth and genitals (private parts).                       Wash face,  Genitals (private parts) with your normal soap.             6.  Wash thoroughly, paying special attention to the area where your surgery  will be performed.  7.  Thoroughly rinse your body with warm water from the neck down.  8.  DO NOT shower/wash with your normal soap after using and rinsing off  the CHG Soap.                9.  Pat yourself dry with a clean towel.            10.  Wear clean pajamas.            11.  Place clean sheets on your bed the night of your first shower and do not  sleep with pets. Day of Surgery : Do not apply any lotions/deodorants the morning of surgery.  Please wear clean clothes to the hospital/surgery center.  FAILURE TO FOLLOW THESE INSTRUCTIONS MAY RESULT IN THE CANCELLATION OF YOUR SURGERY PATIENT SIGNATURE_________________________________  NURSE SIGNATURE__________________________________  ________________________________________________________________________  WHAT IS A BLOOD TRANSFUSION? Blood Transfusion Information  A transfusion is the replacement of blood or some of its parts. Blood is made up of multiple cells which provide different functions.  Red blood cells carry oxygen and are used for blood loss replacement.  White blood cells fight against infection.  Platelets control bleeding.  Plasma helps clot  blood.  Other blood products are available for specialized needs, such as hemophilia or other clotting disorders. BEFORE THE TRANSFUSION  Who gives blood for transfusions?   Healthy volunteers who are fully evaluated to make sure their blood is safe. This is blood bank blood. Transfusion therapy is the safest it has ever been in the practice of medicine. Before blood is taken from a donor, a complete history is taken to make sure that person has no history of diseases nor engages in risky social behavior (examples are intravenous drug use or sexual activity with multiple partners). The donor's travel history is screened to minimize risk of transmitting infections, such as malaria. The donated blood is tested for signs of infectious diseases, such as HIV and hepatitis. The blood is then tested to be sure it is compatible with you in order to minimize the chance of a transfusion reaction. If you or a relative donates blood, this is often done in anticipation of surgery and is not appropriate for emergency situations. It takes many days to process the donated blood. RISKS AND COMPLICATIONS Although transfusion therapy is very safe and saves many lives, the main dangers of transfusion include:   Getting an infectious disease.  Developing a transfusion reaction. This is an allergic reaction to something in the blood you were given. Every precaution is taken to prevent this. The decision to have a blood transfusion has been considered carefully by your caregiver before blood is given. Blood is not given unless the benefits outweigh the risks. AFTER THE TRANSFUSION  Right after receiving a blood transfusion, you will usually feel much better and more energetic. This is especially true if your red blood cells have gotten low (anemic). The transfusion raises the level of the red blood cells which carry oxygen, and this usually causes an energy increase.  The  nurse administering the transfusion will monitor  you carefully for complications. HOME CARE INSTRUCTIONS  No special instructions are needed after a transfusion. You may find your energy is better. Speak with your caregiver about any limitations on activity for underlying diseases you may have. SEEK MEDICAL CARE IF:   Your condition is not improving after your transfusion.  You develop redness or irritation at the intravenous (IV) site. SEEK IMMEDIATE MEDICAL CARE IF:  Any of the following symptoms occur over the next 12 hours:  Shaking chills.  You have a temperature by mouth above 102 F (38.9 C), not controlled by medicine.  Chest, back, or muscle pain.  People around you feel you are not acting correctly or are confused.  Shortness of breath or difficulty breathing.  Dizziness and fainting.  You get a rash or develop hives.  You have a decrease in urine output.  Your urine turns a dark color or changes to pink, red, or brown. Any of the following symptoms occur over the next 10 days:  You have a temperature by mouth above 102 F (38.9 C), not controlled by medicine.  Shortness of breath.  Weakness after normal activity.  The white part of the eye turns yellow (jaundice).  You have a decrease in the amount of urine or are urinating less often.  Your urine turns a dark color or changes to pink, red, or brown. Document Released: 04/25/2000 Document Revised: 07/21/2011 Document Reviewed: 12/13/2007 ExitCare Patient Information 2014 [place].  _______________________________________________________________________  Incentive Spirometer  An incentive spirometer is a tool that can help keep your lungs clear and active. This tool measures how well you are filling your lungs with each breath. Taking long deep breaths may help reverse or decrease the chance of developing breathing (pulmonary) problems (especially infection) following:  A long period of time when you are unable to move or be active. BEFORE THE  PROCEDURE   If the spirometer includes an indicator to show your best effort, your nurse or respiratory therapist will set it to a desired goal.  If possible, sit up straight or lean slightly forward. Try not to slouch.  Hold the incentive spirometer in an upright position. INSTRUCTIONS FOR USE  1. Sit on the edge of your bed if possible, or sit up as far as you can in bed or on a chair. 2. Hold the incentive spirometer in an upright position. 3. Breathe out normally. 4. Place the mouthpiece in your mouth and seal your lips tightly around it. 5. Breathe in slowly and as deeply as possible, raising the piston or the ball toward the top of the column. 6. Hold your breath for 3-5 seconds or for as long as possible. Allow the piston or ball to fall to the bottom of the column. 7. Remove the mouthpiece from your mouth and breathe out normally. 8. Rest for a few seconds and repeat Steps 1 through 7 at least 10 times every 1-2 hours when you are awake. Take your time and take a few normal breaths between deep breaths. 9. The spirometer may include an indicator to show your best effort. Use the indicator as a goal to work toward during each repetition. 10. After each set of 10 deep breaths, practice coughing to be sure your lungs are clear. If you have an incision (the cut made at the time of surgery), support your incision when coughing by placing a pillow or rolled up towels firmly against it. Once you are able to get  out of bed, walk around indoors and cough well. You may stop using the incentive spirometer when instructed by your caregiver.  RISKS AND COMPLICATIONS  Take your time so you do not get dizzy or light-headed.  If you are in pain, you may need to take or ask for pain medication before doing incentive spirometry. It is harder to take a deep breath if you are having pain. AFTER USE  Rest and breathe slowly and easily.  It can be helpful to keep track of a log of your progress. Your  caregiver can provide you with a simple table to help with this. If you are using the spirometer at home, follow these instructions: [place] IF:   You are having difficultly using the spirometer.  You have trouble using the spirometer as often as instructed.  Your pain medication is not giving enough relief while using the spirometer.  You develop fever of 100.5 F (38.1 C) or higher. SEEK IMMEDIATE MEDICAL CARE IF:   You cough up bloody sputum that had not been present before.  You develop fever of 102 F (38.9 C) or greater.  You develop worsening pain at or near the incision site. MAKE SURE YOU:   Understand these instructions.  Will watch your condition.  Will get help right away if you are not doing well or get worse. Document Released: 09/08/2006 Document Revised: 07/21/2011 Document Reviewed: 11/09/2006 [employer] Patient Information 2014 [place], [place].   ________________________________________________________________________

## 2017-11-24 NOTE — Progress Notes (Addendum)
[facility] cardiology 11-17-17 epic and chart  ekg 11-17-17 epic  echo 12-20-17 epic pft's 07-02-17 epic

## 2017-11-25 ENCOUNTER — Other Ambulatory Visit (HOSPITAL_COMMUNITY): Payer: Self-pay | Admitting: *Deleted

## 2017-11-25 ENCOUNTER — Encounter (HOSPITAL_COMMUNITY): Payer: Self-pay

## 2017-11-25 ENCOUNTER — Encounter (HOSPITAL_COMMUNITY)
Admission: RE | Admit: 2017-11-25 | Discharge: 2017-11-25 | Disposition: A | Discharge status: Home / Self Care | Payer: Medicare Other | Source: Ambulatory Visit | Attending: Orthopedic Surgery | Admitting: Orthopedic Surgery

## 2017-11-25 ENCOUNTER — Other Ambulatory Visit: Payer: Self-pay

## 2017-11-25 DIAGNOSIS — Y838 Other surgical procedures as the cause of abnormal reaction of the patient, or of later complication, without mention of misadventure at the time of the procedure: Secondary | ICD-10-CM | POA: Insufficient documentation

## 2017-11-25 DIAGNOSIS — Z01812 Encounter for preprocedural laboratory examination: Secondary | ICD-10-CM | POA: Diagnosis not present

## 2017-11-25 DIAGNOSIS — T84091A Other mechanical complication of internal left hip prosthesis, initial encounter: Secondary | ICD-10-CM | POA: Insufficient documentation

## 2017-11-25 DIAGNOSIS — Z0183 Encounter for blood typing: Secondary | ICD-10-CM | POA: Diagnosis not present

## 2017-11-25 HISTORY — DX: Nontoxic single thyroid nodule: E04.1

## 2017-11-25 HISTORY — DX: Cardiac murmur, unspecified: R01.1

## 2017-11-25 HISTORY — DX: Rheumatoid arthritis, unspecified: M06.9

## 2017-11-25 LAB — BASIC METABOLIC PANEL WITH GFR
Anion gap: 7 (ref 5–15)
BUN: 22 mg/dL (ref 8–23)
CO2: 31 mmol/L (ref 22–32)
Calcium: 9.2 mg/dL (ref 8.9–10.3)
Chloride: 105 mmol/L (ref 98–111)
Creatinine, Ser: 1.49 mg/dL — ABNORMAL HIGH (ref 0.44–1.00)
GFR calc Af Amer: 37 mL/min — ABNORMAL LOW (ref >60)
GFR calc non Af Amer: 32 mL/min — ABNORMAL LOW (ref >60)
Glucose, Bld: 99 mg/dL (ref 70–99)
Potassium: 4.8 mmol/L (ref 3.5–5.1)
Sodium: 143 mmol/L (ref 135–145)

## 2017-11-25 LAB — CBC
HEMATOCRIT: 37.5 % (ref 36.0–46.0)
Hemoglobin: 11.8 g/dL — ABNORMAL LOW (ref 12.0–15.0)
MCH: 29.3 pg (ref 26.0–34.0)
MCHC: 31.5 g/dL (ref 30.0–36.0)
MCV: 93.1 fL (ref 78.0–100.0)
Platelets: 246 10*3/uL (ref 150–400)
RBC: 4.03 MIL/uL (ref 3.87–5.11)
RDW: 13.3 % (ref 11.5–15.5)
WBC: 5.5 10*3/uL (ref 4.0–10.5)

## 2017-11-25 LAB — SURGICAL PCR SCREEN
MRSA, PCR: NEGATIVE
Staphylococcus aureus: NEGATIVE

## 2017-11-25 MED ORDER — DUPILUMAB 300 MG/2ML ~~LOC~~ SOSY
300.0000 mg | PREFILLED_SYRINGE | Freq: Once | SUBCUTANEOUS | Status: AC
  Administered 2017-11-24: 300 mg via SUBCUTANEOUS

## 2017-11-25 NOTE — Progress Notes (Signed)
Bmp done 11-24-17 routed to Dr. Alvan Dame via epic

## 2017-12-01 ENCOUNTER — Ambulatory Visit (INDEPENDENT_AMBULATORY_CARE_PROVIDER_SITE_OTHER): Payer: Medicare Other | Admitting: Internal Medicine

## 2017-12-01 ENCOUNTER — Encounter: Payer: Self-pay | Admitting: Internal Medicine

## 2017-12-01 VITALS — BP 142/72 | HR 88 | Ht 64.0 in | Wt 156.6 lb

## 2017-12-01 DIAGNOSIS — D721 Eosinophilia, unspecified: Secondary | ICD-10-CM

## 2017-12-01 DIAGNOSIS — J387 Other diseases of larynx: Secondary | ICD-10-CM | POA: Diagnosis not present

## 2017-12-01 DIAGNOSIS — Z01811 Encounter for preprocedural respiratory examination: Secondary | ICD-10-CM | POA: Diagnosis not present

## 2017-12-01 DIAGNOSIS — I2510 Atherosclerotic heart disease of native coronary artery without angina pectoris: Secondary | ICD-10-CM

## 2017-12-01 DIAGNOSIS — J4550 Severe persistent asthma, uncomplicated: Secondary | ICD-10-CM | POA: Diagnosis not present

## 2017-12-01 LAB — NITRIC OXIDE: NITRIC OXIDE: 45

## 2017-12-01 MED ORDER — MOMETASONE FUROATE 50 MCG/ACT NA SUSP: 2.0000 | Freq: Every day | NASAL | 11 refills | Status: DC

## 2017-12-01 MED ORDER — BUDESONIDE-FORMOTEROL FUMARATE 160-4.5 MCG/ACT IN AERO: 2.0000 | INHALATION_SPRAY | Freq: Two times a day (BID) | RESPIRATORY_TRACT | 1 refills | Status: DC

## 2017-12-01 MED ORDER — BENZONATATE 100 MG PO CAPS: 100.0000 mg | ORAL_CAPSULE | Freq: Three times a day (TID) | ORAL | 1 refills | Status: DC | PRN

## 2017-12-01 MED ORDER — TIOTROPIUM BROMIDE MONOHYDRATE 2.5 MCG/ACT IN AERS: 2.0000 | INHALATION_SPRAY | Freq: Every day | RESPIRATORY_TRACT | 3 refills | Status: DC

## 2017-12-01 NOTE — Progress Notes (Signed)
Subjective:     Patient ID: [name], female   DOB: May 27, 1932, 84 y.o.   MRN: [id number]  PCP Crist Infante, MD   HPI   [facility].:  Follow-up for Severe, Persistent Asthma, Chronic Allergic Rhinitis, DVT, & GERD w/ Hiatal Hernia.  HPI Patient has been seen twice in our office since her last appointment with me. Last visit was on 9/26 with nurse practitioner. Patient was treated for an exacerbation of her asthma with bronchitis. She was given a prednisone taper, instructed to use Mucinex twice daily as needed, and a 7 day course of Augmentin. Since her last appointment she fractured her left hip. She is continuing to walk with a cane. She reports no adverse effects from her Xolair. She does feel the Xolair is helping her significantly. Hasn't needed her rescue inhaler significantly in the last few weeks. Only rare nocturnal awakenings with any dyspnea.   Severe, persistent asthma: Patient currently maintained on a regimen of Xolair, Singulair, Spiriva, and Symbicort. She feels her dyspnea has significantly improved. She still has a scratchy throat & hoarse voice. She has her baseline nonproductive cough. No wheezing.  Chronic allergic rhinitis: Previously referred to ENT. Regimen at last appointment with me included Allegra, Singulair, Nasonex, and Xolair. Patient has had multiple different antibiotic courses for recurrent sinusitis. Only having intermittent sinus congestion & drainage.   DVT: Present in bilateral lower extremities. Patient on systemic anticoagulation with Xarelto indefinitely. No melena or hematochezia. No hematuria.   GERD with hiatal hernia: Previously prescribed Nexium. No reflux or dyspepsia. No morning brash water taste.   Review of Systems  No chest pain or tightness. No fever or chills. No abdominal pain or nausea.    OV 06/15/2017  Chief Complaint  Patient presents with  . Follow-up    flare ups with asthma when the weather changes.  Right now it is a cough,  with congestions and wheezing. Non-productive dry cough. SOB with any activity.  Hoarseness.   84 year old female.  Transfer of care from Dr. Ashok Cordia who is left the practice.  Moderate persistent asthma with an elevated IgE.  She is here with her husband.  She tells me that in 2015-2017 she was started on Xolair and then subsequently switched in [place] last year or year before last.  This then caused nonspecific side effect such as erythema in her face and nonspecific pain that reminded her of shingles.  Therefore she discontinued this and the side effects resolved.  She did have 6 doses of the interleukin-5 receptor antibody.  She went back on Xolair.  Overall she feels that since starting Xolair her quality of life and asthma exacerbations have improved but nevertheless she still gets asthma exacerbations every few months and has to go on prednisone.  Currently she states that for the last few to several days she has had increased cough although no change in sputum.  She is also wheezing a little bit more than usual.  Asthma control questionnaire shows for the last week she is waking up a few times at night.  When she wakes up she has moderate symptoms.  Her activities are slightly limited because of asthma.  She is moderate amount of shortness of breath because of asthma and she is wheezing a little of the time and using albuterol for rescue at least 1 or 2 times daily.  Exam nitric oxide is significantly elevated at 125 ppb.  She think she will benefit from prednisone  She she reports to me  for the first time a new issue of left anterior cervical lymph node/submandibular lymph node.  She says it is been present for a while but other physicians were examined it have not felt it.  She asked me to examine it.  She feels it is slowly growing.  Insidious onset for a year.   OV 08/13/2017  Chief Complaint  Patient presents with  . Follow-up    Pt states she has had many flare-ups with her asthma. States she  has had a lot of sinus drainage and congestion and a lot of chest tightness.    84 year old female with complicated asthma   poor control of asthma continues.  At my last visit she did a prednisone taper.  Then a few weeks ago her sinuses acted up and therefore her asthma acted up and she did another prednisone taper on herself at home but it was just for a few days.  She continues on Spiriva, Symbicort, Singulair, Nexium and Xolair injections.  She feels that ever since Dr. Asencion Noble retired her asthma has been out of control.  Currently for the last few weeks her sinuses have been acting up and she wants antibiotics.  In particular she wants Augmentin.  She says in the past this was deemed as an allergy but she went and saw some doctor in [place] and it looks like she has been desensitized.  Penicillin is no longer listed as an allergy.  She insists that she wants Augmentin.  Review of the chart shows she has had chronic sinusitis and a CT scan of the sinus 1 year ago in February 2018.  She says she has seen ENT for this.  No surgery has been recommended.  Just nasal hygiene and antibiotic courses.  Currently her symptoms are quite significant with asthma control question of greater than 2 it appears this might all just be her baseline.  Her exam nitric oxide is still high over 100.  At night she is waking up several times.  When she wakes up she has moderate symptoms.  Activities are slightly limited because of asthma.  She has moderate amount of shortness of breath because of asthma.  She is wheezing hardly and she uses albuterol for rescue at least 1 or 2 times daily.   OV  Chief Complaint  Patient presents with  . Follow-up    Pt states her breathing has been much better since she began new injection but states she is coughing more than before. Denies any CP.    Follow-up severe persistent asthma with eosinophilia: In April 2019 we started dupulimab and stop the Xolair. With this  asthma control is significantly better. Her asthma control questionnaire average score has drop to 0.6. Her exhaled nitric oxide is drop from 100s to 43. She says she is much less short of breath and is able to do a lot more things. She is very pleased with the new injectable. However she feels that her baseline chronic cough is slightly worse. It is moderate in intensity is dry. It does not wake up at night. It is not associated with wheezing or shortness of breath. It is associated with Spiriva MDI. She does clear her throat. Coughing is made worse by talking. Off note, she has new issue of left hip surgery coming up in 2 days. She feels from a pulmonary standpoint she will be stable to do the surgery because of prior experience with surgery.  Asthma Control Panel  10/11/15 - IgE -  425 , rest of blood allergy panel negative. Blood eos 300cells (al;so 300 on 12/19/16). CT Sinus feb 2018 - chronic pan sinusitis     07/02/2016 06/15/2017  08/13/2017  12/01/2017   Current Med Regimen  Spriva, symbicort,  Singulair, xolair, nexium same Spiriva, Symbicort, Singulair, Nexium and dupulimab  ACQ 5 point- 1 week. wtd avg score. <1.0 is good control 0.75-1.25 is grey zone. >1.25 poor control. Delta 0.5 is clinically meaningful   2.4 0.6  FeNO ppB  126 ppb 104 ppb 43  FeV1  1.777/90%, ratio 74,      Planned intervention  for visit   HRCT, talk to eye doc and if ok start dupixent, augmenting, prednsione,            has a past medical history of Asthma, severe persistent, Chronic kidney disease, stage II (mild), DDD (degenerative disc disease), cervical, Diverticulosis (yrs ago), Edema, lower extremity, GERD (gastroesophageal reflux disease), Heart murmur, History of breast cancer (1990 left mastectomy also), History of DVT of lower extremity (5 yrs ago), History of shingles, Hyperlipidemia, Leg ulcer, left ([place]), Osteoporosis, unspecified, Perennial allergic rhinitis, Restless legs syndrome (RLS), Rheumatoid  arthritis ([place]), Thyroid nodule, and Unspecified essential hypertension.   reports that she has never smoked. She has never used smokeless tobacco.  Past Surgical History:  Procedure Laterality Date  . CARDIAC CATHETERIZATION  08/ 01/ 2008   dr Cathie Olden   normal -- EF of 65%  . CARDIOVASCULAR STRESS TEST  05-22-2011   dr Cathie Olden   normal lexiscan perfusion study/  no ischemia/  lvsf  86%  . CATARACT EXTRACTION W/ INTRAOCULAR LENS  IMPLANT, BILATERAL    . CHOLECYSTECTOMY  1993   laparoscopic  . HIP ARTHROPLASTY Left 12/22/2016   Procedure: HEMIARTHROPLASTY, LEFT;  Surgeon: Paralee Cancel, MD;  Location: [facility];  Service: Orthopedics;  Laterality: Left;  . INCISION AND DRAINAGE OF WOUND Left 10/24/2013   Procedure: IRRIGATION AND DEBRIDEMENT OF LEFT LEG WITH PLACEMENT OF ACELL AND WOUND VAC;  Surgeon: Theodoro Kos, DO;  Location: [facility];  Service: Plastics;  Laterality: Left;  . [name]  . MASTECTOMY Bilateral rigth 1989///   left  1990   breast cancer 1989//   fibrocytic disease 1990  . TOTAL ABDOMINAL HYSTERECTOMY W/ BILATERAL SALPINGOOPHORECTOMY  1989   done at same time as right mastectomy  . TOTAL KNEE ARTHROPLASTY Right 10/05/2012   Procedure: RIGHT TOTAL KNEE ARTHROPLASTY;  Surgeon: Mauri Pole, MD;  Location: [facility];  Service: Orthopedics;  Laterality: Right;    Allergies  Allergen Reactions  . Gabapentin Nausea Only  . Eggs Or Egg-Derived Products     RAW EGGS.. Can eat cooked eggs  . Influenza Vaccines     Egg allergy   . Nucala [Mepolizumab]   . Cephalosporins Rash  . Codeine Rash  . Levofloxacin Rash  . Pneumococcal Vaccines Rash    Immunization History  Administered Date(s) Administered  . Pneumococcal Conjugate-13 09/14/2012  . Pneumococcal Polysaccharide-23 01/31/2009  . Zoster Recombinat (Shingrix) 04/18/2017, 09/16/2017    Family History  Problem Relation Age of Onset  . Heart attack Mother   . Asthma Mother    . Heart disease Mother   . Hyperlipidemia Mother   . Heart attack Father   . Heart failure Father   . Deep vein thrombosis Father   . Heart disease Father   . Peripheral vascular disease Father  amputation  . Cancer Brother   . Asthma Brother   . Coronary artery disease Sister   . Heart disease Sister      Current Outpatient Medications:  .  acetaminophen (TYLENOL) 500 MG tablet, Take 500 mg by mouth every 6 (six) hours as needed for mild pain or moderate pain., Disp: , Rfl:  .  albuterol (PROAIR HFA) 108 (90 Base) MCG/ACT inhaler, Inhale 2 puffs into the lungs every 6 (six) hours as needed for wheezing or shortness of breath., Disp: 18 g, Rfl: 5 .  benzonatate (TESSALON) 100 MG capsule, Take 1 capsule (100 mg total) by mouth 3 (three) times daily as needed for cough., Disp: 100 capsule, Rfl: 1 .  Calcium Carbonate-Vitamin D (CALTRATE 600+D) 600-400 MG-UNIT per tablet, Take 1 tablet by mouth daily. , Disp: , Rfl:  .  chlorpheniramine (CHLOR-TRIMETON) 4 MG tablet, Take by mouth. 4 mg in the morning and 8 mg at bedtime, Disp: , Rfl:  .  Cholecalciferol (VITAMIN D3) 1000 UNITS tablet, Take 1,000 Units by mouth every evening. , Disp: , Rfl:  .  dextromethorphan-guaiFENesin (MUCINEX DM) 30-600 MG 12hr tablet, Take 1 tablet by mouth daily as needed for cough., Disp: , Rfl:  .  Dupilumab (DUPIXENT) 300 MG/2ML SOSY, Inject 300 mg into the skin every 14 (fourteen) days., Disp: 2 Syringe, Rfl: 6 .  esomeprazole (NEXIUM) 40 MG capsule, Take 1 capsule (40 mg total) by mouth 2 (two) times daily. Name brand only, Disp: 180 capsule, Rfl: 3 .  fexofenadine (ALLEGRA) 180 MG tablet, Take 180 mg by mouth daily.  , Disp: , Rfl:  .  furosemide (LASIX) 20 MG tablet, Take 1 tablet (20 mg total) daily as needed by mouth for fluid or edema. (Patient taking differently: Take 20 mg by mouth daily. ), Disp: 90 tablet, Rfl: 3 .  KLOR-CON 10 10 MEQ tablet, TAKE 1 TABLET BY MOUTH EVERY DAY (Patient taking  differently: TAKE 1 TABLET BY MOUTH EVERY DAY in the morning), Disp: 90 tablet, Rfl: 2 .  mometasone (NASONEX) 50 MCG/ACT nasal spray, Place 2 sprays into the nose daily., Disp: 51 g, Rfl: 11 .  montelukast (SINGULAIR) 10 MG tablet, Take 1 tablet (10 mg total) by mouth at bedtime., Disp: 30 tablet, Rfl: 5 .  Multiple Vitamin (MULTIVITAMIN) tablet, Take 1 tablet by mouth daily., Disp: , Rfl:  .  pramipexole (MIRAPEX) 0.125 MG tablet, Take 0.375 mg by mouth at bedtime. , Disp: , Rfl:  .  rosuvastatin (CRESTOR) 5 MG tablet, TAKE 1 TABLET BY MOUTH EVERYDAY AT BEDTIME, Disp: 90 tablet, Rfl: 3 .  Spacer/Aero-Holding Chambers (AEROCHAMBER MV) inhaler, by Other route. Use as instructed , Disp: , Rfl:  .  SYMBICORT 160-4.5 MCG/ACT inhaler, INHALE 2 PUFFS TWICE A DAY, Disp: 30.6 Inhaler, Rfl: 1 .  telmisartan (MICARDIS) 20 MG tablet, TAKE 1 TABLET BY MOUTH AT BEDTIME, Disp: 90 tablet, Rfl: 3 .  Tiotropium Bromide Monohydrate (SPIRIVA RESPIMAT) 2.5 MCG/ACT AERS, Inhale 2 puffs into the lungs daily., Disp: 3 Inhaler, Rfl: 3 .  vitamin B-12 (CYANOCOBALAMIN) 500 MCG tablet, Take 500 mcg by mouth daily.  , Disp: , Rfl:  .  albuterol (PROVENTIL) (2.5 MG/3ML) 0.083% nebulizer solution, Take 3 mLs (2.5 mg total) by nebulization every 6 (six) hours as needed for wheezing or shortness of breath. Dx: J45.41 (Patient not taking: Reported on 12/01/2017), Disp: 360 mL, Rfl: 0 .  dextromethorphan (DELSYM) 30 MG/5ML liquid, Take 60 mg by mouth 2 (two) times daily as  needed for cough., Disp: , Rfl:  .  XARELTO 20 MG TABS tablet, Take 20 mg by mouth daily with supper. , Disp: , Rfl:   Current Facility-Administered Medications:  .  omalizumab (XOLAIR) injection 300 mg, 300 mg, Subcutaneous, Q14 Days, Raahil Ong, MD, 300 mg at 07/27/17 1013 .  omalizumab Arvid Right) injection 300 mg, 300 mg, Subcutaneous, Q14 Days, Bambie Pizzolato, MD, 300 mg at 08/13/17 0815 .  omalizumab Arvid Right) injection 300 mg, 300 mg, Subcutaneous,  Q14 Days, Maybelle Depaoli, MD, 300 mg at 08/27/17 3570    Review of Systems     Objective:   Physical Exam  Constitutional: She is oriented to person, place, and time. She appears well-developed and well-nourished. No distress.  HENT:  Head: Normocephalic and atraumatic.  Right Ear: External ear normal.  Left Ear: External ear normal.  Mouth/Throat: Oropharynx is clear and moist. No oropharyngeal exudate.  Eyes: Pupils are equal, round, and reactive to light. Conjunctivae and EOM are normal. Right eye exhibits no discharge. Left eye exhibits no discharge. No scleral icterus.  Neck: Normal range of motion. Neck supple. No JVD present. No tracheal deviation present. No thyromegaly present.  Cardiovascular: Normal rate, regular rhythm, normal heart sounds and intact distal pulses. Exam reveals no gallop and no friction rub.  No murmur heard. Pulmonary/Chest: Effort normal and breath sounds normal. No respiratory distress. She has no wheezes. She has no rales. She exhibits no tenderness.  Abdominal: Soft. Bowel sounds are normal. She exhibits no distension and no mass. There is no tenderness. There is no rebound and no guarding.  Musculoskeletal: Normal range of motion. She exhibits no edema or tenderness.  Left hip pain while standing and walking  Lymphadenopathy:    She has no cervical adenopathy.  Neurological: She is alert and oriented to person, place, and time. She has normal reflexes. No cranial nerve deficit. She exhibits normal muscle tone. Coordination normal.  Skin: Skin is warm and dry. No rash noted. She is not diaphoretic. No erythema. No pallor.  Psychiatric: She has a normal mood and affect. Her behavior is normal. Judgment and thought content normal.  Vitals reviewed.   Today's Vitals   12/01/17 0913  BP: (!) 142/72  Pulse: 88  SpO2: 99%  Weight: 156 lb 9.6 oz (71 kg)  Height: 5\' 4"  (1.626 m)    Estimated body mass index is 26.88 kg/m as calculated from the  following:   Height as of this encounter: 5\' 4"  (1.626 m).   Weight as of this encounter: 156 lb 9.6 oz (71 kg).      Assessment:       ICD-10-CM   1. Severe persistent asthma without complication V77.93   2. Eosinophilia D72.1   3. Irritable larynx J38.7   4. Preoperative respiratory examination Z01.811        Plan:     Severe persistent asthma without complication Eosinophilia  - glad asthma significantly better with dupixent - continue Spiriva, Symbicort, Singulair, Nexium and dupulimab  - no need for xolair - If you cannot make it next week for your routine dupulimab njection; that is fine but if the asthma is getting worse please call us for prednisone  Irritable larynx - residual cough and worsening cough after starting dupulimab injection is due to cough neuropathy - Gabapentin is a standard of care treatment for this but in the future if you still finds as an issue we could consider Lyrica - At this point we will just watch the  situation especially given upcoming hip surgery - will consider dc spiriva at followup as fist step if asthma doing ell  Preoperative respiratory examination -okay for left hip surgery with low moderate risk  Follow-up - 3 months or sooner if needed; before ACQ and exhaled nitric oxide at follow-up   Dr. Brand Males, M.D., [facility].C.P Pulmonary and Critical Care Medicine Staff Physician, [place] Director - Interstitial Lung Disease  Program  Pulmonary [place] at [place], [place], [address]  Pager: [phone], If no answer or between  15:00h - 7:00h: call [phone] Telephone: [phone]

## 2017-12-01 NOTE — Patient Instructions (Addendum)
Severe persistent asthma without complication Eosinophilia  - glad asthma significantly better with dupixent - continue Spiriva, Symbicort, Singulair, Nexium and dupulimab  - no need for xolair - If you cannot make it next week for your routine dupulimab njection; that is fine but if the asthma is getting worse please call us for prednisone  Irritable larynx - residual cough and worsening cough after starting dupulimab injection is due to cough neuropathy - Gabapentin is a standard of care treatment for this but in the future if you still finds as an issue we could consider Lyrica - At this point we will just watch the situation especially given upcoming hip surgery - will consider dc spiriva at followup as fist step if asthma doing ell  Preoperative respiratory examination -okay for left hip surgery with low moderate risk  Follow-up - 3 months or sooner if needed; before ACQ and exhaled nitric oxide at follow-up

## 2017-12-02 ENCOUNTER — Telehealth: Payer: Self-pay | Admitting: Internal Medicine

## 2017-12-02 ENCOUNTER — Ambulatory Visit: Payer: Medicare Other | Admitting: Physician Assistant

## 2017-12-02 ENCOUNTER — Encounter (HOSPITAL_COMMUNITY): Payer: Self-pay | Admitting: Anesthesiology

## 2017-12-02 MED ORDER — TIOTROPIUM BROMIDE MONOHYDRATE 2.5 MCG/ACT IN AERS: 2.0000 | INHALATION_SPRAY | Freq: Every day | RESPIRATORY_TRACT | 3 refills | Status: DC

## 2017-12-02 MED ORDER — GENTAMICIN SULFATE 40 MG/ML IJ SOLN
5.0000 mg/kg | INTRAVENOUS | Status: DC
  Administered 2017-12-03: 310 mg via INTRAVENOUS
  Filled 2017-12-02: qty 7.75

## 2017-12-02 MED ORDER — BUDESONIDE-FORMOTEROL FUMARATE 160-4.5 MCG/ACT IN AERO: 2.0000 | INHALATION_SPRAY | Freq: Two times a day (BID) | RESPIRATORY_TRACT | 1 refills | Status: DC

## 2017-12-02 MED ORDER — BENZONATATE 100 MG PO CAPS: 100.0000 mg | ORAL_CAPSULE | Freq: Three times a day (TID) | ORAL | 1 refills | Status: DC | PRN

## 2017-12-02 MED ORDER — MOMETASONE FUROATE 50 MCG/ACT NA SUSP: 2.0000 | Freq: Every day | NASAL | 11 refills | Status: DC

## 2017-12-02 NOTE — Telephone Encounter (Signed)
Refills resent to pt's pharmacy for pt.  Called pt to let her know that this was done. Pt expressed understanding. Nothing further needed.

## 2017-12-02 NOTE — Anesthesia Preprocedure Evaluation (Addendum)
Anesthesia Evaluation  Patient identified by MRN, date of birth, ID band Patient awake    Reviewed: Allergy & Precautions, NPO status , Patient's Chart, lab work & pertinent test results  Airway Mallampati: II  TM Distance: >3 FB Neck ROM: Full    Dental  (+) Upper Dentures, Partial Lower   Pulmonary asthma ,    Pulmonary exam normal breath sounds clear to auscultation       Cardiovascular hypertension, Pt. on medications + CAD, + Peripheral Vascular Disease and +CHF  + Valvular Problems/Murmurs  Rhythm:Regular Rate:Normal  Hx/o DVT post op hip Fx EKG- NSR LBBB pattern   Neuro/Psych negative neurological ROS  negative psych ROS   GI/Hepatic Neg liver ROS, GERD  Medicated and Controlled,  Endo/Other  Hyperlipidemia  Renal/GU Renal InsufficiencyRenal disease  negative genitourinary   Musculoskeletal  (+) Arthritis , Osteoarthritis,  Failed left hip hemiarthroplasty S/P left hip Fx   Abdominal   Peds  Hematology  (+) anemia , xarelto- last dose   Anesthesia Other Findings   Reproductive/Obstetrics                           Anesthesia Physical Anesthesia Plan  ASA: III  Anesthesia Plan: Spinal   Post-op Pain Management:    Induction:   PONV Risk Score and Plan: 3 and Ondansetron, Treatment may vary due to age or medical condition, Dexamethasone and Propofol infusion  Airway Management Planned: Natural Airway and Simple Face Mask  Additional Equipment:   Intra-op Plan:   Post-operative Plan:   Informed Consent: I have reviewed the patients History and Physical, chart, labs and discussed the procedure including the risks, benefits and alternatives for the proposed anesthesia with the patient or authorized representative who has indicated his/her understanding and acceptance.   Dental advisory given  Plan Discussed with: CRNA and Surgeon  Anesthesia Plan Comments:         Anesthesia Quick Evaluation

## 2017-12-03 ENCOUNTER — Encounter (HOSPITAL_COMMUNITY): Payer: Self-pay

## 2017-12-03 ENCOUNTER — Encounter (HOSPITAL_COMMUNITY)
Admission: RE | Disposition: A | Discharge status: Home Health | Payer: Self-pay | Source: Ambulatory Visit | Attending: Orthopedic Surgery

## 2017-12-03 ENCOUNTER — Inpatient Hospital Stay (HOSPITAL_COMMUNITY): Payer: Medicare Other | Admitting: Anesthesiology

## 2017-12-03 ENCOUNTER — Inpatient Hospital Stay (HOSPITAL_COMMUNITY): Payer: Medicare Other

## 2017-12-03 ENCOUNTER — Inpatient Hospital Stay (HOSPITAL_COMMUNITY)
Admission: RE | Admit: 2017-12-03 | Discharge: 2017-12-05 | DRG: 467 | Disposition: A | Discharge status: Home Health | Payer: Medicare Other | Source: Ambulatory Visit | Attending: Orthopedic Surgery | Admitting: Orthopedic Surgery

## 2017-12-03 ENCOUNTER — Other Ambulatory Visit: Payer: Self-pay

## 2017-12-03 DIAGNOSIS — Z86718 Personal history of other venous thrombosis and embolism: Secondary | ICD-10-CM | POA: Diagnosis not present

## 2017-12-03 DIAGNOSIS — Z96649 Presence of unspecified artificial hip joint: Secondary | ICD-10-CM

## 2017-12-03 DIAGNOSIS — I5032 Chronic diastolic (congestive) heart failure: Secondary | ICD-10-CM | POA: Diagnosis present

## 2017-12-03 DIAGNOSIS — I130 Hypertensive heart and chronic kidney disease with heart failure and stage 1 through stage 4 chronic kidney disease, or unspecified chronic kidney disease: Secondary | ICD-10-CM | POA: Diagnosis not present

## 2017-12-03 DIAGNOSIS — N183 Chronic kidney disease, stage 3 (moderate): Secondary | ICD-10-CM | POA: Diagnosis not present

## 2017-12-03 DIAGNOSIS — M810 Age-related osteoporosis without current pathological fracture: Secondary | ICD-10-CM | POA: Diagnosis not present

## 2017-12-03 DIAGNOSIS — Z96642 Presence of left artificial hip joint: Secondary | ICD-10-CM | POA: Diagnosis not present

## 2017-12-03 DIAGNOSIS — M069 Rheumatoid arthritis, unspecified: Secondary | ICD-10-CM | POA: Diagnosis present

## 2017-12-03 DIAGNOSIS — J4550 Severe persistent asthma, uncomplicated: Secondary | ICD-10-CM | POA: Diagnosis present

## 2017-12-03 DIAGNOSIS — Z853 Personal history of malignant neoplasm of breast: Secondary | ICD-10-CM

## 2017-12-03 DIAGNOSIS — E785 Hyperlipidemia, unspecified: Secondary | ICD-10-CM | POA: Diagnosis present

## 2017-12-03 DIAGNOSIS — Z9049 Acquired absence of other specified parts of digestive tract: Secondary | ICD-10-CM

## 2017-12-03 DIAGNOSIS — I739 Peripheral vascular disease, unspecified: Secondary | ICD-10-CM | POA: Diagnosis present

## 2017-12-03 DIAGNOSIS — T84091A Other mechanical complication of internal left hip prosthesis, initial encounter: Secondary | ICD-10-CM | POA: Diagnosis present

## 2017-12-03 DIAGNOSIS — G2581 Restless legs syndrome: Secondary | ICD-10-CM | POA: Diagnosis not present

## 2017-12-03 DIAGNOSIS — K219 Gastro-esophageal reflux disease without esophagitis: Secondary | ICD-10-CM | POA: Diagnosis present

## 2017-12-03 DIAGNOSIS — Z9011 Acquired absence of right breast and nipple: Secondary | ICD-10-CM | POA: Diagnosis not present

## 2017-12-03 DIAGNOSIS — Z9012 Acquired absence of left breast and nipple: Secondary | ICD-10-CM | POA: Diagnosis not present

## 2017-12-03 DIAGNOSIS — Z9689 Presence of other specified functional implants: Secondary | ICD-10-CM | POA: Diagnosis not present

## 2017-12-03 DIAGNOSIS — I2510 Atherosclerotic heart disease of native coronary artery without angina pectoris: Secondary | ICD-10-CM | POA: Diagnosis present

## 2017-12-03 DIAGNOSIS — Y792 Prosthetic and other implants, materials and accessory orthopedic devices associated with adverse incidents: Secondary | ICD-10-CM | POA: Diagnosis present

## 2017-12-03 DIAGNOSIS — Z471 Aftercare following joint replacement surgery: Secondary | ICD-10-CM | POA: Diagnosis not present

## 2017-12-03 DIAGNOSIS — M1612 Unilateral primary osteoarthritis, left hip: Secondary | ICD-10-CM | POA: Diagnosis not present

## 2017-12-03 HISTORY — PX: CONVERSION TO TOTAL HIP: SHX5784

## 2017-12-03 LAB — TYPE AND SCREEN
ABO/RH(D): O POS
Antibody Screen: NEGATIVE

## 2017-12-03 SURGERY — CONVERSION, PREVIOUS HIP SURGERY, TO TOTAL HIP ARTHROPLASTY
Anesthesia: Spinal | Site: Hip | Laterality: Left

## 2017-12-03 MED ORDER — EPHEDRINE SULFATE-NACL 50-0.9 MG/10ML-% IV SOSY
PREFILLED_SYRINGE | INTRAVENOUS | Status: DC | PRN
  Administered 2017-12-03: 20 mg via INTRAVENOUS

## 2017-12-03 MED ORDER — PHENYLEPHRINE HCL 10 MG/ML IJ SOLN
INTRAMUSCULAR | Status: AC
  Filled 2017-12-03: qty 1

## 2017-12-03 MED ORDER — PHENYLEPHRINE 40 MCG/ML (10ML) SYRINGE FOR IV PUSH (FOR BLOOD PRESSURE SUPPORT)
PREFILLED_SYRINGE | INTRAVENOUS | Status: DC | PRN
  Administered 2017-12-03 (×3): 120 ug via INTRAVENOUS

## 2017-12-03 MED ORDER — ACETAMINOPHEN 500 MG PO TABS: 1000.0000 mg | ORAL_TABLET | Freq: Three times a day (TID) | ORAL | 0 refills | Status: DC

## 2017-12-03 MED ORDER — ACETAMINOPHEN 10 MG/ML IV SOLN
1000.0000 mg | Freq: Once | INTRAVENOUS | Status: AC
  Administered 2017-12-03: 1000 mg via INTRAVENOUS

## 2017-12-03 MED ORDER — BISACODYL 10 MG RE SUPP: 10.0000 mg | Freq: Every day | RECTAL | Status: DC | PRN

## 2017-12-03 MED ORDER — ONDANSETRON HCL 4 MG/2ML IJ SOLN
INTRAMUSCULAR | Status: DC | PRN
  Administered 2017-12-03: 4 mg via INTRAVENOUS

## 2017-12-03 MED ORDER — POLYETHYLENE GLYCOL 3350 17 G PO PACK
17.0000 g | PACK | Freq: Two times a day (BID) | ORAL | Status: DC
  Administered 2017-12-03 – 2017-12-04 (×2): 17 g via ORAL
  Filled 2017-12-03 (×4): qty 1

## 2017-12-03 MED ORDER — PROPOFOL 500 MG/50ML IV EMUL
INTRAVENOUS | Status: DC | PRN
  Administered 2017-12-03: 50 ug/kg/min via INTRAVENOUS

## 2017-12-03 MED ORDER — LIDOCAINE 2% (20 MG/ML) 5 ML SYRINGE
INTRAMUSCULAR | Status: AC
  Filled 2017-12-03: qty 5

## 2017-12-03 MED ORDER — TRANEXAMIC ACID 1000 MG/10ML IV SOLN
1000.0000 mg | INTRAVENOUS | Status: AC
  Administered 2017-12-03: 1000 mg via INTRAVENOUS
  Filled 2017-12-03: qty 10

## 2017-12-03 MED ORDER — ONDANSETRON HCL 4 MG/2ML IJ SOLN
INTRAMUSCULAR | Status: AC
  Filled 2017-12-03: qty 2

## 2017-12-03 MED ORDER — IRBESARTAN 75 MG PO TABS
75.0000 mg | ORAL_TABLET | Freq: Every day | ORAL | Status: DC
Start: 2017-12-03 — End: 2017-12-05
  Administered 2017-12-03 – 2017-12-05 (×3): 75 mg via ORAL
  Filled 2017-12-03 (×3): qty 1

## 2017-12-03 MED ORDER — PHENOL 1.4 % MT LIQD: 1.0000 | OROMUCOSAL | Status: DC | PRN

## 2017-12-03 MED ORDER — ESOMEPRAZOLE MAGNESIUM 40 MG PO CPDR
40.0000 mg | DELAYED_RELEASE_CAPSULE | Freq: Every day | ORAL | Status: DC
  Administered 2017-12-04 – 2017-12-05 (×2): 40 mg via ORAL
  Filled 2017-12-03 (×2): qty 1

## 2017-12-03 MED ORDER — STERILE WATER FOR IRRIGATION IR SOLN
Status: DC | PRN
  Administered 2017-12-03: 2000 mL

## 2017-12-03 MED ORDER — DIPHENHYDRAMINE HCL 12.5 MG/5ML PO ELIX: 12.5000 mg | ORAL_SOLUTION | ORAL | Status: DC | PRN

## 2017-12-03 MED ORDER — ACETAMINOPHEN 10 MG/ML IV SOLN
INTRAVENOUS | Status: AC
  Filled 2017-12-03: qty 100

## 2017-12-03 MED ORDER — DEXAMETHASONE SODIUM PHOSPHATE 10 MG/ML IJ SOLN
INTRAMUSCULAR | Status: AC
  Filled 2017-12-03: qty 1

## 2017-12-03 MED ORDER — ACETAMINOPHEN 500 MG PO TABS
1000.0000 mg | ORAL_TABLET | Freq: Three times a day (TID) | ORAL | Status: AC
  Administered 2017-12-03 – 2017-12-04 (×4): 1000 mg via ORAL
  Filled 2017-12-03 (×4): qty 2

## 2017-12-03 MED ORDER — FENTANYL CITRATE (PF) 100 MCG/2ML IJ SOLN
INTRAMUSCULAR | Status: DC | PRN
  Administered 2017-12-03 (×2): 50 ug via INTRAVENOUS

## 2017-12-03 MED ORDER — MEPERIDINE HCL 50 MG/ML IJ SOLN: 6.2500 mg | INTRAMUSCULAR | Status: DC | PRN

## 2017-12-03 MED ORDER — BUPIVACAINE IN DEXTROSE 0.75-8.25 % IT SOLN
INTRATHECAL | Status: DC | PRN
  Administered 2017-12-03: 1.8 mL via INTRATHECAL

## 2017-12-03 MED ORDER — PROPOFOL 10 MG/ML IV BOLUS
INTRAVENOUS | Status: AC
  Filled 2017-12-03: qty 60

## 2017-12-03 MED ORDER — TRAMADOL HCL 50 MG PO TABS
50.0000 mg | ORAL_TABLET | Freq: Four times a day (QID) | ORAL | Status: DC | PRN
  Administered 2017-12-03 – 2017-12-05 (×4): 50 mg via ORAL
  Filled 2017-12-03 (×4): qty 1

## 2017-12-03 MED ORDER — NON FORMULARY: 40.0000 mg | Freq: Two times a day (BID) | Status: DC

## 2017-12-03 MED ORDER — LACTATED RINGERS IV SOLN
INTRAVENOUS | Status: DC
  Administered 2017-12-03: 1000 mL via INTRAVENOUS
  Administered 2017-12-03: 09:00:00 via INTRAVENOUS

## 2017-12-03 MED ORDER — DOCUSATE SODIUM 100 MG PO CAPS: 100.0000 mg | ORAL_CAPSULE | Freq: Two times a day (BID) | ORAL | 0 refills | Status: DC

## 2017-12-03 MED ORDER — PROPOFOL 10 MG/ML IV BOLUS
INTRAVENOUS | Status: DC | PRN
  Administered 2017-12-03: 20 mg via INTRAVENOUS

## 2017-12-03 MED ORDER — ROSUVASTATIN CALCIUM 5 MG PO TABS
5.0000 mg | ORAL_TABLET | Freq: Every day | ORAL | Status: DC
  Administered 2017-12-03 – 2017-12-04 (×2): 5 mg via ORAL
  Filled 2017-12-03 (×2): qty 1

## 2017-12-03 MED ORDER — VANCOMYCIN HCL IN DEXTROSE 1-5 GM/200ML-% IV SOLN
1000.0000 mg | Freq: Two times a day (BID) | INTRAVENOUS | Status: AC
  Administered 2017-12-03: 1000 mg via INTRAVENOUS
  Filled 2017-12-03: qty 200

## 2017-12-03 MED ORDER — FUROSEMIDE 20 MG PO TABS
20.0000 mg | ORAL_TABLET | Freq: Every day | ORAL | Status: DC
  Administered 2017-12-04 – 2017-12-05 (×2): 20 mg via ORAL
  Filled 2017-12-03 (×2): qty 1

## 2017-12-03 MED ORDER — PRAMIPEXOLE DIHYDROCHLORIDE 0.25 MG PO TABS
0.3750 mg | ORAL_TABLET | Freq: Every day | ORAL | Status: DC
  Administered 2017-12-03 – 2017-12-05 (×2): 0.375 mg via ORAL
  Filled 2017-12-03 (×2): qty 1.5

## 2017-12-03 MED ORDER — MAGNESIUM CITRATE PO SOLN: 1.0000 | Freq: Once | ORAL | Status: DC | PRN

## 2017-12-03 MED ORDER — MONTELUKAST SODIUM 10 MG PO TABS
10.0000 mg | ORAL_TABLET | Freq: Every day | ORAL | Status: DC
  Administered 2017-12-03 – 2017-12-04 (×2): 10 mg via ORAL
  Filled 2017-12-03 (×2): qty 1

## 2017-12-03 MED ORDER — CHLORHEXIDINE GLUCONATE 4 % EX LIQD: 60.0000 mL | Freq: Once | CUTANEOUS | Status: DC

## 2017-12-03 MED ORDER — BENZONATATE 100 MG PO CAPS: 100.0000 mg | ORAL_CAPSULE | Freq: Three times a day (TID) | ORAL | Status: DC | PRN

## 2017-12-03 MED ORDER — ALUM & MAG HYDROXIDE-SIMETH 200-200-20 MG/5ML PO SUSP: 15.0000 mL | ORAL | Status: DC | PRN

## 2017-12-03 MED ORDER — LIDOCAINE 2% (20 MG/ML) 5 ML SYRINGE
INTRAMUSCULAR | Status: DC | PRN
  Administered 2017-12-03: 40 mg via INTRAVENOUS

## 2017-12-03 MED ORDER — METOCLOPRAMIDE HCL 5 MG PO TABS: 5.0000 mg | ORAL_TABLET | Freq: Three times a day (TID) | ORAL | Status: DC | PRN

## 2017-12-03 MED ORDER — MORPHINE SULFATE (PF) 2 MG/ML IV SOLN: 0.5000 mg | INTRAVENOUS | Status: DC | PRN

## 2017-12-03 MED ORDER — DOCUSATE SODIUM 100 MG PO CAPS
100.0000 mg | ORAL_CAPSULE | Freq: Two times a day (BID) | ORAL | Status: DC
  Administered 2017-12-03 – 2017-12-05 (×4): 100 mg via ORAL
  Filled 2017-12-03 (×4): qty 1

## 2017-12-03 MED ORDER — CELECOXIB 200 MG PO CAPS
200.0000 mg | ORAL_CAPSULE | Freq: Two times a day (BID) | ORAL | Status: DC
  Administered 2017-12-03 – 2017-12-05 (×4): 200 mg via ORAL
  Filled 2017-12-03 (×4): qty 1

## 2017-12-03 MED ORDER — METHOCARBAMOL 500 MG PO TABS
500.0000 mg | ORAL_TABLET | Freq: Four times a day (QID) | ORAL | Status: DC | PRN
  Administered 2017-12-03 (×2): 500 mg via ORAL
  Filled 2017-12-03 (×2): qty 1

## 2017-12-03 MED ORDER — FENTANYL CITRATE (PF) 100 MCG/2ML IJ SOLN
INTRAMUSCULAR | Status: AC
  Filled 2017-12-03: qty 2

## 2017-12-03 MED ORDER — ASPIRIN 81 MG PO CHEW
81.0000 mg | CHEWABLE_TABLET | Freq: Two times a day (BID) | ORAL | Status: DC
  Administered 2017-12-03 – 2017-12-05 (×4): 81 mg via ORAL
  Filled 2017-12-03 (×4): qty 1

## 2017-12-03 MED ORDER — METOCLOPRAMIDE HCL 5 MG/ML IJ SOLN: 10.0000 mg | Freq: Once | INTRAMUSCULAR | Status: DC | PRN

## 2017-12-03 MED ORDER — METHOCARBAMOL 500 MG PO TABS: 500.0000 mg | ORAL_TABLET | Freq: Four times a day (QID) | ORAL | 0 refills | Status: DC | PRN

## 2017-12-03 MED ORDER — FLUTICASONE PROPIONATE 50 MCG/ACT NA SUSP
2.0000 | Freq: Every day | NASAL | Status: DC
Start: 2017-12-04 — End: 2017-12-05
  Administered 2017-12-04 – 2017-12-05 (×2): 2 via NASAL
  Filled 2017-12-03: qty 16

## 2017-12-03 MED ORDER — POLYETHYLENE GLYCOL 3350 17 G PO PACK: 17.0000 g | PACK | Freq: Two times a day (BID) | ORAL | 0 refills | Status: DC

## 2017-12-03 MED ORDER — ONDANSETRON HCL 4 MG/2ML IJ SOLN
4.0000 mg | Freq: Four times a day (QID) | INTRAMUSCULAR | Status: DC | PRN
Start: 2017-12-03 — End: 2017-12-05

## 2017-12-03 MED ORDER — METHOCARBAMOL 1000 MG/10ML IJ SOLN
500.0000 mg | Freq: Four times a day (QID) | INTRAVENOUS | Status: DC | PRN
  Administered 2017-12-03: 500 mg via INTRAVENOUS
  Filled 2017-12-03: qty 550

## 2017-12-03 MED ORDER — SODIUM CHLORIDE 0.9 % IV SOLN
INTRAVENOUS | Status: DC | PRN
  Administered 2017-12-03: 50 ug/min via INTRAVENOUS

## 2017-12-03 MED ORDER — TRAMADOL HCL 50 MG PO TABS
100.0000 mg | ORAL_TABLET | Freq: Four times a day (QID) | ORAL | Status: DC | PRN
  Administered 2017-12-03 – 2017-12-04 (×3): 100 mg via ORAL
  Filled 2017-12-03 (×3): qty 2

## 2017-12-03 MED ORDER — ALBUTEROL SULFATE (2.5 MG/3ML) 0.083% IN NEBU: 2.5000 mg | INHALATION_SOLUTION | Freq: Four times a day (QID) | RESPIRATORY_TRACT | Status: DC | PRN

## 2017-12-03 MED ORDER — VANCOMYCIN HCL IN DEXTROSE 1-5 GM/200ML-% IV SOLN
1000.0000 mg | INTRAVENOUS | Status: AC
  Administered 2017-12-03: 1000 mg via INTRAVENOUS
  Filled 2017-12-03: qty 200

## 2017-12-03 MED ORDER — DEXAMETHASONE SODIUM PHOSPHATE 10 MG/ML IJ SOLN
10.0000 mg | Freq: Once | INTRAMUSCULAR | Status: AC
  Administered 2017-12-04: 10 mg via INTRAVENOUS
  Filled 2017-12-03: qty 1

## 2017-12-03 MED ORDER — ALBUTEROL SULFATE HFA 108 (90 BASE) MCG/ACT IN AERS: 2.0000 | INHALATION_SPRAY | Freq: Four times a day (QID) | RESPIRATORY_TRACT | Status: DC | PRN

## 2017-12-03 MED ORDER — ONDANSETRON HCL 4 MG PO TABS: 4.0000 mg | ORAL_TABLET | Freq: Four times a day (QID) | ORAL | Status: DC | PRN

## 2017-12-03 MED ORDER — LORATADINE 10 MG PO TABS
10.0000 mg | ORAL_TABLET | Freq: Every day | ORAL | Status: DC
  Administered 2017-12-04 – 2017-12-05 (×2): 10 mg via ORAL
  Filled 2017-12-03 (×2): qty 1

## 2017-12-03 MED ORDER — TIOTROPIUM BROMIDE MONOHYDRATE 18 MCG IN CAPS
1.0000 | ORAL_CAPSULE | Freq: Every day | RESPIRATORY_TRACT | Status: DC
  Administered 2017-12-04 – 2017-12-05 (×2): 18 ug via RESPIRATORY_TRACT
  Filled 2017-12-03: qty 5

## 2017-12-03 MED ORDER — SODIUM CHLORIDE 0.9 % IV SOLN
INTRAVENOUS | Status: DC
  Administered 2017-12-03: 21:00:00 via INTRAVENOUS
  Administered 2017-12-03: 1000 mL via INTRAVENOUS
  Administered 2017-12-04: 06:00:00 via INTRAVENOUS

## 2017-12-03 MED ORDER — POTASSIUM CHLORIDE CRYS ER 10 MEQ PO TBCR
10.0000 meq | EXTENDED_RELEASE_TABLET | Freq: Every day | ORAL | Status: DC
  Administered 2017-12-03 – 2017-12-05 (×3): 10 meq via ORAL
  Filled 2017-12-03 (×3): qty 1

## 2017-12-03 MED ORDER — 0.9 % SODIUM CHLORIDE (POUR BTL) OPTIME
TOPICAL | Status: DC | PRN
  Administered 2017-12-03: 1000 mL

## 2017-12-03 MED ORDER — FERROUS SULFATE 325 (65 FE) MG PO TABS: 325.0000 mg | ORAL_TABLET | Freq: Three times a day (TID) | ORAL | 3 refills | Status: DC

## 2017-12-03 MED ORDER — HYDROMORPHONE HCL 1 MG/ML IJ SOLN: 0.2500 mg | INTRAMUSCULAR | Status: DC | PRN

## 2017-12-03 MED ORDER — TRAMADOL HCL 50 MG PO TABS: 50.0000 mg | ORAL_TABLET | Freq: Four times a day (QID) | ORAL | 0 refills | Status: AC | PRN

## 2017-12-03 MED ORDER — DEXAMETHASONE SODIUM PHOSPHATE 10 MG/ML IJ SOLN
10.0000 mg | Freq: Once | INTRAMUSCULAR | Status: AC
  Administered 2017-12-03: 10 mg via INTRAVENOUS

## 2017-12-03 MED ORDER — MOMETASONE FURO-FORMOTEROL FUM 200-5 MCG/ACT IN AERO
2.0000 | INHALATION_SPRAY | Freq: Two times a day (BID) | RESPIRATORY_TRACT | Status: DC
  Administered 2017-12-03 – 2017-12-05 (×4): 2 via RESPIRATORY_TRACT
  Filled 2017-12-03: qty 8.8

## 2017-12-03 MED ORDER — MENTHOL 3 MG MT LOZG: 1.0000 | LOZENGE | OROMUCOSAL | Status: DC | PRN

## 2017-12-03 MED ORDER — FERROUS SULFATE 325 (65 FE) MG PO TABS
325.0000 mg | ORAL_TABLET | Freq: Three times a day (TID) | ORAL | Status: DC
  Administered 2017-12-04 – 2017-12-05 (×4): 325 mg via ORAL
  Filled 2017-12-03 (×4): qty 1

## 2017-12-03 MED ORDER — METOCLOPRAMIDE HCL 5 MG/ML IJ SOLN: 5.0000 mg | Freq: Three times a day (TID) | INTRAMUSCULAR | Status: DC | PRN

## 2017-12-03 SURGICAL SUPPLY — 62 items
ARTICULEZE HEAD (Hips) ×3 IMPLANT
BAG ZIPLOCK 12X15 (MISCELLANEOUS) ×3 IMPLANT
BLADE SAW SGTL 11.0X1.19X90.0M (BLADE) IMPLANT
BLADE SAW SGTL 18X1.27X75 (BLADE) ×2 IMPLANT
BLADE SAW SGTL 18X1.27X75MM (BLADE) ×1
BRUSH FEMORAL CANAL (MISCELLANEOUS) ×3 IMPLANT
COVER SURGICAL LIGHT HANDLE (MISCELLANEOUS) ×3 IMPLANT
CUP ACETBLR 52 OD PINNACLE (Hips) ×3 IMPLANT
DERMABOND ADVANCED (GAUZE/BANDAGES/DRESSINGS) ×2
DERMABOND ADVANCED .7 DNX12 (GAUZE/BANDAGES/DRESSINGS) ×1 IMPLANT
DRAPE ORTHO SPLIT 77X108 STRL (DRAPES) ×4
DRAPE POUCH INSTRU U-SHP 10X18 (DRAPES) ×3 IMPLANT
DRAPE SURG 17X11 SM STRL (DRAPES) ×3 IMPLANT
DRAPE SURG ORHT 6 SPLT 77X108 (DRAPES) ×2 IMPLANT
DRAPE U-SHAPE 47X51 STRL (DRAPES) ×3 IMPLANT
DRESSING AQUACEL AG SP 3.5X10 (GAUZE/BANDAGES/DRESSINGS) IMPLANT
DRSG AQUACEL AG ADV 3.5X14 (GAUZE/BANDAGES/DRESSINGS) ×3 IMPLANT
DRSG AQUACEL AG SP 3.5X10 (GAUZE/BANDAGES/DRESSINGS)
DRSG EMULSION OIL 3X16 NADH (GAUZE/BANDAGES/DRESSINGS) ×3 IMPLANT
DRSG MEPILEX BORDER 4X4 (GAUZE/BANDAGES/DRESSINGS) ×3 IMPLANT
DRSG MEPILEX BORDER 4X8 (GAUZE/BANDAGES/DRESSINGS) ×3 IMPLANT
DURAPREP 26ML APPLICATOR (WOUND CARE) ×3 IMPLANT
ELECT BLADE TIP CTD 4 INCH (ELECTRODE) ×3 IMPLANT
ELECT REM PT RETURN 15FT ADLT (MISCELLANEOUS) ×3 IMPLANT
ELIMINATOR HOLE APEX DEPUY (Hips) ×3 IMPLANT
FACESHIELD WRAPAROUND (MASK) ×12 IMPLANT
GLOVE BIOGEL M 7.0 STRL (GLOVE) IMPLANT
GLOVE BIOGEL PI IND STRL 7.5 (GLOVE) ×1 IMPLANT
GLOVE BIOGEL PI IND STRL 8.5 (GLOVE) ×1 IMPLANT
GLOVE BIOGEL PI INDICATOR 7.5 (GLOVE) ×2
GLOVE BIOGEL PI INDICATOR 8.5 (GLOVE) ×2
GLOVE ECLIPSE 8.0 STRL XLNG CF (GLOVE) ×6 IMPLANT
GLOVE ORTHO TXT STRL SZ7.5 (GLOVE) ×6 IMPLANT
GLOVE SURG ORTHO 8.0 STRL STRW (GLOVE) ×3 IMPLANT
GLOVE SURG SS PI 6.5 STRL IVOR (GLOVE) ×6 IMPLANT
GOWN STRL REUS W/TWL LRG LVL3 (GOWN DISPOSABLE) ×3 IMPLANT
GOWN STRL REUS W/TWL XL LVL3 (GOWN DISPOSABLE) ×6 IMPLANT
HANDPIECE INTERPULSE COAX TIP (DISPOSABLE) ×2
HEAD ARTICULEZE (Hips) ×1 IMPLANT
LINER NEUTRAL 52X36MM PLUS 4 (Liner) ×3 IMPLANT
MANIFOLD NEPTUNE II (INSTRUMENTS) ×3 IMPLANT
NS IRRIG 1000ML POUR BTL (IV SOLUTION) ×6 IMPLANT
PADDING CAST COTTON 6X4 STRL (CAST SUPPLIES) ×3 IMPLANT
POSITIONER SURGICAL ARM (MISCELLANEOUS) ×3 IMPLANT
PRESSURIZER FEMORAL UNIV (MISCELLANEOUS) ×3 IMPLANT
SCREW 6.5MMX25MM (Screw) ×3 IMPLANT
SET HNDPC FAN SPRY TIP SCT (DISPOSABLE) ×1 IMPLANT
SPONGE LAP 18X18 RF (DISPOSABLE) ×3 IMPLANT
SPONGE LAP 4X18 RFD (DISPOSABLE) ×3 IMPLANT
STAPLER VISISTAT 35W (STAPLE) ×3 IMPLANT
SUCTION FRAZIER HANDLE 10FR (MISCELLANEOUS) ×2
SUCTION TUBE FRAZIER 10FR DISP (MISCELLANEOUS) ×1 IMPLANT
SUT STRATAFIX 1PDS 45CM VIOLET (SUTURE) ×3 IMPLANT
SUT VIC AB 1 CT1 36 (SUTURE) ×9 IMPLANT
SUT VIC AB 2-0 CT1 27 (SUTURE) ×6
SUT VIC AB 2-0 CT1 TAPERPNT 27 (SUTURE) ×3 IMPLANT
SUT VLOC 180 0 24IN GS25 (SUTURE) ×6 IMPLANT
TOWEL OR 17X26 10 PK STRL BLUE (TOWEL DISPOSABLE) ×6 IMPLANT
TOWER CARTRIDGE SMART MIX (DISPOSABLE) ×3 IMPLANT
TRAY FOLEY CATH 14FR (SET/KITS/TRAYS/PACK) ×3 IMPLANT
WATER STERILE IRR 1000ML POUR (IV SOLUTION) ×3 IMPLANT
YANKAUER SUCT BULB TIP 10FT TU (MISCELLANEOUS) IMPLANT

## 2017-12-03 NOTE — Plan of Care (Signed)
Plan of care discussed with patient and husband

## 2017-12-03 NOTE — Brief Op Note (Signed)
12/03/2017  8:45 AM  PATIENT:  Leda Gauze [place]  84 y.o. 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   Delay start of Pharmacological VTE agent (>24hrs) due to surgical blood loss or risk of bleeding: no

## 2017-12-03 NOTE — Anesthesia Procedure Notes (Signed)
Date/Time: 12/03/2017 7:50 AM Performed by: Talbot Grumbling, CRNA Oxygen Delivery Method: Simple face mask

## 2017-12-03 NOTE — Progress Notes (Signed)
mandy on 3 E notified pt will be in room in 20 minutes

## 2017-12-03 NOTE — Discharge Instructions (Signed)

## 2017-12-03 NOTE — Transfer of Care (Signed)
Immediate Anesthesia Transfer of Care Note  Patient: [name]  Procedure(s) Performed: CONVERSION TO LEFT TOTAL HIP (Left Hip)  Patient Location: PACU  Anesthesia Type:MAC and Spinal  Level of Consciousness: awake, alert , oriented and patient cooperative  Airway & Oxygen Therapy: Patient Spontanous Breathing and Patient connected to face mask oxygen  Post-op Assessment: Report given to RN and Post -op Vital signs reviewed and stable  Post vital signs: Reviewed and stable  Last Vitals:  Vitals Value Taken Time  BP    Temp    Pulse    Resp    SpO2      Last Pain:  Vitals:   12/03/17 0637  TempSrc: Oral  PainSc:       Patients Stated Pain Goal: 4 (67/61/95 0932)  Complications: No apparent anesthesia complications

## 2017-12-03 NOTE — Interval H&P Note (Signed)
History and Physical Interval Note:  12/03/2017 7:35 AM  [name]  has presented today for surgery, with the diagnosis of Failed left hip hip hemi  The various methods of treatment have been discussed with the patient and family. After consideration of risks, benefits and other options for treatment, the patient has consented to  Procedure(s) with comments: CONVERSION TO LEFT TOTAL HIP (Left) - 90 mins as a surgical intervention .  The patient's history has been reviewed, patient examined, no change in status, stable for surgery.  I have reviewed the patient's chart and labs.  Questions were answered to the patient's satisfaction.     Mauri Pole

## 2017-12-03 NOTE — Op Note (Signed)
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   PROCEDURE:  Conversion of failed left hip hemiarthroplasty to left total hip arthroplasty utilizing the DePuy size 52 mm Pinnacle shell, 32+4 neutral ALTRX liner and a 32+5 Articuleze metal ball.  SURGEON:  Pietro Cassis. Alvan Dame, MD  ASSISTANT:  Danae Orleans PA-C.  Note that Mr. Guinevere Scarlet was present for the entirety of the case from preoperative positioning, perioperative management of the operative extremity, general facilitation of the case and primary wound closure.  ANESTHESIA:  Spinal.  SPECIMENS:  None.  COMPLICATIONS:  None.  DRAINS:  None.  ESTIMATED BLOOD LOSS:  Less than 100.  INDICATIONS:  The patient is an 84 year old female with history of left hip hemiarthroplasty performed about 6 years ago by one of my partners.  She was seen and evaluated for increasing pain in the hip girdle region.  We went through an extensive workup  for ruling out infection, loosening of the components.  We were trying to differentiate the source of her acetabular pain as opposed to degenerative changes of the lumbar spine.  Based on the workup and continued ongoing clinical challenges of her left  hip, we decided to ultimately proceed with conversion to total hip arthroplasty.  The risk of infection, DVT, dislocation and need for future surgeries were discussed and reviewed but most important, I stressed the potential for persistent pain after the  operation despite her workup and attempts at controlling her pain.  Consent was obtained for benefit of pain relief.  DESCRIPTION OF PROCEDURE:  The patient was  brought to the operative theater.  Once adequate anesthesia, preoperative antibiotics, and vancomycin and gentamicin administered due to a CEPHALOSPORIN allergy, she was also given tranexamic acid and Decadron.   She was positioned into the right lateral decubitus position with the left hip up.  Left lower extremity was then prepped and draped in sterile fashion.  A timeout was performed identifying the patient, the planned procedure and extremity.  Her old incision had been identified, it was excised and then extended posterior for further exposure.  The necessity for revision of case.  Soft tissue planes created to the iliotibial band and gluteal fascia.  This was then incised for posterior  approach.  The posterior aspect of the gluteus muscles and tendons were identified.  The remaining pseudocapsule and remaining muscle attachment were excised.  The posterior aspect of the trochanter.  The posterior aspect of the hip was exposed.  The hip  was dislocated and the femoral head was removed.  Elevated soft tissues off the ilium.  The trunnion was placed on the ileum and retractors were placed anterior, inferior and with a cerebellar retractor posterior.  Soft tissue debridement was carried  out.  I then began reaming with a 44 mm reamer, reamed up to 51 mm reamer where I felt I still had good bony landmarks related to her acetabulum.  We selected a 52 Pinnacle shell.  I impacted this and then assessed the forward flexion and abduction using  the hip guide and felt the abduction was about 35-40 degrees and the forward flexion was adequate at least 20 degrees.  At this point, I did a trial reduction with a 36+4 neutral Ultrex liner,  a  36+5 ball.  I felt that my combined anteversion was a bit  more in terms of internal rotation and thus was no significant concern for impingement definitely with forward flexion and internal rotation and in the sleep position.  She tolerated the extension and external  rotation, but was closed with impingement.  For that reason, I did try to back out about 10 degrees of the acetabular forward flexion.  This was done with a bone tamp checking with the hip guide.  Once I was  satisfied with this, I reimpacted the acetabular shell and then placed a single cancellous screw in the ilium.  Final 15+7 neutral ALTRX liner was then impacted.  I retrialed with a 26+2 ball and felt that the leg lengths were stable.  Her hip was stable  in extension without shuck.  Again, there was no evidence of impingement or subluxation with forward flexion, internal rotation or in the sleep position and the room to the impingement with external extension and external rotation seemed to be improved.   The combined anteversion appeared to be about 50-55 degrees.  Given these findings, we concluded the case.  The hip had been irrigated throughout the case at this point.  I reapproximated the pseudocapsular tissue posteriorly using #1 Vicryl.  The  iliotibial band and gluteal fascia were then reapproximated using #1 Vicryl and Stratafix sutures.  The remainder of the wound was closed with 2-0 Vicryl and a running Monocryl stitch.  The hip was then cleaned, dried and dressed sterilely using surgical  glue and Aquacel dressing.    She was then brought to the recovery room in stable condition tolerating the procedure well.    Findings were reviewed with her husband.    PLAN:  She will remain in the hospital for 1-2 nights to work on mobilization for home discharge.  AN/NUANCE  D:12/03/2017 T:12/03/2017 JOB:[id number]

## 2017-12-03 NOTE — Anesthesia Procedure Notes (Signed)
Spinal  Patient location during procedure: OR Start time: 12/03/2017 7:43 AM Staffing Anesthesiologist: Josephine Igo, MD Performed: anesthesiologist  Preanesthetic Checklist Completed: patient identified, site marked, surgical consent, pre-op evaluation, timeout performed, IV checked, risks and benefits discussed and monitors and equipment checked Spinal Block Patient position: sitting Prep: site prepped and draped and DuraPrep Patient monitoring: heart rate, cardiac monitor, continuous pulse ox and blood pressure Approach: midline Location: L3-4 Injection technique: single-shot Needle Needle type: Pencan  Needle gauge: 24 G Needle length: 9 cm Needle insertion depth: 6 cm Assessment Sensory level: T4 Additional Notes Patient tolerated procedure well. Adequate sensory level.

## 2017-12-03 NOTE — Anesthesia Postprocedure Evaluation (Signed)
Anesthesia Post Note  Patient: Set designer  Procedure(s) Performed: CONVERSION TO LEFT TOTAL HIP (Left Hip)     Patient location during evaluation: PACU Anesthesia Type: Spinal Level of consciousness: oriented and awake and alert Pain management: pain level controlled Vital Signs Assessment: post-procedure vital signs reviewed and stable Respiratory status: spontaneous breathing, respiratory function stable and nonlabored ventilation Cardiovascular status: blood pressure returned to baseline and stable Postop Assessment: no headache, no backache, no apparent nausea or vomiting, spinal receding and patient able to bend at knees Anesthetic complications: no    Last Vitals:  Vitals:   12/03/17 1045 12/03/17 1100  BP: (!) 128/54   Pulse: 76   Resp: 18   Temp: (!) 36.2 C (!) 36.3 C  SpO2: 100%     Last Pain:  Vitals:   12/03/17 1045  TempSrc:   PainSc: 0-No pain                 Airik Goodlin A.

## 2017-12-03 NOTE — Evaluation (Signed)
Physical Therapy Evaluation Patient Details Name: [name] MRN: [id number] DOB: [birth year] Today's Date: 12/03/2017   History of Present Illness  84 YO female s/p L posterior THA. Failed L hip hemi in 2018. Past surgical history includes R TKA in 2014, bilateral mastectomy in 1989-1990. PMH includes HTN, diastolic CHF, CAD, fall, hx of DVT, PVD, hyperlipidemia, DJD, GERD, asthma, osteoporosis, and breast cancer.     Clinical Impression  Pt is a 84 YO female s/p L posterior THA, revised from L hip hemi in 2018. Pt presents with decreased tolerance for ambulation, L hip pain, and decreased knowledge of precautions which are limiting her ability to return to PLOF. Acute PT to address deficits and improve tolerance for activity. Pt may need OT consult, but will follow up tomorrow to determine need considering pt is comfortable with activity modifications and tool use for posterior hip surgery, given recency of last surgery. Will continue to progress mobility as pt tolerance improves.     Follow Up Recommendations Follow surgeon's recommendation for DC plan and follow-up therapies    Equipment Recommendations   none    Recommendations for Other Services       Precautions / Restrictions Precautions Precautions: Posterior Hip Precaution Comments: handout issued and precautions reviewed with pt  Restrictions Weight Bearing Restrictions: Yes Other Position/Activity Restrictions: WBAT       Mobility  Bed Mobility Overal bed mobility: Needs Assistance Bed Mobility: Supine to Sit     Supine to sit: Min assist     General bed mobility comments: min assist for LLE management   Transfers Overall transfer level: Needs assistance Equipment used: Rolling walker (2 wheeled) Transfers: Sit to/from Stand Sit to Stand: Min assist;+2 safety/equipment         General transfer comment: mod verbal cuing for maintaining hip precautions, LLE placement   Ambulation/Gait Ambulation/Gait  assistance: Min assist Gait Distance (Feet): 40 Feet Assistive device: Rolling walker (2 wheeled) Gait Pattern/deviations: Step-to pattern;Decreased step length - left;Decreased step length - right;Trunk flexed Gait velocity: decreased       Stairs            Wheelchair Mobility    Modified Rankin (Stroke Patients Only)       Balance Overall balance assessment: Mild deficits observed, not formally tested                                           Pertinent Vitals/Pain Pain Assessment: 0-10 Pain Score: 4  Pain Descriptors / Indicators: Aching;Burning Pain Intervention(s): Monitored during session;Ice applied    Home Living Family/patient expects to be discharged to:: Private residence Living Arrangements: Spouse/significant other Available Help at Discharge: Family(spouse had stroke within the last year) Type of Home: House Home Access: Stairs to enter Entrance Stairs-Rails: Right Entrance Stairs-Number of Steps: 2 Home Layout: One level Home Equipment: [name] - 2 wheels;Bedside commode;Tub bench;Cane - single point Additional Comments: reacher     Prior Function Level of Independence: Independent with assistive device(s)         Comments: used cane intermittently      Hand Dominance        Extremity/Trunk Assessment        Lower Extremity Assessment Lower Extremity Assessment: LLE deficits/detail;Generalized weakness LLE Deficits / Details: Incision along posterolateral thigh, generalized LE weakness       Communication   Communication: No difficulties  Cognition  Arousal/Alertness: Awake/alert Behavior During Therapy: WFL for tasks assessed/performed Overall Cognitive Status: Within Functional Limits for tasks assessed                                        General Comments      Exercises     Assessment/Plan    PT Assessment Patient needs continued PT services  PT Problem List Decreased  strength;Decreased activity tolerance;Decreased safety awareness;Decreased knowledge of precautions;Decreased mobility       PT Treatment Interventions DME instruction;Balance training;Gait training;Stair training;Functional mobility training;Therapeutic activities;Therapeutic exercise    PT Goals (Current goals can be found in the Care Plan section)  Acute Rehab PT Goals Patient Stated Goal: increase tolerance for ambulation and exercises  PT Goal Formulation: With patient Time For Goal Achievement: 12/10/17 Potential to Achieve Goals: Good    Frequency 7X/week   Barriers to discharge        Co-evaluation               AM-PAC PT "6 Clicks" Daily Activity  Outcome Measure Difficulty turning over in bed (including adjusting bedclothes, sheets and blankets)?: A Little Difficulty moving from lying on back to sitting on the side of the bed? : Unable Difficulty sitting down on and standing up from a chair with arms (e.g., wheelchair, bedside commode, etc,.)?: Unable Help needed moving to and from a bed to chair (including a wheelchair)?: A Little Help needed walking in hospital room?: A Little Help needed climbing 3-5 steps with a railing? : A Lot 6 Click Score: 13    End of Session Equipment Utilized During Treatment: Gait belt Activity Tolerance: Patient tolerated treatment well;No increased pain;Patient limited by fatigue Patient left: in chair;with call bell/phone within reach;with chair alarm set Nurse Communication: Mobility status PT Visit Diagnosis: Difficulty in walking, not elsewhere classified (R26.2);Muscle weakness (generalized) (M62.81)    Time: 9628-3662 PT Time Calculation (min) (ACUTE ONLY): 23 min   Charges:   PT Evaluation $PT Eval Low Complexity: 1 Low PT Treatments $Gait Training: 8-22 mins      Hasson Gaspard D Vince Ainsley PT DPT   Ammy Lienhard D Hallel Denherder 12/03/2017, 5:19 PM

## 2017-12-04 ENCOUNTER — Encounter (HOSPITAL_COMMUNITY): Payer: Self-pay | Admitting: Orthopedic Surgery

## 2017-12-04 LAB — BASIC METABOLIC PANEL
ANION GAP: 5 (ref 5–15)
BUN: 20 mg/dL (ref 8–23)
CO2: 27 mmol/L (ref 22–32)
Calcium: 8 mg/dL — ABNORMAL LOW (ref 8.9–10.3)
Chloride: 103 mmol/L (ref 98–111)
Creatinine, Ser: 1.16 mg/dL — ABNORMAL HIGH (ref 0.44–1.00)
GFR calc Af Amer: 49 mL/min — ABNORMAL LOW (ref >60)
GFR, EST NON AFRICAN AMERICAN: 43 mL/min — AB (ref >60)
GLUCOSE: 143 mg/dL — AB (ref 70–99)
POTASSIUM: 4 mmol/L (ref 3.5–5.1)
Sodium: 135 mmol/L (ref 135–145)

## 2017-12-04 LAB — CBC
HEMATOCRIT: 30.5 % — AB (ref 36.0–46.0)
HEMOGLOBIN: 10 g/dL — AB (ref 12.0–15.0)
MCH: 30.3 pg (ref 26.0–34.0)
MCHC: 32.8 g/dL (ref 30.0–36.0)
MCV: 92.4 fL (ref 78.0–100.0)
Platelets: 191 10*3/uL (ref 150–400)
RBC: 3.3 MIL/uL — AB (ref 3.87–5.11)
RDW: 12.8 % (ref 11.5–15.5)
WBC: 6.7 10*3/uL (ref 4.0–10.5)

## 2017-12-04 NOTE — Progress Notes (Signed)
[facility] following PTA from office if [facility] needed rep Ronalee Belts aware. Patient already has rw,3n1.

## 2017-12-04 NOTE — Progress Notes (Signed)
Patient ID: [name], female   DOB: 1932-09-14, 84 y.o.   MRN: [id number]   Subjective: 1 Day Post-Op Procedure(s) (LRB): CONVERSION TO LEFT TOTAL HIP (Left)    Patient reports pain as mild.  Up out of bed yesterday and feeling better   Objective:   VITALS:   Vitals:   12/04/17 0221 12/04/17 0601  BP: (!) 126/56 (!) 131/57  Pulse: 75 73  Resp: 16 16  Temp: 97.7 F (36.5 C) (!) 97.5 F (36.4 C)  SpO2: 100% 100%    Neurovascular intact Incision: dressing C/D/I  LABS Recent Labs    12/04/17 0457  HGB 10.0*  HCT 30.5*  WBC 6.7  PLT 191    Recent Labs    12/04/17 0457  NA 135  K 4.0  BUN 20  CREATININE 1.16*  GLUCOSE 143*    No results for input(s): LABPT, INR in the last 72 hours.   Assessment/Plan: 1 Day Post-Op Procedure(s) (LRB): CONVERSION TO LEFT TOTAL HIP (Left)   Advance diet Up with therapy Plan for discharge tomorrow   Maintain O2 to allow for lungs to wake up more RTC in 2 weeks WBAT LLE

## 2017-12-04 NOTE — Progress Notes (Signed)
Noted PT recc f/u per surgeons recc for d/c.

## 2017-12-04 NOTE — Progress Notes (Signed)
Physical Therapy Treatment Patient Details Name: [name] MRN: [id number] DOB: [birth year] Today's Date: 12/04/2017    History of Present Illness 84 YO female s/p L posterior THA revision. Failed L hip hemi in 2018. Past surgical history includes R TKA in 2014, bilateral mastectomy due to breast cancer in 1989-1990. PMH includes HTN, diastolic CHF, CAD, fall, hx of DVT, PVD, DJD, GERD, asthma, osteoporosis.    PT Comments    Pt tolerated mobility well today, with "little to no pain" during session. Pt with increased ambulation distance and improved gait, seen in step-through pattern and increased gait speed. Pt still requiring assist to recall precautions, continue to reinforce. Will continue to follow with acute PT.    Follow Up Recommendations  Follow surgeon's recommendation for DC plan and follow-up therapies     Equipment Recommendations       Recommendations for Other Services       Precautions / Restrictions Precautions Precautions: Posterior Hip Precaution Comments: handout issued and precautions reviewed with pt  Restrictions Weight Bearing Restrictions: No Other Position/Activity Restrictions: WBAT     Mobility  Bed Mobility Overal bed mobility: Needs Assistance Bed Mobility: Supine to Sit     Supine to sit: Min assist     General bed mobility comments: min assist for LLE management, verbal cuing to not lean trunk anteriorly to maintain hip precautions   Transfers Overall transfer level: Needs assistance Equipment used: Rolling walker (2 wheeled) Transfers: Sit to/from Stand Sit to Stand: Min guard         General transfer comment: verbal cuing for power up and LLE placement   Ambulation/Gait Ambulation/Gait assistance: Min assist Gait Distance (Feet): 270 Feet Assistive device: Rolling walker (2 wheeled) Gait Pattern/deviations: Decreased step length - left;Decreased step length - right;Decreased stance time - left;Step-through pattern;Step-to  pattern Gait velocity: decreased    General Gait Details: step-to pattern, transitioning to step-through pattern after first 50 feet of ambulation. Min verbal cuing for L hip ER and placement in RW    Stairs             Wheelchair Mobility    Modified Rankin (Stroke Patients Only)       Balance Overall balance assessment: Mild deficits observed, not formally tested                                          Cognition Arousal/Alertness: Awake/alert Behavior During Therapy: WFL for tasks assessed/performed Overall Cognitive Status: Within Functional Limits for tasks assessed                                        Exercises Total Joint Exercises Ankle Circles/Pumps: AROM;Both;20 reps;Supine Quad Sets: AROM;Both;10 reps;Supine Heel Slides: AAROM;Left;15 reps;Supine Hip ABduction/ADduction: AAROM;Left;10 reps;Supine    General Comments        Pertinent Vitals/Pain Pain Assessment: 0-10 Pain Score: 1  Pain Location: L hip  Pain Descriptors / Indicators: Aching Pain Intervention(s): Monitored during session;Ice applied    Home Living                      Prior Function            PT Goals (current goals can now be found in the care plan section) Acute Rehab PT Goals Patient  Stated Goal: increase tolerance for ambulation and exercises  PT Goal Formulation: With patient Time For Goal Achievement: 12/10/17 Potential to Achieve Goals: Good Progress towards PT goals: Progressing toward goals    Frequency    7X/week      PT Plan Current plan remains appropriate    Co-evaluation              AM-PAC PT "6 Clicks" Daily Activity  Outcome Measure  Difficulty turning over in bed (including adjusting bedclothes, sheets and blankets)?: A Little Difficulty moving from lying on back to sitting on the side of the bed? : Unable Difficulty sitting down on and standing up from a chair with arms (e.g., wheelchair,  bedside commode, etc,.)?: A Little Help needed moving to and from a bed to chair (including a wheelchair)?: A Little Help needed walking in hospital room?: A Little Help needed climbing 3-5 steps with a railing? : A Lot 6 Click Score: 15    End of Session Equipment Utilized During Treatment: Gait belt Activity Tolerance: Patient tolerated treatment well;No increased pain Patient left: in chair;with call bell/phone within reach;with chair alarm set(ice applied ) Nurse Communication: Mobility status PT Visit Diagnosis: Difficulty in walking, not elsewhere classified (R26.2);Muscle weakness (generalized) (M62.81)     Time: 8756-4332 PT Time Calculation (min) (ACUTE ONLY): 30 min  Charges:  $Gait Training: 8-22 mins $Therapeutic Exercise: 8-22 mins                     Alydia Gosser D Perley Arthurs PT DPT   Adilen Pavelko D Dajae Kizer 12/04/2017, 12:22 PM

## 2017-12-04 NOTE — Progress Notes (Signed)
Vitals stable, 2 Liters O2 overnight. Pain well controlled/

## 2017-12-04 NOTE — Progress Notes (Signed)
Physical Therapy Treatment Patient Details Name: [name] MRN: [id number] DOB: [birth year] Today's Date: 12/04/2017    History of Present Illness 84 YO female s/p L posterior THA revision. Failed L hip hemi in 2018. Past surgical history includes R TKA in 2014, bilateral mastectomy due to breast cancer in 1989-1990. PMH includes HTN, diastolic CHF, CAD, fall, hx of DVT, PVD, DJD, GERD, asthma, osteoporosis.    PT Comments    Pt progressing well with mobility, demonstrated in pt's ambulation, bed mobility, and transfers today.  Pt with little to no complaints of L hip pain, which does not change with activity. Pt demonstrated proficiency in step navigation with RW. Additionally, pt with 100% recall of posterior hip precautions throughout session. Pt plans to d/c tomorrow.   Follow Up Recommendations  Follow surgeon's recommendation for DC plan and follow-up therapies     Equipment Recommendations       Recommendations for Other Services       Precautions / Restrictions Precautions Precautions: Posterior Hip Precaution Comments: handout issued and precautions reviewed with pt  Restrictions Weight Bearing Restrictions: No Other Position/Activity Restrictions: WBAT     Mobility  Bed Mobility Overal bed mobility: Needs Assistance Bed Mobility: Supine to Sit     Supine to sit: Min assist     General bed mobility comments: min assist for LLE management, verbal cuing to not lean trunk anteriorly to maintain hip precautions   Transfers Overall transfer level: Needs assistance Equipment used: Rolling walker (2 wheeled) Transfers: Sit to/from Stand Sit to Stand: Min guard         General transfer comment: verbal cuing for LLE placement, maintaining hip precautions with forward trunk translation when standing   Ambulation/Gait Ambulation/Gait assistance: Min assist Gait Distance (Feet): 240 Feet Assistive device: Rolling walker (2 wheeled) Gait Pattern/deviations:  Decreased stance time - left;Step-through pattern;Trunk flexed Gait velocity: slightly decreased    General Gait Details: step-through gait for duration of session. Verbal cuing for maintaining precautions when turning towards L    Stairs Stairs: Yes Stairs assistance: Supervision;Min guard Stair Management: No rails Number of Stairs: 1 General stair comments: no verbal cuing required for sequencing, increased time, RW used during step training    Wheelchair Mobility    Modified Rankin (Stroke Patients Only)       Balance Overall balance assessment: Mild deficits observed, not formally tested                                          Cognition Arousal/Alertness: Awake/alert Behavior During Therapy: WFL for tasks assessed/performed Overall Cognitive Status: Within Functional Limits for tasks assessed                                        Exercises     General Comments        Pertinent Vitals/Pain Pain Assessment: 0-10 Pain Score: 1  Pain Location: L hip  Pain Descriptors / Indicators: Aching;Sore Pain Intervention(s): Monitored during session;Premedicated before session;Limited activity within patient's tolerance    Home Living                      Prior Function            PT Goals (current goals can now be found in the  care plan section) Acute Rehab PT Goals Patient Stated Goal: increase tolerance for ambulation and exercises  PT Goal Formulation: With patient Time For Goal Achievement: 12/10/17 Potential to Achieve Goals: Good Progress towards PT goals: Progressing toward goals    Frequency    7X/week      PT Plan Current plan remains appropriate    Co-evaluation              AM-PAC PT "6 Clicks" Daily Activity  Outcome Measure  Difficulty turning over in bed (including adjusting bedclothes, sheets and blankets)?: A Little Difficulty moving from lying on back to sitting on the side of the  bed? : Unable Difficulty sitting down on and standing up from a chair with arms (e.g., wheelchair, bedside commode, etc,.)?: A Little Help needed moving to and from a bed to chair (including a wheelchair)?: A Little Help needed walking in hospital room?: A Little Help needed climbing 3-5 steps with a railing? : A Little 6 Click Score: 16    End of Session Equipment Utilized During Treatment: Gait belt Activity Tolerance: Patient tolerated treatment well;No increased pain Patient left: in chair;with call bell/phone within reach;with chair alarm set(ice applied ) Nurse Communication: Mobility status PT Visit Diagnosis: Difficulty in walking, not elsewhere classified (R26.2);Muscle weakness (generalized) (M62.81)     Time: 1300-1320 PT Time Calculation (min) (ACUTE ONLY): 20 min  Charges:  $Gait Training: 8-22 mins $Therapeutic Exercise: 8-22 mins                     Sophira Rumler D Oretha Weismann PT DPT    Boruch Manuele D Varetta Chavers 12/04/2017, 2:59 PM

## 2017-12-05 LAB — BASIC METABOLIC PANEL
Anion gap: 7 (ref 5–15)
BUN: 28 mg/dL — ABNORMAL HIGH (ref 8–23)
CALCIUM: 8.6 mg/dL — AB (ref 8.9–10.3)
CO2: 28 mmol/L (ref 22–32)
CREATININE: 1.38 mg/dL — AB (ref 0.44–1.00)
Chloride: 103 mmol/L (ref 98–111)
GFR calc non Af Amer: 35 mL/min — ABNORMAL LOW (ref >60)
GFR, EST AFRICAN AMERICAN: 40 mL/min — AB (ref >60)
GLUCOSE: 107 mg/dL — AB (ref 70–99)
Potassium: 4.7 mmol/L (ref 3.5–5.1)
Sodium: 138 mmol/L (ref 135–145)

## 2017-12-05 LAB — CBC
HEMATOCRIT: 31.6 % — AB (ref 36.0–46.0)
Hemoglobin: 10.2 g/dL — ABNORMAL LOW (ref 12.0–15.0)
MCH: 29.8 pg (ref 26.0–34.0)
MCHC: 32.3 g/dL (ref 30.0–36.0)
MCV: 92.4 fL (ref 78.0–100.0)
PLATELETS: 217 10*3/uL (ref 150–400)
RBC: 3.42 MIL/uL — ABNORMAL LOW (ref 3.87–5.11)
RDW: 13.3 % (ref 11.5–15.5)
WBC: 7.8 10*3/uL (ref 4.0–10.5)

## 2017-12-05 NOTE — Evaluation (Signed)
Physical Therapy Evaluation Patient Details Name: [name] MRN: [id number] DOB: 30-Dec-1932 Today's Date: 12/05/2017   History of Present Illness  84 YO female s/p L posterior THA revision. Failed L hip hemi in 2018. Past surgical history includes R TKA in 2014, bilateral mastectomy due to breast cancer in 1989-1990. PMH includes HTN, diastolic CHF, CAD, fall, hx of DVT, PVD, DJD, GERD, asthma, osteoporosis.  Clinical Impression  Pt very motivated and eager for dc home.  Reviewed therex and car transfers.      Follow Up Recommendations Follow surgeon's recommendation for DC plan and follow-up therapies    Equipment Recommendations  None recommended by PT    Recommendations for Other Services       Precautions / Restrictions Precautions Precautions: Posterior Hip Precaution Comments: Pt recalls all THP without cues Restrictions Weight Bearing Restrictions: No LLE Weight Bearing: Weight bearing as tolerated      Mobility  Bed Mobility Overal bed mobility: Needs Assistance Bed Mobility: Sit to Supine       Sit to supine: Min assist   General bed mobility comments: min cues and min assist for LLE  Transfers Overall transfer level: Needs assistance Equipment used: Rolling walker (2 wheeled) Transfers: Sit to/from Stand Sit to Stand: Supervision         General transfer comment: Pt self cues for LE management and use of UEs to self assist  Ambulation/Gait Ambulation/Gait assistance: Min guard;Supervision Gait Distance (Feet): 150 Feet Assistive device: Rolling walker (2 wheeled) Gait Pattern/deviations: Decreased stance time - left;Step-through pattern;Trunk flexed Gait velocity: slightly decreased    General Gait Details: min cues for position from [employer] Mobility    Modified Rankin (Stroke Patients Only)       Balance Overall balance assessment: Mild deficits observed, not formally tested                                           Pertinent Vitals/Pain Pain Assessment: 0-10 Pain Score: 2  Pain Location: L hip  Pain Descriptors / Indicators: Aching;Sore Pain Intervention(s): Limited activity within patient's tolerance;Monitored during session;Premedicated before session;Ice applied    Home Living                        Prior Function                 Hand Dominance        Extremity/Trunk Assessment                Communication      Cognition Arousal/Alertness: Awake/alert Behavior During Therapy: WFL for tasks assessed/performed Overall Cognitive Status: Within Functional Limits for tasks assessed                                        General Comments      Exercises Total Joint Exercises Ankle Circles/Pumps: AROM;Both;20 reps;Supine Quad Sets: AROM;Both;10 reps;Supine Short Arc Quad: AROM;Left;15 reps;Supine Heel Slides: AAROM;Left;Supine;20 reps Hip ABduction/ADduction: AAROM;Left;Supine;15 reps   Assessment/Plan    PT Assessment    PT Problem List         PT Treatment Interventions      PT Goals (Current goals can be found in  the Care Plan section)  Acute Rehab PT Goals Patient Stated Goal: increase tolerance for ambulation and exercises  PT Goal Formulation: With patient Time For Goal Achievement: 12/10/17 Potential to Achieve Goals: Good    Frequency 7X/week   Barriers to discharge        Co-evaluation               AM-PAC PT "6 Clicks" Daily Activity  Outcome Measure Difficulty turning over in bed (including adjusting bedclothes, sheets and blankets)?: A Little Difficulty moving from lying on back to sitting on the side of the bed? : A Lot Difficulty sitting down on and standing up from a chair with arms (e.g., wheelchair, bedside commode, etc,.)?: A Lot Help needed moving to and from a bed to chair (including a wheelchair)?: A Little Help needed walking in hospital room?: A Little Help needed  climbing 3-5 steps with a railing? : A Little 6 Click Score: 16    End of Session Equipment Utilized During Treatment: Gait belt Activity Tolerance: Patient tolerated treatment well;No increased pain Patient left: in bed;with call bell/phone within reach;with family/visitor present Nurse Communication: Mobility status PT Visit Diagnosis: Difficulty in walking, not elsewhere classified (R26.2);Muscle weakness (generalized) (M62.81)    Time: 0254-2706 PT Time Calculation (min) (ACUTE ONLY): 28 min   Charges:     PT Treatments $Gait Training: 8-22 mins $Therapeutic Exercise: 8-22 mins        Pg [phone]   Krisanne Lich 12/05/2017, 12:50 PM

## 2017-12-05 NOTE — Progress Notes (Signed)
Discussed with patient and spouse discharge instructions, both verbalized agreement and understanding.  Patient to go home in private vehicle with all belongings. 

## 2017-12-05 NOTE — Progress Notes (Signed)
[name]  MRN: [id number] DOB/Age: [birth year] 82 y.o. Physician: [name], M.D. 2 Days Post-Op Procedure(s) (LRB): CONVERSION TO LEFT TOTAL HIP (Left)  Subjective: rested well last night, pain now greatly improved compared to pre op. Vital Signs Temp:  [97.7 F (36.5 C)-98.1 F (36.7 C)] 97.7 F (36.5 C) (07/27 0524) Pulse Rate:  [75-80] 78 (07/27 0524) Resp:  [14-16] 16 (07/27 0524) BP: (128-159)/(55-66) 151/65 (07/27 0524) SpO2:  [96 %-99 %] 98 % (07/27 0834)  Lab Results Recent Labs    12/04/17 0457 12/05/17 0513  WBC 6.7 7.8  HGB 10.0* 10.2*  HCT 30.5* 31.6*  PLT 191 217   BMET Recent Labs    12/04/17 0457 12/05/17 0513  NA 135 138  K 4.0 4.7  CL 103 103  CO2 27 28  GLUCOSE 143* 107*  BUN 20 28*  CREATININE 1.16* 1.38*  CALCIUM 8.0* 8.6*   INR  Date Value Ref Range Status  12/22/2016 1.18  Final     Exam  Dressing intact, dry, thigh soft, N/V intact distally LLE  Plan D/c home today [name] M [name] 12/05/2017, 8:39 AM    Contact # [phone]

## 2017-12-07 DIAGNOSIS — N183 Chronic kidney disease, stage 3 (moderate): Secondary | ICD-10-CM | POA: Diagnosis not present

## 2017-12-07 DIAGNOSIS — I2510 Atherosclerotic heart disease of native coronary artery without angina pectoris: Secondary | ICD-10-CM | POA: Diagnosis not present

## 2017-12-07 DIAGNOSIS — I5032 Chronic diastolic (congestive) heart failure: Secondary | ICD-10-CM | POA: Diagnosis not present

## 2017-12-07 DIAGNOSIS — I130 Hypertensive heart and chronic kidney disease with heart failure and stage 1 through stage 4 chronic kidney disease, or unspecified chronic kidney disease: Secondary | ICD-10-CM | POA: Diagnosis not present

## 2017-12-07 DIAGNOSIS — I739 Peripheral vascular disease, unspecified: Secondary | ICD-10-CM | POA: Diagnosis not present

## 2017-12-07 DIAGNOSIS — T8489XD Other specified complication of internal orthopedic prosthetic devices, implants and grafts, subsequent encounter: Secondary | ICD-10-CM | POA: Diagnosis not present

## 2017-12-08 NOTE — Discharge Summary (Signed)
Physician Discharge Summary  Patient ID: [name] MRN: [id number] DOB/AGE: [birth year] 84 y.o.  Admit date: 12/03/2017 Discharge date: 12/05/2017   Procedures:  Procedure(s) (LRB): CONVERSION TO LEFT TOTAL HIP (Left)  Attending Physician:  Dr. Paralee Cancel   Admission Diagnoses:   Failed left hip hemiarthroplasty  Discharge Diagnoses:  Principal Problem:   S/P left TH revision  Past Medical History:  Diagnosis Date  . Asthma, severe persistent    pulmologist-- dr Lynford Citizen  . Chronic kidney disease, stage II (mild)   . DDD (degenerative disc disease), cervical   . Diverticulosis yrs ago   hx of   . Edema, lower extremity    occ both legs swell  . GERD (gastroesophageal reflux disease)   . Heart murmur   . History of breast cancer 1990 left mastectomy also   1989  S/P   RIGHT MASTECTOMY ;  NO CHEMORADIATION //   NO RECURRENCE  . History of DVT of lower extremity 5 yrs ago   right leg  . History of shingles   . Hyperlipidemia   . Leg ulcer, left ([place])   . Osteoporosis, unspecified    Knee and hip osteoarthritis bilaterally  . Perennial allergic rhinitis   . Restless legs syndrome (RLS)   . Rheumatoid arthritis ([place])    hands  . Thyroid nodule    followed by dr perrini yearly, no current problam  . Unspecified essential hypertension     HPI:    [name], 84 y.o. female, has a history of pain and functional disability in the left hip due to trauma and patient has failed non-surgical conservative treatments for greater than 12 weeks to include NSAID's and/or analgesics, use of assistive devices and activity modification. The indications for the revision total hip arthroplasty are bearing surface wear leading to  symptomatic synovitis. Onset of symptoms was gradual starting <1 year ago with gradually worsening course since that time.  Prior procedures on the left hip include hemi-arthroplasty. Patient currently rates pain in the left hip at 10 out of 10 with  activity.  There is night pain, worsening of pain with activity and weight bearing, trendelenberg gait, pain that interfers with activities of daily living and pain with passive range of motion. Patient has evidence of previous left hip hemiarthroplasty by imaging studies.  This condition presents safety issues increasing the risk of falls.  There is no current active infection.  Risks, benefits and expectations were discussed with the patient.  Risks including but not limited to the risk of anesthesia, blood clots, nerve damage, blood vessel damage, failure of the prosthesis, infection and up to and including death.  Patient understand the risks, benefits and expectations and wishes to proceed with surgery.   PCP: Crist Infante, MD   Discharged Condition: good  Hospital Course:  Patient underwent the above stated procedure on 12/03/2017. Patient tolerated the procedure well and brought to the recovery room in good condition and subsequently to the floor.  POD #1 BP: 131/57 ; Pulse: 73 ; Temp: 97.5 F (36.4 C) ; Resp: 16 Patient reports pain as mild.  Up out of bed yesterday and feeling better Neurovascular intact and incision: dressing C/D/I.   LABS  Basename    HGB     10.0  HCT     30.5   POD #2  BP: 151/65 ; Pulse: 78 ; Temp: 97.7 F (36.5 C) ; Resp: 16 rested well last night, pain now greatly improved compared to pre op. Dressing  intact, dry, thigh soft, N/V intact distally LLE.  LABS  Basename    HGB     10.2  HCT     31.6    Discharge Exam: General appearance: alert, cooperative and no distress Extremities: Homans sign is negative, no sign of DVT, no edema, redness or tenderness in the calves or thighs and no ulcers, gangrene or trophic changes  Disposition:  Home with follow up in 2 weeks   Follow-up Information    Paralee Cancel, MD. Schedule an appointment as soon as possible for a visit in 2 weeks.   Specialty:  Orthopedic Surgery Contact information: [address] [place] [address] [phone]        [facility], [facility] Follow up.   Specialty:  [place] Why:  [place] will call to arrange appt Contact information: [place] [address] [phone]           Discharge Instructions    Call MD / Call 911   Complete by:  As directed    If you experience chest pain or shortness of breath, CALL 911 and be transported to the hospital emergency room.  If you develope a fever above 101 F, pus (white drainage) or increased drainage or redness at the wound, or calf pain, call your surgeon's office.   Change dressing   Complete by:  As directed    Maintain surgical dressing until follow up in the clinic. If the edges start to pull up, may reinforce with tape. If the dressing is no longer working, may remove and cover with gauze and tape, but must keep the area dry and clean.  Call with any questions or concerns.   Constipation Prevention   Complete by:  As directed    Drink plenty of fluids.  Prune juice may be helpful.  You may use a stool softener, such as Colace (over the counter) 100 mg twice a day.  Use MiraLax (over the counter) for constipation as needed.   Diet - low sodium heart healthy   Complete by:  As directed    Discharge instructions   Complete by:  As directed    Maintain surgical dressing until follow up in the clinic. If the edges start to pull up, may reinforce with tape. If the dressing is no longer working, may remove and cover with gauze and tape, but must keep the area dry and clean.  Follow up in 2 weeks at [facility]. Call with any questions or concerns.   Increase activity slowly as tolerated   Complete by:  As directed    Weight bearing as tolerated with assist device (walker, cane, etc) as directed, use it as long as suggested by your surgeon or therapist, typically at least 4-6 weeks.   TED hose   Complete by:  As directed     Use stockings (TED hose) for 2 weeks on both leg(s).  You may remove them at night for sleeping.      Allergies as of 12/05/2017      Reactions   Gabapentin Nausea Only   Eggs Or Egg-derived Products    RAW EGGS.. Can eat cooked eggs   Influenza Vaccines    Egg allergy   Nucala [mepolizumab]    Cephalosporins Rash   Codeine Rash   Levofloxacin Rash   Pneumococcal Vaccines Rash      Medication List    TAKE these medications   acetaminophen  500 MG tablet Commonly known as:  TYLENOL Take 2 tablets (1,000 mg total) by mouth every 8 (eight) hours. What changed:    how much to take  when to take this  reasons to take this   AEROCHAMBER MV inhaler by Other route. Use as instructed   albuterol 108 (90 Base) MCG/ACT inhaler Commonly known as:  PROAIR HFA Inhale 2 puffs into the lungs every 6 (six) hours as needed for wheezing or shortness of breath.   albuterol (2.5 MG/3ML) 0.083% nebulizer solution Commonly known as:  PROVENTIL Take 3 mLs (2.5 mg total) by nebulization every 6 (six) hours as needed for wheezing or shortness of breath. Dx: J45.41   benzonatate 100 MG capsule Commonly known as:  TESSALON Take 1 capsule (100 mg total) by mouth 3 (three) times daily as needed for cough.   budesonide-formoterol 160-4.5 MCG/ACT inhaler Commonly known as:  SYMBICORT Inhale 2 puffs into the lungs 2 (two) times daily.   CALTRATE 600+D 600-400 MG-UNIT tablet Generic drug:  Calcium Carbonate-Vitamin D Take 1 tablet by mouth daily.   chlorpheniramine 4 MG tablet Commonly known as:  CHLOR-TRIMETON Take by mouth. 4 mg in the morning and 8 mg at bedtime   cholecalciferol 1000 units tablet Commonly known as:  VITAMIN D Take 1,000 Units by mouth every evening.   dextromethorphan 30 MG/5ML liquid Commonly known as:  DELSYM Take 60 mg by mouth 2 (two) times daily as needed for cough.   dextromethorphan-guaiFENesin 30-600 MG 12hr tablet Commonly known as:  MUCINEX DM Take 1  tablet by mouth daily as needed for cough.   docusate sodium 100 MG capsule Commonly known as:  COLACE Take 1 capsule (100 mg total) by mouth 2 (two) times daily.   Dupilumab 300 MG/2ML Sosy Commonly known as:  DUPIXENT Inject 300 mg into the skin every 14 (fourteen) days.   esomeprazole 40 MG capsule Commonly known as:  NEXIUM Take 1 capsule (40 mg total) by mouth 2 (two) times daily. Name brand only   ferrous sulfate 325 (65 FE) MG tablet Commonly known as:  FERROUSUL Take 1 tablet (325 mg total) by mouth 3 (three) times daily with meals.   fexofenadine 180 MG tablet Commonly known as:  ALLEGRA Take 180 mg by mouth daily.   furosemide 20 MG tablet Commonly known as:  LASIX Take 1 tablet (20 mg total) daily as needed by mouth for fluid or edema. What changed:  when to take this   KLOR-CON 10 10 MEQ tablet Generic drug:  potassium chloride TAKE 1 TABLET BY MOUTH EVERY DAY What changed:    how much to take  how to take this  when to take this   methocarbamol 500 MG tablet Commonly known as:  ROBAXIN Take 1 tablet (500 mg total) by mouth every 6 (six) hours as needed for muscle spasms.   mometasone 50 MCG/ACT nasal spray Commonly known as:  NASONEX Place 2 sprays into the nose daily.   montelukast 10 MG tablet Commonly known as:  SINGULAIR Take 1 tablet (10 mg total) by mouth at bedtime.   multivitamin tablet Take 1 tablet by mouth daily.   polyethylene glycol packet Commonly known as:  MIRALAX / GLYCOLAX Take 17 g by mouth 2 (two) times daily.   pramipexole 0.125 MG tablet Commonly known as:  MIRAPEX Take 0.375 mg by mouth at bedtime.   rosuvastatin 5 MG tablet Commonly known as:  CRESTOR TAKE 1 TABLET BY MOUTH EVERYDAY AT BEDTIME  telmisartan 20 MG tablet Commonly known as:  MICARDIS TAKE 1 TABLET BY MOUTH AT BEDTIME   Tiotropium Bromide Monohydrate 2.5 MCG/ACT Aers Commonly known as:  SPIRIVA RESPIMAT Inhale 2 puffs into the lungs daily.     traMADol 50 MG tablet Commonly known as:  ULTRAM Take 1 tablet (50 mg total) by mouth every 6 (six) hours as needed.   vitamin B-12 500 MCG tablet Commonly known as:  CYANOCOBALAMIN Take 500 mcg by mouth daily.   XARELTO 20 MG Tabs tablet Generic drug:  rivaroxaban Take 20 mg by mouth daily with supper.            Discharge Care Instructions  (From admission, onward)        Start     Ordered   12/03/17 0000  Change dressing    Comments:  Maintain surgical dressing until follow up in the clinic. If the edges start to pull up, may reinforce with tape. If the dressing is no longer working, may remove and cover with gauze and tape, but must keep the area dry and clean.  Call with any questions or concerns.   12/03/17 2153       Signed: [place] Pugh. Einar Nolasco   PA-C  12/08/2017, 9:31 AM

## 2017-12-10 DIAGNOSIS — N183 Chronic kidney disease, stage 3 (moderate): Secondary | ICD-10-CM | POA: Diagnosis not present

## 2017-12-10 DIAGNOSIS — I130 Hypertensive heart and chronic kidney disease with heart failure and stage 1 through stage 4 chronic kidney disease, or unspecified chronic kidney disease: Secondary | ICD-10-CM | POA: Diagnosis not present

## 2017-12-10 DIAGNOSIS — T8489XD Other specified complication of internal orthopedic prosthetic devices, implants and grafts, subsequent encounter: Secondary | ICD-10-CM | POA: Diagnosis not present

## 2017-12-10 DIAGNOSIS — I5032 Chronic diastolic (congestive) heart failure: Secondary | ICD-10-CM | POA: Diagnosis not present

## 2017-12-10 DIAGNOSIS — I739 Peripheral vascular disease, unspecified: Secondary | ICD-10-CM | POA: Diagnosis not present

## 2017-12-10 DIAGNOSIS — I2510 Atherosclerotic heart disease of native coronary artery without angina pectoris: Secondary | ICD-10-CM | POA: Diagnosis not present

## 2017-12-14 DIAGNOSIS — I2510 Atherosclerotic heart disease of native coronary artery without angina pectoris: Secondary | ICD-10-CM | POA: Diagnosis not present

## 2017-12-14 DIAGNOSIS — I130 Hypertensive heart and chronic kidney disease with heart failure and stage 1 through stage 4 chronic kidney disease, or unspecified chronic kidney disease: Secondary | ICD-10-CM | POA: Diagnosis not present

## 2017-12-14 DIAGNOSIS — N183 Chronic kidney disease, stage 3 (moderate): Secondary | ICD-10-CM | POA: Diagnosis not present

## 2017-12-14 DIAGNOSIS — I739 Peripheral vascular disease, unspecified: Secondary | ICD-10-CM | POA: Diagnosis not present

## 2017-12-14 DIAGNOSIS — I5032 Chronic diastolic (congestive) heart failure: Secondary | ICD-10-CM | POA: Diagnosis not present

## 2017-12-14 DIAGNOSIS — T8489XD Other specified complication of internal orthopedic prosthetic devices, implants and grafts, subsequent encounter: Secondary | ICD-10-CM | POA: Diagnosis not present

## 2017-12-18 DIAGNOSIS — I2510 Atherosclerotic heart disease of native coronary artery without angina pectoris: Secondary | ICD-10-CM | POA: Diagnosis not present

## 2017-12-18 DIAGNOSIS — I130 Hypertensive heart and chronic kidney disease with heart failure and stage 1 through stage 4 chronic kidney disease, or unspecified chronic kidney disease: Secondary | ICD-10-CM | POA: Diagnosis not present

## 2017-12-18 DIAGNOSIS — N183 Chronic kidney disease, stage 3 (moderate): Secondary | ICD-10-CM | POA: Diagnosis not present

## 2017-12-18 DIAGNOSIS — I739 Peripheral vascular disease, unspecified: Secondary | ICD-10-CM | POA: Diagnosis not present

## 2017-12-18 DIAGNOSIS — I5032 Chronic diastolic (congestive) heart failure: Secondary | ICD-10-CM | POA: Diagnosis not present

## 2017-12-18 DIAGNOSIS — T8489XD Other specified complication of internal orthopedic prosthetic devices, implants and grafts, subsequent encounter: Secondary | ICD-10-CM | POA: Diagnosis not present

## 2017-12-21 ENCOUNTER — Ambulatory Visit (INDEPENDENT_AMBULATORY_CARE_PROVIDER_SITE_OTHER): Payer: Medicare Other

## 2017-12-21 DIAGNOSIS — J4550 Severe persistent asthma, uncomplicated: Secondary | ICD-10-CM

## 2017-12-21 MED ORDER — DUPILUMAB 300 MG/2ML ~~LOC~~ SOSY
300.0000 mg | PREFILLED_SYRINGE | Freq: Once | SUBCUTANEOUS | Status: AC
  Administered 2017-12-21: 300 mg via SUBCUTANEOUS

## 2017-12-31 ENCOUNTER — Telehealth: Payer: Self-pay | Admitting: Internal Medicine

## 2017-12-31 NOTE — Telephone Encounter (Signed)
1 prefilled syringe Arrival Date: 12/31/17 Lot #[id number] Exp date: 08/2018

## 2018-01-04 ENCOUNTER — Ambulatory Visit (INDEPENDENT_AMBULATORY_CARE_PROVIDER_SITE_OTHER): Payer: Medicare Other

## 2018-01-04 DIAGNOSIS — J4550 Severe persistent asthma, uncomplicated: Secondary | ICD-10-CM

## 2018-01-04 MED ORDER — DUPILUMAB 300 MG/2ML ~~LOC~~ SOSY
300.0000 mg | PREFILLED_SYRINGE | Freq: Once | SUBCUTANEOUS | Status: AC
  Administered 2018-01-04: 300 mg via SUBCUTANEOUS

## 2018-01-04 NOTE — Progress Notes (Signed)
Documentation of medication administration and charges of Dupxient have been completed by Lindsay Lemons, CMA based on the hand written Dupxient documentation sheet completed by Tammy Scott, who administered the medication.  

## 2018-01-18 ENCOUNTER — Ambulatory Visit (INDEPENDENT_AMBULATORY_CARE_PROVIDER_SITE_OTHER): Payer: Medicare Other

## 2018-01-18 DIAGNOSIS — J4550 Severe persistent asthma, uncomplicated: Secondary | ICD-10-CM | POA: Diagnosis not present

## 2018-01-20 DIAGNOSIS — M1712 Unilateral primary osteoarthritis, left knee: Secondary | ICD-10-CM | POA: Diagnosis not present

## 2018-01-20 DIAGNOSIS — M25562 Pain in left knee: Secondary | ICD-10-CM | POA: Diagnosis not present

## 2018-01-20 DIAGNOSIS — M1612 Unilateral primary osteoarthritis, left hip: Secondary | ICD-10-CM | POA: Diagnosis not present

## 2018-01-20 DIAGNOSIS — Z4732 Aftercare following explantation of hip joint prosthesis: Secondary | ICD-10-CM | POA: Diagnosis not present

## 2018-01-20 DIAGNOSIS — J4550 Severe persistent asthma, uncomplicated: Secondary | ICD-10-CM

## 2018-01-20 MED ORDER — DUPILUMAB 300 MG/2ML ~~LOC~~ SOSY
300.0000 mg | PREFILLED_SYRINGE | Freq: Once | SUBCUTANEOUS | Status: AC
  Administered 2018-01-20: 300 mg via SUBCUTANEOUS

## 2018-01-20 NOTE — Progress Notes (Signed)
Documentation of medication administration and charges of Dupxient have been completed by Desmond Dike, CMA based on the hand written Dupxient documentation sheet completed by Maury Dus, RMA, who administered the medication.

## 2018-01-22 DIAGNOSIS — M810 Age-related osteoporosis without current pathological fracture: Secondary | ICD-10-CM | POA: Diagnosis not present

## 2018-01-22 DIAGNOSIS — R82998 Other abnormal findings in urine: Secondary | ICD-10-CM | POA: Diagnosis not present

## 2018-01-22 DIAGNOSIS — R7301 Impaired fasting glucose: Secondary | ICD-10-CM | POA: Diagnosis not present

## 2018-01-22 DIAGNOSIS — E7849 Other hyperlipidemia: Secondary | ICD-10-CM | POA: Diagnosis not present

## 2018-01-22 DIAGNOSIS — E041 Nontoxic single thyroid nodule: Secondary | ICD-10-CM | POA: Diagnosis not present

## 2018-01-22 DIAGNOSIS — I10 Essential (primary) hypertension: Secondary | ICD-10-CM | POA: Diagnosis not present

## 2018-01-29 ENCOUNTER — Other Ambulatory Visit: Payer: Self-pay | Admitting: Internal Medicine

## 2018-01-29 ENCOUNTER — Telehealth: Payer: Self-pay | Admitting: Internal Medicine

## 2018-01-29 DIAGNOSIS — J45901 Unspecified asthma with (acute) exacerbation: Secondary | ICD-10-CM | POA: Diagnosis not present

## 2018-01-29 DIAGNOSIS — Z6826 Body mass index (BMI) 26.0-26.9, adult: Secondary | ICD-10-CM | POA: Diagnosis not present

## 2018-01-29 DIAGNOSIS — I8310 Varicose veins of unspecified lower extremity with inflammation: Secondary | ICD-10-CM | POA: Diagnosis not present

## 2018-01-29 DIAGNOSIS — Z1389 Encounter for screening for other disorder: Secondary | ICD-10-CM | POA: Diagnosis not present

## 2018-01-29 DIAGNOSIS — I2510 Atherosclerotic heart disease of native coronary artery without angina pectoris: Secondary | ICD-10-CM | POA: Diagnosis not present

## 2018-01-29 DIAGNOSIS — I82409 Acute embolism and thrombosis of unspecified deep veins of unspecified lower extremity: Secondary | ICD-10-CM | POA: Diagnosis not present

## 2018-01-29 DIAGNOSIS — Z0000 Encounter for general adult medical examination without abnormal findings: Secondary | ICD-10-CM | POA: Diagnosis not present

## 2018-01-29 DIAGNOSIS — E041 Nontoxic single thyroid nodule: Secondary | ICD-10-CM | POA: Diagnosis not present

## 2018-01-29 DIAGNOSIS — N183 Chronic kidney disease, stage 3 (moderate): Secondary | ICD-10-CM | POA: Diagnosis not present

## 2018-01-29 DIAGNOSIS — M810 Age-related osteoporosis without current pathological fracture: Secondary | ICD-10-CM | POA: Diagnosis not present

## 2018-01-29 DIAGNOSIS — M25552 Pain in left hip: Secondary | ICD-10-CM | POA: Diagnosis not present

## 2018-01-29 DIAGNOSIS — I700 Atherosclerosis of aorta: Secondary | ICD-10-CM | POA: Diagnosis not present

## 2018-01-29 NOTE — Telephone Encounter (Signed)
1 prefilled syringe Arrival Date: 01/29/18 Lot #:[id number] Exp date: 06/2019

## 2018-02-01 ENCOUNTER — Ambulatory Visit (INDEPENDENT_AMBULATORY_CARE_PROVIDER_SITE_OTHER): Payer: Medicare Other

## 2018-02-01 DIAGNOSIS — D721 Eosinophilia, unspecified: Secondary | ICD-10-CM

## 2018-02-01 MED ORDER — DUPILUMAB 300 MG/2ML ~~LOC~~ SOSY
300.0000 mg | PREFILLED_SYRINGE | Freq: Once | SUBCUTANEOUS | Status: AC
  Administered 2018-02-01: 300 mg via SUBCUTANEOUS

## 2018-02-15 ENCOUNTER — Ambulatory Visit (INDEPENDENT_AMBULATORY_CARE_PROVIDER_SITE_OTHER): Payer: Medicare Other

## 2018-02-15 DIAGNOSIS — J4550 Severe persistent asthma, uncomplicated: Secondary | ICD-10-CM | POA: Diagnosis not present

## 2018-02-15 MED ORDER — DUPILUMAB 300 MG/2ML ~~LOC~~ SOSY
300.0000 mg | PREFILLED_SYRINGE | Freq: Once | SUBCUTANEOUS | Status: AC
  Administered 2018-02-15: 300 mg via SUBCUTANEOUS

## 2018-02-15 NOTE — Progress Notes (Signed)
Documentation of medication administration and charges of Dupxient have been completed by Loriann Bosserman, CMA based on the hand written Dupxient documentation sheet completed by Tammy Scott, who administered the medication.  

## 2018-02-24 ENCOUNTER — Telehealth: Payer: Self-pay | Admitting: Internal Medicine

## 2018-02-24 NOTE — Telephone Encounter (Addendum)
1 prefilled syringe Arrival Date: 02/24/18 Lot #:[id number] Exp date: 01/2020 Pt orders Dupixent.

## 2018-03-01 ENCOUNTER — Ambulatory Visit (INDEPENDENT_AMBULATORY_CARE_PROVIDER_SITE_OTHER): Payer: Medicare Other

## 2018-03-01 DIAGNOSIS — J4550 Severe persistent asthma, uncomplicated: Secondary | ICD-10-CM | POA: Diagnosis not present

## 2018-03-01 MED ORDER — DUPILUMAB 300 MG/2ML ~~LOC~~ SOSY
300.0000 mg | PREFILLED_SYRINGE | Freq: Once | SUBCUTANEOUS | Status: AC
  Administered 2018-03-01: 300 mg via SUBCUTANEOUS

## 2018-03-01 NOTE — Progress Notes (Signed)
Documented by Jessica Jones CMA based on hand-written Dupixent documentation sheet completed by Tammy Scott CMA, who administered the medication.  

## 2018-03-08 ENCOUNTER — Encounter: Payer: Self-pay | Admitting: Internal Medicine

## 2018-03-08 ENCOUNTER — Ambulatory Visit (INDEPENDENT_AMBULATORY_CARE_PROVIDER_SITE_OTHER): Payer: Medicare Other | Admitting: Internal Medicine

## 2018-03-08 VITALS — BP 130/82 | HR 73 | Ht 64.0 in | Wt 155.0 lb

## 2018-03-08 DIAGNOSIS — J4540 Moderate persistent asthma, uncomplicated: Secondary | ICD-10-CM | POA: Diagnosis not present

## 2018-03-08 DIAGNOSIS — I2510 Atherosclerotic heart disease of native coronary artery without angina pectoris: Secondary | ICD-10-CM | POA: Diagnosis not present

## 2018-03-08 DIAGNOSIS — J324 Chronic pansinusitis: Secondary | ICD-10-CM

## 2018-03-08 LAB — NITRIC OXIDE: Nitric Oxide: 38

## 2018-03-08 MED ORDER — PREDNISONE 10 MG PO TABS: ORAL_TABLET | ORAL | 0 refills | Status: DC

## 2018-03-08 NOTE — Patient Instructions (Addendum)
ICD-10-CM   1. Pansinusitis, unspecified chronicity J32.4   2. Moderate persistent extrinsic asthma without complication M42.68      Your sinuses are acting up because of weather change Asthma itself appears under control  Plan - Take prednisone 40 mg daily x 2 days, then 20mg  daily x 2 days, then 10mg  daily x 2 days, then 5mg  daily x 2 days and stop -Continue saline nasal irrigation -Restart nasal steroid inhaler -If problems persist we will refer you to ENT (March 2018 CT scan of the sinus showed congestion all around] -Respect deferral of flu shot -Continue asthma medications including injection dupilumab as before  Follow-up 4 to 6 months or sooner if needed

## 2018-03-08 NOTE — Progress Notes (Signed)
PCP Crist Infante, MD   HPI   [facility].:  Follow-up for Severe, Persistent Asthma, Chronic Allergic Rhinitis, DVT, & GERD w/ Hiatal Hernia.  HPI Patient has been seen twice in our office since her last appointment with me. Last visit was on 9/26 with nurse practitioner. Patient was treated for an exacerbation of her asthma with bronchitis. She was given a prednisone taper, instructed to use Mucinex twice daily as needed, and a 7 day course of Augmentin. Since her last appointment she fractured her left hip. She is continuing to walk with a cane. She reports no adverse effects from her Xolair. She does feel the Xolair is helping her significantly. Hasn't needed her rescue inhaler significantly in the last few weeks. Only rare nocturnal awakenings with any dyspnea.   Severe, persistent asthma: Patient currently maintained on a regimen of Xolair, Singulair, Spiriva, and Symbicort. She feels her dyspnea has significantly improved. She still has a scratchy throat & hoarse voice. She has her baseline nonproductive cough. No wheezing.  Chronic allergic rhinitis: Previously referred to ENT. Regimen at last appointment with me included Allegra, Singulair, Nasonex, and Xolair. Patient has had multiple different antibiotic courses for recurrent sinusitis. Only having intermittent sinus congestion & drainage.   DVT: Present in bilateral lower extremities. Patient on systemic anticoagulation with Xarelto indefinitely. No melena or hematochezia. No hematuria.   GERD with hiatal hernia: Previously prescribed Nexium. No reflux or dyspepsia. No morning brash water taste.   Review of Systems  No chest pain or tightness. No fever or chills. No abdominal pain or nausea.    OV 06/15/2017  Chief Complaint  Patient presents with  . Follow-up    flare ups with asthma when the weather changes.  Right now it is a cough, with congestions and wheezing. Non-productive dry cough. SOB with any activity.  Hoarseness.    84 year old female.  Transfer of care from Dr. Ashok Cordia who is left the practice.  Moderate persistent asthma with an elevated IgE.  She is here with her husband.  She tells me that in 2015-2017 she was started on Xolair and then subsequently switched in [place] last year or year before last.  This then caused nonspecific side effect such as erythema in her face and nonspecific pain that reminded her of shingles.  Therefore she discontinued this and the side effects resolved.  She did have 6 doses of the interleukin-5 receptor antibody.  She went back on Xolair.  Overall she feels that since starting Xolair her quality of life and asthma exacerbations have improved but nevertheless she still gets asthma exacerbations every few months and has to go on prednisone.  Currently she states that for the last few to several days she has had increased cough although no change in sputum.  She is also wheezing a little bit more than usual.  Asthma control questionnaire shows for the last week she is waking up a few times at night.  When she wakes up she has moderate symptoms.  Her activities are slightly limited because of asthma.  She is moderate amount of shortness of breath because of asthma and she is wheezing a little of the time and using albuterol for rescue at least 1 or 2 times daily.  Exam nitric oxide is significantly elevated at 125 ppb.  She think she will benefit from prednisone  She she reports to me for the first time a new issue of left anterior cervical lymph node/submandibular lymph node.  She says it  is been present for a while but other physicians were examined it have not felt it.  She asked me to examine it.  She feels it is slowly growing.  Insidious onset for a year.   OV 08/13/2017  Chief Complaint  Patient presents with  . Follow-up    Pt states she has had many flare-ups with her asthma. States she has had a lot of sinus drainage and congestion and a lot of chest tightness.    84 year old  female with complicated asthma   poor control of asthma continues.  At my last visit she did a prednisone taper.  Then a few weeks ago her sinuses acted up and therefore her asthma acted up and she did another prednisone taper on herself at home but it was just for a few days.  She continues on Spiriva, Symbicort, Singulair, Nexium and Xolair injections.  She feels that ever since Dr. Asencion Noble retired her asthma has been out of control.  Currently for the last few weeks her sinuses have been acting up and she wants antibiotics.  In particular she wants Augmentin.  She says in the past this was deemed as an allergy but she went and saw some doctor in [place] and it looks like she has been desensitized.  Penicillin is no longer listed as an allergy.  She insists that she wants Augmentin.  Review of the chart shows she has had chronic sinusitis and a CT scan of the sinus 1 year ago in February 2018.  She says she has seen ENT for this.  No surgery has been recommended.  Just nasal hygiene and antibiotic courses.  Currently her symptoms are quite significant with asthma control question of greater than 2 it appears this might all just be her baseline.  Her exam nitric oxide is still high over 100.  At night she is waking up several times.  When she wakes up she has moderate symptoms.  Activities are slightly limited because of asthma.  She has moderate amount of shortness of breath because of asthma.  She is wheezing hardly and she uses albuterol for rescue at least 1 or 2 times daily.   OV  Chief Complaint  Patient presents with  . Follow-up    Pt states her breathing has been much better since she began new injection but states she is coughing more than before. Denies any CP.    Follow-up severe persistent asthma with eosinophilia: In April 2019 we started dupulimab and stop the Xolair. With this asthma control is significantly better. Her asthma control questionnaire average score has drop to  0.6. Her exhaled nitric oxide is drop from 100s to 43. She says she is much less short of breath and is able to do a lot more things. She is very pleased with the new injectable. However she feels that her baseline chronic cough is slightly worse. It is moderate in intensity is dry. It does not wake up at night. It is not associated with wheezing or shortness of breath. It is associated with Spiriva MDI. She does clear her throat. Coughing is made worse by talking. Off note, she has new issue of left hip surgery coming up in 2 days. She feels from a pulmonary standpoint she will be stable to do the surgery because of prior experience with surgery.        OV 03/08/2018  Subjective:  Patient ID: [name], female , DOB: August 05, 1932 , age 20 y.o. , MRN: [id number] ,  ADDRESS: [address] [place] [address]   03/08/2018 -   Chief Complaint  Patient presents with  . Follow-up    Sinus draniage and more cough      HPI [name] 84 y.o. -returns for asthma follow-up.  However the issue today is that she reports worsening of sinus drainage for the last 1 month ever since the fall weather change.  He says the sinus drainage is so significant that it is making her cough.  She feels asthma itself is under control.  In fact exam nitric oxide today is 38.  She is continuing inhalers Singulair Nexium and dupilumab.  She refused flu shot because she is allergic to it.  Review of the chart indicates March 2018 Dr. Ashok Cordia did a CT scan of the sinus and it showed pansinusitis.  She is says she is seen ENT from 2 years ago and is unclear if it was before the scan or after the scan.  At this point in time she just wants relief from her current sinus issue.  The sinus drainage is possibly clear but she has not looked at it.  There is no fever worsening wheeze or chest tightness.   Asthma Control Panel  10/11/15 - IgE - 425 , rest of blood allergy panel negative. Blood eos 300cells (al;so 300 on  12/19/16). CT Sinus feb 2018 - chronic pan sinusitis     07/02/2016 06/15/2017  08/13/2017  12/01/2017  03/08/2018     Current Med Regimen  Spriva, symbicort,  Singulair, xolair, nexium same Spiriva, Symbicort, Singulair, Nexium and dupulimab   ACQ 5 point- 1 week. wtd avg score. <1.0 is good control 0.75-1.25 is grey zone. >1.25 poor control. Delta 0.5 is clinically meaningful   2.4 0.6   FeNO ppB  126 ppb 104 ppb 43 38 ppb  FeV1  1.777/90%, ratio 74,       Planned intervention  for visit   HRCT, talk to eye doc and if ok start dupixent, augmenting, prednsione,       ROS - per HPI     has a past medical history of Asthma, severe persistent, Chronic kidney disease, stage II (mild), DDD (degenerative disc disease), cervical, Diverticulosis (yrs ago), Edema, lower extremity, GERD (gastroesophageal reflux disease), Heart murmur, History of breast cancer (1990 left mastectomy also), History of DVT of lower extremity (5 yrs ago), History of shingles, Hyperlipidemia, Leg ulcer, left ([place]), Osteoporosis, unspecified, Perennial allergic rhinitis, Restless legs syndrome (RLS), Rheumatoid arthritis ([place]), Thyroid nodule, and Unspecified essential hypertension.   reports that she has never smoked. She has never used smokeless tobacco.  Past Surgical History:  Procedure Laterality Date  . CARDIAC CATHETERIZATION  08/ 01/ 2008   dr Cathie Olden   normal -- EF of 65%  . CARDIOVASCULAR STRESS TEST  05-22-2011   dr Cathie Olden   normal lexiscan perfusion study/  no ischemia/  lvsf  86%  . CATARACT EXTRACTION W/ INTRAOCULAR LENS  IMPLANT, BILATERAL    . CHOLECYSTECTOMY  1993   laparoscopic  . CONVERSION TO TOTAL HIP Left 12/03/2017   Procedure: CONVERSION TO LEFT TOTAL HIP;  Surgeon: Paralee Cancel, MD;  Location: [facility];  Service: Orthopedics;  Laterality: Left;  90 mins  . HIP ARTHROPLASTY Left 12/22/2016   Procedure: HEMIARTHROPLASTY, LEFT;  Surgeon: Paralee Cancel, MD;  Location: [facility];  Service: Orthopedics;   Laterality: Left;  . INCISION AND DRAINAGE OF WOUND Left 10/24/2013   Procedure: IRRIGATION AND DEBRIDEMENT OF LEFT LEG WITH PLACEMENT  OF ACELL AND WOUND VAC;  Surgeon: Theodoro Kos, DO;  Location: [facility];  Service: Plastics;  Laterality: Left;  . [place]  . MASTECTOMY Bilateral rigth 1989///   left  1990   breast cancer 1989//   fibrocytic disease 1990  . TOTAL ABDOMINAL HYSTERECTOMY W/ BILATERAL SALPINGOOPHORECTOMY  1989   done at same time as right mastectomy  . TOTAL KNEE ARTHROPLASTY Right 10/05/2012   Procedure: RIGHT TOTAL KNEE ARTHROPLASTY;  Surgeon: Mauri Pole, MD;  Location: [facility];  Service: Orthopedics;  Laterality: Right;    Allergies  Allergen Reactions  . Gabapentin Nausea Only  . Eggs Or Egg-Derived Products     RAW EGGS.. Can eat cooked eggs  . Influenza Vaccines     Egg allergy   . Nucala [Mepolizumab]   . Cephalosporins Rash  . Codeine Rash  . Levofloxacin Rash  . Pneumococcal Vaccines Rash    Immunization History  Administered Date(s) Administered  . Pneumococcal Conjugate-13 09/14/2012  . Pneumococcal Polysaccharide-23 01/31/2009  . Zoster Recombinat (Shingrix) 04/18/2017, 09/16/2017    Family History  Problem Relation Age of Onset  . Heart attack Mother   . Asthma Mother   . Heart disease Mother   . Hyperlipidemia Mother   . Heart attack Father   . Heart failure Father   . Deep vein thrombosis Father   . Heart disease Father   . Peripheral vascular disease Father        amputation  . Cancer Brother   . Asthma Brother   . Coronary artery disease Sister   . Heart disease Sister      Current Outpatient Medications:  .  acetaminophen (TYLENOL) 500 MG tablet, Take 2 tablets (1,000 mg total) by mouth every 8 (eight) hours., Disp: 30 tablet, Rfl: 0 .  albuterol (PROAIR HFA) 108 (90 Base) MCG/ACT inhaler, Inhale 2 puffs into the lungs every 6 (six) hours as needed for wheezing or shortness of  breath., Disp: 18 g, Rfl: 5 .  albuterol (PROVENTIL) (2.5 MG/3ML) 0.083% nebulizer solution, Take 3 mLs (2.5 mg total) by nebulization every 6 (six) hours as needed for wheezing or shortness of breath. Dx: J45.41, Disp: 360 mL, Rfl: 0 .  benzonatate (TESSALON) 100 MG capsule, Take 1 capsule (100 mg total) by mouth 3 (three) times daily as needed for cough., Disp: 100 capsule, Rfl: 1 .  budesonide-formoterol (SYMBICORT) 160-4.5 MCG/ACT inhaler, Inhale 2 puffs into the lungs 2 (two) times daily., Disp: 30.6 Inhaler, Rfl: 1 .  Calcium Carbonate-Vitamin D (CALTRATE 600+D) 600-400 MG-UNIT per tablet, Take 1 tablet by mouth daily. , Disp: , Rfl:  .  chlorpheniramine (CHLOR-TRIMETON) 4 MG tablet, Take by mouth. 4 mg in the morning and 8 mg at bedtime, Disp: , Rfl:  .  Cholecalciferol (VITAMIN D3) 1000 UNITS tablet, Take 1,000 Units by mouth every evening. , Disp: , Rfl:  .  dextromethorphan (DELSYM) 30 MG/5ML liquid, Take 60 mg by mouth 2 (two) times daily as needed for cough., Disp: , Rfl:  .  dextromethorphan-guaiFENesin (MUCINEX DM) 30-600 MG 12hr tablet, Take 1 tablet by mouth daily as needed for cough., Disp: , Rfl:  .  docusate sodium (COLACE) 100 MG capsule, Take 1 capsule (100 mg total) by mouth 2 (two) times daily., Disp: 10 capsule, Rfl: 0 .  Dupilumab (DUPIXENT) 300 MG/2ML SOSY, Inject 300 mg into the skin every 14 (fourteen) days., Disp: 2 Syringe, Rfl: 6 .  esomeprazole (  NEXIUM) 40 MG capsule, Take 1 capsule (40 mg total) by mouth 2 (two) times daily. Name brand only, Disp: 180 capsule, Rfl: 3 .  ferrous sulfate (FERROUSUL) 325 (65 FE) MG tablet, Take 1 tablet (325 mg total) by mouth 3 (three) times daily with meals., Disp: , Rfl: 3 .  fexofenadine (ALLEGRA) 180 MG tablet, Take 180 mg by mouth daily.  , Disp: , Rfl:  .  furosemide (LASIX) 20 MG tablet, Take 1 tablet (20 mg total) daily as needed by mouth for fluid or edema. (Patient taking differently: Take 20 mg by mouth daily. ), Disp: 90  tablet, Rfl: 3 .  KLOR-CON 10 10 MEQ tablet, TAKE 1 TABLET BY MOUTH EVERY DAY (Patient taking differently: TAKE 1 TABLET BY MOUTH EVERY DAY in the morning), Disp: 90 tablet, Rfl: 2 .  mometasone (NASONEX) 50 MCG/ACT nasal spray, Place 2 sprays into the nose daily., Disp: 51 g, Rfl: 11 .  montelukast (SINGULAIR) 10 MG tablet, TAKE 1 TABLET BY MOUTH EVERYDAY AT BEDTIME, Disp: 90 tablet, Rfl: 1 .  Multiple Vitamin (MULTIVITAMIN) tablet, Take 1 tablet by mouth daily., Disp: , Rfl:  .  pramipexole (MIRAPEX) 0.125 MG tablet, Take 0.375 mg by mouth at bedtime. , Disp: , Rfl:  .  rosuvastatin (CRESTOR) 5 MG tablet, TAKE 1 TABLET BY MOUTH EVERYDAY AT BEDTIME, Disp: 90 tablet, Rfl: 3 .  Spacer/Aero-Holding Chambers (AEROCHAMBER MV) inhaler, by Other route. Use as instructed , Disp: , Rfl:  .  telmisartan (MICARDIS) 20 MG tablet, TAKE 1 TABLET BY MOUTH AT BEDTIME, Disp: 90 tablet, Rfl: 3 .  Tiotropium Bromide Monohydrate (SPIRIVA RESPIMAT) 2.5 MCG/ACT AERS, Inhale 2 puffs into the lungs daily., Disp: 3 Inhaler, Rfl: 3 .  traMADol (ULTRAM) 50 MG tablet, Take 1 tablet (50 mg total) by mouth every 6 (six) hours as needed., Disp: 20 tablet, Rfl: 0 .  vitamin B-12 (CYANOCOBALAMIN) 500 MCG tablet, Take 500 mcg by mouth daily.  , Disp: , Rfl:  .  XARELTO 20 MG TABS tablet, Take 20 mg by mouth daily with supper. , Disp: , Rfl:   Current Facility-Administered Medications:  .  omalizumab (XOLAIR) injection 300 mg, 300 mg, Subcutaneous, Q14 Days, Dicy Smigel, MD, 300 mg at 07/27/17 1013 .  omalizumab Arvid Right) injection 300 mg, 300 mg, Subcutaneous, Q14 Days, Kinnedy Mongiello, MD, 300 mg at 08/13/17 0815 .  omalizumab Arvid Right) injection 300 mg, 300 mg, Subcutaneous, Q14 Days, Anona Giovannini, Belva Crome, MD, 300 mg at 08/27/17 0950      Objective:   Vitals:   03/08/18 0906  BP: 130/82  Pulse: 73  SpO2: 99%  Weight: 155 lb (70.3 kg)  Height: 5\' 4"  (1.626 m)    Estimated body mass index is 26.61 kg/m as  calculated from the following:   Height as of this encounter: 5\' 4"  (1.626 m).   Weight as of this encounter: 155 lb (70.3 kg).  @WEIGHTCHANGE @  [employer]   03/08/18 0906  Weight: 155 lb (70.3 kg)     Physical Exam  General Appearance:    Alert, cooperative, no distress, appears stated age - yes , Deconditioned looking - no , OBESE  - no, Sitting on Wheelchair -  no  Head:    Normocephalic, without obvious abnormality, atraumatic  Eyes:    PERRL, conjunctiva/corneas clear,  Ears:    Normal TM's and external ear canals, both ears  Nose:   Nares normal, septum midline, mucosa normal, no drainage    or sinus tenderness. OXYGEN  ON  - no . Patient is @ ra   Throat:   Lips, mucosa, and tongue normal; teeth and gums normal. Cyanosis on lips - no  Neck:   Supple, symmetrical, trachea midline, no adenopathy;    thyroid:  no enlargement/tenderness/nodules; no carotid   bruit or JVD  Back:     Symmetric, no curvature, ROM normal, no CVA tenderness  Lungs:     Distress - no , Wheeze no, Barrell Chest - no, Purse lip breathing - no, Crackles - no   Chest Wall:    No tenderness or deformity.    Heart:    Regular rate and rhythm, S1 and S2 normal, no rub   or gallop, Murmur - no  Breast Exam:    NOT DONE  Abdomen:     Soft, non-tender, bowel sounds active all four quadrants,    no masses, no organomegaly. Visceral obesity - no  Genitalia:   NOT DONE  Rectal:   NOT DONE  Extremities:   Extremities - normal, Has Cane - no, Clubbing - no, Edema - no  Pulses:   2+ and symmetric all extremities  Skin:   Stigmata of Connective Tissue Disease - no  Lymph nodes:   Cervical, supraclavicular, and axillary nodes normal  Psychiatric:  Neurologic:   Pleasant - yes, Anxious - no, Flat affect - no  CAm-ICU - neg, Alert and Oriented x 3 - yes, Moves all 4s - yes, Speech - normal, Cognition - intact           Assessment:       ICD-10-CM   1. Pansinusitis, unspecified chronicity J32.4   2.  Moderate persistent extrinsic asthma without complication O70.78        Plan:     Patient Instructions     ICD-10-CM   1. Pansinusitis, unspecified chronicity J32.4   2. Moderate persistent extrinsic asthma without complication M75.44      Your sinuses are acting up because of weather change Asthma itself appears under control  Plan - Take prednisone 40 mg daily x 2 days, then 20mg  daily x 2 days, then 10mg  daily x 2 days, then 5mg  daily x 2 days and stop -Continue saline nasal irrigation -Restart nasal steroid inhaler -If problems persist we will refer you to ENT (March 2018 CT scan of the sinus showed congestion all around] -Respect deferral of flu shot -Continue asthma medications including injection dupilumab as before  Follow-up 4 to 6 months or sooner if needed      SIGNATURE    Dr. Brand Males, M.D., F.C.C.P,  Pulmonary and Critical Care Medicine Staff Physician, [place] Director - Interstitial Lung Disease  Program  [facility] [place], [place], [address]  Pager: [phone], If no answer or between  15:00h - 7:00h: call [phone] Telephone: [phone]  9:24 AM 03/08/2018

## 2018-03-08 NOTE — Addendum Note (Signed)
Addended by: Shirlee More R on: 03/08/2018 11:15 AM   Modules accepted: Orders

## 2018-03-15 ENCOUNTER — Ambulatory Visit (INDEPENDENT_AMBULATORY_CARE_PROVIDER_SITE_OTHER): Payer: Medicare Other

## 2018-03-15 DIAGNOSIS — J4550 Severe persistent asthma, uncomplicated: Secondary | ICD-10-CM

## 2018-03-15 MED ORDER — DUPILUMAB 300 MG/2ML ~~LOC~~ SOSY
300.0000 mg | PREFILLED_SYRINGE | Freq: Once | SUBCUTANEOUS | Status: AC
  Administered 2018-03-15: 300 mg via SUBCUTANEOUS

## 2018-03-15 NOTE — Progress Notes (Signed)
Documentation of medication administration and charges of Dupxient have been completed by  , CMA based on the hand written Dupxient documentation sheet completed by Tammy Scott, who administered the medication.  

## 2018-03-18 ENCOUNTER — Telehealth: Payer: Self-pay | Admitting: Internal Medicine

## 2018-03-18 NOTE — Telephone Encounter (Signed)
1 prefilled syringe Ordered Date: 03/17/18 Pt ordered 03/17/18, [facility] Faxed [place] a notification of delivery. Shipping Date: 03/17/18. It may come in today and it might be tomorrow. The fax didn't give a del. Date.

## 2018-03-19 ENCOUNTER — Ambulatory Visit (INDEPENDENT_AMBULATORY_CARE_PROVIDER_SITE_OTHER): Payer: Medicare Other | Admitting: Cardiovascular Disease

## 2018-03-19 ENCOUNTER — Encounter: Payer: Self-pay | Admitting: Cardiovascular Disease

## 2018-03-19 VITALS — BP 132/64 | HR 80 | Ht 64.0 in | Wt 158.0 lb

## 2018-03-19 DIAGNOSIS — I5032 Chronic diastolic (congestive) heart failure: Secondary | ICD-10-CM

## 2018-03-19 DIAGNOSIS — R0789 Other chest pain: Secondary | ICD-10-CM | POA: Diagnosis not present

## 2018-03-19 DIAGNOSIS — I2510 Atherosclerotic heart disease of native coronary artery without angina pectoris: Secondary | ICD-10-CM | POA: Diagnosis not present

## 2018-03-19 NOTE — Progress Notes (Signed)
[name] Date of Birth  1932/05/18       [facility]    [employer] [address], [address], [place], [place]  [address]   [place], [place]  [address] [phone]     [phone]   Fax  [phone]    Fax [phone]  Problem List: 1. History of chest pain-normal coronary arteries by heart catheterization in August, 2008 2. History of breast cancer-status post right mastectomy and 1989-status post left mastectomy in 1990 3. Hypertension 4. Hyperlipidemia 5. Asthma 6. Chronic diastolic CHF      [name] has had lots of problems with her asthma this summer.  She's not had any cardiac complaints. She denies any chest pain. She's not been able to exercise much of the summer because her asthma problems.  Oct. 23, 2014:  She has had a total knee replacement since I saw her.   She is back exercising some - rehab for her knee.  Has lost a few lbs.   Oct. 27, 2015:  [name] is doing ok from a cardiac standpoint. She's had a wound on the left lower leg which she's been tending to.  She has been going to the wound Center.   [name] history of asthma and has chronic shortness of breath. No angina like pain.    Oct. 27, 2016  Has had lots of breathing issues.  Has had a rough year with her lungs.  Recently had PFTs and was found to her 47% predicted capacity ( on one of the measured functions )  Wakes up with CP on occasion .   Also has some exertional CP .   Sep 12, 2015:  [name] is doing ok from a cardiac standpoint.  Had reported some  chest pain  myoview revealed normal left ventricular systolic function and no ischemia. The echocardiogram revealed normal left ventricle systolic function. She does have grade 1 diastolic dysfunction. She has mild tricuspid regurgitation.  She has not taken her amlodipine this week - due to leg swelling . She wants to keep it on her med list just in case her BP goes up .   She tried Nucala  injections for her asthma.   Did not tolerate it   Has lost 6 lbs   Nov. 2, 2017:  Doing well - she is followed for chronic diastolic congestive heart failure. Has issues with her asthma .  Active but no real exercise   Nov. 8, 2018:     [name] was admitted to the hospital on August 10 after falling and fracturing her left hip.  Nov. 8, 2019:  [name] is seen today for follow-up of her chronic diastolic congestive heart failure.  She has chronic asthma and chronic shortness of breath. Breathing is about the same .   Is on a new injection .  Leg swelling is under control , mild edema today  Had a hip replacement this point   Current Outpatient Medications on File Prior to Visit  Medication Sig Dispense Refill  . acetaminophen (TYLENOL) 500 MG tablet Take 2 tablets (1,000 mg total) by mouth every 8 (eight) hours. 30 tablet 0  . albuterol (PROAIR HFA) 108 (90 Base) MCG/ACT inhaler Inhale 2 puffs into the lungs every 6 (six) hours as needed for wheezing or shortness of breath. 18 g 5  . albuterol (PROVENTIL) (2.5 MG/3ML) 0.083% nebulizer solution Take 3 mLs (2.5 mg total) by nebulization every 6 (six) hours as needed  for wheezing or shortness of breath. Dx: J45.41 360 mL 0  . benzonatate (TESSALON) 100 MG capsule Take 1 capsule (100 mg total) by mouth 3 (three) times daily as needed for cough. 100 capsule 1  . budesonide-formoterol (SYMBICORT) 160-4.5 MCG/ACT inhaler Inhale 2 puffs into the lungs 2 (two) times daily. 30.6 Inhaler 1  . Calcium Carbonate-Vitamin D (CALTRATE 600+D) 600-400 MG-UNIT per tablet Take 1 tablet by mouth daily.     . chlorpheniramine (CHLOR-TRIMETON) 4 MG tablet Take by mouth. 4 mg in the morning and 8 mg at bedtime    . Cholecalciferol (VITAMIN D3) 1000 UNITS tablet Take 1,000 Units by mouth every evening.     [name] dextromethorphan (DELSYM) 30 MG/5ML liquid Take 60 mg by mouth 2 (two) times daily as needed for cough.    . dextromethorphan-guaiFENesin (MUCINEX DM)  30-600 MG 12hr tablet Take 1 tablet by mouth daily as needed for cough.    . docusate sodium (COLACE) 100 MG capsule Take 1 capsule (100 mg total) by mouth 2 (two) times daily. 10 capsule 0  . Dupilumab (DUPIXENT) 300 MG/2ML SOSY Inject 300 mg into the skin every 14 (fourteen) days. 2 Syringe 6  . esomeprazole (NEXIUM) 40 MG capsule Take 1 capsule (40 mg total) by mouth 2 (two) times daily. Name brand only 180 capsule 3  . fexofenadine (ALLEGRA) 180 MG tablet Take 180 mg by mouth daily.      . furosemide (LASIX) 20 MG tablet Take 20 mg by mouth daily.    [name] KLOR-CON 10 10 MEQ tablet TAKE 1 TABLET BY MOUTH EVERY DAY 90 tablet 2  . mometasone (NASONEX) 50 MCG/ACT nasal spray Place 2 sprays into the nose daily. 51 g 11  . montelukast (SINGULAIR) 10 MG tablet TAKE 1 TABLET BY MOUTH EVERYDAY AT BEDTIME 90 tablet 1  . Multiple Vitamin (MULTIVITAMIN) tablet Take 1 tablet by mouth daily.    . pramipexole (MIRAPEX) 0.125 MG tablet Take 0.375 mg by mouth at bedtime.     . rosuvastatin (CRESTOR) 5 MG tablet TAKE 1 TABLET BY MOUTH EVERYDAY AT BEDTIME 90 tablet 3  . Spacer/Aero-Holding Chambers (AEROCHAMBER MV) inhaler by Other route. Use as instructed     . telmisartan (MICARDIS) 20 MG tablet TAKE 1 TABLET BY MOUTH AT BEDTIME 90 tablet 3  . Tiotropium Bromide Monohydrate (SPIRIVA RESPIMAT) 2.5 MCG/ACT AERS Inhale 2 puffs into the lungs daily. 3 Inhaler 3  . traMADol (ULTRAM) 50 MG tablet Take 1 tablet (50 mg total) by mouth every 6 (six) hours as needed. 20 tablet 0  . vitamin B-12 (CYANOCOBALAMIN) 500 MCG tablet Take 500 mcg by mouth daily.      [name] Reasons 20 MG TABS tablet Take 20 mg by mouth daily with supper.      Current Facility-Administered Medications on File Prior to Visit  Medication Dose Route Frequency Provider Last Rate Last Dose  . omalizumab Arvid Right) injection 300 mg  300 mg Subcutaneous Q14 Days Brand Males, MD   300 mg at 07/27/17 1013  . omalizumab Arvid Right) injection 300 mg  300 mg  Subcutaneous Q14 Days Brand Males, MD   300 mg at 08/13/17 0815  . omalizumab Arvid Right) injection 300 mg  300 mg Subcutaneous Q14 Days Brand Males, MD   300 mg at 08/27/17 0950    Allergies  Allergen Reactions  . Gabapentin Nausea Only  . Eggs Or Egg-Derived Products     RAW EGGS.. Can eat cooked eggs  . Influenza Vaccines  Egg allergy   . Nucala [Mepolizumab]   . Cephalosporins Rash  . Codeine Rash  . Levofloxacin Rash  . Pneumococcal Vaccines Rash    Past Medical History:  Diagnosis Date  . Asthma, severe persistent    pulmologist-- dr Lynford Citizen  . Chronic kidney disease, stage II (mild)   . DDD (degenerative disc disease), cervical   . Diverticulosis yrs ago   hx of   . Edema, lower extremity    occ both legs swell  . GERD (gastroesophageal reflux disease)   . Heart murmur   . History of breast cancer 1990 left mastectomy also   1989  S/P   RIGHT MASTECTOMY ;  NO CHEMORADIATION //   NO RECURRENCE  . History of DVT of lower extremity 5 yrs ago   right leg  . History of shingles   . Hyperlipidemia   . Leg ulcer, left ([place])   . Osteoporosis, unspecified    Knee and hip osteoarthritis bilaterally  . Perennial allergic rhinitis   . Restless legs syndrome (RLS)   . Rheumatoid arthritis ([place])    hands  . Thyroid nodule    followed by dr perrini yearly, no current problam  . Unspecified essential hypertension     Past Surgical History:  Procedure Laterality Date  . CARDIAC CATHETERIZATION  08/ 01/ 2008   dr Cathie Olden   normal -- EF of 65%  . CARDIOVASCULAR STRESS TEST  05-22-2011   dr Cathie Olden   normal lexiscan perfusion study/  no ischemia/  lvsf  86%  . CATARACT EXTRACTION W/ INTRAOCULAR LENS  IMPLANT, BILATERAL    . CHOLECYSTECTOMY  1993   laparoscopic  . CONVERSION TO TOTAL HIP Left 12/03/2017   Procedure: CONVERSION TO LEFT TOTAL HIP;  Surgeon: Paralee Cancel, MD;  Location: [facility];  Service: Orthopedics;  Laterality: Left;  90 mins  . HIP  ARTHROPLASTY Left 12/22/2016   Procedure: HEMIARTHROPLASTY, LEFT;  Surgeon: Paralee Cancel, MD;  Location: [facility];  Service: Orthopedics;  Laterality: Left;  . INCISION AND DRAINAGE OF WOUND Left 10/24/2013   Procedure: IRRIGATION AND DEBRIDEMENT OF LEFT LEG WITH PLACEMENT OF ACELL AND WOUND VAC;  Surgeon: Theodoro Kos, DO;  Location: [facility];  Service: Plastics;  Laterality: Left;  . [place]  . MASTECTOMY Bilateral rigth 1989///   left  1990   breast cancer 1989//   fibrocytic disease 1990  . TOTAL ABDOMINAL HYSTERECTOMY W/ BILATERAL SALPINGOOPHORECTOMY  1989   done at same time as right mastectomy  . TOTAL KNEE ARTHROPLASTY Right 10/05/2012   Procedure: RIGHT TOTAL KNEE ARTHROPLASTY;  Surgeon: Mauri Pole, MD;  Location: [facility];  Service: Orthopedics;  Laterality: Right;    Social History   Tobacco Use  Smoking Status Never Smoker  Smokeless Tobacco Never Used    Social History   Substance and Sexual Activity  Alcohol Use No    Family History  Problem Relation Age of Onset  . Heart attack Mother   . Asthma Mother   . Heart disease Mother   . Hyperlipidemia Mother   . Heart attack Father   . Heart failure Father   . Deep vein thrombosis Father   . Heart disease Father   . Peripheral vascular disease Father        amputation  . Cancer Brother   . Asthma Brother   . Coronary artery disease Sister   . Heart disease Sister  Reviw of Systems:  Reviewed in the HPI.  All other systems are negative.  Physical Exam: Blood pressure 132/64, pulse 80, height 5\' 4"  (1.626 m), weight 158 lb (71.7 kg), SpO2 97 %.  GEN:   Elderly female, no wheezing  HEENT: Normal NECK: No JVD; No carotid bruits LYMPHATICS: No lymphadenopathy CARDIAC: RRR  RESPIRATORY:  Clear to auscultation without rales, wheezing or rhonchi  ABDOMEN: Soft, non-tender, non-distended MUSCULOSKELETAL:  1+ leg edema  SKIN: Warm and dry NEUROLOGIC:  Alert and  oriented x 3   ECG:    Assessment / Plan:  : 1. Chest discomfort:  No recent episodes of chest discomfort  No related to exercise  Heart cath looked good  Thinks is a GI issue   2. Chronic diastolic congestive heart failure:    Breathing is good   3. Dyspnea:        Mertie Moores, MD  03/19/2018 9:28 AM    [address],  [place], [place]  [address] Pager [phone] Phone: [phone]; Fax: [phone]

## 2018-03-19 NOTE — Patient Instructions (Signed)
Medication Instructions:  Your physician recommends that you continue on your current medications as directed. Please refer to the Current Medication list given to you today.  If you need a refill on your cardiac medications before your next appointment, please call your pharmacy.   Lab work: None ordered  If you have labs (blood work) drawn today and your tests are completely normal, you will receive your results only by: . MyChart Message (if you have MyChart) OR . A paper copy in the mail If you have any lab test that is abnormal or we need to change your treatment, we will call you to review the results.  Testing/Procedures: None ordered   Follow-Up: At [facility], you and your health needs are our priority.  As part of our continuing mission to provide you with exceptional heart care, we have created designated Provider Care Teams.  These Care Teams include your primary Cardiologist (physician) and Advanced Practice Providers (APPs -  Physician Assistants and Nurse Practitioners) who all work together to provide you with the care you need, when you need it. You will need a follow up appointment in:  12 months.  Please call our office 2 months in advance to schedule this appointment.  You may see Philip Nahser, MD or one of the following Advanced Practice Providers on your designated Care Team: Scott Weaver, PA-C Vin Bhagat, PA-C . Janine Hammond, NP  Any Other Special Instructions Will Be Listed Below (If Applicable).    

## 2018-03-25 NOTE — Telephone Encounter (Signed)
1 prefilled syringe Arrival Date: 03/25/18 Lot #: [id number] Exp date: 03/2020

## 2018-03-29 ENCOUNTER — Ambulatory Visit: Payer: Medicare Other

## 2018-03-30 ENCOUNTER — Telehealth: Payer: Self-pay | Admitting: Internal Medicine

## 2018-03-30 ENCOUNTER — Other Ambulatory Visit: Payer: Self-pay | Admitting: Internal Medicine

## 2018-03-30 ENCOUNTER — Ambulatory Visit (INDEPENDENT_AMBULATORY_CARE_PROVIDER_SITE_OTHER): Payer: Medicare Other

## 2018-03-30 DIAGNOSIS — J4550 Severe persistent asthma, uncomplicated: Secondary | ICD-10-CM | POA: Diagnosis not present

## 2018-03-30 MED ORDER — DOXYCYCLINE HYCLATE 100 MG PO TABS: 100.0000 mg | ORAL_TABLET | Freq: Two times a day (BID) | ORAL | 0 refills | Status: DC

## 2018-03-30 MED ORDER — DUPILUMAB 300 MG/2ML ~~LOC~~ SOSY
300.0000 mg | PREFILLED_SYRINGE | Freq: Once | SUBCUTANEOUS | Status: AC
  Administered 2018-03-30: 300 mg via SUBCUTANEOUS

## 2018-03-30 NOTE — Telephone Encounter (Signed)
Lot of allergis. Recommend  Take doxycycline 100mg  po twice daily x 7 days; take after meals and avoid sunlight. Ensure hydration. Watch for nausea    Allergies  Allergen Reactions  . Gabapentin Nausea Only  . Eggs Or Egg-Derived Products     RAW EGGS.. Can eat cooked eggs  . Influenza Vaccines     Egg allergy   . Nucala [Mepolizumab]   . Cephalosporins Rash  . Codeine Rash  . Levofloxacin Rash  . Pneumococcal Vaccines Rash

## 2018-03-30 NOTE — Telephone Encounter (Signed)
Patient returned call, CB is [phone]

## 2018-03-30 NOTE — Telephone Encounter (Signed)
Pt was at office today, 03/30/18 to receive her next Dupixent injection. Was approached by Alroy Bailiff due to pt having some sinus issues going on and she wanted to know if it would be okay for pt to still be able to receive her Dupixent.  Sent a message to MR and he wanted me to ask pt if she was having any fever or other symptoms other than her sinuses.  Pt stated she was coughing up some clear mucus, but main problems were sinus drainage and pressure in her sinuses.  Per MR, okay for pt to still receive her Dupixent injection and ask pt if she would like meds called in to help with her symptoms.  Pt stated she would like to have something called in to help with her symptoms. MR, please advise on this for pt. Thanks!

## 2018-03-30 NOTE — Telephone Encounter (Signed)
Called patient unable to reach left message to give us a call back.

## 2018-03-30 NOTE — Telephone Encounter (Signed)
Called and spoke with pt letting her know that we were sending a Rx of doxycycline to her pharmacy. Pt expressed understanding. I verified pt's preferred pharmacy and sent Rx in for pt. Nothing further needed.

## 2018-04-01 ENCOUNTER — Other Ambulatory Visit: Payer: Self-pay | Admitting: Cardiovascular Disease

## 2018-04-13 ENCOUNTER — Ambulatory Visit (INDEPENDENT_AMBULATORY_CARE_PROVIDER_SITE_OTHER): Payer: Medicare Other

## 2018-04-13 DIAGNOSIS — J4550 Severe persistent asthma, uncomplicated: Secondary | ICD-10-CM

## 2018-04-14 MED ORDER — DUPILUMAB 300 MG/2ML ~~LOC~~ SOSY
300.0000 mg | PREFILLED_SYRINGE | Freq: Once | SUBCUTANEOUS | Status: AC
  Administered 2018-04-13: 300 mg via SUBCUTANEOUS

## 2018-04-14 NOTE — Progress Notes (Signed)
Documentation of medication administration and charges of Dupxient have been completed by Desmond Dike, CMA based on the hand written Dupxient documentation sheet completed by Alroy Bailiff, who administered the medication.

## 2018-04-21 ENCOUNTER — Telehealth: Payer: Self-pay | Admitting: Internal Medicine

## 2018-04-21 NOTE — Telephone Encounter (Signed)
1 prefilled syringe Arrival Date: 04/21/18 Lot #:[id number] Exp date: 03/2020  Pt ordered.

## 2018-04-22 ENCOUNTER — Telehealth: Payer: Self-pay | Admitting: Internal Medicine

## 2018-04-22 NOTE — Telephone Encounter (Signed)
Filled out redetermination, signed and faxed it. Mrs. Coop called me in the meantime, and gave me a # to call.I called that #, The rep said she would send the form, I told her I had it, she said go ahead and send it in. I did, I was going to anyway. Will wait to see if it gets approved. It usually takes up to 15 bus. Days.(Jan. 2nd 2020)

## 2018-04-22 NOTE — Telephone Encounter (Signed)
Will route this over to [employer].

## 2018-04-23 NOTE — Telephone Encounter (Signed)
Spoke with Cierra at [facility] states the PA we have on file for patients Dupixent injections is still good for 06/27/17 through 09/25/18. We are able to continue ordering medications as usual. Alroy Bailiff is aware. Nothing more needed at this time.

## 2018-04-24 ENCOUNTER — Other Ambulatory Visit: Payer: Self-pay | Admitting: Cardiovascular Disease

## 2018-04-27 ENCOUNTER — Ambulatory Visit (INDEPENDENT_AMBULATORY_CARE_PROVIDER_SITE_OTHER): Payer: Medicare Other

## 2018-04-27 DIAGNOSIS — J4550 Severe persistent asthma, uncomplicated: Secondary | ICD-10-CM

## 2018-04-27 MED ORDER — DUPILUMAB 300 MG/2ML ~~LOC~~ SOSY
300.0000 mg | PREFILLED_SYRINGE | Freq: Once | SUBCUTANEOUS | Status: AC
  Administered 2018-04-27: 300 mg via SUBCUTANEOUS

## 2018-04-27 NOTE — Progress Notes (Signed)
Documented by Jessica Jones CMA based on hand-written Dupixent documentation sheet completed by Tammy Scott CMA, who administered the medication.  Patient answered the following questions per the hand-written documentation on the Dupixent Injection sheet completed by Tammy Scott CMA, who administered the medication:  Have you had a change in your insurance?  NO. Have you been in the hospital in the past 10 days?  NO. Do you have a fever?  NO. Do you have a cough?  YES.  Dry.  

## 2018-04-30 ENCOUNTER — Other Ambulatory Visit: Payer: Self-pay | Admitting: Cardiovascular Disease

## 2018-04-30 NOTE — Telephone Encounter (Signed)
Please advise on refill request as patients last lipid panel in epic was 03/19/17. Okay to refill? Thanks, MI

## 2018-05-03 NOTE — Telephone Encounter (Signed)
Patient had lab work with PCP September 2019

## 2018-05-09 DIAGNOSIS — J069 Acute upper respiratory infection, unspecified: Secondary | ICD-10-CM | POA: Diagnosis not present

## 2018-05-11 ENCOUNTER — Ambulatory Visit (INDEPENDENT_AMBULATORY_CARE_PROVIDER_SITE_OTHER): Payer: Medicare Other

## 2018-05-11 DIAGNOSIS — J4550 Severe persistent asthma, uncomplicated: Secondary | ICD-10-CM | POA: Diagnosis not present

## 2018-05-17 ENCOUNTER — Encounter: Payer: Self-pay | Admitting: Primary Care

## 2018-05-17 ENCOUNTER — Ambulatory Visit (INDEPENDENT_AMBULATORY_CARE_PROVIDER_SITE_OTHER)
Admission: RE | Admit: 2018-05-17 | Discharge: 2018-05-17 | Disposition: A | Discharge status: Home / Self Care | Payer: Medicare Other | Source: Ambulatory Visit | Attending: Primary Care | Admitting: Primary Care

## 2018-05-17 ENCOUNTER — Telehealth: Payer: Self-pay | Admitting: Internal Medicine

## 2018-05-17 ENCOUNTER — Ambulatory Visit (INDEPENDENT_AMBULATORY_CARE_PROVIDER_SITE_OTHER): Payer: Medicare Other | Admitting: Primary Care

## 2018-05-17 VITALS — BP 140/68 | HR 103 | Temp 98.4°F | Ht 64.0 in | Wt 155.8 lb

## 2018-05-17 DIAGNOSIS — J4551 Severe persistent asthma with (acute) exacerbation: Secondary | ICD-10-CM

## 2018-05-17 DIAGNOSIS — R05 Cough: Secondary | ICD-10-CM | POA: Diagnosis not present

## 2018-05-17 MED ORDER — DUPILUMAB 300 MG/2ML ~~LOC~~ SOSY
300.0000 mg | PREFILLED_SYRINGE | Freq: Once | SUBCUTANEOUS | Status: AC
  Administered 2018-05-11: 300 mg via SUBCUTANEOUS

## 2018-05-17 MED ORDER — PREDNISONE 10 MG PO TABS: ORAL_TABLET | ORAL | 0 refills | Status: DC

## 2018-05-17 NOTE — Progress Notes (Signed)
Documented by Lorel Lembo CMA based on hand-written Dupixent documentation sheet completed by Tammy Scott CMA, who administered the medication.  Patient answered the following questions per the hand-written documentation on the Dupixent Injection sheet completed by Tammy Scott CMA, who administered the medication:  Have you had a change in your insurance?  NO. Have you been in the hospital in the past 10 days?  NO. Do you have a fever?  NO. Do you have a cough?  YES.  Dry.  

## 2018-05-17 NOTE — Patient Instructions (Addendum)
Bronchitis with acute asthma exacerbation   CXR today  RX prednisone taper as prescribed  Take Delsym cough syrup 5-17ml every 12 hours and Mucinex twice a day   Continue Symbicort two puffs twice daily  Use Albuterol nebulizer every 6 hours for shortness of breath or wheezing   Follow-up in 1 week (1/14 at 9am, please schedule)

## 2018-05-17 NOTE — Telephone Encounter (Signed)
1 prefilled syringe Ordered Date: 05/17/2018 Shipping Date: 05/20/2018

## 2018-05-17 NOTE — Progress Notes (Signed)
@[name], female    DOB: 1932/12/05, 85 y.o.   MRN: [id number]  Chief Complaint  Patient presents with  . Acute Visit    cough with clear mucus, chest tightness and wheezing, head congestion    Referring provider: Crist Infante, MD  HPI: 85 year old female, never smoked.  Past medical history significant for severe persistent asthma with eosinophilia, chronic sinusitis, DVT (on Xarelto), GERD, chronic diastolic heart failure, hypertension, lung nodule.  Patient [name]. Chase [name], last seen 03/08/2018. Maintained on Dupixent injections, Symbicort 160, spiriva, singulair and prn albuterol.  05/17/2018 Patient presents today with acute complaints of cough and congestion. Accompanied by husband. Productive cough with clear mucus since 05/05/18. Husband and brother were both sick. She was seen at [facility] on 12/29 and given steroid injection and amoxicillin course. Finished antibiotic 2 days ago. Did not notice much of an improvement, if any. She has taken mucinex and delsym for cough. Hasn't had to use rescue inhaler or nebulizer. Due for Dupixent injection on 05/25/18. Did not receive flu injection d/t allergy. Afebrile.    Allergies  Allergen Reactions  . Gabapentin Nausea Only  . Eggs Or Egg-Derived Products     RAW EGGS.. Can eat cooked eggs  . Influenza Vaccines     Egg allergy   . Nucala [Mepolizumab]   . Cephalosporins Rash  . Codeine Rash  . Levofloxacin Rash  . Pneumococcal Vaccines Rash    Immunization History  Administered Date(s) Administered  . Pneumococcal Conjugate-13 09/14/2012  . Pneumococcal Polysaccharide-23 01/31/2009  . Zoster Recombinat (Shingrix) 04/18/2017, 09/16/2017    Past Medical History:  Diagnosis Date  . Asthma, severe persistent    pulmologist-- [name]  . Chronic kidney disease, stage II (mild)   . DDD (degenerative disc disease), cervical   . Diverticulosis yrs ago   hx of   . Edema, lower extremity    occ both legs swell  .  GERD (gastroesophageal reflux disease)   . Heart murmur   . History of breast cancer 1990 left mastectomy also   1989  S/P   RIGHT MASTECTOMY ;  NO CHEMORADIATION //   NO RECURRENCE  . History of DVT of lower extremity 5 yrs ago   right leg  . History of shingles   . Hyperlipidemia   . Leg ulcer, left ([place])   . Osteoporosis, unspecified    Knee and hip osteoarthritis bilaterally  . Perennial allergic rhinitis   . Restless legs syndrome (RLS)   . Rheumatoid arthritis ([place])    hands  . Thyroid nodule    followed by [name] yearly, no current problam  . Unspecified essential hypertension     Tobacco History: Social History   Tobacco Use  Smoking Status Never Smoker  Smokeless Tobacco Never Used   Counseling given: Not Answered   Outpatient Medications Prior to Visit  Medication Sig Dispense Refill  . acetaminophen (TYLENOL) 500 MG tablet Take 2 tablets (1,000 mg total) by mouth every 8 (eight) hours. 30 tablet 0  . albuterol (PROAIR HFA) 108 (90 Base) MCG/ACT inhaler Inhale 2 puffs into the lungs every 6 (six) hours as needed for wheezing or shortness of breath. 18 g 5  . albuterol (PROVENTIL) (2.5 MG/3ML) 0.083% nebulizer solution Take 3 mLs (2.5 mg total) by nebulization every 6 (six) hours as needed for wheezing or shortness of breath. Dx: J45.41 360 mL 0  . benzonatate (TESSALON) 100 MG capsule Take 1 capsule (100 mg total) by mouth 3 (three)  times daily as needed for cough. 100 capsule 1  . budesonide-formoterol (SYMBICORT) 160-4.5 MCG/ACT inhaler Inhale 2 puffs into the lungs 2 (two) times daily. 30.6 Inhaler 1  . Calcium Carbonate-Vitamin D (CALTRATE 600+D) 600-400 MG-UNIT per tablet Take 1 tablet by mouth daily.     . chlorpheniramine (CHLOR-TRIMETON) 4 MG tablet Take by mouth. 4 mg in the morning and 8 mg at bedtime    . Cholecalciferol (VITAMIN D3) 1000 UNITS tablet Take 1,000 Units by mouth every evening.     [name] dextromethorphan (DELSYM) 30 MG/5ML liquid Take 60 mg  by mouth 2 (two) times daily as needed for cough.    . dextromethorphan-guaiFENesin (MUCINEX DM) 30-600 MG 12hr tablet Take 1 tablet by mouth daily as needed for cough.    . docusate sodium (COLACE) 100 MG capsule Take 1 capsule (100 mg total) by mouth 2 (two) times daily. 10 capsule 0  . Dupilumab (DUPIXENT) 300 MG/2ML SOSY Inject 300 mg into the skin every 14 (fourteen) days. 2 Syringe 6  . fexofenadine (ALLEGRA) 180 MG tablet Take 180 mg by mouth daily.      . furosemide (LASIX) 20 MG tablet Take 20 mg by mouth daily.    . furosemide (LASIX) 20 MG tablet Take 1 tablet (20 mg total) by mouth daily as needed for fluid or edema. 90 tablet 3  . mometasone (NASONEX) 50 MCG/ACT nasal spray Place 2 sprays into the nose daily. 51 g 11  . montelukast (SINGULAIR) 10 MG tablet TAKE 1 TABLET BY MOUTH EVERYDAY AT BEDTIME 90 tablet 1  . Multiple Vitamin (MULTIVITAMIN) tablet Take 1 tablet by mouth daily.    [name] NEXIUM 40 MG capsule TAKE 1 CAPSULE (40 MG TOTAL) BY MOUTH 2 (TWO) TIMES DAILY. NAME BRAND ONLY 180 capsule 3  . potassium chloride (K-DUR) 10 MEQ tablet TAKE 1 TABLET BY MOUTH EVERY DAY 90 tablet 3  . pramipexole (MIRAPEX) 0.125 MG tablet Take 0.375 mg by mouth at bedtime.     . rosuvastatin (CRESTOR) 5 MG tablet TAKE 1 TABLET BY MOUTH EVERYDAY AT BEDTIME 90 tablet 3  . Spacer/Aero-Holding Chambers (AEROCHAMBER MV) inhaler by Other route. Use as instructed     . telmisartan (MICARDIS) 20 MG tablet TAKE 1 TABLET BY MOUTH EVERYDAY AT BEDTIME 90 tablet 3  . Tiotropium Bromide Monohydrate (SPIRIVA RESPIMAT) 2.5 MCG/ACT AERS Inhale 2 puffs into the lungs daily. 3 Inhaler 3  . traMADol (ULTRAM) 50 MG tablet Take 1 tablet (50 mg total) by mouth every 6 (six) hours as needed. 20 tablet 0  . vitamin B-12 (CYANOCOBALAMIN) 500 MCG tablet Take 500 mcg by mouth daily.      [name] Reasons 20 MG TABS tablet Take 20 mg by mouth daily with supper.     . doxycycline (VIBRA-TABS) 100 MG tablet Take 1 tablet (100 mg total) by  mouth 2 (two) times daily. Take after meals and avoid sunlight. Ensure hydration. Watch for nausea. 14 tablet 0   Facility-Administered Medications Prior to Visit  Medication Dose Route Frequency Provider Last Rate Last Dose  . omalizumab Arvid Right) injection 300 mg  300 mg Subcutaneous Q14 Days Brand Males, MD   300 mg at 07/27/17 1013  . omalizumab Arvid Right) injection 300 mg  300 mg Subcutaneous Q14 Days Brand Males, MD   300 mg at 08/13/17 0815  . omalizumab Arvid Right) injection 300 mg  300 mg Subcutaneous Q14 Days Brand Males, MD   300 mg at 08/27/17 0950      Review  of Systems  Review of Systems  Constitutional: Negative.   HENT: Positive for congestion.   Respiratory: Positive for cough and wheezing. Negative for shortness of breath.   Cardiovascular: Negative.      Physical Exam  BP 140/68 (BP Location: Left Arm, Cuff Size: Normal)   Pulse (!) 103   Temp 98.4 F (36.9 C)   Ht 5\' 4"  (1.626 m)   Wt 155 lb 12.8 oz (70.7 kg)   SpO2 98%   BMI 26.74 kg/m  Physical Exam Constitutional:      General: She is not in acute distress.    Appearance: Normal appearance.  HENT:     Head: Normocephalic and atraumatic.     Right Ear: Tympanic membrane normal.     Left Ear: Tympanic membrane normal.     Nose: Nose normal.     Mouth/Throat:     Mouth: Mucous membranes are moist.     Pharynx: Oropharynx is clear.  Eyes:     Extraocular Movements: Extraocular movements intact.     Pupils: Pupils are equal, round, and reactive to light.  Cardiovascular:     Rate and Rhythm: Regular rhythm. Tachycardia present.  Pulmonary:     Effort: Pulmonary effort is normal. No respiratory distress.     Breath sounds: Wheezing and rhonchi present.  Musculoskeletal: Normal range of motion.  Skin:    General: Skin is warm and dry.  Neurological:     General: No focal deficit present.     Mental Status: She is alert and oriented to person, place, and time. Mental status is at  baseline.  Psychiatric:        Mood and Affect: Mood normal.        Behavior: Behavior normal.        Thought Content: Thought content normal.        Judgment: Judgment normal.      Lab Results:  CBC    Component Value Date/Time   WBC 7.8 12/05/2017 0513   RBC 3.42 (L) 12/05/2017 0513   HGB 10.2 (L) 12/05/2017 0513   HCT 31.6 (L) 12/05/2017 0513   PLT 217 12/05/2017 0513   MCV 92.4 12/05/2017 0513   MCH 29.8 12/05/2017 0513   MCHC 32.3 12/05/2017 0513   RDW 13.3 12/05/2017 0513   LYMPHSABS 1.1 12/19/2016 1650   MONOABS 0.7 12/19/2016 1650   EOSABS 0.3 12/19/2016 1650   BASOSABS 0.0 12/19/2016 1650    BMET    Component Value Date/Time   NA 138 12/05/2017 0513   NA 138 03/19/2017 1009   K 4.7 12/05/2017 0513   CL 103 12/05/2017 0513   CO2 28 12/05/2017 0513   GLUCOSE 107 (H) 12/05/2017 0513   BUN 28 (H) 12/05/2017 0513   BUN 24 03/19/2017 1009   CREATININE 1.38 (H) 12/05/2017 0513   CREATININE 1.34 (H) 09/12/2015 1125   CALCIUM 8.6 (L) 12/05/2017 0513   GFRNONAA 35 (L) 12/05/2017 0513   GFRAA 40 (L) 12/05/2017 0513    BNP No results found for: BNP  ProBNP No results found for: PROBNP  Imaging: No results found.   Assessment & Plan:   Severe persistent asthma - Acute exacerbation d/t bronchitis - CXR today - RX prednisone taper as prescribed - Take Delsym cough syrup 5-47ml every 12 hours and Mucinex twice a day  - Continue Symbicort two puffs twice daily - Albuterol nebulizer every 6 hours for shortness of breath or wheezing  - Follow-up in 1 week or sooner  if symptoms worsen    Martyn Ehrich, NP 05/17/2018

## 2018-05-17 NOTE — Assessment & Plan Note (Signed)
-   Acute exacerbation d/t bronchitis - CXR today - RX prednisone taper as prescribed - Take Delsym cough syrup 5-61ml every 12 hours and Mucinex twice a day  - Continue Symbicort two puffs twice daily - Albuterol nebulizer every 6 hours for shortness of breath or wheezing  - Follow-up in 1 week or sooner if symptoms worsen

## 2018-05-20 NOTE — Telephone Encounter (Signed)
1 prefilled syringe Arrival Date: 05/20/2018 Lot #:[id number] Exp date: 04/2020

## 2018-05-24 NOTE — Progress Notes (Addendum)
@[name], female    DOB: [birth year], 85 y.o.   MRN: [id number]  Chief Complaint  Patient presents with  . Follow-up    cough with clear mucus, SOB with exertion,     Referring provider: Crist Infante, MD  HPI: 85 year old female, never smoked.  Past medical history significant for severe persistent asthma with eosinophilia, chronic sinusitis, DVT (on Xarelto), GERD, chronic diastolic heart failure, hypertension, lung nodule.  Patient Dr. Chase Caller, last seen 03/08/2018. Maintained on Dupixent 300mg  injections q 14days, Symbicort 160 BID, Spiriva, singulair, allegra and prn albuterol.  05/17/2018 Patient presents today with acute complaints of cough and congestion. Accompanied by husband. Productive cough with clear mucus since 05/05/18. Husband and brother were both sick. She was seen at [facility] on 12/29 and given steroid injection and amoxicillin course. Finished antibiotic 2 days ago. Did not notice much of an improvement, if any. She has taken mucinex and delsym for cough. Hasn't had to use rescue inhaler or nebulizer. Due for Dupixent injection on 05/25/18. Did not receive flu injection d/t allergy. Afebrile.   05/25/2018 Patient presents for 1 week follow-up for asthma exacerbation. CXR showed no acute cardiopulmonary disease. Treated with prednisone taper. Due for dupixent injection today. Feeling better than she did last week, still has a cough but sputum has cleared. Felt better yesterday, rain made her symptoms worse. States anytime the weather changes her asthma flares. Using nebulizer as needed, didn't use it as much yesterday. Required a treatment this morning. Takes Singulair and allegra. Afebrile.    Allergies  Allergen Reactions  . Gabapentin Nausea Only  . Eggs Or Egg-Derived Products     RAW EGGS.. Can eat cooked eggs  . Influenza Vaccines     Egg allergy   . Nucala [Mepolizumab]   . Cephalosporins Rash  . Codeine Rash  . Levofloxacin Rash  . Pneumococcal  Vaccines Rash    Immunization History  Administered Date(s) Administered  . Pneumococcal Conjugate-13 09/14/2012  . Pneumococcal Polysaccharide-23 01/31/2009  . Zoster Recombinat (Shingrix) 04/18/2017, 09/16/2017    Past Medical History:  Diagnosis Date  . Asthma, severe persistent    pulmologist-- dr Lynford Citizen  . Chronic kidney disease, stage II (mild)   . DDD (degenerative disc disease), cervical   . Diverticulosis yrs ago   hx of   . Edema, lower extremity    occ both legs swell  . GERD (gastroesophageal reflux disease)   . Heart murmur   . History of breast cancer 1990 left mastectomy also   1989  S/P   RIGHT MASTECTOMY ;  NO CHEMORADIATION //   NO RECURRENCE  . History of DVT of lower extremity 5 yrs ago   right leg  . History of shingles   . Hyperlipidemia   . Leg ulcer, left ([place])   . Osteoporosis, unspecified    Knee and hip osteoarthritis bilaterally  . Perennial allergic rhinitis   . Restless legs syndrome (RLS)   . Rheumatoid arthritis ([place])    hands  . Thyroid nodule    followed by dr perrini yearly, no current problam  . Unspecified essential hypertension     Tobacco History: Social History   Tobacco Use  Smoking Status Never Smoker  Smokeless Tobacco Never Used   Counseling given: Not Answered   Outpatient Medications Prior to Visit  Medication Sig Dispense Refill  . acetaminophen (TYLENOL) 500 MG tablet Take 2 tablets (1,000 mg total) by mouth every 8 (eight) hours. 30 tablet 0  .  albuterol (PROAIR HFA) 108 (90 Base) MCG/ACT inhaler Inhale 2 puffs into the lungs every 6 (six) hours as needed for wheezing or shortness of breath. 18 g 5  . albuterol (PROVENTIL) (2.5 MG/3ML) 0.083% nebulizer solution Take 3 mLs (2.5 mg total) by nebulization every 6 (six) hours as needed for wheezing or shortness of breath. Dx: J45.41 360 mL 0  . benzonatate (TESSALON) 100 MG capsule Take 1 capsule (100 mg total) by mouth 3 (three) times daily as needed for cough.  100 capsule 1  . budesonide-formoterol (SYMBICORT) 160-4.5 MCG/ACT inhaler Inhale 2 puffs into the lungs 2 (two) times daily. 30.6 Inhaler 1  . Calcium Carbonate-Vitamin D (CALTRATE 600+D) 600-400 MG-UNIT per tablet Take 1 tablet by mouth daily.     . chlorpheniramine (CHLOR-TRIMETON) 4 MG tablet Take by mouth. 4 mg in the morning and 8 mg at bedtime    . Cholecalciferol (VITAMIN D3) 1000 UNITS tablet Take 1,000 Units by mouth every evening.     [name] dextromethorphan (DELSYM) 30 MG/5ML liquid Take 60 mg by mouth 2 (two) times daily as needed for cough.    . dextromethorphan-guaiFENesin (MUCINEX DM) 30-600 MG 12hr tablet Take 1 tablet by mouth daily as needed for cough.    . docusate sodium (COLACE) 100 MG capsule Take 1 capsule (100 mg total) by mouth 2 (two) times daily. 10 capsule 0  . Dupilumab (DUPIXENT) 300 MG/2ML SOSY Inject 300 mg into the skin every 14 (fourteen) days. 2 Syringe 6  . fexofenadine (ALLEGRA) 180 MG tablet Take 180 mg by mouth daily.      . furosemide (LASIX) 20 MG tablet Take 20 mg by mouth daily.    . mometasone (NASONEX) 50 MCG/ACT nasal spray Place 2 sprays into the nose daily. 51 g 11  . montelukast (SINGULAIR) 10 MG tablet TAKE 1 TABLET BY MOUTH EVERYDAY AT BEDTIME 90 tablet 1  . Multiple Vitamin (MULTIVITAMIN) tablet Take 1 tablet by mouth daily.    [name] NEXIUM 40 MG capsule TAKE 1 CAPSULE (40 MG TOTAL) BY MOUTH 2 (TWO) TIMES DAILY. NAME BRAND ONLY 180 capsule 3  . potassium chloride (K-DUR) 10 MEQ tablet TAKE 1 TABLET BY MOUTH EVERY DAY 90 tablet 3  . pramipexole (MIRAPEX) 0.125 MG tablet Take 0.375 mg by mouth at bedtime.     . predniSONE (DELTASONE) 10 MG tablet Take 4 tabs po daily x 3 days; then 3 tabs daily x3 days; then 2 tabs daily x3 days; then 1 tab daily x 3 days; then stop 30 tablet 0  . rosuvastatin (CRESTOR) 5 MG tablet TAKE 1 TABLET BY MOUTH EVERYDAY AT BEDTIME 90 tablet 3  . Spacer/Aero-Holding Chambers (AEROCHAMBER MV) inhaler by Other route. Use as  instructed     . telmisartan (MICARDIS) 20 MG tablet TAKE 1 TABLET BY MOUTH EVERYDAY AT BEDTIME 90 tablet 3  . Tiotropium Bromide Monohydrate (SPIRIVA RESPIMAT) 2.5 MCG/ACT AERS Inhale 2 puffs into the lungs daily. 3 Inhaler 3  . traMADol (ULTRAM) 50 MG tablet Take 1 tablet (50 mg total) by mouth every 6 (six) hours as needed. 20 tablet 0  . vitamin B-12 (CYANOCOBALAMIN) 500 MCG tablet Take 500 mcg by mouth daily.      [name] Reasons 20 MG TABS tablet Take 20 mg by mouth daily with supper.     . furosemide (LASIX) 20 MG tablet Take 1 tablet (20 mg total) by mouth daily as needed for fluid or edema. 90 tablet 3  . ibandronate (BONIVA) 150 MG  tablet Take 1 tablet by mouth daily.  3   Facility-Administered Medications Prior to Visit  Medication Dose Route Frequency Provider Last Rate Last Dose  . omalizumab Arvid Right) injection 300 mg  300 mg Subcutaneous Q14 Days Brand Males, MD   300 mg at 07/27/17 1013  . omalizumab Arvid Right) injection 300 mg  300 mg Subcutaneous Q14 Days Brand Males, MD   300 mg at 08/13/17 0815  . omalizumab Arvid Right) injection 300 mg  300 mg Subcutaneous Q14 Days Brand Males, MD   300 mg at 08/27/17 9371     Review of Systems  Review of Systems  Constitutional: Negative.   HENT: Positive for congestion.   Respiratory: Positive for cough and chest tightness. Negative for shortness of breath and wheezing.   Cardiovascular: Negative.     Physical Exam  BP (!) 142/64   Pulse 99   Temp 98.2 F (36.8 C)   Ht 5\' 4"  (1.626 m)   Wt 158 lb 9.6 oz (71.9 kg)   SpO2 99%   BMI 27.22 kg/m  Physical Exam Constitutional:      General: She is not in acute distress.    Appearance: She is normal weight. She is not ill-appearing.  HENT:     Head: Normocephalic and atraumatic.     Right Ear: Tympanic membrane normal.     Left Ear: Tympanic membrane normal.     Mouth/Throat:     Mouth: Mucous membranes are moist.     Pharynx: Oropharynx is clear.  Neck:      Musculoskeletal: Normal range of motion and neck supple.  Cardiovascular:     Rate and Rhythm: Normal rate and regular rhythm.  Pulmonary:     Effort: No respiratory distress.     Breath sounds: No wheezing.     Comments: Tight cough  Neurological:     General: No focal deficit present.     Mental Status: She is alert and oriented to person, place, and time. Mental status is at baseline.  Psychiatric:        Mood and Affect: Mood normal.        Behavior: Behavior normal.        Thought Content: Thought content normal.        Judgment: Judgment normal.      Lab Results:  CBC    Component Value Date/Time   WBC 7.8 12/05/2017 0513   RBC 3.42 (L) 12/05/2017 0513   HGB 10.2 (L) 12/05/2017 0513   HCT 31.6 (L) 12/05/2017 0513   PLT 217 12/05/2017 0513   MCV 92.4 12/05/2017 0513   MCH 29.8 12/05/2017 0513   MCHC 32.3 12/05/2017 0513   RDW 13.3 12/05/2017 0513   LYMPHSABS 1.1 12/19/2016 1650   MONOABS 0.7 12/19/2016 1650   EOSABS 0.3 12/19/2016 1650   BASOSABS 0.0 12/19/2016 1650    BMET    Component Value Date/Time   NA 138 12/05/2017 0513   NA 138 03/19/2017 1009   K 4.7 12/05/2017 0513   CL 103 12/05/2017 0513   CO2 28 12/05/2017 0513   GLUCOSE 107 (H) 12/05/2017 0513   BUN 28 (H) 12/05/2017 0513   BUN 24 03/19/2017 1009   CREATININE 1.38 (H) 12/05/2017 0513   CREATININE 1.34 (H) 09/12/2015 1125   CALCIUM 8.6 (L) 12/05/2017 0513   GFRNONAA 35 (L) 12/05/2017 0513   GFRAA 40 (L) 12/05/2017 0513    BNP No results found for: BNP  ProBNP No results found for: PROBNP  Imaging:  Dg Chest 2 View  Result Date: 05/17/2018 CLINICAL DATA:  Productive cough, chest tightness, and wheezing. EXAM: CHEST - 2 VIEW COMPARISON:  Two-view chest x-ray 02/04/2017. CT of the chest 08/17/2017. FINDINGS: Lungs are hyperinflated, indicating COPD. There is no edema or effusion. No focal airspace disease is present. The visualized soft tissues and bony thorax are unremarkable. IMPRESSION:  1. Similar changes of COPD. 2. No acute cardiopulmonary disease. Electronically Signed   By: San Morelle M.D.   On: 05/17/2018 11:18     Assessment & Plan:   Severe persistent asthma Acute exacerbation somewhat improved, has a few days left of prednisone taper Weather worsens her symptoms  Continues Symbicort 160 BID, Spiriva, Singulair and Albuterol nebulizer as needed Receives Dupixent 300mg  q14 days, due for injection today Return if symptoms do not improve  Next follow-up on Jan 28th with Dr. Chase Caller - consider rechecking [place], NP 05/25/2018

## 2018-05-25 ENCOUNTER — Encounter: Payer: Self-pay | Admitting: Primary Care

## 2018-05-25 ENCOUNTER — Ambulatory Visit (INDEPENDENT_AMBULATORY_CARE_PROVIDER_SITE_OTHER): Payer: Medicare Other

## 2018-05-25 ENCOUNTER — Ambulatory Visit (INDEPENDENT_AMBULATORY_CARE_PROVIDER_SITE_OTHER): Payer: Medicare Other | Admitting: Primary Care

## 2018-05-25 VITALS — BP 142/64 | HR 99 | Temp 98.2°F | Ht 64.0 in | Wt 158.6 lb

## 2018-05-25 DIAGNOSIS — J4551 Severe persistent asthma with (acute) exacerbation: Secondary | ICD-10-CM | POA: Diagnosis not present

## 2018-05-25 NOTE — Assessment & Plan Note (Addendum)
Acute exacerbation somewhat improved, has a few days left of prednisone taper Weather worsens her symptoms  Continues Symbicort 160 BID, Spiriva, Singulair and Albuterol nebulizer as needed Receives Dupixent 300mg  q14 days, due for injection today Return if symptoms do not improve  Next follow-up on Jan 28th with Dr. Chase Caller - consider rechecking FENO

## 2018-05-25 NOTE — Patient Instructions (Signed)
Ok to get Dupixent injection today  Finish prednisone taper Continue Symbicort twice daily, Albuterol Nebulizer's every 4-6 hours for shortness of breath/wheezing Continue Mucinex and delsym twice daily  Will call and check on you on Friday  Please return sooner if symptoms worsen or not better      Asthma, Adult  Asthma is a long-term (chronic) condition in which the airways get tight and narrow. The airways are the breathing passages that lead from the nose and mouth down into the lungs. A person with asthma will have times when symptoms get worse. These are called asthma attacks. They can cause coughing, whistling sounds when you breathe (wheezing), shortness of breath, and chest pain. They can make it hard to breathe. There is no cure for asthma, but medicines and lifestyle changes can help control it. There are many things that can bring on an asthma attack or make asthma symptoms worse (triggers). Common triggers include:  Mold.  Dust.  Cigarette smoke.  Cockroaches.  Things that can cause allergy symptoms (allergens). These include animal skin flakes (dander) and pollen from trees or grass.  Things that pollute the air. These may include household cleaners, wood smoke, smog, or chemical odors.  Cold air, weather changes, and wind.  Crying or laughing hard.  Stress.  Certain medicines or drugs.  Certain foods such as dried fruit, potato chips, and grape juice.  Infections, such as a cold or the flu.  Certain medical conditions or diseases.  Exercise or tiring activities. Asthma may be treated with medicines and by staying away from the things that cause asthma attacks. Types of medicines may include:  Controller medicines. These help prevent asthma symptoms. They are usually taken every day.  Fast-acting reliever or rescue medicines. These quickly relieve asthma symptoms. They are used as needed and provide short-term relief.  Allergy medicines if your attacks  are brought on by allergens.  Medicines to help control the body's defense (immune) system. Follow these instructions at home: Avoiding triggers in your home  Change your heating and air conditioning filter often.  Limit your use of fireplaces and wood stoves.  Get rid of pests (such as roaches and mice) and their droppings.  Throw away plants if you see mold on them.  Clean your floors. Dust regularly. Use cleaning products that do not smell.  Have someone vacuum when you are not home. Use a vacuum cleaner with a HEPA filter if possible.  Replace carpet with wood, tile, or vinyl flooring. Carpet can trap animal skin flakes and dust.  Use allergy-proof pillows, mattress covers, and box spring covers.  Wash bed sheets and blankets every week in hot water. Dry them in a dryer.  Keep your bedroom free of any triggers.  Avoid pets and keep windows closed when things that cause allergy symptoms are in the air.  Use blankets that are made of polyester or cotton.  Clean bathrooms and kitchens with bleach. If possible, have someone repaint the walls in these rooms with mold-resistant paint. Keep out of the rooms that are being cleaned and painted.  Wash your hands often with soap and water. If soap and water are not available, use hand sanitizer.  Do not allow anyone to smoke in your home. General instructions  Take over-the-counter and prescription medicines only as told by your doctor. ? Talk with your doctor if you have questions about how or when to take your medicines. ? Make note if you need to use your medicines more often  than usual.  Do not use any products that contain nicotine or tobacco, such as cigarettes and e-cigarettes. If you need help quitting, ask your doctor.  Stay away from secondhand smoke.  Avoid doing things outdoors when allergen counts are high and when air quality is low.  Wear a ski mask when doing outdoor activities in the winter. The mask should  cover your nose and mouth. Exercise indoors on cold days if you can.  Warm up before you exercise. Take time to cool down after exercise.  Use a peak flow meter as told by your doctor. A peak flow meter is a tool that measures how well the lungs are working.  Keep track of the peak flow meter's readings. Write them down.  Follow your asthma action plan. This is a written plan for taking care of your asthma and treating your attacks.  Make sure you get all the shots (vaccines) that your doctor recommends. Ask your doctor about a flu shot and a pneumonia shot.  Keep all follow-up visits as told by your doctor. This is important. Contact a doctor if:  You have wheezing, shortness of breath, or a cough even while taking medicine to prevent attacks.  The mucus you cough up (sputum) is thicker than usual.  The mucus you cough up changes from clear or white to yellow, green, gray, or bloody.  You have problems from the medicine you are taking, such as: ? A rash. ? Itching. ? Swelling. ? Trouble breathing.  You need reliever medicines more than 2-3 times a week.  Your peak flow reading is still at 50-79% of your personal best after following the action plan for 1 hour.  You have a fever. Get help right away if:  You seem to be worse and are not responding to medicine during an asthma attack.  You are short of breath even at rest.  You get short of breath when doing very little activity.  You have trouble eating, drinking, or talking.  You have chest pain or tightness.  You have a fast heartbeat.  Your lips or fingernails start to turn blue.  You are light-headed or dizzy, or you faint.  Your peak flow is less than 50% of your personal best.  You feel too tired to breathe normally. Summary  Asthma is a long-term (chronic) condition in which the airways get tight and narrow. An asthma attack can make it hard to breathe.  Asthma cannot be cured, but medicines and  lifestyle changes can help control it.  Make sure you understand how to avoid triggers and how and when to use your medicines. This information is not intended to replace advice given to you by your health care provider. Make sure you discuss any questions you have with your health care provider. Document Released: 10/15/2007 Document Revised: 06/02/2016 Document Reviewed: 06/02/2016 [employer]vier Interactive Patient Education  2019 [employer].

## 2018-05-27 MED ORDER — DUPILUMAB 300 MG/2ML ~~LOC~~ SOSY
300.0000 mg | PREFILLED_SYRINGE | Freq: Once | SUBCUTANEOUS | Status: AC
  Administered 2018-05-24: 300 mg via SUBCUTANEOUS

## 2018-05-27 NOTE — Progress Notes (Signed)
Patient answered the following questions per the hand-written documentation on the Dupixent Injection sheet:  Have you had a change in your insurance?  no. Have you been in the hospital in the past 10 days?  no. Do you have a fever?  no. Do you have a cough?  Yes, productive but clear. Pt saw [facility] and said to give pt inj.[name]

## 2018-05-28 ENCOUNTER — Other Ambulatory Visit: Payer: Self-pay | Admitting: Internal Medicine

## 2018-06-08 ENCOUNTER — Encounter: Payer: Self-pay | Admitting: Internal Medicine

## 2018-06-08 ENCOUNTER — Ambulatory Visit (INDEPENDENT_AMBULATORY_CARE_PROVIDER_SITE_OTHER): Payer: Medicare Other

## 2018-06-08 ENCOUNTER — Ambulatory Visit (INDEPENDENT_AMBULATORY_CARE_PROVIDER_SITE_OTHER): Payer: Medicare Other | Admitting: Internal Medicine

## 2018-06-08 VITALS — BP 152/72 | HR 94 | Ht 64.0 in | Wt 157.8 lb

## 2018-06-08 DIAGNOSIS — J4551 Severe persistent asthma with (acute) exacerbation: Secondary | ICD-10-CM

## 2018-06-08 DIAGNOSIS — J4550 Severe persistent asthma, uncomplicated: Secondary | ICD-10-CM

## 2018-06-08 DIAGNOSIS — J322 Chronic ethmoidal sinusitis: Secondary | ICD-10-CM | POA: Diagnosis not present

## 2018-06-08 DIAGNOSIS — M7918 Myalgia, other site: Secondary | ICD-10-CM

## 2018-06-08 LAB — IGA: IgA: 210 mg/dL (ref 68–378)

## 2018-06-08 LAB — SEDIMENTATION RATE: Sed Rate: 87 mm/hr — ABNORMAL HIGH (ref 0–30)

## 2018-06-08 MED ORDER — PREDNISONE 10 MG PO TABS: ORAL_TABLET | ORAL | 0 refills | Status: DC

## 2018-06-08 MED ORDER — DOXYCYCLINE HYCLATE 100 MG PO TABS: 100.0000 mg | ORAL_TABLET | Freq: Two times a day (BID) | ORAL | 0 refills | Status: DC

## 2018-06-08 MED ORDER — DUPILUMAB 300 MG/2ML ~~LOC~~ SOSY
300.0000 mg | PREFILLED_SYRINGE | Freq: Once | SUBCUTANEOUS | Status: AC
  Administered 2018-06-08: 300 mg via SUBCUTANEOUS

## 2018-06-08 NOTE — Progress Notes (Signed)
C.C.:  Follow-up for Severe, Persistent Asthma, Chronic Allergic Rhinitis, DVT, & GERD w/ Hiatal Hernia.  HPI Patient has been seen twice in our office since her last appointment with me. Last visit was on 9/26 with nurse practitioner. Patient was treated for an exacerbation of her asthma with bronchitis. She was given a prednisone taper, instructed to use Mucinex twice daily as needed, and a 7 day course of Augmentin. Since her last appointment she fractured her left hip. She is continuing to walk with a cane. She reports no adverse effects from her Xolair. She does feel the Xolair is helping her significantly. Hasn't needed her rescue inhaler significantly in the last few weeks. Only rare nocturnal awakenings with any dyspnea.   Severe, persistent asthma: Patient currently maintained on a regimen of Xolair, Singulair, Spiriva, and Symbicort. She feels her dyspnea has significantly improved. She still has a scratchy throat & hoarse voice. She has her baseline nonproductive cough. No wheezing.  Chronic allergic rhinitis: Previously referred to ENT. Regimen at last appointment with me included Allegra, Singulair, Nasonex, and Xolair. Patient has had multiple different antibiotic courses for recurrent sinusitis. Only having intermittent sinus congestion & drainage.   DVT: Present in bilateral lower extremities. Patient on systemic anticoagulation with Xarelto indefinitely. No melena or hematochezia. No hematuria.   GERD with hiatal hernia: Previously prescribed Nexium. No reflux or dyspepsia. No morning brash water taste.   Review of Systems  No chest pain or tightness. No fever or chills. No abdominal pain or nausea.    OV 06/15/2017  Chief Complaint  Patient presents with  . Follow-up    flare ups with asthma when the weather changes.  Right now it is a cough, with congestions and wheezing. Non-productive dry cough. SOB with any activity.  Hoarseness.   85 year old female.  Transfer of  care from Dr. Ashok Cordia who is left the practice.  Moderate persistent asthma with an elevated IgE.  She is here with her husband.  She tells me that in 2015-2017 she was started on Xolair and then subsequently switched in [place] last year or year before last.  This then caused nonspecific side effect such as erythema in her face and nonspecific pain that reminded her of shingles.  Therefore she discontinued this and the side effects resolved.  She did have 6 doses of the interleukin-5 receptor antibody.  She went back on Xolair.  Overall she feels that since starting Xolair her quality of life and asthma exacerbations have improved but nevertheless she still gets asthma exacerbations every few months and has to go on prednisone.  Currently she states that for the last few to several days she has had increased cough although no change in sputum.  She is also wheezing a little bit more than usual.  Asthma control questionnaire shows for the last week she is waking up a few times at night.  When she wakes up she has moderate symptoms.  Her activities are slightly limited because of asthma.  She is moderate amount of shortness of breath because of asthma and she is wheezing a little of the time and using albuterol for rescue at least 1 or 2 times daily.  Exam nitric oxide is significantly elevated at 125 ppb.  She think she will benefit from prednisone  She she reports to me for the first time a new issue of left anterior cervical lymph node/submandibular lymph node.  She says it is been present for a while but other  physicians were examined it have not felt it.  She asked me to examine it.  She feels it is slowly growing.  Insidious onset for a year.   OV 08/13/2017  Chief Complaint  Patient presents with  . Follow-up    Pt states she has had many flare-ups with her asthma. States she has had a lot of sinus drainage and congestion and a lot of chest tightness.    85 year old female with complicated asthma    poor control of asthma continues.  At my last visit she did a prednisone taper.  Then a few weeks ago her sinuses acted up and therefore her asthma acted up and she did another prednisone taper on herself at home but it was just for a few days.  She continues on Spiriva, Symbicort, Singulair, Nexium and Xolair injections.  She feels that ever since Dr. Asencion Noble retired her asthma has been out of control.  Currently for the last few weeks her sinuses have been acting up and she wants antibiotics.  In particular she wants Augmentin.  She says in the past this was deemed as an allergy but she went and saw some doctor in [place] and it looks like she has been desensitized.  Penicillin is no longer listed as an allergy.  She insists that she wants Augmentin.  Review of the chart shows she has had chronic sinusitis and a CT scan of the sinus 1 year ago in February 2018.  She says she has seen ENT for this.  No surgery has been recommended.  Just nasal hygiene and antibiotic courses.  Currently her symptoms are quite significant with asthma control question of greater than 2 it appears this might all just be her baseline.  Her exam nitric oxide is still high over 100.  At night she is waking up several times.  When she wakes up she has moderate symptoms.  Activities are slightly limited because of asthma.  She has moderate amount of shortness of breath because of asthma.  She is wheezing hardly and she uses albuterol for rescue at least 1 or 2 times daily.   OV  Chief Complaint  Patient presents with  . Follow-up    Pt states her breathing has been much better since she began new injection but states she is coughing more than before. Denies any CP.    Follow-up severe persistent asthma with eosinophilia: In April 2019 we started dupulimab and stop the Xolair. With this asthma control is significantly better. Her asthma control questionnaire average score has drop to 0.6. Her exhaled nitric oxide is  drop from 100s to 43. She says she is much less short of breath and is able to do a lot more things. She is very pleased with the new injectable. However she feels that her baseline chronic cough is slightly worse. It is moderate in intensity is dry. It does not wake up at night. It is not associated with wheezing or shortness of breath. It is associated with Spiriva MDI. She does clear her throat. Coughing is made worse by talking. Off note, she has new issue of left hip surgery coming up in 2 days. She feels from a pulmonary standpoint she will be stable to do the surgery because of prior experience with surgery.        OV 03/08/2018  Subjective:  Patient ID: [name], female , DOB: [birth year] , age 5 y.o. , MRN: [id number] , ADDRESS: [address] [place] [address]  03/08/2018 -   Chief Complaint  Patient presents with  . Follow-up    Sinus draniage and more cough    HPI Kenzey Janicki 85 y.o. -returns for asthma follow-up.  However the issue today is that she reports worsening of sinus drainage for the last 1 month ever since the fall weather change.  He says the sinus drainage is so significant that it is making her cough.  She feels asthma itself is under control.  In fact exam nitric oxide today is 38.  She is continuing inhalers Singulair Nexium and injection dupilumab.  She refused flu shot because she is allergic to it.  Review of the chart indicates March 2018 Dr. Ashok Cordia did a CT scan of the sinus and it showed pansinusitis.  She is says she is seen ENT from 2 years ago and is unclear if it was before the scan or after the scan.  At this point in time she just wants relief from her current sinus issue.  The sinus drainage is possibly clear but she has not looked at it.  There is no fever worsening wheeze or chest tightness.  05/17/2018 Patient presents today with acute complaints of cough and congestion. Accompanied by husband. Productive cough with clear mucus since  05/05/18. Husband and brother were both sick. She was seen at [facility] on 12/29 and given steroid injection and amoxicillin course. Finished antibiotic 2 days ago. Did not notice much of an improvement, if any. She has taken mucinex and delsym for cough. Hasn't had to use rescue inhaler or nebulizer. Due for Dupixent injection on 05/25/18. Did not receive flu injection d/t allergy. Afebrile.   05/25/2018 Patient presents for 1 week follow-up for asthma exacerbation. CXR showed no acute cardiopulmonary disease. Treated with prednisone taper. Due for dupixent injection today. Feeling better than she did last week, still has a cough but sputum has cleared. Felt better yesterday, rain made her symptoms worse. States anytime the weather changes her asthma flares. Using nebulizer as needed, didn't use it as much yesterday. Required a treatment this morning. Takes Singulair and allegra. Afebrile.       OV 06/08/2018  Subjective:  Patient ID: [name], female , DOB: 01-26-1933 , age 39 y.o. , MRN: [id number] , ADDRESS: [place] [address]   06/08/2018 -   Chief Complaint  Patient presents with  . Follow-up    Pt states she has had sinus problems, cough with clear mucus, and has also had some chest pain/tightness. Pt has been seen by Derl Barrow for asthma exacerbations a couple different times. Pt denies any fever.   Severe asthma on Dupixent.  Also with chronic sinusitis  HPI Deseri Parisien 85 y.o. -returns for follow-up of her issues.  I last saw her in October 2019.  After that earlier this month she came to see nurse practitioner 2 times for acute on chronic sinus issues.  She tells me that she is tolerating her Dupixent injections just fine.  She feels it is helping her.  However, she is bothered by sinus issues this constantly sinus drainage particularly flared up in January 2020.  She recollects seeing Dr. Redmond Baseman in ENT.  At the time we recommended amoxicillin but she was  allergic to it and no antibiotic therefore was prescribed according to history.  Since then she has seen an allergist in her local area and is been cleared to get amoxicillin.  She is okay seeing her ENT but she wants to change to  her husband's ENT surgeon Dr. Elgie Congo.  Early this month with a sinus flush she got antibiotic and prednisone this helped her.  However she still has significant amount of residual symptoms.  Asthma control question is 2.8 but the nitric oxide level is normal.  This was excessive symptom burden.  She is waking up several times at night.  When she wakes up she has moderate symptoms she is moderately limited in her activities.  Moderate amount of shortness of breath and wheezing a little of the time and using albuterol for rescue at least 1 time daily.  She says is unimproved symptomatology because of the recent antibiotic and prednisone.  She feels she needs another course of antibiotic and prednisone as well.  In addition with the cough she is complaining of musculoskeletal pain in the upper chest and the back.  She feels the lungs are hurting.  There is no radiation.  The course is improving.  Of note, her past medical history lists her as having rheumatoid arthritis but she denies that to be the case.  She says she has osteoarthritis.  She does not see a rheumatologist.  She has had no prior immune work-up.     Asthma Control Panel  10/11/15 - IgE - 425 , rest of blood allergy panel negative. Blood eos 300cells (al;so 300 on 12/19/16). CT Sinus feb 2018 - chronic pan sinusitis     07/02/2016 06/15/2017  08/13/2017  12/01/2017  06/08/2018   Current Med Regimen  Spriva, symbicort,  Singulair, xolair, nexium same Spiriva, Symbicort, Singulair, Nexium and dupulimab Spiriva, Symbicort, Singulair, Nexium and dupulimab  ACQ 5 point- 1 week. wtd avg score. <1.0 is good control 0.75-1.25 is grey zone. >1.25 poor control. Delta 0.5 is clinically meaningful   2.4 0.6 2.9  FeNO ppB  126 ppb  104 ppb 43 19  FeV1  1.777/90%, ratio 74,       Planned intervention  for visit   HRCT, talk to eye doc and if ok start dupixent, augmenting, prednsione,       ROS - per HPI     has a past medical history of Asthma, severe persistent, Chronic kidney disease, stage II (mild), DDD (degenerative disc disease), cervical, Diverticulosis (yrs ago), Edema, lower extremity, GERD (gastroesophageal reflux disease), Heart murmur, History of breast cancer (1990 left mastectomy also), History of DVT of lower extremity (5 yrs ago), History of shingles, Hyperlipidemia, Leg ulcer, left ([place]), Osteoporosis, unspecified, Perennial allergic rhinitis, Restless legs syndrome (RLS), Rheumatoid arthritis ([place]), Thyroid nodule, and Unspecified essential hypertension.   reports that she has never smoked. She has never used smokeless tobacco.  Past Surgical History:  Procedure Laterality Date  . CARDIAC CATHETERIZATION  08/ 01/ 2008   dr Cathie Olden   normal -- EF of 65%  . CARDIOVASCULAR STRESS TEST  05-22-2011   dr Cathie Olden   normal lexiscan perfusion study/  no ischemia/  lvsf  86%  . CATARACT EXTRACTION W/ INTRAOCULAR LENS  IMPLANT, BILATERAL    . CHOLECYSTECTOMY  1993   laparoscopic  . CONVERSION TO TOTAL HIP Left 12/03/2017   Procedure: CONVERSION TO LEFT TOTAL HIP;  Surgeon: Paralee Cancel, MD;  Location: [facility];  Service: Orthopedics;  Laterality: Left;  90 mins  . HIP ARTHROPLASTY Left 12/22/2016   Procedure: HEMIARTHROPLASTY, LEFT;  Surgeon: Paralee Cancel, MD;  Location: [facility];  Service: Orthopedics;  Laterality: Left;  . INCISION AND DRAINAGE OF WOUND Left 10/24/2013   Procedure: IRRIGATION AND DEBRIDEMENT OF LEFT LEG  WITH PLACEMENT OF ACELL AND WOUND VAC;  Surgeon: Theodoro Kos, DO;  Location: [facility];  Service: Plastics;  Laterality: Left;  . [place]  . MASTECTOMY Bilateral rigth 1989///   left  1990   breast cancer 1989//   fibrocytic disease 1990  . TOTAL  ABDOMINAL HYSTERECTOMY W/ BILATERAL SALPINGOOPHORECTOMY  1989   done at same time as right mastectomy  . TOTAL KNEE ARTHROPLASTY Right 10/05/2012   Procedure: RIGHT TOTAL KNEE ARTHROPLASTY;  Surgeon: Mauri Pole, MD;  Location: [facility];  Service: Orthopedics;  Laterality: Right;    Allergies  Allergen Reactions  . Gabapentin Nausea Only  . Eggs Or Egg-Derived Products     RAW EGGS.. Can eat cooked eggs  . Influenza Vaccines     Egg allergy   . Nucala [Mepolizumab]   . Cephalosporins Rash  . Codeine Rash  . Levofloxacin Rash  . Pneumococcal Vaccines Rash    Immunization History  Administered Date(s) Administered  . Pneumococcal Conjugate-13 09/14/2012  . Pneumococcal Polysaccharide-23 01/31/2009  . Zoster Recombinat (Shingrix) 04/18/2017, 09/16/2017    Family History  Problem Relation Age of Onset  . Heart attack Mother   . Asthma Mother   . Heart disease Mother   . Hyperlipidemia Mother   . Heart attack Father   . Heart failure Father   . Deep vein thrombosis Father   . Heart disease Father   . Peripheral vascular disease Father        amputation  . Cancer Brother   . Asthma Brother   . Coronary artery disease Sister   . Heart disease Sister      Current Outpatient Medications:  .  acetaminophen (TYLENOL) 500 MG tablet, Take 2 tablets (1,000 mg total) by mouth every 8 (eight) hours., Disp: 30 tablet, Rfl: 0 .  albuterol (PROAIR HFA) 108 (90 Base) MCG/ACT inhaler, Inhale 2 puffs into the lungs every 6 (six) hours as needed for wheezing or shortness of breath., Disp: 18 g, Rfl: 5 .  albuterol (PROVENTIL) (2.5 MG/3ML) 0.083% nebulizer solution, Take 3 mLs (2.5 mg total) by nebulization every 6 (six) hours as needed for wheezing or shortness of breath. Dx: J45.41, Disp: 360 mL, Rfl: 0 .  benzonatate (TESSALON) 100 MG capsule, Take 1 capsule (100 mg total) by mouth 3 (three) times daily as needed for cough., Disp: 100 capsule, Rfl: 1 .  Calcium Carbonate-Vitamin D  (CALTRATE 600+D) 600-400 MG-UNIT per tablet, Take 1 tablet by mouth daily. , Disp: , Rfl:  .  chlorpheniramine (CHLOR-TRIMETON) 4 MG tablet, Take by mouth. 4 mg in the morning and 8 mg at bedtime, Disp: , Rfl:  .  Cholecalciferol (VITAMIN D3) 1000 UNITS tablet, Take 1,000 Units by mouth every evening. , Disp: , Rfl:  .  dextromethorphan (DELSYM) 30 MG/5ML liquid, Take 60 mg by mouth 2 (two) times daily as needed for cough., Disp: , Rfl:  .  dextromethorphan-guaiFENesin (MUCINEX DM) 30-600 MG 12hr tablet, Take 1 tablet by mouth daily as needed for cough., Disp: , Rfl:  .  docusate sodium (COLACE) 100 MG capsule, Take 1 capsule (100 mg total) by mouth 2 (two) times daily., Disp: 10 capsule, Rfl: 0 .  Dupilumab (DUPIXENT) 300 MG/2ML SOSY, Inject 300 mg into the skin every 14 (fourteen) days., Disp: 2 Syringe, Rfl: 6 .  fexofenadine (ALLEGRA) 180 MG tablet, Take 180 mg by mouth daily.  , Disp: , Rfl:  .  furosemide (  LASIX) 20 MG tablet, Take 20 mg by mouth daily., Disp: , Rfl:  .  ibandronate (BONIVA) 150 MG tablet, Take 1 tablet by mouth every 30 (thirty) days. , Disp: , Rfl: 3 .  mometasone (NASONEX) 50 MCG/ACT nasal spray, Place 2 sprays into the nose daily., Disp: 51 g, Rfl: 11 .  montelukast (SINGULAIR) 10 MG tablet, TAKE 1 TABLET BY MOUTH EVERYDAY AT BEDTIME, Disp: 90 tablet, Rfl: 1 .  Multiple Vitamin (MULTIVITAMIN) tablet, Take 1 tablet by mouth daily., Disp: , Rfl:  .  NEXIUM 40 MG capsule, TAKE 1 CAPSULE (40 MG TOTAL) BY MOUTH 2 (TWO) TIMES DAILY. NAME BRAND ONLY, Disp: 180 capsule, Rfl: 3 .  potassium chloride (K-DUR) 10 MEQ tablet, TAKE 1 TABLET BY MOUTH EVERY DAY, Disp: 90 tablet, Rfl: 3 .  pramipexole (MIRAPEX) 0.125 MG tablet, Take 0.375 mg by mouth at bedtime. , Disp: , Rfl:  .  rosuvastatin (CRESTOR) 5 MG tablet, TAKE 1 TABLET BY MOUTH EVERYDAY AT BEDTIME, Disp: 90 tablet, Rfl: 3 .  Spacer/Aero-Holding Chambers (AEROCHAMBER MV) inhaler, by Other route. Use as instructed , Disp: , Rfl:    .  SYMBICORT 160-4.5 MCG/ACT inhaler, TAKE 2 PUFFS BY MOUTH TWICE A DAY, Disp: 30.6 Inhaler, Rfl: 1 .  telmisartan (MICARDIS) 20 MG tablet, TAKE 1 TABLET BY MOUTH EVERYDAY AT BEDTIME, Disp: 90 tablet, Rfl: 3 .  Tiotropium Bromide Monohydrate (SPIRIVA RESPIMAT) 2.5 MCG/ACT AERS, Inhale 2 puffs into the lungs daily., Disp: 3 Inhaler, Rfl: 3 .  traMADol (ULTRAM) 50 MG tablet, Take 1 tablet (50 mg total) by mouth every 6 (six) hours as needed., Disp: 20 tablet, Rfl: 0 .  vitamin B-12 (CYANOCOBALAMIN) 500 MCG tablet, Take 500 mcg by mouth daily.  , Disp: , Rfl:  .  XARELTO 20 MG TABS tablet, Take 20 mg by mouth daily with supper. , Disp: , Rfl:   Current Facility-Administered Medications:  .  omalizumab (XOLAIR) injection 300 mg, 300 mg, Subcutaneous, Q14 Days, Sriansh Farra, MD, 300 mg at 07/27/17 1013 .  omalizumab Arvid Right) injection 300 mg, 300 mg, Subcutaneous, Q14 Days, Tanna Loeffler, MD, 300 mg at 08/13/17 0815 .  omalizumab Arvid Right) injection 300 mg, 300 mg, Subcutaneous, Q14 Days, Calvyn Kurtzman, Belva Crome, MD, 300 mg at 08/27/17 0950      Objective:   Vitals:   06/08/18 0928  BP: (!) 152/72  Pulse: 94  SpO2: 98%  Weight: 157 lb 12.8 oz (71.6 kg)  Height: '5\' 4"'  (1.626 m)    Estimated body mass index is 27.09 kg/m as calculated from the following:   Height as of this encounter: '5\' 4"'  (1.626 m).   Weight as of this encounter: 157 lb 12.8 oz (71.6 kg).  '@WEIGHTCHANGE' @  [employer]   06/08/18 0928  Weight: 157 lb 12.8 oz (71.6 kg)     Physical Exam  General Appearance:    Alert, cooperative, no distress, appears stated age - yes , Deconditioned looking - no , OBESE  - no, Sitting on Wheelchair -  no  Head:    Normocephalic, without obvious abnormality, atraumatic  Eyes:    PERRL, conjunctiva/corneas clear,  Ears:    Normal TM's and external ear canals, both ears  Nose:   Nares normal, septum midline, mucosa normal, PND  +   or sinus tenderness. OXYGEN ON  - no . Patient  is @ ra   Throat:   Lips, mucosa, and tongue normal; teeth and gums normal. Cyanosis on lips - no  Neck:  Supple, symmetrical, trachea midline, no adenopathy;    thyroid:  no enlargement/tenderness/nodules; no carotid   bruit or JVD  Back:     Symmetric, no curvature, ROM normal, no CVA tenderness  Lungs:     Distress - no , Wheeze no, Barrell Chest - no, Purse lip breathing - no, Crackles - no   Chest Wall:    No tenderness or deformity.    Heart:    Regular rate and rhythm, S1 and S2 normal, no rub   or gallop, Murmur - no  Breast Exam:    NOT DONE  Abdomen:     Soft, non-tender, bowel sounds active all four quadrants,    no masses, no organomegaly. Visceral obesity - no  Genitalia:   NOT DONE  Rectal:   NOT DONE  Extremities:   Extremities - normal, Has Cane - no, Clubbing - no, Edema - no  Pulses:   2+ and symmetric all extremities  Skin:   Stigmata of Connective Tissue Disease - no  Lymph nodes:   Cervical, supraclavicular, and axillary nodes normal  Psychiatric:  Neurologic:   Pleasant - yes, Anxious - no, Flat affect - no  CAm-ICU - neg, Alert and Oriented x 3 - yes, Moves all 4s - yes, Speech - normal, Cognition - intact           Assessment:       ICD-10-CM   1. Severe persistent asthma without complication G90.21   2. Chronic panethmoidal sinusitis J32.2   3. Musculoskeletal pain M79.18        Plan:     Patient Instructions  Severe persistent asthma without complication  - continue regular medicines and dupixent injections  - asthma is under control per nitric oxide testing  - excess symptom burden likely sinus driven  Chronic panethmoidal sinusitis - refer ENT Dr Elgie Congo (specifically) - rechallenge prednisone   - Take prednisone 40 mg daily x 2 days, then 27m daily x 2 days, then 176mdaily x 2 days, then 78m59maily x 2 days and stop - rechallenge antibiotic  - Take doxycycline 100m41m twice daily x 8 days; take after meals and avoid sunlight - check  IgG, igM, igA, ANCA screen, PR-3 and MPO antibodies, ANA, ESR  Musculoskeletal pain in back upper checst  - monitor with response to above  Followup  6 weeks or sooner if needed       SIGNATURE    Dr. MuraBrand MalesD., F.C.C.P,  Pulmonary and Critical Care Medicine Staff Physician, [facility]  [facility]  [place], [id number]: [phone] no answer or between  15:00h - 7:00h: call [phone] Telephone: [phone]  9:56 AM 06/08/2018

## 2018-06-08 NOTE — Addendum Note (Signed)
Addended by: Lorretta Harp on: 06/08/2018 10:10 AM   Modules accepted: Orders

## 2018-06-08 NOTE — Patient Instructions (Addendum)
Severe persistent asthma without complication  - continue regular medicines and dupixent injections  - asthma is under control per nitric oxide testing  - excess symptom burden likely sinus driven  Chronic panethmoidal sinusitis - refer ENT Dr Elgie Congo (specifically) - rechallenge prednisone   - Take prednisone 40 mg daily x 2 days, then 62m daily x 2 days, then 111mdaily x 2 days, then 21m77maily x 2 days and stop - rechallenge antibiotic  - Take doxycycline 100m73m twice daily x 8 days; take after meals and avoid sunlight - check IgG, igM, igA, ANCA screen, PR-3 and MPO antibodies, ANA, ESR  Musculoskeletal pain in back upper checst  - monitor with response to above  Followup  6 weeks or sooner if needed

## 2018-06-08 NOTE — Addendum Note (Signed)
Addended by: Joella Prince on: 06/08/2018 10:07 AM   Modules accepted: Orders

## 2018-06-15 LAB — ANCA SCREEN W REFLEX TITER
ANCA Screen: POSITIVE — AB
C-ANCA Titer: 1:20 {titer} — ABNORMAL HIGH

## 2018-06-15 LAB — IGM: IgM, Serum: 104 mg/dL (ref 50–300)

## 2018-06-15 LAB — ANTI-NUCLEAR AB-TITER (ANA TITER)

## 2018-06-15 LAB — MPO/PR-3 (ANCA) ANTIBODIES: Myeloperoxidase Abs: <1 AI

## 2018-06-15 LAB — IGG: IgG (Immunoglobin G), Serum: 899 mg/dL (ref 600–1540)

## 2018-06-15 LAB — ANA: Anti Nuclear Antibody(ANA): POSITIVE — AB

## 2018-06-21 ENCOUNTER — Telehealth: Payer: Self-pay | Admitting: Internal Medicine

## 2018-06-21 DIAGNOSIS — H18832 Recurrent erosion of cornea, left eye: Secondary | ICD-10-CM | POA: Diagnosis not present

## 2018-06-22 ENCOUNTER — Ambulatory Visit (INDEPENDENT_AMBULATORY_CARE_PROVIDER_SITE_OTHER): Payer: Medicare Other

## 2018-06-22 DIAGNOSIS — J4550 Severe persistent asthma, uncomplicated: Secondary | ICD-10-CM | POA: Diagnosis not present

## 2018-06-29 MED ORDER — DUPILUMAB 300 MG/2ML ~~LOC~~ SOSY
300.0000 mg | PREFILLED_SYRINGE | Freq: Once | SUBCUTANEOUS | Status: AC
  Administered 2018-06-22: 300 mg via SUBCUTANEOUS

## 2018-06-29 NOTE — Progress Notes (Signed)
Dupixent injection documentation and charges entered by Maury Dus, RMA, based on injection sheet filled out by [facility] during preparation and administration. This documentation process is due to office requirements.

## 2018-06-30 ENCOUNTER — Telehealth: Payer: Self-pay | Admitting: Internal Medicine

## 2018-06-30 NOTE — Telephone Encounter (Signed)
1 prefilled syringe Arrival Date: 06/21/2018 Lot #:[id number] Exp date: 03/2020 Pt. Ordered, came in while I was off the other wk.[name]

## 2018-06-30 NOTE — Telephone Encounter (Signed)
ERROR

## 2018-07-06 ENCOUNTER — Ambulatory Visit (INDEPENDENT_AMBULATORY_CARE_PROVIDER_SITE_OTHER): Payer: Medicare Other

## 2018-07-06 DIAGNOSIS — J4550 Severe persistent asthma, uncomplicated: Secondary | ICD-10-CM | POA: Diagnosis not present

## 2018-07-06 MED ORDER — DUPILUMAB 300 MG/2ML ~~LOC~~ SOSY
300.0000 mg | PREFILLED_SYRINGE | Freq: Once | SUBCUTANEOUS | Status: AC
  Administered 2018-07-06: 300 mg via SUBCUTANEOUS

## 2018-07-13 ENCOUNTER — Telehealth: Payer: Self-pay | Admitting: Internal Medicine

## 2018-07-13 NOTE — Telephone Encounter (Signed)
1 prefilled syringe Arrival Date: 07/13/2018 Lot #:[id number] Exp date: 04/2022

## 2018-07-19 DIAGNOSIS — Z1212 Encounter for screening for malignant neoplasm of rectum: Secondary | ICD-10-CM | POA: Diagnosis not present

## 2018-07-19 DIAGNOSIS — Z1211 Encounter for screening for malignant neoplasm of colon: Secondary | ICD-10-CM | POA: Diagnosis not present

## 2018-07-20 ENCOUNTER — Ambulatory Visit (INDEPENDENT_AMBULATORY_CARE_PROVIDER_SITE_OTHER): Payer: Medicare Other

## 2018-07-20 DIAGNOSIS — J4550 Severe persistent asthma, uncomplicated: Secondary | ICD-10-CM | POA: Diagnosis not present

## 2018-07-20 MED ORDER — DUPILUMAB 300 MG/2ML ~~LOC~~ SOSY
300.0000 mg | PREFILLED_SYRINGE | Freq: Once | SUBCUTANEOUS | Status: AC
  Administered 2018-07-20: 300 mg via SUBCUTANEOUS

## 2018-07-21 DIAGNOSIS — R0982 Postnasal drip: Secondary | ICD-10-CM | POA: Diagnosis not present

## 2018-07-21 DIAGNOSIS — J310 Chronic rhinitis: Secondary | ICD-10-CM | POA: Diagnosis not present

## 2018-07-21 DIAGNOSIS — R05 Cough: Secondary | ICD-10-CM | POA: Diagnosis not present

## 2018-07-27 ENCOUNTER — Other Ambulatory Visit: Payer: Self-pay

## 2018-07-27 ENCOUNTER — Ambulatory Visit (INDEPENDENT_AMBULATORY_CARE_PROVIDER_SITE_OTHER): Payer: Medicare Other | Admitting: Nurse Practitioner

## 2018-07-27 ENCOUNTER — Encounter: Payer: Self-pay | Admitting: Internal Medicine

## 2018-07-27 VITALS — BP 132/80 | HR 86 | Ht 64.0 in | Wt 161.2 lb

## 2018-07-27 DIAGNOSIS — J4550 Severe persistent asthma, uncomplicated: Secondary | ICD-10-CM | POA: Diagnosis not present

## 2018-07-27 LAB — NITRIC OXIDE: Nitric Oxide: 24

## 2018-07-27 NOTE — Progress Notes (Signed)
@[name], female    DOB: 1932/09/10, 85 y.o.   MRN: [id number]  Chief Complaint  Patient presents with  . Follow-up    Referring provider: Crist Infante, MD  HPI 85 year old female, never smoked.  Past medical history significant for severe persistent asthma with eosinophilia, chronic sinusitis, DVT (on Xarelto), GERD, chronic diastolic heart failure, hypertension, lung nodule.  Patient Dr. Chase Caller, last seen 06/08/2018. Maintained on Dupixent 300mg  injections q 14days, Symbicort 160 BID, Spiriva, singulair, allegra and prn albuterol.  Tests/recent events: 05/17/2018 Patient presents today with acute complaints of cough and congestion. Accompanied by husband. Productive cough with clear mucus since 05/05/18. Husband and brother were both sick. She was seen at [facility] on 12/29 and given steroid injection and amoxicillin course. Finished antibiotic 2 days ago. Did not notice much of an improvement, if any. She has taken mucinex and delsym for cough. Hasn't had to use rescue inhaler or nebulizer. Due for Dupixent injection on 05/25/18. Did not receive flu injection d/t allergy. Afebrile.   05/25/2018 Patient presents for 1 week follow-up for asthma exacerbation. CXR showed no acute cardiopulmonary disease. Treated with prednisone taper. Due for dupixent injection today. Feeling better than she did last week, still has a cough but sputum has cleared. Felt better yesterday, rain made her symptoms worse. States anytime the weather changes her asthma flares. Using nebulizer as needed, didn't use it as much yesterday. Required a treatment this morning. Takes Singulair and allegra. Afebrile.   06/08/18 - Last OV with Dr. Chase Caller Patient has been seen twice in our office since her last appointment with me. Last visit was on 9/26 with nurse practitioner. Patient was treated for an exacerbation of her asthma with bronchitis. She was given a prednisone taper, instructed to use Mucinex twice  daily as needed, and a 7 day course of Augmentin. Since her last appointment she fractured her left hip. She is continuing to walk with a cane. She reports no adverse effects from her Xolair. She does feel the Xolair is helping her significantly. Hasn't needed her rescue inhaler significantly in the last few weeks. Only rare nocturnal awakenings with any dyspnea.   Severe, persistent asthma: Patient currently maintained on a regimen of Xolair, Singulair, Spiriva, and Symbicort. She feels her dyspnea has significantly improved. She still has a scratchy throat & hoarse voice. She has her baseline nonproductive cough. No wheezing.  Chronic allergic rhinitis: Previously referred to ENT. Regimen at last appointment with me included Allegra, Singulair, Nasonex, and Xolair. Patient has had multiple different antibiotic courses for recurrent sinusitis. Only having intermittent sinus congestion & drainage.   DVT: Present in bilateral lower extremities. Patient on systemic anticoagulation with Xarelto indefinitely. No melena or hematochezia. No hematuria.   GERD with hiatal hernia: Previously prescribed Nexium. No reflux or dyspepsia. No morning brash water taste.     Lab Results  Component Value Date   NITRICOXIDE 24 07/27/2018   Dupilumab SOSY 300 mg    Date Action Dose Route User   06/08/2018 1146 Given 300 mg Subcutaneous (Right Arm) Scott, Tammy B    Dupilumab SOSY 300 mg    Date Action Dose Route User   06/22/2018 1514 Given 300 mg Subcutaneous (Left Arm) Len Blalock, CMA    Dupilumab SOSY 300 mg    Date Action Dose Route User   07/06/2018 1205 Given 300 mg Subcutaneous (Right Arm) Scott, Tammy B    Dupilumab SOSY 300 mg    Date Action Dose Route  User   07/20/2018 1016 Given 300 mg Subcutaneous (Right Arm) Scott, Tammy B      OV 07/27/18 - Follow up Patient presents today for a follow-up.  She states that she has been doing well over the last few weeks.  She is tolerating her  Dupixent injections well.  She did see Dr. Elgie Congo with ENT as advised by Dr. Chase Caller at last visit.  She was started on Atrovent nasal spray and gabapentin at night and states that this has helped her cough.  She has been compliant with Symbicort, Spiriva, and Proventil as needed.  She continues Allegra and Singulair.  Denies any recent fevers.  Overall she states that she has been doing well over the last few weeks but is concerned that when her Symbicort is up for renewal that she may not be able to afford it due to insurance.  She will let us know if it is too expensive so that we can adjust as needed.  Denies f/c/s, n/v/d, hemoptysis, PND, leg swelling.  Severe, persistent asthma: Patient currently maintained on a regimen of Xolair, Singulair, Spiriva, and Symbicort. She feels her dyspnea has significantly improved. No wheezing.  Chronic allergic rhinitis: Has been seen by ENT. Regimen includes Allegra, Singulair, Atrovent nasal spray, and dupixent . Patient states that she is better.   DVT: Present in bilateral lower extremities. Patient on systemic anticoagulation with Xarelto indefinitely. No melena or hematochezia. No hematuria.   GERD with hiatal hernia: Previously prescribed Nexium. No reflux or dyspepsia. No morning brash water taste.    Allergies  Allergen Reactions  . Gabapentin Nausea Only  . Eggs Or Egg-Derived Products     RAW EGGS.. Can eat cooked eggs  . Influenza Vaccines     Egg allergy   . Nucala [Mepolizumab]   . Cephalosporins Rash  . Codeine Rash  . Levofloxacin Rash  . Pneumococcal Vaccines Rash    Immunization History  Administered Date(s) Administered  . Pneumococcal Conjugate-13 09/14/2012  . Pneumococcal Polysaccharide-23 01/31/2009  . Zoster Recombinat (Shingrix) 04/18/2017, 09/16/2017    Past Medical History:  Diagnosis Date  . Asthma, severe persistent    pulmologist-- dr Lynford Citizen  . Chronic kidney disease, stage II (mild)   . DDD  (degenerative disc disease), cervical   . Diverticulosis yrs ago   hx of   . Edema, lower extremity    occ both legs swell  . GERD (gastroesophageal reflux disease)   . Heart murmur   . History of breast cancer 1990 left mastectomy also   1989  S/P   RIGHT MASTECTOMY ;  NO CHEMORADIATION //   NO RECURRENCE  . History of DVT of lower extremity 5 yrs ago   right leg  . History of shingles   . Hyperlipidemia   . Leg ulcer, left ([place])   . Osteoporosis, unspecified    Knee and hip osteoarthritis bilaterally  . Perennial allergic rhinitis   . Restless legs syndrome (RLS)   . Rheumatoid arthritis ([place])    hands  . Thyroid nodule    followed by dr perrini yearly, no current problam  . Unspecified essential hypertension     Tobacco History: Social History   Tobacco Use  Smoking Status Never Smoker  Smokeless Tobacco Never Used   Counseling given: Yes   Outpatient Encounter Medications as of 07/27/2018  Medication Sig  . acetaminophen (TYLENOL) 500 MG tablet Take 2 tablets (1,000 mg total) by mouth every 8 (eight) hours.  [name] albuterol (PROAIR HFA)  108 (90 Base) MCG/ACT inhaler Inhale 2 puffs into the lungs every 6 (six) hours as needed for wheezing or shortness of breath.  [name] albuterol (PROVENTIL) (2.5 MG/3ML) 0.083% nebulizer solution Take 3 mLs (2.5 mg total) by nebulization every 6 (six) hours as needed for wheezing or shortness of breath. Dx: J45.41  . benzonatate (TESSALON) 100 MG capsule Take 1 capsule (100 mg total) by mouth 3 (three) times daily as needed for cough.  . Calcium Carbonate-Vitamin D (CALTRATE 600+D) 600-400 MG-UNIT per tablet Take 1 tablet by mouth daily.   . chlorpheniramine (CHLOR-TRIMETON) 4 MG tablet Take by mouth. 4 mg in the morning and 8 mg at bedtime  . Cholecalciferol (VITAMIN D3) 1000 UNITS tablet Take 1,000 Units by mouth every evening.   [name] dextromethorphan (DELSYM) 30 MG/5ML liquid Take 60 mg by mouth 2 (two) times daily as needed for cough.  .  dextromethorphan-guaiFENesin (MUCINEX DM) 30-600 MG 12hr tablet Take 1 tablet by mouth daily as needed for cough.  . docusate sodium (COLACE) 100 MG capsule Take 1 capsule (100 mg total) by mouth 2 (two) times daily.  [name] doxycycline (VIBRA-TABS) 100 MG tablet Take 1 tablet (100 mg total) by mouth 2 (two) times daily.  . Dupilumab (DUPIXENT) 300 MG/2ML SOSY Inject 300 mg into the skin every 14 (fourteen) days.  . fexofenadine (ALLEGRA) 180 MG tablet Take 180 mg by mouth daily.    . furosemide (LASIX) 20 MG tablet Take 20 mg by mouth daily.  [name] ibandronate (BONIVA) 150 MG tablet Take 1 tablet by mouth every 30 (thirty) days.   . mometasone (NASONEX) 50 MCG/ACT nasal spray Place 2 sprays into the nose daily.  . montelukast (SINGULAIR) 10 MG tablet TAKE 1 TABLET BY MOUTH EVERYDAY AT BEDTIME  . Multiple Vitamin (MULTIVITAMIN) tablet Take 1 tablet by mouth daily.  [name] NEXIUM 40 MG capsule TAKE 1 CAPSULE (40 MG TOTAL) BY MOUTH 2 (TWO) TIMES DAILY. NAME BRAND ONLY  . potassium chloride (K-DUR) 10 MEQ tablet TAKE 1 TABLET BY MOUTH EVERY DAY  . pramipexole (MIRAPEX) 0.125 MG tablet Take 0.375 mg by mouth at bedtime.   . rosuvastatin (CRESTOR) 5 MG tablet TAKE 1 TABLET BY MOUTH EVERYDAY AT BEDTIME  . Spacer/Aero-Holding Chambers (AEROCHAMBER MV) inhaler by Other route. Use as instructed   . SYMBICORT 160-4.5 MCG/ACT inhaler TAKE 2 PUFFS BY MOUTH TWICE A DAY  . telmisartan (MICARDIS) 20 MG tablet TAKE 1 TABLET BY MOUTH EVERYDAY AT BEDTIME  . Tiotropium Bromide Monohydrate (SPIRIVA RESPIMAT) 2.5 MCG/ACT AERS Inhale 2 puffs into the lungs daily.  . traMADol (ULTRAM) 50 MG tablet Take 1 tablet (50 mg total) by mouth every 6 (six) hours as needed.  . vitamin B-12 (CYANOCOBALAMIN) 500 MCG tablet Take 500 mcg by mouth daily.    [name] Reasons 20 MG TABS tablet Take 20 mg by mouth daily with supper.   . predniSONE (DELTASONE) 10 MG tablet Take 4tabsx2days,2tabsx2days,1tabx2days,0.5tabx2days,then stop (Patient not taking:  Reported on 07/27/2018)   Facility-Administered Encounter Medications as of 07/27/2018  Medication  . omalizumab Arvid Right) injection 300 mg  . omalizumab Arvid Right) injection 300 mg  . omalizumab Arvid Right) injection 300 mg     Review of Systems  Review of Systems  Constitutional: Negative.  Negative for chills and fever.  HENT: Negative.   Respiratory: Negative for cough, shortness of breath, wheezing and stridor.   Cardiovascular: Negative.  Negative for chest pain, palpitations and leg swelling.  Gastrointestinal: Negative.   Allergic/Immunologic: Negative.   Neurological: Negative.  Psychiatric/Behavioral: Negative.        Physical Exam  BP 132/80 (BP Location: Left Arm, Cuff Size: Normal)   Pulse 86   Ht 5\' 4"  (1.626 m)   Wt 161 lb 3.2 oz (73.1 kg)   SpO2 97%   BMI 27.67 kg/m   Wt Readings from Last 5 Encounters:  07/27/18 161 lb 3.2 oz (73.1 kg)  06/08/18 157 lb 12.8 oz (71.6 kg)  05/25/18 158 lb 9.6 oz (71.9 kg)  05/17/18 155 lb 12.8 oz (70.7 kg)  03/19/18 158 lb (71.7 kg)     Physical Exam Vitals signs and nursing note reviewed.  Constitutional:      General: She is not in acute distress.    Appearance: She is well-developed.  Cardiovascular:     Rate and Rhythm: Normal rate and regular rhythm.  Pulmonary:     Effort: Pulmonary effort is normal. No respiratory distress.     Breath sounds: Normal breath sounds. No wheezing or rhonchi.  Musculoskeletal:        General: No swelling.  Neurological:     Mental Status: She is alert and oriented to person, place, and time.       Assessment & Plan:   Severe persistent asthma Patient presents today for a follow-up.  She states that she has been doing well over the last few weeks.  She is tolerating her Dupixent injections well.  She did see Dr. Elgie Congo with ENT as advised by Dr. Chase Caller at last visit.  She was started on Atrovent nasal spray and gabapentin at night and states that this has helped her cough.   She has been compliant with Symbicort, Spiriva, and Proventil as needed.  She continues Allegra and Singulair.  Denies any recent fevers.  Overall she states that she has been doing well over the last few weeks but is concerned that when her Symbicort is up for renewal that she may not be able to afford it due to insurance.  Patient Instructions  Glad that you have been doing well Severe persistent asthma without complication  - continue regular medicines and dupixent injections  - asthma is under control per nitric oxide testing  -  continue Gabapentin per ENT instructions - Continue nasonex  Follow up with Dr. Chase Caller in 2-3 months or sooner if needed       Fenton Foy, NP 07/27/2018

## 2018-07-27 NOTE — Assessment & Plan Note (Signed)
Patient presents today for a follow-up.  She states that she has been doing well over the last few weeks.  She is tolerating her Dupixent injections well.  She did see Dr. Elgie Congo with ENT as advised by Dr. Chase Caller at last visit.  She was started on Atrovent nasal spray and gabapentin at night and states that this has helped her cough.  She has been compliant with Symbicort, Spiriva, and Proventil as needed.  She continues Allegra and Singulair.  Denies any recent fevers.  Overall she states that she has been doing well over the last few weeks but is concerned that when her Symbicort is up for renewal that she may not be able to afford it due to insurance.  Patient Instructions  Glad that you have been doing well Severe persistent asthma without complication  - continue regular medicines and dupixent injections  - asthma is under control per nitric oxide testing  -  continue Gabapentin per ENT instructions - Continue nasonex  Follow up with Dr. Chase Caller in 2-3 months or sooner if needed

## 2018-07-27 NOTE — Patient Instructions (Addendum)
Glad that you have been doing well Severe persistent asthma without complication  - continue regular medicines and dupixent injections  - asthma is under control per nitric oxide testing  - continue Gabapentin per ENT instructions - Continue nasonex  Follow up with Dr. Chase Caller in 2-3 months or sooner if needed

## 2018-07-28 DIAGNOSIS — M810 Age-related osteoporosis without current pathological fracture: Secondary | ICD-10-CM | POA: Diagnosis not present

## 2018-07-28 DIAGNOSIS — C50919 Malignant neoplasm of unspecified site of unspecified female breast: Secondary | ICD-10-CM | POA: Diagnosis not present

## 2018-07-28 DIAGNOSIS — L0390 Cellulitis, unspecified: Secondary | ICD-10-CM | POA: Diagnosis not present

## 2018-07-28 DIAGNOSIS — Z6828 Body mass index (BMI) 28.0-28.9, adult: Secondary | ICD-10-CM | POA: Diagnosis not present

## 2018-07-28 DIAGNOSIS — I10 Essential (primary) hypertension: Secondary | ICD-10-CM | POA: Diagnosis not present

## 2018-07-28 DIAGNOSIS — D692 Other nonthrombocytopenic purpura: Secondary | ICD-10-CM | POA: Diagnosis not present

## 2018-07-28 DIAGNOSIS — J45998 Other asthma: Secondary | ICD-10-CM | POA: Diagnosis not present

## 2018-08-03 ENCOUNTER — Other Ambulatory Visit: Payer: Self-pay

## 2018-08-03 ENCOUNTER — Ambulatory Visit (INDEPENDENT_AMBULATORY_CARE_PROVIDER_SITE_OTHER): Payer: Medicare Other

## 2018-08-03 DIAGNOSIS — J4550 Severe persistent asthma, uncomplicated: Secondary | ICD-10-CM | POA: Diagnosis not present

## 2018-08-03 MED ORDER — DUPILUMAB 300 MG/2ML ~~LOC~~ SOSY
300.0000 mg | PREFILLED_SYRINGE | Freq: Once | SUBCUTANEOUS | Status: AC
  Administered 2018-08-03: 300 mg via SUBCUTANEOUS

## 2018-08-05 ENCOUNTER — Telehealth: Payer: Self-pay | Admitting: Internal Medicine

## 2018-08-05 NOTE — Telephone Encounter (Signed)
1 prefilled syringe Ordered Date: 08/05/2018 Shipping Date: 08/09/2018

## 2018-08-10 NOTE — Telephone Encounter (Signed)
1 prefilled syringe Arrival Date: 08/10/2018 Lot #:[id number] Exp date: 05/2020

## 2018-08-12 ENCOUNTER — Other Ambulatory Visit: Payer: Self-pay | Admitting: Internal Medicine

## 2018-08-17 ENCOUNTER — Ambulatory Visit (INDEPENDENT_AMBULATORY_CARE_PROVIDER_SITE_OTHER): Payer: Medicare Other

## 2018-08-17 ENCOUNTER — Other Ambulatory Visit: Payer: Self-pay

## 2018-08-17 DIAGNOSIS — J4550 Severe persistent asthma, uncomplicated: Secondary | ICD-10-CM | POA: Diagnosis not present

## 2018-08-17 MED ORDER — DUPILUMAB 300 MG/2ML ~~LOC~~ SOSY
300.0000 mg | PREFILLED_SYRINGE | Freq: Once | SUBCUTANEOUS | Status: AC
  Administered 2018-08-17: 300 mg via SUBCUTANEOUS

## 2018-08-17 NOTE — Progress Notes (Addendum)
1 prefilled syringe Ordered Date: 08/17/2018 Shipping Date: 08/18/2018  Order was discarded. Pt. Orders med..  Have you been hospitalized within the last 10 days?  No Do you have a fever?  No Do you have a cough?  Yes  Very little cough Do you have a headache or sore throat? No

## 2018-08-31 ENCOUNTER — Ambulatory Visit (INDEPENDENT_AMBULATORY_CARE_PROVIDER_SITE_OTHER): Payer: Medicare Other

## 2018-08-31 ENCOUNTER — Other Ambulatory Visit: Payer: Self-pay

## 2018-08-31 DIAGNOSIS — J4550 Severe persistent asthma, uncomplicated: Secondary | ICD-10-CM | POA: Diagnosis not present

## 2018-08-31 MED ORDER — DUPILUMAB 300 MG/2ML ~~LOC~~ SOSY
300.0000 mg | PREFILLED_SYRINGE | Freq: Once | SUBCUTANEOUS | Status: AC
  Administered 2018-08-31: 10:00:00 300 mg via SUBCUTANEOUS

## 2018-08-31 NOTE — Progress Notes (Signed)
Have you been hospitalized within the last 10 days?  No Do you have a fever?  No Do you have a cough?  Yes  Dry Do you have a headache or sore throat? No

## 2018-09-02 ENCOUNTER — Telehealth: Payer: Self-pay | Admitting: Internal Medicine

## 2018-09-02 NOTE — Telephone Encounter (Signed)
Dupixent Shipment Received: 300mg  #1 prefilled syringe Medication arrival date: 09/02/2018 Lot #: [id number] Medication exp date: 01/2021 Received by: TBS

## 2018-09-07 ENCOUNTER — Encounter (HOSPITAL_BASED_OUTPATIENT_CLINIC_OR_DEPARTMENT_OTHER): Payer: Self-pay | Admitting: *Deleted

## 2018-09-07 ENCOUNTER — Other Ambulatory Visit: Payer: Self-pay

## 2018-09-07 ENCOUNTER — Emergency Department (HOSPITAL_BASED_OUTPATIENT_CLINIC_OR_DEPARTMENT_OTHER): Payer: Medicare Other

## 2018-09-07 ENCOUNTER — Emergency Department (HOSPITAL_BASED_OUTPATIENT_CLINIC_OR_DEPARTMENT_OTHER)
Admission: EM | Admit: 2018-09-07 | Discharge: 2018-09-07 | Disposition: A | Discharge status: Home / Self Care | Payer: Medicare Other | Attending: Emergency Medicine | Admitting: Emergency Medicine

## 2018-09-07 DIAGNOSIS — Y939 Activity, unspecified: Secondary | ICD-10-CM | POA: Insufficient documentation

## 2018-09-07 DIAGNOSIS — I2510 Atherosclerotic heart disease of native coronary artery without angina pectoris: Secondary | ICD-10-CM | POA: Diagnosis not present

## 2018-09-07 DIAGNOSIS — M25531 Pain in right wrist: Secondary | ICD-10-CM | POA: Diagnosis not present

## 2018-09-07 DIAGNOSIS — S4991XA Unspecified injury of right shoulder and upper arm, initial encounter: Secondary | ICD-10-CM | POA: Diagnosis present

## 2018-09-07 DIAGNOSIS — Z79899 Other long term (current) drug therapy: Secondary | ICD-10-CM | POA: Diagnosis not present

## 2018-09-07 DIAGNOSIS — J45909 Unspecified asthma, uncomplicated: Secondary | ICD-10-CM | POA: Insufficient documentation

## 2018-09-07 DIAGNOSIS — Z23 Encounter for immunization: Secondary | ICD-10-CM | POA: Insufficient documentation

## 2018-09-07 DIAGNOSIS — S6991XA Unspecified injury of right wrist, hand and finger(s), initial encounter: Secondary | ICD-10-CM | POA: Diagnosis not present

## 2018-09-07 DIAGNOSIS — Y999 Unspecified external cause status: Secondary | ICD-10-CM | POA: Diagnosis not present

## 2018-09-07 DIAGNOSIS — W0110XA Fall on same level from slipping, tripping and stumbling with subsequent striking against unspecified object, initial encounter: Secondary | ICD-10-CM | POA: Diagnosis not present

## 2018-09-07 DIAGNOSIS — Z96642 Presence of left artificial hip joint: Secondary | ICD-10-CM | POA: Diagnosis not present

## 2018-09-07 DIAGNOSIS — Z853 Personal history of malignant neoplasm of breast: Secondary | ICD-10-CM | POA: Diagnosis not present

## 2018-09-07 DIAGNOSIS — Y92009 Unspecified place in unspecified non-institutional (private) residence as the place of occurrence of the external cause: Secondary | ICD-10-CM | POA: Diagnosis not present

## 2018-09-07 DIAGNOSIS — S79912A Unspecified injury of left hip, initial encounter: Secondary | ICD-10-CM | POA: Diagnosis not present

## 2018-09-07 DIAGNOSIS — Z7901 Long term (current) use of anticoagulants: Secondary | ICD-10-CM | POA: Insufficient documentation

## 2018-09-07 DIAGNOSIS — Z96651 Presence of right artificial knee joint: Secondary | ICD-10-CM | POA: Insufficient documentation

## 2018-09-07 DIAGNOSIS — S42251A Displaced fracture of greater tuberosity of right humerus, initial encounter for closed fracture: Secondary | ICD-10-CM | POA: Diagnosis not present

## 2018-09-07 DIAGNOSIS — N183 Chronic kidney disease, stage 3 (moderate): Secondary | ICD-10-CM | POA: Insufficient documentation

## 2018-09-07 DIAGNOSIS — I129 Hypertensive chronic kidney disease with stage 1 through stage 4 chronic kidney disease, or unspecified chronic kidney disease: Secondary | ICD-10-CM | POA: Diagnosis not present

## 2018-09-07 MED ORDER — MORPHINE SULFATE 15 MG PO TABS: 15.0000 mg | ORAL_TABLET | ORAL | 0 refills | Status: DC | PRN

## 2018-09-07 MED ORDER — TETANUS-DIPHTH-ACELL PERTUSSIS 5-2.5-18.5 LF-MCG/0.5 IM SUSP
0.5000 mL | Freq: Once | INTRAMUSCULAR | Status: AC
  Administered 2018-09-07: 0.5 mL via INTRAMUSCULAR
  Filled 2018-09-07: qty 0.5

## 2018-09-07 MED ORDER — TETANUS-DIPHTH-ACELL PERTUSSIS 5-2.5-18.5 LF-MCG/0.5 IM SUSP
INTRAMUSCULAR | Status: AC
  Filled 2018-09-07: qty 0.5

## 2018-09-07 MED ORDER — ACETAMINOPHEN 500 MG PO TABS
1000.0000 mg | ORAL_TABLET | Freq: Once | ORAL | Status: AC
  Administered 2018-09-07: 1000 mg via ORAL
  Filled 2018-09-07: qty 2

## 2018-09-07 NOTE — ED Notes (Signed)
Patient transported to X-ray 

## 2018-09-07 NOTE — ED Triage Notes (Signed)
Pt c/o fall from standing injuring right elbow and right wrist

## 2018-09-07 NOTE — Discharge Instructions (Addendum)
Follow up with your PCP and ortho.     Take tylenol 1000mg (2 extra strength) four times a day.   Then take the pain medicine if you feel like you need it. Narcotics do not help with the pain, they only make you care about it less.  You can become addicted to this, people may break into your house to steal it.  It will constipate you.  If you drive under the influence of this medicine you can get a DUI.    You need to take your arm out of the sling and perform range of motion exercises at least 4x a day.

## 2018-09-07 NOTE — ED Provider Notes (Signed)
[facility] EMERGENCY DEPARTMENT Provider Note   CSN: [id number] Arrival date & time: 09/07/18  1458    History   Chief Complaint Chief Complaint  Patient presents with   Arm Injury    HPI [name] is a 85 y.o. female.     85 yo F with a chief complaint of right shoulder pain.  The patient was walking and she bumped into an open cabinet and fell down onto her bottom on her right side.  She denies head injury denies loss consciousness denies neck pain or back pain.  Complaining mostly of right lateral arm pain.  Worse with movement and palpation.  She also has a skin tear to her right wrist.  No pain with movement of the wrist.  The history is provided by the patient.  Arm Injury  Location:  Shoulder Shoulder location:  R shoulder Injury: yes   Time since incident:  1 hour Mechanism of injury: fall   Fall:    Fall occurred:  [employer] of impact: shoulder.   Entrapped after fall: no   Pain details:    Quality:  Aching   Severity:  Moderate   Onset quality:  Gradual   Duration:  2 days   Timing:  Constant   Progression:  Worsening Handedness:  Right-handed Dislocation: no   Tetanus status:  Unknown Prior injury to area:  No Relieved by:  Nothing Worsened by:  Nothing Ineffective treatments:  None tried Associated symptoms: no fever     Past Medical History:  Diagnosis Date   Asthma, severe persistent    pulmologist-- dr Lynford Citizen   Chronic kidney disease, stage II (mild)    DDD (degenerative disc disease), cervical    Diverticulosis yrs ago   hx of    Edema, lower extremity    occ both legs swell   GERD (gastroesophageal reflux disease)    Heart murmur    History of breast cancer 1990 left mastectomy also   1989  S/P   RIGHT MASTECTOMY ;  NO CHEMORADIATION //   NO RECURRENCE   History of DVT of lower extremity 5 yrs ago   right leg   History of shingles    Hyperlipidemia    Leg ulcer, left (HCC)    Osteoporosis,  unspecified    Knee and hip osteoarthritis bilaterally   Perennial allergic rhinitis    Restless legs syndrome (RLS)    Rheumatoid arthritis (HCC)    hands   Thyroid nodule    followed by dr perrini yearly, no current problam   Unspecified essential hypertension     Patient Active Problem List   Diagnosis Date Noted   S/P left TH revision 12/03/2017   Essential hypertension 11/17/2017   Chronic diastolic CHF (congestive heart failure) ([place]) 03/19/2017   CAD (coronary artery disease), native coronary artery 12/20/2016   Closed left hip fracture ([place]) 12/19/2016   CKD (chronic kidney disease) stage 3, GFR 30-59 ml/min ([place]) 12/19/2016   History of DVT of lower extremity    Lung nodule 08/03/2014   Peripheral vascular disease, unspecified ([place]) 09/12/2013   Hyperlipidemia 03/03/2013   Expected blood loss anemia 10/06/2012   Overweight (BMI 25.0-29.9) 10/06/2012   DJD (degenerative joint disease) 05/26/2012   Severe persistent asthma 08/05/2007   Hypertensive heart disease without CHF 04/30/2007   GERD 01/13/2007    Past Surgical History:  Procedure Laterality Date   CARDIAC CATHETERIZATION  08/ 01/ 2008   dr Cathie Olden   normal --  EF of 65%   CARDIOVASCULAR STRESS TEST  05-22-2011   dr Cathie Olden   normal lexiscan perfusion study/  no ischemia/  lvsf  86%   CATARACT EXTRACTION W/ INTRAOCULAR LENS  IMPLANT, BILATERAL     CHOLECYSTECTOMY  1993   laparoscopic   CONVERSION TO TOTAL HIP Left 12/03/2017   Procedure: CONVERSION TO LEFT TOTAL HIP;  Surgeon: Paralee Cancel, MD;  Location: [facility];  Service: Orthopedics;  Laterality: Left;  90 mins   HIP ARTHROPLASTY Left 12/22/2016   Procedure: HEMIARTHROPLASTY, LEFT;  Surgeon: Paralee Cancel, MD;  Location: [facility];  Service: Orthopedics;  Laterality: Left;   INCISION AND DRAINAGE OF WOUND Left 10/24/2013   Procedure: IRRIGATION AND DEBRIDEMENT OF LEFT LEG WITH PLACEMENT OF ACELL AND WOUND [place];  Surgeon: Theodoro Kos,  DO;  Location: [facility];  Service: Plastics;  Laterality: Left;   [place] Bilateral rigth 1989///   left  1990   breast cancer 1989//   fibrocytic disease 1990   TOTAL ABDOMINAL HYSTERECTOMY W/ BILATERAL SALPINGOOPHORECTOMY  1989   done at same time as right mastectomy   TOTAL KNEE ARTHROPLASTY Right 10/05/2012   Procedure: RIGHT TOTAL KNEE ARTHROPLASTY;  Surgeon: Mauri Pole, MD;  Location: [facility];  Service: Orthopedics;  Laterality: Right;     OB History   No obstetric history on file.      Home Medications    Prior to Admission medications   Medication Sig Start Date End Date Taking? Authorizing Provider  acetaminophen (TYLENOL) 500 MG tablet Take 2 tablets (1,000 mg total) by mouth every 8 (eight) hours. 12/03/17   Danae Orleans, PA-C  albuterol (PROAIR HFA) 108 (90 Base) MCG/ACT inhaler Inhale 2 puffs into the lungs every 6 (six) hours as needed for wheezing or shortness of breath. 06/15/17   Brand Males, MD  albuterol (PROVENTIL) (2.5 MG/3ML) 0.083% nebulizer solution Take 3 mLs (2.5 mg total) by nebulization every 6 (six) hours as needed for wheezing or shortness of breath. Dx: J45.41 09/03/17   Brand Males, MD  benzonatate (TESSALON) 100 MG capsule Take 1 capsule (100 mg total) by mouth 3 (three) times daily as needed for cough. 12/02/17   Brand Males, MD  Calcium Carbonate-Vitamin D (CALTRATE 600+D) 600-400 MG-UNIT per tablet Take 1 tablet by mouth daily.     [provider]  chlorpheniramine (CHLOR-TRIMETON) 4 MG tablet Take by mouth. 4 mg in the morning and 8 mg at bedtime 01/12/13   Elsie Stain, MD  Cholecalciferol (VITAMIN D3) 1000 UNITS tablet Take 1,000 Units by mouth every evening.     [provider]  dextromethorphan (DELSYM) 30 MG/5ML liquid Take 60 mg by mouth 2 (two) times daily as needed for cough.    [provider]  dextromethorphan-guaiFENesin (MUCINEX DM)  30-600 MG 12hr tablet Take 1 tablet by mouth daily as needed for cough.    [provider]  docusate sodium (COLACE) 100 MG capsule Take 1 capsule (100 mg total) by mouth 2 (two) times daily. 12/03/17   Danae Orleans, PA-C  doxycycline (VIBRA-TABS) 100 MG tablet Take 1 tablet (100 mg total) by mouth 2 (two) times daily. 06/08/18   Brand Males, MD  Dupilumab (DUPIXENT) 300 MG/2ML SOSY Inject 300 mg into the skin every 14 (fourteen) days. 10/19/17   Brand Males, MD  fexofenadine (ALLEGRA) 180 MG tablet Take 180 mg by mouth daily.   12/02/10   Asencion Noble  E, MD  furosemide (LASIX) 20 MG tablet Take 20 mg by mouth daily.    [provider]  ibandronate (BONIVA) 150 MG tablet Take 1 tablet by mouth every 30 (thirty) days.  03/31/18   [provider]  mometasone (NASONEX) 50 MCG/ACT nasal spray Place 2 sprays into the nose daily. 12/02/17   Brand Males, MD  montelukast (SINGULAIR) 10 MG tablet TAKE 1 TABLET BY MOUTH EVERY DAY AT BEDTIME 08/12/18   Brand Males, MD  morphine (MSIR) 15 MG tablet Take 1 tablet (15 mg total) by mouth every 4 (four) hours as needed for severe pain. 09/07/18   Deno Etienne, DO  Multiple Vitamin (MULTIVITAMIN) tablet Take 1 tablet by mouth daily.    [provider]  NEXIUM 40 MG capsule TAKE 1 CAPSULE (40 MG TOTAL) BY MOUTH 2 (TWO) TIMES DAILY. NAME BRAND ONLY 03/30/18   Brand Males, MD  potassium chloride (K-DUR) 10 MEQ tablet TAKE 1 TABLET BY MOUTH EVERY DAY 04/26/18   Nahser, Wonda Cheng, MD  pramipexole (MIRAPEX) 0.125 MG tablet Take 0.375 mg by mouth at bedtime.     [provider]  predniSONE (DELTASONE) 10 MG tablet Take 4tabsx2days,2tabsx2days,1tabx2days,0.5tabx2days,then stop Patient not taking: Reported on 07/27/2018 06/08/18   Brand Males, MD  rosuvastatin (CRESTOR) 5 MG tablet TAKE 1 TABLET BY MOUTH EVERYDAY AT BEDTIME 05/03/18   Nahser, Wonda Cheng, MD  Spacer/Aero-Holding Chambers (AEROCHAMBER MV)  inhaler by Other route. Use as instructed     [provider]  SYMBICORT 160-4.5 MCG/ACT inhaler TAKE 2 PUFFS BY MOUTH TWICE A DAY 05/28/18   Brand Males, MD  telmisartan (MICARDIS) 20 MG tablet TAKE 1 TABLET BY MOUTH EVERYDAY AT BEDTIME 04/30/18   Nahser, Wonda Cheng, MD  Tiotropium Bromide Monohydrate (SPIRIVA RESPIMAT) 2.5 MCG/ACT AERS Inhale 2 puffs into the lungs daily. 12/02/17   Brand Males, MD  traMADol (ULTRAM) 50 MG tablet Take 1 tablet (50 mg total) by mouth every 6 (six) hours as needed. 12/03/17 12/03/18  Danae Orleans, PA-C  vitamin B-12 (CYANOCOBALAMIN) 500 MCG tablet Take 500 mcg by mouth daily.      [provider]  XARELTO 20 MG TABS tablet Take 20 mg by mouth daily with supper.     [provider]    Family History Family History  Problem Relation Age of Onset   Heart attack Mother    Asthma Mother    Heart disease Mother    Hyperlipidemia Mother    Heart attack Father    Heart failure Father    Deep vein thrombosis Father    Heart disease Father    Peripheral vascular disease Father        amputation   Cancer Brother    Asthma Brother    Coronary artery disease Sister    Heart disease Sister     Social History Social History   Tobacco Use   Smoking status: Never Smoker   Smokeless tobacco: Never Used  Substance Use Topics   Alcohol use: No   Drug use: No     Allergies   Gabapentin; Eggs or egg-derived products; Influenza vaccines; Nucala [mepolizumab]; Cephalosporins; Codeine; Levofloxacin; and Pneumococcal vaccines   Review of Systems Review of Systems  Constitutional: Negative for chills and fever.  HENT: Negative for congestion and rhinorrhea.   Eyes: Negative for redness and visual disturbance.  Respiratory: Negative for shortness of breath and wheezing.   Cardiovascular: Negative for chest pain and palpitations.  Gastrointestinal: Negative for nausea and vomiting.  Genitourinary: Negative  for dysuria and urgency.  Musculoskeletal: Positive for arthralgias. Negative for myalgias.  Skin: Negative for pallor and wound.  Neurological: Negative for dizziness and headaches.     Physical Exam Updated Vital Signs BP (!) 141/62    Pulse 81    Temp (!) 97.5 F (36.4 C)    Resp 16    Ht 5\' 5"  (1.651 m)    Wt 72.6 kg    SpO2 99%    BMI 26.63 kg/m   Physical Exam Vitals signs and nursing note reviewed.  Constitutional:      General: She is not in acute distress.    Appearance: She is well-developed. She is not diaphoretic.  HENT:     Head: Normocephalic and atraumatic.  Eyes:     Pupils: Pupils are equal, round, and reactive to light.  Neck:     Musculoskeletal: Normal range of motion and neck supple.  Cardiovascular:     Rate and Rhythm: Normal rate and regular rhythm.     Heart sounds: No murmur. No friction rub. No gallop.   Pulmonary:     Effort: Pulmonary effort is normal.     Breath sounds: No wheezing or rales.  Abdominal:     General: There is no distension.     Palpations: Abdomen is soft.     Tenderness: There is no abdominal tenderness.  Musculoskeletal:        General: Tenderness present.     Comments: Pulse motor and sensation are intact of the right upper extremity.  She has a skin tear to the right dorsal forearm.  Pain at the proximal humerus.  No clavicular pain no scapular pain.  No chest wall pain.  Palpated from head to toe without any other noted areas of bony tenderness.  Skin:    General: Skin is warm and dry.  Neurological:     Mental Status: She is alert and oriented to person, place, and time.  Psychiatric:        Behavior: Behavior normal.      ED Treatments / Results  Labs (all labs ordered are listed, but only abnormal results are displayed) Labs Reviewed - No data to display  EKG None  Radiology Dg Shoulder Right  Result Date: 09/07/2018 CLINICAL DATA:  Right shoulder pain after fall today. EXAM: RIGHT SHOULDER - 2+ VIEW  COMPARISON:  None. FINDINGS: Moderately displaced fracture is seen involving the greater tuberosity of the proximal right humerus. No dislocation of the glenohumeral joint is noted. Mild degenerative changes seen involving the distal right acromioclavicular joint. Ribs are unremarkable. IMPRESSION: Moderately displaced fracture is seen involving the greater tuberosity of proximal right humerus. Electronically Signed   By: Marijo Conception M.D.   On: 09/07/2018 15:43   Dg Wrist Complete Right  Result Date: 09/07/2018 CLINICAL DATA:  Right wrist pain after fall today. EXAM: RIGHT WRIST - COMPLETE 3+ VIEW COMPARISON:  None. FINDINGS: There is no evidence of fracture or dislocation. Severe narrowing is seen involving the first carpometacarpal joint. Chondrocalcinosis of triangular fibrocartilage is noted. Soft tissues are unremarkable. IMPRESSION: Osteoarthritis of the first carpometacarpal joint. No acute abnormality seen in the right wrist. Electronically Signed   By: Marijo Conception M.D.   On: 09/07/2018 15:45   Dg Hip Unilat W Or Wo Pelvis 2-3 Views Left  Result Date: 09/07/2018 CLINICAL DATA:  [place] today, prior LEFT hip replacement EXAM: DG HIP (WITH OR WITHOUT PELVIS) 2-3V LEFT COMPARISON:  12/19/2016 FINDINGS: Mild  osseous demineralization. Components of a LEFT hip prosthesis are identified in expected position. No acute fracture, dislocation or bone destruction. RIGHT hip joint and SI joints appear preserved. Degenerative disc and facet disease changes at visualized lower lumbar spine. IMPRESSION: No acute osseous abnormalities. Electronically Signed   By: Lavonia Dana M.D.   On: 09/07/2018 15:43    Procedures Procedures (including critical care time)  Medications Ordered in ED Medications  Tdap (BOOSTRIX) injection 0.5 mL (0.5 mLs Intramuscular Given 09/07/18 1615)  acetaminophen (TYLENOL) tablet 1,000 mg (1,000 mg Oral Given 09/07/18 1518)     Initial Impression / Assessment and Plan / ED  Course  I have reviewed the triage vital signs and the nursing notes.  Pertinent labs & imaging results that were available during my care of the patient were reviewed by me and considered in my medical decision making (see chart for details).        85 yo F with right shoulder pain post fall.  Plain film viewed with greater tuberosity fx.  Tdap updated.  D/c home.   4:44 PM:  I have discussed the diagnosis/risks/treatment options with the patient and believe the pt to be eligible for discharge home to follow-up with PCP. We also discussed returning to the ED immediately if new or worsening sx occur. We discussed the sx which are most concerning (e.g., sudden worsening pain, fever, inability to tolerate by mouth) that necessitate immediate return. Medications administered to the patient during their visit and any new prescriptions provided to the patient are listed below.  Medications given during this visit Medications  Tdap (BOOSTRIX) injection 0.5 mL (0.5 mLs Intramuscular Given 09/07/18 1615)  acetaminophen (TYLENOL) tablet 1,000 mg (1,000 mg Oral Given 09/07/18 1518)     The patient appears reasonably screen and/or stabilized for discharge and I doubt any other medical condition or other [facility] requiring further screening, evaluation, or treatment in the ED at this time prior to discharge.    Final Clinical Impressions(s) / ED Diagnoses   Final diagnoses:  Displaced fracture of greater tuberosity of right humerus, initial encounter for closed fracture    ED Discharge Orders         Ordered    morphine (MSIR) 15 MG tablet  Every 4 hours PRN     09/07/18 [place], [place], DO 09/07/18 1644

## 2018-09-07 NOTE — ED Notes (Signed)
Pt verbalized understanding of discharge instructions. Pt declined taking v/s at discharge

## 2018-09-08 ENCOUNTER — Other Ambulatory Visit: Payer: Self-pay | Admitting: Specialist

## 2018-09-08 DIAGNOSIS — S42201A Unspecified fracture of upper end of right humerus, initial encounter for closed fracture: Secondary | ICD-10-CM | POA: Diagnosis not present

## 2018-09-08 DIAGNOSIS — S42294A Other nondisplaced fracture of upper end of right humerus, initial encounter for closed fracture: Secondary | ICD-10-CM

## 2018-09-09 ENCOUNTER — Ambulatory Visit
Admission: RE | Admit: 2018-09-09 | Discharge: 2018-09-09 | Disposition: A | Discharge status: Home / Self Care | Payer: Medicare Other | Source: Ambulatory Visit | Attending: Specialist | Admitting: Specialist

## 2018-09-09 ENCOUNTER — Other Ambulatory Visit: Payer: Self-pay

## 2018-09-09 DIAGNOSIS — S42294A Other nondisplaced fracture of upper end of right humerus, initial encounter for closed fracture: Secondary | ICD-10-CM

## 2018-09-09 DIAGNOSIS — M25511 Pain in right shoulder: Secondary | ICD-10-CM | POA: Diagnosis not present

## 2018-09-09 DIAGNOSIS — R937 Abnormal findings on diagnostic imaging of other parts of musculoskeletal system: Secondary | ICD-10-CM | POA: Diagnosis not present

## 2018-09-14 ENCOUNTER — Other Ambulatory Visit: Payer: Self-pay

## 2018-09-14 ENCOUNTER — Ambulatory Visit (INDEPENDENT_AMBULATORY_CARE_PROVIDER_SITE_OTHER): Payer: Medicare Other

## 2018-09-14 DIAGNOSIS — J4550 Severe persistent asthma, uncomplicated: Secondary | ICD-10-CM

## 2018-09-14 MED ORDER — DUPILUMAB 300 MG/2ML ~~LOC~~ SOSY
300.0000 mg | PREFILLED_SYRINGE | Freq: Once | SUBCUTANEOUS | Status: AC
  Administered 2018-09-14: 300 mg via SUBCUTANEOUS

## 2018-09-14 NOTE — Progress Notes (Signed)
Have you been hospitalized within the last 10 days?  No Do you have a fever?  No Do you have a cough?  Yes dry Do you have a headache or sore throat? No

## 2018-09-15 DIAGNOSIS — S42201A Unspecified fracture of upper end of right humerus, initial encounter for closed fracture: Secondary | ICD-10-CM | POA: Diagnosis not present

## 2018-09-17 ENCOUNTER — Other Ambulatory Visit: Payer: Self-pay

## 2018-09-17 ENCOUNTER — Other Ambulatory Visit (HOSPITAL_COMMUNITY)
Admission: RE | Admit: 2018-09-17 | Discharge: 2018-09-17 | Disposition: A | Discharge status: Home / Self Care | Payer: Medicare Other | Source: Ambulatory Visit | Attending: Orthopedic Surgery | Admitting: Orthopedic Surgery

## 2018-09-17 DIAGNOSIS — Z1159 Encounter for screening for other viral diseases: Secondary | ICD-10-CM | POA: Insufficient documentation

## 2018-09-18 LAB — NOVEL CORONAVIRUS, NAA (HOSP ORDER, SEND-OUT TO REF LAB; TAT 18-24 HRS): SARS-CoV-2, NAA: NOT DETECTED

## 2018-09-20 ENCOUNTER — Encounter (HOSPITAL_COMMUNITY): Payer: Self-pay | Admitting: *Deleted

## 2018-09-20 ENCOUNTER — Other Ambulatory Visit: Payer: Self-pay

## 2018-09-20 MED ORDER — TRANEXAMIC ACID-NACL 1000-0.7 MG/100ML-% IV SOLN
1000.0000 mg | INTRAVENOUS | Status: AC
  Administered 2018-09-21: 1000 mg via INTRAVENOUS
  Filled 2018-09-20: qty 100

## 2018-09-20 NOTE — Progress Notes (Signed)
Pt and husband picked up Pre-surgery Ensure. Pt voiced understanding of when to drink it.

## 2018-09-20 NOTE — Progress Notes (Signed)
Spoke with pt for pre-op call. Pt sees Dr. Acie Fredrickson because years ago she had what everyone thought a heart attack, but it wasn't, she states it was due to her asthma and vomiting so much. She had normal coronaries in 2008 per Dr. Acie Fredrickson. Pt denies any recent chest pain. Pt does have asthma, but states it's under control at this time. Pt states she has had several blood clots in her legs. Takes Xarelto. Last dose was 09/18/18 - states she was instructed to stop it 3 days prior to surgery. Pt instructed that she is no to eat after midnight tonight but can have clear liquids until 4:30 AM. Pt was ordered to have the Pre-surgery Ensure drink by 4:30 AM. She states she and her husband can come pick it up this afternoon. Instructed her to have it finished by 4:30 AM.  Pt voiced understanding.

## 2018-09-21 ENCOUNTER — Inpatient Hospital Stay (HOSPITAL_COMMUNITY): Payer: Medicare Other

## 2018-09-21 ENCOUNTER — Inpatient Hospital Stay (HOSPITAL_COMMUNITY): Payer: Medicare Other | Admitting: Anesthesiology

## 2018-09-21 ENCOUNTER — Observation Stay (HOSPITAL_COMMUNITY)
Admission: RE | Admit: 2018-09-21 | Discharge: 2018-09-22 | Disposition: A | Discharge status: Home / Self Care | Payer: Medicare Other | Attending: Orthopedic Surgery | Admitting: Orthopedic Surgery

## 2018-09-21 ENCOUNTER — Other Ambulatory Visit: Payer: Self-pay

## 2018-09-21 ENCOUNTER — Encounter (HOSPITAL_COMMUNITY)
Admission: RE | Disposition: A | Discharge status: Home / Self Care | Payer: Self-pay | Source: Home / Self Care | Attending: Orthopedic Surgery

## 2018-09-21 ENCOUNTER — Encounter (HOSPITAL_COMMUNITY): Payer: Self-pay | Admitting: Anesthesiology

## 2018-09-21 DIAGNOSIS — I739 Peripheral vascular disease, unspecified: Secondary | ICD-10-CM | POA: Insufficient documentation

## 2018-09-21 DIAGNOSIS — I130 Hypertensive heart and chronic kidney disease with heart failure and stage 1 through stage 4 chronic kidney disease, or unspecified chronic kidney disease: Secondary | ICD-10-CM | POA: Diagnosis not present

## 2018-09-21 DIAGNOSIS — Z90710 Acquired absence of both cervix and uterus: Secondary | ICD-10-CM | POA: Insufficient documentation

## 2018-09-21 DIAGNOSIS — Z86718 Personal history of other venous thrombosis and embolism: Secondary | ICD-10-CM | POA: Insufficient documentation

## 2018-09-21 DIAGNOSIS — M25511 Pain in right shoulder: Secondary | ICD-10-CM | POA: Diagnosis present

## 2018-09-21 DIAGNOSIS — S42251A Displaced fracture of greater tuberosity of right humerus, initial encounter for closed fracture: Secondary | ICD-10-CM | POA: Diagnosis not present

## 2018-09-21 DIAGNOSIS — J4550 Severe persistent asthma, uncomplicated: Secondary | ICD-10-CM | POA: Diagnosis not present

## 2018-09-21 DIAGNOSIS — Z96651 Presence of right artificial knee joint: Secondary | ICD-10-CM | POA: Insufficient documentation

## 2018-09-21 DIAGNOSIS — I2510 Atherosclerotic heart disease of native coronary artery without angina pectoris: Secondary | ICD-10-CM | POA: Diagnosis not present

## 2018-09-21 DIAGNOSIS — N182 Chronic kidney disease, stage 2 (mild): Secondary | ICD-10-CM | POA: Diagnosis not present

## 2018-09-21 DIAGNOSIS — Z9013 Acquired absence of bilateral breasts and nipples: Secondary | ICD-10-CM | POA: Diagnosis not present

## 2018-09-21 DIAGNOSIS — Z7983 Long term (current) use of bisphosphonates: Secondary | ICD-10-CM | POA: Insufficient documentation

## 2018-09-21 DIAGNOSIS — W1830XA Fall on same level, unspecified, initial encounter: Secondary | ICD-10-CM | POA: Diagnosis not present

## 2018-09-21 DIAGNOSIS — I509 Heart failure, unspecified: Secondary | ICD-10-CM | POA: Insufficient documentation

## 2018-09-21 DIAGNOSIS — Z853 Personal history of malignant neoplasm of breast: Secondary | ICD-10-CM | POA: Insufficient documentation

## 2018-09-21 DIAGNOSIS — Z7951 Long term (current) use of inhaled steroids: Secondary | ICD-10-CM | POA: Insufficient documentation

## 2018-09-21 DIAGNOSIS — Z79899 Other long term (current) drug therapy: Secondary | ICD-10-CM | POA: Insufficient documentation

## 2018-09-21 DIAGNOSIS — E785 Hyperlipidemia, unspecified: Secondary | ICD-10-CM | POA: Diagnosis not present

## 2018-09-21 DIAGNOSIS — S42201A Unspecified fracture of upper end of right humerus, initial encounter for closed fracture: Secondary | ICD-10-CM | POA: Diagnosis present

## 2018-09-21 DIAGNOSIS — I129 Hypertensive chronic kidney disease with stage 1 through stage 4 chronic kidney disease, or unspecified chronic kidney disease: Secondary | ICD-10-CM | POA: Insufficient documentation

## 2018-09-21 DIAGNOSIS — Z419 Encounter for procedure for purposes other than remedying health state, unspecified: Secondary | ICD-10-CM

## 2018-09-21 DIAGNOSIS — Z7901 Long term (current) use of anticoagulants: Secondary | ICD-10-CM | POA: Insufficient documentation

## 2018-09-21 DIAGNOSIS — G8918 Other acute postprocedural pain: Secondary | ICD-10-CM | POA: Diagnosis not present

## 2018-09-21 DIAGNOSIS — K219 Gastro-esophageal reflux disease without esophagitis: Secondary | ICD-10-CM | POA: Diagnosis not present

## 2018-09-21 HISTORY — DX: Peripheral vascular disease, unspecified: I73.9

## 2018-09-21 HISTORY — PX: ORIF HUMERUS FRACTURE: SHX2126

## 2018-09-21 HISTORY — DX: Headache, unspecified: R51.9

## 2018-09-21 HISTORY — DX: Other hemorrhoids: K64.8

## 2018-09-21 HISTORY — DX: Diverticulitis of intestine, part unspecified, without perforation or abscess without bleeding: K57.92

## 2018-09-21 HISTORY — DX: Anemia, unspecified: D64.9

## 2018-09-21 LAB — CBC
HCT: 34.8 % — ABNORMAL LOW (ref 36.0–46.0)
Hemoglobin: 10.8 g/dL — ABNORMAL LOW (ref 12.0–15.0)
MCH: 28.9 pg (ref 26.0–34.0)
MCHC: 31 g/dL (ref 30.0–36.0)
MCV: 93 fL (ref 80.0–100.0)
Platelets: 391 10*3/uL (ref 150–400)
RBC: 3.74 MIL/uL — ABNORMAL LOW (ref 3.87–5.11)
RDW: 13.1 % (ref 11.5–15.5)
WBC: 4.4 10*3/uL (ref 4.0–10.5)
nRBC: 0 % (ref 0.0–0.2)

## 2018-09-21 LAB — BASIC METABOLIC PANEL
Anion gap: 11 (ref 5–15)
BUN: 15 mg/dL (ref 8–23)
CO2: 26 mmol/L (ref 22–32)
Calcium: 9.2 mg/dL (ref 8.9–10.3)
Chloride: 100 mmol/L (ref 98–111)
Creatinine, Ser: 1.49 mg/dL — ABNORMAL HIGH (ref 0.44–1.00)
GFR calc Af Amer: 37 mL/min — ABNORMAL LOW (ref >60)
GFR calc non Af Amer: 32 mL/min — ABNORMAL LOW (ref >60)
Glucose, Bld: 84 mg/dL (ref 70–99)
Potassium: 3.5 mmol/L (ref 3.5–5.1)
Sodium: 137 mmol/L (ref 135–145)

## 2018-09-21 LAB — PROTIME-INR
INR: 1 (ref 0.8–1.2)
Prothrombin Time: 12.9 seconds (ref 11.4–15.2)

## 2018-09-21 SURGERY — OPEN REDUCTION INTERNAL FIXATION (ORIF) PROXIMAL HUMERUS FRACTURE
Anesthesia: General | Site: Arm Upper | Laterality: Right

## 2018-09-21 MED ORDER — PHENYLEPHRINE 40 MCG/ML (10ML) SYRINGE FOR IV PUSH (FOR BLOOD PRESSURE SUPPORT)
PREFILLED_SYRINGE | INTRAVENOUS | Status: AC
  Filled 2018-09-21: qty 10

## 2018-09-21 MED ORDER — UMECLIDINIUM BROMIDE 62.5 MCG/INH IN AEPB
2.0000 | INHALATION_SPRAY | Freq: Every day | RESPIRATORY_TRACT | Status: DC
  Administered 2018-09-21: 2 via RESPIRATORY_TRACT
  Filled 2018-09-21: qty 7

## 2018-09-21 MED ORDER — CEFAZOLIN SODIUM-DEXTROSE 2-4 GM/100ML-% IV SOLN
INTRAVENOUS | Status: AC
  Filled 2018-09-21: qty 100

## 2018-09-21 MED ORDER — PHENYLEPHRINE 40 MCG/ML (10ML) SYRINGE FOR IV PUSH (FOR BLOOD PRESSURE SUPPORT)
PREFILLED_SYRINGE | INTRAVENOUS | Status: DC | PRN
  Administered 2018-09-21: 80 ug via INTRAVENOUS
  Administered 2018-09-21: 40 ug via INTRAVENOUS
  Administered 2018-09-21: 120 ug via INTRAVENOUS
  Administered 2018-09-21: 80 ug via INTRAVENOUS

## 2018-09-21 MED ORDER — MOMETASONE FURO-FORMOTEROL FUM 200-5 MCG/ACT IN AERO
2.0000 | INHALATION_SPRAY | Freq: Two times a day (BID) | RESPIRATORY_TRACT | Status: DC
  Filled 2018-09-21: qty 8.8

## 2018-09-21 MED ORDER — METOCLOPRAMIDE HCL 5 MG/ML IJ SOLN: 5.0000 mg | Freq: Three times a day (TID) | INTRAMUSCULAR | Status: DC | PRN

## 2018-09-21 MED ORDER — FLUTICASONE PROPIONATE 50 MCG/ACT NA SUSP
1.0000 | Freq: Every day | NASAL | Status: DC
  Filled 2018-09-21: qty 16

## 2018-09-21 MED ORDER — ONDANSETRON HCL 4 MG/2ML IJ SOLN: 4.0000 mg | Freq: Once | INTRAMUSCULAR | Status: DC | PRN

## 2018-09-21 MED ORDER — BUPIVACAINE-EPINEPHRINE (PF) 0.5% -1:200000 IJ SOLN
INTRAMUSCULAR | Status: DC | PRN
  Administered 2018-09-21: 15 mL via PERINEURAL

## 2018-09-21 MED ORDER — ACETAMINOPHEN 325 MG PO TABS
650.0000 mg | ORAL_TABLET | Freq: Four times a day (QID) | ORAL | Status: DC | PRN
  Administered 2018-09-22: 650 mg via ORAL
  Filled 2018-09-21: qty 2

## 2018-09-21 MED ORDER — FENTANYL CITRATE (PF) 100 MCG/2ML IJ SOLN: 25.0000 ug | Freq: Once | INTRAMUSCULAR | Status: DC

## 2018-09-21 MED ORDER — CEFAZOLIN SODIUM-DEXTROSE 2-4 GM/100ML-% IV SOLN: 2.0000 g | INTRAVENOUS | Status: DC

## 2018-09-21 MED ORDER — MIDAZOLAM HCL 2 MG/2ML IJ SOLN
INTRAMUSCULAR | Status: AC
  Administered 2018-09-21: 08:00:00 0.5 mg via INTRAVENOUS
  Filled 2018-09-21: qty 2

## 2018-09-21 MED ORDER — SODIUM CHLORIDE 0.9 % IV SOLN
INTRAVENOUS | Status: DC | PRN
  Administered 2018-09-21: 50 ug/min via INTRAVENOUS

## 2018-09-21 MED ORDER — IRBESARTAN 75 MG PO TABS
75.0000 mg | ORAL_TABLET | Freq: Every day | ORAL | Status: DC
  Administered 2018-09-21 – 2018-09-22 (×2): 75 mg via ORAL
  Filled 2018-09-21 (×2): qty 1

## 2018-09-21 MED ORDER — LIDOCAINE 2% (20 MG/ML) 5 ML SYRINGE
INTRAMUSCULAR | Status: DC | PRN
  Administered 2018-09-21: 20 mg via INTRAVENOUS

## 2018-09-21 MED ORDER — NAPROXEN 250 MG PO TABS
250.0000 mg | ORAL_TABLET | Freq: Two times a day (BID) | ORAL | Status: DC
  Administered 2018-09-21 – 2018-09-22 (×2): 250 mg via ORAL
  Filled 2018-09-21 (×2): qty 1

## 2018-09-21 MED ORDER — SODIUM CHLORIDE 0.9 % IR SOLN
Status: DC | PRN
  Administered 2018-09-21: 1000 mL

## 2018-09-21 MED ORDER — POTASSIUM CHLORIDE ER 10 MEQ PO TBCR
10.0000 meq | EXTENDED_RELEASE_TABLET | Freq: Every day | ORAL | Status: DC
  Administered 2018-09-22: 10 meq via ORAL
  Filled 2018-09-21 (×2): qty 1

## 2018-09-21 MED ORDER — SUCCINYLCHOLINE CHLORIDE 20 MG/ML IJ SOLN
INTRAMUSCULAR | Status: DC | PRN
  Administered 2018-09-21: 100 mg via INTRAVENOUS

## 2018-09-21 MED ORDER — FUROSEMIDE 20 MG PO TABS
20.0000 mg | ORAL_TABLET | Freq: Every day | ORAL | Status: DC
  Administered 2018-09-21 – 2018-09-22 (×2): 20 mg via ORAL
  Filled 2018-09-21 (×2): qty 1

## 2018-09-21 MED ORDER — ONDANSETRON HCL 4 MG/2ML IJ SOLN
INTRAMUSCULAR | Status: AC
  Filled 2018-09-21: qty 2

## 2018-09-21 MED ORDER — RIVAROXABAN 20 MG PO TABS: 20.0000 mg | ORAL_TABLET | Freq: Every day | ORAL | Status: DC

## 2018-09-21 MED ORDER — MORPHINE SULFATE (PF) 2 MG/ML IV SOLN: 0.5000 mg | INTRAVENOUS | Status: DC | PRN

## 2018-09-21 MED ORDER — CLINDAMYCIN PHOSPHATE 900 MG/50ML IV SOLN
900.0000 mg | INTRAVENOUS | Status: AC
  Administered 2018-09-21: 10:00:00 900 mg via INTRAVENOUS
  Filled 2018-09-21: qty 50

## 2018-09-21 MED ORDER — SENNOSIDES-DOCUSATE SODIUM 8.6-50 MG PO TABS: 1.0000 | ORAL_TABLET | Freq: Every day | ORAL | Status: DC | PRN

## 2018-09-21 MED ORDER — LACTATED RINGERS IV SOLN
INTRAVENOUS | Status: DC
  Administered 2018-09-21: 08:00:00 via INTRAVENOUS

## 2018-09-21 MED ORDER — ROSUVASTATIN CALCIUM 5 MG PO TABS
5.0000 mg | ORAL_TABLET | Freq: Every day | ORAL | Status: DC
  Administered 2018-09-21: 21:00:00 5 mg via ORAL
  Filled 2018-09-21: qty 1

## 2018-09-21 MED ORDER — MENTHOL 3 MG MT LOZG: 1.0000 | LOZENGE | OROMUCOSAL | Status: DC | PRN

## 2018-09-21 MED ORDER — ALBUTEROL SULFATE (2.5 MG/3ML) 0.083% IN NEBU: 2.5000 mg | INHALATION_SOLUTION | Freq: Four times a day (QID) | RESPIRATORY_TRACT | Status: DC | PRN

## 2018-09-21 MED ORDER — FENTANYL CITRATE (PF) 100 MCG/2ML IJ SOLN
INTRAMUSCULAR | Status: DC | PRN
  Administered 2018-09-21: 50 ug via INTRAVENOUS

## 2018-09-21 MED ORDER — ONDANSETRON HCL 4 MG/2ML IJ SOLN: 4.0000 mg | Freq: Four times a day (QID) | INTRAMUSCULAR | Status: DC | PRN

## 2018-09-21 MED ORDER — PRAMIPEXOLE DIHYDROCHLORIDE 0.125 MG PO TABS
0.3750 mg | ORAL_TABLET | Freq: Every day | ORAL | Status: DC
  Administered 2018-09-21: 0.375 mg via ORAL
  Filled 2018-09-21: qty 3

## 2018-09-21 MED ORDER — LIDOCAINE 2% (20 MG/ML) 5 ML SYRINGE
INTRAMUSCULAR | Status: AC
  Filled 2018-09-21: qty 5

## 2018-09-21 MED ORDER — HYDROCODONE-ACETAMINOPHEN 5-325 MG PO TABS
1.0000 | ORAL_TABLET | ORAL | Status: DC | PRN
  Administered 2018-09-22 (×2): 1 via ORAL
  Filled 2018-09-21 (×2): qty 1

## 2018-09-21 MED ORDER — DOCUSATE SODIUM 100 MG PO CAPS
100.0000 mg | ORAL_CAPSULE | Freq: Two times a day (BID) | ORAL | Status: DC
  Administered 2018-09-21 – 2018-09-22 (×3): 100 mg via ORAL
  Filled 2018-09-21 (×3): qty 1

## 2018-09-21 MED ORDER — MIDAZOLAM HCL 2 MG/2ML IJ SOLN
1.0000 mg | Freq: Once | INTRAMUSCULAR | Status: AC
  Administered 2018-09-21: 08:00:00 0.5 mg via INTRAVENOUS

## 2018-09-21 MED ORDER — FENTANYL CITRATE (PF) 100 MCG/2ML IJ SOLN
12.5000 ug | Freq: Once | INTRAMUSCULAR | Status: AC
  Administered 2018-09-21: 08:00:00 50 ug via INTRAVENOUS

## 2018-09-21 MED ORDER — FENTANYL CITRATE (PF) 100 MCG/2ML IJ SOLN: 25.0000 ug | INTRAMUSCULAR | Status: DC | PRN

## 2018-09-21 MED ORDER — PROPOFOL 10 MG/ML IV BOLUS
INTRAVENOUS | Status: AC
  Filled 2018-09-21: qty 20

## 2018-09-21 MED ORDER — FENTANYL CITRATE (PF) 100 MCG/2ML IJ SOLN
INTRAMUSCULAR | Status: AC
  Administered 2018-09-21: 08:00:00 50 ug via INTRAVENOUS
  Filled 2018-09-21: qty 2

## 2018-09-21 MED ORDER — CLINDAMYCIN PHOSPHATE 600 MG/50ML IV SOLN
600.0000 mg | Freq: Four times a day (QID) | INTRAVENOUS | Status: AC
  Administered 2018-09-21 – 2018-09-22 (×3): 600 mg via INTRAVENOUS
  Filled 2018-09-21 (×3): qty 50

## 2018-09-21 MED ORDER — FENTANYL CITRATE (PF) 250 MCG/5ML IJ SOLN
INTRAMUSCULAR | Status: AC
  Filled 2018-09-21: qty 5

## 2018-09-21 MED ORDER — ALBUTEROL SULFATE HFA 108 (90 BASE) MCG/ACT IN AERS: 2.0000 | INHALATION_SPRAY | Freq: Four times a day (QID) | RESPIRATORY_TRACT | Status: DC | PRN

## 2018-09-21 MED ORDER — ONDANSETRON HCL 4 MG PO TABS: 4.0000 mg | ORAL_TABLET | Freq: Four times a day (QID) | ORAL | Status: DC | PRN

## 2018-09-21 MED ORDER — CHLORHEXIDINE GLUCONATE 4 % EX LIQD: 60.0000 mL | Freq: Once | CUTANEOUS | Status: DC

## 2018-09-21 MED ORDER — HYDROCODONE-ACETAMINOPHEN 7.5-325 MG PO TABS: 1.0000 | ORAL_TABLET | ORAL | Status: DC | PRN

## 2018-09-21 MED ORDER — ONDANSETRON HCL 4 MG/2ML IJ SOLN
INTRAMUSCULAR | Status: DC | PRN
  Administered 2018-09-21: 4 mg via INTRAVENOUS

## 2018-09-21 MED ORDER — PROPOFOL 10 MG/ML IV BOLUS
INTRAVENOUS | Status: DC | PRN
  Administered 2018-09-21: 10 mg via INTRAVENOUS
  Administered 2018-09-21: 20 mg via INTRAVENOUS
  Administered 2018-09-21: 90 mg via INTRAVENOUS

## 2018-09-21 MED ORDER — METOCLOPRAMIDE HCL 5 MG PO TABS: 5.0000 mg | ORAL_TABLET | Freq: Three times a day (TID) | ORAL | Status: DC | PRN

## 2018-09-21 MED ORDER — PANTOPRAZOLE SODIUM 40 MG PO TBEC
40.0000 mg | DELAYED_RELEASE_TABLET | Freq: Every day | ORAL | Status: DC
  Administered 2018-09-22: 09:00:00 40 mg via ORAL
  Filled 2018-09-21: qty 1

## 2018-09-21 MED ORDER — PHENOL 1.4 % MT LIQD: 1.0000 | OROMUCOSAL | Status: DC | PRN

## 2018-09-21 MED ORDER — BUPIVACAINE LIPOSOME 1.3 % IJ SUSP
INTRAMUSCULAR | Status: DC | PRN
  Administered 2018-09-21: 10 mL via PERINEURAL

## 2018-09-21 MED ORDER — MONTELUKAST SODIUM 10 MG PO TABS
10.0000 mg | ORAL_TABLET | Freq: Every day | ORAL | Status: DC
  Administered 2018-09-21: 10 mg via ORAL
  Filled 2018-09-21: qty 1

## 2018-09-21 MED ORDER — SUCCINYLCHOLINE CHLORIDE 200 MG/10ML IV SOSY
PREFILLED_SYRINGE | INTRAVENOUS | Status: AC
  Filled 2018-09-21: qty 10

## 2018-09-21 SURGICAL SUPPLY — 58 items
ALCOHOL 70% 16 OZ (MISCELLANEOUS) ×3 IMPLANT
ANCHOR KNTLS VENTIX 4.75 (Anchor) ×6 IMPLANT
BIT DRILL 5/64X5 DISP (BIT) ×3 IMPLANT
CLOSURE WOUND 1/2 X4 (GAUZE/BANDAGES/DRESSINGS)
COVER SURGICAL LIGHT HANDLE (MISCELLANEOUS) ×3 IMPLANT
COVER WAND RF STERILE (DRAPES) ×3 IMPLANT
DRAPE INCISE IOBAN 66X45 STRL (DRAPES) ×3 IMPLANT
DRAPE ORTHO SPLIT 77X108 STRL (DRAPES) ×4
DRAPE SURG ORHT 6 SPLT 77X108 (DRAPES) ×2 IMPLANT
DRAPE U-SHAPE 47X51 STRL (DRAPES) ×3 IMPLANT
DRSG ADAPTIC 3X8 NADH LF (GAUZE/BANDAGES/DRESSINGS) ×3 IMPLANT
DRSG AQUACEL AG ADV 3.5X10 (GAUZE/BANDAGES/DRESSINGS) ×3 IMPLANT
DRSG PAD ABDOMINAL 8X10 ST (GAUZE/BANDAGES/DRESSINGS) ×3 IMPLANT
DRSG TEGADERM 2-3/8X2-3/4 SM (GAUZE/BANDAGES/DRESSINGS) ×3 IMPLANT
DURAPREP 26ML APPLICATOR (WOUND CARE) ×3 IMPLANT
ELECT BLADE 4.0 EZ CLEAN MEGAD (MISCELLANEOUS) ×3
ELECT REM PT RETURN 9FT ADLT (ELECTROSURGICAL) ×3
ELECTRODE BLDE 4.0 EZ CLN MEGD (MISCELLANEOUS) ×1 IMPLANT
ELECTRODE REM PT RTRN 9FT ADLT (ELECTROSURGICAL) ×1 IMPLANT
GAUZE SPONGE 4X4 12PLY STRL (GAUZE/BANDAGES/DRESSINGS) ×3 IMPLANT
GLOVE BIO SURGEON STRL SZ7.5 (GLOVE) ×3 IMPLANT
GLOVE BIOGEL PI IND STRL 8 (GLOVE) ×1 IMPLANT
GLOVE BIOGEL PI INDICATOR 8 (GLOVE) ×2
GOWN STRL REUS W/ TWL LRG LVL3 (GOWN DISPOSABLE) ×1 IMPLANT
GOWN STRL REUS W/ TWL XL LVL3 (GOWN DISPOSABLE) ×2 IMPLANT
GOWN STRL REUS W/TWL LRG LVL3 (GOWN DISPOSABLE) ×2
GOWN STRL REUS W/TWL XL LVL3 (GOWN DISPOSABLE) ×4
KIT BASIN OR (CUSTOM PROCEDURE TRAY) ×3 IMPLANT
KIT BEACH CHAIR TRIMANO (MISCELLANEOUS) IMPLANT
KIT TURNOVER KIT B (KITS) ×3 IMPLANT
MANIFOLD NEPTUNE II (INSTRUMENTS) ×3 IMPLANT
NEEDLE 1/2 CIR MAYO (NEEDLE) ×3 IMPLANT
NEEDLE HYPO 25GX1X1/2 BEV (NEEDLE) ×3 IMPLANT
NS IRRIG 1000ML POUR BTL (IV SOLUTION) ×3 IMPLANT
PACK SHOULDER (CUSTOM PROCEDURE TRAY) ×3 IMPLANT
PAD ARMBOARD 7.5X6 YLW CONV (MISCELLANEOUS) ×3 IMPLANT
SLING ARM FOAM STRAP LRG (SOFTGOODS) IMPLANT
SLING ARM FOAM STRAP MED (SOFTGOODS) IMPLANT
SPONGE LAP 18X18 RF (DISPOSABLE) IMPLANT
SPONGE LAP 4X18 RFD (DISPOSABLE) ×3 IMPLANT
STRIP CLOSURE SKIN 1/2X4 (GAUZE/BANDAGES/DRESSINGS) IMPLANT
SUCTION FRAZIER HANDLE 10FR (MISCELLANEOUS) ×2
SUCTION TUBE FRAZIER 10FR DISP (MISCELLANEOUS) ×1 IMPLANT
SUT BROADBAND TAPE 2PK 1.5 (SUTURE) ×3 IMPLANT
SUT FIBERWIRE #2 38 T-5 BLUE (SUTURE)
SUT MAXBRAID (SUTURE) ×6 IMPLANT
SUT MNCRL AB 4-0 PS2 18 (SUTURE) IMPLANT
SUT MON AB 5-0 PS2 18 (SUTURE) ×6 IMPLANT
SUT VIC AB 0 CT1 27 (SUTURE) ×2
SUT VIC AB 0 CT1 27XBRD ANBCTR (SUTURE) ×1 IMPLANT
SUT VIC AB 2-0 CT1 27 (SUTURE)
SUT VIC AB 2-0 CT1 TAPERPNT 27 (SUTURE) IMPLANT
SUTURE FIBERWR #2 38 T-5 BLUE (SUTURE) IMPLANT
SYR CONTROL 10ML LL (SYRINGE) ×3 IMPLANT
TOWEL OR 17X24 6PK STRL BLUE (TOWEL DISPOSABLE) ×3 IMPLANT
TOWEL OR 17X26 10 PK STRL BLUE (TOWEL DISPOSABLE) ×3 IMPLANT
WATER STERILE IRR 1000ML POUR (IV SOLUTION) IMPLANT
YANKAUER SUCT BULB TIP NO VENT (SUCTIONS) ×3 IMPLANT

## 2018-09-21 NOTE — Transfer of Care (Signed)
Immediate Anesthesia Transfer of Care Note  Patient: [name]  Procedure(s) Performed: OPEN REDUCTION INTERNAL FIXATION (ORIF) PROXIMAL HUMERUS FRACTURE (Right Arm Upper)  Patient Location: PACU  Anesthesia Type:General  Level of Consciousness: oriented, drowsy and patient cooperative  Airway & Oxygen Therapy: Patient Spontanous Breathing and Patient connected to face mask oxygen  Post-op Assessment: Report given to RN and Post -op Vital signs reviewed and stable  Post vital signs: Reviewed  Last Vitals:  Vitals Value Taken Time  BP 138/54 09/21/2018 10:54 AM  Temp    Pulse 73 09/21/2018 10:53 AM  Resp 20 09/21/2018 10:55 AM  SpO2 100 % 09/21/2018 10:53 AM  Vitals shown include unvalidated device data.  Last Pain:  Vitals:   09/21/18 0840  TempSrc:   PainSc: 0-No pain      Patients Stated Pain Goal: 2 (73/71/06 2694)  Complications: No apparent anesthesia complications

## 2018-09-21 NOTE — Anesthesia Procedure Notes (Addendum)
Procedure Name: Intubation Date/Time: 09/21/2018 9:28 AM Performed by: Jenne Campus, CRNA Pre-anesthesia Checklist: Patient identified, Emergency Drugs available, Suction available and Patient being monitored Patient Re-evaluated:Patient Re-evaluated prior to induction Oxygen Delivery Method: Circle System Utilized Preoxygenation: Pre-oxygenation with 100% oxygen Induction Type: IV induction, Rapid sequence and Cricoid Pressure applied Laryngoscope Size: Mac and 3 Grade View: Grade I Tube type: Oral Tube size: 7.0 mm Number of attempts: 1 Airway Equipment and Method: Stylet and Oral airway Placement Confirmation: ETT inserted through vocal cords under direct vision,  positive ETCO2 and breath sounds checked- equal and bilateral Secured at: 21 cm Tube secured with: Tape Dental Injury: Teeth and Oropharynx as per pre-operative assessment

## 2018-09-21 NOTE — Brief Op Note (Signed)
09/21/2018  10:42 AM  PATIENT:  [name]  85 y.o. female  PRE-OPERATIVE DIAGNOSIS:  Right proximal humerus fracture  POST-OPERATIVE DIAGNOSIS:  Right proximal humerus fracture  PROCEDURE:  Procedure(s) with comments: OPEN REDUCTION INTERNAL FIXATION (ORIF) PROXIMAL HUMERUS FRACTURE (Right) - 90 mins  SURGEON:  Surgeon(s) and Role:    * Stann Mainland, Elly Modena, MD - Primary  PHYSICIAN ASSISTANT:   ASSISTANTS: Laure Kidney, RNFA   ANESTHESIA:   regional and general  EBL:  25 mL   BLOOD ADMINISTERED:none  DRAINS: none   LOCAL MEDICATIONS USED:  NONE  SPECIMEN:  No Specimen  DISPOSITION OF SPECIMEN:  N/A  COUNTS:  YES  TOURNIQUET:  * No tourniquets in log *  DICTATION: .Note written in EPIC  PLAN OF CARE: Admit to inpatient   PATIENT DISPOSITION:  PACU - hemodynamically stable.   Delay start of Pharmacological VTE agent (>24hrs) due to surgical blood loss or risk of bleeding: not applicable

## 2018-09-21 NOTE — Anesthesia Postprocedure Evaluation (Signed)
Anesthesia Post Note  Patient: Set designer  Procedure(s) Performed: OPEN REDUCTION INTERNAL FIXATION (ORIF) PROXIMAL HUMERUS FRACTURE (Right Arm Upper)     Patient location during evaluation: PACU Anesthesia Type: General Level of consciousness: awake and alert and oriented Pain management: pain level controlled Vital Signs Assessment: post-procedure vital signs reviewed and stable Respiratory status: spontaneous breathing, nonlabored ventilation and respiratory function stable Cardiovascular status: blood pressure returned to baseline and stable Postop Assessment: no apparent nausea or vomiting Anesthetic complications: no    Last Vitals:  Vitals:   09/21/18 1110 09/21/18 1125  BP: (!) 137/52 (!) 140/53  Pulse: 72 72  Resp: 15 15  Temp:  36.6 C  SpO2: 98% 100%    Last Pain:  Vitals:   09/21/18 1125  TempSrc:   PainSc: 0-No pain                 Korie Streat A.

## 2018-09-21 NOTE — Anesthesia Procedure Notes (Signed)
Anesthesia Regional Block: Supraclavicular block   Pre-Anesthetic Checklist: ,, timeout performed, Correct Patient, Correct Site, Correct Laterality, Correct Procedure, Correct Position, site marked, Risks and benefits discussed,  Surgical consent,  Pre-op evaluation,  At surgeon's request and post-op pain management  Laterality: Right  Prep: chloraprep       Needles:  Injection technique: Single-shot  Needle Type: Echogenic Stimulator Needle     Needle Length: 9cm  Needle Gauge: 21   Needle insertion depth: 5 cm   Additional Needles:   Procedures:,,,, ultrasound used (permanent image in chart),,,,  Narrative:  Start time: 09/21/2018 8:27 AM End time: 09/21/2018 8:32 AM Injection made incrementally with aspirations every 5 mL.  Performed by: Personally  Anesthesiologist: Josephine Igo, MD  Additional Notes: Timeout performed. Patient sedated. Relevant anatomy ID'd using [place]. Incremental 2-70ml injection of LA with frequent aspiration. Patient tolerated procedure well.        Right Supraclavicular Block

## 2018-09-21 NOTE — Anesthesia Preprocedure Evaluation (Addendum)
Anesthesia Evaluation  Patient identified by MRN, date of birth, ID band Patient awake    Reviewed: Allergy & Precautions, NPO status , Patient's Chart, lab work & pertinent test results  Airway Mallampati: II  TM Distance: >3 FB Neck ROM: Full    Dental no notable dental hx. (+) Upper Dentures, Partial Lower, Dental Advisory Given   Pulmonary asthma , pneumonia, resolved,    Pulmonary exam normal breath sounds clear to auscultation       Cardiovascular hypertension, Pt. on medications + Peripheral Vascular Disease, +CHF and + DVT  Normal cardiovascular exam+ Valvular Problems/Murmurs  Rhythm:Regular Rate:Normal + Systolic murmurs Hx/o DVT 5 years ago   Neuro/Psych  Headaches, Restless legs syndrome negative psych ROS   GI/Hepatic Neg liver ROS, GERD  Medicated and Controlled,  Endo/Other  Hyperlipidemia S/P bilateral mastectomies for breast Ca  Renal/GU Renal InsufficiencyRenal disease  negative genitourinary   Musculoskeletal  (+) Arthritis , Osteoarthritis and Rheumatoid disorders,  Osteoporosis Right proximal humerus Fx   Abdominal   Peds  Hematology  (+) anemia , Xarelto- last dose 09/18/2018   Anesthesia Other Findings   Reproductive/Obstetrics                           Anesthesia Physical Anesthesia Plan  ASA: III  Anesthesia Plan: General   Post-op Pain Management:  Regional for Post-op pain   Induction: Intravenous  PONV Risk Score and Plan: 4 or greater and Ondansetron and Treatment may vary due to age or medical condition  Airway Management Planned: Oral ETT  Additional Equipment:   Intra-op Plan:   Post-operative Plan: Extubation in OR  Informed Consent: I have reviewed the patients History and Physical, chart, labs and discussed the procedure including the risks, benefits and alternatives for the proposed anesthesia with the patient or authorized representative who  has indicated his/her understanding and acceptance.     Dental advisory given  Plan Discussed with: CRNA and Surgeon  Anesthesia Plan Comments:         Anesthesia Quick Evaluation

## 2018-09-21 NOTE — H&P (Signed)
ORTHOPAEDIC H and P  REQUESTING PHYSICIAN: Nicholes Stairs, MD  PCP:  Crist Infante, MD  Chief Complaint: Right shoulder pain  HPI: [name] is a 85 y.o. female who complains of right shoulder pain.  She is a right-hand-dominant individual that had a ground-level fall on April 28.  She was found to have a proximal humerus fracture of the right.  On consultation in my office we evaluated the injury and noted that the greater tuberosity fragment was very displaced.  We recommended open reduction and internal fixation of this fragment.  She presents today for that surgery.  She has no new complaints today.  She continues to have shoulder pain but denies numbness or tingling.  She denies shortness of breath or fevers or cough.  She does live independently with her husband.  Past Medical History:  Diagnosis Date  . Anemia    after hysterectomy  . Asthma, severe persistent    pulmologist-- dr Lynford Citizen  . Cancer [facility])    Breast cancer on right  . Chronic kidney disease, stage II (mild)   . DDD (degenerative disc disease), cervical   . Diverticulitis   . Diverticulosis yrs ago   hx of   . Edema, lower extremity    occ both legs swell  . GERD (gastroesophageal reflux disease)   . Headache    sinus headaches  . Heart murmur    no problems - per pt  . History of breast cancer 1990 left mastectomy also   1989  S/P   RIGHT MASTECTOMY ;  NO CHEMORADIATION //   NO RECURRENCE  . History of DVT of lower extremity 5 yrs ago   right leg  . History of shingles   . Hyperlipidemia   . Internal hemorrhoid   . Leg ulcer, left ([place])   . Osteoporosis, unspecified    Knee and hip osteoarthritis bilaterally  . Perennial allergic rhinitis   . Peripheral vascular disease ([place])   . Pneumonia   . Restless legs syndrome (RLS)   . Rheumatoid arthritis ([place])    hands  . Thyroid nodule    followed by dr perrini yearly, no current problam  . Unspecified essential hypertension    Past  Surgical History:  Procedure Laterality Date  . CARDIAC CATHETERIZATION  08/ 01/ 2008   dr Cathie Olden   normal -- EF of 65%  . CARDIOVASCULAR STRESS TEST  05-22-2011   dr Cathie Olden   normal lexiscan perfusion study/  no ischemia/  lvsf  86%  . CATARACT EXTRACTION W/ INTRAOCULAR LENS  IMPLANT, BILATERAL    . CHOLECYSTECTOMY  1993   laparoscopic  . CONVERSION TO TOTAL HIP Left 12/03/2017   Procedure: CONVERSION TO LEFT TOTAL HIP;  Surgeon: Paralee Cancel, MD;  Location: [facility];  Service: Orthopedics;  Laterality: Left;  90 mins  . HIP ARTHROPLASTY Left 12/22/2016   Procedure: HEMIARTHROPLASTY, LEFT;  Surgeon: Paralee Cancel, MD;  Location: [facility];  Service: Orthopedics;  Laterality: Left;  . INCISION AND DRAINAGE OF WOUND Left 10/24/2013   Procedure: IRRIGATION AND DEBRIDEMENT OF LEFT LEG WITH PLACEMENT OF ACELL AND WOUND VAC;  Surgeon: Theodoro Kos, DO;  Location: [place];  Service: Plastics;  Laterality: Left;  . [place]  . MASTECTOMY Bilateral rigth 1989///   left  1990   breast cancer 1989//   fibrocytic disease 1990  . TOTAL ABDOMINAL HYSTERECTOMY W/ BILATERAL SALPINGOOPHORECTOMY  1989   done at  same time as right mastectomy  . TOTAL KNEE ARTHROPLASTY Right 10/05/2012   Procedure: RIGHT TOTAL KNEE ARTHROPLASTY;  Surgeon: Mauri Pole, MD;  Location: [facility];  Service: Orthopedics;  Laterality: Right;   Social History   Socioeconomic History  . Marital status: Married    Spouse name: Not on file  . Number of children: Not on file  . Years of education: Not on file  . Highest education level: Not on file  Occupational History  . Not on file  Social Needs  . Financial resource strain: Not on file  . Food insecurity:    Worry: Not on file    Inability: Not on file  . Transportation needs:    Medical: Not on file    Non-medical: Not on file  Tobacco Use  . Smoking status: Never Smoker  . Smokeless tobacco: Never Used  Substance and Sexual  Activity  . Alcohol use: No  . Drug use: No  . Sexual activity: Not on file  Lifestyle  . Physical activity:    Days per week: Not on file    Minutes per session: Not on file  . Stress: Not on file  Relationships  . Social connections:    Talks on phone: Not on file    Gets together: Not on file    Attends religious service: Not on file    Active member of club or organization: Not on file    Attends meetings of clubs or organizations: Not on file    Relationship status: Not on file  Other Topics Concern  . Not on file  Social History Narrative   Originally from [place]. Always lived in [place]. Has traveled across the country to [place]. 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 No known asbestos exposure (brother with mesothelioma). No pets currently. No bird exposure. No mold exposure. Carpet has been removed from her home.    Family History  Problem Relation Age of Onset  . Heart attack Mother   . Asthma Mother   . Heart disease Mother   . Hyperlipidemia Mother   . Heart attack Father   . Heart failure Father   . Deep vein thrombosis Father   . Heart disease Father   . Peripheral vascular disease Father        amputation  . Cancer Brother   . Asthma Brother   . Coronary artery disease Sister   . Heart disease Sister    Allergies  Allergen Reactions  . Eggs Or Egg-Derived Products     UNSPECIFIED REACTION  RAW EGGS.. Can eat cooked eggs  . Influenza Vaccines     UNSPECIFIED REACTION  Egg allergy   . Nucala [Mepolizumab]     UNSPECIFIED REACTION   . Cephalosporins Rash  . Codeine Rash  . Gabapentin Nausea Only  . Levofloxacin Rash  . Pneumococcal Vaccines Rash   Prior to Admission medications   Medication Sig Start Date End Date Taking? Authorizing Provider  acetaminophen (TYLENOL) 500 MG tablet Take 2 tablets (1,000 mg total) by mouth every 8 (eight) hours. Patient taking differently: Take 1,000 mg by mouth every 6 (six) hours as  needed (pain).  12/03/17  Yes Danae Orleans, PA-C  albuterol (PROAIR HFA) 108 (90 Base) MCG/ACT inhaler Inhale 2 puffs into the lungs every 6 (six) hours as needed for wheezing or shortness of breath. 06/15/17  Yes Brand Males, MD  albuterol (PROVENTIL) (2.5 MG/3ML) 0.083% nebulizer solution Take 3 mLs (  2.5 mg total) by nebulization every 6 (six) hours as needed for wheezing or shortness of breath. Dx: J45.41 09/03/17  Yes Brand Males, MD  benzonatate (TESSALON) 100 MG capsule Take 1 capsule (100 mg total) by mouth 3 (three) times daily as needed for cough. 12/02/17  Yes Brand Males, MD  Calcium Carbonate-Vitamin D (CALTRATE 600+D) 600-400 MG-UNIT per tablet Take 1 tablet by mouth daily.    Yes [provider]  chlorpheniramine (CHLOR-TRIMETON) 4 MG tablet Take 4-8 mg by mouth See admin instructions. 4 mg in the morning and 8 mg at bedtime  01/12/13  Yes Elsie Stain, MD  Cholecalciferol (VITAMIN D3) 1000 UNITS tablet Take 1,000 Units by mouth every evening.    Yes [provider]  dextromethorphan (DELSYM) 30 MG/5ML liquid Take 60 mg by mouth 2 (two) times daily as needed for cough.   Yes [provider]  dextromethorphan-guaiFENesin (MUCINEX DM) 30-600 MG 12hr tablet Take 1 tablet by mouth daily as needed for cough.   Yes [provider]  Dupilumab (DUPIXENT) 300 MG/2ML SOSY Inject 300 mg into the skin every 14 (fourteen) days. 10/19/17  Yes Brand Males, MD  fexofenadine (ALLEGRA) 180 MG tablet Take 180 mg by mouth daily.   12/02/10  Yes Elsie Stain, MD  furosemide (LASIX) 20 MG tablet Take 20 mg by mouth daily.   Yes [provider]  ibandronate (BONIVA) 150 MG tablet Take 1 tablet by mouth every 30 (thirty) days.  03/31/18  Yes [provider]  mometasone (NASONEX) 50 MCG/ACT nasal spray Place 2 sprays into the nose daily. Patient taking differently: Place 2 sprays into the nose 2 (two) times a day.  12/02/17  Yes  Brand Males, MD  montelukast (SINGULAIR) 10 MG tablet TAKE 1 TABLET BY MOUTH EVERY DAY AT BEDTIME Patient taking differently: Take 10 mg by mouth at bedtime.  08/12/18  Yes Brand Males, MD  Multiple Vitamin (MULTIVITAMIN WITH MINERALS) TABS tablet Take 1 tablet by mouth daily. Centrum Silver Multivitamin   Yes [provider]  NEXIUM 40 MG capsule TAKE 1 CAPSULE (40 MG TOTAL) BY MOUTH 2 (TWO) TIMES DAILY. NAME BRAND ONLY 03/30/18  Yes Brand Males, MD  potassium chloride (K-DUR) 10 MEQ tablet TAKE 1 TABLET BY MOUTH EVERY DAY Patient taking differently: Take 10 mEq by mouth daily.  04/26/18  Yes Nahser, Wonda Cheng, MD  pramipexole (MIRAPEX) 0.125 MG tablet Take 0.375 mg by mouth at bedtime.    Yes [provider]  rosuvastatin (CRESTOR) 5 MG tablet TAKE 1 TABLET BY MOUTH EVERYDAY AT BEDTIME Patient taking differently: Take 5 mg by mouth at bedtime.  05/03/18  Yes Nahser, Wonda Cheng, MD  sennosides-docusate sodium (SENOKOT-S) 8.6-50 MG tablet Take 1 tablet by mouth daily as needed for constipation.   Yes [provider]  SYMBICORT 160-4.5 MCG/ACT inhaler TAKE 2 PUFFS BY MOUTH TWICE A DAY Patient taking differently: Inhale 2 puffs into the lungs 2 (two) times a day.  05/28/18  Yes Brand Males, MD  telmisartan (MICARDIS) 20 MG tablet TAKE 1 TABLET BY MOUTH EVERYDAY AT BEDTIME Patient taking differently: Take 20 mg by mouth every evening.  04/30/18  Yes Nahser, Wonda Cheng, MD  Tiotropium Bromide Monohydrate (SPIRIVA RESPIMAT) 2.5 MCG/ACT AERS Inhale 2 puffs into the lungs daily. 12/02/17  Yes Brand Males, MD  traMADol (ULTRAM) 50 MG tablet Take 1 tablet (50 mg total) by mouth every 6 (six) hours as needed. Patient taking differently: Take 50 mg by mouth  every 6 (six) hours as needed (pain).  12/03/17 12/03/18 Yes Babish, Rodman Key, PA-C  vitamin B-12 (CYANOCOBALAMIN) 500 MCG tablet Take 500 mcg by mouth daily.     Yes [provider]  XARELTO 20 MG  TABS tablet Take 20 mg by mouth daily with supper.    Yes [provider]  doxycycline (VIBRA-TABS) 100 MG tablet Take 1 tablet (100 mg total) by mouth 2 (two) times daily. Patient not taking: Reported on 09/16/2018 06/08/18   Brand Males, MD  morphine (MSIR) 15 MG tablet Take 1 tablet (15 mg total) by mouth every 4 (four) hours as needed for severe pain. Patient not taking: Reported on 09/16/2018 09/07/18   Deno Etienne, DO  predniSONE (DELTASONE) 10 MG tablet Take 4tabsx2days,2tabsx2days,1tabx2days,0.5tabx2days,then stop Patient not taking: Reported on 07/27/2018 06/08/18   Brand Males, MD  Spacer/Aero-Holding Chambers (AEROCHAMBER MV) inhaler by Other route. Use as instructed     [provider]   No results found.  Positive ROS: All other systems have been reviewed and were otherwise negative with the exception of those mentioned in the HPI and as above.  Physical Exam: General: Alert, no acute distress Cardiovascular: No pedal edema Respiratory: No cyanosis, no use of accessory musculature GI: No organomegaly, abdomen is soft and non-tender Skin: No lesions in the area of chief complaint Neurologic: Sensation intact distally Psychiatric: Patient is competent for consent with normal mood and affect Lymphatic: No axillary or cervical lymphadenopathy  MUSCULOSKELETAL:  Pain and bruising along the right axilla of the shoulder.  The wrist and elbow are clean, dry and intact with no pain.  Neurovascular intact throughout.  Assessment: Right shoulder displaced greater tuberosity fracture  Plan: -Our plan for her is for open reduction and internal fixation.  We will admit postoperatively for observation and pain control.  Anticipate discharge home tomorrow.  -We again reviewed the risk, benefits, and indications of this procedure at length.  We discussed the risk of bleeding, infection, damage to surrounding neurovascular structures, the risk of anesthesia, the risk  of blood clots, and the risk of postoperative hardware failure.  She has provided informed consent.    Nicholes Stairs, MD Cell [phone]    09/21/2018 7:20 AM

## 2018-09-21 NOTE — Op Note (Signed)
09/21/2018  10:43 AM  PATIENT:  [name]    PRE-OPERATIVE DIAGNOSIS:  Right proximal humerus fracture  POST-OPERATIVE DIAGNOSIS:  Same  PROCEDURE:  OPEN REDUCTION INTERNAL FIXATION (ORIF) PROXIMAL HUMERUS FRACTURE  SURGEON:  Nicholes Stairs, MD  ASSISTANT: Laure Kidney, RNFA  ANESTHESIA:   General  PREOPERATIVE INDICATIONS:  [name] is a  85 y.o. female with a diagnosis of Right proximal humerus fracture who elected for surgical management.  During a fall approximately 2 weeks ago she sustained a greater than 2 cm displaced greater tuberosity fracture.  There was no surgical neck fracture.  We did discuss operative fixation to optimize her function in that arm as the amount of displacement was certainly going to create a malunion and deficits in function.  The risks benefits and alternatives were discussed with the patient including but not limited to the risks of nonoperative treatment, versus surgical intervention including infection, bleeding, nerve injury, malunion, nonunion, the need for revision surgery, hardware prominence, hardware failure, the need for hardware removal, blood clots, cardiopulmonary complications, conversion to arthroplasty, morbidity, mortality, among others, and they were willing to proceed.  Predicted outcome is good, although there will be at least a six to nine month expected recovery.   OPERATIVE IMPLANTS:  Zimmer Ventix 4.75 knotless anchor x 2 with 3 Biomet tape sutures in horizontal mattress fashion at the bone to tendon junction medially    OPERATIVE FINDINGS: Displaced proximal humerus fracture of the greater tuberosity.  UNIQUE ASPECTS OF THE CASE:   The greater tuberosity fracture fragment was displaced posterior and superiorly.  Once fracture hematoma was debrided we noted approximately 2-1/2 cm of posterior displacement and 1-1/2 cm of superior migration.  Of note the superior posterior cuff tendon quality was quite good.  She  did have some calcific tendinopathy noted in the anterior aspect of the supraspinatus.  The subscapularis was noted to be completely intact.  The surgical neck of the humerus was likewise intact.  The tuberosity fracture fragment was of poor bone quality and had some comminution.  There was concern for placing any screw fixation through this fragment but it may become more fragmented.  Therefore we elected to do a suture anchor construct that did provide excellent stability and adequately reduced the fracture fragment into the fracture bed.  OPERATIVE PROCEDURE: The patient was brought to the operating room and placed in the supine position. General anesthesia was administered. IV antibiotics were given. She was placed in the beach chair position. All bony prominences were padded. The upper extremity was prepped and draped in usual sterile fashion. Deltopectoral incision was performed.  I exposed the fracture site, and placed deep retractors.  The greater tuberosity fracture was debrided of any fibrous nonunion and fracture hematoma.  This was mobilized with deep deltoid releases.  At this juncture we noted that the bone quality was quite poor of the fracture fragment.  There was concern for placing any screws into this that they may create more displacement and fragmentation.  Therefore we elected to do a suture anchor construct.  We passed 3 horizontal mattress and 1 Mason-Allen style suture medial to the fracture fragment at the tendon to bone junction.  These proved to have good grasping quality and we were able to mobilize the fragment quite well with this.  Next, we exposed to the intertubercular groove.  We felt this would provide the best bone fixation for suture anchors.  We did tenotomized the biceps in situ to allow for access  to the floor of the biceps groove.  Next we placed an inferior and superior knotless anchor with the Zimmer Ventix 4.48mm anchor.  These were then passed to the suture anchors  in a criss-cross fashion to achieve good bone compression.  We tensioned the sutures prior to securing the anchor.  This provided good bone compression through the fracture site into the fracture bed.  The loose ends of the sutures were cut and the fracture fragment was noted to have good mobility and moved as 1 unit.    Once complete fixation and reduction of been achieved, took final C-arm pictures, and irrigated the wounds copiously, and repaired the deltopectoral interval with Vicryl followed by Vicryl for the subcutaneous tissue with staples for the skin. She was placed in a sling. She had a preoperative regional block as well. She tolerated the procedure well with no complications.  All counts were correct.  Disposition: [name] will be transferred to the floor for overnight observation.  She will work with occupational therapy tomorrow morning and hopefully discharge home tomorrow.  She is nonweightbearing to the right upper extremity.  She may discontinue the sling at her leisure.

## 2018-09-22 ENCOUNTER — Encounter (HOSPITAL_COMMUNITY): Payer: Self-pay | Admitting: Orthopedic Surgery

## 2018-09-22 DIAGNOSIS — I130 Hypertensive heart and chronic kidney disease with heart failure and stage 1 through stage 4 chronic kidney disease, or unspecified chronic kidney disease: Secondary | ICD-10-CM | POA: Diagnosis not present

## 2018-09-22 DIAGNOSIS — I129 Hypertensive chronic kidney disease with stage 1 through stage 4 chronic kidney disease, or unspecified chronic kidney disease: Secondary | ICD-10-CM | POA: Diagnosis not present

## 2018-09-22 DIAGNOSIS — I509 Heart failure, unspecified: Secondary | ICD-10-CM | POA: Diagnosis not present

## 2018-09-22 DIAGNOSIS — N182 Chronic kidney disease, stage 2 (mild): Secondary | ICD-10-CM | POA: Diagnosis not present

## 2018-09-22 DIAGNOSIS — S42251A Displaced fracture of greater tuberosity of right humerus, initial encounter for closed fracture: Secondary | ICD-10-CM | POA: Diagnosis not present

## 2018-09-22 DIAGNOSIS — Z853 Personal history of malignant neoplasm of breast: Secondary | ICD-10-CM | POA: Diagnosis not present

## 2018-09-22 MED ORDER — HYDROCODONE-ACETAMINOPHEN 5-325 MG PO TABS: 1.0000 | ORAL_TABLET | ORAL | 0 refills | Status: DC | PRN

## 2018-09-22 NOTE — Evaluation (Signed)
Occupational Therapy Evaluation Patient Details Name: [name] MRN: [id number] DOB: 09-03-1932 Today's Date: 09/22/2018    History of Present Illness 85 yo female s/p R ORIF PMH:  has a past medical history of Anemia, Asthma, severe persistent, Cancer ([place]), Chronic kidney disease, stage II (mild), DDD (degenerative disc disease), cervical, Diverticulitis, Diverticulosis (yrs ago), Edema, lower extremity, GERD (gastroesophageal reflux disease), Headache, Heart murmur, History of breast cancer (1990 left mastectomy also), History of DVT of lower extremity (5 yrs ago), History of shingles, Hyperlipidemia, Internal hemorrhoid, Leg ulcer, left ([place]), Osteoporosis, unspecified, Perennial allergic rhinitis, Peripheral vascular disease ([place]), Pneumonia, Restless legs syndrome (RLS), Rheumatoid arthritis ([place]), Thyroid nodule, and Unspecified essential hypertension.    Clinical Impression   Patient evaluated by Occupational Therapy with no further acute OT needs identified. All education has been completed and the patient has no further questions. See below for any follow-up Occupational Therapy or equipment needs. OT to sign off. Thank you for referral.      Follow Up Recommendations  No OT follow up    Equipment Recommendations  None recommended by OT    Recommendations for Other Services       Precautions / Restrictions Precautions Precautions: Shoulder Type of Shoulder Precautions: PROM FF90 external rotation 0-30 no abduction Shoulder Interventions: At all times Precaution Comments: post surgicial shoulder handout provided and reviewed in detail. education on pendulum  Required Braces or Orthoses: Sling Restrictions Weight Bearing Restrictions: No      Mobility Bed Mobility               General bed mobility comments: in chair on arrival  Transfers Overall transfer level: Needs assistance   Transfers: Sit to/from Stand Sit to Stand: Min guard               Balance                                           ADL either performed or assessed with clinical judgement   ADL Overall ADL's : Needs assistance/impaired Eating/Feeding: Independent   Grooming: Wash/dry hands   Upper Body Bathing: Minimal assistance   Lower Body Bathing: Minimal assistance                         General ADL Comments: pt educated on shoulder sling, positioning for sleeping, dangled R arm in sitting. educated on pendulums, and dressing R UE first. pt able to tolerate dangle at this time. pt doesnt feel she can pendulum so education on how with demonstration proviided.     Vision Baseline Vision/History: Wears glasses Wears Glasses: At all times       Perception     Praxis      Pertinent Vitals/Pain Pain Assessment: Faces Faces Pain Scale: Hurts even more Pain Location: R shoulder Pain Descriptors / Indicators: Operative site guarding Pain Intervention(s): Monitored during session;Patient requesting pain meds-RN notified     Hand Dominance Right   Extremity/Trunk Assessment Upper Extremity Assessment Upper Extremity Assessment: RUE deficits/detail RUE Deficits / Details: s/p surg   Lower Extremity Assessment Lower Extremity Assessment: Overall WFL for tasks assessed(hx of L hip surg)   Cervical / Trunk Assessment Cervical / Trunk Assessment: Kyphotic   Communication Communication Communication: No difficulties   Cognition Arousal/Alertness: Awake/alert Behavior During Therapy: WFL for tasks assessed/performed Overall Cognitive Status: Within Functional Limits  for tasks assessed                                     General Comments  dressing intact and sling in position with pillows supporting arm    Exercises     Shoulder Instructions      Home Living Family/patient expects to be discharged to:: Private residence Living Arrangements: Spouse/significant other Available Help at Discharge:  Family Type of Home: House Home Access: Stairs to enter Technical brewer of Steps: 2 Entrance Stairs-Rails: Right Home Layout: One level         Bathroom Toilet: Handicapped height Bathroom Accessibility: Yes   Home Equipment: Environmental consultant - 2 wheels;Bedside commode;Tub bench;Cane - single point   Additional Comments: reacher       Prior Functioning/Environment Level of Independence: Independent with assistive device(s)        Comments: used cane intermittently         OT Problem List:        OT Treatment/Interventions:      OT Goals(Current goals can be found in the care plan section) Acute Rehab OT Goals Patient Stated Goal: to go home today  OT Frequency:     Barriers to D/C:            Co-evaluation              AM-PAC OT "6 Clicks" Daily Activity     Outcome Measure Help from another person eating meals?: None Help from another person taking care of personal grooming?: None Help from another person toileting, which includes using toliet, bedpan, or urinal?: A Little Help from another person bathing (including washing, rinsing, drying)?: A Little Help from another person to put on and taking off regular upper body clothing?: None Help from another person to put on and taking off regular lower body clothing?: A Little 6 Click Score: 21   End of Session Nurse Communication: Mobility status;Precautions  Activity Tolerance: Patient tolerated treatment well Patient left: in chair;with call bell/phone within reach  OT Visit Diagnosis: Muscle weakness (generalized) (M62.81)                Time: 4098-1191 OT Time Calculation (min): 23 min Charges:  OT General Charges $OT Visit: 1 Visit OT Evaluation $OT Eval Moderate Complexity: 1 Mod   Jeri Modena, OTR/L  Acute Rehabilitation Services Pager: [phone] Office: [phone] .   Jeri Modena 09/22/2018, 3:22 PM

## 2018-09-22 NOTE — Progress Notes (Signed)
Patient discharging home. Discharge instructions explained to patient and she verbalized understanding. Took all personal belongings. No further questions or concerns voiced.  

## 2018-09-22 NOTE — Plan of Care (Signed)
  Problem: Education: Goal: Knowledge of General Education information will improve Description Including pain rating scale, medication(s)/side effects and non-pharmacologic comfort measures Outcome: Progressing   Problem: Health Behavior/Discharge Planning: Goal: Ability to manage health-related needs will improve Outcome: Progressing   Problem: Clinical Measurements: Goal: Ability to maintain clinical measurements within normal limits will improve Outcome: Progressing Goal: Will remain free from infection Outcome: Progressing Goal: Diagnostic test results will improve Outcome: Progressing Goal: Respiratory complications will improve Outcome: Progressing   Problem: Activity: Goal: Risk for activity intolerance will decrease Outcome: Progressing   Problem: Nutrition: Goal: Adequate nutrition will be maintained Outcome: Progressing   Problem: Coping: Goal: Level of anxiety will decrease Outcome: Progressing   Problem: Elimination: Goal: Will not experience complications related to bowel motility Outcome: Progressing Goal: Will not experience complications related to urinary retention Outcome: Progressing   Problem: Pain Managment: Goal: General experience of comfort will improve Outcome: Progressing   Problem: Safety: Goal: Ability to remain free from injury will improve Outcome: Progressing   Problem: Skin Integrity: Goal: Risk for impaired skin integrity will decrease Outcome: Progressing   Problem: Clinical Measurements: Goal: Postoperative complications will be avoided or minimized Outcome: Progressing   Problem: Skin Integrity: Goal: Demonstration of wound healing without infection will improve Outcome: Progressing

## 2018-09-22 NOTE — Discharge Summary (Signed)
Patient ID: [name] MRN: [id number] DOB/AGE: 09/07/1932 85 y.o.  Admit date: 09/21/2018 Discharge date:   Primary Diagnosis: Right proximal humerus fracture Admission Diagnoses:  Past Medical History:  Diagnosis Date   Anemia    after hysterectomy   Asthma, severe persistent    pulmologist-- dr Lynford Citizen   Cancer [facility])    Breast cancer on right   Chronic kidney disease, stage II (mild)    DDD (degenerative disc disease), cervical    Diverticulitis    Diverticulosis yrs ago   hx of    Edema, lower extremity    occ both legs swell   GERD (gastroesophageal reflux disease)    Headache    sinus headaches   Heart murmur    no problems - per pt   History of breast cancer 1990 left mastectomy also   1989  S/P   RIGHT MASTECTOMY ;  NO CHEMORADIATION //   NO RECURRENCE   History of DVT of lower extremity 5 yrs ago   right leg   History of shingles    Hyperlipidemia    Internal hemorrhoid    Leg ulcer, left (HCC)    Osteoporosis, unspecified    Knee and hip osteoarthritis bilaterally   Perennial allergic rhinitis    Peripheral vascular disease (HCC)    Pneumonia    Restless legs syndrome (RLS)    Rheumatoid arthritis ([place])    hands   Thyroid nodule    followed by dr perrini yearly, no current problam   Unspecified essential hypertension    Discharge Diagnoses:   Active Problems:   Displaced fracture of greater tuberosity of right humerus, initial encounter for closed fracture   Closed fracture of right proximal humerus  Estimated body mass index is 26.63 kg/m as calculated from the following:   Height as of this encounter: 5\' 5"  (1.651 m).   Weight as of this encounter: 72.6 kg.  Procedure:  Procedure(s) (LRB): OPEN REDUCTION INTERNAL FIXATION (ORIF) PROXIMAL HUMERUS FRACTURE (Right)   Consults: None  HPI: [name] sustained a ground-level fall resulting in a right proximal humerus, greater tuberosity, fracture with displacement.   She was indicated for open reduction and internal fixation. Laboratory Data: Admission on 09/21/2018  Component Date Value Ref Range Status   Sodium 09/21/2018 137  135 - 145 mmol/L Final   Potassium 09/21/2018 3.5  3.5 - 5.1 mmol/L Final   Chloride 09/21/2018 100  98 - 111 mmol/L Final   CO2 09/21/2018 26  22 - 32 mmol/L Final   Glucose, Bld 09/21/2018 84  70 - 99 mg/dL Final   BUN 09/21/2018 15  8 - 23 mg/dL Final   Creatinine, Ser 09/21/2018 1.49* 0.44 - 1.00 mg/dL Final   Calcium 09/21/2018 9.2  8.9 - 10.3 mg/dL Final   GFR calc non Af Amer 09/21/2018 32* >60 mL/min Final   GFR calc Af Amer 09/21/2018 37* >60 mL/min Final   Anion gap 09/21/2018 11  5 - 15 Final   Performed at [facility], [place] [address], [place], [place] [address]   WBC 09/21/2018 4.4  4.0 - 10.5 K/uL Final   RBC 09/21/2018 3.74* 3.87 - 5.11 MIL/uL Final   Hemoglobin 09/21/2018 10.8* 12.0 - 15.0 g/dL Final   HCT 09/21/2018 34.8* 36.0 - 46.0 % Final   MCV 09/21/2018 93.0  80.0 - 100.0 fL Final   MCH 09/21/2018 28.9  26.0 - 34.0 pg Final   MCHC 09/21/2018 31.0  30.0 - 36.0 g/dL Final  RDW 09/21/2018 13.1  11.5 - 15.5 % Final   Platelets 09/21/2018 391  150 - 400 K/uL Final   nRBC 09/21/2018 0.0  0.0 - 0.2 % Final   Performed at [facility], [place] [address] [place], [place] [address]   Prothrombin Time 09/21/2018 12.9  11.4 - 15.2 seconds Final   INR 09/21/2018 1.0  0.8 - 1.2 Final   Comment: (NOTE) INR goal varies based on device and disease states. Performed at [facility], [place] [address] [place], [place] [address]   Hospital Outpatient Visit on 09/17/2018  Component Date Value Ref Range Status   SARS-CoV-2, NAA 09/17/2018 NOT DETECTED  NOT DETECTED Final   Comment: (NOTE) This test was developed and its performance characteristics determined by [employer]. This test has not been FDA cleared or approved. This test has been authorized by FDA under  an Emergency Use Authorization (EUA). This test is only authorized for the duration of time the declaration that circumstances exist justifying the authorization of the emergency use of in vitro diagnostic tests for detection of SARS-CoV-2 virus and/or diagnosis of COVID-19 infection under section 564(b)(1) of the Act, 21 U.S.C. 010UVO-5(D)(6), unless the authorization is terminated or revoked sooner. When diagnostic testing is negative, the possibility of a false negative result should be considered in the context of a patient's recent exposures and the presence of clinical signs and symptoms consistent with COVID-19. An individual without symptoms of COVID-19 and who is not shedding SARS-CoV-2 virus would expect to have a negative (not detected) result in this assay. Performed                           At: [facility] [place], [place] [id number] Rush Farmer MD VZ:[id number]    Coronavirus Source 09/17/2018 NASOPHARYNGEAL   Final   Performed at [facility], [place] [address] [place],  [address]     X-Rays:Dg Shoulder Right  Result Date: 09/07/2018 CLINICAL DATA:  Right shoulder pain after fall today. EXAM: RIGHT SHOULDER - 2+ VIEW COMPARISON:  None. FINDINGS: Moderately displaced fracture is seen involving the greater tuberosity of the proximal right humerus. No dislocation of the glenohumeral joint is noted. Mild degenerative changes seen involving the distal right acromioclavicular joint. Ribs are unremarkable. IMPRESSION: Moderately displaced fracture is seen involving the greater tuberosity of proximal right humerus. Electronically Signed   By: Marijo Conception M.D.   On: 09/07/2018 15:43   Dg Wrist Complete Right  Result Date: 09/07/2018 CLINICAL DATA:  Right wrist pain after fall today. EXAM: RIGHT WRIST - COMPLETE 3+ VIEW COMPARISON:  None. FINDINGS: There is no evidence of fracture or dislocation. Severe narrowing is seen involving the first  carpometacarpal joint. Chondrocalcinosis of triangular fibrocartilage is noted. Soft tissues are unremarkable. IMPRESSION: Osteoarthritis of the first carpometacarpal joint. No acute abnormality seen in the right wrist. Electronically Signed   By: Marijo Conception M.D.   On: 09/07/2018 15:45   Ct Shoulder Right Wo Contrast  Result Date: 09/09/2018 CLINICAL DATA:  Status post fall 2 days ago. Right shoulder pain and bruising. Nonspecific (abnormal) findings on radiological and other examination of musculoskeletal system. EXAM: CT - CT OF THE RIGHT SHOULDER WITHOUT CONTRAST 3-DIMENSIONAL CT IMAGE RENDERING ON INDEPENDENT WORKSTATION TECHNIQUE: Multiple axial images of the right shoulder with sagittal and coronal reformatted images provided. No intravenous contrast. 3-dimensional CT images were rendered by post-processing of the original  CT data on an independent workstation. The 3-dimensional CT images were interpreted and findings were reported in the accompanying complete CT report for this study COMPARISON:  None. FINDINGS: Bones/Joint/Cartilage Mildly comminuted fracture of the superolateral humeral head involving the greater tuberosity with 14 mm of superior displacement and 10 mm of posterior displacement. No other acute fracture or dislocation. Glenohumeral joint space is maintained. Mild arthropathy of the acromioclavicular joint. No joint effusion. Ligaments Ligaments are suboptimally evaluated by CT. Muscles and Tendons Muscles are normal. No muscle atrophy. No intramuscular fluid collection or hematoma. Calcific tendinosis of the supraspinatus tendon. Soft tissue No fluid collection or hematoma. No soft tissue mass. Numerous small sub 4 mm pulmonary nodules in the right upper lobe and right lower lobe. These are similar in appearance to the prior examination of 07/31/2014. IMPRESSION: 1. Mildly comminuted fracture of the superolateral humeral head involving the greater tuberosity with 14 mm of superior  displacement and 10 mm of posterior displacement. Electronically Signed   By: Kathreen Devoid   On: 09/09/2018 09:16   Ct 3d Independent Darreld Mclean  Result Date: 09/09/2018 CLINICAL DATA:  Status post fall 2 days ago. Right shoulder pain and bruising. Nonspecific (abnormal) findings on radiological and other examination of musculoskeletal system. EXAM: CT - CT OF THE RIGHT SHOULDER WITHOUT CONTRAST 3-DIMENSIONAL CT IMAGE RENDERING ON INDEPENDENT WORKSTATION TECHNIQUE: Multiple axial images of the right shoulder with sagittal and coronal reformatted images provided. No intravenous contrast. 3-dimensional CT images were rendered by post-processing of the original CT data on an independent workstation. The 3-dimensional CT images were interpreted and findings were reported in the accompanying complete CT report for this study COMPARISON:  None. FINDINGS: Bones/Joint/Cartilage Mildly comminuted fracture of the superolateral humeral head involving the greater tuberosity with 14 mm of superior displacement and 10 mm of posterior displacement. No other acute fracture or dislocation. Glenohumeral joint space is maintained. Mild arthropathy of the acromioclavicular joint. No joint effusion. Ligaments Ligaments are suboptimally evaluated by CT. Muscles and Tendons Muscles are normal. No muscle atrophy. No intramuscular fluid collection or hematoma. Calcific tendinosis of the supraspinatus tendon. Soft tissue No fluid collection or hematoma. No soft tissue mass. Numerous small sub 4 mm pulmonary nodules in the right upper lobe and right lower lobe. These are similar in appearance to the prior examination of 07/31/2014. IMPRESSION: 1. Mildly comminuted fracture of the superolateral humeral head involving the greater tuberosity with 14 mm of superior displacement and 10 mm of posterior displacement. Electronically Signed   By: Kathreen Devoid   On: 09/09/2018 09:16   Dg Humerus Right  Result Date: 09/21/2018 CLINICAL DATA:   85 year old female undergoing right humerus ORIF. EXAM: DG C-ARM 61-120 MIN; RIGHT HUMERUS - 2+ VIEW COMPARISON:  CT right shoulder 09/09/2018. FINDINGS: Two intraoperative fluoroscopic spot views of the proximal right humerus demonstrate rotation suggesting preserved glenohumeral joint alignment. The fracture at the greater trochanter is redemonstrated. No hardware placement is included on these images. IMPRESSION: Intraoperative fluoroscopic spot images of the right shoulder. FLUOROSCOPY TIME:  0 minutes 4 seconds Electronically Signed   By: Genevie Ann M.D.   On: 09/21/2018 11:00   Dg C-arm 1-60 Min  Result Date: 09/21/2018 CLINICAL DATA:  85 year old female undergoing right humerus ORIF. EXAM: DG C-ARM 61-120 MIN; RIGHT HUMERUS - 2+ VIEW COMPARISON:  CT right shoulder 09/09/2018. FINDINGS: Two intraoperative fluoroscopic spot views of the proximal right humerus demonstrate rotation suggesting preserved glenohumeral joint alignment. The fracture at the greater trochanter is redemonstrated. No  hardware placement is included on these images. IMPRESSION: Intraoperative fluoroscopic spot images of the right shoulder. FLUOROSCOPY TIME:  0 minutes 4 seconds Electronically Signed   By: Genevie Ann M.D.   On: 09/21/2018 11:00   Dg Hip Unilat W Or Wo Pelvis 2-3 Views Left  Result Date: 09/07/2018 CLINICAL DATA:  [place] today, prior LEFT hip replacement EXAM: DG HIP (WITH OR WITHOUT PELVIS) 2-3V LEFT COMPARISON:  12/19/2016 FINDINGS: Mild osseous demineralization. Components of a LEFT hip prosthesis are identified in expected position. No acute fracture, dislocation or bone destruction. RIGHT hip joint and SI joints appear preserved. Degenerative disc and facet disease changes at visualized lower lumbar spine. IMPRESSION: No acute osseous abnormalities. Electronically Signed   By: Lavonia Dana M.D.   On: 09/07/2018 15:43    EKG: Orders placed or performed in visit on 11/17/17   EKG 12-Lead     Hospital Course:  [name] is a 85 y.o. who was admitted to Hospital. They were brought to the operating room on 09/21/2018 and underwent Procedure(s): OPEN REDUCTION INTERNAL FIXATION (ORIF) PROXIMAL HUMERUS FRACTURE.  Patient tolerated the procedure well and was later transferred to the recovery room and then to the orthopaedic floor for postoperative care.  They were given PO and IV analgesics for pain control following their surgery.  They were given 24 hours of postoperative antibiotics of  Anti-infectives (From admission, onward)   Start     Dose/Rate Route Frequency Ordered Stop   09/21/18 1530  clindamycin (CLEOCIN) IVPB 600 mg     600 mg 100 mL/hr over 30 Minutes Intravenous Every 6 hours 09/21/18 1312 09/22/18 0417   09/21/18 0945  clindamycin (CLEOCIN) IVPB 900 mg     900 mg 100 mL/hr over 30 Minutes Intravenous To ShortStay Surgical 09/21/18 0930 09/21/18 0946   09/21/18 0730  ceFAZolin (ANCEF) IVPB 2g/100 mL premix  Status:  Discontinued     2 g 200 mL/hr over 30 Minutes Intravenous On call to O.R. 09/21/18 0723 09/21/18 1116   09/21/18 0727  ceFAZolin (ANCEF) 2-4 GM/100ML-% IVPB    Note to Pharmacy:  Bobbie Stack   : cabinet override      09/21/18 0727 09/21/18 1313     and started on DVT prophylaxis in the form of Xarelto.   OT was ordered. Discharge planning consulted to help with postop disposition and equipment needs.  Patient had a good night on the evening of surgery.   Incision was healing well.  Patient was seen in rounds and was ready to go home.   Diet: Regular diet Activity:NWB Follow-up:in 2 weeks Disposition - Home Discharged Condition: good   Discharge Instructions    Call MD / Call 911   Complete by:  As directed    If you experience chest pain or shortness of breath, CALL 911 and be transported to the hospital emergency room.  If you develope a fever above 101 F, pus (white drainage) or increased drainage or redness at the wound, or calf pain, call your surgeon's  office.   Constipation Prevention   Complete by:  As directed    Drink plenty of fluids.  Prune juice may be helpful.  You may use a stool softener, such as Colace (over the counter) 100 mg twice a day.  Use MiraLax (over the counter) for constipation as needed.   Diet - low sodium heart healthy   Complete by:  As directed    Increase activity slowly as tolerated   Complete by:  As directed      Allergies as of 09/22/2018      Reactions   Eggs Or Egg-derived Products    UNSPECIFIED REACTION  RAW EGGS.. Can eat cooked eggs   Influenza Vaccines    UNSPECIFIED REACTION  Egg allergy   Nucala [mepolizumab]    UNSPECIFIED REACTION    Cephalosporins Rash   Codeine Rash   Gabapentin Nausea Only   Levofloxacin Rash   Pneumococcal Vaccines Rash      Medication List    TAKE these medications   acetaminophen 500 MG tablet Commonly known as:  TYLENOL Take 2 tablets (1,000 mg total) by mouth every 8 (eight) hours. What changed:    when to take this  reasons to take this   AeroChamber MV inhaler by Other route. Use as instructed   albuterol 108 (90 Base) MCG/ACT inhaler Commonly known as:  ProAir HFA Inhale 2 puffs into the lungs every 6 (six) hours as needed for wheezing or shortness of breath.   albuterol (2.5 MG/3ML) 0.083% nebulizer solution Commonly known as:  PROVENTIL Take 3 mLs (2.5 mg total) by nebulization every 6 (six) hours as needed for wheezing or shortness of breath. Dx: J45.41   benzonatate 100 MG capsule Commonly known as:  TESSALON Take 1 capsule (100 mg total) by mouth 3 (three) times daily as needed for cough.   Caltrate 600+D 600-400 MG-UNIT tablet Generic drug:  Calcium Carbonate-Vitamin D Take 1 tablet by mouth daily.   chlorpheniramine 4 MG tablet Commonly known as:  CHLOR-TRIMETON Take 4-8 mg by mouth See admin instructions. 4 mg in the morning and 8 mg at bedtime   cholecalciferol 25 MCG (1000 UT) tablet Commonly known as:  VITAMIN D Take  1,000 Units by mouth every evening.   dextromethorphan 30 MG/5ML liquid Commonly known as:  DELSYM Take 60 mg by mouth 2 (two) times daily as needed for cough.   dextromethorphan-guaiFENesin 30-600 MG 12hr tablet Commonly known as:  MUCINEX DM Take 1 tablet by mouth daily as needed for cough.   doxycycline 100 MG tablet Commonly known as:  VIBRA-TABS Take 1 tablet (100 mg total) by mouth 2 (two) times daily.   Dupilumab 300 MG/2ML Sosy Commonly known as:  Dupixent Inject 300 mg into the skin every 14 (fourteen) days.   fexofenadine 180 MG tablet Commonly known as:  ALLEGRA Take 180 mg by mouth daily.   furosemide 20 MG tablet Commonly known as:  LASIX Take 20 mg by mouth daily.   HYDROcodone-acetaminophen 5-325 MG tablet Commonly known as:  NORCO/VICODIN Take 1-2 tablets by mouth every 4 (four) hours as needed for moderate pain (pain score 4-6).   ibandronate 150 MG tablet Commonly known as:  BONIVA Take 1 tablet by mouth every 30 (thirty) days.   mometasone 50 MCG/ACT nasal spray Commonly known as:  Nasonex Place 2 sprays into the nose daily. What changed:  when to take this   montelukast 10 MG tablet Commonly known as:  SINGULAIR TAKE 1 TABLET BY MOUTH EVERY DAY AT BEDTIME   morphine 15 MG tablet Commonly known as:  MSIR Take 1 tablet (15 mg total) by mouth every 4 (four) hours as needed for severe pain.   multivitamin with minerals Tabs tablet Take 1 tablet by mouth daily. Centrum Silver Multivitamin   NexIUM 40 MG capsule Generic drug:  esomeprazole TAKE 1 CAPSULE (40 MG TOTAL) BY MOUTH 2 (TWO) TIMES DAILY. NAME BRAND ONLY   potassium chloride 10 MEQ tablet Commonly  known as:  K-DUR TAKE 1 TABLET BY MOUTH EVERY DAY   pramipexole 0.125 MG tablet Commonly known as:  MIRAPEX Take 0.375 mg by mouth at bedtime.   predniSONE 10 MG tablet Commonly known as:  DELTASONE Take 4tabsx2days,2tabsx2days,1tabx2days,0.5tabx2days,then stop   rosuvastatin 5 MG  tablet Commonly known as:  CRESTOR TAKE 1 TABLET BY MOUTH EVERYDAY AT BEDTIME What changed:  See the new instructions.   sennosides-docusate sodium 8.6-50 MG tablet Commonly known as:  SENOKOT-S Take 1 tablet by mouth daily as needed for constipation.   Symbicort 160-4.5 MCG/ACT inhaler Generic drug:  budesonide-formoterol TAKE 2 PUFFS BY MOUTH TWICE A DAY What changed:  See the new instructions.   telmisartan 20 MG tablet Commonly known as:  MICARDIS TAKE 1 TABLET BY MOUTH EVERYDAY AT BEDTIME What changed:  See the new instructions.   Tiotropium Bromide Monohydrate 2.5 MCG/ACT Aers Commonly known as:  Spiriva Respimat Inhale 2 puffs into the lungs daily.   traMADol 50 MG tablet Commonly known as:  Ultram Take 1 tablet (50 mg total) by mouth every 6 (six) hours as needed. What changed:  reasons to take this   vitamin B-12 500 MCG tablet Commonly known as:  CYANOCOBALAMIN Take 500 mcg by mouth daily.   Xarelto 20 MG Tabs tablet Generic drug:  rivaroxaban Take 20 mg by mouth daily with supper.      Follow-up Information    Nicholes Stairs, MD In 2 weeks.   Specialty:  Orthopedic Surgery Why:  For suture removal, For wound re-check Contact information: [address] [place] [address] [phone]           Signed: Geralynn Rile, MD Orthopaedic Surgery 09/22/2018, 12:54 PM

## 2018-09-22 NOTE — Discharge Instructions (Signed)
Maintain sling to right upper extremity for comfort only.  No lifting over 2 pounds. Maintain postoperative bandage until your follow-up appointment in 2 weeks.  This may get wet in the shower but do not submerge underwater. Resume preoperative Xarelto for prevention of blood clots. Apply ice to the right shoulder for 20 to 30 minutes at a time throughout the day.  This should be done at least 4-5 times per day. For mild to moderate pain use Tylenol and/or Advil.  Use Norco for breakthrough pain. Return to see Dr. Stann Mainland in 2 weeks for routine follow-up.

## 2018-09-27 ENCOUNTER — Telehealth: Payer: Self-pay | Admitting: Internal Medicine

## 2018-09-28 ENCOUNTER — Other Ambulatory Visit: Payer: Self-pay

## 2018-09-28 ENCOUNTER — Ambulatory Visit (INDEPENDENT_AMBULATORY_CARE_PROVIDER_SITE_OTHER): Payer: Medicare Other

## 2018-09-28 DIAGNOSIS — J4550 Severe persistent asthma, uncomplicated: Secondary | ICD-10-CM | POA: Diagnosis not present

## 2018-09-28 MED ORDER — DUPILUMAB 300 MG/2ML ~~LOC~~ SOSY
300.0000 mg | PREFILLED_SYRINGE | Freq: Once | SUBCUTANEOUS | Status: AC
  Administered 2018-09-28: 10:00:00 300 mg via SUBCUTANEOUS

## 2018-09-28 NOTE — Progress Notes (Signed)
Have you been hospitalized in the last 10 days? No Do you have a fever? No Do you have a cough? No Do you have a headache or sore throat? No  

## 2018-09-29 NOTE — Telephone Encounter (Signed)
Pt came in on 09/28/2018 to receive her injection. Nothing further is needed.

## 2018-09-30 ENCOUNTER — Telehealth: Payer: Self-pay | Admitting: Internal Medicine

## 2018-09-30 ENCOUNTER — Ambulatory Visit: Payer: Medicare Other | Admitting: Internal Medicine

## 2018-09-30 NOTE — Telephone Encounter (Signed)
Dupixent Shipment Received: 300mg  #1 prefilled syringe Medication arrival date: 09/30/2018 Lot #: [id number] Exp date: 02/2021 Received by: TBS  Pt ordered med.

## 2018-10-11 DIAGNOSIS — M25511 Pain in right shoulder: Secondary | ICD-10-CM | POA: Diagnosis not present

## 2018-10-12 ENCOUNTER — Ambulatory Visit (INDEPENDENT_AMBULATORY_CARE_PROVIDER_SITE_OTHER): Payer: Medicare Other

## 2018-10-12 ENCOUNTER — Other Ambulatory Visit: Payer: Self-pay

## 2018-10-12 DIAGNOSIS — J4550 Severe persistent asthma, uncomplicated: Secondary | ICD-10-CM

## 2018-10-12 MED ORDER — DUPILUMAB 300 MG/2ML ~~LOC~~ SOSY
300.0000 mg | PREFILLED_SYRINGE | Freq: Once | SUBCUTANEOUS | Status: AC
  Administered 2018-10-12: 09:00:00 300 mg via SUBCUTANEOUS

## 2018-10-12 NOTE — Progress Notes (Signed)
Have you been hospitalized within the last 10 days?  No Do you have a fever?  No Do you have a cough?  No Do you have a headache or sore throat? No  

## 2018-10-25 DIAGNOSIS — H40013 Open angle with borderline findings, low risk, bilateral: Secondary | ICD-10-CM | POA: Diagnosis not present

## 2018-10-25 DIAGNOSIS — E119 Type 2 diabetes mellitus without complications: Secondary | ICD-10-CM | POA: Diagnosis not present

## 2018-10-25 DIAGNOSIS — Z961 Presence of intraocular lens: Secondary | ICD-10-CM | POA: Diagnosis not present

## 2018-10-25 DIAGNOSIS — H1851 Endothelial corneal dystrophy: Secondary | ICD-10-CM | POA: Diagnosis not present

## 2018-10-25 DIAGNOSIS — H353131 Nonexudative age-related macular degeneration, bilateral, early dry stage: Secondary | ICD-10-CM | POA: Diagnosis not present

## 2018-10-25 DIAGNOSIS — H04123 Dry eye syndrome of bilateral lacrimal glands: Secondary | ICD-10-CM | POA: Diagnosis not present

## 2018-10-26 ENCOUNTER — Other Ambulatory Visit: Payer: Self-pay

## 2018-10-26 ENCOUNTER — Ambulatory Visit (INDEPENDENT_AMBULATORY_CARE_PROVIDER_SITE_OTHER): Payer: Medicare Other

## 2018-10-26 DIAGNOSIS — M25511 Pain in right shoulder: Secondary | ICD-10-CM | POA: Diagnosis not present

## 2018-10-26 DIAGNOSIS — J4550 Severe persistent asthma, uncomplicated: Secondary | ICD-10-CM | POA: Diagnosis not present

## 2018-10-26 MED ORDER — DUPILUMAB 300 MG/2ML ~~LOC~~ SOSY
300.0000 mg | PREFILLED_SYRINGE | Freq: Once | SUBCUTANEOUS | Status: AC
  Administered 2018-10-26: 10:00:00 300 mg via SUBCUTANEOUS

## 2018-10-26 NOTE — Progress Notes (Signed)
Have you been hospitalized within the last 10 days?  No Do you have a fever?  No Do you have a cough?  Yes dry allergy cough Do you have a headache or sore throat? No

## 2018-10-28 DIAGNOSIS — M25511 Pain in right shoulder: Secondary | ICD-10-CM | POA: Diagnosis not present

## 2018-10-29 ENCOUNTER — Telehealth: Payer: Self-pay | Admitting: Internal Medicine

## 2018-10-29 NOTE — Telephone Encounter (Signed)
Dupixent Shipment Received: 300mg  #1 prefilled syringe Medication arrival date: 10/29/2018 Lot #: [id number] Exp date: 02/2021 Received by: tbs

## 2018-11-02 ENCOUNTER — Telehealth: Payer: Self-pay | Admitting: Internal Medicine

## 2018-11-02 DIAGNOSIS — M25511 Pain in right shoulder: Secondary | ICD-10-CM | POA: Diagnosis not present

## 2018-11-02 MED ORDER — ALBUTEROL SULFATE HFA 108 (90 BASE) MCG/ACT IN AERS: 2.0000 | INHALATION_SPRAY | Freq: Four times a day (QID) | RESPIRATORY_TRACT | 5 refills | Status: DC | PRN

## 2018-11-02 NOTE — Telephone Encounter (Signed)
Refill of pt's proair inhaler has been sent to preferred pharmacy for pt. Called and spoke with pt letting her know this had been done and pt verbalized understanding. Nothing further needed.

## 2018-11-04 ENCOUNTER — Other Ambulatory Visit: Payer: Self-pay | Admitting: Internal Medicine

## 2018-11-04 DIAGNOSIS — M25511 Pain in right shoulder: Secondary | ICD-10-CM | POA: Diagnosis not present

## 2018-11-09 ENCOUNTER — Ambulatory Visit (INDEPENDENT_AMBULATORY_CARE_PROVIDER_SITE_OTHER): Payer: Medicare Other

## 2018-11-09 ENCOUNTER — Other Ambulatory Visit: Payer: Self-pay

## 2018-11-09 ENCOUNTER — Ambulatory Visit: Payer: Medicare Other

## 2018-11-09 DIAGNOSIS — J4550 Severe persistent asthma, uncomplicated: Secondary | ICD-10-CM

## 2018-11-09 DIAGNOSIS — M25511 Pain in right shoulder: Secondary | ICD-10-CM | POA: Diagnosis not present

## 2018-11-09 MED ORDER — DUPILUMAB 300 MG/2ML ~~LOC~~ SOSY
300.0000 mg | PREFILLED_SYRINGE | Freq: Once | SUBCUTANEOUS | Status: AC
  Administered 2018-11-09: 300 mg via SUBCUTANEOUS

## 2018-11-09 NOTE — Progress Notes (Addendum)
Have you been hospitalized within the last 10 days?  No Do you have a fever?  No Do you have a cough?  Yes, her usual. Do you have a headache or sore throat? No   Asked pt questions before inj.[name]

## 2018-11-16 DIAGNOSIS — M25511 Pain in right shoulder: Secondary | ICD-10-CM | POA: Diagnosis not present

## 2018-11-19 DIAGNOSIS — M25511 Pain in right shoulder: Secondary | ICD-10-CM | POA: Diagnosis not present

## 2018-11-23 ENCOUNTER — Ambulatory Visit: Payer: Medicare Other

## 2018-11-23 ENCOUNTER — Other Ambulatory Visit: Payer: Self-pay

## 2018-11-23 ENCOUNTER — Ambulatory Visit (INDEPENDENT_AMBULATORY_CARE_PROVIDER_SITE_OTHER): Payer: Medicare Other

## 2018-11-23 DIAGNOSIS — M25511 Pain in right shoulder: Secondary | ICD-10-CM | POA: Diagnosis not present

## 2018-11-23 DIAGNOSIS — J4550 Severe persistent asthma, uncomplicated: Secondary | ICD-10-CM

## 2018-11-23 MED ORDER — DUPILUMAB 300 MG/2ML ~~LOC~~ SOSY
300.0000 mg | PREFILLED_SYRINGE | Freq: Once | SUBCUTANEOUS | Status: AC
  Administered 2018-11-23: 14:00:00 300 mg via SUBCUTANEOUS

## 2018-11-23 NOTE — Progress Notes (Signed)
Have you been hospitalized within the last 10 days?  No Do you have a fever?  No Do you have a cough?  Yes usual cough Do you have a headache or sore throat? No

## 2018-11-25 DIAGNOSIS — M25511 Pain in right shoulder: Secondary | ICD-10-CM | POA: Diagnosis not present

## 2018-11-26 ENCOUNTER — Telehealth: Payer: Self-pay | Admitting: Internal Medicine

## 2018-11-26 NOTE — Telephone Encounter (Signed)
Dupixent Shipment Received: 300mg  #2 prefilled syringe Medication arrival date: 11/26/2018 Lot #: [id number] Exp date: 02/2021 Received by: Desmond Dike, [place]

## 2018-11-30 DIAGNOSIS — M25511 Pain in right shoulder: Secondary | ICD-10-CM | POA: Diagnosis not present

## 2018-12-02 DIAGNOSIS — M25511 Pain in right shoulder: Secondary | ICD-10-CM | POA: Diagnosis not present

## 2018-12-06 ENCOUNTER — Telehealth: Payer: Self-pay

## 2018-12-06 MED ORDER — EPINEPHRINE 0.3 MG/0.3ML IJ SOAJ: 0.3000 mg | Freq: Once | INTRAMUSCULAR | 1 refills | Status: AC

## 2018-12-06 NOTE — Telephone Encounter (Signed)
Contacted patient as reminder for appt tomorrow 12/07/18 for injection.  Patient requests refill on Epipen due to medication expires this month.  Order sent to [facility] as requested.

## 2018-12-07 ENCOUNTER — Ambulatory Visit (INDEPENDENT_AMBULATORY_CARE_PROVIDER_SITE_OTHER): Payer: Medicare Other

## 2018-12-07 ENCOUNTER — Other Ambulatory Visit: Payer: Self-pay

## 2018-12-07 DIAGNOSIS — J4550 Severe persistent asthma, uncomplicated: Secondary | ICD-10-CM

## 2018-12-07 DIAGNOSIS — M25511 Pain in right shoulder: Secondary | ICD-10-CM | POA: Diagnosis not present

## 2018-12-07 MED ORDER — DUPILUMAB 300 MG/2ML ~~LOC~~ SOSY
300.0000 mg | PREFILLED_SYRINGE | Freq: Once | SUBCUTANEOUS | Status: AC
  Administered 2018-12-07: 300 mg via SUBCUTANEOUS

## 2018-12-07 NOTE — Progress Notes (Signed)
All questions were answered by the patient before medication was administered. Have you been hospitalized in the last 10 days? No Do you have a fever? No Do you have a cough? No Do you have a headache or sore throat? No  

## 2018-12-10 DIAGNOSIS — M25511 Pain in right shoulder: Secondary | ICD-10-CM | POA: Diagnosis not present

## 2018-12-14 DIAGNOSIS — M25511 Pain in right shoulder: Secondary | ICD-10-CM | POA: Diagnosis not present

## 2018-12-16 DIAGNOSIS — Z4789 Encounter for other orthopedic aftercare: Secondary | ICD-10-CM | POA: Diagnosis not present

## 2018-12-16 DIAGNOSIS — M25511 Pain in right shoulder: Secondary | ICD-10-CM | POA: Diagnosis not present

## 2018-12-21 ENCOUNTER — Other Ambulatory Visit: Payer: Self-pay

## 2018-12-21 ENCOUNTER — Ambulatory Visit (INDEPENDENT_AMBULATORY_CARE_PROVIDER_SITE_OTHER): Payer: Medicare Other

## 2018-12-21 DIAGNOSIS — J4550 Severe persistent asthma, uncomplicated: Secondary | ICD-10-CM

## 2018-12-21 DIAGNOSIS — M25511 Pain in right shoulder: Secondary | ICD-10-CM | POA: Diagnosis not present

## 2018-12-21 MED ORDER — DUPILUMAB 300 MG/2ML ~~LOC~~ SOSY
300.0000 mg | PREFILLED_SYRINGE | Freq: Once | SUBCUTANEOUS | Status: AC
  Administered 2018-12-21: 300 mg via SUBCUTANEOUS

## 2018-12-21 NOTE — Progress Notes (Signed)
Have you been hospitalized within the last 10 days?  No Do you have a fever?  No Do you have a cough?  No Do you have a headache or sore throat? No Do you have your Epi Pen visible and is it within date?  Yes 

## 2018-12-22 ENCOUNTER — Other Ambulatory Visit: Payer: Self-pay

## 2018-12-22 ENCOUNTER — Inpatient Hospital Stay (HOSPITAL_COMMUNITY)
Admission: EM | Admit: 2018-12-22 | Discharge: 2018-12-24 | DRG: 313 | Disposition: A | Discharge status: Home / Self Care | Payer: Medicare Other | Attending: Internal Medicine | Admitting: Internal Medicine

## 2018-12-22 ENCOUNTER — Emergency Department (HOSPITAL_COMMUNITY): Payer: Medicare Other

## 2018-12-22 ENCOUNTER — Encounter (HOSPITAL_COMMUNITY): Payer: Self-pay | Admitting: Emergency Medicine

## 2018-12-22 DIAGNOSIS — Z91012 Allergy to eggs: Secondary | ICD-10-CM

## 2018-12-22 DIAGNOSIS — Z9012 Acquired absence of left breast and nipple: Secondary | ICD-10-CM

## 2018-12-22 DIAGNOSIS — Z9841 Cataract extraction status, right eye: Secondary | ICD-10-CM

## 2018-12-22 DIAGNOSIS — R42 Dizziness and giddiness: Secondary | ICD-10-CM | POA: Diagnosis not present

## 2018-12-22 DIAGNOSIS — Z9842 Cataract extraction status, left eye: Secondary | ICD-10-CM

## 2018-12-22 DIAGNOSIS — Z961 Presence of intraocular lens: Secondary | ICD-10-CM | POA: Diagnosis present

## 2018-12-22 DIAGNOSIS — G8929 Other chronic pain: Secondary | ICD-10-CM | POA: Diagnosis present

## 2018-12-22 DIAGNOSIS — I2510 Atherosclerotic heart disease of native coronary artery without angina pectoris: Secondary | ICD-10-CM | POA: Diagnosis not present

## 2018-12-22 DIAGNOSIS — Z79891 Long term (current) use of opiate analgesic: Secondary | ICD-10-CM

## 2018-12-22 DIAGNOSIS — Z7901 Long term (current) use of anticoagulants: Secondary | ICD-10-CM

## 2018-12-22 DIAGNOSIS — R079 Chest pain, unspecified: Secondary | ICD-10-CM

## 2018-12-22 DIAGNOSIS — Z90710 Acquired absence of both cervix and uterus: Secondary | ICD-10-CM

## 2018-12-22 DIAGNOSIS — Z79899 Other long term (current) drug therapy: Secondary | ICD-10-CM

## 2018-12-22 DIAGNOSIS — Z9011 Acquired absence of right breast and nipple: Secondary | ICD-10-CM

## 2018-12-22 DIAGNOSIS — Z853 Personal history of malignant neoplasm of breast: Secondary | ICD-10-CM

## 2018-12-22 DIAGNOSIS — Z8619 Personal history of other infectious and parasitic diseases: Secondary | ICD-10-CM

## 2018-12-22 DIAGNOSIS — R0789 Other chest pain: Principal | ICD-10-CM | POA: Diagnosis present

## 2018-12-22 DIAGNOSIS — Z96642 Presence of left artificial hip joint: Secondary | ICD-10-CM | POA: Diagnosis present

## 2018-12-22 DIAGNOSIS — Z20828 Contact with and (suspected) exposure to other viral communicable diseases: Secondary | ICD-10-CM | POA: Diagnosis not present

## 2018-12-22 DIAGNOSIS — I10 Essential (primary) hypertension: Secondary | ICD-10-CM | POA: Diagnosis not present

## 2018-12-22 DIAGNOSIS — I130 Hypertensive heart and chronic kidney disease with heart failure and stage 1 through stage 4 chronic kidney disease, or unspecified chronic kidney disease: Secondary | ICD-10-CM | POA: Diagnosis not present

## 2018-12-22 DIAGNOSIS — Z825 Family history of asthma and other chronic lower respiratory diseases: Secondary | ICD-10-CM

## 2018-12-22 DIAGNOSIS — J45909 Unspecified asthma, uncomplicated: Secondary | ICD-10-CM | POA: Diagnosis not present

## 2018-12-22 DIAGNOSIS — Z96651 Presence of right artificial knee joint: Secondary | ICD-10-CM | POA: Diagnosis present

## 2018-12-22 DIAGNOSIS — K219 Gastro-esophageal reflux disease without esophagitis: Secondary | ICD-10-CM | POA: Diagnosis present

## 2018-12-22 DIAGNOSIS — Z8249 Family history of ischemic heart disease and other diseases of the circulatory system: Secondary | ICD-10-CM

## 2018-12-22 DIAGNOSIS — M810 Age-related osteoporosis without current pathological fracture: Secondary | ICD-10-CM | POA: Diagnosis present

## 2018-12-22 DIAGNOSIS — M069 Rheumatoid arthritis, unspecified: Secondary | ICD-10-CM | POA: Diagnosis present

## 2018-12-22 DIAGNOSIS — I5032 Chronic diastolic (congestive) heart failure: Secondary | ICD-10-CM | POA: Diagnosis present

## 2018-12-22 DIAGNOSIS — Z887 Allergy status to serum and vaccine status: Secondary | ICD-10-CM

## 2018-12-22 DIAGNOSIS — Z86718 Personal history of other venous thrombosis and embolism: Secondary | ICD-10-CM | POA: Diagnosis not present

## 2018-12-22 DIAGNOSIS — Z7951 Long term (current) use of inhaled steroids: Secondary | ICD-10-CM

## 2018-12-22 DIAGNOSIS — I739 Peripheral vascular disease, unspecified: Secondary | ICD-10-CM | POA: Diagnosis present

## 2018-12-22 DIAGNOSIS — N182 Chronic kidney disease, stage 2 (mild): Secondary | ICD-10-CM | POA: Diagnosis not present

## 2018-12-22 DIAGNOSIS — J4550 Severe persistent asthma, uncomplicated: Secondary | ICD-10-CM | POA: Diagnosis present

## 2018-12-22 DIAGNOSIS — Z9049 Acquired absence of other specified parts of digestive tract: Secondary | ICD-10-CM

## 2018-12-22 DIAGNOSIS — G2581 Restless legs syndrome: Secondary | ICD-10-CM | POA: Diagnosis present

## 2018-12-22 DIAGNOSIS — Z885 Allergy status to narcotic agent status: Secondary | ICD-10-CM

## 2018-12-22 DIAGNOSIS — Z888 Allergy status to other drugs, medicaments and biological substances status: Secondary | ICD-10-CM

## 2018-12-22 DIAGNOSIS — Z8349 Family history of other endocrine, nutritional and metabolic diseases: Secondary | ICD-10-CM

## 2018-12-22 DIAGNOSIS — I447 Left bundle-branch block, unspecified: Secondary | ICD-10-CM | POA: Diagnosis present

## 2018-12-22 DIAGNOSIS — Z7983 Long term (current) use of bisphosphonates: Secondary | ICD-10-CM

## 2018-12-22 DIAGNOSIS — Z90722 Acquired absence of ovaries, bilateral: Secondary | ICD-10-CM

## 2018-12-22 DIAGNOSIS — E785 Hyperlipidemia, unspecified: Secondary | ICD-10-CM | POA: Diagnosis present

## 2018-12-22 LAB — BASIC METABOLIC PANEL
Anion gap: 11 (ref 5–15)
BUN: 14 mg/dL (ref 8–23)
CO2: 27 mmol/L (ref 22–32)
Calcium: 8.9 mg/dL (ref 8.9–10.3)
Chloride: 99 mmol/L (ref 98–111)
Creatinine, Ser: 1.05 mg/dL — ABNORMAL HIGH (ref 0.44–1.00)
GFR calc Af Amer: 57 mL/min — ABNORMAL LOW (ref >60)
GFR calc non Af Amer: 49 mL/min — ABNORMAL LOW (ref >60)
Glucose, Bld: 122 mg/dL — ABNORMAL HIGH (ref 70–99)
Potassium: 3.9 mmol/L (ref 3.5–5.1)
Sodium: 137 mmol/L (ref 135–145)

## 2018-12-22 LAB — CBC
HCT: 44.3 % (ref 36.0–46.0)
Hemoglobin: 13.9 g/dL (ref 12.0–15.0)
MCH: 28.8 pg (ref 26.0–34.0)
MCHC: 31.4 g/dL (ref 30.0–36.0)
MCV: 91.9 fL (ref 80.0–100.0)
Platelets: 271 10*3/uL (ref 150–400)
RBC: 4.82 MIL/uL (ref 3.87–5.11)
RDW: 14.1 % (ref 11.5–15.5)
WBC: 6 10*3/uL (ref 4.0–10.5)
nRBC: 0 % (ref 0.0–0.2)

## 2018-12-22 LAB — TROPONIN I (HIGH SENSITIVITY)
Troponin I (High Sensitivity): 15 ng/L (ref <18)
Troponin I (High Sensitivity): 15 ng/L (ref <18)

## 2018-12-22 MED ORDER — ACETAMINOPHEN 650 MG RE SUPP: 650.0000 mg | Freq: Four times a day (QID) | RECTAL | Status: DC | PRN

## 2018-12-22 MED ORDER — METOPROLOL TARTRATE 5 MG/5ML IV SOLN
2.5000 mg | Freq: Once | INTRAVENOUS | Status: AC
  Administered 2018-12-22: 2.5 mg via INTRAVENOUS
  Filled 2018-12-22: qty 5

## 2018-12-22 MED ORDER — ACETAMINOPHEN 325 MG PO TABS: 650.0000 mg | ORAL_TABLET | Freq: Four times a day (QID) | ORAL | Status: DC | PRN

## 2018-12-22 MED ORDER — METOPROLOL TARTRATE 5 MG/5ML IV SOLN: 10.0000 mg | INTRAVENOUS | Status: DC | PRN

## 2018-12-22 MED ORDER — SODIUM CHLORIDE 0.9% FLUSH: 3.0000 mL | Freq: Once | INTRAVENOUS | Status: DC

## 2018-12-22 NOTE — H&P (Signed)
History and Physical    [name] VOJ:[id number] DOB: 1932/11/22 DOA: 12/22/2018  PCP: Crist Infante, MD Patient coming from: Home  Chief Complaint: Chest pain  HPI: [name] is a 85 y.o. female with medical history significant of severe persistent asthma followed by pulmonology, chronic diastolic congestive heart failure, CKD stage II, GERD, DVT, hyperlipidemia, PVD, rheumatoid arthritis presenting to the hospital for evaluation of chest pain.  Patient reports 2-day history of nonexertional, left-sided, nonradiating, pressure-like chest pain.  States chest pain can occur anytime and sometimes wakes her up from her sleep.  Each episode lasts 2 to 3 minutes.  No associated dyspnea or diaphoresis.  Denies history of coronary artery disease or prior MI.  States her blood pressure has been high for the past 2 days.  Reports compliance with her home blood pressure medications.  She has also been feeling lightheaded for the past 2 days.  No room spinning sensation.  No other complaints.  ED Course: Systolic as high as 993Z.  CBC unremarkable.  BMP without significant electrolyte abnormality.  Creatinine 1.0, baseline.  High-sensitivity troponin checked twice negative.  EKG without acute ischemic changes.  COVID-19 rapid test pending.  Chest x-ray personally reviewed showing no active cardiopulmonary disease.  ED provider discussed the case with cardiology who recommended treatment for elevated blood pressure and echocardiogram.  Received IV metoprolol 2.5 mg.   Review of Systems:  All systems reviewed and apart from history of presenting illness, are negative.  Past Medical History:  Diagnosis Date  . Anemia    after hysterectomy  . Asthma, severe persistent    pulmologist-- dr Lynford Citizen  . Cancer [facility])    Breast cancer on right  . Chronic kidney disease, stage II (mild)   . DDD (degenerative disc disease), cervical   . Diverticulitis   . Diverticulosis yrs ago   hx of   . Edema,  lower extremity    occ both legs swell  . GERD (gastroesophageal reflux disease)   . Headache    sinus headaches  . Heart murmur    no problems - per pt  . History of breast cancer 1990 left mastectomy also   1989  S/P   RIGHT MASTECTOMY ;  NO CHEMORADIATION //   NO RECURRENCE  . History of DVT of lower extremity 5 yrs ago   right leg  . History of shingles   . Hyperlipidemia   . Internal hemorrhoid   . Leg ulcer, left ([place])   . Osteoporosis, unspecified    Knee and hip osteoarthritis bilaterally  . Perennial allergic rhinitis   . Peripheral vascular disease ([place])   . Pneumonia   . Restless legs syndrome (RLS)   . Rheumatoid arthritis ([place])    hands  . Thyroid nodule    followed by dr perrini yearly, no current problam  . Unspecified essential hypertension     Past Surgical History:  Procedure Laterality Date  . CARDIAC CATHETERIZATION  08/ 01/ 2008   dr Cathie Olden   normal -- EF of 65%  . CARDIOVASCULAR STRESS TEST  05-22-2011   dr Cathie Olden   normal lexiscan perfusion study/  no ischemia/  lvsf  86%  . CATARACT EXTRACTION W/ INTRAOCULAR LENS  IMPLANT, BILATERAL    . CHOLECYSTECTOMY  1993   laparoscopic  . CONVERSION TO TOTAL HIP Left 12/03/2017   Procedure: CONVERSION TO LEFT TOTAL HIP;  Surgeon: Paralee Cancel, MD;  Location: [facility];  Service: Orthopedics;  Laterality: Left;  90 mins  .  HIP ARTHROPLASTY Left 12/22/2016   Procedure: HEMIARTHROPLASTY, LEFT;  Surgeon: Paralee Cancel, MD;  Location: [facility];  Service: Orthopedics;  Laterality: Left;  . INCISION AND DRAINAGE OF WOUND Left 10/24/2013   Procedure: IRRIGATION AND DEBRIDEMENT OF LEFT LEG WITH PLACEMENT OF ACELL AND WOUND VAC;  Surgeon: Theodoro Kos, DO;  Location: [place];  Service: Plastics;  Laterality: Left;  . [place]  . MASTECTOMY Bilateral rigth 1989///   left  1990   breast cancer 1989//   fibrocytic disease 1990  . ORIF HUMERUS FRACTURE Right 09/21/2018   Procedure:  OPEN REDUCTION INTERNAL FIXATION (ORIF) PROXIMAL HUMERUS FRACTURE;  Surgeon: Nicholes Stairs, MD;  Location: [place];  Service: Orthopedics;  Laterality: Right;  90 mins  . TOTAL ABDOMINAL HYSTERECTOMY W/ BILATERAL SALPINGOOPHORECTOMY  1989   done at same time as right mastectomy  . TOTAL KNEE ARTHROPLASTY Right 10/05/2012   Procedure: RIGHT TOTAL KNEE ARTHROPLASTY;  Surgeon: Mauri Pole, MD;  Location: [facility];  Service: Orthopedics;  Laterality: Right;     reports that she has never smoked. She has never used smokeless tobacco. She reports that she does not drink alcohol or use drugs.  Allergies  Allergen Reactions  . Eggs Or Egg-Derived Products     UNSPECIFIED REACTION  RAW EGGS.. Can eat cooked eggs  . Influenza Vaccines     UNSPECIFIED REACTION  Egg allergy   . Nucala [Mepolizumab]     UNSPECIFIED REACTION   . Cephalosporins Rash  . Codeine Rash  . Gabapentin Nausea Only  . Levofloxacin Rash  . Pneumococcal Vaccines Rash    Family History  Problem Relation Age of Onset  . Heart attack Mother   . Asthma Mother   . Heart disease Mother   . Hyperlipidemia Mother   . Heart attack Father   . Heart failure Father   . Deep vein thrombosis Father   . Heart disease Father   . Peripheral vascular disease Father        amputation  . Cancer Brother   . Asthma Brother   . Coronary artery disease Sister   . Heart disease Sister     Prior to Admission medications   Medication Sig Start Date End Date Taking? Authorizing Provider  acetaminophen (TYLENOL) 500 MG tablet Take 2 tablets (1,000 mg total) by mouth every 8 (eight) hours. Patient taking differently: Take 1,000 mg by mouth every 6 (six) hours as needed (pain).  12/03/17   Danae Orleans, PA-C  albuterol (PROAIR HFA) 108 (90 Base) MCG/ACT inhaler Inhale 2 puffs into the lungs every 6 (six) hours as needed for wheezing or shortness of breath. 11/02/18   Brand Males, MD  albuterol (PROVENTIL) (2.5 MG/3ML) 0.083%  nebulizer solution Take 3 mLs (2.5 mg total) by nebulization every 6 (six) hours as needed for wheezing or shortness of breath. Dx: J45.41 09/03/17   Brand Males, MD  benzonatate (TESSALON) 100 MG capsule Take 1 capsule (100 mg total) by mouth 3 (three) times daily as needed for cough. 12/02/17   Brand Males, MD  Calcium Carbonate-Vitamin D (CALTRATE 600+D) 600-400 MG-UNIT per tablet Take 1 tablet by mouth daily.     [provider]  chlorpheniramine (CHLOR-TRIMETON) 4 MG tablet Take 4-8 mg by mouth See admin instructions. 4 mg in the morning and 8 mg at bedtime  01/12/13   Elsie Stain, MD  Cholecalciferol (VITAMIN D3) 1000 UNITS tablet Take 1,000 Units by  mouth every evening.     [provider]  dextromethorphan (DELSYM) 30 MG/5ML liquid Take 60 mg by mouth 2 (two) times daily as needed for cough.    [provider]  dextromethorphan-guaiFENesin (MUCINEX DM) 30-600 MG 12hr tablet Take 1 tablet by mouth daily as needed for cough.    [provider]  doxycycline (VIBRA-TABS) 100 MG tablet Take 1 tablet (100 mg total) by mouth 2 (two) times daily. Patient not taking: Reported on 09/16/2018 06/08/18   Brand Males, MD  DUPIXENT 300 MG/2ML prefilled syringe INJECT 300 MG SUBCUTANEOUSLY EVERY OTHER WEEK 11/05/18   Brand Males, MD  fexofenadine (ALLEGRA) 180 MG tablet Take 180 mg by mouth daily.   12/02/10   Elsie Stain, MD  furosemide (LASIX) 20 MG tablet Take 20 mg by mouth daily.    [provider]  HYDROcodone-acetaminophen (NORCO/VICODIN) 5-325 MG tablet Take 1-2 tablets by mouth every 4 (four) hours as needed for moderate pain (pain score 4-6). 09/22/18   Nicholes Stairs, MD  ibandronate (BONIVA) 150 MG tablet Take 1 tablet by mouth every 30 (thirty) days.  03/31/18   [provider]  mometasone (NASONEX) 50 MCG/ACT nasal spray Place 2 sprays into the nose daily. Patient taking differently: Place 2 sprays into the  nose 2 (two) times a day.  12/02/17   Brand Males, MD  montelukast (SINGULAIR) 10 MG tablet TAKE 1 TABLET BY MOUTH EVERY DAY AT BEDTIME Patient taking differently: Take 10 mg by mouth at bedtime.  08/12/18   Brand Males, MD  morphine (MSIR) 15 MG tablet Take 1 tablet (15 mg total) by mouth every 4 (four) hours as needed for severe pain. Patient not taking: Reported on 09/16/2018 09/07/18   Deno Etienne, DO  Multiple Vitamin (MULTIVITAMIN WITH MINERALS) TABS tablet Take 1 tablet by mouth daily. Centrum Silver Multivitamin    [provider]  NEXIUM 40 MG capsule TAKE 1 CAPSULE (40 MG TOTAL) BY MOUTH 2 (TWO) TIMES DAILY. NAME BRAND ONLY 03/30/18   Brand Males, MD  potassium chloride (K-DUR) 10 MEQ tablet TAKE 1 TABLET BY MOUTH EVERY DAY Patient taking differently: Take 10 mEq by mouth daily.  04/26/18   Nahser, Wonda Cheng, MD  pramipexole (MIRAPEX) 0.125 MG tablet Take 0.375 mg by mouth at bedtime.     [provider]  predniSONE (DELTASONE) 10 MG tablet Take 4tabsx2days,2tabsx2days,1tabx2days,0.5tabx2days,then stop Patient not taking: Reported on 07/27/2018 06/08/18   Brand Males, MD  rosuvastatin (CRESTOR) 5 MG tablet TAKE 1 TABLET BY MOUTH EVERYDAY AT BEDTIME Patient taking differently: Take 5 mg by mouth at bedtime.  05/03/18   Nahser, Wonda Cheng, MD  sennosides-docusate sodium (SENOKOT-S) 8.6-50 MG tablet Take 1 tablet by mouth daily as needed for constipation.    [provider]  Spacer/Aero-Holding Chambers (AEROCHAMBER MV) inhaler by Other route. Use as instructed     [provider]  SYMBICORT 160-4.5 MCG/ACT inhaler TAKE 2 PUFFS BY MOUTH TWICE A DAY Patient taking differently: Inhale 2 puffs into the lungs 2 (two) times a day.  05/28/18   Brand Males, MD  telmisartan (MICARDIS) 20 MG tablet TAKE 1 TABLET BY MOUTH EVERYDAY AT BEDTIME Patient taking differently: Take 20 mg by mouth every evening.  04/30/18   Nahser, Wonda Cheng, MD  Tiotropium  Bromide Monohydrate (SPIRIVA RESPIMAT) 2.5 MCG/ACT AERS Inhale 2 puffs into the lungs daily. 12/02/17   Brand Males, MD  vitamin B-12 (CYANOCOBALAMIN) 500 MCG tablet Take 500 mcg by mouth daily.  [provider]  XARELTO 20 MG TABS tablet Take 20 mg by mouth daily with supper.     [provider]    Physical Exam: Vitals:   12/22/18 2300 12/22/18 2306 12/22/18 2330 12/23/18 0000  BP: (!) 174/88 (!) 175/80 (!) 159/62 139/60  Pulse: (!) 104 99 80 77  Resp: 18 15 16 15   Temp:      TempSrc:      SpO2: 97% 96% 95% 92%    Physical Exam  Constitutional: She is oriented to person, place, and time. She appears well-developed and well-nourished. No distress.  HENT:  Head: Normocephalic.  Mouth/Throat: Oropharynx is clear and moist.  Eyes: EOM are normal. Right eye exhibits no discharge. Left eye exhibits no discharge.  Neck: Neck supple.  Cardiovascular: Normal rate, regular rhythm and intact distal pulses.  Pulmonary/Chest: Effort normal and breath sounds normal. No respiratory distress. She has no wheezes. She has no rales.  Abdominal: Soft. Bowel sounds are normal. She exhibits no distension. There is no abdominal tenderness. There is no guarding.  Musculoskeletal:        General: No edema.  Neurological: She is alert and oriented to person, place, and time. No cranial nerve deficit.  Strength 5 out of 5 in bilateral upper and lower extremities.   Sensation to light touch intact throughout.  Skin: Skin is warm and dry. She is not diaphoretic.     Labs on Admission: I have personally reviewed following labs and imaging studies  CBC: Recent Labs  Lab 12/22/18 1623  WBC 6.0  HGB 13.9  HCT 44.3  MCV 91.9  PLT 756   Basic Metabolic Panel: Recent Labs  Lab 12/22/18 1623  NA 137  K 3.9  CL 99  CO2 27  GLUCOSE 122*  BUN 14  CREATININE 1.05*  CALCIUM 8.9   GFR: CrCl cannot be calculated (Unknown ideal weight.). Liver Function Tests: No results  for input(s): AST, ALT, ALKPHOS, BILITOT, PROT, ALBUMIN in the last 168 hours. No results for input(s): LIPASE, AMYLASE in the last 168 hours. No results for input(s): AMMONIA in the last 168 hours. Coagulation Profile: No results for input(s): INR, PROTIME in the last 168 hours. Cardiac Enzymes: No results for input(s): CKTOTAL, CKMB, CKMBINDEX, TROPONINI in the last 168 hours. BNP (last 3 results) No results for input(s): PROBNP in the last 8760 hours. HbA1C: No results for input(s): HGBA1C in the last 72 hours. CBG: No results for input(s): GLUCAP in the last 168 hours. Lipid Profile: No results for input(s): CHOL, HDL, LDLCALC, TRIG, CHOLHDL, LDLDIRECT in the last 72 hours. Thyroid Function Tests: No results for input(s): TSH, T4TOTAL, FREET4, T3FREE, THYROIDAB in the last 72 hours. Anemia Panel: No results for input(s): VITAMINB12, FOLATE, FERRITIN, TIBC, IRON, RETICCTPCT in the last 72 hours. Urine analysis:    Component Value Date/Time   COLORURINE YELLOW 12/19/2016 0522   APPEARANCEUR CLEAR 12/19/2016 0522   LABSPEC 1.009 12/19/2016 0522   PHURINE 6.0 12/19/2016 0522   GLUCOSEU NEGATIVE 12/19/2016 0522   HGBUR NEGATIVE 12/19/2016 0522   BILIRUBINUR NEGATIVE 12/19/2016 0522   KETONESUR 5 (A) 12/19/2016 0522   PROTEINUR NEGATIVE 12/19/2016 0522   UROBILINOGEN 0.2 09/27/2012 1349   NITRITE NEGATIVE 12/19/2016 0522   LEUKOCYTESUR TRACE (A) 12/19/2016 0522    Radiological Exams on Admission: Dg Chest 2 View  Result Date: 12/22/2018 CLINICAL DATA:  Chest pain, short of breath EXAM: CHEST - 2 VIEW COMPARISON:  05/17/2018 FINDINGS: No focal opacity or pleural effusion.  Stable cardiomediastinal silhouette. No pneumothorax. Bilateral breast prostheses. IMPRESSION: No active cardiopulmonary disease. Electronically Signed   By: Donavan Foil M.D.   On: 12/22/2018 18:14    EKG: Independently reviewed.  Sinus rhythm, wide QRS complexes (130 MS), LBBB.  No significant change since  prior tracing.  Assessment/Plan Principal Problem:   Chest pain Active Problems:   Severe persistent asthma   GERD   Hyperlipidemia   Essential hypertension   Chest pain Appears atypical based on history.  Possibly related to elevated blood pressure.  High-sensitivity troponin checked twice negative.  EKG without acute ischemic changes.  Stress test done in November 2016 was low risk.  However, does have risk factors for coronary artery disease and heart score 5.  Currently chest pain-free and appears comfortable on exam. -Cardiac monitoring -Aspirin 324 mg -Echocardiogram -EKG PRN recurrence of chest pain  Hypertension Blood pressure elevated with systolic in the 161W to 960A.  Received IV Lopressor 2.5 mg in the ED and systolic now in 540J. -IV Lopressor PRN SBP >150  Lightheadedness Patient denies any room spinning sensation.  No focal neuro deficit. -Check orthostatics  Severe persistent asthma -Stable.  No bronchospasm.  Continue home Singulair and inhalers.  History of DVT -Continue home Xarelto  Chronic diastolic congestive heart failure -Stable.  No signs of volume overload at this time.  Hold home Lasix.  Orthostatics pending.  Chronic pain -Continue home gabapentin PRN, Norco PRN  GERD -Continue PPI  Hyperlipidemia -Continue Crestor  DVT prophylaxis: Xarelto Code Status: Patient wishes to be full code. Family Communication: No family available at this time. Disposition Plan: Anticipate discharge after clinical improvement. Admission status: It is my clinical opinion that referral for OBSERVATION is reasonable and necessary in this patient based on the above information provided. The aforementioned taken together are felt to place the patient at high risk for further clinical deterioration. However it is anticipated that the patient may be medically stable for discharge from the hospital within 24 to 48 hours.  The medical decision making on this patient was  of high complexity and the patient is at high risk for clinical deterioration, therefore this is a level 3 visit.  Shela Leff MD [facility] Pager [phone]  If 7PM-7AM, please contact night-coverage [email or web address] Password [facility]  12/23/2018, 12:37 AM

## 2018-12-22 NOTE — ED Provider Notes (Addendum)
I saw and evaluated the patient, reviewed the resident's note and I agree with the findings and plan.  EKG: EKG Interpretation  Date/Time:  Wednesday December 22 2018 16:25:00 EDT Ventricular Rate:  92 PR Interval:  168 QRS Duration: 130 QT Interval:  404 QTC Calculation: 499 R Axis:   36 Text Interpretation:  Normal sinus rhythm Possible Left atrial enlargement Left bundle branch block Abnormal ECG old LBBB.  no acute change Confirmed by Charlesetta Shanks [phone]) on 12/22/2018 10:57:52 PM Patient has had intermittent chest pain for the past 3 days.  It is been a tight quality in the center of her chest.  She reports that she felt dizzy as well.  Initially she thought maybe she had some of her vertigo but then it did not really feel like vertigo and it did not get any better with meclizine.  Patient denies any lower extremity swelling or calf pain.  She is chronically on Xarelto for history of DVT.  Patient is alert and appropriate.  No respiratory distress at rest.  Patient has tachycardia at 100 with S3 gallop.  Patient has expiratory wheeze at the right lung base.  Left grossly clear.  No crackle.  Abdomen soft nontender.  No significant peripheral edema.   Patient is hypertensive in the 170s.  She reports that her blood pressure has been elevated since Monday when her chest discomfort started.  She reports typically her blood pressure runs in the 120s.  We will add Lopressor in  2.5 mg increments.  Plan for admission.     Charlesetta Shanks, MD 12/22/18 2316    Charlesetta Shanks, MD 12/22/18 2320

## 2018-12-22 NOTE — ED Provider Notes (Signed)
[facility] Provider Note   CSN: [id number] Arrival date & time: 12/22/18  1619    History   Chief Complaint Chief Complaint  Patient presents with  . Chest Pain  . Shortness of Breath    HPI [name] is a 85 y.o. female with history of asthma, DVT on Xarelto, diastolic heart failure, and hypertension presents to the ED complaining of left-sided chest pain for the past 3 days.  Patient describes the sensation as tightness located on the left side and center of her chest.  Patient reports associated dizziness which she describes as lightheadedness that did not improve with meclizine.  Patient also reports mild nausea without vomiting.  She denies any significant shortness of breath or lower extremity swelling.  Patient reports that her blood pressure has been higher than normal since her pain began on Monday.  She denies any recent fever, chills, cough, abdominal pain, diarrhea, or any other complaints.     The history is provided by the patient.    Past Medical History:  Diagnosis Date  . Anemia    after hysterectomy  . Asthma, severe persistent    pulmologist-- dr Lynford Citizen  . Cancer [facility])    Breast cancer on right  . Chronic kidney disease, stage II (mild)   . DDD (degenerative disc disease), cervical   . Diverticulitis   . Diverticulosis yrs ago   hx of   . Edema, lower extremity    occ both legs swell  . GERD (gastroesophageal reflux disease)   . Headache    sinus headaches  . Heart murmur    no problems - per pt  . History of breast cancer 1990 left mastectomy also   1989  S/P   RIGHT MASTECTOMY ;  NO CHEMORADIATION //   NO RECURRENCE  . History of DVT of lower extremity 5 yrs ago   right leg  . History of shingles   . Hyperlipidemia   . Internal hemorrhoid   . Leg ulcer, left ([place])   . Osteoporosis, unspecified    Knee and hip osteoarthritis bilaterally  . Perennial allergic rhinitis   . Peripheral vascular  disease ([place])   . Pneumonia   . Restless legs syndrome (RLS)   . Rheumatoid arthritis ([place])    hands  . Thyroid nodule    followed by dr perrini yearly, no current problam  . Unspecified essential hypertension     Patient Active Problem List   Diagnosis Date Noted  . Chest pain 12/22/2018  . Displaced fracture of greater tuberosity of right humerus, initial encounter for closed fracture 09/21/2018  . Closed fracture of right proximal humerus 09/21/2018  . S/P left TH revision 12/03/2017  . Essential hypertension 11/17/2017  . Chronic diastolic CHF (congestive heart failure) ([place]) 03/19/2017  . CAD (coronary artery disease), native coronary artery 12/20/2016  . Closed left hip fracture ([place]) 12/19/2016  . CKD (chronic kidney disease) stage 3, GFR 30-59 ml/min (H[facility]) 12/19/2016  . History of DVT of lower extremity   . Lung nodule 08/03/2014  . Peripheral vascular disease, unspecified ([place]) 09/12/2013  . Hyperlipidemia 03/03/2013  . Expected blood loss anemia 10/06/2012  . Overweight (BMI 25.0-29.9) 10/06/2012  . DJD (degenerative joint disease) 05/26/2012  . Severe persistent asthma 08/05/2007  . Hypertensive heart disease without CHF 04/30/2007  . GERD 01/13/2007    Past Surgical History:  Procedure Laterality Date  . CARDIAC CATHETERIZATION  08/ 01/ 2008   dr Cathie Olden  normal -- EF of 65%  . CARDIOVASCULAR STRESS TEST  05-22-2011   dr Cathie Olden   normal lexiscan perfusion study/  no ischemia/  lvsf  86%  . CATARACT EXTRACTION W/ INTRAOCULAR LENS  IMPLANT, BILATERAL    . CHOLECYSTECTOMY  1993   laparoscopic  . CONVERSION TO TOTAL HIP Left 12/03/2017   Procedure: CONVERSION TO LEFT TOTAL HIP;  Surgeon: Paralee Cancel, MD;  Location: [facility];  Service: Orthopedics;  Laterality: Left;  90 mins  . HIP ARTHROPLASTY Left 12/22/2016   Procedure: HEMIARTHROPLASTY, LEFT;  Surgeon: Paralee Cancel, MD;  Location: [facility];  Service: Orthopedics;  Laterality: Left;  . INCISION AND DRAINAGE OF  WOUND Left 10/24/2013   Procedure: IRRIGATION AND DEBRIDEMENT OF LEFT LEG WITH PLACEMENT OF ACELL AND WOUND VAC;  Surgeon: Theodoro Kos, DO;  Location: [facility];  Service: Plastics;  Laterality: Left;  . [place]  . MASTECTOMY Bilateral rigth 1989///   left  1990   breast cancer 1989//   fibrocytic disease 1990  . ORIF HUMERUS FRACTURE Right 09/21/2018   Procedure: OPEN REDUCTION INTERNAL FIXATION (ORIF) PROXIMAL HUMERUS FRACTURE;  Surgeon: Nicholes Stairs, MD;  Location: [facility];  Service: Orthopedics;  Laterality: Right;  90 mins  . TOTAL ABDOMINAL HYSTERECTOMY W/ BILATERAL SALPINGOOPHORECTOMY  1989   done at same time as right mastectomy  . TOTAL KNEE ARTHROPLASTY Right 10/05/2012   Procedure: RIGHT TOTAL KNEE ARTHROPLASTY;  Surgeon: Mauri Pole, MD;  Location: [facility];  Service: Orthopedics;  Laterality: Right;     OB History   No obstetric history on file.      Home Medications    Prior to Admission medications   Medication Sig Start Date End Date Taking? Authorizing Provider  acetaminophen (TYLENOL) 500 MG tablet Take 2 tablets (1,000 mg total) by mouth every 8 (eight) hours. Patient taking differently: Take 1,000 mg by mouth every 6 (six) hours as needed (pain).  12/03/17   Danae Orleans, PA-C  albuterol (PROAIR HFA) 108 (90 Base) MCG/ACT inhaler Inhale 2 puffs into the lungs every 6 (six) hours as needed for wheezing or shortness of breath. 11/02/18   Brand Males, MD  albuterol (PROVENTIL) (2.5 MG/3ML) 0.083% nebulizer solution Take 3 mLs (2.5 mg total) by nebulization every 6 (six) hours as needed for wheezing or shortness of breath. Dx: J45.41 09/03/17   Brand Males, MD  benzonatate (TESSALON) 100 MG capsule Take 1 capsule (100 mg total) by mouth 3 (three) times daily as needed for cough. 12/02/17   Brand Males, MD  Calcium Carbonate-Vitamin D (CALTRATE 600+D) 600-400 MG-UNIT per tablet Take 1 tablet by mouth daily.      [provider]  chlorpheniramine (CHLOR-TRIMETON) 4 MG tablet Take 4-8 mg by mouth See admin instructions. 4 mg in the morning and 8 mg at bedtime  01/12/13   Elsie Stain, MD  Cholecalciferol (VITAMIN D3) 1000 UNITS tablet Take 1,000 Units by mouth every evening.     [provider]  dextromethorphan (DELSYM) 30 MG/5ML liquid Take 60 mg by mouth 2 (two) times daily as needed for cough.    [provider]  dextromethorphan-guaiFENesin (MUCINEX DM) 30-600 MG 12hr tablet Take 1 tablet by mouth daily as needed for cough.    [provider]  doxycycline (VIBRA-TABS) 100 MG tablet Take 1 tablet (100 mg total) by mouth 2 (two) times daily. Patient not taking: Reported on 09/16/2018 06/08/18   Brand Males,  MD  DUPIXENT 300 MG/2ML prefilled syringe INJECT 300 MG SUBCUTANEOUSLY EVERY OTHER WEEK 11/05/18   Brand Males, MD  fexofenadine (ALLEGRA) 180 MG tablet Take 180 mg by mouth daily.   12/02/10   Elsie Stain, MD  furosemide (LASIX) 20 MG tablet Take 20 mg by mouth daily.    [provider]  HYDROcodone-acetaminophen (NORCO/VICODIN) 5-325 MG tablet Take 1-2 tablets by mouth every 4 (four) hours as needed for moderate pain (pain score 4-6). 09/22/18   Nicholes Stairs, MD  ibandronate (BONIVA) 150 MG tablet Take 1 tablet by mouth every 30 (thirty) days.  03/31/18   [provider]  mometasone (NASONEX) 50 MCG/ACT nasal spray Place 2 sprays into the nose daily. Patient taking differently: Place 2 sprays into the nose 2 (two) times a day.  12/02/17   Brand Males, MD  montelukast (SINGULAIR) 10 MG tablet TAKE 1 TABLET BY MOUTH EVERY DAY AT BEDTIME Patient taking differently: Take 10 mg by mouth at bedtime.  08/12/18   Brand Males, MD  morphine (MSIR) 15 MG tablet Take 1 tablet (15 mg total) by mouth every 4 (four) hours as needed for severe pain. Patient not taking: Reported on 09/16/2018 09/07/18   Deno Etienne, DO  Multiple  Vitamin (MULTIVITAMIN WITH MINERALS) TABS tablet Take 1 tablet by mouth daily. Centrum Silver Multivitamin    [provider]  NEXIUM 40 MG capsule TAKE 1 CAPSULE (40 MG TOTAL) BY MOUTH 2 (TWO) TIMES DAILY. NAME BRAND ONLY 03/30/18   Brand Males, MD  potassium chloride (K-DUR) 10 MEQ tablet TAKE 1 TABLET BY MOUTH EVERY DAY Patient taking differently: Take 10 mEq by mouth daily.  04/26/18   Nahser, Wonda Cheng, MD  pramipexole (MIRAPEX) 0.125 MG tablet Take 0.375 mg by mouth at bedtime.     [provider]  predniSONE (DELTASONE) 10 MG tablet Take 4tabsx2days,2tabsx2days,1tabx2days,0.5tabx2days,then stop Patient not taking: Reported on 07/27/2018 06/08/18   Brand Males, MD  rosuvastatin (CRESTOR) 5 MG tablet TAKE 1 TABLET BY MOUTH EVERYDAY AT BEDTIME Patient taking differently: Take 5 mg by mouth at bedtime.  05/03/18   Nahser, Wonda Cheng, MD  sennosides-docusate sodium (SENOKOT-S) 8.6-50 MG tablet Take 1 tablet by mouth daily as needed for constipation.    [provider]  Spacer/Aero-Holding Chambers (AEROCHAMBER MV) inhaler by Other route. Use as instructed     [provider]  SYMBICORT 160-4.5 MCG/ACT inhaler TAKE 2 PUFFS BY MOUTH TWICE A DAY Patient taking differently: Inhale 2 puffs into the lungs 2 (two) times a day.  05/28/18   Brand Males, MD  telmisartan (MICARDIS) 20 MG tablet TAKE 1 TABLET BY MOUTH EVERYDAY AT BEDTIME Patient taking differently: Take 20 mg by mouth every evening.  04/30/18   Nahser, Wonda Cheng, MD  Tiotropium Bromide Monohydrate (SPIRIVA RESPIMAT) 2.5 MCG/ACT AERS Inhale 2 puffs into the lungs daily. 12/02/17   Brand Males, MD  vitamin B-12 (CYANOCOBALAMIN) 500 MCG tablet Take 500 mcg by mouth daily.      [provider]  XARELTO 20 MG TABS tablet Take 20 mg by mouth daily with supper.     [provider]    Family History Family History  Problem Relation Age of Onset  . Heart attack Mother   .  Asthma Mother   . Heart disease Mother   . Hyperlipidemia Mother   . Heart attack Father   . Heart failure Father   . Deep vein thrombosis Father   . Heart disease Father   .  Peripheral vascular disease Father        amputation  . Cancer Brother   . Asthma Brother   . Coronary artery disease Sister   . Heart disease Sister     Social History Social History   Tobacco Use  . Smoking status: Never Smoker  . Smokeless tobacco: Never Used  Substance Use Topics  . Alcohol use: No  . Drug use: No     Allergies   Eggs or egg-derived products, Influenza vaccines, Nucala [mepolizumab], Cephalosporins, Codeine, Gabapentin, Levofloxacin, and Pneumococcal vaccines   Review of Systems Review of Systems  Constitutional: Negative for chills and fever.  HENT: Negative for ear pain and sore throat.   Eyes: Negative for pain and visual disturbance.  Respiratory: Negative for cough and shortness of breath.   Cardiovascular: Positive for chest pain. Negative for palpitations.  Gastrointestinal: Positive for nausea. Negative for abdominal pain, diarrhea and vomiting.  Genitourinary: Negative for dysuria and hematuria.  Musculoskeletal: Negative for arthralgias and back pain.  Skin: Negative for color change and rash.  Neurological: Positive for dizziness and light-headedness. Negative for seizures and syncope.  All other systems reviewed and are negative.    Physical Exam Updated Vital Signs BP (!) 159/62   Pulse 80   Temp 97.9 F (36.6 C) (Oral)   Resp 16   SpO2 95%   Physical Exam Vitals signs and nursing note reviewed.  Constitutional:      General: She is not in acute distress.    Appearance: Normal appearance. She is normal weight. She is not ill-appearing, toxic-appearing or diaphoretic.  HENT:     Head: Normocephalic and atraumatic.     Nose: Nose normal. No congestion or rhinorrhea.     Mouth/Throat:     Mouth: Mucous membranes are moist.     Pharynx: Oropharynx is  clear. No oropharyngeal exudate or posterior oropharyngeal erythema.  Eyes:     Extraocular Movements: Extraocular movements intact.     Pupils: Pupils are equal, round, and reactive to light.  Neck:     Musculoskeletal: Normal range of motion and neck supple. No neck rigidity or muscular tenderness.  Cardiovascular:     Rate and Rhythm: Normal rate and regular rhythm.     Pulses: Normal pulses.     Heart sounds: No murmur. No friction rub. Gallop (S3) present.   Pulmonary:     Effort: Pulmonary effort is normal. No respiratory distress.     Breath sounds: No stridor. Wheezing (to right base) present. No rhonchi or rales.  Chest:     Chest wall: No tenderness.  Abdominal:     General: Abdomen is flat. There is no distension.     Palpations: Abdomen is soft.     Tenderness: There is no abdominal tenderness. There is no guarding or rebound.  Musculoskeletal: Normal range of motion.        General: No swelling, tenderness, deformity or signs of injury.  Neurological:     Mental Status: She is alert.      ED Treatments / Results  Labs (all labs ordered are listed, but only abnormal results are displayed) Labs Reviewed  BASIC METABOLIC PANEL - Abnormal; Notable for the following components:      Result Value   Glucose, Bld 122 (*)    Creatinine, Ser 1.05 (*)    GFR calc non Af Amer 49 (*)    GFR calc Af Amer 57 (*)    All other components within normal limits  SARS CORONAVIRUS 2 (HOSPITAL ORDER, [place] [facility])  CBC  TROPONIN I (HIGH SENSITIVITY)  TROPONIN I (HIGH SENSITIVITY)    EKG EKG Interpretation  Date/Time:  Wednesday December 22 2018 16:25:00 EDT Ventricular Rate:  92 PR Interval:  168 QRS Duration: 130 QT Interval:  404 QTC Calculation: 499 R Axis:   36 Text Interpretation:  Normal sinus rhythm Possible Left atrial enlargement Left bundle branch block Abnormal ECG old LBBB.  no acute change Confirmed by Charlesetta Shanks [phone]) on  12/22/2018 10:57:52 PM   Radiology Dg Chest 2 View  Result Date: 12/22/2018 CLINICAL DATA:  Chest pain, short of breath EXAM: CHEST - 2 VIEW COMPARISON:  05/17/2018 FINDINGS: No focal opacity or pleural effusion. Stable cardiomediastinal silhouette. No pneumothorax. Bilateral breast prostheses. IMPRESSION: No active cardiopulmonary disease. Electronically Signed   By: Donavan Foil M.D.   On: 12/22/2018 18:14    Procedures Procedures (including critical care time)  Medications Ordered in ED Medications  sodium chloride flush (NS) 0.9 % injection 3 mL (has no administration in time range)  metoprolol tartrate (LOPRESSOR) injection 10 mg (has no administration in time range)  acetaminophen (TYLENOL) tablet 650 mg (has no administration in time range)    Or  acetaminophen (TYLENOL) suppository 650 mg (has no administration in time range)  metoprolol tartrate (LOPRESSOR) injection 2.5 mg (2.5 mg Intravenous Given 12/22/18 2323)     Initial Impression / Assessment and Plan / ED Course  I have reviewed the triage vital signs and the nursing notes.  Pertinent labs & imaging results that were available during my care of the patient were reviewed by me and considered in my medical decision making (see chart for details).  Clinical Course as of Aug 13 0001  Wed Dec 22, 2018  2319 Consult: Cardiology Dr. Algernon Huxley advises for admission to hospitalist service.   [MP]    Clinical Course User Index [MP] Charlesetta Shanks, MD       [name] is a 85 y.o. female with history of asthma, DVT on Xarelto, diastolic heart failure, and hypertension presents to the ED complaining of left-sided chest pain for the past 3 days.  Patient reports associated lightheadedness, hypertension, and nausea during this time.  On exam, patient has an S3 gallop and is hypertensive to the 170s.  BMP and CBC are unremarkable.  Chest x-ray shows no acute cardiopulmonary process.  EKG shows left bundle branch block  with no changes compared to prior EKGs.  High-sensitivity troponin was negative x2 in the ED.  Patient's last echocardiogram was in 2018 and showed mild to moderate LVH with LVEF of 55 to 60%.  Given patient's persistent chest pain, S3 gallop, and blood pressure elevated above baseline, cardiology was consulted.  They recommended admission to the hospitalist for blood pressure control and echocardiogram.  The hospitalist was contacted for admission.  They will admit the patient to their service.    Final Clinical Impressions(s) / ED Diagnoses   Final diagnoses:  Chest pain, unspecified type    ED Discharge Orders    None       Candie Chroman, MD 12/23/18 Phillip Heal    Charlesetta Shanks, MD 01/02/19 [phone]

## 2018-12-22 NOTE — ED Triage Notes (Signed)
Pt sent by PCP for reports of CP and SOB since Monday. Pt reports pain is mid sternal with nausea. Hx of CAD, Asthma, and HTN

## 2018-12-22 NOTE — ED Notes (Signed)
ED TO INPATIENT HANDOFF REPORT  ED Nurse Name and Phone #:  [phone]  S Name/Age/Gender Leda Gauze Klier 85 y.o. female Room/Bed: 017C/017C  Code Status   Code Status: Prior  Home/SNF/Other Home Patient oriented to: self, place, time and situation Is this baseline? Yes   Triage Complete: Triage complete  Chief Complaint SOB; Chest Tightness; Hypertension  Triage Note Pt sent by PCP for reports of CP and SOB since Monday. Pt reports pain is mid sternal with nausea. Hx of CAD, Asthma, and HTN   Allergies Allergies  Allergen Reactions  . Eggs Or Egg-Derived Products     UNSPECIFIED REACTION  RAW EGGS.. Can eat cooked eggs  . Influenza Vaccines     UNSPECIFIED REACTION  Egg allergy   . Nucala [Mepolizumab]     UNSPECIFIED REACTION   . Cephalosporins Rash  . Codeine Rash  . Gabapentin Nausea Only  . Levofloxacin Rash  . Pneumococcal Vaccines Rash    Level of Care/Admitting Diagnosis ED Disposition    ED Disposition Condition [facility] Area: [place] [[id number]]  Level of Care: Telemetry Cardiac [103]  I expect the patient will be discharged within 24 hours: Yes  LOW acuity---Tx typically complete <24 hrs---ACUTE conditions typically can be evaluated <24 hours---LABS likely to return to acceptable levels <24 hours---IS near functional baseline---EXPECTED to return to current living arrangement---NOT newly hypoxic: Meets criteria for 5C-Observation unit  Covid Evaluation: Asymptomatic Screening Protocol (No Symptoms)  Diagnosis: Chest pain [387564]  Admitting Physician: Shela Leff [[id number]]  Attending Physician: Shela Leff [[id number]]  PT Class (Do Not Modify): Observation [104]  PT Acc Code (Do Not Modify): Observation [10022]       B Medical/Surgery History Past Medical History:  Diagnosis Date  . Anemia    after hysterectomy  . Asthma, severe persistent    pulmologist-- dr Lynford Citizen  . Cancer [facility])    Breast cancer on right  . Chronic kidney disease, stage II (mild)   . DDD (degenerative disc disease), cervical   . Diverticulitis   . Diverticulosis yrs ago   hx of   . Edema, lower extremity    occ both legs swell  . GERD (gastroesophageal reflux disease)   . Headache    sinus headaches  . Heart murmur    no problems - per pt  . History of breast cancer 1990 left mastectomy also   1989  S/P   RIGHT MASTECTOMY ;  NO CHEMORADIATION //   NO RECURRENCE  . History of DVT of lower extremity 5 yrs ago   right leg  . History of shingles   . Hyperlipidemia   . Internal hemorrhoid   . Leg ulcer, left ([place])   . Osteoporosis, unspecified    Knee and hip osteoarthritis bilaterally  . Perennial allergic rhinitis   . Peripheral vascular disease ([place])   . Pneumonia   . Restless legs syndrome (RLS)   . Rheumatoid arthritis ([place])    hands  . Thyroid nodule    followed by dr perrini yearly, no current problam  . Unspecified essential hypertension    Past Surgical History:  Procedure Laterality Date  . CARDIAC CATHETERIZATION  08/ 01/ 2008   dr Cathie Olden   normal -- EF of 65%  . CARDIOVASCULAR STRESS TEST  05-22-2011   dr Cathie Olden   normal lexiscan perfusion study/  no ischemia/  lvsf  86%  . CATARACT EXTRACTION W/ INTRAOCULAR LENS  IMPLANT, BILATERAL    .  CHOLECYSTECTOMY  1993   laparoscopic  . CONVERSION TO TOTAL HIP Left 12/03/2017   Procedure: CONVERSION TO LEFT TOTAL HIP;  Surgeon: Paralee Cancel, MD;  Location: [facility];  Service: Orthopedics;  Laterality: Left;  90 mins  . HIP ARTHROPLASTY Left 12/22/2016   Procedure: HEMIARTHROPLASTY, LEFT;  Surgeon: Paralee Cancel, MD;  Location: [facility];  Service: Orthopedics;  Laterality: Left;  . INCISION AND DRAINAGE OF WOUND Left 10/24/2013   Procedure: IRRIGATION AND DEBRIDEMENT OF LEFT LEG WITH PLACEMENT OF ACELL AND WOUND VAC;  Surgeon: Theodoro Kos, DO;  Location: [place];  Service: Plastics;  Laterality: Left;  . [place]  . MASTECTOMY Bilateral rigth 1989///   left  1990   breast cancer 1989//   fibrocytic disease 1990  . ORIF HUMERUS FRACTURE Right 09/21/2018   Procedure: OPEN REDUCTION INTERNAL FIXATION (ORIF) PROXIMAL HUMERUS FRACTURE;  Surgeon: Nicholes Stairs, MD;  Location: [place];  Service: Orthopedics;  Laterality: Right;  90 mins  . TOTAL ABDOMINAL HYSTERECTOMY W/ BILATERAL SALPINGOOPHORECTOMY  1989   done at same time as right mastectomy  . TOTAL KNEE ARTHROPLASTY Right 10/05/2012   Procedure: RIGHT TOTAL KNEE ARTHROPLASTY;  Surgeon: Mauri Pole, MD;  Location: [facility];  Service: Orthopedics;  Laterality: Right;     A IV Location/Drains/Wounds Patient Lines/Drains/Airways Status   Active Line/Drains/Airways    Name:   Placement date:   Placement time:   Site:   Days:   Peripheral IV 12/22/18 Left Antecubital   12/22/18    2351    Antecubital   less than 1   Incision (Closed) 12/03/17 Hip Left   12/03/17    0843     384   Incision (Closed) 09/21/18 Arm Right   09/21/18    1026     92          Intake/Output Last 24 hours No intake or output data in the 24 hours ending 12/22/18 2355  Labs/Imaging Results for orders placed or performed during the hospital encounter of 12/22/18 (from the past 48 hour(s))  Basic metabolic panel     Status: Abnormal   Collection Time: 12/22/18  4:23 PM  Result Value Ref Range   Sodium 137 135 - 145 mmol/L   Potassium 3.9 3.5 - 5.1 mmol/L   Chloride 99 98 - 111 mmol/L   CO2 27 22 - 32 mmol/L   Glucose, Bld 122 (H) 70 - 99 mg/dL   BUN 14 8 - 23 mg/dL   Creatinine, Ser 1.05 (H) 0.44 - 1.00 mg/dL   Calcium 8.9 8.9 - 10.3 mg/dL   GFR calc non Af Amer 49 (L) >60 mL/min   GFR calc Af Amer 57 (L) >60 mL/min   Anion gap 11 5 - 15    Comment: Performed at [facility], [address] [place] [address]  CBC     Status: None   Collection Time: 12/22/18  4:23 PM  Result Value Ref Range   WBC 6.0 4.0 - 10.5 K/uL   RBC  4.82 3.87 - 5.11 MIL/uL   Hemoglobin 13.9 12.0 - 15.0 g/dL   HCT 44.3 36.0 - 46.0 %   MCV 91.9 80.0 - 100.0 fL   MCH 28.8 26.0 - 34.0 pg   MCHC 31.4 30.0 - 36.0 g/dL   RDW 14.1 11.5 - 15.5 %   Platelets 271 150 - 400 K/uL   nRBC 0.0 0.0 -  0.2 %    Comment: Performed at [facility], [place] [address] [place], [place] [address]  Troponin I (High Sensitivity)     Status: None   Collection Time: 12/22/18  4:23 PM  Result Value Ref Range   Troponin I (High Sensitivity) 15 <18 ng/L    Comment: (NOTE) Elevated high sensitivity troponin I (hsTnI) values and significant  changes across serial measurements may suggest ACS but many other  chronic and acute conditions are known to elevate hsTnI results.  Refer to the "Links" section for chest pain algorithms and additional  guidance. Performed at [facility], [place] [address] [place], [place] [address]   Troponin I (High Sensitivity)     Status: None   Collection Time: 12/22/18  9:34 PM  Result Value Ref Range   Troponin I (High Sensitivity) 15 <18 ng/L    Comment: (NOTE) Elevated high sensitivity troponin I (hsTnI) values and significant  changes across serial measurements may suggest ACS but many other  chronic and acute conditions are known to elevate hsTnI results.  Refer to the "Links" section for chest pain algorithms and additional  guidance. Performed at [facility], [place] [address], [place], [place] [address]    Dg Chest 2 View  Result Date: 12/22/2018 CLINICAL DATA:  Chest pain, short of breath EXAM: CHEST - 2 VIEW COMPARISON:  05/17/2018 FINDINGS: No focal opacity or pleural effusion. Stable cardiomediastinal silhouette. No pneumothorax. Bilateral breast prostheses. IMPRESSION: No active cardiopulmonary disease. Electronically Signed   By: Donavan Foil M.D.   On: 12/22/2018 18:14    Pending Labs Unresulted Labs (From admission, onward)    Start     Ordered   12/22/18 2327  SARS Coronavirus 2 [facility]  order, Performed in [facility] hospital lab) Nasopharyngeal Nasopharyngeal Swab  (Symptomatic/High Risk of Exposure/Tier 1 Patients Labs with Precautions)  Once,   STAT    Question Answer Comment  Is this test for diagnosis or screening Diagnosis of ill patient   Symptomatic for COVID-19 as defined by CDC Yes   Date of Symptom Onset 12/22/2018   Hospitalized for COVID-19 Yes   Admitted to ICU for COVID-19 No   Previously tested for COVID-19 Yes   Resident in a congregate (group) care setting No   Employed in healthcare setting No   Pregnant No      12/22/18 2326          Vitals/Pain Today's Vitals   12/22/18 2300 12/22/18 2306 12/22/18 2327 12/22/18 2330  BP: (!) 174/88 (!) 175/80  (!) 159/62  Pulse: (!) 104 99  80  Resp: 18 15  16   Temp:      TempSrc:      SpO2: 97% 96%  95%  PainSc:   3      Isolation Precautions No active isolations  Medications Medications  sodium chloride flush (NS) 0.9 % injection 3 mL (has no administration in time range)  metoprolol tartrate (LOPRESSOR) injection 2.5 mg (2.5 mg Intravenous Given 12/22/18 2323)    Mobility walks Low fall risk   Focused Assessments Cardiac Assessment Handoff:  Cardiac Rhythm: Normal sinus rhythm Lab Results  Component Value Date   CKTOTAL 573 (H) 12/11/2006   CKMB 16.2 (H) 12/11/2006   TROPONINI 0.01        NO INDICATION OF MYOCARDIAL INJURY. 12/11/2006   No results found for: DDIMER Does the Patient currently have chest pain? No     R Recommendations: See Admitting Provider  Note  Report given to:   Additional Notes:

## 2018-12-23 ENCOUNTER — Observation Stay (HOSPITAL_COMMUNITY): Payer: Medicare Other

## 2018-12-23 DIAGNOSIS — I10 Essential (primary) hypertension: Secondary | ICD-10-CM | POA: Diagnosis not present

## 2018-12-23 DIAGNOSIS — I130 Hypertensive heart and chronic kidney disease with heart failure and stage 1 through stage 4 chronic kidney disease, or unspecified chronic kidney disease: Secondary | ICD-10-CM | POA: Diagnosis present

## 2018-12-23 DIAGNOSIS — I5032 Chronic diastolic (congestive) heart failure: Secondary | ICD-10-CM | POA: Diagnosis present

## 2018-12-23 DIAGNOSIS — Z7901 Long term (current) use of anticoagulants: Secondary | ICD-10-CM | POA: Diagnosis not present

## 2018-12-23 DIAGNOSIS — R0789 Other chest pain: Secondary | ICD-10-CM | POA: Diagnosis present

## 2018-12-23 DIAGNOSIS — K219 Gastro-esophageal reflux disease without esophagitis: Secondary | ICD-10-CM | POA: Diagnosis not present

## 2018-12-23 DIAGNOSIS — G2581 Restless legs syndrome: Secondary | ICD-10-CM | POA: Diagnosis present

## 2018-12-23 DIAGNOSIS — I160 Hypertensive urgency: Secondary | ICD-10-CM

## 2018-12-23 DIAGNOSIS — N182 Chronic kidney disease, stage 2 (mild): Secondary | ICD-10-CM | POA: Diagnosis present

## 2018-12-23 DIAGNOSIS — Z887 Allergy status to serum and vaccine status: Secondary | ICD-10-CM | POA: Diagnosis not present

## 2018-12-23 DIAGNOSIS — M069 Rheumatoid arthritis, unspecified: Secondary | ICD-10-CM | POA: Diagnosis present

## 2018-12-23 DIAGNOSIS — Z8619 Personal history of other infectious and parasitic diseases: Secondary | ICD-10-CM | POA: Diagnosis not present

## 2018-12-23 DIAGNOSIS — G8929 Other chronic pain: Secondary | ICD-10-CM | POA: Diagnosis present

## 2018-12-23 DIAGNOSIS — Z888 Allergy status to other drugs, medicaments and biological substances status: Secondary | ICD-10-CM | POA: Diagnosis not present

## 2018-12-23 DIAGNOSIS — Z9011 Acquired absence of right breast and nipple: Secondary | ICD-10-CM | POA: Diagnosis not present

## 2018-12-23 DIAGNOSIS — J4550 Severe persistent asthma, uncomplicated: Secondary | ICD-10-CM | POA: Diagnosis present

## 2018-12-23 DIAGNOSIS — I739 Peripheral vascular disease, unspecified: Secondary | ICD-10-CM | POA: Diagnosis present

## 2018-12-23 DIAGNOSIS — Z90710 Acquired absence of both cervix and uterus: Secondary | ICD-10-CM | POA: Diagnosis not present

## 2018-12-23 DIAGNOSIS — Z885 Allergy status to narcotic agent status: Secondary | ICD-10-CM | POA: Diagnosis not present

## 2018-12-23 DIAGNOSIS — Z20828 Contact with and (suspected) exposure to other viral communicable diseases: Secondary | ICD-10-CM | POA: Diagnosis present

## 2018-12-23 DIAGNOSIS — R079 Chest pain, unspecified: Secondary | ICD-10-CM | POA: Diagnosis not present

## 2018-12-23 DIAGNOSIS — M810 Age-related osteoporosis without current pathological fracture: Secondary | ICD-10-CM | POA: Diagnosis present

## 2018-12-23 DIAGNOSIS — Z91012 Allergy to eggs: Secondary | ICD-10-CM | POA: Diagnosis not present

## 2018-12-23 DIAGNOSIS — E785 Hyperlipidemia, unspecified: Secondary | ICD-10-CM | POA: Diagnosis present

## 2018-12-23 DIAGNOSIS — Z86718 Personal history of other venous thrombosis and embolism: Secondary | ICD-10-CM | POA: Diagnosis not present

## 2018-12-23 DIAGNOSIS — Z9049 Acquired absence of other specified parts of digestive tract: Secondary | ICD-10-CM | POA: Diagnosis not present

## 2018-12-23 DIAGNOSIS — Z853 Personal history of malignant neoplasm of breast: Secondary | ICD-10-CM | POA: Diagnosis not present

## 2018-12-23 LAB — ECHOCARDIOGRAM COMPLETE
Height: 64 in
Weight: 2240 oz

## 2018-12-23 LAB — SARS CORONAVIRUS 2 BY RT PCR (HOSPITAL ORDER, PERFORMED IN ~~LOC~~ HOSPITAL LAB): SARS Coronavirus 2: NEGATIVE

## 2018-12-23 MED ORDER — RIVAROXABAN 20 MG PO TABS
20.0000 mg | ORAL_TABLET | Freq: Every day | ORAL | Status: DC
  Administered 2018-12-23: 20 mg via ORAL
  Filled 2018-12-23: qty 1

## 2018-12-23 MED ORDER — ROSUVASTATIN CALCIUM 5 MG PO TABS
5.0000 mg | ORAL_TABLET | Freq: Every day | ORAL | Status: DC
  Administered 2018-12-23 (×2): 5 mg via ORAL
  Filled 2018-12-23 (×2): qty 1

## 2018-12-23 MED ORDER — UMECLIDINIUM BROMIDE 62.5 MCG/INH IN AEPB
1.0000 | INHALATION_SPRAY | Freq: Every day | RESPIRATORY_TRACT | Status: DC
  Administered 2018-12-23 – 2018-12-24 (×2): 1 via RESPIRATORY_TRACT
  Filled 2018-12-23: qty 7

## 2018-12-23 MED ORDER — IRBESARTAN 75 MG PO TABS
75.0000 mg | ORAL_TABLET | Freq: Every day | ORAL | Status: DC
  Administered 2018-12-23 – 2018-12-24 (×2): 75 mg via ORAL
  Filled 2018-12-23 (×2): qty 1

## 2018-12-23 MED ORDER — METOPROLOL TARTRATE 25 MG PO TABS
25.0000 mg | ORAL_TABLET | Freq: Two times a day (BID) | ORAL | Status: DC
  Administered 2018-12-23 – 2018-12-24 (×3): 25 mg via ORAL
  Filled 2018-12-23 (×3): qty 1

## 2018-12-23 MED ORDER — HYDROCODONE-ACETAMINOPHEN 5-325 MG PO TABS: 1.0000 | ORAL_TABLET | ORAL | Status: DC | PRN

## 2018-12-23 MED ORDER — LORATADINE 10 MG PO TABS
10.0000 mg | ORAL_TABLET | Freq: Every day | ORAL | Status: DC
  Administered 2018-12-23 – 2018-12-24 (×2): 10 mg via ORAL
  Filled 2018-12-23 (×2): qty 1

## 2018-12-23 MED ORDER — PRAMIPEXOLE DIHYDROCHLORIDE 0.125 MG PO TABS
0.3750 mg | ORAL_TABLET | Freq: Every day | ORAL | Status: DC
  Administered 2018-12-23: 0.375 mg via ORAL
  Filled 2018-12-23 (×2): qty 3

## 2018-12-23 MED ORDER — IPRATROPIUM-ALBUTEROL 0.5-2.5 (3) MG/3ML IN SOLN: 3.0000 mL | Freq: Four times a day (QID) | RESPIRATORY_TRACT | Status: DC | PRN

## 2018-12-23 MED ORDER — PANTOPRAZOLE SODIUM 40 MG PO TBEC
40.0000 mg | DELAYED_RELEASE_TABLET | Freq: Two times a day (BID) | ORAL | Status: DC
  Administered 2018-12-23 – 2018-12-24 (×3): 40 mg via ORAL
  Filled 2018-12-23 (×3): qty 1

## 2018-12-23 MED ORDER — VITAMIN D 25 MCG (1000 UNIT) PO TABS
1000.0000 [IU] | ORAL_TABLET | Freq: Every evening | ORAL | Status: DC
  Administered 2018-12-23: 1000 [IU] via ORAL
  Filled 2018-12-23: qty 1

## 2018-12-23 MED ORDER — ADULT MULTIVITAMIN W/MINERALS CH
1.0000 | ORAL_TABLET | Freq: Every day | ORAL | Status: DC
  Administered 2018-12-23 – 2018-12-24 (×2): 1 via ORAL
  Filled 2018-12-23 (×2): qty 1

## 2018-12-23 MED ORDER — FLUTICASONE FUROATE-VILANTEROL 200-25 MCG/INH IN AEPB
1.0000 | INHALATION_SPRAY | Freq: Every day | RESPIRATORY_TRACT | Status: DC
  Administered 2018-12-23 – 2018-12-24 (×2): 1 via RESPIRATORY_TRACT
  Filled 2018-12-23: qty 28

## 2018-12-23 MED ORDER — ALBUTEROL SULFATE (2.5 MG/3ML) 0.083% IN NEBU: 3.0000 mL | INHALATION_SOLUTION | RESPIRATORY_TRACT | Status: DC | PRN

## 2018-12-23 MED ORDER — FLUTICASONE PROPIONATE 50 MCG/ACT NA SUSP
1.0000 | Freq: Every day | NASAL | Status: DC
  Administered 2018-12-23 – 2018-12-24 (×2): 1 via NASAL
  Filled 2018-12-23: qty 16

## 2018-12-23 MED ORDER — METOPROLOL TARTRATE 5 MG/5ML IV SOLN: 10.0000 mg | INTRAVENOUS | Status: DC | PRN

## 2018-12-23 MED ORDER — FUROSEMIDE 20 MG PO TABS
20.0000 mg | ORAL_TABLET | Freq: Every day | ORAL | Status: DC
  Administered 2018-12-23 – 2018-12-24 (×2): 20 mg via ORAL
  Filled 2018-12-23 (×2): qty 1

## 2018-12-23 MED ORDER — ASPIRIN 81 MG PO CHEW
324.0000 mg | CHEWABLE_TABLET | Freq: Once | ORAL | Status: AC
  Administered 2018-12-23: 324 mg via ORAL
  Filled 2018-12-23: qty 4

## 2018-12-23 MED ORDER — METOPROLOL TARTRATE 5 MG/5ML IV SOLN
5.0000 mg | INTRAVENOUS | Status: DC | PRN
  Administered 2018-12-23: 5 mg via INTRAVENOUS
  Filled 2018-12-23: qty 5

## 2018-12-23 MED ORDER — SENNOSIDES-DOCUSATE SODIUM 8.6-50 MG PO TABS: 1.0000 | ORAL_TABLET | Freq: Every day | ORAL | Status: DC | PRN

## 2018-12-23 MED ORDER — VITAMIN B-12 1000 MCG PO TABS
500.0000 ug | ORAL_TABLET | Freq: Every day | ORAL | Status: DC
  Administered 2018-12-23 – 2018-12-24 (×2): 500 ug via ORAL
  Filled 2018-12-23 (×2): qty 1

## 2018-12-23 MED ORDER — CALCIUM CARBONATE-VITAMIN D 500-200 MG-UNIT PO TABS
1.0000 | ORAL_TABLET | Freq: Every day | ORAL | Status: DC
  Administered 2018-12-23 – 2018-12-24 (×2): 1 via ORAL
  Filled 2018-12-23 (×2): qty 1

## 2018-12-23 MED ORDER — GABAPENTIN 300 MG PO CAPS: 300.0000 mg | ORAL_CAPSULE | Freq: Every day | ORAL | Status: DC | PRN

## 2018-12-23 MED ORDER — MONTELUKAST SODIUM 10 MG PO TABS
10.0000 mg | ORAL_TABLET | Freq: Every day | ORAL | Status: DC
  Administered 2018-12-23 (×2): 10 mg via ORAL
  Filled 2018-12-23 (×2): qty 1

## 2018-12-23 NOTE — Plan of Care (Signed)
  Problem: Education: Goal: Knowledge of General Education information will improve Description Including pain rating scale, medication(s)/side effects and non-pharmacologic comfort measures Outcome: Progressing   

## 2018-12-23 NOTE — Progress Notes (Signed)
  Echocardiogram 2D Echocardiogram has been performed.  [name] 12/23/2018, 10:56 AM

## 2018-12-23 NOTE — Progress Notes (Signed)
Received to room 3E21 from ER via stretcher. Assisted to bed and positioned for comfort. Oriented to room, bed and unit. In no acute distress.

## 2018-12-23 NOTE — Progress Notes (Addendum)
PROGRESS NOTE  [name] DOB: Mar 13, 1933 DOA: 12/22/2018 PCP: Crist Infante, MD  Brief History    [name] is a 85 y.o. female with medical history significant of severe persistent asthma followed by pulmonology, chronic diastolic congestive heart failure, CKD stage II, GERD, DVT, hyperlipidemia, PVD, rheumatoid arthritis presenting to the hospital for evaluation of chest pain.  Patient reports 2-day history of nonexertional, left-sided, nonradiating, pressure-like chest pain.  States chest pain can occur anytime and sometimes wakes her up from her sleep.  Each episode lasts 2 to 3 minutes.  No associated dyspnea or diaphoresis.  Denies history of coronary artery disease or prior MI.  States her blood pressure has been high for the past 2 days.  Reports compliance with her home blood pressure medications.  She has also been feeling lightheaded for the past 2 days.  No room spinning sensation.  No other complaints.  ED Course: Systolic as high as 194R.  CBC unremarkable.  BMP without significant electrolyte abnormality.  Creatinine 1.0, baseline.  High-sensitivity troponin checked twice negative.  EKG without acute ischemic changes.  COVID-19 rapid test pending.  Chest x-ray personally reviewed showing no active cardiopulmonary disease.  ED provider discussed the case with cardiology who recommended treatment for elevated blood pressure and echocardiogram.  Received IV metoprolol 2.5 mg.   The patient was admitted to a telemetry bed. Blood pressure is responding to antihypertensives and diuresis. She has been ruled out for MI by EKG and enzyme criteria.  Echocardiogram is pending.   Consultants  . None  Procedures  . None  Antibiotics   Anti-infectives (From admission, onward)   None    .   Subjective  The patient is sitting up at bedside. No new complaints. No further chest pain.  Objective   Vitals:  Vitals:   12/23/18 0847 12/23/18 1254  BP: (!) 163/70 (!)  158/70  Pulse: 72 79  Resp:  18  Temp:  97.6 F (36.4 C)  SpO2:  100%    Exam:  Constitutional:  . The patient is awake, alert, and oriented x 3. No acute distress. Respiratory:  . There is no increased work of breathing. . No wheezes, rales, or rhonchi. . No tactile fremitus. Cardiovascular:  . Regular rate and rhythm . No murmurs, ectopy, or gallups . No lateral PMI. No thrills. Abdomen:  . Abdomen is soft, non-tender, non-distended. . No hernias, masses, or organomegaly. . Normoactive bowel sounds.  Musculoskeletal:  . No cyanosis, clubbing or edema. Skin:  . No rashes, lesions, ulcers . palpation of skin: no induration or nodules Neurologic:  . CN 2-12 intact . Sensation all 4 extremities intact Psychiatric:  . Mental status o Mood, affect appropriate o Orientation to person, place, time  . judgment and insight appear intact   I have personally reviewed the following:   Today's Data  . Vitals, Troponins, EKG  Cardiology Data  . Echo pending  Scheduled Meds: . calcium-vitamin D  1 tablet Oral Daily  . cholecalciferol  1,000 Units Oral QPM  . fluticasone  1 spray Each Nare Daily  . fluticasone furoate-vilanterol  1 puff Inhalation Daily  . loratadine  10 mg Oral Daily  . montelukast  10 mg Oral QHS  . multivitamin with minerals  1 tablet Oral Daily  . pantoprazole  40 mg Oral BID  . pramipexole  0.375 mg Oral QHS  . rivaroxaban  20 mg Oral Q supper  . rosuvastatin  5 mg Oral QHS  .  sodium chloride flush  3 mL Intravenous Once  . umeclidinium bromide  1 puff Inhalation Daily  . vitamin B-12  500 mcg Oral Daily   Continuous Infusions:  Principal Problem:   Chest pain Active Problems:   Severe persistent asthma   GERD   Hyperlipidemia   Essential hypertension   LOS: 0 days   A & P  Atypical Chest pain: MI has been ruled out by EKG and enzyme criteria.   Possibly related to elevated blood pressure.  Stress test done in November 2016 was low  risk.  However, does have risk factors for coronary artery disease and heart score 5.  Currently chest pain-free and appears comfortable on exam. The patient was discussed with cardiology in the ED. They recommended echocardiogram and optimization of blood pressure. She is being monitored on telemetry. She will receive an aspirin daily. She will have an EKG PRN recurrence of chest pain  Hypertension: Improved control on IV metoprolol prn. Will change to oral lopressor. Will also restart ARB and lasix. Continue to monitor on telemetry for control of hypertension and bradycardia.  Lightheadedness: Orthostatics negative. No focal neuro deficits. PT/OT eval and treat. Likely due to uncontrolled hypertension. Monitor on telemetry for arrhythmias and for adequate control of hypertension with new addition of lopressor.  Severe persistent asthma: Stable.  No bronchospasm.  Continue home Singulair and inhalers.  History of DVT: Continue home Xarelto.  Chronic diastolic congestive heart failure: Stable.  No signs of volume overload at this time.  Hold home Lasix.  Orthostatics negative.  Chronic pain: Continue home gabapentin PRN, Norco PRN.  GERD: Continue PPI.  Hyperlipidemia: Continue Crestor.  I have seen and examined this patient myself. I have spent 34 minutes in her evaluation and care.  DVT prophylaxis: Xarelto Code Status: Patient wishes to be full code. Family Communication: No family available at this time. Disposition Plan: Anticipate discharge after clinical improvement.  [name] Fong, DO [facility] Direct contact: see [email or web address]  7PM-7AM contact night coverage as above  12/23/2018, 2:12 PM  LOS: 0 days

## 2018-12-24 ENCOUNTER — Other Ambulatory Visit: Payer: Self-pay | Admitting: *Deleted

## 2018-12-24 ENCOUNTER — Telehealth: Payer: Self-pay | Admitting: Internal Medicine

## 2018-12-24 LAB — BASIC METABOLIC PANEL
Anion gap: 8 (ref 5–15)
BUN: 17 mg/dL (ref 8–23)
CO2: 28 mmol/L (ref 22–32)
Calcium: 9.2 mg/dL (ref 8.9–10.3)
Chloride: 100 mmol/L (ref 98–111)
Creatinine, Ser: 1.07 mg/dL — ABNORMAL HIGH (ref 0.44–1.00)
GFR calc Af Amer: 56 mL/min — ABNORMAL LOW (ref >60)
GFR calc non Af Amer: 48 mL/min — ABNORMAL LOW (ref >60)
Glucose, Bld: 113 mg/dL — ABNORMAL HIGH (ref 70–99)
Potassium: 3.9 mmol/L (ref 3.5–5.1)
Sodium: 136 mmol/L (ref 135–145)

## 2018-12-24 MED ORDER — METOPROLOL TARTRATE 25 MG PO TABS: 25.0000 mg | ORAL_TABLET | Freq: Two times a day (BID) | ORAL | 1 refills | Status: DC

## 2018-12-24 NOTE — Consult Note (Signed)
   [facility] CM Inpatient Consult   12/24/2018  [name] Jun 03, 1932 [id number]    Patient was checked ifpotential [place] Management services are needed under her Medicare/ [employer] plan. Review of patient's medical record reveals patient had 2 hospitalizations and 1 ED visit in the past 6 months, with 18% risk score for unplanned readmissions.  Per chart review and MD history & physical dated 12/22/18 states as follows:  [name] is an 85 y.o. female with medical history significant of severe persistent asthma followed by pulmonology ([place]), chronic diastolic congestive heart failure, CKD stage II, GERD, DVT, hyperlipidemia, PVD, rheumatoid arthritis, Presented to the hospital for evaluation of chest pain- nonexertional, left-sided, non radiating, pressure-like.  States her blood pressure has been high for the past 2 days. Reports compliance with her home blood pressure medications.  She has also been feeling lightheaded for the past 2 days. No room spinning sensation. No other complaints. The patient was admitted for treatment of elevated blood pressure and echocardiogram. Blood pressure is responding to antihypertensives and diuresis. She has been ruled out for MI by EKG and enzyme criteria.  Her primary Care Provider isDr. Crist Infante with [facility], listed as providing transition of care.  Patient transitioned to home prior to speaking with her.  Will follow patient with [facility] General calls to monitor recovery.   For questions and additional information, please call:   Deundra Bard A. Meggie Laseter, BSN, RN-BC [facility] Liaison Cell: [phone]

## 2018-12-24 NOTE — Telephone Encounter (Signed)
Fax received from [facility] today.  Fax stated Patient has ordered her Dupixent to be shipped to [facility] Pul[facility] office. Expected delivery date is 12/28/18.

## 2018-12-24 NOTE — Discharge Instructions (Addendum)
Follow with [name] Infante, MD in 5-7 days  Please get a complete blood count and chemistry panel checked by your Primary MD at your next visit, and again as instructed by your Primary MD. Please get your medications reviewed and adjusted by your Primary MD.  Please request your Primary MD to go over all Hospital Tests and Procedure/Radiological results at the follow up, please get all Hospital records sent to your Prim MD by signing hospital release before you go home.  In some cases, there will be blood work, cultures and biopsy results pending at the time of your discharge. Please request that your primary care M.D. goes through all the records of your hospital data and follows up on these results.  If you had Pneumonia of Lung problems at the Hospital: Please get a 2 view Chest X ray done in 6-8 weeks after hospital discharge or sooner if instructed by your Primary MD.  If you have Congestive Heart Failure: Please call your Cardiologist or Primary MD anytime you have any of the following symptoms:  1) 3 pound weight gain in 24 hours or 5 pounds in 1 week  2) shortness of breath, with or without a dry hacking cough  3) swelling in the hands, feet or stomach  4) if you have to sleep on extra pillows at night in order to breathe  Follow cardiac low salt diet and 1.5 lit/day fluid restriction.  If you have diabetes Accuchecks 4 times/day, Once in AM empty stomach and then before each meal. Log in all results and show them to your primary doctor at your next visit. If any glucose reading is under 80 or above 300 call your primary MD immediately.  If you have Seizure/Convulsions/Epilepsy: Please do not drive, operate heavy machinery, participate in activities at heights or participate in high speed sports until you have seen by Primary MD or a Neurologist and advised to do so again. Per [facility] statutes, patients with seizures are not allowed to drive until they have been  seizure-free for six months.  Use caution when using heavy equipment or power tools. Avoid working on ladders or at heights. Take showers instead of baths. Ensure the water temperature is not too high on the home water heater. Do not go swimming alone. Do not lock yourself in a room alone (i.e. bathroom). When caring for infants or small children, sit down when holding, feeding, or changing them to minimize risk of injury to the child in the event you have a seizure. Maintain good sleep hygiene. Avoid alcohol.   If you had Gastrointestinal Bleeding: Please ask your Primary MD to check a complete blood count within one week of discharge or at your next visit. Your endoscopic/colonoscopic biopsies that are pending at the time of discharge, will also need to followed by your Primary MD.  Get Medicines reviewed and adjusted. Please take all your medications with you for your next visit with your Primary MD  Please request your Primary MD to go over all hospital tests and procedure/radiological results at the follow up, please ask your Primary MD to get all Hospital records sent to his/her office.  If you experience worsening of your admission symptoms, develop shortness of breath, life threatening emergency, suicidal or homicidal thoughts you must seek medical attention immediately by calling 911 or calling your MD immediately  if symptoms less severe.  You must read complete instructions/literature along with all the possible adverse reactions/side effects for all the Medicines you take  and that have been prescribed to you. Take any new Medicines after you have completely understood and accpet all the possible adverse reactions/side effects.   Do not drive or operate heavy machinery when taking Pain medications.   Do not take more than prescribed Pain, Sleep and Anxiety Medications  Special Instructions: If you have smoked or chewed Tobacco  in the last 2 yrs please stop smoking, stop any regular  Alcohol  and or any Recreational drug use.  Wear Seat belts while driving.  Please note You were cared for by a hospitalist during your hospital stay. If you have any questions about your discharge medications or the care you received while you were in the hospital after you are discharged, you can call the unit and asked to speak with the hospitalist on call if the hospitalist that took care of you is not available. Once you are discharged, your primary care physician will handle any further medical issues. Please note that NO REFILLS for any discharge medications will be authorized once you are discharged, as it is imperative that you return to your primary care physician (or establish a relationship with a primary care physician if you do not have one) for your aftercare needs so that they can reassess your need for medications and monitor your lab values.  You can reach the hospitalist office at phone [phone] or fax [phone]   If you do not have a primary care physician, you can call [phone] for a physician referral.  Activity: As tolerated with Full fall precautions use walker/cane & assistance as needed    Diet: low sodium  Disposition Home     Information on my medicine - XARELTO (rivaroxaban)  This medication education was reviewed with me or my healthcare representative as part of my discharge preparation.    WHY WAS XARELTO PRESCRIBED FOR YOU? Xarelto was prescribed to treat blood clots that may have been found in the veins of your legs (deep vein thrombosis) or in your lungs (pulmonary embolism) and to reduce the risk of them occurring again.  What do you need to know about Xarelto? the dose is one 20 mg tablet taken ONCE A DAY with your evening meal.  DO NOT stop taking Xarelto without talking to the health care provider who prescribed the medication.  Refill your prescription for 20 mg tablets before you run out.  After discharge, you should have regular  check-up appointments with your healthcare provider that is prescribing your Xarelto.  In the future your dose may need to be changed if your kidney function changes by a significant amount.  What do you do if you miss a dose? If you are taking Xarelto TWICE DAILY and you miss a dose, take it as soon as you remember. You may take two 15 mg tablets (total 30 mg) at the same time then resume your regularly scheduled 15 mg twice daily the next day.  If you are taking Xarelto ONCE DAILY and you miss a dose, take it as soon as you remember on the same day then continue your regularly scheduled once daily regimen the next day. Do not take two doses of Xarelto at the same time.   Important Safety Information Xarelto is a blood thinner medicine that can cause bleeding. You should call your healthcare provider right away if you experience any of the following: ? Bleeding from an injury or your nose that does not stop. ? Unusual colored urine (red or dark brown) or unusual colored  stools (red or black). ? Unusual bruising for unknown reasons. ? A serious fall or if you hit your head (even if there is no bleeding).  Some medicines may interact with Xarelto and might increase your risk of bleeding while on Xarelto. To help avoid this, consult your healthcare provider or pharmacist prior to using any new prescription or non-prescription medications, including herbals, vitamins, non-steroidal anti-inflammatory drugs (NSAIDs) and supplements.  This website has more information on Xarelto: [email or web address]

## 2018-12-24 NOTE — Discharge Summary (Signed)
Physician Discharge Summary  [name] OQH:[id number] DOB: 1933-01-07 DOA: 12/22/2018  PCP: Crist Infante, MD  Admit date: 12/22/2018 Discharge date: 12/24/2018  Admitted From: home Disposition:  home  Recommendations for Outpatient Follow-up:  1. Follow up with PCP in 1-2 weeks  Home Health: none Equipment/Devices: none  Discharge Condition: stable CODE STATUS: Full code Diet recommendation: regular  HPI: Per admitting MD, [name] is a 85 y.o. female with medical history significant of severe persistent asthma followed by pulmonology, chronic diastolic congestive heart failure, CKD stage II, GERD, DVT, hyperlipidemia, PVD, rheumatoid arthritis presenting to the hospital for evaluation of chest pain.  Patient reports 2-day history of nonexertional, left-sided, nonradiating, pressure-like chest pain.  States chest pain can occur anytime and sometimes wakes her up from her sleep.  Each episode lasts 2 to 3 minutes.  No associated dyspnea or diaphoresis.  Denies history of coronary artery disease or prior MI.  States her blood pressure has been high for the past 2 days.  Reports compliance with her home blood pressure medications.  She has also been feeling lightheaded for the past 2 days.  No room spinning sensation.  No other complaints. ED Course: Systolic as high as 546F.  CBC unremarkable.  BMP without significant electrolyte abnormality.  Creatinine 1.0, baseline.  High-sensitivity troponin checked twice negative.  EKG without acute ischemic changes.  COVID-19 rapid test pending.  Chest x-ray personally reviewed showing no active cardiopulmonary disease.  ED provider discussed the case with cardiology who recommended treatment for elevated blood pressure and echocardiogram.  Received IV metoprolol 2.5 mg.   Hospital Course: Atypical chest pain -Patient was admitted to the hospital with chest pain.  Her cardiac enzymes have been negative, EKG nonischemic.  EDP discussed case  with cardiology who recommended a 2D echo and blood pressure optimization since she was quite hypertensive on admission.  Her blood pressure regimen was supplemented with Lopressor in addition to her home medications, her blood pressure improved, her chest pain resolved, and she will be discharged home in stable condition.  Of note she had a normal stress test in November 2016 Hypertension-Lopressor has been added, blood pressure improved, will be discharged home on that in addition to her old home medications Chronic asthma-stable, no bronchospasm History of DVT-continue home Xarelto Chronic diastolic CHF-stable, chronic, no signs of volume overload Chronic pain-continue home medications GERD-continue PPI Hyperlipidemia-continue statin    Discharge Diagnoses:  Principal Problem:   Chest pain Active Problems:   Severe persistent asthma   GERD   Hyperlipidemia   Essential hypertension     Discharge Instructions   Allergies as of 12/24/2018      Reactions   Eggs Or Egg-derived Products    UNSPECIFIED REACTION  RAW EGGS.. Can eat cooked eggs   Influenza Vaccines    UNSPECIFIED REACTION  Egg allergy   Nucala [mepolizumab]    UNSPECIFIED REACTION    Cephalosporins Rash   Codeine Rash   Gabapentin Nausea Only   Levofloxacin Rash   Pneumococcal Vaccines Rash      Medication List    STOP taking these medications   doxycycline 100 MG tablet Commonly known as: VIBRA-TABS   morphine 15 MG tablet Commonly known as: MSIR   predniSONE 10 MG tablet Commonly known as: DELTASONE     TAKE these medications   acetaminophen 500 MG tablet Commonly known as: TYLENOL Take 2 tablets (1,000 mg total) by mouth every 8 (eight) hours. What changed:   when to take this  reasons to take this   AeroChamber MV inhaler by Other route. Use as instructed   albuterol (2.5 MG/3ML) 0.083% nebulizer solution Commonly known as: PROVENTIL Take 3 mLs (2.5 mg total) by nebulization every 6  (six) hours as needed for wheezing or shortness of breath. Dx: J45.41   albuterol 108 (90 Base) MCG/ACT inhaler Commonly known as: ProAir HFA Inhale 2 puffs into the lungs every 6 (six) hours as needed for wheezing or shortness of breath.   benzonatate 100 MG capsule Commonly known as: TESSALON Take 1 capsule (100 mg total) by mouth 3 (three) times daily as needed for cough.   Caltrate 600+D 600-400 MG-UNIT tablet Generic drug: Calcium Carbonate-Vitamin D Take 1 tablet by mouth daily.   chlorpheniramine 4 MG tablet Commonly known as: CHLOR-TRIMETON Take 4-8 mg by mouth See admin instructions. 4 mg in the morning and 8 mg at bedtime   cholecalciferol 25 MCG (1000 UT) tablet Commonly known as: VITAMIN D Take 1,000 Units by mouth every evening.   dextromethorphan 30 MG/5ML liquid Commonly known as: DELSYM Take 60 mg by mouth 2 (two) times daily as needed for cough.   dextromethorphan-guaiFENesin 30-600 MG 12hr tablet Commonly known as: MUCINEX DM Take 1 tablet by mouth daily as needed for cough.   Dupixent 300 MG/2ML prefilled syringe Generic drug: dupilumab INJECT 300 MG SUBCUTANEOUSLY EVERY OTHER WEEK   fexofenadine 180 MG tablet Commonly known as: ALLEGRA Take 180 mg by mouth daily.   furosemide 20 MG tablet Commonly known as: LASIX Take 20 mg by mouth daily.   gabapentin 300 MG capsule Commonly known as: NEURONTIN Take 300 mg by mouth daily as needed for pain.   HYDROcodone-acetaminophen 5-325 MG tablet Commonly known as: NORCO/VICODIN Take 1-2 tablets by mouth every 4 (four) hours as needed for moderate pain (pain score 4-6).   ibandronate 150 MG tablet Commonly known as: BONIVA Take 1 tablet by mouth every 30 (thirty) days.   metoprolol tartrate 25 MG tablet Commonly known as: LOPRESSOR Take 1 tablet (25 mg total) by mouth 2 (two) times daily.   mometasone 50 MCG/ACT nasal spray Commonly known as: Nasonex Place 2 sprays into the nose daily. What  changed: when to take this   montelukast 10 MG tablet Commonly known as: SINGULAIR TAKE 1 TABLET BY MOUTH EVERY DAY AT BEDTIME   multivitamin with minerals Tabs tablet Take 1 tablet by mouth daily. Centrum Silver Multivitamin   NexIUM 40 MG capsule Generic drug: esomeprazole TAKE 1 CAPSULE (40 MG TOTAL) BY MOUTH 2 (TWO) TIMES DAILY. NAME BRAND ONLY What changed: See the new instructions.   potassium chloride 10 MEQ tablet Commonly known as: K-DUR TAKE 1 TABLET BY MOUTH EVERY DAY   pramipexole 0.125 MG tablet Commonly known as: MIRAPEX Take 0.375 mg by mouth at bedtime.   PRESERVISION AREDS PO Take 1 tablet by mouth 2 (two) times daily.   rosuvastatin 5 MG tablet Commonly known as: CRESTOR TAKE 1 TABLET BY MOUTH EVERYDAY AT BEDTIME What changed: See the new instructions.   sennosides-docusate sodium 8.6-50 MG tablet Commonly known as: SENOKOT-S Take 1 tablet by mouth daily as needed for constipation.   Symbicort 160-4.5 MCG/ACT inhaler Generic drug: budesonide-formoterol TAKE 2 PUFFS BY MOUTH TWICE A DAY What changed: See the new instructions.   telmisartan 20 MG tablet Commonly known as: MICARDIS TAKE 1 TABLET BY MOUTH EVERYDAY AT BEDTIME What changed: See the new instructions.   Tiotropium Bromide Monohydrate 2.5 MCG/ACT Aers Commonly known as: Spiriva Respimat  Inhale 2 puffs into the lungs daily.   vitamin B-12 500 MCG tablet Commonly known as: CYANOCOBALAMIN Take 500 mcg by mouth daily.   Xarelto 20 MG Tabs tablet Generic drug: rivaroxaban Take 20 mg by mouth daily with supper.      Follow-up Information    Crist Infante, MD. Schedule an appointment as soon as possible for a visit in 1 week(s).   Specialty: Internal Medicine Contact information: [place] [address] [phone]        Nahser, Wonda Cheng, MD On 01/19/2019.   Specialty: Cardiology Why: Wednesday 01/19/2019 @ 10:30 AM ARRIVE @ 10:15 AM.... Appointment with associate  Pecolia Ades Contact information: [address] [place] [address] [phone]        Crist Infante, MD.   Specialty: Internal Medicine Why: Unable to schedule an appointment, left a voicemail for the scheduler to contact the patient  Contact information: [place] [address] [phone]           Consultations:  None   Procedures/Studies: 2D echo  IMPRESSIONS  1. The left ventricle has normal systolic function, with an ejection fraction of 55-60%. The cavity size was normal. Left ventricular diastolic Doppler parameters are consistent with impaired relaxation. No evidence of left ventricular regional wall  motion abnormalities.  2. The right ventricle has normal systolic function. The cavity was normal. There is no increase in right ventricular wall thickness.  3. Left atrial size was mildly dilated.  4. There is mild to moderate mitral annular calcification present. No evidence of mitral valve stenosis. No mitral regurgitation.  5. The aortic valve is tricuspid. Mild calcification of the aortic valve. No stenosis of the aortic valve.  6. The aorta is normal unless otherwise noted.  7. Normal IVC size. No complete TR doppler jet so unable to estimate PA systolic pressure.   Dg Chest 2 View  Result Date: 12/22/2018 CLINICAL DATA:  Chest pain, short of breath EXAM: CHEST - 2 VIEW COMPARISON:  05/17/2018 FINDINGS: No focal opacity or pleural effusion. Stable cardiomediastinal silhouette. No pneumothorax. Bilateral breast prostheses. IMPRESSION: No active cardiopulmonary disease. Electronically Signed   By: Donavan Foil M.D.   On: 12/22/2018 18:14     Subjective: - no chest pain, shortness of breath, no abdominal pain, nausea or vomiting.   Discharge Exam: BP (!) 165/63 (BP Location: Left Arm)    Pulse 65    Temp (!) 97.5 F (36.4 C) (Oral)    Resp 16    Ht 5\' 4"  (1.626 m)    Wt 62.6 kg    SpO2 96%    BMI 23.70 kg/m   General: Pt  is alert, awake, not in acute distress Cardiovascular: RRR, S1/S2 +, no rubs, no gallops Respiratory: CTA bilaterally, no wheezing, no rhonchi Abdominal: Soft, NT, ND, bowel sounds + Extremities: no edema, no cyanosis   The results of significant diagnostics from this hospitalization (including imaging, microbiology, ancillary and laboratory) are listed below for reference.     Microbiology: Recent Results (from the past 240 hour(s))  SARS Coronavirus 2 [facility] order, Performed in [facility] hospital lab) Nasopharyngeal Nasopharyngeal Swab     Status: None   Collection Time: 12/22/18 11:33 PM   Specimen: Nasopharyngeal Swab  Result Value Ref Range Status   SARS Coronavirus 2 NEGATIVE NEGATIVE Final    Comment: (NOTE) If result is NEGATIVE SARS-CoV-2 target nucleic acids are NOT DETECTED. The SARS-CoV-2 RNA is generally detectable in upper  and lower  respiratory specimens during the acute phase of infection. The lowest  concentration of SARS-CoV-2 viral copies this assay can detect is 250  copies / mL. A negative result does not preclude SARS-CoV-2 infection  and should not be used as the sole basis for treatment or other  patient management decisions.  A negative result may occur with  improper specimen collection / handling, submission of specimen other  than nasopharyngeal swab, presence of viral mutation(s) within the  areas targeted by this assay, and inadequate number of viral copies  (<250 copies / mL). A negative result must be combined with clinical  observations, patient history, and epidemiological information. If result is POSITIVE SARS-CoV-2 target nucleic acids are DETECTED. The SARS-CoV-2 RNA is generally detectable in upper and lower  respiratory specimens dur ing the acute phase of infection.  Positive  results are indicative of active infection with SARS-CoV-2.  Clinical  correlation with patient history and other diagnostic information is  necessary to  determine patient infection status.  Positive results do  not rule out bacterial infection or co-infection with other viruses. If result is PRESUMPTIVE POSTIVE SARS-CoV-2 nucleic acids MAY BE PRESENT.   A presumptive positive result was obtained on the submitted specimen  and confirmed on repeat testing.  While 2019 novel coronavirus  (SARS-CoV-2) nucleic acids may be present in the submitted sample  additional confirmatory testing may be necessary for epidemiological  and / or clinical management purposes  to differentiate between  SARS-CoV-2 and other Sarbecovirus currently known to infect humans.  If clinically indicated additional testing with an alternate test  methodology [phone]) is advised. The SARS-CoV-2 RNA is generally  detectable in upper and lower respiratory sp ecimens during the acute  phase of infection. The expected result is Negative. Fact Sheet for Patients:  StrictlyIdeas.no Fact Sheet for Healthcare Providers: BankingDealers.co.za This test is not yet approved or cleared by the [place] FDA and has been authorized for detection and/or diagnosis of SARS-CoV-2 by FDA under an Emergency Use Authorization (EUA).  This EUA will remain in effect (meaning this test can be used) for the duration of the COVID-19 declaration under Section 564(b)(1) of the Act, 21 U.S.C. section 360bbb-3(b)(1), unless the authorization is terminated or revoked sooner. Performed at [facility], [place] [address] [place], [place] [address]      Labs: BNP (last 3 results) No results for input(s): BNP in the last 8760 hours. Basic Metabolic Panel: Recent Labs  Lab 12/22/18 1623 12/24/18 0547  NA 137 136  K 3.9 3.9  CL 99 100  CO2 27 28  GLUCOSE 122* 113*  BUN 14 17  CREATININE 1.05* 1.07*  CALCIUM 8.9 9.2   Liver Function Tests: No results for input(s): AST, ALT, ALKPHOS, BILITOT, PROT, ALBUMIN in the last 168 hours. No  results for input(s): LIPASE, AMYLASE in the last 168 hours. No results for input(s): AMMONIA in the last 168 hours. CBC: Recent Labs  Lab 12/22/18 1623  WBC 6.0  HGB 13.9  HCT 44.3  MCV 91.9  PLT 271   Cardiac Enzymes: No results for input(s): CKTOTAL, CKMB, CKMBINDEX, TROPONINI in the last 168 hours. BNP: Invalid input(s): POCBNP CBG: No results for input(s): GLUCAP in the last 168 hours. D-Dimer No results for input(s): DDIMER in the last 72 hours. Hgb A1c No results for input(s): HGBA1C in the last 72 hours. Lipid Profile No results for input(s): CHOL, HDL, LDLCALC, TRIG, CHOLHDL, LDLDIRECT in the last 72 hours. Thyroid  function studies No results for input(s): TSH, T4TOTAL, T3FREE, THYROIDAB in the last 72 hours.  Invalid input(s): FREET3 Anemia work up No results for input(s): VITAMINB12, FOLATE, FERRITIN, TIBC, IRON, RETICCTPCT in the last 72 hours. Urinalysis    Component Value Date/Time   COLORURINE YELLOW 12/19/2016 0522   APPEARANCEUR CLEAR 12/19/2016 0522   LABSPEC 1.009 12/19/2016 0522   PHURINE 6.0 12/19/2016 0522   GLUCOSEU NEGATIVE 12/19/2016 0522   HGBUR NEGATIVE 12/19/2016 0522   BILIRUBINUR NEGATIVE 12/19/2016 0522   KETONESUR 5 (A) 12/19/2016 0522   PROTEINUR NEGATIVE 12/19/2016 0522   UROBILINOGEN 0.2 09/27/2012 1349   NITRITE NEGATIVE 12/19/2016 0522   LEUKOCYTESUR TRACE (A) 12/19/2016 0522   Sepsis Labs Invalid input(s): PROCALCITONIN,  WBC,  LACTICIDVEN  FURTHER DISCHARGE INSTRUCTIONS:   Get Medicines reviewed and adjusted: Please take all your medications with you for your next visit with your Primary MD   Laboratory/radiological data: Please request your Primary MD to go over all hospital tests and procedure/radiological results at the follow up, please ask your Primary MD to get all Hospital records sent to his/her office.   In some cases, they will be blood work, cultures and biopsy results pending at the time of your discharge.  Please request that your primary care M.D. goes through all the records of your hospital data and follows up on these results.   Also Note the following: If you experience worsening of your admission symptoms, develop shortness of breath, life threatening emergency, suicidal or homicidal thoughts you must seek medical attention immediately by calling 911 or calling your MD immediately  if symptoms less severe.   You must read complete instructions/literature along with all the possible adverse reactions/side effects for all the Medicines you take and that have been prescribed to you. Take any new Medicines after you have completely understood and accpet all the possible adverse reactions/side effects.    Do not drive when taking Pain medications or sleeping medications (Benzodaizepines)   Do not take more than prescribed Pain, Sleep and Anxiety Medications. It is not advisable to combine anxiety,sleep and pain medications without talking with your primary care practitioner   Special Instructions: If you have smoked or chewed Tobacco  in the last 2 yrs please stop smoking, stop any regular Alcohol  and or any Recreational drug use.   Wear Seat belts while driving.   Please note: You were cared for by a hospitalist during your hospital stay. Once you are discharged, your primary care physician will handle any further medical issues. Please note that NO REFILLS for any discharge medications will be authorized once you are discharged, as it is imperative that you return to your primary care physician (or establish a relationship with a primary care physician if you do not have one) for your post hospital discharge needs so that they can reassess your need for medications and monitor your lab values.  Time coordinating discharge: 40 minutes  SIGNED:  Marzetta Board, MD, PhD 12/24/2018, 2:45 PM

## 2018-12-28 DIAGNOSIS — M25511 Pain in right shoulder: Secondary | ICD-10-CM | POA: Diagnosis not present

## 2018-12-28 NOTE — Telephone Encounter (Signed)
Dupixent Shipment Received: 300mg  #2 prefilled syringe Medication arrival date: 12/28/18 Lot #: [id number] Exp date: 03/12/2021 Received by: Tonna Corner

## 2018-12-30 DIAGNOSIS — M25511 Pain in right shoulder: Secondary | ICD-10-CM | POA: Diagnosis not present

## 2018-12-31 DIAGNOSIS — C50919 Malignant neoplasm of unspecified site of unspecified female breast: Secondary | ICD-10-CM | POA: Diagnosis not present

## 2018-12-31 DIAGNOSIS — N183 Chronic kidney disease, stage 3 (moderate): Secondary | ICD-10-CM | POA: Diagnosis not present

## 2018-12-31 DIAGNOSIS — L0390 Cellulitis, unspecified: Secondary | ICD-10-CM | POA: Diagnosis not present

## 2018-12-31 DIAGNOSIS — I2510 Atherosclerotic heart disease of native coronary artery without angina pectoris: Secondary | ICD-10-CM | POA: Diagnosis not present

## 2018-12-31 DIAGNOSIS — F419 Anxiety disorder, unspecified: Secondary | ICD-10-CM | POA: Diagnosis not present

## 2018-12-31 DIAGNOSIS — E039 Hypothyroidism, unspecified: Secondary | ICD-10-CM | POA: Diagnosis not present

## 2018-12-31 DIAGNOSIS — R079 Chest pain, unspecified: Secondary | ICD-10-CM | POA: Diagnosis not present

## 2018-12-31 DIAGNOSIS — J45909 Unspecified asthma, uncomplicated: Secondary | ICD-10-CM | POA: Diagnosis not present

## 2018-12-31 DIAGNOSIS — I82409 Acute embolism and thrombosis of unspecified deep veins of unspecified lower extremity: Secondary | ICD-10-CM | POA: Diagnosis not present

## 2018-12-31 DIAGNOSIS — I129 Hypertensive chronic kidney disease with stage 1 through stage 4 chronic kidney disease, or unspecified chronic kidney disease: Secondary | ICD-10-CM | POA: Diagnosis not present

## 2018-12-31 DIAGNOSIS — D692 Other nonthrombocytopenic purpura: Secondary | ICD-10-CM | POA: Diagnosis not present

## 2019-01-04 ENCOUNTER — Ambulatory Visit (INDEPENDENT_AMBULATORY_CARE_PROVIDER_SITE_OTHER): Payer: Medicare Other

## 2019-01-04 ENCOUNTER — Other Ambulatory Visit: Payer: Self-pay

## 2019-01-04 DIAGNOSIS — J4550 Severe persistent asthma, uncomplicated: Secondary | ICD-10-CM

## 2019-01-04 DIAGNOSIS — M25511 Pain in right shoulder: Secondary | ICD-10-CM | POA: Diagnosis not present

## 2019-01-04 MED ORDER — DUPILUMAB 300 MG/2ML ~~LOC~~ SOSY
300.0000 mg | PREFILLED_SYRINGE | Freq: Once | SUBCUTANEOUS | Status: AC
  Administered 2019-01-04: 10:00:00 300 mg via SUBCUTANEOUS

## 2019-01-04 NOTE — Progress Notes (Signed)
Have you been hospitalized within the last 10 days?  No- Patient discharged 11 days ago, 12/24/18. Do you have a fever?  No Do you have a cough?  No Do you have a headache or sore throat? No Do you have your Epi Pen visible and is it within date?  Yes   Patient was admitted to [facility] 8/12-8/14 for hypertension.  Patient denied any respiratory issues.  Patient requested I update her med list.  Patient requested Lopressor to be removed and Micardis 40mg  daily to be added.

## 2019-01-06 DIAGNOSIS — M25511 Pain in right shoulder: Secondary | ICD-10-CM | POA: Diagnosis not present

## 2019-01-11 DIAGNOSIS — M25511 Pain in right shoulder: Secondary | ICD-10-CM | POA: Diagnosis not present

## 2019-01-18 ENCOUNTER — Ambulatory Visit (INDEPENDENT_AMBULATORY_CARE_PROVIDER_SITE_OTHER): Payer: Medicare Other

## 2019-01-18 ENCOUNTER — Other Ambulatory Visit: Payer: Self-pay

## 2019-01-18 DIAGNOSIS — J4550 Severe persistent asthma, uncomplicated: Secondary | ICD-10-CM | POA: Diagnosis not present

## 2019-01-18 MED ORDER — DUPILUMAB 300 MG/2ML ~~LOC~~ SOSY
300.0000 mg | PREFILLED_SYRINGE | Freq: Once | SUBCUTANEOUS | Status: AC
  Administered 2019-01-18: 300 mg via SUBCUTANEOUS

## 2019-01-18 NOTE — Progress Notes (Signed)
All questions were answered by the patient before medication was administered. Have you been hospitalized in the last 10 days? No Do you have a fever? No Do you have a cough? No Do you have a headache or sore throat? No  

## 2019-01-19 ENCOUNTER — Other Ambulatory Visit: Payer: Self-pay

## 2019-01-19 ENCOUNTER — Telehealth (INDEPENDENT_AMBULATORY_CARE_PROVIDER_SITE_OTHER): Payer: Medicare Other | Admitting: Cardiology

## 2019-01-19 ENCOUNTER — Encounter: Payer: Self-pay | Admitting: Cardiology

## 2019-01-19 VITALS — BP 121/59 | HR 93 | Ht 64.0 in | Wt 138.0 lb

## 2019-01-19 DIAGNOSIS — E785 Hyperlipidemia, unspecified: Secondary | ICD-10-CM | POA: Diagnosis not present

## 2019-01-19 DIAGNOSIS — R079 Chest pain, unspecified: Secondary | ICD-10-CM | POA: Diagnosis not present

## 2019-01-19 DIAGNOSIS — J4550 Severe persistent asthma, uncomplicated: Secondary | ICD-10-CM

## 2019-01-19 DIAGNOSIS — Z86718 Personal history of other venous thrombosis and embolism: Secondary | ICD-10-CM

## 2019-01-19 DIAGNOSIS — I5032 Chronic diastolic (congestive) heart failure: Secondary | ICD-10-CM | POA: Diagnosis not present

## 2019-01-19 DIAGNOSIS — I10 Essential (primary) hypertension: Secondary | ICD-10-CM

## 2019-01-19 NOTE — Patient Instructions (Addendum)
Medication Instructions:  Your physician recommends that you continue on your current medications as directed. Please refer to the Current Medication list given to you today.  If you need a refill on your cardiac medications before your next appointment, please call your pharmacy.   Lab work: None  If you have labs (blood work) drawn today and your tests are completely normal, you will receive your results only by: [name] MyChart Message (if you have MyChart) OR . A paper copy in the mail If you have any lab test that is abnormal or we need to change your treatment, we will call you to review the results.  Testing/Procedures: None   Follow-Up: At [facility], you and your health needs are our priority.  As part of our continuing mission to provide you with exceptional heart care, we have created designated Provider Care Teams.  These Care Teams include your primary Cardiologist (physician) and Advanced Practice Providers (APPs -  Physician Assistants and Nurse Practitioners) who all work together to provide you with the care you need, when you need it. You will need a follow up appointment in 4-6 months.  Please call our office 2 months in advance to schedule this appointment.  You may see Mertie Moores, MD or one of the following Advanced Practice Providers on your designated Care Team:   Tyro, PA-C Melina Copa, PA-C . Ermalinda Barrios, PA-C  Any Other Special Instructions Will Be Listed Below (If Applicable).   Lifestyle Modifications to Prevent and Treat Heart Disease -Recommend heart healthy/Mediterranean diet, with whole grains, fruits, vegetables, fish, lean meats, nuts, olive oil and avocado oil.  -Limit salt intake to less than 1500 mg per day.  -Recommend moderate walking, starting slowly with a few minutes and working up to 3-5 times/week -Recommend avoidance of tobacco products. Avoid excess alcohol. -Keep blood pressure well controlled, ideally less than 130/80.

## 2019-01-19 NOTE — Progress Notes (Signed)
Virtual Visit via Telephone Note   This visit type was conducted due to national recommendations for restrictions regarding the COVID-19 Pandemic (e.g. social distancing) in an effort to limit this patient's exposure and mitigate transmission in our community.  Due to her co-morbid illnesses, this patient is at least at moderate risk for complications without adequate follow up.  This format is felt to be most appropriate for this patient at this time.  The patient did not have access to video technology/had technical difficulties with video requiring transitioning to audio format only (telephone).  All issues noted in this document were discussed and addressed.  No physical exam could be performed with this format.  Please refer to the patient's chart for her  consent to telehealth for [facility].   Date:  01/19/2019   ID:  [name], DOB Jul 22, 1932, MRN [id number]  Patient Location: Home Provider Location: Office  PCP:  Crist Infante, MD  Cardiologist:  Mertie Moores, MD  Electrophysiologist:  None   Evaluation Performed:  Follow-Up Visit  Chief Complaint: Hospital follow-up, chest pain and hypertension  History of Present Illness:    [name] Is a 85 y.o. female with a past medical history significant for chest pain with normal coronary arteries by heart cath in 12/2006, breast cancer s/p right mastectomy 1989 & left mastectomy 1990, hypertension, hyperlipidemia, severe persistent asthma followed by pulmonology, chronic diastolic CHF, CKD stage II, GERD, DVT, PVD and rheumatoid arthritis.  Patient had normal Myoview in 03/2015.  She was last seen in the office by Dr. Acie Fredrickson on 03/19/2018.  Notes indicated that the patient has lots of breathing issues related to asthma but she has remained stable from a cardiac standpoint.  She has a history of chest discomfort that was felt to be related to GI issues.  The patient was recently admitted to the hospital 12/22/2018-12/24/2018 for  evaluation of nonexertional pressure-like chest pain.  EKG was without ischemic changes.  High-sensitivity troponin was negative x2.  COVID-19 testing was negative.  Chest x-ray showed no active cardiopulmonary disease.  Notes indicate that there were no signs of volume overload on exam.  She was not seen by cardiology but our service did recommend blood pressure optimization and echocardiogram.  Echocardiogram on 12/23/2018 showed normal LV systolic function with EF 0000000, mild diastolic dysfunction, no regional wall motion abnormalities.  Lopressor was added with improvement in blood pressure.  Today [name] says that she is doing very well.  She says that regarding her recent hospitalization she actually presented to her PCP office with complaints of dizziness and he recommended her to go to the hospital for some lab tests and they ended up keeping her.  She says that she had some mild chest pain when her blood pressure was elevated but it improved once her blood pressure normalized.  Posthospitalization she had to stop the metoprolol as it caused her excessive fatigue and inability to sleep.  Her PCP Dr. Joylene Draft increased her telmisartan to 40 mg with improvement in her blood pressure.  The dose was then reduced back to 20 mg with continued normal blood pressures, in the 120s.  The patient says they are not sure what caused her blood pressure to be transiently elevated, but she is confident that it is currently well controlled.  She occasionally has chest tightness related to her asthma.  She denies lower extremity edema, orthopnea, PND or shortness of breath.  The patient does not have symptoms concerning for COVID-19 infection (fever, chills,  cough, or new shortness of breath).    Past Medical History:  Diagnosis Date   Anemia    after hysterectomy   Asthma, severe persistent    pulmologist-- dr Lynford Citizen   Cancer [facility])    Breast cancer on right   Chronic kidney disease, stage II (mild)      DDD (degenerative disc disease), cervical    Diverticulitis    Diverticulosis yrs ago   hx of    Edema, lower extremity    occ both legs swell   GERD (gastroesophageal reflux disease)    Headache    sinus headaches   Heart murmur    no problems - per pt   History of breast cancer 1990 left mastectomy also   1989  S/P   RIGHT MASTECTOMY ;  NO CHEMORADIATION //   NO RECURRENCE   History of DVT of lower extremity 5 yrs ago   right leg   History of shingles    Hyperlipidemia    Internal hemorrhoid    Leg ulcer, left (HCC)    Osteoporosis, unspecified    Knee and hip osteoarthritis bilaterally   Perennial allergic rhinitis    Peripheral vascular disease (HCC)    Pneumonia    Restless legs syndrome (RLS)    Rheumatoid arthritis ([place])    hands   Thyroid nodule    followed by dr perrini yearly, no current problam   Unspecified essential hypertension    Past Surgical History:  Procedure Laterality Date   CARDIAC CATHETERIZATION  08/ 01/ 2008   dr Cathie Olden   normal -- EF of 65%   CARDIOVASCULAR STRESS TEST  05-22-2011   dr Cathie Olden   normal lexiscan perfusion study/  no ischemia/  lvsf  86%   CATARACT EXTRACTION W/ INTRAOCULAR LENS  IMPLANT, BILATERAL     CHOLECYSTECTOMY  1993   laparoscopic   CONVERSION TO TOTAL HIP Left 12/03/2017   Procedure: CONVERSION TO LEFT TOTAL HIP;  Surgeon: Paralee Cancel, MD;  Location: [facility];  Service: Orthopedics;  Laterality: Left;  90 mins   HIP ARTHROPLASTY Left 12/22/2016   Procedure: HEMIARTHROPLASTY, LEFT;  Surgeon: Paralee Cancel, MD;  Location: [facility];  Service: Orthopedics;  Laterality: Left;   INCISION AND DRAINAGE OF WOUND Left 10/24/2013   Procedure: IRRIGATION AND DEBRIDEMENT OF LEFT LEG WITH PLACEMENT OF ACELL AND WOUND [place];  Surgeon: Theodoro Kos, DO;  Location: [place];  Service: Plastics;  Laterality: Left;   [place] Bilateral rigth 1989///   left   1990   breast cancer 1989//   fibrocytic disease 1990   ORIF HUMERUS FRACTURE Right 09/21/2018   Procedure: OPEN REDUCTION INTERNAL FIXATION (ORIF) PROXIMAL HUMERUS FRACTURE;  Surgeon: Nicholes Stairs, MD;  Location: [place];  Service: Orthopedics;  Laterality: Right;  90 mins   TOTAL ABDOMINAL HYSTERECTOMY W/ BILATERAL SALPINGOOPHORECTOMY  1989   done at same time as right mastectomy   TOTAL KNEE ARTHROPLASTY Right 10/05/2012   Procedure: RIGHT TOTAL KNEE ARTHROPLASTY;  Surgeon: Mauri Pole, MD;  Location: [facility];  Service: Orthopedics;  Laterality: Right;     Current Meds  Medication Sig   acetaminophen (TYLENOL) 500 MG tablet Take 2 tablets (1,000 mg total) by mouth every 8 (eight) hours. (Patient taking differently: Take 1,000 mg by mouth every 6 (six) hours as needed (pain). )   albuterol (PROAIR HFA) 108 (90 Base) MCG/ACT inhaler Inhale 2 puffs into the  lungs every 6 (six) hours as needed for wheezing or shortness of breath.   albuterol (PROVENTIL) (2.5 MG/3ML) 0.083% nebulizer solution Take 3 mLs (2.5 mg total) by nebulization every 6 (six) hours as needed for wheezing or shortness of breath. Dx: J45.41   benzonatate (TESSALON) 100 MG capsule Take 1 capsule (100 mg total) by mouth 3 (three) times daily as needed for cough.   Calcium Carbonate-Vitamin D (CALTRATE 600+D) 600-400 MG-UNIT per tablet Take 1 tablet by mouth daily.    chlorpheniramine (CHLOR-TRIMETON) 4 MG tablet Take 4-8 mg by mouth See admin instructions. 4 mg in the morning and 8 mg at bedtime    Cholecalciferol (VITAMIN D3) 1000 UNITS tablet Take 1,000 Units by mouth every evening.    dextromethorphan (DELSYM) 30 MG/5ML liquid Take 60 mg by mouth 2 (two) times daily as needed for cough.   dextromethorphan-guaiFENesin (MUCINEX DM) 30-600 MG 12hr tablet Take 1 tablet by mouth daily as needed for cough.   DUPIXENT 300 MG/2ML prefilled syringe INJECT 300 MG SUBCUTANEOUSLY EVERY OTHER WEEK   fexofenadine  (ALLEGRA) 180 MG tablet Take 180 mg by mouth daily.     furosemide (LASIX) 20 MG tablet Take 20 mg by mouth daily.   gabapentin (NEURONTIN) 300 MG capsule Take 300 mg by mouth daily as needed for pain.   HYDROcodone-acetaminophen (NORCO/VICODIN) 5-325 MG tablet Take 1-2 tablets by mouth every 4 (four) hours as needed for moderate pain (pain score 4-6).   ibandronate (BONIVA) 150 MG tablet Take 1 tablet by mouth every 30 (thirty) days.    mometasone (NASONEX) 50 MCG/ACT nasal spray Place 2 sprays into the nose daily. (Patient taking differently: Place 2 sprays into the nose 2 (two) times a day. )   montelukast (SINGULAIR) 10 MG tablet TAKE 1 TABLET BY MOUTH EVERY DAY AT BEDTIME (Patient taking differently: Take 10 mg by mouth at bedtime. )   Multiple Vitamin (MULTIVITAMIN WITH MINERALS) TABS tablet Take 1 tablet by mouth daily. Centrum Silver Multivitamin   Multiple Vitamins-Minerals (PRESERVISION AREDS PO) Take 1 tablet by mouth 2 (two) times daily.   NEXIUM 40 MG capsule TAKE 1 CAPSULE (40 MG TOTAL) BY MOUTH 2 (TWO) TIMES DAILY. NAME BRAND ONLY (Patient taking differently: Take 40 mg by mouth 2 (two) times daily. )   potassium chloride (K-DUR) 10 MEQ tablet TAKE 1 TABLET BY MOUTH EVERY DAY (Patient taking differently: Take 10 mEq by mouth daily. )   pramipexole (MIRAPEX) 0.125 MG tablet Take 0.375 mg by mouth at bedtime.    rosuvastatin (CRESTOR) 5 MG tablet TAKE 1 TABLET BY MOUTH EVERYDAY AT BEDTIME (Patient taking differently: Take 5 mg by mouth at bedtime. )   sennosides-docusate sodium (SENOKOT-S) 8.6-50 MG tablet Take 1 tablet by mouth daily as needed for constipation.   Spacer/Aero-Holding Chambers (AEROCHAMBER MV) inhaler by Other route. Use as instructed    SYMBICORT 160-4.5 MCG/ACT inhaler TAKE 2 PUFFS BY MOUTH TWICE A DAY (Patient taking differently: Inhale 2 puffs into the lungs 2 (two) times a day. )   telmisartan (MICARDIS) 20 MG tablet Take 20 mg by mouth daily.     Tiotropium Bromide Monohydrate (SPIRIVA RESPIMAT) 2.5 MCG/ACT AERS Inhale 2 puffs into the lungs daily.   vitamin B-12 (CYANOCOBALAMIN) 500 MCG tablet Take 500 mcg by mouth daily.     XARELTO 20 MG TABS tablet Take 20 mg by mouth daily with supper.    Current Facility-Administered Medications for the 01/19/19 encounter (Telemedicine) with Daune Perch, NP  Medication  omalizumab Arvid Right) injection 300 mg   omalizumab Arvid Right) injection 300 mg   omalizumab Arvid Right) injection 300 mg     Allergies:   Eggs or egg-derived products, Influenza vaccines, Nucala [mepolizumab], Cephalosporins, Codeine, Gabapentin, Levofloxacin, and Pneumococcal vaccines   Social History   Tobacco Use   Smoking status: Never Smoker   Smokeless tobacco: Never Used  Substance Use Topics   Alcohol use: No   Drug use: No     Family Hx: The patient's family history includes Asthma in her brother and mother; Cancer in her brother; Coronary artery disease in her sister; Deep vein thrombosis in her father; Heart attack in her father and mother; Heart disease in her father, mother, and sister; Heart failure in her father; Hyperlipidemia in her mother; Peripheral vascular disease in her father.  ROS:   Please see the history of present illness.     All other systems reviewed and are negative.   Prior CV studies:   The following studies were reviewed today:   Echocardiogram 12/23/2018 IMPRESSIONS   1. The left ventricle has normal systolic function, with an ejection fraction of 55-60%. The cavity size was normal. Left ventricular diastolic Doppler parameters are consistent with impaired relaxation. No evidence of left ventricular regional wall  motion abnormalities.  2. The right ventricle has normal systolic function. The cavity was normal. There is no increase in right ventricular wall thickness.  3. Left atrial size was mildly dilated.  4. There is mild to moderate mitral annular calcification  present. No evidence of mitral valve stenosis. No mitral regurgitation.  5. The aortic valve is tricuspid. Mild calcification of the aortic valve. No stenosis of the aortic valve.  6. The aorta is normal unless otherwise noted.  7. Normal IVC size. No complete TR doppler jet so unable to estimate PA systolic pressure.   Labs/Other Tests and Data Reviewed:    EKG:  An ECG dated 12/22/18 was personally reviewed today and demonstrated:  SR  Recent Labs: 12/22/2018: Hemoglobin 13.9; Platelets 271 12/24/2018: BUN 17; Creatinine, Ser 1.07; Potassium 3.9; Sodium 136   Recent Lipid Panel Lab Results  Component Value Date/Time   CHOL 186 03/19/2017 10:09 AM   TRIG 57 03/19/2017 10:09 AM   HDL 114 03/19/2017 10:09 AM   CHOLHDL 1.6 03/19/2017 10:09 AM   CHOLHDL 2 02/23/2014 09:07 AM   LDLCALC 61 03/19/2017 10:09 AM    Wt Readings from Last 3 Encounters:  01/19/19 138 lb (62.6 kg)  12/24/18 138 lb 1.6 oz (62.6 kg)  09/21/18 160 lb (72.6 kg)     Objective:    Vital Signs:  BP (!) 121/59    Pulse 93    Ht 5\' 4"  (1.626 m)    Wt 138 lb (62.6 kg)    BMI 23.69 kg/m    VITAL SIGNS:  reviewed GEN:  no acute distress RESPIRATORY:  No audible cough or wheeze.  Patient able to speak in complete sentences. NEURO:  alert and oriented x 3, no obvious focal deficit PSYCH:  normal affect  ASSESSMENT & PLAN:     Chest pain -Recent hospitalization for evaluation of chest pain.  She ruled out for MI.  Patient has history of normal coronary arteries by cath in 2008, normal stress testing 2016. -Patient had transient chest pressure while her blood pressure was elevated which resolved once blood pressure normalized.  Symptoms were felt to be related to poorly controlled hypertension.  Echocardiogram showed normal LV systolic function and no regional wall  motion abnormalities. -She has occasional chest tightness with her asthma but she denies any cardiac related chest pain since the  hospitalization.  Hypertension -Lopressor was added at recent hospitalization in addition to her previous medications.  The patient did not tolerate Lopressor due to fatigue and inability to sleep.  Beta-blocker was probably added due to heart rate in the 90s but this is not an ideal medication considering her severe asthma.  Her PCP increased her telmisartan for a few days with improvement in blood pressure and she is back on her normal 20 mg daily with well-controlled blood pressures. -No changes at this time.  Chronic diastolic heart failure -Echo during recent hospitalization, 12/23/2018 showed normal LV systolic function with mild diastolic dysfunction.  Patient was volume stable while in the hospital. -Continues on Lasix 20 mg daily. -Patient is stable without symptoms of volume overload.  Hyperlipidemia -On rosuvastatin 5 mg daily  Persistent severe asthma -Followed by pulmonology. On Omalizumab injections, bronchodilators. She says that this injection has been the best treatment that she has had.   History of DVT -On Xarelto 20 mg daily (recent creatinine level 1.07)    COVID-19 Education: The signs and symptoms of COVID-19 were discussed with the patient and how to seek care for testing (follow up with PCP or arrange E-visit).  The importance of social distancing was discussed today.  Time:   Today, I have spent 13 minutes with the patient with telehealth technology discussing the above problems.     Medication Adjustments/Labs and Tests Ordered: Current medicines are reviewed at length with the patient today.  Concerns regarding medicines are outlined above.   Tests Ordered: No orders of the defined types were placed in this encounter.   Medication Changes: No orders of the defined types were placed in this encounter.   Follow Up:  In Person in 6 month(s)  Signed, Daune Perch, NP  01/19/2019 11:08 AM    [place]

## 2019-01-20 DIAGNOSIS — M25511 Pain in right shoulder: Secondary | ICD-10-CM | POA: Diagnosis not present

## 2019-01-25 DIAGNOSIS — M25511 Pain in right shoulder: Secondary | ICD-10-CM | POA: Diagnosis not present

## 2019-01-26 ENCOUNTER — Telehealth: Payer: Self-pay | Admitting: Internal Medicine

## 2019-01-26 NOTE — Telephone Encounter (Signed)
Dupixent Shipment Received: 300mg  #2 prefilled syringe Medication arrival date: 01/26/19 Lot #: [id number] Exp date: 05/12/2021 Received by: Elliot Dally

## 2019-01-27 DIAGNOSIS — M7541 Impingement syndrome of right shoulder: Secondary | ICD-10-CM | POA: Diagnosis not present

## 2019-01-27 DIAGNOSIS — S42201A Unspecified fracture of upper end of right humerus, initial encounter for closed fracture: Secondary | ICD-10-CM | POA: Diagnosis not present

## 2019-02-01 ENCOUNTER — Other Ambulatory Visit: Payer: Self-pay

## 2019-02-01 ENCOUNTER — Ambulatory Visit (INDEPENDENT_AMBULATORY_CARE_PROVIDER_SITE_OTHER): Payer: Medicare Other

## 2019-02-01 DIAGNOSIS — M25511 Pain in right shoulder: Secondary | ICD-10-CM | POA: Diagnosis not present

## 2019-02-01 DIAGNOSIS — J4550 Severe persistent asthma, uncomplicated: Secondary | ICD-10-CM

## 2019-02-01 MED ORDER — DUPILUMAB 300 MG/2ML ~~LOC~~ SOSY
300.0000 mg | PREFILLED_SYRINGE | Freq: Once | SUBCUTANEOUS | Status: AC
  Administered 2019-02-01: 300 mg via SUBCUTANEOUS

## 2019-02-01 NOTE — Progress Notes (Signed)
Have you been hospitalized within the last 10 days?  No Do you have a fever?  No Do you have a cough?  No Do you have a headache or sore throat? No Do you have your Epi Pen visible and is it within date?  Yes 

## 2019-02-10 DIAGNOSIS — Z96642 Presence of left artificial hip joint: Secondary | ICD-10-CM | POA: Diagnosis not present

## 2019-02-10 DIAGNOSIS — Z471 Aftercare following joint replacement surgery: Secondary | ICD-10-CM | POA: Diagnosis not present

## 2019-02-14 ENCOUNTER — Ambulatory Visit: Payer: Medicare Other

## 2019-02-16 ENCOUNTER — Ambulatory Visit (INDEPENDENT_AMBULATORY_CARE_PROVIDER_SITE_OTHER): Payer: Medicare Other

## 2019-02-16 ENCOUNTER — Other Ambulatory Visit: Payer: Self-pay

## 2019-02-16 DIAGNOSIS — J4550 Severe persistent asthma, uncomplicated: Secondary | ICD-10-CM | POA: Diagnosis not present

## 2019-02-16 MED ORDER — DUPILUMAB 300 MG/2ML ~~LOC~~ SOSY
300.0000 mg | PREFILLED_SYRINGE | Freq: Once | SUBCUTANEOUS | Status: AC
  Administered 2019-02-16: 09:00:00 300 mg via SUBCUTANEOUS

## 2019-02-16 NOTE — Progress Notes (Signed)
All questions were answered by the patient before medication was administered. Have you been hospitalized in the last 10 days? No Do you have a fever? No Do you have a cough? No Do you have a headache or sore throat? No  

## 2019-02-19 ENCOUNTER — Other Ambulatory Visit: Payer: Self-pay | Admitting: Internal Medicine

## 2019-02-21 ENCOUNTER — Telehealth: Payer: Self-pay | Admitting: Internal Medicine

## 2019-02-21 NOTE — Telephone Encounter (Signed)
Per our records, pt is overdue for an OV. LMTCB x1 for pt.

## 2019-02-22 MED ORDER — SPIRIVA RESPIMAT 2.5 MCG/ACT IN AERS: 2.0000 | INHALATION_SPRAY | Freq: Every day | RESPIRATORY_TRACT | 0 refills | Status: DC

## 2019-02-22 NOTE — Telephone Encounter (Signed)
Pt scheduled 10/19 for appt with dr. Chase Caller.

## 2019-02-22 NOTE — Telephone Encounter (Signed)
I sent in one refill for Spiriva. Pt has appt on 10/19.

## 2019-02-22 NOTE — Addendum Note (Signed)
Addended by: Jannette Spanner on: 02/22/2019 01:54 PM   Modules accepted: Orders

## 2019-02-22 NOTE — Telephone Encounter (Signed)
LMTCB x2 for pt 

## 2019-02-23 ENCOUNTER — Telehealth: Payer: Self-pay | Admitting: Internal Medicine

## 2019-02-23 NOTE — Telephone Encounter (Signed)
Dupixent Shipment Received: 300mg  #2 prefilled syringe Medication arrival date: 02/23/19 Lot #: [id number] Exp date: 06/12/2021 Received by: Elliot Dally

## 2019-02-25 DIAGNOSIS — E038 Other specified hypothyroidism: Secondary | ICD-10-CM | POA: Diagnosis not present

## 2019-02-25 DIAGNOSIS — E7849 Other hyperlipidemia: Secondary | ICD-10-CM | POA: Diagnosis not present

## 2019-02-25 DIAGNOSIS — M810 Age-related osteoporosis without current pathological fracture: Secondary | ICD-10-CM | POA: Diagnosis not present

## 2019-02-25 DIAGNOSIS — R7301 Impaired fasting glucose: Secondary | ICD-10-CM | POA: Diagnosis not present

## 2019-02-28 ENCOUNTER — Other Ambulatory Visit: Payer: Self-pay

## 2019-02-28 ENCOUNTER — Encounter: Payer: Self-pay | Admitting: Internal Medicine

## 2019-02-28 ENCOUNTER — Ambulatory Visit (INDEPENDENT_AMBULATORY_CARE_PROVIDER_SITE_OTHER): Payer: Medicare Other | Admitting: Internal Medicine

## 2019-02-28 VITALS — BP 128/74 | HR 109 | Temp 97.1°F | Ht 64.0 in | Wt 142.0 lb

## 2019-02-28 DIAGNOSIS — D509 Iron deficiency anemia, unspecified: Secondary | ICD-10-CM | POA: Diagnosis not present

## 2019-02-28 DIAGNOSIS — J322 Chronic ethmoidal sinusitis: Secondary | ICD-10-CM | POA: Diagnosis not present

## 2019-02-28 DIAGNOSIS — R768 Other specified abnormal immunological findings in serum: Secondary | ICD-10-CM | POA: Diagnosis not present

## 2019-02-28 DIAGNOSIS — J4550 Severe persistent asthma, uncomplicated: Secondary | ICD-10-CM | POA: Diagnosis not present

## 2019-02-28 DIAGNOSIS — R7989 Other specified abnormal findings of blood chemistry: Secondary | ICD-10-CM | POA: Diagnosis not present

## 2019-02-28 DIAGNOSIS — Z79899 Other long term (current) drug therapy: Secondary | ICD-10-CM | POA: Diagnosis not present

## 2019-02-28 DIAGNOSIS — D649 Anemia, unspecified: Secondary | ICD-10-CM | POA: Diagnosis not present

## 2019-02-28 NOTE — Addendum Note (Signed)
Addended by: Suzzanne Cloud E on: 02/28/2019 10:16 AM   Modules accepted: Orders

## 2019-02-28 NOTE — Progress Notes (Signed)
C.C.:  Follow-up for Severe, Persistent Asthma, Chronic Allergic Rhinitis, DVT, & GERD w/ Hiatal Hernia.  HPI Patient has been seen twice in our office since her last appointment with me. Last visit was on 9/26 with nurse practitioner. Patient was treated for an exacerbation of her asthma with bronchitis. She was given a prednisone taper, instructed to use Mucinex twice daily as needed, and a 7 day course of Augmentin. Since her last appointment she fractured her left hip. She is continuing to walk with a cane. She reports no adverse effects from her Xolair. She does feel the Xolair is helping her significantly. Hasn't needed her rescue inhaler significantly in the last few weeks. Only rare nocturnal awakenings with any dyspnea.   Severe, persistent asthma: Patient currently maintained on a regimen of Xolair, Singulair, Spiriva, and Symbicort. She feels her dyspnea has significantly improved. She still has a scratchy throat & hoarse voice. She has her baseline nonproductive cough. No wheezing.  Chronic allergic rhinitis: Previously referred to ENT. Regimen at last appointment with me included Allegra, Singulair, Nasonex, and Xolair. Patient has had multiple different antibiotic courses for recurrent sinusitis. Only having intermittent sinus congestion & drainage.   DVT: Present in bilateral lower extremities. Patient on systemic anticoagulation with Xarelto indefinitely. No melena or hematochezia. No hematuria.   GERD with hiatal hernia: Previously prescribed Nexium. No reflux or dyspepsia. No morning brash water taste.   Review of Systems  No chest pain or tightness. No fever or chills. No abdominal pain or nausea.    OV 06/15/2017  Chief Complaint  Patient presents with   Follow-up    flare ups with asthma when the weather changes.  Right now it is a cough, with congestions and wheezing. Non-productive dry cough. SOB with any activity.  Hoarseness.   85 year old female.  Transfer of  care from Dr. Ashok Cordia who is left the practice.  Moderate persistent asthma with an elevated IgE.  She is here with her husband.  She tells me that in 2015-2017 she was started on Xolair and then subsequently switched in [place] last year or year before last.  This then caused nonspecific side effect such as erythema in her face and nonspecific pain that reminded her of shingles.  Therefore she discontinued this and the side effects resolved.  She did have 6 doses of the interleukin-5 receptor antibody.  She went back on Xolair.  Overall she feels that since starting Xolair her quality of life and asthma exacerbations have improved but nevertheless she still gets asthma exacerbations every few months and has to go on prednisone.  Currently she states that for the last few to several days she has had increased cough although no change in sputum.  She is also wheezing a little bit more than usual.  Asthma control questionnaire shows for the last week she is waking up a few times at night.  When she wakes up she has moderate symptoms.  Her activities are slightly limited because of asthma.  She is moderate amount of shortness of breath because of asthma and she is wheezing a little of the time and using albuterol for rescue at least 1 or 2 times daily.  Exam nitric oxide is significantly elevated at 125 ppb.  She think she will benefit from prednisone  She she reports to me for the first time a new issue of left anterior cervical lymph node/submandibular lymph node.  She says it is been present for a while but other  physicians were examined it have not felt it.  She asked me to examine it.  She feels it is slowly growing.  Insidious onset for a year.   OV 08/13/2017  Chief Complaint  Patient presents with   Follow-up    Pt states she has had many flare-ups with her asthma. States she has had a lot of sinus drainage and congestion and a lot of chest tightness.    85 year old female with complicated asthma    poor control of asthma continues.  At my last visit she did a prednisone taper.  Then a few weeks ago her sinuses acted up and therefore her asthma acted up and she did another prednisone taper on herself at home but it was just for a few days.  She continues on Spiriva, Symbicort, Singulair, Nexium and Xolair injections.  She feels that ever since Dr. Asencion Noble retired her asthma has been out of control.  Currently for the last few weeks her sinuses have been acting up and she wants antibiotics.  In particular she wants Augmentin.  She says in the past this was deemed as an allergy but she went and saw some doctor in [place] and it looks like she has been desensitized.  Penicillin is no longer listed as an allergy.  She insists that she wants Augmentin.  Review of the chart shows she has had chronic sinusitis and a CT scan of the sinus 1 year ago in February 2018.  She says she has seen ENT for this.  No surgery has been recommended.  Just nasal hygiene and antibiotic courses.  Currently her symptoms are quite significant with asthma control question of greater than 2 it appears this might all just be her baseline.  Her exam nitric oxide is still high over 100.  At night she is waking up several times.  When she wakes up she has moderate symptoms.  Activities are slightly limited because of asthma.  She has moderate amount of shortness of breath because of asthma.  She is wheezing hardly and she uses albuterol for rescue at least 1 or 2 times daily.   OV  Chief Complaint  Patient presents with   Follow-up    Pt states her breathing has been much better since she began new injection but states she is coughing more than before. Denies any CP.    Follow-up severe persistent asthma with eosinophilia: In April 2019 we started dupulimab and stop the Xolair. With this asthma control is significantly better. Her asthma control questionnaire average score has drop to 0.6. Her exhaled nitric oxide is  drop from 100s to 43. She says she is much less short of breath and is able to do a lot more things. She is very pleased with the new injectable. However she feels that her baseline chronic cough is slightly worse. It is moderate in intensity is dry. It does not wake up at night. It is not associated with wheezing or shortness of breath. It is associated with Spiriva MDI. She does clear her throat. Coughing is made worse by talking. Off note, she has new issue of left hip surgery coming up in 2 days. She feels from a pulmonary standpoint she will be stable to do the surgery because of prior experience with surgery.        OV 03/08/2018  Subjective:  Patient ID: [name], female , DOB: Mar 15, 1933 , age 67 y.o. , MRN: [id number] , ADDRESS: [address] [place] [address]  03/08/2018 -   Chief Complaint  Patient presents with   Follow-up    Sinus draniage and more cough    HPI Marnee Khawaja 85 y.o. -returns for asthma follow-up.  However the issue today is that she reports worsening of sinus drainage for the last 1 month ever since the fall weather change.  He says the sinus drainage is so significant that it is making her cough.  She feels asthma itself is under control.  In fact exam nitric oxide today is 38.  She is continuing inhalers Singulair Nexium and injection dupilumab.  She refused flu shot because she is allergic to it.  Review of the chart indicates March 2018 Dr. Ashok Cordia did a CT scan of the sinus and it showed pansinusitis.  She is says she is seen ENT from 2 years ago and is unclear if it was before the scan or after the scan.  At this point in time she just wants relief from her current sinus issue.  The sinus drainage is possibly clear but she has not looked at it.  There is no fever worsening wheeze or chest tightness.  05/17/2018 Patient presents today with acute complaints of cough and congestion. Accompanied by husband. Productive cough with clear mucus since  05/05/18. Husband and brother were both sick. She was [facility] on 12/29 and given steroid injection and amoxicillin course. Finished antibiotic 2 days ago. Did not notice much of an improvement, if any. She has taken mucinex and delsym for cough. Hasn't had to use rescue inhaler or nebulizer. Due for Dupixent injection on 05/25/18. Did not receive flu injection d/t allergy. Afebrile.   05/25/2018 Patient presents for 1 week follow-up for asthma exacerbation. CXR showed no acute cardiopulmonary disease. Treated with prednisone taper. Due for dupixent injection today. Feeling better than she did last week, still has a cough but sputum has cleared. Felt better yesterday, rain made her symptoms worse. States anytime the weather changes her asthma flares. Using nebulizer as needed, didn't use it as much yesterday. Required a treatment this morning. Takes Singulair and allegra. Afebrile.       OV 06/08/2018  Subjective:  Patient ID: [name], female , DOB: June 25, 1932 , age 47 y.o. , MRN: [id number] , ADDRESS: [place] [id number]   06/08/2018 -   Chief Complaint  Patient presents with   Follow-up    Pt states she has had sinus problems, cough with clear mucus, and has also had some chest pain/tightness. Pt has been seen by Derl Barrow for asthma exacerbations a couple different times. Pt denies any fever.   Severe asthma on Dupixent.  Also with chronic sinusitis  HPI Ellionna Brueggemann 85 y.o. -returns for follow-up of her issues.  I last saw her in October 2019.  After that earlier this month she came to see nurse practitioner 2 times for acute on chronic sinus issues.  She tells me that she is tolerating her Dupixent injections just fine.  She feels it is helping her.  However, she is bothered by sinus issues this constantly sinus drainage particularly flared up in January 2020.  She recollects seeing Dr. Redmond Baseman in ENT.  At the time we recommended amoxicillin but she was  allergic to it and no antibiotic therefore was prescribed according to history.  Since then she has seen an allergist in her local area and is been cleared to get amoxicillin.  She is okay seeing her ENT but she wants to change to  her husband's ENT surgeon Dr. Elgie Congo.  Early this month with a sinus flush she got antibiotic and prednisone this helped her.  However she still has significant amount of residual symptoms.  Asthma control question is 2.8 but the nitric oxide level is normal.  This was excessive symptom burden.  She is waking up several times at night.  When she wakes up she has moderate symptoms she is moderately limited in her activities.  Moderate amount of shortness of breath and wheezing a little of the time and using albuterol for rescue at least 1 time daily.  She says is unimproved symptomatology because of the recent antibiotic and prednisone.  She feels she needs another course of antibiotic and prednisone as well.  In addition with the cough she is complaining of musculoskeletal pain in the upper chest and the back.  She feels the lungs are hurting.  There is no radiation.  The course is improving.  Of note, her past medical history lists her as having rheumatoid arthritis but she denies that to be the case.  She says she has osteoarthritis.  She does not see a rheumatologist.  She has had no prior immune work-up.    OV 02/28/2019  Subjective:  Patient ID: [name], female , DOB: 1932-05-25 , age 48 y.o. , MRN: [id number] , ADDRESS: [place] [id number]   02/28/2019 -   Chief Complaint  Patient presents with   Follow-up    She reports her breathing has been at her baseline.Allergy to eggs, no flu shot. Reports her dupixent has been great.      HPI Layza Osowski 85 y.o. -  Follow-up chronic severe persistent asthma: She is now on Dupixent, Symbicort, Spiriva and Singulair.  She says her asthma is well under control.  Albuterol rescue use is rare.   No nocturnal awakenings.  She has deferred flu shot because she states "I cannot take it"   Follow-up chronic sinusitis: She has seen ENT Dr. Lorelee Cover and has been prescribed Nasonex.  This is now under control.  New issue of ANA positivity 1: 1280 -in January 2020 because of chronic sinusitis I have ordered autoimmune and vasculitis profile.  Her ANCA profile is negative.  However her ANA is strongly positive.  She admits to having osteoarthritis and osteoporosis.  She denies she has rheumatoid arthritis although this is mentioned in her chart as past medical history.  She denies having seen a rheumatologist.  Review of the chart does not indicate she has had rheumatoid factor tested with an health system.      Asthma Control Panel  10/11/15 - IgE - 425 , rest of blood allergy panel negative. Blood eos 300cells (al;so 300 on 12/19/16). CT Sinus feb 2018 - chronic pan sinusitis     07/02/2016 06/15/2017  08/13/2017  12/01/2017  06/08/2018   Current Med Regimen  Spriva, symbicort,  Singulair, xolair, nexium same Spiriva, Symbicort, Singulair, Nexium and dupulimab Spiriva, Symbicort, Singulair, Nexium and dupulimab  ACQ 5 point- 1 week. wtd avg score. <1.0 is good control 0.75-1.25 is grey zone. >1.25 poor control. Delta 0.5 is clinically meaningful   2.4 0.6 2.9  FeNO ppB  126 ppb 104 ppb 43 19  FeV1  1.777/90%, ratio 74,       Planned intervention  for visit   HRCT, talk to eye doc and if ok start dupixent, augmenting, prednsione,        ROS - per HPI     has  a past medical history of Anemia, Asthma, severe persistent, Cancer ([place]), Chronic kidney disease, stage II (mild), DDD (degenerative disc disease), cervical, Diverticulitis, Diverticulosis (yrs ago), Edema, lower extremity, GERD (gastroesophageal reflux disease), Headache, Heart murmur, History of breast cancer (1990 left mastectomy also), History of DVT of lower extremity (5 yrs ago), History of shingles, Hyperlipidemia, Internal  hemorrhoid, Leg ulcer, left ([place]), Osteoporosis, unspecified, Perennial allergic rhinitis, Peripheral vascular disease ([place]), Pneumonia, Restless legs syndrome (RLS), Rheumatoid arthritis ([place]), Thyroid nodule, and Unspecified essential hypertension.   reports that she has never smoked. She has never used smokeless tobacco.  Past Surgical History:  Procedure Laterality Date   CARDIAC CATHETERIZATION  08/ 01/ 2008   dr Cathie Olden   normal -- EF of 65%   CARDIOVASCULAR STRESS TEST  05-22-2011   dr Cathie Olden   normal lexiscan perfusion study/  no ischemia/  lvsf  86%   CATARACT EXTRACTION W/ INTRAOCULAR LENS  IMPLANT, BILATERAL     CHOLECYSTECTOMY  1993   laparoscopic   CONVERSION TO TOTAL HIP Left 12/03/2017   Procedure: CONVERSION TO LEFT TOTAL HIP;  Surgeon: Paralee Cancel, MD;  Location: [facility];  Service: Orthopedics;  Laterality: Left;  90 mins   HIP ARTHROPLASTY Left 12/22/2016   Procedure: HEMIARTHROPLASTY, LEFT;  Surgeon: Paralee Cancel, MD;  Location: [facility];  Service: Orthopedics;  Laterality: Left;   INCISION AND DRAINAGE OF WOUND Left 10/24/2013   Procedure: IRRIGATION AND DEBRIDEMENT OF LEFT LEG WITH PLACEMENT OF ACELL AND WOUND [place];  Surgeon: Theodoro Kos, DO;  Location: [facility];  Service: Plastics;  Laterality: Left;   [place] Bilateral rigth 1989///   left  1990   breast cancer 1989//   fibrocytic disease 1990   ORIF HUMERUS FRACTURE Right 09/21/2018   Procedure: OPEN REDUCTION INTERNAL FIXATION (ORIF) PROXIMAL HUMERUS FRACTURE;  Surgeon: Nicholes Stairs, MD;  Location: [facility];  Service: Orthopedics;  Laterality: Right;  90 mins   TOTAL ABDOMINAL HYSTERECTOMY W/ BILATERAL SALPINGOOPHORECTOMY  1989   done at same time as right mastectomy   TOTAL KNEE ARTHROPLASTY Right 10/05/2012   Procedure: RIGHT TOTAL KNEE ARTHROPLASTY;  Surgeon: Mauri Pole, MD;  Location: [facility];  Service: Orthopedics;  Laterality: Right;     Allergies  Allergen Reactions   Eggs Or Egg-Derived Products     UNSPECIFIED REACTION  RAW EGGS.. Can eat cooked eggs   Influenza Vaccines     UNSPECIFIED REACTION  Egg allergy    Nucala [Mepolizumab]     UNSPECIFIED REACTION    Cephalosporins Rash   Codeine Rash   Gabapentin Nausea Only   Levofloxacin Rash   Pneumococcal Vaccines Rash    Immunization History  Administered Date(s) Administered   Pneumococcal Conjugate-13 09/14/2012   Pneumococcal Polysaccharide-23 01/31/2009   Tdap 09/07/2018   Zoster Recombinat (Shingrix) 04/18/2017, 09/16/2017    Family History  Problem Relation Age of Onset   Heart attack Mother    Asthma Mother    Heart disease Mother    Hyperlipidemia Mother    Heart attack Father    Heart failure Father    Deep vein thrombosis Father    Heart disease Father    Peripheral vascular disease Father        amputation   Cancer Brother    Asthma Brother    Coronary artery disease Sister    Heart disease Sister      Current Outpatient Medications:  acetaminophen (TYLENOL) 500 MG tablet, Take 2 tablets (1,000 mg total) by mouth every 8 (eight) hours. (Patient taking differently: Take 1,000 mg by mouth every 6 (six) hours as needed (pain). ), Disp: 30 tablet, Rfl: 0   albuterol (PROAIR HFA) 108 (90 Base) MCG/ACT inhaler, Inhale 2 puffs into the lungs every 6 (six) hours as needed for wheezing or shortness of breath., Disp: 18 g, Rfl: 5   albuterol (PROVENTIL) (2.5 MG/3ML) 0.083% nebulizer solution, Take 3 mLs (2.5 mg total) by nebulization every 6 (six) hours as needed for wheezing or shortness of breath. Dx: J45.41, Disp: 360 mL, Rfl: 0   benzonatate (TESSALON) 100 MG capsule, Take 1 capsule (100 mg total) by mouth 3 (three) times daily as needed for cough., Disp: 100 capsule, Rfl: 1   Calcium Carbonate-Vitamin D (CALTRATE 600+D) 600-400 MG-UNIT per tablet, Take 1 tablet by mouth daily. , Disp: , Rfl:     chlorpheniramine (CHLOR-TRIMETON) 4 MG tablet, Take 4-8 mg by mouth See admin instructions. 4 mg in the morning and 8 mg at bedtime , Disp: , Rfl:    Cholecalciferol (VITAMIN D3) 1000 UNITS tablet, Take 1,000 Units by mouth every evening. , Disp: , Rfl:    dextromethorphan (DELSYM) 30 MG/5ML liquid, Take 60 mg by mouth 2 (two) times daily as needed for cough., Disp: , Rfl:    dextromethorphan-guaiFENesin (MUCINEX DM) 30-600 MG 12hr tablet, Take 1 tablet by mouth daily as needed for cough., Disp: , Rfl:    DUPIXENT 300 MG/2ML prefilled syringe, INJECT 300 MG SUBCUTANEOUSLY EVERY OTHER WEEK, Disp: 4 mL, Rfl: 5   fexofenadine (ALLEGRA) 180 MG tablet, Take 180 mg by mouth daily.  , Disp: , Rfl:    furosemide (LASIX) 20 MG tablet, Take 20 mg by mouth daily., Disp: , Rfl:    gabapentin (NEURONTIN) 300 MG capsule, Take 300 mg by mouth daily as needed for pain., Disp: , Rfl:    HYDROcodone-acetaminophen (NORCO/VICODIN) 5-325 MG tablet, Take 1-2 tablets by mouth every 4 (four) hours as needed for moderate pain (pain score 4-6)., Disp: 45 tablet, Rfl: 0   ibandronate (BONIVA) 150 MG tablet, Take 1 tablet by mouth every 30 (thirty) days. , Disp: , Rfl: 3   mometasone (NASONEX) 50 MCG/ACT nasal spray, Place 2 sprays into the nose daily. (Patient taking differently: Place 2 sprays into the nose 2 (two) times a day. ), Disp: 51 g, Rfl: 11   montelukast (SINGULAIR) 10 MG tablet, TAKE 1 TABLET BY MOUTH EVERY NIGHT AT BEDTIME, Disp: 90 tablet, Rfl: 1   Multiple Vitamin (MULTIVITAMIN WITH MINERALS) TABS tablet, Take 1 tablet by mouth daily. Centrum Silver Multivitamin, Disp: , Rfl:    Multiple Vitamins-Minerals (PRESERVISION AREDS PO), Take 1 tablet by mouth 2 (two) times daily., Disp: , Rfl:    NEXIUM 40 MG capsule, TAKE 1 CAPSULE (40 MG TOTAL) BY MOUTH 2 (TWO) TIMES DAILY. NAME BRAND ONLY (Patient taking differently: Take 40 mg by mouth 2 (two) times daily. ), Disp: 180 capsule, Rfl: 3   potassium  chloride (K-DUR) 10 MEQ tablet, TAKE 1 TABLET BY MOUTH EVERY DAY (Patient taking differently: Take 10 mEq by mouth daily. ), Disp: 90 tablet, Rfl: 3   pramipexole (MIRAPEX) 0.125 MG tablet, Take 0.375 mg by mouth at bedtime. , Disp: , Rfl:    rosuvastatin (CRESTOR) 5 MG tablet, TAKE 1 TABLET BY MOUTH EVERYDAY AT BEDTIME (Patient taking differently: Take 5 mg by mouth at bedtime. ), Disp: 90 tablet, Rfl: 3  sennosides-docusate sodium (SENOKOT-S) 8.6-50 MG tablet, Take 1 tablet by mouth daily as needed for constipation., Disp: , Rfl:    Spacer/Aero-Holding Chambers (AEROCHAMBER MV) inhaler, by Other route. Use as instructed , Disp: , Rfl:    SYMBICORT 160-4.5 MCG/ACT inhaler, TAKE 2 PUFFS BY MOUTH TWICE A DAY (Patient taking differently: Inhale 2 puffs into the lungs 2 (two) times a day. ), Disp: 30.6 Inhaler, Rfl: 1   telmisartan (MICARDIS) 20 MG tablet, Take 20 mg by mouth daily. , Disp: , Rfl:    Tiotropium Bromide Monohydrate (SPIRIVA RESPIMAT) 2.5 MCG/ACT AERS, Inhale 2 puffs into the lungs daily., Disp: 4 g, Rfl: 0   vitamin B-12 (CYANOCOBALAMIN) 500 MCG tablet, Take 500 mcg by mouth daily.  , Disp: , Rfl:    XARELTO 20 MG TABS tablet, Take 20 mg by mouth daily with supper. , Disp: , Rfl:   Current Facility-Administered Medications:    omalizumab Arvid Right) injection 300 mg, 300 mg, Subcutaneous, Q14 Days, Mckena Chern, MD, 300 mg at 07/27/17 1013   omalizumab Arvid Right) injection 300 mg, 300 mg, Subcutaneous, Q14 Days, Santosh Petter, MD, 300 mg at 08/13/17 0815   omalizumab Arvid Right) injection 300 mg, 300 mg, Subcutaneous, Q14 Days, Shantera Monts, Belva Crome, MD, 300 mg at 08/27/17 0950      Objective:   Vitals:   02/28/19 0939  BP: 128/74  Pulse: (!) 109  Temp: (!) 97.1 F (36.2 C)  TempSrc: Temporal  SpO2: 98%  Weight: 142 lb (64.4 kg)  Height: 5\' 4"  (1.626 m)    Estimated body mass index is 24.37 kg/m as calculated from the following:   Height as of this encounter:  5\' 4"  (1.626 m).   Weight as of this encounter: 142 lb (64.4 kg).  @WEIGHTCHANGE @  [employer]   02/28/19 0939  Weight: 142 lb (64.4 kg)     Physical Exam  General Appearance:    Alert, cooperative, no distress, appears stated age - yes , Deconditioned looking - no , OBESE  - no, Sitting on Wheelchair -  no  Head:    Normocephalic, without obvious abnormality, atraumatic  Eyes:    PERRL, conjunctiva/corneas clear,  Ears:    Normal TM's and external ear canals, both ears  Nose:   Nares normal, septum midline, mucosa normal, no drainage    or sinus tenderness. OXYGEN ON  - no . Patient is @ ra   Throat:   Lips, mucosa, and tongue normal; teeth and gums normal. Cyanosis on lips - no  Neck:   Supple, symmetrical, trachea midline, no adenopathy;    thyroid:  no enlargement/tenderness/nodules; no carotid   bruit or JVD  Back:     Symmetric, no curvature, ROM normal, no CVA tenderness  Lungs:     Distress - no , Wheeze no, Barrell Chest - no, Purse lip breathing - no, Crackles - no   Chest Wall:    No tenderness or deformity.    Heart:    Regular rate and rhythm, S1 and S2 normal, no rub   or gallop, Murmur - no  Breast Exam:    NOT DONE  Abdomen:     Soft, non-tender, bowel sounds active all four quadrants,    no masses, no organomegaly. Visceral obesity - no  Genitalia:   NOT DONE  Rectal:   NOT DONE  Extremities:   Extremities - normal, Has Cane - no, Clubbing - no, Edema - no  Pulses:   2+ and symmetric all extremities  Skin:  Stigmata of Connective Tissue Disease - NO  Lymph nodes:   Cervical, supraclavicular, and axillary nodes normal  Psychiatric:  Neurologic:   Pleasant - yes, Anxious - no, Flat affect - no illicit came from triage  CAm-ICU - neg, Alert and Oriented x 3 - yes, Moves all 4s - yes, Speech - normal, Cognition - intact           Assessment:       ICD-10-CM   1. Positive ANA (antinuclear antibody)  [id number] Cyclic citrul peptide antibody, IgG     Rheumatoid Factor    Anti-Scleroderma Antibody    Sjogrens syndrome-A extractable nuclear antibody    Sjogrens syndrome-B extractable nuclear antibody  2. Severe persistent asthma without complication  [id number]   3. Chronic panethmoidal sinusitis  J32.2        Plan:     Patient Instructions  Severe persistent asthma without complication  -under control  Plan  - continue dupixent, symbicort, spiriva and singulair - respect flu shot deferral  - at followup can asses reducing some of your scheduled medicines such as singulair and spiria  Chronic panethmoidal sinusitis -contnue advise per Dr Elgie Congo and nasonex   ANA positivity but ANCA negative  - check RF, CCP, Scl-70, SSA, SSB  - will send note to PCP Crist Infante, MD  - you might need referral to a rheumatologist based on above results  Followup  6 months  or sooner if needed; ACQ and FeNO if posible       SIGNATURE    Dr. Brand Males, M.D., F.C.C.P,  Pulmonary and Critical Care Medicine Staff Physician, [facility] [facility] Director - Interstitial Lung Disease  Program  [facility] [place] [place], [place], [address]  Pager: [phone], If no answer or between  15:00h - 7:00h: call [phone] Telephone: [phone]  10:14 AM 02/28/2019

## 2019-02-28 NOTE — Patient Instructions (Addendum)
Severe persistent asthma without complication  -under control  Plan  - continue dupixent, symbicort, spiriva and singulair - respect flu shot deferral  - at followup can asses reducing some of your scheduled medicines such as singulair and spiria  Chronic panethmoidal sinusitis -contnue advise per Dr Elgie Congo and nasonex   ANA positivity but ANCA negative  - check RF, CCP, Scl-70, SSA, SSB  - will send note to PCP Crist Infante, MD  - you might need referral to a rheumatologist based on above results  Followup  6 months  or sooner if needed; ACQ and FeNO if posible

## 2019-03-01 ENCOUNTER — Telehealth: Payer: Self-pay | Admitting: Internal Medicine

## 2019-03-01 ENCOUNTER — Ambulatory Visit: Payer: Medicare Other | Admitting: Internal Medicine

## 2019-03-01 LAB — ANTI-SCLERODERMA ANTIBODY: Scleroderma (Scl-70) (ENA) Antibody, IgG: <1 AI

## 2019-03-01 LAB — SJOGRENS SYNDROME-B EXTRACTABLE NUCLEAR ANTIBODY: SSB (La) (ENA) Antibody, IgG: <1 AI

## 2019-03-01 LAB — CYCLIC CITRUL PEPTIDE ANTIBODY, IGG: Cyclic Citrullin Peptide Ab: <16 UNITS

## 2019-03-01 LAB — SJOGRENS SYNDROME-A EXTRACTABLE NUCLEAR ANTIBODY: SSA (Ro) (ENA) Antibody, IgG: >8 AI — AB

## 2019-03-01 LAB — RHEUMATOID FACTOR: Rheumatoid fact SerPl-aCnc: <14 IU/mL (ref <14)

## 2019-03-01 NOTE — Telephone Encounter (Signed)
Called pt and advised message from the provider. Pt understood and verbalized understanding. Nothing further is needed.   Pt wanted to hold off on referral until she met with Dr. Joylene Draft on Friday. I faxed the lab results to him so he can review with her at her appointment.

## 2019-03-01 NOTE — Telephone Encounter (Signed)
  LEt [name] know that antibiody SSA is positive and so is ANA - I think she might have a condition called Sjogren's associated with dryness of the mouth and the eyes.  Plan -Refer her to rheumatology first available nonurgent work-up      SIGNATURE    Dr. Brand Males, M.D., F.C.C.P,  Pulmonary and Critical Care Medicine Staff Physician, [facility] Director - Interstitial Lung Disease  Program  [facility] [place], [place], [address]  Pager: [phone], If no answer or between  15:00h - 7:00h: call [phone] Telephone: [phone]  2:34 PM 03/01/2019       Results for [name], [name] (MRN [id number]) as of 03/01/2019 14:30  Ref. Range [id number]  Cyclic Citrullin Peptide Ab Latest Units: UNITS <16  RA Latex Turbid. Latest Ref Range: <14 IU/mL <14  SSA (Ro) (ENA) Antibody, IgG Latest Ref Range: <1.0 NEG AI >8.0 POS (A)  SSB (La) (ENA) Antibody, IgG Latest Ref Range: <1.0 NEG AI <1.0 NEG  Scleroderma (Scl-70) (ENA) Antibody, IgG Latest Ref Range: <1.0 NEG AI <1.0 NEG  Results for [name], [name] (MRN [id number]) as of 03/01/2019 14:30  Ref. Range 06/08/2018 10:22  Anti Nuclear Antibody (ANA) Latest Ref Range: NEGATIVE  POSITIVE (A)  ANA Pattern 1 Unknown Cytoplasmic (A)  ANA Titer 1 Latest Units: titer > OR = 1:1280 (A)  Myeloperoxidase Abs Latest Units: AI <1.0  Serine Protease 3 Latest Units: AI <1.0

## 2019-03-02 ENCOUNTER — Other Ambulatory Visit: Payer: Self-pay

## 2019-03-02 ENCOUNTER — Ambulatory Visit (INDEPENDENT_AMBULATORY_CARE_PROVIDER_SITE_OTHER): Payer: Medicare Other

## 2019-03-02 DIAGNOSIS — J4550 Severe persistent asthma, uncomplicated: Secondary | ICD-10-CM

## 2019-03-02 MED ORDER — DUPILUMAB 300 MG/2ML ~~LOC~~ SOSY
300.0000 mg | PREFILLED_SYRINGE | Freq: Once | SUBCUTANEOUS | Status: AC
  Administered 2019-03-02: 300 mg via SUBCUTANEOUS

## 2019-03-02 NOTE — Progress Notes (Signed)
All questions were answered by the patient before medication was administered. Have you been hospitalized in the last 10 days? No Do you have a fever? No Do you have a cough? No Do you have a headache or sore throat? No  

## 2019-03-04 DIAGNOSIS — J45901 Unspecified asthma with (acute) exacerbation: Secondary | ICD-10-CM | POA: Diagnosis not present

## 2019-03-04 DIAGNOSIS — I2510 Atherosclerotic heart disease of native coronary artery without angina pectoris: Secondary | ICD-10-CM | POA: Diagnosis not present

## 2019-03-04 DIAGNOSIS — I129 Hypertensive chronic kidney disease with stage 1 through stage 4 chronic kidney disease, or unspecified chronic kidney disease: Secondary | ICD-10-CM | POA: Diagnosis not present

## 2019-03-04 DIAGNOSIS — Z1331 Encounter for screening for depression: Secondary | ICD-10-CM | POA: Diagnosis not present

## 2019-03-04 DIAGNOSIS — M25552 Pain in left hip: Secondary | ICD-10-CM | POA: Diagnosis not present

## 2019-03-04 DIAGNOSIS — Z0000 Encounter for general adult medical examination without abnormal findings: Secondary | ICD-10-CM | POA: Diagnosis not present

## 2019-03-04 DIAGNOSIS — I700 Atherosclerosis of aorta: Secondary | ICD-10-CM | POA: Diagnosis not present

## 2019-03-04 DIAGNOSIS — I8310 Varicose veins of unspecified lower extremity with inflammation: Secondary | ICD-10-CM | POA: Diagnosis not present

## 2019-03-04 DIAGNOSIS — I10 Essential (primary) hypertension: Secondary | ICD-10-CM | POA: Diagnosis not present

## 2019-03-04 DIAGNOSIS — D692 Other nonthrombocytopenic purpura: Secondary | ICD-10-CM | POA: Diagnosis not present

## 2019-03-04 DIAGNOSIS — C50919 Malignant neoplasm of unspecified site of unspecified female breast: Secondary | ICD-10-CM | POA: Diagnosis not present

## 2019-03-04 DIAGNOSIS — R82998 Other abnormal findings in urine: Secondary | ICD-10-CM | POA: Diagnosis not present

## 2019-03-04 DIAGNOSIS — N1831 Chronic kidney disease, stage 3a: Secondary | ICD-10-CM | POA: Diagnosis not present

## 2019-03-04 DIAGNOSIS — M810 Age-related osteoporosis without current pathological fracture: Secondary | ICD-10-CM | POA: Diagnosis not present

## 2019-03-14 ENCOUNTER — Telehealth: Payer: Self-pay | Admitting: Internal Medicine

## 2019-03-14 DIAGNOSIS — R768 Other specified abnormal immunological findings in serum: Secondary | ICD-10-CM

## 2019-03-14 NOTE — Telephone Encounter (Signed)
Call returned to patient, she is requesting to make an injection appt. She went to [place] and returned over the weekend. She states everyone wore masks while they were there. She denies that she is having any symptoms. Denies fever N/V/D. She also states when she saw MR last she told him she wanted to wait to get a rheumatology referral. I made her aware I can place the referral. Referral placed. Nothing further needed at this time.

## 2019-03-16 ENCOUNTER — Ambulatory Visit (INDEPENDENT_AMBULATORY_CARE_PROVIDER_SITE_OTHER): Payer: Medicare Other

## 2019-03-16 ENCOUNTER — Other Ambulatory Visit: Payer: Self-pay

## 2019-03-16 ENCOUNTER — Ambulatory Visit: Payer: Medicare Other

## 2019-03-16 DIAGNOSIS — J4550 Severe persistent asthma, uncomplicated: Secondary | ICD-10-CM | POA: Diagnosis not present

## 2019-03-16 MED ORDER — DUPILUMAB 300 MG/2ML ~~LOC~~ SOSY
300.0000 mg | PREFILLED_SYRINGE | Freq: Once | SUBCUTANEOUS | Status: AC
  Administered 2019-03-16: 15:00:00 300 mg via SUBCUTANEOUS

## 2019-03-16 NOTE — Progress Notes (Signed)
All questions were answered by the patient before medication was administered. Have you been hospitalized in the last 10 days? No Do you have a fever? No Do you have a cough? No Do you have a headache or sore throat? No  

## 2019-03-18 ENCOUNTER — Other Ambulatory Visit: Payer: Self-pay | Admitting: Internal Medicine

## 2019-03-24 ENCOUNTER — Telehealth: Payer: Self-pay | Admitting: Internal Medicine

## 2019-03-24 NOTE — Telephone Encounter (Signed)
Dupixent Shipment Received: 300mg  #2 prefilled syringe Medication arrival date: 03/24/2019 Lot #: [id number] Exp date: 08/2021 Received by: Desmond Dike, CMA

## 2019-03-30 ENCOUNTER — Ambulatory Visit (INDEPENDENT_AMBULATORY_CARE_PROVIDER_SITE_OTHER): Payer: Medicare Other

## 2019-03-30 ENCOUNTER — Other Ambulatory Visit: Payer: Self-pay

## 2019-03-30 DIAGNOSIS — J4550 Severe persistent asthma, uncomplicated: Secondary | ICD-10-CM | POA: Diagnosis not present

## 2019-03-30 MED ORDER — DUPILUMAB 300 MG/2ML ~~LOC~~ SOSY
300.0000 mg | PREFILLED_SYRINGE | Freq: Once | SUBCUTANEOUS | Status: AC
  Administered 2019-03-30: 300 mg via SUBCUTANEOUS

## 2019-03-30 NOTE — Progress Notes (Signed)
Have you been hospitalized within the last 10 days?  No Do you have a fever?  No Do you have a cough?  No Do you have a headache or sore throat? No Do you have your Epi Pen visible and is it within date?  Yes 

## 2019-04-13 ENCOUNTER — Other Ambulatory Visit: Payer: Self-pay

## 2019-04-13 ENCOUNTER — Ambulatory Visit (INDEPENDENT_AMBULATORY_CARE_PROVIDER_SITE_OTHER): Payer: Medicare Other

## 2019-04-13 DIAGNOSIS — J4550 Severe persistent asthma, uncomplicated: Secondary | ICD-10-CM

## 2019-04-13 MED ORDER — DUPILUMAB 300 MG/2ML ~~LOC~~ SOSY
300.0000 mg | PREFILLED_SYRINGE | Freq: Once | SUBCUTANEOUS | Status: AC
  Administered 2019-04-13: 10:00:00 300 mg via SUBCUTANEOUS

## 2019-04-13 NOTE — Progress Notes (Signed)
All questions were answered by the patient before medication was administered. Have you been hospitalized in the last 10 days? No Do you have a fever? No Do you have a cough? No Do you have a headache or sore throat? No  

## 2019-04-20 ENCOUNTER — Telehealth: Payer: Self-pay | Admitting: Internal Medicine

## 2019-04-20 NOTE — Telephone Encounter (Signed)
Received call from [facility] to set up delivery.  Dupixent Order: 300mg  #2 prefilled syringe Ordered Date: 04/20/19 Expected date of arrival: 04/22/19 Ordered by: [place]: [facility]

## 2019-04-20 NOTE — Progress Notes (Addendum)
Virtual Visit via Telephone Note  I connected with [name] on 04/22/19 at  8:45 AM EST by telephone and verified that I am speaking with the correct person using two identifiers.  Location: Patient: Home  Provider: Clinic  This service was conducted via virtual visit.   The patient was located at home. I was located in my office.  Consent was obtained prior to the virtual visit and is aware of possible charges through their insurance for this visit.  The patient is an established patient.  Dr. Estanislado Pandy, MD conducted the virtual visit and Hazel Sams, PA-C acted as scribe during the service.  Office staff helped with scheduling follow up visits after the service was conducted.     I discussed the limitations, risks, security and privacy concerns of performing an evaluation and management service by telephone and the availability of in person appointments. I also discussed with the patient that there may be a patient responsible charge related to this service. The patient expressed understanding and agreed to proceed.  CC: History of Present Illness: Patient is a 85 year old female with history of asthma and congestive heart failure.  She has been seen in consultation per request of Dr. Chase Caller for positive ANA.  According to patient she has had pain in multiple joints due to previous injuries and fractures.  She denies any joint swelling.  She also gives history of dry eyes which are very well controlled with eyedrops.  She denies any dry mouth.  She states her asthma is very well controlled now.  There is no history of oral ulcers, nasal ulcers, malar rash, Raynaud's, photosensitivity.  Review of Systems  Constitutional: Positive for malaise/fatigue. Negative for fever.  Eyes: Negative for photophobia, pain, discharge and redness.       +Dry eyes  Respiratory: Positive for shortness of breath. Negative for cough and wheezing.   Cardiovascular: Negative for chest pain and palpitations.   Gastrointestinal: Negative for blood in stool, constipation and diarrhea.  Genitourinary: Negative for dysuria.  Musculoskeletal: Positive for joint pain. Negative for back pain, myalgias and neck pain.  Skin: Negative for rash.       Denies any photosensitivity   Neurological: Negative for dizziness and headaches.  Psychiatric/Behavioral: Negative for depression. The patient is not nervous/anxious and does not have insomnia.       Observations/Objective: Physical Exam  Constitutional: She is oriented to person, place, and time.  Neurological: She is alert and oriented to person, place, and time.  Psychiatric: Mood, memory, affect and judgment normal.   Patient reports morning stiffness for 30  minutes.   Patient reports nocturnal pain.  Difficulty dressing/grooming: Reports Difficulty climbing stairs: Reports Difficulty getting out of chair: Reports Difficulty using hands for taps, buttons, cutlery, and/or writing: Reports  June 08, 2018 ANA 1: 1280 cytoplasmic, February 28, 2019 SSA positive, SSB negative, SCL 70 -, RF negative, anti-CCP negative  Assessment and Plan: Diagnoses and all orders for this visit:  Positive ANA (antinuclear antibody) Positive anti-Ro antibody- Patient has mild dry eye symptoms which are very well controlled with eyedrops.  She denies any history of dry mouth.  She has no history of oral ulcers, nasal ulcers, malar rash, photosensitivity, Raynaud's, joint swelling.  At this point I do not see any need for immunosuppressive agents.  I would like to repeat some of the lab work at the follow-up visit.  She possibly have Sjogren's.  No additional treatment will be needed at this point.  I  plan to see patient back in about 3 to 4 months and repeat lab work.  I will examine at the time if she has any synovitis.  She denies any joint swelling. Comments: 06/08/18: ANA 1:1280 cytoplasmic, ESR 87, C-ANCA 1:20, myeloperoxidase- 02/28/19: Ro >8, La-, Scl-70-,  RF<14, anti-CCP<16  Humerus, initial encounter for closed fracture- Patient states that she had surgery in May 2020 and has been going to physical therapy.  She still has limited range of motion of her right shoulder joint.  She has chronic pain in her joints due to multiple injuries.  S/P left TH revision-she has chronic discomfort.  Status post right total knee replacement-chronic pain  DDD lumbar-patient states she had surgery on her lumbar spine and she suffers from chronic pain.  Other medical problems are listed as follows:  History of DVT of lower extremity  Lung nodule  Stage 3 chronic kidney disease, unspecified whether stage 3a or 3b CKD  Severe persistent asthma without complication-very well controlled per patient.  Essential hypertension  Chronic diastolic CHF (congestive heart failure) (HCC)  Coronary artery disease involving native coronary artery of native heart without angina pectoris  Hypertensive heart disease without CHF  History of hyperlipidemia  History of gastroesophageal reflux (GERD)     Follow Up Instructions: She will follow up in April 2021.   I discussed the assessment and treatment plan with the patient. The patient was provided an opportunity to ask questions and all were answered. The patient agreed with the plan and demonstrated an understanding of the instructions.   The patient was advised to call back or seek an in-person evaluation if the symptoms worsen or if the condition fails to improve as anticipated.  I provided 30 minutes of non-face-to-face time during this encounter.   Bo Merino, MD

## 2019-04-22 ENCOUNTER — Other Ambulatory Visit: Payer: Self-pay

## 2019-04-22 ENCOUNTER — Telehealth (INDEPENDENT_AMBULATORY_CARE_PROVIDER_SITE_OTHER): Payer: Medicare Other | Admitting: Rheumatology

## 2019-04-22 ENCOUNTER — Encounter: Payer: Self-pay | Admitting: Rheumatology

## 2019-04-22 DIAGNOSIS — I5032 Chronic diastolic (congestive) heart failure: Secondary | ICD-10-CM

## 2019-04-22 DIAGNOSIS — I10 Essential (primary) hypertension: Secondary | ICD-10-CM

## 2019-04-22 DIAGNOSIS — N183 Chronic kidney disease, stage 3 unspecified: Secondary | ICD-10-CM

## 2019-04-22 DIAGNOSIS — R768 Other specified abnormal immunological findings in serum: Secondary | ICD-10-CM | POA: Diagnosis not present

## 2019-04-22 DIAGNOSIS — I2510 Atherosclerotic heart disease of native coronary artery without angina pectoris: Secondary | ICD-10-CM

## 2019-04-22 DIAGNOSIS — S42251A Displaced fracture of greater tuberosity of right humerus, initial encounter for closed fracture: Secondary | ICD-10-CM

## 2019-04-22 DIAGNOSIS — J4550 Severe persistent asthma, uncomplicated: Secondary | ICD-10-CM

## 2019-04-22 DIAGNOSIS — Z8639 Personal history of other endocrine, nutritional and metabolic disease: Secondary | ICD-10-CM

## 2019-04-22 DIAGNOSIS — Z96649 Presence of unspecified artificial hip joint: Secondary | ICD-10-CM

## 2019-04-22 DIAGNOSIS — R911 Solitary pulmonary nodule: Secondary | ICD-10-CM

## 2019-04-22 DIAGNOSIS — Z8719 Personal history of other diseases of the digestive system: Secondary | ICD-10-CM

## 2019-04-22 DIAGNOSIS — I119 Hypertensive heart disease without heart failure: Secondary | ICD-10-CM

## 2019-04-22 DIAGNOSIS — R7689 Other specified abnormal immunological findings in serum: Secondary | ICD-10-CM

## 2019-04-22 DIAGNOSIS — Z86718 Personal history of other venous thrombosis and embolism: Secondary | ICD-10-CM

## 2019-04-22 NOTE — Telephone Encounter (Signed)
Dupixent Shipment Received: 300mg  #2 prefilled syringe Medication arrival date: 04/22/19 Lot #: [id number] Exp date: 06/12/2021 Received by: Elliot Dally

## 2019-04-27 ENCOUNTER — Ambulatory Visit (INDEPENDENT_AMBULATORY_CARE_PROVIDER_SITE_OTHER): Payer: Medicare Other

## 2019-04-27 ENCOUNTER — Other Ambulatory Visit: Payer: Self-pay

## 2019-04-27 DIAGNOSIS — J4550 Severe persistent asthma, uncomplicated: Secondary | ICD-10-CM | POA: Diagnosis not present

## 2019-04-27 MED ORDER — DUPILUMAB 300 MG/2ML ~~LOC~~ SOSY
300.0000 mg | PREFILLED_SYRINGE | Freq: Once | SUBCUTANEOUS | Status: AC
  Administered 2019-04-27: 09:00:00 300 mg via SUBCUTANEOUS

## 2019-04-27 NOTE — Progress Notes (Signed)
All questions were answered by the patient before medication was administered. Have you been hospitalized in the last 10 days? No Do you have a fever? No Do you have a cough? No Do you have a headache or sore throat? No  

## 2019-05-03 ENCOUNTER — Telehealth: Payer: Self-pay | Admitting: Rheumatology

## 2019-05-03 NOTE — Telephone Encounter (Signed)
-----   Message from Shona Needles, RT sent at 04/22/2019 12:53 PM EST ----- Regarding: NP F/U APRIL 2021/NEEDS LABS RECHECKED

## 2019-05-03 NOTE — Telephone Encounter (Signed)
[facility] for patient to call and schedule NPT FU appointment due in April 2021 / Needs labs rechecked.

## 2019-05-04 ENCOUNTER — Telehealth: Payer: Self-pay | Admitting: Internal Medicine

## 2019-05-04 MED ORDER — NEXIUM 40 MG PO CPDR: 40.0000 mg | DELAYED_RELEASE_CAPSULE | Freq: Two times a day (BID) | ORAL | 1 refills | Status: DC

## 2019-05-04 NOTE — Telephone Encounter (Signed)
Called and spoke with pt. Stated to pt that I will be refilling her med for her and she verbalized understanding. Verified pt's preferred pharmacy and sent Rx to pharmacy. Nothing further needed.

## 2019-05-10 ENCOUNTER — Encounter: Payer: Self-pay | Admitting: Internal Medicine

## 2019-05-10 ENCOUNTER — Telehealth: Payer: Self-pay | Admitting: Internal Medicine

## 2019-05-10 NOTE — Telephone Encounter (Signed)
We received a letter dated 04/16/2019 from the pt's insurance stating her coverage for Dupixent has been denied. Per our documentation we have not done a recent PA for the pt's Dupixent. I have created an appeal letter and this along with clincial documentation has been faxed to the pt's insurance company at [phone]. Will await decision.

## 2019-05-11 ENCOUNTER — Ambulatory Visit (INDEPENDENT_AMBULATORY_CARE_PROVIDER_SITE_OTHER): Payer: Medicare Other

## 2019-05-11 ENCOUNTER — Other Ambulatory Visit: Payer: Self-pay

## 2019-05-11 DIAGNOSIS — J4550 Severe persistent asthma, uncomplicated: Secondary | ICD-10-CM | POA: Diagnosis not present

## 2019-05-11 MED ORDER — DUPILUMAB 300 MG/2ML ~~LOC~~ SOSY
300.0000 mg | PREFILLED_SYRINGE | Freq: Once | SUBCUTANEOUS | Status: AC
  Administered 2019-05-11: 10:00:00 300 mg via SUBCUTANEOUS

## 2019-05-11 NOTE — Telephone Encounter (Signed)
Gregary Signs from [place] calling for delivery.  See previous note.  [phone]

## 2019-05-11 NOTE — Telephone Encounter (Signed)
Dupixent Order: 300mg  #2 prefilled syringe Ordered Date: 05/11/19 Expected date of arrival: 05/20/2019 Ordered by: [place]: [facility]

## 2019-05-11 NOTE — Progress Notes (Signed)
Have you been hospitalized within the last 10 days?  No Do you have a fever?  No Do you have a cough?  No Do you have a headache or sore throat? No  

## 2019-05-14 ENCOUNTER — Other Ambulatory Visit: Payer: Self-pay | Admitting: Cardiovascular Disease

## 2019-05-16 ENCOUNTER — Telehealth: Payer: Self-pay | Admitting: Internal Medicine

## 2019-05-16 NOTE — Telephone Encounter (Signed)
Spoke with pt. Advised her that we are currently working on the appeal for her [place]. Please see other telephone encounter for follow up.

## 2019-05-17 NOTE — Telephone Encounter (Signed)
Appeal has been approved 05/13/2019-05/12/2020.

## 2019-05-18 NOTE — Telephone Encounter (Signed)
Spoke with pt. She is aware that her appeal has been approved. Will await shipment.

## 2019-05-23 NOTE — Telephone Encounter (Signed)
Dupixent Shipment Received: 300mg  #2 prefilled syringe Medication arrival date: 05/23/2019 Lot #: [id number] Exp date: 09/09/2021 Received by: Elliot Dally

## 2019-05-24 ENCOUNTER — Other Ambulatory Visit: Payer: Self-pay | Admitting: Cardiovascular Disease

## 2019-05-25 ENCOUNTER — Other Ambulatory Visit: Payer: Self-pay

## 2019-05-25 ENCOUNTER — Ambulatory Visit (INDEPENDENT_AMBULATORY_CARE_PROVIDER_SITE_OTHER): Payer: Medicare Other

## 2019-05-25 DIAGNOSIS — J4550 Severe persistent asthma, uncomplicated: Secondary | ICD-10-CM

## 2019-05-25 MED ORDER — DUPILUMAB 300 MG/2ML ~~LOC~~ SOSY
300.0000 mg | PREFILLED_SYRINGE | Freq: Once | SUBCUTANEOUS | Status: AC
  Administered 2019-05-25: 10:00:00 300 mg via SUBCUTANEOUS

## 2019-05-25 NOTE — Progress Notes (Signed)
Have you been hospitalized within the last 10 days?  No Do you have a fever?  No Do you have a cough?  No Do you have a headache or sore throat? No Do you have your Epi Pen visible and is it within date?  Yes 

## 2019-05-26 ENCOUNTER — Other Ambulatory Visit: Payer: Self-pay | Admitting: Internal Medicine

## 2019-05-31 ENCOUNTER — Other Ambulatory Visit: Payer: Self-pay | Admitting: Internal Medicine

## 2019-06-02 ENCOUNTER — Ambulatory Visit: Payer: Medicare Other | Admitting: Rheumatology

## 2019-06-08 ENCOUNTER — Other Ambulatory Visit: Payer: Self-pay

## 2019-06-08 ENCOUNTER — Ambulatory Visit: Payer: Medicare Other

## 2019-06-08 ENCOUNTER — Ambulatory Visit (INDEPENDENT_AMBULATORY_CARE_PROVIDER_SITE_OTHER): Payer: Medicare Other

## 2019-06-08 DIAGNOSIS — J4550 Severe persistent asthma, uncomplicated: Secondary | ICD-10-CM | POA: Diagnosis not present

## 2019-06-08 MED ORDER — DUPILUMAB 300 MG/2ML ~~LOC~~ SOSY
300.0000 mg | PREFILLED_SYRINGE | Freq: Once | SUBCUTANEOUS | Status: AC
  Administered 2019-06-08: 300 mg via SUBCUTANEOUS

## 2019-06-08 NOTE — Progress Notes (Signed)
All questions were answered by the patient before medication was administered. Have you been hospitalized in the last 10 days? No Do you have a fever? No Do you have a cough? No Do you have a headache or sore throat? No  

## 2019-06-09 ENCOUNTER — Other Ambulatory Visit: Payer: Self-pay | Admitting: Internal Medicine

## 2019-06-09 NOTE — Telephone Encounter (Signed)
Please advise 

## 2019-06-10 ENCOUNTER — Telehealth: Payer: Self-pay | Admitting: Internal Medicine

## 2019-06-10 NOTE — Telephone Encounter (Signed)
Attempted to call [facility] to set up pt's next shipment. Was on hold for over 15 minutes with no one coming to the line.

## 2019-06-15 NOTE — Telephone Encounter (Signed)
Called [facility] to set up [place] delivery.  Delivery is scheduled for today, 06/15/19.

## 2019-06-15 NOTE — Telephone Encounter (Signed)
Dupixent Shipment Received: 300mg  #2 prefilled syringe Medication arrival date: 06/15/19 Lot #: [id number] Exp date: 09/09/2021 Received by: Elliot Dally

## 2019-06-20 ENCOUNTER — Other Ambulatory Visit: Payer: Self-pay

## 2019-06-20 ENCOUNTER — Encounter: Payer: Self-pay | Admitting: Cardiovascular Disease

## 2019-06-20 ENCOUNTER — Ambulatory Visit (INDEPENDENT_AMBULATORY_CARE_PROVIDER_SITE_OTHER): Payer: Medicare Other | Admitting: Cardiovascular Disease

## 2019-06-20 VITALS — BP 146/70 | HR 80 | Ht 65.0 in | Wt 141.0 lb

## 2019-06-20 DIAGNOSIS — I119 Hypertensive heart disease without heart failure: Secondary | ICD-10-CM

## 2019-06-20 DIAGNOSIS — I10 Essential (primary) hypertension: Secondary | ICD-10-CM | POA: Diagnosis not present

## 2019-06-20 DIAGNOSIS — I5032 Chronic diastolic (congestive) heart failure: Secondary | ICD-10-CM

## 2019-06-20 NOTE — Progress Notes (Signed)
[name] Date of Birth  1932/12/22       [facility]    [employer] [address], [address], [place], [place]  [address]   [place], [place]  [address] [phone]     [phone]   Fax  [phone]    Fax [phone]  Problem List: 1. History of chest pain-normal coronary arteries by heart catheterization in August, 2008 2. History of breast cancer-status post right mastectomy and 1989-status post left mastectomy in 1990 3. Hypertension 4. Hyperlipidemia 5. Asthma 6. Chronic diastolic CHF      [name] has had lots of problems with her asthma this summer.  She's not had any cardiac complaints. She denies any chest pain. She's not been able to exercise much of the summer because her asthma problems.  Oct. 23, 2014:  She has had a total knee replacement since I saw her.   She is back exercising some - rehab for her knee.  Has lost a few lbs.   Oct. 27, 2015:  [name] is doing ok from a cardiac standpoint. She's had a wound on the left lower leg which she's been tending to.  She has been going to the wound Center.   [name] history of asthma and has chronic shortness of breath. No angina like pain.    Oct. 27, 2016  Has had lots of breathing issues.  Has had a rough year with her lungs.  Recently had PFTs and was found to her 47% predicted capacity ( on one of the measured functions )  Wakes up with CP on occasion .   Also has some exertional CP .   Sep 12, 2015:  [name] is doing ok from a cardiac standpoint.  Had reported some  chest pain  myoview revealed normal left ventricular systolic function and no ischemia. The echocardiogram revealed normal left ventricle systolic function. She does have grade 1 diastolic dysfunction. She has mild tricuspid regurgitation.  She has not taken her amlodipine this week - due to leg swelling . She wants to keep it on her med list just in case her BP goes up .   She tried Nucala  injections for her asthma.   Did not tolerate it   Has lost 6 lbs   Nov. 2, 2017:  Doing well - she is followed for chronic diastolic congestive heart failure. Has issues with her asthma .  Active but no real exercise   Nov. 8, 2018:     [name] was admitted to the hospital on August 10 after falling and fracturing her left hip.  Nov. 8, 2019:  [name] is seen today for follow-up of her chronic diastolic congestive heart failure.  She has chronic asthma and chronic shortness of breath. Breathing is about the same .   Is on a new injection .  Leg swelling is under control , mild edema today  Had a hip replacement this point   June 20, 2019:  [name] is seen today for a follow-up visit.  She has a history of chest pain  Seems to be doing well. Has been watching her salt.    BP is 146 / 70 Still eating salty foods.   - loves her crackers.    Wakes at at night with occasional CP .   Lasts for a few min. Gets better when she sits up in the side of the bed  Does not get any exercise  Advised her  to reduce her salt intake   Current Outpatient Medications on File Prior to Visit  Medication Sig Dispense Refill  . acetaminophen (TYLENOL) 500 MG tablet Take 2 tablets (1,000 mg total) by mouth every 8 (eight) hours. (Patient taking differently: Take 1,000 mg by mouth every 6 (six) hours as needed (pain). ) 30 tablet 0  . albuterol (PROAIR HFA) 108 (90 Base) MCG/ACT inhaler Inhale 2 puffs into the lungs every 6 (six) hours as needed for wheezing or shortness of breath. 18 g 5  . albuterol (PROVENTIL) (2.5 MG/3ML) 0.083% nebulizer solution Take 3 mLs (2.5 mg total) by nebulization every 6 (six) hours as needed for wheezing or shortness of breath. Dx: J45.41 360 mL 0  . benzonatate (TESSALON) 100 MG capsule Take 1 capsule (100 mg total) by mouth 3 (three) times daily as needed for cough. 100 capsule 1  . Calcium Carbonate-Vitamin D (CALTRATE 600+D) 600-400 MG-UNIT per tablet Take 1  tablet by mouth daily.     . chlorpheniramine (CHLOR-TRIMETON) 4 MG tablet Take 4-8 mg by mouth See admin instructions. 4 mg in the morning and 8 mg at bedtime     . Cholecalciferol (VITAMIN D3) 1000 UNITS tablet Take 1,000 Units by mouth every evening.     [name] dextromethorphan (DELSYM) 30 MG/5ML liquid Take 60 mg by mouth 2 (two) times daily as needed for cough.    . dextromethorphan-guaiFENesin (MUCINEX DM) 30-600 MG 12hr tablet Take 1 tablet by mouth daily as needed for cough.    . fexofenadine (ALLEGRA) 180 MG tablet Take 180 mg by mouth daily.      . furosemide (LASIX) 20 MG tablet Take 20 mg by mouth daily.    [name] gabapentin (NEURONTIN) 300 MG capsule Take 300 mg by mouth daily as needed for pain.    [name] HYDROcodone-acetaminophen (NORCO/VICODIN) 5-325 MG tablet Take 1-2 tablets by mouth every 4 (four) hours as needed for moderate pain (pain score 4-6). 45 tablet 0  . ibandronate (BONIVA) 150 MG tablet Take 1 tablet by mouth every 30 (thirty) days.   3  . mometasone (NASONEX) 50 MCG/ACT nasal spray Place 2 sprays into the nose 2 (two) times daily. 17 g 5  . montelukast (SINGULAIR) 10 MG tablet TAKE 1 TABLET BY MOUTH EVERY NIGHT AT BEDTIME 90 tablet 1  . Multiple Vitamin (MULTIVITAMIN WITH MINERALS) TABS tablet Take 1 tablet by mouth daily. Centrum Silver Multivitamin    . Multiple Vitamins-Minerals (PRESERVISION AREDS PO) Take 1 tablet by mouth 2 (two) times daily.    [name] NEXIUM 40 MG capsule Take 1 capsule (40 mg total) by mouth 2 (two) times daily. 180 capsule 1  . potassium chloride (KLOR-CON) 10 MEQ tablet Take 1 tablet (10 mEq total) by mouth daily. 90 tablet 3  . pramipexole (MIRAPEX) 0.125 MG tablet Take 0.375 mg by mouth at bedtime.     . rosuvastatin (CRESTOR) 5 MG tablet TAKE 1 TABLET BY MOUTH EVERYDAY AT BEDTIME 90 tablet 2  . sennosides-docusate sodium (SENOKOT-S) 8.6-50 MG tablet Take 1 tablet by mouth daily as needed for constipation.    [name] SPIRIVA RESPIMAT 2.5 MCG/ACT AERS INHALE 2  PUFFS BY MOUTH INTO THE LUNGS DAILY 4 g 5  . SYMBICORT 160-4.5 MCG/ACT inhaler TAKE 2 PUFFS BY MOUTH TWICE A DAY (Patient taking differently: Inhale 2 puffs into the lungs 2 (two) times a day. ) 30.6 Inhaler 1  . telmisartan (MICARDIS) 20 MG tablet Take 20 mg by mouth daily.     [name]  vitamin B-12 (CYANOCOBALAMIN) 500 MCG tablet Take 500 mcg by mouth daily.      [name] Reasons 20 MG TABS tablet Take 20 mg by mouth daily with supper.     . DUPIXENT 300 MG/2ML prefilled syringe INJECT 300 MG SUBCUTANEOUSLY EVERY OTHER WEEK 4 mL 5  . Spacer/Aero-Holding Chambers (AEROCHAMBER MV) inhaler by Other route. Use as instructed      Current Facility-Administered Medications on File Prior to Visit  Medication Dose Route Frequency Provider Last Rate Last Admin  . omalizumab Arvid Right) injection 300 mg  300 mg Subcutaneous Q14 Days Brand Males, MD   300 mg at 07/27/17 1013  . omalizumab Arvid Right) injection 300 mg  300 mg Subcutaneous Q14 Days Brand Males, MD   300 mg at 08/13/17 0815  . omalizumab Arvid Right) injection 300 mg  300 mg Subcutaneous Q14 Days Brand Males, MD   300 mg at 08/27/17 0950    Allergies  Allergen Reactions  . Eggs Or Egg-Derived Products     UNSPECIFIED REACTION  RAW EGGS.. Can eat cooked eggs  . Influenza Vaccines     UNSPECIFIED REACTION  Egg allergy   . Nucala [Mepolizumab]     UNSPECIFIED REACTION   . Cephalosporins Rash  . Codeine Rash  . Gabapentin Nausea Only  . Levofloxacin Rash  . Pneumococcal Vaccines Rash    Past Medical History:  Diagnosis Date  . Anemia    after hysterectomy  . Asthma, severe persistent    pulmologist-- dr Lynford Citizen  . Cancer [facility])    Breast cancer on right  . Chronic kidney disease, stage II (mild)   . DDD (degenerative disc disease), cervical   . Diverticulitis   . Diverticulosis yrs ago   hx of   . Edema, lower extremity    occ both legs swell  . GERD (gastroesophageal reflux disease)   . Headache    sinus headaches  .  Heart murmur    no problems - per pt  . History of breast cancer 1990 left mastectomy also   1989  S/P   RIGHT MASTECTOMY ;  NO CHEMORADIATION //   NO RECURRENCE  . History of DVT of lower extremity 5 yrs ago   right leg  . History of shingles   . Hyperlipidemia   . Internal hemorrhoid   . Leg ulcer, left ([place])   . Osteoporosis, unspecified    Knee and hip osteoarthritis bilaterally  . Perennial allergic rhinitis   . Peripheral vascular disease ([place])   . Pneumonia   . Restless legs syndrome (RLS)   . Rheumatoid arthritis ([place])    hands  . Thyroid nodule    followed by dr perrini yearly, no current problam  . Unspecified essential hypertension     Past Surgical History:  Procedure Laterality Date  . CARDIAC CATHETERIZATION  08/ 01/ 2008   dr Cathie Olden   normal -- EF of 65%  . CARDIOVASCULAR STRESS TEST  05-22-2011   dr Cathie Olden   normal lexiscan perfusion study/  no ischemia/  lvsf  86%  . CATARACT EXTRACTION W/ INTRAOCULAR LENS  IMPLANT, BILATERAL    . CHOLECYSTECTOMY  1993   laparoscopic  . CONVERSION TO TOTAL HIP Left 12/03/2017   Procedure: CONVERSION TO LEFT TOTAL HIP;  Surgeon: Paralee Cancel, MD;  Location: [facility];  Service: Orthopedics;  Laterality: Left;  90 mins  . HIP ARTHROPLASTY Left 12/22/2016   Procedure: HEMIARTHROPLASTY, LEFT;  Surgeon: Paralee Cancel, MD;  Location: [facility];  Service: Orthopedics;  Laterality: Left;  . INCISION AND DRAINAGE OF WOUND Left 10/24/2013   Procedure: IRRIGATION AND DEBRIDEMENT OF LEFT LEG WITH PLACEMENT OF ACELL AND WOUND VAC;  Surgeon: Theodoro Kos, DO;  Location: [place];  Service: Plastics;  Laterality: Left;  . [place]  . MASTECTOMY Bilateral rigth 1989///   left  1990   breast cancer 1989//   fibrocytic disease 1990  . ORIF HUMERUS FRACTURE Right 09/21/2018   Procedure: OPEN REDUCTION INTERNAL FIXATION (ORIF) PROXIMAL HUMERUS FRACTURE;  Surgeon: Nicholes Stairs, MD;  Location: [facility];   Service: Orthopedics;  Laterality: Right;  90 mins  . TOTAL ABDOMINAL HYSTERECTOMY W/ BILATERAL SALPINGOOPHORECTOMY  1989   done at same time as right mastectomy  . TOTAL KNEE ARTHROPLASTY Right 10/05/2012   Procedure: RIGHT TOTAL KNEE ARTHROPLASTY;  Surgeon: Mauri Pole, MD;  Location: [facility];  Service: Orthopedics;  Laterality: Right;    Social History   Tobacco Use  Smoking Status Never Smoker  Smokeless Tobacco Never Used    Social History   Substance and Sexual Activity  Alcohol Use No    Family History  Problem Relation Age of Onset  . Heart attack Mother   . Asthma Mother   . Heart disease Mother   . Hyperlipidemia Mother   . Heart attack Father   . Heart failure Father   . Deep vein thrombosis Father   . Heart disease Father   . Peripheral vascular disease Father        amputation  . Cancer Brother   . Asthma Brother   . Coronary artery disease Sister   . Heart disease Sister     Reviw of Systems:  Reviewed in the HPI.  All other systems are negative.  Physical Exam: Blood pressure (!) 146/70, pulse 80, height 5\' 5"  (1.651 m), weight 141 lb (64 kg).  GEN:  Elderly female,  NAD  HEENT: Normal NECK: No JVD; No carotid bruits LYMPHATICS: No lymphadenopathy CARDIAC: RRR , no murmurs, rubs, gallops RESPIRATORY:  Clear to auscultation without rales, wheezing or rhonchi  ABDOMEN: Soft, non-tender, non-distended MUSCULOSKELETAL:  No edema; No deformity  SKIN: Warm and dry NEUROLOGIC:  Alert and oriented x 3   ECG:    Assessment / Plan:  : 1. Chest discomfort: Change.  Occasional episodes.  They do not sound cardiac.  We have a normal heart catheterization from 2008.  Vascular call me if she has any problems.    2. Chronic diastolic congestive heart failure:    Breathing seems to be under good control.  She still intubated so.  Have cautioned her about eating too much salt.  3. Dyspnea:        Mertie Moores, MD  06/20/2019 10:02 AM    [facility] [place],  [place], [place]  [address] Pager [phone] Phone: [phone]; Fax: [phone]

## 2019-06-20 NOTE — Patient Instructions (Signed)
Medication Instructions:   *If you need a refill on your cardiac medications before your next appointment, please call your pharmacy*  Lab Work:  If you have labs (blood work) drawn today and your tests are completely normal, you will receive your results only by: [name] MyChart Message (if you have MyChart) OR . A paper copy in the mail If you have any lab test that is abnormal or we need to change your treatment, we will call you to review the results.   Follow-Up: At [facility], you and your health needs are our priority.  As part of our continuing mission to provide you with exceptional heart care, we have created designated Provider Care Teams.  These Care Teams include your primary Cardiologist (physician) and Advanced Practice Providers (APPs -  Physician Assistants and Nurse Practitioners) who all work together to provide you with the care you need, when you need it.  Your next appointment:   1 year(s)  The format for your next appointment:   In Person  Provider:   You may see Mertie Moores, MD or one of the following Advanced Practice Providers on your designated Care Team:    Richardson Dopp, PA-C  [place], [place]  Daune Perch, [place]

## 2019-06-22 ENCOUNTER — Other Ambulatory Visit: Payer: Self-pay

## 2019-06-22 ENCOUNTER — Ambulatory Visit (INDEPENDENT_AMBULATORY_CARE_PROVIDER_SITE_OTHER): Payer: Medicare Other

## 2019-06-22 DIAGNOSIS — J4550 Severe persistent asthma, uncomplicated: Secondary | ICD-10-CM | POA: Diagnosis not present

## 2019-06-22 MED ORDER — DUPILUMAB 300 MG/2ML ~~LOC~~ SOSY
300.0000 mg | PREFILLED_SYRINGE | Freq: Once | SUBCUTANEOUS | Status: AC
  Administered 2019-06-22: 300 mg via SUBCUTANEOUS

## 2019-06-22 NOTE — Progress Notes (Signed)
Have you been hospitalized within the last 10 days?  No Do you have a fever?  No Do you have a cough?  No Do you have a headache or sore throat? No  

## 2019-07-06 ENCOUNTER — Other Ambulatory Visit: Payer: Self-pay

## 2019-07-06 ENCOUNTER — Ambulatory Visit (INDEPENDENT_AMBULATORY_CARE_PROVIDER_SITE_OTHER): Payer: Medicare Other

## 2019-07-06 DIAGNOSIS — J4550 Severe persistent asthma, uncomplicated: Secondary | ICD-10-CM

## 2019-07-06 MED ORDER — DUPILUMAB 300 MG/2ML ~~LOC~~ SOSY
300.0000 mg | PREFILLED_SYRINGE | Freq: Once | SUBCUTANEOUS | Status: AC
  Administered 2019-07-06: 09:00:00 300 mg via SUBCUTANEOUS

## 2019-07-06 NOTE — Progress Notes (Signed)
All questions were answered by the patient before medication was administered. Have you been hospitalized in the last 10 days? No Do you have a fever? No Do you have a cough? No Do you have a headache or sore throat? No  

## 2019-07-08 ENCOUNTER — Telehealth: Payer: Self-pay | Admitting: Internal Medicine

## 2019-07-08 NOTE — Telephone Encounter (Signed)
Dupixent Order: 300mg  #2 prefilled syringe Ordered Date: 07/08/19 Expected date of arrival: 07/12/19 Ordered by: Calcium: [facility]

## 2019-07-11 ENCOUNTER — Encounter: Payer: Self-pay | Admitting: Internal Medicine

## 2019-07-11 ENCOUNTER — Other Ambulatory Visit: Payer: Self-pay

## 2019-07-11 ENCOUNTER — Ambulatory Visit (INDEPENDENT_AMBULATORY_CARE_PROVIDER_SITE_OTHER): Payer: Medicare Other | Admitting: Internal Medicine

## 2019-07-11 VITALS — BP 116/74 | HR 85 | Ht 65.0 in | Wt 140.0 lb

## 2019-07-11 DIAGNOSIS — J322 Chronic ethmoidal sinusitis: Secondary | ICD-10-CM

## 2019-07-11 DIAGNOSIS — J4550 Severe persistent asthma, uncomplicated: Secondary | ICD-10-CM

## 2019-07-11 DIAGNOSIS — Z7189 Other specified counseling: Secondary | ICD-10-CM

## 2019-07-11 DIAGNOSIS — R768 Other specified abnormal immunological findings in serum: Secondary | ICD-10-CM

## 2019-07-11 DIAGNOSIS — Z7185 Encounter for immunization safety counseling: Secondary | ICD-10-CM

## 2019-07-11 MED ORDER — ARNUITY ELLIPTA 100 MCG/ACT IN AEPB: 1.0000 | INHALATION_SPRAY | Freq: Every day | RESPIRATORY_TRACT | 3 refills | Status: DC

## 2019-07-11 NOTE — Telephone Encounter (Signed)
Please see other telephone encounter.

## 2019-07-11 NOTE — Progress Notes (Signed)
C.C.:  Follow-up for Severe, Persistent Asthma, Chronic Allergic Rhinitis, DVT, & GERD w/ Hiatal Hernia.  HPI Patient has been seen twice in our office since her last appointment with me. Last visit was on 9/26 with nurse practitioner. Patient was treated for an exacerbation of her asthma with bronchitis. She was given a prednisone taper, instructed to use Mucinex twice daily as needed, and a 7 day course of Augmentin. Since her last appointment she fractured her left hip. She is continuing to walk with a cane. She reports no adverse effects from her Xolair. She does feel the Xolair is helping her significantly. Hasn't needed her rescue inhaler significantly in the last few weeks. Only rare nocturnal awakenings with any dyspnea.   Severe, persistent asthma: Patient currently maintained on a regimen of Xolair, Singulair, Spiriva, and Symbicort. She feels her dyspnea has significantly improved. She still has a scratchy throat & hoarse voice. She has her baseline nonproductive cough. No wheezing.  Chronic allergic rhinitis: Previously referred to ENT. Regimen at last appointment with me included Allegra, Singulair, Nasonex, and Xolair. Patient has had multiple different antibiotic courses for recurrent sinusitis. Only having intermittent sinus congestion & drainage.   DVT: Present in bilateral lower extremities. Patient on systemic anticoagulation with Xarelto indefinitely. No melena or hematochezia. No hematuria.   GERD with hiatal hernia: Previously prescribed Nexium. No reflux or dyspepsia. No morning brash water taste.   Review of Systems  No chest pain or tightness. No fever or chills. No abdominal pain or nausea.    OV 06/15/2017  Chief Complaint  Patient presents with  . Follow-up    flare ups with asthma when the weather changes.  Right now it is a cough, with congestions and wheezing. Non-productive dry cough. SOB with any activity.  Hoarseness.   86 year old female.  Transfer of  care from Dr. Ashok Cordia who is left the practice.  Moderate persistent asthma with an elevated IgE.  She is here with her husband.  She tells me that in 2015-2017 she was started on Xolair and then subsequently switched in [place] last year or year before last.  This then caused nonspecific side effect such as erythema in her face and nonspecific pain that reminded her of shingles.  Therefore she discontinued this and the side effects resolved.  She did have 6 doses of the interleukin-5 receptor antibody.  She went back on Xolair.  Overall she feels that since starting Xolair her quality of life and asthma exacerbations have improved but nevertheless she still gets asthma exacerbations every few months and has to go on prednisone.  Currently she states that for the last few to several days she has had increased cough although no change in sputum.  She is also wheezing a little bit more than usual.  Asthma control questionnaire shows for the last week she is waking up a few times at night.  When she wakes up she has moderate symptoms.  Her activities are slightly limited because of asthma.  She is moderate amount of shortness of breath because of asthma and she is wheezing a little of the time and using albuterol for rescue at least 1 or 2 times daily.  Exam nitric oxide is significantly elevated at 125 ppb.  She think she will benefit from prednisone  She she reports to me for the first time a new issue of left anterior cervical lymph node/submandibular lymph node.  She says it is been present for a while but other  physicians were examined it have not felt it.  She asked me to examine it.  She feels it is slowly growing.  Insidious onset for a year.   OV 08/13/2017  Chief Complaint  Patient presents with  . Follow-up    Pt states she has had many flare-ups with her asthma. States she has had a lot of sinus drainage and congestion and a lot of chest tightness.    86 year old female with complicated asthma    poor control of asthma continues.  At my last visit she did a prednisone taper.  Then a few weeks ago her sinuses acted up and therefore her asthma acted up and she did another prednisone taper on herself at home but it was just for a few days.  She continues on Spiriva, Symbicort, Singulair, Nexium and Xolair injections.  She feels that ever since Dr. Asencion Noble retired her asthma has been out of control.  Currently for the last few weeks her sinuses have been acting up and she wants antibiotics.  In particular she wants Augmentin.  She says in the past this was deemed as an allergy but she went and saw some doctor in [place] and it looks like she has been desensitized.  Penicillin is no longer listed as an allergy.  She insists that she wants Augmentin.  Review of the chart shows she has had chronic sinusitis and a CT scan of the sinus 1 year ago in February 2018.  She says she has seen ENT for this.  No surgery has been recommended.  Just nasal hygiene and antibiotic courses.  Currently her symptoms are quite significant with asthma control question of greater than 2 it appears this might all just be her baseline.  Her exam nitric oxide is still high over 100.  At night she is waking up several times.  When she wakes up she has moderate symptoms.  Activities are slightly limited because of asthma.  She has moderate amount of shortness of breath because of asthma.  She is wheezing hardly and she uses albuterol for rescue at least 1 or 2 times daily.   OV  Chief Complaint  Patient presents with  . Follow-up    Pt states her breathing has been much better since she began new injection but states she is coughing more than before. Denies any CP.    Follow-up severe persistent asthma with eosinophilia: In April 2019 we started dupulimab and stop the Xolair. With this asthma control is significantly better. Her asthma control questionnaire average score has drop to 0.6. Her exhaled nitric oxide is  drop from 100s to 43. She says she is much less short of breath and is able to do a lot more things. She is very pleased with the new injectable. However she feels that her baseline chronic cough is slightly worse. It is moderate in intensity is dry. It does not wake up at night. It is not associated with wheezing or shortness of breath. It is associated with Spiriva MDI. She does clear her throat. Coughing is made worse by talking. Off note, she has new issue of left hip surgery coming up in 2 days. She feels from a pulmonary standpoint she will be stable to do the surgery because of prior experience with surgery.        OV 03/08/2018  Subjective:  Patient ID: [name], female , DOB: [birth year] , age 5 y.o. , MRN: [id number] , ADDRESS: [address] [place] [id number]  03/08/2018 -   Chief Complaint  Patient presents with  . Follow-up    Sinus draniage and more cough    HPI [name] 86 y.o. -returns for asthma follow-up.  However the issue today is that she reports worsening of sinus drainage for the last 1 month ever since the fall weather change.  He says the sinus drainage is so significant that it is making her cough.  She feels asthma itself is under control.  In fact exam nitric oxide today is 38.  She is continuing inhalers Singulair Nexium and injection dupilumab.  She refused flu shot because she is allergic to it.  Review of the chart indicates March 2018 Dr. Ashok Cordia did a CT scan of the sinus and it showed pansinusitis.  She is says she is seen ENT from 2 years ago and is unclear if it was before the scan or after the scan.  At this point in time she just wants relief from her current sinus issue.  The sinus drainage is possibly clear but she has not looked at it.  There is no fever worsening wheeze or chest tightness.  05/17/2018 Patient presents today with acute complaints of cough and congestion. Accompanied by husband. Productive cough with clear mucus since  05/05/18. Husband and brother were both sick. She was seen at [facility] on 12/29 and given steroid injection and amoxicillin course. Finished antibiotic 2 days ago. Did not notice much of an improvement, if any. She has taken mucinex and delsym for cough. Hasn't had to use rescue inhaler or nebulizer. Due for Dupixent injection on 05/25/18. Did not receive flu injection d/t allergy. Afebrile.   05/25/2018 Patient presents for 1 week follow-up for asthma exacerbation. CXR showed no acute cardiopulmonary disease. Treated with prednisone taper. Due for dupixent injection today. Feeling better than she did last week, still has a cough but sputum has cleared. Felt better yesterday, rain made her symptoms worse. States anytime the weather changes her asthma flares. Using nebulizer as needed, didn't use it as much yesterday. Required a treatment this morning. Takes Singulair and allegra. Afebrile.       OV 06/08/2018  Subjective:  Patient ID: [name], female , DOB: 01-26-1933 , age 39 y.o. , MRN: [id number] , ADDRESS: [place] [address]   06/08/2018 -   Chief Complaint  Patient presents with  . Follow-up    Pt states she has had sinus problems, cough with clear mucus, and has also had some chest pain/tightness. Pt has been seen by Derl Barrow for asthma exacerbations a couple different times. Pt denies any fever.   Severe asthma on Dupixent.  Also with chronic sinusitis  HPI [name] 86 y.o. -returns for follow-up of her issues.  I last saw her in October 2019.  After that earlier this month she came to see nurse practitioner 2 times for acute on chronic sinus issues.  She tells me that she is tolerating her Dupixent injections just fine.  She feels it is helping her.  However, she is bothered by sinus issues this constantly sinus drainage particularly flared up in January 2020.  She recollects seeing Dr. Redmond Baseman in ENT.  At the time we recommended amoxicillin but she was  allergic to it and no antibiotic therefore was prescribed according to history.  Since then she has seen an allergist in her local area and is been cleared to get amoxicillin.  She is okay seeing her ENT but she wants to change to  her husband's ENT surgeon Dr. Elgie Congo.  Early this month with a sinus flush she got antibiotic and prednisone this helped her.  However she still has significant amount of residual symptoms.  Asthma control question is 2.8 but the nitric oxide level is normal.  This was excessive symptom burden.  She is waking up several times at night.  When she wakes up she has moderate symptoms she is moderately limited in her activities.  Moderate amount of shortness of breath and wheezing a little of the time and using albuterol for rescue at least 1 time daily.  She says is unimproved symptomatology because of the recent antibiotic and prednisone.  She feels she needs another course of antibiotic and prednisone as well.  In addition with the cough she is complaining of musculoskeletal pain in the upper chest and the back.  She feels the lungs are hurting.  There is no radiation.  The course is improving.  Of note, her past medical history lists her as having rheumatoid arthritis but she denies that to be the case.  She says she has osteoarthritis.  She does not see a rheumatologist.  She has had no prior immune work-up.    OV 02/28/2019  Subjective:  Patient ID: [name], female , DOB: Mar 29, 1933 , age 54 y.o. , MRN: [id number] , ADDRESS: [place] [address]   02/28/2019 -   Chief Complaint  Patient presents with  . Follow-up    She reports her breathing has been at her baseline.Allergy to eggs, no flu shot. Reports her dupixent has been great.      HPI [name] 86 y.o. -  Follow-up chronic severe persistent asthma: She is now on Dupixent, Symbicort, Spiriva and Singulair.  She says her asthma is well under control.  Albuterol rescue use is rare.   No nocturnal awakenings.  She has deferred flu shot because she states "I cannot take it"   Follow-up chronic sinusitis: She has seen ENT Dr. Lorelee Cover and has been prescribed Nasonex.  This is now under control.  New issue of ANA positivity 1: 1280 -in January 2020 because of chronic sinusitis I have ordered autoimmune and vasculitis profile.  Her ANCA profile is negative.  However her ANA is strongly positive.  She admits to having osteoarthritis and osteoporosis.  She denies she has rheumatoid arthritis although this is mentioned in her chart as past medical history.  She denies having seen a rheumatologist.  Review of the chart does not indicate she has had rheumatoid factor tested with an health system.    ROS - per HPI   OV 07/11/2019  Subjective:  Patient ID: [name], female , DOB: 05-Mar-1933 , age 47 y.o. , MRN: [id number] , ADDRESS: [place] [id number]   07/11/2019 -   Chief Complaint  Patient presents with  . Follow-up    Pt states she has been doing good since last visit. States she does have an occ cough.     HPI [name] 86 y.o. -  Follow-up chronic severe asthma: Last seen in October 2020.  Since then she continues with her durvalumab on a scheduled basis.  She visits our office for that.  However she has become less compliant with the Symbicort.  She for the month of February 2021 she is only taken it 2 times.  She also takes Spiriva only rarely.  She takes these 2 on a as needed basis.  She is compliant with her Singulair  on a daily basis.  Albuterol is also as needed.  Today because of the weather changes rainy and warm she feels a remote tightness in her chest.  We do not have exam nitric oxide test.  Asthma control question is 0.8 and is at the upper limit of good control.  She feels the Dupixent has worked tremendously well for her  In terms of her chronic sinusitis: She follows with ENT and takes Nasonex   In terms of her strong positivity  for ANA: Last visit check extended autoimmune profile.  SSA was positive.  She then had a televisit in December 2020 with Dr. Keturah Barre.  I reviewed that note.  She has a face-to-face visit coming up in March 2021.  She does admit to dryness of the eyes.  She might have Sjogren's and I informed this to her.   In terms of vaccine counseling.  She has had allergy to flu shot.  She has other allergies documented below.  She does not want to do the COVID-19 vaccine.  She is social distancing.  She does outdoor church.  She wants to know when life will return to normal.  We discussed summer is a possibility.  Asthma Control Panel  10/11/15 - IgE - 425 , rest of blood allergy panel negative. Blood eos 300cells (al;so 300 on 12/19/16). CT Sinus feb 2018 - chronic pan sinusitis     07/02/2016 06/15/2017  08/13/2017  12/01/2017  06/08/2018   Current Med Regimen  Spriva, symbicort,  Singulair, xolair, nexium same Spiriva, Symbicort, Singulair, Nexium and dupulimab Spiriva, Symbicort, Singulair, Nexium and dupulimab  ACQ 5 point- 1 week. wtd avg score. <1.0 is good control 0.75-1.25 is grey zone. >1.25 poor control. Delta 0.5 is clinically meaningful   2.4 0.6 2.9  FeNO ppB  126 ppb 104 ppb 43 19  FeV1  1.777/90%, ratio 74,       Planned intervention  for visit   HRCT, talk to eye doc and if ok start dupixent, augmenting, prednsione,        ROS - per HPI     has a past medical history of Anemia, Asthma, severe persistent, Cancer ([place]), Chronic kidney disease, stage II (mild), DDD (degenerative disc disease), cervical, Diverticulitis, Diverticulosis (yrs ago), Edema, lower extremity, GERD (gastroesophageal reflux disease), Headache, Heart murmur, History of breast cancer (1990 left mastectomy also), History of DVT of lower extremity (5 yrs ago), History of shingles, Hyperlipidemia, Internal hemorrhoid, Leg ulcer, left ([place]), Osteoporosis, unspecified, Perennial allergic rhinitis, Peripheral vascular disease ([place]),  Pneumonia, Restless legs syndrome (RLS), Rheumatoid arthritis ([place]), Thyroid nodule, and Unspecified essential hypertension.   reports that she has never smoked. She has never used smokeless tobacco.  Past Surgical History:  Procedure Laterality Date  . CARDIAC CATHETERIZATION  08/ 01/ 2008   dr Cathie Olden   normal -- EF of 65%  . CARDIOVASCULAR STRESS TEST  05-22-2011   dr Cathie Olden   normal lexiscan perfusion study/  no ischemia/  lvsf  86%  . CATARACT EXTRACTION W/ INTRAOCULAR LENS  IMPLANT, BILATERAL    . CHOLECYSTECTOMY  1993   laparoscopic  . CONVERSION TO TOTAL HIP Left 12/03/2017   Procedure: CONVERSION TO LEFT TOTAL HIP;  Surgeon: Paralee Cancel, MD;  Location: [facility];  Service: Orthopedics;  Laterality: Left;  90 mins  . HIP ARTHROPLASTY Left 12/22/2016   Procedure: HEMIARTHROPLASTY, LEFT;  Surgeon: Paralee Cancel, MD;  Location: [facility];  Service: Orthopedics;  Laterality: Left;  . INCISION AND DRAINAGE OF  WOUND Left 10/24/2013   Procedure: IRRIGATION AND DEBRIDEMENT OF LEFT LEG WITH PLACEMENT OF ACELL AND WOUND [place];  Surgeon: Theodoro Kos, DO;  Location: [facility];  Service: Plastics;  Laterality: Left;  . [place]  . MASTECTOMY Bilateral rigth 1989///   left  1990   breast cancer 1989//   fibrocytic disease 1990  . ORIF HUMERUS FRACTURE Right 09/21/2018   Procedure: OPEN REDUCTION INTERNAL FIXATION (ORIF) PROXIMAL HUMERUS FRACTURE;  Surgeon: Nicholes Stairs, MD;  Location: [facility];  Service: Orthopedics;  Laterality: Right;  90 mins  . TOTAL ABDOMINAL HYSTERECTOMY W/ BILATERAL SALPINGOOPHORECTOMY  1989   done at same time as right mastectomy  . TOTAL KNEE ARTHROPLASTY Right 10/05/2012   Procedure: RIGHT TOTAL KNEE ARTHROPLASTY;  Surgeon: Mauri Pole, MD;  Location: [facility];  Service: Orthopedics;  Laterality: Right;    Allergies  Allergen Reactions  . Eggs Or Egg-Derived Products     UNSPECIFIED REACTION  RAW EGGS.. Can eat cooked eggs   . Influenza Vaccines     UNSPECIFIED REACTION  Egg allergy   . Nucala [Mepolizumab]     UNSPECIFIED REACTION   . Cephalosporins Rash  . Codeine Rash  . Gabapentin Nausea Only  . Levofloxacin Rash  . Pneumococcal Vaccines Rash    Immunization History  Administered Date(s) Administered  . Pneumococcal Conjugate-13 09/14/2012  . Pneumococcal Polysaccharide-23 01/31/2009  . Tdap 09/07/2018  . Zoster Recombinat (Shingrix) 04/18/2017, 09/16/2017    Family History  Problem Relation Age of Onset  . Heart attack Mother   . Asthma Mother   . Heart disease Mother   . Hyperlipidemia Mother   . Heart attack Father   . Heart failure Father   . Deep vein thrombosis Father   . Heart disease Father   . Peripheral vascular disease Father        amputation  . Cancer Brother   . Asthma Brother   . Coronary artery disease Sister   . Heart disease Sister      Current Outpatient Medications:  .  acetaminophen (TYLENOL) 500 MG tablet, Take 2 tablets (1,000 mg total) by mouth every 8 (eight) hours. (Patient taking differently: Take 1,000 mg by mouth every 6 (six) hours as needed (pain). ), Disp: 30 tablet, Rfl: 0 .  albuterol (PROAIR HFA) 108 (90 Base) MCG/ACT inhaler, Inhale 2 puffs into the lungs every 6 (six) hours as needed for wheezing or shortness of breath., Disp: 18 g, Rfl: 5 .  benzonatate (TESSALON) 100 MG capsule, Take 1 capsule (100 mg total) by mouth 3 (three) times daily as needed for cough., Disp: 100 capsule, Rfl: 1 .  Calcium Carbonate-Vitamin D (CALTRATE 600+D) 600-400 MG-UNIT per tablet, Take 1 tablet by mouth daily. , Disp: , Rfl:  .  chlorpheniramine (CHLOR-TRIMETON) 4 MG tablet, Take 4-8 mg by mouth See admin instructions. 4 mg in the morning and 8 mg at bedtime , Disp: , Rfl:  .  Cholecalciferol (VITAMIN D3) 1000 UNITS tablet, Take 1,000 Units by mouth every evening. , Disp: , Rfl:  .  dextromethorphan (DELSYM) 30 MG/5ML liquid, Take 60 mg by mouth 2 (two) times daily  as needed for cough., Disp: , Rfl:  .  DUPIXENT 300 MG/2ML prefilled syringe, INJECT 300 MG SUBCUTANEOUSLY EVERY OTHER WEEK, Disp: 4 mL, Rfl: 5 .  fexofenadine (ALLEGRA) 180 MG tablet, Take 180 mg by mouth daily.  , Disp: , Rfl:  .  furosemide (  LASIX) 20 MG tablet, Take 20 mg by mouth daily., Disp: , Rfl:  .  ibandronate (BONIVA) 150 MG tablet, Take 1 tablet by mouth every 30 (thirty) days. , Disp: , Rfl: 3 .  ipratropium (ATROVENT) 0.06 % nasal spray, Place 2 sprays into both nostrils 2 (two) times daily., Disp: , Rfl:  .  mometasone (NASONEX) 50 MCG/ACT nasal spray, Place 2 sprays into the nose 2 (two) times daily., Disp: 17 g, Rfl: 5 .  montelukast (SINGULAIR) 10 MG tablet, TAKE 1 TABLET BY MOUTH EVERY NIGHT AT BEDTIME, Disp: 90 tablet, Rfl: 1 .  Multiple Vitamin (MULTIVITAMIN WITH MINERALS) TABS tablet, Take 1 tablet by mouth daily. Centrum Silver Multivitamin, Disp: , Rfl:  .  Multiple Vitamins-Minerals (PRESERVISION AREDS PO), Take 1 tablet by mouth 2 (two) times daily., Disp: , Rfl:  .  NEXIUM 40 MG capsule, Take 1 capsule (40 mg total) by mouth 2 (two) times daily., Disp: 180 capsule, Rfl: 1 .  potassium chloride (KLOR-CON) 10 MEQ tablet, Take 1 tablet (10 mEq total) by mouth daily., Disp: 90 tablet, Rfl: 3 .  pramipexole (MIRAPEX) 0.125 MG tablet, Take 0.375 mg by mouth at bedtime. , Disp: , Rfl:  .  rosuvastatin (CRESTOR) 5 MG tablet, TAKE 1 TABLET BY MOUTH EVERYDAY AT BEDTIME, Disp: 90 tablet, Rfl: 2 .  Spacer/Aero-Holding Chambers (AEROCHAMBER MV) inhaler, by Other route. Use as instructed , Disp: , Rfl:  .  SPIRIVA RESPIMAT 2.5 MCG/ACT AERS, INHALE 2 PUFFS BY MOUTH INTO THE LUNGS DAILY, Disp: 4 g, Rfl: 5 .  telmisartan (MICARDIS) 20 MG tablet, Take 20 mg by mouth daily. , Disp: , Rfl:  .  vitamin B-12 (CYANOCOBALAMIN) 500 MCG tablet, Take 500 mcg by mouth daily.  , Disp: , Rfl:  .  XARELTO 20 MG TABS tablet, Take 20 mg by mouth daily with supper. , Disp: , Rfl:  .  albuterol  (PROVENTIL) (2.5 MG/3ML) 0.083% nebulizer solution, Take 3 mLs (2.5 mg total) by nebulization every 6 (six) hours as needed for wheezing or shortness of breath. Dx: J45.41 (Patient not taking: Reported on 07/11/2019), Disp: 360 mL, Rfl: 0 .  dextromethorphan-guaiFENesin (MUCINEX DM) 30-600 MG 12hr tablet, Take 1 tablet by mouth daily as needed for cough., Disp: , Rfl:  .  Fluticasone Furoate (ARNUITY ELLIPTA) 100 MCG/ACT AEPB, Inhale 1 puff into the lungs daily., Disp: 90 each, Rfl: 3 .  gabapentin (NEURONTIN) 300 MG capsule, Take 300 mg by mouth daily as needed for pain., Disp: , Rfl:   Current Facility-Administered Medications:  .  omalizumab (XOLAIR) injection 300 mg, 300 mg, Subcutaneous, Q14 Days, Darron Stuck, MD, 300 mg at 07/27/17 1013 .  omalizumab Arvid Right) injection 300 mg, 300 mg, Subcutaneous, Q14 Days, Shakur Lembo, MD, 300 mg at 08/13/17 0815 .  omalizumab Arvid Right) injection 300 mg, 300 mg, Subcutaneous, Q14 Days, Talene Glastetter, Belva Crome, MD, 300 mg at 08/27/17 0950      Objective:   Vitals:   07/11/19 1143  BP: 116/74  Pulse: 85  SpO2: 100%  Weight: 140 lb (63.5 kg)  Height: 5\' 5"  (1.651 m)    Estimated body mass index is 23.3 kg/m as calculated from the following:   Height as of this encounter: 5\' 5"  (1.651 m).   Weight as of this encounter: 140 lb (63.5 kg).  @WEIGHTCHANGE @  [employer]   07/11/19 1143  Weight: 140 lb (63.5 kg)     Physical Exam Alert and oriented x3.  Speech normal.  Oral cavity is  normal.  Chest is clear to auscultation.  No wheeze.  Normal heart sounds.  No sinus no clubbing or edema.        Assessment:       ICD-10-CM   1. Vaccine counseling  Z71.89   2. Severe persistent asthma without complication  123XX123   3. Chronic panethmoidal sinusitis  J32.2   4. Positive ANA (antinuclear antibody)  R76.8        Plan:     Patient Instructions  Severe persistent asthma without complication  -under control but slightly active  07/11/2019 due to weather and reflective of fact you rarely take your symbicort  - understand insurance issues with symbicort  Plan  - continue dupixent,  and singulair = stop Spiriva - change symbicort to arnuity daily low dose - let me know if insurance issues - respect flu shot and covid shot deferral due to allergy   Chronic panethmoidal sinusitis -contnue advise per Dr Elgie Congo and nasonex   ANA positivity, SSA positivity  but ANCA negative  - gald you are seeing Dr D later in march 2021  Vaccine counseling-new issue  -Given urology to flu shot respected desire not to take Covid vaccine  Followup  6 months  or sooner if needed; ACQ and FeNO if posible       SIGNATURE    Dr. Brand Males, M.D., F.C.C.P,  Pulmonary and Critical Care Medicine Staff Physician, [place] Director - Interstitial Lung Disease  Program  Pulmonary [place] [facility] [place], [place], [address]  Pager: [phone], If no answer or between  15:00h - 7:00h: call [phone] Telephone: [phone]  12:57 PM 07/11/2019

## 2019-07-11 NOTE — Patient Instructions (Addendum)
Severe persistent asthma without complication  -under control but slightly active 07/11/2019 due to weather and reflective of fact you rarely take your symbicort  - understand insurance issues with symbicort  Plan  - continue dupixent,  and singulair = stop Spiriva - change symbicort to arnuity daily low dose - let me know if insurance issues - respect flu shot and covid shot deferral due to allergy   Chronic panethmoidal sinusitis -contnue advise per Dr Elgie Congo and nasonex   ANA positivity, SSA positivity  but ANCA negative  - gald you are seeing Dr D later in march 2021  Vaccine counseling-new issue  -Given urology to flu shot respected desire not to take Covid vaccine  Followup  6 months  or sooner if needed; ACQ and FeNO if posible

## 2019-07-12 NOTE — Telephone Encounter (Signed)
Dupixent Shipment Received: 300mg  #2 prefilled syringe Medication arrival date: 07/12/2019 Lot #: [id number] Exp date: 10/2021 Received by: Desmond Dike, [place]

## 2019-07-20 ENCOUNTER — Ambulatory Visit (INDEPENDENT_AMBULATORY_CARE_PROVIDER_SITE_OTHER): Payer: Medicare Other

## 2019-07-20 ENCOUNTER — Other Ambulatory Visit: Payer: Self-pay

## 2019-07-20 DIAGNOSIS — J4550 Severe persistent asthma, uncomplicated: Secondary | ICD-10-CM

## 2019-07-20 MED ORDER — DUPILUMAB 300 MG/2ML ~~LOC~~ SOSY
300.0000 mg | PREFILLED_SYRINGE | Freq: Once | SUBCUTANEOUS | Status: AC
  Administered 2019-07-20: 300 mg via SUBCUTANEOUS

## 2019-07-20 NOTE — Progress Notes (Signed)
All questions were answered by the patient before medication was administered. Have you been hospitalized in the last 10 days? No Do you have a fever? No Do you have a cough? No Do you have a headache or sore throat? No  

## 2019-07-22 DIAGNOSIS — M7541 Impingement syndrome of right shoulder: Secondary | ICD-10-CM | POA: Diagnosis not present

## 2019-07-25 ENCOUNTER — Telehealth: Payer: Self-pay | Admitting: Internal Medicine

## 2019-07-25 NOTE — Telephone Encounter (Signed)
Pt was started on Arnuity at last OV.  Called and spoke with pt who believes she has had a reaction to arnuity. Pt stated she just started taking the [place] last Wednesday 3/10 and stated two days after taking the inhaler she noticed a red streak on face and then stated she had some issues with feeling like throat was tight.  Pt stated that she stopped taking the inhaler 2 days ago and states since then she is feeing better.  Pt stated since beginning the arnuity her cough got worse.  Pt said she believes she might have to go back on the Symbicort and also wants recommendations. Dr. Chase Caller, please advise on this for pt.

## 2019-07-25 NOTE — Telephone Encounter (Signed)
Pt called again and specified she needs a call back.

## 2019-07-29 MED ORDER — BUDESONIDE-FORMOTEROL FUMARATE 160-4.5 MCG/ACT IN AERO: 2.0000 | INHALATION_SPRAY | Freq: Two times a day (BID) | RESPIRATORY_TRACT | 3 refills | Status: DC

## 2019-07-29 NOTE — Telephone Encounter (Signed)
Okay to go back on Symbicort as before.

## 2019-07-29 NOTE — Telephone Encounter (Signed)
Called and spoke with pt letting her know that MR said to go back on symbicort and she verbalized understanding. Pt needed a new rx sent to pharmacy so I have sent that to preferred pharmacy for pt. Nothing further needed.

## 2019-07-30 ENCOUNTER — Other Ambulatory Visit: Payer: Self-pay | Admitting: Internal Medicine

## 2019-08-03 ENCOUNTER — Other Ambulatory Visit: Payer: Self-pay

## 2019-08-03 ENCOUNTER — Ambulatory Visit (INDEPENDENT_AMBULATORY_CARE_PROVIDER_SITE_OTHER): Payer: Medicare Other

## 2019-08-03 DIAGNOSIS — J4550 Severe persistent asthma, uncomplicated: Secondary | ICD-10-CM

## 2019-08-03 MED ORDER — DUPILUMAB 300 MG/2ML ~~LOC~~ SOSY
300.0000 mg | PREFILLED_SYRINGE | Freq: Once | SUBCUTANEOUS | Status: AC
  Administered 2019-08-03: 300 mg via SUBCUTANEOUS

## 2019-08-03 NOTE — Progress Notes (Signed)
All questions were answered by the patient before medication was administered. Have you been hospitalized in the last 10 days? No Do you have a fever? No Do you have a cough? No Do you have a headache or sore throat? No  

## 2019-08-16 NOTE — Progress Notes (Signed)
Office Visit Note  Patient: [name]             Date of Birth: August 14, 1932           MRN: [id number]             PCP: Crist Infante, MD Referring: Crist Infante, MD Visit Date: 08/22/2019 Occupation: '@GUAROCC' @  Subjective:  Positive ANA, positive Ro antibody.   History of Present Illness: [name] is a 86 y.o. female with history of sicca symptoms and positive Ro antibody.  She returns today after her initial virtual visit in December.  She has been seeing Dr. Chase Caller for lung nodule and asthma.  He did some lab work which came positive for ANA and anti-Ro antibody.  Patient gives history of mild dry eye symptoms.  She denies any history of dry mouth.  There is no history of joint swelling.  There is no history of Raynaud's, lymphadenopathy, malar rash, oral or nasal ulcers.  Activities of Daily Living:  Patient reports morning stiffness for 0 none.   Patient Reports nocturnal pain.  Difficulty dressing/grooming: Reports Difficulty climbing stairs: Reports Difficulty getting out of chair: Reports Difficulty using hands for taps, buttons, cutlery, and/or writing: Reports  Review of Systems  Constitutional: Positive for fatigue. Negative for night sweats, weight gain and weight loss.  HENT: Negative for mouth sores, trouble swallowing, trouble swallowing, mouth dryness and nose dryness.   Eyes: Positive for dryness. Negative for pain, redness and visual disturbance.  Respiratory: Positive for shortness of breath. Negative for cough and difficulty breathing.   Cardiovascular: Positive for swelling in legs/feet. Negative for chest pain, palpitations, hypertension and irregular heartbeat.  Gastrointestinal: Negative for blood in stool, constipation and diarrhea.  Endocrine: Negative for excessive thirst and increased urination.  Genitourinary: Negative for difficulty urinating and vaginal dryness.  Musculoskeletal: Positive for arthralgias and joint pain. Negative for joint  swelling, myalgias, muscle weakness, morning stiffness, muscle tenderness and myalgias.  Skin: Negative for color change, rash, hair loss, skin tightness, ulcers and sensitivity to sunlight.  Allergic/Immunologic: Negative for susceptible to infections.  Neurological: Negative for dizziness, numbness, memory loss, night sweats and weakness.  Hematological: Positive for bruising/bleeding tendency. Negative for swollen glands.  Psychiatric/Behavioral: Positive for sleep disturbance. Negative for depressed mood. The patient is not nervous/anxious.     PMFS History:  Patient Active Problem List   Diagnosis Date Noted  . Chest pain 12/22/2018  . Displaced fracture of greater tuberosity of right humerus, initial encounter for closed fracture 09/21/2018  . Closed fracture of right proximal humerus 09/21/2018  . S/P left TH revision 12/03/2017  . Essential hypertension 11/17/2017  . Chronic diastolic CHF (congestive heart failure) ([place]) 03/19/2017  . CAD (coronary artery disease), native coronary artery 12/20/2016  . Closed left hip fracture ([place]) 12/19/2016  . CKD (chronic kidney disease) stage 3, GFR 30-59 ml/min 12/19/2016  . History of DVT of lower extremity   . Lung nodule 08/03/2014  . Peripheral vascular disease, unspecified ([place]) 09/12/2013  . Hyperlipidemia 03/03/2013  . Expected blood loss anemia 10/06/2012  . Overweight (BMI 25.0-29.9) 10/06/2012  . DJD (degenerative joint disease) 05/26/2012  . Severe persistent asthma 08/05/2007  . Hypertensive heart disease without CHF 04/30/2007  . GERD 01/13/2007    Past Medical History:  Diagnosis Date  . Anemia    after hysterectomy  . Asthma, severe persistent    pulmologist-- dr Lynford Citizen  . Cancer [facility])    Breast cancer on right  . Chronic  kidney disease, stage II (mild)   . DDD (degenerative disc disease), cervical   . Diverticulitis   . Diverticulosis yrs ago   hx of   . Edema, lower extremity    occ both legs swell  .  GERD (gastroesophageal reflux disease)   . Headache    sinus headaches  . Heart murmur    no problems - per pt  . History of breast cancer 1990 left mastectomy also   1989  S/P   RIGHT MASTECTOMY ;  NO CHEMORADIATION //   NO RECURRENCE  . History of DVT of lower extremity 5 yrs ago   right leg  . History of shingles   . Hyperlipidemia   . Internal hemorrhoid   . Leg ulcer, left ([place])   . Osteoporosis, unspecified    Knee and hip osteoarthritis bilaterally  . Perennial allergic rhinitis   . Peripheral vascular disease ([place])   . Pneumonia   . Restless legs syndrome (RLS)   . Rheumatoid arthritis ([place])    hands  . Thyroid nodule    followed by dr perrini yearly, no current problam  . Unspecified essential hypertension     Family History  Problem Relation Age of Onset  . Heart attack Mother   . Asthma Mother   . Heart disease Mother   . Hyperlipidemia Mother   . Heart attack Father   . Heart failure Father   . Deep vein thrombosis Father   . Heart disease Father   . Peripheral vascular disease Father        amputation  . Cancer Brother   . Asthma Brother   . Coronary artery disease Sister   . Heart disease Sister    Past Surgical History:  Procedure Laterality Date  . CARDIAC CATHETERIZATION  08/ 01/ 2008   dr Cathie Olden   normal -- EF of 65%  . CARDIOVASCULAR STRESS TEST  05-22-2011   dr Cathie Olden   normal lexiscan perfusion study/  no ischemia/  lvsf  86%  . CATARACT EXTRACTION W/ INTRAOCULAR LENS  IMPLANT, BILATERAL    . CHOLECYSTECTOMY  1993   laparoscopic  . CONVERSION TO TOTAL HIP Left 12/03/2017   Procedure: CONVERSION TO LEFT TOTAL HIP;  Surgeon: Paralee Cancel, MD;  Location: [facility];  Service: Orthopedics;  Laterality: Left;  90 mins  . HIP ARTHROPLASTY Left 12/22/2016   Procedure: HEMIARTHROPLASTY, LEFT;  Surgeon: Paralee Cancel, MD;  Location: [facility];  Service: Orthopedics;  Laterality: Left;  . INCISION AND DRAINAGE OF WOUND Left 10/24/2013   Procedure: IRRIGATION  AND DEBRIDEMENT OF LEFT LEG WITH PLACEMENT OF ACELL AND WOUND VAC;  Surgeon: Theodoro Kos, DO;  Location: [place];  Service: Plastics;  Laterality: Left;  . KNEE ARTHROPLASTY    . [place]  . MASTECTOMY Bilateral rigth 1989///   left  1990   breast cancer 1989//   fibrocytic disease 1990  . ORIF HUMERUS FRACTURE Right 09/21/2018   Procedure: OPEN REDUCTION INTERNAL FIXATION (ORIF) PROXIMAL HUMERUS FRACTURE;  Surgeon: Nicholes Stairs, MD;  Location: [place];  Service: Orthopedics;  Laterality: Right;  90 mins  . TOTAL ABDOMINAL HYSTERECTOMY W/ BILATERAL SALPINGOOPHORECTOMY  1989   done at same time as right mastectomy  . TOTAL KNEE ARTHROPLASTY Right 10/05/2012   Procedure: RIGHT TOTAL KNEE ARTHROPLASTY;  Surgeon: Mauri Pole, MD;  Location: [facility];  Service: Orthopedics;  Laterality: Right;   Social History   Social  History Narrative   Originally from [place]. Always lived in [place]. Has traveled across the country to [place]. 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 No known asbestos exposure (brother with mesothelioma). No pets currently. No bird exposure. No mold exposure. Carpet has been removed from her home.    Immunization History  Administered Date(s) Administered  . Pneumococcal Conjugate-13 09/14/2012  . Pneumococcal Polysaccharide-23 01/31/2009  . Tdap 09/07/2018  . Zoster Recombinat (Shingrix) 04/18/2017, 09/16/2017     Objective: Vital Signs: BP 140/69 (BP Location: Left Arm, Patient Position: Sitting, Cuff Size: Normal)   Pulse 87   Resp 18   Ht 5' 4.5" (1.638 m)   Wt 143 lb 12.8 oz (65.2 kg)   BMI 24.30 kg/m    Physical Exam Vitals and nursing note reviewed.  Constitutional:      Appearance: She is well-developed.  HENT:     Head: Normocephalic and atraumatic.  Eyes:     Conjunctiva/sclera: Conjunctivae normal.  Cardiovascular:     Rate and Rhythm: Normal rate and regular rhythm.      Heart sounds: Normal heart sounds.  Pulmonary:     Effort: Pulmonary effort is normal.     Breath sounds: Normal breath sounds.  Abdominal:     General: Bowel sounds are normal.     Palpations: Abdomen is soft.  Musculoskeletal:     Cervical back: Normal range of motion.  Lymphadenopathy:     Cervical: No cervical adenopathy.  Skin:    General: Skin is warm and dry.     Capillary Refill: Capillary refill takes less than 2 seconds.  Neurological:     Mental Status: She is alert and oriented to person, place, and time.  Psychiatric:        Behavior: Behavior normal.      Musculoskeletal Exam: C-spine and lumbar spine with good range of motion.  Her right shoulder joint abduction was limited to 40 degrees.  Left shoulder joint was full.  Other joints are in good range of motion.  She has bilateral CMC PIP and DIP thickening with no synovitis.  No MCP involvement was noted.  Hip joints were in good range of motion.  Left hip joint is replaced.  Right knee joint is replaced without any warmth or swelling.  She has overcrowding of chills and PIP and DIP thickening consistent with osteoarthritis.  CDAI Exam: CDAI Score: -- Patient Global: --; Provider Global: -- Swollen: --; Tender: -- Joint Exam 08/22/2019   No joint exam has been documented for this visit   There is currently no information documented on the homunculus. Go to the Rheumatology activity and complete the homunculus joint exam.  Investigation: No additional findings.  Imaging: No results found.  Recent Labs: Lab Results  Component Value Date   WBC 6.0 12/22/2018   HGB 13.9 12/22/2018   PLT 271 12/22/2018   NA 136 12/24/2018   K 3.9 12/24/2018   CL 100 12/24/2018   CO2 28 12/24/2018   GLUCOSE 113 (H) 12/24/2018   BUN 17 12/24/2018   CREATININE 1.07 (H) 12/24/2018   BILITOT 0.3 03/19/2017   ALKPHOS 84 03/19/2017   AST 18 03/19/2017   ALT 12 03/19/2017   PROT 6.8 03/19/2017   ALBUMIN 4.0 03/19/2017    CALCIUM 9.2 12/24/2018   GFRAA 56 (L) 12/24/2018   February 28, 2019 RF negative, anti-CCP negative, SSA antibody positive, SSB antibody negative, SCL 70 -  06/08/18: ANA 1:1280 cytoplasmic, ESR 87,  C-ANCA 1:20, myeloperoxidase-  Speciality Comments: No specialty comments available.  Procedures:  No procedures performed Allergies: Eggs or egg-derived products, Influenza vaccines, Nucala [mepolizumab], Cephalosporins, Codeine, Gabapentin, Levofloxacin, and Pneumococcal vaccines   Assessment / Plan:     Visit Diagnoses: Positive ANA (antinuclear antibody) - Positive ANA 1: 1280 cytoplasmic, Ro >8, mild sicca symptoms. -Besides mild sicca symptoms she does not have any other symptoms of autoimmune disease.  Patient denies any symptoms of oral ulcers, nasal ulcers, malar rash, photosensitivity, Raynaud's phenomenon or lymphadenopathy.  To complete the work-up I will obtain following labs today.  She gives history of DVTs in the past.  Based on her symptoms I do not think she needs an immunosuppressive agent. Plan: CBC with Differential/Platelet, COMPLETE METABOLIC PANEL WITH GFR, Urinalysis, Routine w reflex microscopic, Sedimentation rate, RNP Antibody, Anti-Smith antibody, Anti-DNA antibody, double-stranded, C3 and C4, Beta-2 glycoprotein antibodies, Cardiolipin antibodies, IgG, IgM, IgA, Lupus Anticoagulant Eval w/Reflex, Glucose 6 phosphate dehydrogenase  S/P left TH revision-she had surgery by Dr. Alvan Dame and has been doing well.  Status post total knee replacement, right-by Dr. Alvan Dame patient has good results and good range of motion.  History of humerus fracture - Right greater tuberosity, 09/07/18.  She has limited range of motion of her right shoulder joint.  DDD (degenerative disc disease), lumbar-she has good range of motion but has discomfort off and on.  Lung nodule - Followed by Dr. Chase Caller.  Severe persistent asthma without complication-followed by Dr. Chase Caller.  Essential  hypertension-her systolic blood pressure was elevated today.  Other medical problems are listed as follows:  Chronic diastolic CHF (congestive heart failure) ([place])  Coronary artery disease involving native coronary artery of native heart without angina pectoris  History of DVT of lower extremity - Plan: Beta-2 glycoprotein antibodies, Cardiolipin antibodies, IgG, IgM, IgA, Lupus Anticoagulant Eval w/Reflex  History of hyperlipidemia  History of gastroesophageal reflux (GERD)  Stage 3 chronic kidney disease, unspecified whether stage 3a or 3b CKD  Orders: Orders Placed This Encounter  Procedures  . CBC with Differential/Platelet  . COMPLETE METABOLIC PANEL WITH GFR  . Urinalysis, Routine w reflex microscopic  . Sedimentation rate  . RNP Antibody  . Anti-Smith antibody  . Anti-DNA antibody, double-stranded  . C3 and C4  . Beta-2 glycoprotein antibodies  . Cardiolipin antibodies, IgG, IgM, IgA  . Lupus Anticoagulant Eval w/Reflex  . Glucose 6 phosphate dehydrogenase   No orders of the defined types were placed in this encounter.   Face-to-face time spent with patient was 30 minutes. Greater than 50% of time was spent in counseling and coordination of care.  Follow-Up Instructions: Return in about 3 months (around 11/21/2019) for +ANA, +Ro.   Bo Merino, MD  Note - This record has been created using Editor, commissioning.  Chart creation errors have been sought, but may not always  have been located. Such creation errors do not reflect on  the standard of medical care.

## 2019-08-17 ENCOUNTER — Ambulatory Visit (INDEPENDENT_AMBULATORY_CARE_PROVIDER_SITE_OTHER): Payer: Medicare Other

## 2019-08-17 ENCOUNTER — Other Ambulatory Visit: Payer: Self-pay

## 2019-08-17 DIAGNOSIS — J4550 Severe persistent asthma, uncomplicated: Secondary | ICD-10-CM

## 2019-08-17 MED ORDER — DUPILUMAB 300 MG/2ML ~~LOC~~ SOSY
300.0000 mg | PREFILLED_SYRINGE | Freq: Once | SUBCUTANEOUS | Status: AC
  Administered 2019-08-17: 300 mg via SUBCUTANEOUS

## 2019-08-17 NOTE — Progress Notes (Signed)
Have you been hospitalized within the last 10 days?  No Do you have a fever?  No Do you have a cough?  No Do you have a headache or sore throat? No Do you have your Epi Pen visible and is it within date?  Yes 

## 2019-08-22 ENCOUNTER — Other Ambulatory Visit: Payer: Self-pay

## 2019-08-22 ENCOUNTER — Ambulatory Visit (INDEPENDENT_AMBULATORY_CARE_PROVIDER_SITE_OTHER): Payer: Medicare Other | Admitting: Rheumatology

## 2019-08-22 ENCOUNTER — Encounter: Payer: Self-pay | Admitting: Rheumatology

## 2019-08-22 VITALS — BP 140/69 | HR 87 | Resp 18 | Ht 64.5 in | Wt 143.8 lb

## 2019-08-22 DIAGNOSIS — Z86718 Personal history of other venous thrombosis and embolism: Secondary | ICD-10-CM

## 2019-08-22 DIAGNOSIS — J4550 Severe persistent asthma, uncomplicated: Secondary | ICD-10-CM

## 2019-08-22 DIAGNOSIS — Z8639 Personal history of other endocrine, nutritional and metabolic disease: Secondary | ICD-10-CM | POA: Diagnosis not present

## 2019-08-22 DIAGNOSIS — M5136 Other intervertebral disc degeneration, lumbar region: Secondary | ICD-10-CM | POA: Diagnosis not present

## 2019-08-22 DIAGNOSIS — Z96651 Presence of right artificial knee joint: Secondary | ICD-10-CM | POA: Diagnosis not present

## 2019-08-22 DIAGNOSIS — I10 Essential (primary) hypertension: Secondary | ICD-10-CM

## 2019-08-22 DIAGNOSIS — R768 Other specified abnormal immunological findings in serum: Secondary | ICD-10-CM | POA: Diagnosis not present

## 2019-08-22 DIAGNOSIS — I2510 Atherosclerotic heart disease of native coronary artery without angina pectoris: Secondary | ICD-10-CM | POA: Diagnosis not present

## 2019-08-22 DIAGNOSIS — N183 Chronic kidney disease, stage 3 unspecified: Secondary | ICD-10-CM

## 2019-08-22 DIAGNOSIS — Z8781 Personal history of (healed) traumatic fracture: Secondary | ICD-10-CM | POA: Diagnosis not present

## 2019-08-22 DIAGNOSIS — Z8719 Personal history of other diseases of the digestive system: Secondary | ICD-10-CM

## 2019-08-22 DIAGNOSIS — R911 Solitary pulmonary nodule: Secondary | ICD-10-CM

## 2019-08-22 DIAGNOSIS — Z96649 Presence of unspecified artificial hip joint: Secondary | ICD-10-CM | POA: Diagnosis not present

## 2019-08-22 DIAGNOSIS — I5032 Chronic diastolic (congestive) heart failure: Secondary | ICD-10-CM

## 2019-08-23 DIAGNOSIS — M7541 Impingement syndrome of right shoulder: Secondary | ICD-10-CM | POA: Diagnosis not present

## 2019-08-25 DIAGNOSIS — R0982 Postnasal drip: Secondary | ICD-10-CM | POA: Diagnosis not present

## 2019-08-25 DIAGNOSIS — R05 Cough: Secondary | ICD-10-CM | POA: Diagnosis not present

## 2019-08-25 LAB — COMPLETE METABOLIC PANEL WITH GFR
AG Ratio: 1.3 (calc) (ref 1.0–2.5)
ALT: 16 U/L (ref 6–29)
AST: 19 U/L (ref 10–35)
Albumin: 3.3 g/dL — ABNORMAL LOW (ref 3.6–5.1)
Alkaline phosphatase (APISO): 102 U/L (ref 37–153)
BUN/Creatinine Ratio: 14 (calc) (ref 6–22)
BUN: 16 mg/dL (ref 7–25)
CO2: 29 mmol/L (ref 20–32)
Calcium: 8.9 mg/dL (ref 8.6–10.4)
Chloride: 101 mmol/L (ref 98–110)
Creat: 1.13 mg/dL — ABNORMAL HIGH (ref 0.60–0.88)
GFR, Est African American: 52 mL/min/{1.73_m2} — ABNORMAL LOW (ref >=60)
GFR, Est Non African American: 45 mL/min/{1.73_m2} — ABNORMAL LOW (ref >=60)
Globulin: 2.5 g/dL (calc) (ref 1.9–3.7)
Glucose, Bld: 83 mg/dL (ref 65–99)
Potassium: 4 mmol/L (ref 3.5–5.3)
Sodium: 137 mmol/L (ref 135–146)
Total Bilirubin: 0.4 mg/dL (ref 0.2–1.2)
Total Protein: 5.8 g/dL — ABNORMAL LOW (ref 6.1–8.1)

## 2019-08-25 LAB — CBC WITH DIFFERENTIAL/PLATELET
Absolute Monocytes: 350 cells/uL (ref 200–950)
Basophils Absolute: 19 cells/uL (ref 0–200)
Basophils Relative: 0.4 %
Eosinophils Absolute: 130 cells/uL (ref 15–500)
Eosinophils Relative: 2.7 %
HCT: 37.3 % (ref 35.0–45.0)
Hemoglobin: 12.1 g/dL (ref 11.7–15.5)
Lymphs Abs: 878 cells/uL (ref 850–3900)
MCH: 29.4 pg (ref 27.0–33.0)
MCHC: 32.4 g/dL (ref 32.0–36.0)
MCV: 90.8 fL (ref 80.0–100.0)
MPV: 10.8 fL (ref 7.5–12.5)
Monocytes Relative: 7.3 %
Neutro Abs: 3422 cells/uL (ref 1500–7800)
Neutrophils Relative %: 71.3 %
Platelets: 252 10*3/uL (ref 140–400)
RBC: 4.11 10*6/uL (ref 3.80–5.10)
RDW: 12.9 % (ref 11.0–15.0)
Total Lymphocyte: 18.3 %
WBC: 4.8 10*3/uL (ref 3.8–10.8)

## 2019-08-25 LAB — BETA-2 GLYCOPROTEIN ANTIBODIES
Beta-2 Glyco 1 IgA: <9 SAU (ref <=20)
Beta-2 Glyco 1 IgM: <9 SMU (ref <=20)
Beta-2 Glyco I IgG: <9 SGU (ref <=20)

## 2019-08-25 LAB — RNP ANTIBODY: Ribonucleic Protein(ENA) Antibody, IgG: <1 AI

## 2019-08-25 LAB — CARDIOLIPIN ANTIBODIES, IGG, IGM, IGA
Anticardiolipin IgA: <11 [APL'U]
Anticardiolipin IgG: <14 [GPL'U]
Anticardiolipin IgM: <12 [MPL'U]

## 2019-08-25 LAB — RFLX DRVVT CONFRIM: DRVVT CONFIRM: NEGATIVE

## 2019-08-25 LAB — URINALYSIS, ROUTINE W REFLEX MICROSCOPIC
Bilirubin Urine: NEGATIVE
Glucose, UA: NEGATIVE
Hgb urine dipstick: NEGATIVE
Ketones, ur: NEGATIVE
Leukocytes,Ua: NEGATIVE
Nitrite: NEGATIVE
Protein, ur: NEGATIVE
Specific Gravity, Urine: 1.01 (ref 1.001–1.03)
pH: <=5 (ref 5.0–8.0)

## 2019-08-25 LAB — ANTI-DNA ANTIBODY, DOUBLE-STRANDED: ds DNA Ab: <1 IU/mL

## 2019-08-25 LAB — C3 AND C4
C3 Complement: 131 mg/dL
C4 Complement: 21 mg/dL

## 2019-08-25 LAB — LUPUS ANTICOAGULANT EVAL W/ REFLEX
PTT-LA Screen: 39 s (ref <=40)
dRVVT: 60 s — ABNORMAL HIGH (ref <=45)

## 2019-08-25 LAB — GLUCOSE 6 PHOSPHATE DEHYDROGENASE: G-6PDH: 13.8 U/g Hgb (ref 7.0–20.5)

## 2019-08-25 LAB — SEDIMENTATION RATE: Sed Rate: 28 mm/h (ref 0–30)

## 2019-08-25 LAB — ANTI-SMITH ANTIBODY: ENA SM Ab Ser-aCnc: <1 AI

## 2019-08-25 NOTE — Progress Notes (Signed)
I will discuss results at the follow-up visit.

## 2019-08-26 NOTE — Progress Notes (Signed)
Office Visit Note  Patient: [name]             Date of Birth: 06-27-1932           MRN: [id number]             PCP: Crist Infante, MD Referring: Crist Infante, MD Visit Date: 09/01/2019 Occupation: @GUAROCC @  Subjective:  Discuss lab work  History of Present Illness: [name] is a 86 y.o. female with history of positive ANA and DDD.  She continues to have chronic dry mouth and dry eyes. She uses systane eydrops as needed for symptomatic relief. She denies any recent rashes, photosensitivity, symptoms of Raynaud's, oral or nasal ulcerations.  She has chronic pain and stiffness in her neck.  She denies any discomfort in the right knee replacement or left knee replacement at this time.  She experiences shortness of breath intermittently due to underlying asthma.  She is followed by Dr. Chase Caller.     Activities of Daily Living:  Patient reports morning stiffness for 0 minutes.   Patient Reports nocturnal pain.  Difficulty dressing/grooming: Denies Difficulty climbing stairs: Reports Difficulty getting out of chair: Reports Difficulty using hands for taps, buttons, cutlery, and/or writing: Reports  Review of Systems  Constitutional: Positive for fatigue.  HENT: Negative for mouth sores, mouth dryness and nose dryness.   Eyes: Positive for dryness. Negative for pain and visual disturbance.  Respiratory: Positive for shortness of breath and difficulty breathing. Negative for cough and hemoptysis.   Cardiovascular: Negative for chest pain, palpitations, hypertension and swelling in legs/feet.  Gastrointestinal: Negative for blood in stool, constipation and diarrhea.  Endocrine: Negative for increased urination.  Genitourinary: Negative for difficulty urinating and painful urination.  Musculoskeletal: Positive for arthralgias, joint pain and joint swelling. Negative for myalgias, muscle weakness, morning stiffness, muscle tenderness and myalgias.  Skin: Negative for color  change, pallor, rash, hair loss, nodules/bumps, redness, skin tightness, ulcers and sensitivity to sunlight.  Allergic/Immunologic: Negative for susceptible to infections.  Neurological: Negative for dizziness, numbness, headaches, memory loss and weakness.  Hematological: Negative for swollen glands.  Psychiatric/Behavioral: Negative for depressed mood, confusion and sleep disturbance. The patient is not nervous/anxious.     PMFS History:  Patient Active Problem List   Diagnosis Date Noted  . Chest pain 12/22/2018  . Displaced fracture of greater tuberosity of right humerus, initial encounter for closed fracture 09/21/2018  . Closed fracture of right proximal humerus 09/21/2018  . S/P left TH revision 12/03/2017  . Essential hypertension 11/17/2017  . Chronic diastolic CHF (congestive heart failure) ([place]) 03/19/2017  . CAD (coronary artery disease), native coronary artery 12/20/2016  . Closed left hip fracture ([place]) 12/19/2016  . CKD (chronic kidney disease) stage 3, GFR 30-59 ml/min 12/19/2016  . History of DVT of lower extremity   . Lung nodule 08/03/2014  . Peripheral vascular disease, unspecified ([place]) 09/12/2013  . Hyperlipidemia 03/03/2013  . Expected blood loss anemia 10/06/2012  . Overweight (BMI 25.0-29.9) 10/06/2012  . DJD (degenerative joint disease) 05/26/2012  . Severe persistent asthma 08/05/2007  . Hypertensive heart disease without CHF 04/30/2007  . GERD 01/13/2007    Past Medical History:  Diagnosis Date  . Anemia    after hysterectomy  . Asthma, severe persistent    pulmologist-- dr Lynford Citizen  . Cancer [facility])    Breast cancer on right  . Chronic kidney disease, stage II (mild)   . DDD (degenerative disc disease), cervical   . Diverticulitis   . Diverticulosis  yrs ago   hx of   . Edema, lower extremity    occ both legs swell  . GERD (gastroesophageal reflux disease)   . Headache    sinus headaches  . Heart murmur    no problems - per pt  . History of  breast cancer 1990 left mastectomy also   1989  S/P   RIGHT MASTECTOMY ;  NO CHEMORADIATION //   NO RECURRENCE  . History of DVT of lower extremity 5 yrs ago   right leg  . History of shingles   . Hyperlipidemia   . Internal hemorrhoid   . Leg ulcer, left ([place])   . Osteoporosis, unspecified    Knee and hip osteoarthritis bilaterally  . Perennial allergic rhinitis   . Peripheral vascular disease ([place])   . Pneumonia   . Restless legs syndrome (RLS)   . Rheumatoid arthritis ([place])    hands  . Thyroid nodule    followed by dr perrini yearly, no current problam  . Unspecified essential hypertension     Family History  Problem Relation Age of Onset  . Heart attack Mother   . Asthma Mother   . Heart disease Mother   . Hyperlipidemia Mother   . Heart attack Father   . Heart failure Father   . Deep vein thrombosis Father   . Heart disease Father   . Peripheral vascular disease Father        amputation  . Cancer Brother   . Asthma Brother   . Coronary artery disease Sister   . Heart disease Sister    Past Surgical History:  Procedure Laterality Date  . CARDIAC CATHETERIZATION  08/ 01/ 2008   dr Cathie Olden   normal -- EF of 65%  . CARDIOVASCULAR STRESS TEST  05-22-2011   dr Cathie Olden   normal lexiscan perfusion study/  no ischemia/  lvsf  86%  . CATARACT EXTRACTION W/ INTRAOCULAR LENS  IMPLANT, BILATERAL    . CHOLECYSTECTOMY  1993   laparoscopic  . CONVERSION TO TOTAL HIP Left 12/03/2017   Procedure: CONVERSION TO LEFT TOTAL HIP;  Surgeon: Paralee Cancel, MD;  Location: [facility];  Service: Orthopedics;  Laterality: Left;  90 mins  . HIP ARTHROPLASTY Left 12/22/2016   Procedure: HEMIARTHROPLASTY, LEFT;  Surgeon: Paralee Cancel, MD;  Location: [facility];  Service: Orthopedics;  Laterality: Left;  . INCISION AND DRAINAGE OF WOUND Left 10/24/2013   Procedure: IRRIGATION AND DEBRIDEMENT OF LEFT LEG WITH PLACEMENT OF ACELL AND WOUND VAC;  Surgeon: Theodoro Kos, DO;  Location: [place];  Service: Plastics;  Laterality: Left;  . KNEE ARTHROPLASTY    . [place]  . MASTECTOMY Bilateral rigth 1989///   left  1990   breast cancer 1989//   fibrocytic disease 1990  . ORIF HUMERUS FRACTURE Right 09/21/2018   Procedure: OPEN REDUCTION INTERNAL FIXATION (ORIF) PROXIMAL HUMERUS FRACTURE;  Surgeon: Nicholes Stairs, MD;  Location: [place] [facility];  Service: Orthopedics;  Laterality: Right;  90 mins  . TOTAL ABDOMINAL HYSTERECTOMY W/ BILATERAL SALPINGOOPHORECTOMY  1989   done at same time as right mastectomy  . TOTAL KNEE ARTHROPLASTY Right 10/05/2012   Procedure: RIGHT TOTAL KNEE ARTHROPLASTY;  Surgeon: Mauri Pole, MD;  Location: [facility];  Service: Orthopedics;  Laterality: Right;   Social History   Social History Narrative   Originally from [place]. Always lived in [place]. Has traveled across the country to [place]. 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 No known asbestos exposure (brother with mesothelioma). No pets currently. No bird exposure. No mold exposure. Carpet has been removed from her home.    Immunization History  Administered Date(s) Administered  . Pneumococcal Conjugate-13 09/14/2012  . Pneumococcal Polysaccharide-23 01/31/2009  . Tdap 09/07/2018  . Zoster Recombinat (Shingrix) 04/18/2017, 09/16/2017     Objective: Vital Signs: BP (!) 143/80 (BP Location: Left Arm, Patient Position: Sitting, Cuff Size: Normal)   Pulse 80   Resp 15   Ht 5' 4.5" (1.638 m)   Wt 143 lb 9.6 oz (65.1 kg)   BMI 24.27 kg/m    Physical Exam Vitals and nursing note reviewed.  Constitutional:      Appearance: She is well-developed.  HENT:     Head: Normocephalic and atraumatic.  Eyes:     Conjunctiva/sclera: Conjunctivae normal.  Pulmonary:     Effort: Pulmonary effort is normal.  Abdominal:     General: Bowel sounds are normal.     Palpations: Abdomen is soft.  Musculoskeletal:     Cervical back: Normal range of  motion.  Lymphadenopathy:     Cervical: No cervical adenopathy.  Skin:    General: Skin is warm and dry.     Capillary Refill: Capillary refill takes less than 2 seconds.  Neurological:     Mental Status: She is alert and oriented to person, place, and time.  Psychiatric:        Behavior: Behavior normal.      Musculoskeletal Exam: C-spine limited ROM with lateral rotation.  Right shoulder abduction to about 30 degrees.  PIP and DIP thickening consistent with osteoarthritis of both hands.  CMC joint thickening bilaterally.  Left hip replacement has good ROM with no discomfort.  Right hip has good ROM with no discomfort.  Right knee replacement has good ROM with no warmth or effusion.  Left knee has good ROM with no warmth or effusion.  Ankle joints good ROM with no tenderness or swelling.    CDAI Exam: CDAI Score: -- Patient Global: --; Provider Global: -- Swollen: --; Tender: -- Joint Exam 09/01/2019   No joint exam has been documented for this visit   There is currently no information documented on the homunculus. Go to the Rheumatology activity and complete the homunculus joint exam.  Investigation: No additional findings.  Imaging: No results found.  Recent Labs: Lab Results  Component Value Date   WBC 4.8 08/22/2019   HGB 12.1 08/22/2019   PLT 252 08/22/2019   NA 137 08/22/2019   K 4.0 08/22/2019   CL 101 08/22/2019   CO2 29 08/22/2019   GLUCOSE 83 08/22/2019   BUN 16 08/22/2019   CREATININE 1.13 (H) 08/22/2019   BILITOT 0.4 08/22/2019   ALKPHOS 84 03/19/2017   AST 19 08/22/2019   ALT 16 08/22/2019   PROT 5.8 (L) 08/22/2019   ALBUMIN 4.0 03/19/2017   CALCIUM 8.9 08/22/2019   GFRAA 52 (L) 08/22/2019    Speciality Comments: No specialty comments available.  Procedures:  No procedures performed Allergies: Eggs or egg-derived products, Influenza vaccines, Nucala [mepolizumab], Cephalosporins, Codeine, Gabapentin, Levofloxacin, and Pneumococcal vaccines    Assessment / Plan:     Visit Diagnoses:Sjogren's syndrome with other organ involvement [facility]): ANA 1:1280 cytoplasmic, Ro+, sicca symptoms: We discussed lab work from 02/28/2019 and 08/22/2019 today in the office and all questions were addressed.  We discussed the association with SS-A antibody and arrhythmias.  According to the patient  she has had a baseline EKG.  She has not had any palpitations recently.  She continues to have chronic sicca symptoms.  She has been using Systane eyedrops as needed for symptomatic relief.  We discussed the use of over-the-counter products and purchasing a humidifier for home.  She has no other clinical features of autoimmune disease at this time.  She does not require immunosuppressive agents at this time.  She would not be a good candidate for pilocarpine due to her history of severe persistent asthma.  She was advised to notify us if she develops any new or worsening symptoms.  She will follow-up in the office in 6 months.   Positive ANA (antinuclear antibody) - Positive ANA 1: 1280 cytoplasmic, Ro >8, mild sicca symptoms.  She has no other clinical features of autoimmune disease at this time.  She will notify us if she develops any new or worsening symptoms.  S/P left TH revision - Dr. Alvan Dame: Doing well.  She is good range of motion with no discomfort at this time.  Status post total knee replacement, right - Dr. Alvan Dame: Doing well.  Has good range of motion with no discomfort.  No warmth or effusion was noted.  History of humerus fracture - 09/07/18: She continues to have extremely limited range of motion with abduction to about 30 degrees.  DDD (degenerative disc disease), lumbar: She experiences intermittent lower back pain.  She has no symptoms of radiculopathy at this time.  Other medical conditions are listed as follows:   Lung nodule  Severe persistent asthma without complication  Essential hypertension  Chronic diastolic CHF (congestive heart failure)  (HCC)  Coronary artery disease involving native coronary artery of native heart without angina pectoris  History of DVT of lower extremity  History of hyperlipidemia  History of gastroesophageal reflux (GERD)  Stage 3 chronic kidney disease, unspecified whether stage 3a or 3b CKD  Hypertensive heart disease without CHF  Orders: No orders of the defined types were placed in this encounter.  No orders of the defined types were placed in this encounter.     Follow-Up Instructions: Return in about 6 months (around 03/02/2020) for Positive ANA, sicca symptoms .   Ofilia Neas, PA-C  Note - This record has been created using Dragon software.  Chart creation errors have been sought, but may not always  have been located. Such creation errors do not reflect on  the standard of medical care.

## 2019-09-01 ENCOUNTER — Ambulatory Visit (INDEPENDENT_AMBULATORY_CARE_PROVIDER_SITE_OTHER): Payer: Medicare Other

## 2019-09-01 ENCOUNTER — Ambulatory Visit (INDEPENDENT_AMBULATORY_CARE_PROVIDER_SITE_OTHER): Payer: Medicare Other | Admitting: Physician Assistant

## 2019-09-01 ENCOUNTER — Other Ambulatory Visit: Payer: Self-pay

## 2019-09-01 ENCOUNTER — Encounter: Payer: Self-pay | Admitting: Physician Assistant

## 2019-09-01 VITALS — BP 143/80 | HR 80 | Resp 15 | Ht 64.5 in | Wt 143.6 lb

## 2019-09-01 DIAGNOSIS — Z8781 Personal history of (healed) traumatic fracture: Secondary | ICD-10-CM | POA: Diagnosis not present

## 2019-09-01 DIAGNOSIS — Z86718 Personal history of other venous thrombosis and embolism: Secondary | ICD-10-CM

## 2019-09-01 DIAGNOSIS — J4550 Severe persistent asthma, uncomplicated: Secondary | ICD-10-CM | POA: Diagnosis not present

## 2019-09-01 DIAGNOSIS — M3509 Sicca syndrome with other organ involvement: Secondary | ICD-10-CM | POA: Diagnosis not present

## 2019-09-01 DIAGNOSIS — I10 Essential (primary) hypertension: Secondary | ICD-10-CM

## 2019-09-01 DIAGNOSIS — Z8719 Personal history of other diseases of the digestive system: Secondary | ICD-10-CM

## 2019-09-01 DIAGNOSIS — I5032 Chronic diastolic (congestive) heart failure: Secondary | ICD-10-CM | POA: Diagnosis not present

## 2019-09-01 DIAGNOSIS — R768 Other specified abnormal immunological findings in serum: Secondary | ICD-10-CM | POA: Diagnosis not present

## 2019-09-01 DIAGNOSIS — M5136 Other intervertebral disc degeneration, lumbar region: Secondary | ICD-10-CM | POA: Diagnosis not present

## 2019-09-01 DIAGNOSIS — N183 Chronic kidney disease, stage 3 unspecified: Secondary | ICD-10-CM

## 2019-09-01 DIAGNOSIS — R911 Solitary pulmonary nodule: Secondary | ICD-10-CM | POA: Diagnosis not present

## 2019-09-01 DIAGNOSIS — I2510 Atherosclerotic heart disease of native coronary artery without angina pectoris: Secondary | ICD-10-CM | POA: Diagnosis not present

## 2019-09-01 DIAGNOSIS — Z96651 Presence of right artificial knee joint: Secondary | ICD-10-CM

## 2019-09-01 DIAGNOSIS — Z96649 Presence of unspecified artificial hip joint: Secondary | ICD-10-CM | POA: Diagnosis not present

## 2019-09-01 DIAGNOSIS — Z8639 Personal history of other endocrine, nutritional and metabolic disease: Secondary | ICD-10-CM

## 2019-09-01 DIAGNOSIS — I119 Hypertensive heart disease without heart failure: Secondary | ICD-10-CM

## 2019-09-01 MED ORDER — DUPILUMAB 300 MG/2ML ~~LOC~~ SOSY
300.0000 mg | PREFILLED_SYRINGE | Freq: Once | SUBCUTANEOUS | Status: AC
  Administered 2019-09-01: 11:00:00 300 mg via SUBCUTANEOUS

## 2019-09-01 NOTE — Progress Notes (Signed)
All questions were answered by the patient before medication was administered. Have you been hospitalized in the last 10 days? No Do you have a fever? No Do you have a cough? No Do you have a headache or sore throat? No  

## 2019-09-02 DIAGNOSIS — M810 Age-related osteoporosis without current pathological fracture: Secondary | ICD-10-CM | POA: Diagnosis not present

## 2019-09-02 DIAGNOSIS — D692 Other nonthrombocytopenic purpura: Secondary | ICD-10-CM | POA: Diagnosis not present

## 2019-09-02 DIAGNOSIS — R7301 Impaired fasting glucose: Secondary | ICD-10-CM | POA: Diagnosis not present

## 2019-09-02 DIAGNOSIS — N1831 Chronic kidney disease, stage 3a: Secondary | ICD-10-CM | POA: Diagnosis not present

## 2019-09-02 DIAGNOSIS — Z853 Personal history of malignant neoplasm of breast: Secondary | ICD-10-CM | POA: Diagnosis not present

## 2019-09-02 DIAGNOSIS — M25552 Pain in left hip: Secondary | ICD-10-CM | POA: Diagnosis not present

## 2019-09-02 DIAGNOSIS — I700 Atherosclerosis of aorta: Secondary | ICD-10-CM | POA: Diagnosis not present

## 2019-09-02 DIAGNOSIS — I2510 Atherosclerotic heart disease of native coronary artery without angina pectoris: Secondary | ICD-10-CM | POA: Diagnosis not present

## 2019-09-06 ENCOUNTER — Telehealth: Payer: Self-pay | Admitting: Internal Medicine

## 2019-09-07 NOTE — Telephone Encounter (Signed)
Dupixent Shipment Received: 300mg  #2 prefilled syringe Medication arrival date: 09/07/19 Lot #: [id number] Exp date: 12/08/2021 Received by: Elliot Dally

## 2019-09-15 ENCOUNTER — Ambulatory Visit (INDEPENDENT_AMBULATORY_CARE_PROVIDER_SITE_OTHER): Payer: Medicare Other

## 2019-09-15 ENCOUNTER — Other Ambulatory Visit: Payer: Self-pay

## 2019-09-15 DIAGNOSIS — J4550 Severe persistent asthma, uncomplicated: Secondary | ICD-10-CM

## 2019-09-15 MED ORDER — DUPILUMAB 300 MG/2ML ~~LOC~~ SOSY
300.0000 mg | PREFILLED_SYRINGE | Freq: Once | SUBCUTANEOUS | Status: AC
  Administered 2019-09-15: 300 mg via SUBCUTANEOUS

## 2019-09-15 NOTE — Progress Notes (Signed)
All questions were answered by the patient before medication was administered. Have you been hospitalized in the last 10 days? No Do you have a fever? No Do you have a cough? No Do you have a headache or sore throat? No  

## 2019-10-03 ENCOUNTER — Ambulatory Visit (INDEPENDENT_AMBULATORY_CARE_PROVIDER_SITE_OTHER): Payer: Medicare Other

## 2019-10-03 ENCOUNTER — Other Ambulatory Visit: Payer: Self-pay

## 2019-10-03 DIAGNOSIS — J4550 Severe persistent asthma, uncomplicated: Secondary | ICD-10-CM | POA: Diagnosis not present

## 2019-10-03 MED ORDER — DUPILUMAB 300 MG/2ML ~~LOC~~ SOSY
300.0000 mg | PREFILLED_SYRINGE | Freq: Once | SUBCUTANEOUS | Status: AC
  Administered 2019-10-03: 300 mg via SUBCUTANEOUS

## 2019-10-03 NOTE — Progress Notes (Signed)
All questions were answered by the patient before medication was administered. Have you been hospitalized in the last 10 days? No Do you have a fever? No Do you have a cough? No Do you have a headache or sore throat? No  

## 2019-10-04 DIAGNOSIS — S42201A Unspecified fracture of upper end of right humerus, initial encounter for closed fracture: Secondary | ICD-10-CM | POA: Diagnosis not present

## 2019-10-04 DIAGNOSIS — M79645 Pain in left finger(s): Secondary | ICD-10-CM | POA: Diagnosis not present

## 2019-10-06 ENCOUNTER — Other Ambulatory Visit: Payer: Self-pay | Admitting: Internal Medicine

## 2019-10-06 ENCOUNTER — Telehealth: Payer: Self-pay | Admitting: Internal Medicine

## 2019-10-12 NOTE — Telephone Encounter (Signed)
Dupixent Order: 300mg  #2 prefilled syringe Ordered Date: 10/12/2019 Expected date of arrival: 10/13/2019 Ordered by: Desmond Dike, [place]  Specialty Pharmacy: [facility]

## 2019-10-13 NOTE — Telephone Encounter (Signed)
Dupixent Shipment Received: 300mg  #2 prefilled syringe Medication arrival date: 10/13/2019 Lot #: [id number] Exp date: 12/2021 Received by: Desmond Dike, [place]

## 2019-10-17 ENCOUNTER — Other Ambulatory Visit: Payer: Self-pay

## 2019-10-17 ENCOUNTER — Ambulatory Visit (INDEPENDENT_AMBULATORY_CARE_PROVIDER_SITE_OTHER): Payer: Medicare Other

## 2019-10-17 DIAGNOSIS — J4550 Severe persistent asthma, uncomplicated: Secondary | ICD-10-CM

## 2019-10-17 MED ORDER — DUPILUMAB 300 MG/2ML ~~LOC~~ SOSY
300.0000 mg | PREFILLED_SYRINGE | Freq: Once | SUBCUTANEOUS | Status: AC
Start: 2019-10-17 — End: 2019-10-17
  Administered 2019-10-17: 300 mg via SUBCUTANEOUS

## 2019-10-17 NOTE — Progress Notes (Signed)
All questions were answered by the patient before medication was administered. Have you been hospitalized in the last 10 days? No Do you have a fever? No Do you have a cough? No Do you have a headache or sore throat? No  

## 2019-10-25 DIAGNOSIS — H353131 Nonexudative age-related macular degeneration, bilateral, early dry stage: Secondary | ICD-10-CM | POA: Diagnosis not present

## 2019-10-25 DIAGNOSIS — Z961 Presence of intraocular lens: Secondary | ICD-10-CM | POA: Diagnosis not present

## 2019-10-25 DIAGNOSIS — H0102A Squamous blepharitis right eye, upper and lower eyelids: Secondary | ICD-10-CM | POA: Diagnosis not present

## 2019-10-25 DIAGNOSIS — E119 Type 2 diabetes mellitus without complications: Secondary | ICD-10-CM | POA: Diagnosis not present

## 2019-10-25 DIAGNOSIS — H40013 Open angle with borderline findings, low risk, bilateral: Secondary | ICD-10-CM | POA: Diagnosis not present

## 2019-10-25 DIAGNOSIS — H02834 Dermatochalasis of left upper eyelid: Secondary | ICD-10-CM | POA: Diagnosis not present

## 2019-10-25 DIAGNOSIS — H04123 Dry eye syndrome of bilateral lacrimal glands: Secondary | ICD-10-CM | POA: Diagnosis not present

## 2019-10-25 DIAGNOSIS — H02831 Dermatochalasis of right upper eyelid: Secondary | ICD-10-CM | POA: Diagnosis not present

## 2019-10-25 DIAGNOSIS — H0102B Squamous blepharitis left eye, upper and lower eyelids: Secondary | ICD-10-CM | POA: Diagnosis not present

## 2019-10-31 ENCOUNTER — Other Ambulatory Visit: Payer: Self-pay

## 2019-10-31 ENCOUNTER — Ambulatory Visit (INDEPENDENT_AMBULATORY_CARE_PROVIDER_SITE_OTHER): Payer: Medicare Other

## 2019-10-31 DIAGNOSIS — J4550 Severe persistent asthma, uncomplicated: Secondary | ICD-10-CM

## 2019-10-31 MED ORDER — DUPILUMAB 300 MG/2ML ~~LOC~~ SOSY
300.0000 mg | PREFILLED_SYRINGE | Freq: Once | SUBCUTANEOUS | Status: AC
  Administered 2019-10-31: 300 mg via SUBCUTANEOUS

## 2019-10-31 NOTE — Progress Notes (Signed)
Have you been hospitalized within the last 10 days?  No Do you have a fever?  No Do you have a cough?  No Do you have a headache or sore throat? No Do you have your Epi Pen visible and is it within date?  Yes 

## 2019-11-15 ENCOUNTER — Other Ambulatory Visit: Payer: Self-pay

## 2019-11-15 ENCOUNTER — Ambulatory Visit (INDEPENDENT_AMBULATORY_CARE_PROVIDER_SITE_OTHER): Payer: Medicare Other

## 2019-11-15 DIAGNOSIS — J4550 Severe persistent asthma, uncomplicated: Secondary | ICD-10-CM

## 2019-11-15 MED ORDER — DUPILUMAB 300 MG/2ML ~~LOC~~ SOSY
300.0000 mg | PREFILLED_SYRINGE | Freq: Once | SUBCUTANEOUS | Status: AC
  Administered 2019-11-15: 300 mg via SUBCUTANEOUS

## 2019-11-15 NOTE — Progress Notes (Signed)
Have you been hospitalized within the last 10 days?  No Do you have a fever?  No Do you have a cough?  No Do you have a headache or sore throat? No Do you have your Epi Pen visible and is it within date?  Yes 

## 2019-11-22 ENCOUNTER — Other Ambulatory Visit: Payer: Self-pay | Admitting: Internal Medicine

## 2019-11-28 ENCOUNTER — Telehealth: Payer: Self-pay | Admitting: Internal Medicine

## 2019-11-28 NOTE — Telephone Encounter (Signed)
Called and spoke with Patient.  Patient scheduled 11/29/19 at 1545 for Dupixent injection.  Nothing further at this time.

## 2019-11-29 ENCOUNTER — Other Ambulatory Visit: Payer: Self-pay

## 2019-11-29 ENCOUNTER — Ambulatory Visit: Payer: Medicare Other

## 2019-11-29 ENCOUNTER — Ambulatory Visit (INDEPENDENT_AMBULATORY_CARE_PROVIDER_SITE_OTHER): Payer: Medicare Other

## 2019-11-29 DIAGNOSIS — J4550 Severe persistent asthma, uncomplicated: Secondary | ICD-10-CM

## 2019-11-29 MED ORDER — DUPILUMAB 300 MG/2ML ~~LOC~~ SOSY
300.0000 mg | PREFILLED_SYRINGE | Freq: Once | SUBCUTANEOUS | Status: AC
  Administered 2019-11-29: 300 mg via SUBCUTANEOUS

## 2019-11-29 NOTE — Progress Notes (Signed)
Have you been hospitalized within the last 10 days?  No Do you have a fever?  No Do you have a cough?  No Do you have a headache or sore throat? No Do you have your Epi Pen visible and is it within date?  Yes 

## 2019-11-30 ENCOUNTER — Telehealth: Payer: Self-pay | Admitting: Internal Medicine

## 2019-12-02 NOTE — Telephone Encounter (Signed)
Dupixent Shipment Received: 300mg  #2 prefilled syringe Medication arrival date: 12/02/19 Lot #: [id number] Exp date: 03/11/2022 Received by: Elliot Dally

## 2019-12-13 ENCOUNTER — Ambulatory Visit (INDEPENDENT_AMBULATORY_CARE_PROVIDER_SITE_OTHER): Payer: Medicare Other

## 2019-12-13 ENCOUNTER — Other Ambulatory Visit: Payer: Self-pay

## 2019-12-13 DIAGNOSIS — J4550 Severe persistent asthma, uncomplicated: Secondary | ICD-10-CM | POA: Diagnosis not present

## 2019-12-13 MED ORDER — DUPILUMAB 300 MG/2ML ~~LOC~~ SOSY
300.0000 mg | PREFILLED_SYRINGE | Freq: Once | SUBCUTANEOUS | Status: AC
  Administered 2019-12-13: 300 mg via SUBCUTANEOUS

## 2019-12-13 NOTE — Progress Notes (Signed)
All questions were answered by the patient before medication was administered. Have you been hospitalized in the last 10 days? No Do you have a fever? No Do you have a cough? No Do you have a headache or sore throat? No  

## 2019-12-15 ENCOUNTER — Other Ambulatory Visit: Payer: Self-pay | Admitting: Internal Medicine

## 2019-12-27 ENCOUNTER — Ambulatory Visit (INDEPENDENT_AMBULATORY_CARE_PROVIDER_SITE_OTHER): Payer: Medicare Other

## 2019-12-27 ENCOUNTER — Other Ambulatory Visit: Payer: Self-pay

## 2019-12-27 DIAGNOSIS — J4550 Severe persistent asthma, uncomplicated: Secondary | ICD-10-CM

## 2019-12-27 MED ORDER — DUPILUMAB 300 MG/2ML ~~LOC~~ SOSY
300.0000 mg | PREFILLED_SYRINGE | Freq: Once | SUBCUTANEOUS | Status: AC
  Administered 2019-12-27: 300 mg via SUBCUTANEOUS

## 2019-12-27 NOTE — Progress Notes (Signed)
Have you been hospitalized within the last 10 days?  No Do you have a fever?  No Do you have a cough?  No Do you have a headache or sore throat? No Do you have your Epi Pen visible and is it within date?  Yes 

## 2019-12-28 ENCOUNTER — Other Ambulatory Visit: Payer: Self-pay | Admitting: Internal Medicine

## 2019-12-30 NOTE — Telephone Encounter (Signed)
[facility] calling for a refill on Dupixent. [facility] can be reached  [phone]

## 2020-01-03 ENCOUNTER — Telehealth: Payer: Self-pay | Admitting: Internal Medicine

## 2020-01-03 NOTE — Telephone Encounter (Signed)
Dupixent Shipment Received: 300mg  #2 prefilled syringe Medication arrival date: 01/03/20 Lot #: [id number] Exp date: 04/10/2022 Received by: Elliot Dally

## 2020-01-10 ENCOUNTER — Ambulatory Visit (INDEPENDENT_AMBULATORY_CARE_PROVIDER_SITE_OTHER): Payer: Medicare Other | Admitting: Internal Medicine

## 2020-01-10 ENCOUNTER — Ambulatory Visit: Payer: Medicare Other

## 2020-01-10 ENCOUNTER — Other Ambulatory Visit: Payer: Self-pay

## 2020-01-10 ENCOUNTER — Encounter: Payer: Self-pay | Admitting: Internal Medicine

## 2020-01-10 VITALS — BP 130/70 | HR 77 | Temp 97.3°F | Ht 63.0 in | Wt 148.1 lb

## 2020-01-10 DIAGNOSIS — I2510 Atherosclerotic heart disease of native coronary artery without angina pectoris: Secondary | ICD-10-CM | POA: Diagnosis not present

## 2020-01-10 DIAGNOSIS — Z7185 Encounter for immunization safety counseling: Secondary | ICD-10-CM

## 2020-01-10 DIAGNOSIS — J4550 Severe persistent asthma, uncomplicated: Secondary | ICD-10-CM

## 2020-01-10 DIAGNOSIS — Z7189 Other specified counseling: Secondary | ICD-10-CM

## 2020-01-10 MED ORDER — DUPILUMAB 300 MG/2ML ~~LOC~~ SOSY
300.0000 mg | PREFILLED_SYRINGE | Freq: Once | SUBCUTANEOUS | Status: AC
Start: 2020-01-10 — End: 2020-01-10
  Administered 2020-01-10: 300 mg via SUBCUTANEOUS

## 2020-01-10 NOTE — Patient Instructions (Addendum)
Severe persistent asthma without complication  -under control   Plan  - continue dupixent,  and singulair and Symbicort  -Respect desire to have Dupixent in the office as opposed to your home -Continue Symbicort  160/4.5 strength but you can try reduce dosing of 1 puff twice daily -Continue albuterol as needed -Please visit with the primary care physician again about taking Covid vaccine.  -Egg allergy might not apply to Covid vaccine  Follow-up 6-9 months or sooner if needed: ACT Test and if possible nitric oxide testing at follow-up

## 2020-01-10 NOTE — Progress Notes (Signed)
Patient ID: [name], female   DOB: March 06, 1933, 86 y.o.   MRN: [id number]         C.C.:  Follow-up for Severe, Persistent Asthma, Chronic Allergic Rhinitis, DVT, & GERD w/ Hiatal Hernia.  HPI Patient has been seen twice in our office since her last appointment with me. Last visit was on 9/26 with nurse practitioner. Patient was treated for an exacerbation of her asthma with bronchitis. She was given a prednisone taper, instructed to use Mucinex twice daily as needed, and a 7 day course of Augmentin. Since her last appointment she fractured her left hip. She is continuing to walk with a cane. She reports no adverse effects from her Xolair. She does feel the Xolair is helping her significantly. Hasn't needed her rescue inhaler significantly in the last few weeks. Only rare nocturnal awakenings with any dyspnea.   Severe, persistent asthma: Patient currently maintained on a regimen of Xolair, Singulair, Spiriva, and Symbicort. She feels her dyspnea has significantly improved. She still has a scratchy throat & hoarse voice. She has her baseline nonproductive cough. No wheezing.  Chronic allergic rhinitis: Previously referred to ENT. Regimen at last appointment with me included Allegra, Singulair, Nasonex, and Xolair. Patient has had multiple different antibiotic courses for recurrent sinusitis. Only having intermittent sinus congestion & drainage.   DVT: Present in bilateral lower extremities. Patient on systemic anticoagulation with Xarelto indefinitely. No melena or hematochezia. No hematuria.   GERD with hiatal hernia: Previously prescribed Nexium. No reflux or dyspepsia. No morning brash water taste.   Review of Systems  No chest pain or tightness. No fever or chills. No abdominal pain or nausea.    OV 06/15/2017  Chief Complaint  Patient presents with  . Follow-up    flare ups with asthma when the weather changes.  Right now it is a cough, with congestions and wheezing. Non-productive  dry cough. SOB with any activity.  Hoarseness.   86 year old female.  Transfer of care from Dr. Ashok Cordia who is left the practice.  Moderate persistent asthma with an elevated IgE.  She is here with her husband.  She tells me that in 2015-2017 she was started on Xolair and then subsequently switched in [place] last year or year before last.  This then caused nonspecific side effect such as erythema in her face and nonspecific pain that reminded her of shingles.  Therefore she discontinued this and the side effects resolved.  She did have 6 doses of the interleukin-5 receptor antibody.  She went back on Xolair.  Overall she feels that since starting Xolair her quality of life and asthma exacerbations have improved but nevertheless she still gets asthma exacerbations every few months and has to go on prednisone.  Currently she states that for the last few to several days she has had increased cough although no change in sputum.  She is also wheezing a little bit more than usual.  Asthma control questionnaire shows for the last week she is waking up a few times at night.  When she wakes up she has moderate symptoms.  Her activities are slightly limited because of asthma.  She is moderate amount of shortness of breath because of asthma and she is wheezing a little of the time and using albuterol for rescue at least 1 or 2 times daily.  Exam nitric oxide is significantly elevated at 125 ppb.  She think she will benefit from prednisone  She she reports to me for the first time a new issue  of left anterior cervical lymph node/submandibular lymph node.  She says it is been present for a while but other physicians were examined it have not felt it.  She asked me to examine it.  She feels it is slowly growing.  Insidious onset for a year.   OV 08/13/2017  Chief Complaint  Patient presents with  . Follow-up    Pt states she has had many flare-ups with her asthma. States she has had a lot of sinus drainage and congestion  and a lot of chest tightness.    86 year old female with complicated asthma   poor control of asthma continues.  At my last visit she did a prednisone taper.  Then a few weeks ago her sinuses acted up and therefore her asthma acted up and she did another prednisone taper on herself at home but it was just for a few days.  She continues on Spiriva, Symbicort, Singulair, Nexium and Xolair injections.  She feels that ever since Dr. Asencion Noble retired her asthma has been out of control.  Currently for the last few weeks her sinuses have been acting up and she wants antibiotics.  In particular she wants Augmentin.  She says in the past this was deemed as an allergy but she went and saw some doctor in [place] and it looks like she has been desensitized.  Penicillin is no longer listed as an allergy.  She insists that she wants Augmentin.  Review of the chart shows she has had chronic sinusitis and a CT scan of the sinus 1 year ago in February 2018.  She says she has seen ENT for this.  No surgery has been recommended.  Just nasal hygiene and antibiotic courses.  Currently her symptoms are quite significant with asthma control question of greater than 2 it appears this might all just be her baseline.  Her exam nitric oxide is still high over 100.  At night she is waking up several times.  When she wakes up she has moderate symptoms.  Activities are slightly limited because of asthma.  She has moderate amount of shortness of breath because of asthma.  She is wheezing hardly and she uses albuterol for rescue at least 1 or 2 times daily.   OV  Chief Complaint  Patient presents with  . Follow-up    Pt states her breathing has been much better since she began new injection but states she is coughing more than before. Denies any CP.    Follow-up severe persistent asthma with eosinophilia: In April 2019 we started dupulimab and stop the Xolair. With this asthma control is significantly better. Her asthma  control questionnaire average score has drop to 0.6. Her exhaled nitric oxide is drop from 100s to 43. She says she is much less short of breath and is able to do a lot more things. She is very pleased with the new injectable. However she feels that her baseline chronic cough is slightly worse. It is moderate in intensity is dry. It does not wake up at night. It is not associated with wheezing or shortness of breath. It is associated with Spiriva MDI. She does clear her throat. Coughing is made worse by talking. Off note, she has new issue of left hip surgery coming up in 2 days. She feels from a pulmonary standpoint she will be stable to do the surgery because of prior experience with surgery.        OV 03/08/2018  Subjective:  Patient ID: [name],  female , DOB: Jul 02, 1932 , age 78 y.o. , MRN: [id number] , ADDRESS: [address] [place] [id number]   03/08/2018 -   Chief Complaint  Patient presents with  . Follow-up    Sinus draniage and more cough    HPI Mardi Cullimore 86 y.o. -returns for asthma follow-up.  However the issue today is that she reports worsening of sinus drainage for the last 1 month ever since the fall weather change.  He says the sinus drainage is so significant that it is making her cough.  She feels asthma itself is under control.  In fact exam nitric oxide today is 38.  She is continuing inhalers Singulair Nexium and injection dupilumab.  She refused flu shot because she is allergic to it.  Review of the chart indicates March 2018 Dr. Ashok Cordia did a CT scan of the sinus and it showed pansinusitis.  She is says she is seen ENT from 2 years ago and is unclear if it was before the scan or after the scan.  At this point in time she just wants relief from her current sinus issue.  The sinus drainage is possibly clear but she has not looked at it.  There is no fever worsening wheeze or chest tightness.  05/17/2018 Patient presents today with acute complaints of cough  and congestion. Accompanied by husband. Productive cough with clear mucus since 05/05/18. Husband and brother were both sick. She was seen at [facility] on 12/29 and given steroid injection and amoxicillin course. Finished antibiotic 2 days ago. Did not notice much of an improvement, if any. She has taken mucinex and delsym for cough. Hasn't had to use rescue inhaler or nebulizer. Due for Dupixent injection on 05/25/18. Did not receive flu injection d/t allergy. Afebrile.   05/25/2018 Patient presents for 1 week follow-up for asthma exacerbation. CXR showed no acute cardiopulmonary disease. Treated with prednisone taper. Due for dupixent injection today. Feeling better than she did last week, still has a cough but sputum has cleared. Felt better yesterday, rain made her symptoms worse. States anytime the weather changes her asthma flares. Using nebulizer as needed, didn't use it as much yesterday. Required a treatment this morning. Takes Singulair and allegra. Afebrile.       OV 06/08/2018  Subjective:  Patient ID: [name], female , DOB: 06-29-1932 , age 89 y.o. , MRN: [id number] , ADDRESS: [place] [id number]   06/08/2018 -   Chief Complaint  Patient presents with  . Follow-up    Pt states she has had sinus problems, cough with clear mucus, and has also had some chest pain/tightness. Pt has been seen by Derl Barrow for asthma exacerbations a couple different times. Pt denies any fever.   Severe asthma on Dupixent.  Also with chronic sinusitis  HPI Audra Oesterling 86 y.o. -returns for follow-up of her issues.  I last saw her in October 2019.  After that earlier this month she came to see nurse practitioner 2 times for acute on chronic sinus issues.  She tells me that she is tolerating her Dupixent injections just fine.  She feels it is helping her.  However, she is bothered by sinus issues this constantly sinus drainage particularly flared up in January 2020.  She recollects  seeing Dr. Redmond Baseman in ENT.  At the time we recommended amoxicillin but she was allergic to it and no antibiotic therefore was prescribed according to history.  Since then she has seen an allergist in her local  area and is been cleared to get amoxicillin.  She is okay seeing her ENT but she wants to change to her husband's ENT surgeon Dr. Elgie Congo.  Early this month with a sinus flush she got antibiotic and prednisone this helped her.  However she still has significant amount of residual symptoms.  Asthma control question is 2.8 but the nitric oxide level is normal.  This was excessive symptom burden.  She is waking up several times at night.  When she wakes up she has moderate symptoms she is moderately limited in her activities.  Moderate amount of shortness of breath and wheezing a little of the time and using albuterol for rescue at least 1 time daily.  She says is unimproved symptomatology because of the recent antibiotic and prednisone.  She feels she needs another course of antibiotic and prednisone as well.  In addition with the cough she is complaining of musculoskeletal pain in the upper chest and the back.  She feels the lungs are hurting.  There is no radiation.  The course is improving.  Of note, her past medical history lists her as having rheumatoid arthritis but she denies that to be the case.  She says she has osteoarthritis.  She does not see a rheumatologist.  She has had no prior immune work-up.    OV 02/28/2019  Subjective:  Patient ID: [name], female , DOB: 05-30-1932 , age 56 y.o. , MRN: [id number] , ADDRESS: [place] [id number]   02/28/2019 -   Chief Complaint  Patient presents with  . Follow-up    She reports her breathing has been at her baseline.Allergy to eggs, no flu shot. Reports her dupixent has been great.      HPI Salimah Bulthuis 86 y.o. -  Follow-up chronic severe persistent asthma: She is now on Dupixent, Symbicort, Spiriva and Singulair.   She says her asthma is well under control.  Albuterol rescue use is rare.  No nocturnal awakenings.  She has deferred flu shot because she states "I cannot take it"   Follow-up chronic sinusitis: She has seen ENT Dr. Lorelee Cover and has been prescribed Nasonex.  This is now under control.  New issue of ANA positivity 1: 1280 -in January 2020 because of chronic sinusitis I have ordered autoimmune and vasculitis profile.  Her ANCA profile is negative.  However her ANA is strongly positive.  She admits to having osteoarthritis and osteoporosis.  She denies she has rheumatoid arthritis although this is mentioned in her chart as past medical history.  She denies having seen a rheumatologist.  Review of the chart does not indicate she has had rheumatoid factor tested with an health system.    ROS - per HPI   OV 07/11/2019  Subjective:  Patient ID: [name], female , DOB: 09-06-1932 , age 46 y.o. , MRN: [id number] , ADDRESS: [place] [id number]   07/11/2019 -   Chief Complaint  Patient presents with  . Follow-up    Pt states she has been doing good since last visit. States she does have an occ cough.     HPI Naveya Flegal 86 y.o. -  Follow-up chronic severe asthma: Last seen in October 2020.  Since then she continues with her durvalumab on a scheduled basis.  She visits our office for that.  However she has become less compliant with the Symbicort.  She for the month of February 2021 she is only taken it 2 times.  She also  takes Spiriva only rarely.  She takes these 2 on a as needed basis.  She is compliant with her Singulair on a daily basis.  Albuterol is also as needed.  Today because of the weather changes rainy and warm she feels a remote tightness in her chest.  We do not have exam nitric oxide test.  Asthma control question is 0.8 and is at the upper limit of good control.  She feels the Dupixent has worked tremendously well for her  In terms of her chronic sinusitis:  She follows with ENT and takes Nasonex   In terms of her strong positivity for ANA: Last visit check extended autoimmune profile.  SSA was positive.  She then had a televisit in December 2020 with Dr. Keturah Barre.  I reviewed that note.  She has a face-to-face visit coming up in March 2021.  She does admit to dryness of the eyes.  She might have Sjogren's and I informed this to her.   In terms of vaccine counseling.  She has had allergy to flu shot.  She has other allergies documented below.  She does not want to do the COVID-19 vaccine.  She is social distancing.  She does outdoor church.  She wants to know when life will return to normal.  We discussed summer is a possibility.   OV 01/10/2020   Subjective:  Patient ID: [name], female , DOB: 02/09/1933, age 89 y.o. years. , MRN: [id number],  ADDRESS: [address] [place] [address] PCP  Crist Infante, MD Providers : Treatment Team:  Attending Provider: Brand Males, MD   Chief Complaint  Patient presents with  . Follow-up    Severe Asthma       HPI Disha Azevedo 86 y.o. -returns for follow-up of asthma.  Since her last visit she still remains Covid unvaccinated.  She is scared of the Covid vaccine because of egg allergy.  Because of egg allergy she normally does not do flu shot.  She has done other vaccines.  Asthma continues to be stable.  She is taking Dupixent at full dose every 2 weeks in our office.  She does not want to do this at home.  She does not want to reduce the dose.  She also takes Symbicort at 160/4.52 puff 2 times daily.  She is willing to reduce the dose for this ACT control score is 19 showing good control.  She recently relocated her sister who is 23 years old and has been doing chores.  She is trying to enjoy her vacation [place] mountains in  [place].  Of note even though ACT score is 19 and suggest poor control she tells me she barely ever wakes up in the middle of the night from  asthma.  She is not using her albuterol for rescue.  She feels asthma is well controlled.    Asthma Control Panel  10/11/15 - IgE - 425 , rest of blood allergy panel negative. Blood eos 300cells (al;so 300 on 12/19/16). CT Sinus feb 2018 - chronic pan sinusitis     07/02/2016 06/15/2017  08/13/2017  12/01/2017  06/08/2018  01/10/2020   Current Med Regimen  Spriva, symbicort,  Singulair, xolair, nexium same Spiriva, Symbicort, Singulair, Nexium and dupulimab Spiriva, Symbicort, Singulair, Nexium and dupulimab   Act - ACT - a [employer] test - 4 week. Total score. Max is 25, Lower score is worse.  <19 = poor control      19  ACQ 5  point- 1 week. wtd avg score. <1.0 is good control 0.75-1.25 is grey zone. >1.25 poor control. Delta 0.5 is clinically meaningful   2.4 0.6 2.9   FeNO ppB  126 ppb 104 ppb 43 19   FeV1  1.777/90%, ratio 74,        Planned intervention  for visit   HRCT, talk to eye doc and if ok start dupixent, augmenting, prednsione,        Asthma Control Test ACT Total Score  01/10/2020 19       has a past medical history of Anemia, Asthma, severe persistent, Cancer ([place]), Chronic kidney disease, stage II (mild), DDD (degenerative disc disease), cervical, Diverticulitis, Diverticulosis (yrs ago), Edema, lower extremity, GERD (gastroesophageal reflux disease), Headache, Heart murmur, History of breast cancer (1990 left mastectomy also), History of DVT of lower extremity (5 yrs ago), History of shingles, Hyperlipidemia, Internal hemorrhoid, Leg ulcer, left ([place]), Osteoporosis, unspecified, Perennial allergic rhinitis, Peripheral vascular disease ([place]), Pneumonia, Restless legs syndrome (RLS), Rheumatoid arthritis ([place]), Thyroid nodule, and Unspecified essential hypertension.   reports that she has never smoked. She has never used smokeless tobacco.  Past Surgical History:  Procedure Laterality Date  . CARDIAC CATHETERIZATION  08/ 01/ 2008   dr Cathie Olden   normal -- EF of 65%  . CARDIOVASCULAR  STRESS TEST  05-22-2011   dr Cathie Olden   normal lexiscan perfusion study/  no ischemia/  lvsf  86%  . CATARACT EXTRACTION W/ INTRAOCULAR LENS  IMPLANT, BILATERAL    . CHOLECYSTECTOMY  1993   laparoscopic  . CONVERSION TO TOTAL HIP Left 12/03/2017   Procedure: CONVERSION TO LEFT TOTAL HIP;  Surgeon: Paralee Cancel, MD;  Location: [facility];  Service: Orthopedics;  Laterality: Left;  90 mins  . HIP ARTHROPLASTY Left 12/22/2016   Procedure: HEMIARTHROPLASTY, LEFT;  Surgeon: Paralee Cancel, MD;  Location: [facility];  Service: Orthopedics;  Laterality: Left;  . INCISION AND DRAINAGE OF WOUND Left 10/24/2013   Procedure: IRRIGATION AND DEBRIDEMENT OF LEFT LEG WITH PLACEMENT OF ACELL AND WOUND VAC;  Surgeon: Theodoro Kos, DO;  Location: [facility];  Service: Plastics;  Laterality: Left;  . KNEE ARTHROPLASTY    . [place]  . MASTECTOMY Bilateral rigth 1989///   left  1990   breast cancer 1989//   fibrocytic disease 1990  . ORIF HUMERUS FRACTURE Right 09/21/2018   Procedure: OPEN REDUCTION INTERNAL FIXATION (ORIF) PROXIMAL HUMERUS FRACTURE;  Surgeon: Nicholes Stairs, MD;  Location: [facility];  Service: Orthopedics;  Laterality: Right;  90 mins  . TOTAL ABDOMINAL HYSTERECTOMY W/ BILATERAL SALPINGOOPHORECTOMY  1989   done at same time as right mastectomy  . TOTAL KNEE ARTHROPLASTY Right 10/05/2012   Procedure: RIGHT TOTAL KNEE ARTHROPLASTY;  Surgeon: Mauri Pole, MD;  Location: [facility];  Service: Orthopedics;  Laterality: Right;    Allergies  Allergen Reactions  . Eggs Or Egg-Derived Products     UNSPECIFIED REACTION  RAW EGGS.. Can eat cooked eggs  . Influenza Vaccines     UNSPECIFIED REACTION  Egg allergy   . Nucala [Mepolizumab]     UNSPECIFIED REACTION   . Cephalosporins Rash  . Codeine Rash  . Gabapentin Nausea Only  . Levofloxacin Rash  . Pneumococcal Vaccines Rash    Immunization History  Administered Date(s) Administered  . Pneumococcal  Conjugate-13 09/14/2012  . Pneumococcal Polysaccharide-23 01/31/2009  . Tdap 09/07/2018  . Zoster Recombinat (Shingrix) 04/18/2017, 09/16/2017    Family  History  Problem Relation Age of Onset  . Heart attack Mother   . Asthma Mother   . Heart disease Mother   . Hyperlipidemia Mother   . Heart attack Father   . Heart failure Father   . Deep vein thrombosis Father   . Heart disease Father   . Peripheral vascular disease Father        amputation  . Cancer Brother   . Asthma Brother   . Coronary artery disease Sister   . Heart disease Sister      Current Outpatient Medications:  .  acetaminophen (TYLENOL) 500 MG tablet, Take 2 tablets (1,000 mg total) by mouth every 8 (eight) hours. (Patient taking differently: Take 1,000 mg by mouth every 6 (six) hours as needed (pain). ), Disp: 30 tablet, Rfl: 0 .  albuterol (PROAIR HFA) 108 (90 Base) MCG/ACT inhaler, Inhale 2 puffs into the lungs every 6 (six) hours as needed for wheezing or shortness of breath., Disp: 18 g, Rfl: 5 .  albuterol (PROVENTIL) (2.5 MG/3ML) 0.083% nebulizer solution, Take 3 mLs (2.5 mg total) by nebulization every 6 (six) hours as needed for wheezing or shortness of breath. Dx: J45.41, Disp: 360 mL, Rfl: 0 .  benzonatate (TESSALON) 100 MG capsule, Take 1 capsule (100 mg total) by mouth 3 (three) times daily as needed for cough., Disp: 100 capsule, Rfl: 1 .  budesonide-formoterol (SYMBICORT) 160-4.5 MCG/ACT inhaler, Inhale 2 puffs into the lungs 2 (two) times daily., Disp: 3 Inhaler, Rfl: 3 .  Calcium Carbonate (CALTRATE 600 PO), Caltrate, Disp: , Rfl:  .  Calcium Carbonate-Vitamin D (CALTRATE 600+D) 600-400 MG-UNIT per tablet, Take 1 tablet by mouth daily. , Disp: , Rfl:  .  chlorpheniramine (CHLOR-TRIMETON) 4 MG tablet, Take 4-8 mg by mouth See admin instructions. 4 mg in the morning and 8 mg at bedtime , Disp: , Rfl:  .  Cholecalciferol (VITAMIN D3) 1000 UNITS tablet, Take 1,000 Units by mouth every evening. , Disp: ,  Rfl:  .  dextromethorphan (DELSYM) 30 MG/5ML liquid, Take 60 mg by mouth 2 (two) times daily as needed for cough., Disp: , Rfl:  .  Dextromethorphan Polistirex (DELSYM PO), Delsym, Disp: , Rfl:  .  dextromethorphan-guaiFENesin (MUCINEX DM) 30-600 MG 12hr tablet, Take 1 tablet by mouth daily as needed for cough., Disp: , Rfl:  .  dupilumab (DUPIXENT) 300 MG/2ML prefilled syringe, Inject 300 mg into the skin every 14 (fourteen) days., Disp: 4 mL, Rfl: 5 .  fexofenadine (ALLEGRA) 180 MG tablet, Take 180 mg by mouth daily.  , Disp: , Rfl:  .  furosemide (LASIX) 20 MG tablet, Take 20 mg by mouth daily., Disp: , Rfl:  .  gabapentin (NEURONTIN) 300 MG capsule, Take 300 mg by mouth daily as needed for pain., Disp: , Rfl:  .  ibandronate (BONIVA) 150 MG tablet, Take 1 tablet by mouth every 30 (thirty) days. , Disp: , Rfl: 3 .  ipratropium (ATROVENT) 0.06 % nasal spray, Place 2 sprays into both nostrils 2 (two) times daily., Disp: , Rfl:  .  mometasone (NASONEX) 50 MCG/ACT nasal spray, USE 2 SPRAYS NASALLY 2 TIMES DAILY, Disp: 17 g, Rfl: 11 .  montelukast (SINGULAIR) 10 MG tablet, TAKE 1 TABLET BY MOUTH EVERY NIGHT AT BEDTIME, Disp: 90 tablet, Rfl: 1 .  Multiple Vitamin (MULTIVITAMIN WITH MINERALS) TABS tablet, Take 1 tablet by mouth daily. Centrum Silver Multivitamin, Disp: , Rfl:  .  Multiple Vitamins-Minerals (PRESERVISION AREDS PO), Take 1 tablet by mouth 2 (  two) times daily., Disp: , Rfl:  .  mupirocin ointment (BACTROBAN) 2 %, mupirocin 2 % topical ointment  APPLY TO AFFECTED AREA 3 TIMES A DAY, Disp: , Rfl:  .  NEXIUM 40 MG capsule, TAKE 1 CAPSULE BY MOUTH TWICE A DAY, Disp: 180 capsule, Rfl: 1 .  potassium chloride (KLOR-CON) 10 MEQ tablet, Take 1 tablet (10 mEq total) by mouth daily., Disp: 90 tablet, Rfl: 3 .  pramipexole (MIRAPEX) 0.125 MG tablet, Take 0.375 mg by mouth at bedtime. , Disp: , Rfl:  .  rosuvastatin (CRESTOR) 5 MG tablet, TAKE 1 TABLET BY MOUTH EVERYDAY AT BEDTIME, Disp: 90 tablet,  Rfl: 2 .  Spacer/Aero-Holding Chambers (AEROCHAMBER MV) inhaler, by Other route. Use as instructed , Disp: , Rfl:  .  telmisartan (MICARDIS) 20 MG tablet, Take 20 mg by mouth daily. , Disp: , Rfl:  .  vitamin B-12 (CYANOCOBALAMIN) 500 MCG tablet, Take 500 mcg by mouth daily.  , Disp: , Rfl:  .  XARELTO 20 MG TABS tablet, Take 20 mg by mouth daily with supper. , Disp: , Rfl:   Current Facility-Administered Medications:  .  omalizumab (XOLAIR) injection 300 mg, 300 mg, Subcutaneous, Q14 Days, Aldin Drees, MD, 300 mg at 07/27/17 1013 .  omalizumab Arvid Right) injection 300 mg, 300 mg, Subcutaneous, Q14 Days, Elizbeth Posa, MD, 300 mg at 08/13/17 0815 .  omalizumab Arvid Right) injection 300 mg, 300 mg, Subcutaneous, Q14 Days, Mineola Duan, Belva Crome, MD, 300 mg at 08/27/17 0950      Objective:   Vitals:   01/10/20 1421  BP: 130/70  Pulse: 77  Temp: (!) 97.3 F (36.3 C)  TempSrc: Oral  SpO2: 96%  Weight: 148 lb 1.6 oz (67.2 kg)  Height: 5\' 3"  (1.6 m)    Estimated body mass index is 26.23 kg/m as calculated from the following:   Height as of this encounter: 5\' 3"  (1.6 m).   Weight as of this encounter: 148 lb 1.6 oz (67.2 kg).  @WEIGHTCHANGE @  [employer]   01/10/20 1421  Weight: 148 lb 1.6 oz (67.2 kg)     Physical Exam No oral thrush.  No wheeze no crackles.  Alert and oriented x3 has osteoarthritis.  Otherwise nonfocal exam.         Assessment:       ICD-10-CM   1. Severe persistent asthma without complication  M35.59 dupilumab (DUPIXENT) prefilled syringe 300 mg  2. Vaccine counseling  Z71.89        Plan:     Patient Instructions  Severe persistent asthma without complication  -under control   Plan  - continue dupixent,  and singulair and Symbicort  -Respect desire to have Dupixent in the office as opposed to your home -Continue Symbicort  160/4.5 strength but you can try reduce dosing of 1 puff twice daily -Continue albuterol as needed -Please visit  with the primary care physician again about taking Covid vaccine.  -Egg allergy might not apply to Covid vaccine  Follow-up 6-9 months or sooner if needed: ACT Test and if possible nitric oxide testing at follow-up      SIGNATURE    Dr. Brand Males, M.D., F.C.C.P,  Pulmonary and Critical Care Medicine Staff Physician, [place] Director - Interstitial Lung Disease  Program  [facility] [place], [place], [address]  Pager: [phone], If no answer or between  15:00h - 7:00h: call [phone] Telephone: [phone]  3:10 PM 01/10/2020

## 2020-01-10 NOTE — Progress Notes (Signed)
Have you been hospitalized within the last 10 days?  No Do you have a fever?  No Do you have a cough?  No Do you have a headache or sore throat? No Do you have your Epi Pen visible and is it within date?  Yes 

## 2020-01-10 NOTE — Progress Notes (Signed)
Have you been hospitalized within the last 10 days?  No Do you have a fever?  No Do you have a cough?  No Do you have a headache or sore throat? No Do you have your Epi Pen visible and is it within date?  Yes 

## 2020-01-24 ENCOUNTER — Other Ambulatory Visit: Payer: Self-pay

## 2020-01-24 ENCOUNTER — Ambulatory Visit (INDEPENDENT_AMBULATORY_CARE_PROVIDER_SITE_OTHER): Payer: Medicare Other

## 2020-01-24 DIAGNOSIS — J4550 Severe persistent asthma, uncomplicated: Secondary | ICD-10-CM | POA: Diagnosis not present

## 2020-01-24 MED ORDER — DUPILUMAB 300 MG/2ML ~~LOC~~ SOSY
300.0000 mg | PREFILLED_SYRINGE | Freq: Once | SUBCUTANEOUS | Status: AC
  Administered 2020-01-24: 300 mg via SUBCUTANEOUS

## 2020-01-24 NOTE — Progress Notes (Signed)
Have you been hospitalized within the last 10 days?  No Do you have a fever?  No Do you have a cough?  No Do you have a headache or sore throat? No Do you have your Epi Pen visible and is it within date?  Yes 

## 2020-01-31 ENCOUNTER — Telehealth: Payer: Self-pay | Admitting: Internal Medicine

## 2020-01-31 NOTE — Telephone Encounter (Signed)
Dupixent Shipment Received: 300mg  #2 prefilled syringe Medication arrival date: 01/31/20 Lot #: [id number] Exp date: 05/11/2022 Received by: Elliot Dally

## 2020-02-06 ENCOUNTER — Other Ambulatory Visit: Payer: Self-pay | Admitting: Internal Medicine

## 2020-02-07 ENCOUNTER — Ambulatory Visit (INDEPENDENT_AMBULATORY_CARE_PROVIDER_SITE_OTHER): Payer: Medicare Other

## 2020-02-07 ENCOUNTER — Other Ambulatory Visit: Payer: Self-pay

## 2020-02-07 DIAGNOSIS — J4550 Severe persistent asthma, uncomplicated: Secondary | ICD-10-CM

## 2020-02-07 MED ORDER — DUPILUMAB 300 MG/2ML ~~LOC~~ SOSY
300.0000 mg | PREFILLED_SYRINGE | Freq: Once | SUBCUTANEOUS | Status: AC
  Administered 2020-02-07: 300 mg via SUBCUTANEOUS

## 2020-02-07 NOTE — Progress Notes (Signed)
Have you been hospitalized within the last 10 days?  No Do you have a fever?  No Do you have a cough?  No Do you have a headache or sore throat? No Do you have your Epi Pen visible and is it within date?  Yes 

## 2020-02-17 NOTE — Progress Notes (Signed)
Office Visit Note  Patient: [name]             Date of Birth: 09-Feb-1933           MRN: [id number]             PCP: Crist Infante, MD Referring: Crist Infante, MD Visit Date: 03/01/2020 Occupation: _0 @  Subjective:  Eye dryness   History of Present Illness: [name] is a 86 y.o. female with Sjogren syndrome and DDD.  Patient reports that she continues to have chronic eye dryness.  She has been using Systane eyedrops on a daily basis for symptomatic relief.  She recently was evaluated by her ophthalmologist and according to the patient no other recommendations were made at this time.  She denies any mouth dryness at this time.  She was evaluated by her dentist yesterday and denies any recent dental caries. She denies any enlarged lymph nodes.  She denies any recent rashes or photosensitivity.  She reports that she continues to experience intermittent pain and stiffness in both hands.  She has been crocheting on a regular basis.  She has noticed intermittent swelling in her right second DIP joint.  She uses Pennsaid topically as needed.  She is also been using her right CMC joint brace at times.  She denies any increased neck or lower back pain.  She states that her right knee replacement and left hip replacement are doing well.     Activities of Daily Living:  Patient reports morning stiffness for 0  minutes.   Patient Reports nocturnal pain.  Difficulty dressing/grooming: Denies Difficulty climbing stairs: Denies Difficulty getting out of chair: Reports Difficulty using hands for taps, buttons, cutlery, and/or writing: Reports  Review of Systems  Constitutional: Positive for fatigue.  HENT: Negative for mouth sores, mouth dryness and nose dryness.   Eyes: Positive for pain and dryness. Negative for itching.  Respiratory: Positive for cough, shortness of breath and difficulty breathing.   Cardiovascular: Negative for chest pain and palpitations.  Gastrointestinal:  Negative for blood in stool, constipation and diarrhea.  Endocrine: Negative for increased urination.  Genitourinary: Negative for difficulty urinating.  Musculoskeletal: Positive for arthralgias, joint pain and joint swelling. Negative for myalgias, morning stiffness, muscle tenderness and myalgias.  Skin: Negative for color change, rash and redness.  Allergic/Immunologic: Negative for susceptible to infections.  Neurological: Negative for dizziness, numbness, headaches, memory loss and weakness.  Hematological: Positive for bruising/bleeding tendency.  Psychiatric/Behavioral: Positive for sleep disturbance. Negative for confusion.    PMFS History:  Patient Active Problem List   Diagnosis Date Noted  . Chest pain 12/22/2018  . Displaced fracture of greater tuberosity of right humerus, initial encounter for closed fracture 09/21/2018  . Closed fracture of right proximal humerus 09/21/2018  . S/P left TH revision 12/03/2017  . Essential hypertension 11/17/2017  . Chronic diastolic CHF (congestive heart failure) ([place]) 03/19/2017  . CAD (coronary artery disease), native coronary artery 12/20/2016  . Closed left hip fracture ([place]) 12/19/2016  . CKD (chronic kidney disease) stage 3, GFR 30-59 ml/min (HCC) 12/19/2016  . History of DVT of lower extremity   . Lung nodule 08/03/2014  . Peripheral vascular disease, unspecified ([place]) 09/12/2013  . Hyperlipidemia 03/03/2013  . Expected blood loss anemia 10/06/2012  . Overweight (BMI 25.0-29.9) 10/06/2012  . DJD (degenerative joint disease) 05/26/2012  . Severe persistent asthma 08/05/2007  . Hypertensive heart disease without CHF 04/30/2007  . GERD 01/13/2007    Past Medical History:  Diagnosis  Date  . Anemia    after hysterectomy  . Asthma, severe persistent    pulmologist-- dr Lynford Citizen  . Cancer [facility])    Breast cancer on right  . Chronic kidney disease, stage II (mild)   . DDD (degenerative disc disease), cervical   .  Diverticulitis   . Diverticulosis yrs ago   hx of   . Edema, lower extremity    occ both legs swell  . GERD (gastroesophageal reflux disease)   . Headache    sinus headaches  . Heart murmur    no problems - per pt  . History of breast cancer 1990 left mastectomy also   1989  S/P   RIGHT MASTECTOMY ;  NO CHEMORADIATION //   NO RECURRENCE  . History of DVT of lower extremity 5 yrs ago   right leg  . History of shingles   . Hyperlipidemia   . Internal hemorrhoid   . Leg ulcer, left ([place])   . Osteoporosis, unspecified    Knee and hip osteoarthritis bilaterally  . Perennial allergic rhinitis   . Peripheral vascular disease ([place])   . Pneumonia   . Restless legs syndrome (RLS)   . Rheumatoid arthritis ([place])    hands  . Thyroid nodule    followed by dr perrini yearly, no current problam  . Unspecified essential hypertension     Family History  Problem Relation Age of Onset  . Heart attack Mother   . Asthma Mother   . Heart disease Mother   . Hyperlipidemia Mother   . Heart attack Father   . Heart failure Father   . Deep vein thrombosis Father   . Heart disease Father   . Peripheral vascular disease Father        amputation  . Cancer Brother   . Asthma Brother   . Coronary artery disease Sister   . Heart disease Sister    Past Surgical History:  Procedure Laterality Date  . CARDIAC CATHETERIZATION  08/ 01/ 2008   dr Cathie Olden   normal -- EF of 65%  . CARDIOVASCULAR STRESS TEST  05-22-2011   dr Cathie Olden   normal lexiscan perfusion study/  no ischemia/  lvsf  86%  . CATARACT EXTRACTION W/ INTRAOCULAR LENS  IMPLANT, BILATERAL    . CHOLECYSTECTOMY  1993   laparoscopic  . CONVERSION TO TOTAL HIP Left 12/03/2017   Procedure: CONVERSION TO LEFT TOTAL HIP;  Surgeon: Paralee Cancel, MD;  Location: [facility];  Service: Orthopedics;  Laterality: Left;  90 mins  . HIP ARTHROPLASTY Left 12/22/2016   Procedure: HEMIARTHROPLASTY, LEFT;  Surgeon: Paralee Cancel, MD;  Location: [facility];  Service:  Orthopedics;  Laterality: Left;  . INCISION AND DRAINAGE OF WOUND Left 10/24/2013   Procedure: IRRIGATION AND DEBRIDEMENT OF LEFT LEG WITH PLACEMENT OF ACELL AND WOUND VAC;  Surgeon: Theodoro Kos, DO;  Location: [facility];  Service: Plastics;  Laterality: Left;  . KNEE ARTHROPLASTY    . [place]  . MASTECTOMY Bilateral rigth 1989///   left  1990   breast cancer 1989//   fibrocytic disease 1990  . ORIF HUMERUS FRACTURE Right 09/21/2018   Procedure: OPEN REDUCTION INTERNAL FIXATION (ORIF) PROXIMAL HUMERUS FRACTURE;  Surgeon: Nicholes Stairs, MD;  Location: [facility];  Service: Orthopedics;  Laterality: Right;  90 mins  . TOTAL ABDOMINAL HYSTERECTOMY W/ BILATERAL SALPINGOOPHORECTOMY  1989   done at same time as right mastectomy  . TOTAL  KNEE ARTHROPLASTY Right 10/05/2012   Procedure: RIGHT TOTAL KNEE ARTHROPLASTY;  Surgeon: Mauri Pole, MD;  Location: [facility];  Service: Orthopedics;  Laterality: Right;   Social History   Social History Narrative   Originally from [place]. Always lived in [place]. Has traveled across the country to [place]. 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 No known asbestos exposure (brother with mesothelioma). No pets currently. No bird exposure. No mold exposure. Carpet has been removed from her home.    Immunization History  Administered Date(s) Administered  . Pneumococcal Conjugate-13 09/14/2012  . Pneumococcal Polysaccharide-23 01/31/2009  . Tdap 09/07/2018  . Zoster Recombinat (Shingrix) 04/18/2017, 09/16/2017     Objective: Vital Signs: BP (!) 150/78 (BP Location: Left Arm, Patient Position: Sitting, Cuff Size: Normal)   Pulse 73   Resp 16   Ht _0  (1.651 m)   Wt 147 lb 9.6 oz (67 kg)   BMI 24.56 kg/m    Physical Exam Vitals and nursing note reviewed.  Constitutional:      Appearance: She is well-developed.  HENT:     Head: Normocephalic and atraumatic.  Eyes:      Conjunctiva/sclera: Conjunctivae normal.  Pulmonary:     Effort: Pulmonary effort is normal.  Abdominal:     Palpations: Abdomen is soft.  Musculoskeletal:     Cervical back: Normal range of motion.  Skin:    General: Skin is warm and dry.     Capillary Refill: Capillary refill takes less than 2 seconds.  Neurological:     Mental Status: She is alert and oriented to person, place, and time.  Psychiatric:        Behavior: Behavior normal.      Musculoskeletal Exam: C-spine limited ROM.  Postural thoracic kyphosis.  No midline spinal tenderness.  No SI joint tenderness.  Right shoulder has slightly limited abduction.  Left shoulder has full ROM with no discomfort.  Right elbow mild flexion contracture.  Left elbow has good ROM with no tenderness or inflammation. Wrist joints, MCPs, PIPs, and DIPs good ROM with no discomfort.  Complete fist formation bilaterally.  Tenderness and inflammation in the right 2nd DIP joint.  PIP and DIP thickening consistent with osteoarthritis of both hands.  Grimes joint prominence bilaterally.  Left hip replacement has good ROM with no discomfort.  Right hip has good ROM with no discomfort.  Right knee replacement is warm but has good ROM and no joint swelling.  Left knee joint good ROM with no warmth or effusion.  Ankle joints good ROM with no tenderness or inflammation.   CDAI Exam: CDAI Score: -- Patient Global: --; Provider Global: -- Swollen: --; Tender: -- Joint Exam 03/01/2020   No joint exam has been documented for this visit   There is currently no information documented on the homunculus. Go to the Rheumatology activity and complete the homunculus joint exam.  Investigation: No additional findings.  Imaging: No results found.  Recent Labs: Lab Results  Component Value Date   WBC 4.8 08/22/2019   HGB 12.1 08/22/2019   PLT 252 08/22/2019   NA 137 08/22/2019   K 4.0 08/22/2019   CL 101 08/22/2019   CO2 29 08/22/2019   GLUCOSE 83 08/22/2019    BUN 16 08/22/2019   CREATININE 1.13 (H) 08/22/2019   BILITOT 0.4 08/22/2019   ALKPHOS 84 03/19/2017   AST 19 08/22/2019   ALT 16 08/22/2019   PROT 5.8 (L) 08/22/2019   ALBUMIN  4.0 03/19/2017   CALCIUM 8.9 08/22/2019   GFRAA 52 (L) 08/22/2019    Speciality Comments: No specialty comments available.  Procedures:  No procedures performed Allergies: Eggs or egg-derived products, Influenza vaccines, Nucala [mepolizumab], Cephalosporins, Codeine, Gabapentin, Levofloxacin, and Pneumococcal vaccines   Assessment / Plan:     Visit Diagnoses: Sjogren's syndrome with other organ involvement ([place]) - +ANA, +Ro: She has persistent eye dryness.  She has been using Systane eyedrops on a daily basis for symptomatic relief.  She continues to follow-up with her ophthalmologist closely.  According to the patient no other recommendations were made at her last office visit.  She is not experiencing any mouth dryness at this time.  She was evaluated by her dentist yesterday and did not have any dental caries.  She has not noticed any cervical lymphadenopathy.  She has not had any recent rashes or photosensitivity.  She has no other clinical features of autoimmune disease at this time.  She continues to experience intermittent arthralgias and joint stiffness but has no signs of inflammatory arthritis at this time.  She does not require immunosuppressive therapy at this time.  She has upcoming lab work with her PCP next week and was given an order for CBC, CMP, UA, and SPEP to be drawn.  She was advised to notify us if she develops any new or worsening symptoms.  She will follow-up in the office in 6 months.  Positive ANA (antinuclear antibody) - Positive ANA 1:1280 cytoplasmic, Ro >8, ESR 87, mild eye dryness.  Lab work from 08/22/2019 was reviewed with the patient today in the office.  She has no other clinical features of autoimmune disease at this time.  She does not require immunosuppressive therapy.  She was  advised to notify us if she develops any new or worsening symptoms.  Primary osteoarthritis of both hands:  She has PIP and DIP thickening consistent with osteoarthritis of both hands.  CMC joint prominence noted bilaterally.  She has tenderness of the right CMC joint.  Tenderness and mild inflammation of the right second DIP joint was noted.  We discussed the use of a right CMC joint brace as well as compression arthritis gloves.  She has been using Pennsaid topically as needed for pain relief.  We discussed the importance of joint protection and muscle strengthening.  She was given a handout of hand exercises to perform.  She was advised to notify us if she would like to return for an ultrasound-guided right second DIP joint cortisone injection in the future if her symptoms persist or worsen.  She plans on continuing to crochet to improve her fine motor movements and joint stiffness.  S/P left TH revision - Dr. Alvan Dame: Doing well.  She has good ROM with no discomfort on exam today.  No groin pain.  She has not had any recent falls.   Status post total knee replacement, right - Dr. Alvan Dame. Doing well.  She has good ROM with no discomfort. Warmth but no joint swelling noted.   History of humerus fracture - 09/07/18. She went to physical therapy in the past and performed home stretching and strengthening exercises.  She has had significant improvement in her ROM.  She has almost full abduction on exam today without discomfort.  No joint tenderness noted on exam.   DDD (degenerative disc disease), lumbar: s/p surgery in the past-She is not experiencing any increased lower back pain or stiffness at this time. She has no symptoms of radiculopathy at this  time.   Other medical conditions are listed as follows:   Lung nodule  Severe persistent asthma without complication  Essential hypertension  Chronic diastolic CHF (congestive heart failure) (HCC)  Coronary artery disease involving native coronary  artery of native heart without angina pectoris  History of DVT of lower extremity  History of hyperlipidemia  History of gastroesophageal reflux (GERD)  Stage 3 chronic kidney disease, unspecified whether stage 3a or 3b CKD ([place])  Hypertensive heart disease without CHF  Orders: No orders of the defined types were placed in this encounter.  No orders of the defined types were placed in this encounter.     Follow-Up Instructions: Return in about 6 months (around 08/30/2020) for Sjogren's syndrome, Osteoarthritis, DDD.   Ofilia Neas, PA-C  Note - This record has been created using Dragon software.  Chart creation errors have been sought, but may not always  have been located. Such creation errors do not reflect on  the standard of medical care.

## 2020-02-21 ENCOUNTER — Other Ambulatory Visit: Payer: Self-pay

## 2020-02-21 ENCOUNTER — Ambulatory Visit (INDEPENDENT_AMBULATORY_CARE_PROVIDER_SITE_OTHER): Payer: Medicare Other

## 2020-02-21 DIAGNOSIS — J4550 Severe persistent asthma, uncomplicated: Secondary | ICD-10-CM | POA: Diagnosis not present

## 2020-02-21 MED ORDER — DUPILUMAB 300 MG/2ML ~~LOC~~ SOSY
300.0000 mg | PREFILLED_SYRINGE | Freq: Once | SUBCUTANEOUS | Status: AC
  Administered 2020-02-21: 300 mg via SUBCUTANEOUS

## 2020-02-21 NOTE — Progress Notes (Signed)
Have you been hospitalized within the last 10 days?  No Do you have a fever?  No Do you have a cough?  No Do you have a headache or sore throat? No Do you have your Epi Pen visible and is it within date?  Yes 

## 2020-02-23 DIAGNOSIS — R0982 Postnasal drip: Secondary | ICD-10-CM | POA: Diagnosis not present

## 2020-02-23 DIAGNOSIS — R053 Chronic cough: Secondary | ICD-10-CM | POA: Diagnosis not present

## 2020-02-26 ENCOUNTER — Other Ambulatory Visit: Payer: Self-pay | Admitting: Cardiovascular Disease

## 2020-02-28 ENCOUNTER — Telehealth: Payer: Self-pay | Admitting: Internal Medicine

## 2020-02-28 NOTE — Telephone Encounter (Signed)
Dupixent Shipment Received: 300mg  #2 prefilled syringe Medication arrival date: 02/28/20 Lot #: [id number] Exp date: 07/09/2022 Received by: Elliot Dally

## 2020-03-01 ENCOUNTER — Ambulatory Visit (INDEPENDENT_AMBULATORY_CARE_PROVIDER_SITE_OTHER): Payer: Medicare Other | Admitting: Physician Assistant

## 2020-03-01 ENCOUNTER — Encounter: Payer: Self-pay | Admitting: Physician Assistant

## 2020-03-01 ENCOUNTER — Other Ambulatory Visit: Payer: Self-pay

## 2020-03-01 VITALS — BP 150/78 | HR 73 | Resp 16 | Ht 65.0 in | Wt 147.6 lb

## 2020-03-01 DIAGNOSIS — Z8639 Personal history of other endocrine, nutritional and metabolic disease: Secondary | ICD-10-CM

## 2020-03-01 DIAGNOSIS — Z96651 Presence of right artificial knee joint: Secondary | ICD-10-CM | POA: Diagnosis not present

## 2020-03-01 DIAGNOSIS — M19042 Primary osteoarthritis, left hand: Secondary | ICD-10-CM

## 2020-03-01 DIAGNOSIS — M3509 Sicca syndrome with other organ involvement: Secondary | ICD-10-CM

## 2020-03-01 DIAGNOSIS — I119 Hypertensive heart disease without heart failure: Secondary | ICD-10-CM

## 2020-03-01 DIAGNOSIS — I2510 Atherosclerotic heart disease of native coronary artery without angina pectoris: Secondary | ICD-10-CM | POA: Diagnosis not present

## 2020-03-01 DIAGNOSIS — I5032 Chronic diastolic (congestive) heart failure: Secondary | ICD-10-CM

## 2020-03-01 DIAGNOSIS — Z8781 Personal history of (healed) traumatic fracture: Secondary | ICD-10-CM | POA: Diagnosis not present

## 2020-03-01 DIAGNOSIS — J4550 Severe persistent asthma, uncomplicated: Secondary | ICD-10-CM

## 2020-03-01 DIAGNOSIS — Z96649 Presence of unspecified artificial hip joint: Secondary | ICD-10-CM | POA: Diagnosis not present

## 2020-03-01 DIAGNOSIS — M19041 Primary osteoarthritis, right hand: Secondary | ICD-10-CM

## 2020-03-01 DIAGNOSIS — I10 Essential (primary) hypertension: Secondary | ICD-10-CM | POA: Diagnosis not present

## 2020-03-01 DIAGNOSIS — M5136 Other intervertebral disc degeneration, lumbar region: Secondary | ICD-10-CM

## 2020-03-01 DIAGNOSIS — N183 Chronic kidney disease, stage 3 unspecified: Secondary | ICD-10-CM

## 2020-03-01 DIAGNOSIS — R911 Solitary pulmonary nodule: Secondary | ICD-10-CM

## 2020-03-01 DIAGNOSIS — Z8719 Personal history of other diseases of the digestive system: Secondary | ICD-10-CM

## 2020-03-01 DIAGNOSIS — R768 Other specified abnormal immunological findings in serum: Secondary | ICD-10-CM | POA: Diagnosis not present

## 2020-03-01 DIAGNOSIS — Z86718 Personal history of other venous thrombosis and embolism: Secondary | ICD-10-CM

## 2020-03-01 NOTE — Patient Instructions (Addendum)
Please have CBC with diff, CMP with GFR, and Urine analysis checked and forwarded to our office      Hand Exercises Hand exercises can be helpful for almost anyone. These exercises can strengthen the hands, improve flexibility and movement, and increase blood flow to the hands. These results can make work and daily tasks easier. Hand exercises can be especially helpful for people who have joint pain from arthritis or have nerve damage from overuse (carpal tunnel syndrome). These exercises can also help people who have injured a hand. Exercises Most of these hand exercises are gentle stretching and motion exercises. It is usually safe to do them often throughout the day. Warming up your hands before exercise may help to reduce stiffness. You can do this with gentle massage or by placing your hands in warm water for 10-15 minutes. It is normal to feel some stretching, pulling, tightness, or mild discomfort as you begin new exercises. This will gradually improve. Stop an exercise right away if you feel sudden, severe pain or your pain gets worse. Ask your health care provider which exercises are best for you. Knuckle bend or "claw" fist 1. Stand or sit with your arm, hand, and all five fingers pointed straight up. Make sure to keep your wrist straight during the exercise. 2. Gently bend your fingers down toward your palm until the tips of your fingers are touching the top of your palm. Keep your big knuckle straight and just bend the small knuckles in your fingers. 3. Hold this position for __________ seconds. 4. Straighten (extend) your fingers back to the starting position. Repeat this exercise 5-10 times with each hand. Full finger fist 1. Stand or sit with your arm, hand, and all five fingers pointed straight up. Make sure to keep your wrist straight during the exercise. 2. Gently bend your fingers into your palm until the tips of your fingers are touching the middle of your palm. 3. Hold this  position for __________ seconds. 4. Extend your fingers back to the starting position, stretching every joint fully. Repeat this exercise 5-10 times with each hand. Straight fist 1. Stand or sit with your arm, hand, and all five fingers pointed straight up. Make sure to keep your wrist straight during the exercise. 2. Gently bend your fingers at the big knuckle, where your fingers meet your hand, and the middle knuckle. Keep the knuckle at the tips of your fingers straight and try to touch the bottom of your palm. 3. Hold this position for __________ seconds. 4. Extend your fingers back to the starting position, stretching every joint fully. Repeat this exercise 5-10 times with each hand. Tabletop 1. Stand or sit with your arm, hand, and all five fingers pointed straight up. Make sure to keep your wrist straight during the exercise. 2. Gently bend your fingers at the big knuckle, where your fingers meet your hand, as far down as you can while keeping the small knuckles in your fingers straight. Think of forming a tabletop with your fingers. 3. Hold this position for __________ seconds. 4. Extend your fingers back to the starting position, stretching every joint fully. Repeat this exercise 5-10 times with each hand. Finger spread 1. Place your hand flat on a table with your palm facing down. Make sure your wrist stays straight as you do this exercise. 2. Spread your fingers and thumb apart from each other as far as you can until you feel a gentle stretch. Hold this position for __________ seconds. 3.  Bring your fingers and thumb tight together again. Hold this position for __________ seconds. Repeat this exercise 5-10 times with each hand. Making circles 1. Stand or sit with your arm, hand, and all five fingers pointed straight up. Make sure to keep your wrist straight during the exercise. 2. Make a circle by touching the tip of your thumb to the tip of your index finger. 3. Hold for __________  seconds. Then open your hand wide. 4. Repeat this motion with your thumb and each finger on your hand. Repeat this exercise 5-10 times with each hand. Thumb motion 1. Sit with your forearm resting on a table and your wrist straight. Your thumb should be facing up toward the ceiling. Keep your fingers relaxed as you move your thumb. 2. Lift your thumb up as high as you can toward the ceiling. Hold for __________ seconds. 3. Bend your thumb across your palm as far as you can, reaching the tip of your thumb for the small finger (pinkie) side of your palm. Hold for __________ seconds. Repeat this exercise 5-10 times with each hand. Grip strengthening  1. Hold a stress ball or other soft ball in the middle of your hand. 2. Slowly increase the pressure, squeezing the ball as much as you can without causing pain. Think of bringing the tips of your fingers into the middle of your palm. All of your finger joints should bend when doing this exercise. 3. Hold your squeeze for __________ seconds, then relax. Repeat this exercise 5-10 times with each hand. Contact a health care provider if:  Your hand pain or discomfort gets much worse when you do an exercise.  Your hand pain or discomfort does not improve within 2 hours after you exercise. If you have any of these problems, stop doing these exercises right away. Do not do them again unless your health care provider says that you can. Get help right away if:  You develop sudden, severe hand pain or swelling. If this happens, stop doing these exercises right away. Do not do them again unless your health care provider says that you can. This information is not intended to replace advice given to you by your health care provider. Make sure you discuss any questions you have with your health care provider. Document Revised: 08/19/2018 Document Reviewed: 04/29/2018 Elsevier Patient Education  [employer].

## 2020-03-06 ENCOUNTER — Ambulatory Visit (INDEPENDENT_AMBULATORY_CARE_PROVIDER_SITE_OTHER): Payer: Medicare Other

## 2020-03-06 ENCOUNTER — Other Ambulatory Visit: Payer: Self-pay

## 2020-03-06 DIAGNOSIS — J4550 Severe persistent asthma, uncomplicated: Secondary | ICD-10-CM | POA: Diagnosis not present

## 2020-03-06 MED ORDER — DUPILUMAB 300 MG/2ML ~~LOC~~ SOSY
300.0000 mg | PREFILLED_SYRINGE | Freq: Once | SUBCUTANEOUS | Status: AC
  Administered 2020-03-06: 300 mg via SUBCUTANEOUS

## 2020-03-06 NOTE — Progress Notes (Signed)
Have you been hospitalized within the last 10 days?  No Do you have a fever?  No Do you have a cough?  No Do you have a headache or sore throat? No Do you have your Epi Pen visible and is it within date?  Yes 

## 2020-03-07 DIAGNOSIS — M810 Age-related osteoporosis without current pathological fracture: Secondary | ICD-10-CM | POA: Diagnosis not present

## 2020-03-07 DIAGNOSIS — M3509 Sicca syndrome with other organ involvement: Secondary | ICD-10-CM | POA: Diagnosis not present

## 2020-03-07 DIAGNOSIS — E039 Hypothyroidism, unspecified: Secondary | ICD-10-CM | POA: Diagnosis not present

## 2020-03-07 DIAGNOSIS — E785 Hyperlipidemia, unspecified: Secondary | ICD-10-CM | POA: Diagnosis not present

## 2020-03-07 DIAGNOSIS — R7301 Impaired fasting glucose: Secondary | ICD-10-CM | POA: Diagnosis not present

## 2020-03-14 DIAGNOSIS — Z0000 Encounter for general adult medical examination without abnormal findings: Secondary | ICD-10-CM | POA: Diagnosis not present

## 2020-03-14 DIAGNOSIS — M810 Age-related osteoporosis without current pathological fracture: Secondary | ICD-10-CM | POA: Diagnosis not present

## 2020-03-14 DIAGNOSIS — N1831 Chronic kidney disease, stage 3a: Secondary | ICD-10-CM | POA: Diagnosis not present

## 2020-03-14 DIAGNOSIS — Z853 Personal history of malignant neoplasm of breast: Secondary | ICD-10-CM | POA: Diagnosis not present

## 2020-03-14 DIAGNOSIS — R82998 Other abnormal findings in urine: Secondary | ICD-10-CM | POA: Diagnosis not present

## 2020-03-14 DIAGNOSIS — I10 Essential (primary) hypertension: Secondary | ICD-10-CM | POA: Diagnosis not present

## 2020-03-14 DIAGNOSIS — E785 Hyperlipidemia, unspecified: Secondary | ICD-10-CM | POA: Diagnosis not present

## 2020-03-14 DIAGNOSIS — D692 Other nonthrombocytopenic purpura: Secondary | ICD-10-CM | POA: Diagnosis not present

## 2020-03-14 DIAGNOSIS — I129 Hypertensive chronic kidney disease with stage 1 through stage 4 chronic kidney disease, or unspecified chronic kidney disease: Secondary | ICD-10-CM | POA: Diagnosis not present

## 2020-03-14 DIAGNOSIS — R946 Abnormal results of thyroid function studies: Secondary | ICD-10-CM | POA: Diagnosis not present

## 2020-03-14 DIAGNOSIS — I2510 Atherosclerotic heart disease of native coronary artery without angina pectoris: Secondary | ICD-10-CM | POA: Diagnosis not present

## 2020-03-14 DIAGNOSIS — R079 Chest pain, unspecified: Secondary | ICD-10-CM | POA: Diagnosis not present

## 2020-03-14 DIAGNOSIS — R011 Cardiac murmur, unspecified: Secondary | ICD-10-CM | POA: Diagnosis not present

## 2020-03-14 DIAGNOSIS — E039 Hypothyroidism, unspecified: Secondary | ICD-10-CM | POA: Diagnosis not present

## 2020-03-16 ENCOUNTER — Telehealth: Payer: Self-pay | Admitting: *Deleted

## 2020-03-16 NOTE — Telephone Encounter (Signed)
Labs received from [facility] by Dr. Estanislado Pandy Drawn on 03/07/2020  HDL 87 LDL/HDL Ratio 0.8 M-Spike Globulin, Total 2.9  All other labs WNL  We do not have patient on any medications.

## 2020-03-20 ENCOUNTER — Ambulatory Visit (INDEPENDENT_AMBULATORY_CARE_PROVIDER_SITE_OTHER): Payer: Medicare Other

## 2020-03-20 ENCOUNTER — Other Ambulatory Visit: Payer: Self-pay

## 2020-03-20 DIAGNOSIS — J4550 Severe persistent asthma, uncomplicated: Secondary | ICD-10-CM | POA: Diagnosis not present

## 2020-03-20 MED ORDER — DUPILUMAB 300 MG/2ML ~~LOC~~ SOSY
300.0000 mg | PREFILLED_SYRINGE | Freq: Once | SUBCUTANEOUS | Status: AC
  Administered 2020-03-20: 300 mg via SUBCUTANEOUS

## 2020-03-20 NOTE — Progress Notes (Signed)
Have you been hospitalized within the last 10 days?  No Do you have a fever?  No Do you have a cough?  No Do you have a headache or sore throat? No Do you have your Epi Pen visible and is it within date?  Yes 

## 2020-03-22 ENCOUNTER — Telehealth: Payer: Self-pay | Admitting: Internal Medicine

## 2020-03-22 NOTE — Telephone Encounter (Signed)
Dupixent Shipment Received: 300mg  #2 prefilled syringe Medication arrival date: 03/22/20 Lot #: [id number] Exp date: 08/10/2022 Received by: Elliot Dally

## 2020-03-26 ENCOUNTER — Ambulatory Visit: Payer: Medicare Other

## 2020-03-27 ENCOUNTER — Telehealth: Payer: Self-pay | Admitting: Allergy & Immunology

## 2020-03-27 NOTE — Telephone Encounter (Signed)
Sure - OK to schedule per Ernst Bowler.  Salvatore Marvel, MD [facility]

## 2020-03-27 NOTE — Telephone Encounter (Signed)
Patient called and need to be tested to see if she can get the covid shot. Her pharmacy told her to call us. [phone]

## 2020-03-27 NOTE — Telephone Encounter (Signed)
Can you please schedule patient for covid component testing. Thank you

## 2020-03-27 NOTE — Telephone Encounter (Signed)
Spoke with patient, she informed me that she was sent over by her pharmacy who informed her that we did testing for the covid vaccine. I went over the algorithm and patient stated that due to her egg allergy she is not able to take the flu vaccine. She also informed me that when she took the pneumococcal vaccine she developed a large rash on the arm. Patient went to [facility] for rehab, she took miralax and it caused her to vomit for a few days. Patient is wondering if she should get tested before getting the vaccine or if she even needs to take the vaccine. Her primary care physician would like for her to, she stated he wanted her to take the pediatric dose however it is not approved for adults. Please advise.

## 2020-04-03 ENCOUNTER — Other Ambulatory Visit: Payer: Self-pay

## 2020-04-03 ENCOUNTER — Ambulatory Visit (INDEPENDENT_AMBULATORY_CARE_PROVIDER_SITE_OTHER): Payer: Medicare Other

## 2020-04-03 DIAGNOSIS — J4550 Severe persistent asthma, uncomplicated: Secondary | ICD-10-CM | POA: Diagnosis not present

## 2020-04-03 MED ORDER — DUPILUMAB 300 MG/2ML ~~LOC~~ SOSY
300.0000 mg | PREFILLED_SYRINGE | Freq: Once | SUBCUTANEOUS | Status: AC
  Administered 2020-04-03: 300 mg via SUBCUTANEOUS

## 2020-04-03 NOTE — Progress Notes (Signed)
Have you been hospitalized within the last 10 days?  No Do you have a fever?  No Do you have a cough?  No Do you have a headache or sore throat? No Do you have your Epi Pen visible and is it within date?  Yes 

## 2020-04-09 DIAGNOSIS — Z1212 Encounter for screening for malignant neoplasm of rectum: Secondary | ICD-10-CM | POA: Diagnosis not present

## 2020-04-12 ENCOUNTER — Other Ambulatory Visit: Payer: Self-pay | Admitting: Internal Medicine

## 2020-04-17 ENCOUNTER — Ambulatory Visit (INDEPENDENT_AMBULATORY_CARE_PROVIDER_SITE_OTHER): Payer: Medicare Other | Admitting: Family Medicine

## 2020-04-17 ENCOUNTER — Other Ambulatory Visit: Payer: Self-pay

## 2020-04-17 ENCOUNTER — Telehealth: Payer: Self-pay | Admitting: Internal Medicine

## 2020-04-17 ENCOUNTER — Ambulatory Visit: Payer: Medicare Other

## 2020-04-17 ENCOUNTER — Encounter: Payer: Self-pay | Admitting: Family Medicine

## 2020-04-17 VITALS — BP 136/76 | HR 88 | Temp 97.6°F | Resp 17 | Ht 64.5 in | Wt 147.2 lb

## 2020-04-17 DIAGNOSIS — Z888 Allergy status to other drugs, medicaments and biological substances status: Secondary | ICD-10-CM

## 2020-04-17 DIAGNOSIS — T50905A Adverse effect of unspecified drugs, medicaments and biological substances, initial encounter: Secondary | ICD-10-CM | POA: Diagnosis not present

## 2020-04-17 DIAGNOSIS — J4540 Moderate persistent asthma, uncomplicated: Secondary | ICD-10-CM

## 2020-04-17 DIAGNOSIS — T50B95D Adverse effect of other viral vaccines, subsequent encounter: Secondary | ICD-10-CM | POA: Insufficient documentation

## 2020-04-17 NOTE — Addendum Note (Signed)
Addended by: Dara Hoyer on: 04/17/2020 04:08 PM   Modules accepted: Level of Service

## 2020-04-17 NOTE — Progress Notes (Addendum)
[address] [place] [address] Dept: [phone]  New Patient Note  Patient ID: [name], female    DOB: 1932/07/26  Age: 86 y.o. MRN: [id number] Date of Office Visit: 04/17/2020 Referring provider: Lacie Draft, MD [address] [place],  [place] [id number]    Chief Complaint: Allergy Testing (Covid Component Testing)  HPI Larissa Pegg is an 86 year old female who presents to the clinic for COVID vaccine component testing. Her medical history includes coronary artery disease with chronic diastolic congestive heart failure, HTN, PVD, asthma, allergic rhinitis, food allergy, chronic urticaria, angio-edema, and reflux.  She reports that, several years ago she tested positive to egg on a skin prick food allergy test and has been avoiding raw egg since that time. She reports that, for this reason, she has never received an influenza vaccine. She is able to consume cooked eggs at this time with no adverse reactions. She reports that, after hip surgery, she became constipated and was given MiraLax while in the hospital still recovering from the surgery and reports vomiting for 2 days. Lastly, about 4 years ago, she reports large local swelling after receiving pneumovax injection She denies cardiopulmonary or gastrointestinal symptoms. At today's visit, she reports she is feeling well overall. Asthma is reported as well controlled with Symbicort 160-2 puffs once a day, montelukast 10 mg once a day, and infrequent use of albuterol. She continues Dupixent injections once every 2 weeks and has access to an [employer] set. She is followed by Dr. Chase Caller with [facility] for asthma control. Allergic rhinitis is reported as well controlled with Allegra, Nasonex, and ipratropium nasal spray. Reflux is reported as well controlled with esomeprazole twice a day. Her current medications are listed in the chart.  Review of Systems  Constitutional: Negative.   HENT:       Small amount of post  nasal drainage  Eyes: Negative.   Respiratory: Negative.   Cardiovascular: Negative.   Gastrointestinal: Negative.   Genitourinary: Negative.   Musculoskeletal: Negative.   Skin:       Rash noted after several injections that was not accompanied by concomitant cardiopulmonary or gastrointestinal symptoms. No rash at today's visit.   Neurological: Negative.   Endo/Heme/Allergies: Positive for environmental allergies.  Psychiatric/Behavioral: Negative.     Outpatient Encounter Medications as of 04/17/2020  Medication Sig  . acetaminophen (TYLENOL) 500 MG tablet Take 2 tablets (1,000 mg total) by mouth every 8 (eight) hours. (Patient taking differently: Take 1,000 mg by mouth every 6 (six) hours as needed (pain). )  . albuterol (PROAIR HFA) 108 (90 Base) MCG/ACT inhaler Inhale 2 puffs into the lungs every 6 (six) hours as needed for wheezing or shortness of breath.  [name] albuterol (PROVENTIL) (2.5 MG/3ML) 0.083% nebulizer solution Take 3 mLs (2.5 mg total) by nebulization every 6 (six) hours as needed for wheezing or shortness of breath. Dx: J45.41  . benzonatate (TESSALON) 100 MG capsule Take 1 capsule (100 mg total) by mouth 3 (three) times daily as needed for cough.  . budesonide-formoterol (SYMBICORT) 160-4.5 MCG/ACT inhaler Inhale 2 puffs into the lungs 2 (two) times daily.  . Calcium Carbonate-Vitamin D (CALTRATE 600+D) 600-400 MG-UNIT per tablet Take 1 tablet by mouth daily.   . chlorpheniramine (CHLOR-TRIMETON) 4 MG tablet Take 4-8 mg by mouth See admin instructions. 4 mg in the morning and 8 mg at bedtime   . Cholecalciferol (VITAMIN D3) 1000 UNITS tablet Take 1,000 Units by mouth every evening.   [name]  dextromethorphan (DELSYM) 30 MG/5ML liquid Take 60 mg by mouth 2 (two) times daily as needed for cough.  . dextromethorphan-guaiFENesin (MUCINEX DM) 30-600 MG 12hr tablet Take 1 tablet by mouth daily as needed for cough.  . dupilumab (DUPIXENT) 300 MG/2ML prefilled syringe Inject 300 mg into  the skin every 14 (fourteen) days.  . fexofenadine (ALLEGRA) 180 MG tablet Take 180 mg by mouth daily.    . furosemide (LASIX) 20 MG tablet Take 20 mg by mouth daily.  [name] ibandronate (BONIVA) 150 MG tablet Take 1 tablet by mouth every 30 (thirty) days.   [name] ipratropium (ATROVENT) 0.06 % nasal spray Place 2 sprays into both nostrils 2 (two) times daily.  . mometasone (NASONEX) 50 MCG/ACT nasal spray USE 2 SPRAYS NASALLY 2 TIMES DAILY  . montelukast (SINGULAIR) 10 MG tablet TAKE 1 TABLET BY MOUTH EVERY NIGHT AT BEDTIME  . Multiple Vitamin (MULTIVITAMIN WITH MINERALS) TABS tablet Take 1 tablet by mouth daily. Centrum Silver Multivitamin  . Multiple Vitamins-Minerals (PRESERVISION AREDS PO) Take 1 tablet by mouth 2 (two) times daily.  . mupirocin ointment (BACTROBAN) 2 % as needed.   [name] NEXIUM 40 MG capsule TAKE 1 CAPSULE BY MOUTH TWICE A DAY  . potassium chloride (KLOR-CON) 10 MEQ tablet Take 1 tablet (10 mEq total) by mouth daily.  . pramipexole (MIRAPEX) 0.125 MG tablet Take 0.375 mg by mouth at bedtime.   . rosuvastatin (CRESTOR) 5 MG tablet TAKE 1 TABLET BY MOUTH EVERYDAY AT BEDTIME  . Spacer/Aero-Holding Chambers (AEROCHAMBER MV) inhaler by Other route. Use as instructed   . telmisartan (MICARDIS) 20 MG tablet Take 20 mg by mouth daily.   . vitamin B-12 (CYANOCOBALAMIN) 500 MCG tablet Take 500 mcg by mouth daily.    [name] Reasons 20 MG TABS tablet Take 20 mg by mouth daily with supper.   . [DISCONTINUED] gabapentin (NEURONTIN) 300 MG capsule Take 300 mg by mouth daily as needed for pain.   Facility-Administered Encounter Medications as of 04/17/2020  Medication  . omalizumab Arvid Right) injection 300 mg  . omalizumab Arvid Right) injection 300 mg  . omalizumab Arvid Right) injection 300 mg     Drug Allergies:  Allergies  Allergen Reactions  . Egg Phospholipids   . Eggs Or Egg-Derived Products     UNSPECIFIED REACTION  RAW EGGS.. Can eat cooked eggs  . Influenza Vaccines     UNSPECIFIED REACTION    Egg allergy   . Nucala [Mepolizumab]     UNSPECIFIED REACTION   . Cephalosporins Rash  . Codeine Rash  . Gabapentin Nausea Only  . Levofloxacin Rash  . Pneumococcal Vaccines Rash    Family History: Geneveive's family history includes Allergic rhinitis in her brother, mother, and sister; Asthma in her brother, brother, maternal grandmother, mother, and sister; Cancer in her brother; Coronary artery disease in her sister; Deep vein thrombosis in her father; Eczema in her sister; Heart attack in her father and mother; Heart disease in her father, mother, and sister; Heart failure in her father; Hyperlipidemia in her mother; Peripheral vascular disease in her father.   Environmental History: Preslei lives in a house with no concern for mildew or water damage. The flooring is wood throughout. Heating is electric and cooling is via heat pump. No animals are located in or around the home. There is no concern for roaches in the home and the bed is located at least 2 feet off the floor. She is not exposed to cigarette or e-cigatette smoke. She is not currently using  tobacco products or vaping.   Physical Exam: BP 136/76   Pulse 88   Temp 97.6 F (36.4 C) (Temporal)   Resp 17   Ht 5' 4.5" (1.638 m)   Wt 147 lb 4 oz (66.8 kg)   BMI 24.89 kg/m    Physical Exam Vitals reviewed.  Constitutional:      Appearance: Normal appearance.  HENT:     Head: Normocephalic and atraumatic.     Right Ear: Tympanic membrane normal.     Left Ear: Tympanic membrane normal.     Nose:     Comments: Bilateral nares normal. Pharynx slightly erythematous with no exudate. Ears normal. Eyes normal. Eyes:     Conjunctiva/sclera: Conjunctivae normal.  Cardiovascular:     Rate and Rhythm: Normal rate and regular rhythm.     Heart sounds: Normal heart sounds. No murmur heard.   Pulmonary:     Effort: Pulmonary effort is normal.     Breath sounds: Normal breath sounds.     Comments: Lungs clear to  auscultation Musculoskeletal:        General: Normal range of motion.     Cervical back: Normal range of motion and neck supple.  Skin:    General: Skin is warm and dry.  Neurological:     Mental Status: She is alert and oriented to person, place, and time.  Psychiatric:        Mood and Affect: Mood normal.        Behavior: Behavior normal.        Thought Content: Thought content normal.        Judgment: Judgment normal.     Diagnostics: FVC 2.48, FEV1 1.51. Predicted FVC 2.51, predicted FEV1 1.85. Spirometry indicates milk airway obstruction.   Procedure note: Written consent obtained Allergy testing: Covid vaccine component testing   Skin prick testing- Histamine (1.8 mg/mL) puncture:  2+  Control (negative - HSA) puncture:  negative  Triamcinolone puncture:  negative  Methylprednisolone puncture:  negative  Miralax puncture (1:100 or 1.7 mg/mL):  negative  Miralax puncture (1:10 or 17 mg/mL):  negative  Miralax puncture (1:1 or 170 mg/mL):  negative    Intradermal testing- Control (negative - HSA) intradermal:  negative  Triamcinolone (1:100):  negative  Methyprednisolone (1:100):  negative  Triamcinolone (1:10):  negative  Methylprednisolone (1:10):  negative  Triamcinolone (1:1):  negative      Allergy testing results were read and interpreted by provider, documented by clinical staff.     Graded oral challenge to MiraLAX 170mg /ml.  Benefits and risks of challenge discussed and verbal consent obtained.    She was provided with doses equivalent to 1% and 10%. Patient reports intermittent nausea after the dose containing 1%, however, she felt this may be related to dryness in her throat. She preceded with the 10% dose and her nausea became worse, however, she did not vomit. Testing stopped after the second dose of Miralax. She denies cardiopulmonary and integumentary symptoms.  Vital signs taken between doses remained stable. Nausea passed after about 15 minutes.  She  was observed for additional hour after completion of ingestion challenge.  Physical exam remained within normal limits. Vitals  were obtained prior to discharge and remained stable.   Assessment  Assessment and Plan: 1. Adverse effect of other viral vaccines, subsequent encounter   2. Moderate persistent asthma, unspecified whether complicated     Patient Instructions  COVID vaccine component testing. You were able to tolerate skin testing to COVID vaccines  at today's visit. During the ingestion of Miralax portion of the testing, you began to experience nausea without other symptoms of anaphylactic reaction.  This may be related to a side effect of Miralax. Since you did not experience symptoms of anaphylactic reaction, you should be able to tolerate any of the COVID vaccines. However, if you feel concerned about COVID vaccine reactions, you may receive Wynetta Emery and [employer] COVID vaccine which does not contain PEG (the ingredient in Miralax). We have all three of the COVID vaccines available in the clinic on the dates and offices listed below. Just call the clinic and schedule an appointment if you are interested.  Have someone drive you to the vaccine clinic on the day you receive your vaccine Being your EpiPens on the day you are going to receive your COVID vaccine Plan to stay for a full 30 minute observation period after receiving the COVID vaccine.   Call the clinic if this treatment plan is not working well for you  Follow up as needed or sooner if needed.  COVID vaccine clinic dates: 11/10/ [facility] and [facility] (am appointment only) 11/13 [place] 11/15 [place]   Return in about 2 days (around 04/19/2020), or if symptoms worsen or fail to improve.   Thank you for the opportunity to care for this patient.  Please do not hesitate to contact me with questions.

## 2020-04-17 NOTE — Addendum Note (Signed)
Addended by: Herbie Drape on: 04/17/2020 05:42 PM   Modules accepted: Orders

## 2020-04-17 NOTE — Telephone Encounter (Signed)
Called and spoke with Patient. Patient is scheduled 04/24/20 at 1530 for Dupixent injection.

## 2020-04-17 NOTE — Patient Instructions (Addendum)
COVID vaccine component testing. You were able to tolerate skin testing to COVID vaccines at today's visit. During the ingestion of Miralax portion of the testing, you began to experience nausea without other symptoms of anaphylactic reaction.  This may be related to a side effect of Miralax. Since you did not experience symptoms of anaphylactic reaction, you should be able to tolerate any of the COVID vaccines. However, if you feel concerned about COVID vaccine reactions, you may receive Wynetta Emery and [employer] COVID vaccine which does not contain PEG (the ingredient in Miralax). We have all three of the COVID vaccines available in the clinic on the dates and offices listed below. Just call the clinic and schedule an appointment if you are interested.  Have someone drive you to the vaccine clinic on the day you receive your vaccine Being your EpiPens on the day you are going to receive your COVID vaccine Plan to stay for a full 30 minute observation period after receiving the COVID vaccine.   Call the clinic if this treatment plan is not working well for you  Follow up as needed or sooner if needed.  COVID vaccine clinic dates: 11/10/ [facility] (am appointment only) 11/13 [facility] 11/15 [place]

## 2020-04-17 NOTE — Telephone Encounter (Signed)
See note from patient.  She would like to reschedule her appointment.  Wyn Quaker, FNP

## 2020-04-19 ENCOUNTER — Encounter: Payer: Medicare Other | Admitting: Family Medicine

## 2020-04-19 ENCOUNTER — Other Ambulatory Visit: Payer: Self-pay

## 2020-04-19 ENCOUNTER — Encounter: Payer: Medicare Other | Admitting: Allergy

## 2020-04-19 ENCOUNTER — Ambulatory Visit (INDEPENDENT_AMBULATORY_CARE_PROVIDER_SITE_OTHER): Payer: Medicare Other

## 2020-04-19 DIAGNOSIS — Z23 Encounter for immunization: Secondary | ICD-10-CM

## 2020-04-19 NOTE — Progress Notes (Signed)
   Covid-19 Vaccination Clinic  Name:  [name]    MRN: [id number] DOB: 07/23/1932  04/19/2020  [name] was observed post Covid-19 immunization for 30 minutes based on pre-vaccination screening without incident. She was provided with Vaccine Information Sheet and instruction to access the V-Safe system.   [name] was instructed to call 911 with any severe reactions post vaccine: [name] Difficulty breathing  . Swelling of face and throat  . A fast heartbeat  . A bad rash all over body  . Dizziness and weakness   Immunizations Administered    Name Date Dose VIS Date Route   JANSSEN COVID-19 VACCINE 04/19/2020  8:57 AM 0.5 mL 02/29/2020 Intramuscular   Manufacturer: [employer]   Lot: [id number]   [place]: [id number]

## 2020-04-20 ENCOUNTER — Ambulatory Visit: Payer: Medicare Other

## 2020-04-24 ENCOUNTER — Other Ambulatory Visit: Payer: Self-pay

## 2020-04-24 ENCOUNTER — Ambulatory Visit (INDEPENDENT_AMBULATORY_CARE_PROVIDER_SITE_OTHER): Payer: Medicare Other

## 2020-04-24 DIAGNOSIS — J4550 Severe persistent asthma, uncomplicated: Secondary | ICD-10-CM

## 2020-04-24 MED ORDER — DUPILUMAB 300 MG/2ML ~~LOC~~ SOSY
300.0000 mg | PREFILLED_SYRINGE | Freq: Once | SUBCUTANEOUS | Status: AC
  Administered 2020-04-24: 16:00:00 300 mg via SUBCUTANEOUS

## 2020-04-24 NOTE — Progress Notes (Signed)
Have you been hospitalized within the last 10 days?  No Do you have a fever?  No Do you have a cough?  No Do you have a headache or sore throat? No Do you have your Epi Pen visible and is it within date?  Yes 

## 2020-04-27 ENCOUNTER — Telehealth: Payer: Self-pay | Admitting: Internal Medicine

## 2020-04-27 NOTE — Telephone Encounter (Signed)
Dupixent Shipment Received: 300mg  #2 prefilled syringe Medication arrival date: 04/27/20 Lot #: [id number] Exp date: 09/09/2022 Received by: Elliot Dally

## 2020-05-07 ENCOUNTER — Other Ambulatory Visit: Payer: Self-pay | Admitting: Cardiovascular Disease

## 2020-05-08 ENCOUNTER — Ambulatory Visit (INDEPENDENT_AMBULATORY_CARE_PROVIDER_SITE_OTHER): Payer: Medicare Other

## 2020-05-08 ENCOUNTER — Other Ambulatory Visit: Payer: Self-pay

## 2020-05-08 DIAGNOSIS — J4550 Severe persistent asthma, uncomplicated: Secondary | ICD-10-CM

## 2020-05-08 MED ORDER — DUPILUMAB 300 MG/2ML ~~LOC~~ SOSY
300.0000 mg | PREFILLED_SYRINGE | Freq: Once | SUBCUTANEOUS | Status: AC
  Administered 2020-05-08: 15:00:00 300 mg via SUBCUTANEOUS

## 2020-05-08 NOTE — Progress Notes (Signed)
Have you been hospitalized within the last 10 days?  No Do you have a fever?  No Do you have a cough?  No Do you have a headache or sore throat? No Do you have your Epi Pen visible and is it within date?  Yes 

## 2020-05-14 DIAGNOSIS — M25512 Pain in left shoulder: Secondary | ICD-10-CM | POA: Diagnosis not present

## 2020-05-14 DIAGNOSIS — M7532 Calcific tendinitis of left shoulder: Secondary | ICD-10-CM | POA: Diagnosis not present

## 2020-05-22 ENCOUNTER — Ambulatory Visit (INDEPENDENT_AMBULATORY_CARE_PROVIDER_SITE_OTHER): Payer: Medicare Other

## 2020-05-22 ENCOUNTER — Other Ambulatory Visit: Payer: Self-pay

## 2020-05-22 DIAGNOSIS — J4550 Severe persistent asthma, uncomplicated: Secondary | ICD-10-CM | POA: Diagnosis not present

## 2020-05-22 MED ORDER — DUPILUMAB 300 MG/2ML ~~LOC~~ SOSY
300.0000 mg | PREFILLED_SYRINGE | Freq: Once | SUBCUTANEOUS | Status: AC
  Administered 2020-05-22: 300 mg via SUBCUTANEOUS

## 2020-05-22 NOTE — Progress Notes (Signed)
Have you been hospitalized within the last 10 days?  No Do you have a fever?  No Do you have a cough?  No Do you have a headache or sore throat? No Do you have your Epi Pen visible and is it within date?  Yes 

## 2020-06-05 ENCOUNTER — Ambulatory Visit (INDEPENDENT_AMBULATORY_CARE_PROVIDER_SITE_OTHER): Payer: Medicare Other

## 2020-06-05 ENCOUNTER — Other Ambulatory Visit: Payer: Self-pay

## 2020-06-05 DIAGNOSIS — J4550 Severe persistent asthma, uncomplicated: Secondary | ICD-10-CM

## 2020-06-05 MED ORDER — DUPILUMAB 300 MG/2ML ~~LOC~~ SOSY
300.0000 mg | PREFILLED_SYRINGE | Freq: Once | SUBCUTANEOUS | Status: AC
  Administered 2020-06-05: 300 mg via SUBCUTANEOUS

## 2020-06-05 NOTE — Progress Notes (Signed)
Have you been hospitalized within the last 10 days?  No Do you have a fever?  No Do you have a cough?  No Do you have a headache or sore throat? No Do you have your Epi Pen visible and is it within date?  Yes 

## 2020-06-11 ENCOUNTER — Telehealth: Payer: Self-pay | Admitting: Internal Medicine

## 2020-06-11 NOTE — Telephone Encounter (Signed)
Received notification from [place] a prior authorization for [place] 300mg  Syringes. Authorization has been APPROVED from 03/13/20 to 06/11/21.   Authorization # [id number]  Returned patient's call and advised.

## 2020-06-14 ENCOUNTER — Encounter: Payer: Self-pay | Admitting: Cardiovascular Disease

## 2020-06-14 ENCOUNTER — Telehealth: Payer: Self-pay | Admitting: Internal Medicine

## 2020-06-14 NOTE — Progress Notes (Signed)
[name] Date of Birth  1932/12/22       [facility]    [employer] [address], [address], [name], [name]  [id number]   [place], [place]  [address] [phone]     [phone]   Fax  [phone]    Fax [phone]  Problem List: 1. History of chest pain-normal coronary arteries by heart catheterization in August, 2008 2. History of breast cancer-status post right mastectomy and 1989-status post left mastectomy in 1990 3. Hypertension 4. Hyperlipidemia 5. Asthma 6. Chronic diastolic CHF      [name] has had lots of problems with her asthma this summer.  She's not had any cardiac complaints. She denies any chest pain. She's not been able to exercise much of the summer because her asthma problems.  Oct. 23, 2014:  She has had a total knee replacement since I saw her.   She is back exercising some - rehab for her knee.  Has lost a few lbs.   Oct. 27, 2015:  [name] is doing ok from a cardiac standpoint. She's had a wound on the left lower leg which she's been tending to.  She has been going to the wound Center.   [name] history of asthma and has chronic shortness of breath. No angina like pain.    Oct. 27, 2016  Has had lots of breathing issues.  Has had a rough year with her lungs.  Recently had PFTs and was found to her 47% predicted capacity ( on one of the measured functions )  Wakes up with CP on occasion .   Also has some exertional CP .   Sep 12, 2015:  [name] is doing ok from a cardiac standpoint.  Had reported some  chest pain  myoview revealed normal left ventricular systolic function and no ischemia. The echocardiogram revealed normal left ventricle systolic function. She does have grade 1 diastolic dysfunction. She has mild tricuspid regurgitation.  She has not taken her amlodipine this week - due to leg swelling . She wants to keep it on her med list just in case her BP goes up .   She tried Nucala  injections for her asthma.   Did not tolerate it   Has lost 6 lbs   Nov. 2, 2017:  Doing well - she is followed for chronic diastolic congestive heart failure. Has issues with her asthma .  Active but no real exercise   Nov. 8, 2018:     [name] was admitted to the hospital on August 10 after falling and fracturing her left hip.  Nov. 8, 2019:  [name] is seen today for follow-up of her chronic diastolic congestive heart failure.  She has chronic asthma and chronic shortness of breath. Breathing is about the same .   Is on a new injection .  Leg swelling is under control , mild edema today  Had a hip replacement this point   June 20, 2019:  [name] is seen today for a follow-up visit.  She has a history of chest pain  Seems to be doing well. Has been watching her salt.    BP is 146 / 70 Still eating salty foods.   - loves her crackers.    Wakes at at night with occasional CP .   Lasts for a few min. Gets better when she sits up in the side of the bed  Does not get any exercise  Advised her  to reduce her salt intake   Feb. 4, 2022: [name] is seen today for follow up of her chronic diastolic CHF. Has been waking up with CP,  Mid sternal sharp  pain , no radiation ,  Pressure like sensation  Lasts for a few minutes.  Wakes her up .   Has occurred multiple times.     Current Outpatient Medications on File Prior to Visit  Medication Sig Dispense Refill  . acetaminophen (TYLENOL) 500 MG tablet Take 2 tablets (1,000 mg total) by mouth every 8 (eight) hours. 30 tablet 0  . albuterol (PROAIR HFA) 108 (90 Base) MCG/ACT inhaler Inhale 2 puffs into the lungs every 6 (six) hours as needed for wheezing or shortness of breath. 18 g 5  . albuterol (PROVENTIL) (2.5 MG/3ML) 0.083% nebulizer solution Take 3 mLs (2.5 mg total) by nebulization every 6 (six) hours as needed for wheezing or shortness of breath. Dx: J45.41 360 mL 0  . benzonatate (TESSALON) 100 MG capsule Take 1  capsule (100 mg total) by mouth 3 (three) times daily as needed for cough. 100 capsule 1  . budesonide-formoterol (SYMBICORT) 160-4.5 MCG/ACT inhaler Inhale 2 puffs into the lungs daily.    . Calcium Carbonate-Vitamin D 600-400 MG-UNIT tablet Take 1 tablet by mouth daily.    . chlorpheniramine (CHLOR-TRIMETON) 4 MG tablet Take 4-8 mg by mouth See admin instructions. 4 mg in the morning and 8 mg at bedtime    . Cholecalciferol (VITAMIN D3) 1000 UNITS tablet Take 1,000 Units by mouth every evening.     [name] dextromethorphan (DELSYM) 30 MG/5ML liquid Take 60 mg by mouth 2 (two) times daily as needed for cough.    . dextromethorphan-guaiFENesin (MUCINEX DM) 30-600 MG 12hr tablet Take 1 tablet by mouth daily as needed for cough.    . dupilumab (DUPIXENT) 300 MG/2ML prefilled syringe Inject 300 mg into the skin every 14 (fourteen) days. 4 mL 5  . fexofenadine (ALLEGRA) 180 MG tablet Take 180 mg by mouth daily.      . furosemide (LASIX) 20 MG tablet TAKE 1 TABLET (20 MG TOTAL) BY MOUTH DAILY AS NEEDED FOR FLUID OR EDEMA. 90 tablet 0  . ibandronate (BONIVA) 150 MG tablet Take 1 tablet by mouth every 30 (thirty) days.   3  . ipratropium (ATROVENT) 0.06 % nasal spray Place 2 sprays into both nostrils 2 (two) times daily.    . mometasone (NASONEX) 50 MCG/ACT nasal spray USE 2 SPRAYS NASALLY 2 TIMES DAILY 17 g 11  . montelukast (SINGULAIR) 10 MG tablet TAKE 1 TABLET BY MOUTH EVERY NIGHT AT BEDTIME 90 tablet 2  . Multiple Vitamin (MULTIVITAMIN WITH MINERALS) TABS tablet Take 1 tablet by mouth daily. Centrum Silver Multivitamin    . Multiple Vitamins-Minerals (PRESERVISION AREDS PO) Take 1 tablet by mouth 2 (two) times daily.    . mupirocin ointment (BACTROBAN) 2 % as needed.     [name] NEXIUM 40 MG capsule TAKE 1 CAPSULE BY MOUTH TWICE A DAY 180 capsule 1  . potassium chloride (KLOR-CON) 10 MEQ tablet TAKE 1 TABLET BY MOUTH EVERY DAY 90 tablet 0  . pramipexole (MIRAPEX) 0.125 MG tablet Take 0.375 mg by mouth at  bedtime.    . rivaroxaban (XARELTO) 10 MG TABS tablet Take 10 mg by mouth daily.    . rosuvastatin (CRESTOR) 5 MG tablet TAKE 1 TABLET BY MOUTH EVERYDAY AT BEDTIME 90 tablet 0  . Spacer/Aero-Holding Chambers (AEROCHAMBER MV) inhaler by Other route. Use as instructed     .  telmisartan (MICARDIS) 20 MG tablet Take 20 mg by mouth daily.     . vitamin B-12 (CYANOCOBALAMIN) 500 MCG tablet Take 500 mcg by mouth daily.       Current Facility-Administered Medications on File Prior to Visit  Medication Dose Route Frequency Provider Last Rate Last Admin  . omalizumab Arvid Right) injection 300 mg  300 mg Subcutaneous Q14 Days Brand Males, MD   300 mg at 07/27/17 1013  . omalizumab Arvid Right) injection 300 mg  300 mg Subcutaneous Q14 Days Brand Males, MD   300 mg at 08/13/17 0815  . omalizumab Arvid Right) injection 300 mg  300 mg Subcutaneous Q14 Days Brand Males, MD   300 mg at 08/27/17 0950    Allergies  Allergen Reactions  . Egg Phospholipids   . Eggs Or Egg-Derived Products     UNSPECIFIED REACTION  RAW EGGS.. Can eat cooked eggs  . Influenza Vaccines     UNSPECIFIED REACTION  Egg allergy   . Nucala [Mepolizumab]     UNSPECIFIED REACTION   . Cephalosporins Rash  . Codeine Rash  . Gabapentin Nausea Only  . Levofloxacin Rash  . Pneumococcal Vaccines Rash    Past Medical History:  Diagnosis Date  . Anemia    after hysterectomy  . Angio-edema   . Asthma, severe persistent    pulmologist-- dr Lynford Citizen  . Cancer [facility])    Breast cancer on right  . Chronic kidney disease, stage II (mild)   . DDD (degenerative disc disease), cervical   . Diverticulitis   . Diverticulosis yrs ago   hx of   . Edema, lower extremity    occ both legs swell  . GERD (gastroesophageal reflux disease)   . Headache    sinus headaches  . Heart murmur    no problems - per pt  . History of breast cancer 1990 left mastectomy also   1989  S/P   RIGHT MASTECTOMY ;  NO CHEMORADIATION //   NO  RECURRENCE  . History of DVT of lower extremity 5 yrs ago   right leg  . History of shingles   . Hyperlipidemia   . Internal hemorrhoid   . Leg ulcer, left ([place])   . Osteoporosis, unspecified    Knee and hip osteoarthritis bilaterally  . Perennial allergic rhinitis   . Peripheral vascular disease ([place])   . Pneumonia   . Restless legs syndrome (RLS)   . Rheumatoid arthritis ([place])    hands  . Thyroid nodule    followed by dr perrini yearly, no current problam  . Unspecified essential hypertension   . Urticaria     Past Surgical History:  Procedure Laterality Date  . CARDIAC CATHETERIZATION  08/ 01/ 2008   dr Cathie Olden   normal -- EF of 65%  . CARDIOVASCULAR STRESS TEST  05-22-2011   dr Cathie Olden   normal lexiscan perfusion study/  no ischemia/  lvsf  86%  . CATARACT EXTRACTION W/ INTRAOCULAR LENS  IMPLANT, BILATERAL    . CHOLECYSTECTOMY  1993   laparoscopic  . CONVERSION TO TOTAL HIP Left 12/03/2017   Procedure: CONVERSION TO LEFT TOTAL HIP;  Surgeon: Paralee Cancel, MD;  Location: [facility];  Service: Orthopedics;  Laterality: Left;  90 mins  . HIP ARTHROPLASTY Left 12/22/2016   Procedure: HEMIARTHROPLASTY, LEFT;  Surgeon: Paralee Cancel, MD;  Location: [facility];  Service: Orthopedics;  Laterality: Left;  . INCISION AND DRAINAGE OF WOUND Left 10/24/2013   Procedure: IRRIGATION AND DEBRIDEMENT OF LEFT LEG WITH  PLACEMENT OF ACELL AND WOUND VAC;  Surgeon: Theodoro Kos, DO;  Location: [facility];  Service: Plastics;  Laterality: Left;  . KNEE ARTHROPLASTY    . [place]  . MASTECTOMY Bilateral rigth 1989///   left  1990   breast cancer 1989//   fibrocytic disease 1990  . ORIF HUMERUS FRACTURE Right 09/21/2018   Procedure: OPEN REDUCTION INTERNAL FIXATION (ORIF) PROXIMAL HUMERUS FRACTURE;  Surgeon: Nicholes Stairs, MD;  Location: [facility];  Service: Orthopedics;  Laterality: Right;  90 mins  . TOTAL ABDOMINAL HYSTERECTOMY W/ BILATERAL  SALPINGOOPHORECTOMY  1989   done at same time as right mastectomy  . TOTAL KNEE ARTHROPLASTY Right 10/05/2012   Procedure: RIGHT TOTAL KNEE ARTHROPLASTY;  Surgeon: Mauri Pole, MD;  Location: [facility];  Service: Orthopedics;  Laterality: Right;    Social History   Tobacco Use  Smoking Status Never Smoker  Smokeless Tobacco Never Used    Social History   Substance and Sexual Activity  Alcohol Use No    Family History  Problem Relation Age of Onset  . Heart attack Mother   . Asthma Mother   . Heart disease Mother   . Hyperlipidemia Mother   . Allergic rhinitis Mother   . Heart attack Father   . Heart failure Father   . Deep vein thrombosis Father   . Heart disease Father   . Peripheral vascular disease Father        amputation  . Asthma Brother   . Allergic rhinitis Brother   . Asthma Sister   . Eczema Sister   . Allergic rhinitis Sister   . Cancer Brother   . Asthma Brother   . Coronary artery disease Sister   . Heart disease Sister   . Asthma Maternal Grandmother   . Urticaria Neg Hx     Reviw of Systems:  Reviewed in the HPI.  All other systems are negative.    ECG:    Assessment / Plan:  : 1. Chest discomfort:  [name]'s been having some episodes of chest discomfort that wake her up.  These episodes could be due to angina.  We will schedule her for a Lexiscan Myoview study.  We will give her a prescription for nitroglycerin to take on an as-needed basis.  2. Chronic diastolic congestive heart failure:    She is not having any significant episodes of chest discomfort.  3. Dyspnea:        Mertie Moores, MD  06/15/2020 9:57 AM    [facility] [place],  [place],   [address] Pager [phone] Phone: [phone]; Fax: [phone]

## 2020-06-14 NOTE — Telephone Encounter (Signed)
Dupixent Shipment Received: 300mg  #2 prefilled syringe Medication arrival date: 06/14/20 Lot #: [id number] Exp date: 10/10/2022 Received by: Elliot Dally

## 2020-06-15 ENCOUNTER — Other Ambulatory Visit: Payer: Self-pay

## 2020-06-15 ENCOUNTER — Encounter: Payer: Self-pay | Admitting: Cardiovascular Disease

## 2020-06-15 ENCOUNTER — Ambulatory Visit (INDEPENDENT_AMBULATORY_CARE_PROVIDER_SITE_OTHER): Payer: Medicare Other | Admitting: Cardiovascular Disease

## 2020-06-15 VITALS — BP 146/70 | HR 78 | Ht 64.5 in | Wt 146.6 lb

## 2020-06-15 DIAGNOSIS — R079 Chest pain, unspecified: Secondary | ICD-10-CM

## 2020-06-15 DIAGNOSIS — I200 Unstable angina: Secondary | ICD-10-CM

## 2020-06-15 DIAGNOSIS — R072 Precordial pain: Secondary | ICD-10-CM

## 2020-06-15 MED ORDER — NITROGLYCERIN 0.4 MG SL SUBL: 0.4000 mg | SUBLINGUAL_TABLET | SUBLINGUAL | 3 refills | Status: AC | PRN

## 2020-06-15 NOTE — Patient Instructions (Addendum)
Medication Instructions:  Your physician has recommended you make the following change in your medication:  START: 1.Nitroglycerin 0.4mg  as needed  *If you need a refill on your cardiac medications before your next appointment, please call your pharmacy*   Lab Work: none If you have labs (blood work) drawn today and your tests are completely normal, you will receive your results only by: [name] MyChart Message (if you have MyChart) OR . A paper copy in the mail If you have any lab test that is abnormal or we need to change your treatment, we will call you to review the results.   Testing/Procedures: Due next week per Dr. Acie Fredrickson Your physician has requested that you have a lexiscan myoview. For further information please visit [email or web address]. Please follow instruction sheet, as given.    Follow-Up: At [facility], you and your health needs are our priority.  As part of our continuing mission to provide you with exceptional heart care, we have created designated Provider Care Teams.  These Care Teams include your primary Cardiologist (physician) and Advanced Practice Providers (APPs -  Physician Assistants and Nurse Practitioners) who all work together to provide you with the care you need, when you need it.  Your next appointment:   3 month(s)  The format for your next appointment:   In Person  Provider:   You will see one of the following Advanced Practice Providers on your designated Care Team:    Richardson Dopp, PA-C  [place], [place]

## 2020-06-19 ENCOUNTER — Other Ambulatory Visit: Payer: Self-pay

## 2020-06-19 ENCOUNTER — Ambulatory Visit (INDEPENDENT_AMBULATORY_CARE_PROVIDER_SITE_OTHER): Payer: Medicare Other

## 2020-06-19 DIAGNOSIS — J4550 Severe persistent asthma, uncomplicated: Secondary | ICD-10-CM

## 2020-06-19 MED ORDER — DUPILUMAB 300 MG/2ML ~~LOC~~ SOSY
300.0000 mg | PREFILLED_SYRINGE | Freq: Once | SUBCUTANEOUS | Status: AC
  Administered 2020-06-19: 300 mg via SUBCUTANEOUS

## 2020-06-19 NOTE — Progress Notes (Signed)
Have you been hospitalized within the last 10 days?  No Do you have a fever?  No Do you have a cough?  No Do you have a headache or sore throat? No  

## 2020-06-20 ENCOUNTER — Telehealth (HOSPITAL_COMMUNITY): Payer: Self-pay | Admitting: *Deleted

## 2020-06-20 NOTE — Telephone Encounter (Signed)
Close encounter 

## 2020-06-21 ENCOUNTER — Ambulatory Visit (HOSPITAL_COMMUNITY)
Admission: RE | Admit: 2020-06-21 | Discharge: 2020-06-21 | Disposition: A | Discharge status: Home / Self Care | Payer: Medicare Other | Source: Ambulatory Visit | Attending: Cardiovascular Disease | Admitting: Cardiovascular Disease

## 2020-06-21 ENCOUNTER — Other Ambulatory Visit: Payer: Self-pay

## 2020-06-21 DIAGNOSIS — R072 Precordial pain: Secondary | ICD-10-CM | POA: Diagnosis not present

## 2020-06-21 LAB — MYOCARDIAL PERFUSION IMAGING
LV dias vol: 72 mL (ref 46–106)
LV sys vol: 24 mL
Peak HR: 89 {beats}/min
Rest HR: 83 {beats}/min
SDS: 3
SRS: 5
SSS: 8
TID: 1.16

## 2020-06-21 MED ORDER — TECHNETIUM TC 99M TETROFOSMIN IV KIT
30.8000 | PACK | Freq: Once | INTRAVENOUS | Status: AC | PRN
  Administered 2020-06-21: 30.8 via INTRAVENOUS
  Filled 2020-06-21: qty 31

## 2020-06-21 MED ORDER — TECHNETIUM TC 99M TETROFOSMIN IV KIT
10.3000 | PACK | Freq: Once | INTRAVENOUS | Status: AC | PRN
  Administered 2020-06-21: 10.3 via INTRAVENOUS
  Filled 2020-06-21: qty 11

## 2020-06-21 MED ORDER — REGADENOSON 0.4 MG/5ML IV SOLN
0.4000 mg | Freq: Once | INTRAVENOUS | Status: AC
  Administered 2020-06-21: 0.4 mg via INTRAVENOUS

## 2020-06-21 MED ORDER — AMINOPHYLLINE 25 MG/ML IV SOLN
75.0000 mg | Freq: Once | INTRAVENOUS | Status: AC
  Administered 2020-06-21: 75 mg via INTRAVENOUS

## 2020-06-28 DIAGNOSIS — L7211 Pilar cyst: Secondary | ICD-10-CM | POA: Diagnosis not present

## 2020-06-28 DIAGNOSIS — L720 Epidermal cyst: Secondary | ICD-10-CM | POA: Diagnosis not present

## 2020-06-28 DIAGNOSIS — D485 Neoplasm of uncertain behavior of skin: Secondary | ICD-10-CM | POA: Diagnosis not present

## 2020-07-03 ENCOUNTER — Telehealth: Payer: Self-pay | Admitting: Internal Medicine

## 2020-07-03 ENCOUNTER — Other Ambulatory Visit: Payer: Self-pay

## 2020-07-03 ENCOUNTER — Ambulatory Visit (INDEPENDENT_AMBULATORY_CARE_PROVIDER_SITE_OTHER): Payer: Medicare Other

## 2020-07-03 DIAGNOSIS — J4550 Severe persistent asthma, uncomplicated: Secondary | ICD-10-CM | POA: Diagnosis not present

## 2020-07-03 MED ORDER — DUPILUMAB 300 MG/2ML ~~LOC~~ SOSY: 300.0000 mg | PREFILLED_SYRINGE | SUBCUTANEOUS | 5 refills | Status: DC

## 2020-07-03 MED ORDER — DUPILUMAB 300 MG/2ML ~~LOC~~ SOSY
300.0000 mg | PREFILLED_SYRINGE | Freq: Once | SUBCUTANEOUS | Status: AC
  Administered 2020-07-03: 300 mg via SUBCUTANEOUS

## 2020-07-03 NOTE — Telephone Encounter (Signed)
Pt is needing a new prescription for dupixent. Pharmacy; [facility]; mail order. [facility] is requesting that they be called to arrange for a scheduling for order.  [place] regard [phone] (pt). [phone] (Pharmacy)

## 2020-07-03 NOTE — Progress Notes (Signed)
Have you been hospitalized within the last 10 days?  No Do you have a fever?  No Do you have a cough?  No Do you have a headache or sore throat? No Do you have your Epi Pen visible and is it within date?  Yes 

## 2020-07-03 NOTE — Telephone Encounter (Signed)
Noted. Will route to injection pool.

## 2020-07-03 NOTE — Telephone Encounter (Signed)
[facility]upixent prescription sent to [facility].  Patient orders and has Dupixent delivered to office.

## 2020-07-04 ENCOUNTER — Telehealth: Payer: Self-pay | Admitting: Internal Medicine

## 2020-07-11 NOTE — Telephone Encounter (Signed)
Dupixent Shipment Received: 300mg  #2 prefilled syringe Medication arrival date: 07/11/20 Lot #: [id number] Exp date: 12/10/2022 Received by: Elliot Dally

## 2020-07-17 ENCOUNTER — Ambulatory Visit (INDEPENDENT_AMBULATORY_CARE_PROVIDER_SITE_OTHER): Payer: Medicare Other

## 2020-07-17 ENCOUNTER — Other Ambulatory Visit: Payer: Self-pay

## 2020-07-17 DIAGNOSIS — J4550 Severe persistent asthma, uncomplicated: Secondary | ICD-10-CM | POA: Diagnosis not present

## 2020-07-17 MED ORDER — DUPILUMAB 300 MG/2ML ~~LOC~~ SOSY
300.0000 mg | PREFILLED_SYRINGE | Freq: Once | SUBCUTANEOUS | Status: AC
Start: 2020-07-17 — End: 2020-07-17
  Administered 2020-07-17: 300 mg via SUBCUTANEOUS

## 2020-07-17 NOTE — Progress Notes (Signed)
Have you been hospitalized within the last 10 days?  No Do you have a fever?  No Do you have a cough?  No Do you have a headache or sore throat? No Do you have your Epi Pen visible and is it within date?  Yes   Discusses home injection with Patient. Patient shown how to do injection with Dupixent demo pen. Patient stated she would follow up with Dr. Chase Caller 08/08/20. Patient stated she would discuss with Dr. Chase Caller at [facility].

## 2020-07-18 ENCOUNTER — Other Ambulatory Visit (HOSPITAL_COMMUNITY): Payer: Self-pay | Admitting: Pharmacy Technician

## 2020-07-27 DIAGNOSIS — Z96651 Presence of right artificial knee joint: Secondary | ICD-10-CM | POA: Diagnosis not present

## 2020-07-27 DIAGNOSIS — M25561 Pain in right knee: Secondary | ICD-10-CM | POA: Diagnosis not present

## 2020-07-27 DIAGNOSIS — M24811 Other specific joint derangements of right shoulder, not elsewhere classified: Secondary | ICD-10-CM | POA: Diagnosis not present

## 2020-07-27 DIAGNOSIS — M25511 Pain in right shoulder: Secondary | ICD-10-CM | POA: Diagnosis not present

## 2020-08-01 ENCOUNTER — Other Ambulatory Visit: Payer: Self-pay

## 2020-08-01 ENCOUNTER — Ambulatory Visit (INDEPENDENT_AMBULATORY_CARE_PROVIDER_SITE_OTHER): Payer: Medicare Other

## 2020-08-01 VITALS — BP 135/69 | HR 85 | Temp 98.2°F | Resp 20

## 2020-08-01 DIAGNOSIS — J4550 Severe persistent asthma, uncomplicated: Secondary | ICD-10-CM

## 2020-08-01 MED ORDER — DUPILUMAB 300 MG/2ML ~~LOC~~ SOSY
300.0000 mg | PREFILLED_SYRINGE | Freq: Once | SUBCUTANEOUS | Status: AC
  Administered 2020-08-01: 300 mg via SUBCUTANEOUS
  Filled 2020-08-01: qty 2

## 2020-08-01 MED ORDER — ALBUTEROL SULFATE HFA 108 (90 BASE) MCG/ACT IN AERS: 2.0000 | INHALATION_SPRAY | Freq: Once | RESPIRATORY_TRACT | Status: DC | PRN

## 2020-08-01 MED ORDER — METHYLPREDNISOLONE SODIUM SUCC 125 MG IJ SOLR: 125.0000 mg | Freq: Once | INTRAMUSCULAR | Status: DC | PRN

## 2020-08-01 MED ORDER — EPINEPHRINE 0.3 MG/0.3ML IJ SOAJ: 0.3000 mg | Freq: Once | INTRAMUSCULAR | Status: DC | PRN

## 2020-08-01 MED ORDER — SODIUM CHLORIDE 0.9 % IV SOLN: Freq: Once | INTRAVENOUS | Status: DC | PRN

## 2020-08-01 MED ORDER — FAMOTIDINE IN NACL 20-0.9 MG/50ML-% IV SOLN: 20.0000 mg | Freq: Once | INTRAVENOUS | Status: DC | PRN

## 2020-08-01 MED ORDER — DIPHENHYDRAMINE HCL 50 MG/ML IJ SOLN: 50.0000 mg | Freq: Once | INTRAMUSCULAR | Status: DC | PRN

## 2020-08-01 NOTE — Progress Notes (Signed)
Diagnosis: Asthma  {Provider:Mannam Praveen   Procedure: Injection, Medication: Dupixent, Site: subcutaneous by Arnoldo Morale, RN  Discharge: Condition: Good, Destination: Home . AVS provided. by Arnoldo Morale, RN

## 2020-08-06 ENCOUNTER — Other Ambulatory Visit: Payer: Self-pay | Admitting: Pharmacist

## 2020-08-06 DIAGNOSIS — M25811 Other specified joint disorders, right shoulder: Secondary | ICD-10-CM | POA: Diagnosis not present

## 2020-08-06 DIAGNOSIS — S42254S Nondisplaced fracture of greater tuberosity of right humerus, sequela: Secondary | ICD-10-CM | POA: Diagnosis not present

## 2020-08-06 DIAGNOSIS — J4550 Severe persistent asthma, uncomplicated: Secondary | ICD-10-CM

## 2020-08-06 MED ORDER — DUPIXENT 300 MG/2ML ~~LOC~~ SOAJ: 300.0000 mg | SUBCUTANEOUS | 0 refills | Status: DC

## 2020-08-06 NOTE — Telephone Encounter (Signed)
Rx changed to [facility]. Anticipated shipment date to clinic is 08/09/20. Verbal rx provided for 4 ml (2 pens) to be use for training.

## 2020-08-08 ENCOUNTER — Telehealth: Payer: Self-pay | Admitting: Pharmacy Technician

## 2020-08-08 ENCOUNTER — Encounter: Payer: Self-pay | Admitting: Internal Medicine

## 2020-08-08 ENCOUNTER — Ambulatory Visit (INDEPENDENT_AMBULATORY_CARE_PROVIDER_SITE_OTHER): Payer: Medicare Other | Admitting: Internal Medicine

## 2020-08-08 ENCOUNTER — Other Ambulatory Visit: Payer: Self-pay

## 2020-08-08 VITALS — BP 118/80 | HR 83 | Ht 65.0 in | Wt 145.2 lb

## 2020-08-08 DIAGNOSIS — I200 Unstable angina: Secondary | ICD-10-CM | POA: Diagnosis not present

## 2020-08-08 DIAGNOSIS — J4551 Severe persistent asthma with (acute) exacerbation: Secondary | ICD-10-CM | POA: Diagnosis not present

## 2020-08-08 MED ORDER — ALBUTEROL SULFATE HFA 108 (90 BASE) MCG/ACT IN AERS: 2.0000 | INHALATION_SPRAY | Freq: Four times a day (QID) | RESPIRATORY_TRACT | 5 refills | Status: DC | PRN

## 2020-08-08 MED ORDER — PREDNISONE 10 MG PO TABS: ORAL_TABLET | ORAL | 0 refills | Status: AC

## 2020-08-08 NOTE — Telephone Encounter (Signed)
Verbal prescription for Dupixent sent [facility]. Expected shipment to clinic is 08/09/20. ATC patient to re-schedule her appointment - current appointment is scheduled with infusion team and will need to be cancelled once she's scheduled on my calendar  Knox Saliva, PharmD, MPH Clinical Pharmacist (Rheumatology and Pulmonology)

## 2020-08-08 NOTE — Patient Instructions (Addendum)
ICD-10-CM   1. Severe persistent asthma with acute exacerbation  J45.51     - in flare up due to pollen - does not meet admission criteria   Plan  - continue dupixent,  and singulair and Symbicort  But go back to Symbicort  160/4.5 at 2 puff tiwce daily (double up)  -Continue albuterol as needed  = work with CMA to get dupixent changed to outpatient home use - Take prednisone 40 mg daily x 2 days, then 20mg  daily x 2 days, then 10mg  daily x 2 days, then 5mg  daily x 2 days and stop  Follow-up 7-14 days with app - tele or face to face to ensure you are better 6 months with Dr Chase Caller

## 2020-08-08 NOTE — Progress Notes (Signed)
C.C.:  Follow-up for Severe, Persistent Asthma, Chronic Allergic Rhinitis, DVT, & GERD w/ Hiatal Hernia.  HPI Patient has been seen twice in our office since her last appointment with me. Last visit was on 9/26 with nurse practitioner. Patient was treated for an exacerbation of her asthma with bronchitis. She was given a prednisone taper, instructed to use Mucinex twice daily as needed, and a 7 day course of Augmentin. Since her last appointment she fractured her left hip. She is continuing to walk with a cane. She reports no adverse effects from her Xolair. She does feel the Xolair is helping her significantly. Hasn't needed her rescue inhaler significantly in the last few weeks. Only rare nocturnal awakenings with any dyspnea.   Severe, persistent asthma: Patient currently maintained on a regimen of Xolair, Singulair, Spiriva, and Symbicort. She feels her dyspnea has significantly improved. She still has a scratchy throat & hoarse voice. She has her baseline nonproductive cough. No wheezing.  Chronic allergic rhinitis: Previously referred to ENT. Regimen at last appointment with me included Allegra, Singulair, Nasonex, and Xolair. Patient has had multiple different antibiotic courses for recurrent sinusitis. Only having intermittent sinus congestion & drainage.   DVT: Present in bilateral lower extremities. Patient on systemic anticoagulation with Xarelto indefinitely. No melena or hematochezia. No hematuria.   GERD with hiatal hernia: Previously prescribed Nexium. No reflux or dyspepsia. No morning brash water taste.   Review of Systems  No chest pain or tightness. No fever or chills. No abdominal pain or nausea.    OV 06/15/2017  Chief Complaint  Patient presents with  . Follow-up    flare ups with asthma when the weather changes.  Right now it is a cough, with congestions and wheezing. Non-productive dry cough. SOB with any activity.  Hoarseness.   87 year old female.  Transfer of  care from Dr. Ashok Cordia who is left the practice.  Moderate persistent asthma with an elevated IgE.  She is here with her husband.  She tells me that in 2015-2017 she was started on Xolair and then subsequently switched in [place] last year or year before last.  This then caused nonspecific side effect such as erythema in her face and nonspecific pain that reminded her of shingles.  Therefore she discontinued this and the side effects resolved.  She did have 6 doses of the interleukin-5 receptor antibody.  She went back on Xolair.  Overall she feels that since starting Xolair her quality of life and asthma exacerbations have improved but nevertheless she still gets asthma exacerbations every few months and has to go on prednisone.  Currently she states that for the last few to several days she has had increased cough although no change in sputum.  She is also wheezing a little bit more than usual.  Asthma control questionnaire shows for the last week she is waking up a few times at night.  When she wakes up she has moderate symptoms.  Her activities are slightly limited because of asthma.  She is moderate amount of shortness of breath because of asthma and she is wheezing a little of the time and using albuterol for rescue at least 1 or 2 times daily.  Exam nitric oxide is significantly elevated at 125 ppb.  She think she will benefit from prednisone  She she reports to me for the first time a new issue of left anterior cervical lymph node/submandibular lymph node.  She says it is been present for a while but other physicians  were examined it have not felt it.  She asked me to examine it.  She feels it is slowly growing.  Insidious onset for a year.   OV 08/13/2017  Chief Complaint  Patient presents with  . Follow-up    Pt states she has had many flare-ups with her asthma. States she has had a lot of sinus drainage and congestion and a lot of chest tightness.    87 year old female with complicated asthma    poor control of asthma continues.  At my last visit she did a prednisone taper.  Then a few weeks ago her sinuses acted up and therefore her asthma acted up and she did another prednisone taper on herself at home but it was just for a few days.  She continues on Spiriva, Symbicort, Singulair, Nexium and Xolair injections.  She feels that ever since Dr. Asencion Noble retired her asthma has been out of control.  Currently for the last few weeks her sinuses have been acting up and she wants antibiotics.  In particular she wants Augmentin.  She says in the past this was deemed as an allergy but she went and saw some doctor in [place] and it looks like she has been desensitized.  Penicillin is no longer listed as an allergy.  She insists that she wants Augmentin.  Review of the chart shows she has had chronic sinusitis and a CT scan of the sinus 1 year ago in February 2018.  She says she has seen ENT for this.  No surgery has been recommended.  Just nasal hygiene and antibiotic courses.  Currently her symptoms are quite significant with asthma control question of greater than 2 it appears this might all just be her baseline.  Her exam nitric oxide is still high over 100.  At night she is waking up several times.  When she wakes up she has moderate symptoms.  Activities are slightly limited because of asthma.  She has moderate amount of shortness of breath because of asthma.  She is wheezing hardly and she uses albuterol for rescue at least 1 or 2 times daily.   OV  Chief Complaint  Patient presents with  . Follow-up    Pt states her breathing has been much better since she began new injection but states she is coughing more than before. Denies any CP.    Follow-up severe persistent asthma with eosinophilia: In April 2019 we started dupulimab and stop the Xolair. With this asthma control is significantly better. Her asthma control questionnaire average score has drop to 0.6. Her exhaled nitric oxide is  drop from 100s to 43. She says she is much less short of breath and is able to do a lot more things. She is very pleased with the new injectable. However she feels that her baseline chronic cough is slightly worse. It is moderate in intensity is dry. It does not wake up at night. It is not associated with wheezing or shortness of breath. It is associated with Spiriva MDI. She does clear her throat. Coughing is made worse by talking. Off note, she has new issue of left hip surgery coming up in 2 days. She feels from a pulmonary standpoint she will be stable to do the surgery because of prior experience with surgery.        OV 03/08/2018  Subjective:  Patient ID: [name], female , DOB: December 22, 1932 , age 3 y.o. , MRN: [id number] , ADDRESS: [address] [place] [address]  03/08/2018 -   Chief Complaint  Patient presents with  . Follow-up    Sinus draniage and more cough    HPI [name] 87 y.o. -returns for asthma follow-up.  However the issue today is that she reports worsening of sinus drainage for the last 1 month ever since the fall weather change.  He says the sinus drainage is so significant that it is making her cough.  She feels asthma itself is under control.  In fact exam nitric oxide today is 38.  She is continuing inhalers Singulair Nexium and injection dupilumab.  She refused flu shot because she is allergic to it.  Review of the chart indicates March 2018 Dr. Ashok Cordia did a CT scan of the sinus and it showed pansinusitis.  She is says she is seen ENT from 2 years ago and is unclear if it was before the scan or after the scan.  At this point in time she just wants relief from her current sinus issue.  The sinus drainage is possibly clear but she has not looked at it.  There is no fever worsening wheeze or chest tightness.  05/17/2018 Patient presents today with acute complaints of cough and congestion. Accompanied by husband. Productive cough with clear mucus since  05/05/18. Husband and brother were both sick. She was seen at [facility] on 12/29 and given steroid injection and amoxicillin course. Finished antibiotic 2 days ago. Did not notice much of an improvement, if any. She has taken mucinex and delsym for cough. Hasn't had to use rescue inhaler or nebulizer. Due for Dupixent injection on 05/25/18. Did not receive flu injection d/t allergy. Afebrile.   05/25/2018 Patient presents for 1 week follow-up for asthma exacerbation. CXR showed no acute cardiopulmonary disease. Treated with prednisone taper. Due for dupixent injection today. Feeling better than she did last week, still has a cough but sputum has cleared. Felt better yesterday, rain made her symptoms worse. States anytime the weather changes her asthma flares. Using nebulizer as needed, didn't use it as much yesterday. Required a treatment this morning. Takes Singulair and allegra. Afebrile.       OV 06/08/2018  Subjective:  Patient ID: [name], female , DOB: 01-26-1933 , age 39 y.o. , MRN: [id number] , ADDRESS: [place] [address]   06/08/2018 -   Chief Complaint  Patient presents with  . Follow-up    Pt states she has had sinus problems, cough with clear mucus, and has also had some chest pain/tightness. Pt has been seen by Derl Barrow for asthma exacerbations a couple different times. Pt denies any fever.   Severe asthma on Dupixent.  Also with chronic sinusitis  HPI [name] 87 y.o. -returns for follow-up of her issues.  I last saw her in October 2019.  After that earlier this month she came to see nurse practitioner 2 times for acute on chronic sinus issues.  She tells me that she is tolerating her Dupixent injections just fine.  She feels it is helping her.  However, she is bothered by sinus issues this constantly sinus drainage particularly flared up in January 2020.  She recollects seeing Dr. Redmond Baseman in ENT.  At the time we recommended amoxicillin but she was  allergic to it and no antibiotic therefore was prescribed according to history.  Since then she has seen an allergist in her local area and is been cleared to get amoxicillin.  She is okay seeing her ENT but she wants to change to  her husband's ENT surgeon Dr. Elgie Congo.  Early this month with a sinus flush she got antibiotic and prednisone this helped her.  However she still has significant amount of residual symptoms.  Asthma control question is 2.8 but the nitric oxide level is normal.  This was excessive symptom burden.  She is waking up several times at night.  When she wakes up she has moderate symptoms she is moderately limited in her activities.  Moderate amount of shortness of breath and wheezing a little of the time and using albuterol for rescue at least 1 time daily.  She says is unimproved symptomatology because of the recent antibiotic and prednisone.  She feels she needs another course of antibiotic and prednisone as well.  In addition with the cough she is complaining of musculoskeletal pain in the upper chest and the back.  She feels the lungs are hurting.  There is no radiation.  The course is improving.  Of note, her past medical history lists her as having rheumatoid arthritis but she denies that to be the case.  She says she has osteoarthritis.  She does not see a rheumatologist.  She has had no prior immune work-up.    OV 02/28/2019  Subjective:  Patient ID: [name], female , DOB: Mar 29, 1933 , age 54 y.o. , MRN: [id number] , ADDRESS: [place] [id number]   02/28/2019 -   Chief Complaint  Patient presents with  . Follow-up    She reports her breathing has been at her baseline.Allergy to eggs, no flu shot. Reports her dupixent has been great.      HPI Kaylea Buffalo 87 y.o. -  Follow-up chronic severe persistent asthma: She is now on Dupixent, Symbicort, Spiriva and Singulair.  She says her asthma is well under control.  Albuterol rescue use is rare.   No nocturnal awakenings.  She has deferred flu shot because she states "I cannot take it"   Follow-up chronic sinusitis: She has seen ENT Dr. Lorelee Cover and has been prescribed Nasonex.  This is now under control.  New issue of ANA positivity 1: 1280 -in January 2020 because of chronic sinusitis I have ordered autoimmune and vasculitis profile.  Her ANCA profile is negative.  However her ANA is strongly positive.  She admits to having osteoarthritis and osteoporosis.  She denies she has rheumatoid arthritis although this is mentioned in her chart as past medical history.  She denies having seen a rheumatologist.  Review of the chart does not indicate she has had rheumatoid factor tested with an health system.    ROS - per HPI   OV 07/11/2019  Subjective:  Patient ID: [name], female , DOB: 05-Mar-1933 , age 47 y.o. , MRN: [id number] , ADDRESS: [place] [id number]   07/11/2019 -   Chief Complaint  Patient presents with  . Follow-up    Pt states she has been doing good since last visit. States she does have an occ cough.     HPI [name] 87 y.o. -  Follow-up chronic severe asthma: Last seen in October 2020.  Since then she continues with her durvalumab on a scheduled basis.  She visits our office for that.  However she has become less compliant with the Symbicort.  She for the month of February 2021 she is only taken it 2 times.  She also takes Spiriva only rarely.  She takes these 2 on a as needed basis.  She is compliant with her Singulair  on a daily basis.  Albuterol is also as needed.  Today because of the weather changes rainy and warm she feels a remote tightness in her chest.  We do not have exam nitric oxide test.  Asthma control question is 0.8 and is at the upper limit of good control.  She feels the Dupixent has worked tremendously well for her  In terms of her chronic sinusitis: She follows with ENT and takes Nasonex   In terms of her strong positivity  for ANA: Last visit check extended autoimmune profile.  SSA was positive.  She then had a televisit in December 2020 with Dr. Keturah Barre.  I reviewed that note.  She has a face-to-face visit coming up in March 2021.  She does admit to dryness of the eyes.  She might have Sjogren's and I informed this to her.   In terms of vaccine counseling.  She has had allergy to flu shot.  She has other allergies documented below.  She does not want to do the COVID-19 vaccine.  She is social distancing.  She does outdoor church.  She wants to know when life will return to normal.  We discussed summer is a possibility.   OV 01/10/2020   Subjective:  Patient ID: [name], female , DOB: 05-31-1932, age 45 y.o. years. , MRN: [id number],  ADDRESS: [address] [place] [address] PCP  Crist Infante, MD Providers : Treatment Team:  Attending Provider: Brand Males, MD   Chief Complaint  Patient presents with  . Follow-up    Severe Asthma       HPI [name] 87 y.o. -returns for follow-up of asthma.  Since her last visit she still remains Covid unvaccinated.  She is scared of the Covid vaccine because of egg allergy.  Because of egg allergy she normally does not do flu shot.  She has done other vaccines.  Asthma continues to be stable.  She is taking Dupixent at full dose every 2 weeks in our office.  She does not want to do this at home.  She does not want to reduce the dose.  She also takes Symbicort at 160/4.52 puff 2 times daily.  She is willing to reduce the dose for this ACT control score is 19 showing good control.  She recently relocated her sister who is 64 years old and has been doing chores.  She is trying to enjoy her vacation in the mountains in  [place].  Of note even though ACT score is 19 and suggest poor control she tells me she barely ever wakes up in the middle of the night from asthma.  She is not using her albuterol for rescue.  She feels asthma is well  controlled.    Asthma Control Panel  10/11/15 - IgE - 425 , rest of blood allergy panel negative. Blood eos 300cells (al;so 300 on 12/19/16). CT Sinus feb 2018 - chronic pan sinusitis     07/02/2016 06/15/2017  08/13/2017  12/01/2017  06/08/2018  01/10/2020   Current Med Regimen  Spriva, symbicort,  Singulair, xolair, nexium same Spiriva, Symbicort, Singulair, Nexium and dupulimab Spiriva, Symbicort, Singulair, Nexium and dupulimab   Act - ACT - a GSK test - 4 week. Total score. Max is 25, Lower score is worse.  <19 = poor control      19  ACQ 5 point- 1 week. wtd avg score. <1.0 is good control 0.75-1.25 is grey zone. >1.25 poor control. Delta 0.5 is clinically  meaningful   2.4 0.6 2.9   FeNO ppB  126 ppb 104 ppb 43 19   FeV1  1.777/90%, ratio 74,        Planned intervention  for visit   HRCT, talk to eye doc and if ok start dupixent, augmenting, prednsione,        Asthma Control Test ACT Total Score  01/10/2020 19     OV 08/08/2020  Subjective:  Patient ID: [name], female , DOB: February 03, 1933 , age 10 y.o. , MRN: [id number] , ADDRESS: [address] [place] [address] PCP Crist Infante, MD Patient Care Team: Crist Infante, MD as PCP - General (Internal Medicine) Nahser, Wonda Cheng, MD as PCP - Cardiology (Cardiology) Nahser, Wonda Cheng, MD as Consulting Physician (Cardiology)  This Provider for this visit: Treatment Team:  Attending Provider: Brand Males, MD    08/08/2020 -   Chief Complaint  Patient presents with  . Follow-up    Pt states she has been doing good since last visit. States she did have a flare up with allergies 2 weeks ago and states that she is a little SOB today.     HPI [name] 87 y.o. - presents for asthma follow-up.  She tells me that 2 weeks ago because of fall in the spring she started having sinus complaints and since then she has had more chest tightness.  Last few days even cough.  Last visit she decreased her Symbicort at 50% of  dosage.  She feels she needs to go up on it.  She is compliant with Singulair and Dupixent.  She wants to do Dupixent at home.  Today she is feels that she needs help with the asthma because it is an exacerbation.  There is no fever no nocturnal awakening or chest pain orthopnea proximal nocturnal dyspnea.     Asthma Control Panel  10/11/15 - IgE - 425 , rest of blood allergy panel negative. Blood eos 300cells (al;so 300 on 12/19/16). CT Sinus feb 2018 - chronic pan sinusitis     07/02/2016 06/15/2017  08/13/2017  12/01/2017  06/08/2018  01/10/2020   Current Med Regimen  Spriva, symbicort,  Singulair, xolair, nexium same Spiriva, Symbicort, Singulair, Nexium and dupulimab Spiriva, Symbicort, Singulair, Nexium and dupulimab   Act - ACT - a GSK test - 4 week. Total score. Max is 25, Lower score is worse.  <19 = poor control      19  ACQ 5 point- 1 week. wtd avg score. <1.0 is good control 0.75-1.25 is grey zone. >1.25 poor control. Delta 0.5 is clinically meaningful   2.4 0.6 2.9   FeNO ppB  126 ppb 104 ppb 43 19   FeV1  1.777/90%, ratio 74,        Planned intervention  for visit   HRCT, talk to eye doc and if ok start dupixent, augmenting, prednsione,       Asthma Control Test ACT Total Score  08/08/2020 20  01/10/2020 19   Lab Results  Component Value Date   NITRICOXIDE 24 07/27/2018      PFT  PFT Results Latest Ref Rng & Units 07/02/2016 01/16/2016 10/31/2015 08/03/2015  FVC-Pre L 2.39 2.26 1.88 2.29  FVC-Predicted Pre % 90 84 70 85  FVC-Post L 2.51 2.57 2.05 2.53  FVC-Predicted Post % 95 96 76 94  Pre FEV1/FVC % % 74 68 67 71  Post FEV1/FCV % % 77 76 70 72  FEV1-Pre L 1.77 1.54 1.26 1.63  FEV1-Predicted Pre %  90 78 63 81  FEV1-Post L 1.93 1.95 1.44 1.82  DLCO uncorrected ml/min/mmHg - - - 17.59  DLCO UNC% % - - - 69  DLCO corrected ml/min/mmHg - - - 18.36  DLCO COR %Predicted % - - - 72  DLVA Predicted % - - - 97  TLC L - - - 5.97  TLC % Predicted % - - - 115  RV % Predicted % -  - - 143       has a past medical history of Anemia, Angio-edema, Asthma, severe persistent, Cancer (HCC), Chronic kidney disease, stage II (mild), DDD (degenerative disc disease), cervical, Diverticulitis, Diverticulosis (yrs ago), Edema, lower extremity, GERD (gastroesophageal reflux disease), Headache, Heart murmur, History of breast cancer (1990 left mastectomy also), History of DVT of lower extremity (5 yrs ago), History of shingles, Hyperlipidemia, Internal hemorrhoid, Leg ulcer, left (HCC), Osteoporosis, unspecified, Perennial allergic rhinitis, Peripheral vascular disease ([place]), Pneumonia, Restless legs syndrome (RLS), Rheumatoid arthritis ([place]), Thyroid nodule, Unspecified essential hypertension, and Urticaria.   reports that she has never smoked. She has never used smokeless tobacco.  Past Surgical History:  Procedure Laterality Date  . CARDIAC CATHETERIZATION  08/ 01/ 2008   dr Cathie Olden   normal -- EF of 65%  . CARDIOVASCULAR STRESS TEST  05-22-2011   dr Cathie Olden   normal lexiscan perfusion study/  no ischemia/  lvsf  86%  . CATARACT EXTRACTION W/ INTRAOCULAR LENS  IMPLANT, BILATERAL    . CHOLECYSTECTOMY  1993   laparoscopic  . CONVERSION TO TOTAL HIP Left 12/03/2017   Procedure: CONVERSION TO LEFT TOTAL HIP;  Surgeon: Paralee Cancel, MD;  Location: [facility];  Service: Orthopedics;  Laterality: Left;  90 mins  . HIP ARTHROPLASTY Left 12/22/2016   Procedure: HEMIARTHROPLASTY, LEFT;  Surgeon: Paralee Cancel, MD;  Location: [facility];  Service: Orthopedics;  Laterality: Left;  . INCISION AND DRAINAGE OF WOUND Left 10/24/2013   Procedure: IRRIGATION AND DEBRIDEMENT OF LEFT LEG WITH PLACEMENT OF ACELL AND WOUND VAC;  Surgeon: Theodoro Kos, DO;  Location: [place];  Service: Plastics;  Laterality: Left;  . KNEE ARTHROPLASTY    . [place]  . MASTECTOMY Bilateral rigth 1989///   left  1990   breast cancer 1989//   fibrocytic disease 1990  . ORIF HUMERUS  FRACTURE Right 09/21/2018   Procedure: OPEN REDUCTION INTERNAL FIXATION (ORIF) PROXIMAL HUMERUS FRACTURE;  Surgeon: Nicholes Stairs, MD;  Location: [facility] [facility];  Service: Orthopedics;  Laterality: Right;  90 mins  . TOTAL ABDOMINAL HYSTERECTOMY W/ BILATERAL SALPINGOOPHORECTOMY  1989   done at same time as right mastectomy  . TOTAL KNEE ARTHROPLASTY Right 10/05/2012   Procedure: RIGHT TOTAL KNEE ARTHROPLASTY;  Surgeon: Mauri Pole, MD;  Location: [facility];  Service: Orthopedics;  Laterality: Right;    Allergies  Allergen Reactions  . Egg Phospholipids   . Eggs Or Egg-Derived Products     UNSPECIFIED REACTION  RAW EGGS.. Can eat cooked eggs  . Influenza Vaccines     UNSPECIFIED REACTION  Egg allergy   . Nucala [Mepolizumab]     UNSPECIFIED REACTION   . Cephalosporins Rash  . Codeine Rash  . Gabapentin Nausea Only  . Levofloxacin Rash  . Pneumococcal Vaccines Rash    Immunization History  Administered Date(s) Administered  . Janssen (J&J) SARS-COV-2 Vaccination 04/19/2020  . Pneumococcal Conjugate-13 09/14/2012  . Pneumococcal Polysaccharide-23 01/31/2009  . Tdap 09/07/2018  . Zoster Recombinat (Shingrix) 04/18/2017, 09/16/2017  Family History  Problem Relation Age of Onset  . Heart attack Mother   . Asthma Mother   . Heart disease Mother   . Hyperlipidemia Mother   . Allergic rhinitis Mother   . Heart attack Father   . Heart failure Father   . Deep vein thrombosis Father   . Heart disease Father   . Peripheral vascular disease Father        amputation  . Asthma Brother   . Allergic rhinitis Brother   . Asthma Sister   . Eczema Sister   . Allergic rhinitis Sister   . Cancer Brother   . Asthma Brother   . Coronary artery disease Sister   . Heart disease Sister   . Asthma Maternal Grandmother   . Urticaria Neg Hx      Current Outpatient Medications:  .  acetaminophen (TYLENOL) 500 MG tablet, Take 2 tablets (1,000 mg total) by mouth every 8 (eight) hours.,  Disp: 30 tablet, Rfl: 0 .  albuterol (PROAIR HFA) 108 (90 Base) MCG/ACT inhaler, Inhale 2 puffs into the lungs every 6 (six) hours as needed for wheezing or shortness of breath., Disp: 18 g, Rfl: 5 .  albuterol (PROVENTIL) (2.5 MG/3ML) 0.083% nebulizer solution, Take 3 mLs (2.5 mg total) by nebulization every 6 (six) hours as needed for wheezing or shortness of breath. Dx: J45.41, Disp: 360 mL, Rfl: 0 .  benzonatate (TESSALON) 100 MG capsule, Take 1 capsule (100 mg total) by mouth 3 (three) times daily as needed for cough., Disp: 100 capsule, Rfl: 1 .  budesonide-formoterol (SYMBICORT) 160-4.5 MCG/ACT inhaler, Inhale 2 puffs into the lungs daily., Disp: , Rfl:  .  Calcium Carbonate-Vitamin D 600-400 MG-UNIT tablet, Take 1 tablet by mouth daily., Disp: , Rfl:  .  chlorpheniramine (CHLOR-TRIMETON) 4 MG tablet, Take 4-8 mg by mouth See admin instructions. 4 mg in the morning and 8 mg at bedtime, Disp: , Rfl:  .  Cholecalciferol (VITAMIN D3) 1000 UNITS tablet, Take 1,000 Units by mouth every evening. , Disp: , Rfl:  .  dextromethorphan (DELSYM) 30 MG/5ML liquid, Take 60 mg by mouth 2 (two) times daily as needed for cough., Disp: , Rfl:  .  dextromethorphan-guaiFENesin (MUCINEX DM) 30-600 MG 12hr tablet, Take 1 tablet by mouth daily as needed for cough., Disp: , Rfl:  .  dupilumab (DUPIXENT) 300 MG/2ML prefilled syringe, Inject 300 mg into the skin every 14 (fourteen) days., Disp: 4 mL, Rfl: 5 .  Dupilumab (DUPIXENT) 300 MG/2ML SOPN, Inject 300 mg into the skin every 14 (fourteen) days., Disp: 3.92 mL, Rfl: 0 .  fexofenadine (ALLEGRA) 180 MG tablet, Take 180 mg by mouth daily.  , Disp: , Rfl:  .  furosemide (LASIX) 20 MG tablet, TAKE 1 TABLET (20 MG TOTAL) BY MOUTH DAILY AS NEEDED FOR FLUID OR EDEMA., Disp: 90 tablet, Rfl: 0 .  ibandronate (BONIVA) 150 MG tablet, Take 1 tablet by mouth every 30 (thirty) days. , Disp: , Rfl: 3 .  ipratropium (ATROVENT) 0.06 % nasal spray, Place 2 sprays into both nostrils 2  (two) times daily., Disp: , Rfl:  .  mometasone (NASONEX) 50 MCG/ACT nasal spray, USE 2 SPRAYS NASALLY 2 TIMES DAILY, Disp: 17 g, Rfl: 11 .  montelukast (SINGULAIR) 10 MG tablet, TAKE 1 TABLET BY MOUTH EVERY NIGHT AT BEDTIME, Disp: 90 tablet, Rfl: 2 .  Multiple Vitamin (MULTIVITAMIN WITH MINERALS) TABS tablet, Take 1 tablet by mouth daily. Centrum Silver Multivitamin, Disp: , Rfl:  .  Multiple  Vitamins-Minerals (PRESERVISION AREDS PO), Take 1 tablet by mouth 2 (two) times daily., Disp: , Rfl:  .  mupirocin ointment (BACTROBAN) 2 %, as needed. , Disp: , Rfl:  .  NEXIUM 40 MG capsule, TAKE 1 CAPSULE BY MOUTH TWICE A DAY, Disp: 180 capsule, Rfl: 1 .  nitroGLYCERIN (NITROSTAT) 0.4 MG SL tablet, Place 1 tablet (0.4 mg total) under the tongue every 5 (five) minutes as needed for chest pain., Disp: 25 tablet, Rfl: 3 .  potassium chloride (KLOR-CON) 10 MEQ tablet, TAKE 1 TABLET BY MOUTH EVERY DAY, Disp: 90 tablet, Rfl: 0 .  pramipexole (MIRAPEX) 0.125 MG tablet, Take 0.375 mg by mouth at bedtime., Disp: , Rfl:  .  rivaroxaban (XARELTO) 10 MG TABS tablet, Take 10 mg by mouth daily., Disp: , Rfl:  .  rosuvastatin (CRESTOR) 5 MG tablet, TAKE 1 TABLET BY MOUTH EVERYDAY AT BEDTIME, Disp: 90 tablet, Rfl: 0 .  Spacer/Aero-Holding Chambers (AEROCHAMBER MV) inhaler, by Other route. Use as instructed , Disp: , Rfl:  .  telmisartan (MICARDIS) 20 MG tablet, Take 20 mg by mouth daily. , Disp: , Rfl:  .  vitamin B-12 (CYANOCOBALAMIN) 500 MCG tablet, Take 500 mcg by mouth daily.  , Disp: , Rfl:   Current Facility-Administered Medications:  .  omalizumab (XOLAIR) injection 300 mg, 300 mg, Subcutaneous, Q14 Days, Arwen Haseley, MD, 300 mg at 07/27/17 1013 .  omalizumab Arvid Right) injection 300 mg, 300 mg, Subcutaneous, Q14 Days, Mahitha Hickling, MD, 300 mg at 08/13/17 0815 .  omalizumab Arvid Right) injection 300 mg, 300 mg, Subcutaneous, Q14 Days, Maryjayne Kleven, Belva Crome, MD, 300 mg at 08/27/17 0950      Objective:    Vitals:   08/08/20 1120  BP: 118/80  Pulse: 83  SpO2: 98%  Weight: 145 lb 3.2 oz (65.9 kg)  Height: 5\' 5"  (1.651 m)    Estimated body mass index is 24.16 kg/m as calculated from the following:   Height as of this encounter: 5\' 5"  (1.651 m).   Weight as of this encounter: 145 lb 3.2 oz (65.9 kg).  @WEIGHTCHANGE @  [employer]   08/08/20 1120  Weight: 145 lb 3.2 oz (65.9 kg)     Physical Exam  General: No distress.  A bit labored in conversation Neuro: Alert and Oriented x 3. GCS 15. Speech normal Psych: Pleasant Resp:  Barrel Chest - no.  Wheeze - no, Crackles - no, No overt respiratory distress CVS: Normal heart sounds. Murmurs - no Ext: Stigmata of Connective Tissue Disease - no HEENT: Normal upper airway. PEERL +. No post nasal drip        Assessment:       ICD-10-CM   1. Severe persistent asthma with acute exacerbation  J45.51        Plan:     Patient Instructions     ICD-10-CM   1. Severe persistent asthma with acute exacerbation  J45.51     - in flare up due to pollen - does not meet admission criteria   Plan  - continue dupixent,  and singulair and Symbicort  But go back to Symbicort  160/4.5 at 2 puff tiwce daily (double up)  -Continue albuterol as needed  = work with CMA to get dupixent changed to outpatient home use - Take prednisone 40 mg daily x 2 days, then 20mg  daily x 2 days, then 10mg  daily x 2 days, then 5mg  daily x 2 days and stop  Follow-up 7-14 days with app - tele or face to face  to ensure you are better 6 months with Dr Gaye Alken    Dr. Brand Males, M.D., F.C.C.P,  Pulmonary and Critical Care Medicine Staff Physician, [place] Director - Interstitial Lung Disease  Program  Pulmonary [place] [facility] [place], [place], [address]  Pager: [phone], If no answer or between  15:00h - 7:00h: call [phone] Telephone: [phone]  12:06 PM 08/08/2020

## 2020-08-08 NOTE — Telephone Encounter (Signed)
Patient ready to learn to self- inject Dupixent. Patient has active PA on file that covers both pen and syringe. [facility], will just need new rx sent in for pens, once trained.

## 2020-08-09 NOTE — Telephone Encounter (Addendum)
Patient scheduled to transition to home for [place] on 08/15/20 @1 :20pm with pharmacy clinic. Dupixent pens arrived to clinic today  Knox Saliva, PharmD, MPH Clinical Pharmacist (Rheumatology and Pulmonology)

## 2020-08-13 NOTE — Progress Notes (Signed)
Diagnosis: Asthma  Provider:  Marshell Garfinkel, MD  Procedure: Injection  Dupixent (Dupilumab), Dose: 300 mg, Site: subcutaneous  Discharge: Condition: Good, Destination: Home . AVS provided to patient.   Performed by:  Jonelle Sidle, RN

## 2020-08-15 ENCOUNTER — Other Ambulatory Visit: Payer: Self-pay | Admitting: Internal Medicine

## 2020-08-15 ENCOUNTER — Other Ambulatory Visit: Payer: Self-pay

## 2020-08-15 ENCOUNTER — Ambulatory Visit: Payer: Medicare Other | Admitting: Pharmacist

## 2020-08-15 ENCOUNTER — Ambulatory Visit: Payer: Medicare Other

## 2020-08-15 DIAGNOSIS — J4550 Severe persistent asthma, uncomplicated: Secondary | ICD-10-CM

## 2020-08-15 DIAGNOSIS — Z7189 Other specified counseling: Secondary | ICD-10-CM

## 2020-08-15 MED ORDER — DUPIXENT 300 MG/2ML ~~LOC~~ SOAJ: 300.0000 mg | SUBCUTANEOUS | 1 refills | Status: DC

## 2020-08-15 NOTE — Progress Notes (Addendum)
HPI Patient presents today to [facility] to see pharmacy team to transition to self-administration of [place]. Patient started Dupixent on 10/08/2017 and has received in the clinic since then.  Past medical history includes severe, persistent asthma, chronic allergic rhinitis, DVT, and GERD. She was last seen by Dr. Chase Caller on 08/08/20 nad prescribed a prednisone taper pack. Has one more day left (0.5 tabs - 5mg  remaining tomorrow).  No recent hospitalizations.  She reports that she pays $60 per month for her Dupixent, but is still not interested in patient assistance program.    Patient does not have history of anaphylaxis with both Xolair and Nucala.  Respiratory Medications Current: montelukast 10mg  nightly, Symbicort 160/4.5mg  (2 puffs twice daily) Tried in past: Xolair, Nucala (had allergic reaction that consisted of shingles-like rash and red rash on arm. No anaphylactic reaction. Did report that allergic-type (?) reaction was inconsistent but occurred after each dose.)  Patient reports no known adherence challenges  OBJECTIVE Allergies  Allergen Reactions  . Egg Phospholipids   . Eggs Or Egg-Derived Products     UNSPECIFIED REACTION  RAW EGGS.. Can eat cooked eggs  . Influenza Vaccines     UNSPECIFIED REACTION  Egg allergy   . Nucala [Mepolizumab]     UNSPECIFIED REACTION   . Cephalosporins Rash  . Codeine Rash  . Gabapentin Nausea Only  . Levofloxacin Rash  . Pneumococcal Vaccines Rash    Outpatient Encounter Medications as of 08/15/2020  Medication Sig Note  . predniSONE (DELTASONE) 10 MG tablet Take 4 tablets (40 mg total) by mouth daily with breakfast for 2 days, THEN 2 tablets (20 mg total) daily with breakfast for 2 days, THEN 1 tablet (10 mg total) daily with breakfast for 2 days, THEN 0.5 tablets (5 mg total) daily with breakfast for 2 days.   [name] acetaminophen (TYLENOL) 500 MG tablet Take 2 tablets (1,000 mg total) by mouth every 8 (eight) hours.   [name]  albuterol (PROAIR HFA) 108 (90 Base) MCG/ACT inhaler Inhale 2 puffs into the lungs every 6 (six) hours as needed for wheezing or shortness of breath.   [name] albuterol (PROVENTIL) (2.5 MG/3ML) 0.083% nebulizer solution Take 3 mLs (2.5 mg total) by nebulization every 6 (six) hours as needed for wheezing or shortness of breath. Dx: J45.41   . benzonatate (TESSALON) 100 MG capsule Take 1 capsule (100 mg total) by mouth 3 (three) times daily as needed for cough.   . budesonide-formoterol (SYMBICORT) 160-4.5 MCG/ACT inhaler Inhale 2 puffs into the lungs daily.   . Calcium Carbonate-Vitamin D 600-400 MG-UNIT tablet Take 1 tablet by mouth daily.   . chlorpheniramine (CHLOR-TRIMETON) 4 MG tablet Take 4-8 mg by mouth See admin instructions. 4 mg in the morning and 8 mg at bedtime   . Cholecalciferol (VITAMIN D3) 1000 UNITS tablet Take 1,000 Units by mouth every evening.    [name] dextromethorphan (DELSYM) 30 MG/5ML liquid Take 60 mg by mouth 2 (two) times daily as needed for cough.   . dextromethorphan-guaiFENesin (MUCINEX DM) 30-600 MG 12hr tablet Take 1 tablet by mouth daily as needed for cough.   . Dupilumab (DUPIXENT) 300 MG/2ML SOPN Inject 300 mg into the skin every 14 (fourteen) days. Deliver to patient's home for self-administration   . fexofenadine (ALLEGRA) 180 MG tablet Take 180 mg by mouth daily.     . furosemide (LASIX) 20 MG tablet TAKE 1 TABLET (20 MG TOTAL) BY MOUTH DAILY AS NEEDED FOR FLUID OR EDEMA.   . ibandronate (BONIVA)  150 MG tablet Take 1 tablet by mouth every 30 (thirty) days.  09/16/2018: 1st day of each month  . ipratropium (ATROVENT) 0.06 % nasal spray Place 2 sprays into both nostrils 2 (two) times daily.   . mometasone (NASONEX) 50 MCG/ACT nasal spray USE 2 SPRAYS NASALLY 2 TIMES DAILY   . montelukast (SINGULAIR) 10 MG tablet TAKE 1 TABLET BY MOUTH EVERY NIGHT AT BEDTIME   . Multiple Vitamin (MULTIVITAMIN WITH MINERALS) TABS tablet Take 1 tablet by mouth daily. Centrum Silver Multivitamin    . Multiple Vitamins-Minerals (PRESERVISION AREDS PO) Take 1 tablet by mouth 2 (two) times daily.   . mupirocin ointment (BACTROBAN) 2 % as needed.    [name] NEXIUM 40 MG capsule TAKE 1 CAPSULE BY MOUTH TWICE A DAY   . nitroGLYCERIN (NITROSTAT) 0.4 MG SL tablet Place 1 tablet (0.4 mg total) under the tongue every 5 (five) minutes as needed for chest pain.   . potassium chloride (KLOR-CON) 10 MEQ tablet TAKE 1 TABLET BY MOUTH EVERY DAY   . pramipexole (MIRAPEX) 0.125 MG tablet Take 0.375 mg by mouth at bedtime.   . rivaroxaban (XARELTO) 10 MG TABS tablet Take 10 mg by mouth daily.   . rosuvastatin (CRESTOR) 5 MG tablet TAKE 1 TABLET BY MOUTH EVERYDAY AT BEDTIME   . Spacer/Aero-Holding Chambers (AEROCHAMBER MV) inhaler by Other route. Use as instructed    . telmisartan (MICARDIS) 20 MG tablet Take 20 mg by mouth daily.    . vitamin B-12 (CYANOCOBALAMIN) 500 MCG tablet Take 500 mcg by mouth daily.     . [DISCONTINUED] dupilumab (DUPIXENT) 300 MG/2ML prefilled syringe Inject 300 mg into the skin every 14 (fourteen) days. 08/15/2020: Changed to pens for self-admin  . [DISCONTINUED] Dupilumab (DUPIXENT) 300 MG/2ML SOPN Inject 300 mg into the skin every 14 (fourteen) days.   . [DISCONTINUED] omalizumab Arvid Right) injection 300 mg    . [DISCONTINUED] omalizumab Arvid Right) injection 300 mg    . [DISCONTINUED] omalizumab Arvid Right) injection 300 mg     No facility-administered encounter medications on file as of 08/15/2020.     Immunization History  Administered Date(s) Administered  . Janssen (J&J) SARS-COV-2 Vaccination 04/19/2020  . Pneumococcal Conjugate-13 09/14/2012  . Pneumococcal Polysaccharide-23 01/31/2009  . Tdap 09/07/2018  . Zoster Recombinat (Shingrix) 04/18/2017, 09/16/2017     PFTs PFT Results Latest Ref Rng & Units 07/02/2016 01/16/2016 10/31/2015 08/03/2015  FVC-Pre L 2.39 2.26 1.88 2.29  FVC-Predicted Pre % 90 84 70 85  FVC-Post L 2.51 2.57 2.05 2.53  FVC-Predicted Post % 95 96 76 94  Pre  FEV1/FVC % % 74 68 67 71  Post FEV1/FCV % % 77 76 70 72  FEV1-Pre L 1.77 1.54 1.26 1.63  FEV1-Predicted Pre % 90 78 63 81  FEV1-Post L 1.93 1.95 1.44 1.82  DLCO uncorrected ml/min/mmHg - - - 17.59  DLCO UNC% % - - - 69  DLCO corrected ml/min/mmHg - - - 18.36  DLCO COR %Predicted % - - - 72  DLVA Predicted % - - - 97  TLC L - - - 5.97  TLC % Predicted % - - - 115  RV % Predicted % - - - 143     Eosinophils Most recent blood eosinophil count was 130 cells/microL taken on 08/22/19.   IgE 10/11/15 425  Assessment   1. Biologics training (Dupilumab (Dupixent) o MOA: a human monoclonal IgG4 antibody that inhibits interleukin-4 and interleukin-13 cytokine-induced responses, including release of proinflammatory cytokines, chemokines, and IgE o Response  to therapy: 3-6 months o Side effects: injection site reaction (6-18%), antibody development (5-16%), ophthalmic conjunctivitis (2-16%), transient blood eosinophilia (1-2%) o Dosing: 600 mg followed by 300 mg subQ every other week o Administration:  1. Do not shake.  2. Do not use if solution is discolored or contains particulate matter or if window on prefilled pen is yellow (indicates pen has been used).  1. Administer as a subcutaneous injection into the thigh or lower abdomen (avoiding areas within 2 inches of navel); caregiver may administer in upper arm.  2. Rotate injection sites, including initial doses (administer 600 mg initial dose as two 300 mg injections at different sites).   Patient able to self-administer in right abdomen. She did have some difficulty d/t shakiness, but she will plan to administer in the thigh moving forward for greater control.  2. Medication Reconciliation  A drug regimen assessment was performed, including review of allergies, interactions, disease-state management, dosing and immunization history. Medications were reviewed with the patient, including name, instructions, indication, goals of therapy,  potential side effects, importance of adherence, and safe use.  Drug interaction(s): none identified  3. Immunizations  Patient is indicated for the influenzae but has declined consistently d/t egg allergy. She is UTD on Shingrix vaccine.  Patient received one J&J COVID-19 vaccine.   PLAN  Continue Dupixent 300 mg every 14 days as prescribed. [facility]. Advised that copay may or may not be cheaper since she pays $60 per month.  Discussed that we could pursue patient assistance in the future if she was ever interested and cost became unsustainable. Patient verbalized understanding.   Patient will mark dose due dates on her calendar at home which is what she was previously doing  Continue maintenance inhaler regimen of high-dose Symbicort, mometasone nasal spray twice daily, and montelukast 10mg  nightly.  F/u scheduled with Tammy Parrett on 08/18/20  All questions encouraged and answered.  Instructed patient to reach out with any further questions or concerns.  Thank you for allowing pharmacy to participate in this patient's care.  Knox Saliva, PharmD, MPH Clinical Pharmacist (Rheumatology and Pulmonology)

## 2020-08-15 NOTE — Patient Instructions (Signed)
Remember the 5 C's:  COUNTER - leave on the counter at least 30 minutes but up to overnight to bring medication to room temperature. This may help prevent stinging  COLD - place something cold (like an ice gel pack or cold water bottle) on the injection site just before cleansing with alcohol. This may help reduce pain  CLARITIN - use Claritin (generic name is loratadine) for the first two weeks of treatment or the day of, the day before, and the day after injecting. This will help to minimize injection site reactions  CORTISONE CREAM - apply if injection site is irritated and itching  CALL ME - if injection site reaction is bigger than the size of your fist, looks infected, blisters, or if you develop hives     Anaphylactic Reactions  Anaphylactic reactions can occur up to 24 hours after administration.  What are the signs and symptoms of anaphylaxis?  Symptoms can vary from a mild skin reaction to more severe reactions including: . Wheezing, shortness of breath, cough, chest tightness or trouble breathing . Low blood pressure, dizziness, fainting, rapid of weak heartbeat, anxiety, or feeling of "impending doom" . Flushing, itching, hives, or feeling warm . Swelling of the throat or tongue, throat tightness, hoarse voice, or trouble swallowing  Some of these symptoms require immediate treatment, as they can be life threatening.  If you experience severe symptoms which are bolded above use your Epipen and call 911 for immediate emergency care.

## 2020-08-16 MED ORDER — DUPIXENT 300 MG/2ML ~~LOC~~ SOAJ: 300.0000 mg | SUBCUTANEOUS | 1 refills | Status: DC

## 2020-08-16 NOTE — Addendum Note (Signed)
Addended by: Cassandria Anger on: 08/16/2020 04:04 PM   Modules accepted: Orders

## 2020-08-17 ENCOUNTER — Ambulatory Visit (INDEPENDENT_AMBULATORY_CARE_PROVIDER_SITE_OTHER): Payer: Medicare Other | Admitting: Adult Health

## 2020-08-17 ENCOUNTER — Encounter: Payer: Self-pay | Admitting: Adult Health

## 2020-08-17 ENCOUNTER — Other Ambulatory Visit: Payer: Self-pay

## 2020-08-17 DIAGNOSIS — J4550 Severe persistent asthma, uncomplicated: Secondary | ICD-10-CM

## 2020-08-17 DIAGNOSIS — J309 Allergic rhinitis, unspecified: Secondary | ICD-10-CM | POA: Diagnosis not present

## 2020-08-17 DIAGNOSIS — I200 Unstable angina: Secondary | ICD-10-CM

## 2020-08-17 NOTE — Patient Instructions (Addendum)
Keep up good work, glad you are feeling better  Thanks so much for my handmade dishcloth, it is beautiful .  Continue on Symbicort 2 puffs Twice daily   Continue on Allegra and Singulair daily .  Continue on Dupixent .  Albuterol inhaler As needed   Follow up with Dr. Chase Caller in 4  months and As needed

## 2020-08-17 NOTE — Assessment & Plan Note (Signed)
Recent exacerbation now improved.  Patient is continue on her current maintenance regimen. We will follow-up in 4 months and as needed

## 2020-08-17 NOTE — Progress Notes (Signed)
@[name], female    DOB: 1932/07/11, 87 y.o.   MRN: [id number]  Chief Complaint  Patient presents with  . Follow-up    Referring provider: Crist Infante, MD  HPI: 87 year old female with severe persistent asthma with allergic phenotype and eosinophilia Medical history significant for recurrent DVT on chronic anticoagulation with Xarelto  TEST/EVENTS :   CT chest November2015 in March 2016: No reports of emphysema seen on CT chest or interstitial lung disease  FeNO in our oifice is 47ppb and ELEVATED  TEST  PFT 07/02/13: FVC 2.39 L (90%) FEV1 1.77 L (90%) FEV1/FVC 0.74 FEF 25-75 1.32 L (95%) negative bronchodilator response 01/16/16: FVC 2.26 L (84%) FEV1 1.54 L (78%) FEV1/FVC 0.68 FEF 25-75 0.92 L (65%) positive bronchodilator response 10/31/15: FVC 1.88 L (70%) FEV1 1.26 L (63%) FEV1/FVC 0.67 FEF 25-75 0.68 L (43%) negative bronchodilator response 08/03/15: FVC 2.29 L (85%) FEV1 1.63 L (81%) FEV1/FVC 0.71 FEF 25-75 1.05 L (73%) positive bronchodilator response TLC 5.97 L (115%) RV 143% ERV 35% DLCO corrected 72% (Hgb 12.1) 10/10/11:FVC 2.21 L (77%) FEV1 1.57 L (74%) FEV1/FVC 0.71 FEF 25-75 1.10 L (64%)  08/17/2020 Follow up : Asthma  Patient presents for a 1 week follow-up.  Patient was seen last week for a follow-up for asthma.  She was having more asthma symptoms with cough and wheezing.  She was given a prednisone taper.  Which she says she finished yesterday.  She is feeling much better.  Symptoms are returning back to her baseline.  She has minimum cough and wheezing.  She denies any fever or discolored mucus.  She remains on maintenance therapy with Symbicort twice daily.  She says she had only been taking 1 puff twice daily and now over the last week is increased back up to 2 puffs twice daily.  She is on Singulair daily.  And is on Dupixent.  She says since starting Bronson she has felt so much better with her asthma.  And has had less exacerbations.   Patient is starting home injections soon.  And does endorse that she has a up-to-date EpiPen at home.  Allergies  Allergen Reactions  . Egg Phospholipids   . Eggs Or Egg-Derived Products     UNSPECIFIED REACTION  RAW EGGS.. Can eat cooked eggs  . Influenza Vaccines     UNSPECIFIED REACTION  Egg allergy   . Nucala [Mepolizumab]     UNSPECIFIED REACTION   . Cephalosporins Rash  . Codeine Rash  . Gabapentin Nausea Only  . Levofloxacin Rash  . Pneumococcal Vaccines Rash    Immunization History  Administered Date(s) Administered  . Janssen (J&J) SARS-COV-2 Vaccination 04/19/2020  . Pneumococcal Conjugate-13 09/14/2012  . Pneumococcal Polysaccharide-23 01/31/2009  . Tdap 09/07/2018  . Zoster Recombinat (Shingrix) 04/18/2017, 09/16/2017    Past Medical History:  Diagnosis Date  . Anemia    after hysterectomy  . Angio-edema   . Asthma, severe persistent    pulmologist-- dr Lynford Citizen  . Cancer [facility])    Breast cancer on right  . Chronic kidney disease, stage II (mild)   . DDD (degenerative disc disease), cervical   . Diverticulitis   . Diverticulosis yrs ago   hx of   . Edema, lower extremity    occ both legs swell  . GERD (gastroesophageal reflux disease)   . Headache    sinus headaches  . Heart murmur    no problems - per pt  . History of breast cancer 1990  left mastectomy also   1989  S/P   RIGHT MASTECTOMY ;  NO CHEMORADIATION //   NO RECURRENCE  . History of DVT of lower extremity 5 yrs ago   right leg  . History of shingles   . Hyperlipidemia   . Internal hemorrhoid   . Leg ulcer, left ([place])   . Osteoporosis, unspecified    Knee and hip osteoarthritis bilaterally  . Perennial allergic rhinitis   . Peripheral vascular disease ([place])   . Pneumonia   . Restless legs syndrome (RLS)   . Rheumatoid arthritis ([place])    hands  . Thyroid nodule    followed by dr perrini yearly, no current problam  . Unspecified essential hypertension   . Urticaria     Tobacco  History: Social History   Tobacco Use  Smoking Status Never Smoker  Smokeless Tobacco Never Used   Counseling given: Not Answered   Outpatient Medications Prior to Visit  Medication Sig Dispense Refill  . acetaminophen (TYLENOL) 500 MG tablet Take 2 tablets (1,000 mg total) by mouth every 8 (eight) hours. 30 tablet 0  . albuterol (PROAIR HFA) 108 (90 Base) MCG/ACT inhaler Inhale 2 puffs into the lungs every 6 (six) hours as needed for wheezing or shortness of breath. 18 g 5  . benzonatate (TESSALON) 100 MG capsule Take 1 capsule (100 mg total) by mouth 3 (three) times daily as needed for cough. 100 capsule 1  . budesonide-formoterol (SYMBICORT) 160-4.5 MCG/ACT inhaler Inhale 2 puffs into the lungs 2 (two) times daily.    . Calcium Carbonate-Vitamin D 600-400 MG-UNIT tablet Take 1 tablet by mouth daily.    . chlorpheniramine (CHLOR-TRIMETON) 4 MG tablet Take 4-8 mg by mouth See admin instructions. 4 mg in the morning and 8 mg at bedtime    . Cholecalciferol (VITAMIN D3) 1000 UNITS tablet Take 1,000 Units by mouth every evening.     [name] dextromethorphan (DELSYM) 30 MG/5ML liquid Take 60 mg by mouth 2 (two) times daily as needed for cough.    . dextromethorphan-guaiFENesin (MUCINEX DM) 30-600 MG 12hr tablet Take 1 tablet by mouth daily as needed for cough.    . Dupilumab (DUPIXENT) 300 MG/2ML SOPN Inject 300 mg into the skin every 14 (fourteen) days. Deliver to patient's home for self-administration 12 mL 1  . fexofenadine (ALLEGRA) 180 MG tablet Take 180 mg by mouth daily.      . furosemide (LASIX) 20 MG tablet TAKE 1 TABLET (20 MG TOTAL) BY MOUTH DAILY AS NEEDED FOR FLUID OR EDEMA. 90 tablet 0  . ibandronate (BONIVA) 150 MG tablet Take 1 tablet by mouth every 30 (thirty) days.   3  . ipratropium (ATROVENT) 0.06 % nasal spray Place 2 sprays into both nostrils 2 (two) times daily.    . mometasone (NASONEX) 50 MCG/ACT nasal spray USE 2 SPRAYS NASALLY 2 TIMES DAILY 17 g 11  . montelukast  (SINGULAIR) 10 MG tablet TAKE 1 TABLET BY MOUTH EVERY NIGHT AT BEDTIME 90 tablet 2  . Multiple Vitamin (MULTIVITAMIN WITH MINERALS) TABS tablet Take 1 tablet by mouth daily. Centrum Silver Multivitamin    . Multiple Vitamins-Minerals (PRESERVISION AREDS PO) Take 1 tablet by mouth 2 (two) times daily.    . mupirocin ointment (BACTROBAN) 2 % as needed.     [name] NEXIUM 40 MG capsule TAKE 1 CAPSULE BY MOUTH TWICE A DAY 180 capsule 1  . nitroGLYCERIN (NITROSTAT) 0.4 MG SL tablet Place 1 tablet (0.4 mg total) under the tongue every  5 (five) minutes as needed for chest pain. 25 tablet 3  . potassium chloride (KLOR-CON) 10 MEQ tablet TAKE 1 TABLET BY MOUTH EVERY DAY 90 tablet 0  . pramipexole (MIRAPEX) 0.125 MG tablet Take 0.375 mg by mouth at bedtime.    . rivaroxaban (XARELTO) 10 MG TABS tablet Take 10 mg by mouth daily.    . rosuvastatin (CRESTOR) 5 MG tablet TAKE 1 TABLET BY MOUTH EVERYDAY AT BEDTIME 90 tablet 0  . Spacer/Aero-Holding Chambers (AEROCHAMBER MV) inhaler by Other route. Use as instructed     . telmisartan (MICARDIS) 20 MG tablet Take 20 mg by mouth daily.     . vitamin B-12 (CYANOCOBALAMIN) 500 MCG tablet Take 500 mcg by mouth daily.      [name] albuterol (PROVENTIL) (2.5 MG/3ML) 0.083% nebulizer solution Take 3 mLs (2.5 mg total) by nebulization every 6 (six) hours as needed for wheezing or shortness of breath. Dx: J45.41 (Patient not taking: Reported on 08/17/2020) 360 mL 0   No facility-administered medications prior to visit.     Review of Systems:   Constitutional:   No  weight loss, night sweats,  Fevers, chills, fatigue, or  lassitude.  HEENT:   No headaches,  Difficulty swallowing,  Tooth/dental problems, or  Sore throat,                No sneezing, itching, ear ache,  +nasal congestion, post nasal drip,   CV:  No chest pain,  Orthopnea, PND, swelling in lower extremities, anasarca, dizziness, palpitations, syncope.   GI  No heartburn, indigestion, abdominal pain, nausea,  vomiting, diarrhea, change in bowel habits, loss of appetite, bloody stools.   Resp:    No excess mucus, no productive cough,  No non-productive cough,  No coughing up of blood.  No change in color of mucus.  No wheezing.  No chest wall deformity  Skin: no rash or lesions.  GU: no dysuria, change in color of urine, no urgency or frequency.  No flank pain, no hematuria   MS:  No joint pain or swelling.  No decreased range of motion.  No back pain.    Physical Exam  BP 112/68 (BP Location: Left Arm, Cuff Size: Normal)   Pulse 81   Temp 98.3 F (36.8 C)   Ht 5\' 5"  (1.651 m)   Wt 143 lb 9.6 oz (65.1 kg)   SpO2 98% Comment: RA  BMI 23.90 kg/m   GEN: A/Ox3; pleasant , NAD, well nourished    HEENT:  [place]/AT,  , NOSE-clear, THROAT-clear, no lesions, no postnasal drip or exudate noted.   NECK:  Supple w/ fair ROM; no JVD; normal carotid impulses w/o bruits; no thyromegaly or nodules palpated; no lymphadenopathy.    RESP  Clear  P & A; w/o, wheezes/ rales/ or rhonchi. no accessory muscle use, no dullness to percussion  CARD:  RRR, no m/r/g, no peripheral edema, pulses intact, no cyanosis or clubbing.  GI:   Soft & nt; nml bowel sounds; no organomegaly or masses detected.   Musco: Warm bil, no deformities or joint swelling noted.   Neuro: alert, no focal deficits noted.    Skin: Warm, no lesions or rashes    Lab Results:  BMET  BNP No results found for: BNP  ProBNP No results found for: PROBNP  Imaging: No results found.  dupilumab (DUPIXENT) prefilled syringe 300 mg    Date Action Dose Route User   06/19/2020 1421 Given 300 mg Subcutaneous (Right Arm) Salome Arnt  M, LPN    dupilumab (DUPIXENT) prefilled syringe 300 mg    Date Action Dose Route User   07/03/2020 1400 Given 300 mg Subcutaneous (Left Arm) Elton Sin, LPN    dupilumab (DUPIXENT) prefilled syringe 300 mg    Date Action Dose Route User   07/17/2020 1507 Given 300 mg Subcutaneous (Right Arm) Elton Sin, LPN    dupilumab (DUPIXENT) prefilled syringe 300 mg    Date Action Dose Route User   08/01/2020 1351 Given 300 mg Subcutaneous (Abdominal Tissue) Arnoldo Morale, RN      PFT Results Latest Ref Rng & Units 07/02/2016 01/16/2016 10/31/2015 08/03/2015  FVC-Pre L 2.39 2.26 1.88 2.29  FVC-Predicted Pre % 90 84 70 85  FVC-Post L 2.51 2.57 2.05 2.53  FVC-Predicted Post % 95 96 76 94  Pre FEV1/FVC % % 74 68 67 71  Post FEV1/FCV % % 77 76 70 72  FEV1-Pre L 1.77 1.54 1.26 1.63  FEV1-Predicted Pre % 90 78 63 81  FEV1-Post L 1.93 1.95 1.44 1.82  DLCO uncorrected ml/min/mmHg - - - 17.59  DLCO UNC% % - - - 69  DLCO corrected ml/min/mmHg - - - 18.36  DLCO COR %Predicted % - - - 72  DLVA Predicted % - - - 97  TLC L - - - 5.97  TLC % Predicted % - - - 115  RV % Predicted % - - - 143    Lab Results  Component Value Date   NITRICOXIDE 24 07/27/2018        Assessment & Plan:   Severe persistent asthma Recent exacerbation now improved.  Patient is continue on her current maintenance regimen. We will follow-up in 4 months and as needed  Allergic rhinitis Continue on current preventative regimen.  And trigger prevention     Rexene Edison, NP 08/17/2020

## 2020-08-17 NOTE — Assessment & Plan Note (Signed)
Continue on current preventative regimen.  And trigger prevention

## 2020-08-23 ENCOUNTER — Other Ambulatory Visit: Payer: Self-pay | Admitting: Pharmacy Technician

## 2020-08-27 DIAGNOSIS — R0982 Postnasal drip: Secondary | ICD-10-CM | POA: Diagnosis not present

## 2020-08-27 DIAGNOSIS — J310 Chronic rhinitis: Secondary | ICD-10-CM | POA: Diagnosis not present

## 2020-08-29 ENCOUNTER — Ambulatory Visit: Payer: Medicare Other

## 2020-09-11 DIAGNOSIS — I2510 Atherosclerotic heart disease of native coronary artery without angina pectoris: Secondary | ICD-10-CM | POA: Diagnosis not present

## 2020-09-11 DIAGNOSIS — D692 Other nonthrombocytopenic purpura: Secondary | ICD-10-CM | POA: Diagnosis not present

## 2020-09-11 DIAGNOSIS — J45909 Unspecified asthma, uncomplicated: Secondary | ICD-10-CM | POA: Diagnosis not present

## 2020-09-11 DIAGNOSIS — E041 Nontoxic single thyroid nodule: Secondary | ICD-10-CM | POA: Diagnosis not present

## 2020-09-11 DIAGNOSIS — F419 Anxiety disorder, unspecified: Secondary | ICD-10-CM | POA: Diagnosis not present

## 2020-09-11 DIAGNOSIS — M810 Age-related osteoporosis without current pathological fracture: Secondary | ICD-10-CM | POA: Diagnosis not present

## 2020-09-11 DIAGNOSIS — I129 Hypertensive chronic kidney disease with stage 1 through stage 4 chronic kidney disease, or unspecified chronic kidney disease: Secondary | ICD-10-CM | POA: Diagnosis not present

## 2020-09-11 DIAGNOSIS — N1831 Chronic kidney disease, stage 3a: Secondary | ICD-10-CM | POA: Diagnosis not present

## 2020-09-11 DIAGNOSIS — I10 Essential (primary) hypertension: Secondary | ICD-10-CM | POA: Diagnosis not present

## 2020-09-11 DIAGNOSIS — I700 Atherosclerosis of aorta: Secondary | ICD-10-CM | POA: Diagnosis not present

## 2020-09-11 DIAGNOSIS — Z853 Personal history of malignant neoplasm of breast: Secondary | ICD-10-CM | POA: Diagnosis not present

## 2020-09-11 DIAGNOSIS — I82409 Acute embolism and thrombosis of unspecified deep veins of unspecified lower extremity: Secondary | ICD-10-CM | POA: Diagnosis not present

## 2020-09-12 ENCOUNTER — Ambulatory Visit: Payer: Medicare Other | Admitting: Cardiovascular Disease

## 2020-09-12 ENCOUNTER — Ambulatory Visit: Payer: Medicare Other

## 2020-09-12 DIAGNOSIS — E039 Hypothyroidism, unspecified: Secondary | ICD-10-CM | POA: Diagnosis not present

## 2020-09-12 DIAGNOSIS — R946 Abnormal results of thyroid function studies: Secondary | ICD-10-CM | POA: Diagnosis not present

## 2020-09-12 DIAGNOSIS — E785 Hyperlipidemia, unspecified: Secondary | ICD-10-CM | POA: Diagnosis not present

## 2020-09-12 DIAGNOSIS — E876 Hypokalemia: Secondary | ICD-10-CM | POA: Diagnosis not present

## 2020-09-12 DIAGNOSIS — D509 Iron deficiency anemia, unspecified: Secondary | ICD-10-CM | POA: Diagnosis not present

## 2020-09-12 DIAGNOSIS — R7301 Impaired fasting glucose: Secondary | ICD-10-CM | POA: Diagnosis not present

## 2020-09-22 ENCOUNTER — Other Ambulatory Visit: Payer: Self-pay | Admitting: Cardiovascular Disease

## 2020-09-24 ENCOUNTER — Other Ambulatory Visit: Payer: Self-pay | Admitting: Adult Health

## 2020-09-24 DIAGNOSIS — J4550 Severe persistent asthma, uncomplicated: Secondary | ICD-10-CM

## 2020-09-24 NOTE — Progress Notes (Signed)
I connected by phone with [name] on 09/24/2020, 1:22 PM to discuss the potential use of a new treatment, tixagevimab/cilgavimab, for pre-exposure prophylaxis for prevention of coronavirus disease 2019 (COVID-19) caused by the SARS-CoV-2 virus.  This patient is a 87 y.o. female that meets the FDA criteria for Emergency Use Authorization of tixagevimab/cilgavimab for pre-exposure prophylaxis of COVID-19 disease. Pt meets following criteria:  Age >12 yr and weight > 40kg  Not currently infected with SARS-CoV-2 and has no known recent exposure to an individual infected with SARS-CoV-2 AND o Who has moderate to severe immune compromise due to a medical condition or receipt of immunosuppressive medications or treatments and may not mount an adequate immune response to COVID-19 vaccination or  o Vaccination with any available COVID-19 vaccine, according to the approved or authorized schedule, is not recommended due to a history of severe adverse reaction (e.g., severe allergic reaction) to a COVID-19 vaccine(s) and/or COVID-19 vaccine component(s).  o Patient meets the following definition of mod-severe immune compromised status: 8. Other groups not previously mentioned: 4. Unable to take COVID vaccine  I have spoken and communicated the following to the patient or parent/caregiver regarding COVID monoclonal antibody treatment:  1. FDA has authorized the emergency use of tixagevimab/cilgavimab for the pre-exposure prophylaxis of COVID-19 in patients with moderate-severe immunocompromised status, who meet above EUA criteria.  2. The significant known and potential risks and benefits of COVID monoclonal antibody, and the extent to which such potential risks and benefits are unknown.  3. Information on available alternative treatments and the risks and benefits of those alternatives, including clinical trials.  4. The patient or parent/caregiver has the option to accept or refuse COVID monoclonal  antibody treatment.  After reviewing this information with the patient, agree to receive tixagevimab/cilgavimab.    Scot Dock, NP, 09/24/2020, 1:22 PM

## 2020-09-26 ENCOUNTER — Ambulatory Visit: Payer: Medicare Other

## 2020-10-09 ENCOUNTER — Other Ambulatory Visit: Payer: Self-pay | Admitting: Cardiovascular Disease

## 2020-10-10 ENCOUNTER — Ambulatory Visit: Payer: Medicare Other

## 2020-10-13 ENCOUNTER — Other Ambulatory Visit: Payer: Self-pay | Admitting: Cardiovascular Disease

## 2020-10-14 ENCOUNTER — Other Ambulatory Visit: Payer: Self-pay | Admitting: Internal Medicine

## 2020-10-15 ENCOUNTER — Ambulatory Visit: Payer: Medicare Other | Admitting: Family

## 2020-10-16 ENCOUNTER — Other Ambulatory Visit: Payer: Self-pay | Admitting: Internal Medicine

## 2020-10-24 ENCOUNTER — Ambulatory Visit: Payer: Medicare Other

## 2020-10-26 DIAGNOSIS — H02834 Dermatochalasis of left upper eyelid: Secondary | ICD-10-CM | POA: Diagnosis not present

## 2020-10-26 DIAGNOSIS — H0102B Squamous blepharitis left eye, upper and lower eyelids: Secondary | ICD-10-CM | POA: Diagnosis not present

## 2020-10-26 DIAGNOSIS — H0102A Squamous blepharitis right eye, upper and lower eyelids: Secondary | ICD-10-CM | POA: Diagnosis not present

## 2020-10-26 DIAGNOSIS — H02831 Dermatochalasis of right upper eyelid: Secondary | ICD-10-CM | POA: Diagnosis not present

## 2020-10-26 DIAGNOSIS — E119 Type 2 diabetes mellitus without complications: Secondary | ICD-10-CM | POA: Diagnosis not present

## 2020-10-26 DIAGNOSIS — H04123 Dry eye syndrome of bilateral lacrimal glands: Secondary | ICD-10-CM | POA: Diagnosis not present

## 2020-10-26 DIAGNOSIS — H40013 Open angle with borderline findings, low risk, bilateral: Secondary | ICD-10-CM | POA: Diagnosis not present

## 2020-10-26 DIAGNOSIS — Z961 Presence of intraocular lens: Secondary | ICD-10-CM | POA: Diagnosis not present

## 2020-10-26 DIAGNOSIS — H353131 Nonexudative age-related macular degeneration, bilateral, early dry stage: Secondary | ICD-10-CM | POA: Diagnosis not present

## 2020-10-26 DIAGNOSIS — H18513 Endothelial corneal dystrophy, bilateral: Secondary | ICD-10-CM | POA: Diagnosis not present

## 2020-10-26 DIAGNOSIS — H43813 Vitreous degeneration, bilateral: Secondary | ICD-10-CM | POA: Diagnosis not present

## 2020-10-30 ENCOUNTER — Ambulatory Visit (INDEPENDENT_AMBULATORY_CARE_PROVIDER_SITE_OTHER): Payer: Medicare Other | Admitting: Family

## 2020-10-30 ENCOUNTER — Other Ambulatory Visit: Payer: Self-pay

## 2020-10-30 ENCOUNTER — Encounter: Payer: Self-pay | Admitting: Family

## 2020-10-30 VITALS — BP 120/60 | HR 84 | Ht 65.0 in | Wt 151.0 lb

## 2020-10-30 DIAGNOSIS — R0789 Other chest pain: Secondary | ICD-10-CM

## 2020-10-30 DIAGNOSIS — I10 Essential (primary) hypertension: Secondary | ICD-10-CM | POA: Diagnosis not present

## 2020-10-30 DIAGNOSIS — Z86718 Personal history of other venous thrombosis and embolism: Secondary | ICD-10-CM | POA: Diagnosis not present

## 2020-10-30 DIAGNOSIS — Z7901 Long term (current) use of anticoagulants: Secondary | ICD-10-CM

## 2020-10-30 DIAGNOSIS — I200 Unstable angina: Secondary | ICD-10-CM

## 2020-10-30 DIAGNOSIS — I5032 Chronic diastolic (congestive) heart failure: Secondary | ICD-10-CM | POA: Diagnosis not present

## 2020-10-30 DIAGNOSIS — E782 Mixed hyperlipidemia: Secondary | ICD-10-CM

## 2020-10-30 NOTE — Progress Notes (Signed)
Office Visit    Patient Name: [name] Date of Encounter: 10/30/2020  PCP:  Crist Infante, [place]  Cardiologist:  Mertie Moores, MD  Advanced Practice Provider:  No care team member to display Electrophysiologist:  None   Chief Complaint    [name] is a 87 y.o. female with a hx of chest pain with normal coronary arteries by catheterization in 2008, breast cancer s/p right mastectomy 1989 and left mastectomy 1990, hypertension, hyperlipidemia, severe persistent asthma, recurrent DVT on chronic anticoagulation, chronic diastolic heart failure presents today for follow-up of chest pain  Past Medical History    Past Medical History:  Diagnosis Date   Anemia    after hysterectomy   Angio-edema    Asthma, severe persistent    pulmologist-- dr Lynford Citizen   Cancer [facility])    Breast cancer on right   Chronic kidney disease, stage II (mild)    DDD (degenerative disc disease), cervical    Diverticulitis    Diverticulosis yrs ago   hx of    Edema, lower extremity    occ both legs swell   GERD (gastroesophageal reflux disease)    Headache    sinus headaches   Heart murmur    no problems - per pt   History of breast cancer 1990 left mastectomy also   1989  S/P   RIGHT MASTECTOMY ;  NO CHEMORADIATION //   NO RECURRENCE   History of DVT of lower extremity 5 yrs ago   right leg   History of shingles    Hyperlipidemia    Internal hemorrhoid    Leg ulcer, left (HCC)    Osteoporosis, unspecified    Knee and hip osteoarthritis bilaterally   Perennial allergic rhinitis    Peripheral vascular disease (HCC)    Pneumonia    Restless legs syndrome (RLS)    Rheumatoid arthritis ([place])    hands   Thyroid nodule    followed by dr perrini yearly, no current problam   Unspecified essential hypertension    Urticaria    Past Surgical History:  Procedure Laterality Date   CARDIAC CATHETERIZATION  08/ 01/ 2008   dr Cathie Olden   normal -- EF of  65%   CARDIOVASCULAR STRESS TEST  05-22-2011   dr Cathie Olden   normal lexiscan perfusion study/  no ischemia/  lvsf  86%   CATARACT EXTRACTION W/ INTRAOCULAR LENS  IMPLANT, BILATERAL     CHOLECYSTECTOMY  1993   laparoscopic   CONVERSION TO TOTAL HIP Left 12/03/2017   Procedure: CONVERSION TO LEFT TOTAL HIP;  Surgeon: Paralee Cancel, MD;  Location: [facility];  Service: Orthopedics;  Laterality: Left;  90 mins   HIP ARTHROPLASTY Left 12/22/2016   Procedure: HEMIARTHROPLASTY, LEFT;  Surgeon: Paralee Cancel, MD;  Location: [facility];  Service: Orthopedics;  Laterality: Left;   INCISION AND DRAINAGE OF WOUND Left 10/24/2013   Procedure: IRRIGATION AND DEBRIDEMENT OF LEFT LEG WITH PLACEMENT OF ACELL AND WOUND [place];  Surgeon: Theodoro Kos, DO;  Location: [place];  Service: Plastics;  Laterality: Left;   KNEE ARTHROPLASTY     LUMBAR [place] Bilateral rigth 1989///   left  1990   breast cancer 1989//   fibrocytic disease 1990   ORIF HUMERUS FRACTURE Right 09/21/2018   Procedure: OPEN REDUCTION INTERNAL FIXATION (ORIF) PROXIMAL HUMERUS FRACTURE;  Surgeon: Nicholes Stairs, MD;  Location: [place];  Service: Orthopedics;  Laterality: Right;  90 mins   TOTAL ABDOMINAL HYSTERECTOMY W/ BILATERAL SALPINGOOPHORECTOMY  1989   done at same time as right mastectomy   TOTAL KNEE ARTHROPLASTY Right 10/05/2012   Procedure: RIGHT TOTAL KNEE ARTHROPLASTY;  Surgeon: Mauri Pole, MD;  Location: [facility];  Service: Orthopedics;  Laterality: Right;    Allergies  Allergies  Allergen Reactions   Egg Phospholipids    Eggs Or Egg-Derived Products     UNSPECIFIED REACTION  RAW EGGS.. Can eat cooked eggs   Influenza Vaccines     UNSPECIFIED REACTION  Egg allergy    Nucala [Mepolizumab]     UNSPECIFIED REACTION    Cephalosporins Rash   Codeine Rash   Gabapentin Nausea Only   Levofloxacin Rash   Pneumococcal Vaccines Rash    History of Present Illness    [name] is a 87 y.o. female with a hx of chest pain with normal coronary arteries by catheterization in 2008, breast cancer s/p right mastectomy 1989 and left mastectomy 1990, hypertension, hyperlipidemia, severe persistent asthma, recurrent DVT on chronic anticoagulation, chronic diastolic heart failure last seen 06/15/2020 by Dr. Acie Fredrickson.  She has had multiple work-ups for chest pain with cardiac catheterization 2000 AH with no coronary atherosclerosis.  Myoview 2017 with normal LVEF and no evidence of ischemia.  She has been followed closely related to her diastolic heart failure.  Most recent echocardiogram August 2020 LVEF 55 to 30%, LV diastolic Doppler parameters consistent with impaired relaxation, no R WMA, RV normal size and function, LA mildly dilated, mild to moderate mitral annular calcification with no stenosis nor MR.  When seen 06/15/2020 she noted that she was waking up with chest pain which was described as midsternal and sharp without radiation lasting for few minutes.  She was recommended for [facility] which was performed 06/21/2020 showing LVEF 67%, no ST segment deviation, no evidence of prior infarct or ischemia, low risk study.  Lab work 09/12/2020 ALT 22, AST 22, GFR 57, K3.9, Na 134, TSH 1.79, A1c 6,  She presents today for follow-up.  Tells me since last seen 4 months ago she has had 2 episodes of chest pain which occurred at rest and resolved with 1 nitroglycerin.  Tells me this is overall infrequent and not bothersome.  Reports no shortness of breath at rest and that her dyspnea on exertion is stable at baseline.  No cough, wheeze.  No edema, orthopnea, PND.  Reports no lightheadedness, dizziness, near-syncope, syncope.  EKGs/Labs/Other Studies Reviewed:   The following studies were reviewed today:  Lexiscan Myoview 06/2020 The left ventricular ejection fraction is hyperdynamic (>65%). Nuclear stress EF: 67%. There was no ST segment deviation noted during stress. The  study is normal. This is a low risk study.   Normal pharmacologic nuclear stress test with no evidence for prior infarct or ischemia. Normal LVEF.   Echo 12/2018  1. The left ventricle has normal systolic function, with an ejection  fraction of 55-60%. The cavity size was normal. Left ventricular diastolic  Doppler parameters are consistent with impaired relaxation. No evidence of  left ventricular regional wall  motion abnormalities.   2. The right ventricle has normal systolic function. The cavity was  normal. There is no increase in right ventricular wall thickness.   3. Left atrial size was mildly dilated.   4. There is mild to moderate mitral annular calcification present. No  evidence of mitral valve stenosis. No mitral regurgitation.   5. The aortic  valve is tricuspid. Mild calcification of the aortic valve.  No stenosis of the aortic valve.   6. The aorta is normal unless otherwise noted.   7. Normal IVC size. No complete TR doppler jet so unable to estimate PA  systolic pressure.   EKG: No EKG today  Recent Labs: No results found for requested labs within last 8760 hours.  Recent Lipid Panel    Component Value Date/Time   CHOL 186 03/19/2017 1009   TRIG 57 03/19/2017 1009   HDL 114 03/19/2017 1009   CHOLHDL 1.6 03/19/2017 1009   CHOLHDL 2 02/23/2014 0907   VLDL 14.8 02/23/2014 0907   LDLCALC 61 03/19/2017 1009    Home Medications   Current Meds  Medication Sig   acetaminophen (TYLENOL) 500 MG tablet Take 2 tablets (1,000 mg total) by mouth every 8 (eight) hours.   albuterol (PROAIR HFA) 108 (90 Base) MCG/ACT inhaler Inhale 2 puffs into the lungs every 6 (six) hours as needed for wheezing or shortness of breath.   albuterol (PROVENTIL) (2.5 MG/3ML) 0.083% nebulizer solution Take 3 mLs (2.5 mg total) by nebulization every 6 (six) hours as needed for wheezing or shortness of breath. Dx: J45.41   benzonatate (TESSALON) 100 MG capsule Take 1 capsule (100 mg total) by  mouth 3 (three) times daily as needed for cough.   budesonide-formoterol (SYMBICORT) 160-4.5 MCG/ACT inhaler Inhale 2 puffs into the lungs 2 (two) times daily.   Calcium Carbonate-Vitamin D 600-400 MG-UNIT tablet Take 1 tablet by mouth daily.   chlorpheniramine (CHLOR-TRIMETON) 4 MG tablet Take 4-8 mg by mouth See admin instructions. 4 mg in the morning and 8 mg at bedtime   Cholecalciferol (VITAMIN D3) 1000 UNITS tablet Take 1,000 Units by mouth every evening.    dextromethorphan (DELSYM) 30 MG/5ML liquid Take 60 mg by mouth 2 (two) times daily as needed for cough.   dextromethorphan-guaiFENesin (MUCINEX DM) 30-600 MG 12hr tablet Take 1 tablet by mouth daily as needed for cough.   Dupilumab (DUPIXENT) 300 MG/2ML SOPN Inject 300 mg into the skin every 14 (fourteen) days. Deliver to patient's home for self-administration   fexofenadine (ALLEGRA) 180 MG tablet Take 180 mg by mouth daily.     furosemide (LASIX) 20 MG tablet TAKE 1 TABLET (20 MG TOTAL) BY MOUTH DAILY AS NEEDED FOR FLUID OR EDEMA.   ibandronate (BONIVA) 150 MG tablet Take 1 tablet by mouth every 30 (thirty) days.    ipratropium (ATROVENT) 0.06 % nasal spray Place 2 sprays into both nostrils 2 (two) times daily.   mometasone (NASONEX) 50 MCG/ACT nasal spray USE 2 SPRAYS NASALLY 2 TIMES DAILY   montelukast (SINGULAIR) 10 MG tablet TAKE 1 TABLET BY MOUTH EVERY NIGHT AT BEDTIME   Multiple Vitamin (MULTIVITAMIN WITH MINERALS) TABS tablet Take 1 tablet by mouth daily. Centrum Silver Multivitamin   Multiple Vitamins-Minerals (PRESERVISION AREDS PO) Take 1 tablet by mouth 2 (two) times daily.   mupirocin ointment (BACTROBAN) 2 % as needed.    NEXIUM 40 MG capsule TAKE 1 CAPSULE BY MOUTH TWICE A DAY   nitroGLYCERIN (NITROSTAT) 0.4 MG SL tablet Place 1 tablet (0.4 mg total) under the tongue every 5 (five) minutes as needed for chest pain.   potassium chloride (KLOR-CON) 10 MEQ tablet TAKE 1 TABLET BY MOUTH EVERY DAY   pramipexole (MIRAPEX)  0.125 MG tablet Take 0.375 mg by mouth at bedtime.   rivaroxaban (XARELTO) 10 MG TABS tablet Take 10 mg by mouth daily.   rosuvastatin (CRESTOR) 5  MG tablet TAKE 1 TABLET BY MOUTH EVERYDAY AT BEDTIME   Spacer/Aero-Holding Chambers (AEROCHAMBER MV) inhaler by Other route. Use as instructed    telmisartan (MICARDIS) 20 MG tablet Take 20 mg by mouth daily.    vitamin B-12 (CYANOCOBALAMIN) 500 MCG tablet Take 500 mcg by mouth daily.       Review of Systems   All other systems reviewed and are otherwise negative except as noted above.  Physical Exam    VS:  BP 120/60 (BP Location: Left Arm, Patient Position: Sitting, Cuff Size: Normal)   Pulse 84   Ht 5\' 5"  (1.651 m)   Wt 151 lb (68.5 kg)   SpO2 94%   BMI 25.13 kg/m  , BMI Body mass index is 25.13 kg/m.  Wt Readings from Last 3 Encounters:  10/30/20 151 lb (68.5 kg)  08/17/20 143 lb 9.6 oz (65.1 kg)  08/08/20 145 lb 3.2 oz (65.9 kg)     GEN: Well nourished, well developed, in no acute distress. HEENT: normal. Neck: Supple, no JVD, carotid bruits, or masses. Cardiac: RRR, no murmurs, rubs, or gallops. No clubbing, cyanosis, edema.  Radials/PT 2+ and equal bilaterally.  Respiratory:  Respirations regular and unlabored, clear to auscultation bilaterally. GI: Soft, nontender, nondistended. MS: No deformity or atrophy. Skin: Warm and dry, no rash. Neuro:  Strength and sensation are intact. Psych: Normal affect.  Assessment & Plan    Chest pain-Lexiscan Myoview 06/2020 low risk study. Reports two episodes of chest pain over the last 4 months that occurred at rest and resolved with 1 nitroglycerin.  Overall not bothersome and resolve very quickly.  Atypical for angina as it occurs at rest.  Reports no worsening dyspnea on exertion.  No indication for further ischemic evaluation at this time.  Anticipate the etiology of her chest pain is coronary spasm versus musculoskeletal versus anxiety.  If she has worsening symptoms could consider  addition of long-acting Imdur or amlodipine.  HTN- BP well controlled. Continue current antihypertensive regimen.   Hyperlipidemia-02/2020 LDL 68. Continue Crestor 5mg  daily.    Asthma- No signs of acute exacerbation. Continue to follow with pulmonology.   Chronic diastolic heart failure- Euvolemic and well compensated on exam. NHA II. Continue Lasix 20mg  daily, Telmisartan.   Recurrent DVT on chronic anticoagulation -  Denies bleeding complications. Continue Xarelto 10mg  daily. Continue to follow with PCP.    Disposition: Follow up in 6 month(s) with Dr. Acie Fredrickson or APP.  Signed, Loel Dubonnet, NP 10/30/2020, 10:14 AM [place]

## 2020-10-30 NOTE — Patient Instructions (Addendum)
Medication Instructions:  Continue your current medications.   *If you need a refill on your cardiac medications before your next appointment, please call your pharmacy*   Lab Work: None ordered today  Testing/Procedures: Your stress test showed no evidence of blockage which is ag reat result!   Follow-Up: At [facility], you and your health needs are our priority.  As part of our continuing mission to provide you with exceptional heart care, we have created designated Provider Care Teams.  These Care Teams include your primary Cardiologist (physician) and Advanced Practice Providers (APPs -  Physician Assistants and Nurse Practitioners) who all work together to provide you with the care you need, when you need it.  We recommend signing up for the patient portal called "MyChart".  Sign up information is provided on this After Visit Summary.  MyChart is used to connect with patients for Virtual Visits (Telemedicine).  Patients are able to view lab/test results, encounter notes, upcoming appointments, etc.  Non-urgent messages can be sent to your provider as well.   To learn more about what you can do with MyChart, go to [email or web address].    Your next appointment:   6 month(s)   04/24/2021 ARRIVE 10:05  The format for your next appointment:   In Person  Provider:   You may see Mertie Moores, MD or one of the following Advanced Practice Providers on your designated Care Team:   Richardson Dopp, PA-C [place], [place]  Other Instructions  Heart Healthy Diet Recommendations: A low-salt diet is recommended. Meats should be grilled, baked, or boiled. Avoid fried foods. Focus on lean protein sources like fish or chicken with vegetables and fruits. The American Heart Association is a Microbiologist!  American Heart Association Diet and Lifeystyle Recommendations   Exercise recommendations: The American Heart Association recommends 150 minutes of moderate intensity exercise  weekly. Try 30 minutes of moderate intensity exercise 4-5 times per week. This could include walking, jogging, or swimming.

## 2020-11-07 ENCOUNTER — Ambulatory Visit: Payer: Medicare Other

## 2020-11-21 ENCOUNTER — Ambulatory Visit: Payer: Medicare Other

## 2020-11-27 DIAGNOSIS — M810 Age-related osteoporosis without current pathological fracture: Secondary | ICD-10-CM | POA: Diagnosis not present

## 2020-12-05 ENCOUNTER — Ambulatory Visit: Payer: Medicare Other

## 2020-12-06 DIAGNOSIS — M810 Age-related osteoporosis without current pathological fracture: Secondary | ICD-10-CM | POA: Diagnosis not present

## 2020-12-19 ENCOUNTER — Ambulatory Visit: Payer: Medicare Other

## 2020-12-26 ENCOUNTER — Other Ambulatory Visit: Payer: Self-pay

## 2020-12-26 ENCOUNTER — Telehealth: Payer: Self-pay

## 2020-12-26 ENCOUNTER — Ambulatory Visit (INDEPENDENT_AMBULATORY_CARE_PROVIDER_SITE_OTHER): Payer: Medicare Other | Admitting: Internal Medicine

## 2020-12-26 ENCOUNTER — Encounter: Payer: Self-pay | Admitting: Internal Medicine

## 2020-12-26 VITALS — BP 124/72 | HR 85 | Temp 98.0°F | Ht 65.0 in | Wt 143.6 lb

## 2020-12-26 DIAGNOSIS — R0982 Postnasal drip: Secondary | ICD-10-CM | POA: Diagnosis not present

## 2020-12-26 DIAGNOSIS — I200 Unstable angina: Secondary | ICD-10-CM

## 2020-12-26 DIAGNOSIS — Z7185 Encounter for immunization safety counseling: Secondary | ICD-10-CM

## 2020-12-26 DIAGNOSIS — J4550 Severe persistent asthma, uncomplicated: Secondary | ICD-10-CM

## 2020-12-26 DIAGNOSIS — Z7189 Other specified counseling: Secondary | ICD-10-CM | POA: Diagnosis not present

## 2020-12-26 MED ORDER — BUDESONIDE-FORMOTEROL FUMARATE 160-4.5 MCG/ACT IN AERO: 2.0000 | INHALATION_SPRAY | Freq: Two times a day (BID) | RESPIRATORY_TRACT | 11 refills | Status: DC

## 2020-12-26 MED ORDER — ALBUTEROL SULFATE HFA 108 (90 BASE) MCG/ACT IN AERS: 2.0000 | INHALATION_SPRAY | Freq: Four times a day (QID) | RESPIRATORY_TRACT | 5 refills | Status: DC | PRN

## 2020-12-26 NOTE — Addendum Note (Signed)
Addended by: Lorretta Harp on: 12/26/2020 03:17 PM   Modules accepted: Orders

## 2020-12-26 NOTE — Progress Notes (Signed)
C.C.:  Follow-up for Severe, Persistent Asthma, Chronic Allergic Rhinitis, DVT, & GERD w/ Hiatal Hernia.  HPI Patient has been seen twice in our office since her last appointment with me. Last visit was on 9/26 with nurse practitioner. Patient was treated for an exacerbation of her asthma with bronchitis. She was given a prednisone taper, instructed to use Mucinex twice daily as needed, and a 7 day course of Augmentin. Since her last appointment she fractured her left hip. She is continuing to walk with a cane. She reports no adverse effects from her Xolair. She does feel the Xolair is helping her significantly. Hasn't needed her rescue inhaler significantly in the last few weeks. Only rare nocturnal awakenings with any dyspnea.   Severe, persistent asthma: Patient currently maintained on a regimen of Xolair, Singulair, Spiriva, and Symbicort. She feels her dyspnea has significantly improved. She still has a scratchy throat & hoarse voice. She has her baseline nonproductive cough. No wheezing.  Chronic allergic rhinitis: Previously referred to ENT. Regimen at last appointment with me included Allegra, Singulair, Nasonex, and Xolair. Patient has had multiple different antibiotic courses for recurrent sinusitis. Only having intermittent sinus congestion & drainage.   DVT: Present in bilateral lower extremities. Patient on systemic anticoagulation with Xarelto indefinitely. No melena or hematochezia. No hematuria.   GERD with hiatal hernia: Previously prescribed Nexium. No reflux or dyspepsia. No morning brash water taste.   Review of Systems  No chest pain or tightness. No fever or chills. No abdominal pain or nausea.    OV 06/15/2017  Chief Complaint  Patient presents with   Follow-up    flare ups with asthma when the weather changes.  Right now it is a cough, with congestions and wheezing. Non-productive dry cough. SOB with any activity.  Hoarseness.   87 year old female.  Transfer of  care from Dr. Ashok Cordia who is left the practice.  Moderate persistent asthma with an elevated IgE.  She is here with her husband.  She tells me that in 2015-2017 she was started on Xolair and then subsequently switched in [place] last year or year before last.  This then caused nonspecific side effect such as erythema in her face and nonspecific pain that reminded her of shingles.  Therefore she discontinued this and the side effects resolved.  She did have 6 doses of the interleukin-5 receptor antibody.  She went back on Xolair.  Overall she feels that since starting Xolair her quality of life and asthma exacerbations have improved but nevertheless she still gets asthma exacerbations every few months and has to go on prednisone.  Currently she states that for the last few to several days she has had increased cough although no change in sputum.  She is also wheezing a little bit more than usual.  Asthma control questionnaire shows for the last week she is waking up a few times at night.  When she wakes up she has moderate symptoms.  Her activities are slightly limited because of asthma.  She is moderate amount of shortness of breath because of asthma and she is wheezing a little of the time and using albuterol for rescue at least 1 or 2 times daily.  Exam nitric oxide is significantly elevated at 125 ppb.  She think she will benefit from prednisone  She she reports to me for the first time a new issue of left anterior cervical lymph node/submandibular lymph node.  She says it is been present for a while but other physicians  were examined it have not felt it.  She asked me to examine it.  She feels it is slowly growing.  Insidious onset for a year.   OV 08/13/2017  Chief Complaint  Patient presents with   Follow-up    Pt states she has had many flare-ups with her asthma. States she has had a lot of sinus drainage and congestion and a lot of chest tightness.    87 year old female with complicated asthma    poor control of asthma continues.  At my last visit she did a prednisone taper.  Then a few weeks ago her sinuses acted up and therefore her asthma acted up and she did another prednisone taper on herself at home but it was just for a few days.  She continues on Spiriva, Symbicort, Singulair, Nexium and Xolair injections.  She feels that ever since Dr. Asencion Noble retired her asthma has been out of control.  Currently for the last few weeks her sinuses have been acting up and she wants antibiotics.  In particular she wants Augmentin.  She says in the past this was deemed as an allergy but she went and saw some doctor in [place] and it looks like she has been desensitized.  Penicillin is no longer listed as an allergy.  She insists that she wants Augmentin.  Review of the chart shows she has had chronic sinusitis and a CT scan of the sinus 1 year ago in February 2018.  She says she has seen ENT for this.  No surgery has been recommended.  Just nasal hygiene and antibiotic courses.  Currently her symptoms are quite significant with asthma control question of greater than 2 it appears this might all just be her baseline.  Her exam nitric oxide is still high over 100.  At night she is waking up several times.  When she wakes up she has moderate symptoms.  Activities are slightly limited because of asthma.  She has moderate amount of shortness of breath because of asthma.  She is wheezing hardly and she uses albuterol for rescue at least 1 or 2 times daily.   OV  Chief Complaint  Patient presents with   Follow-up    Pt states her breathing has been much better since she began new injection but states she is coughing more than before. Denies any CP.    Follow-up severe persistent asthma with eosinophilia: In April 2019 we started dupulimab and stop the Xolair. With this asthma control is significantly better. Her asthma control questionnaire average score has drop to 0.6. Her exhaled nitric oxide is  drop from 100s to 43. She says she is much less short of breath and is able to do a lot more things. She is very pleased with the new injectable. However she feels that her baseline chronic cough is slightly worse. It is moderate in intensity is dry. It does not wake up at night. It is not associated with wheezing or shortness of breath. It is associated with Spiriva MDI. She does clear her throat. Coughing is made worse by talking. Off note, she has new issue of left hip surgery coming up in 2 days. She feels from a pulmonary standpoint she will be stable to do the surgery because of prior experience with surgery.        OV 03/08/2018  Subjective:  Patient ID: [name], female , DOB: Jan 29, 1933 , age 25 y.o. , MRN: [id number] , ADDRESS: [place] [address]  03/08/2018 -   Chief Complaint  Patient presents with   Follow-up    Sinus draniage and more cough    HPI Twila Dwyer 87 y.o. -returns for asthma follow-up.  However the issue today is that she reports worsening of sinus drainage for the last 1 month ever since the fall weather change.  He says the sinus drainage is so significant that it is making her cough.  She feels asthma itself is under control.  In fact exam nitric oxide today is 38.  She is continuing inhalers Singulair Nexium and injection dupilumab.  She refused flu shot because she is allergic to it.  Review of the chart indicates March 2018 Dr. Ashok Cordia did a CT scan of the sinus and it showed pansinusitis.  She is says she is seen ENT from 2 years ago and is unclear if it was before the scan or after the scan.  At this point in time she just wants relief from her current sinus issue.  The sinus drainage is possibly clear but she has not looked at it.  There is no fever worsening wheeze or chest tightness.  05/17/2018 Patient presents today with acute complaints of cough and congestion. Accompanied by husband. Productive cough with clear mucus since  05/05/18. Husband and brother were both sick. She was seen at [facility] on 12/29 and given steroid injection and amoxicillin course. Finished antibiotic 2 days ago. Did not notice much of an improvement, if any. She has taken mucinex and delsym for cough. Hasn't had to use rescue inhaler or nebulizer. Due for Dupixent injection on 05/25/18. Did not receive flu injection d/t allergy. Afebrile.   05/25/2018 Patient presents for 1 week follow-up for asthma exacerbation. CXR showed no acute cardiopulmonary disease. Treated with prednisone taper. Due for dupixent injection today. Feeling better than she did last week, still has a cough but sputum has cleared. Felt better yesterday, rain made her symptoms worse. States anytime the weather changes her asthma flares. Using nebulizer as needed, didn't use it as much yesterday. Required a treatment this morning. Takes Singulair and allegra. Afebrile.       OV 06/08/2018  Subjective:  Patient ID: [name], female , DOB: 13-Nov-1932 , age 39 y.o. , MRN: [id number] , ADDRESS: [place] [address]   06/08/2018 -   Chief Complaint  Patient presents with   Follow-up    Pt states she has had sinus problems, cough with clear mucus, and has also had some chest pain/tightness. Pt has been seen by Derl Barrow for asthma exacerbations a couple different times. Pt denies any fever.   Severe asthma on Dupixent.  Also with chronic sinusitis  HPI Colleene Zaremba 87 y.o. -returns for follow-up of her issues.  I last saw her in October 2019.  After that earlier this month she came to see nurse practitioner 2 times for acute on chronic sinus issues.  She tells me that she is tolerating her Dupixent injections just fine.  She feels it is helping her.  However, she is bothered by sinus issues this constantly sinus drainage particularly flared up in January 2020.  She recollects seeing Dr. Redmond Baseman in ENT.  At the time we recommended amoxicillin but she was allergic  to it and no antibiotic therefore was prescribed according to history.  Since then she has seen an allergist in her local area and is been cleared to get amoxicillin.  She is okay seeing her ENT but she wants to change to  her husband's ENT surgeon Dr. Elgie Congo.  Early this month with a sinus flush she got antibiotic and prednisone this helped her.  However she still has significant amount of residual symptoms.  Asthma control question is 2.8 but the nitric oxide level is normal.  This was excessive symptom burden.  She is waking up several times at night.  When she wakes up she has moderate symptoms she is moderately limited in her activities.  Moderate amount of shortness of breath and wheezing a little of the time and using albuterol for rescue at least 1 time daily.  She says is unimproved symptomatology because of the recent antibiotic and prednisone.  She feels she needs another course of antibiotic and prednisone as well.  In addition with the cough she is complaining of musculoskeletal pain in the upper chest and the back.  She feels the lungs are hurting.  There is no radiation.  The course is improving.  Of note, her past medical history lists her as having rheumatoid arthritis but she denies that to be the case.  She says she has osteoarthritis.  She does not see a rheumatologist.  She has had no prior immune work-up.    OV 02/28/2019  Subjective:  Patient ID: [name], female , DOB: March 25, 1933 , age 36 y.o. , MRN: [id number] , ADDRESS: [place] [address]   02/28/2019 -   Chief Complaint  Patient presents with   Follow-up    She reports her breathing has been at her baseline.Allergy to eggs, no flu shot. Reports her dupixent has been great.      HPI Sadey Gotham 87 y.o. -  Follow-up chronic severe persistent asthma: She is now on Dupixent, Symbicort, Spiriva and Singulair.  She says her asthma is well under control.  Albuterol rescue use is rare.  No  nocturnal awakenings.  She has deferred flu shot because she states "I cannot take it"   Follow-up chronic sinusitis: She has seen ENT Dr. Lorelee Cover and has been prescribed Nasonex.  This is now under control.  New issue of ANA positivity 1: 1280 -in January 2020 because of chronic sinusitis I have ordered autoimmune and vasculitis profile.  Her ANCA profile is negative.  However her ANA is strongly positive.  She admits to having osteoarthritis and osteoporosis.  She denies she has rheumatoid arthritis although this is mentioned in her chart as past medical history.  She denies having seen a rheumatologist.  Review of the chart does not indicate she has had rheumatoid factor tested with an health system.    ROS - per HPI   OV 07/11/2019  Subjective:  Patient ID: [name], female , DOB: 06-30-1932 , age 70 y.o. , MRN: [id number] , ADDRESS: [place] [address]   07/11/2019 -   Chief Complaint  Patient presents with   Follow-up    Pt states she has been doing good since last visit. States she does have an occ cough.     HPI Jerene Morriss 87 y.o. -  Follow-up chronic severe asthma: Last seen in October 2020.  Since then she continues with her durvalumab on a scheduled basis.  She visits our office for that.  However she has become less compliant with the Symbicort.  She for the month of February 2021 she is only taken it 2 times.  She also takes Spiriva only rarely.  She takes these 2 on a as needed basis.  She is compliant with her Singulair  on a daily basis.  Albuterol is also as needed.  Today because of the weather changes rainy and warm she feels a remote tightness in her chest.  We do not have exam nitric oxide test.  Asthma control question is 0.8 and is at the upper limit of good control.  She feels the Dupixent has worked tremendously well for her  In terms of her chronic sinusitis: She follows with ENT and takes Nasonex   In terms of her strong positivity for  ANA: Last visit check extended autoimmune profile.  SSA was positive.  She then had a televisit in December 2020 with Dr. Keturah Barre.  I reviewed that note.  She has a face-to-face visit coming up in March 2021.  She does admit to dryness of the eyes.  She might have Sjogren's and I informed this to her.   In terms of vaccine counseling.  She has had allergy to flu shot.  She has other allergies documented below.  She does not want to do the COVID-19 vaccine.  She is social distancing.  She does outdoor church.  She wants to know when life will return to normal.  We discussed summer is a possibility.   OV 01/10/2020   Subjective:  Patient ID: [name], female , DOB: 10-18-1932, age 58 y.o. years. , MRN: [id number],  ADDRESS: [address] [place] [address] PCP  Crist Infante, MD Providers : Treatment Team:  Attending Provider: Brand Males, MD   Chief Complaint  Patient presents with   Follow-up    Severe Asthma       HPI [place] Metzger 87 y.o. -returns for follow-up of asthma.  Since her last visit she still remains Covid unvaccinated.  She is scared of the Covid vaccine because of egg allergy.  Because of egg allergy she normally does not do flu shot.  She has done other vaccines.  Asthma continues to be stable.  She is taking Dupixent at full dose every 2 weeks in our office.  She does not want to do this at home.  She does not want to reduce the dose.  She also takes Symbicort at 160/4.52 puff 2 times daily.  She is willing to reduce the dose for this ACT control score is 19 showing good control.  She recently relocated her sister who is 71 years old and has been doing chores.  She is trying to enjoy her [place] in  [place].  Of note even though ACT score is 19 and suggest poor control she tells me she barely ever wakes up in the middle of the night from asthma.  She is not using her albuterol for rescue.  She feels asthma is well  controlled.    Asthma Control Panel  10/11/15 - IgE - 425 , rest of blood allergy panel negative. Blood eos 300cells (al;so 300 on 12/19/16). CT Sinus feb 2018 - chronic pan sinusitis     07/02/2016 06/15/2017  08/13/2017  12/01/2017  06/08/2018  01/10/2020   Current Med Regimen  Spriva, symbicort,  Singulair, xolair, nexium same Spiriva, Symbicort, Singulair, Nexium and dupulimab Spiriva, Symbicort, Singulair, Nexium and dupulimab   Act - ACT - a GSK test - 4 week. Total score. Max is 25, Lower score is worse.  <19 = poor control      19  ACQ 5 point- 1 week. wtd avg score. <1.0 is good control 0.75-1.25 is grey zone. >1.25 poor control. Delta 0.5 is clinically  meaningful   2.4 0.6 2.9   FeNO ppB  126 ppb 104 ppb 43 19   FeV1  1.777/90%, ratio 74,        Planned intervention  for visit   HRCT, talk to eye doc and if ok start dupixent, augmenting, prednsione,        Asthma Control Test ACT Total Score  01/10/2020 19     OV 08/08/2020  Subjective:  Patient ID: [name], female , DOB: [birth year] , age 65 y.o. , MRN: [id number] , ADDRESS: [address] [place] [address] PCP Crist Infante, MD Patient Care Team: Crist Infante, MD as PCP - General (Internal Medicine) Nahser, Wonda Cheng, MD as PCP - Cardiology (Cardiology) Nahser, Wonda Cheng, MD as Consulting Physician (Cardiology)  This Provider for this visit: Treatment Team:  Attending Provider: Brand Males, MD    08/08/2020 -   Chief Complaint  Patient presents with   Follow-up    Pt states she has been doing good since last visit. States she did have a flare up with allergies 2 weeks ago and states that she is a little SOB today.     HPI Lummie Shedlock 87 y.o. - presents for asthma follow-up.  She tells me that 2 weeks ago because of fall in the spring she started having sinus complaints and since then she has had more chest tightness.  Last few days even cough.  Last visit she decreased her Symbicort at 50% of  dosage.  She feels she needs to go up on it.  She is compliant with Singulair and Dupixent.  She wants to do Dupixent at home.  Today she is feels that she needs help with the asthma because it is an exacerbation.  There is no fever no nocturnal awakening or chest pain orthopnea proximal nocturnal dyspnea.   OV April 2022  08/17/2020 Follow up : Asthma  Patient presents for a 1 week follow-up.  Patient was seen last week for a follow-up for asthma.  She was having more asthma symptoms with cough and wheezing.  She was given a prednisone taper.  Which she says she finished yesterday.  She is feeling much better.  Symptoms are returning back to her baseline.  She has minimum cough and wheezing.  She denies any fever or discolored mucus.  She remains on maintenance therapy with Symbicort twice daily.  She says she had only been taking 1 puff twice daily and now over the last week is increased back up to 2 puffs twice daily.  She is on Singulair daily.  And is on Dupixent.  She says since starting [place] she has felt so much better with her asthma.  And has had less exacerbations.  Patient is starting home injections soon.  And does endorse that she has a up-to-date EpiPen at home.  OV 12/26/2020  Subjective:  Patient ID: [name], female , DOB: [birth year] , age 80 y.o. , MRN: [id number] , ADDRESS: [address] [place] [address] PCP Crist Infante, MD Patient Care Team: Crist Infante, MD as PCP - General (Internal Medicine) Nahser, Wonda Cheng, MD as PCP - Cardiology (Cardiology) Nahser, Wonda Cheng, MD as Consulting Physician (Cardiology)  This Provider for this visit: Treatment Team:  Attending Provider: Brand Males, MD    12/26/2020 -   Chief Complaint  Patient presents with   Follow-up    Pt states she has a cough she can not get rid of also SOB. Pt states symbicort was not working  before running out of medication she not sure if it was because it was outdated.       HPI Amayrani Zinger 87 y.o. -follow-up asthma with sinus drainage.  She continues on Symbicort, Dupixent and Singulair.  She feels her asthma is at baseline even though ACT score shows significant symptoms.  She feels the symptoms are because of postnasal drainage for which she has seen ENT.  She is on nasal steroid, Tessalon cough spells, Chlor-Trimeton, Delsym and Mucinex.  She feels this is resistant to treatment.  This is a chronic problem.  She has seen ENT for this.  She is resigned to this being the baseline.  She says considering everything she is doing well.  She did have specific question about getting EVUSHELD.  She says in December 2020 when she took the [employer]  After that she got blood clot in her left lower extremity.  Review of the medication shows she is on Xarelto.  After that she has not taken mRNA vaccines.  She is nervous about it and she says she is allergic to those.  I do not know what type of allergy.  She says primary care physician referred her for EVUSHE:D but she says she was declined.  She is asking if it is a good idea.  I did indicate to her that given the fast of time and given the fact that she is reluctant to take the other vaccine and she has had issues with the [employer] vaccine but is probably a good idea to get monoclonal antibody prophylaxis particularly given the recent variatnts.  She is asking me to make a referral.       Asthma Control Panel  10/11/15 - IgE - 425 , rest of blood allergy panel negative. Blood eos 300cells (al;so 300 on 12/19/16). CT Sinus feb 2018 - chronic pan sinusitis     07/02/2016 06/15/2017  08/13/2017  12/01/2017  06/08/2018  01/10/2020  12/26/2020   Current Med Regimen  Spriva, symbicort,  Singulair, xolair, nexium same Spiriva, Symbicort, Singulair, Nexium and dupulimab Spiriva, Symbicort, Singulair, Nexium and dupulimab    Act - ACT - a GSK test - 4 week. Total score. Max is 25, Lower score is  worse.  <19 = poor control      19 16  ACQ 5 point- 1 week. wtd avg score. <1.0 is good control 0.75-1.25 is grey zone. >1.25 poor control. Delta 0.5 is clinically meaningful   2.4 0.6 2.9    FeNO ppB  126 ppb 104 ppb 43 19    FeV1  1.777/90%, ratio 74,         Planned intervention  for visit   HRCT, talk to eye doc and if ok start dupixent, augmenting, prednsione,          PFT  PFT Results Latest Ref Rng & Units 07/02/2016 01/16/2016 10/31/2015 08/03/2015  FVC-Pre L 2.39 2.26 1.88 2.29  FVC-Predicted Pre % 90 84 70 85  FVC-Post L 2.51 2.57 2.05 2.53  FVC-Predicted Post % 95 96 76 94  Pre FEV1/FVC % % 74 68 67 71  Post FEV1/FCV % % 77 76 70 72  FEV1-Pre L 1.77 1.54 1.26 1.63  FEV1-Predicted Pre % 90 78 63 81  FEV1-Post L 1.93 1.95 1.44 1.82  DLCO uncorrected ml/min/mmHg - - - 17.59  DLCO UNC% % - - - 69  DLCO corrected ml/min/mmHg - - - 18.36  DLCO COR %Predicted % - - -  81  DLVA Predicted % - - - 97  TLC L - - - 5.97  TLC % Predicted % - - - 115  RV % Predicted % - - - 143       has a past medical history of Anemia, Angio-edema, Asthma, severe persistent, Cancer (HCC), Chronic kidney disease, stage II (mild), DDD (degenerative disc disease), cervical, Diverticulitis, Diverticulosis (yrs ago), Edema, lower extremity, GERD (gastroesophageal reflux disease), Headache, Heart murmur, History of breast cancer (1990 left mastectomy also), History of DVT of lower extremity (5 yrs ago), History of shingles, Hyperlipidemia, Internal hemorrhoid, Leg ulcer, left (HCC), Osteoporosis, unspecified, Perennial allergic rhinitis, Peripheral vascular disease (HCC), Pneumonia, Restless legs syndrome (RLS), Rheumatoid arthritis ([place]), Thyroid nodule, Unspecified essential hypertension, and Urticaria.   reports that she has never smoked. She has never used smokeless tobacco.  Past Surgical History:  Procedure Laterality Date   CARDIAC CATHETERIZATION  08/ 01/ 2008   dr Cathie Olden   normal -- EF of 65%    CARDIOVASCULAR STRESS TEST  05-22-2011   dr Cathie Olden   normal lexiscan perfusion study/  no ischemia/  lvsf  86%   CATARACT EXTRACTION W/ INTRAOCULAR LENS  IMPLANT, BILATERAL     CHOLECYSTECTOMY  1993   laparoscopic   CONVERSION TO TOTAL HIP Left 12/03/2017   Procedure: CONVERSION TO LEFT TOTAL HIP;  Surgeon: Paralee Cancel, MD;  Location: [facility];  Service: Orthopedics;  Laterality: Left;  90 mins   HIP ARTHROPLASTY Left 12/22/2016   Procedure: HEMIARTHROPLASTY, LEFT;  Surgeon: Paralee Cancel, MD;  Location: [facility];  Service: Orthopedics;  Laterality: Left;   INCISION AND DRAINAGE OF WOUND Left 10/24/2013   Procedure: IRRIGATION AND DEBRIDEMENT OF LEFT LEG WITH PLACEMENT OF ACELL AND WOUND [place];  Surgeon: Theodoro Kos, DO;  Location: [facility];  Service: Plastics;  Laterality: Left;   KNEE ARTHROPLASTY     LUMBAR [place] Bilateral rigth 1989///   left  1990   breast cancer 1989//   fibrocytic disease 1990   ORIF HUMERUS FRACTURE Right 09/21/2018   Procedure: OPEN REDUCTION INTERNAL FIXATION (ORIF) PROXIMAL HUMERUS FRACTURE;  Surgeon: Nicholes Stairs, MD;  Location: [facility];  Service: Orthopedics;  Laterality: Right;  90 mins   TOTAL ABDOMINAL HYSTERECTOMY W/ BILATERAL SALPINGOOPHORECTOMY  1989   done at same time as right mastectomy   TOTAL KNEE ARTHROPLASTY Right 10/05/2012   Procedure: RIGHT TOTAL KNEE ARTHROPLASTY;  Surgeon: Mauri Pole, MD;  Location: [facility];  Service: Orthopedics;  Laterality: Right;    Allergies  Allergen Reactions   Egg Phospholipids    Eggs Or Egg-Derived Products     UNSPECIFIED REACTION  RAW EGGS.. Can eat cooked eggs   Influenza Vaccines     UNSPECIFIED REACTION  Egg allergy    Nucala [Mepolizumab]     UNSPECIFIED REACTION    Cephalosporins Rash   Codeine Rash   Gabapentin Nausea Only   Levofloxacin Rash   Pneumococcal Vaccines Rash    Immunization History  Administered Date(s) Administered    Janssen (J&J) SARS-COV-2 Vaccination 04/19/2020   Pneumococcal Conjugate-13 09/14/2012   Pneumococcal Polysaccharide-23 01/31/2009   Tdap 09/07/2018   Zoster Recombinat (Shingrix) 04/18/2017, 09/16/2017    Family History  Problem Relation Age of Onset   Heart attack Mother    Asthma Mother    Heart disease Mother    Hyperlipidemia Mother    Allergic rhinitis Mother    Heart attack  Father    Heart failure Father    Deep vein thrombosis Father    Heart disease Father    Peripheral vascular disease Father        amputation   Asthma Brother    Allergic rhinitis Brother    Asthma Sister    Eczema Sister    Allergic rhinitis Sister    Cancer Brother    Asthma Brother    Coronary artery disease Sister    Heart disease Sister    Asthma Maternal Grandmother    Urticaria Neg Hx      Current Outpatient Medications:    acetaminophen (TYLENOL) 500 MG tablet, Take 2 tablets (1,000 mg total) by mouth every 8 (eight) hours., Disp: 30 tablet, Rfl: 0   albuterol (PROAIR HFA) 108 (90 Base) MCG/ACT inhaler, Inhale 2 puffs into the lungs every 6 (six) hours as needed for wheezing or shortness of breath., Disp: 18 g, Rfl: 5   albuterol (PROVENTIL) (2.5 MG/3ML) 0.083% nebulizer solution, Take 3 mLs (2.5 mg total) by nebulization every 6 (six) hours as needed for wheezing or shortness of breath. Dx: J45.41, Disp: 360 mL, Rfl: 0   benzonatate (TESSALON) 100 MG capsule, Take 1 capsule (100 mg total) by mouth 3 (three) times daily as needed for cough., Disp: 100 capsule, Rfl: 1   budesonide-formoterol (SYMBICORT) 160-4.5 MCG/ACT inhaler, Inhale 2 puffs into the lungs 2 (two) times daily., Disp: , Rfl:    Calcium Carbonate-Vitamin D 600-400 MG-UNIT tablet, Take 1 tablet by mouth daily., Disp: , Rfl:    chlorpheniramine (CHLOR-TRIMETON) 4 MG tablet, Take 4-8 mg by mouth See admin instructions. 4 mg in the morning and 8 mg at bedtime, Disp: , Rfl:    Cholecalciferol (VITAMIN D3) 1000 UNITS tablet, Take  1,000 Units by mouth every evening. , Disp: , Rfl:    dextromethorphan (DELSYM) 30 MG/5ML liquid, Take 60 mg by mouth 2 (two) times daily as needed for cough., Disp: , Rfl:    dextromethorphan-guaiFENesin (MUCINEX DM) 30-600 MG 12hr tablet, Take 1 tablet by mouth daily as needed for cough., Disp: , Rfl:    Dupilumab (DUPIXENT) 300 MG/2ML SOPN, Inject 300 mg into the skin every 14 (fourteen) days. Deliver to patient's home for self-administration, Disp: 12 mL, Rfl: 1   fexofenadine (ALLEGRA) 180 MG tablet, Take 180 mg by mouth daily.  , Disp: , Rfl:    furosemide (LASIX) 20 MG tablet, TAKE 1 TABLET (20 MG TOTAL) BY MOUTH DAILY AS NEEDED FOR FLUID OR EDEMA., Disp: 90 tablet, Rfl: 3   ibandronate (BONIVA) 150 MG tablet, Take 1 tablet by mouth every 30 (thirty) days. , Disp: , Rfl: 3   ipratropium (ATROVENT) 0.06 % nasal spray, Place 2 sprays into both nostrils 2 (two) times daily., Disp: , Rfl:    mometasone (NASONEX) 50 MCG/ACT nasal spray, USE 2 SPRAYS NASALLY 2 TIMES DAILY, Disp: 17 g, Rfl: 11   montelukast (SINGULAIR) 10 MG tablet, TAKE 1 TABLET BY MOUTH EVERY NIGHT AT BEDTIME, Disp: 90 tablet, Rfl: 2   Multiple Vitamin (MULTIVITAMIN WITH MINERALS) TABS tablet, Take 1 tablet by mouth daily. Centrum Silver Multivitamin, Disp: , Rfl:    Multiple Vitamins-Minerals (PRESERVISION AREDS PO), Take 1 tablet by mouth 2 (two) times daily., Disp: , Rfl:    mupirocin ointment (BACTROBAN) 2 %, as needed. , Disp: , Rfl:    NEXIUM 40 MG capsule, TAKE 1 CAPSULE BY MOUTH TWICE A DAY, Disp: 180 capsule, Rfl: 1   nitroGLYCERIN (NITROSTAT) 0.4  MG SL tablet, Place 1 tablet (0.4 mg total) under the tongue every 5 (five) minutes as needed for chest pain., Disp: 25 tablet, Rfl: 3   potassium chloride (KLOR-CON) 10 MEQ tablet, TAKE 1 TABLET BY MOUTH EVERY DAY, Disp: 90 tablet, Rfl: 3   pramipexole (MIRAPEX) 0.125 MG tablet, Take 0.375 mg by mouth at bedtime., Disp: , Rfl:    rivaroxaban (XARELTO) 10 MG TABS tablet, Take 10  mg by mouth daily., Disp: , Rfl:    rosuvastatin (CRESTOR) 5 MG tablet, TAKE 1 TABLET BY MOUTH EVERYDAY AT BEDTIME, Disp: 90 tablet, Rfl: 3   Spacer/Aero-Holding Chambers (AEROCHAMBER MV) inhaler, by Other route. Use as instructed , Disp: , Rfl:    telmisartan (MICARDIS) 20 MG tablet, Take 20 mg by mouth daily. , Disp: , Rfl:    vitamin B-12 (CYANOCOBALAMIN) 500 MCG tablet, Take 500 mcg by mouth daily.  , Disp: , Rfl:       Objective:   Vitals:   12/26/20 1405  BP: 124/72  Pulse: 85  Temp: 98 F (36.7 C)  TempSrc: Oral  SpO2: 98%  Weight: 143 lb 9.6 oz (65.1 kg)  Height: '5\' 5"'$  (1.651 m)    Estimated body mass index is 23.9 kg/m as calculated from the following:   Height as of this encounter: '5\' 5"'$  (1.651 m).   Weight as of this encounter: 143 lb 9.6 oz (65.1 kg).  '@WEIGHTCHANGE'$ @  [employer]   12/26/20 1405  Weight: 143 lb 9.6 oz (65.1 kg)     Physical Exam General: No distress. Look swell Neuro: Alert and Oriented x 3. GCS 15. Speech normal Psych: Pleasant Resp:  Barrel Chest - no.  Wheeze - no, Crackles - no, No overt respiratory distress CVS: Normal heart sounds. Murmurs - no Ext: Stigmata of Connective Tissue Disease - no HEENT: Normal upper airway. PEERL +.  post nasal drip ++ - baseline        Assessment:       ICD-10-CM   1. Severe persistent asthma without complication  123XX123     2. Post-nasal drip  R09.82     3. Advice given about COVID-19 virus infection  Z71.89     4. Vaccine counseling  Z71.85          Plan:     Patient Instructions     ICD-10-CM   1. Severe persistent asthma without complication  123XX123     2. Post-nasal drip  R09.82     3. Advice given about COVID-19 virus infection  Z71.89     4. Vaccine counseling  Z71.85       Severe persistent asthma without complication Post-nasal drip  -Clinically asthma is under good control except for baseline postnasal drip and associated cough because of that -Per historical  review ENT is managing  sinus drainage  Plan - Continue Symbicort scheduled, Dupixent schedule and Singulair scheduled -Continue nasal steroid, Tessalon cough pills, Delsym, Mucinex and Chlor-Trimeton  Advice given about COVID-19 virus infection Vaccine counseling   -Understand that he got the [place] in December 2021 and it caused side effects. - Respect the fact that you have significant allergic reaction concerns with mRNA vaccine against COVID  Plan - Recommend COVID IgG blood test - Refer for  EVUSHELD  monoclonal antibody prophylaxis   Follow-up - 6 months or sooner if needed    SIGNATURE    Dr. Brand Males, M.D., F.C.C.P,  Pulmonary and Critical Care Medicine Staff Physician, [facility] Director - Interstitial Lung Disease  Program  P[facility] [place] [facility] [place], [place], [address]  Pager: [phone], If no answer or between  15:00h - 7:00h: call [phone] Telephone: [phone]  2:49 PM 12/26/2020

## 2020-12-26 NOTE — Telephone Encounter (Signed)
Called to schedule evusheld. No answer. Left a message on voicemail to call back. Thanks!

## 2020-12-26 NOTE — Addendum Note (Signed)
Addended by: Lorretta Harp on: 12/26/2020 03:01 PM   Modules accepted: Orders

## 2020-12-26 NOTE — Patient Instructions (Addendum)
ICD-10-CM   1. Severe persistent asthma without complication  123XX123     2. Post-nasal drip  R09.82     3. Advice given about COVID-19 virus infection  Z71.89     4. Vaccine counseling  Z71.85       Severe persistent asthma without complication Post-nasal drip  -Clinically asthma is under good control except for baseline postnasal drip and associated cough because of that -Per historical review ENT is managing  sinus drainage  Plan - Continue Symbicort scheduled, Dupixent schedule and Singulair scheduled -Continue nasal steroid, Tessalon cough pills, Delsym, Mucinex and Chlor-Trimeton  Advice given about COVID-19 virus infection Vaccine counseling   -Understand that he got the [place] in December 2021 and it caused side effects. - Respect the fact that you have significant allergic reaction concerns with mRNA vaccine against COVID  Plan - Recommend COVID IgG blood test - Refer for  EVUSHELD  monoclonal antibody prophylaxis   Follow-up - 6 months or sooner if needed

## 2020-12-27 ENCOUNTER — Other Ambulatory Visit: Payer: Medicare Other

## 2020-12-27 ENCOUNTER — Other Ambulatory Visit: Payer: Self-pay | Admitting: *Deleted

## 2020-12-27 ENCOUNTER — Ambulatory Visit (INDEPENDENT_AMBULATORY_CARE_PROVIDER_SITE_OTHER): Payer: Medicare Other

## 2020-12-27 DIAGNOSIS — Z7189 Other specified counseling: Secondary | ICD-10-CM | POA: Diagnosis not present

## 2020-12-27 DIAGNOSIS — Z7185 Encounter for immunization safety counseling: Secondary | ICD-10-CM | POA: Diagnosis not present

## 2020-12-27 DIAGNOSIS — J4550 Severe persistent asthma, uncomplicated: Secondary | ICD-10-CM

## 2020-12-27 DIAGNOSIS — Z298 Encounter for other specified prophylactic measures: Secondary | ICD-10-CM

## 2020-12-27 MED ORDER — TIXAGEVIMAB (PART OF EVUSHELD) INJECTION
300.0000 mg | Freq: Once | INTRAMUSCULAR | Status: AC
  Administered 2020-12-27: 300 mg via INTRAMUSCULAR

## 2020-12-27 MED ORDER — EPINEPHRINE 0.3 MG/0.3ML IJ SOAJ: 0.3000 mg | Freq: Once | INTRAMUSCULAR | Status: AC | PRN

## 2020-12-27 MED ORDER — ALBUTEROL SULFATE HFA 108 (90 BASE) MCG/ACT IN AERS: 2.0000 | INHALATION_SPRAY | Freq: Once | RESPIRATORY_TRACT | Status: AC | PRN

## 2020-12-27 MED ORDER — CILGAVIMAB (PART OF EVUSHELD) INJECTION
300.0000 mg | Freq: Once | INTRAMUSCULAR | Status: AC
  Administered 2020-12-27: 300 mg via INTRAMUSCULAR

## 2020-12-27 MED ORDER — METHYLPREDNISOLONE SODIUM SUCC 125 MG IJ SOLR: 125.0000 mg | Freq: Once | INTRAMUSCULAR | Status: DC | PRN

## 2020-12-27 MED ORDER — DIPHENHYDRAMINE HCL 50 MG/ML IJ SOLN: 50.0000 mg | Freq: Once | INTRAMUSCULAR | Status: AC | PRN

## 2020-12-27 NOTE — Progress Notes (Signed)
Diagnosis: Pre-COVID Exposure Prophylaxis  Provider:  Marshell Garfinkel, MD  Procedure: Injection  Evusheld, Dose: '600mg'$  , Site: intramuscular  Discharge: Condition: Good, Destination: Home . AVS provided to patient.   Performed by:  Paul Dykes, RN

## 2020-12-28 LAB — SARS-COV-2 ANTIBODY(IGG)SPIKE,SEMI-QUANTITATIVE: SARS COV1 AB(IGG)SPIKE,SEMI QN: 1.31 index — ABNORMAL HIGH (ref <1.00)

## 2021-01-02 ENCOUNTER — Ambulatory Visit: Payer: Medicare Other

## 2021-01-11 DIAGNOSIS — Z96651 Presence of right artificial knee joint: Secondary | ICD-10-CM | POA: Diagnosis not present

## 2021-01-11 DIAGNOSIS — M25561 Pain in right knee: Secondary | ICD-10-CM | POA: Diagnosis not present

## 2021-01-16 ENCOUNTER — Ambulatory Visit: Payer: Medicare Other

## 2021-01-30 ENCOUNTER — Other Ambulatory Visit: Payer: Self-pay | Admitting: Internal Medicine

## 2021-01-30 ENCOUNTER — Ambulatory Visit: Payer: Medicare Other

## 2021-01-30 DIAGNOSIS — J4550 Severe persistent asthma, uncomplicated: Secondary | ICD-10-CM

## 2021-01-31 NOTE — Telephone Encounter (Signed)
Refill for Dupixent 300 mg every 14 days sent to [facility].  Patient was last seen by Dr. Chase Caller on 12/26/20 with plan to continue [place] as prescribed in combination with Symbicort and montelukast  Knox Saliva, PharmD, MPH, BCPS Clinical Pharmacist (Rheumatology and Pulmonology)

## 2021-02-13 ENCOUNTER — Ambulatory Visit: Payer: Medicare Other

## 2021-02-27 ENCOUNTER — Ambulatory Visit: Payer: Medicare Other

## 2021-03-13 ENCOUNTER — Ambulatory Visit: Payer: Medicare Other

## 2021-03-27 ENCOUNTER — Ambulatory Visit: Payer: Medicare Other

## 2021-03-31 ENCOUNTER — Other Ambulatory Visit: Payer: Self-pay

## 2021-03-31 ENCOUNTER — Inpatient Hospital Stay (HOSPITAL_COMMUNITY)
Admission: EM | Admit: 2021-03-31 | Discharge: 2021-04-08 | DRG: 522 | Disposition: A | Discharge status: Home Health | Payer: Medicare Other | Attending: Internal Medicine | Admitting: Internal Medicine

## 2021-03-31 ENCOUNTER — Emergency Department (HOSPITAL_COMMUNITY): Payer: Medicare Other

## 2021-03-31 ENCOUNTER — Encounter (HOSPITAL_COMMUNITY): Payer: Self-pay | Admitting: *Deleted

## 2021-03-31 DIAGNOSIS — H539 Unspecified visual disturbance: Secondary | ICD-10-CM | POA: Diagnosis present

## 2021-03-31 DIAGNOSIS — Z887 Allergy status to serum and vaccine status: Secondary | ICD-10-CM

## 2021-03-31 DIAGNOSIS — N1832 Chronic kidney disease, stage 3b: Secondary | ICD-10-CM | POA: Diagnosis present

## 2021-03-31 DIAGNOSIS — M069 Rheumatoid arthritis, unspecified: Secondary | ICD-10-CM | POA: Diagnosis not present

## 2021-03-31 DIAGNOSIS — Z8619 Personal history of other infectious and parasitic diseases: Secondary | ICD-10-CM

## 2021-03-31 DIAGNOSIS — S40011A Contusion of right shoulder, initial encounter: Secondary | ICD-10-CM | POA: Diagnosis present

## 2021-03-31 DIAGNOSIS — Z888 Allergy status to other drugs, medicaments and biological substances status: Secondary | ICD-10-CM

## 2021-03-31 DIAGNOSIS — M160 Bilateral primary osteoarthritis of hip: Secondary | ICD-10-CM | POA: Diagnosis not present

## 2021-03-31 DIAGNOSIS — Z471 Aftercare following joint replacement surgery: Secondary | ICD-10-CM | POA: Diagnosis not present

## 2021-03-31 DIAGNOSIS — S0003XA Contusion of scalp, initial encounter: Secondary | ICD-10-CM | POA: Diagnosis not present

## 2021-03-31 DIAGNOSIS — S72041A Displaced fracture of base of neck of right femur, initial encounter for closed fracture: Secondary | ICD-10-CM

## 2021-03-31 DIAGNOSIS — R0902 Hypoxemia: Secondary | ICD-10-CM | POA: Diagnosis not present

## 2021-03-31 DIAGNOSIS — I130 Hypertensive heart and chronic kidney disease with heart failure and stage 1 through stage 4 chronic kidney disease, or unspecified chronic kidney disease: Secondary | ICD-10-CM | POA: Diagnosis not present

## 2021-03-31 DIAGNOSIS — Z83438 Family history of other disorder of lipoprotein metabolism and other lipidemia: Secondary | ICD-10-CM | POA: Diagnosis not present

## 2021-03-31 DIAGNOSIS — M25519 Pain in unspecified shoulder: Secondary | ICD-10-CM | POA: Diagnosis not present

## 2021-03-31 DIAGNOSIS — S199XXA Unspecified injury of neck, initial encounter: Secondary | ICD-10-CM | POA: Diagnosis not present

## 2021-03-31 DIAGNOSIS — Z9841 Cataract extraction status, right eye: Secondary | ICD-10-CM

## 2021-03-31 DIAGNOSIS — S72001A Fracture of unspecified part of neck of right femur, initial encounter for closed fracture: Secondary | ICD-10-CM | POA: Diagnosis not present

## 2021-03-31 DIAGNOSIS — Z8701 Personal history of pneumonia (recurrent): Secondary | ICD-10-CM | POA: Diagnosis not present

## 2021-03-31 DIAGNOSIS — J4550 Severe persistent asthma, uncomplicated: Secondary | ICD-10-CM | POA: Diagnosis not present

## 2021-03-31 DIAGNOSIS — M170 Bilateral primary osteoarthritis of knee: Secondary | ICD-10-CM | POA: Diagnosis present

## 2021-03-31 DIAGNOSIS — S72044A Nondisplaced fracture of base of neck of right femur, initial encounter for closed fracture: Secondary | ICD-10-CM | POA: Diagnosis not present

## 2021-03-31 DIAGNOSIS — Z96649 Presence of unspecified artificial hip joint: Secondary | ICD-10-CM

## 2021-03-31 DIAGNOSIS — M25551 Pain in right hip: Secondary | ICD-10-CM | POA: Diagnosis not present

## 2021-03-31 DIAGNOSIS — Z86718 Personal history of other venous thrombosis and embolism: Secondary | ICD-10-CM | POA: Diagnosis not present

## 2021-03-31 DIAGNOSIS — G2581 Restless legs syndrome: Secondary | ICD-10-CM | POA: Diagnosis not present

## 2021-03-31 DIAGNOSIS — Z853 Personal history of malignant neoplasm of breast: Secondary | ICD-10-CM | POA: Diagnosis not present

## 2021-03-31 DIAGNOSIS — Z96651 Presence of right artificial knee joint: Secondary | ICD-10-CM | POA: Diagnosis present

## 2021-03-31 DIAGNOSIS — S7291XA Unspecified fracture of right femur, initial encounter for closed fracture: Secondary | ICD-10-CM | POA: Diagnosis present

## 2021-03-31 DIAGNOSIS — Z96641 Presence of right artificial hip joint: Secondary | ICD-10-CM | POA: Diagnosis not present

## 2021-03-31 DIAGNOSIS — Z9842 Cataract extraction status, left eye: Secondary | ICD-10-CM

## 2021-03-31 DIAGNOSIS — N183 Chronic kidney disease, stage 3 unspecified: Secondary | ICD-10-CM | POA: Diagnosis not present

## 2021-03-31 DIAGNOSIS — Y9222 Religious institution as the place of occurrence of the external cause: Secondary | ICD-10-CM | POA: Diagnosis not present

## 2021-03-31 DIAGNOSIS — K219 Gastro-esophageal reflux disease without esophagitis: Secondary | ICD-10-CM | POA: Diagnosis present

## 2021-03-31 DIAGNOSIS — Z881 Allergy status to other antibiotic agents status: Secondary | ICD-10-CM

## 2021-03-31 DIAGNOSIS — Z20822 Contact with and (suspected) exposure to covid-19: Secondary | ICD-10-CM | POA: Diagnosis not present

## 2021-03-31 DIAGNOSIS — I2510 Atherosclerotic heart disease of native coronary artery without angina pectoris: Secondary | ICD-10-CM | POA: Diagnosis not present

## 2021-03-31 DIAGNOSIS — R42 Dizziness and giddiness: Secondary | ICD-10-CM | POA: Diagnosis not present

## 2021-03-31 DIAGNOSIS — Z96642 Presence of left artificial hip joint: Secondary | ICD-10-CM | POA: Diagnosis present

## 2021-03-31 DIAGNOSIS — Z90710 Acquired absence of both cervix and uterus: Secondary | ICD-10-CM

## 2021-03-31 DIAGNOSIS — Z79899 Other long term (current) drug therapy: Secondary | ICD-10-CM

## 2021-03-31 DIAGNOSIS — Z9049 Acquired absence of other specified parts of digestive tract: Secondary | ICD-10-CM

## 2021-03-31 DIAGNOSIS — W108XXA Fall (on) (from) other stairs and steps, initial encounter: Secondary | ICD-10-CM | POA: Diagnosis present

## 2021-03-31 DIAGNOSIS — M25511 Pain in right shoulder: Secondary | ICD-10-CM | POA: Diagnosis not present

## 2021-03-31 DIAGNOSIS — I739 Peripheral vascular disease, unspecified: Secondary | ICD-10-CM | POA: Diagnosis not present

## 2021-03-31 DIAGNOSIS — I5032 Chronic diastolic (congestive) heart failure: Secondary | ICD-10-CM | POA: Diagnosis present

## 2021-03-31 DIAGNOSIS — R011 Cardiac murmur, unspecified: Secondary | ICD-10-CM | POA: Diagnosis present

## 2021-03-31 DIAGNOSIS — Z885 Allergy status to narcotic agent status: Secondary | ICD-10-CM

## 2021-03-31 DIAGNOSIS — E041 Nontoxic single thyroid nodule: Secondary | ICD-10-CM | POA: Diagnosis present

## 2021-03-31 DIAGNOSIS — Z91012 Allergy to eggs: Secondary | ICD-10-CM

## 2021-03-31 DIAGNOSIS — D509 Iron deficiency anemia, unspecified: Secondary | ICD-10-CM | POA: Diagnosis present

## 2021-03-31 DIAGNOSIS — Z825 Family history of asthma and other chronic lower respiratory diseases: Secondary | ICD-10-CM | POA: Diagnosis not present

## 2021-03-31 DIAGNOSIS — Z7901 Long term (current) use of anticoagulants: Secondary | ICD-10-CM

## 2021-03-31 DIAGNOSIS — S72011A Unspecified intracapsular fracture of right femur, initial encounter for closed fracture: Principal | ICD-10-CM | POA: Diagnosis present

## 2021-03-31 DIAGNOSIS — M810 Age-related osteoporosis without current pathological fracture: Secondary | ICD-10-CM | POA: Diagnosis not present

## 2021-03-31 DIAGNOSIS — I10 Essential (primary) hypertension: Secondary | ICD-10-CM | POA: Diagnosis not present

## 2021-03-31 DIAGNOSIS — G4489 Other headache syndrome: Secondary | ICD-10-CM | POA: Diagnosis not present

## 2021-03-31 DIAGNOSIS — Z961 Presence of intraocular lens: Secondary | ICD-10-CM | POA: Diagnosis present

## 2021-03-31 DIAGNOSIS — W19XXXA Unspecified fall, initial encounter: Secondary | ICD-10-CM

## 2021-03-31 DIAGNOSIS — W109XXA Fall (on) (from) unspecified stairs and steps, initial encounter: Secondary | ICD-10-CM | POA: Diagnosis not present

## 2021-03-31 DIAGNOSIS — E785 Hyperlipidemia, unspecified: Secondary | ICD-10-CM | POA: Diagnosis not present

## 2021-03-31 DIAGNOSIS — T1490XA Injury, unspecified, initial encounter: Secondary | ICD-10-CM | POA: Diagnosis not present

## 2021-03-31 DIAGNOSIS — Z90722 Acquired absence of ovaries, bilateral: Secondary | ICD-10-CM

## 2021-03-31 DIAGNOSIS — Z9013 Acquired absence of bilateral breasts and nipples: Secondary | ICD-10-CM

## 2021-03-31 DIAGNOSIS — Z7951 Long term (current) use of inhaled steroids: Secondary | ICD-10-CM

## 2021-03-31 DIAGNOSIS — Z8249 Family history of ischemic heart disease and other diseases of the circulatory system: Secondary | ICD-10-CM | POA: Diagnosis not present

## 2021-03-31 DIAGNOSIS — G319 Degenerative disease of nervous system, unspecified: Secondary | ICD-10-CM | POA: Diagnosis not present

## 2021-03-31 LAB — BASIC METABOLIC PANEL
Anion gap: 10 (ref 5–15)
BUN: 20 mg/dL (ref 8–23)
CO2: 28 mmol/L (ref 22–32)
Calcium: 8.8 mg/dL — ABNORMAL LOW (ref 8.9–10.3)
Chloride: 98 mmol/L (ref 98–111)
Creatinine, Ser: 1.49 mg/dL — ABNORMAL HIGH (ref 0.44–1.00)
GFR, Estimated: 34 mL/min — ABNORMAL LOW (ref >60)
Glucose, Bld: 164 mg/dL — ABNORMAL HIGH (ref 70–99)
Potassium: 3.8 mmol/L (ref 3.5–5.1)
Sodium: 136 mmol/L (ref 135–145)

## 2021-03-31 LAB — CBC WITH DIFFERENTIAL/PLATELET
Abs Immature Granulocytes: 0.08 10*3/uL — ABNORMAL HIGH (ref 0.00–0.07)
Basophils Absolute: 0 10*3/uL (ref 0.0–0.1)
Basophils Relative: 0 %
Eosinophils Absolute: 0.1 10*3/uL (ref 0.0–0.5)
Eosinophils Relative: 1 %
HCT: 37.8 % (ref 36.0–46.0)
Hemoglobin: 12 g/dL (ref 12.0–15.0)
Immature Granulocytes: 1 %
Lymphocytes Relative: 14 %
Lymphs Abs: 1.3 10*3/uL (ref 0.7–4.0)
MCH: 29.5 pg (ref 26.0–34.0)
MCHC: 31.7 g/dL (ref 30.0–36.0)
MCV: 92.9 fL (ref 80.0–100.0)
Monocytes Absolute: 0.5 10*3/uL (ref 0.1–1.0)
Monocytes Relative: 6 %
Neutro Abs: 7.2 10*3/uL (ref 1.7–7.7)
Neutrophils Relative %: 78 %
Platelets: 248 10*3/uL (ref 150–400)
RBC: 4.07 MIL/uL (ref 3.87–5.11)
RDW: 12.8 % (ref 11.5–15.5)
WBC: 9.2 10*3/uL (ref 4.0–10.5)
nRBC: 0 % (ref 0.0–0.2)

## 2021-03-31 LAB — RESP PANEL BY RT-PCR (FLU A&B, COVID) ARPGX2
Influenza A by PCR: NEGATIVE
Influenza B by PCR: NEGATIVE
SARS Coronavirus 2 by RT PCR: NEGATIVE

## 2021-03-31 MED ORDER — ROSUVASTATIN CALCIUM 5 MG PO TABS
5.0000 mg | ORAL_TABLET | Freq: Every day | ORAL | Status: DC
  Administered 2021-03-31 – 2021-04-07 (×8): 5 mg via ORAL
  Filled 2021-03-31 (×9): qty 1

## 2021-03-31 MED ORDER — FENTANYL CITRATE PF 50 MCG/ML IJ SOSY
100.0000 ug | PREFILLED_SYRINGE | Freq: Once | INTRAMUSCULAR | Status: AC
  Administered 2021-03-31: 100 ug via INTRAVENOUS
  Filled 2021-03-31: qty 2

## 2021-03-31 MED ORDER — ONDANSETRON HCL 4 MG PO TABS: 4.0000 mg | ORAL_TABLET | Freq: Four times a day (QID) | ORAL | Status: DC | PRN

## 2021-03-31 MED ORDER — ONDANSETRON HCL 4 MG/2ML IJ SOLN
4.0000 mg | Freq: Four times a day (QID) | INTRAMUSCULAR | Status: DC | PRN
  Administered 2021-03-31 – 2021-04-01 (×4): 4 mg via INTRAVENOUS
  Filled 2021-03-31 (×4): qty 2

## 2021-03-31 MED ORDER — TRAMADOL HCL 50 MG PO TABS
50.0000 mg | ORAL_TABLET | Freq: Four times a day (QID) | ORAL | Status: AC | PRN
  Administered 2021-03-31 – 2021-04-01 (×2): 50 mg via ORAL
  Filled 2021-03-31 (×2): qty 1

## 2021-03-31 MED ORDER — FLUTICASONE FUROATE-VILANTEROL 200-25 MCG/ACT IN AEPB
1.0000 | INHALATION_SPRAY | Freq: Every day | RESPIRATORY_TRACT | Status: DC
  Administered 2021-04-01 – 2021-04-08 (×7): 1 via RESPIRATORY_TRACT
  Filled 2021-03-31: qty 28

## 2021-03-31 MED ORDER — POLYETHYLENE GLYCOL 3350 17 G PO PACK: 17.0000 g | PACK | Freq: Every day | ORAL | Status: DC | PRN

## 2021-03-31 MED ORDER — FLUTICASONE PROPIONATE 50 MCG/ACT NA SUSP
1.0000 | Freq: Every day | NASAL | Status: DC
  Administered 2021-04-01 – 2021-04-08 (×8): 1 via NASAL
  Filled 2021-03-31: qty 16

## 2021-03-31 MED ORDER — FENTANYL CITRATE PF 50 MCG/ML IJ SOSY
25.0000 ug | PREFILLED_SYRINGE | Freq: Once | INTRAMUSCULAR | Status: AC
  Administered 2021-03-31: 25 ug via INTRAVENOUS
  Filled 2021-03-31: qty 1

## 2021-03-31 MED ORDER — PRAMIPEXOLE DIHYDROCHLORIDE 0.25 MG PO TABS
0.3750 mg | ORAL_TABLET | Freq: Every day | ORAL | Status: DC
  Administered 2021-03-31 – 2021-04-07 (×8): 0.375 mg via ORAL
  Filled 2021-03-31 (×6): qty 1.5
  Filled 2021-03-31: qty 3
  Filled 2021-03-31: qty 1.5

## 2021-03-31 MED ORDER — FENTANYL CITRATE PF 50 MCG/ML IJ SOSY
50.0000 ug | PREFILLED_SYRINGE | Freq: Once | INTRAMUSCULAR | Status: AC
  Administered 2021-03-31: 50 ug via INTRAVENOUS
  Filled 2021-03-31: qty 1

## 2021-03-31 MED ORDER — SENNA 8.6 MG PO TABS
1.0000 | ORAL_TABLET | Freq: Two times a day (BID) | ORAL | Status: DC
  Administered 2021-03-31 – 2021-04-05 (×7): 8.6 mg via ORAL
  Filled 2021-03-31 (×10): qty 1

## 2021-03-31 MED ORDER — LORATADINE 10 MG PO TABS
10.0000 mg | ORAL_TABLET | Freq: Every day | ORAL | Status: DC
  Administered 2021-04-01 – 2021-04-08 (×7): 10 mg via ORAL
  Filled 2021-03-31 (×7): qty 1

## 2021-03-31 MED ORDER — ONDANSETRON HCL 4 MG/2ML IJ SOLN
4.0000 mg | Freq: Once | INTRAMUSCULAR | Status: AC
  Administered 2021-03-31: 4 mg via INTRAVENOUS
  Filled 2021-03-31: qty 2

## 2021-03-31 MED ORDER — MONTELUKAST SODIUM 10 MG PO TABS
10.0000 mg | ORAL_TABLET | Freq: Every day | ORAL | Status: DC
  Administered 2021-03-31 – 2021-04-07 (×8): 10 mg via ORAL
  Filled 2021-03-31 (×9): qty 1

## 2021-03-31 MED ORDER — IRBESARTAN 75 MG PO TABS
75.0000 mg | ORAL_TABLET | Freq: Every day | ORAL | Status: DC
  Administered 2021-04-01 – 2021-04-08 (×7): 75 mg via ORAL
  Filled 2021-03-31 (×7): qty 1

## 2021-03-31 MED ORDER — OXYCODONE HCL 5 MG PO TABS: 5.0000 mg | ORAL_TABLET | ORAL | Status: DC | PRN

## 2021-03-31 MED ORDER — PANTOPRAZOLE SODIUM 40 MG PO TBEC
40.0000 mg | DELAYED_RELEASE_TABLET | Freq: Every day | ORAL | Status: DC
  Administered 2021-03-31 – 2021-04-08 (×8): 40 mg via ORAL
  Filled 2021-03-31 (×8): qty 1

## 2021-03-31 MED ORDER — ALBUTEROL SULFATE (2.5 MG/3ML) 0.083% IN NEBU: 3.0000 mL | INHALATION_SOLUTION | Freq: Four times a day (QID) | RESPIRATORY_TRACT | Status: DC | PRN

## 2021-03-31 NOTE — ED Notes (Signed)
Pt O2 sat at 91%, placed pt on 2L [place]; O2 sat came up to 98%

## 2021-03-31 NOTE — ED Notes (Signed)
Patient transported to CT 

## 2021-03-31 NOTE — H&P (Signed)
History and Physical  [name] PPI:[id number] DOB: November 15, 1932 DOA: 03/31/2021  Referring physician: Theodis Blaze, PA-C, EDP PCP: Crist Infante, MD  Outpatient Specialists:  [facility] - Pulmonology  Patient Coming From: Home  Chief Complaint: fall, right hip pain  HPI: [name] is a 87 y.o. female with a history of DVT on chronic anticoagulation, hyperlipidemia, hypertension, history of breast cancer, stage II chronic kidney disease, asthma, rheumatoid arthritis.  Patient seen after a fall at church.  Her foot got caught on a step and she fell onto her right side and had immediate pain.  She has been unable to bear weight on that side.  Pain worse with movement and improved with rest.  She was brought here by EMS.  Emergency Department Course: Hip x-ray shows right femoral neck fracture.  Head CT unremarkable.  Labs relatively normal.  Review of Systems:   Pt denies any fevers, chills, nausea, vomiting, diarrhea, constipation, abdominal pain, shortness of breath, dyspnea on exertion, orthopnea, cough, wheezing, palpitations, headache, vision changes, lightheadedness, dizziness, melena, rectal bleeding.  Review of systems are otherwise negative  Past Medical History:  Diagnosis Date   Anemia    after hysterectomy   Angio-edema    Asthma, severe persistent    pulmologist-- dr Lynford Citizen   [facility])    Breast cancer on right   Chronic kidney disease, stage II (mild)    DDD (degenerative disc disease), cervical    Diverticulitis    Diverticulosis yrs ago   hx of    Edema, lower extremity    occ both legs swell   GERD (gastroesophageal reflux disease)    Headache    sinus headaches   Heart murmur    no problems - per pt   History of breast cancer 1990 left mastectomy also   1989  S/P   RIGHT MASTECTOMY ;  NO CHEMORADIATION //   NO RECURRENCE   History of DVT of lower extremity 5 yrs ago   right leg   History of shingles    Hyperlipidemia     Internal hemorrhoid    Leg ulcer, left (HCC)    Osteoporosis, unspecified    Knee and hip osteoarthritis bilaterally   Perennial allergic rhinitis    Peripheral vascular disease (HCC)    Pneumonia    Restless legs syndrome (RLS)    Rheumatoid arthritis ([place])    hands   Thyroid nodule    followed by dr perrini yearly, no current problam   Unspecified essential hypertension    Urticaria    Past Surgical History:  Procedure Laterality Date   CARDIAC CATHETERIZATION  08/ 01/ 2008   dr Cathie Olden   normal -- EF of 65%   CARDIOVASCULAR STRESS TEST  05-22-2011   dr Cathie Olden   normal lexiscan perfusion study/  no ischemia/  lvsf  86%   CATARACT EXTRACTION W/ INTRAOCULAR LENS  IMPLANT, BILATERAL     CHOLECYSTECTOMY  1993   laparoscopic   CONVERSION TO TOTAL HIP Left 12/03/2017   Procedure: CONVERSION TO LEFT TOTAL HIP;  Surgeon: Paralee Cancel, MD;  Location: [facility];  Service: Orthopedics;  Laterality: Left;  90 mins   HIP ARTHROPLASTY Left 12/22/2016   Procedure: HEMIARTHROPLASTY, LEFT;  Surgeon: Paralee Cancel, MD;  Location: [facility];  Service: Orthopedics;  Laterality: Left;   INCISION AND DRAINAGE OF WOUND Left 10/24/2013   Procedure: IRRIGATION AND DEBRIDEMENT OF LEFT LEG WITH PLACEMENT OF ACELL AND WOUND [place];  Surgeon: Theodoro Kos, DO;  Location: [facility];  Service: Plastics;  Laterality: Left;   KNEE ARTHROPLASTY     LUMBAR [place] Bilateral rigth 1989///   left  1990   breast cancer 1989//   fibrocytic disease 1990   ORIF HUMERUS FRACTURE Right 09/21/2018   Procedure: OPEN REDUCTION INTERNAL FIXATION (ORIF) PROXIMAL HUMERUS FRACTURE;  Surgeon: Nicholes Stairs, MD;  Location: [place];  Service: Orthopedics;  Laterality: Right;  90 mins   TOTAL ABDOMINAL HYSTERECTOMY W/ BILATERAL SALPINGOOPHORECTOMY  1989   done at same time as right mastectomy   TOTAL KNEE ARTHROPLASTY Right 10/05/2012   Procedure: RIGHT TOTAL KNEE ARTHROPLASTY;   Surgeon: Mauri Pole, MD;  Location: [facility];  Service: Orthopedics;  Laterality: Right;   Social History:  reports that she has never smoked. She has never used smokeless tobacco. She reports that she does not drink alcohol and does not use drugs. Patient lives at home  Allergies  Allergen Reactions   Egg Phospholipids    Eggs Or Egg-Derived Products     UNSPECIFIED REACTION  RAW EGGS.. Can eat cooked eggs   Influenza Vaccines     UNSPECIFIED REACTION  Egg allergy    Nucala [Mepolizumab]     UNSPECIFIED REACTION    Cephalosporins Rash   Codeine Rash   Gabapentin Nausea Only   Levofloxacin Rash   Pneumococcal Vaccines Rash    Family History  Problem Relation Age of Onset   Heart attack Mother    Asthma Mother    Heart disease Mother    Hyperlipidemia Mother    Allergic rhinitis Mother    Heart attack Father    Heart failure Father    Deep vein thrombosis Father    Heart disease Father    Peripheral vascular disease Father        amputation   Asthma Brother    Allergic rhinitis Brother    Asthma Sister    Eczema Sister    Allergic rhinitis Sister    Cancer Brother    Asthma Brother    Coronary artery disease Sister    Heart disease Sister    Asthma Maternal Grandmother    Urticaria Neg Hx       Prior to Admission medications   Medication Sig Start Date End Date Taking? Authorizing Provider  acetaminophen (TYLENOL) 500 MG tablet Take 2 tablets (1,000 mg total) by mouth every 8 (eight) hours. 12/03/17   Danae Orleans, PA-C  albuterol (PROAIR HFA) 108 (90 Base) MCG/ACT inhaler Inhale 2 puffs into the lungs every 6 (six) hours as needed for wheezing or shortness of breath. 12/26/20   Brand Males, MD  albuterol (PROVENTIL) (2.5 MG/3ML) 0.083% nebulizer solution Take 3 mLs (2.5 mg total) by nebulization every 6 (six) hours as needed for wheezing or shortness of breath. Dx: J45.41 09/03/17   Brand Males, MD  benzonatate (TESSALON) 100 MG capsule Take 1  capsule (100 mg total) by mouth 3 (three) times daily as needed for cough. 12/02/17   Brand Males, MD  budesonide-formoterol (SYMBICORT) 160-4.5 MCG/ACT inhaler Inhale 2 puffs into the lungs 2 (two) times daily. 12/26/20   Brand Males, MD  budesonide-formoterol (SYMBICORT) 160-4.5 MCG/ACT inhaler Symbicort 160 mcg-4.5 mcg/actuation HFA aerosol inhaler    [provider]  Calcium Carbonate-Vitamin D 600-400 MG-UNIT tablet Take 1 tablet by mouth daily.    [provider]  chlorpheniramine (CHLOR-TRIMETON) 4 MG tablet Take 4-8 mg by mouth See admin instructions.  4 mg in the morning and 8 mg at bedtime 01/12/13   Elsie Stain, MD  Cholecalciferol (VITAMIN D3) 1000 UNITS tablet Take 1,000 Units by mouth every evening.     [provider]  dextromethorphan (DELSYM) 30 MG/5ML liquid Take 60 mg by mouth 2 (two) times daily as needed for cough.    [provider]  dextromethorphan-guaiFENesin (MUCINEX DM) 30-600 MG 12hr tablet Take 1 tablet by mouth daily as needed for cough.    [provider]  Dupilumab (DUPIXENT) 300 MG/2ML SOPN INJECT 1 PEN UNDER THE SKIN EVERY OTHER WEEK 01/31/21   Brand Males, MD  fexofenadine (ALLEGRA) 180 MG tablet Take 180 mg by mouth daily.   12/02/10   Elsie Stain, MD  FORTEO 600 MCG/2.4ML SOPN Inject into the skin. 03/18/21   [provider]  furosemide (LASIX) 20 MG tablet TAKE 1 TABLET (20 MG TOTAL) BY MOUTH DAILY AS NEEDED FOR FLUID OR EDEMA. 09/25/20   Nahser, Wonda Cheng, MD  ibandronate (BONIVA) 150 MG tablet Take 1 tablet by mouth every 30 (thirty) days.  03/31/18   [provider]  ipratropium (ATROVENT) 0.06 % nasal spray Place 2 sprays into both nostrils 2 (two) times daily. 01/24/19   [provider]  mometasone (NASONEX) 50 MCG/ACT nasal spray USE 2 SPRAYS NASALLY 2 TIMES DAILY 12/16/19   Brand Males, MD  montelukast (SINGULAIR) 10 MG tablet TAKE 1 TABLET BY MOUTH EVERY NIGHT AT  BEDTIME 10/16/20   Brand Males, MD  Multiple Vitamin (MULTIVITAMIN WITH MINERALS) TABS tablet Take 1 tablet by mouth daily. Centrum Silver Multivitamin    [provider]  Multiple Vitamins-Minerals (PRESERVISION AREDS PO) Take 1 tablet by mouth 2 (two) times daily.    [provider]  mupirocin ointment (BACTROBAN) 2 % as needed.     [provider]  NEXIUM 40 MG capsule TAKE 1 CAPSULE BY MOUTH TWICE A DAY 10/15/20   Brand Males, MD  nitroGLYCERIN (NITROSTAT) 0.4 MG SL tablet Place 1 tablet (0.4 mg total) under the tongue every 5 (five) minutes as needed for chest pain. 06/15/20   Nahser, Wonda Cheng, MD  potassium chloride (KLOR-CON) 10 MEQ tablet TAKE 1 TABLET BY MOUTH EVERY DAY 10/15/20   Nahser, Wonda Cheng, MD  pramipexole (MIRAPEX) 0.125 MG tablet Take 0.375 mg by mouth at bedtime.    [provider]  rivaroxaban (XARELTO) 10 MG TABS tablet Take 10 mg by mouth daily.    [provider]  rosuvastatin (CRESTOR) 5 MG tablet TAKE 1 TABLET BY MOUTH EVERYDAY AT BEDTIME 10/10/20   Nahser, Wonda Cheng, MD  Spacer/Aero-Holding Chambers (AEROCHAMBER MV) inhaler by Other route. Use as instructed     [provider]  telmisartan (MICARDIS) 20 MG tablet Take 20 mg by mouth daily.     [provider]  traMADol (ULTRAM) 50 MG tablet Take 50 mg by mouth at bedtime as needed. 01/11/21   [provider]  vitamin B-12 (CYANOCOBALAMIN) 500 MCG tablet Take 500 mcg by mouth daily.      [provider]    Physical Exam: BP (!) 134/57 (BP Location: Left Arm)   Pulse 78   Temp (!) 97.5 F (36.4 C) (Oral)   Resp 18   Ht 5\' 5"  (1.651 m)   Wt 63.5 kg   SpO2 92%   BMI 23.30 kg/m   General: Elderly female. Awake and alert and oriented x3. No acute cardiopulmonary distress.  HEENT: Normocephalic atraumatic.  Right  and left ears normal in appearance.  Pupils equal, round, reactive to light. Extraocular muscles are intact. Sclerae anicteric  and noninjected.  Moist mucosal membranes. No mucosal lesions.  Neck: Neck supple without lymphadenopathy. No carotid bruits. No masses palpated.  Cardiovascular: Regular rate with normal S1-S2 sounds. No murmurs, rubs, gallops auscultated. No JVD.  Respiratory: Good respiratory effort with no wheezes, rales, rhonchi. Lungs clear to auscultation bilaterally.  No accessory muscle use. Abdomen: Soft, nontender, nondistended. Active bowel sounds. No masses or hepatosplenomegaly  Skin: No rashes, lesions, or ulcerations.  Dry, warm to touch. 2+ dorsalis pedis and radial pulses. Musculoskeletal: Right leg shortened. All major joints not erythematous nontender.  No upper or lower joint deformation.  Good ROM.  No contractures  Psychiatric: Intact judgment and insight. Pleasant and cooperative. Neurologic: No focal neurological deficits. Strength is 5/5 and symmetric in upper and lower extremities.  Cranial nerves II through XII are grossly intact.           Labs on Admission: I have personally reviewed following labs and imaging studies  CBC: Recent Labs  Lab 03/31/21 1443  WBC 9.2  NEUTROABS 7.2  HGB 12.0  HCT 37.8  MCV 92.9  PLT 546   Basic Metabolic Panel: Recent Labs  Lab 03/31/21 1443  NA 136  K 3.8  CL 98  CO2 28  GLUCOSE 164*  BUN 20  CREATININE 1.49*  CALCIUM 8.8*   GFR: Estimated Creatinine Clearance: 24.4 mL/min (A) (by C-G formula based on SCr of 1.49 mg/dL (H)). Liver Function Tests: No results for input(s): AST, ALT, ALKPHOS, BILITOT, PROT, ALBUMIN in the last 168 hours. No results for input(s): LIPASE, AMYLASE in the last 168 hours. No results for input(s): AMMONIA in the last 168 hours. Coagulation Profile: No results for input(s): INR, PROTIME in the last 168 hours. Cardiac Enzymes: No results for input(s): CKTOTAL, CKMB, CKMBINDEX, TROPONINI in the last 168 hours. BNP (last 3 results) No results for input(s): PROBNP in the last 8760 hours. HbA1C: No  results for input(s): HGBA1C in the last 72 hours. CBG: No results for input(s): GLUCAP in the last 168 hours. Lipid Profile: No results for input(s): CHOL, HDL, LDLCALC, TRIG, CHOLHDL, LDLDIRECT in the last 72 hours. Thyroid Function Tests: No results for input(s): TSH, T4TOTAL, FREET4, T3FREE, THYROIDAB in the last 72 hours. Anemia Panel: No results for input(s): VITAMINB12, FOLATE, FERRITIN, TIBC, IRON, RETICCTPCT in the last 72 hours. Urine analysis:    Component Value Date/Time   COLORURINE YELLOW 08/22/2019 1001   APPEARANCEUR CLEAR 08/22/2019 1001   LABSPEC 1.010 08/22/2019 1001   PHURINE < OR = 5.0 08/22/2019 1001   GLUCOSEU NEGATIVE 08/22/2019 1001   HGBUR NEGATIVE 08/22/2019 1001   BILIRUBINUR NEGATIVE 12/19/2016 0522   KETONESUR NEGATIVE 08/22/2019 1001   PROTEINUR NEGATIVE 08/22/2019 1001   UROBILINOGEN 0.2 09/27/2012 1349   NITRITE NEGATIVE 08/22/2019 1001   LEUKOCYTESUR NEGATIVE 08/22/2019 1001   Sepsis Labs: @LABRCNTIP (procalcitonin:4,lacticidven:4) )No results found for this or any previous visit (from the past 240 hour(s)).   Radiological Exams on Admission: DG Shoulder Right  Result Date: 03/31/2021 CLINICAL DATA:  [place], right shoulder pain EXAM: RIGHT SHOULDER - 2+ VIEW COMPARISON:  09/07/2018 FINDINGS: Frontal and transscapular views of the right shoulder are obtained. Prior healed fracture of the greater tuberosity of the proximal right humerus. No acute fracture, subluxation, or dislocation. Moderate spurring of the acromioclavicular joint. Marked narrowing of the acromial humeral interval consistent with chronic longstanding rotator cuff tear. Visualized portions  of the right chest are clear. IMPRESSION: 1. Chronic posttraumatic and degenerative changes. No acute bony abnormality. Electronically Signed   By: Randa Ngo M.D.   On: 03/31/2021 16:02   CT Head Wo Contrast  Result Date: 03/31/2021 CLINICAL DATA:  Fall, pain EXAM: CT HEAD WITHOUT CONTRAST CT  CERVICAL SPINE WITHOUT CONTRAST TECHNIQUE: Multidetector CT imaging of the head and cervical spine was performed following the standard protocol without intravenous contrast. Multiplanar CT image reconstructions of the cervical spine were also generated. COMPARISON:  None. FINDINGS: CT HEAD FINDINGS Brain: No evidence of acute infarction, hemorrhage, hydrocephalus, extra-axial collection or mass lesion/mass effect. Vascular: No hyperdense vessel or unexpected calcification. Skull: Normal. Negative for fracture or focal lesion. Sinuses/Orbits: No acute finding. Other: Soft tissue contusion of the right parietal scalp (series 2, image 14). CT CERVICAL SPINE FINDINGS Alignment: Degenerative straightening and reversal of the normal cervical lordosis. Skull base and vertebrae: No acute fracture. No primary bone lesion or focal pathologic process. Soft tissues and spinal canal: No prevertebral fluid or swelling. No visible canal hematoma. Disc levels: Moderate multilevel disc degenerative disease and osteophytosis of C4 through C7 with otherwise preserved disc spaces. Upper chest: Negative. Other: None. IMPRESSION: 1. No acute intracranial pathology. 2. Soft tissue contusion of the right parietal scalp. 3. No fracture or static subluxation of the cervical spine. 4. Moderate multilevel disc degenerative disease and osteophytosis of C4 through C7. Electronically Signed   By: Delanna Ahmadi M.D.   On: 03/31/2021 15:27   CT Cervical Spine Wo Contrast  Result Date: 03/31/2021 CLINICAL DATA:  Fall, pain EXAM: CT HEAD WITHOUT CONTRAST CT CERVICAL SPINE WITHOUT CONTRAST TECHNIQUE: Multidetector CT imaging of the head and cervical spine was performed following the standard protocol without intravenous contrast. Multiplanar CT image reconstructions of the cervical spine were also generated. COMPARISON:  None. FINDINGS: CT HEAD FINDINGS Brain: No evidence of acute infarction, hemorrhage, hydrocephalus, extra-axial collection or  mass lesion/mass effect. Vascular: No hyperdense vessel or unexpected calcification. Skull: Normal. Negative for fracture or focal lesion. Sinuses/Orbits: No acute finding. Other: Soft tissue contusion of the right parietal scalp (series 2, image 14). CT CERVICAL SPINE FINDINGS Alignment: Degenerative straightening and reversal of the normal cervical lordosis. Skull base and vertebrae: No acute fracture. No primary bone lesion or focal pathologic process. Soft tissues and spinal canal: No prevertebral fluid or swelling. No visible canal hematoma. Disc levels: Moderate multilevel disc degenerative disease and osteophytosis of C4 through C7 with otherwise preserved disc spaces. Upper chest: Negative. Other: None. IMPRESSION: 1. No acute intracranial pathology. 2. Soft tissue contusion of the right parietal scalp. 3. No fracture or static subluxation of the cervical spine. 4. Moderate multilevel disc degenerative disease and osteophytosis of C4 through C7. Electronically Signed   By: Delanna Ahmadi M.D.   On: 03/31/2021 15:27   DG Hip Unilat With Pelvis 2-3 Views Right  Result Date: 03/31/2021 CLINICAL DATA:  [place], right hip pain and EXAM: DG HIP (WITH OR WITHOUT PELVIS) 2-3V RIGHT COMPARISON:  09/07/2018 FINDINGS: Frontal view of the pelvis as well as frontal and cross-table lateral views of the right hip are obtained. There is a subcapital right femoral neck fracture with mild impaction and valgus angulation. No dislocation. No other acute displaced fractures. Unremarkable left hip arthroplasty. IMPRESSION: 1. Subcapital right femoral neck fracture with mild impaction and valgus angulation. Electronically Signed   By: Randa Ngo M.D.   On: 03/31/2021 16:00    EKG: Independently reviewed.  Sinus rhythm with  left bundle branch block.  Possible old anterior MI.  No acute ST changes.  Assessment/Plan: Principal Problem:   Femur fracture, right (HCC) Active Problems:   GERD   History of DVT of lower  extremity   CKD (chronic kidney disease) stage 3, GFR 30-59 ml/min (HCC)   CAD (coronary artery disease), native coronary artery   Chronic diastolic CHF (congestive heart failure) ([place])   Essential hypertension    This patient was discussed with the ED physician, including pertinent vitals, physical exam findings, labs, and imaging.  We also discussed care given by the ED provider.  Right hip fracture Will admit to [facility].  We will have EDP contact [facility], who is the patient's orthopedist and have them consult on the patient when she arrives at [facility] As the patient is on anticoagulation, will hold off on surgery tomorrow.  Last dose of anticoagulation was last night. Pain control Repeat CBC and CMP in the morning History of DVT Hold anticoagulation SCDs Stage II/III chronic kidney disease Appears at baseline Coronary artery disease with CHF and hypertension Continue home medication  DVT prophylaxis: SCDs Consultants: Orthopedics Code Status: Full code Family Communication: Husband present during interview and exam Disposition Plan: Patient will need skilled nursing rehab   Truett Mainland, DO

## 2021-03-31 NOTE — ED Triage Notes (Addendum)
[place] by [facility] [place] with c/o fall today off a riser which was about 1 foot high. Denies LOC. She has small area of swelling to right side of head, c/o pain to right shoulder and hip. Pt c/o "not feeling right", dizziness and nausea since the fall. Pt takes Xarelto.

## 2021-03-31 NOTE — ED Notes (Signed)
[facility] called for transport at this time to [id number]. [name]

## 2021-03-31 NOTE — ED Provider Notes (Addendum)
[facility] EMERGENCY DEPARTMENT Provider Note   CSN: [id number] Arrival date & time: 03/31/21  1415     History Chief Complaint  Patient presents with   [name] is a 87 y.o. female.  With past medical history of asthma, chronic kidney disease, GERD, osteoporosis, CAD, hypertension who presents to the emergency department with fall on anticoagulation..  She states this morning she was at church helping set up for Christmas when she was on a riser.  She states that her foot slipped while coming down the riser and she fell.  States that she hit her head on the railing and fell onto her right hip and shoulder.  She states since falling she has had right hip and shoulder pain, dizziness and nausea with dry heaving.  She denies any chest pain, palpitations, lightheadedness or dizziness, shortness of breath prior to falling.   Fall Pertinent negatives include no chest pain, no abdominal pain and no shortness of breath.      Past Medical History:  Diagnosis Date   Anemia    after hysterectomy   Angio-edema    Asthma, severe persistent    pulmologist-- dr Lynford Citizen   Cancer [facility])    Breast cancer on right   Chronic kidney disease, stage II (mild)    DDD (degenerative disc disease), cervical    Diverticulitis    Diverticulosis yrs ago   hx of    Edema, lower extremity    occ both legs swell   GERD (gastroesophageal reflux disease)    Headache    sinus headaches   Heart murmur    no problems - per pt   History of breast cancer 1990 left mastectomy also   1989  S/P   RIGHT MASTECTOMY ;  NO CHEMORADIATION //   NO RECURRENCE   History of DVT of lower extremity 5 yrs ago   right leg   History of shingles    Hyperlipidemia    Internal hemorrhoid    Leg ulcer, left (HCC)    Osteoporosis, unspecified    Knee and hip osteoarthritis bilaterally   Perennial allergic rhinitis    Peripheral vascular disease (HCC)    Pneumonia    Restless legs syndrome (RLS)     Rheumatoid arthritis ([place])    hands   Thyroid nodule    followed by dr perrini yearly, no current problam   Unspecified essential hypertension    Urticaria     Patient Active Problem List   Diagnosis Date Noted   Unstable angina ([place]) 06/15/2020   Adverse effect of other viral vaccines, subsequent encounter 04/17/2020   Chest pain 12/22/2018   Displaced fracture of greater tuberosity of right humerus, initial encounter for closed fracture 09/21/2018   Closed fracture of right proximal humerus 09/21/2018   S/P left TH revision 12/03/2017   Essential hypertension 11/17/2017   Chronic diastolic CHF (congestive heart failure) ([place]) 03/19/2017   CAD (coronary artery disease), native coronary artery 12/20/2016   Closed left hip fracture ([place]) 12/19/2016   CKD (chronic kidney disease) stage 3, GFR 30-59 ml/min ([place]) 12/19/2016   History of DVT of lower extremity    Lung nodule 08/03/2014   Peripheral vascular disease, unspecified ([place]) 09/12/2013   Hyperlipidemia 03/03/2013   Expected blood loss anemia 10/06/2012   Overweight (BMI 25.0-29.9) 10/06/2012   DJD (degenerative joint disease) 05/26/2012   Severe persistent asthma 08/05/2007   Hypertensive heart disease without CHF 04/30/2007   Allergic rhinitis 01/13/2007  GERD 01/13/2007    Past Surgical History:  Procedure Laterality Date   CARDIAC CATHETERIZATION  08/ 01/ 2008   dr Cathie Olden   normal -- EF of 65%   CARDIOVASCULAR STRESS TEST  05-22-2011   dr Cathie Olden   normal lexiscan perfusion study/  no ischemia/  lvsf  86%   CATARACT EXTRACTION W/ INTRAOCULAR LENS  IMPLANT, BILATERAL     CHOLECYSTECTOMY  1993   laparoscopic   CONVERSION TO TOTAL HIP Left 12/03/2017   Procedure: CONVERSION TO LEFT TOTAL HIP;  Surgeon: Paralee Cancel, MD;  Location: [facility];  Service: Orthopedics;  Laterality: Left;  90 mins   HIP ARTHROPLASTY Left 12/22/2016   Procedure: HEMIARTHROPLASTY, LEFT;  Surgeon: Paralee Cancel, MD;  Location: [facility];  Service:  Orthopedics;  Laterality: Left;   INCISION AND DRAINAGE OF WOUND Left 10/24/2013   Procedure: IRRIGATION AND DEBRIDEMENT OF LEFT LEG WITH PLACEMENT OF ACELL AND WOUND [place];  Surgeon: Theodoro Kos, DO;  Location: [place];  Service: Plastics;  Laterality: Left;   KNEE ARTHROPLASTY     LUMBAR [place] Bilateral rigth 1989///   left  1990   breast cancer 1989//   fibrocytic disease 1990   ORIF HUMERUS FRACTURE Right 09/21/2018   Procedure: OPEN REDUCTION INTERNAL FIXATION (ORIF) PROXIMAL HUMERUS FRACTURE;  Surgeon: Nicholes Stairs, MD;  Location: [facility];  Service: Orthopedics;  Laterality: Right;  90 mins   TOTAL ABDOMINAL HYSTERECTOMY W/ BILATERAL SALPINGOOPHORECTOMY  1989   done at same time as right mastectomy   TOTAL KNEE ARTHROPLASTY Right 10/05/2012   Procedure: RIGHT TOTAL KNEE ARTHROPLASTY;  Surgeon: Mauri Pole, MD;  Location: [facility];  Service: Orthopedics;  Laterality: Right;     OB History   No obstetric history on file.     Family History  Problem Relation Age of Onset   Heart attack Mother    Asthma Mother    Heart disease Mother    Hyperlipidemia Mother    Allergic rhinitis Mother    Heart attack Father    Heart failure Father    Deep vein thrombosis Father    Heart disease Father    Peripheral vascular disease Father        amputation   Asthma Brother    Allergic rhinitis Brother    Asthma Sister    Eczema Sister    Allergic rhinitis Sister    Cancer Brother    Asthma Brother    Coronary artery disease Sister    Heart disease Sister    Asthma Maternal Grandmother    Urticaria Neg Hx     Social History   Tobacco Use   Smoking status: Never   Smokeless tobacco: Never  Vaping Use   Vaping Use: Never used  Substance Use Topics   Alcohol use: No   Drug use: No    Home Medications Prior to Admission medications   Medication Sig Start Date End Date Taking? Authorizing Provider  acetaminophen  (TYLENOL) 500 MG tablet Take 2 tablets (1,000 mg total) by mouth every 8 (eight) hours. 12/03/17   Danae Orleans, PA-C  albuterol (PROAIR HFA) 108 (90 Base) MCG/ACT inhaler Inhale 2 puffs into the lungs every 6 (six) hours as needed for wheezing or shortness of breath. 12/26/20   Brand Males, MD  albuterol (PROVENTIL) (2.5 MG/3ML) 0.083% nebulizer solution Take 3 mLs (2.5 mg total) by nebulization every 6 (six) hours as needed for wheezing  or shortness of breath. Dx: J45.41 09/03/17   Brand Males, MD  benzonatate (TESSALON) 100 MG capsule Take 1 capsule (100 mg total) by mouth 3 (three) times daily as needed for cough. 12/02/17   Brand Males, MD  budesonide-formoterol (SYMBICORT) 160-4.5 MCG/ACT inhaler Inhale 2 puffs into the lungs 2 (two) times daily. 12/26/20   Brand Males, MD  Calcium Carbonate-Vitamin D 600-400 MG-UNIT tablet Take 1 tablet by mouth daily.    [provider]  chlorpheniramine (CHLOR-TRIMETON) 4 MG tablet Take 4-8 mg by mouth See admin instructions. 4 mg in the morning and 8 mg at bedtime 01/12/13   Elsie Stain, MD  Cholecalciferol (VITAMIN D3) 1000 UNITS tablet Take 1,000 Units by mouth every evening.     [provider]  dextromethorphan (DELSYM) 30 MG/5ML liquid Take 60 mg by mouth 2 (two) times daily as needed for cough.    [provider]  dextromethorphan-guaiFENesin (MUCINEX DM) 30-600 MG 12hr tablet Take 1 tablet by mouth daily as needed for cough.    [provider]  Dupilumab (DUPIXENT) 300 MG/2ML SOPN INJECT 1 PEN UNDER THE SKIN EVERY OTHER WEEK 01/31/21   Brand Males, MD  fexofenadine (ALLEGRA) 180 MG tablet Take 180 mg by mouth daily.   12/02/10   Elsie Stain, MD  furosemide (LASIX) 20 MG tablet TAKE 1 TABLET (20 MG TOTAL) BY MOUTH DAILY AS NEEDED FOR FLUID OR EDEMA. 09/25/20   Nahser, Wonda Cheng, MD  ibandronate (BONIVA) 150 MG tablet Take 1 tablet by mouth every 30 (thirty) days.  03/31/18   [provider]  ipratropium (ATROVENT) 0.06 % nasal spray Place 2 sprays into both nostrils 2 (two) times daily. 01/24/19   [provider]  mometasone (NASONEX) 50 MCG/ACT nasal spray USE 2 SPRAYS NASALLY 2 TIMES DAILY 12/16/19   Brand Males, MD  montelukast (SINGULAIR) 10 MG tablet TAKE 1 TABLET BY MOUTH EVERY NIGHT AT BEDTIME 10/16/20   Brand Males, MD  Multiple Vitamin (MULTIVITAMIN WITH MINERALS) TABS tablet Take 1 tablet by mouth daily. Centrum Silver Multivitamin    [provider]  Multiple Vitamins-Minerals (PRESERVISION AREDS PO) Take 1 tablet by mouth 2 (two) times daily.    [provider]  mupirocin ointment (BACTROBAN) 2 % as needed.     [provider]  NEXIUM 40 MG capsule TAKE 1 CAPSULE BY MOUTH TWICE A DAY 10/15/20   Brand Males, MD  nitroGLYCERIN (NITROSTAT) 0.4 MG SL tablet Place 1 tablet (0.4 mg total) under the tongue every 5 (five) minutes as needed for chest pain. 06/15/20   Nahser, Wonda Cheng, MD  potassium chloride (KLOR-CON) 10 MEQ tablet TAKE 1 TABLET BY MOUTH EVERY DAY 10/15/20   Nahser, Wonda Cheng, MD  pramipexole (MIRAPEX) 0.125 MG tablet Take 0.375 mg by mouth at bedtime.    [provider]  rivaroxaban (XARELTO) 10 MG TABS tablet Take 10 mg by mouth daily.    [provider]  rosuvastatin (CRESTOR) 5 MG tablet TAKE 1 TABLET BY MOUTH EVERYDAY AT BEDTIME 10/10/20   Nahser, Wonda Cheng, MD  Spacer/Aero-Holding Chambers (AEROCHAMBER MV) inhaler by Other route. Use as instructed     [provider]  telmisartan (MICARDIS) 20 MG tablet Take 20 mg by mouth daily.     [provider]  vitamin B-12 (CYANOCOBALAMIN) 500 MCG tablet Take 500 mcg by mouth daily.      [provider]    Allergies    Egg phospholipids, Eggs or egg-derived  products, Influenza vaccines, Nucala [mepolizumab], Cephalosporins, Codeine, Gabapentin, Levofloxacin, and Pneumococcal vaccines  Review of Systems   Review of  Systems  Constitutional:  Negative for fever.  HENT:  Negative for dental problem and facial swelling.   Respiratory:  Negative for shortness of breath.   Cardiovascular:  Negative for chest pain and palpitations.  Gastrointestinal:  Positive for nausea and vomiting. Negative for abdominal pain.  Musculoskeletal:  Positive for arthralgias and neck pain.  Neurological:  Positive for dizziness.  Psychiatric/Behavioral:  Negative for confusion.   All other systems reviewed and are negative.  Physical Exam Updated Vital Signs There were no vitals taken for this visit.  Physical Exam Vitals and nursing note reviewed.  Constitutional:      General: She is not in acute distress.    Appearance: Normal appearance. She is normal weight. She is not toxic-appearing.  HENT:     Head: Normocephalic. Contusion present.      Nose: Nose normal.     Mouth/Throat:     Mouth: Mucous membranes are moist.     Pharynx: Oropharynx is clear.  Eyes:     General: No scleral icterus.    Extraocular Movements: Extraocular movements intact.     Pupils: Pupils are equal, round, and reactive to light.  Neck:     Comments: cervical collar in place Cardiovascular:     Rate and Rhythm: Normal rate and regular rhythm.     Pulses: Normal pulses.     Heart sounds: No murmur heard. Pulmonary:     Effort: Pulmonary effort is normal. No respiratory distress.     Breath sounds: Normal breath sounds.  Abdominal:     General: Bowel sounds are normal. There is no distension.     Palpations: Abdomen is soft.     Tenderness: There is no abdominal tenderness.  Musculoskeletal:        General: Tenderness present.     Right shoulder: Bony tenderness present. Decreased strength. Normal pulse.     Left shoulder: Normal.       Arms:     Cervical back: Tenderness present. Spinous process tenderness present.     Right hip: Tenderness and bony tenderness present. No deformity. Decreased strength.     Left hip: Normal.      Right lower leg: No edema.     Left lower leg: No edema.     Comments: Right shoulder with previous surgical intervention and deformity.  Contusion to the right proximal arm.  Radial pulse 2+, sensation intact.  Strength 4/5 limited by pain  Skin:    General: Skin is warm and dry.     Capillary Refill: Capillary refill takes less than 2 seconds.  Neurological:     General: No focal deficit present.     Mental Status: She is alert and oriented to person, place, and time. Mental status is at baseline.     Cranial Nerves: No cranial nerve deficit.     Sensory: No sensory deficit.  Psychiatric:        Mood and Affect: Mood normal.        Behavior: Behavior normal.    ED Results / Procedures / Treatments   Labs (all labs ordered are listed, but only abnormal results are displayed) Labs Reviewed  BASIC METABOLIC PANEL - Abnormal; Notable for the following components:      Result Value   Glucose, Bld 164 (*)    Creatinine, Ser 1.49 (*)    Calcium 8.8 (*)  GFR, Estimated 34 (*)    All other components within normal limits  CBC WITH DIFFERENTIAL/PLATELET - Abnormal; Notable for the following components:   Abs Immature Granulocytes 0.08 (*)    All other components within normal limits  RESP PANEL BY RT-PCR (FLU A&B, COVID) [id number]   EKG None  Radiology DG Shoulder Right  Result Date: 03/31/2021 CLINICAL DATA:  [place], right shoulder pain EXAM: RIGHT SHOULDER - 2+ VIEW COMPARISON:  09/07/2018 FINDINGS: Frontal and transscapular views of the right shoulder are obtained. Prior healed fracture of the greater tuberosity of the proximal right humerus. No acute fracture, subluxation, or dislocation. Moderate spurring of the acromioclavicular joint. Marked narrowing of the acromial humeral interval consistent with chronic longstanding rotator cuff tear. Visualized portions of the right chest are clear. IMPRESSION: 1. Chronic posttraumatic and degenerative changes. No acute bony abnormality.  Electronically Signed   By: Randa Ngo M.D.   On: 03/31/2021 16:02   CT Head Wo Contrast  Result Date: 03/31/2021 CLINICAL DATA:  Fall, pain EXAM: CT HEAD WITHOUT CONTRAST CT CERVICAL SPINE WITHOUT CONTRAST TECHNIQUE: Multidetector CT imaging of the head and cervical spine was performed following the standard protocol without intravenous contrast. Multiplanar CT image reconstructions of the cervical spine were also generated. COMPARISON:  None. FINDINGS: CT HEAD FINDINGS Brain: No evidence of acute infarction, hemorrhage, hydrocephalus, extra-axial collection or mass lesion/mass effect. Vascular: No hyperdense vessel or unexpected calcification. Skull: Normal. Negative for fracture or focal lesion. Sinuses/Orbits: No acute finding. Other: Soft tissue contusion of the right parietal scalp (series 2, image 14). CT CERVICAL SPINE FINDINGS Alignment: Degenerative straightening and reversal of the normal cervical lordosis. Skull base and vertebrae: No acute fracture. No primary bone lesion or focal pathologic process. Soft tissues and spinal canal: No prevertebral fluid or swelling. No visible canal hematoma. Disc levels: Moderate multilevel disc degenerative disease and osteophytosis of C4 through C7 with otherwise preserved disc spaces. Upper chest: Negative. Other: None. IMPRESSION: 1. No acute intracranial pathology. 2. Soft tissue contusion of the right parietal scalp. 3. No fracture or static subluxation of the cervical spine. 4. Moderate multilevel disc degenerative disease and osteophytosis of C4 through C7. Electronically Signed   By: Delanna Ahmadi M.D.   On: 03/31/2021 15:27   CT Cervical Spine Wo Contrast  Result Date: 03/31/2021 CLINICAL DATA:  Fall, pain EXAM: CT HEAD WITHOUT CONTRAST CT CERVICAL SPINE WITHOUT CONTRAST TECHNIQUE: Multidetector CT imaging of the head and cervical spine was performed following the standard protocol without intravenous contrast. Multiplanar CT image  reconstructions of the cervical spine were also generated. COMPARISON:  None. FINDINGS: CT HEAD FINDINGS Brain: No evidence of acute infarction, hemorrhage, hydrocephalus, extra-axial collection or mass lesion/mass effect. Vascular: No hyperdense vessel or unexpected calcification. Skull: Normal. Negative for fracture or focal lesion. Sinuses/Orbits: No acute finding. Other: Soft tissue contusion of the right parietal scalp (series 2, image 14). CT CERVICAL SPINE FINDINGS Alignment: Degenerative straightening and reversal of the normal cervical lordosis. Skull base and vertebrae: No acute fracture. No primary bone lesion or focal pathologic process. Soft tissues and spinal canal: No prevertebral fluid or swelling. No visible canal hematoma. Disc levels: Moderate multilevel disc degenerative disease and osteophytosis of C4 through C7 with otherwise preserved disc spaces. Upper chest: Negative. Other: None. IMPRESSION: 1. No acute intracranial pathology. 2. Soft tissue contusion of the right parietal scalp. 3. No fracture or static subluxation of the cervical spine. 4. Moderate multilevel disc degenerative disease and osteophytosis of C4 through C7. Electronically  Signed   By: Delanna Ahmadi M.D.   On: 03/31/2021 15:27   DG Hip Unilat With Pelvis 2-3 Views Right  Result Date: 03/31/2021 CLINICAL DATA:  [place], right hip pain and EXAM: DG HIP (WITH OR WITHOUT PELVIS) 2-3V RIGHT COMPARISON:  09/07/2018 FINDINGS: Frontal view of the pelvis as well as frontal and cross-table lateral views of the right hip are obtained. There is a subcapital right femoral neck fracture with mild impaction and valgus angulation. No dislocation. No other acute displaced fractures. Unremarkable left hip arthroplasty. IMPRESSION: 1. Subcapital right femoral neck fracture with mild impaction and valgus angulation. Electronically Signed   By: Randa Ngo M.D.   On: 03/31/2021 16:00    Procedures Procedures   Medications Ordered in  ED Medications  fentaNYL (SUBLIMAZE) injection 50 mcg (has no administration in time range)  ondansetron (ZOFRAN) injection 4 mg (4 mg Intravenous Given by Other 03/31/21 1440)  fentaNYL (SUBLIMAZE) injection 25 mcg (25 mcg Intravenous Given 03/31/21 1451)    ED Course  I have reviewed the triage vital signs and the nursing notes.  Pertinent labs & imaging results that were available during my care of the patient were reviewed by me and considered in my medical decision making (see chart for details).    MDM Rules/Calculators/A&P 87 year old female who presents emergency department after mechanical fall on anticoagulation.  She denies any prodromal symptoms such as dizziness, lightheadedness, chest pain, shortness of breath, tunnel vision prior to fall.  There was no loss of consciousness.  I have low suspicion that there was any cardiopulmonary reason for fall. CT head and C-spine without acute abnormalities. X-ray of right shoulder without abnormalities X-ray of the right hip with subcapital right femoral neck fracture and mild impaction of valgus angulation. Orthopedic surgery consulted -spoke with Dr. Lucia Gaskins with orthopedic surgery who agrees to see patient and with medicine admission to [place] with hospitalist, Dr. Nehemiah Settle, who agrees to admit the patient at this time. Final Clinical Impression(s) / ED Diagnoses Final diagnoses:  Fall, initial encounter    Rx / DC Orders ED Discharge Orders     None        Mickie Hillier, PA-C 03/31/21 1631    Mickie Hillier, PA-C 03/31/21 1631    [place], Alvin Critchley, DO 04/01/21 [phone]

## 2021-04-01 DIAGNOSIS — S72044A Nondisplaced fracture of base of neck of right femur, initial encounter for closed fracture: Secondary | ICD-10-CM | POA: Diagnosis not present

## 2021-04-01 LAB — BASIC METABOLIC PANEL
Anion gap: 8 (ref 5–15)
BUN: 19 mg/dL (ref 8–23)
CO2: 29 mmol/L (ref 22–32)
Calcium: 8.6 mg/dL — ABNORMAL LOW (ref 8.9–10.3)
Chloride: 98 mmol/L (ref 98–111)
Creatinine, Ser: 1.33 mg/dL — ABNORMAL HIGH (ref 0.44–1.00)
GFR, Estimated: 39 mL/min — ABNORMAL LOW (ref >60)
Glucose, Bld: 137 mg/dL — ABNORMAL HIGH (ref 70–99)
Potassium: 4.4 mmol/L (ref 3.5–5.1)
Sodium: 135 mmol/L (ref 135–145)

## 2021-04-01 LAB — CBC
HCT: 32.6 % — ABNORMAL LOW (ref 36.0–46.0)
Hemoglobin: 10.4 g/dL — ABNORMAL LOW (ref 12.0–15.0)
MCH: 29.5 pg (ref 26.0–34.0)
MCHC: 31.9 g/dL (ref 30.0–36.0)
MCV: 92.6 fL (ref 80.0–100.0)
Platelets: 179 10*3/uL (ref 150–400)
RBC: 3.52 MIL/uL — ABNORMAL LOW (ref 3.87–5.11)
RDW: 12.9 % (ref 11.5–15.5)
WBC: 5.4 10*3/uL (ref 4.0–10.5)
nRBC: 0 % (ref 0.0–0.2)

## 2021-04-01 LAB — PROTIME-INR
INR: 1.1 (ref 0.8–1.2)
Prothrombin Time: 14.3 seconds (ref 11.4–15.2)

## 2021-04-01 LAB — SURGICAL PCR SCREEN
MRSA, PCR: NEGATIVE
Staphylococcus aureus: NEGATIVE

## 2021-04-01 MED ORDER — TRAMADOL HCL 50 MG PO TABS
50.0000 mg | ORAL_TABLET | Freq: Two times a day (BID) | ORAL | Status: DC | PRN
  Administered 2021-04-01: 50 mg via ORAL
  Filled 2021-04-01: qty 1

## 2021-04-01 MED ORDER — FENTANYL CITRATE PF 50 MCG/ML IJ SOSY
25.0000 ug | PREFILLED_SYRINGE | INTRAMUSCULAR | Status: DC | PRN
  Administered 2021-04-01 (×3): 25 ug via INTRAVENOUS
  Filled 2021-04-01 (×3): qty 1

## 2021-04-01 MED ORDER — FENTANYL CITRATE PF 50 MCG/ML IJ SOSY
50.0000 ug | PREFILLED_SYRINGE | Freq: Once | INTRAMUSCULAR | Status: AC
  Administered 2021-04-01: 50 ug via INTRAVENOUS
  Filled 2021-04-01: qty 1

## 2021-04-01 NOTE — Consult Note (Signed)
Reason for Consult:Right hip fx Referring Physician: Dessa Phi Time called: [id number] Time at bedside: [place] is an 87 y.o. female.  HPI: [name] was at home and slipped on a step and fell. [name] had immediate right hip pain and could not get up. [name] also hit her head. [name] was brought to the ED where x-rays showed a right hip fx. [name] was transferred to [facility] and orthopedic surgery was consulted. [name] lives with her husband and does not use any assistive devices to ambulate.  Past Medical History:  Diagnosis Date   Anemia    after hysterectomy   Angio-edema    Asthma, severe persistent    pulmologist-- dr Lynford Citizen   Cancer [facility])    Breast cancer on right   Chronic kidney disease, stage II (mild)    DDD (degenerative disc disease), cervical    Diverticulitis    Diverticulosis yrs ago   hx of    Edema, lower extremity    occ both legs swell   GERD (gastroesophageal reflux disease)    Headache    sinus headaches   Heart murmur    no problems - per pt   History of breast cancer 1990 left mastectomy also   1989  S/P   RIGHT MASTECTOMY ;  NO CHEMORADIATION //   NO RECURRENCE   History of DVT of lower extremity 5 yrs ago   right leg   History of shingles    Hyperlipidemia    Internal hemorrhoid    Leg ulcer, left (HCC)    Osteoporosis, unspecified    Knee and hip osteoarthritis bilaterally   Perennial allergic rhinitis    Peripheral vascular disease (HCC)    Pneumonia    Restless legs syndrome (RLS)    Rheumatoid arthritis ([place])    hands   Thyroid nodule    followed by dr perrini yearly, no current problam   Unspecified essential hypertension    Urticaria     Past Surgical History:  Procedure Laterality Date   CARDIAC CATHETERIZATION  08/ 01/ 2008   dr Cathie Olden   normal -- EF of 65%   CARDIOVASCULAR STRESS TEST  05-22-2011   dr Cathie Olden   normal lexiscan perfusion study/  no ischemia/  lvsf  86%   CATARACT EXTRACTION W/ INTRAOCULAR LENS  IMPLANT, BILATERAL      CHOLECYSTECTOMY  1993   laparoscopic   CONVERSION TO TOTAL HIP Left 12/03/2017   Procedure: CONVERSION TO LEFT TOTAL HIP;  Surgeon: Paralee Cancel, MD;  Location: [facility];  Service: Orthopedics;  Laterality: Left;  90 mins   HIP ARTHROPLASTY Left 12/22/2016   Procedure: HEMIARTHROPLASTY, LEFT;  Surgeon: Paralee Cancel, MD;  Location: [facility];  Service: Orthopedics;  Laterality: Left;   INCISION AND DRAINAGE OF WOUND Left 10/24/2013   Procedure: IRRIGATION AND DEBRIDEMENT OF LEFT LEG WITH PLACEMENT OF ACELL AND WOUND [place];  Surgeon: Theodoro Kos, DO;  Location: [place];  Service: Plastics;  Laterality: Left;   KNEE ARTHROPLASTY     LUMBAR [place] Bilateral rigth 1989///   left  1990   breast cancer 1989//   fibrocytic disease 1990   ORIF HUMERUS FRACTURE Right 09/21/2018   Procedure: OPEN REDUCTION INTERNAL FIXATION (ORIF) PROXIMAL HUMERUS FRACTURE;  Surgeon: Nicholes Stairs, MD;  Location: [place];  Service: Orthopedics;  Laterality: Right;  90 mins   TOTAL ABDOMINAL HYSTERECTOMY W/ BILATERAL SALPINGOOPHORECTOMY  1989  done at same time as right mastectomy   TOTAL KNEE ARTHROPLASTY Right 10/05/2012   Procedure: RIGHT TOTAL KNEE ARTHROPLASTY;  Surgeon: Mauri Pole, MD;  Location: [facility];  Service: Orthopedics;  Laterality: Right;    Family History  Problem Relation Age of Onset   Heart attack Mother    Asthma Mother    Heart disease Mother    Hyperlipidemia Mother    Allergic rhinitis Mother    Heart attack Father    Heart failure Father    Deep vein thrombosis Father    Heart disease Father    Peripheral vascular disease Father        amputation   Asthma Brother    Allergic rhinitis Brother    Asthma Sister    Eczema Sister    Allergic rhinitis Sister    Cancer Brother    Asthma Brother    Coronary artery disease Sister    Heart disease Sister    Asthma Maternal Grandmother    Urticaria Neg Hx     Social History:   reports that [name] has never smoked. [name] has never used smokeless tobacco. [name] reports that [name] does not drink alcohol and does not use drugs.  Allergies:  Allergies  Allergen Reactions   Codeine Nausea And Vomiting   Egg Phospholipids    Eggs Or Egg-Derived Products     UNSPECIFIED REACTION  RAW EGGS.. Can eat cooked eggs   Influenza Vaccines     UNSPECIFIED REACTION  Egg allergy    Nucala [Mepolizumab]     UNSPECIFIED REACTION    Cephalosporins Rash   Gabapentin Nausea Only   Levofloxacin Rash   Pneumococcal Vaccines Rash    Medications: I have reviewed the patient's current medications.  Results for orders placed or performed during the hospital encounter of 03/31/21 (from the past 48 hour(s))  Basic metabolic panel     Status: Abnormal   Collection Time: 03/31/21  2:43 PM  Result Value Ref Range   Sodium 136 135 - 145 mmol/L   Potassium 3.8 3.5 - 5.1 mmol/L   Chloride 98 98 - 111 mmol/L   CO2 28 22 - 32 mmol/L   Glucose, Bld 164 (H) 70 - 99 mg/dL    Comment: Glucose reference range applies only to samples taken after fasting for at least 8 hours.   BUN 20 8 - 23 mg/dL   Creatinine, Ser 1.49 (H) 0.44 - 1.00 mg/dL   Calcium 8.8 (L) 8.9 - 10.3 mg/dL   GFR, Estimated 34 (L) >60 mL/min    Comment: (NOTE) Calculated using the CKD-EPI Creatinine Equation (2021)    Anion gap 10 5 - 15    Comment: Performed at [facility], [address] [place], [place] [address]  CBC with Differential     Status: Abnormal   Collection Time: 03/31/21  2:43 PM  Result Value Ref Range   WBC 9.2 4.0 - 10.5 K/uL   RBC 4.07 3.87 - 5.11 MIL/uL   Hemoglobin 12.0 12.0 - 15.0 g/dL   HCT 37.8 36.0 - 46.0 %   MCV 92.9 80.0 - 100.0 fL   MCH 29.5 26.0 - 34.0 pg   MCHC 31.7 30.0 - 36.0 g/dL   RDW 12.8 11.5 - 15.5 %   Platelets 248 150 - 400 K/uL   nRBC 0.0 0.0 - 0.2 %   Neutrophils Relative % 78 %   Neutro Abs 7.2 1.7 - 7.7 K/uL   Lymphocytes Relative 14 %  Lymphs Abs 1.3 0.7 - 4.0 K/uL    Monocytes Relative 6 %   Monocytes Absolute 0.5 0.1 - 1.0 K/uL   Eosinophils Relative 1 %   Eosinophils Absolute 0.1 0.0 - 0.5 K/uL   Basophils Relative 0 %   Basophils Absolute 0.0 0.0 - 0.1 K/uL   Immature Granulocytes 1 %   Abs Immature Granulocytes 0.08 (H) 0.00 - 0.07 K/uL    Comment: Performed at [facility], [address], [place], [place] [address]  Resp Panel by RT-PCR (Flu A&B, Covid) Nasopharyngeal Swab     Status: None   Collection Time: 03/31/21  4:44 PM   Specimen: Nasopharyngeal Swab; Nasopharyngeal(NP) swabs in vial transport medium  Result Value Ref Range   SARS Coronavirus 2 by RT PCR NEGATIVE NEGATIVE    Comment: (NOTE) SARS-CoV-2 target nucleic acids are NOT DETECTED.  The SARS-CoV-2 RNA is generally detectable in upper respiratory specimens during the acute phase of infection. The lowest concentration of SARS-CoV-2 viral copies this assay can detect is 138 copies/mL. A negative result does not preclude SARS-Cov-2 infection and should not be used as the sole basis for treatment or other patient management decisions. A negative result may occur with  improper specimen collection/handling, submission of specimen other than nasopharyngeal swab, presence of viral mutation(s) within the areas targeted by this assay, and inadequate number of viral copies(<138 copies/mL). A negative result must be combined with clinical observations, patient history, and epidemiological information. The expected result is Negative.  Fact Sheet for Patients:  EntrepreneurPulse.com.au  Fact Sheet for Healthcare Providers:  IncredibleEmployment.be  This test is no t yet approved or cleared by the [place] FDA and  has been authorized for detection and/or diagnosis of SARS-CoV-2 by FDA under an Emergency Use Authorization (EUA). This EUA will remain  in effect (meaning this test can be used) for the duration of the COVID-19 declaration under  Section 564(b)(1) of the Act, 21 U.S.C.section 360bbb-3(b)(1), unless the authorization is terminated  or revoked sooner.       Influenza A by PCR NEGATIVE NEGATIVE   Influenza B by PCR NEGATIVE NEGATIVE    Comment: (NOTE) The Xpert Xpress SARS-CoV-2/FLU/RSV plus assay is intended as an aid in the diagnosis of influenza from Nasopharyngeal swab specimens and should not be used as a sole basis for treatment. Nasal washings and aspirates are unacceptable for Xpert Xpress SARS-CoV-2/FLU/RSV testing.  Fact Sheet for Patients: EntrepreneurPulse.com.au  Fact Sheet for Healthcare Providers: IncredibleEmployment.be  This test is not yet approved or cleared by the [place] FDA and has been authorized for detection and/or diagnosis of SARS-CoV-2 by FDA under an Emergency Use Authorization (EUA). This EUA will remain in effect (meaning this test can be used) for the duration of the COVID-19 declaration under Section 564(b)(1) of the Act, 21 U.S.C. section 360bbb-3(b)(1), unless the authorization is terminated or revoked.  Performed at [facility], [address] [place],  [address]   Basic metabolic panel     Status: Abnormal   Collection Time: 04/01/21  2:09 AM  Result Value Ref Range   Sodium 135 135 - 145 mmol/L   Potassium 4.4 3.5 - 5.1 mmol/L   Chloride 98 98 - 111 mmol/L   CO2 29 22 - 32 mmol/L   Glucose, Bld 137 (H) 70 - 99 mg/dL    Comment: Glucose reference range applies only to samples taken after fasting for at least 8 hours.   BUN 19 8 - 23 mg/dL   Creatinine,  Ser 1.33 (H) 0.44 - 1.00 mg/dL   Calcium 8.6 (L) 8.9 - 10.3 mg/dL   GFR, Estimated 39 (L) >60 mL/min    Comment: (NOTE) Calculated using the CKD-EPI Creatinine Equation (2021)    Anion gap 8 5 - 15    Comment: Performed at [facility] [address], [place], [place] [address]  CBC     Status: Abnormal   Collection Time: 04/01/21  2:09 AM  Result Value  Ref Range   WBC 5.4 4.0 - 10.5 K/uL   RBC 3.52 (L) 3.87 - 5.11 MIL/uL   Hemoglobin 10.4 (L) 12.0 - 15.0 g/dL   HCT 32.6 (L) 36.0 - 46.0 %   MCV 92.6 80.0 - 100.0 fL   MCH 29.5 26.0 - 34.0 pg   MCHC 31.9 30.0 - 36.0 g/dL   RDW 12.9 11.5 - 15.5 %   Platelets 179 150 - 400 K/uL   nRBC 0.0 0.0 - 0.2 %    Comment: Performed at [facility], [place] [address] [place], [place] [address]  Protime-INR     Status: None   Collection Time: 04/01/21  2:09 AM  Result Value Ref Range   Prothrombin Time 14.3 11.4 - 15.2 seconds   INR 1.1 0.8 - 1.2    Comment: (NOTE) INR goal varies based on device and disease states. Performed at [facility], [place] [address] [place], [place] [address]     DG Shoulder Right  Result Date: 03/31/2021 CLINICAL DATA:  [place], right shoulder pain EXAM: RIGHT SHOULDER - 2+ VIEW COMPARISON:  09/07/2018 FINDINGS: Frontal and transscapular views of the right shoulder are obtained. Prior healed fracture of the greater tuberosity of the proximal right humerus. No acute fracture, subluxation, or dislocation. Moderate spurring of the acromioclavicular joint. Marked narrowing of the acromial humeral interval consistent with chronic longstanding rotator cuff tear. Visualized portions of the right chest are clear. IMPRESSION: 1. Chronic posttraumatic and degenerative changes. No acute bony abnormality. Electronically Signed   By: Randa Ngo M.D.   On: 03/31/2021 16:02   CT Head Wo Contrast  Result Date: 03/31/2021 CLINICAL DATA:  Fall, pain EXAM: CT HEAD WITHOUT CONTRAST CT CERVICAL SPINE WITHOUT CONTRAST TECHNIQUE: Multidetector CT imaging of the head and cervical spine was performed following the standard protocol without intravenous contrast. Multiplanar CT image reconstructions of the cervical spine were also generated. COMPARISON:  None. FINDINGS: CT HEAD FINDINGS Brain: No evidence of acute infarction, hemorrhage, hydrocephalus, extra-axial collection or mass  lesion/mass effect. Vascular: No hyperdense vessel or unexpected calcification. Skull: Normal. Negative for fracture or focal lesion. Sinuses/Orbits: No acute finding. Other: Soft tissue contusion of the right parietal scalp (series 2, image 14). CT CERVICAL SPINE FINDINGS Alignment: Degenerative straightening and reversal of the normal cervical lordosis. Skull base and vertebrae: No acute fracture. No primary bone lesion or focal pathologic process. Soft tissues and spinal canal: No prevertebral fluid or swelling. No visible canal hematoma. Disc levels: Moderate multilevel disc degenerative disease and osteophytosis of C4 through C7 with otherwise preserved disc spaces. Upper chest: Negative. Other: None. IMPRESSION: 1. No acute intracranial pathology. 2. Soft tissue contusion of the right parietal scalp. 3. No fracture or static subluxation of the cervical spine. 4. Moderate multilevel disc degenerative disease and osteophytosis of C4 through C7. Electronically Signed   By: Delanna Ahmadi M.D.   On: 03/31/2021 15:27   CT Cervical Spine Wo Contrast  Result Date: 03/31/2021 CLINICAL DATA:  Fall, pain EXAM:  CT HEAD WITHOUT CONTRAST CT CERVICAL SPINE WITHOUT CONTRAST TECHNIQUE: Multidetector CT imaging of the head and cervical spine was performed following the standard protocol without intravenous contrast. Multiplanar CT image reconstructions of the cervical spine were also generated. COMPARISON:  None. FINDINGS: CT HEAD FINDINGS Brain: No evidence of acute infarction, hemorrhage, hydrocephalus, extra-axial collection or mass lesion/mass effect. Vascular: No hyperdense vessel or unexpected calcification. Skull: Normal. Negative for fracture or focal lesion. Sinuses/Orbits: No acute finding. Other: Soft tissue contusion of the right parietal scalp (series 2, image 14). CT CERVICAL SPINE FINDINGS Alignment: Degenerative straightening and reversal of the normal cervical lordosis. Skull base and vertebrae: No acute  fracture. No primary bone lesion or focal pathologic process. Soft tissues and spinal canal: No prevertebral fluid or swelling. No visible canal hematoma. Disc levels: Moderate multilevel disc degenerative disease and osteophytosis of C4 through C7 with otherwise preserved disc spaces. Upper chest: Negative. Other: None. IMPRESSION: 1. No acute intracranial pathology. 2. Soft tissue contusion of the right parietal scalp. 3. No fracture or static subluxation of the cervical spine. 4. Moderate multilevel disc degenerative disease and osteophytosis of C4 through C7. Electronically Signed   By: Delanna Ahmadi M.D.   On: 03/31/2021 15:27   DG Hip Unilat With Pelvis 2-3 Views Right  Result Date: 03/31/2021 CLINICAL DATA:  [place], right hip pain and EXAM: DG HIP (WITH OR WITHOUT PELVIS) 2-3V RIGHT COMPARISON:  09/07/2018 FINDINGS: Frontal view of the pelvis as well as frontal and cross-table lateral views of the right hip are obtained. There is a subcapital right femoral neck fracture with mild impaction and valgus angulation. No dislocation. No other acute displaced fractures. Unremarkable left hip arthroplasty. IMPRESSION: 1. Subcapital right femoral neck fracture with mild impaction and valgus angulation. Electronically Signed   By: Randa Ngo M.D.   On: 03/31/2021 16:00    Review of Systems  HENT:  Negative for ear discharge, ear pain, hearing loss and tinnitus.   Eyes:  Negative for photophobia and pain.  Respiratory:  Negative for cough and shortness of breath.   Cardiovascular:  Negative for chest pain.  Gastrointestinal:  Negative for abdominal pain, nausea and vomiting.  Genitourinary:  Negative for dysuria, flank pain, frequency and urgency.  Musculoskeletal:  Positive for arthralgias (Right hip). Negative for back pain, myalgias and neck pain.  Neurological:  Negative for dizziness and headaches.  Hematological:  Does not bruise/bleed easily.  Psychiatric/Behavioral:  The patient is not  nervous/anxious.   Blood pressure (!) 141/51, pulse 76, temperature 97.7 F (36.5 C), temperature source Oral, resp. rate 19, height 5\' 5"  (1.651 m), weight 63.5 kg, SpO2 96 %. Physical Exam Constitutional:      General: [name] is not in acute distress.    Appearance: [name] is well-developed. [name] is not diaphoretic.  HENT:     Head: Normocephalic and atraumatic.  Eyes:     General: No scleral icterus.       Right eye: No discharge.        Left eye: No discharge.     Conjunctiva/sclera: Conjunctivae normal.  Cardiovascular:     Rate and Rhythm: Normal rate and regular rhythm.  Pulmonary:     Effort: Pulmonary effort is normal. No respiratory distress.  Musculoskeletal:     Cervical back: Normal range of motion.     Comments: RLE No traumatic wounds, ecchymosis, or rash  Mild TTP hip  No knee or ankle effusion  Knee stable to varus/ valgus and anterior/posterior stress  Sens  DPN, SPN, TN intact  Motor EHL, ext, flex, evers 5/5  DP 2+, PT 2+, No significant edema  Skin:    General: Skin is warm and dry.  Neurological:     Mental Status: [name] is alert.  Psychiatric:        Mood and Affect: Mood normal.        Behavior: Behavior normal.    Assessment/Plan: Right hip fx -- Pt requests Dr. Alvan Dame for repair. Will ask hospitalist to transfer to [facility] where he will address. Will make NPO after MN in case he can get to it then. Please continue to hold blood thinners.    Lisette Abu, PA-C Orthopedic Surgery [phone] 04/01/2021, 10:18 AM

## 2021-04-01 NOTE — Progress Notes (Addendum)
PROGRESS NOTE    [name]  JAS:[id number] DOB: 06-27-1932 DOA: 03/31/2021 PCP: Crist Infante, MD     Brief Narrative:  [name] is a 87 y.o. female with a history of DVT on chronic anticoagulation (Xarelto), hyperlipidemia, hypertension, history of breast cancer, stage II chronic kidney disease, asthma, rheumatoid arthritis.  Patient seen after a fall at church.  Her foot got caught on a step and she fell onto her right side and had immediate pain.  She has been unable to bear weight on that side.  Pain worse with movement and improved with rest.  She was brought here by EMS. Hip x-ray shows right femoral neck fracture.  Head CT unremarkable.  New events last 24 hours / Subjective: Patient continues to have some pain in her right hip.  States that she is intolerant to many pain medications.  Last dose Xarelto was Saturday night.  Assessment & Plan:   Principal Problem:   Femur fracture, right (HCC) Active Problems:   GERD   History of DVT of lower extremity   CKD (chronic kidney disease) stage 3, GFR 30-59 ml/min (HCC)   CAD (coronary artery disease), native coronary artery   Chronic diastolic CHF (congestive heart failure) (HCC)   Essential hypertension  Right hip fracture after mechanical fall -Orthopedic surgery planning for surgical intervention 11/22 -Continue to hold Xarelto -Pain control -PT OT postoperatively  Revised cardiac risk index for preoperative risk class II risk with 6% 30-day risk of death, MI or cardiac arrest due to history of congestive heart failure.  Patient was last seen by cardiology in June 2022, at that time patient had had some chest pains but Lexiscan Myoview in February 2022 was deemed a low risk study.  During this evaluation, atypical chest pain was thought to be secondary to coronary spasm versus musculoskeletal versus anxiety.  Chronic diastolic heart failure -Stable, no sign of volume overload on exam   History of DVT -Continue to  hold Xarelto  CKD stage IIIb -Stable  Hypertension -Continue Avapro  Hyperlipidemia -Continue Crestor    DVT prophylaxis:  SCDs Start: 03/31/21 2320  Code Status: Full code Family Communication: No family at bedside Disposition Plan:  Status is: Inpatient  Remains inpatient appropriate because: Planning for surgery for her right hip fracture 11/22    Antimicrobials:  Anti-infectives (From admission, onward)    None        Objective: Vitals:   04/01/21 0405 04/01/21 0830 04/01/21 0848 04/01/21 0849  BP: (!) 123/47   (!) 141/51  Pulse: 72 74  76  Resp: 16 16  19   Temp: 98.6 F (37 C)  97.7 F (36.5 C)   TempSrc: Oral  Oral   SpO2: 100% 97%  96%  Weight:      Height:       No intake or output data in the 24 hours ending 04/01/21 1031 Filed Weights   03/31/21 1424  Weight: 63.5 kg    Examination:  General exam: Appears calm and comfortable  Respiratory system: Clear to auscultation. Respiratory effort normal. No respiratory distress. No conversational dyspnea.  Cardiovascular system: S1 & S2 heard, RRR. No murmurs. No pedal edema. Gastrointestinal system: Abdomen is nondistended, soft and nontender. Normal bowel sounds heard. Central nervous system: Alert and oriented. No focal neurological deficits. Speech clear.  Skin: No rashes, lesions or ulcers on exposed skin  Psychiatry: Judgement and insight appear normal. Mood & affect appropriate.   Data Reviewed: I have personally reviewed following labs and  imaging studies  CBC: Recent Labs  Lab 03/31/21 1443 04/01/21 0209  WBC 9.2 5.4  NEUTROABS 7.2  --   HGB 12.0 10.4*  HCT 37.8 32.6*  MCV 92.9 92.6  PLT 248 833   Basic Metabolic Panel: Recent Labs  Lab 03/31/21 1443 04/01/21 0209  NA 136 135  K 3.8 4.4  CL 98 98  CO2 28 29  GLUCOSE 164* 137*  BUN 20 19  CREATININE 1.49* 1.33*  CALCIUM 8.8* 8.6*   GFR: Estimated Creatinine Clearance: 27.3 mL/min (A) (by C-G formula based on SCr of  1.33 mg/dL (H)). Liver Function Tests: No results for input(s): AST, ALT, ALKPHOS, BILITOT, PROT, ALBUMIN in the last 168 hours. No results for input(s): LIPASE, AMYLASE in the last 168 hours. No results for input(s): AMMONIA in the last 168 hours. Coagulation Profile: Recent Labs  Lab 04/01/21 0209  INR 1.1   Cardiac Enzymes: No results for input(s): CKTOTAL, CKMB, CKMBINDEX, TROPONINI in the last 168 hours. BNP (last 3 results) No results for input(s): PROBNP in the last 8760 hours. HbA1C: No results for input(s): HGBA1C in the last 72 hours. CBG: No results for input(s): GLUCAP in the last 168 hours. Lipid Profile: No results for input(s): CHOL, HDL, LDLCALC, TRIG, CHOLHDL, LDLDIRECT in the last 72 hours. Thyroid Function Tests: No results for input(s): TSH, T4TOTAL, FREET4, T3FREE, THYROIDAB in the last 72 hours. Anemia Panel: No results for input(s): VITAMINB12, FOLATE, FERRITIN, TIBC, IRON, RETICCTPCT in the last 72 hours. Sepsis Labs: No results for input(s): PROCALCITON, LATICACIDVEN in the last 168 hours.  Recent Results (from the past 240 hour(s))  Resp Panel by RT-PCR (Flu A&B, Covid) Nasopharyngeal Swab     Status: None   Collection Time: 03/31/21  4:44 PM   Specimen: Nasopharyngeal Swab; Nasopharyngeal(NP) swabs in vial transport medium  Result Value Ref Range Status   SARS Coronavirus 2 by RT PCR NEGATIVE NEGATIVE Final    Comment: (NOTE) SARS-CoV-2 target nucleic acids are NOT DETECTED.  The SARS-CoV-2 RNA is generally detectable in upper respiratory specimens during the acute phase of infection. The lowest concentration of SARS-CoV-2 viral copies this assay can detect is 138 copies/mL. A negative result does not preclude SARS-Cov-2 infection and should not be used as the sole basis for treatment or other patient management decisions. A negative result may occur with  improper specimen collection/handling, submission of specimen other than nasopharyngeal  swab, presence of viral mutation(s) within the areas targeted by this assay, and inadequate number of viral copies(<138 copies/mL). A negative result must be combined with clinical observations, patient history, and epidemiological information. The expected result is Negative.  Fact Sheet for Patients:  EntrepreneurPulse.com.au  Fact Sheet for Healthcare Providers:  IncredibleEmployment.be  This test is no t yet approved or cleared by the [place] FDA and  has been authorized for detection and/or diagnosis of SARS-CoV-2 by FDA under an Emergency Use Authorization (EUA). This EUA will remain  in effect (meaning this test can be used) for the duration of the COVID-19 declaration under Section 564(b)(1) of the Act, 21 U.S.C.section 360bbb-3(b)(1), unless the authorization is terminated  or revoked sooner.       Influenza A by PCR NEGATIVE NEGATIVE Final   Influenza B by PCR NEGATIVE NEGATIVE Final    Comment: (NOTE) The Xpert Xpress SARS-CoV-2/FLU/RSV plus assay is intended as an aid in the diagnosis of influenza from Nasopharyngeal swab specimens and should not be used as a sole basis for treatment. Nasal washings and  aspirates are unacceptable for Xpert Xpress SARS-CoV-2/FLU/RSV testing.  Fact Sheet for Patients: EntrepreneurPulse.com.au  Fact Sheet for Healthcare Providers: IncredibleEmployment.be  This test is not yet approved or cleared by the [place] FDA and has been authorized for detection and/or diagnosis of SARS-CoV-2 by FDA under an Emergency Use Authorization (EUA). This EUA will remain in effect (meaning this test can be used) for the duration of the COVID-19 declaration under Section 564(b)(1) of the Act, 21 U.S.C. section 360bbb-3(b)(1), unless the authorization is terminated or revoked.  Performed at [facility], [address], [place], [place] [address]       Radiology  Studies: DG Shoulder Right  Result Date: 03/31/2021 CLINICAL DATA:  [place], right shoulder pain EXAM: RIGHT SHOULDER - 2+ VIEW COMPARISON:  09/07/2018 FINDINGS: Frontal and transscapular views of the right shoulder are obtained. Prior healed fracture of the greater tuberosity of the proximal right humerus. No acute fracture, subluxation, or dislocation. Moderate spurring of the acromioclavicular joint. Marked narrowing of the acromial humeral interval consistent with chronic longstanding rotator cuff tear. Visualized portions of the right chest are clear. IMPRESSION: 1. Chronic posttraumatic and degenerative changes. No acute bony abnormality. Electronically Signed   By: Randa Ngo M.D.   On: 03/31/2021 16:02   CT Head Wo Contrast  Result Date: 03/31/2021 CLINICAL DATA:  Fall, pain EXAM: CT HEAD WITHOUT CONTRAST CT CERVICAL SPINE WITHOUT CONTRAST TECHNIQUE: Multidetector CT imaging of the head and cervical spine was performed following the standard protocol without intravenous contrast. Multiplanar CT image reconstructions of the cervical spine were also generated. COMPARISON:  None. FINDINGS: CT HEAD FINDINGS Brain: No evidence of acute infarction, hemorrhage, hydrocephalus, extra-axial collection or mass lesion/mass effect. Vascular: No hyperdense vessel or unexpected calcification. Skull: Normal. Negative for fracture or focal lesion. Sinuses/Orbits: No acute finding. Other: Soft tissue contusion of the right parietal scalp (series 2, image 14). CT CERVICAL SPINE FINDINGS Alignment: Degenerative straightening and reversal of the normal cervical lordosis. Skull base and vertebrae: No acute fracture. No primary bone lesion or focal pathologic process. Soft tissues and spinal canal: No prevertebral fluid or swelling. No visible canal hematoma. Disc levels: Moderate multilevel disc degenerative disease and osteophytosis of C4 through C7 with otherwise preserved disc spaces. Upper chest: Negative. Other:  None. IMPRESSION: 1. No acute intracranial pathology. 2. Soft tissue contusion of the right parietal scalp. 3. No fracture or static subluxation of the cervical spine. 4. Moderate multilevel disc degenerative disease and osteophytosis of C4 through C7. Electronically Signed   By: Delanna Ahmadi M.D.   On: 03/31/2021 15:27   CT Cervical Spine Wo Contrast  Result Date: 03/31/2021 CLINICAL DATA:  Fall, pain EXAM: CT HEAD WITHOUT CONTRAST CT CERVICAL SPINE WITHOUT CONTRAST TECHNIQUE: Multidetector CT imaging of the head and cervical spine was performed following the standard protocol without intravenous contrast. Multiplanar CT image reconstructions of the cervical spine were also generated. COMPARISON:  None. FINDINGS: CT HEAD FINDINGS Brain: No evidence of acute infarction, hemorrhage, hydrocephalus, extra-axial collection or mass lesion/mass effect. Vascular: No hyperdense vessel or unexpected calcification. Skull: Normal. Negative for fracture or focal lesion. Sinuses/Orbits: No acute finding. Other: Soft tissue contusion of the right parietal scalp (series 2, image 14). CT CERVICAL SPINE FINDINGS Alignment: Degenerative straightening and reversal of the normal cervical lordosis. Skull base and vertebrae: No acute fracture. No primary bone lesion or focal pathologic process. Soft tissues and spinal canal: No prevertebral fluid or swelling. No visible canal hematoma. Disc levels: Moderate multilevel disc degenerative disease  and osteophytosis of C4 through C7 with otherwise preserved disc spaces. Upper chest: Negative. Other: None. IMPRESSION: 1. No acute intracranial pathology. 2. Soft tissue contusion of the right parietal scalp. 3. No fracture or static subluxation of the cervical spine. 4. Moderate multilevel disc degenerative disease and osteophytosis of C4 through C7. Electronically Signed   By: Delanna Ahmadi M.D.   On: 03/31/2021 15:27   DG Hip Unilat With Pelvis 2-3 Views Right  Result Date:  03/31/2021 CLINICAL DATA:  [place], right hip pain and EXAM: DG HIP (WITH OR WITHOUT PELVIS) 2-3V RIGHT COMPARISON:  09/07/2018 FINDINGS: Frontal view of the pelvis as well as frontal and cross-table lateral views of the right hip are obtained. There is a subcapital right femoral neck fracture with mild impaction and valgus angulation. No dislocation. No other acute displaced fractures. Unremarkable left hip arthroplasty. IMPRESSION: 1. Subcapital right femoral neck fracture with mild impaction and valgus angulation. Electronically Signed   By: Randa Ngo M.D.   On: 03/31/2021 16:00      Scheduled Meds:  fluticasone  1 spray Each Nare Daily   fluticasone furoate-vilanterol  1 puff Inhalation Daily   irbesartan  75 mg Oral Daily   loratadine  10 mg Oral Daily   montelukast  10 mg Oral QHS   pantoprazole  40 mg Oral Daily   pramipexole  0.375 mg Oral QHS   rosuvastatin  5 mg Oral QHS   senna  1 tablet Oral BID   Continuous Infusions:   LOS: 1 day      Time spent: 25 minutes   Dessa Phi, DO [facility] 04/01/2021, 10:31 AM   Available via Epic secure chat 7am-7pm After these hours, please refer to coverage provider listed on [email or web address]

## 2021-04-01 NOTE — Progress Notes (Signed)
Patient for transfer to [facility] as ordered. Reported to receiving nurse Leda Gauze RN, awaiting [facility].

## 2021-04-01 NOTE — Progress Notes (Signed)
Oxycodone was ordered for pain but pt states she cannot take oxycodone because it makes her really sick. Provider paged. See orders.

## 2021-04-01 NOTE — TOC CAGE-AID Note (Signed)
Transition of Care [facility]) - CAGE-AID Screening   Patient Details  Name: [name] MRN: [id number] Date of Birth: May 14, 1932  [facility]) CM/SW Contact:    Franck Vinal C Tarpley-Carter, [place] Phone Number: 04/01/2021, 9:57 AM   Clinical Narrative: Pt participated in [place].  Pt stated she does not use substance or ETOH.  Pt was not offered resources, due to no usage of substance or ETOH.   Obdulia Steier Tarpley-Carter, MSW, LCSW-A Pronouns:  She/Her/Hers [facility] Clinical Social Worker Direct Number:  [phone] [email or web address]     CAGE-AID Screening:    Have You Ever Felt You Ought to Cut Down on Your Drinking or Drug Use?: No Have People Annoyed You By [employer] Your Drinking Or Drug Use?: No Have You Felt Bad Or Guilty About Your Drinking Or Drug Use?: No Have You Ever Had a Drink or Used Drugs First Thing In The Morning to Steady Your Nerves or to Get Rid of a Hangover?: No CAGE-AID Score: 0  Substance Abuse Education Offered: No

## 2021-04-01 NOTE — Progress Notes (Signed)
Transferred to [employer].

## 2021-04-01 NOTE — Progress Notes (Signed)
Tramadol last given at 2349. Reassessed pt's pain, pt now reporting 10/10 pain. Provider paged, see new orders.

## 2021-04-02 ENCOUNTER — Inpatient Hospital Stay (HOSPITAL_COMMUNITY): Payer: Medicare Other | Admitting: Certified Registered Nurse Anesthetist

## 2021-04-02 ENCOUNTER — Encounter (HOSPITAL_COMMUNITY): Payer: Self-pay | Admitting: *Deleted

## 2021-04-02 ENCOUNTER — Encounter (HOSPITAL_COMMUNITY)
Admission: EM | Disposition: A | Discharge status: Home Health | Payer: Self-pay | Source: Home / Self Care | Attending: Internal Medicine

## 2021-04-02 ENCOUNTER — Inpatient Hospital Stay (HOSPITAL_COMMUNITY): Payer: Medicare Other

## 2021-04-02 DIAGNOSIS — N183 Chronic kidney disease, stage 3 unspecified: Secondary | ICD-10-CM | POA: Diagnosis not present

## 2021-04-02 DIAGNOSIS — S72044A Nondisplaced fracture of base of neck of right femur, initial encounter for closed fracture: Secondary | ICD-10-CM | POA: Diagnosis not present

## 2021-04-02 DIAGNOSIS — I2510 Atherosclerotic heart disease of native coronary artery without angina pectoris: Secondary | ICD-10-CM | POA: Diagnosis not present

## 2021-04-02 DIAGNOSIS — I10 Essential (primary) hypertension: Secondary | ICD-10-CM | POA: Diagnosis not present

## 2021-04-02 HISTORY — PX: TOTAL HIP ARTHROPLASTY: SHX124

## 2021-04-02 LAB — TYPE AND SCREEN
ABO/RH(D): O POS
Antibody Screen: NEGATIVE

## 2021-04-02 SURGERY — ARTHROPLASTY, HIP, TOTAL, ANTERIOR APPROACH
Anesthesia: Spinal | Site: Hip | Laterality: Right

## 2021-04-02 MED ORDER — PHENYLEPHRINE 40 MCG/ML (10ML) SYRINGE FOR IV PUSH (FOR BLOOD PRESSURE SUPPORT)
PREFILLED_SYRINGE | INTRAVENOUS | Status: DC | PRN
  Administered 2021-04-02 (×3): 120 ug via INTRAVENOUS

## 2021-04-02 MED ORDER — MORPHINE SULFATE (PF) 2 MG/ML IV SOLN: 0.5000 mg | INTRAVENOUS | Status: DC | PRN

## 2021-04-02 MED ORDER — FERROUS SULFATE 325 (65 FE) MG PO TABS
325.0000 mg | ORAL_TABLET | Freq: Three times a day (TID) | ORAL | Status: DC
  Administered 2021-04-03 – 2021-04-08 (×13): 325 mg via ORAL
  Filled 2021-04-02 (×13): qty 1

## 2021-04-02 MED ORDER — LIDOCAINE 2% (20 MG/ML) 5 ML SYRINGE
INTRAMUSCULAR | Status: DC | PRN
  Administered 2021-04-02: 100 mg via INTRAVENOUS

## 2021-04-02 MED ORDER — FENTANYL CITRATE (PF) 100 MCG/2ML IJ SOLN
INTRAMUSCULAR | Status: DC | PRN
  Administered 2021-04-02: 25 ug via INTRAVENOUS
  Administered 2021-04-02: 50 ug via INTRAVENOUS
  Administered 2021-04-02: 25 ug via INTRAVENOUS

## 2021-04-02 MED ORDER — METHOCARBAMOL 500 MG IVPB - SIMPLE MED
500.0000 mg | Freq: Four times a day (QID) | INTRAVENOUS | Status: DC | PRN
  Administered 2021-04-02: 500 mg via INTRAVENOUS
  Filled 2021-04-02: qty 50

## 2021-04-02 MED ORDER — METHOCARBAMOL 500 MG PO TABS
500.0000 mg | ORAL_TABLET | Freq: Four times a day (QID) | ORAL | Status: DC | PRN
  Administered 2021-04-03 – 2021-04-07 (×9): 500 mg via ORAL
  Filled 2021-04-02 (×10): qty 1

## 2021-04-02 MED ORDER — SALINE SPRAY 0.65 % NA SOLN
1.0000 | NASAL | Status: DC | PRN
  Filled 2021-04-02: qty 44

## 2021-04-02 MED ORDER — DEXAMETHASONE SODIUM PHOSPHATE 10 MG/ML IJ SOLN
INTRAMUSCULAR | Status: DC | PRN
  Administered 2021-04-02: 8 mg via INTRAVENOUS

## 2021-04-02 MED ORDER — CHLORHEXIDINE GLUCONATE 0.12 % MT SOLN: 15.0000 mL | Freq: Once | OROMUCOSAL | Status: AC

## 2021-04-02 MED ORDER — MENTHOL 3 MG MT LOZG
1.0000 | LOZENGE | OROMUCOSAL | Status: DC | PRN
  Administered 2021-04-05: 3 mg via ORAL
  Filled 2021-04-02 (×2): qty 9

## 2021-04-02 MED ORDER — CEFAZOLIN SODIUM-DEXTROSE 2-4 GM/100ML-% IV SOLN
2.0000 g | INTRAVENOUS | Status: AC
  Administered 2021-04-02: 2 g via INTRAVENOUS
  Filled 2021-04-02: qty 100

## 2021-04-02 MED ORDER — HYDROCODONE-ACETAMINOPHEN 5-325 MG PO TABS
1.0000 | ORAL_TABLET | ORAL | Status: DC | PRN
  Administered 2021-04-02 – 2021-04-03 (×2): 2 via ORAL
  Administered 2021-04-04 – 2021-04-05 (×6): 1 via ORAL
  Administered 2021-04-05 (×2): 2 via ORAL
  Administered 2021-04-06: 1 via ORAL
  Filled 2021-04-02 (×3): qty 1
  Filled 2021-04-02: qty 2
  Filled 2021-04-02: qty 1
  Filled 2021-04-02: qty 2
  Filled 2021-04-02: qty 1
  Filled 2021-04-02: qty 2
  Filled 2021-04-02 (×2): qty 1
  Filled 2021-04-02: qty 2

## 2021-04-02 MED ORDER — PHENYLEPHRINE HCL-NACL 20-0.9 MG/250ML-% IV SOLN
INTRAVENOUS | Status: DC | PRN
  Administered 2021-04-02: 35 ug/min via INTRAVENOUS

## 2021-04-02 MED ORDER — CHLORHEXIDINE GLUCONATE 4 % EX LIQD: 60.0000 mL | Freq: Once | CUTANEOUS | Status: DC

## 2021-04-02 MED ORDER — FENTANYL CITRATE PF 50 MCG/ML IJ SOSY
PREFILLED_SYRINGE | INTRAMUSCULAR | Status: AC
  Filled 2021-04-02: qty 2

## 2021-04-02 MED ORDER — ACETAMINOPHEN 325 MG PO TABS
325.0000 mg | ORAL_TABLET | Freq: Four times a day (QID) | ORAL | Status: DC | PRN
  Administered 2021-04-03 – 2021-04-04 (×3): 650 mg via ORAL
  Administered 2021-04-05: 325 mg via ORAL
  Administered 2021-04-06 – 2021-04-07 (×2): 650 mg via ORAL
  Filled 2021-04-02 (×6): qty 2

## 2021-04-02 MED ORDER — STERILE WATER FOR IRRIGATION IR SOLN
Status: DC | PRN
  Administered 2021-04-02: 2000 mL

## 2021-04-02 MED ORDER — PROPOFOL 10 MG/ML IV BOLUS
INTRAVENOUS | Status: DC | PRN
  Administered 2021-04-02: 120 mg via INTRAVENOUS

## 2021-04-02 MED ORDER — ROCURONIUM BROMIDE 10 MG/ML (PF) SYRINGE
PREFILLED_SYRINGE | INTRAVENOUS | Status: DC | PRN
  Administered 2021-04-02: 10 mg via INTRAVENOUS
  Administered 2021-04-02: 40 mg via INTRAVENOUS

## 2021-04-02 MED ORDER — METOCLOPRAMIDE HCL 5 MG PO TABS
5.0000 mg | ORAL_TABLET | Freq: Three times a day (TID) | ORAL | Status: DC | PRN
  Administered 2021-04-04 – 2021-04-07 (×2): 10 mg via ORAL
  Filled 2021-04-02 (×2): qty 2

## 2021-04-02 MED ORDER — CEFAZOLIN SODIUM-DEXTROSE 2-4 GM/100ML-% IV SOLN
2.0000 g | Freq: Four times a day (QID) | INTRAVENOUS | Status: AC
  Administered 2021-04-02 – 2021-04-03 (×2): 2 g via INTRAVENOUS
  Filled 2021-04-02 (×2): qty 100

## 2021-04-02 MED ORDER — ONDANSETRON HCL 4 MG/2ML IJ SOLN
4.0000 mg | Freq: Four times a day (QID) | INTRAMUSCULAR | Status: DC | PRN
  Administered 2021-04-03: 4 mg via INTRAVENOUS
  Filled 2021-04-02: qty 2

## 2021-04-02 MED ORDER — ONDANSETRON HCL 4 MG/2ML IJ SOLN: 4.0000 mg | Freq: Once | INTRAMUSCULAR | Status: DC | PRN

## 2021-04-02 MED ORDER — METHOCARBAMOL 500 MG IVPB - SIMPLE MED
INTRAVENOUS | Status: AC
  Filled 2021-04-02: qty 50

## 2021-04-02 MED ORDER — ONDANSETRON HCL 4 MG PO TABS
4.0000 mg | ORAL_TABLET | Freq: Four times a day (QID) | ORAL | Status: DC | PRN
  Administered 2021-04-04 – 2021-04-08 (×5): 4 mg via ORAL
  Filled 2021-04-02 (×5): qty 1

## 2021-04-02 MED ORDER — SUGAMMADEX SODIUM 200 MG/2ML IV SOLN
INTRAVENOUS | Status: DC | PRN
  Administered 2021-04-02: 200 mg via INTRAVENOUS

## 2021-04-02 MED ORDER — METOCLOPRAMIDE HCL 5 MG/ML IJ SOLN
5.0000 mg | Freq: Three times a day (TID) | INTRAMUSCULAR | Status: DC | PRN
  Administered 2021-04-06 – 2021-04-07 (×2): 10 mg via INTRAVENOUS
  Filled 2021-04-02 (×2): qty 2

## 2021-04-02 MED ORDER — SODIUM CHLORIDE 0.9 % IV SOLN: INTRAVENOUS | Status: DC

## 2021-04-02 MED ORDER — PHENOL 1.4 % MT LIQD: 1.0000 | OROMUCOSAL | Status: DC | PRN

## 2021-04-02 MED ORDER — RIVAROXABAN 10 MG PO TABS
10.0000 mg | ORAL_TABLET | Freq: Every day | ORAL | Status: DC
  Administered 2021-04-03 – 2021-04-07 (×5): 10 mg via ORAL
  Filled 2021-04-02 (×5): qty 1

## 2021-04-02 MED ORDER — DIPHENHYDRAMINE HCL 12.5 MG/5ML PO ELIX: 12.5000 mg | ORAL_SOLUTION | ORAL | Status: DC | PRN

## 2021-04-02 MED ORDER — TRANEXAMIC ACID-NACL 1000-0.7 MG/100ML-% IV SOLN
1000.0000 mg | INTRAVENOUS | Status: AC
  Administered 2021-04-02: 1000 mg via INTRAVENOUS
  Filled 2021-04-02: qty 100

## 2021-04-02 MED ORDER — 0.9 % SODIUM CHLORIDE (POUR BTL) OPTIME
TOPICAL | Status: DC | PRN
  Administered 2021-04-02: 1000 mL

## 2021-04-02 MED ORDER — FENTANYL CITRATE PF 50 MCG/ML IJ SOSY
25.0000 ug | PREFILLED_SYRINGE | INTRAMUSCULAR | Status: DC | PRN
  Administered 2021-04-02 (×2): 25 ug via INTRAVENOUS

## 2021-04-02 MED ORDER — ORAL CARE MOUTH RINSE
15.0000 mL | Freq: Once | OROMUCOSAL | Status: AC
  Administered 2021-04-02: 15 mL via OROMUCOSAL

## 2021-04-02 MED ORDER — POLYETHYLENE GLYCOL 3350 17 G PO PACK: 17.0000 g | PACK | Freq: Every day | ORAL | Status: DC | PRN

## 2021-04-02 MED ORDER — TRAMADOL HCL 50 MG PO TABS
50.0000 mg | ORAL_TABLET | Freq: Two times a day (BID) | ORAL | Status: DC | PRN
  Administered 2021-04-03 – 2021-04-08 (×6): 50 mg via ORAL
  Filled 2021-04-02 (×6): qty 1

## 2021-04-02 MED ORDER — ONDANSETRON HCL 4 MG/2ML IJ SOLN
INTRAMUSCULAR | Status: DC | PRN
  Administered 2021-04-02: 4 mg via INTRAVENOUS

## 2021-04-02 MED ORDER — POVIDONE-IODINE 10 % EX SWAB
2.0000 "application " | Freq: Once | CUTANEOUS | Status: AC
  Administered 2021-04-02: 2 via TOPICAL

## 2021-04-02 MED ORDER — TRANEXAMIC ACID-NACL 1000-0.7 MG/100ML-% IV SOLN
1000.0000 mg | Freq: Once | INTRAVENOUS | Status: AC
  Administered 2021-04-02: 1000 mg via INTRAVENOUS
  Filled 2021-04-02: qty 100

## 2021-04-02 MED ORDER — LACTATED RINGERS IV SOLN: INTRAVENOUS | Status: DC

## 2021-04-02 MED ORDER — ALBUTEROL SULFATE HFA 108 (90 BASE) MCG/ACT IN AERS
INHALATION_SPRAY | RESPIRATORY_TRACT | Status: DC | PRN
  Administered 2021-04-02: 2 via RESPIRATORY_TRACT

## 2021-04-02 MED ORDER — POVIDONE-IODINE 10 % EX SWAB: 2.0000 "application " | Freq: Once | CUTANEOUS | Status: DC

## 2021-04-02 MED ORDER — BISACODYL 10 MG RE SUPP: 10.0000 mg | Freq: Every day | RECTAL | Status: DC | PRN

## 2021-04-02 MED ORDER — DEXAMETHASONE SODIUM PHOSPHATE 10 MG/ML IJ SOLN
10.0000 mg | Freq: Once | INTRAMUSCULAR | Status: AC
  Administered 2021-04-03: 10 mg via INTRAVENOUS
  Filled 2021-04-02: qty 1

## 2021-04-02 MED ORDER — DOCUSATE SODIUM 100 MG PO CAPS
100.0000 mg | ORAL_CAPSULE | Freq: Two times a day (BID) | ORAL | Status: DC
  Administered 2021-04-02 – 2021-04-08 (×10): 100 mg via ORAL
  Filled 2021-04-02 (×11): qty 1

## 2021-04-02 SURGICAL SUPPLY — 43 items
ADH SKN CLS APL DERMABOND .7 (GAUZE/BANDAGES/DRESSINGS) ×1
ARTICULEZE HEAD (Hips) ×2 IMPLANT
BAG COUNTER SPONGE SURGICOUNT (BAG) IMPLANT
BAG DECANTER FOR FLEXI CONT (MISCELLANEOUS) IMPLANT
BAG SPEC THK2 15X12 ZIP CLS (MISCELLANEOUS)
BAG SPNG CNTER NS LX DISP (BAG)
BAG ZIPLOCK 12X15 (MISCELLANEOUS) IMPLANT
BLADE SAG 18X100X1.27 (BLADE) ×2 IMPLANT
COVER PERINEAL POST (MISCELLANEOUS) ×2 IMPLANT
COVER SURGICAL LIGHT HANDLE (MISCELLANEOUS) ×2 IMPLANT
CUP ACETBLR 52 OD PINNACLE (Hips) ×1 IMPLANT
DERMABOND ADVANCED (GAUZE/BANDAGES/DRESSINGS) ×1
DERMABOND ADVANCED .7 DNX12 (GAUZE/BANDAGES/DRESSINGS) ×1 IMPLANT
DRAPE FOOT SWITCH (DRAPES) ×2 IMPLANT
DRAPE STERI IOBAN 125X83 (DRAPES) ×2 IMPLANT
DRAPE U-SHAPE 47X51 STRL (DRAPES) ×4 IMPLANT
DRESSING AQUACEL AG SP 3.5X10 (GAUZE/BANDAGES/DRESSINGS) ×1 IMPLANT
DRSG AQUACEL AG ADV 3.5X10 (GAUZE/BANDAGES/DRESSINGS) ×1 IMPLANT
DRSG AQUACEL AG SP 3.5X10 (GAUZE/BANDAGES/DRESSINGS) ×2
DURAPREP 26ML APPLICATOR (WOUND CARE) ×2 IMPLANT
ELECT REM PT RETURN 15FT ADLT (MISCELLANEOUS) ×2 IMPLANT
ELIMINATOR HOLE APEX DEPUY (Hips) ×1 IMPLANT
GLOVE SURG ENC MOIS LTX SZ7 (GLOVE) ×2 IMPLANT
GLOVE SURG UNDER LTX SZ6.5 (GLOVE) ×2 IMPLANT
GLOVE SURG UNDER POLY LF SZ7.5 (GLOVE) ×2 IMPLANT
GOWN STRL REUS W/TWL LRG LVL3 (GOWN DISPOSABLE) ×4 IMPLANT
HEAD ARTICULEZE (Hips) IMPLANT
HOLDER FOLEY CATH W/STRAP (MISCELLANEOUS) ×2 IMPLANT
KIT TURNOVER KIT A (KITS) IMPLANT
LINER NEUTRAL 52X36MM PLUS 4 (Liner) ×1 IMPLANT
PACK ANTERIOR HIP CUSTOM (KITS) ×2 IMPLANT
PENCIL SMOKE EVACUATOR (MISCELLANEOUS) IMPLANT
SCREW 6.5MMX30MM (Screw) ×1 IMPLANT
STEM FEMORAL SZ 6MM STD ACTIS (Stem) ×1 IMPLANT
SUT MNCRL AB 4-0 PS2 18 (SUTURE) ×2 IMPLANT
SUT STRATAFIX 0 PDS 27 VIOLET (SUTURE) ×2
SUT VIC AB 1 CT1 36 (SUTURE) ×6 IMPLANT
SUT VIC AB 2-0 CT1 27 (SUTURE) ×4
SUT VIC AB 2-0 CT1 TAPERPNT 27 (SUTURE) ×2 IMPLANT
SUTURE STRATFX 0 PDS 27 VIOLET (SUTURE) ×1 IMPLANT
TRAY FOLEY MTR SLVR 16FR STAT (SET/KITS/TRAYS/PACK) IMPLANT
TUBE SUCTION HIGH CAP CLEAR NV (SUCTIONS) ×2 IMPLANT
WATER STERILE IRR 1000ML POUR (IV SOLUTION) ×2 IMPLANT

## 2021-04-02 NOTE — Anesthesia Procedure Notes (Signed)

## 2021-04-02 NOTE — Op Note (Signed)
NAME:  [name]                ACCOUNT NO.: [id number]      MEDICAL RECORD NO.: [id number]      FACILITY:  [facility]      PHYSICIAN:  Mauri Pole  DATE OF BIRTH:  1932-08-04     DATE OF PROCEDURE:  04/02/2021                                 OPERATIVE REPORT         PREOPERATIVE DIAGNOSIS: Right hip displaced femoral neck fracture      POSTOPERATIVE DIAGNOSIS:  Right hip displaced femoral neck fracture      PROCEDURE:  Right total hip replacement through an anterior approach   utilizing DePuy THR system, component size 52 mm pinnacle cup, a size 36+4 neutral   Altrex liner, a size 6 standard Actis stem with a 36+5 Articuleze metal head ball.      SURGEON:  Pietro Cassis. Alvan Dame, M.D.      ASSISTANT:  Costella Hatcher, PA-C     ANESTHESIA:  Spinal.      SPECIMENS:  None.      COMPLICATIONS:  None.      BLOOD LOSS:  250 cc     DRAINS:  None.      INDICATION OF THE PROCEDURE:  [name] is a 87 y.o. female who had   presented to office for evaluation of right hip pain.  [name] is a very pleasant female who fell off a riser at church while decorating for Christmas.  She had immediate onset of right hip pain and was taken to a local ER. Radiographs revealed a displaced right femoral neck fracture.  She requested transfer to [facility] for me to manage based on our history.  I reviewed with her the fracture pattern and the plan to perform a total hip replacement versus hemiarthroplasty which we had tried on her left hip but converted to a total hip. Consent was obtained for   benefit of pain relief.  Specific risks of infection, DVT, component   failure, dislocation, neurovascular injury, and need for revision surgery were reviewed.     PROCEDURE IN DETAIL:  The patient was brought to operative theater.   Once adequate anesthesia, preoperative antibiotics, 2 gm of Ancef, 1 gm of Tranexamic Acid, and 10 mg of Decadron were administered, the patient  was positioned supine on the [employer] table.  Once the patient was safely positioned with adequate padding of boney prominences we predraped out the hip, and used fluoroscopy to confirm orientation of the pelvis.      The right hip was then prepped and draped from proximal iliac crest to   mid thigh with a shower curtain technique.      Time-out was performed identifying the patient, planned procedure, and the appropriate extremity.     An incision was then made 2 cm lateral to the   anterior superior iliac spine extending over the orientation of the   tensor fascia lata muscle and sharp dissection was carried down to the   fascia of the muscle.      The fascia was then incised.  The muscle belly was identified and swept   laterally and retractor placed along the superior neck.  Following   cauterization of the circumflex vessels and removing some pericapsular  fat, a second cobra retractor was placed on the inferior neck.  A T-capsulotomy was made along the line of the   superior neck to the trochanteric fossa, then extended proximally and   distally.  Tag sutures were placed and the retractors were then placed   intracapsular.  We then identified the femoral neck fracture.  I made a neck osteotomy with the femur on traction.  The femoral neck fractured segment and femoral  head were removed without difficulty or complication.  Traction was let   off and retractors were placed posterior and anterior around the   acetabulum.      The labrum and foveal tissue were debrided.  I began reaming with a 45 mm   reamer and reamed up to 51 mm reamer with good bony bed preparation and a 52 mm  cup was chosen.  The final 52 mm Pinnacle cup was then impacted under fluoroscopy to confirm the depth of penetration and orientation with respect to   Abduction and forward flexion.  A screw was placed into the ilium followed by the hole eliminator.  The final   36+4 neutral Altrex liner was impacted with  good visualized rim fit.  The cup was positioned anatomically within the acetabular portion of the pelvis.      At this point, the femur was rolled to 100 degrees.  Further capsule was   released off the inferior aspect of the femoral neck.  I then   released the superior capsule proximally.  With the leg in a neutral position the hook was placed laterally   along the femur under the vastus lateralis origin and elevated manually and then held in position using the hook attachment on the bed.  The leg was then extended and adducted with the leg rolled to 100   degrees of external rotation.  Retractors were placed along the medial calcar and posteriorly over the greater trochanter.  Once the proximal femur was fully   exposed, I used a box osteotome to set orientation.  I then began   broaching with the starting chili pepper broach and passed this by hand and then broached up to 6.  With the 6 broach in place I chose a standard offset neck and did several trial reductions.  The offset was appropriate, leg lengths   appeared to be equal best matched with the +5 head ball trial confirmed radiographically.   Given these findings, I went ahead and dislocated the hip, repositioned all   retractors and positioned the right hip in the extended and abducted position.  The final 6 standard Actis stem was   chosen and it was impacted down to the level of neck cut.  Based on this   and the trial reductions, a final 36+5 Articuleze metal head ball was chosen and   impacted onto a clean and dry trunnion, and the hip was reduced.  The   hip had been irrigated throughout the case again at this point.  I did   reapproximate the superior capsular leaflet to the anterior leaflet   using #1 Vicryl.  The fascia of the   tensor fascia lata muscle was then reapproximated using #1 Vicryl and #0 Stratafix sutures.  The   remaining wound was closed with 2-0 Vicryl and running 4-0 Monocryl.   The hip was cleaned, dried,  and dressed sterilely using Dermabond and   Aquacel dressing.  The patient was then brought   to recovery room in stable condition  tolerating the procedure well.    Costella Hatcher, PA-C was present for the entirety of the case involved from   preoperative positioning, perioperative retractor management, general   facilitation of the case, as well as primary wound closure as assistant.            Pietro Cassis Alvan Dame, M.D.        04/02/2021 1:01 PM

## 2021-04-02 NOTE — Transfer of Care (Signed)
Immediate Anesthesia Transfer of Care Note  Patient: [name]  Procedure(s) Performed: TOTAL HIP ARTHROPLASTY ANTERIOR APPROACH (Right: Hip)  Patient Location: PACU  Anesthesia Type:General  Level of Consciousness: awake  Airway & Oxygen Therapy: Patient Spontanous Breathing and Patient connected to face mask oxygen  Post-op Assessment: Report given to RN, Post -op Vital signs reviewed and stable and Patient moving all extremities X 4  Post vital signs: Reviewed and stable  Last Vitals:  Vitals Value Taken Time  BP 136/46 04/02/21 1551  Temp    Pulse 113 04/02/21 1553  Resp 25 04/02/21 1553  SpO2 100 % 04/02/21 1553  Vitals shown include unvalidated device data.  Last Pain:  Vitals:   04/02/21 1147  TempSrc: Oral  PainSc:       Patients Stated Pain Goal: 2 (40/81/44 8185)  Complications: No notable events documented.

## 2021-04-02 NOTE — H&P (View-Only) (Signed)
Patient ID: [name], female   DOB: [birth year], 87 y.o.   MRN: [id number]  Seen and evaluated today for surgery She is hurting some but no other events  Displaced right femoral neck fracture NPO To OR today for right total hip replacement Consent ordered  Post op plan dependent on functionality with PT but anticipate WBAT

## 2021-04-02 NOTE — Progress Notes (Signed)
PROGRESS NOTE    [name]  GEX:[id number] DOB: September 02, 1932 DOA: 03/31/2021 PCP: Crist Infante, MD   Brief Narrative:  [name] is a 87 y.o. female with a history of DVT on chronic anticoagulation (Xarelto), hyperlipidemia, hypertension, history of breast cancer, stage II chronic kidney disease, asthma, rheumatoid arthritis.  Patient presents after mechanical fall while putting up decorations at church.  She fell to her right side with moderate pain in her shoulder neck chest and hip unable to bear weight, in the ED patient's imaging shows right femoral neck fracture, orthopedic surgery was consulted for further evaluation and treatment.  Of note last dose of Xarelto was Saturday, 03/31/2019 2 PM.  Tentative plan for ORIF per surgical recommendations later today on the 22nd.  Patient has had multiple surgeries in the past, previously requiring discharge to SNF, she is agreeable at this time for SNF discharge if necessary but is requesting evaluation for home health and therapy so she can spend Thanksgiving with her family.   New events last 24 hours / Subjective: No acute issues or events overnight, denies nausea vomiting diarrhea constipation headache fevers chills or chest pain.  Right hip pain ongoing but markedly well controlled on current regimen, resting comfortably.   Assessment & Plan:   Right hip fracture after mechanical fall -Orthopedic surgery planning for surgical intervention 04/02/21 -Continue to hold Xarelto -Pain control -PT OT postoperatively    Chronic diastolic heart failure -Not in acute exacerbation, appears euvolemic -Monitor I's and O's given n.p.o. status perioperatively, resume home medications once able to tolerate p.o. postoperatively  History of DVT -Continue to hold Xarelto perioperatively -defer to orthopedic surgery for reinitiation, appreciate their insight and recommendations  CKD stage IIIb -Near baseline - continue to follow    Hypertension -Continue Avapro   Hyperlipidemia -Continue Crestor  DVT prophylaxis: SCDs -last Xarelto dose 03/31/2019 2 PM Code Status: Full Family Communication: None present  Status is: Inpatient  Dispo: The patient is from: Home              Anticipated d/[name] is to: To be determined pending clinical course and PT recommendations              Anticipated d/[name] date is: 48 to 72 hours              Patient currently not medically stable for discharge given ongoing need for surgical repair of right hip fracture IV pain control  Consultants:  Orthopedic surgery  Procedures:  Tentatively planned ORIF 04/02/2021  Antimicrobials:  Perioperatively  Objective: Vitals:   04/01/21 1637 04/01/21 1856 04/01/21 2012 04/02/21 0537  BP: (!) 154/55 (!) 140/58 (!) 149/66 (!) 160/60  Pulse: (!) 110 73 72 86  Resp: 18 20 18 18   Temp:  98.9 F (37.2 [name]) 98.9 F (37.2 [name]) 99.5 F (37.5 [name])  TempSrc:  Oral Oral Oral  SpO2: 92% 98% 94% 96%  Weight:      Height:        Intake/Output Summary (Last 24 hours) at 04/02/2021 0737 Last data filed at 04/02/2021 0600 Gross per 24 hour  Intake 1080 ml  Output 600 ml  Net 480 ml   Filed Weights   03/31/21 1424  Weight: 63.5 kg    Examination:  General exam: Appears calm and comfortable  Respiratory system: Clear to auscultation. Respiratory effort normal. Cardiovascular system: S1 & S2 heard, RRR. No JVD, murmurs, rubs, gallops or clicks. No pedal edema. Gastrointestinal system: Abdomen is nondistended, soft and nontender.  No organomegaly or masses felt. Normal bowel sounds heard. Central nervous system: Alert and oriented. No focal neurological deficits. Extremities: Symmetric 5 x 5 power. Skin: No rashes, lesions or ulcers Psychiatry: Judgement and insight appear normal. Mood & affect appropriate.     Data Reviewed: I have personally reviewed following labs and imaging studies  CBC: Recent Labs  Lab 03/31/21 1443 04/01/21 0209   WBC 9.2 5.4  NEUTROABS 7.2  --   HGB 12.0 10.4*  HCT 37.8 32.6*  MCV 92.9 92.6  PLT 248 638   Basic Metabolic Panel: Recent Labs  Lab 03/31/21 1443 04/01/21 0209  NA 136 135  K 3.8 4.4  CL 98 98  CO2 28 29  GLUCOSE 164* 137*  BUN 20 19  CREATININE 1.49* 1.33*  CALCIUM 8.8* 8.6*   GFR: Estimated Creatinine Clearance: 27.3 mL/min (A) (by [name]-G formula based on SCr of 1.33 mg/dL (H)). Liver Function Tests: No results for input(s): AST, ALT, ALKPHOS, BILITOT, PROT, ALBUMIN in the last 168 hours. No results for input(s): LIPASE, AMYLASE in the last 168 hours. No results for input(s): AMMONIA in the last 168 hours. Coagulation Profile: Recent Labs  Lab 04/01/21 0209  INR 1.1   Cardiac Enzymes: No results for input(s): CKTOTAL, CKMB, CKMBINDEX, TROPONINI in the last 168 hours. BNP (last 3 results) No results for input(s): PROBNP in the last 8760 hours. HbA1C: No results for input(s): HGBA1C in the last 72 hours. CBG: No results for input(s): GLUCAP in the last 168 hours. Lipid Profile: No results for input(s): CHOL, HDL, LDLCALC, TRIG, CHOLHDL, LDLDIRECT in the last 72 hours. Thyroid Function Tests: No results for input(s): TSH, T4TOTAL, FREET4, T3FREE, THYROIDAB in the last 72 hours. Anemia Panel: No results for input(s): VITAMINB12, FOLATE, FERRITIN, TIBC, IRON, RETICCTPCT in the last 72 hours. Sepsis Labs: No results for input(s): PROCALCITON, LATICACIDVEN in the last 168 hours.  Recent Results (from the past 240 hour(s))  Resp Panel by RT-PCR (Flu A&B, Covid) Nasopharyngeal Swab     Status: None   Collection Time: 03/31/21  4:44 PM   Specimen: Nasopharyngeal Swab; Nasopharyngeal(NP) swabs in vial transport medium  Result Value Ref Range Status   SARS Coronavirus 2 by RT PCR NEGATIVE NEGATIVE Final    Comment: (NOTE) SARS-CoV-2 target nucleic acids are NOT DETECTED.  The SARS-CoV-2 RNA is generally detectable in upper respiratory specimens during the acute  phase of infection. The lowest concentration of SARS-CoV-2 viral copies this assay can detect is 138 copies/mL. A negative result does not preclude SARS-Cov-2 infection and should not be used as the sole basis for treatment or other patient management decisions. A negative result may occur with  improper specimen collection/handling, submission of specimen other than nasopharyngeal swab, presence of viral mutation(s) within the areas targeted by this assay, and inadequate number of viral copies(<138 copies/mL). A negative result must be combined with clinical observations, patient history, and epidemiological information. The expected result is Negative.  Fact Sheet for Patients:  EntrepreneurPulse.com.au  Fact Sheet for Healthcare Providers:  IncredibleEmployment.be  This test is no t yet approved or cleared by the [place] FDA and  has been authorized for detection and/or diagnosis of SARS-CoV-2 by FDA under an Emergency Use Authorization (EUA). This EUA will remain  in effect (meaning this test can be used) for the duration of the COVID-19 declaration under Section 564(b)(1) of the Act, 21 U.S.[name].section 360bbb-3(b)(1), unless the authorization is terminated  or revoked sooner.       Influenza  A by PCR NEGATIVE NEGATIVE Final   Influenza B by PCR NEGATIVE NEGATIVE Final    Comment: (NOTE) The Xpert Xpress SARS-CoV-2/FLU/RSV plus assay is intended as an aid in the diagnosis of influenza from Nasopharyngeal swab specimens and should not be used as a sole basis for treatment. Nasal washings and aspirates are unacceptable for Xpert Xpress SARS-CoV-2/FLU/RSV testing.  Fact Sheet for Patients: EntrepreneurPulse.com.au  Fact Sheet for Healthcare Providers: IncredibleEmployment.be  This test is not yet approved or cleared by the [place] FDA and has been authorized for detection and/or diagnosis of  SARS-CoV-2 by FDA under an Emergency Use Authorization (EUA). This EUA will remain in effect (meaning this test can be used) for the duration of the COVID-19 declaration under Section 564(b)(1) of the Act, 21 U.S.[name]. section 360bbb-3(b)(1), unless the authorization is terminated or revoked.  Performed at [facility], [address] [place], [place] [address]   Surgical pcr screen     Status: None   Collection Time: 04/01/21  8:41 PM   Specimen: Nasal Mucosa; Nasal Swab  Result Value Ref Range Status   MRSA, PCR NEGATIVE NEGATIVE Final   Staphylococcus aureus NEGATIVE NEGATIVE Final    Comment: (NOTE) The Xpert SA Assay (FDA approved for NASAL specimens in patients 52 years of age and older), is one component of a comprehensive surveillance program. It is not intended to diagnose infection nor to guide or monitor treatment. Performed at [facility], [place] [address] [place], [place] [address]          Radiology Studies: DG Shoulder Right  Result Date: 03/31/2021 CLINICAL DATA:  [place], right shoulder pain EXAM: RIGHT SHOULDER - 2+ VIEW COMPARISON:  09/07/2018 FINDINGS: Frontal and transscapular views of the right shoulder are obtained. Prior healed fracture of the greater tuberosity of the proximal right humerus. No acute fracture, subluxation, or dislocation. Moderate spurring of the acromioclavicular joint. Marked narrowing of the acromial humeral interval consistent with chronic longstanding rotator cuff tear. Visualized portions of the right chest are clear. IMPRESSION: 1. Chronic posttraumatic and degenerative changes. No acute bony abnormality. Electronically Signed   By: Randa Ngo M.D.   On: 03/31/2021 16:02   CT Head Wo Contrast  Result Date: 03/31/2021 CLINICAL DATA:  Fall, pain EXAM: CT HEAD WITHOUT CONTRAST CT CERVICAL SPINE WITHOUT CONTRAST TECHNIQUE: Multidetector CT imaging of the head and cervical spine was performed following the standard  protocol without intravenous contrast. Multiplanar CT image reconstructions of the cervical spine were also generated. COMPARISON:  None. FINDINGS: CT HEAD FINDINGS Brain: No evidence of acute infarction, hemorrhage, hydrocephalus, extra-axial collection or mass lesion/mass effect. Vascular: No hyperdense vessel or unexpected calcification. Skull: Normal. Negative for fracture or focal lesion. Sinuses/Orbits: No acute finding. Other: Soft tissue contusion of the right parietal scalp (series 2, image 14). CT CERVICAL SPINE FINDINGS Alignment: Degenerative straightening and reversal of the normal cervical lordosis. Skull base and vertebrae: No acute fracture. No primary bone lesion or focal pathologic process. Soft tissues and spinal canal: No prevertebral fluid or swelling. No visible canal hematoma. Disc levels: Moderate multilevel disc degenerative disease and osteophytosis of C4 through C7 with otherwise preserved disc spaces. Upper chest: Negative. Other: None. IMPRESSION: 1. No acute intracranial pathology. 2. Soft tissue contusion of the right parietal scalp. 3. No fracture or static subluxation of the cervical spine. 4. Moderate multilevel disc degenerative disease and osteophytosis of C4 through C7. Electronically Signed   By: Delanna Ahmadi M.D.   On: 03/31/2021 15:27  CT Cervical Spine Wo Contrast  Result Date: 03/31/2021 CLINICAL DATA:  Fall, pain EXAM: CT HEAD WITHOUT CONTRAST CT CERVICAL SPINE WITHOUT CONTRAST TECHNIQUE: Multidetector CT imaging of the head and cervical spine was performed following the standard protocol without intravenous contrast. Multiplanar CT image reconstructions of the cervical spine were also generated. COMPARISON:  None. FINDINGS: CT HEAD FINDINGS Brain: No evidence of acute infarction, hemorrhage, hydrocephalus, extra-axial collection or mass lesion/mass effect. Vascular: No hyperdense vessel or unexpected calcification. Skull: Normal. Negative for fracture or focal  lesion. Sinuses/Orbits: No acute finding. Other: Soft tissue contusion of the right parietal scalp (series 2, image 14). CT CERVICAL SPINE FINDINGS Alignment: Degenerative straightening and reversal of the normal cervical lordosis. Skull base and vertebrae: No acute fracture. No primary bone lesion or focal pathologic process. Soft tissues and spinal canal: No prevertebral fluid or swelling. No visible canal hematoma. Disc levels: Moderate multilevel disc degenerative disease and osteophytosis of C4 through C7 with otherwise preserved disc spaces. Upper chest: Negative. Other: None. IMPRESSION: 1. No acute intracranial pathology. 2. Soft tissue contusion of the right parietal scalp. 3. No fracture or static subluxation of the cervical spine. 4. Moderate multilevel disc degenerative disease and osteophytosis of C4 through C7. Electronically Signed   By: Delanna Ahmadi M.D.   On: 03/31/2021 15:27   DG Hip Unilat With Pelvis 2-3 Views Right  Result Date: 03/31/2021 CLINICAL DATA:  [place], right hip pain and EXAM: DG HIP (WITH OR WITHOUT PELVIS) 2-3V RIGHT COMPARISON:  09/07/2018 FINDINGS: Frontal view of the pelvis as well as frontal and cross-table lateral views of the right hip are obtained. There is a subcapital right femoral neck fracture with mild impaction and valgus angulation. No dislocation. No other acute displaced fractures. Unremarkable left hip arthroplasty. IMPRESSION: 1. Subcapital right femoral neck fracture with mild impaction and valgus angulation. Electronically Signed   By: Randa Ngo M.D.   On: 03/31/2021 16:00    Scheduled Meds:  fluticasone  1 spray Each Nare Daily   fluticasone furoate-vilanterol  1 puff Inhalation Daily   irbesartan  75 mg Oral Daily   loratadine  10 mg Oral Daily   montelukast  10 mg Oral QHS   pantoprazole  40 mg Oral Daily   pramipexole  0.375 mg Oral QHS   rosuvastatin  5 mg Oral QHS   senna  1 tablet Oral BID   Continuous Infusions:   LOS: 2 days    Time spent: 330min  [name] [name] Jalynn Waddell, DO [facility]  If 7PM-7AM, please contact night-coverage [email or web address]  04/02/2021, 7:37 AM

## 2021-04-02 NOTE — Plan of Care (Signed)
  Problem: Clinical Measurements: Goal: Diagnostic test results will improve Outcome: Progressing   Problem: Coping: Goal: Level of anxiety will decrease Outcome: Progressing   

## 2021-04-02 NOTE — Interval H&P Note (Signed)
History and Physical Interval Note:  04/02/2021 12:59 PM  Leda Gauze Pollinger  has presented today for surgery, with the diagnosis of right femoral neck fracture.  The various methods of treatment have been discussed with the patient and family. After consideration of risks, benefits and other options for treatment, the patient has consented to  Procedure(s): TOTAL HIP ARTHROPLASTY ANTERIOR APPROACH (Right) as a surgical intervention.  The patient's history has been reviewed, patient examined, no change in status, stable for surgery.  I have reviewed the patient's chart and labs.  Questions were answered to the patient's satisfaction.     Mauri Pole

## 2021-04-02 NOTE — Anesthesia Postprocedure Evaluation (Signed)
Anesthesia Post Note  Patient: Set designer  Procedure(s) Performed: TOTAL HIP ARTHROPLASTY ANTERIOR APPROACH (Right: Hip)     Patient location during evaluation: PACU Anesthesia Type: General Level of consciousness: awake and sedated Pain management: pain level controlled Vital Signs Assessment: post-procedure vital signs reviewed and stable Respiratory status: spontaneous breathing Cardiovascular status: stable Postop Assessment: no apparent nausea or vomiting Anesthetic complications: no   No notable events documented.  Last Vitals:  Vitals:   04/02/21 1600 04/02/21 1615  BP: (!) 119/42 (!) 114/45  Pulse: (!) 103 83  Resp: 12 10  Temp:    SpO2: 99% 92%    Last Pain:  Vitals:   04/02/21 1615  TempSrc:   PainSc: Shidler Jr

## 2021-04-02 NOTE — Anesthesia Preprocedure Evaluation (Addendum)
Anesthesia Evaluation  Patient identified by MRN, date of birth, ID band Patient awake    Reviewed: Allergy & Precautions, NPO status , Patient's Chart, lab work & pertinent test results  Airway Mallampati: II  TM Distance: >3 FB Neck ROM: Full    Dental no notable dental hx. (+) Upper Dentures, Partial Lower, Dental Advisory Given   Pulmonary asthma , pneumonia, resolved,    Pulmonary exam normal breath sounds clear to auscultation       Cardiovascular hypertension, Pt. on medications + CAD, + Peripheral Vascular Disease, +CHF and + DVT  + Valvular Problems/Murmurs  Rhythm:Regular Rate:Normal + Systolic murmurs Hx/o DVT 5 years ago   Neuro/Psych  Headaches, Restless legs syndrome negative psych ROS   GI/Hepatic Neg liver ROS, GERD  Medicated and Controlled,  Endo/Other  Hyperlipidemia S/P bilateral mastectomies for breast Ca  Renal/GU Renal InsufficiencyRenal disease  negative genitourinary   Musculoskeletal  (+) Arthritis , Osteoarthritis and Rheumatoid disorders,  Osteoporosis Right proximal humerus Fx   Abdominal Normal abdominal exam  (+)   Peds  Hematology  (+) Blood dyscrasia, anemia , Xarelto- last dose 09/18/2018   Anesthesia Other Findings  06/21/2020 8:15 PM EST   Stress test is normal  No evidence of blood flow problem to the heart to cause chest pain        Reproductive/Obstetrics                           Anesthesia Physical  Anesthesia Plan  ASA: III  Anesthesia Plan: General   Post-op Pain Management:  Regional for Post-op pain   Induction: Intravenous  PONV Risk Score and Plan: 4 or greater and Ondansetron and Treatment may vary due to age or medical condition  Airway Management Planned: Oral ETT  Additional Equipment: None  Intra-op Plan:   Post-operative Plan: Extubation in OR  Informed Consent: I have reviewed the patients History and Physical,  chart, labs and discussed the procedure including the risks, benefits and alternatives for the proposed anesthesia with the patient or authorized representative who has indicated his/her understanding and acceptance.     Dental advisory given  Plan Discussed with: CRNA  Anesthesia Plan Comments:        Anesthesia Quick Evaluation

## 2021-04-02 NOTE — Discharge Instructions (Addendum)
INSTRUCTIONS AFTER JOINT REPLACEMENT   Hold Forteo for 6 months after hip replacement.  Remove items at home which could result in a fall. This includes throw rugs or furniture in walking pathways ICE to the affected joint every three hours while awake for 30 minutes at a time, for at least the first 3-5 days, and then as needed for pain and swelling.  Continue to use ice for pain and swelling. You may notice swelling that will progress down to the foot and ankle.  This is normal after surgery.  Elevate your leg when you are not up walking on it.   Continue to use the breathing machine you got in the hospital (incentive spirometer) which will help keep your temperature down.  It is common for your temperature to cycle up and down following surgery, especially at night when you are not up moving around and exerting yourself.  The breathing machine keeps your lungs expanded and your temperature down.   DIET:  As you were doing prior to hospitalization, we recommend a well-balanced diet.  DRESSING / WOUND CARE / SHOWERING  Keep the surgical dressing until follow up.  The dressing is water proof, so you can shower without any extra covering.  IF THE DRESSING FALLS OFF or the wound gets wet inside, change the dressing with sterile gauze.  Please use good hand washing techniques before changing the dressing.  Do not use any lotions or creams on the incision until instructed by your surgeon.    ACTIVITY  Increase activity slowly as tolerated, but follow the weight bearing instructions below.   No driving for 6 weeks or until further direction given by your physician.  You cannot drive while taking narcotics.  No lifting or carrying greater than 10 lbs. until further directed by your surgeon. Avoid periods of inactivity such as sitting longer than an hour when not asleep. This helps prevent blood clots.  You may return to work once you are authorized by your doctor.     WEIGHT BEARING   Weight  bearing as tolerated with assist device (walker, cane, etc) as directed, use it as long as suggested by your surgeon or therapist, typically at least 4-6 weeks.   EXERCISES  Results after joint replacement surgery are often greatly improved when you follow the exercise, range of motion and muscle strengthening exercises prescribed by your doctor. Safety measures are also important to protect the joint from further injury. Any time any of these exercises cause you to have increased pain or swelling, decrease what you are doing until you are comfortable again and then slowly increase them. If you have problems or questions, call your caregiver or physical therapist for advice.   Rehabilitation is important following a joint replacement. After just a few days of immobilization, the muscles of the leg can become weakened and shrink (atrophy).  These exercises are designed to build up the tone and strength of the thigh and leg muscles and to improve motion. Often times heat used for twenty to thirty minutes before working out will loosen up your tissues and help with improving the range of motion but do not use heat for the first two weeks following surgery (sometimes heat can increase post-operative swelling).   These exercises can be done on a training (exercise) mat, on the floor, on a table or on a bed. Use whatever works the best and is most comfortable for you.    Use music or television while you are exercising so  that the exercises are a pleasant break in your day. This will make your life better with the exercises acting as a break in your routine that you can look forward to.   Perform all exercises about fifteen times, three times per day or as directed.  You should exercise both the operative leg and the other leg as well.  Exercises include:   Quad Sets - Tighten up the muscle on the front of the thigh (Quad) and hold for 5-10 seconds.   Straight Leg Raises - With your knee straight (if you  were given a brace, keep it on), lift the leg to 60 degrees, hold for 3 seconds, and slowly lower the leg.  Perform this exercise against resistance later as your leg gets stronger.  Leg Slides: Lying on your back, slowly slide your foot toward your buttocks, bending your knee up off the floor (only go as far as is comfortable). Then slowly slide your foot back down until your leg is flat on the floor again.  Angel Wings: Lying on your back spread your legs to the side as far apart as you can without causing discomfort.  Hamstring Strength:  Lying on your back, push your heel against the floor with your leg straight by tightening up the muscles of your buttocks.  Repeat, but this time bend your knee to a comfortable angle, and push your heel against the floor.  You may put a pillow under the heel to make it more comfortable if necessary.   A rehabilitation program following joint replacement surgery can speed recovery and prevent re-injury in the future due to weakened muscles. Contact your doctor or a physical therapist for more information on knee rehabilitation.    CONSTIPATION  Constipation is defined medically as fewer than three stools per week and severe constipation as less than one stool per week.  Even if you have a regular bowel pattern at home, your normal regimen is likely to be disrupted due to multiple reasons following surgery.  Combination of anesthesia, postoperative narcotics, change in appetite and fluid intake all can affect your bowels.   YOU MUST use at least one of the following options; they are listed in order of increasing strength to get the job done.  They are all available over the counter, and you may need to use some, POSSIBLY even all of these options:    Drink plenty of fluids (prune juice may be helpful) and high fiber foods Colace 100 mg by mouth twice a day  Senokot for constipation as directed and as needed Dulcolax (bisacodyl), take with full glass of water   Miralax (polyethylene glycol) once or twice a day as needed.  If you have tried all these things and are unable to have a bowel movement in the first 3-4 days after surgery call either your surgeon or your primary doctor.    If you experience loose stools or diarrhea, hold the medications until you stool forms back up.  If your symptoms do not get better within 1 week or if they get worse, check with your doctor.  If you experience "the worst abdominal pain ever" or develop nausea or vomiting, please contact the office immediately for further recommendations for treatment.   ITCHING:  If you experience itching with your medications, try taking only a single pain pill, or even half a pain pill at a time.  You can also use Benadryl over the counter for itching or also to help with sleep.  TED HOSE STOCKINGS:  Use stockings on both legs until for at least 2 weeks or as directed by physician office. They may be removed at night for sleeping.  MEDICATIONS:  See your medication summary on the "After Visit Summary" that nursing will review with you.  You may have some home medications which will be placed on hold until you complete the course of blood thinner medication.  It is important for you to complete the blood thinner medication as prescribed.  PRECAUTIONS:  If you experience chest pain or shortness of breath - call 911 immediately for transfer to the hospital emergency department.   If you develop a fever greater that 101 F, purulent drainage from wound, increased redness or drainage from wound, foul odor from the wound/dressing, or calf pain - CONTACT YOUR SURGEON.                                                   FOLLOW-UP APPOINTMENTS:  If you do not already have a post-op appointment, please call the office for an appointment to be seen by your surgeon.  Guidelines for how soon to be seen are listed in your "After Visit Summary", but are typically between 1-4 weeks after surgery.  OTHER  INSTRUCTIONS:   Knee Replacement:  Do not place pillow under knee, focus on keeping the knee straight while resting. CPM instructions: 0-90 degrees, 2 hours in the morning, 2 hours in the afternoon, and 2 hours in the evening. Place foam block, curve side up under heel at all times except when in CPM or when walking.  DO NOT modify, tear, cut, or change the foam block in any way.  POST-OPERATIVE OPIOID TAPER INSTRUCTIONS: It is important to wean off of your opioid medication as soon as possible. If you do not need pain medication after your surgery it is ok to stop day one. Opioids include: Codeine, Hydrocodone(Norco, Vicodin), Oxycodone(Percocet, oxycontin) and hydromorphone amongst others.  Long term and even short term use of opiods can cause: Increased pain response Dependence Constipation Depression Respiratory depression And more.  Withdrawal symptoms can include Flu like symptoms Nausea, vomiting And more Techniques to manage these symptoms Hydrate well Eat regular healthy meals Stay active Use relaxation techniques(deep breathing, meditating, yoga) Do Not substitute Alcohol to help with tapering If you have been on opioids for less than two weeks and do not have pain than it is ok to stop all together.  Plan to wean off of opioids This plan should start within one week post op of your joint replacement. Maintain the same interval or time between taking each dose and first decrease the dose.  Cut the total daily intake of opioids by one tablet each day Next start to increase the time between doses. The last dose that should be eliminated is the evening dose.   MAKE SURE YOU:  Understand these instructions.  Get help right away if you are not doing well or get worse.    Thank you for letting us be a part of your medical care team.  It is a privilege we respect greatly.  We hope these instructions will help you stay on track for a fast and full recovery!

## 2021-04-02 NOTE — Progress Notes (Signed)
Patient ID: [name], female   DOB: 1933-03-27, 87 y.o.   MRN: [id number]  Seen and evaluated today for surgery She is hurting some but no other events  Displaced right femoral neck fracture NPO To OR today for right total hip replacement Consent ordered  Post op plan dependent on functionality with PT but anticipate WBAT

## 2021-04-03 ENCOUNTER — Encounter (HOSPITAL_COMMUNITY): Payer: Self-pay | Admitting: Orthopedic Surgery

## 2021-04-03 DIAGNOSIS — S72044A Nondisplaced fracture of base of neck of right femur, initial encounter for closed fracture: Secondary | ICD-10-CM | POA: Diagnosis not present

## 2021-04-03 LAB — CBC
HCT: 31.2 % — ABNORMAL LOW (ref 36.0–46.0)
Hemoglobin: 10 g/dL — ABNORMAL LOW (ref 12.0–15.0)
MCH: 29.4 pg (ref 26.0–34.0)
MCHC: 32.1 g/dL (ref 30.0–36.0)
MCV: 91.8 fL (ref 80.0–100.0)
Platelets: 157 10*3/uL (ref 150–400)
RBC: 3.4 MIL/uL — ABNORMAL LOW (ref 3.87–5.11)
RDW: 12.2 % (ref 11.5–15.5)
WBC: 6.7 10*3/uL (ref 4.0–10.5)
nRBC: 0 % (ref 0.0–0.2)

## 2021-04-03 LAB — BASIC METABOLIC PANEL
Anion gap: 9 (ref 5–15)
BUN: 22 mg/dL (ref 8–23)
CO2: 25 mmol/L (ref 22–32)
Calcium: 7.6 mg/dL — ABNORMAL LOW (ref 8.9–10.3)
Chloride: 93 mmol/L — ABNORMAL LOW (ref 98–111)
Creatinine, Ser: 1.22 mg/dL — ABNORMAL HIGH (ref 0.44–1.00)
GFR, Estimated: 43 mL/min — ABNORMAL LOW (ref >60)
Glucose, Bld: 164 mg/dL — ABNORMAL HIGH (ref 70–99)
Potassium: 4.2 mmol/L (ref 3.5–5.1)
Sodium: 127 mmol/L — ABNORMAL LOW (ref 135–145)

## 2021-04-03 MED ORDER — DOCUSATE SODIUM 100 MG PO CAPS: 100.0000 mg | ORAL_CAPSULE | Freq: Two times a day (BID) | ORAL | 0 refills | Status: AC

## 2021-04-03 MED ORDER — METHOCARBAMOL 500 MG PO TABS: 500.0000 mg | ORAL_TABLET | Freq: Four times a day (QID) | ORAL | 0 refills | Status: DC | PRN

## 2021-04-03 MED ORDER — POLYETHYLENE GLYCOL 3350 17 G PO PACK
17.0000 g | PACK | Freq: Every day | ORAL | 0 refills | Status: DC | PRN
Start: 2021-04-03 — End: 2021-06-18

## 2021-04-03 MED ORDER — HYDROCODONE-ACETAMINOPHEN 5-325 MG PO TABS: 1.0000 | ORAL_TABLET | Freq: Four times a day (QID) | ORAL | 0 refills | Status: DC | PRN

## 2021-04-03 MED ORDER — GUAIFENESIN-DM 100-10 MG/5ML PO SYRP
5.0000 mL | ORAL_SOLUTION | ORAL | Status: DC | PRN
  Administered 2021-04-03 – 2021-04-04 (×2): 5 mL via ORAL
  Filled 2021-04-03 (×2): qty 10

## 2021-04-03 NOTE — Plan of Care (Signed)
  Problem: Education: Goal: Knowledge of General Education information will improve Description: Including pain rating scale, medication(s)/side effects and non-pharmacologic comfort measures Outcome: Progressing   Problem: Activity: Goal: Risk for activity intolerance will decrease Outcome: Progressing   Problem: Nutrition: Goal: Adequate nutrition will be maintained Outcome: Progressing   

## 2021-04-03 NOTE — Evaluation (Addendum)
Physical Therapy Evaluation Patient Details Name: [name] MRN: [id number] DOB: 1932/12/09 Today's Date: 04/03/2021  History of Present Illness  [name] is a 87 y.o. female with a history of DVT on chronic anticoagulation, hyperlipidemia, hypertension, history of breast cancer, stage II chronic kidney disease, asthma, rheumatoid arthritis. Patient fell at church and sustained Right hip displaced femoral neck fracture. She is now s/p R THA via anterior approach.  Clinical Impression  Patient is s/p above surgery resulting in functional limitations due to the deficits listed below (see PT Problem List). Patient will benefit from skilled PT to increase their independence and safety with mobility to allow discharge to the venue listed below.  Pt assisted with ambulating within room and limited mobility due to increased pain in right shoulder, right hip and back (RN notified).  Pt hopeful to improve mobility and pain in hospital in anticipation of d/c home.      Recommendations for follow up therapy are one component of a multi-disciplinary discharge planning process, led by the attending physician.  Recommendations may be updated based on patient status, additional functional criteria and insurance authorization.  Follow Up Recommendations Home health PT    Assistance Recommended at Discharge Frequent or constant Supervision/Assistance  Functional Status Assessment Patient has had a recent decline in their functional status and demonstrates the ability to make significant improvements in function in a reasonable and predictable amount of time.  Equipment Recommendations  None recommended by PT    Recommendations for Other Services       Precautions / Restrictions Precautions Precautions: Fall Precaution Comments: pain in shoulder/neck, hx of R shoulder replacement Restrictions Weight Bearing Restrictions: No      Mobility  Bed Mobility Overal bed mobility: Needs  Assistance Bed Mobility: Supine to Sit     Supine to sit: HOB elevated;Mod assist     General bed mobility comments: pt in recliner    Transfers Overall transfer level: Needs assistance Equipment used: Rolling walker (2 wheels) Transfers: Sit to/from Stand Sit to Stand: Min guard           General transfer comment: verbal cues for UE and LE positioning, pt able to self assist with UEs    Ambulation/Gait Ambulation/Gait assistance: Min guard Gait Distance (Feet): 25 Feet Assistive device: Rolling walker (2 wheels) Gait Pattern/deviations: Decreased stride length;Step-to pattern;Decreased stance time - right       General Gait Details: verbal cues for sequence, RW positioning, posture, step length, pt reports increased right shoulder, right hip and back pain with ambulating  Stairs            Wheelchair Mobility    Modified Rankin (Stroke Patients Only)       Balance Overall balance assessment: Needs assistance Sitting-balance support: No upper extremity supported Sitting balance-Leahy Scale: Fair     Standing balance support: No upper extremity supported Standing balance-Leahy Scale: Fair Standing balance comment: able to take hands off of walker for ADLs                             Pertinent Vitals/Pain Pain Assessment: 0-10 Pain Score: 10-Worst pain ever Faces Pain Scale: Hurts little more Pain Location: right shoulder, back, R hip Pain Descriptors / Indicators: Grimacing;Sore Pain Intervention(s): Monitored during session;Repositioned;Patient requesting pain meds-RN notified    Home Living Family/patient expects to be discharged to:: Private residence Living Arrangements: Spouse/significant other Available Help at Discharge: Available 24 hours/day;Family Type of Home:  House Home Access: Stairs to enter Entrance Stairs-Rails: None Entrance Stairs-Number of Steps: 2 and threshold   Home Layout: One level Home Equipment: Cane -  single Barista (2 wheels);Shower seat;BSC/3in1;Tub bench;Adaptive equipment      Prior Function Prior Level of Function : Independent/Modified Independent             Mobility Comments: typically no device, used a cane when her knee was hurting. ADLs Comments: independent     Hand Dominance   Dominant Hand: Right    Extremity/Trunk Assessment   Upper Extremity Assessment Upper Extremity Assessment: RUE deficits/detail;LUE deficits/detail RUE Deficits / Details: Shoulder ROM 60 degress otherwise furntional ROM, 3-/5 shoulder, 4/5 elbow, wrist 5/5 grip 4/5 LUE Deficits / Details: WFL ROM, 4/5 strength throughout LUE Sensation: WNL LUE Coordination: WNL    Lower Extremity Assessment Lower Extremity Assessment: RLE deficits/detail RLE Deficits / Details: anticipated post op hip weakness observed, good quad contraction, able to perform ankle pumps    Cervical / Trunk Assessment Cervical / Trunk Assessment: Normal  Communication   Communication: No difficulties  Cognition Arousal/Alertness: Awake/alert Behavior During Therapy: WFL for tasks assessed/performed Overall Cognitive Status: Within Functional Limits for tasks assessed                                          General Comments      Exercises Total Joint Exercises Ankle Circles/Pumps: AROM;Both;10 reps Quad Sets: AROM;Right;10 reps Heel Slides: AAROM;Right;10 reps Hip ABduction/ADduction: AAROM;Right;10 reps   Assessment/Plan    PT Assessment Patient needs continued PT services  PT Problem List Decreased strength;Decreased activity tolerance;Decreased mobility;Decreased balance;Decreased knowledge of use of DME;Pain       PT Treatment Interventions DME instruction;Gait training;Balance training;Therapeutic exercise;Therapeutic activities;Functional mobility training;Patient/family education;Stair training    PT Goals (Current goals can be found in the Care Plan section)   Acute Rehab PT Goals PT Goal Formulation: With patient Time For Goal Achievement: 04/10/21 Potential to Achieve Goals: Good    Frequency Min 5X/week   Barriers to discharge        Co-evaluation               AM-PAC PT "6 Clicks" Mobility  Outcome Measure Help needed turning from your back to your side while in a flat bed without using bedrails?: A Little Help needed moving from lying on your back to sitting on the side of a flat bed without using bedrails?: A Little Help needed moving to and from a bed to a chair (including a wheelchair)?: A Little Help needed standing up from a chair using your arms (e.g., wheelchair or bedside chair)?: A Little Help needed to walk in hospital room?: A Little Help needed climbing 3-5 steps with a railing? : A Lot 6 Click Score: 17    End of Session Equipment Utilized During Treatment: Gait belt Activity Tolerance: Patient tolerated treatment well Patient left: in chair;with call bell/phone within reach;with chair alarm set;with family/visitor present Nurse Communication: Mobility status PT Visit Diagnosis: Other abnormalities of gait and mobility (R26.89)    Time: 0165-5374 PT Time Calculation (min) (ACUTE ONLY): 19 min   Charges:   PT Evaluation $PT Eval Low Complexity: 1 Low        Kati PT, DPT Acute Rehabilitation Services Pager: [phone] Office: [phone]   [name] 04/03/2021, 1:25 PM

## 2021-04-03 NOTE — Evaluation (Signed)
Occupational Therapy Evaluation Patient Details Name: [name] MRN: [id number] DOB: 1932-06-14 Today's Date: 04/03/2021   History of Present Illness [name] is a 87 y.o. female with a history of DVT on chronic anticoagulation, hyperlipidemia, hypertension, history of breast cancer, stage II chronic kidney disease, asthma, rheumatoid arthritis. Patient fell at church. She is now s/p R THA via a nterior approach.   Clinical Impression   [name] is an 87 year old woman s/p hip fracture repair who is typically independent and active in church (plays the piano). On evaluation she demonstrates decreased range of motion of RUE and RLE extremity, decreased activity tolerance, impaired balance and complaints of pain. Patient has a history of a shoulder replacement with baseline decreased ROM but limited by shoulder and righter lateral neck pain in to clavicle area. Patient overall set up - to mod assist for ADLs and min guard for ambulation with RW. Patient familiar with compensatory strategies and equipment from prior hip surgeries. Asked patient's husband to start locating all DME and reiterated need for equipment and AE. Patient will benefit from skilled OT services while in hospital to improve deficits and learn compensatory strategies as needed in order to return to PLOF. Recommend [place] OT services at discharge.     Recommendations for follow up therapy are one component of a multi-disciplinary discharge planning process, led by the attending physician.  Recommendations may be updated based on patient status, additional functional criteria and insurance authorization.   Follow Up Recommendations  Home health OT    Assistance Recommended at Discharge Intermittent Supervision/Assistance  Functional Status Assessment  Patient has had a recent decline in their functional status and demonstrates the ability to make significant improvements in function in a reasonable and  predictable amount of time.  Equipment Recommendations  None recommended by OT    Recommendations for Other Services       Precautions / Restrictions Precautions Precautions: Fall Precaution Comments: pain in shoulder/neck, hx of R shoulder replacement Restrictions Weight Bearing Restrictions: No      Mobility Bed Mobility Overal bed mobility: Needs Assistance Bed Mobility: Supine to Sit     Supine to sit: HOB elevated;Mod assist     General bed mobility comments: Min assist for RLE and pivoting of hips to side of bed.    Transfers Overall transfer level: Needs assistance Equipment used: Rolling walker (2 wheels) Transfers: Sit to/from Stand Sit to Stand: Min guard;From elevated surface           General transfer comment: Increased time and elevated bed to stand with RW with min guard - limited by R shoulder pain and generalized soreness. Able to ambulate towards door and back to recliner with RW and min guard.      Balance Overall balance assessment: Needs assistance Sitting-balance support: No upper extremity supported Sitting balance-Leahy Scale: Fair     Standing balance support: During functional activity Standing balance-Leahy Scale: Fair Standing balance comment: able to take hands off of walker for ADLs                           ADL either performed or assessed with clinical judgement   ADL Overall ADL's : Needs assistance/impaired Eating/Feeding: Set up;Sitting   Grooming: Set up;Sitting;Minimal assistance Grooming Details (indicate cue type and reason): Limited by R shoulder pain - unable to brush hair with R hand Upper Body Bathing: Set up;Sitting   Lower Body Bathing: Moderate assistance;Sit to/from  stand   Upper Body Dressing : Sitting;Minimal assistance   Lower Body Dressing: Moderate assistance;Sit to/from stand   Toilet Transfer: Optician, dispensing (2 wheels)   Toileting- Water quality scientist and Hygiene:  Min guard   Tub/ Banker: Min guard   Functional mobility during ADLs: Min guard;Rolling walker (2 wheels) General ADL Comments: Needs elevated seated position and increased time to rise due to pain, otherwise min gard for ambulating with RW.     Vision Baseline Vision/History: 1 Wears glasses       Perception     Praxis      Pertinent Vitals/Pain Pain Assessment: Faces Faces Pain Scale: Hurts little more Pain Location: R shoulder, R lateral neck, R hip, R side Pain Descriptors / Indicators: Grimacing;Sore Pain Intervention(s): Limited activity within patient's tolerance;RN gave pain meds during session     Hand Dominance Right   Extremity/Trunk Assessment Upper Extremity Assessment Upper Extremity Assessment: RUE deficits/detail;LUE deficits/detail RUE Deficits / Details: Shoulder ROM 60 degress otherwise furntional ROM, 3-/5 shoulder, 4/5 elbow, wrist 5/5 grip 4/5 LUE Deficits / Details: WFL ROM, 4/5 strength throughout LUE Sensation: WNL LUE Coordination: WNL   Lower Extremity Assessment Lower Extremity Assessment: Defer to PT evaluation   Cervical / Trunk Assessment Cervical / Trunk Assessment: Normal   Communication Communication Communication: No difficulties   Cognition Arousal/Alertness: Awake/alert Behavior During Therapy: WFL for tasks assessed/performed Overall Cognitive Status: Within Functional Limits for tasks assessed                                       General Comments       Exercises     Shoulder Instructions      Home Living Family/patient expects to be discharged to:: Private residence Living Arrangements: Spouse/significant other Available Help at Discharge: Available 24 hours/day;Family Type of Home: House Home Access: Stairs to enter Technical brewer of Steps: 3 Entrance Stairs-Rails: None Home Layout: One level     Bathroom Shower/Tub: Teacher, early years/pre: Handicapped  height Bathroom Accessibility: Yes   Home Equipment: Cane - single Barista (2 wheels);Shower seat;BSC/3in1;Tub bench;Adaptive equipment Adaptive Equipment: Reacher;Sock aid        Prior Functioning/Environment Prior Level of Function : Independent/Modified Independent             Mobility Comments: typically no device, used a cane when her knee was hurting. ADLs Comments: independent        OT Problem List: Decreased strength;Decreased range of motion;Decreased activity tolerance;Impaired balance (sitting and/or standing);Pain;Impaired UE functional use;Decreased knowledge of use of DME or AE      OT Treatment/Interventions: Self-care/ADL training;Therapeutic exercise;DME and/or AE instruction;Therapeutic activities;Balance training;Patient/family education    OT Goals(Current goals can be found in the care plan section) Acute Rehab OT Goals Patient Stated Goal: reduce soreness and go home with husband OT Goal Formulation: With patient Time For Goal Achievement: 04/17/21 Potential to Achieve Goals: Good  OT Frequency: Min 2X/week   Barriers to D/C:            Co-evaluation              AM-PAC OT "6 Clicks" Daily Activity     Outcome Measure Help from another person eating meals?: A Little Help from another person taking care of personal grooming?: A Little Help from another person toileting, which includes using toliet, bedpan, or urinal?: A Little Help from  another person bathing (including washing, rinsing, drying)?: A Lot Help from another person to put on and taking off regular upper body clothing?: A Little Help from another person to put on and taking off regular lower body clothing?: A Lot 6 Click Score: 16   End of Session Equipment Utilized During Treatment: Gait belt;Rolling walker (2 wheels) Nurse Communication: Mobility status  Activity Tolerance: Patient limited by pain Patient left: in chair;with call bell/phone within reach;with  chair alarm set;with nursing/sitter in room;with family/visitor present  OT Visit Diagnosis: Pain;History of falling (Z91.81)                Time: 1006-1030 OT Time Calculation (min): 24 min Charges:  OT General Charges $OT Visit: 1 Visit OT Evaluation $OT Eval Low Complexity: 1 Low OT Treatments $Self Care/Home Management : 8-22 mins  Calynn Ferrero, OTR/[facility] [place]  Office [phone] Pager: [place] 04/03/2021, 10:45 AM

## 2021-04-03 NOTE — Progress Notes (Signed)
PROGRESS NOTE    [name]  XBD:[id number] DOB: [birth year] DOA: 03/31/2021 PCP: Crist Infante, MD    Brief Narrative:  [name] is a 87 year old female with past medical history significant for DVT on Xarelto, hyperlipidemia, essential hypertension, breast cancer, CKD stage IIIb, asthma, rheumatoid arthritis who presents to [facility] ED on 11/20 following mechanical fall while putting up decorations at church.  Patient developed severe right-sided pain in her right hip and unable to bear weight.  Imaging in the ED notable for right femoral neck fracture.  Orthopedics was consulted.  Hospitalist service consulted for further evaluation and management.   Assessment & Plan:   Principal Problem:   Femur fracture, right (HCC) Active Problems:   GERD   History of DVT of lower extremity   CKD (chronic kidney disease) stage 3, GFR 30-59 ml/min (HCC)   CAD (coronary artery disease), native coronary artery   Chronic diastolic CHF (congestive heart failure) ([place])   Essential hypertension   Right femoral neck fracture s/p total hip arthroplasty Patient presenting to ED following mechanical fall with acute right hip pain with inability ambulate.  Right hip x-ray with subcapital right femoral neck fracture with mild impaction and valgus angulation.  Orthopedics was consulted and patient underwent total hip arthroplasty on 11/23 by Dr. Alvan Dame. --Continue postoperative DVT prophylaxis with Xarelto --Norco 5-325 mg 1-2 tablets q4h PRN moderate/severe pain --Morphine 0.5-1.0 mg  --Robaxin 500 mg PO q6h PRN muscle spasms --PT/OT recommending home health, continue PT efforts while inpatient --Outpatient follow-up with orthopedics  Essential hypertension --Irbesartan 75 mg p.o. daily (telmesartan outpatient)  Chronic diastolic congestive heart failure, compensated --Strict I's and O's and daily weights  CKD stage IIIb --Creatinine 1.22, stable.  Asthma --Breo Ellipta 1 puff daily (on  symbicort outpatient) --Singulair 10 mg p.o. nightly --Albuterol every 6 hours as needed wheezing/shortness of breath  Hyperlipidemia: Crestor 5 mg p.o. daily  GERD: Continue PPI  Iron deficiency anemia:  --Ferrous sulfate 325 mg p.o. 3 times daily --Repeat CBC in the a.m.   DVT prophylaxis: rivaroxaban (XARELTO) tablet 10 mg Start: 04/03/21 1000 SCDs Start: 04/02/21 1748 Place TED hose Start: 04/02/21 1748 SCDs Start: 03/31/21 2320 rivaroxaban (XARELTO) tablet 10 mg    Code Status: Full Code Family Communication: Updated husband present at bedside this morning  Disposition Plan:  Level of care: Med-Surg Status is: Inpatient  Remains inpatient appropriate because: Continue PT/OT efforts, anticipate likely discharge home tomorrow with home health    Consultants:  Orthopedics  Procedures:  Total hip arthroplasty, Dr. Ihor Gully 11/22  Antimicrobials:  Perioperative cefazolin   Subjective: Patient seen examined bedside, resting comfortably.  Sitting in bedside chair.  Awaiting to work with PT/OT this morning.  Husband present at bedside.  Pain controlled.  Hopeful for discharge home tomorrow if she does well with physical therapy today.  No other questions or concerns at this time.  Denies headache, no dizziness, no chest pain, no palpitations, no abdominal pain, no fever/chills/night sweats, no nausea/vomiting/diarrhea.  No acute events overnight per nursing staff.  Objective: Vitals:   04/03/21 0429 04/03/21 0813 04/03/21 0815 04/03/21 1010  BP: (!) 121/55   (!) 121/54  Pulse: 72   90  Resp: 16   18  Temp: 97.7 F (36.5 C)     TempSrc: Oral     SpO2: 98% 96% 96% 92%  Weight:      Height:        Intake/Output Summary (Last 24 hours) at 04/03/2021 1651 Last data filed  at 04/03/2021 1002 Gross per 24 hour  Intake 1379.85 ml  Output 450 ml  Net 929.85 ml   Filed Weights   03/31/21 1424  Weight: 63.5 kg    Examination:  General exam: Appears calm and  comfortable  Respiratory system: Clear to auscultation. Respiratory effort normal.  On room air Cardiovascular system: S1 & S2 heard, RRR. No JVD, murmurs, rubs, gallops or clicks. No pedal edema. Gastrointestinal system: Abdomen is nondistended, soft and nontender. No organomegaly or masses felt. Normal bowel sounds heard. Central nervous system: Alert and oriented. No focal neurological deficits. Extremities: Symmetric 5 x 5 power.  Surgical dressing noted, clean/dry/intact Skin: No rashes, lesions or ulcers Psychiatry: Judgement and insight appear normal. Mood & affect appropriate.     Data Reviewed: I have personally reviewed following labs and imaging studies  CBC: Recent Labs  Lab 03/31/21 1443 04/01/21 0209 04/03/21 0312  WBC 9.2 5.4 6.7  NEUTROABS 7.2  --   --   HGB 12.0 10.4* 10.0*  HCT 37.8 32.6* 31.2*  MCV 92.9 92.6 91.8  PLT 248 179 644   Basic Metabolic Panel: Recent Labs  Lab 03/31/21 1443 04/01/21 0209 04/03/21 0312  NA 136 135 127*  K 3.8 4.4 4.2  CL 98 98 93*  CO2 28 29 25   GLUCOSE 164* 137* 164*  BUN 20 19 22   CREATININE 1.49* 1.33* 1.22*  CALCIUM 8.8* 8.6* 7.6*   GFR: Estimated Creatinine Clearance: 29.8 mL/min (A) (by C-G formula based on SCr of 1.22 mg/dL (H)). Liver Function Tests: No results for input(s): AST, ALT, ALKPHOS, BILITOT, PROT, ALBUMIN in the last 168 hours. No results for input(s): LIPASE, AMYLASE in the last 168 hours. No results for input(s): AMMONIA in the last 168 hours. Coagulation Profile: Recent Labs  Lab 04/01/21 0209  INR 1.1   Cardiac Enzymes: No results for input(s): CKTOTAL, CKMB, CKMBINDEX, TROPONINI in the last 168 hours. BNP (last 3 results) No results for input(s): PROBNP in the last 8760 hours. HbA1C: No results for input(s): HGBA1C in the last 72 hours. CBG: No results for input(s): GLUCAP in the last 168 hours. Lipid Profile: No results for input(s): CHOL, HDL, LDLCALC, TRIG, CHOLHDL, LDLDIRECT in the  last 72 hours. Thyroid Function Tests: No results for input(s): TSH, T4TOTAL, FREET4, T3FREE, THYROIDAB in the last 72 hours. Anemia Panel: No results for input(s): VITAMINB12, FOLATE, FERRITIN, TIBC, IRON, RETICCTPCT in the last 72 hours. Sepsis Labs: No results for input(s): PROCALCITON, LATICACIDVEN in the last 168 hours.  Recent Results (from the past 240 hour(s))  Resp Panel by RT-PCR (Flu A&B, Covid) Nasopharyngeal Swab     Status: None   Collection Time: 03/31/21  4:44 PM   Specimen: Nasopharyngeal Swab; Nasopharyngeal(NP) swabs in vial transport medium  Result Value Ref Range Status   SARS Coronavirus 2 by RT PCR NEGATIVE NEGATIVE Final    Comment: (NOTE) SARS-CoV-2 target nucleic acids are NOT DETECTED.  The SARS-CoV-2 RNA is generally detectable in upper respiratory specimens during the acute phase of infection. The lowest concentration of SARS-CoV-2 viral copies this assay can detect is 138 copies/mL. A negative result does not preclude SARS-Cov-2 infection and should not be used as the sole basis for treatment or other patient management decisions. A negative result may occur with  improper specimen collection/handling, submission of specimen other than nasopharyngeal swab, presence of viral mutation(s) within the areas targeted by this assay, and inadequate number of viral copies(<138 copies/mL). A negative result must be combined with clinical  observations, patient history, and epidemiological information. The expected result is Negative.  Fact Sheet for Patients:  EntrepreneurPulse.com.au  Fact Sheet for Healthcare Providers:  IncredibleEmployment.be  This test is no t yet approved or cleared by the [place] FDA and  has been authorized for detection and/or diagnosis of SARS-CoV-2 by FDA under an Emergency Use Authorization (EUA). This EUA will remain  in effect (meaning this test can be used) for the duration of  the COVID-19 declaration under Section 564(b)(1) of the Act, 21 U.S.C.section 360bbb-3(b)(1), unless the authorization is terminated  or revoked sooner.       Influenza A by PCR NEGATIVE NEGATIVE Final   Influenza B by PCR NEGATIVE NEGATIVE Final    Comment: (NOTE) The Xpert Xpress SARS-CoV-2/FLU/RSV plus assay is intended as an aid in the diagnosis of influenza from Nasopharyngeal swab specimens and should not be used as a sole basis for treatment. Nasal washings and aspirates are unacceptable for Xpert Xpress SARS-CoV-2/FLU/RSV testing.  Fact Sheet for Patients: EntrepreneurPulse.com.au  Fact Sheet for Healthcare Providers: IncredibleEmployment.be  This test is not yet approved or cleared by the [place] FDA and has been authorized for detection and/or diagnosis of SARS-CoV-2 by FDA under an Emergency Use Authorization (EUA). This EUA will remain in effect (meaning this test can be used) for the duration of the COVID-19 declaration under Section 564(b)(1) of the Act, 21 U.S.C. section 360bbb-3(b)(1), unless the authorization is terminated or revoked.  Performed at [facility], [address], [place], [place] [address]   Surgical pcr screen     Status: None   Collection Time: 04/01/21  8:41 PM   Specimen: Nasal Mucosa; Nasal Swab  Result Value Ref Range Status   MRSA, PCR NEGATIVE NEGATIVE Final   Staphylococcus aureus NEGATIVE NEGATIVE Final    Comment: (NOTE) The Xpert SA Assay (FDA approved for NASAL specimens in patients 70 years of age and older), is one component of a comprehensive surveillance program. It is not intended to diagnose infection nor to guide or monitor treatment. Performed at [facility], [place] [address], [place], [place] [address]          Radiology Studies: DG Pelvis Portable  Result Date: 04/02/2021 CLINICAL DATA:  Status post right hip arthroplasty EXAM: PORTABLE PELVIS 1-2  VIEWS COMPARISON:  12/03/2017 FINDINGS: There is interval right hip arthroplasty. There is previous left hip arthroplasty. No fracture or dislocation is seen. There are pockets of air in the soft tissues in right hip from recent surgery. IMPRESSION: Interval right hip arthroplasty. No fracture is seen. Previous left hip arthroplasty. Electronically Signed   By: Elmer Picker M.D.   On: 04/02/2021 16:33   DG C-Arm 1-60 Min-No Report  Result Date: 04/02/2021 Fluoroscopy was utilized by the requesting physician.  No radiographic interpretation.   DG C-Arm 1-60 Min-No Report  Result Date: 04/02/2021 Fluoroscopy was utilized by the requesting physician.  No radiographic interpretation.   DG HIP UNILAT WITH PELVIS 2-3 VIEWS RIGHT  Result Date: 04/02/2021 CLINICAL DATA:  Right hip arthroplasty EXAM: DG HIP (WITH OR WITHOUT PELVIS) 2-3V RIGHT COMPARISON:  03/31/2021 FINDINGS: 5 fluoroscopic images are obtained during the performance of the procedure and are provided for interpretation only. The subcapital right femoral neck fracture seen previously is identified. Subsequent placement of a right hip arthroplasty in the expected position. Please refer to operative report. FLUOROSCOPY TIME:  4 seconds IMPRESSION: 1. Right hip arthroplasty as above. Electronically Signed   By: Diana Eves.D.  On: 04/02/2021 15:55        Scheduled Meds:  docusate sodium  100 mg Oral BID   ferrous sulfate  325 mg Oral TID PC   fluticasone  1 spray Each Nare Daily   fluticasone furoate-vilanterol  1 puff Inhalation Daily   irbesartan  75 mg Oral Daily   loratadine  10 mg Oral Daily   montelukast  10 mg Oral QHS   pantoprazole  40 mg Oral Daily   pramipexole  0.375 mg Oral QHS   rivaroxaban  10 mg Oral Daily   rosuvastatin  5 mg Oral QHS   senna  1 tablet Oral BID   Continuous Infusions:  sodium chloride 75 mL/hr at 04/02/21 1814   methocarbamol (ROBAXIN) IV 500 mg (04/02/21 1557)     LOS: 3 days     Time spent: 41 minutes spent on chart review, discussion with nursing staff, consultants, updating family and interview/physical exam; more than 50% of that time was spent in counseling and/or coordination of care.    Linsy Ehresman J [place] DO [facility] Available via Epic secure chat 7am-7pm After these hours, please refer to coverage provider listed on [email or web address] 04/03/2021, 4:51 PM

## 2021-04-03 NOTE — Progress Notes (Signed)
Physical Therapy Treatment Patient Details Name: [name] MRN: [id number] DOB: [birth year] Today's Date: 04/03/2021   History of Present Illness [name] is a 87 y.o. female with a history of DVT on chronic anticoagulation, hyperlipidemia, hypertension, history of breast cancer, stage II chronic kidney disease, asthma, rheumatoid arthritis. Patient [place] and sustained Right hip displaced femoral neck fracture. She is now s/p R THA via anterior approach.    PT Comments    Pt ambulated short distance in hallway and assisted back to bed.  Pt reports her pain is a little better this afternoon however reports the pain meds make her feel bad.  Pt encouraged to discuss pain management with RN, and RN notified of pt's current desire for only tramadol.     Recommendations for follow up therapy are one component of a multi-disciplinary discharge planning process, led by the attending physician.  Recommendations may be updated based on patient status, additional functional criteria and insurance authorization.  Follow Up Recommendations  Home health PT     Assistance Recommended at Discharge Frequent or constant Supervision/Assistance  Equipment Recommendations  None recommended by PT    Recommendations for Other Services       Precautions / Restrictions Precautions Precautions: Fall Precaution Comments: pain in shoulder/neck, hx of R shoulder replacement Restrictions Weight Bearing Restrictions: No     Mobility  Bed Mobility Overal bed mobility: Needs Assistance Bed Mobility: Supine to Sit     Supine to sit: Min assist     General bed mobility comments: assist required for R LE    Transfers Overall transfer level: Needs assistance Equipment used: Rolling walker (2 wheels) Transfers: Sit to/from Stand Sit to Stand: Min guard           General transfer comment: verbal cues for UE and LE positioning, pt able to self assist with UEs     Ambulation/Gait Ambulation/Gait assistance: Min guard Gait Distance (Feet): 80 Feet Assistive device: Rolling walker (2 wheels) Gait Pattern/deviations: Decreased stride length;Step-to pattern;Decreased stance time - right Gait velocity: decr     General Gait Details: verbal cues for sequence, RW positioning, posture, step length, pt reports pain 8/10 with mobility (10/10 this morning)   Stairs             Wheelchair Mobility    Modified Rankin (Stroke Patients Only)       Balance Overall balance assessment: Needs assistance         Standing balance support: No upper extremity supported Standing balance-Leahy Scale: Fair                              Cognition Arousal/Alertness: Awake/alert Behavior During Therapy: WFL for tasks assessed/performed Overall Cognitive Status: Within Functional Limits for tasks assessed                                          Exercises Total Joint Exercises Ankle Circles/Pumps: AROM;Both;10 reps Quad Sets: AROM;Right;10 reps Heel Slides: AAROM;Right;10 reps Hip ABduction/ADduction: AAROM;Right;10 reps    General Comments        Pertinent Vitals/Pain Pain Assessment: 0-10 Pain Score: 8  Pain Location: right shoulder, back, R hip Pain Descriptors / Indicators: Grimacing;Sore Pain Intervention(s): Monitored during session;Repositioned;Ice applied;Heat applied (ice to shoulder and hip, heat to back)    Home Living Family/patient expects to be  discharged to:: Private residence Living Arrangements: Spouse/significant other Available Help at Discharge: Available 24 hours/day;Family Type of Home: House Home Access: Stairs to enter Entrance Stairs-Rails: None Entrance Stairs-Number of Steps: 2 and threshold   Home Layout: One level Home Equipment: [place] - single Barista (2 wheels);Shower seat;BSC/3in1;Tub bench;Adaptive equipment      Prior Function            PT Goals  (current goals can now be found in the care plan section) Acute Rehab PT Goals PT Goal Formulation: With patient Time For Goal Achievement: 04/10/21 Potential to Achieve Goals: Good Progress towards PT goals: Progressing toward goals    Frequency    Min 5X/week      PT Plan Current plan remains appropriate    Co-evaluation              AM-PAC PT "6 Clicks" Mobility   Outcome Measure  Help needed turning from your back to your side while in a flat bed without using bedrails?: A Little Help needed moving from lying on your back to sitting on the side of a flat bed without using bedrails?: A Little Help needed moving to and from a bed to a chair (including a wheelchair)?: A Little Help needed standing up from a chair using your arms (e.g., wheelchair or bedside chair)?: A Little Help needed to walk in hospital room?: A Little Help needed climbing 3-5 steps with a railing? : A Lot 6 Click Score: 17    End of Session Equipment Utilized During Treatment: Gait belt Activity Tolerance: Patient tolerated treatment well Patient left: in bed;with call bell/phone within reach;with bed alarm set Nurse Communication: Mobility status PT Visit Diagnosis: Other abnormalities of gait and mobility (R26.89)     Time: 5537-4827 PT Time Calculation (min) (ACUTE ONLY): 24 min  Charges:  $Gait Training: 23-37 mins                    Jannette Spanner PT, DPT Acute Rehabilitation Services Pager: [phone] Office: [place] 04/03/2021, 4:26 PM

## 2021-04-03 NOTE — Progress Notes (Signed)
Subjective: 1 Day Post-Op Procedure(s) (LRB): TOTAL HIP ARTHROPLASTY ANTERIOR APPROACH (Right) Patient reports pain as mild.   Patient seen in rounds by Dr. Alvan Dame. Patient is resting in bed on exam this morning. Denies CP, SHOB, N/V.  We will start therapy today.   Objective: Vital signs in last 24 hours: Temp:  [97.7 F (36.5 C)-98.9 F (37.2 C)] 97.7 F (36.5 C) (11/23 0429) Pulse Rate:  [72-103] 72 (11/23 0429) Resp:  [10-20] 16 (11/23 0429) BP: (95-149)/(42-59) 121/55 (11/23 0429) SpO2:  [91 %-100 %] 98 % (11/23 0429)  Intake/Output from previous day:  Intake/Output Summary (Last 24 hours) at 04/03/2021 0736 Last data filed at 04/03/2021 0600 Gross per 24 hour  Intake 2439.85 ml  Output 1450 ml  Net 989.85 ml     Intake/Output this shift: No intake/output data recorded.  Labs: Recent Labs    03/31/21 1443 04/01/21 0209 04/03/21 0312  HGB 12.0 10.4* 10.0*   Recent Labs    04/01/21 0209 04/03/21 0312  WBC 5.4 6.7  RBC 3.52* 3.40*  HCT 32.6* 31.2*  PLT 179 157   Recent Labs    04/01/21 0209 04/03/21 0312  NA 135 127*  K 4.4 4.2  CL 98 93*  CO2 29 25  BUN 19 22  CREATININE 1.33* 1.22*  GLUCOSE 137* 164*  CALCIUM 8.6* 7.6*   Recent Labs    04/01/21 0209  INR 1.1    Exam: General - Patient is Alert and Oriented Extremity - Neurologically intact Sensation intact distally Intact pulses distally Dorsiflexion/Plantar flexion intact Dressing - dressing C/D/I Motor Function - intact, moving foot and toes well on exam.   Past Medical History:  Diagnosis Date   Anemia    after hysterectomy   Angio-edema    Asthma, severe persistent    pulmologist-- dr Lynford Citizen   Cancer [facility])    Breast cancer on right   Chronic kidney disease, stage II (mild)    DDD (degenerative disc disease), cervical    Diverticulitis    Diverticulosis yrs ago   hx of    Edema, lower extremity    occ both legs swell   GERD (gastroesophageal reflux disease)     Headache    sinus headaches   Heart murmur    no problems - per pt   History of breast cancer 1990 left mastectomy also   1989  S/P   RIGHT MASTECTOMY ;  NO CHEMORADIATION //   NO RECURRENCE   History of DVT of lower extremity 5 yrs ago   right leg   History of shingles    Hyperlipidemia    Internal hemorrhoid    Leg ulcer, left (HCC)    Osteoporosis, unspecified    Knee and hip osteoarthritis bilaterally   Perennial allergic rhinitis    Peripheral vascular disease (HCC)    Pneumonia    Restless legs syndrome (RLS)    Rheumatoid arthritis (HCC)    hands   Thyroid nodule    followed by dr perrini yearly, no current problam   Unspecified essential hypertension    Urticaria     Assessment/Plan: 1 Day Post-Op Procedure(s) (LRB): TOTAL HIP ARTHROPLASTY ANTERIOR APPROACH (Right) Principal Problem:   Femur fracture, right (HCC) Active Problems:   GERD   History of DVT of lower extremity   CKD (chronic kidney disease) stage 3, GFR 30-59 ml/min (HCC)   CAD (coronary artery disease), native coronary artery   Chronic diastolic CHF (congestive heart failure) ([place])   Essential  hypertension  Estimated body mass index is 23.3 kg/m as calculated from the following:   Height as of this encounter: 5\' 5"  (1.651 m).   Weight as of this encounter: 63.5 kg. Advance diet Up with therapy  DVT Prophylaxis - Xarelto Weight bearing as tolerated.  Hgb stable at 10 this AM.  Plan to get up with PT today to work on mobility and get recommendations for discharge disposition. Ready for discharge from an orthopaedic standpoint when meeting goals with PT. She is hopeful for discharge home tomorrow. Discharge per medicine team.  Will print rx in case of d/c to SNF.   Griffith Citron, PA-C Orthopedic Surgery [phone] 04/03/2021, 7:36 AM

## 2021-04-04 DIAGNOSIS — I2510 Atherosclerotic heart disease of native coronary artery without angina pectoris: Secondary | ICD-10-CM | POA: Diagnosis not present

## 2021-04-04 DIAGNOSIS — S72044A Nondisplaced fracture of base of neck of right femur, initial encounter for closed fracture: Secondary | ICD-10-CM | POA: Diagnosis not present

## 2021-04-04 DIAGNOSIS — N183 Chronic kidney disease, stage 3 unspecified: Secondary | ICD-10-CM | POA: Diagnosis not present

## 2021-04-04 DIAGNOSIS — I5032 Chronic diastolic (congestive) heart failure: Secondary | ICD-10-CM | POA: Diagnosis not present

## 2021-04-04 LAB — BASIC METABOLIC PANEL
Anion gap: 11 (ref 5–15)
BUN: 32 mg/dL — ABNORMAL HIGH (ref 8–23)
CO2: 26 mmol/L (ref 22–32)
Calcium: 8.2 mg/dL — ABNORMAL LOW (ref 8.9–10.3)
Chloride: 94 mmol/L — ABNORMAL LOW (ref 98–111)
Creatinine, Ser: 1.5 mg/dL — ABNORMAL HIGH (ref 0.44–1.00)
GFR, Estimated: 34 mL/min — ABNORMAL LOW (ref >60)
Glucose, Bld: 146 mg/dL — ABNORMAL HIGH (ref 70–99)
Potassium: 4.5 mmol/L (ref 3.5–5.1)
Sodium: 131 mmol/L — ABNORMAL LOW (ref 135–145)

## 2021-04-04 LAB — CBC
HCT: 34.6 % — ABNORMAL LOW (ref 36.0–46.0)
Hemoglobin: 11.2 g/dL — ABNORMAL LOW (ref 12.0–15.0)
MCH: 29.5 pg (ref 26.0–34.0)
MCHC: 32.4 g/dL (ref 30.0–36.0)
MCV: 91.1 fL (ref 80.0–100.0)
Platelets: 272 10*3/uL (ref 150–400)
RBC: 3.8 MIL/uL — ABNORMAL LOW (ref 3.87–5.11)
RDW: 12.4 % (ref 11.5–15.5)
WBC: 10.1 10*3/uL (ref 4.0–10.5)
nRBC: 0 % (ref 0.0–0.2)

## 2021-04-04 NOTE — Plan of Care (Signed)
  Problem: Pain Managment: Goal: General experience of comfort will improve Outcome: Progressing   Problem: Safety: Goal: Ability to remain free from injury will improve Outcome: Progressing   

## 2021-04-04 NOTE — Progress Notes (Signed)
Subjective: 2 Days Post-Op Procedure(s) (LRB): TOTAL HIP ARTHROPLASTY ANTERIOR APPROACH (Right) Patient reports pain as  much better in hip but with right shoulder pain .  Injured shoulder when she fell and x-rays showed no fracture or acute bony injury  Objective: Vital signs in last 24 hours: Temp:  [97.8 F (36.6 C)] 97.8 F (36.6 C) (11/24 0540) Pulse Rate:  [74-90] 74 (11/24 0540) Resp:  [16-18] 16 (11/24 0540) BP: (121-160)/(54-75) 160/75 (11/24 0540) SpO2:  [92 %-100 %] 100 % (11/24 0540)  Intake/Output from previous day: 11/23 0701 - 11/24 0700 In: 240 [P.O.:240] Out: -  Intake/Output this shift: No intake/output data recorded.  Recent Labs    04/03/21 0312 04/04/21 0323  HGB 10.0* 11.2*   Recent Labs    04/03/21 0312 04/04/21 0323  WBC 6.7 10.1  RBC 3.40* 3.80*  HCT 31.2* 34.6*  PLT 157 272   Recent Labs    04/03/21 0312 04/04/21 0323  NA 127* 131*  K 4.2 4.5  CL 93* 94*  CO2 25 26  BUN 22 32*  CREATININE 1.22* 1.50*  GLUCOSE 164* 146*  CALCIUM 7.6* 8.2*   No results for input(s): LABPT, INR in the last 72 hours.  Dorsiflexion/Plantar flexion intact No cellulitis present Compartment soft   Assessment/Plan: 2 Days Post-Op Procedure(s) (LRB): TOTAL HIP ARTHROPLASTY ANTERIOR APPROACH (Right) Up with therapy Ice to shoulder Dispo based on progress with therapy   Gaynelle Arabian 04/04/2021, 8:58 AM

## 2021-04-04 NOTE — Progress Notes (Signed)
Physical Therapy Treatment Patient Details Name: [name] MRN: [id number] DOB: [birth year] Today's Date: 04/04/2021   History of Present Illness Marzell Abdon is a 87 y.o. female with a history of DVT on chronic anticoagulation, hyperlipidemia, hypertension, history of breast cancer, stage II chronic kidney disease, asthma, rheumatoid arthritis. Patient [place] and sustained Right hip displaced femoral neck fracture. She is now s/p R THA via anterior approach.    PT Comments    Pt very motivated and progressing steadily with mobility but with reports of increased R shoulder pain but decreased R hip pain this am.    Recommendations for follow up therapy are one component of a multi-disciplinary discharge planning process, led by the attending physician.  Recommendations may be updated based on patient status, additional functional criteria and insurance authorization.  Follow Up Recommendations  Home health PT     Assistance Recommended at Discharge Frequent or constant Supervision/Assistance  Equipment Recommendations  None recommended by PT    Recommendations for Other Services       Precautions / Restrictions Precautions Precautions: Fall Precaution Comments: pain in shoulder/neck, hx of R shoulder replacement Restrictions Weight Bearing Restrictions: No     Mobility  Bed Mobility Overal bed mobility: Needs Assistance Bed Mobility: Sit to Supine       Sit to supine: Min assist;Mod assist   General bed mobility comments: cues for sequence and use of L LE to self assist    Transfers Overall transfer level: Needs assistance Equipment used: Rolling walker (2 wheels) Transfers: Sit to/from Stand Sit to Stand: Min guard           General transfer comment: verbal cues for UE and LE positioning, pt able to self assist with UEs    Ambulation/Gait Ambulation/Gait assistance: Min guard Gait Distance (Feet): 120 Feet Assistive device: Rolling walker (2  wheels) Gait Pattern/deviations: Decreased stride length;Decreased stance time - right;Step-to pattern;Step-through pattern;Shuffle;Trunk flexed Gait velocity: decr     General Gait Details: cues for posture, position from RW and initial sequence   Stairs             Wheelchair Mobility    Modified Rankin (Stroke Patients Only)       Balance Overall balance assessment: Needs assistance Sitting-balance support: No upper extremity supported Sitting balance-Leahy Scale: Fair     Standing balance support: No upper extremity supported Standing balance-Leahy Scale: Fair                              Cognition Arousal/Alertness: Awake/alert Behavior During Therapy: WFL for tasks assessed/performed Overall Cognitive Status: Within Functional Limits for tasks assessed                                          Exercises Total Joint Exercises Ankle Circles/Pumps: AROM;Both;20 reps;Supine Quad Sets: AROM;Right;10 reps;Supine Heel Slides: AAROM;Right;20 reps;Supine Hip ABduction/ADduction: AAROM;Right;15 reps;Supine    General Comments        Pertinent Vitals/Pain Pain Assessment: 0-10 Pain Score: 7  Pain Location: right shoulder, back, R hip Pain Descriptors / Indicators: Grimacing;Sore Pain Intervention(s): Limited activity within patient's tolerance;Monitored during session;Premedicated before session    Home Living                          Prior Function  PT Goals (current goals can now be found in the care plan section) Acute Rehab PT Goals PT Goal Formulation: With patient Time For Goal Achievement: 04/10/21 Potential to Achieve Goals: Good Progress towards PT goals: Progressing toward goals    Frequency    Min 5X/week      PT Plan Current plan remains appropriate    Co-evaluation              AM-PAC PT "6 Clicks" Mobility   Outcome Measure  Help needed turning from your back to your  side while in a flat bed without using bedrails?: A Little Help needed moving from lying on your back to sitting on the side of a flat bed without using bedrails?: A Little Help needed moving to and from a bed to a chair (including a wheelchair)?: A Little Help needed standing up from a chair using your arms (e.g., wheelchair or bedside chair)?: A Little Help needed to walk in hospital room?: A Little Help needed climbing 3-5 steps with a railing? : A Lot 6 Click Score: 17    End of Session Equipment Utilized During Treatment: Gait belt Activity Tolerance: Patient tolerated treatment well Patient left: in bed;with call bell/phone within reach;with bed alarm set Nurse Communication: Mobility status PT Visit Diagnosis: Other abnormalities of gait and mobility (R26.89)     Time: 1130-1210 PT Time Calculation (min) (ACUTE ONLY): 40 min  Charges:  $Gait Training: 23-37 mins $Therapeutic Exercise: 8-22 mins                     [place] Pager [phone] Office [phone]    Midori Dado 04/04/2021, 4:22 PM

## 2021-04-04 NOTE — Progress Notes (Signed)
PROGRESS NOTE    [name]  EZM:[id number] DOB: 03-Nov-1932 DOA: 03/31/2021 PCP: Crist Infante, MD    Brief Narrative:  [name] is a 87 year old female with past medical history significant for DVT on Xarelto, hyperlipidemia, essential hypertension, breast cancer, CKD stage IIIb, asthma, rheumatoid arthritis who presents to [facility] ED on 11/20 following mechanical fall while putting [place] decorations at [employer].  Patient developed severe right-sided pain in her right hip and unable to bear weight.  Imaging in the ED notable for right femoral neck fracture.  Orthopedics was consulted.  Hospitalist service consulted for further evaluation and management.   Assessment & Plan:   Principal Problem:   Femur fracture, right (HCC) Active Problems:   GERD   History of DVT of lower extremity   CKD (chronic kidney disease) stage 3, GFR 30-59 ml/min (HCC)   CAD (coronary artery disease), native coronary artery   Chronic diastolic CHF (congestive heart failure) ([place])   Essential hypertension   Right femoral neck fracture s/p total hip arthroplasty Patient presenting to ED following mechanical fall with acute right hip pain with inability ambulate.  Right hip x-ray with subcapital right femoral neck fracture with mild impaction and valgus angulation.  Orthopedics was consulted and patient underwent total hip arthroplasty on 11/23 by Dr. Alvan Dame. --Continue postoperative DVT prophylaxis with Xarelto --Norco 5-325 mg 1-2 tablets q4h PRN moderate/severe pain --Morphine 0.5-1.0 mg  --Robaxin 500 mg PO q6h PRN muscle spasms --PT/OT recommending home health, continue therapy efforts while inpatient --Outpatient follow-up with orthopedics  Right shoulder pain secondary to contusion from fall Continues to complain of right shoulder pain.  X-rays negative for fracture/bony injury. --Continue pain control, ice to affected area --PT/OT  Essential hypertension --Irbesartan 75 mg p.o. daily (telmesartan  outpatient)  Chronic diastolic congestive heart failure, compensated --Strict I's and O's and daily weights  CKD stage IIIb --Creatinine 1.22, stable.  Asthma --Breo Ellipta 1 puff daily (on symbicort outpatient) --Singulair 10 mg p.o. nightly --Albuterol every 6 hours as needed wheezing/shortness of breath  Hyperlipidemia: Crestor 5 mg p.o. daily  GERD: Continue PPI  Iron deficiency anemia:  --Ferrous sulfate 325 mg p.o. 3 times daily --Repeat CBC in the a.m.   DVT prophylaxis: rivaroxaban (XARELTO) tablet 10 mg Start: 04/03/21 1000 SCDs Start: 04/02/21 1748 Place TED hose Start: 04/02/21 1748 SCDs Start: 03/31/21 2320 rivaroxaban (XARELTO) tablet 10 mg    Code Status: Full Code Family Communication: Updated husband present at bedside this morning  Disposition Plan:  Level of care: Med-Surg Status is: Inpatient  Remains inpatient appropriate because: Continue PT/OT efforts, anticipate likely discharge home with home health when able to ambulate more    Consultants:  Orthopedics  Procedures:  Total hip arthroplasty, Dr. Alvan Dame 11/22  Antimicrobials:  Perioperative cefazolin   Subjective: Patient seen examined bedside, resting comfortably.  Lying in bed.  States therapy did not go well yesterday afternoon.  Complains of continued right shoulder pain with ice pack in place.  Seen by orthopedics, Dr. Maureen Ralphs this morning.  States unable to discharge home today due to pain and poor effort yesterday with therapy in the afternoon.  No other questions or concerns at this time.  Denies headache, no dizziness, no chest pain, no palpitations, no abdominal pain, no fever/chills/night sweats, no nausea/vomiting/diarrhea.  No acute events overnight per nursing staff.  Objective: Vitals:   04/03/21 0815 04/03/21 1010 04/03/21 2045 04/04/21 0540  BP:  (!) 121/54 (!) 143/54 (!) 160/75  Pulse:  90 79 74  Resp:  18 18 16   Temp:   97.8 F (36.6 C) 97.8 F (36.6 C)  TempSrc:    Oral Oral  SpO2: 96% 92% 92% 100%  Weight:      Height:        Intake/Output Summary (Last 24 hours) at 04/04/2021 1055 Last data filed at 04/04/2021 0900 Gross per 24 hour  Intake 240 ml  Output --  Net 240 ml   Filed Weights   03/31/21 1424  Weight: 63.5 kg    Examination:  General exam: Appears calm and comfortable  Respiratory system: Clear to auscultation. Respiratory effort normal.  On room air Cardiovascular system: S1 & S2 heard, RRR. No JVD, murmurs, rubs, gallops or clicks. No pedal edema. Gastrointestinal system: Abdomen is nondistended, soft and nontender. No organomegaly or masses felt. Normal bowel sounds heard. Central nervous system: Alert and oriented. No focal neurological deficits. Extremities: Symmetric 5 x 5 power.  Surgical dressing noted, clean/dry/intact, right shoulder with ecchymosis and ice pack in place Skin: No rashes, lesions or ulcers Psychiatry: Judgement and insight appear normal. Mood & affect appropriate.     Data Reviewed: I have personally reviewed following labs and imaging studies  CBC: Recent Labs  Lab 03/31/21 1443 04/01/21 0209 04/03/21 0312 04/04/21 0323  WBC 9.2 5.4 6.7 10.1  NEUTROABS 7.2  --   --   --   HGB 12.0 10.4* 10.0* 11.2*  HCT 37.8 32.6* 31.2* 34.6*  MCV 92.9 92.6 91.8 91.1  PLT 248 179 157 852   Basic Metabolic Panel: Recent Labs  Lab 03/31/21 1443 04/01/21 0209 04/03/21 0312 04/04/21 0323  NA 136 135 127* 131*  K 3.8 4.4 4.2 4.5  CL 98 98 93* 94*  CO2 28 29 25 26   GLUCOSE 164* 137* 164* 146*  BUN 20 19 22  32*  CREATININE 1.49* 1.33* 1.22* 1.50*  CALCIUM 8.8* 8.6* 7.6* 8.2*   GFR: Estimated Creatinine Clearance: 24.2 mL/min (A) (by C-G formula based on SCr of 1.5 mg/dL (H)). Liver Function Tests: No results for input(s): AST, ALT, ALKPHOS, BILITOT, PROT, ALBUMIN in the last 168 hours. No results for input(s): LIPASE, AMYLASE in the last 168 hours. No results for input(s): AMMONIA in the last 168  hours. Coagulation Profile: Recent Labs  Lab 04/01/21 0209  INR 1.1   Cardiac Enzymes: No results for input(s): CKTOTAL, CKMB, CKMBINDEX, TROPONINI in the last 168 hours. BNP (last 3 results) No results for input(s): PROBNP in the last 8760 hours. HbA1C: No results for input(s): HGBA1C in the last 72 hours. CBG: No results for input(s): GLUCAP in the last 168 hours. Lipid Profile: No results for input(s): CHOL, HDL, LDLCALC, TRIG, CHOLHDL, LDLDIRECT in the last 72 hours. Thyroid Function Tests: No results for input(s): TSH, T4TOTAL, FREET4, T3FREE, THYROIDAB in the last 72 hours. Anemia Panel: No results for input(s): VITAMINB12, FOLATE, FERRITIN, TIBC, IRON, RETICCTPCT in the last 72 hours. Sepsis Labs: No results for input(s): PROCALCITON, LATICACIDVEN in the last 168 hours.  Recent Results (from the past 240 hour(s))  Resp Panel by RT-PCR (Flu A&B, Covid) Nasopharyngeal Swab     Status: None   Collection Time: 03/31/21  4:44 PM   Specimen: Nasopharyngeal Swab; Nasopharyngeal(NP) swabs in vial transport medium  Result Value Ref Range Status   SARS Coronavirus 2 by RT PCR NEGATIVE NEGATIVE Final    Comment: (NOTE) SARS-CoV-2 target nucleic acids are NOT DETECTED.  The SARS-CoV-2 RNA is generally detectable in upper respiratory specimens during the acute phase  of infection. The lowest concentration of SARS-CoV-2 viral copies this assay can detect is 138 copies/mL. A negative result does not preclude SARS-Cov-2 infection and should not be used as the sole basis for treatment or other patient management decisions. A negative result may occur with  improper specimen collection/handling, submission of specimen other than nasopharyngeal swab, presence of viral mutation(s) within the areas targeted by this assay, and inadequate number of viral copies(<138 copies/mL). A negative result must be combined with clinical observations, patient history, and epidemiological information.  The expected result is Negative.  Fact Sheet for Patients:  EntrepreneurPulse.com.au  Fact Sheet for Healthcare Providers:  IncredibleEmployment.be  This test is no t yet approved or cleared by the [place] FDA and  has been authorized for detection and/or diagnosis of SARS-CoV-2 by FDA under an Emergency Use Authorization (EUA). This EUA will remain  in effect (meaning this test can be used) for the duration of the COVID-19 declaration under Section 564(b)(1) of the Act, 21 U.S.C.section 360bbb-3(b)(1), unless the authorization is terminated  or revoked sooner.       Influenza A by PCR NEGATIVE NEGATIVE Final   Influenza B by PCR NEGATIVE NEGATIVE Final    Comment: (NOTE) The Xpert Xpress SARS-CoV-2/FLU/RSV plus assay is intended as an aid in the diagnosis of influenza from Nasopharyngeal swab specimens and should not be used as a sole basis for treatment. Nasal washings and aspirates are unacceptable for Xpert Xpress SARS-CoV-2/FLU/RSV testing.  Fact Sheet for Patients: EntrepreneurPulse.com.au  Fact Sheet for Healthcare Providers: IncredibleEmployment.be  This test is not yet approved or cleared by the [place] FDA and has been authorized for detection and/or diagnosis of SARS-CoV-2 by FDA under an Emergency Use Authorization (EUA). This EUA will remain in effect (meaning this test can be used) for the duration of the COVID-19 declaration under Section 564(b)(1) of the Act, 21 U.S.C. section 360bbb-3(b)(1), unless the authorization is terminated or revoked.  Performed at [facility], [address], [place], [place] [address]   Surgical pcr screen     Status: None   Collection Time: 04/01/21  8:41 PM   Specimen: Nasal Mucosa; Nasal Swab  Result Value Ref Range Status   MRSA, PCR NEGATIVE NEGATIVE Final   Staphylococcus aureus NEGATIVE NEGATIVE Final    Comment: (NOTE) The Xpert SA  Assay (FDA approved for NASAL specimens in patients 38 years of age and older), is one component of a comprehensive surveillance program. It is not intended to diagnose infection nor to guide or monitor treatment. Performed at [facility], [place] [address], [place], [place] [address]          Radiology Studies: DG Pelvis Portable  Result Date: 04/02/2021 CLINICAL DATA:  Status post right hip arthroplasty EXAM: PORTABLE PELVIS 1-2 VIEWS COMPARISON:  12/03/2017 FINDINGS: There is interval right hip arthroplasty. There is previous left hip arthroplasty. No fracture or dislocation is seen. There are pockets of air in the soft tissues in right hip from recent surgery. IMPRESSION: Interval right hip arthroplasty. No fracture is seen. Previous left hip arthroplasty. Electronically Signed   By: Elmer Picker M.D.   On: 04/02/2021 16:33   DG C-Arm 1-60 Min-No Report  Result Date: 04/02/2021 Fluoroscopy was utilized by the requesting physician.  No radiographic interpretation.   DG C-Arm 1-60 Min-No Report  Result Date: 04/02/2021 Fluoroscopy was utilized by the requesting physician.  No radiographic interpretation.   DG HIP UNILAT WITH PELVIS 2-3 VIEWS RIGHT  Result Date: 04/02/2021  CLINICAL DATA:  Right hip arthroplasty EXAM: DG HIP (WITH OR WITHOUT PELVIS) 2-3V RIGHT COMPARISON:  03/31/2021 FINDINGS: 5 fluoroscopic images are obtained during the performance of the procedure and are provided for interpretation only. The subcapital right femoral neck fracture seen previously is identified. Subsequent placement of a right hip arthroplasty in the expected position. Please refer to operative report. FLUOROSCOPY TIME:  4 seconds IMPRESSION: 1. Right hip arthroplasty as above. Electronically Signed   By: Randa Ngo M.D.   On: 04/02/2021 15:55        Scheduled Meds:  docusate sodium  100 mg Oral BID   ferrous sulfate  325 mg Oral TID PC   fluticasone  1 spray  Each Nare Daily   fluticasone furoate-vilanterol  1 puff Inhalation Daily   irbesartan  75 mg Oral Daily   loratadine  10 mg Oral Daily   montelukast  10 mg Oral QHS   pantoprazole  40 mg Oral Daily   pramipexole  0.375 mg Oral QHS   rivaroxaban  10 mg Oral Daily   rosuvastatin  5 mg Oral QHS   senna  1 tablet Oral BID   Continuous Infusions:  sodium chloride 75 mL/hr at 04/02/21 1814   methocarbamol (ROBAXIN) IV 500 mg (04/02/21 1557)     LOS: 4 days    Time spent: 39 minutes spent on chart review, discussion with nursing staff, consultants, updating family and interview/physical exam; more than 50% of that time was spent in counseling and/or coordination of care.    Yacine Droz J [place] DO [facility] Available via Epic secure chat 7am-7pm After these hours, please refer to coverage provider listed on [email or web address] 04/04/2021, 10:55 AM

## 2021-04-05 DIAGNOSIS — N183 Chronic kidney disease, stage 3 unspecified: Secondary | ICD-10-CM | POA: Diagnosis not present

## 2021-04-05 DIAGNOSIS — I5032 Chronic diastolic (congestive) heart failure: Secondary | ICD-10-CM | POA: Diagnosis not present

## 2021-04-05 DIAGNOSIS — I2510 Atherosclerotic heart disease of native coronary artery without angina pectoris: Secondary | ICD-10-CM | POA: Diagnosis not present

## 2021-04-05 DIAGNOSIS — S72044A Nondisplaced fracture of base of neck of right femur, initial encounter for closed fracture: Secondary | ICD-10-CM | POA: Diagnosis not present

## 2021-04-05 MED ORDER — SODIUM CHLORIDE 0.9 % IV BOLUS
1000.0000 mL | Freq: Once | INTRAVENOUS | Status: AC
  Administered 2021-04-05: 1000 mL via INTRAVENOUS

## 2021-04-05 NOTE — Plan of Care (Signed)

## 2021-04-05 NOTE — Care Management Important Message (Signed)
Important Message  Patient Details IM Letter placed in Patients room Name: [name] MRN: [id number] Date of Birth: 1933/04/18   Medicare Important Message Given:  Yes     Kerin Salen 04/05/2021, 2:57 PM

## 2021-04-05 NOTE — Progress Notes (Signed)
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  Patient developed severe right-sided pain in her right hip and unable to bear weight.  Imaging in the ED notable for right femoral neck fracture.  Orthopedics was consulted.  Hospitalist service consulted for further evaluation and management.   Assessment & Plan:   Principal Problem:   Femur fracture, right (HCC) Active Problems:   GERD   History of DVT of lower extremity   CKD (chronic kidney disease) stage 3, GFR 30-59 ml/min (HCC)   CAD (coronary artery disease), native coronary artery   Chronic diastolic CHF (congestive heart failure) ([place])   Essential hypertension   Right femoral neck fracture s/p total hip arthroplasty Patient presenting to ED following mechanical fall with acute right hip pain with inability ambulate.  Right hip x-ray with subcapital right femoral neck fracture with mild impaction and valgus angulation.  Orthopedics was consulted and patient underwent total hip arthroplasty on 11/23 by Dr. Alvan Dame. --Continue postoperative DVT prophylaxis with Xarelto --Norco 5-325 mg 1-2 tablets q4h PRN moderate/severe pain --Morphine 0.5-1.0 mg  --Robaxin 500 mg PO q6h PRN muscle spasms --PT/OT recommending home health, continue therapy efforts while inpatient --Outpatient follow-up with orthopedics  Right shoulder pain secondary to contusion from fall Continues to complain of right shoulder pain.  X-rays negative for fracture/bony injury. --Continue pain control, ice to affected area --PT/OT  Essential hypertension --Irbesartan 75 mg p.o. daily (telmesartan  outpatient)  Chronic diastolic congestive heart failure, compensated --Strict I's and O's and daily weights  CKD stage IIIb --Creatinine 1.22, stable.  Asthma --Breo Ellipta 1 puff daily (on symbicort outpatient) --Singulair 10 mg p.o. nightly --Albuterol every 6 hours as needed wheezing/shortness of breath  Hyperlipidemia: Crestor 5 mg p.o. daily  GERD: Continue PPI  Iron deficiency anemia:  --Ferrous sulfate 325 mg p.o. 3 times daily   DVT prophylaxis: rivaroxaban (XARELTO) tablet 10 mg Start: 04/03/21 1000 SCDs Start: 04/02/21 1748 Place TED hose Start: 04/02/21 1748 SCDs Start: 03/31/21 2320 rivaroxaban (XARELTO) tablet 10 mg    Code Status: Full Code Family Communication: No family present at bedside this morning, updated patient spouse at bedside extensively yesterday afternoon  Disposition Plan:  Level of care: Med-Surg Status is: Inpatient  Remains inpatient appropriate because: Continue PT/OT efforts, anticipate likely discharge home with home health when able to ambulate more and pain better controlled    Consultants:  Orthopedics  Procedures:  Total hip arthroplasty, Dr. Alvan Dame 11/22  Antimicrobials:  Perioperative cefazolin   Subjective: Patient seen examined bedside, resting comfortably.  Lying in bed.  No family present at bedside this morning.  Reports physical therapy yesterday afternoon went better but continues with pain to her right shoulder which is limiting her ambulation.  Patient hopeful for further therapy today to hopefully return back home this weekend.  No other questions or concerns at this time.  Denies headache, no dizziness, no chest pain, no palpitations, no abdominal pain, no fever/chills/night sweats, no nausea/vomiting/diarrhea.  No acute events overnight per nursing staff.  Objective: Vitals:   04/04/21 1348 04/04/21 2048 04/05/21 0511 04/05/21 0740  BP: 115/67 139/66 (!) 116/46   Pulse:  86 72  Resp: 17 16 16    Temp: 97.9 F  (36.6 C) 98.2 F (36.8 C) 98.1 F (36.7 C)   TempSrc: Oral Oral Oral   SpO2:  96% 94% 93%  Weight:      Height:        Intake/Output Summary (Last 24 hours) at 04/05/2021 0951 Last data filed at 04/05/2021 0405 Gross per 24 hour  Intake 480 ml  Output 400 ml  Net 80 ml   Filed Weights   03/31/21 1424  Weight: 63.5 kg    Examination:  General exam: Appears calm and comfortable  Respiratory system: Clear to auscultation. Respiratory effort normal.  On room air Cardiovascular system: S1 & S2 heard, RRR. No JVD, murmurs, rubs, gallops or clicks. No pedal edema. Gastrointestinal system: Abdomen is nondistended, soft and nontender. No organomegaly or masses felt. Normal bowel sounds heard. Central nervous system: Alert and oriented. No focal neurological deficits. Extremities: Symmetric 5 x 5 power.  Surgical dressing noted, clean/dry/intact, right shoulder with ecchymosis Skin: No rashes, lesions or ulcers Psychiatry: Judgement and insight appear normal. Mood & affect appropriate.     Data Reviewed: I have personally reviewed following labs and imaging studies  CBC: Recent Labs  Lab 03/31/21 1443 04/01/21 0209 04/03/21 0312 04/04/21 0323  WBC 9.2 5.4 6.7 10.1  NEUTROABS 7.2  --   --   --   HGB 12.0 10.4* 10.0* 11.2*  HCT 37.8 32.6* 31.2* 34.6*  MCV 92.9 92.6 91.8 91.1  PLT 248 179 157 379   Basic Metabolic Panel: Recent Labs  Lab 03/31/21 1443 04/01/21 0209 04/03/21 0312 04/04/21 0323  NA 136 135 127* 131*  K 3.8 4.4 4.2 4.5  CL 98 98 93* 94*  CO2 28 29 25 26   GLUCOSE 164* 137* 164* 146*  BUN 20 19 22  32*  CREATININE 1.49* 1.33* 1.22* 1.50*  CALCIUM 8.8* 8.6* 7.6* 8.2*   GFR: Estimated Creatinine Clearance: 24.2 mL/min (A) (by C-G formula based on SCr of 1.5 mg/dL (H)). Liver Function Tests: No results for input(s): AST, ALT, ALKPHOS, BILITOT, PROT, ALBUMIN in the last 168 hours. No results for input(s): LIPASE, AMYLASE in the last 168 hours. No  results for input(s): AMMONIA in the last 168 hours. Coagulation Profile: Recent Labs  Lab 04/01/21 0209  INR 1.1   Cardiac Enzymes: No results for input(s): CKTOTAL, CKMB, CKMBINDEX, TROPONINI in the last 168 hours. BNP (last 3 results) No results for input(s): PROBNP in the last 8760 hours. HbA1C: No results for input(s): HGBA1C in the last 72 hours. CBG: No results for input(s): GLUCAP in the last 168 hours. Lipid Profile: No results for input(s): CHOL, HDL, LDLCALC, TRIG, CHOLHDL, LDLDIRECT in the last 72 hours. Thyroid Function Tests: No results for input(s): TSH, T4TOTAL, FREET4, T3FREE, THYROIDAB in the last 72 hours. Anemia Panel: No results for input(s): VITAMINB12, FOLATE, FERRITIN, TIBC, IRON, RETICCTPCT in the last 72 hours. Sepsis Labs: No results for input(s): PROCALCITON, LATICACIDVEN in the last 168 hours.  Recent Results (from the past 240 hour(s))  Resp Panel by RT-PCR (Flu A&B, Covid) Nasopharyngeal Swab     Status: None   Collection Time: 03/31/21  4:44 PM   Specimen: Nasopharyngeal Swab; Nasopharyngeal(NP) swabs in vial transport medium  Result Value Ref Range Status   SARS Coronavirus 2 by RT PCR NEGATIVE NEGATIVE Final    Comment: (NOTE) SARS-CoV-2 target nucleic acids are NOT DETECTED.  The SARS-CoV-2 RNA is generally detectable in upper respiratory specimens during the acute phase of  infection. The lowest concentration of SARS-CoV-2 viral copies this assay can detect is 138 copies/mL. A negative result does not preclude SARS-Cov-2 infection and should not be used as the sole basis for treatment or other patient management decisions. A negative result may occur with  improper specimen collection/handling, submission of specimen other than nasopharyngeal swab, presence of viral mutation(s) within the areas targeted by this assay, and inadequate number of viral copies(<138 copies/mL). A negative result must be combined with clinical observations,  patient history, and epidemiological information. The expected result is Negative.  Fact Sheet for Patients:  EntrepreneurPulse.com.au  Fact Sheet for Healthcare Providers:  IncredibleEmployment.be  This test is no t yet approved or cleared by the [place] FDA and  has been authorized for detection and/or diagnosis of SARS-CoV-2 by FDA under an Emergency Use Authorization (EUA). This EUA will remain  in effect (meaning this test can be used) for the duration of the COVID-19 declaration under Section 564(b)(1) of the Act, 21 U.S.C.section 360bbb-3(b)(1), unless the authorization is terminated  or revoked sooner.       Influenza A by PCR NEGATIVE NEGATIVE Final   Influenza B by PCR NEGATIVE NEGATIVE Final    Comment: (NOTE) The Xpert Xpress SARS-CoV-2/FLU/RSV plus assay is intended as an aid in the diagnosis of influenza from Nasopharyngeal swab specimens and should not be used as a sole basis for treatment. Nasal washings and aspirates are unacceptable for Xpert Xpress SARS-CoV-2/FLU/RSV testing.  Fact Sheet for Patients: EntrepreneurPulse.com.au  Fact Sheet for Healthcare Providers: IncredibleEmployment.be  This test is not yet approved or cleared by the [place] FDA and has been authorized for detection and/or diagnosis of SARS-CoV-2 by FDA under an Emergency Use Authorization (EUA). This EUA will remain in effect (meaning this test can be used) for the duration of the COVID-19 declaration under Section 564(b)(1) of the Act, 21 U.S.C. section 360bbb-3(b)(1), unless the authorization is terminated or revoked.  Performed at [facility], [address] [place], [place] [address]   Surgical pcr screen     Status: None   Collection Time: 04/01/21  8:41 PM   Specimen: Nasal Mucosa; Nasal Swab  Result Value Ref Range Status   MRSA, PCR NEGATIVE NEGATIVE Final   Staphylococcus aureus NEGATIVE  NEGATIVE Final    Comment: (NOTE) The Xpert SA Assay (FDA approved for NASAL specimens in patients 54 years of age and older), is one component of a comprehensive surveillance program. It is not intended to diagnose infection nor to guide or monitor treatment. Performed at [facility], [place] [address], [place], [place] [address]          Radiology Studies: No results found.      Scheduled Meds:  docusate sodium  100 mg Oral BID   ferrous sulfate  325 mg Oral TID PC   fluticasone  1 spray Each Nare Daily   fluticasone furoate-vilanterol  1 puff Inhalation Daily   irbesartan  75 mg Oral Daily   loratadine  10 mg Oral Daily   montelukast  10 mg Oral QHS   pantoprazole  40 mg Oral Daily   pramipexole  0.375 mg Oral QHS   rivaroxaban  10 mg Oral Daily   rosuvastatin  5 mg Oral QHS   senna  1 tablet Oral BID   Continuous Infusions:  sodium chloride 75 mL/hr at 04/02/21 1814   methocarbamol (ROBAXIN) IV 500 mg (04/02/21 1557)     LOS: 5 days    Time spent:  38 minutes spent on chart review, discussion with nursing staff, consultants, updating family and interview/physical exam; more than 50% of that time was spent in counseling and/or coordination of care.    Kaide Gage J [place] DO [facility] Available via Epic secure chat 7am-7pm After these hours, please refer to coverage provider listed on [email or web address] 04/05/2021, 9:51 AM

## 2021-04-05 NOTE — Progress Notes (Signed)
Pt reported to OT Vonda that her vision has not  been the same since the fall. (See OT note from today).  She had not previously said this to the RN.  Dr [place] notified.  Said to continue to monitor.

## 2021-04-05 NOTE — Plan of Care (Signed)
  Problem: Nutrition: Goal: Adequate nutrition will be maintained Outcome: Progressing   Problem: Coping: Goal: Level of anxiety will decrease Outcome: Progressing   Problem: Pain Managment: Goal: General experience of comfort will improve Outcome: Progressing   

## 2021-04-05 NOTE — Progress Notes (Signed)
Physical Therapy Treatment Patient Details Name: [name] MRN: [id number] DOB: 1932/11/05 Today's Date: 04/05/2021   History of Present Illness [name] is a 87 y.o. female with a history of DVT on chronic anticoagulation, hyperlipidemia, hypertension, history of breast cancer, stage II chronic kidney disease, asthma, rheumatoid arthritis. Patient fell at church and sustained Right hip displaced femoral neck fracture. She is now s/p R THA via anterior approach.    PT Comments    Pt reports feeling very sore today in right shoulder, right hip, and back.  Pt ambulated in hallway and felt unable to attempt steps today.  Pt anticipates d/c home tomorrow, hopefully if pain is better and she can tolerate stairs.    Recommendations for follow up therapy are one component of a multi-disciplinary discharge planning process, led by the attending physician.  Recommendations may be updated based on patient status, additional functional criteria and insurance authorization.  Follow Up Recommendations  Home health PT     Assistance Recommended at Discharge Frequent or constant Supervision/Assistance  Equipment Recommendations  None recommended by PT    Recommendations for Other Services       Precautions / Restrictions Precautions Precautions: Fall Precaution Comments: pain in shoulder/neck, hx of R shoulder ORIF Restrictions Weight Bearing Restrictions: No     Mobility  Bed Mobility Overal bed mobility: Needs Assistance Bed Mobility: Supine to Sit     Supine to sit: Min guard;HOB elevated     General bed mobility comments: increased time, self assist for Rt LE    Transfers Overall transfer level: Needs assistance Equipment used: Rolling walker (2 wheels) Transfers: Sit to/from Stand Sit to Stand: Min guard           General transfer comment: pt able to recall good hand placement, increased time and effort due to pain/sore Rt shoulder/neck     Ambulation/Gait Ambulation/Gait assistance: Min guard Gait Distance (Feet): 160 Feet Assistive device: Rolling walker (2 wheels) Gait Pattern/deviations: Decreased stride length;Decreased stance time - right;Step-through pattern;Trunk flexed Gait velocity: decr     General Gait Details: cues for posture, position from RW, distance to pt tolerance   Stairs             Wheelchair Mobility    Modified Rankin (Stroke Patients Only)       Balance Overall balance assessment: Mild deficits observed, not formally tested                                          Cognition Arousal/Alertness: Awake/alert Behavior During Therapy: WFL for tasks assessed/performed Overall Cognitive Status: Within Functional Limits for tasks assessed                                          Exercises Other Exercises Other Exercises: PROM of shoulder in internal/external rotation, flexion/extension to promote ROM and reduce soreness    General Comments        Pertinent Vitals/Pain Pain Assessment: 0-10 Pain Score: 10-Worst pain ever Pain Location: right shoulder, back, R hip Pain Descriptors / Indicators: Sore Pain Intervention(s): Monitored during session;Repositioned;Ice applied    Home Living                          Prior Function  PT Goals (current goals can now be found in the care plan section) Progress towards PT goals: Progressing toward goals    Frequency    Min 5X/week      PT Plan Current plan remains appropriate    Co-evaluation              AM-PAC PT "6 Clicks" Mobility   Outcome Measure  Help needed turning from your back to your side while in a flat bed without using bedrails?: A Little Help needed moving from lying on your back to sitting on the side of a flat bed without using bedrails?: A Little Help needed moving to and from a bed to a chair (including a wheelchair)?: A Little Help  needed standing up from a chair using your arms (e.g., wheelchair or bedside chair)?: A Little Help needed to walk in hospital room?: A Little Help needed climbing 3-5 steps with a railing? : A Lot 6 Click Score: 17    End of Session Equipment Utilized During Treatment: Gait belt Activity Tolerance: Patient tolerated treatment well Patient left: with call bell/phone within reach;in chair;with family/visitor present Nurse Communication: Mobility status PT Visit Diagnosis: Other abnormalities of gait and mobility (R26.89)     Time: 2094-7096 PT Time Calculation (min) (ACUTE ONLY): 19 min  Charges:  $Gait Training: 8-22 mins           Arlyce Dice, DPT Acute Rehabilitation Services Pager: [phone] Office: [place] 04/05/2021, 3:54 PM

## 2021-04-05 NOTE — Progress Notes (Addendum)
Occupational Therapy Treatment Patient Details Name: [name] MRN: [id number] DOB: 15-Feb-1933 Today's Date: 04/05/2021   History of present illness Nahomi Kobler is a 87 y.o. female with a history of DVT on chronic anticoagulation, hyperlipidemia, hypertension, history of breast cancer, stage II chronic kidney disease, asthma, rheumatoid arthritis. Patient fell at church and sustained Right hip displaced femoral neck fracture. She is now s/p R THA via anterior approach.   OT comments  Patient presents today with complaints of right sided pain from neck, shoulder, side, and hip. Reports shoulder pain has been the most limiting. Therapist assessed shoulder without significant findings other than bruising and mild swelling. Provided PROM to shoulder joint to maintain ROM and promote decreased pain. Patient able to demonstrate management of LB clothing with pseudo toileting task today. She is able to use RUE at elbow/waist level. Patient able to ambulate in room short distance (aiming for bathroom distance) but reports pain and nausea with activity. Patient reports vision has not been the same since her fall and hitting her head - reporting some visual hallucinations, episode of dizziness with eyes closed, and inability to read for prolonged time due to blurriness. Overall patient is min guard for all activity. Recommend frequent supervision at home from spouse for safety. Will continue to follow acutely. Recommend HH OT at discharge. Patient has all needed DME.    Recommendations for follow up therapy are one component of a multi-disciplinary discharge planning process, led by the attending physician.  Recommendations may be updated based on patient status, additional functional criteria and insurance authorization.    Follow Up Recommendations  Home health OT    Assistance Recommended at Discharge Frequent or constant Supervision/Assistance  Equipment Recommendations  None recommended by OT     Recommendations for Other Services      Precautions / Restrictions Precautions Precautions: Fall Precaution Comments: pain in shoulder/neck, hx of R shoulder ORIF Restrictions Weight Bearing Restrictions: No       Mobility Bed Mobility Overal bed mobility: Needs Assistance                  Transfers Overall transfer level: Needs assistance Equipment used: Rolling walker (2 wheels)   Sit to Stand: Min guard           General transfer comment: Min guard with RW to ambulate in room. Limited by pain and became nausous.     Balance Overall balance assessment: Mild deficits observed, not formally tested                                         ADL either performed or assessed with clinical judgement   ADL Overall ADL's : Needs assistance/impaired                         Toilet Transfer: Min guard;BSC/3in1;Rolling walker (2 wheels)   Toileting- Clothing Manipulation and Hygiene: Min guard Toileting - Clothing Manipulation Details (indicate cue type and reason): demonstrated ability to manage clothing today with toileting.     Functional mobility during ADLs: Min guard;Rolling walker (2 wheels)      Extremity/Trunk Assessment              Vision   Additional Comments: REports today that her vision hasn't been the same since her fall. Stating she can't read long before the lines start to blur. She says  she is "seeing things" like water running up the glass on the door last night. That her head was spinning when she closed her eyes one time. Nothing prolonged...   Perception     Praxis      Cognition Arousal/Alertness: Awake/alert Behavior During Therapy: WFL for tasks assessed/performed Overall Cognitive Status: Within Functional Limits for tasks assessed                                            Exercises Other Exercises Other Exercises: PROM of shoulder in internal/external rotation,  flexion/extension to promote ROM and reduce soreness   Shoulder Instructions       General Comments      Pertinent Vitals/ Pain       Pain Assessment: 0-10 Pain Score: 10-Worst pain ever Pain Location: right shoulder, back, R hip Pain Descriptors / Indicators: Grimacing;Sore Pain Intervention(s): Limited activity within patient's tolerance  Home Living                                          Prior Functioning/Environment              Frequency  Min 2X/week        Progress Toward Goals  OT Goals(current goals can now be found in the care plan section)  Progress towards OT goals: Progressing toward goals  Acute Rehab OT Goals Patient Stated Goal: improve shoulder pain and functional use of RUE OT Goal Formulation: With patient Time For Goal Achievement: 04/17/21 Potential to Achieve Goals: Good  Plan Discharge plan remains appropriate    Co-evaluation                 AM-PAC OT "6 Clicks" Daily Activity     Outcome Measure   Help from another person eating meals?: A Little Help from another person taking care of personal grooming?: A Little Help from another person toileting, which includes using toliet, bedpan, or urinal?: A Little Help from another person bathing (including washing, rinsing, drying)?: A Lot Help from another person to put on and taking off regular upper body clothing?: A Little Help from another person to put on and taking off regular lower body clothing?: A Lot 6 Click Score: 16    End of Session Equipment Utilized During Treatment: Rolling walker (2 wheels)  OT Visit Diagnosis: Pain;History of falling (Z91.81)   Activity Tolerance Patient limited by pain   Patient Left in chair;with call bell/phone within reach;with chair alarm set;with nursing/sitter in room;with family/visitor present   Nurse Communication Mobility status MD and RN informed of patient's reports of visual changes.        Time:  1435-1500 OT Time Calculation (min): 25 min  Charges: OT General Charges $OT Visit: 1 Visit OT Treatments $Self Care/Home Management : 8-22 mins $Therapeutic Exercise: 8-22 mins  Merit Gadsby, OTR/L [place] [phone] Pager: [phone] 04/05/2021, 3:12 PM

## 2021-04-05 NOTE — Progress Notes (Addendum)
Subjective: 4 Days Post-Op Procedure(s) (LRB): TOTAL HIP ARTHROPLASTY ANTERIOR APPROACH (Right) Patient reports pain as improved and stable overall.   Objective: Vital signs in last 24 hours: Temp:  [98 F (36.7 C)-98.6 F (37 C)] 98 F (36.7 C) (11/26 0546) Pulse Rate:  [81-93] 81 (11/26 0546) Resp:  [16-18] 16 (11/26 0546) BP: (136-149)/(45-66) 149/45 (11/26 0546) SpO2:  [91 %-95 %] 93 % (11/26 0731)  Intake/Output from previous day: 11/25 0701 - 11/26 0700 In: 712.7 [P.O.:480; I.V.:232.7] Out: -  Intake/Output this shift: No intake/output data recorded.  Recent Labs    04/04/21 0323 04/06/21 0312  HGB 11.2* 9.6*   Recent Labs    04/04/21 0323 04/06/21 0312  WBC 10.1 5.6  RBC 3.80* 3.19*  HCT 34.6* 29.8*  PLT 272 250   Recent Labs    04/04/21 0323 04/06/21 0312  NA 131* 133*  K 4.5 3.9  CL 94* 98  CO2 26 28  BUN 32* 30*  CREATININE 1.50* 1.27*  GLUCOSE 146* 127*  CALCIUM 8.2* 8.3*   No results for input(s): LABPT, INR in the last 72 hours.  Dorsiflexion/Plantar flexion intact No cellulitis present Compartment soft   Assessment/Plan: 4 Days Post-Op Procedure(s) (LRB): TOTAL HIP ARTHROPLASTY ANTERIOR APPROACH (Right) Up with therapy Ice to shoulder Dispo based on progress with therapy   Armond Hang 04/06/2021, 8:56 AM

## 2021-04-05 NOTE — TOC Transition Note (Signed)
Transition of Care [facility]) - CM/SW Discharge Note   Patient Details  Name: Dominique Ressel MRN: [id number] Date of Birth: [birth year]  [facility] CM/SW Contact:  Lennart Pall, LCSW Phone Number: 04/05/2021, 2:31 PM   Clinical Narrative:    Met with pt and spouse this morning to discuss dc needs. Pt confirms she has all needed DME at home.  She is agreeable to recommendations for HHPT/OT and has no agency preference.  Referral placed with [facility]. Anticipate dc possibly over the weekend.  TOC available if any additional needs arise.   Final next level of care: [place] Barriers to Discharge: Continued Medical Work up   Patient Goals and CMS Choice        Discharge Placement                       Discharge Plan and Services                DME Arranged: N/A DME Agency: NA       HH Arranged: PT, OT HH Agency: [place] Date [facility] Agency Contacted: 04/05/21 Time [place]: 1916 Representative spoke with at [place]: [place] ([place]) Interventions     Readmission Risk Interventions No flowsheet data found.

## 2021-04-06 ENCOUNTER — Inpatient Hospital Stay (HOSPITAL_COMMUNITY): Payer: Medicare Other

## 2021-04-06 ENCOUNTER — Encounter (HOSPITAL_COMMUNITY): Payer: Self-pay | Admitting: Internal Medicine

## 2021-04-06 DIAGNOSIS — I5032 Chronic diastolic (congestive) heart failure: Secondary | ICD-10-CM | POA: Diagnosis not present

## 2021-04-06 DIAGNOSIS — I2510 Atherosclerotic heart disease of native coronary artery without angina pectoris: Secondary | ICD-10-CM | POA: Diagnosis not present

## 2021-04-06 DIAGNOSIS — N183 Chronic kidney disease, stage 3 unspecified: Secondary | ICD-10-CM | POA: Diagnosis not present

## 2021-04-06 DIAGNOSIS — S72044A Nondisplaced fracture of base of neck of right femur, initial encounter for closed fracture: Secondary | ICD-10-CM | POA: Diagnosis not present

## 2021-04-06 LAB — CBC
HCT: 29.8 % — ABNORMAL LOW (ref 36.0–46.0)
Hemoglobin: 9.6 g/dL — ABNORMAL LOW (ref 12.0–15.0)
MCH: 30.1 pg (ref 26.0–34.0)
MCHC: 32.2 g/dL (ref 30.0–36.0)
MCV: 93.4 fL (ref 80.0–100.0)
Platelets: 250 10*3/uL (ref 150–400)
RBC: 3.19 MIL/uL — ABNORMAL LOW (ref 3.87–5.11)
RDW: 12.8 % (ref 11.5–15.5)
WBC: 5.6 10*3/uL (ref 4.0–10.5)
nRBC: 0 % (ref 0.0–0.2)

## 2021-04-06 LAB — BASIC METABOLIC PANEL
Anion gap: 7 (ref 5–15)
BUN: 30 mg/dL — ABNORMAL HIGH (ref 8–23)
CO2: 28 mmol/L (ref 22–32)
Calcium: 8.3 mg/dL — ABNORMAL LOW (ref 8.9–10.3)
Chloride: 98 mmol/L (ref 98–111)
Creatinine, Ser: 1.27 mg/dL — ABNORMAL HIGH (ref 0.44–1.00)
GFR, Estimated: 41 mL/min — ABNORMAL LOW (ref >60)
Glucose, Bld: 127 mg/dL — ABNORMAL HIGH (ref 70–99)
Potassium: 3.9 mmol/L (ref 3.5–5.1)
Sodium: 133 mmol/L — ABNORMAL LOW (ref 135–145)

## 2021-04-06 MED ORDER — GADOBUTROL 1 MMOL/ML IV SOLN
6.0000 mL | Freq: Once | INTRAVENOUS | Status: AC | PRN
  Administered 2021-04-06: 6 mL via INTRAVENOUS

## 2021-04-06 MED ORDER — BISACODYL 10 MG RE SUPP
10.0000 mg | Freq: Once | RECTAL | Status: AC
  Administered 2021-04-06: 10 mg via RECTAL
  Filled 2021-04-06: qty 1

## 2021-04-06 MED ORDER — SENNOSIDES-DOCUSATE SODIUM 8.6-50 MG PO TABS
2.0000 | ORAL_TABLET | Freq: Two times a day (BID) | ORAL | Status: DC
  Administered 2021-04-06 – 2021-04-08 (×5): 2 via ORAL
  Filled 2021-04-06 (×5): qty 2

## 2021-04-06 MED ORDER — SODIUM CHLORIDE 0.9 % IV SOLN
INTRAVENOUS | Status: AC
Start: 2021-04-06 — End: 2021-04-07

## 2021-04-06 NOTE — Progress Notes (Signed)
OT Cancellation Note  Patient Details Name: [name] MRN: [id number] DOB: 11-14-1932   Cancelled Treatment:    Reason Eval/Treat Not Completed: Other (comment) (Just finished with PT. Continues to reports to PT that she is having "blurry vision" (letters bblending intogether) and nausea. Pt wanting to rest. Will return as schedule allows .)  [place], OTR/L Acute Rehab Pager: [phone] Office: [phone] 04/06/2021, 4:20 PM

## 2021-04-06 NOTE — Progress Notes (Signed)
PT Cancellation Note  Patient Details Name: [name] MRN: [id number] DOB: 1932-06-14   Cancelled Treatment:    Reason Eval/Treat Not Completed: Patient at procedure or test/unavailable. Pt off unit for MRI   [facility] 04/06/2021, 11:12 AM

## 2021-04-06 NOTE — Plan of Care (Signed)
  Problem: Clinical Measurements: Goal: Ability to maintain clinical measurements within normal limits will improve Outcome: Progressing   Problem: Activity: Goal: Risk for activity intolerance will decrease Outcome: Progressing   Problem: Pain Managment: Goal: General experience of comfort will improve Outcome: Progressing   

## 2021-04-06 NOTE — Progress Notes (Signed)
   04/06/21 1645  PT Visit Information  Last PT Received On 04/06/21  Assistance Needed Pt tolerated HEP well, demonstrates good understanding of HEP, improving ROM R hip through incr range without significant pain.   History of Present Illness [name] is a 87 y.o. female with a history of DVT on chronic anticoagulation, hyperlipidemia, hypertension, history of breast cancer, stage II chronic kidney disease, asthma, rheumatoid arthritis. Patient [place] and sustained Right hip displaced femoral neck fracture. She is now s/p R THA via anterior approach. CT of head unremarkable. MRI done 11/26 d/t pt with c/o nausea and blurry vision, results pending  Precautions  Precautions Fall  Precaution Comments pain in shoulder/neck, hx of R shoulder ORIF  Restrictions  Weight Bearing Restrictions No  Pain Assessment  Faces Pain Scale 4  Pain Location right shoulder, back, R hip  Pain Descriptors / Indicators Sore;Grimacing  Pain Intervention(s) Limited activity within patient's tolerance;Monitored during session;Ice applied  Cognition  Arousal/Alertness Awake/alert  Behavior During Therapy WFL for tasks assessed/performed  Overall Cognitive Status Within Functional Limits for tasks assessed  Total Joint Exercises  Ankle Circles/Pumps AROM;Both;20 reps;Supine  Quad Sets AROM;Right;10 reps;Supine  Heel Slides AROM;AAROM;Right;10 reps  Hip ABduction/ADduction AAROM;Right;15 reps;Supine  Short Arc Quad AROM;Right;10 reps  Gluteal Sets AROM;Both;10 reps  PT - End of Session  Equipment Utilized During Treatment Gait belt  Activity Tolerance Patient tolerated treatment well  Patient left with call bell/phone within reach;in bed;with bed alarm set   PT - Assessment/Plan  PT Plan Current plan remains appropriate  PT Visit Diagnosis Other abnormalities of gait and mobility (R26.89)  PT Frequency (ACUTE ONLY) Min 5X/week  Follow Up Recommendations Home health PT  Assistance recommended at  discharge Frequent or constant Supervision/Assistance  PT equipment None recommended by PT  AM-PAC PT "6 Clicks" Mobility Outcome Measure (Version 2)  Help needed turning from your back to your side while in a flat bed without using bedrails? 3  Help needed moving from lying on your back to sitting on the side of a flat bed without using bedrails? 3  Help needed moving to and from a bed to a chair (including a wheelchair)? 3  Help needed standing up from a chair using your arms (e.g., wheelchair or bedside chair)? 3  Help needed to walk in hospital room? 3  Help needed climbing 3-5 steps with a railing?  2  6 Click Score 17  Consider Recommendation of Discharge To: [facility] with [facility]  PT Goal Progression  Progress towards PT goals Progressing toward goals  Acute Rehab PT Goals  PT Goal Formulation With patient  Time For Goal Achievement 04/10/21  Potential to Achieve Goals Good  PT Time Calculation  PT Start Time (ACUTE ONLY) 1635  PT Stop Time (ACUTE ONLY) 1645  PT Time Calculation (min) (ACUTE ONLY) 10 min  PT General Charges  $$ ACUTE PT VISIT 1 Visit  PT Treatments  $Therapeutic Exercise 8-22 mins

## 2021-04-06 NOTE — Progress Notes (Signed)
Physical Therapy Treatment Patient Details Name: [name] MRN: [id number] DOB: [birth year] Today's Date: 04/06/2021   History of Present Illness [name] is a 87 y.o. female with a history of DVT on chronic anticoagulation, hyperlipidemia, hypertension, history of breast cancer, stage II chronic kidney disease, asthma, rheumatoid arthritis. Patient fell at church and sustained Right hip displaced femoral neck fracture. She is now s/p R THA via anterior approach. CT of head unremarkable. MRI done 11/26 d/t pt with c/o nausea and blurry vision, results pending    PT Comments    Pt progressing toward goals.  Pt feeling better per her report however still having some mild nausea with mobility and reports continued visual changes. Will allow rest break and return to review HEP.    Recommendations for follow up therapy are one component of a multi-disciplinary discharge planning process, led by the attending physician.  Recommendations may be updated based on patient status, additional functional criteria and insurance authorization.  Follow Up Recommendations  Home health PT     Assistance Recommended at Discharge Frequent or constant Supervision/Assistance  Equipment Recommendations  None recommended by PT    Recommendations for Other Services       Precautions / Restrictions Precautions Precautions: Fall Precaution Comments: pain in shoulder/neck, hx of R shoulder ORIF Restrictions Weight Bearing Restrictions: No     Mobility  Bed Mobility Overal bed mobility: Needs Assistance Bed Mobility: Supine to Sit;Sit to Supine     Supine to sit: Min assist;HOB elevated Sit to supine: Min assist   General bed mobility comments: incr time, assist with RLE off and on to bed d/t pain    Transfers Overall transfer level: Needs assistance Equipment used: Rolling walker (2 wheels) Transfers: Sit to/from Stand Sit to Stand: Min guard           General transfer comment:  verbal cues for correct hand placement, pt attempts to pull up on RW; uses correct hand placement standing from [facility]    Ambulation/Gait Ambulation/Gait assistance: Min guard Gait Distance (Feet): 165 Feet (8' more) Assistive device: Rolling walker (2 wheels) Gait Pattern/deviations: Step-through pattern;Decreased stride length       General Gait Details: cues for posture, improving wt shift to RLE, incr stride length   Stairs             Wheelchair Mobility    Modified Rankin (Stroke Patients Only)       Balance           Standing balance support: No upper extremity supported Standing balance-Leahy Scale: Fair Standing balance comment: able to don mask in standing                            Cognition Arousal/Alertness: Awake/alert Behavior During Therapy: WFL for tasks assessed/performed Overall Cognitive Status: Within Functional Limits for tasks assessed                                          Exercises      General Comments        Pertinent Vitals/Pain Pain Assessment: Faces Faces Pain Scale: Hurts little more Pain Location: right shoulder, back, R hip Pain Descriptors / Indicators: Sore;Grimacing Pain Intervention(s): Limited activity within patient's tolerance;Monitored during session;Premedicated before session;Repositioned    Home Living  Prior Function            PT Goals (current goals can now be found in the care plan section) Acute Rehab PT Goals PT Goal Formulation: With patient Time For Goal Achievement: 04/10/21 Potential to Achieve Goals: Good Progress towards PT goals: Progressing toward goals    Frequency    Min 5X/week      PT Plan Current plan remains appropriate    Co-evaluation              AM-PAC PT "6 Clicks" Mobility   Outcome Measure  Help needed turning from your back to your side while in a flat bed without using bedrails?: A  Little Help needed moving from lying on your back to sitting on the side of a flat bed without using bedrails?: A Little Help needed moving to and from a bed to a chair (including a wheelchair)?: A Little Help needed standing up from a chair using your arms (e.g., wheelchair or bedside chair)?: A Little Help needed to walk in hospital room?: A Little Help needed climbing 3-5 steps with a railing? : A Lot 6 Click Score: 17    End of Session Equipment Utilized During Treatment: Gait belt Activity Tolerance: Patient tolerated treatment well Patient left: in bed;with call bell/phone within reach;with bed alarm set   PT Visit Diagnosis: Other abnormalities of gait and mobility (R26.89)     Time: 9373-4287 PT Time Calculation (min) (ACUTE ONLY): 19 min  Charges:  $Gait Training: 8-22 mins                     Baxter Flattery, PT  Acute Rehab Dept ([place]) [phone] Pager [phone]  04/06/2021    [facility] 04/06/2021, 4:30 PM

## 2021-04-06 NOTE — Progress Notes (Signed)
PROGRESS NOTE    [name]  MGN:[id number] DOB: Nov 22, 1932 DOA: 03/31/2021 PCP: Crist Infante, MD    Brief Narrative:  [name] is a 87 year old female with past medical history significant for DVT on Xarelto, hyperlipidemia, essential hypertension, breast cancer, CKD stage IIIb, asthma, rheumatoid arthritis who presents to [facility] ED on 11/20 following mechanical fall while putting up decorations at church.  Patient developed severe right-sided pain in her right hip and unable to bear weight.  Imaging in the ED notable for right femoral neck fracture.  Orthopedics was consulted.  Hospitalist service consulted for further evaluation and management.   Assessment & Plan:   Principal Problem:   Femur fracture, right (HCC) Active Problems:   GERD   History of DVT of lower extremity   CKD (chronic kidney disease) stage 3, GFR 30-59 ml/min (HCC)   CAD (coronary artery disease), native coronary artery   Chronic diastolic CHF (congestive heart failure) ([place])   Essential hypertension   Right femoral neck fracture s/p total hip arthroplasty Patient presenting to ED following mechanical fall with acute right hip pain with inability ambulate.  Right hip x-ray with subcapital right femoral neck fracture with mild impaction and valgus angulation.  Orthopedics was consulted and patient underwent total hip arthroplasty on 11/23 by Dr. Alvan Dame. --Continue postoperative DVT prophylaxis with Xarelto --Norco 5-325 mg 1-2 tablets q4h PRN moderate/severe pain --Morphine 0.5-1.0 mg  --Robaxin 500 mg PO q6h PRN muscle spasms --PT/OT recommending home health, continue therapy efforts while inpatient --Outpatient follow-up with orthopedics  Right shoulder pain secondary to contusion from fall Continues to complain of right shoulder pain.  X-rays negative for fracture/bony injury. --Continue pain control, ice to affected area --PT/OT  Visual disturbance Patient reports visual disturbance, while  reading the lines seem to "blur".  Patient did strike head due to events from fall leading to hospitalization.  CT head on admission with soft tissue contusion right parietal scalp with no acute intracranial pathology.  Given her continued disturbance with vision and nausea/vomiting, MR brain was performed on 04/06/2021 with no evidence of acute intracranial abnormality. --Continue symptom control with antiemetics, IV fluid hydration as needed --Continue therapy efforts  Essential hypertension --Irbesartan 75 mg p.o. daily (telmesartan outpatient)  Chronic diastolic congestive heart failure, compensated --Strict I's and O's and daily weights  CKD stage IIIb --Creatinine 1.27, stable.  Asthma --Breo Ellipta 1 puff daily (on symbicort outpatient) --Singulair 10 mg p.o. nightly --Albuterol every 6 hours as needed wheezing/shortness of breath  Hyperlipidemia: Crestor 5 mg p.o. daily  GERD: Continue PPI  Iron deficiency anemia:  --Ferrous sulfate 325 mg p.o. 3 times daily   DVT prophylaxis: rivaroxaban (XARELTO) tablet 10 mg Start: 04/03/21 1000 SCDs Start: 04/02/21 1748 Place TED hose Start: 04/02/21 1748 SCDs Start: 03/31/21 2320 rivaroxaban (XARELTO) tablet 10 mg    Code Status: Full Code Family Communication: No family present at bedside this morning  Disposition Plan:  Level of care: Med-Surg Status is: Inpatient  Remains inpatient appropriate because: Continue PT/OT efforts, anticipate likely discharge home with home health when able to ambulate more and pain better controlled    Consultants:  Orthopedics  Procedures:  Total hip arthroplasty, Dr. Alvan Dame 11/22  Antimicrobials:  Perioperative cefazolin   Subjective: Patient seen examined bedside, resting comfortably.  Sitting on bedside commode.  No family present at bedside this morning.  Patient reports visual disturbance yesterday with blurring of the lines while reading.  Also with nausea and feels like she needs  to vomit.  States "it would feel better if this would just come out".  Discussed with her obtaining MRI of the brain given her presenting symptoms fall in which she did strike her head with initial head CT unrevealing other than contusion.  No other questions or concerns at this time.  Denies headache, no dizziness, no chest pain, no palpitations, no abdominal pain, no fever/chills/night sweats, no nausea/vomiting/diarrhea.  No acute events overnight per nursing staff.  Objective: Vitals:   04/05/21 2225 04/06/21 0546 04/06/21 0731 04/06/21 1211  BP: (!) 142/50 (!) 149/45  (!) 166/58  Pulse: 90 81  76  Resp: 18 16  17   Temp: 98.6 F (37 C) 98 F (36.7 C)  97.9 F (36.6 C)  TempSrc: Oral Oral  Oral  SpO2: 92% 91% 93% 92%  Weight:      Height:        Intake/Output Summary (Last 24 hours) at 04/06/2021 1308 Last data filed at 04/06/2021 1000 Gross per 24 hour  Intake 712.72 ml  Output 0 ml  Net 712.72 ml   Filed Weights   03/31/21 1424  Weight: 63.5 kg    Examination:  General exam: Appears calm and comfortable  Respiratory system: Clear to auscultation. Respiratory effort normal.  On room air Cardiovascular system: S1 & S2 heard, RRR. No JVD, murmurs, rubs, gallops or clicks. No pedal edema. Gastrointestinal system: Abdomen is nondistended, soft and nontender. No organomegaly or masses felt. Normal bowel sounds heard. Central nervous system: Alert and oriented. No focal neurological deficits. Extremities: Symmetric 5 x 5 power.  Surgical dressing noted, clean/dry/intact, right shoulder with ecchymosis Skin: No rashes, lesions or ulcers Psychiatry: Judgement and insight appear normal. Mood & affect appropriate.     Data Reviewed: I have personally reviewed following labs and imaging studies  CBC: Recent Labs  Lab 03/31/21 1443 04/01/21 0209 04/03/21 0312 04/04/21 0323 04/06/21 0312  WBC 9.2 5.4 6.7 10.1 5.6  NEUTROABS 7.2  --   --   --   --   HGB 12.0 10.4* 10.0*  11.2* 9.6*  HCT 37.8 32.6* 31.2* 34.6* 29.8*  MCV 92.9 92.6 91.8 91.1 93.4  PLT 248 179 157 272 387   Basic Metabolic Panel: Recent Labs  Lab 03/31/21 1443 04/01/21 0209 04/03/21 0312 04/04/21 0323 04/06/21 0312  NA 136 135 127* 131* 133*  K 3.8 4.4 4.2 4.5 3.9  CL 98 98 93* 94* 98  CO2 28 29 25 26 28   GLUCOSE 164* 137* 164* 146* 127*  BUN 20 19 22  32* 30*  CREATININE 1.49* 1.33* 1.22* 1.50* 1.27*  CALCIUM 8.8* 8.6* 7.6* 8.2* 8.3*   GFR: Estimated Creatinine Clearance: 28.6 mL/min (A) (by C-G formula based on SCr of 1.27 mg/dL (H)). Liver Function Tests: No results for input(s): AST, ALT, ALKPHOS, BILITOT, PROT, ALBUMIN in the last 168 hours. No results for input(s): LIPASE, AMYLASE in the last 168 hours. No results for input(s): AMMONIA in the last 168 hours. Coagulation Profile: Recent Labs  Lab 04/01/21 0209  INR 1.1   Cardiac Enzymes: No results for input(s): CKTOTAL, CKMB, CKMBINDEX, TROPONINI in the last 168 hours. BNP (last 3 results) No results for input(s): PROBNP in the last 8760 hours. HbA1C: No results for input(s): HGBA1C in the last 72 hours. CBG: No results for input(s): GLUCAP in the last 168 hours. Lipid Profile: No results for input(s): CHOL, HDL, LDLCALC, TRIG, CHOLHDL, LDLDIRECT in the last 72 hours. Thyroid Function Tests: No results for input(s): TSH, T4TOTAL, FREET4, T3FREE, THYROIDAB  in the last 72 hours. Anemia Panel: No results for input(s): VITAMINB12, FOLATE, FERRITIN, TIBC, IRON, RETICCTPCT in the last 72 hours. Sepsis Labs: No results for input(s): PROCALCITON, LATICACIDVEN in the last 168 hours.  Recent Results (from the past 240 hour(s))  Resp Panel by RT-PCR (Flu A&B, Covid) Nasopharyngeal Swab     Status: None   Collection Time: 03/31/21  4:44 PM   Specimen: Nasopharyngeal Swab; Nasopharyngeal(NP) swabs in vial transport medium  Result Value Ref Range Status   SARS Coronavirus 2 by RT PCR NEGATIVE NEGATIVE Final    Comment:  (NOTE) SARS-CoV-2 target nucleic acids are NOT DETECTED.  The SARS-CoV-2 RNA is generally detectable in upper respiratory specimens during the acute phase of infection. The lowest concentration of SARS-CoV-2 viral copies this assay can detect is 138 copies/mL. A negative result does not preclude SARS-Cov-2 infection and should not be used as the sole basis for treatment or other patient management decisions. A negative result may occur with  improper specimen collection/handling, submission of specimen other than nasopharyngeal swab, presence of viral mutation(s) within the areas targeted by this assay, and inadequate number of viral copies(<138 copies/mL). A negative result must be combined with clinical observations, patient history, and epidemiological information. The expected result is Negative.  Fact Sheet for Patients:  EntrepreneurPulse.com.au  Fact Sheet for Healthcare Providers:  IncredibleEmployment.be  This test is no t yet approved or cleared by the [place] FDA and  has been authorized for detection and/or diagnosis of SARS-CoV-2 by FDA under an Emergency Use Authorization (EUA). This EUA will remain  in effect (meaning this test can be used) for the duration of the COVID-19 declaration under Section 564(b)(1) of the Act, 21 U.S.C.section 360bbb-3(b)(1), unless the authorization is terminated  or revoked sooner.       Influenza A by PCR NEGATIVE NEGATIVE Final   Influenza B by PCR NEGATIVE NEGATIVE Final    Comment: (NOTE) The Xpert Xpress SARS-CoV-2/FLU/RSV plus assay is intended as an aid in the diagnosis of influenza from Nasopharyngeal swab specimens and should not be used as a sole basis for treatment. Nasal washings and aspirates are unacceptable for Xpert Xpress SARS-CoV-2/FLU/RSV testing.  Fact Sheet for Patients: EntrepreneurPulse.com.au  Fact Sheet for Healthcare  Providers: IncredibleEmployment.be  This test is not yet approved or cleared by the [place] FDA and has been authorized for detection and/or diagnosis of SARS-CoV-2 by FDA under an Emergency Use Authorization (EUA). This EUA will remain in effect (meaning this test can be used) for the duration of the COVID-19 declaration under Section 564(b)(1) of the Act, 21 U.S.C. section 360bbb-3(b)(1), unless the authorization is terminated or revoked.  Performed at [facility], [address], [place], [place] [address]   Surgical pcr screen     Status: None   Collection Time: 04/01/21  8:41 PM   Specimen: Nasal Mucosa; Nasal Swab  Result Value Ref Range Status   MRSA, PCR NEGATIVE NEGATIVE Final   Staphylococcus aureus NEGATIVE NEGATIVE Final    Comment: (NOTE) The Xpert SA Assay (FDA approved for NASAL specimens in patients 30 years of age and older), is one component of a comprehensive surveillance program. It is not intended to diagnose infection nor to guide or monitor treatment. Performed at [facility], [place] [address], [place], [place] [address]          Radiology Studies: MR BRAIN W WO CONTRAST  Result Date: 04/06/2021 CLINICAL DATA:  Dizziness, nonspecific. EXAM: MRI HEAD WITHOUT AND WITH  CONTRAST TECHNIQUE: Multiplanar, multiecho pulse sequences of the brain and surrounding structures were obtained without and with intravenous contrast. CONTRAST:  7mL GADAVIST GADOBUTROL 1 MMOL/ML IV SOLN COMPARISON:  Head CT 03/31/2021. FINDINGS: Brain: Mild intermittent motion degradation. Mild generalized cerebral and cerebellar atrophy. No cortical encephalomalacia is identified. No significant cerebral white matter disease for age. There is no acute infarct. No evidence of an intracranial mass. No chronic intracranial blood products. No extra-axial fluid collection. No midline shift. No pathologic intracranial enhancement identified. Vascular:  Maintained flow voids within the proximal large arterial vessels. Skull and upper cervical spine: No focal suspicious marrow lesion. Incompletely assessed cervical spondylosis Sinuses/Orbits: Visualized orbits show no acute finding. Bilateral lens replacements. Minimal mucosal thickening within the left ethmoid air cells. IMPRESSION: Mildly motion degraded exam. Unremarkable MRI appearance of the brain for age. No evidence of acute intracranial abnormality. Minimal left ethmoid sinus mucosal thickening. Electronically Signed   By: Kellie Simmering D.O.   On: 04/06/2021 11:28        Scheduled Meds:  docusate sodium  100 mg Oral BID   ferrous sulfate  325 mg Oral TID PC   fluticasone  1 spray Each Nare Daily   fluticasone furoate-vilanterol  1 puff Inhalation Daily   irbesartan  75 mg Oral Daily   loratadine  10 mg Oral Daily   montelukast  10 mg Oral QHS   pantoprazole  40 mg Oral Daily   pramipexole  0.375 mg Oral QHS   rivaroxaban  10 mg Oral Daily   rosuvastatin  5 mg Oral QHS   senna-docusate  2 tablet Oral BID   Continuous Infusions:  sodium chloride 75 mL/hr at 04/02/21 1814   methocarbamol (ROBAXIN) IV 500 mg (04/02/21 1557)     LOS: 6 days    Time spent: 39 minutes spent on chart review, discussion with nursing staff, consultants, updating family and interview/physical exam; more than 50% of that time was spent in counseling and/or coordination of care.    Mayanna Garlitz J [place] DO [facility] Available via Epic secure chat 7am-7pm After these hours, please refer to coverage provider listed on [email or web address] 04/06/2021, 1:08 PM

## 2021-04-07 DIAGNOSIS — S72044A Nondisplaced fracture of base of neck of right femur, initial encounter for closed fracture: Secondary | ICD-10-CM | POA: Diagnosis not present

## 2021-04-07 DIAGNOSIS — I5032 Chronic diastolic (congestive) heart failure: Secondary | ICD-10-CM | POA: Diagnosis not present

## 2021-04-07 DIAGNOSIS — N183 Chronic kidney disease, stage 3 unspecified: Secondary | ICD-10-CM | POA: Diagnosis not present

## 2021-04-07 DIAGNOSIS — I2510 Atherosclerotic heart disease of native coronary artery without angina pectoris: Secondary | ICD-10-CM | POA: Diagnosis not present

## 2021-04-07 LAB — RESP PANEL BY RT-PCR (FLU A&B, COVID) ARPGX2
Influenza A by PCR: NEGATIVE
Influenza B by PCR: NEGATIVE
SARS Coronavirus 2 by RT PCR: NEGATIVE

## 2021-04-07 NOTE — Progress Notes (Signed)
Occupational Therapy Treatment Patient Details Name: [name] MRN: [id number] DOB: 03-06-1933 Today's Date: 04/07/2021   History of present illness [name] is a 87 y.o. female with a history of DVT on chronic anticoagulation, hyperlipidemia, hypertension, history of breast cancer, stage II chronic kidney disease, asthma, rheumatoid arthritis. Patient fell at church and sustained Right hip displaced femoral neck fracture. She is now s/p R THA via anterior approach. CT of head unremarkable. MRI done 11/26 d/t pt with c/o nausea and blurry vision, results pending   OT comments  Patient was able to show progress with LE dressing at EOB with use of adaptive equipment which pt already has at home. Pt doffed and donned socks with supervision and use of reacher and sock aid with verbal cues as needed for technique. Pt with poor tolerance to sitting EOB, feeling generally unwell, anxious about her husband and reported nausea and feeling "hot". Pt's temp in room was turned down but without difference in pt's tolerance. Ultimately pt refused to stand or engage in OOB activities and asked to lie back down after short EOB session. Pt's BP at EOB was 187/73, HR: 86. Dr. [place] in room as pt returning to bed and notified of pt's intolerance of attempting to stands or t/f training. Agreed that SNF may become necessary if pt does not improve tomorrow.    Recommendations for follow up therapy are one component of a multi-disciplinary discharge planning process, led by the attending physician.  Recommendations may be updated based on patient status, additional functional criteria and insurance authorization.    Follow Up Recommendations  Home health OT    Assistance Recommended at Discharge Frequent or constant Supervision/Assistance  Equipment Recommendations  None recommended by OT    Recommendations for Other Services      Precautions / Restrictions Precautions Precautions: Fall Precaution  Comments: pain in shoulder/neck, hx of R shoulder ORIF Restrictions Weight Bearing Restrictions: No       Mobility Bed Mobility Overal bed mobility: Needs Assistance Bed Mobility: Supine to Sit;Sit to Supine     Supine to sit: Min assist;HOB elevated Sit to supine: Min assist   General bed mobility comments: incr time, assist with RLE off and on to bed d/t pain    Transfers                   General transfer comment: Pt declined t/f training this session due to nausea/hot     Balance Overall balance assessment: History of Falls;Needs assistance Sitting-balance support: No upper extremity supported;Feet supported Sitting balance-Leahy Scale: Good                                     ADL either performed or assessed with clinical judgement   ADL Overall ADL's : Needs assistance/impaired Eating/Feeding: Set up;Sitting;Bed level Eating/Feeding Details (indicate cue type and reason): To take pills and water.                 Lower Body Dressing: Supervision/safety;Set up;Cueing for sequencing;Cueing for compensatory techniques;With adaptive equipment Lower Body Dressing Details (indicate cue type and reason): Pt has all "hip kit" AE at home. Pt able to use reacher to doff socks. educated on sequence and use of reacher to don underwear and pants for ease. Pt reports that she recalls doing that after her previous hip sx. Pt used sock aid with cues for sequence. Pt declined to stand  to practice standing dressing due to nausea and feeling hot.   Toilet Transfer Details (indicate cue type and reason): Pt declined.                Extremity/Trunk Assessment Upper Extremity Assessment RUE Deficits / Details: Guarding RT shoulder but denied pain today. RT shoudler bruised.       Cervical / Trunk Assessment Cervical / Trunk Assessment: Normal    Vision   Vision Assessment?: No apparent visual deficits Additional Comments: Pt reports that she does  not like her "new" glasses at the hospital and prefers her old ones at home. Pt able to accurately read clock on wall without glasses.   Perception     Praxis      Cognition Arousal/Alertness: Awake/alert Behavior During Therapy: Anxious Overall Cognitive Status: Within Functional Limits for tasks assessed                                 General Comments: Seems to feel generally unwell. Expressed feeling anxious about her husband who she believes has COVID.          Exercises     Shoulder Instructions       General Comments      Pertinent Vitals/ Pain       Pain Assessment: No/denies pain Pain Intervention(s): Ice applied (Ice to RT shoulder as pt returned to supine.)  Home Living                                          Prior Functioning/Environment              Frequency  Min 2X/week        Progress Toward Goals  OT Goals(current goals can now be found in the care plan section)  Progress towards OT goals: Progressing toward goals     Plan Discharge plan remains appropriate (May need to change rec to SNF if pt continues slow progress tomorrow.)    Co-evaluation                 AM-PAC OT "6 Clicks" Daily Activity     Outcome Measure   Help from another person eating meals?: A Little Help from another person taking care of personal grooming?: A Little Help from another person toileting, which includes using toliet, bedpan, or urinal?: A Lot Help from another person bathing (including washing, rinsing, drying)?: A Lot Help from another person to put on and taking off regular upper body clothing?: A Little Help from another person to put on and taking off regular lower body clothing?: A Little 6 Click Score: 16    End of Session    OT Visit Diagnosis: Pain;History of falling (Z91.81)   Activity Tolerance Patient limited by fatigue (Limited due to nausea/feeling hot)   Patient Left in bed;with call bell/phone  within reach;with bed alarm set   Nurse Communication Mobility status        Time: 6808-8110 OT Time Calculation (min): 21 min  Charges: OT General Charges $OT Visit: 1 Visit OT Treatments $Self Care/Home Management : 8-22 mins  Anderson [place], [place] Office: [phone] 04/07/2021  Julien Girt 04/07/2021, 12:37 PM

## 2021-04-07 NOTE — Progress Notes (Signed)
Physical Therapy Treatment Patient Details Name: [name] MRN: [id number] DOB: [birth year] Today's Date: 04/07/2021   History of Present Illness Lenyx Alcindor is a 87 y.o. female with a history of DVT on chronic anticoagulation, hyperlipidemia, hypertension, history of breast cancer, stage II chronic kidney disease, asthma, rheumatoid arthritis. Patient fell at church and sustained Right hip displaced femoral neck fracture. She is now s/p R THA via anterior approach. CT of head unremarkable. MRI done 11/26 and was negative for acute findings. Pt Covid test negative today     PT Comments    Pt continues to progress with therapy, she is amb long household distance of ~170' with RW and supervision. We reviewed ascending/descending one step and pt tol well.  Pt reports her husband tested positive for Covid.   Pt is ready  to d/c home from PT standpoint  with HHPT f/u.    Recommendations for follow up therapy are one component of a multi-disciplinary discharge planning process, led by the attending physician.  Recommendations may be updated based on patient status, additional functional criteria and insurance authorization.  Follow Up Recommendations  Home health PT     Assistance Recommended at Discharge Frequent or constant Supervision/Assistance  Equipment Recommendations  None recommended by PT    Recommendations for Other Services       Precautions / Restrictions Precautions Precautions: Fall Precaution Comments: pain in shoulder/neck, hx of R shoulder ORIF Restrictions Weight Bearing Restrictions: No     Mobility  Bed Mobility Overal bed mobility: Needs Assistance Bed Mobility: Supine to Sit;Sit to Supine     Supine to sit: Min assist;HOB elevated Sit to supine: Min assist   General bed mobility comments:  (NT in recliner)    Transfers Overall transfer level: Needs assistance Equipment used: Rolling walker (2 wheels) Transfers: Sit to/from Stand Sit to  Stand: Supervision           General transfer comment: demonstrates good carryover with correct hand placement    Ambulation/Gait Ambulation/Gait assistance: Supervision;Min guard Gait Distance (Feet): 170 Feet Assistive device: Rolling walker (2 wheels) Gait Pattern/deviations: Step-through pattern;Decreased stride length       General Gait Details: cues for heel strike, stride length. improving fluidity of gait   Stairs Stairs: Yes Stairs assistance: Min guard;Min assist Stair Management: No rails;Step to pattern;Forwards;With walker Number of Stairs: 1 General stair comments: cues for sequence and technique, min/guard for safety, assist to lower RW to floor on descent   Wheelchair Mobility    Modified Rankin (Stroke Patients Only)       Balance Overall balance assessment: History of Falls;Needs assistance Sitting-balance support: No upper extremity supported;Feet supported Sitting balance-Leahy Scale: Good       Standing balance-Leahy Scale: Fair Standing balance comment: able to don mask in standing                            Cognition Arousal/Alertness: Awake/alert Behavior During Therapy: WFL for tasks assessed/performed Overall Cognitive Status: Within Functional Limits for tasks assessed                                 General Comments: Seems to feel generally unwell. Expressed feeling anxious about her husband who she believes has COVID.        Exercises Total Joint Exercises Ankle Circles/Pumps: AROM;Both;20 reps;Supine    General Comments  Pertinent Vitals/Pain Pain Assessment: 0-10 Pain Score: 2  Pain Location: right shoulder, R hip Pain Descriptors / Indicators: Sore;Grimacing Pain Intervention(s): Ice applied (Ice to RT shoulder as pt returned to supine.)    Home Living                          Prior Function            PT Goals (current goals can now be found in the care plan  section) Acute Rehab PT Goals PT Goal Formulation: With patient Time For Goal Achievement: 04/10/21 Potential to Achieve Goals: Good Progress towards PT goals: Progressing toward goals    Frequency    Min 5X/week      PT Plan Current plan remains appropriate    Co-evaluation              AM-PAC PT "6 Clicks" Mobility   Outcome Measure  Help needed turning from your back to your side while in a flat bed without using bedrails?: A Little Help needed moving from lying on your back to sitting on the side of a flat bed without using bedrails?: A Little Help needed moving to and from a bed to a chair (including a wheelchair)?: A Little Help needed standing up from a chair using your arms (e.g., wheelchair or bedside chair)?: A Little Help needed to walk in hospital room?: A Little Help needed climbing 3-5 steps with a railing? : A Lot 6 Click Score: 17    End of Session Equipment Utilized During Treatment: Gait belt Activity Tolerance: Patient tolerated treatment well Patient left: with call bell/phone within reach;in bed;with bed alarm set   PT Visit Diagnosis: Other abnormalities of gait and mobility (R26.89)     Time: 2951-8841 PT Time Calculation (min) (ACUTE ONLY): 28 min  Charges:  $Gait Training: 23-37 mins                     .t    [facility] 04/07/2021, 4:10 PM

## 2021-04-07 NOTE — Progress Notes (Signed)
PROGRESS NOTE    Maurice Storck  NFA:[id number] DOB: 10-05-1932 DOA: 03/31/2021 PCP: Crist Infante, MD    Brief Narrative:  [name] is a 87 year old female with past medical history significant for DVT on Xarelto, hyperlipidemia, essential hypertension, breast cancer, CKD stage IIIb, asthma, rheumatoid arthritis who presents to [facility] ED on 11/20 following mechanical fall while putting up decorations at church.  Patient developed severe right-sided pain in her right hip and unable to bear weight.  Imaging in the ED notable for right femoral neck fracture.  Orthopedics was consulted.  Hospitalist service consulted for further evaluation and management.   Assessment & Plan:   Principal Problem:   Femur fracture, right (HCC) Active Problems:   GERD   History of DVT of lower extremity   CKD (chronic kidney disease) stage 3, GFR 30-59 ml/min (HCC)   CAD (coronary artery disease), native coronary artery   Chronic diastolic CHF (congestive heart failure) ([place])   Essential hypertension   Right femoral neck fracture s/p total hip arthroplasty Patient presenting to ED following mechanical fall with acute right hip pain with inability ambulate.  Right hip x-ray with subcapital right femoral neck fracture with mild impaction and valgus angulation.  Orthopedics was consulted and patient underwent total hip arthroplasty on 11/23 by Dr. Alvan Dame. --Continue postoperative DVT prophylaxis with Xarelto --Norco 5-325 mg 1-2 tablets q4h PRN moderate/severe pain --Morphine 0.5-1.0 mg  --Robaxin 500 mg PO q6h PRN muscle spasms --PT/OT recommending home health, continue therapy efforts while inpatient --Outpatient follow-up with orthopedics  Right shoulder pain secondary to contusion from fall Continues to complain of right shoulder pain.  X-rays negative for fracture/bony injury. --Continue pain control, ice to affected area --PT/OT  Visual disturbance Patient reports visual disturbance, while  reading the lines seem to "blur".  Patient did strike head due to events from fall leading to hospitalization.  CT head on admission with soft tissue contusion right parietal scalp with no acute intracranial pathology.  Given her continued disturbance with vision and nausea/vomiting, MR brain was performed on 04/06/2021 with no evidence of acute intracranial abnormality. --Continue symptom control with antiemetics, IV fluid hydration as needed --Continue therapy efforts  Essential hypertension --Irbesartan 75 mg p.o. daily (telmesartan outpatient)  Chronic diastolic congestive heart failure, compensated --Strict I's and O's and daily weights  CKD stage IIIb --Creatinine 1.27, stable.  Asthma --Breo Ellipta 1 puff daily (on symbicort outpatient) --Singulair 10 mg p.o. nightly --Albuterol every 6 hours as needed wheezing/shortness of breath  Hyperlipidemia: Crestor 5 mg p.o. daily  GERD: Continue PPI  Iron deficiency anemia:  --Ferrous sulfate 325 mg p.o. 3 times daily  Generalized ill feeling with congestion: Patient reports her husband is now home is sick.  Apparently he had a COVID exposure on 04/04/21; and he is currently getting tested.  Patient has been feeling ill, dizzy with congestion over the last 2 days. --Obtain COVID-19 PCR today --Placed on airborne/contact isolation until test results   DVT prophylaxis: rivaroxaban (XARELTO) tablet 10 mg Start: 04/03/21 1000 SCDs Start: 04/02/21 1748 Place TED hose Start: 04/02/21 1748 SCDs Start: 03/31/21 2320 rivaroxaban (XARELTO) tablet 10 mg    Code Status: Full Code Family Communication: No family present at bedside this morning  Disposition Plan:  Level of care: Med-Surg Status is: Inpatient  Remains inpatient appropriate because: Continue PT/OT efforts, anticipate likely discharge home with home health when able to ambulate more and pain better controlled    Consultants:  Orthopedics  Procedures:  Total hip  arthroplasty, Dr. Alvan Dame 11/22  Antimicrobials:  Perioperative cefazolin   Subjective: Patient seen examined bedside, resting comfortably.  Lying in bed.  Reports that her husband had a recent COVID exposure and has not been feeling well for the last 2 days, he is currently getting tested.  Patient concerned that she may have had COVID exposure from her husband with generalized ill feeling, congestion now.  No other questions or concerns at this time.  Denies headache, no dizziness, no chest pain, no palpitations, no abdominal pain, no fever/chills/night sweats, no nausea/vomiting/diarrhea.  No acute events overnight per nursing staff.  Objective: Vitals:   04/06/21 1211 04/06/21 2208 04/07/21 0529 04/07/21 0755  BP: (!) 166/58 (!) 152/54 (!) 161/61   Pulse: 76 80 78   Resp: 17 16 16    Temp: 97.9 F (36.6 C) 97.8 F (36.6 C) 98.2 F (36.8 C)   TempSrc: Oral Oral Oral   SpO2: 92% 96% 97% 97%  Weight:      Height:        Intake/Output Summary (Last 24 hours) at 04/07/2021 0939 Last data filed at 04/07/2021 0925 Gross per 24 hour  Intake 1539.19 ml  Output 0 ml  Net 1539.19 ml   Filed Weights   03/31/21 1424  Weight: 63.5 kg    Examination:  General exam: Appears calm and comfortable  Respiratory system: Clear to auscultation. Respiratory effort normal.  On room air Cardiovascular system: S1 & S2 heard, RRR. No JVD, murmurs, rubs, gallops or clicks. No pedal edema. Gastrointestinal system: Abdomen is nondistended, soft and nontender. No organomegaly or masses felt. Normal bowel sounds heard. Central nervous system: Alert and oriented. No focal neurological deficits. Extremities: Symmetric 5 x 5 power.  Surgical dressing noted, clean/dry/intact, right shoulder with ecchymosis Skin: No rashes, lesions or ulcers Psychiatry: Judgement and insight appear normal. Mood & affect appropriate.     Data Reviewed: I have personally reviewed following labs and imaging  studies  CBC: Recent Labs  Lab 03/31/21 1443 04/01/21 0209 04/03/21 0312 04/04/21 0323 04/06/21 0312  WBC 9.2 5.4 6.7 10.1 5.6  NEUTROABS 7.2  --   --   --   --   HGB 12.0 10.4* 10.0* 11.2* 9.6*  HCT 37.8 32.6* 31.2* 34.6* 29.8*  MCV 92.9 92.6 91.8 91.1 93.4  PLT 248 179 157 272 130   Basic Metabolic Panel: Recent Labs  Lab 03/31/21 1443 04/01/21 0209 04/03/21 0312 04/04/21 0323 04/06/21 0312  NA 136 135 127* 131* 133*  K 3.8 4.4 4.2 4.5 3.9  CL 98 98 93* 94* 98  CO2 28 29 25 26 28   GLUCOSE 164* 137* 164* 146* 127*  BUN 20 19 22  32* 30*  CREATININE 1.49* 1.33* 1.22* 1.50* 1.27*  CALCIUM 8.8* 8.6* 7.6* 8.2* 8.3*   GFR: Estimated Creatinine Clearance: 28.6 mL/min (A) (by C-G formula based on SCr of 1.27 mg/dL (H)). Liver Function Tests: No results for input(s): AST, ALT, ALKPHOS, BILITOT, PROT, ALBUMIN in the last 168 hours. No results for input(s): LIPASE, AMYLASE in the last 168 hours. No results for input(s): AMMONIA in the last 168 hours. Coagulation Profile: Recent Labs  Lab 04/01/21 0209  INR 1.1   Cardiac Enzymes: No results for input(s): CKTOTAL, CKMB, CKMBINDEX, TROPONINI in the last 168 hours. BNP (last 3 results) No results for input(s): PROBNP in the last 8760 hours. HbA1C: No results for input(s): HGBA1C in the last 72 hours. CBG: No results for input(s): GLUCAP in the last 168 hours. Lipid Profile:  No results for input(s): CHOL, HDL, LDLCALC, TRIG, CHOLHDL, LDLDIRECT in the last 72 hours. Thyroid Function Tests: No results for input(s): TSH, T4TOTAL, FREET4, T3FREE, THYROIDAB in the last 72 hours. Anemia Panel: No results for input(s): VITAMINB12, FOLATE, FERRITIN, TIBC, IRON, RETICCTPCT in the last 72 hours. Sepsis Labs: No results for input(s): PROCALCITON, LATICACIDVEN in the last 168 hours.  Recent Results (from the past 240 hour(s))  Resp Panel by RT-PCR (Flu A&B, Covid) Nasopharyngeal Swab     Status: None   Collection Time: 03/31/21   4:44 PM   Specimen: Nasopharyngeal Swab; Nasopharyngeal(NP) swabs in vial transport medium  Result Value Ref Range Status   SARS Coronavirus 2 by RT PCR NEGATIVE NEGATIVE Final    Comment: (NOTE) SARS-CoV-2 target nucleic acids are NOT DETECTED.  The SARS-CoV-2 RNA is generally detectable in upper respiratory specimens during the acute phase of infection. The lowest concentration of SARS-CoV-2 viral copies this assay can detect is 138 copies/mL. A negative result does not preclude SARS-Cov-2 infection and should not be used as the sole basis for treatment or other patient management decisions. A negative result may occur with  improper specimen collection/handling, submission of specimen other than nasopharyngeal swab, presence of viral mutation(s) within the areas targeted by this assay, and inadequate number of viral copies(<138 copies/mL). A negative result must be combined with clinical observations, patient history, and epidemiological information. The expected result is Negative.  Fact Sheet for Patients:  EntrepreneurPulse.com.au  Fact Sheet for Healthcare Providers:  IncredibleEmployment.be  This test is no t yet approved or cleared by the [place] FDA and  has been authorized for detection and/or diagnosis of SARS-CoV-2 by FDA under an Emergency Use Authorization (EUA). This EUA will remain  in effect (meaning this test can be used) for the duration of the COVID-19 declaration under Section 564(b)(1) of the Act, 21 U.S.C.section 360bbb-3(b)(1), unless the authorization is terminated  or revoked sooner.       Influenza A by PCR NEGATIVE NEGATIVE Final   Influenza B by PCR NEGATIVE NEGATIVE Final    Comment: (NOTE) The Xpert Xpress SARS-CoV-2/FLU/RSV plus assay is intended as an aid in the diagnosis of influenza from Nasopharyngeal swab specimens and should not be used as a sole basis for treatment. Nasal washings and aspirates  are unacceptable for Xpert Xpress SARS-CoV-2/FLU/RSV testing.  Fact Sheet for Patients: EntrepreneurPulse.com.au  Fact Sheet for Healthcare Providers: IncredibleEmployment.be  This test is not yet approved or cleared by the [place] FDA and has been authorized for detection and/or diagnosis of SARS-CoV-2 by FDA under an Emergency Use Authorization (EUA). This EUA will remain in effect (meaning this test can be used) for the duration of the COVID-19 declaration under Section 564(b)(1) of the Act, 21 U.S.C. section 360bbb-3(b)(1), unless the authorization is terminated or revoked.  Performed at [facility], [address], [place], [place] [address]   Surgical pcr screen     Status: None   Collection Time: 04/01/21  8:41 PM   Specimen: Nasal Mucosa; Nasal Swab  Result Value Ref Range Status   MRSA, PCR NEGATIVE NEGATIVE Final   Staphylococcus aureus NEGATIVE NEGATIVE Final    Comment: (NOTE) The Xpert SA Assay (FDA approved for NASAL specimens in patients 15 years of age and older), is one component of a comprehensive surveillance program. It is not intended to diagnose infection nor to guide or monitor treatment. Performed at [facility], [place] [address], [place], [place] [address]  Radiology Studies: MR BRAIN W WO CONTRAST  Result Date: 04/06/2021 CLINICAL DATA:  Dizziness, nonspecific. EXAM: MRI HEAD WITHOUT AND WITH CONTRAST TECHNIQUE: Multiplanar, multiecho pulse sequences of the brain and surrounding structures were obtained without and with intravenous contrast. CONTRAST:  81mL GADAVIST GADOBUTROL 1 MMOL/ML IV SOLN COMPARISON:  Head CT 03/31/2021. FINDINGS: Brain: Mild intermittent motion degradation. Mild generalized cerebral and cerebellar atrophy. No cortical encephalomalacia is identified. No significant cerebral white matter disease for age. There is no acute infarct. No evidence of an intracranial  mass. No chronic intracranial blood products. No extra-axial fluid collection. No midline shift. No pathologic intracranial enhancement identified. Vascular: Maintained flow voids within the proximal large arterial vessels. Skull and upper cervical spine: No focal suspicious marrow lesion. Incompletely assessed cervical spondylosis Sinuses/Orbits: Visualized orbits show no acute finding. Bilateral lens replacements. Minimal mucosal thickening within the left ethmoid air cells. IMPRESSION: Mildly motion degraded exam. Unremarkable MRI appearance of the brain for age. No evidence of acute intracranial abnormality. Minimal left ethmoid sinus mucosal thickening. Electronically Signed   By: Kellie Simmering D.O.   On: 04/06/2021 11:28        Scheduled Meds:  docusate sodium  100 mg Oral BID   ferrous sulfate  325 mg Oral TID PC   fluticasone  1 spray Each Nare Daily   fluticasone furoate-vilanterol  1 puff Inhalation Daily   irbesartan  75 mg Oral Daily   loratadine  10 mg Oral Daily   montelukast  10 mg Oral QHS   pantoprazole  40 mg Oral Daily   pramipexole  0.375 mg Oral QHS   rivaroxaban  10 mg Oral Daily   rosuvastatin  5 mg Oral QHS   senna-docusate  2 tablet Oral BID   Continuous Infusions:  sodium chloride 75 mL/hr at 04/07/21 0034   methocarbamol (ROBAXIN) IV 500 mg (04/02/21 1557)     LOS: 7 days    Time spent: 38 minutes spent on chart review, discussion with nursing staff, consultants, updating family and interview/physical exam; more than 50% of that time was spent in counseling and/or coordination of care.    Farhad Burleson J [place] DO [facility] Available via Epic secure chat 7am-7pm After these hours, please refer to coverage provider listed on [email or web address] 04/07/2021, 9:39 AM

## 2021-04-07 NOTE — Plan of Care (Signed)
  Problem: Clinical Measurements: Goal: Ability to maintain clinical measurements within normal limits will improve Outcome: Progressing   Problem: Activity: Goal: Risk for activity intolerance will decrease Outcome: Progressing   Problem: Coping: Goal: Level of anxiety will decrease Outcome: Progressing   Problem: Elimination: Goal: Will not experience complications related to bowel motility Outcome: Progressing

## 2021-04-08 DIAGNOSIS — N183 Chronic kidney disease, stage 3 unspecified: Secondary | ICD-10-CM | POA: Diagnosis not present

## 2021-04-08 DIAGNOSIS — S72044A Nondisplaced fracture of base of neck of right femur, initial encounter for closed fracture: Secondary | ICD-10-CM | POA: Diagnosis not present

## 2021-04-08 DIAGNOSIS — I5032 Chronic diastolic (congestive) heart failure: Secondary | ICD-10-CM | POA: Diagnosis not present

## 2021-04-08 DIAGNOSIS — I2510 Atherosclerotic heart disease of native coronary artery without angina pectoris: Secondary | ICD-10-CM | POA: Diagnosis not present

## 2021-04-08 NOTE — Progress Notes (Signed)
Physical Therapy Treatment Patient Details Name: [name] MRN: [id number] DOB: 01-28-1933 Today's Date: 04/08/2021   History of Present Illness [name] is a 87 y.o. female with a history of DVT on chronic anticoagulation, hyperlipidemia, hypertension, history of breast cancer, stage II chronic kidney disease, asthma, rheumatoid arthritis. Patient fell at church and sustained Right hip displaced femoral neck fracture. She is now s/p R THA via anterior approach. CT of head unremarkable. MRI done 11/26 d/t pt with c/o nausea and blurry vision, results pending    PT Comments    Pt continues to progress, tolerating incr gait distance. Steady with use of RW, no LOB. Demonstrates good carryover with all aspects of mobility. We reviewed/practiced step yesterday. Again, pt is ready to d/c home from PT standpoint.    Recommendations for follow up therapy are one component of a multi-disciplinary discharge planning process, led by the attending physician.  Recommendations may be updated based on patient status, additional functional criteria and insurance authorization.  Follow Up Recommendations  Home health PT     Assistance Recommended at Discharge Intermittent Supervision/Assistance  Equipment Recommendations  None recommended by PT    Recommendations for Other Services       Precautions / Restrictions Precautions Precautions: Fall Precaution Comments: pain in shoulder/neck, hx of R shoulder ORIF Restrictions Weight Bearing Restrictions: No Other Position/Activity Restrictions: WBAT     Mobility  Bed Mobility               General bed mobility comments: in chair on arrival    Transfers Overall transfer level: Needs assistance Equipment used: Rolling walker (2 wheels) Transfers: Sit to/from Stand Sit to Stand: Supervision;Modified independent (Device/Increase time)           General transfer comment: uses correct hand placement and controls descent     Ambulation/Gait Ambulation/Gait assistance: Supervision;Min guard Gait Distance (Feet): 175 Feet Assistive device: Rolling walker (2 wheels) Gait Pattern/deviations: Step-through pattern;Decreased stride length Gait velocity: decr     General Gait Details: cues for trunk and hip extension. steady gait with RW, no LOB   Stairs             Wheelchair Mobility    Modified Rankin (Stroke Patients Only)       Balance                                            Cognition Arousal/Alertness: Awake/alert Behavior During Therapy: WFL for tasks assessed/performed Overall Cognitive Status: Within Functional Limits for tasks assessed                                          Exercises Total Joint Exercises Ankle Circles/Pumps: AROM;Both;20 reps;Supine Quad Sets: AROM;Right;10 reps;Supine Gluteal Sets: AROM;Both;10 reps Heel Slides: AROM;AAROM;Right;10 reps Hip ABduction/ADduction: AROM;Right;10 reps    General Comments        Pertinent Vitals/Pain Pain Assessment: 0-10 Pain Score: 4  Pain Location: back adn stomach Pain Descriptors / Indicators: Sore;Grimacing Pain Intervention(s): Limited activity within patient's tolerance;Monitored during session;Premedicated before session;RN gave pain meds during session;Repositioned    Home Living                          Prior Function  PT Goals (current goals can now be found in the care plan section) Acute Rehab PT Goals PT Goal Formulation: With patient Time For Goal Achievement: 04/10/21 Potential to Achieve Goals: Good Progress towards PT goals: Progressing toward goals    Frequency    Min 5X/week      PT Plan Current plan remains appropriate    Co-evaluation              AM-PAC PT "6 Clicks" Mobility   Outcome Measure  Help needed turning from your back to your side while in a flat bed without using bedrails?: None Help needed moving  from lying on your back to sitting on the side of a flat bed without using bedrails?: None Help needed moving to and from a bed to a chair (including a wheelchair)?: None Help needed standing up from a chair using your arms (e.g., wheelchair or bedside chair)?: A Little Help needed to walk in hospital room?: A Little Help needed climbing 3-5 steps with a railing? : A Little 6 Click Score: 21    End of Session Equipment Utilized During Treatment: Gait belt Activity Tolerance: Patient tolerated treatment well Patient left: with call bell/phone within reach;in chair;with chair alarm set Nurse Communication: Mobility status PT Visit Diagnosis: Other abnormalities of gait and mobility (R26.89)     Time: 7939-0300 PT Time Calculation (min) (ACUTE ONLY): 33 min  Charges:  $Gait Training: 8-22 mins $Therapeutic Exercise: 8-22 mins                     Baxter Flattery, PT  Acute Rehab Dept ([place]) [phone] Pager [phone]  04/08/2021    [facility] 04/08/2021, 11:13 AM

## 2021-04-08 NOTE — Progress Notes (Signed)
Occupational Therapy Treatment Patient Details Name: [name] MRN: [id number] DOB: May 08, 1933 Today's Date: 04/08/2021   History of present illness [name] is a 87 y.o. female with a history of DVT on chronic anticoagulation, hyperlipidemia, hypertension, history of breast cancer, stage II chronic kidney disease, asthma, rheumatoid arthritis. Patient fell at church and sustained Right hip displaced femoral neck fracture. She is now s/p R THA via anterior approach. CT of head unremarkable. MRI done 11/26 d/t pt with c/o nausea and blurry vision, results pending   OT comments  Patient completed bathing/dressing tasks standing at sink with supervision and min A to complete LB dressing tasks secondary to no AE available. Patient has AE at home. Patient reported new onset of back pain during session. Nurse made aware. Patient's discharge plan remains appropriate at this time. OT will continue to follow acutely.     Recommendations for follow up therapy are one component of a multi-disciplinary discharge planning process, led by the attending physician.  Recommendations may be updated based on patient status, additional functional criteria and insurance authorization.    Follow Up Recommendations  Home health OT    Assistance Recommended at Discharge Frequent or constant Supervision/Assistance  Equipment Recommendations  None recommended by OT    Recommendations for Other Services      Precautions / Restrictions Precautions Precautions: Fall Precaution Comments: pain in shoulder/neck, hx of R shoulder ORIF Restrictions Weight Bearing Restrictions: No Other Position/Activity Restrictions: WBAT       Mobility Bed Mobility Overal bed mobility: Needs Assistance Bed Mobility: Supine to Sit     Supine to sit: Supervision;HOB elevated (HOB 30 degrees)     General bed mobility comments: with gait belt as leg lifter.    Transfers Overall transfer level: Needs  assistance Equipment used: Rolling walker (2 wheels) Transfers: Sit to/from Stand Sit to Stand: Supervision;Modified independent (Device/Increase time)           General transfer comment: uses correct hand placement and controls descent     Balance Overall balance assessment: History of Falls Sitting-balance support: No upper extremity supported;Feet supported Sitting balance-Leahy Scale: Good     Standing balance support: No upper extremity supported;During functional activity Standing balance-Leahy Scale: Fair                             ADL either performed or assessed with clinical judgement   ADL Overall ADL's : Needs assistance/impaired             Lower Body Bathing: Minimal assistance;Sit to/from stand;Sitting/lateral leans Lower Body Bathing Details (indicate cue type and reason): would be MI with AE she has at home Upper Body Dressing : Supervision/safety;Standing   Lower Body Dressing: Minimal assistance;Sit to/from stand;Sitting/lateral leans Lower Body Dressing Details (indicate cue type and reason): patient has reacher and AE at home per patient report. patietn needed min A to clear feet then was able to pull pants up rest of way Toilet Transfer: Supervision/safety;Rolling walker (2 wheels);Regular Toilet;Ambulation   Toileting- Clothing Manipulation and Hygiene: Supervision/safety;Sit to/from stand;Sitting/lateral lean       Functional mobility during ADLs: Supervision/safety;Rolling walker (2 wheels)      Extremity/Trunk Assessment              Vision       Perception     Praxis      Cognition Arousal/Alertness: Awake/alert Behavior During Therapy: WFL for tasks assessed/performed Overall Cognitive Status: Within Functional Limits  for tasks assessed                                            Exercises Total Joint Exercises Ankle Circles/Pumps: AROM;Both;20 reps;Supine Quad Sets: AROM;Right;10  reps;Supine Gluteal Sets: AROM;Both;10 reps Heel Slides: AROM;AAROM;Right;10 reps Hip ABduction/ADduction: AROM;Right;10 reps   Shoulder Instructions       General Comments      Pertinent Vitals/ Pain       Pain Assessment: Faces Pain Score: 4  Faces Pain Scale: Hurts little more Pain Location: back and stomach Pain Descriptors / Indicators: Sore;Grimacing Pain Intervention(s): Monitored during session;Limited activity within patient's tolerance  Home Living                                          Prior Functioning/Environment              Frequency  Min 2X/week        Progress Toward Goals  OT Goals(current goals can now be found in the care plan section)  Progress towards OT goals: Progressing toward goals     Plan Discharge plan remains appropriate    Co-evaluation                 AM-PAC OT "6 Clicks" Daily Activity     Outcome Measure   Help from another person eating meals?: None Help from another person taking care of personal grooming?: None Help from another person toileting, which includes using toliet, bedpan, or urinal?: None Help from another person bathing (including washing, rinsing, drying)?: A Little Help from another person to put on and taking off regular upper body clothing?: None Help from another person to put on and taking off regular lower body clothing?: A Little 6 Click Score: 22    End of Session Equipment Utilized During Treatment: Rolling walker (2 wheels)  OT Visit Diagnosis: Pain;History of falling (Z91.81)   Activity Tolerance Patient tolerated treatment well   Patient Left in chair;with call bell/phone within reach;with chair alarm set   Nurse Communication Mobility status;Patient requests pain meds        Time: 4709-6283 OT Time Calculation (min): 40 min  Charges: OT General Charges $OT Visit: 1 Visit OT Treatments $Self Care/Home Management : 38-52 mins  Jackelyn Poling OTR/L,  MS Acute Rehabilitation Department Office# [phone] Pager# [phone]   Marcellina Millin 04/08/2021, 12:06 PM

## 2021-04-08 NOTE — Care Management Important Message (Signed)
Important Message  Patient Details IM Letter given to the Patient. Name: [name] MRN: [id number] Date of Birth: 10-Jul-1932   Medicare Important Message Given:  Yes     Kerin Salen 04/08/2021, 12:39 PM

## 2021-04-08 NOTE — Progress Notes (Signed)
PROGRESS NOTE    [name]  ACZ:[id number] DOB: July 30, 1932 DOA: 03/31/2021 PCP: Crist Infante, MD    Brief Narrative:  [name] is a 87 year old female with past medical history significant for DVT on Xarelto, hyperlipidemia, essential hypertension, breast cancer, CKD stage IIIb, asthma, rheumatoid arthritis who presents to [facility] ED on 11/20 following mechanical fall while putting up decorations at church.  Patient developed severe right-sided pain in her right hip and unable to bear weight.  Imaging in the ED notable for right femoral neck fracture.  Orthopedics was consulted.  Hospitalist service consulted for further evaluation and management.   Assessment & Plan:   Principal Problem:   Femur fracture, right (HCC) Active Problems:   GERD   History of DVT of lower extremity   CKD (chronic kidney disease) stage 3, GFR 30-59 ml/min (HCC)   CAD (coronary artery disease), native coronary artery   Chronic diastolic CHF (congestive heart failure) ([place])   Essential hypertension   Right femoral neck fracture s/p total hip arthroplasty Patient presenting to ED following mechanical fall with acute right hip pain with inability ambulate.  Right hip x-ray with subcapital right femoral neck fracture with mild impaction and valgus angulation.  Orthopedics was consulted and patient underwent total hip arthroplasty on 11/23 by Dr. Alvan Dame. --Continue postoperative DVT prophylaxis with Xarelto --Norco 5-325 mg 1-2 tablets q4h PRN moderate/severe pain --Morphine 0.5-1.0 mg  --Robaxin 500 mg PO q6h PRN muscle spasms --PT/OT recommending home health, continue therapy efforts while inpatient --Outpatient follow-up with orthopedics  Right shoulder pain secondary to contusion from fall Continues to complain of right shoulder pain.  X-rays negative for fracture/bony injury. --Continue pain control, ice to affected area --PT/OT  Visual disturbance Patient reports visual disturbance, while  reading the lines seem to "blur".  Patient did strike head due to events from fall leading to hospitalization.  CT head on admission with soft tissue contusion right parietal scalp with no acute intracranial pathology.  Given her continued disturbance with vision and nausea/vomiting, MR brain was performed on 04/06/2021 with no evidence of acute intracranial abnormality. --Continue symptom control with antiemetics, IV fluid hydration as needed --Continue therapy efforts  Essential hypertension --Irbesartan 75 mg p.o. daily (telmesartan outpatient)  Chronic diastolic congestive heart failure, compensated --Strict I's and O's and daily weights  CKD stage IIIb --Creatinine 1.27, stable.  Asthma --Breo Ellipta 1 puff daily (on symbicort outpatient) --Singulair 10 mg p.o. nightly --Albuterol every 6 hours as needed wheezing/shortness of breath  Hyperlipidemia: Crestor 5 mg p.o. daily  GERD: Continue PPI  Iron deficiency anemia:  --Ferrous sulfate 325 mg p.o. 3 times daily  Generalized ill feeling with congestion: Patient reports her husband is now home is sick.  Apparently he had a COVID exposure on 04/04/21; and he returned positive for COVID on 04/07/2021 and is currently on 5-day quarantine at home.  Patient has been feeling ill, dizzy with congestion over the last 2 days; but COVID-19 PCR for patients negative on 04/07/2021 --Obtain COVID-19 PCR today --Placed on airborne/contact isolation for patient's recent exposure --Patient states cannot discharge home until husband comes off her quarantine as he is currently ill and unable to assist her at home, also reports has a very small home and unable to quarantine appropriately with others present.   DVT prophylaxis: rivaroxaban (XARELTO) tablet 10 mg Start: 04/03/21 1000 SCDs Start: 04/02/21 1748 Place TED hose Start: 04/02/21 1748 SCDs Start: 03/31/21 2320 rivaroxaban (XARELTO) tablet 10 mg    Code Status: Full  Code Family  Communication: No family present at bedside this morning  Disposition Plan:  Level of care: Med-Surg Status is: Inpatient  Remains inpatient appropriate because: Continue PT/OT efforts, anticipate likely discharge home with home health when able to ambulate more and pain better controlled    Consultants:  Orthopedics  Procedures:  Total hip arthroplasty, Dr. Alvan Dame 11/22  Antimicrobials:  Perioperative cefazolin   Subjective: Patient seen examined bedside, resting comfortably.  Lying in bed.  Patient reports her husband tested positive for COVID yesterday and currently on 5-day quarantine/isolation.  States she cannot return home due to the small size of her home and that he is sick and cannot assist her at this time.  Hip pain well controlled, continues with some mild pain to her right shoulder.  No other questions or concerns at this time.  Denies headache, no dizziness, no chest pain, no palpitations, no abdominal pain, no fever/chills/night sweats, no nausea/vomiting/diarrhea.  No acute events overnight per nursing staff.  Objective: Vitals:   04/07/21 2111 04/07/21 2320 04/08/21 0554 04/08/21 0804  BP: (!) 137/46  (!) 168/72   Pulse: 83  82   Resp: 18 18 16    Temp: 98.9 F (37.2 C)  98.7 F (37.1 C)   TempSrc: Oral  Oral   SpO2: 96% 96% 97% 96%  Weight:      Height:        Intake/Output Summary (Last 24 hours) at 04/08/2021 1039 Last data filed at 04/07/2021 2111 Gross per 24 hour  Intake 825.41 ml  Output 600 ml  Net 225.41 ml   Filed Weights   03/31/21 1424  Weight: 63.5 kg    Examination:  General exam: Appears calm and comfortable  Respiratory system: Clear to auscultation. Respiratory effort normal.  On room air Cardiovascular system: S1 & S2 heard, RRR. No JVD, murmurs, rubs, gallops or clicks. No pedal edema. Gastrointestinal system: Abdomen is nondistended, soft and nontender. No organomegaly or masses felt. Normal bowel sounds heard. Central nervous  system: Alert and oriented. No focal neurological deficits. Extremities: Symmetric 5 x 5 power.  Surgical dressing noted, clean/dry/intact, right shoulder with ecchymosis Skin: No rashes, lesions or ulcers Psychiatry: Judgement and insight appear normal. Mood & affect appropriate.     Data Reviewed: I have personally reviewed following labs and imaging studies  CBC: Recent Labs  Lab 04/03/21 0312 04/04/21 0323 04/06/21 0312  WBC 6.7 10.1 5.6  HGB 10.0* 11.2* 9.6*  HCT 31.2* 34.6* 29.8*  MCV 91.8 91.1 93.4  PLT 157 272 782   Basic Metabolic Panel: Recent Labs  Lab 04/03/21 0312 04/04/21 0323 04/06/21 0312  NA 127* 131* 133*  K 4.2 4.5 3.9  CL 93* 94* 98  CO2 25 26 28   GLUCOSE 164* 146* 127*  BUN 22 32* 30*  CREATININE 1.22* 1.50* 1.27*  CALCIUM 7.6* 8.2* 8.3*   GFR: Estimated Creatinine Clearance: 28.6 mL/min (A) (by C-G formula based on SCr of 1.27 mg/dL (H)). Liver Function Tests: No results for input(s): AST, ALT, ALKPHOS, BILITOT, PROT, ALBUMIN in the last 168 hours. No results for input(s): LIPASE, AMYLASE in the last 168 hours. No results for input(s): AMMONIA in the last 168 hours. Coagulation Profile: No results for input(s): INR, PROTIME in the last 168 hours.  Cardiac Enzymes: No results for input(s): CKTOTAL, CKMB, CKMBINDEX, TROPONINI in the last 168 hours. BNP (last 3 results) No results for input(s): PROBNP in the last 8760 hours. HbA1C: No results for input(s): HGBA1C in the last  72 hours. CBG: No results for input(s): GLUCAP in the last 168 hours. Lipid Profile: No results for input(s): CHOL, HDL, LDLCALC, TRIG, CHOLHDL, LDLDIRECT in the last 72 hours. Thyroid Function Tests: No results for input(s): TSH, T4TOTAL, FREET4, T3FREE, THYROIDAB in the last 72 hours. Anemia Panel: No results for input(s): VITAMINB12, FOLATE, FERRITIN, TIBC, IRON, RETICCTPCT in the last 72 hours. Sepsis Labs: No results for input(s): PROCALCITON, LATICACIDVEN in the  last 168 hours.  Recent Results (from the past 240 hour(s))  Resp Panel by RT-PCR (Flu A&B, Covid) Nasopharyngeal Swab     Status: None   Collection Time: 03/31/21  4:44 PM   Specimen: Nasopharyngeal Swab; Nasopharyngeal(NP) swabs in vial transport medium  Result Value Ref Range Status   SARS Coronavirus 2 by RT PCR NEGATIVE NEGATIVE Final    Comment: (NOTE) SARS-CoV-2 target nucleic acids are NOT DETECTED.  The SARS-CoV-2 RNA is generally detectable in upper respiratory specimens during the acute phase of infection. The lowest concentration of SARS-CoV-2 viral copies this assay can detect is 138 copies/mL. A negative result does not preclude SARS-Cov-2 infection and should not be used as the sole basis for treatment or other patient management decisions. A negative result may occur with  improper specimen collection/handling, submission of specimen other than nasopharyngeal swab, presence of viral mutation(s) within the areas targeted by this assay, and inadequate number of viral copies(<138 copies/mL). A negative result must be combined with clinical observations, patient history, and epidemiological information. The expected result is Negative.  Fact Sheet for Patients:  EntrepreneurPulse.com.au  Fact Sheet for Healthcare Providers:  IncredibleEmployment.be  This test is no t yet approved or cleared by the [place] FDA and  has been authorized for detection and/or diagnosis of SARS-CoV-2 by FDA under an Emergency Use Authorization (EUA). This EUA will remain  in effect (meaning this test can be used) for the duration of the COVID-19 declaration under Section 564(b)(1) of the Act, 21 U.S.C.section 360bbb-3(b)(1), unless the authorization is terminated  or revoked sooner.       Influenza A by PCR NEGATIVE NEGATIVE Final   Influenza B by PCR NEGATIVE NEGATIVE Final    Comment: (NOTE) The Xpert Xpress SARS-CoV-2/FLU/RSV plus assay is  intended as an aid in the diagnosis of influenza from Nasopharyngeal swab specimens and should not be used as a sole basis for treatment. Nasal washings and aspirates are unacceptable for Xpert Xpress SARS-CoV-2/FLU/RSV testing.  Fact Sheet for Patients: EntrepreneurPulse.com.au  Fact Sheet for Healthcare Providers: IncredibleEmployment.be  This test is not yet approved or cleared by the [place] FDA and has been authorized for detection and/or diagnosis of SARS-CoV-2 by FDA under an Emergency Use Authorization (EUA). This EUA will remain in effect (meaning this test can be used) for the duration of the COVID-19 declaration under Section 564(b)(1) of the Act, 21 U.S.C. section 360bbb-3(b)(1), unless the authorization is terminated or revoked.  Performed at [facility], [address], [place], [place] [address]   Surgical pcr screen     Status: None   Collection Time: 04/01/21  8:41 PM   Specimen: Nasal Mucosa; Nasal Swab  Result Value Ref Range Status   MRSA, PCR NEGATIVE NEGATIVE Final   Staphylococcus aureus NEGATIVE NEGATIVE Final    Comment: (NOTE) The Xpert SA Assay (FDA approved for NASAL specimens in patients 23 years of age and older), is one component of a comprehensive surveillance program. It is not intended to diagnose infection nor to guide or monitor treatment. Performed at  [facility], [place] [address] [place], [place] [address]   Resp Panel by RT-PCR (Flu A&B, Covid) Nasopharyngeal Swab     Status: None   Collection Time: 04/07/21  8:59 AM   Specimen: Nasopharyngeal Swab; Nasopharyngeal(NP) swabs in vial transport medium  Result Value Ref Range Status   SARS Coronavirus 2 by RT PCR NEGATIVE NEGATIVE Final    Comment: (NOTE) SARS-CoV-2 target nucleic acids are NOT DETECTED.  The SARS-CoV-2 RNA is generally detectable in upper respiratory specimens during the acute phase of infection. The  lowest concentration of SARS-CoV-2 viral copies this assay can detect is 138 copies/mL. A negative result does not preclude SARS-Cov-2 infection and should not be used as the sole basis for treatment or other patient management decisions. A negative result may occur with  improper specimen collection/handling, submission of specimen other than nasopharyngeal swab, presence of viral mutation(s) within the areas targeted by this assay, and inadequate number of viral copies(<138 copies/mL). A negative result must be combined with clinical observations, patient history, and epidemiological information. The expected result is Negative.  Fact Sheet for Patients:  EntrepreneurPulse.com.au  Fact Sheet for Healthcare Providers:  IncredibleEmployment.be  This test is no t yet approved or cleared by the [place] FDA and  has been authorized for detection and/or diagnosis of SARS-CoV-2 by FDA under an Emergency Use Authorization (EUA). This EUA will remain  in effect (meaning this test can be used) for the duration of the COVID-19 declaration under Section 564(b)(1) of the Act, 21 U.S.C.section 360bbb-3(b)(1), unless the authorization is terminated  or revoked sooner.       Influenza A by PCR NEGATIVE NEGATIVE Final   Influenza B by PCR NEGATIVE NEGATIVE Final    Comment: (NOTE) The Xpert Xpress SARS-CoV-2/FLU/RSV plus assay is intended as an aid in the diagnosis of influenza from Nasopharyngeal swab specimens and should not be used as a sole basis for treatment. Nasal washings and aspirates are unacceptable for Xpert Xpress SARS-CoV-2/FLU/RSV testing.  Fact Sheet for Patients: EntrepreneurPulse.com.au  Fact Sheet for Healthcare Providers: IncredibleEmployment.be  This test is not yet approved or cleared by the [place] FDA and has been authorized for detection and/or diagnosis of SARS-CoV-2 by FDA under  an Emergency Use Authorization (EUA). This EUA will remain in effect (meaning this test can be used) for the duration of the COVID-19 declaration under Section 564(b)(1) of the Act, 21 U.S.C. section 360bbb-3(b)(1), unless the authorization is terminated or revoked.  Performed at [facility], [place] [address], [place], [place] [address]          Radiology Studies: MR BRAIN W WO CONTRAST  Result Date: 04/06/2021 CLINICAL DATA:  Dizziness, nonspecific. EXAM: MRI HEAD WITHOUT AND WITH CONTRAST TECHNIQUE: Multiplanar, multiecho pulse sequences of the brain and surrounding structures were obtained without and with intravenous contrast. CONTRAST:  67mL GADAVIST GADOBUTROL 1 MMOL/ML IV SOLN COMPARISON:  Head CT 03/31/2021. FINDINGS: Brain: Mild intermittent motion degradation. Mild generalized cerebral and cerebellar atrophy. No cortical encephalomalacia is identified. No significant cerebral white matter disease for age. There is no acute infarct. No evidence of an intracranial mass. No chronic intracranial blood products. No extra-axial fluid collection. No midline shift. No pathologic intracranial enhancement identified. Vascular: Maintained flow voids within the proximal large arterial vessels. Skull and upper cervical spine: No focal suspicious marrow lesion. Incompletely assessed cervical spondylosis Sinuses/Orbits: Visualized orbits show no acute finding. Bilateral lens replacements. Minimal mucosal thickening within the left ethmoid air cells. IMPRESSION: Mildly  motion degraded exam. Unremarkable MRI appearance of the brain for age. No evidence of acute intracranial abnormality. Minimal left ethmoid sinus mucosal thickening. Electronically Signed   By: Kellie Simmering D.O.   On: 04/06/2021 11:28        Scheduled Meds:  docusate sodium  100 mg Oral BID   ferrous sulfate  325 mg Oral TID PC   fluticasone  1 spray Each Nare Daily   fluticasone furoate-vilanterol  1 puff  Inhalation Daily   irbesartan  75 mg Oral Daily   loratadine  10 mg Oral Daily   montelukast  10 mg Oral QHS   pantoprazole  40 mg Oral Daily   pramipexole  0.375 mg Oral QHS   rivaroxaban  10 mg Oral Daily   rosuvastatin  5 mg Oral QHS   senna-docusate  2 tablet Oral BID   Continuous Infusions:  methocarbamol (ROBAXIN) IV 500 mg (04/02/21 1557)     LOS: 8 days    Time spent: 35 minutes spent on chart review, discussion with nursing staff, consultants, updating family and interview/physical exam; more than 50% of that time was spent in counseling and/or coordination of care.    Derrian Poli J [place] DO [facility] Available via Epic secure chat 7am-7pm After these hours, please refer to coverage provider listed on [email or web address] 04/08/2021, 10:39 AM

## 2021-04-08 NOTE — Discharge Summary (Signed)
Physician Discharge Summary  [name] HKV:[id number] DOB: Jan 16, 1933 DOA: 03/31/2021  PCP: Crist Infante, MD  Admit date: 03/31/2021 Discharge date: 04/08/2021  Admitted From:  Disposition:    Recommendations for Outpatient Follow-up:  Follow up with PCP in 1-2 weeks Outpatient follow-up with orthopedics, Dr. Alvan Dame in 2 weeks for wound check  Home Health: PT/OT Equipment/Devices: None  Discharge Condition: Stable CODE STATUS: Full code Diet recommendation: Heart healthy diet  History of present illness:  [name] is a 87 year old female with past medical history significant for DVT on Xarelto, hyperlipidemia, essential hypertension, breast cancer, CKD stage IIIb, asthma, rheumatoid arthritis who presents to [facility] ED on 11/20 following mechanical fall while putting up decorations at church.  Patient developed severe right-sided pain in her right hip and unable to bear weight.  Imaging in the ED notable for right femoral neck fracture.  Orthopedics was consulted.  Hospitalist service consulted for further evaluation and management.  Hospital course:  Right femoral neck fracture s/p total hip arthroplasty Patient presenting to ED following mechanical fall with acute right hip pain with inability ambulate.  Right hip x-ray with subcapital right femoral neck fracture with mild impaction and valgus angulation.  Orthopedics was consulted and patient underwent total hip arthroplasty on 11/23 by Dr. Alvan Dame.  Continue postoperative DVT prophylaxis with Xarelto.  Norco as needed for pain control.  Discharging with home health PT/OT.  Outpatient follow-up with orthopedics.   Right shoulder pain secondary to contusion from fall Continues to complain of right shoulder pain.  X-rays negative for fracture/bony injury.  Continue home health PT.   Visual disturbance Patient reports visual disturbance, while reading the lines seem to "blur".  Patient did strike head due to events from fall  leading to hospitalization.  CT head on admission with soft tissue contusion right parietal scalp with no acute intracranial pathology.  Given her continued disturbance with vision and nausea/vomiting, MR brain was performed on 04/06/2021 with no evidence of acute intracranial abnormality.  Outpatient follow-up with PCP, optometry.   Essential hypertension Continue home telmisartan.   Chronic diastolic congestive heart failure, compensated Continue home telmisartan and Lasix as needed.   CKD stage IIIb Creatinine 1.27, stable.   Asthma Continue home Symbicort and Singulair.   Hyperlipidemia: Crestor 5 mg p.o. daily   GERD: Continue PPI   Iron deficiency anemia:  Ferrous sulfate 325 mg p.o. 3 times daily      Discharge Diagnoses:  Principal Problem:   Femur fracture, right (HCC) Active Problems:   GERD   History of DVT of lower extremity   CKD (chronic kidney disease) stage 3, GFR 30-59 ml/min (HCC)   CAD (coronary artery disease), native coronary artery   Chronic diastolic CHF (congestive heart failure) ([place])   Essential hypertension    Discharge Instructions  Discharge Instructions     Call MD for:  difficulty breathing, headache or visual disturbances   Complete by: As directed    Call MD for:  extreme fatigue   Complete by: As directed    Call MD for:  persistant dizziness or light-headedness   Complete by: As directed    Call MD for:  persistant nausea and vomiting   Complete by: As directed    Call MD for:  severe uncontrolled pain   Complete by: As directed    Call MD for:  temperature >100.4   Complete by: As directed    Diet - low sodium heart healthy   Complete by: As directed  Increase activity slowly   Complete by: As directed    No wound care   Complete by: As directed       Allergies as of 04/08/2021       Reactions   Codeine Nausea And Vomiting   Egg Phospholipids    Eggs Or Egg-derived Products    UNSPECIFIED REACTION  RAW EGGS..  Can eat cooked eggs   Influenza Vaccines    UNSPECIFIED REACTION  Egg allergy   Nucala [mepolizumab]    UNSPECIFIED REACTION    Cephalosporins Rash   Gabapentin Nausea Only   Levofloxacin Rash   Pneumococcal Vaccines Rash        Medication List     STOP taking these medications    acetaminophen 500 MG tablet Commonly known as: TYLENOL   budesonide-formoterol 160-4.5 MCG/ACT inhaler Commonly known as: SYMBICORT   dextromethorphan-guaiFENesin 30-600 MG 12hr tablet Commonly known as: MUCINEX DM   Dupixent 300 MG/2ML Sopn Generic drug: Dupilumab   Forteo 600 MCG/2.4ML Sopn Generic drug: Teriparatide (Recombinant)   ibandronate 150 MG tablet Commonly known as: BONIVA   traMADol 50 MG tablet Commonly known as: ULTRAM       TAKE these medications    AeroChamber MV inhaler by Other route. Use as instructed   albuterol 108 (90 Base) MCG/ACT inhaler Commonly known as: ProAir HFA Inhale 2 puffs into the lungs every 6 (six) hours as needed for wheezing or shortness of breath. What changed: Another medication with the same name was removed. Continue taking this medication, and follow the directions you see here.   benzonatate 100 MG capsule Commonly known as: TESSALON Take 1 capsule (100 mg total) by mouth 3 (three) times daily as needed for cough.   Calcium Carbonate-Vitamin D 600-400 MG-UNIT tablet Take 1 tablet by mouth daily.   chlorpheniramine 4 MG tablet Commonly known as: CHLOR-TRIMETON Take 4-8 mg by mouth See admin instructions. 4 mg in the morning and 8 mg at bedtime   cholecalciferol 25 MCG (1000 UNIT) tablet Commonly known as: VITAMIN D Take 1,000 Units by mouth every evening.   dextromethorphan 30 MG/5ML liquid Commonly known as: DELSYM Take 60 mg by mouth 2 (two) times daily as needed for cough.   docusate sodium 100 MG capsule Commonly known as: COLACE Take 1 capsule (100 mg total) by mouth 2 (two) times daily.   fexofenadine 180 MG  tablet Commonly known as: ALLEGRA Take 180 mg by mouth daily.   furosemide 20 MG tablet Commonly known as: LASIX TAKE 1 TABLET (20 MG TOTAL) BY MOUTH DAILY AS NEEDED FOR FLUID OR EDEMA. What changed: See the new instructions.   HYDROcodone-acetaminophen 5-325 MG tablet Commonly known as: NORCO/VICODIN Take 1-2 tablets by mouth every 6 (six) hours as needed for severe pain.   ipratropium 0.06 % nasal spray Commonly known as: ATROVENT Place 2 sprays into both nostrils 2 (two) times daily.   methocarbamol 500 MG tablet Commonly known as: ROBAXIN Take 1 tablet (500 mg total) by mouth every 6 (six) hours as needed for muscle spasms.   mometasone 50 MCG/ACT nasal spray Commonly known as: NASONEX USE 2 SPRAYS NASALLY 2 TIMES DAILY What changed: See the new instructions.   montelukast 10 MG tablet Commonly known as: SINGULAIR TAKE 1 TABLET BY MOUTH EVERY NIGHT AT BEDTIME   multivitamin with minerals Tabs tablet Take 1 tablet by mouth daily. Centrum Silver Multivitamin   multivitamin-lutein Caps capsule Take 1 capsule by mouth daily. What changed: Another medication with the  same name was removed. Continue taking this medication, and follow the directions you see here.   mupirocin ointment 2 % Commonly known as: BACTROBAN as needed.   NexIUM 40 MG capsule Generic drug: esomeprazole TAKE 1 CAPSULE BY MOUTH TWICE A DAY   nitroGLYCERIN 0.4 MG SL tablet Commonly known as: NITROSTAT Place 1 tablet (0.4 mg total) under the tongue every 5 (five) minutes as needed for chest pain.   polyethylene glycol 17 g packet Commonly known as: MIRALAX / GLYCOLAX Take 17 g by mouth daily as needed for mild constipation.   potassium chloride 10 MEQ tablet Commonly known as: KLOR-CON TAKE 1 TABLET BY MOUTH EVERY DAY   pramipexole 0.125 MG tablet Commonly known as: MIRAPEX Take 0.375 mg by mouth at bedtime.   rivaroxaban 10 MG Tabs tablet Commonly known as: XARELTO Take 10 mg by mouth  daily.   rosuvastatin 5 MG tablet Commonly known as: CRESTOR TAKE 1 TABLET BY MOUTH EVERYDAY AT BEDTIME What changed: See the new instructions.   telmisartan 20 MG tablet Commonly known as: MICARDIS Take 20 mg by mouth daily.   vitamin B-12 500 MCG tablet Commonly known as: CYANOCOBALAMIN Take 500 mcg by mouth daily.        Follow-up Information     Paralee Cancel, MD. Schedule an appointment as soon as possible for a visit in 2 week(s).   Specialty: Orthopedic Surgery Contact information: [address] [place] [address] [phone]         [facility] Follow up.   Specialty: [place] Why: to provide home health physical and occupational therapy Contact information: [place] [address] [phone]         Crist Infante, MD. Schedule an appointment as soon as possible for a visit in 1 week(s).   Specialty: Internal Medicine Contact information: [place] [address] [phone]         Nahser, Wonda Cheng, MD .   Specialty: Cardiology Contact information: [place] [address] [place] [address] [phone]                Allergies  Allergen Reactions   Codeine Nausea And Vomiting   Egg Phospholipids    Eggs Or Egg-Derived Products     UNSPECIFIED REACTION  RAW EGGS.. Can eat cooked eggs   Influenza Vaccines     UNSPECIFIED REACTION  Egg allergy    Nucala [Mepolizumab]     UNSPECIFIED REACTION    Cephalosporins Rash   Gabapentin Nausea Only   Levofloxacin Rash   Pneumococcal Vaccines Rash    Consultations: [facility] - Dr. Alvan Dame   Procedures/Studies: DG Shoulder Right  Result Date: 03/31/2021 CLINICAL DATA:  [place], right shoulder pain EXAM: RIGHT SHOULDER - 2+ VIEW COMPARISON:  09/07/2018 FINDINGS: Frontal and transscapular views of the right shoulder are obtained. Prior healed fracture of the greater tuberosity of the proximal  right humerus. No acute fracture, subluxation, or dislocation. Moderate spurring of the acromioclavicular joint. Marked narrowing of the acromial humeral interval consistent with chronic longstanding rotator cuff tear. Visualized portions of the right chest are clear. IMPRESSION: 1. Chronic posttraumatic and degenerative changes. No acute bony abnormality. Electronically Signed   By: Randa Ngo M.D.   On: 03/31/2021 16:02   CT Head Wo Contrast  Result Date: 03/31/2021 CLINICAL DATA:  Fall, pain EXAM: CT HEAD WITHOUT CONTRAST CT CERVICAL SPINE WITHOUT CONTRAST TECHNIQUE: Multidetector CT imaging of the head  and cervical spine was performed following the standard protocol without intravenous contrast. Multiplanar CT image reconstructions of the cervical spine were also generated. COMPARISON:  None. FINDINGS: CT HEAD FINDINGS Brain: No evidence of acute infarction, hemorrhage, hydrocephalus, extra-axial collection or mass lesion/mass effect. Vascular: No hyperdense vessel or unexpected calcification. Skull: Normal. Negative for fracture or focal lesion. Sinuses/Orbits: No acute finding. Other: Soft tissue contusion of the right parietal scalp (series 2, image 14). CT CERVICAL SPINE FINDINGS Alignment: Degenerative straightening and reversal of the normal cervical lordosis. Skull base and vertebrae: No acute fracture. No primary bone lesion or focal pathologic process. Soft tissues and spinal canal: No prevertebral fluid or swelling. No visible canal hematoma. Disc levels: Moderate multilevel disc degenerative disease and osteophytosis of C4 through C7 with otherwise preserved disc spaces. Upper chest: Negative. Other: None. IMPRESSION: 1. No acute intracranial pathology. 2. Soft tissue contusion of the right parietal scalp. 3. No fracture or static subluxation of the cervical spine. 4. Moderate multilevel disc degenerative disease and osteophytosis of C4 through C7. Electronically Signed   By: Delanna Ahmadi  M.D.   On: 03/31/2021 15:27   CT Cervical Spine Wo Contrast  Result Date: 03/31/2021 CLINICAL DATA:  Fall, pain EXAM: CT HEAD WITHOUT CONTRAST CT CERVICAL SPINE WITHOUT CONTRAST TECHNIQUE: Multidetector CT imaging of the head and cervical spine was performed following the standard protocol without intravenous contrast. Multiplanar CT image reconstructions of the cervical spine were also generated. COMPARISON:  None. FINDINGS: CT HEAD FINDINGS Brain: No evidence of acute infarction, hemorrhage, hydrocephalus, extra-axial collection or mass lesion/mass effect. Vascular: No hyperdense vessel or unexpected calcification. Skull: Normal. Negative for fracture or focal lesion. Sinuses/Orbits: No acute finding. Other: Soft tissue contusion of the right parietal scalp (series 2, image 14). CT CERVICAL SPINE FINDINGS Alignment: Degenerative straightening and reversal of the normal cervical lordosis. Skull base and vertebrae: No acute fracture. No primary bone lesion or focal pathologic process. Soft tissues and spinal canal: No prevertebral fluid or swelling. No visible canal hematoma. Disc levels: Moderate multilevel disc degenerative disease and osteophytosis of C4 through C7 with otherwise preserved disc spaces. Upper chest: Negative. Other: None. IMPRESSION: 1. No acute intracranial pathology. 2. Soft tissue contusion of the right parietal scalp. 3. No fracture or static subluxation of the cervical spine. 4. Moderate multilevel disc degenerative disease and osteophytosis of C4 through C7. Electronically Signed   By: Delanna Ahmadi M.D.   On: 03/31/2021 15:27   MR BRAIN W WO CONTRAST  Result Date: 04/06/2021 CLINICAL DATA:  Dizziness, nonspecific. EXAM: MRI HEAD WITHOUT AND WITH CONTRAST TECHNIQUE: Multiplanar, multiecho pulse sequences of the brain and surrounding structures were obtained without and with intravenous contrast. CONTRAST:  55mL GADAVIST GADOBUTROL 1 MMOL/ML IV SOLN COMPARISON:  Head CT 03/31/2021.  FINDINGS: Brain: Mild intermittent motion degradation. Mild generalized cerebral and cerebellar atrophy. No cortical encephalomalacia is identified. No significant cerebral white matter disease for age. There is no acute infarct. No evidence of an intracranial mass. No chronic intracranial blood products. No extra-axial fluid collection. No midline shift. No pathologic intracranial enhancement identified. Vascular: Maintained flow voids within the proximal large arterial vessels. Skull and upper cervical spine: No focal suspicious marrow lesion. Incompletely assessed cervical spondylosis Sinuses/Orbits: Visualized orbits show no acute finding. Bilateral lens replacements. Minimal mucosal thickening within the left ethmoid air cells. IMPRESSION: Mildly motion degraded exam. Unremarkable MRI appearance of the brain for age. No evidence of acute intracranial abnormality. Minimal left ethmoid sinus mucosal thickening. Electronically Signed  By: Kellie Simmering D.O.   On: 04/06/2021 11:28   DG Pelvis Portable  Result Date: 04/02/2021 CLINICAL DATA:  Status post right hip arthroplasty EXAM: PORTABLE PELVIS 1-2 VIEWS COMPARISON:  12/03/2017 FINDINGS: There is interval right hip arthroplasty. There is previous left hip arthroplasty. No fracture or dislocation is seen. There are pockets of air in the soft tissues in right hip from recent surgery. IMPRESSION: Interval right hip arthroplasty. No fracture is seen. Previous left hip arthroplasty. Electronically Signed   By: Elmer Picker M.D.   On: 04/02/2021 16:33   DG C-Arm 1-60 Min-No Report  Result Date: 04/02/2021 Fluoroscopy was utilized by the requesting physician.  No radiographic interpretation.   DG C-Arm 1-60 Min-No Report  Result Date: 04/02/2021 Fluoroscopy was utilized by the requesting physician.  No radiographic interpretation.   DG HIP UNILAT WITH PELVIS 2-3 VIEWS RIGHT  Result Date: 04/02/2021 CLINICAL DATA:  Right hip arthroplasty EXAM:  DG HIP (WITH OR WITHOUT PELVIS) 2-3V RIGHT COMPARISON:  03/31/2021 FINDINGS: 5 fluoroscopic images are obtained during the performance of the procedure and are provided for interpretation only. The subcapital right femoral neck fracture seen previously is identified. Subsequent placement of a right hip arthroplasty in the expected position. Please refer to operative report. FLUOROSCOPY TIME:  4 seconds IMPRESSION: 1. Right hip arthroplasty as above. Electronically Signed   By: Randa Ngo M.D.   On: 04/02/2021 15:55   DG Hip Unilat With Pelvis 2-3 Views Right  Result Date: 03/31/2021 CLINICAL DATA:  [place], right hip pain and EXAM: DG HIP (WITH OR WITHOUT PELVIS) 2-3V RIGHT COMPARISON:  09/07/2018 FINDINGS: Frontal view of the pelvis as well as frontal and cross-table lateral views of the right hip are obtained. There is a subcapital right femoral neck fracture with mild impaction and valgus angulation. No dislocation. No other acute displaced fractures. Unremarkable left hip arthroplasty. IMPRESSION: 1. Subcapital right femoral neck fracture with mild impaction and valgus angulation. Electronically Signed   By: Randa Ngo M.D.   On: 03/31/2021 16:00     Subjective: Patient seen examined bedside, resting comfortably.  Pain to hip controlled, continues with mild right shoulder pain.  Concerned about returning home with husband positive for COVID, but discussed will need suggest quarantine/isolate herself from the house.  No other questions or concerns at this time.  Denies headache, no fever/chills/night sweats, no nausea/vomiting/diarrhea, no chest pain, no palpitations, no shortness of breath, no abdominal pain.  No acute events overnight per nurse staff.  Discharge Exam: Vitals:   04/08/21 0554 04/08/21 0804  BP: (!) 168/72   Pulse: 82   Resp: 16   Temp: 98.7 F (37.1 C)   SpO2: 97% 96%   Vitals:   04/07/21 2111 04/07/21 2320 04/08/21 0554 04/08/21 0804  BP: (!) 137/46  (!) 168/72    Pulse: 83  82   Resp: 18 18 16    Temp: 98.9 F (37.2 C)  98.7 F (37.1 C)   TempSrc: Oral  Oral   SpO2: 96% 96% 97% 96%  Weight:      Height:        General: Pt is alert, awake, not in acute distress Cardiovascular: RRR, S1/S2 +, no rubs, no gallops Respiratory: CTA bilaterally, no wheezing, no rhonchi, on room air Abdominal: Soft, NT, ND, bowel sounds + Extremities: no edema, no cyanosis, surgical site noted without fluctuance, no erythema, nontender to palpation    The results of significant diagnostics from this hospitalization (including imaging, microbiology, ancillary and  laboratory) are listed below for reference.     Microbiology: Recent Results (from the past 240 hour(s))  Resp Panel by RT-PCR (Flu A&B, Covid) Nasopharyngeal Swab     Status: None   Collection Time: 03/31/21  4:44 PM   Specimen: Nasopharyngeal Swab; Nasopharyngeal(NP) swabs in vial transport medium  Result Value Ref Range Status   SARS Coronavirus 2 by RT PCR NEGATIVE NEGATIVE Final    Comment: (NOTE) SARS-CoV-2 target nucleic acids are NOT DETECTED.  The SARS-CoV-2 RNA is generally detectable in upper respiratory specimens during the acute phase of infection. The lowest concentration of SARS-CoV-2 viral copies this assay can detect is 138 copies/mL. A negative result does not preclude SARS-Cov-2 infection and should not be used as the sole basis for treatment or other patient management decisions. A negative result may occur with  improper specimen collection/handling, submission of specimen other than nasopharyngeal swab, presence of viral mutation(s) within the areas targeted by this assay, and inadequate number of viral copies(<138 copies/mL). A negative result must be combined with clinical observations, patient history, and epidemiological information. The expected result is Negative.  Fact Sheet for Patients:  EntrepreneurPulse.com.au  Fact Sheet for Healthcare  Providers:  IncredibleEmployment.be  This test is no t yet approved or cleared by the [place] FDA and  has been authorized for detection and/or diagnosis of SARS-CoV-2 by FDA under an Emergency Use Authorization (EUA). This EUA will remain  in effect (meaning this test can be used) for the duration of the COVID-19 declaration under Section 564(b)(1) of the Act, 21 U.S.C.section 360bbb-3(b)(1), unless the authorization is terminated  or revoked sooner.       Influenza A by PCR NEGATIVE NEGATIVE Final   Influenza B by PCR NEGATIVE NEGATIVE Final    Comment: (NOTE) The Xpert Xpress SARS-CoV-2/FLU/RSV plus assay is intended as an aid in the diagnosis of influenza from Nasopharyngeal swab specimens and should not be used as a sole basis for treatment. Nasal washings and aspirates are unacceptable for Xpert Xpress SARS-CoV-2/FLU/RSV testing.  Fact Sheet for Patients: EntrepreneurPulse.com.au  Fact Sheet for Healthcare Providers: IncredibleEmployment.be  This test is not yet approved or cleared by the [place] FDA and has been authorized for detection and/or diagnosis of SARS-CoV-2 by FDA under an Emergency Use Authorization (EUA). This EUA will remain in effect (meaning this test can be used) for the duration of the COVID-19 declaration under Section 564(b)(1) of the Act, 21 U.S.C. section 360bbb-3(b)(1), unless the authorization is terminated or revoked.  Performed at [facility], [address] [place], [place] [address]   Surgical pcr screen     Status: None   Collection Time: 04/01/21  8:41 PM   Specimen: Nasal Mucosa; Nasal Swab  Result Value Ref Range Status   MRSA, PCR NEGATIVE NEGATIVE Final   Staphylococcus aureus NEGATIVE NEGATIVE Final    Comment: (NOTE) The Xpert SA Assay (FDA approved for NASAL specimens in patients 32 years of age and older), is one component of a comprehensive surveillance  program. It is not intended to diagnose infection nor to guide or monitor treatment. Performed at [facility], [place] [address], [place], [place] [address]   Resp Panel by RT-PCR (Flu A&B, Covid) Nasopharyngeal Swab     Status: None   Collection Time: 04/07/21  8:59 AM   Specimen: Nasopharyngeal Swab; Nasopharyngeal(NP) swabs in vial transport medium  Result Value Ref Range Status   SARS Coronavirus 2 by RT PCR NEGATIVE NEGATIVE Final  Comment: (NOTE) SARS-CoV-2 target nucleic acids are NOT DETECTED.  The SARS-CoV-2 RNA is generally detectable in upper respiratory specimens during the acute phase of infection. The lowest concentration of SARS-CoV-2 viral copies this assay can detect is 138 copies/mL. A negative result does not preclude SARS-Cov-2 infection and should not be used as the sole basis for treatment or other patient management decisions. A negative result may occur with  improper specimen collection/handling, submission of specimen other than nasopharyngeal swab, presence of viral mutation(s) within the areas targeted by this assay, and inadequate number of viral copies(<138 copies/mL). A negative result must be combined with clinical observations, patient history, and epidemiological information. The expected result is Negative.  Fact Sheet for Patients:  EntrepreneurPulse.com.au  Fact Sheet for Healthcare Providers:  IncredibleEmployment.be  This test is no t yet approved or cleared by the [place] FDA and  has been authorized for detection and/or diagnosis of SARS-CoV-2 by FDA under an Emergency Use Authorization (EUA). This EUA will remain  in effect (meaning this test can be used) for the duration of the COVID-19 declaration under Section 564(b)(1) of the Act, 21 U.S.C.section 360bbb-3(b)(1), unless the authorization is terminated  or revoked sooner.       Influenza A by PCR NEGATIVE NEGATIVE Final    Influenza B by PCR NEGATIVE NEGATIVE Final    Comment: (NOTE) The Xpert Xpress SARS-CoV-2/FLU/RSV plus assay is intended as an aid in the diagnosis of influenza from Nasopharyngeal swab specimens and should not be used as a sole basis for treatment. Nasal washings and aspirates are unacceptable for Xpert Xpress SARS-CoV-2/FLU/RSV testing.  Fact Sheet for Patients: EntrepreneurPulse.com.au  Fact Sheet for Healthcare Providers: IncredibleEmployment.be  This test is not yet approved or cleared by the [place] FDA and has been authorized for detection and/or diagnosis of SARS-CoV-2 by FDA under an Emergency Use Authorization (EUA). This EUA will remain in effect (meaning this test can be used) for the duration of the COVID-19 declaration under Section 564(b)(1) of the Act, 21 U.S.C. section 360bbb-3(b)(1), unless the authorization is terminated or revoked.  Performed at [facility], [place] [name], [place], [place] [address]      Labs: BNP (last 3 results) No results for input(s): BNP in the last 8760 hours. Basic Metabolic Panel: Recent Labs  Lab 04/03/21 0312 04/04/21 0323 04/06/21 0312  NA 127* 131* 133*  K 4.2 4.5 3.9  CL 93* 94* 98  CO2 25 26 28   GLUCOSE 164* 146* 127*  BUN 22 32* 30*  CREATININE 1.22* 1.50* 1.27*  CALCIUM 7.6* 8.2* 8.3*   Liver Function Tests: No results for input(s): AST, ALT, ALKPHOS, BILITOT, PROT, ALBUMIN in the last 168 hours. No results for input(s): LIPASE, AMYLASE in the last 168 hours. No results for input(s): AMMONIA in the last 168 hours. CBC: Recent Labs  Lab 04/03/21 0312 04/04/21 0323 04/06/21 0312  WBC 6.7 10.1 5.6  HGB 10.0* 11.2* 9.6*  HCT 31.2* 34.6* 29.8*  MCV 91.8 91.1 93.4  PLT 157 272 250   Cardiac Enzymes: No results for input(s): CKTOTAL, CKMB, CKMBINDEX, TROPONINI in the last 168 hours. BNP: Invalid input(s): POCBNP CBG: No results for input(s):  GLUCAP in the last 168 hours. D-Dimer No results for input(s): DDIMER in the last 72 hours. Hgb A1c No results for input(s): HGBA1C in the last 72 hours. Lipid Profile No results for input(s): CHOL, HDL, LDLCALC, TRIG, CHOLHDL, LDLDIRECT in the last 72 hours. Thyroid function studies No results for input(s):  TSH, T4TOTAL, T3FREE, THYROIDAB in the last 72 hours.  Invalid input(s): FREET3 Anemia work up No results for input(s): VITAMINB12, FOLATE, FERRITIN, TIBC, IRON, RETICCTPCT in the last 72 hours. Urinalysis    Component Value Date/Time   COLORURINE YELLOW 08/22/2019 1001   APPEARANCEUR CLEAR 08/22/2019 1001   LABSPEC 1.010 08/22/2019 1001   PHURINE < OR = 5.0 08/22/2019 1001   GLUCOSEU NEGATIVE 08/22/2019 1001   HGBUR NEGATIVE 08/22/2019 1001   BILIRUBINUR NEGATIVE 12/19/2016 0522   KETONESUR NEGATIVE 08/22/2019 1001   PROTEINUR NEGATIVE 08/22/2019 1001   UROBILINOGEN 0.2 09/27/2012 1349   NITRITE NEGATIVE 08/22/2019 1001   LEUKOCYTESUR NEGATIVE 08/22/2019 1001   Sepsis Labs Invalid input(s): PROCALCITONIN,  WBC,  LACTICIDVEN Microbiology Recent Results (from the past 240 hour(s))  Resp Panel by RT-PCR (Flu A&B, Covid) Nasopharyngeal Swab     Status: None   Collection Time: 03/31/21  4:44 PM   Specimen: Nasopharyngeal Swab; Nasopharyngeal(NP) swabs in vial transport medium  Result Value Ref Range Status   SARS Coronavirus 2 by RT PCR NEGATIVE NEGATIVE Final    Comment: (NOTE) SARS-CoV-2 target nucleic acids are NOT DETECTED.  The SARS-CoV-2 RNA is generally detectable in upper respiratory specimens during the acute phase of infection. The lowest concentration of SARS-CoV-2 viral copies this assay can detect is 138 copies/mL. A negative result does not preclude SARS-Cov-2 infection and should not be used as the sole basis for treatment or other patient management decisions. A negative result may occur with  improper specimen collection/handling, submission of specimen  other than nasopharyngeal swab, presence of viral mutation(s) within the areas targeted by this assay, and inadequate number of viral copies(<138 copies/mL). A negative result must be combined with clinical observations, patient history, and epidemiological information. The expected result is Negative.  Fact Sheet for Patients:  EntrepreneurPulse.com.au  Fact Sheet for Healthcare Providers:  IncredibleEmployment.be  This test is no t yet approved or cleared by the [place] FDA and  has been authorized for detection and/or diagnosis of SARS-CoV-2 by FDA under an Emergency Use Authorization (EUA). This EUA will remain  in effect (meaning this test can be used) for the duration of the COVID-19 declaration under Section 564(b)(1) of the Act, 21 U.S.C.section 360bbb-3(b)(1), unless the authorization is terminated  or revoked sooner.       Influenza A by PCR NEGATIVE NEGATIVE Final   Influenza B by PCR NEGATIVE NEGATIVE Final    Comment: (NOTE) The Xpert Xpress SARS-CoV-2/FLU/RSV plus assay is intended as an aid in the diagnosis of influenza from Nasopharyngeal swab specimens and should not be used as a sole basis for treatment. Nasal washings and aspirates are unacceptable for Xpert Xpress SARS-CoV-2/FLU/RSV testing.  Fact Sheet for Patients: EntrepreneurPulse.com.au  Fact Sheet for Healthcare Providers: IncredibleEmployment.be  This test is not yet approved or cleared by the [place] FDA and has been authorized for detection and/or diagnosis of SARS-CoV-2 by FDA under an Emergency Use Authorization (EUA). This EUA will remain in effect (meaning this test can be used) for the duration of the COVID-19 declaration under Section 564(b)(1) of the Act, 21 U.S.C. section 360bbb-3(b)(1), unless the authorization is terminated or revoked.  Performed at [facility], [address] [place], [place]  [address]   Surgical pcr screen     Status: None   Collection Time: 04/01/21  8:41 PM   Specimen: Nasal Mucosa; Nasal Swab  Result Value Ref Range Status   MRSA, PCR NEGATIVE NEGATIVE Final   Staphylococcus aureus NEGATIVE  NEGATIVE Final    Comment: (NOTE) The Xpert SA Assay (FDA approved for NASAL specimens in patients 52 years of age and older), is one component of a comprehensive surveillance program. It is not intended to diagnose infection nor to guide or monitor treatment. Performed at [facility], [place] [address] [place], [place] [address]   Resp Panel by RT-PCR (Flu A&B, Covid) Nasopharyngeal Swab     Status: None   Collection Time: 04/07/21  8:59 AM   Specimen: Nasopharyngeal Swab; Nasopharyngeal(NP) swabs in vial transport medium  Result Value Ref Range Status   SARS Coronavirus 2 by RT PCR NEGATIVE NEGATIVE Final    Comment: (NOTE) SARS-CoV-2 target nucleic acids are NOT DETECTED.  The SARS-CoV-2 RNA is generally detectable in upper respiratory specimens during the acute phase of infection. The lowest concentration of SARS-CoV-2 viral copies this assay can detect is 138 copies/mL. A negative result does not preclude SARS-Cov-2 infection and should not be used as the sole basis for treatment or other patient management decisions. A negative result may occur with  improper specimen collection/handling, submission of specimen other than nasopharyngeal swab, presence of viral mutation(s) within the areas targeted by this assay, and inadequate number of viral copies(<138 copies/mL). A negative result must be combined with clinical observations, patient history, and epidemiological information. The expected result is Negative.  Fact Sheet for Patients:  EntrepreneurPulse.com.au  Fact Sheet for Healthcare Providers:  IncredibleEmployment.be  This test is no t yet approved or cleared by the [place] FDA and  has  been authorized for detection and/or diagnosis of SARS-CoV-2 by FDA under an Emergency Use Authorization (EUA). This EUA will remain  in effect (meaning this test can be used) for the duration of the COVID-19 declaration under Section 564(b)(1) of the Act, 21 U.S.C.section 360bbb-3(b)(1), unless the authorization is terminated  or revoked sooner.       Influenza A by PCR NEGATIVE NEGATIVE Final   Influenza B by PCR NEGATIVE NEGATIVE Final    Comment: (NOTE) The Xpert Xpress SARS-CoV-2/FLU/RSV plus assay is intended as an aid in the diagnosis of influenza from Nasopharyngeal swab specimens and should not be used as a sole basis for treatment. Nasal washings and aspirates are unacceptable for Xpert Xpress SARS-CoV-2/FLU/RSV testing.  Fact Sheet for Patients: EntrepreneurPulse.com.au  Fact Sheet for Healthcare Providers: IncredibleEmployment.be  This test is not yet approved or cleared by the [place] FDA and has been authorized for detection and/or diagnosis of SARS-CoV-2 by FDA under an Emergency Use Authorization (EUA). This EUA will remain in effect (meaning this test can be used) for the duration of the COVID-19 declaration under Section 564(b)(1) of the Act, 21 U.S.C. section 360bbb-3(b)(1), unless the authorization is terminated or revoked.  Performed at [facility], [place] [address] [place], [place] [address]      Time coordinating discharge: Over 30 minutes  SIGNED:   Oneisha Ammons J [place] DO  [facility] 04/08/2021, 11:27 AM

## 2021-04-10 ENCOUNTER — Other Ambulatory Visit: Payer: Self-pay

## 2021-04-10 ENCOUNTER — Ambulatory Visit: Payer: Medicare Other

## 2021-04-10 ENCOUNTER — Emergency Department (HOSPITAL_BASED_OUTPATIENT_CLINIC_OR_DEPARTMENT_OTHER): Payer: No Typology Code available for payment source

## 2021-04-10 ENCOUNTER — Emergency Department (HOSPITAL_BASED_OUTPATIENT_CLINIC_OR_DEPARTMENT_OTHER)
Admission: EM | Admit: 2021-04-10 | Discharge: 2021-04-11 | Disposition: A | Discharge status: Home / Self Care | Payer: No Typology Code available for payment source | Attending: Emergency Medicine | Admitting: Emergency Medicine

## 2021-04-10 ENCOUNTER — Encounter (HOSPITAL_BASED_OUTPATIENT_CLINIC_OR_DEPARTMENT_OTHER): Payer: Self-pay

## 2021-04-10 DIAGNOSIS — Z96641 Presence of right artificial hip joint: Secondary | ICD-10-CM | POA: Diagnosis not present

## 2021-04-10 DIAGNOSIS — R1084 Generalized abdominal pain: Secondary | ICD-10-CM

## 2021-04-10 DIAGNOSIS — K2900 Acute gastritis without bleeding: Secondary | ICD-10-CM | POA: Diagnosis not present

## 2021-04-10 DIAGNOSIS — Z7951 Long term (current) use of inhaled steroids: Secondary | ICD-10-CM | POA: Diagnosis not present

## 2021-04-10 DIAGNOSIS — R109 Unspecified abdominal pain: Secondary | ICD-10-CM | POA: Diagnosis not present

## 2021-04-10 DIAGNOSIS — I25110 Atherosclerotic heart disease of native coronary artery with unstable angina pectoris: Secondary | ICD-10-CM | POA: Diagnosis not present

## 2021-04-10 DIAGNOSIS — I130 Hypertensive heart and chronic kidney disease with heart failure and stage 1 through stage 4 chronic kidney disease, or unspecified chronic kidney disease: Secondary | ICD-10-CM | POA: Diagnosis not present

## 2021-04-10 DIAGNOSIS — K219 Gastro-esophageal reflux disease without esophagitis: Secondary | ICD-10-CM | POA: Insufficient documentation

## 2021-04-10 DIAGNOSIS — N183 Chronic kidney disease, stage 3 unspecified: Secondary | ICD-10-CM | POA: Insufficient documentation

## 2021-04-10 DIAGNOSIS — R112 Nausea with vomiting, unspecified: Secondary | ICD-10-CM | POA: Insufficient documentation

## 2021-04-10 DIAGNOSIS — R1110 Vomiting, unspecified: Secondary | ICD-10-CM | POA: Diagnosis not present

## 2021-04-10 DIAGNOSIS — Z7901 Long term (current) use of anticoagulants: Secondary | ICD-10-CM | POA: Diagnosis not present

## 2021-04-10 DIAGNOSIS — Z853 Personal history of malignant neoplasm of breast: Secondary | ICD-10-CM | POA: Diagnosis not present

## 2021-04-10 DIAGNOSIS — K5900 Constipation, unspecified: Secondary | ICD-10-CM | POA: Diagnosis not present

## 2021-04-10 DIAGNOSIS — I5032 Chronic diastolic (congestive) heart failure: Secondary | ICD-10-CM | POA: Diagnosis not present

## 2021-04-10 DIAGNOSIS — Z96651 Presence of right artificial knee joint: Secondary | ICD-10-CM | POA: Diagnosis not present

## 2021-04-10 LAB — CBC WITH DIFFERENTIAL/PLATELET
Abs Immature Granulocytes: 0.09 10*3/uL — ABNORMAL HIGH (ref 0.00–0.07)
Basophils Absolute: 0 10*3/uL (ref 0.0–0.1)
Basophils Relative: 0 %
Eosinophils Absolute: 0.1 10*3/uL (ref 0.0–0.5)
Eosinophils Relative: 1 %
HCT: 33.5 % — ABNORMAL LOW (ref 36.0–46.0)
Hemoglobin: 11.1 g/dL — ABNORMAL LOW (ref 12.0–15.0)
Immature Granulocytes: 1 %
Lymphocytes Relative: 9 %
Lymphs Abs: 0.8 10*3/uL (ref 0.7–4.0)
MCH: 29.8 pg (ref 26.0–34.0)
MCHC: 33.1 g/dL (ref 30.0–36.0)
MCV: 89.8 fL (ref 80.0–100.0)
Monocytes Absolute: 0.8 10*3/uL (ref 0.1–1.0)
Monocytes Relative: 9 %
Neutro Abs: 7.5 10*3/uL (ref 1.7–7.7)
Neutrophils Relative %: 80 %
Platelets: 452 10*3/uL — ABNORMAL HIGH (ref 150–400)
RBC: 3.73 MIL/uL — ABNORMAL LOW (ref 3.87–5.11)
RDW: 12.6 % (ref 11.5–15.5)
WBC: 9.3 10*3/uL (ref 4.0–10.5)
nRBC: 0 % (ref 0.0–0.2)

## 2021-04-10 LAB — COMPREHENSIVE METABOLIC PANEL
ALT: 14 U/L (ref 0–44)
AST: 18 U/L (ref 15–41)
Albumin: 2.7 g/dL — ABNORMAL LOW (ref 3.5–5.0)
Alkaline Phosphatase: 76 U/L (ref 38–126)
Anion gap: 13 (ref 5–15)
BUN: 13 mg/dL (ref 8–23)
CO2: 24 mmol/L (ref 22–32)
Calcium: 7.9 mg/dL — ABNORMAL LOW (ref 8.9–10.3)
Chloride: 91 mmol/L — ABNORMAL LOW (ref 98–111)
Creatinine, Ser: 1.02 mg/dL — ABNORMAL HIGH (ref 0.44–1.00)
GFR, Estimated: 54 mL/min — ABNORMAL LOW (ref >60)
Glucose, Bld: 113 mg/dL — ABNORMAL HIGH (ref 70–99)
Potassium: 3.5 mmol/L (ref 3.5–5.1)
Sodium: 128 mmol/L — ABNORMAL LOW (ref 135–145)
Total Bilirubin: 0.8 mg/dL (ref 0.3–1.2)
Total Protein: 6 g/dL — ABNORMAL LOW (ref 6.5–8.1)

## 2021-04-10 LAB — LIPASE, BLOOD: Lipase: 22 U/L (ref 11–51)

## 2021-04-10 MED ORDER — ONDANSETRON HCL 4 MG/2ML IJ SOLN
4.0000 mg | Freq: Once | INTRAMUSCULAR | Status: AC
  Administered 2021-04-11: 4 mg via INTRAVENOUS
  Filled 2021-04-10: qty 2

## 2021-04-10 MED ORDER — SODIUM CHLORIDE 0.9 % IV BOLUS
1000.0000 mL | Freq: Once | INTRAVENOUS | Status: AC
  Administered 2021-04-11: 1000 mL via INTRAVENOUS

## 2021-04-10 MED ORDER — IOHEXOL 300 MG/ML  SOLN
100.0000 mL | Freq: Once | INTRAMUSCULAR | Status: AC | PRN
  Administered 2021-04-10: 100 mL via INTRAVENOUS

## 2021-04-10 MED ORDER — MORPHINE SULFATE (PF) 4 MG/ML IV SOLN
4.0000 mg | Freq: Once | INTRAVENOUS | Status: AC
  Administered 2021-04-11: 4 mg via INTRAVENOUS
  Filled 2021-04-10: qty 1

## 2021-04-10 NOTE — ED Provider Notes (Signed)
[facility] EMERGENCY DEPARTMENT Provider Note   CSN: [id number] Arrival date & time: 04/10/21  2040     History Chief Complaint  Patient presents with   Constipation   Vomiting    [name] is a 87 y.o. female.  Patient is an 87 year old female with past medical history of chronic renal insufficiency, breast cancer, asthma, and recent right total hip replacement.  Since returning home from her hip surgery 1 week ago, patient has been having abdominal discomfort, nausea, and constipation.  She reports not having a bowel movement in several days and reports multiple episodes of vomiting.  She reports very little p.o. intake during this period of time.  She denies any fevers or chills.  She denies any bloody stool or vomit.  The history is provided by the patient.  Constipation Severity:  Moderate Time since last bowel movement:  1 week Timing:  Constant Progression:  Worsening Chronicity:  New Relieved by:  Nothing Worsened by:  Nothing     Past Medical History:  Diagnosis Date   Anemia    after hysterectomy   Angio-edema    Asthma, severe persistent    pulmologist-- dr Lynford Citizen   Cancer [facility])    Breast cancer on right   Chronic kidney disease, stage II (mild)    DDD (degenerative disc disease), cervical    Diverticulitis    Diverticulosis yrs ago   hx of    Edema, lower extremity    occ both legs swell   GERD (gastroesophageal reflux disease)    Headache    sinus headaches   Heart murmur    no problems - per pt   History of breast cancer 1990 left mastectomy also   1989  S/P   RIGHT MASTECTOMY ;  NO CHEMORADIATION //   NO RECURRENCE   History of DVT of lower extremity 5 yrs ago   right leg   History of shingles    Hyperlipidemia    Internal hemorrhoid    Leg ulcer, left (HCC)    Osteoporosis, unspecified    Knee and hip osteoarthritis bilaterally   Perennial allergic rhinitis    Peripheral vascular disease (HCC)    Pneumonia     Restless legs syndrome (RLS)    Rheumatoid arthritis ([place])    hands   Thyroid nodule    followed by dr perrini yearly, no current problam   Unspecified essential hypertension    Urticaria     Patient Active Problem List   Diagnosis Date Noted   Femur fracture, right ([place]) 03/31/2021   Unstable angina ([place]) 06/15/2020   Adverse effect of other viral vaccines, subsequent encounter 04/17/2020   Chest pain 12/22/2018   Displaced fracture of greater tuberosity of right humerus, initial encounter for closed fracture 09/21/2018   Closed fracture of right proximal humerus 09/21/2018   S/P left TH revision 12/03/2017   Essential hypertension 11/17/2017   Chronic diastolic CHF (congestive heart failure) ([place]) 03/19/2017   CAD (coronary artery disease), native coronary artery 12/20/2016   Closed left hip fracture ([place]) 12/19/2016   CKD (chronic kidney disease) stage 3, GFR 30-59 ml/min ([place]) 12/19/2016   History of DVT of lower extremity    Lung nodule 08/03/2014   Peripheral vascular disease, unspecified ([place]) 09/12/2013   Hyperlipidemia 03/03/2013   Expected blood loss anemia 10/06/2012   Overweight (BMI 25.0-29.9) 10/06/2012   DJD (degenerative joint disease) 05/26/2012   Severe persistent asthma 08/05/2007   Hypertensive heart disease without CHF 04/30/2007  Allergic rhinitis 01/13/2007   GERD 01/13/2007    Past Surgical History:  Procedure Laterality Date   CARDIAC CATHETERIZATION  08/ 01/ 2008   dr Cathie Olden   normal -- EF of 65%   CARDIOVASCULAR STRESS TEST  05-22-2011   dr Cathie Olden   normal lexiscan perfusion study/  no ischemia/  lvsf  86%   CATARACT EXTRACTION W/ INTRAOCULAR LENS  IMPLANT, BILATERAL     CHOLECYSTECTOMY  1993   laparoscopic   CONVERSION TO TOTAL HIP Left 12/03/2017   Procedure: CONVERSION TO LEFT TOTAL HIP;  Surgeon: Paralee Cancel, MD;  Location: [facility];  Service: Orthopedics;  Laterality: Left;  90 mins   HIP ARTHROPLASTY Left 12/22/2016   Procedure:  HEMIARTHROPLASTY, LEFT;  Surgeon: Paralee Cancel, MD;  Location: [facility];  Service: Orthopedics;  Laterality: Left;   INCISION AND DRAINAGE OF WOUND Left 10/24/2013   Procedure: IRRIGATION AND DEBRIDEMENT OF LEFT LEG WITH PLACEMENT OF ACELL AND WOUND [place];  Surgeon: Theodoro Kos, DO;  Location: [place];  Service: Plastics;  Laterality: Left;   KNEE ARTHROPLASTY     LUMBAR [place] Bilateral rigth 1989///   left  1990   breast cancer 1989//   fibrocytic disease 1990   ORIF HUMERUS FRACTURE Right 09/21/2018   Procedure: OPEN REDUCTION INTERNAL FIXATION (ORIF) PROXIMAL HUMERUS FRACTURE;  Surgeon: Nicholes Stairs, MD;  Location: [facility];  Service: Orthopedics;  Laterality: Right;  90 mins   TOTAL ABDOMINAL HYSTERECTOMY W/ BILATERAL SALPINGOOPHORECTOMY  1989   done at same time as right mastectomy   TOTAL HIP ARTHROPLASTY Right 04/02/2021   Procedure: TOTAL HIP ARTHROPLASTY ANTERIOR APPROACH;  Surgeon: Paralee Cancel, MD;  Location: [facility];  Service: Orthopedics;  Laterality: Right;   TOTAL KNEE ARTHROPLASTY Right 10/05/2012   Procedure: RIGHT TOTAL KNEE ARTHROPLASTY;  Surgeon: Mauri Pole, MD;  Location: [facility];  Service: Orthopedics;  Laterality: Right;     OB History   No obstetric history on file.     Family History  Problem Relation Age of Onset   Heart attack Mother    Asthma Mother    Heart disease Mother    Hyperlipidemia Mother    Allergic rhinitis Mother    Heart attack Father    Heart failure Father    Deep vein thrombosis Father    Heart disease Father    Peripheral vascular disease Father        amputation   Asthma Brother    Allergic rhinitis Brother    Asthma Sister    Eczema Sister    Allergic rhinitis Sister    Cancer Brother    Asthma Brother    Coronary artery disease Sister    Heart disease Sister    Asthma Maternal Grandmother    Urticaria Neg Hx     Social History   Tobacco Use   Smoking status:  Never   Smokeless tobacco: Never  Vaping Use   Vaping Use: Never used  Substance Use Topics   Alcohol use: No   Drug use: No    Home Medications Prior to Admission medications   Medication Sig Start Date End Date Taking? Authorizing Provider  albuterol (PROAIR HFA) 108 (90 Base) MCG/ACT inhaler Inhale 2 puffs into the lungs every 6 (six) hours as needed for wheezing or shortness of breath. 12/26/20   Brand Males, MD  benzonatate (TESSALON) 100 MG capsule Take 1 capsule (100 mg total) by  mouth 3 (three) times daily as needed for cough. 12/02/17   Brand Males, MD  Calcium Carbonate-Vitamin D 600-400 MG-UNIT tablet Take 1 tablet by mouth daily.    [provider]  chlorpheniramine (CHLOR-TRIMETON) 4 MG tablet Take 4-8 mg by mouth See admin instructions. 4 mg in the morning and 8 mg at bedtime 01/12/13   Elsie Stain, MD  Cholecalciferol (VITAMIN D3) 1000 UNITS tablet Take 1,000 Units by mouth every evening.     [provider]  dextromethorphan (DELSYM) 30 MG/5ML liquid Take 60 mg by mouth 2 (two) times daily as needed for cough.    [provider]  docusate sodium (COLACE) 100 MG capsule Take 1 capsule (100 mg total) by mouth 2 (two) times daily. 04/03/21   Irving Copas, PA-C  fexofenadine (ALLEGRA) 180 MG tablet Take 180 mg by mouth daily.   12/02/10   Elsie Stain, MD  furosemide (LASIX) 20 MG tablet TAKE 1 TABLET (20 MG TOTAL) BY MOUTH DAILY AS NEEDED FOR FLUID OR EDEMA. Patient taking differently: Take 20 mg by mouth daily as needed for fluid. 09/25/20   Nahser, Wonda Cheng, MD  HYDROcodone-acetaminophen (NORCO/VICODIN) 5-325 MG tablet Take 1-2 tablets by mouth every 6 (six) hours as needed for severe pain. 04/03/21   Irving Copas, PA-C  ipratropium (ATROVENT) 0.06 % nasal spray Place 2 sprays into both nostrils 2 (two) times daily. 01/24/19   [provider]  methocarbamol (ROBAXIN) 500 MG tablet Take 1 tablet (500 mg total) by  mouth every 6 (six) hours as needed for muscle spasms. 04/03/21   Irving Copas, PA-C  mometasone (NASONEX) 50 MCG/ACT nasal spray USE 2 SPRAYS NASALLY 2 TIMES DAILY Patient taking differently: 2 sprays daily. 12/16/19   Brand Males, MD  montelukast (SINGULAIR) 10 MG tablet TAKE 1 TABLET BY MOUTH EVERY NIGHT AT BEDTIME 10/16/20   Brand Males, MD  Multiple Vitamin (MULTIVITAMIN WITH MINERALS) TABS tablet Take 1 tablet by mouth daily. Centrum Silver Multivitamin    [provider]  multivitamin-lutein (OCUVITE-LUTEIN) CAPS capsule Take 1 capsule by mouth daily.    [provider]  mupirocin ointment (BACTROBAN) 2 % as needed.     [provider]  NEXIUM 40 MG capsule TAKE 1 CAPSULE BY MOUTH TWICE A DAY 10/15/20   Brand Males, MD  nitroGLYCERIN (NITROSTAT) 0.4 MG SL tablet Place 1 tablet (0.4 mg total) under the tongue every 5 (five) minutes as needed for chest pain. 06/15/20   Nahser, Wonda Cheng, MD  polyethylene glycol (MIRALAX / GLYCOLAX) 17 g packet Take 17 g by mouth daily as needed for mild constipation. 04/03/21   Irving Copas, PA-C  potassium chloride (KLOR-CON) 10 MEQ tablet TAKE 1 TABLET BY MOUTH EVERY DAY 10/15/20   Nahser, Wonda Cheng, MD  pramipexole (MIRAPEX) 0.125 MG tablet Take 0.375 mg by mouth at bedtime.    [provider]  rivaroxaban (XARELTO) 10 MG TABS tablet Take 10 mg by mouth daily.    [provider]  rosuvastatin (CRESTOR) 5 MG tablet TAKE 1 TABLET BY MOUTH EVERYDAY AT BEDTIME Patient taking differently: Take 5 mg by mouth daily. 10/10/20   Nahser, Wonda Cheng, MD  Spacer/Aero-Holding Chambers (AEROCHAMBER MV) inhaler by Other route. Use as instructed     [provider]  telmisartan (MICARDIS) 20 MG tablet Take 20 mg by mouth daily.     [provider]  vitamin B-12 (CYANOCOBALAMIN) 500 MCG tablet Take 500 mcg by mouth daily.  [provider]    Allergies    Codeine, Egg phospholipids,  Eggs or egg-derived products, Hydrocodone, Influenza vaccines, Nucala [mepolizumab], Cephalosporins, Gabapentin, Levofloxacin, and Pneumococcal vaccines  Review of Systems   Review of Systems  Gastrointestinal:  Positive for constipation.  All other systems reviewed and are negative.  Physical Exam Updated Vital Signs BP 138/63   Pulse 85   Temp 97.9 F (36.6 C) (Oral)   Resp 20   SpO2 95%   Physical Exam Vitals and nursing note reviewed.  Constitutional:      General: She is not in acute distress.    Appearance: She is well-developed. She is not diaphoretic.  HENT:     Head: Normocephalic and atraumatic.  Cardiovascular:     Rate and Rhythm: Normal rate and regular rhythm.     Heart sounds: No murmur heard.   No friction rub. No gallop.  Pulmonary:     Effort: Pulmonary effort is normal. No respiratory distress.     Breath sounds: Normal breath sounds. No wheezing.  Abdominal:     General: Bowel sounds are normal. There is no distension.     Palpations: Abdomen is soft.     Tenderness: There is abdominal tenderness. There is no right CVA tenderness, left CVA tenderness, guarding or rebound.     Comments: There is generalized abdominal tenderness, but no rebound or guarding.  Musculoskeletal:        General: Normal range of motion.     Cervical back: Normal range of motion and neck supple.  Skin:    General: Skin is warm and dry.  Neurological:     General: No focal deficit present.     Mental Status: She is alert and oriented to person, place, and time.    ED Results / Procedures / Treatments   Labs (all labs ordered are listed, but only abnormal results are displayed) Labs Reviewed  LIPASE, BLOOD  COMPREHENSIVE METABOLIC PANEL  CBC WITH DIFFERENTIAL/PLATELET  URINALYSIS, ROUTINE W REFLEX MICROSCOPIC    EKG None  Radiology No results found.  Procedures Procedures   Medications Ordered in ED Medications  sodium chloride 0.9 % bolus 1,000 mL (has no  administration in time range)  morphine 4 MG/ML injection 4 mg (has no administration in time range)  ondansetron (ZOFRAN) injection 4 mg (has no administration in time range)    ED Course  I have reviewed the triage vital signs and the nursing notes.  Pertinent labs & imaging results that were available during my care of the patient were reviewed by me and considered in my medical decision making (see chart for details).    MDM Rules/Calculators/A&P  Patient presenting here with complaints of nausea and abdominal pain as described in the HPI.  She recently underwent hip surgery and symptoms have worsened since.  She arrives here with stable vital signs and is afebrile.  Work-up initiated including laboratory studies, urinalysis, and CT scan of the abdomen and pelvis.  Laboratory studies are all reassuring.  Urinalysis is clear.  CT scan is what appears to be gastritis and mild thickening of the proximal portion of the transverse colon, but no evidence for bowel obstruction, significant constipation, or fecal impaction.  Patient was given IV fluids along with Zofran and morphine.  She is now feeling significantly improved.  I see nothing in the work-up that necessitates admission.  Patient to be discharged with Carafate and Zofran.  She has tramadol at home which she can take for pain.  She also has Nexium at home, however it sounds as though she has been intermittently compliant with taking this.  Patient to be referred to GI for discussion of endoscopy as recommended by the radiologist.  Final Clinical Impression(s) / ED Diagnoses Final diagnoses:  None    Rx / DC Orders ED Discharge Orders     None        Veryl Speak, MD 04/11/21 0147

## 2021-04-10 NOTE — ED Triage Notes (Signed)
Pt c/o constipation since 11/23 the day after hip surgery-c/o n/v x 3 days-came home from the hospital 11/28-pt to triage in w/c-NAD-niece with pt

## 2021-04-11 DIAGNOSIS — R1110 Vomiting, unspecified: Secondary | ICD-10-CM | POA: Diagnosis not present

## 2021-04-11 DIAGNOSIS — R109 Unspecified abdominal pain: Secondary | ICD-10-CM | POA: Diagnosis not present

## 2021-04-11 LAB — URINALYSIS, ROUTINE W REFLEX MICROSCOPIC
Bilirubin Urine: NEGATIVE
Glucose, UA: NEGATIVE mg/dL
Hgb urine dipstick: NEGATIVE
Ketones, ur: 15 mg/dL — AB
Leukocytes,Ua: NEGATIVE
Nitrite: NEGATIVE
Protein, ur: NEGATIVE mg/dL
Specific Gravity, Urine: 1.01 (ref 1.005–1.030)
pH: 6.5 (ref 5.0–8.0)

## 2021-04-11 MED ORDER — ONDANSETRON 8 MG PO TBDP: ORAL_TABLET | ORAL | 0 refills | Status: DC

## 2021-04-11 MED ORDER — PANTOPRAZOLE SODIUM 40 MG IV SOLR
40.0000 mg | Freq: Once | INTRAVENOUS | Status: AC
  Administered 2021-04-11: 40 mg via INTRAVENOUS
  Filled 2021-04-11: qty 40

## 2021-04-11 MED ORDER — SUCRALFATE 1 G PO TABS: 1.0000 g | ORAL_TABLET | Freq: Three times a day (TID) | ORAL | 0 refills | Status: DC

## 2021-04-11 NOTE — Discharge Instructions (Signed)
Resume your Nexium as previously prescribed.  Begin taking Carafate as prescribed today.  Begin taking Zofran as prescribed as needed for nausea.  Follow-up with your primary doctor in the next week, and return to the ER if symptoms significantly worsen or change.  A referral has also been placed to [facility] gastroenterology to discuss endoscopy.  Their contact information has been provided in this discharge summary for you to call and make these arrangements.

## 2021-04-14 ENCOUNTER — Other Ambulatory Visit: Payer: Self-pay | Admitting: Internal Medicine

## 2021-04-16 DIAGNOSIS — K5900 Constipation, unspecified: Secondary | ICD-10-CM | POA: Diagnosis not present

## 2021-04-16 DIAGNOSIS — K3189 Other diseases of stomach and duodenum: Secondary | ICD-10-CM | POA: Diagnosis not present

## 2021-04-16 DIAGNOSIS — R1084 Generalized abdominal pain: Secondary | ICD-10-CM | POA: Diagnosis not present

## 2021-04-16 DIAGNOSIS — R110 Nausea: Secondary | ICD-10-CM | POA: Diagnosis not present

## 2021-04-16 DIAGNOSIS — K529 Noninfective gastroenteritis and colitis, unspecified: Secondary | ICD-10-CM | POA: Diagnosis not present

## 2021-04-24 ENCOUNTER — Ambulatory Visit: Payer: Medicare Other | Admitting: Cardiovascular Disease

## 2021-04-24 ENCOUNTER — Ambulatory Visit: Payer: Medicare Other

## 2021-04-25 DIAGNOSIS — R1084 Generalized abdominal pain: Secondary | ICD-10-CM | POA: Diagnosis not present

## 2021-04-25 DIAGNOSIS — I10 Essential (primary) hypertension: Secondary | ICD-10-CM | POA: Diagnosis not present

## 2021-04-25 DIAGNOSIS — R110 Nausea: Secondary | ICD-10-CM | POA: Diagnosis not present

## 2021-04-25 DIAGNOSIS — R531 Weakness: Secondary | ICD-10-CM | POA: Diagnosis not present

## 2021-04-25 DIAGNOSIS — K3189 Other diseases of stomach and duodenum: Secondary | ICD-10-CM | POA: Diagnosis not present

## 2021-04-25 DIAGNOSIS — R058 Other specified cough: Secondary | ICD-10-CM | POA: Diagnosis not present

## 2021-05-08 ENCOUNTER — Ambulatory Visit: Payer: Medicare Other

## 2021-05-22 ENCOUNTER — Ambulatory Visit: Payer: Medicare Other

## 2021-06-05 ENCOUNTER — Ambulatory Visit: Payer: Medicare Other

## 2021-06-13 ENCOUNTER — Ambulatory Visit: Payer: Medicare Other | Admitting: Internal Medicine

## 2021-06-13 ENCOUNTER — Encounter: Payer: Self-pay | Admitting: Internal Medicine

## 2021-06-13 VITALS — BP 110/70 | HR 90 | Ht 65.0 in | Wt 128.4 lb

## 2021-06-13 DIAGNOSIS — R935 Abnormal findings on diagnostic imaging of other abdominal regions, including retroperitoneum: Secondary | ICD-10-CM

## 2021-06-13 DIAGNOSIS — R109 Unspecified abdominal pain: Secondary | ICD-10-CM

## 2021-06-13 DIAGNOSIS — R634 Abnormal weight loss: Secondary | ICD-10-CM | POA: Diagnosis not present

## 2021-06-13 NOTE — Progress Notes (Signed)
HISTORY OF PRESENT ILLNESS:  [name] is a 88 y.o. female with multiple medical problems including a remote history of breast cancer, angioedema, remote DVT on Xarelto, and asthma.  She is sent today by her primary care provider's office regarding abdominal pain, weight loss, and abnormal CT scan.  The patient tells me that after having had hip surgery she developed some issues with abdominal discomfort, nausea, constipation.  She was evaluated in the emergency room April 10, 2021.  I have reviewed that encounter.  As part of her evaluation she underwent blood work.  Abnormal labs included sodium 128, albumin 2.7, and hemoglobin 11.1.  She did undergo a contrast-enhanced CT scan of the abdomen and pelvis.  The most significant finding was asymmetric marked severity gastric wall thickening of uncertain etiology.  Endoscopy recommended.  Also noted to have mild thickening of the proximal portion of the transverse colon which was felt to represent its decompressed nature.  Other findings were incidental.  She was told she had gastritis and treated with twice daily PPI and sucralfate.  She did undergo a colonoscopy in July 2010 to evaluate rectal bleeding.  She was found to have diverticulosis, internal hemorrhoids, and the diminutive colon polyp.  Patient tells me that in early December she developed COVID.  Since that time she reports decreased appetite and 15 pound weight loss.  She states that food sometimes causes increased abdominal discomfort, but at other times provides relief.  She tells me that she was placed on Xarelto years ago for DVT.  Not recurrent.  No pulmonary embolus.  No history of stroke.  Her weight in August 2022 was 143 pounds.  Her weight today is 128 pounds.  REVIEW OF SYSTEMS:  All non-GI ROS negative unless otherwise stated in the HPI. Past Medical History:  Diagnosis Date   Anemia    after hysterectomy   Angio-edema    Asthma, severe persistent    pulmologist-- dr  Lynford Citizen   Cancer [facility])    Breast cancer on right   Chronic kidney disease, stage II (mild)    DDD (degenerative disc disease), cervical    Diverticulitis    Diverticulosis yrs ago   hx of    Edema, lower extremity    occ both legs swell   GERD (gastroesophageal reflux disease)    Headache    sinus headaches   Heart murmur    no problems - per pt   History of breast cancer 1990 left mastectomy also   1989  S/P   RIGHT MASTECTOMY ;  NO CHEMORADIATION //   NO RECURRENCE   History of DVT of lower extremity 5 yrs ago   right leg   History of shingles    Hyperlipidemia    Internal hemorrhoid    Leg ulcer, left (HCC)    Osteoporosis, unspecified    Knee and hip osteoarthritis bilaterally   Perennial allergic rhinitis    Peripheral vascular disease (HCC)    Pneumonia    Restless legs syndrome (RLS)    Rheumatoid arthritis ([place])    hands   Thyroid nodule    followed by dr perrini yearly, no current problam   Unspecified essential hypertension    Urticaria     Past Surgical History:  Procedure Laterality Date   CARDIAC CATHETERIZATION  08/ 01/ 2008   dr Cathie Olden   normal -- EF of 65%   CARDIOVASCULAR STRESS TEST  05-22-2011   dr Cathie Olden   normal lexiscan perfusion study/  no  ischemia/  lvsf  86%   CATARACT EXTRACTION W/ INTRAOCULAR LENS  IMPLANT, BILATERAL     CHOLECYSTECTOMY  1993   laparoscopic   CONVERSION TO TOTAL HIP Left 12/03/2017   Procedure: CONVERSION TO LEFT TOTAL HIP;  Surgeon: Paralee Cancel, MD;  Location: [facility];  Service: Orthopedics;  Laterality: Left;  90 mins   HIP ARTHROPLASTY Left 12/22/2016   Procedure: HEMIARTHROPLASTY, LEFT;  Surgeon: Paralee Cancel, MD;  Location: [facility];  Service: Orthopedics;  Laterality: Left;   INCISION AND DRAINAGE OF WOUND Left 10/24/2013   Procedure: IRRIGATION AND DEBRIDEMENT OF LEFT LEG WITH PLACEMENT OF ACELL AND WOUND [place];  Surgeon: Theodoro Kos, DO;  Location: [facility];  Service: Plastics;  Laterality: Left;    KNEE ARTHROPLASTY     LUMBAR [place] Bilateral rigth 1989///   left  1990   breast cancer 1989//   fibrocytic disease 1990   ORIF HUMERUS FRACTURE Right 09/21/2018   Procedure: OPEN REDUCTION INTERNAL FIXATION (ORIF) PROXIMAL HUMERUS FRACTURE;  Surgeon: Nicholes Stairs, MD;  Location: [place];  Service: Orthopedics;  Laterality: Right;  90 mins   TOTAL ABDOMINAL HYSTERECTOMY W/ BILATERAL SALPINGOOPHORECTOMY  1989   done at same time as right mastectomy   TOTAL HIP ARTHROPLASTY Right 04/02/2021   Procedure: TOTAL HIP ARTHROPLASTY ANTERIOR APPROACH;  Surgeon: Paralee Cancel, MD;  [facility];  Service: Orthopedics;  Laterality: Right;   TOTAL KNEE ARTHROPLASTY Right 10/05/2012   Procedure: RIGHT TOTAL KNEE ARTHROPLASTY;  Surgeon: Mauri Pole, MD;  [facility];  Service: Orthopedics;  Laterality: Right;    Social History Bailei Enrique  reports that she has never smoked. She has never used smokeless tobacco. She reports that she does not drink alcohol and does not use drugs.  family history includes Allergic rhinitis in her brother, mother, and sister; Asthma in her brother, brother, maternal grandmother, mother, and sister; Cancer in her brother; Coronary artery disease in her sister; Deep vein thrombosis in her father; Eczema in her sister; Heart attack in her father and mother; Heart disease in her father, mother, and sister; Heart failure in her father; Hyperlipidemia in her mother; Peripheral vascular disease in her father.  Allergies  Allergen Reactions   Codeine Nausea And Vomiting   Egg Phospholipids    Eggs Or Egg-Derived Products     UNSPECIFIED REACTION  RAW EGGS.. Can eat cooked eggs   Hydrocodone Nausea And Vomiting   Influenza Vaccines     UNSPECIFIED REACTION  Egg allergy    Nucala [Mepolizumab]     UNSPECIFIED REACTION    Cephalosporins Rash   Gabapentin Nausea Only   Levofloxacin Rash   Pneumococcal Vaccines Rash        PHYSICAL EXAMINATION: Vital signs: BP 110/70    Pulse 90    Ht 5\' 5"  (1.651 m)    Wt 128 lb 6.4 oz (58.2 kg)    BMI 21.37 kg/m   Constitutional: generally well-appearing, no acute distress Psychiatric: alert and oriented x3, cooperative Eyes: extraocular movements intact, anicteric, conjunctiva pink Mouth: oral pharynx moist, no lesions Neck: supple no lymphadenopathy Cardiovascular: heart regular rate and rhythm, no murmur Lungs: clear to auscultation bilaterally Abdomen: soft, nontender, nondistended, no obvious ascites, no peritoneal signs, normal bowel sounds, no organomegaly Rectal: Omitted Extremities: no clubbing, cyanosis, or lower extremity edema bilaterally Skin: no lesions on visible extremities Neuro: No focal deficits.  Cranial nerves  ASSESSMENT:  1.  Abdominal pain and weight loss.  Problems persist despite PPI.  Rule out ulcer.  Rule out gastric cancer. 2.  Abnormal CT scan of the abdomen.  Rule out gastric cancer 3.  History of DVT on chronic anticoagulation.  No history of recurrent DVT or PE. 4.  General medical problems   PLAN:  1.  Schedule upper endoscopy with probable biopsies.  The patient is HIGH RISK given her age, comorbidities, and the need to address chronic anticoagulation therapy. 2.  Hold Xarelto 2 days prior to the endoscopy.  We discussed the pros and cons of interrupting anticoagulation therapy.  She should be at low risk for short-term discontinuation of anticoagulation based on her history. 3.  Further recommendations to follow

## 2021-06-13 NOTE — Patient Instructions (Signed)
If you are age 87 or older, your body mass index should be between 23-30. Your Body mass index is 21.37 kg/m. If this is out of the aforementioned range listed, please consider follow up with your Primary Care Provider.  If you are age 54 or younger, your body mass index should be between 19-25. Your Body mass index is 21.37 kg/m. If this is out of the aformentioned range listed, please consider follow up with your Primary Care Provider.   ________________________________________________________  The [facility] providers would like to encourage you to use [facility] to communicate with providers for non-urgent requests or questions.  Due to long hold times on the telephone, sending your provider a message by [facility] may be a faster and more efficient way to get a response.  Please allow 48 business hours for a response.  Please remember that this is for non-urgent requests.  _______________________________________________________  [name] have been scheduled for an endoscopy. Please follow written instructions given to you at your visit today. If you use inhalers (even only as needed), please bring them with you on the day of your procedure.

## 2021-06-18 ENCOUNTER — Encounter: Payer: Self-pay | Admitting: Internal Medicine

## 2021-06-18 ENCOUNTER — Ambulatory Visit (AMBULATORY_SURGERY_CENTER): Payer: Medicare Other | Admitting: Internal Medicine

## 2021-06-18 VITALS — BP 177/75 | HR 84 | Temp 99.1°F | Resp 18 | Ht 65.0 in | Wt 128.0 lb

## 2021-06-18 DIAGNOSIS — R634 Abnormal weight loss: Secondary | ICD-10-CM

## 2021-06-18 DIAGNOSIS — R109 Unspecified abdominal pain: Secondary | ICD-10-CM | POA: Diagnosis not present

## 2021-06-18 DIAGNOSIS — K317 Polyp of stomach and duodenum: Secondary | ICD-10-CM

## 2021-06-18 DIAGNOSIS — R935 Abnormal findings on diagnostic imaging of other abdominal regions, including retroperitoneum: Secondary | ICD-10-CM

## 2021-06-18 MED ORDER — SODIUM CHLORIDE 0.9 % IV SOLN: 500.0000 mL | Freq: Once | INTRAVENOUS | Status: DC

## 2021-06-18 NOTE — Progress Notes (Signed)
To PACU, VSS. Report to [email or web address] 

## 2021-06-18 NOTE — Progress Notes (Signed)
Pt's states no medical or surgical changes since previsit or office visit. 

## 2021-06-18 NOTE — Patient Instructions (Signed)
RESUME Xarelto at prior dose today.  CONTINUE PANTOPRAZOLE DAILY  YOU HAD AN ENDOSCOPIC PROCEDURE TODAY AT THE [facility]:   Refer to the procedure report that was given to you for any specific questions about what was found during the examination.  If the procedure report does not answer your questions, please call your gastroenterologist to clarify.  If you requested that your care partner not be given the details of your procedure findings, then the procedure report has been included in a sealed envelope for you to review at your convenience later.  YOU SHOULD EXPECT: Some feelings of bloating in the abdomen. Passage of more gas than usual.  Walking can help get rid of the air that was put into your GI tract during the procedure and reduce the bloating. If you had a lower endoscopy (such as a colonoscopy or flexible sigmoidoscopy) you may notice spotting of blood in your stool or on the toilet paper. If you underwent a bowel prep for your procedure, you may not have a normal bowel movement for a few days.  Please Note:  You might notice some irritation and congestion in your nose or some drainage.  This is from the oxygen used during your procedure.  There is no need for concern and it should clear up in a day or so.  SYMPTOMS TO REPORT IMMEDIATELY:  Following upper endoscopy (EGD)  Vomiting of blood or coffee ground material  New chest pain or pain under the shoulder blades  Painful or persistently difficult swallowing  New shortness of breath  Fever of 100F or higher  Black, tarry-looking stools  For urgent or emergent issues, a gastroenterologist can be reached at any hour by calling [phone]. Do not use MyChart messaging for urgent concerns.    DIET:  We do recommend a small meal at first, but then you may proceed to your regular diet.  Drink plenty of fluids but you should avoid alcoholic beverages for 24 hours.  ACTIVITY:  You should plan to take it easy for  the rest of today and you should NOT DRIVE or use heavy machinery until tomorrow (because of the sedation medicines used during the test).    FOLLOW UP: Our staff will call the number listed on your records 48-72 hours following your procedure to check on you and address any questions or concerns that you may have regarding the information given to you following your procedure. If we do not reach you, we will leave a message.  We will attempt to reach you two times.  During this call, we will ask if you have developed any symptoms of COVID 19. If you develop any symptoms (ie: fever, flu-like symptoms, shortness of breath, cough etc.) before then, please call [phone].  If you test positive for Covid 19 in the 2 weeks post procedure, please call and report this information to [place].    If any biopsies were taken you will be contacted by phone or by letter within the next 1-3 weeks.  Please call us at [phone] if you have not heard about the biopsies in 3 weeks.    SIGNATURES/CONFIDENTIALITY: You and/or your care partner have signed paperwork which will be entered into your electronic medical record.  These signatures attest to the fact that that the information above on your After Visit Summary has been reviewed and is understood.  Full responsibility of the confidentiality of this discharge information lies with you and/or your care-partner.

## 2021-06-18 NOTE — Progress Notes (Signed)
HISTORY OF PRESENT ILLNESS:   [name] is a 88 y.o. female with multiple medical problems including a remote history of breast cancer, angioedema, remote DVT on Xarelto, and asthma.  She is sent today by her primary care provider's office regarding abdominal pain, weight loss, and abnormal CT scan.  The patient tells me that after having had hip surgery she developed some issues with abdominal discomfort, nausea, constipation.  She was evaluated in the emergency room April 10, 2021.  I have reviewed that encounter.  As part of her evaluation she underwent blood work.  Abnormal labs included sodium 128, albumin 2.7, and hemoglobin 11.1.  She did undergo a contrast-enhanced CT scan of the abdomen and pelvis.  The most significant finding was asymmetric marked severity gastric wall thickening of uncertain etiology.  Endoscopy recommended.  Also noted to have mild thickening of the proximal portion of the transverse colon which was felt to represent its decompressed nature.  Other findings were incidental.  She was told she had gastritis and treated with twice daily PPI and sucralfate.  She did undergo a colonoscopy in July 2010 to evaluate rectal bleeding.  She was found to have diverticulosis, internal hemorrhoids, and the diminutive colon polyp.  Patient tells me that in early December she developed COVID.  Since that time she reports decreased appetite and 15 pound weight loss.  She states that food sometimes causes increased abdominal discomfort, but at other times provides relief.  She tells me that she was placed on Xarelto years ago for DVT.  Not recurrent.  No pulmonary embolus.  No history of stroke.  Her weight in August 2022 was 143 pounds.  Her weight today is 128 pounds.   REVIEW OF SYSTEMS:   All non-GI ROS negative unless otherwise stated in the HPI.     Past Medical History:  Diagnosis Date   Anemia      after hysterectomy   Angio-edema     Asthma, severe persistent       pulmologist-- dr Lynford Citizen   Cancer [facility])      Breast cancer on right   Chronic kidney disease, stage II (mild)     DDD (degenerative disc disease), cervical     Diverticulitis     Diverticulosis yrs ago    hx of    Edema, lower extremity      occ both legs swell   GERD (gastroesophageal reflux disease)     Headache      sinus headaches   Heart murmur      no problems - per pt   History of breast cancer 1990 left mastectomy also    1989  S/P   RIGHT MASTECTOMY ;  NO CHEMORADIATION //   NO RECURRENCE   History of DVT of lower extremity 5 yrs ago    right leg   History of shingles     Hyperlipidemia     Internal hemorrhoid     Leg ulcer, left (HCC)     Osteoporosis, unspecified      Knee and hip osteoarthritis bilaterally   Perennial allergic rhinitis     Peripheral vascular disease (HCC)     Pneumonia     Restless legs syndrome (RLS)     Rheumatoid arthritis ([place])      hands   Thyroid nodule      followed by dr perrini yearly, no current problam   Unspecified essential hypertension     Urticaria  Past Surgical History:  Procedure Laterality Date   CARDIAC CATHETERIZATION   08/ 01/ 2008   dr Cathie Olden    normal -- EF of 65%   CARDIOVASCULAR STRESS TEST   05-22-2011   dr Cathie Olden    normal lexiscan perfusion study/  no ischemia/  lvsf  86%   CATARACT EXTRACTION W/ INTRAOCULAR LENS  IMPLANT, BILATERAL       CHOLECYSTECTOMY   1993    laparoscopic   CONVERSION TO TOTAL HIP Left 12/03/2017    Procedure: CONVERSION TO LEFT TOTAL HIP;  Surgeon: Paralee Cancel, MD;  [facility];  Service: Orthopedics;  Laterality: Left;  90 mins   HIP ARTHROPLASTY Left 12/22/2016    Procedure: HEMIARTHROPLASTY, LEFT;  Surgeon: Paralee Cancel, MD;  [facility];  Service: Orthopedics;  Laterality: Left;   INCISION AND DRAINAGE OF WOUND Left 10/24/2013    Procedure: IRRIGATION AND DEBRIDEMENT OF LEFT LEG WITH PLACEMENT OF ACELL AND WOUND [place];  Surgeon: Theodoro Kos, DO;  Location:  [place];  Service: Plastics;  Laterality: Left;   KNEE ARTHROPLASTY       LUMBAR [place] Bilateral rigth 1989///   left  1990    breast cancer 1989//   fibrocytic disease 1990   ORIF HUMERUS FRACTURE Right 09/21/2018    Procedure: OPEN REDUCTION INTERNAL FIXATION (ORIF) PROXIMAL HUMERUS FRACTURE;  Surgeon: Nicholes Stairs, MD;  Location: [place];  Service: Orthopedics;  Laterality: Right;  90 mins   TOTAL ABDOMINAL HYSTERECTOMY W/ BILATERAL SALPINGOOPHORECTOMY   1989    done at same time as right mastectomy   TOTAL HIP ARTHROPLASTY Right 04/02/2021    Procedure: TOTAL HIP ARTHROPLASTY ANTERIOR APPROACH;  Surgeon: Paralee Cancel, MD;  [facility];  Service: Orthopedics;  Laterality: Right;   TOTAL KNEE ARTHROPLASTY Right 10/05/2012    Procedure: RIGHT TOTAL KNEE ARTHROPLASTY;  Surgeon: Mauri Pole, MD;  [facility];  Service: Orthopedics;  Laterality: Right;      Social History [name]  reports that she has never smoked. She has never used smokeless tobacco. She reports that she does not drink alcohol and does not use drugs.   family history includes Allergic rhinitis in her brother, mother, and sister; Asthma in her brother, brother, maternal grandmother, mother, and sister; Cancer in her brother; Coronary artery disease in her sister; Deep vein thrombosis in her father; Eczema in her sister; Heart attack in her father and mother; Heart disease in her father, mother, and sister; Heart failure in her father; Hyperlipidemia in her mother; Peripheral vascular disease in her father.        Allergies  Allergen Reactions   Codeine Nausea And Vomiting   Egg Phospholipids     Eggs Or Egg-Derived Products        UNSPECIFIED REACTION  RAW EGGS.. Can eat cooked eggs   Hydrocodone Nausea And Vomiting   Influenza Vaccines        UNSPECIFIED REACTION  Egg allergy     Nucala [Mepolizumab]        UNSPECIFIED REACTION     Cephalosporins Rash   Gabapentin Nausea Only   Levofloxacin Rash   Pneumococcal Vaccines Rash          PHYSICAL EXAMINATION: Vital signs: BP 110/70    Pulse 90    Ht 5\' 5"  (1.651 m)    Wt 128 lb 6.4 oz (58.2 kg)    BMI 21.37  kg/m   Constitutional: generally well-appearing, no acute distress Psychiatric: alert and oriented x3, cooperative Eyes: extraocular movements intact, anicteric, conjunctiva pink Mouth: oral pharynx moist, no lesions Neck: supple no lymphadenopathy Cardiovascular: heart regular rate and rhythm, no murmur Lungs: clear to auscultation bilaterally Abdomen: soft, nontender, nondistended, no obvious ascites, no peritoneal signs, normal bowel sounds, no organomegaly Rectal: Omitted Extremities: no clubbing, cyanosis, or lower extremity edema bilaterally Skin: no lesions on visible extremities Neuro: No focal deficits.  Cranial nerves   ASSESSMENT:   1.  Abdominal pain and weight loss.  Problems persist despite PPI.  Rule out ulcer.  Rule out gastric cancer. 2.  Abnormal CT scan of the abdomen.  Rule out gastric cancer 3.  History of DVT on chronic anticoagulation.  No history of recurrent DVT or PE. 4.  General medical problems     PLAN:   1.  Schedule upper endoscopy with probable biopsies.  The patient is HIGH RISK given her age, comorbidities, and the need to address chronic anticoagulation therapy. 2.  Hold Xarelto 2 days prior to the endoscopy.  We discussed the pros and cons of interrupting anticoagulation therapy.  She should be at low risk for short-term discontinuation of anticoagulation based on her history. 3.  Further recommendations to follow

## 2021-06-18 NOTE — Op Note (Signed)
[facility] Patient Name: [name] Procedure Date: 06/18/2021 11:34 AM MRN: [id number] Endoscopist: Docia Chuck. Henrene Pastor , MD Age: 88 Referring MD:  Date of Birth: 1933/05/11 Gender: Female Account #: [id number] Procedure:                Upper GI endoscopy Indications:              Abdominal pain, Anorexia, Weight loss Medicines:                Monitored Anesthesia Care Procedure:                Pre-Anesthesia Assessment:                           - Prior to the procedure, a History and Physical                            was performed, and patient medications and                            allergies were reviewed. The patient's tolerance of                            previous anesthesia was also reviewed. The risks                            and benefits of the procedure and the sedation                            options and risks were discussed with the patient.                            All questions were answered, and informed consent                            was obtained. Prior Anticoagulants: The patient has                            taken Xarelto (rivaroxaban), last dose was 3 days                            prior to procedure. ASA Grade Assessment: III - A                            patient with severe systemic disease. After                            reviewing the risks and benefits, the patient was                            deemed in satisfactory condition to undergo the                            procedure.  After obtaining informed consent, the endoscope was                            passed under direct vision. Throughout the                            procedure, the patient's blood pressure, pulse, and                            oxygen saturations were monitored continuously. The                            GIF [id number] #[id number] was introduced through the                            mouth, and advanced to the second part of duodenum.                             The upper GI endoscopy was accomplished without                            difficulty. The patient tolerated the procedure                            well. Scope In: Scope Out: Findings:                 The esophagus was normal.                           The stomach revealed benign fundic gland polyps.                            There was scarring of the posterior body consistent                            with prior ulcer. No mucosal lesions or other                            abnormalities.                           The examined duodenum was normal.                           The cardia and gastric fundus were normal on                            retroflexion, save hiatal hernia. Complications:            No immediate complications. Estimated Blood Loss:     Estimated blood loss: none. Impression:               1. Scarring gastric body consistent with prior  ulcer. No active mucosal disease                           2. Incidental benign fundic gland polyps                           3. Otherwise normal EGD. Recommendation:           - Patient has a contact number available for                            emergencies. The signs and symptoms of potential                            delayed complications were discussed with the                            patient. Return to normal activities tomorrow.                            Written discharge instructions were provided to the                            patient.                           - Resume previous diet.                           - Continue present medications.                           - Resume Xarelto (rivaroxaban) at prior dose today.                           - CONTINUE PANTOPRAZOLE DAILY                           - Daily nutritional supplements with items such as                            Ensure                           - Follow-up office appointment with Dr. Henrene Pastor in                             about 6 weeks Docia Chuck. Henrene Pastor, MD 06/18/2021 11:56:30 AM This report has been signed electronically.

## 2021-06-19 ENCOUNTER — Ambulatory Visit: Payer: Medicare Other

## 2021-06-20 ENCOUNTER — Telehealth: Payer: Self-pay

## 2021-06-20 NOTE — Telephone Encounter (Signed)
°  Follow up Call-  Call back number 06/18/2021  Post procedure Call Back phone  # [phone]  Permission to leave phone message Yes  Some recent data might be hidden     Patient questions:  Do you have a fever, pain , or abdominal swelling? No. Pain Score  0 *  Have you tolerated food without any problems? Yes.    Have you been able to return to your normal activities? Yes.    Do you have any questions about your discharge instructions: Diet   No. Medications  No. Follow up visit  No.  Do you have questions or concerns about your Care? No.  Actions: * If pain score is 4 or above: No action needed, pain <4.

## 2021-07-03 ENCOUNTER — Ambulatory Visit: Payer: Medicare Other

## 2021-07-05 ENCOUNTER — Telehealth: Payer: Self-pay | Admitting: Internal Medicine

## 2021-07-05 NOTE — Telephone Encounter (Signed)
Given the fact that the appointment is only 4 days away it is possibly best that she just waits till she sees me and then we can have a conversation.  If she is very sick then she should go to the ER or an urgent care

## 2021-07-05 NOTE — Telephone Encounter (Signed)
Called and spoke with patient and she stated that when she was in the hospital she was taken off of all of her asthma medications. Patient states she has been off of her medication since November 24th. She is wanting to know if Dr Chase Caller wants her to restart all of her medications before her follow up with him on 07/09/2021 or to wait till the office visit.   Please advise Dr Chase Caller

## 2021-07-05 NOTE — Telephone Encounter (Signed)
Called and spoke with patient to let her know the recs from Dr. Chase Caller. She expressed understanding and said she would bring everything with her to her appt Tuesday. Nothing further needed at this time.

## 2021-07-09 ENCOUNTER — Other Ambulatory Visit: Payer: Self-pay

## 2021-07-09 ENCOUNTER — Encounter: Payer: Self-pay | Admitting: Internal Medicine

## 2021-07-09 ENCOUNTER — Ambulatory Visit: Payer: Medicare Other | Admitting: Internal Medicine

## 2021-07-09 VITALS — BP 124/62 | HR 96 | Temp 98.3°F | Ht 65.0 in | Wt 123.8 lb

## 2021-07-09 DIAGNOSIS — J4550 Severe persistent asthma, uncomplicated: Secondary | ICD-10-CM

## 2021-07-09 NOTE — Progress Notes (Signed)
C.C.:  Follow-up for Severe, Persistent Asthma, Chronic Allergic Rhinitis, DVT, & GERD w/ Hiatal Hernia.  HPI Patient has been seen twice in our office since her last appointment with me. Last visit was on 9/26 with nurse practitioner. Patient was treated for an exacerbation of her asthma with bronchitis. She was given a prednisone taper, instructed to use Mucinex twice daily as needed, and a 7 day course of Augmentin. Since her last appointment she fractured her left hip. She is continuing to walk with a cane. She reports no adverse effects from her Xolair. She does feel the Xolair is helping her significantly. Hasn't needed her rescue inhaler significantly in the last few weeks. Only rare nocturnal awakenings with any dyspnea.   Severe, persistent asthma: Patient currently maintained on a regimen of Xolair, Singulair, Spiriva, and Symbicort. She feels her dyspnea has significantly improved. She still has a scratchy throat & hoarse voice. She has her baseline nonproductive cough. No wheezing.  Chronic allergic rhinitis: Previously referred to ENT. Regimen at last appointment with me included Allegra, Singulair, Nasonex, and Xolair. Patient has had multiple different antibiotic courses for recurrent sinusitis. Only having intermittent sinus congestion & drainage.   DVT: Present in bilateral lower extremities. Patient on systemic anticoagulation with Xarelto indefinitely. No melena or hematochezia. No hematuria.   GERD with hiatal hernia: Previously prescribed Nexium. No reflux or dyspepsia. No morning brash water taste.   Review of Systems  No chest pain or tightness. No fever or chills. No abdominal pain or nausea.    OV 06/15/2017  Chief Complaint  Patient presents with   Follow-up    flare ups with asthma when the weather changes.  Right now it is a cough, with congestions and wheezing. Non-productive dry cough. SOB with any activity.  Hoarseness.   88 year old female.  Transfer of  care from Dr. Ashok Cordia who is left the practice.  Moderate persistent asthma with an elevated IgE.  She is here with her husband.  She tells me that in 2015-2017 she was started on Xolair and then subsequently switched in [place] last year or year before last.  This then caused nonspecific side effect such as erythema in her face and nonspecific pain that reminded her of shingles.  Therefore she discontinued this and the side effects resolved.  She did have 6 doses of the interleukin-5 receptor antibody.  She went back on Xolair.  Overall she feels that since starting Xolair her quality of life and asthma exacerbations have improved but nevertheless she still gets asthma exacerbations every few months and has to go on prednisone.  Currently she states that for the last few to several days she has had increased cough although no change in sputum.  She is also wheezing a little bit more than usual.  Asthma control questionnaire shows for the last week she is waking up a few times at night.  When she wakes up she has moderate symptoms.  Her activities are slightly limited because of asthma.  She is moderate amount of shortness of breath because of asthma and she is wheezing a little of the time and using albuterol for rescue at least 1 or 2 times daily.  Exam nitric oxide is significantly elevated at 125 ppb.  She think she will benefit from prednisone  She she reports to me for the first time a new issue of left anterior cervical lymph node/submandibular lymph node.  She says it is been present for a while but other  physicians were examined it have not felt it.  She asked me to examine it.  She feels it is slowly growing.  Insidious onset for a year.   OV 08/13/2017  Chief Complaint  Patient presents with   Follow-up    Pt states she has had many flare-ups with her asthma. States she has had a lot of sinus drainage and congestion and a lot of chest tightness.    88 year old female with complicated asthma    poor control of asthma continues.  At my last visit she did a prednisone taper.  Then a few weeks ago her sinuses acted up and therefore her asthma acted up and she did another prednisone taper on herself at home but it was just for a few days.  She continues on Spiriva, Symbicort, Singulair, Nexium and Xolair injections.  She feels that ever since Dr. Asencion Noble retired her asthma has been out of control.  Currently for the last few weeks her sinuses have been acting up and she wants antibiotics.  In particular she wants Augmentin.  She says in the past this was deemed as an allergy but she went and saw some doctor in [place] and it looks like she has been desensitized.  Penicillin is no longer listed as an allergy.  She insists that she wants Augmentin.  Review of the chart shows she has had chronic sinusitis and a CT scan of the sinus 1 year ago in February 2018.  She says she has seen ENT for this.  No surgery has been recommended.  Just nasal hygiene and antibiotic courses.  Currently her symptoms are quite significant with asthma control question of greater than 2 it appears this might all just be her baseline.  Her exam nitric oxide is still high over 100.  At night she is waking up several times.  When she wakes up she has moderate symptoms.  Activities are slightly limited because of asthma.  She has moderate amount of shortness of breath because of asthma.  She is wheezing hardly and she uses albuterol for rescue at least 1 or 2 times daily.   OV  Chief Complaint  Patient presents with   Follow-up    Pt states her breathing has been much better since she began new injection but states she is coughing more than before. Denies any CP.    Follow-up severe persistent asthma with eosinophilia: In April 2019 we started dupulimab and stop the Xolair. With this asthma control is significantly better. Her asthma control questionnaire average score has drop to 0.6. Her exhaled nitric oxide is  drop from 100s to 43. She says she is much less short of breath and is able to do a lot more things. She is very pleased with the new injectable. However she feels that her baseline chronic cough is slightly worse. It is moderate in intensity is dry. It does not wake up at night. It is not associated with wheezing or shortness of breath. It is associated with Spiriva MDI. She does clear her throat. Coughing is made worse by talking. Off note, she has new issue of left hip surgery coming up in 2 days. She feels from a pulmonary standpoint she will be stable to do the surgery because of prior experience with surgery.        OV 03/08/2018  Subjective:  Patient ID: [name], female , DOB: 12/18/1932 , age 65 y.o. , MRN: [id number] , ADDRESS: [place] [address]  03/08/2018 -   Chief Complaint  Patient presents with   Follow-up    Sinus draniage and more cough    HPI Ruchy Gasparyan 88 y.o. -returns for asthma follow-up.  However the issue today is that she reports worsening of sinus drainage for the last 1 month ever since the fall weather change.  He says the sinus drainage is so significant that it is making her cough.  She feels asthma itself is under control.  In fact exam nitric oxide today is 38.  She is continuing inhalers Singulair Nexium and injection dupilumab.  She refused flu shot because she is allergic to it.  Review of the chart indicates March 2018 Dr. Ashok Cordia did a CT scan of the sinus and it showed pansinusitis.  She is says she is seen ENT from 2 years ago and is unclear if it was before the scan or after the scan.  At this point in time she just wants relief from her current sinus issue.  The sinus drainage is possibly clear but she has not looked at it.  There is no fever worsening wheeze or chest tightness.  05/17/2018 Patient presents today with acute complaints of cough and congestion. Accompanied by husband. Productive cough with clear mucus since  05/05/18. Husband and brother were both sick. She was seen at [facility] on 12/29 and given steroid injection and amoxicillin course. Finished antibiotic 2 days ago. Did not notice much of an improvement, if any. She has taken mucinex and delsym for cough. Hasn't had to use rescue inhaler or nebulizer. Due for Dupixent injection on 05/25/18. Did not receive flu injection d/t allergy. Afebrile.   05/25/2018 Patient presents for 1 week follow-up for asthma exacerbation. CXR showed no acute cardiopulmonary disease. Treated with prednisone taper. Due for dupixent injection today. Feeling better than she did last week, still has a cough but sputum has cleared. Felt better yesterday, rain made her symptoms worse. States anytime the weather changes her asthma flares. Using nebulizer as needed, didn't use it as much yesterday. Required a treatment this morning. Takes Singulair and allegra. Afebrile.       OV 06/08/2018  Subjective:  Patient ID: [name], female , DOB: Jan 23, 1933 , age 42 y.o. , MRN: [id number] , ADDRESS: [place] [address]   06/08/2018 -   Chief Complaint  Patient presents with   Follow-up    Pt states she has had sinus problems, cough with clear mucus, and has also had some chest pain/tightness. Pt has been seen by Derl Barrow for asthma exacerbations a couple different times. Pt denies any fever.   Severe asthma on Dupixent.  Also with chronic sinusitis  HPI Delina Cowell 88 y.o. -returns for follow-up of her issues.  I last saw her in October 2019.  After that earlier this month she came to see nurse practitioner 2 times for acute on chronic sinus issues.  She tells me that she is tolerating her Dupixent injections just fine.  She feels it is helping her.  However, she is bothered by sinus issues this constantly sinus drainage particularly flared up in January 2020.  She recollects seeing Dr. Redmond Baseman in ENT.  At the time we recommended amoxicillin but she was allergic  to it and no antibiotic therefore was prescribed according to history.  Since then she has seen an allergist in her local area and is been cleared to get amoxicillin.  She is okay seeing her ENT but she wants to change to  her husband's ENT surgeon Dr. Elgie Congo.  Early this month with a sinus flush she got antibiotic and prednisone this helped her.  However she still has significant amount of residual symptoms.  Asthma control question is 2.8 but the nitric oxide level is normal.  This was excessive symptom burden.  She is waking up several times at night.  When she wakes up she has moderate symptoms she is moderately limited in her activities.  Moderate amount of shortness of breath and wheezing a little of the time and using albuterol for rescue at least 1 time daily.  She says is unimproved symptomatology because of the recent antibiotic and prednisone.  She feels she needs another course of antibiotic and prednisone as well.  In addition with the cough she is complaining of musculoskeletal pain in the upper chest and the back.  She feels the lungs are hurting.  There is no radiation.  The course is improving.  Of note, her past medical history lists her as having rheumatoid arthritis but she denies that to be the case.  She says she has osteoarthritis.  She does not see a rheumatologist.  She has had no prior immune work-up.    OV 02/28/2019  Subjective:  Patient ID: [name], female , DOB: [birth year] , age 69 y.o. , MRN: [id number] , ADDRESS: [place] [address]   02/28/2019 -   Chief Complaint  Patient presents with   Follow-up    She reports her breathing has been at her baseline.Allergy to eggs, no flu shot. Reports her dupixent has been great.      HPI Colby Popoff 88 y.o. -  Follow-up chronic severe persistent asthma: She is now on Dupixent, Symbicort, Spiriva and Singulair.  She says her asthma is well under control.  Albuterol rescue use is rare.  No  nocturnal awakenings.  She has deferred flu shot because she states "I cannot take it"   Follow-up chronic sinusitis: She has seen ENT Dr. Lorelee Cover and has been prescribed Nasonex.  This is now under control.  New issue of ANA positivity 1: 1280 -in January 2020 because of chronic sinusitis I have ordered autoimmune and vasculitis profile.  Her ANCA profile is negative.  However her ANA is strongly positive.  She admits to having osteoarthritis and osteoporosis.  She denies she has rheumatoid arthritis although this is mentioned in her chart as past medical history.  She denies having seen a rheumatologist.  Review of the chart does not indicate she has had rheumatoid factor tested with an health system.    ROS - per HPI   OV 07/11/2019  Subjective:  Patient ID: [name], female , DOB: October 23, 1932 , age 17 y.o. , MRN: [id number] , ADDRESS: [place] [address]   07/11/2019 -   Chief Complaint  Patient presents with   Follow-up    Pt states she has been doing good since last visit. States she does have an occ cough.     HPI Ersel Piechocki 88 y.o. -  Follow-up chronic severe asthma: Last seen in October 2020.  Since then she continues with her durvalumab on a scheduled basis.  She visits our office for that.  However she has become less compliant with the Symbicort.  She for the month of February 2021 she is only taken it 2 times.  She also takes Spiriva only rarely.  She takes these 2 on a as needed basis.  She is compliant with her Singulair  on a daily basis.  Albuterol is also as needed.  Today because of the weather changes rainy and warm she feels a remote tightness in her chest.  We do not have exam nitric oxide test.  Asthma control question is 0.8 and is at the upper limit of good control.  She feels the Dupixent has worked tremendously well for her  In terms of her chronic sinusitis: She follows with ENT and takes Nasonex   In terms of her strong positivity for  ANA: Last visit check extended autoimmune profile.  SSA was positive.  She then had a televisit in December 2020 with Dr. Keturah Barre.  I reviewed that note.  She has a face-to-face visit coming up in March 2021.  She does admit to dryness of the eyes.  She might have Sjogren's and I informed this to her.   In terms of vaccine counseling.  She has had allergy to flu shot.  She has other allergies documented below.  She does not want to do the COVID-19 vaccine.  She is social distancing.  She does outdoor church.  She wants to know when life will return to normal.  We discussed summer is a possibility.   OV 01/10/2020   Subjective:  Patient ID: [name], female , DOB: [birth year], age 70 y.o. years. , MRN: [id number],  ADDRESS: [address] [place] [address] PCP  Crist Infante, MD Providers : Treatment Team:  Attending Provider: Brand Males, MD   Chief Complaint  Patient presents with   Follow-up    Severe Asthma       HPI [facility] Bernick 88 y.o. -returns for follow-up of asthma.  Since her last visit she still remains Covid unvaccinated.  She is scared of the Covid vaccine because of egg allergy.  Because of egg allergy she normally does not do flu shot.  She has done other vaccines.  Asthma continues to be stable.  She is taking Dupixent at full dose every 2 weeks in our office.  She does not want to do this at home.  She does not want to reduce the dose.  She also takes Symbicort at 160/4.52 puff 2 times daily.  She is willing to reduce the dose for this ACT control score is 19 showing good control.  She recently relocated her sister who is 49 years old and has been doing chores.  She is trying to enjoy her vacation [place] mountains in  [place].  Of note even though ACT score is 19 and suggest poor control she tells me she barely ever wakes up in the middle of the night from asthma.  She is not using her albuterol for rescue.  She feels asthma is well  controlled.    Asthma Control Panel  10/11/15 - IgE - 425 , rest of blood allergy panel negative. Blood eos 300cells (al;so 300 on 12/19/16). CT Sinus feb 2018 - chronic pan sinusitis     07/02/2016 06/15/2017  08/13/2017  12/01/2017  06/08/2018  01/10/2020   Current Med Regimen  Spriva, symbicort,  Singulair, xolair, nexium same Spiriva, Symbicort, Singulair, Nexium and dupulimab Spiriva, Symbicort, Singulair, Nexium and dupulimab   Act - ACT - a GSK test - 4 week. Total score. Max is 25, Lower score is worse.  <19 = poor control      19  ACQ 5 point- 1 week. wtd avg score. <1.0 is good control 0.75-1.25 is grey zone. >1.25 poor control. Delta 0.5 is clinically  meaningful   2.4 0.6 2.9   FeNO ppB  126 ppb 104 ppb 43 19   FeV1  1.777/90%, ratio 74,        Planned intervention  for visit   HRCT, talk to eye doc and if ok start dupixent, augmenting, prednsione,        Asthma Control Test ACT Total Score  01/10/2020 19     OV 08/08/2020  Subjective:  Patient ID: [name], female , DOB: 04-Dec-1932 , age 67 y.o. , MRN: [id number] , ADDRESS: [address] [place] [address] PCP Crist Infante, MD Patient Care Team: Crist Infante, MD as PCP - General (Internal Medicine) Nahser, Wonda Cheng, MD as PCP - Cardiology (Cardiology) Nahser, Wonda Cheng, MD as Consulting Physician (Cardiology)  This Provider for this visit: Treatment Team:  Attending Provider: Brand Males, MD    08/08/2020 -   Chief Complaint  Patient presents with   Follow-up    Pt states she has been doing good since last visit. States she did have a flare up with allergies 2 weeks ago and states that she is a little SOB today.     HPI Azelie Mungin 88 y.o. - presents for asthma follow-up.  She tells me that 2 weeks ago because of fall in the spring she started having sinus complaints and since then she has had more chest tightness.  Last few days even cough.  Last visit she decreased her Symbicort at 50% of  dosage.  She feels she needs to go up on it.  She is compliant with Singulair and Dupixent.  She wants to do Dupixent at home.  Today she is feels that she needs help with the asthma because it is an exacerbation.  There is no fever no nocturnal awakening or chest pain orthopnea proximal nocturnal dyspnea.   OV April 2022  08/17/2020 Follow up : Asthma  Patient presents for a 1 week follow-up.  Patient was seen last week for a follow-up for asthma.  She was having more asthma symptoms with cough and wheezing.  She was given a prednisone taper.  Which she says she finished yesterday.  She is feeling much better.  Symptoms are returning back to her baseline.  She has minimum cough and wheezing.  She denies any fever or discolored mucus.  She remains on maintenance therapy with Symbicort twice daily.  She says she had only been taking 1 puff twice daily and now over the last week is increased back up to 2 puffs twice daily.  She is on Singulair daily.  And is on Dupixent.  She says since starting [place] she has felt so much better with her asthma.  And has had less exacerbations.  Patient is starting home injections soon.  And does endorse that she has a up-to-date EpiPen at home.  OV 12/26/2020  Subjective:  Patient ID: [name], female , DOB: Aug 11, 1932 , age 88 y.o. , MRN: [id number] , ADDRESS: [address] [place] [address] PCP Crist Infante, MD Patient Care Team: Crist Infante, MD as PCP - General (Internal Medicine) Nahser, Wonda Cheng, MD as PCP - Cardiology (Cardiology) Nahser, Wonda Cheng, MD as Consulting Physician (Cardiology)  This Provider for this visit: Treatment Team:  Attending Provider: Brand Males, MD    12/26/2020 -   Chief Complaint  Patient presents with   Follow-up    Pt states she has a cough she can not get rid of also SOB. Pt states symbicort was not working  before running out of medication she not sure if it was because it was outdated.       HPI Gazelle Weir 88 y.o. -follow-up asthma with sinus drainage.  She continues on Symbicort, Dupixent and Singulair.  She feels her asthma is at baseline even though ACT score shows significant symptoms.  She feels the symptoms are because of postnasal drainage for which she has seen ENT.  She is on nasal steroid, Tessalon cough spells, Chlor-Trimeton, Delsym and Mucinex.  She feels this is resistant to treatment.  This is a chronic problem.  She has seen ENT for this.  She is resigned to this being the baseline.  She says considering everything she is doing well.  She did have specific question about getting EVUSHELD.  She says in December 2020 when she took the [employer]  After that she got blood clot in her left lower extremity.  Review of the medication shows she is on Xarelto.  After that she has not taken mRNA vaccines.  She is nervous about it and she says she is allergic to those.  I do not know what type of allergy.  She says primary care physician referred her for EVUSHE:D but she says she was declined.  She is asking if it is a good idea.  I did indicate to her that given the fast of time and given the fact that she is reluctant to take the other vaccine and she has had issues with the [employer] vaccine but is probably a good idea to get monoclonal antibody prophylaxis particularly given the recent variatnts.  She is asking me to make a referral.        OV 07/09/2021  Subjective:  Patient ID: [name], female , DOB: 1932-09-29 , age 46 y.o. , MRN: [id number] , ADDRESS: [address] [place] [address] PCP Crist Infante, MD Patient Care Team: Crist Infante, MD as PCP - General (Internal Medicine) Nahser, Wonda Cheng, MD as PCP - Cardiology (Cardiology) Nahser, Wonda Cheng, MD as Consulting Physician (Cardiology)  This Provider for this visit: Treatment Team:  Attending Provider: Brand Males, MD    07/09/2021 -   Chief Complaint   Patient presents with   Follow-up     HPI [facility] 88 y.o. -asthma follow-up.  Last seen in the summer 2022.  She was on Dupixent and Symbicort and Singulair.  She is also on sinus treatment with different nasal sprays.  After that in November 2022 she fell and fractured her right hip and status post surgery.  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  Then she had COVID early December 2022 for which she was not hospitalized.  All this is resulted in significant weight loss and physical deconditioning.  She is living at home with her husband who is quite active.  She is slowly gaining her strength back.  Through all this since the hospitalization in November 2022 she showed discharge summary where the hospitalist stopped her Symbicort and Dupixent.  She is unclear why.  I read the discharge summary and asthma is not mentioned.  We do not know why this medicine was stopped.  However for the last 1 week she feels because of the COVID recently and also the pollen she is having slightly more respiratory symptoms.  So 1 week ago she restarted her Symbicort and she is beginning to feel better.  Her albuterol rescue use is only 1 time per week.  She is not having any nocturnal symptoms.  We discussed about starting Dupixent right now.  She has a sample with her that will last her till July 2023.  I asked her about the option of not starting Dupixent but just waiting and watching with just Symbicort and Singulair but seeing her rather than shorter interval for follow-up.  She is open to this idea.  We will do some evaluation at that time to see how she is objectively with her asthma.  In the past nitric oxide exhalation test was high       Asthma Control Panel  10/11/15 - IgE - 425 , rest of blood allergy panel negative. Blood eos 300cells (al;so 300 on 12/19/16). CT Sinus feb 2018 - chronic pan sinusitis     07/02/2016 06/15/2017  08/13/2017  12/01/2017  06/08/2018   01/10/2020  12/26/2020  07/09/2021   Current Med Regimen  Spriva, symbicort,  Singulair, xolair, nexium same Spiriva, Symbicort, Singulair, Nexium and dupulimab Spiriva, Symbicort, Singulair, Nexium and dupulimab     Act - ACT - a GSK test - 4 week. Total score. Max is 25, Lower score is worse.  <19 = poor control      19 16 15   ACQ 5 point- 1 week. wtd avg score. <1.0 is good control 0.75-1.25 is grey zone. >1.25 poor control. Delta 0.5 is clinically meaningful   2.4 0.6 2.9     FeNO ppB  126 ppb 104 ppb 43 19     FeV1  1.777/90%, ratio 74,          Planned intervention  for visit   HRCT, talk to eye doc and if ok start dupixent, augmenting, prednsione,          Asthma Control Test ACT Total Score  07/09/2021 15  12/26/2020 16  08/17/2020 17     PFT  PFT Results Latest Ref Rng & Units 07/02/2016 01/16/2016 10/31/2015 08/03/2015  FVC-Pre L 2.39 2.26 1.88 2.29  FVC-Predicted Pre % 90 84 70 85  FVC-Post L 2.51 2.57 2.05 2.53  FVC-Predicted Post % 95 96 76 94  Pre FEV1/FVC % % 74 68 67 71  Post FEV1/FCV % % 77 76 70 72  FEV1-Pre L 1.77 1.54 1.26 1.63  FEV1-Predicted Pre % 90 78 63 81  FEV1-Post L 1.93 1.95 1.44 1.82  DLCO uncorrected ml/min/mmHg - - - 17.59  DLCO UNC% % - - - 69  DLCO corrected ml/min/mmHg - - - 18.36  DLCO COR %Predicted % - - - 72  DLVA Predicted % - - - 97  TLC L - - - 5.97  TLC % Predicted % - - - 115  RV % Predicted % - - - 143       has a past medical history of Anemia, Angio-edema, Asthma, severe persistent, Cancer (HCC), Chronic kidney disease, stage II (mild), DDD (degenerative disc disease), cervical, Diverticulitis, Diverticulosis (yrs ago), Edema, lower extremity, GERD (gastroesophageal reflux disease), Headache, Heart murmur, History of breast cancer (1990 left mastectomy also), History of DVT of lower extremity (5 yrs ago), History of shingles, Hyperlipidemia, Internal hemorrhoid, Leg ulcer, left ([place]), Osteoporosis, unspecified, Perennial allergic rhinitis,  Peripheral vascular disease ([place]), Pneumonia, Restless legs syndrome (RLS), Rheumatoid arthritis ([place]), Thyroid nodule, Unspecified essential hypertension, and Urticaria.   reports that she has never smoked. She has never used smokeless tobacco.  Past Surgical History:  Procedure Laterality Date   CARDIAC CATHETERIZATION  08/ 01/ 2008   dr Cathie Olden  normal -- EF of 65%   CARDIOVASCULAR STRESS TEST  05-22-2011   dr Cathie Olden   normal lexiscan perfusion study/  no ischemia/  lvsf  86%   CATARACT EXTRACTION W/ INTRAOCULAR LENS  IMPLANT, BILATERAL     CHOLECYSTECTOMY  1993   laparoscopic   CONVERSION TO TOTAL HIP Left 12/03/2017   Procedure: CONVERSION TO LEFT TOTAL HIP;  Surgeon: Paralee Cancel, MD;  [facility];  Service: Orthopedics;  Laterality: Left;  90 mins   HIP ARTHROPLASTY Left 12/22/2016   Procedure: HEMIARTHROPLASTY, LEFT;  Surgeon: Paralee Cancel, MD;  [facility];  Service: Orthopedics;  Laterality: Left;   INCISION AND DRAINAGE OF WOUND Left 10/24/2013   Procedure: IRRIGATION AND DEBRIDEMENT OF LEFT LEG WITH PLACEMENT OF ACELL AND WOUND [place];  Surgeon: Theodoro Kos, DO;  Location: [place];  Service: Plastics;  Laterality: Left;   KNEE ARTHROPLASTY     LUMBAR [place] Bilateral rigth 1989///   left  1990   breast cancer 1989//   fibrocytic disease 1990   ORIF HUMERUS FRACTURE Right 09/21/2018   Procedure: OPEN REDUCTION INTERNAL FIXATION (ORIF) PROXIMAL HUMERUS FRACTURE;  Surgeon: Nicholes Stairs, MD;  Location: [place];  Service: Orthopedics;  Laterality: Right;  90 mins   TOTAL ABDOMINAL HYSTERECTOMY W/ BILATERAL SALPINGOOPHORECTOMY  1989   done at same time as right mastectomy   TOTAL HIP ARTHROPLASTY Right 04/02/2021   Procedure: TOTAL HIP ARTHROPLASTY ANTERIOR APPROACH;  Surgeon: Paralee Cancel, MD;  [facility];  Service: Orthopedics;  Laterality: Right;   TOTAL KNEE ARTHROPLASTY Right 10/05/2012   Procedure:  RIGHT TOTAL KNEE ARTHROPLASTY;  Surgeon: Mauri Pole, MD;  [facility];  Service: Orthopedics;  Laterality: Right;    Allergies  Allergen Reactions   Codeine Nausea And Vomiting   Egg Phospholipids    Eggs Or Egg-Derived Products     UNSPECIFIED REACTION  RAW EGGS.. Can eat cooked eggs   Hydrocodone Nausea And Vomiting   Influenza Vaccines     UNSPECIFIED REACTION  Egg allergy    Nucala [Mepolizumab]     UNSPECIFIED REACTION    Cephalosporins Rash   Gabapentin Nausea Only   Levofloxacin Rash   Pneumococcal Vaccines Rash    Immunization History  Administered Date(s) Administered   Influenza Split 01/24/2009, 06/03/2011, 08/12/2012, 09/05/2014   Janssen (J&J) SARS-COV-2 Vaccination 04/19/2020   Pneumococcal Conjugate-13 09/14/2012   Pneumococcal Polysaccharide-23 01/31/2009, 06/03/2011   Td 06/03/2011   Tdap 09/07/2018   Zoster Recombinat (Shingrix) 04/18/2017, 09/16/2017   Zoster, Live 07/30/2007, 06/03/2011    Family History  Problem Relation Age of Onset   Heart attack Mother    Asthma Mother    Heart disease Mother    Hyperlipidemia Mother    Allergic rhinitis Mother    Heart attack Father    Heart failure Father    Deep vein thrombosis Father    Heart disease Father    Peripheral vascular disease Father        amputation   Asthma Brother    Allergic rhinitis Brother    Asthma Sister    Eczema Sister    Allergic rhinitis Sister    Cancer Brother    Asthma Brother    Coronary artery disease Sister    Heart disease Sister    Asthma Maternal Grandmother    Urticaria Neg Hx      Current Outpatient Medications:    albuterol (PROAIR  HFA) 108 (90 Base) MCG/ACT inhaler, Inhale 2 puffs into the lungs every 6 (six) hours as needed for wheezing or shortness of breath., Disp: 18 g, Rfl: 5   budesonide-formoterol (SYMBICORT) 160-4.5 MCG/ACT inhaler, 2 puffs po bid (but as of 11/22/11 on a break for two weeks for throat irritation but going to resume it  soon), Disp: , Rfl:    Calcium Carbonate-Vitamin D 600-400 MG-UNIT tablet, Take 1 tablet by mouth daily., Disp: , Rfl:    chlorpheniramine (CHLOR-TRIMETON) 4 MG tablet, Take 4-8 mg by mouth See admin instructions. 4 mg in the morning and 8 mg at bedtime, Disp: , Rfl:    Cholecalciferol (VITAMIN D3) 1000 UNITS tablet, Take 1,000 Units by mouth every evening. , Disp: , Rfl:    dextromethorphan (DELSYM) 30 MG/5ML liquid, Take 60 mg by mouth 2 (two) times daily as needed for cough., Disp: , Rfl:    docusate sodium (COLACE) 100 MG capsule, Take 1 capsule (100 mg total) by mouth 2 (two) times daily., Disp: 10 capsule, Rfl: 0   esomeprazole (NEXIUM) 40 MG capsule, Take 40 mg by mouth daily at 12 noon., Disp: , Rfl:    fexofenadine (ALLEGRA) 180 MG tablet, Take 180 mg by mouth daily.  , Disp: , Rfl:    furosemide (LASIX) 20 MG tablet, TAKE 1 TABLET (20 MG TOTAL) BY MOUTH DAILY AS NEEDED FOR FLUID OR EDEMA. (Patient taking differently: Take 20 mg by mouth daily as needed for fluid.), Disp: 90 tablet, Rfl: 3   ipratropium (ATROVENT) 0.06 % nasal spray, Place 2 sprays into both nostrils 2 (two) times daily., Disp: , Rfl:    methocarbamol (ROBAXIN) 500 MG tablet, Take 1 tablet (500 mg total) by mouth every 6 (six) hours as needed for muscle spasms., Disp: 40 tablet, Rfl: 0   mometasone (NASONEX) 50 MCG/ACT nasal spray, USE 2 SPRAYS NASALLY 2 TIMES DAILY (Patient taking differently: 2 sprays daily.), Disp: 17 g, Rfl: 11   montelukast (SINGULAIR) 10 MG tablet, TAKE 1 TABLET BY MOUTH EVERY NIGHT AT BEDTIME, Disp: 90 tablet, Rfl: 2   Multiple Vitamin (MULTIVITAMIN WITH MINERALS) TABS tablet, Take 1 tablet by mouth daily. Centrum Silver Multivitamin, Disp: , Rfl:    multivitamin-lutein (OCUVITE-LUTEIN) CAPS capsule, Take 1 capsule by mouth daily., Disp: , Rfl:    mupirocin ointment (BACTROBAN) 2 %, as needed. , Disp: , Rfl:    nitroGLYCERIN (NITROSTAT) 0.4 MG SL tablet, Place 1 tablet (0.4 mg total) under the tongue  every 5 (five) minutes as needed for chest pain., Disp: 25 tablet, Rfl: 3   potassium chloride (KLOR-CON) 10 MEQ tablet, TAKE 1 TABLET BY MOUTH EVERY DAY, Disp: 90 tablet, Rfl: 3   pramipexole (MIRAPEX) 0.125 MG tablet, Take 0.375 mg by mouth at bedtime., Disp: , Rfl:    rivaroxaban (XARELTO) 10 MG TABS tablet, Take 10 mg by mouth daily., Disp: , Rfl:    rosuvastatin (CRESTOR) 5 MG tablet, TAKE 1 TABLET BY MOUTH EVERYDAY AT BEDTIME (Patient taking differently: Take 5 mg by mouth daily.), Disp: 90 tablet, Rfl: 3   Spacer/Aero-Holding Chambers (AEROCHAMBER MV) inhaler, by Other route. Use as instructed , Disp: , Rfl:    telmisartan (MICARDIS) 20 MG tablet, Take 20 mg by mouth daily. , Disp: , Rfl:    vitamin B-12 (CYANOCOBALAMIN) 500 MCG tablet, Take 500 mcg by mouth daily.  , Disp: , Rfl:   Current Facility-Administered Medications:    albuterol (VENTOLIN HFA) 108 (90 Base) MCG/ACT inhaler 2 puff, 2  puff, Inhalation, Once PRN, Causey, Charlestine Massed, NP   diphenhydrAMINE (BENADRYL) injection 50 mg, 50 mg, Intramuscular, Once PRN, Causey, Charlestine Massed, NP   EPINEPHrine (EPI-PEN) injection 0.3 mg, 0.3 mg, Intramuscular, Once PRN, Delice Bison, Charlestine Massed, NP      Objective:   Vitals:   07/09/21 1411  BP: 124/62  Pulse: 96  Temp: 98.3 F (36.8 C)  TempSrc: Oral  SpO2: 97%  Weight: 123 lb 12.8 oz (56.2 kg)  Height: 5\' 5"  (1.651 m)    Estimated body mass index is 20.6 kg/m as calculated from the following:   Height as of this encounter: 5\' 5"  (1.651 m).   Weight as of this encounter: 123 lb 12.8 oz (56.2 kg).  @WEIGHTCHANGE @  [employer]   07/09/21 1411  Weight: 123 lb 12.8 oz (56.2 kg)     Physical Exam General: No distress. Looks well Neuro: Alert and Oriented x 3. GCS 15. Speech normal Psych: Pleasant Resp:  Barrel Chest - no.  Wheeze - no, Crackles - no, No overt respiratory distress CVS: Normal heart sounds. Murmurs - no Ext: Stigmata of Connective Tissue  Disease - no HEENT: Normal upper airway. PEERL +. No post nasal drip        Assessment:       ICD-10-CM   1. Severe persistent asthma without complication  C16.38 Pulmonary function test    Nitric oxide    CBC with Differential/Platelet         Plan:     Patient Instructions  Severe persistent asthma without complication   -Clinically asthma is under good control other than mild symptoms for which Symbicort in the last 1 week is helping you  -You have been off Symbicort between November 2022 and February 2023 - You have been off [place] between November 2022 and February 2023 -Despite this overall asthma seems to be under decent control although in the last 1 or 2 weeks you had increased mild symptoms for which Symbicort is helping you  Plan - Continue Symbicort scheduled,  -Continue Singulair -Hold off on Dupixent restart given overall stability -Use albuterol as needed -Continue nasal spray as before  Followup  - Do full pulmonary function test in 2 months - Do exhaled  nitric oxide testing in few months - Do CBC with differential in 2 months -Return to see Dr. Chase Caller nurse practitioner in 2 months but after above testing  -If asthma continues to do well we will hold off on [place] restart    SIGNATURE    Dr. Brand Males, M.D., F.C.C.P,  Pulmonary and Critical Care Medicine Staff Physician, [place] Director - Interstitial Lung Disease  Program  Pulmonary Ward at [place], [place], [address]  Pager: [phone], If no answer or between  15:00h - 7:00h: call [phone] Telephone: [phone]  2:41 PM 07/09/2021

## 2021-07-09 NOTE — Patient Instructions (Addendum)
Severe persistent asthma without complication   -Clinically asthma is under good control other than mild symptoms for which Symbicort in the last 1 week is helping you  -You have been off Symbicort between November 2022 and February 2023 - You have been off [place] between November 2022 and February 2023 -Despite this overall asthma seems to be under decent control although in the last 1 or 2 weeks you had increased mild symptoms for which Symbicort is helping you  Plan - Continue Symbicort scheduled,  -Continue Singulair -Hold off on Dupixent restart given overall stability -Use albuterol as needed -Continue nasal spray as before  Followup  - Do full pulmonary function test in 2 months - Do exhaled  nitric oxide testing in few months - Do CBC with differential in 2 months -Return to see Dr. Chase Caller nurse practitioner in 2 months but after above testing  -If asthma continues to do well we will hold off on Dupixent restart

## 2021-07-17 ENCOUNTER — Ambulatory Visit: Payer: Medicare Other

## 2021-07-31 ENCOUNTER — Telehealth: Payer: Self-pay | Admitting: Cardiovascular Disease

## 2021-07-31 NOTE — Telephone Encounter (Signed)
Attempted to call the pt but unable to leave a message X2...will try again later this morning.  ?

## 2021-07-31 NOTE — Telephone Encounter (Signed)
Pt c/o of Chest Pain: STAT if CP now or developed within 24 hours ? ?1. Are you having CP right now? No  ? ?2. Are you experiencing any other symptoms (ex. SOB, nausea, vomiting, sweating)? SOB, sweating ? ?3. How long have you been experiencing CP? Sunday  ? ?4. Is your CP continuous or coming and going? continuous ? ?5. Have you taken Nitroglycerin? Yes  ??  ?

## 2021-08-01 NOTE — Telephone Encounter (Signed)
Called pt back in regards to CP call from 07/31/21.  Pt reports early morning hours Sunday 08/07/21 was woken from sleep with CP.  Reports felt like pressure to center of chest with heavy sweating.  Took X1 NTG with relief and fell back asleep.  Reports felt tired for the rest of the day.  Has had no further CP episodes.  Last BP reading 07/29/21 140/60.  Advised pt if CP occurs again to call 911 for evaluation.  Pt verbalizes understanding.  Pt request an earlier OV with Dr. Acie Fredrickson moved to 08/12/21 at 11:40 am.  Pt had no further concerns.  ?

## 2021-08-06 ENCOUNTER — Ambulatory Visit: Payer: Medicare Other | Admitting: Internal Medicine

## 2021-08-06 ENCOUNTER — Encounter: Payer: Self-pay | Admitting: Internal Medicine

## 2021-08-06 VITALS — BP 138/70 | HR 83 | Ht 65.0 in | Wt 120.5 lb

## 2021-08-06 DIAGNOSIS — R109 Unspecified abdominal pain: Secondary | ICD-10-CM | POA: Diagnosis not present

## 2021-08-06 NOTE — Patient Instructions (Signed)
If you are age 86 or older, your body mass index should be between 23-30. Your Body mass index is 20.05 kg/m?[name] If this is out of the aforementioned range listed, please consider follow up with your Primary Care Provider. ? ?If you are age 25 or younger, your body mass index should be between 19-25. Your Body mass index is 20.05 kg/m?[name] If this is out of the aformentioned range listed, please consider follow up with your Primary Care Provider.  ? ?________________________________________________________ ? ?The [facility] providers would like to encourage you to use [facility] to communicate with providers for non-urgent requests or questions.  Due to long hold times on the telephone, sending your provider a message by [facility] may be a faster and more efficient way to get a response.  Please allow 48 business hours for a response.  Please remember that this is for non-urgent requests.  ?_______________________________________________________ ? ?Please follow up as needed ?

## 2021-08-07 ENCOUNTER — Encounter: Payer: Self-pay | Admitting: Internal Medicine

## 2021-08-07 NOTE — Progress Notes (Signed)
HISTORY OF PRESENT ILLNESS: ? ?[name] is a 88 y.o. female with multiple significant medical problems who was evaluated in the office June 13, 2021 regarding abdominal pain and weight loss.  As well abnormal CT scan of the abdomen.  See that dictation for details.  She underwent upper endoscopy June 18, 2021.  She was found to have scarring of the gastric body consistent with prior ulcer disease.  No active ulcer disease.  Incidental benign fundic gland polyps.  Otherwise normal EGD.  She was continued on pantoprazole, encouraged to use Ensure for weight gain, and follow-up at this time.  The patient states that she lost additional weight since her endoscopy but it has remained stable in recent weeks.  Overall she states she feels better.  No abdominal pain.  She still complains of decreased energy and suboptimal appetite.  About 2 weeks ago she had issues with chest pain for which she took sublingual nitroglycerin.  This helped.  She has an appointment with her cardiologist next week.  Patient did have hip surgery November 2022 followed by COVID infection early December 2022.  Her weight in August 2022 was 143.  In February 2023 was 128.  Today is 120.  She definitely feels that her problems began after surgery and COVID infection.  She describes bowel habits every other day.  No bleeding.  She tells me that she saw her PCP a few weeks ago and had a myriad of blood work which she tells me was unremarkable including a hemoglobin of 14.  She has been using Ensure. ? ?REVIEW OF SYSTEMS: ? ?All non-GI ROS negative unless otherwise stated in the HPI except for cough, arthritis, back pain, shortness of breath, ? ?Past Medical History:  ?Diagnosis Date  ? Anemia   ? after hysterectomy  ? Angio-edema   ? Asthma, severe persistent   ? pulmologist-- dr Lynford Citizen  ? Cancer [facility])   ? Breast cancer on right  ? Chronic kidney disease, stage II (mild)   ? DDD (degenerative disc disease), cervical   ? Diverticulitis    ? Diverticulosis yrs ago  ? hx of   ? Edema, lower extremity   ? occ both legs swell  ? GERD (gastroesophageal reflux disease)   ? Headache   ? sinus headaches  ? Heart murmur   ? no problems - per pt  ? History of breast cancer 1990 left mastectomy also  ? 1989  S/P   RIGHT MASTECTOMY ;  NO CHEMORADIATION //   NO RECURRENCE  ? History of DVT of lower extremity 5 yrs ago  ? right leg  ? History of shingles   ? Hyperlipidemia   ? Internal hemorrhoid   ? Leg ulcer, left ([place])   ? Osteoporosis, unspecified   ? Knee and hip osteoarthritis bilaterally  ? Perennial allergic rhinitis   ? Peripheral vascular disease ([place])   ? Pneumonia   ? Restless legs syndrome (RLS)   ? Rheumatoid arthritis ([place])   ? hands  ? Thyroid nodule   ? followed by dr perrini yearly, no current problam  ? Unspecified essential hypertension   ? Urticaria   ? ? ?Past Surgical History:  ?Procedure Laterality Date  ? CARDIAC CATHETERIZATION  08/ 01/ 2008   dr Cathie Olden  ? normal -- EF of 65%  ? CARDIOVASCULAR STRESS TEST  05-22-2011   dr Cathie Olden  ? normal lexiscan perfusion study/  no ischemia/  lvsf  86%  ? CATARACT EXTRACTION W/ INTRAOCULAR LENS  IMPLANT, BILATERAL    ? CHOLECYSTECTOMY  1993  ? laparoscopic  ? CONVERSION TO TOTAL HIP Left 12/03/2017  ? Procedure: CONVERSION TO LEFT TOTAL HIP;  Surgeon: Paralee Cancel, MD;  [facility];  Service: Orthopedics;  Laterality: Left;  90 mins  ? HIP ARTHROPLASTY Left 12/22/2016  ? Procedure: HEMIARTHROPLASTY, LEFT;  Surgeon: Paralee Cancel, MD;  [facility];  Service: Orthopedics;  Laterality: Left;  ? INCISION AND DRAINAGE OF WOUND Left 10/24/2013  ? Procedure: IRRIGATION AND DEBRIDEMENT OF LEFT LEG WITH PLACEMENT OF ACELL AND WOUND [place];  Surgeon: Theodoro Kos, DO;  Location: [place];  Service: Plastics;  Laterality: Left;  ? KNEE ARTHROPLASTY    ? [place]  ? MASTECTOMY Bilateral rigth 1989///   left  1990  ? breast cancer 1989//   fibrocytic disease 1990   ? ORIF HUMERUS FRACTURE Right 09/21/2018  ? Procedure: OPEN REDUCTION INTERNAL FIXATION (ORIF) PROXIMAL HUMERUS FRACTURE;  Surgeon: Nicholes Stairs, MD;  Location: [place];  Service: Orthopedics;  Laterality: Right;  90 mins  ? TOTAL ABDOMINAL HYSTERECTOMY W/ BILATERAL SALPINGOOPHORECTOMY  1989  ? done at same time as right mastectomy  ? TOTAL HIP ARTHROPLASTY Right 04/02/2021  ? Procedure: TOTAL HIP ARTHROPLASTY ANTERIOR APPROACH;  Surgeon: Paralee Cancel, MD;  [facility];  Service: Orthopedics;  Laterality: Right;  ? TOTAL KNEE ARTHROPLASTY Right 10/05/2012  ? Procedure: RIGHT TOTAL KNEE ARTHROPLASTY;  Surgeon: Mauri Pole, MD;  [facility];  Service: Orthopedics;  Laterality: Right;  ? ? ?Social History ?Leda Gauze Morency  reports that she has never smoked. She has never used smokeless tobacco. She reports that she does not drink alcohol and does not use drugs. ? ?family history includes Allergic rhinitis in her brother, mother, and sister; Asthma in her brother, brother, maternal grandmother, mother, and sister; Cancer in her brother; Coronary artery disease in her sister; Deep vein thrombosis in her father; Eczema in her sister; Heart attack in her father and mother; Heart disease in her father, mother, and sister; Heart failure in her father; Hyperlipidemia in her mother; Peripheral vascular disease in her father. ? ?Allergies  ?Allergen Reactions  ? Codeine Nausea And Vomiting  ? Egg Phospholipids   ? Eggs Or Egg-Derived Products   ?  UNSPECIFIED REACTION  ?RAW EGGS.. Can eat cooked eggs  ? Hydrocodone Nausea And Vomiting  ? Influenza Vaccines   ?  UNSPECIFIED REACTION  ?Egg allergy ?  ? Nucala [Mepolizumab]   ?  UNSPECIFIED REACTION   ? Cephalosporins Rash  ? Gabapentin Nausea Only  ? Levofloxacin Rash  ? Pneumococcal Vaccines Rash  ? ? ?  ? ?PHYSICAL EXAMINATION: ?Vital signs: BP 138/70   Pulse 83   Ht _0  (1.651 m)   Wt 120 lb 8 oz (54.7 kg)   SpO2 96%   BMI 20.05 kg/m?    ?Constitutional: Frail elderly female, no acute distress ?Psychiatric: alert and oriented x3, cooperative ?Eyes: extraocular movements intact, anicteric, conjunctiva pink ?Mouth: oral pharynx moist, no lesions ?Neck: supple no lymphadenopathy ?Cardiovascular: heart regular rate and rhythm, no murmur ?Lungs: clear to auscultation bilaterally ?Abdomen: soft, nontender, nondistended, no obvious ascites, no peritoneal signs, normal bowel sounds, no organomegaly ?Rectal: Omitted ?Extremities: no clubbing, cyanosis, or lower extremity edema bilaterally ?Skin: no lesions on visible extremities ?Neuro: No focal deficits.  Cranial nerves intact ? ?ASSESSMENT: ? ?1.  Previous problems with abdominal pain have resolved. ?2.  Recent  upper endoscopy as described ?3.  Issues with blood loss secondary to decreased appetite. ?4.  Issues with decreased appetite secondary to postoperative recovery and COVID infection.  Stabilizing ? ? ?PLAN: ? ?1.  Encouraged to continue with calorie supplements ?2.  Recommend that she gradually increase her activity level. ?3.  Resume general medical care with PCP ?4.  GI follow-up as needed ? ? ? ? ?  ?

## 2021-08-12 ENCOUNTER — Encounter: Payer: Self-pay | Admitting: Cardiovascular Disease

## 2021-08-12 ENCOUNTER — Ambulatory Visit: Payer: Medicare Other | Admitting: Cardiovascular Disease

## 2021-08-12 VITALS — BP 136/74 | HR 95 | Ht 65.0 in | Wt 123.4 lb

## 2021-08-12 DIAGNOSIS — I119 Hypertensive heart disease without heart failure: Secondary | ICD-10-CM

## 2021-08-12 DIAGNOSIS — I5032 Chronic diastolic (congestive) heart failure: Secondary | ICD-10-CM | POA: Diagnosis not present

## 2021-08-12 NOTE — Progress Notes (Signed)
? ? ?[name] ?Date of Birth  November 17, 1932 ?      ?[facility] ?[place]. [address], [place], [address] ?[place], [place]  [address]   [place], [place]  [address] ?[phone]     [phone]   ?Fax  [phone]    Fax [phone] ? ?Problem List: ?1. History of chest pain-normal coronary arteries by heart catheterization in August, 2008 ?2. History of breast cancer-status post right mastectomy and 1989-status post left mastectomy in 1990 ?3. Hypertension ?4. Hyperlipidemia ?5. Asthma ?6. Chronic diastolic CHF ? ? ?  ? ?[name] has had lots of problems with her asthma this summer.  She's not had any cardiac complaints. She denies any chest pain. She's not been able to exercise much of the summer because her asthma problems. ? ?Oct. 23, 2014: ? ?She has had a total knee replacement since I saw her.   She is back exercising some - rehab for her knee.  Has lost a few lbs.  ? ?Oct. 27, 2015: ? ?[name] is doing ok from a cardiac standpoint. She's had a wound on the left lower leg which she's been tending to.  She has been going to the wound Center.   ?[name] history of asthma and has chronic shortness of breath. No angina like pain.   ? ?Oct. 27, 2016 ? ?Has had lots of breathing issues.  Has had a rough year with her lungs.  ?Recently had PFTs and was found to her 47% predicted capacity ( on one of the measured functions )  ?Wakes up with CP on occasion .   Also has some exertional CP .  ? ?Sep 12, 2015: ? ?[name] is doing ok from a cardiac standpoint.  ?Had reported some  chest pain  ?myoview revealed normal left ventricular systolic function and no ischemia. The echocardiogram revealed normal left ventricle systolic function. She does have grade 1 diastolic dysfunction. She has mild tricuspid regurgitation. ? ?She has not taken her amlodipine this week - due to leg swelling . ?She wants to keep it on her med list just in case her BP goes up .  ? ?She tried Nucala  injections for her asthma.   Did not tolerate it  ? ?Has lost 6 lbs  ? ?Nov. 2, 2017: ? ?Doing well - she is followed for chronic diastolic congestive heart failure. ?Has issues with her asthma .  ?Active but no real exercise  ? ?Nov. 8, 2018:    ? ?[name] was admitted to the hospital on August 10 after falling and fracturing her left hip. ? ?Nov. 8, 2019: ? ?[name] is seen today for follow-up of her chronic diastolic congestive heart failure.  She has chronic asthma and chronic shortness of breath. ?Breathing is about the same .   Is on a new injection .  ?Leg swelling is under control , mild edema today  ?Had a hip replacement this point  ? ?June 20, 2019: ? ?[name] is seen today for a follow-up visit.  She has a history of chest pain  ?Seems to be doing well. ?Has been watching her salt.    ?BP is 146 / 70 ?Still eating salty foods.   - loves her crackers.    ?Wakes at at night with occasional CP .   Lasts for a few min. ?Gets better when she sits up in the side of the bed  ?Does not get any exercise  ?Advised her  to reduce her salt intake  ? ?Feb. 4, 2022: ?[name] is seen today for follow up of her chronic diastolic CHF. ?Has been waking up with CP,  Mid sternal sharp  pain , no radiation ,  Pressure like sensation  ?Lasts for a few minutes.  ?Wakes her up .   Has occurred multiple times.  ? ?August 12, 2021: ?[name] is seen today for follow-up visit.  She has a history of chronic diastolic congestive heart failure.  She was having some midsternal chest pain when I saw her last year. ?Lexiscan Myoview study performed in February, 2022 was normal/low risk. ? ?This past year, fell and broke her hip ?Had COVID ( and possibly had long COVID )  ? ?Had some severe CP several weeks ago  ?Had sweats,  ?Took a SL NTG .   Was able to go back to sleep  ?Did not go to the hospital  ? ?Has not occurred since then, ?Has been back doing her normal activities without any cp ?If she has recurrent CP, will schedule for  a CTA of her coronary arteries ? ?Current Outpatient Medications on File Prior to Visit  ?Medication Sig Dispense Refill  ? Abaloparatide (TYMLOS) 3120 MCG/1.56ML SOPN See admin instructions.    ? albuterol (PROAIR HFA) 108 (90 Base) MCG/ACT inhaler Inhale 2 puffs into the lungs every 6 (six) hours as needed for wheezing or shortness of breath. 18 g 5  ? budesonide-formoterol (SYMBICORT) 160-4.5 MCG/ACT inhaler 2 puffs po bid (but as of 11/22/11 on a break for two weeks for throat irritation but going to resume it soon)    ? Calcium Carbonate-Vitamin D 600-400 MG-UNIT tablet Take 1 tablet by mouth daily.    ? chlorpheniramine (CHLOR-TRIMETON) 4 MG tablet Take 4-8 mg by mouth See admin instructions. 4 mg in the morning and 8 mg at bedtime    ? Cholecalciferol (VITAMIN D3) 1000 UNITS tablet Take 1,000 Units by mouth every evening.     ? dextromethorphan (DELSYM) 30 MG/5ML liquid Take 60 mg by mouth 2 (two) times daily as needed for cough.    ? docusate sodium (COLACE) 100 MG capsule Take 1 capsule (100 mg total) by mouth 2 (two) times daily. 10 capsule 0  ? feeding supplement (ENSURE ENLIVE / ENSURE PLUS) LIQD Take 262ms by mouth twice a day between meals    ? fexofenadine (ALLEGRA) 180 MG tablet Take 180 mg by mouth daily.      ? furosemide (LASIX) 20 MG tablet TAKE 1 TABLET (20 MG TOTAL) BY MOUTH DAILY AS NEEDED FOR FLUID OR EDEMA. 90 tablet 3  ? ipratropium (ATROVENT) 0.06 % nasal spray Place 2 sprays into both nostrils 2 (two) times daily.    ? mirtazapine (REMERON) 15 MG tablet Take 15 mg by mouth at bedtime.    ? mometasone (NASONEX) 50 MCG/ACT nasal spray USE 2 SPRAYS NASALLY 2 TIMES DAILY 17 g 11  ? montelukast (SINGULAIR) 10 MG tablet TAKE 1 TABLET BY MOUTH EVERY NIGHT AT BEDTIME 90 tablet 2  ? Multiple Vitamin (MULTIVITAMIN WITH MINERALS) TABS tablet Take 1 tablet by mouth daily. Centrum Silver Multivitamin    ? multivitamin-lutein (OCUVITE-LUTEIN) CAPS capsule Take 1 capsule by mouth daily.    ? mupirocin  ointment (BACTROBAN) 2 % as needed.     ? nitroGLYCERIN (NITROSTAT) 0.4 MG SL tablet Place 1 tablet (0.4 mg total) under the tongue every 5 (five) minutes as needed for chest pain. 25 tablet 3  ? pantoprazole (  PROTONIX) 40 MG tablet Take 40 mg by mouth daily.    ? potassium chloride (KLOR-CON) 10 MEQ tablet TAKE 1 TABLET BY MOUTH EVERY DAY 90 tablet 3  ? pramipexole (MIRAPEX) 0.125 MG tablet Take 0.375 mg by mouth at bedtime.    ? rivaroxaban (XARELTO) 10 MG TABS tablet Take 10 mg by mouth daily.    ? rosuvastatin (CRESTOR) 5 MG tablet TAKE 1 TABLET BY MOUTH EVERYDAY AT BEDTIME 90 tablet 3  ? Spacer/Aero-Holding Chambers (AEROCHAMBER MV) inhaler by Other route. Use as instructed     ? telmisartan (MICARDIS) 20 MG tablet Take 20 mg by mouth daily.     ? vitamin B-12 (CYANOCOBALAMIN) 500 MCG tablet Take 500 mcg by mouth daily.      ? esomeprazole (NEXIUM) 40 MG capsule Take 40 mg by mouth daily at 12 noon. (Patient not taking: Reported on 08/12/2021)    ? methocarbamol (ROBAXIN) 500 MG tablet Take 1 tablet (500 mg total) by mouth every 6 (six) hours as needed for muscle spasms. (Patient not taking: Reported on 08/12/2021) 40 tablet 0  ? ?Current Facility-Administered Medications on File Prior to Visit  ?Medication Dose Route Frequency Provider Last Rate Last Admin  ? albuterol (VENTOLIN HFA) 108 (90 Base) MCG/ACT inhaler 2 puff  2 puff Inhalation Once PRN Causey, Charlestine Massed, NP      ? diphenhydrAMINE (BENADRYL) injection 50 mg  50 mg Intramuscular Once PRN Causey, Charlestine Massed, NP      ? EPINEPHrine (EPI-PEN) injection 0.3 mg  0.3 mg Intramuscular Once PRN Gardenia Phlegm, NP      ? ? ?Allergies  ?Allergen Reactions  ? Codeine Nausea And Vomiting  ? Ace Inhibitors   ?  Other reaction(s): cough  ? Amlodipine   ?  Other reaction(s): too much edemastopped it around 4/17  ? Egg Phospholipids   ? Eggs Or Egg-Derived Products   ?  UNSPECIFIED REACTION  ?RAW EGGS.. Can eat cooked eggs  ? Ferrous Sulfate   ?   Other reaction(s): hurt stomach.  ? Hydrocodone Nausea And Vomiting  ? Influenza Vaccines   ?  UNSPECIFIED REACTION  ?Egg allergy ?  ? Metoprolol   ?  Other reaction(s): cannot sleep on it and i worry abo

## 2021-08-12 NOTE — Patient Instructions (Signed)
Medication Instructions:  ?Your physician recommends that you continue on your current medications as directed. Please refer to the Current Medication list given to you today. ? ?*If you need a refill on your cardiac medications before your next appointment, please call your pharmacy* ? ?Lab Work: ?NONE ?If you have labs (blood work) drawn today and your tests are completely normal, you will receive your results only by: ?MyChart Message (if you have MyChart) OR ?A paper copy in the mail ?If you have any lab test that is abnormal or we need to change your treatment, we will call you to review the results. ? ?Testing/Procedures: ?NONE ? ?Follow-Up: ?At [facility], you and your health needs are our priority.  As part of our continuing mission to provide you with exceptional heart care, we have created designated Provider Care Teams.  These Care Teams include your primary Cardiologist (physician) and Advanced Practice Providers (APPs -  Physician Assistants and Nurse Practitioners) who all work together to provide you with the care you need, when you need it. ? ?Your next appointment:   ?1 year(s) ? ?The format for your next appointment:   ?In Person ? ?Provider:   ?Philip Nahser, MD  or Vin Bhagat, PA-C or Scott Weaver, PA-C  ?

## 2021-08-19 ENCOUNTER — Telehealth: Payer: Self-pay | Admitting: Internal Medicine

## 2021-08-19 MED ORDER — ALBUTEROL SULFATE (2.5 MG/3ML) 0.083% IN NEBU: 2.5000 mg | INHALATION_SOLUTION | Freq: Four times a day (QID) | RESPIRATORY_TRACT | 12 refills | Status: AC | PRN

## 2021-08-19 NOTE — Telephone Encounter (Signed)
Called and spoke with patient. I advised patient not to use the expired nebulizer solution. I called in a new prescription for patient. Patient verified pharmacy. Nothing further needed.  ?

## 2021-08-26 ENCOUNTER — Ambulatory Visit (INDEPENDENT_AMBULATORY_CARE_PROVIDER_SITE_OTHER): Payer: Medicare Other

## 2021-08-26 ENCOUNTER — Ambulatory Visit: Payer: Medicare Other | Admitting: Cardiovascular Disease

## 2021-08-26 ENCOUNTER — Telehealth: Payer: Self-pay | Admitting: Internal Medicine

## 2021-08-26 ENCOUNTER — Ambulatory Visit: Payer: Medicare Other | Admitting: Adult Health

## 2021-08-26 ENCOUNTER — Encounter: Payer: Self-pay | Admitting: Adult Health

## 2021-08-26 VITALS — BP 130/70 | HR 95 | Temp 97.7°F | Ht 65.0 in | Wt 121.6 lb

## 2021-08-26 DIAGNOSIS — J4550 Severe persistent asthma, uncomplicated: Secondary | ICD-10-CM

## 2021-08-26 DIAGNOSIS — J309 Allergic rhinitis, unspecified: Secondary | ICD-10-CM

## 2021-08-26 MED ORDER — ALBUTEROL SULFATE HFA 108 (90 BASE) MCG/ACT IN AERS: 2.0000 | INHALATION_SPRAY | Freq: Four times a day (QID) | RESPIRATORY_TRACT | 5 refills | Status: DC | PRN

## 2021-08-26 MED ORDER — MONTELUKAST SODIUM 10 MG PO TABS: 10.0000 mg | ORAL_TABLET | Freq: Every day | ORAL | 2 refills | Status: DC

## 2021-08-26 MED ORDER — IPRATROPIUM BROMIDE 0.06 % NA SOLN
2.0000 | Freq: Two times a day (BID) | NASAL | 6 refills | Status: AC
Start: 2021-08-26 — End: ?

## 2021-08-26 MED ORDER — MOMETASONE FUROATE 50 MCG/ACT NA SUSP
NASAL | 11 refills | Status: AC
Start: 2021-08-26 — End: ?

## 2021-08-26 MED ORDER — PREDNISONE 10 MG PO TABS: ORAL_TABLET | ORAL | 0 refills | Status: DC

## 2021-08-26 NOTE — Assessment & Plan Note (Signed)
Flare.  Continue current maintenance regimen.  Add in prednisone taper. ? ?Plan  ?Patient Instructions  ?Chest xray today  ?Prednisone taper over next week .  ?Continue on Symbicort 2 puffs Twice daily   ?Albuterol inhaler or neb as needed.  ?Mucinex DM Twice daily  As needed  cough/congestion .  ?Continue on Singulair , Nasonex, and Allegra daily .  ?Follow up with Dr. Chase Caller as planned and As needed   ?Please contact office for sooner follow up if symptoms do not improve or worsen or seek emergency care  ? ? ? ?  ? ?

## 2021-08-26 NOTE — Patient Instructions (Addendum)
Chest xray today  ?Prednisone taper over next week .  ?Continue on Symbicort 2 puffs Twice daily   ?Albuterol inhaler or neb as needed.  ?Mucinex DM Twice daily  As needed  cough/congestion .  ?Continue on Singulair , Nasonex, and Allegra daily .  ?Follow up with Dr. Chase Caller as planned and As needed   ?Please contact office for sooner follow up if symptoms do not improve or worsen or seek emergency care  ? ? ? ?

## 2021-08-26 NOTE — Assessment & Plan Note (Signed)
Acute asthmatic exacerbation.  Check chest x-ray today. ?Hold on antibiotics at this time.  Prednisone taper.  Continue on aggressive maintenance regimen. ?Keep follow-up with Dr. Chase Caller in 2 weeks. ? ?Plan  ?Patient Instructions  ?Chest xray today  ?Prednisone taper over next week .  ?Continue on Symbicort 2 puffs Twice daily   ?Albuterol inhaler or neb as needed.  ?Mucinex DM Twice daily  As needed  cough/congestion .  ?Continue on Singulair , Nasonex, and Allegra daily .  ?Follow up with Dr. Chase Caller as planned and As needed   ?Please contact office for sooner follow up if symptoms do not improve or worsen or seek emergency care  ? ? ? ?  ? ?

## 2021-08-26 NOTE — Telephone Encounter (Signed)
Spoke with the pt and notified of response per TP  ?Appt with TP scheduled for today at 4:30 pm ?

## 2021-08-26 NOTE — Telephone Encounter (Signed)
Needs office visit  ?I can see today in office or video visit.  ?Please contact office for sooner follow up if symptoms do not improve or worsen or seek emergency care  ? ?

## 2021-08-26 NOTE — Progress Notes (Signed)
? ?'@Patient'$  ID: [name], female    DOB: 02-14-1933, 88 y.o.   MRN: [id number] ? ?Chief Complaint  ?Patient presents with  ? Acute Visit  ? ? ?Referring provider: ?Crist Infante, MD ? 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 ?TEST/EVENTS :  ?CT chest November2015 in March 2016: No reports of emphysema seen on CT chest or interstitial lung disease ?  ?FeNO in our oifice is 47ppb and ELEVATED ?  ?TEST  ?PFT ?07/02/13: FVC 2.39 L (90%) FEV1 1.77 L (90%) FEV1/FVC 0.74 FEF 25-75 1.32 L (95%) negative bronchodilator response ?01/16/16: FVC 2.26 L (84%) FEV1 1.54 L (78%) FEV1/FVC 0.68 FEF 25-75 0.92 L (65%) positive bronchodilator response ?10/31/15: FVC 1.88 L (70%) FEV1 1.26 L (63%) FEV1/FVC 0.67 FEF 25-75 0.68 L (43%) negative bronchodilator response ?08/03/15: FVC 2.29 L (85%) FEV1 1.63 L (81%) FEV1/FVC 0.71 FEF 25-75 1.05 L (73%) positive bronchodilator response TLC 5.97 L (115%) RV 143% ERV 35% DLCO corrected 72% (Hgb 12.1) ?10/10/11: FVC 2.21 L (77%) FEV1 1.57 L (74%) FEV1/FVC 0.71 FEF 25-75 1.10 L (64%) ?  ? ?08/26/2021 Acute OV  : Asthma  ?Patient presents for an acute office visit.  She complains over the last 3 weeks that she has had increased cough that is minimally productive wheezing and shortness of breath.  Patient says she relates this to the high pollen counts outside.  She has mainly been coughing up clear mucus.  She denies any fever or discolored mucus.  She denies any chest pain orthopnea PND or leg swelling.  She is maintained on Symbicort twice daily.  She takes Allegra, Singulair and Nasonex daily.  Patient was previously on [place] however this was stopped in the fall without significant change in her symptoms. ?Patient says appetite has been okay.  She says since having COVID last year her appetite has not been as good.  And she has had some weight  loss. ? ?Allergies  ?Allergen Reactions  ? Codeine Nausea And Vomiting  ? Ace Inhibitors   ?  Other reaction(s): cough  ? Amlodipine   ?  Other reaction(s): too much edemastopped it around 4/17  ? Egg Phospholipids   ? Eggs Or Egg-Derived Products   ?  UNSPECIFIED REACTION  ?RAW EGGS.. Can eat cooked eggs  ? Ferrous Sulfate   ?  Other reaction(s): hurt stomach.  ? Hydrocodone Nausea And Vomiting  ? Influenza Vaccines   ?  UNSPECIFIED REACTION  ?Egg allergy ?  ? Metoprolol   ?  Other reaction(s): cannot sleep on it and i worry about beta blocker with her asthma  ? Nucala [Mepolizumab]   ?  UNSPECIFIED REACTION   ? Telithromycin   ?  Other reaction(s): N/V possibly  ? Cephalosporins Rash  ? Gabapentin Nausea Only  ? Levofloxacin Rash  ? Pneumococcal Vaccines Rash  ? ? ?Immunization History  ?Administered Date(s) Administered  ? Influenza Split 01/24/2009, 06/03/2011, 08/12/2012, 09/05/2014  ? J[place]sen (J&J) SARS-COV-2 Vaccination 04/19/2020  ? Pneumococcal Conjugate-13 09/14/2012  ? Pneumococcal Polysaccharide-23 01/31/2009, 06/03/2011  ? Td 06/03/2011  ? Tdap 09/07/2018  ? Zoster Recombinat (Shingrix) 04/18/2017, 09/16/2017  ? Zoster, Live 07/30/2007, 06/03/2011  ? ? ?Past Medical History:  ?Diagnosis Date  ? Anemia   ? after hysterectomy  ? Angio-edema   ? Asthma, severe persistent   ? pulmologist-- dr Lynford Citizen  ? Cancer [facility])   ? Breast cancer on right  ?  Chronic kidney disease, stage II (mild)   ? DDD (degenerative disc disease), cervical   ? Diverticulitis   ? Diverticulosis yrs ago  ? hx of   ? Edema, lower extremity   ? occ both legs swell  ? GERD (gastroesophageal reflux disease)   ? Headache   ? sinus headaches  ? Heart murmur   ? no problems - per pt  ? History of breast cancer 1990 left mastectomy also  ? 1989  S/P   RIGHT MASTECTOMY ;  NO CHEMORADIATION //   NO RECURRENCE  ? History of DVT of lower extremity 5 yrs ago  ? right leg  ? History of shingles   ? Hyperlipidemia   ? Internal hemorrhoid   ? Leg  ulcer, left ([place])   ? Osteoporosis, unspecified   ? Knee and hip osteoarthritis bilaterally  ? Perennial allergic rhinitis   ? Peripheral vascular disease ([place])   ? Pneumonia   ? Restless legs syndrome (RLS)   ? Rheumatoid arthritis ([place])   ? hands  ? Thyroid nodule   ? followed by dr perrini yearly, no current problam  ? Unspecified essential hypertension   ? Urticaria   ? ? ?Tobacco History: ?Social History  ? ?Tobacco Use  ?Smoking Status Never  ?Smokeless Tobacco Never  ? ?Counseling given: Not Answered ? ? ?Outpatient Medications Prior to Visit  ?Medication Sig Dispense Refill  ? Abaloparatide (TYMLOS) 3120 MCG/1.56ML SOPN See admin instructions.    ? albuterol (PROVENTIL) (2.5 MG/3ML) 0.083% nebulizer solution Take 3 mLs (2.5 mg total) by nebulization every 6 (six) hours as needed for wheezing or shortness of breath. 75 mL 12  ? budesonide-formoterol (SYMBICORT) 160-4.5 MCG/ACT inhaler 2 puffs po bid (but as of 11/22/11 on a break for two weeks for throat irritation but going to resume it soon)    ? Calcium Carbonate-Vitamin D 600-400 MG-UNIT tablet Take 1 tablet by mouth daily.    ? chlorpheniramine (CHLOR-TRIMETON) 4 MG tablet Take 4-8 mg by mouth See admin instructions. 4 mg in the morning and 8 mg at bedtime    ? Cholecalciferol (VITAMIN D3) 1000 UNITS tablet Take 1,000 Units by mouth every evening.     ? dextromethorphan (DELSYM) 30 MG/5ML liquid Take 60 mg by mouth 2 (two) times daily as needed for cough.    ? docusate sodium (COLACE) 100 MG capsule Take 1 capsule (100 mg total) by mouth 2 (two) times daily. 10 capsule 0  ? feeding supplement (ENSURE ENLIVE / ENSURE PLUS) LIQD Take 272ms by mouth twice a day between meals    ? fexofenadine (ALLEGRA) 180 MG tablet Take 180 mg by mouth daily.      ? furosemide (LASIX) 20 MG tablet TAKE 1 TABLET (20 MG TOTAL) BY MOUTH DAILY AS NEEDED FOR FLUID OR EDEMA. 90 tablet 3  ? mirtazapine (REMERON) 15 MG tablet Take 15 mg by mouth at bedtime.    ? Multiple Vitamin  (MULTIVITAMIN WITH MINERALS) TABS tablet Take 1 tablet by mouth daily. Centrum Silver Multivitamin    ? multivitamin-lutein (OCUVITE-LUTEIN) CAPS capsule Take 1 capsule by mouth daily.    ? mupirocin ointment (BACTROBAN) 2 % as needed.     ? nitroGLYCERIN (NITROSTAT) 0.4 MG SL tablet Place 1 tablet (0.4 mg total) under the tongue every 5 (five) minutes as needed for chest pain. 25 tablet 3  ? pantoprazole (PROTONIX) 40 MG tablet Take 40 mg by mouth daily.    ? potassium chloride (KLOR-CON) 10 MEQ  tablet TAKE 1 TABLET BY MOUTH EVERY DAY 90 tablet 3  ? pramipexole (MIRAPEX) 0.125 MG tablet Take 0.375 mg by mouth at bedtime.    ? rivaroxaban (XARELTO) 10 MG TABS tablet Take 10 mg by mouth daily.    ? rosuvastatin (CRESTOR) 5 MG tablet TAKE 1 TABLET BY MOUTH EVERYDAY AT BEDTIME 90 tablet 3  ? Spacer/Aero-Holding Chambers (AEROCHAMBER MV) inhaler by Other route. Use as instructed     ? telmisartan (MICARDIS) 20 MG tablet Take 20 mg by mouth daily.     ? vitamin B-12 (CYANOCOBALAMIN) 500 MCG tablet Take 500 mcg by mouth daily.      ? albuterol (PROAIR HFA) 108 (90 Base) MCG/ACT inhaler Inhale 2 puffs into the lungs every 6 (six) hours as needed for wheezing or shortness of breath. 18 g 5  ? ipratropium (ATROVENT) 0.06 % nasal spray Place 2 sprays into both nostrils 2 (two) times daily.    ? mometasone (NASONEX) 50 MCG/ACT nasal spray USE 2 SPRAYS NASALLY 2 TIMES DAILY 17 g 11  ? montelukast (SINGULAIR) 10 MG tablet TAKE 1 TABLET BY MOUTH EVERY NIGHT AT BEDTIME 90 tablet 2  ? methocarbamol (ROBAXIN) 500 MG tablet Take 1 tablet (500 mg total) by mouth every 6 (six) hours as needed for muscle spasms. (Patient not taking: Reported on 08/12/2021) 40 tablet 0  ? esomeprazole (NEXIUM) 40 MG capsule Take 40 mg by mouth daily at 12 noon. (Patient not taking: Reported on 08/12/2021)    ? ?Facility-Administered Medications Prior to Visit  ?Medication Dose Route Frequency Provider Last Rate Last Admin  ? albuterol (VENTOLIN HFA) 108 (90  Base) MCG/ACT inhaler 2 puff  2 puff Inhalation Once PRN Causey, Charlestine Massed, NP      ? diphenhydrAMINE (BENADRYL) injection 50 mg  50 mg Intramuscular Once PRN Causey, Charlestine Massed, NP      ? EPINEPHrine

## 2021-08-26 NOTE — Telephone Encounter (Signed)
Spoke with the pt  ?She is c/o increased SOB, wheezing and cough with clear sputum x 2-3 wks  ?She is using her symbicort bid and using albuterol neb about every 6 hours and this helps some but not much  ?She denies any f/c/s, aches  ?She is requesting pred taper  ?Please advise, thanks! ? ? ?Allergies  ?Allergen Reactions  ? Codeine Nausea And Vomiting  ? Ace Inhibitors   ?  Other reaction(s): cough  ? Amlodipine   ?  Other reaction(s): too much edemastopped it around 4/17  ? Egg Phospholipids   ? Eggs Or Egg-Derived Products   ?  UNSPECIFIED REACTION  ?RAW EGGS.. Can eat cooked eggs  ? Ferrous Sulfate   ?  Other reaction(s): hurt stomach.  ? Hydrocodone Nausea And Vomiting  ? Influenza Vaccines   ?  UNSPECIFIED REACTION  ?Egg allergy ?  ? Metoprolol   ?  Other reaction(s): cannot sleep on it and i worry about beta blocker with her asthma  ? Nucala [Mepolizumab]   ?  UNSPECIFIED REACTION   ? Telithromycin   ?  Other reaction(s): N/V possibly  ? Cephalosporins Rash  ? Gabapentin Nausea Only  ? Levofloxacin Rash  ? Pneumococcal Vaccines Rash  ? ? ?

## 2021-09-06 ENCOUNTER — Encounter: Payer: Self-pay | Admitting: Acute Care

## 2021-09-06 ENCOUNTER — Ambulatory Visit: Payer: Medicare Other | Admitting: Acute Care

## 2021-09-06 ENCOUNTER — Ambulatory Visit (INDEPENDENT_AMBULATORY_CARE_PROVIDER_SITE_OTHER): Payer: Medicare Other | Admitting: Internal Medicine

## 2021-09-06 VITALS — BP 116/68 | HR 66 | Temp 98.1°F | Ht 64.75 in | Wt 123.6 lb

## 2021-09-06 DIAGNOSIS — J4550 Severe persistent asthma, uncomplicated: Secondary | ICD-10-CM

## 2021-09-06 LAB — PULMONARY FUNCTION TEST
DL/VA % pred: 88 %
DL/VA: 3.57 ml/min/mmHg/L
DLCO cor % pred: 76 %
DLCO cor: 14.55 ml/min/mmHg
DLCO unc % pred: 76 %
DLCO unc: 14.55 ml/min/mmHg
FEF 25-75 Post: 1.52 L/sec
FEF 25-75 Pre: 1.27 L/sec
FEF2575-%Change-Post: 20 %
FEF2575-%Pred-Post: 133 %
FEF2575-%Pred-Pre: 111 %
FEV1-%Change-Post: 3 %
FEV1-%Pred-Post: 100 %
FEV1-%Pred-Pre: 96 %
FEV1-Post: 1.8 L
FEV1-Pre: 1.74 L
FEV1FVC-%Change-Post: 3 %
FEV1FVC-%Pred-Pre: 101 %
FEV6-%Change-Post: 0 %
FEV6-%Pred-Post: 103 %
FEV6-%Pred-Pre: 103 %
FEV6-Post: 2.36 L
FEV6-Pre: 2.37 L
FEV6FVC-%Pred-Post: 106 %
FEV6FVC-%Pred-Pre: 106 %
FVC-%Change-Post: 0 %
FVC-%Pred-Post: 96 %
FVC-%Pred-Pre: 97 %
FVC-Post: 2.36 L
FVC-Pre: 2.37 L
Post FEV1/FVC ratio: 76 %
Post FEV6/FVC ratio: 100 %
Pre FEV1/FVC ratio: 74 %
Pre FEV6/FVC Ratio: 100 %
RV % pred: 105 %
RV: 2.69 L
TLC % pred: 95 %
TLC: 4.95 L

## 2021-09-06 LAB — CBC WITH DIFFERENTIAL/PLATELET
Basophils Absolute: 0 10*3/uL (ref 0.0–0.1)
Basophils Relative: 0.3 % (ref 0.0–3.0)
Eosinophils Absolute: 0.3 10*3/uL (ref 0.0–0.7)
Eosinophils Relative: 4.9 % (ref 0.0–5.0)
HCT: 38 % (ref 36.0–46.0)
Hemoglobin: 12.4 g/dL (ref 12.0–15.0)
Lymphocytes Relative: 17.8 % (ref 12.0–46.0)
Lymphs Abs: 1.2 10*3/uL (ref 0.7–4.0)
MCHC: 32.6 g/dL (ref 30.0–36.0)
MCV: 89.9 fl (ref 78.0–100.0)
Monocytes Absolute: 0.5 10*3/uL (ref 0.1–1.0)
Monocytes Relative: 7.3 % (ref 3.0–12.0)
Neutro Abs: 4.9 10*3/uL (ref 1.4–7.7)
Neutrophils Relative %: 69.7 % (ref 43.0–77.0)
Platelets: 268 10*3/uL (ref 150.0–400.0)
RBC: 4.23 Mil/uL (ref 3.87–5.11)
RDW: 15.6 % — ABNORMAL HIGH (ref 11.5–15.5)
WBC: 7 10*3/uL (ref 4.0–10.5)

## 2021-09-06 LAB — POCT EXHALED NITRIC OXIDE: FeNO level (ppb): 75

## 2021-09-06 NOTE — Progress Notes (Signed)
PFT done today. 

## 2021-09-06 NOTE — Progress Notes (Signed)
? ?History of Present Illness ?[name] is a 88 y.o. female never smoker with Severe, Persistent Asthma, Chronic Allergic Rhinitis, DVT, & GERD w/ Hiatal Hernia. Covid December 2022 that did not require hospitalization, but weight loss and physical deconditioning.She is followed by Dr. Chase Caller. ? ?Maintenance Regimen ?Xolair, Singulair, Spiriva, and Symbicort ?Xarelto for DVT indefinately ?Nexium for GERD ? ?Off Symbicort between November 2022 and February 2023 ?Off Dupixent between November 2022 and February 2023 ?Despite this overall asthma seems to be under decent control  ? ?09/06/2021 ?Pt.presents for follow up. She was last seen 08/26/2021  by Rexene Edison, NP. She was treated for an asthma flare with a prednisone taper.  At that time she had been taken off her Dupixent and Symbicort by the Hospitalists when they discharged her from the hospital after  fall in November 2022. Her asthma had been under decent control. Dr. Chase Caller has asked her to hold off on Dupixent given her over all stability, and plan is to check PFT's, FENO and CBC with diff to check eosinophils. Pt. Did use her Symbicort prior to her PFT's this morning. She had a flare of her asthma last week and was treated with a prednisone taper. She states she is much better after prednisone. She denies any wheezing since her prednisone. She has started to get her appetite back after her COVID.She lost 20 pounds overall.  She will need assistance with getting Dupixent if this is restarted. She has 3 doses left, but she changed insurance companies and her new carrier will not pay for the [place]. She will need financial aid.  ?We discussed that we may need to restart her Dupixent as she has become more symptomatic with FENO  of 74 ppb again today after a prednisone taper. . She is currently doing well after her taper. She has not had any night time awakenings due to shortness of breath. She has not had any wheezing. She is compliant with her  Symbicort daily. FENO today. She has not had to use her rescue since her prednisone taper.  ? ? ?Test Results: ?PFT ?07/02/13: FVC 2.39 L (90%) FEV1 1.77 L (90%) FEV1/FVC 0.74 FEF 25-75 1.32 L (95%) negative bronchodilator response ?01/16/16: FVC 2.26 L (84%) FEV1 1.54 L (78%) FEV1/FVC 0.68 FEF 25-75 0.92 L (65%) positive bronchodilator response ?10/31/15: FVC 1.88 L (70%) FEV1 1.26 L (63%) FEV1/FVC 0.67 FEF 25-75 0.68 L (43%) negative bronchodilator response ?08/03/15: FVC 2.29 L (85%) FEV1 1.63 L (81%) FEV1/FVC 0.71 FEF 25-75 1.05 L (73%) positive bronchodilator response TLC 5.97 L (115%) RV 143% ERV 35% DLCO corrected 72% (Hgb 12.1) ?10/10/11: FVC 2.21 L (77%) FEV1 1.57 L (74%) FEV1/FVC 0.71 FEF 25-75 1.10 L (64%) ? ? ?  Latest Ref Rng & Units 04/10/2021  ? 11:23 PM 04/06/2021  ?  3:12 AM 04/04/2021  ?  3:23 AM  ?CBC  ?WBC 4.0 - 10.5 K/uL 9.3   5.6   10.1    ?Hemoglobin 12.0 - 15.0 g/dL 11.1   9.6   11.2    ?Hematocrit 36.0 - 46.0 % 33.5   29.8   34.6    ?Platelets 150 - 400 K/uL 452   250   272    ? ? ? ?  Latest Ref Rng & Units 04/10/2021  ? 11:23 PM 04/06/2021  ?  3:12 AM 04/04/2021  ?  3:23 AM  ?BMP  ?Glucose 70 - 99 mg/dL 113   127   146    ?BUN  8 - 23 mg/dL 13   30   32    ?Creatinine 0.44 - 1.00 mg/dL 1.02   1.27   1.50    ?Sodium 135 - 145 mmol/L 128   133   131    ?Potassium 3.5 - 5.1 mmol/L 3.5   3.9   4.5    ?Chloride 98 - 111 mmol/L 91   98   94    ?CO2 22 - 32 mmol/L '24   28   26    '$ ?Calcium 8.9 - 10.3 mg/dL 7.9   8.3   8.2    ? ? ?BNP ?No results found for: BNP ? ?ProBNP ?No results found for: PROBNP ? ?PFT ?   ?Component Value Date/Time  ? FEV1PRE 1.74 09/06/2021 0831  ? FEV1POST 1.80 09/06/2021 0831  ? FVCPRE 2.37 09/06/2021 0831  ? FVCPOST 2.36 09/06/2021 0831  ? TLC 4.95 09/06/2021 0831  ? DLCOUNC 14.55 09/06/2021 0831  ? PREFEV1FVCRT 74 09/06/2021 0831  ? PSTFEV1FVCRT 76 09/06/2021 0831  ? ? ?DG Chest 2 View ? ?Result Date: 08/28/2021 ?CLINICAL DATA:  88 year old female with history of asthma  exacerbation. EXAM: CHEST - 2 VIEW COMPARISON:  Chest x-ray 12/22/2018. FINDINGS: Lung volumes are normal. Mild elevation of the left hemidiaphragm is unchanged. No consolidative airspace disease. No pleural effusions. No pneumothorax. No pulmonary nodule or mass noted. Pulmonary vasculature and the cardiomediastinal silhouette are within normal limits. Atherosclerosis in the thoracic aorta. IMPRESSION: 1.  No radiographic evidence of acute cardiopulmonary disease. 2. Aortic atherosclerosis. Electronically Signed   By: Vinnie Langton M.D.   On: 08/28/2021 10:42   ? ? ?Past medical hx ?Past Medical History:  ?Diagnosis Date  ? Anemia   ? after hysterectomy  ? Angio-edema   ? Asthma, severe persistent   ? pulmologist-- dr Lynford Citizen  ? Cancer [facility])   ? Breast cancer on right  ? Chronic kidney disease, stage II (mild)   ? DDD (degenerative disc disease), cervical   ? Diverticulitis   ? Diverticulosis yrs ago  ? hx of   ? Edema, lower extremity   ? occ both legs swell  ? GERD (gastroesophageal reflux disease)   ? Headache   ? sinus headaches  ? Heart murmur   ? no problems - per pt  ? History of breast cancer 1990 left mastectomy also  ? 1989  S/P   RIGHT MASTECTOMY ;  NO CHEMORADIATION //   NO RECURRENCE  ? History of DVT of lower extremity 5 yrs ago  ? right leg  ? History of shingles   ? Hyperlipidemia   ? Internal hemorrhoid   ? Leg ulcer, left ([place])   ? Osteoporosis, unspecified   ? Knee and hip osteoarthritis bilaterally  ? Perennial allergic rhinitis   ? Peripheral vascular disease ([place])   ? Pneumonia   ? Restless legs syndrome (RLS)   ? Rheumatoid arthritis ([place])   ? hands  ? Thyroid nodule   ? followed by dr perrini yearly, no current problam  ? Unspecified essential hypertension   ? Urticaria   ?  ? ?Social History  ? ?Tobacco Use  ? Smoking status: Never  ? Smokeless tobacco: Never  ?Vaping Use  ? Vaping Use: Never used  ?Substance Use Topics  ? Alcohol use: No  ? Drug use: No  ? ? ?Ms.Wesely reports that  she has never smoked. She has never used smokeless tobacco. She reports that she does not drink alcohol and does not  use drugs. ? ?Tobacco Cessation: ?Never smoker ? ? ?Past surgical hx, Family hx, Social hx all reviewed. ? ?Current Outpatient Medications on File Prior to Visit  ?Medication Sig  ? Abaloparatide (TYMLOS) 3120 MCG/1.56ML SOPN See admin instructions.  ? albuterol (PROAIR HFA) 108 (90 Base) MCG/ACT inhaler Inhale 2 puffs into the lungs every 6 (six) hours as needed for wheezing or shortness of breath.  ? albuterol (PROVENTIL) (2.5 MG/3ML) 0.083% nebulizer solution Take 3 mLs (2.5 mg total) by nebulization every 6 (six) hours as needed for wheezing or shortness of breath.  ? budesonide-formoterol (SYMBICORT) 160-4.5 MCG/ACT inhaler 2 puffs po bid (but as of 11/22/11 on a break for two weeks for throat irritation but going to resume it soon)  ? Calcium Carbonate-Vitamin D 600-400 MG-UNIT tablet Take 1 tablet by mouth daily.  ? chlorpheniramine (CHLOR-TRIMETON) 4 MG tablet Take 4-8 mg by mouth See admin instructions. 4 mg in the morning and 8 mg at bedtime  ? Cholecalciferol (VITAMIN D3) 1000 UNITS tablet Take 1,000 Units by mouth every evening.   ? dextromethorphan (DELSYM) 30 MG/5ML liquid Take 60 mg by mouth 2 (two) times daily as needed for cough.  ? docusate sodium (COLACE) 100 MG capsule Take 1 capsule (100 mg total) by mouth 2 (two) times daily.  ? feeding supplement (ENSURE ENLIVE / ENSURE PLUS) LIQD Take 235ms by mouth twice a day between meals  ? fexofenadine (ALLEGRA) 180 MG tablet Take 180 mg by mouth daily.    ? furosemide (LASIX) 20 MG tablet TAKE 1 TABLET (20 MG TOTAL) BY MOUTH DAILY AS NEEDED FOR FLUID OR EDEMA.  ? ipratropium (ATROVENT) 0.06 % nasal spray Place 2 sprays into both nostrils 2 (two) times daily.  ? methocarbamol (ROBAXIN) 500 MG tablet Take 1 tablet (500 mg total) by mouth every 6 (six) hours as needed for muscle spasms. (Patient not taking: Reported on 08/12/2021)  ?  mirtazapine (REMERON) 15 MG tablet Take 15 mg by mouth at bedtime.  ? mometasone (NASONEX) 50 MCG/ACT nasal spray USE 2 SPRAYS NASALLY 2 TIMES DAILY  ? montelukast (SINGULAIR) 10 MG tablet Take 1 tablet (10 mg t

## 2021-09-06 NOTE — Patient Instructions (Addendum)
It is good to see you today. ?We will get a CBC today to check your Eosinophils  ?Continue on Symbicort 2 puffs Twice daily   ?Albuterol inhaler or neb as needed.  ?Mucinex DM Twice daily  As needed  cough/congestion .  ?Continue on Singulair , Nasonex, and Allegra daily .  ?We will discuss with Dr. Chase Caller about resuming Dupixent after we get the CBC lab results back. ?Follow up in 1 month with Judson Roch NP or Dr. Chase Caller.  ?Call us sooner if you need [place].  ?Please contact office for sooner follow up if symptoms do not improve or worsen or seek emergency care   ? ?

## 2021-09-10 ENCOUNTER — Telehealth: Payer: Self-pay | Admitting: Acute Care

## 2021-09-10 NOTE — Telephone Encounter (Signed)
-----   Message from Magdalen Spatz, NP sent at 09/10/2021  1:38 PM EDT ----- ?Please call patient and let her know her CBC showed that her eosinophils were not elevated on her differential. Let her know we can Talk with MR to determine if he wants to resume her Biologic for asthma. Thanks ? ? ?

## 2021-09-10 NOTE — Telephone Encounter (Signed)
Pt. Had FENO of 75 ppb and eosinophils were not elevated on CBC. Do you want to restart her biologic?  ?

## 2021-09-10 NOTE — Telephone Encounter (Signed)
I called the patient and gave her the results per Judson Roch and she voices understanding.  ?

## 2021-09-19 NOTE — Telephone Encounter (Signed)
Yes if she is more symptomatic and control ppoor based on symptom. FASENRA - better schedule is an option too. Or go back to dupixent ?

## 2021-09-22 ENCOUNTER — Other Ambulatory Visit: Payer: Self-pay | Admitting: Internal Medicine

## 2021-09-22 DIAGNOSIS — J4550 Severe persistent asthma, uncomplicated: Secondary | ICD-10-CM

## 2021-09-23 ENCOUNTER — Telehealth: Payer: Self-pay | Admitting: Pharmacist

## 2021-09-23 ENCOUNTER — Other Ambulatory Visit (HOSPITAL_COMMUNITY): Payer: Self-pay

## 2021-09-23 DIAGNOSIS — J4550 Severe persistent asthma, uncomplicated: Secondary | ICD-10-CM

## 2021-09-23 NOTE — Telephone Encounter (Signed)
Patient is restarting Dupixent. Last dose was in November 2022. ? ?She has three Dupixent doses at home but has been advised not to restart until we get approval through insurance as this is usually the rate-limiting step for patient assistance ? ?Submitted a Prior Authorization request to [facility] for [place] via CoverMyMeds. Will update once we receive a response. ? ?Key: [id number] ? ?Routing to [place] to email patient [place] application to patient to patient via [place]. ? ?Provider portion of pt assistance application placed in Dr. Golden Pop mailbox ? ?Knox Saliva, PharmD, MPH, BCPS, CPP ?Clinical Pharmacist (Rheumatology and Pulmonology) ?

## 2021-09-23 NOTE — Telephone Encounter (Signed)
Did not send refill for Dupixent as patient is restarting and prior authorization is required ? ?Knox Saliva, PharmD, MPH, BCPS, CPP ?Clinical Pharmacist (Rheumatology and Pulmonology) ?

## 2021-09-23 NOTE — Telephone Encounter (Signed)
Received notification from [facility] regarding a prior authorization for [place]. Authorization has been APPROVED from 09/23/21 to 03/26/22.  ? ?Per test claim, copay for 28 days supply is $1161.97 ? ?Authorization # [id number] ?Phone # [phone] ? ?Patient assistance paperwork pending provider signature and patient forms that were sent via [place]. Will place approval letter in PAP pending info folder in pharmacy office. ? ?Knox Saliva, PharmD, MPH, BCPS, CPP ?Clinical Pharmacist (Rheumatology and Pulmonology) ? ?

## 2021-09-24 ENCOUNTER — Other Ambulatory Visit (HOSPITAL_COMMUNITY): Payer: Self-pay

## 2021-09-24 NOTE — Telephone Encounter (Signed)
Spoke with Dr. Chase Caller. He would like patient on a lower-dose of '200mg'$  q 14 days. She will not be able to use her Dupixent supply at home if it is '300mg'$  strength ? ?Knox Saliva, PharmD, MPH, BCPS, CPP ?Clinical Pharmacist (Rheumatology and Pulmonology) ?

## 2021-09-24 NOTE — Telephone Encounter (Signed)
Pt portion received, awaiting return of provider portion. ?

## 2021-09-24 NOTE — Telephone Encounter (Signed)
Submitted Patient Assistance Application to [place] for [place] along with provider portion, patient portion, PA, med list, and insurance card copy. Will update patient when we receive a response. ? ?Fax# [phone] ?Phone# [phone], option 1  ?

## 2021-10-08 ENCOUNTER — Encounter: Payer: Self-pay | Admitting: Acute Care

## 2021-10-08 ENCOUNTER — Ambulatory Visit: Payer: Medicare Other | Admitting: Acute Care

## 2021-10-08 VITALS — BP 132/62 | HR 94 | Temp 97.8°F | Ht 64.5 in | Wt 124.4 lb

## 2021-10-08 DIAGNOSIS — R062 Wheezing: Secondary | ICD-10-CM | POA: Diagnosis not present

## 2021-10-08 DIAGNOSIS — J4541 Moderate persistent asthma with (acute) exacerbation: Secondary | ICD-10-CM | POA: Diagnosis not present

## 2021-10-08 MED ORDER — PREDNISONE 10 MG PO TABS: ORAL_TABLET | ORAL | 0 refills | Status: DC

## 2021-10-08 MED ORDER — ALBUTEROL SULFATE (2.5 MG/3ML) 0.083% IN NEBU
2.5000 mg | INHALATION_SOLUTION | Freq: Four times a day (QID) | RESPIRATORY_TRACT | Status: AC | PRN
  Administered 2021-10-08: 2.5 mg via RESPIRATORY_TRACT

## 2021-10-08 MED ORDER — METHYLPREDNISOLONE ACETATE 80 MG/ML IJ SUSP
80.0000 mg | Freq: Once | INTRAMUSCULAR | Status: AC
  Administered 2021-10-08: 80 mg via INTRAMUSCULAR

## 2021-10-08 NOTE — Patient Instructions (Addendum)
It is good to see you today. We will do an albuterol neb now as you are so wheezy. We will give you a DepoMedrol injection today.  We will treat you with a prednisone taper. Start prednisone tablets tomorrow  Prednisone taper; 10 mg tablets: 4 tabs x 2 days, 3 tabs x 2 days, 2 tabs x 2 days 1 tab x 2 days then stop. Return in 1 week to be reassessed. Call us sooner if you need [place] or seek emergency care.  Pharmacy will be in touch about when financial assistance for Dupixent is received, and first injection will be planned at that time.  Please contact office for sooner follow up if symptoms do not improve or worsen or seek emergency care

## 2021-10-08 NOTE — Progress Notes (Signed)
History of Present Illness [name] is a 88 y.o. female never smoker with Severe, Persistent Asthma, Chronic Allergic Rhinitis, DVT, & GERD w/ Hiatal Hernia. Covid December 2022 that did not require hospitalization, but weight loss and physical deconditioning.She is followed by Dr. Chase Caller.  Maintenance Regimen Xolair, Singulair, and Symbicort Xarelto for DVT indefinately Nexium for GERD  Off Symbicort between November 2022 and February 2023 Off Dupixent between November 2022 and February 2023  10/08/2021 Pt. Presents for follow up. She was last seen 09/06/2021 for a slow to resolve asthma. She has been having worsening shortness of breath over the last 2 weeks . She is using her Symbicort, and her rescue nebs about 3 times daily.She took her Symbicort this morning, but has not used a neb. She is very wheezy this morning. She is also coughing, she states secretions are clear. She denies any fever. We will do a neb in the office today.  She had been taken off her Dupixent  by the Hospitalists when they discharged her from the hospital after  fall in November 2022 when she had Covid.  She had been using her Symbicort and Spiriva, with continued flares.  After speaking with Dr. Chase Caller, plan is for resumption of biologic therapy with  Dupixent at a lower dose of '200mg'$  q 14 days. She continues to have improvement of her appetite. She has gained another pound in the last month. Weight is up to  124 now. Paperwork has been completed by pharmacy for financial assistance. We are awaiting approval for financial assistance. Until then we will treat for a flare again today. Pt. Had brought her 300 mg Dupixent injection to the office today. She was told by pharmacy to bring this. Patient thought she was going to get an injection today. I explained that there is no appointment for a Dupixent injection today. We reached out to pharmacy to confirm this. They are unsure where the communication broke down  with the patient. They stated they will be in touch with the patient when they had drug to administer. Until then, we are treating for an additional flare with prednisone starting tomorrow,  and a DepoMedrol  shot today.   Test Results: 07/02/13: FVC 2.39 L (90%) FEV1 1.77 L (90%) FEV1/FVC 0.74 FEF 25-75 1.32 L (95%) negative bronchodilator response 01/16/16: FVC 2.26 L (84%) FEV1 1.54 L (78%) FEV1/FVC 0.68 FEF 25-75 0.92 L (65%) positive bronchodilator response 10/31/15: FVC 1.88 L (70%) FEV1 1.26 L (63%) FEV1/FVC 0.67 FEF 25-75 0.68 L (43%) negative bronchodilator response 08/03/15: FVC 2.29 L (85%) FEV1 1.63 L (81%) FEV1/FVC 0.71 FEF 25-75 1.05 L (73%) positive bronchodilator response TLC 5.97 L (115%) RV 143% ERV 35% DLCO corrected 72% (Hgb 12.1) 10/10/11: FVC 2.21 L (77%) FEV1 1.57 L (74%) FEV1/FVC 0.71 FEF 25-75 1.10 L (64%)     Latest Ref Rng & Units 09/06/2021   10:34 AM 04/10/2021   11:23 PM 04/06/2021    3:12 AM  CBC  WBC 4.0 - 10.5 K/uL 7.0   9.3   5.6    Hemoglobin 12.0 - 15.0 g/dL 12.4   11.1   9.6    Hematocrit 36.0 - 46.0 % 38.0   33.5   29.8    Platelets 150.0 - 400.0 K/uL 268.0   452   250         Latest Ref Rng & Units 04/10/2021   11:23 PM 04/06/2021    3:12 AM 04/04/2021    3:23 AM  BMP  Glucose 70 - 99 mg/dL 113   127   146    BUN 8 - 23 mg/dL 13   30   32    Creatinine 0.44 - 1.00 mg/dL 1.02   1.27   1.50    Sodium 135 - 145 mmol/L 128   133   131    Potassium 3.5 - 5.1 mmol/L 3.5   3.9   4.5    Chloride 98 - 111 mmol/L 91   98   94    CO2 22 - 32 mmol/L '24   28   26    '$ Calcium 8.9 - 10.3 mg/dL 7.9   8.3   8.2      BNP No results found for: BNP  ProBNP No results found for: PROBNP  PFT    Component Value Date/Time   FEV1PRE 1.74 09/06/2021 0831   FEV1POST 1.80 09/06/2021 0831   FVCPRE 2.37 09/06/2021 0831   FVCPOST 2.36 09/06/2021 0831   TLC 4.95 09/06/2021 0831   DLCOUNC 14.55 09/06/2021 0831   PREFEV1FVCRT 74 09/06/2021 0831   PSTFEV1FVCRT  76 09/06/2021 0831    No results found.   Past medical hx Past Medical History:  Diagnosis Date   Anemia    after hysterectomy   Angio-edema    Asthma, severe persistent    pulmologist-- dr Lynford Citizen   Cancer [facility])    Breast cancer on right   Chronic kidney disease, stage II (mild)    DDD (degenerative disc disease), cervical    Diverticulitis    Diverticulosis yrs ago   hx of    Edema, lower extremity    occ both legs swell   GERD (gastroesophageal reflux disease)    Headache    sinus headaches   Heart murmur    no problems - per pt   History of breast cancer 1990 left mastectomy also   1989  S/P   RIGHT MASTECTOMY ;  NO CHEMORADIATION //   NO RECURRENCE   History of DVT of lower extremity 5 yrs ago   right leg   History of shingles    Hyperlipidemia    Internal hemorrhoid    Leg ulcer, left (HCC)    Osteoporosis, unspecified    Knee and hip osteoarthritis bilaterally   Perennial allergic rhinitis    Peripheral vascular disease (HCC)    Pneumonia    Restless legs syndrome (RLS)    Rheumatoid arthritis (HCC)    hands   Thyroid nodule    followed by dr perrini yearly, no current problam   Unspecified essential hypertension    Urticaria      Social History   Tobacco Use   Smoking status: Never   Smokeless tobacco: Never  Vaping Use   Vaping Use: Never used  Substance Use Topics   Alcohol use: No   Drug use: No    Ms.Groninger reports that she has never smoked. She has never used smokeless tobacco. She reports that she does not drink alcohol and does not use drugs.  Tobacco Cessation: Never smoker   Past surgical hx, Family hx, Social hx all reviewed.  Current Outpatient Medications on File Prior to Visit  Medication Sig   Abaloparatide (TYMLOS) 3120 MCG/1.56ML SOPN See admin instructions.   albuterol (PROAIR HFA) 108 (90 Base) MCG/ACT inhaler Inhale 2 puffs into the lungs every 6 (six) hours as needed for wheezing or shortness of breath.    albuterol (PROVENTIL) (2.5 MG/3ML) 0.083% nebulizer solution Take 3  mLs (2.5 mg total) by nebulization every 6 (six) hours as needed for wheezing or shortness of breath.   budesonide-formoterol (SYMBICORT) 160-4.5 MCG/ACT inhaler 2 puffs po bid (but as of 11/22/11 on a break for two weeks for throat irritation but going to resume it soon)   Calcium Carbonate-Vitamin D 600-400 MG-UNIT tablet Take 1 tablet by mouth daily.   chlorpheniramine (CHLOR-TRIMETON) 4 MG tablet Take 4-8 mg by mouth See admin instructions. 4 mg in the morning and 8 mg at bedtime   Cholecalciferol (VITAMIN D3) 1000 UNITS tablet Take 1,000 Units by mouth every evening.    dextromethorphan (DELSYM) 30 MG/5ML liquid Take 60 mg by mouth 2 (two) times daily as needed for cough.   docusate sodium (COLACE) 100 MG capsule Take 1 capsule (100 mg total) by mouth 2 (two) times daily.   feeding supplement (ENSURE ENLIVE / ENSURE PLUS) LIQD Take 212ms by mouth twice a day between meals   fexofenadine (ALLEGRA) 180 MG tablet Take 180 mg by mouth daily.     furosemide (LASIX) 20 MG tablet TAKE 1 TABLET (20 MG TOTAL) BY MOUTH DAILY AS NEEDED FOR FLUID OR EDEMA.   ipratropium (ATROVENT) 0.06 % nasal spray Place 2 sprays into both nostrils 2 (two) times daily.   methocarbamol (ROBAXIN) 500 MG tablet Take 1 tablet (500 mg total) by mouth every 6 (six) hours as needed for muscle spasms.   mirtazapine (REMERON) 15 MG tablet Take 15 mg by mouth at bedtime.   mometasone (NASONEX) 50 MCG/ACT nasal spray USE 2 SPRAYS NASALLY 2 TIMES DAILY   montelukast (SINGULAIR) 10 MG tablet Take 1 tablet (10 mg total) by mouth at bedtime.   Multiple Vitamin (MULTIVITAMIN WITH MINERALS) TABS tablet Take 1 tablet by mouth daily. Centrum Silver Multivitamin   multivitamin-lutein (OCUVITE-LUTEIN) CAPS capsule Take 1 capsule by mouth daily.   mupirocin ointment (BACTROBAN) 2 % as needed.    nitroGLYCERIN (NITROSTAT) 0.4 MG SL tablet Place 1 tablet (0.4 mg total) under  the tongue every 5 (five) minutes as needed for chest pain.   pantoprazole (PROTONIX) 40 MG tablet Take 40 mg by mouth daily.   potassium chloride (KLOR-CON) 10 MEQ tablet TAKE 1 TABLET BY MOUTH EVERY DAY   pramipexole (MIRAPEX) 0.125 MG tablet Take 0.375 mg by mouth at bedtime.   rivaroxaban (XARELTO) 10 MG TABS tablet Take 10 mg by mouth daily.   rosuvastatin (CRESTOR) 5 MG tablet TAKE 1 TABLET BY MOUTH EVERYDAY AT BEDTIME   Spacer/Aero-Holding Chambers (AEROCHAMBER MV) inhaler by Other route. Use as instructed    telmisartan (MICARDIS) 20 MG tablet Take 20 mg by mouth daily.    vitamin B-12 (CYANOCOBALAMIN) 500 MCG tablet Take 500 mcg by mouth daily.     Current Facility-Administered Medications on File Prior to Visit  Medication   albuterol (VENTOLIN HFA) 108 (90 Base) MCG/ACT inhaler 2 puff   diphenhydrAMINE (BENADRYL) injection 50 mg   EPINEPHrine (EPI-PEN) injection 0.3 mg     Allergies  Allergen Reactions   Codeine Nausea And Vomiting   Ace Inhibitors     Other reaction(s): cough   Amlodipine     Other reaction(s): too much edemastopped it around 4/17   Egg Phospholipids    Eggs Or Egg-Derived Products     UNSPECIFIED REACTION  RAW EGGS.. Can eat cooked eggs   Ferrous Sulfate     Other reaction(s): hurt stomach.   Hydrocodone Nausea And Vomiting   Influenza Vaccines     UNSPECIFIED REACTION  Egg allergy    Metoprolol     Other reaction(s): cannot sleep on it and i worry about beta blocker with her asthma   Nucala [Mepolizumab]     UNSPECIFIED REACTION    Telithromycin     Other reaction(s): N/V possibly   Cephalosporins Rash   Gabapentin Nausea Only   Levofloxacin Rash   Pneumococcal Vaccines Rash    Review Of Systems:  Constitutional:   No  weight loss, night sweats,  Fevers, chills, + fatigue, or  lassitude.  HEENT:   No headaches,  Difficulty swallowing,  Tooth/dental problems, or  Sore throat,                No sneezing, itching, ear ache, nasal  congestion, post nasal drip,   CV:  No chest pain,  Orthopnea, PND, swelling in lower extremities, anasarca, dizziness, palpitations, syncope.   GI  No heartburn, indigestion, abdominal pain, nausea, vomiting, diarrhea, change in bowel habits, loss of appetite, bloody stools.   Resp: + shortness of breath with exertion less at rest.  + excess mucus, no productive cough,  + non-productive cough,  No coughing up of blood.  No change in color of mucus.  ++ wheezing.  No chest wall deformity  Skin: no rash or lesions.  GU: no dysuria, change in color of urine, no urgency or frequency.  No flank pain, no hematuria   MS:  No joint pain or swelling.  No decreased range of motion.  No back pain.  Psych:  No change in mood or affect. No depression or anxiety.  No memory loss.   Vital Signs BP 132/62 (BP Location: Left Arm, Cuff Size: Normal)   Pulse 94   Temp 97.8 F (36.6 C) (Temporal)   Ht 5' 4.5" (1.638 m)   Wt 124 lb 6.4 oz (56.4 kg)   SpO2 93%   BMI 21.02 kg/m    Physical Exam:  General- No distress,  A&Ox3, pleasant , but clearly is not feeling well ENT: No sinus tenderness, TM clear, pale nasal mucosa, no oral exudate,no post nasal drip, no LAN Cardiac: S1, S2, regular rate and rhythm, no murmur Chest: ++ wheeze/ No rales/ dullness; no accessory muscle use, no nasal flaring, no sternal retractions Abd.: Soft Non-tender, ND, BS +, Body mass index is 21.02 kg/m.  Ext: No clubbing cyanosis, edema Neuro:  normal strength, MAE x 4, A&O x 3 Skin: No rashes, warm and dry, no lesions  Psych: normal mood and behavior   Assessment/Plan Slow to resolve asthma exacerbation Frequent exacerbations after Dupixent was discontinued 03/2021 Plan We will do an albuterol neb now as you are so wheezy. We will give you a DepoMedrol injection today. ( 80 mg IM) We will treat you with a prednisone taper. Start prednisone tablets tomorrow  Prednisone taper; 10 mg tablets: 4 tabs x 2 days, 3  tabs x 2 days, 2 tabs x 2 days 1 tab x 2 days then stop. Return in 1 week to be reassessed. Call us sooner if you need [place] or seek emergency care.  Pharmacy will be in touch about when financial assistance for Dupixent is received, and first injection will be planned at that time.  Please contact office for sooner follow up if symptoms do not improve or worsen or seek emergency care      I spent 45 minutes dedicated to the care of this patient on the date of this encounter to include pre-visit review of records, face-to-face time with  the patient discussing conditions above, post visit ordering of testing, clinical documentation with the electronic health record, making appropriate referrals as documented, and communicating necessary information to the patient's healthcare team.   Magdalen Spatz, NP 10/08/2021  4:56 PM

## 2021-10-14 NOTE — Telephone Encounter (Signed)
Called [employer], enrollment form has been received, no missing documents needed. Patient just needs to express financial hardship to initiate patient assistance need.  Called patient and discussed. She was confused, because she got a call from [facility] that they would mail for $130 (inaccurate price quote) and mail to the office. No rx has been sent to that pharmacy.  Conference called [employer] with patient and she provided her verbal request for patient assistance. They will now process her application. Advised patient we will provide update once we receive a response.  Phone# [phone], option 1

## 2021-10-16 ENCOUNTER — Ambulatory Visit: Payer: Medicare Other | Admitting: Adult Health

## 2021-10-16 ENCOUNTER — Telehealth: Payer: Self-pay | Admitting: *Deleted

## 2021-10-16 ENCOUNTER — Encounter: Payer: Self-pay | Admitting: Adult Health

## 2021-10-16 VITALS — BP 160/80 | HR 86 | Temp 97.8°F | Ht 64.5 in | Wt 124.0 lb

## 2021-10-16 DIAGNOSIS — I10 Essential (primary) hypertension: Secondary | ICD-10-CM

## 2021-10-16 DIAGNOSIS — J4541 Moderate persistent asthma with (acute) exacerbation: Secondary | ICD-10-CM | POA: Diagnosis not present

## 2021-10-16 DIAGNOSIS — J4550 Severe persistent asthma, uncomplicated: Secondary | ICD-10-CM | POA: Diagnosis not present

## 2021-10-16 LAB — POCT EXHALED NITRIC OXIDE: FeNO level (ppb): 59

## 2021-10-16 MED ORDER — DUPIXENT 200 MG/1.14ML ~~LOC~~ SOSY: 200.0000 mg | PREFILLED_SYRINGE | SUBCUTANEOUS | 1 refills | Status: DC

## 2021-10-16 NOTE — Telephone Encounter (Signed)
Patient gave injection of dupixent 200 mg in office today and stayed 30 minutes after injection and had no adverse reaction.

## 2021-10-16 NOTE — Telephone Encounter (Signed)
Received a Dupixent sample 200 mg from pharmacy as patient is awaiting PA.  She used the syringe and was able to do it easily and states it would be easier to do the syringe than the pen if we could order the syringe when it is approved.  Advised I would make the pharmacy aware.  Pharmacy, Can we order the syringe for the [place] when the PA gets approved?  She states it is easier for her and her husband to do.   Thank you.

## 2021-10-16 NOTE — Telephone Encounter (Signed)
Rx for Dupixent '200mg'$  SQ PFS sent to [facility]. Still awaiting determination  letter from [place] pt assistance  Knox Saliva, PharmD, MPH, BCPS, CPP Clinical Pharmacist (Rheumatology and Pulmonology)

## 2021-10-16 NOTE — Progress Notes (Signed)
$'@Patient'Z$  ID: [name], female    DOB: November 11, 1932, 88 y.o.   MRN: [id number]  Chief Complaint  Patient presents with   Follow-up    Referring provider: Crist Infante, MD  HPI: 88 year old female followed for severe persistent asthma with allergic phenotype Medical history significant for recurrent DVT on chronic anticoagulation with Xarelto Hip Fracture s/p replacement and Covid 19 November/December 2022   TEST/EVENTS :  CT chest November2015 in March 2016: No reports of emphysema seen on CT chest or interstitial lung disease   FeNO in our oifice is 47ppb and ELEVATED  FENO  of 74 ppb   Eosinophils September 06, 2021 absolute count 300   TEST  PFT 07/02/13: FVC 2.39 L (90%) FEV1 1.77 L (90%) FEV1/FVC 0.74 FEF 25-75 1.32 L (95%) negative bronchodilator response 01/16/16: FVC 2.26 L (84%) FEV1 1.54 L (78%) FEV1/FVC 0.68 FEF 25-75 0.92 L (65%) positive bronchodilator response 10/31/15: FVC 1.88 L (70%) FEV1 1.26 L (63%) FEV1/FVC 0.67 FEF 25-75 0.68 L (43%) negative bronchodilator response 08/03/15: FVC 2.29 L (85%) FEV1 1.63 L (81%) FEV1/FVC 0.71 FEF 25-75 1.05 L (73%) positive bronchodilator response TLC 5.97 L (115%) RV 143% ERV 35% DLCO corrected 72% (Hgb 12.1) 10/10/11: FVC 2.21 L (77%) FEV1 1.57 L (74%) FEV1/FVC 0.71 FEF 25-75 1.10 L (64%)  PFTs September 06, 2021 showed FEV1 100%, ratio 76, FVC 96%, no significant bronchodilator response, DLCO 76%.  10/16/2021 Follow up : Asthma  Patient returns for a 1 week follow-up.  Patient's been having difficulty with slow to resolve asthmatic bronchitic exacerbation.  Patient has underlying severe persistent asthma with allergic phenotype.  Previously had been on Symbicort and Dupixent.  Dupixent was stopped in the fall 2022.  She has had multiple flares over the last 6 weeks.  Received steroids on several occasions due to increased cough wheezing and shortness of breath.  She underwent pulmonary function testing that showed normal lung  function with no airflow obstruction or restriction.  Lab work showed eosinophils slightly elevated with an absolute count at 300.  Exhaled nitric oxide testing was elevated last visit at 74 ppb.  Chest x-ray in April 2023 showed no acute process. Patient has recently been restarted back on Dupixent at lower dose, has not started yet due to insurance. She has previous doses of Dupixent at home but '300mg'$  doses.   She remains on Symbicort twice daily and Singulair and allegra daily. Feno today is improving at 59  ppb.   Since last office she is feeling better after finishing another steroid pack . Decreased cough and wheezing .        Allergies  Allergen Reactions   Codeine Nausea And Vomiting   Ace Inhibitors     Other reaction(s): cough   Amlodipine     Other reaction(s): too much edemastopped it around 4/17   Egg Phospholipids    Eggs Or Egg-Derived Products     UNSPECIFIED REACTION  RAW EGGS.. Can eat cooked eggs   Ferrous Sulfate     Other reaction(s): hurt stomach.   Hydrocodone Nausea And Vomiting   Influenza Vaccines     UNSPECIFIED REACTION  Egg allergy    Metoprolol     Other reaction(s): cannot sleep on it and i worry about beta blocker with her asthma   Nucala [Mepolizumab]     UNSPECIFIED REACTION    Telithromycin     Other reaction(s): N/V possibly   Cephalosporins Rash   Gabapentin Nausea Only   Levofloxacin Rash  Pneumococcal Vaccines Rash    Immunization History  Administered Date(s) Administered   Influenza Split 01/24/2009, 06/03/2011, 08/12/2012, 09/05/2014   Janssen (J&J) SARS-COV-2 Vaccination 04/19/2020   Pneumococcal Conjugate-13 09/14/2012   Pneumococcal Polysaccharide-23 01/31/2009, 06/03/2011   Td 06/03/2011   Tdap 09/07/2018   Zoster Recombinat (Shingrix) 04/18/2017, 09/16/2017   Zoster, Live 07/30/2007, 06/03/2011    Past Medical History:  Diagnosis Date   Anemia    after hysterectomy   Angio-edema    Asthma, severe persistent     pulmologist-- dr Lynford Citizen   Cancer [facility])    Breast cancer on right   Chronic kidney disease, stage II (mild)    DDD (degenerative disc disease), cervical    Diverticulitis    Diverticulosis yrs ago   hx of    Edema, lower extremity    occ both legs swell   GERD (gastroesophageal reflux disease)    Headache    sinus headaches   Heart murmur    no problems - per pt   History of breast cancer 1990 left mastectomy also   1989  S/P   RIGHT MASTECTOMY ;  NO CHEMORADIATION //   NO RECURRENCE   History of DVT of lower extremity 5 yrs ago   right leg   History of shingles    Hyperlipidemia    Internal hemorrhoid    Leg ulcer, left (HCC)    Osteoporosis, unspecified    Knee and hip osteoarthritis bilaterally   Perennial allergic rhinitis    Peripheral vascular disease (HCC)    Pneumonia    Restless legs syndrome (RLS)    Rheumatoid arthritis ([place])    hands   Thyroid nodule    followed by dr perrini yearly, no current problam   Unspecified essential hypertension    Urticaria     Tobacco History: Social History   Tobacco Use  Smoking Status Never  Smokeless Tobacco Never   Counseling given: Not Answered   Outpatient Medications Prior to Visit  Medication Sig Dispense Refill   Abaloparatide (TYMLOS) 3120 MCG/1.56ML SOPN See admin instructions.     albuterol (PROAIR HFA) 108 (90 Base) MCG/ACT inhaler Inhale 2 puffs into the lungs every 6 (six) hours as needed for wheezing or shortness of breath. 18 g 5   albuterol (PROVENTIL) (2.5 MG/3ML) 0.083% nebulizer solution Take 3 mLs (2.5 mg total) by nebulization every 6 (six) hours as needed for wheezing or shortness of breath. 75 mL 12   budesonide-formoterol (SYMBICORT) 160-4.5 MCG/ACT inhaler 2 puffs po bid (but as of 11/22/11 on a break for two weeks for throat irritation but going to resume it soon)     chlorpheniramine (CHLOR-TRIMETON) 4 MG tablet Take 4-8 mg by mouth See admin instructions. 4 mg in the morning and 8 mg at  bedtime     Cholecalciferol (VITAMIN D3) 1000 UNITS tablet Take 1,000 Units by mouth every evening.      dextromethorphan (DELSYM) 30 MG/5ML liquid Take 60 mg by mouth 2 (two) times daily as needed for cough.     docusate sodium (COLACE) 100 MG capsule Take 1 capsule (100 mg total) by mouth 2 (two) times daily. 10 capsule 0   feeding supplement (ENSURE ENLIVE / ENSURE PLUS) LIQD Take 232ms by mouth twice a day between meals     fexofenadine (ALLEGRA) 180 MG tablet Take 180 mg by mouth daily.       furosemide (LASIX) 20 MG tablet TAKE 1 TABLET (20 MG TOTAL) BY MOUTH DAILY AS NEEDED FOR  FLUID OR EDEMA. 90 tablet 3   ipratropium (ATROVENT) 0.06 % nasal spray Place 2 sprays into both nostrils 2 (two) times daily. 15 mL 6   methocarbamol (ROBAXIN) 500 MG tablet Take 1 tablet (500 mg total) by mouth every 6 (six) hours as needed for muscle spasms. 40 tablet 0   mirtazapine (REMERON) 15 MG tablet Take 15 mg by mouth at bedtime.     mometasone (NASONEX) 50 MCG/ACT nasal spray USE 2 SPRAYS NASALLY 2 TIMES DAILY 17 g 11   montelukast (SINGULAIR) 10 MG tablet Take 1 tablet (10 mg total) by mouth at bedtime. 90 tablet 2   Multiple Vitamin (MULTIVITAMIN WITH MINERALS) TABS tablet Take 1 tablet by mouth daily. Centrum Silver Multivitamin     multivitamin-lutein (OCUVITE-LUTEIN) CAPS capsule Take 1 capsule by mouth daily.     mupirocin ointment (BACTROBAN) 2 % as needed.      nitroGLYCERIN (NITROSTAT) 0.4 MG SL tablet Place 1 tablet (0.4 mg total) under the tongue every 5 (five) minutes as needed for chest pain. 25 tablet 3   pantoprazole (PROTONIX) 40 MG tablet Take 40 mg by mouth daily.     potassium chloride (KLOR-CON) 10 MEQ tablet TAKE 1 TABLET BY MOUTH EVERY DAY 90 tablet 3   pramipexole (MIRAPEX) 0.125 MG tablet Take 0.375 mg by mouth at bedtime.     predniSONE (DELTASONE) 10 MG tablet 4 tabs for 2 days, then 3 tabs for 2 days, 2 tabs for 2 days, then 1 tab for 2 days, then stop 20 tablet 0    rivaroxaban (XARELTO) 10 MG TABS tablet Take 10 mg by mouth daily.     rosuvastatin (CRESTOR) 5 MG tablet TAKE 1 TABLET BY MOUTH EVERYDAY AT BEDTIME 90 tablet 3   Spacer/Aero-Holding Chambers (AEROCHAMBER MV) inhaler by Other route. Use as instructed      telmisartan (MICARDIS) 20 MG tablet Take 20 mg by mouth daily.      vitamin B-12 (CYANOCOBALAMIN) 500 MCG tablet Take 500 mcg by mouth daily.       Calcium Carbonate-Vitamin D 600-400 MG-UNIT tablet Take 1 tablet by mouth daily. (Patient not taking: Reported on 10/16/2021)     Facility-Administered Medications Prior to Visit  Medication Dose Route Frequency Provider Last Rate Last Admin   albuterol (PROVENTIL) (2.5 MG/3ML) 0.083% nebulizer solution 2.5 mg  2.5 mg Nebulization Q6H PRN Magdalen Spatz, NP   2.5 mg at 10/08/21 1610   albuterol (VENTOLIN HFA) 108 (90 Base) MCG/ACT inhaler 2 puff  2 puff Inhalation Once PRN Gardenia Phlegm, NP       diphenhydrAMINE (BENADRYL) injection 50 mg  50 mg Intramuscular Once PRN Causey, Charlestine Massed, NP       EPINEPHrine (EPI-PEN) injection 0.3 mg  0.3 mg Intramuscular Once PRN Gardenia Phlegm, NP         Review of Systems:   Constitutional:   No  weight loss, night sweats,  Fevers, chills, fatigue, or  lassitude.  HEENT:   No headaches,  Difficulty swallowing,  Tooth/dental problems, or  Sore throat,                No sneezing, itching, ear ache,  +nasal congestion, post nasal drip,   CV:  No chest pain,  Orthopnea, PND, swelling in lower extremities, anasarca, dizziness, palpitations, syncope.   GI  No heartburn, indigestion, abdominal pain, nausea, vomiting, diarrhea, change in bowel habits, loss of appetite, bloody stools.   Resp:   No chest wall  deformity  Skin: no rash or lesions.  GU: no dysuria, change in color of urine, no urgency or frequency.  No flank pain, no hematuria   MS:  No joint pain or swelling.  No decreased range of motion.  No back pain.    Physical  Exam  BP (!) 160/80 (BP Location: Left Arm, Patient Position: Sitting, Cuff Size: Normal)   Pulse 86   Temp 97.8 F (36.6 C) (Oral)   Ht 5' 4.5" (1.638 m)   Wt 124 lb (56.2 kg)   SpO2 100%   BMI 20.96 kg/m   GEN: A/Ox3; pleasant , NAD, well nourished    HEENT:  [place]/AT,  NOSE-clear, THROAT-clear, no lesions, no postnasal drip or exudate noted.   NECK:  Supple w/ fair ROM; no JVD; normal carotid impulses w/o bruits; no thyromegaly or nodules palpated; no lymphadenopathy.    RESP  Clear  P & A; w/o, wheezes/ rales/ or rhonchi. no accessory muscle use, no dullness to percussion  CARD:  RRR, no m/r/g, no peripheral edema, pulses intact, no cyanosis or clubbing.  GI:   Soft & nt; nml bowel sounds; no organomegaly or masses detected.   Musco: Warm bil, no deformities or joint swelling noted.   Neuro: alert, no focal deficits noted.    Skin: Warm, no lesions or rashes    BNP No results found for: BNP  ProBNP No results found for: PROBNP  Imaging: No results found.      Latest Ref Rng & Units 09/06/2021    8:31 AM 07/02/2016   10:21 AM 01/16/2016    9:29 AM 10/31/2015    8:31 AM 08/03/2015    9:43 AM  PFT Results  FVC-Pre L 2.37   2.39   2.26   1.88   2.29    FVC-Predicted Pre % 97   90   84   70   85    FVC-Post L 2.36   2.51   2.57   2.05   2.53    FVC-Predicted Post % 96   95   96   76   94    Pre FEV1/FVC % % 74   74   68   67   71    Post FEV1/FCV % % 76   77   76   70   72    FEV1-Pre L 1.74   1.77   1.54   1.26   1.63    FEV1-Predicted Pre % 96   90   78   63   81    FEV1-Post L 1.80   1.93   1.95   1.44   1.82    DLCO uncorrected ml/min/mmHg 14.55      17.59    DLCO UNC% % 76      69    DLCO corrected ml/min/mmHg 14.55      18.36    DLCO COR %Predicted % 76      72    DLVA Predicted % 88      97    TLC L 4.95      5.97    TLC % Predicted % 95      115    RV % Predicted % 105      143      Lab Results  Component Value Date   NITRICOXIDE 24 07/27/2018         Assessment & Plan:   Severe persistent asthma Severe persistent asthma  with allergic phenotype.  Patient's been having recurrent exacerbations since Dupixent was stopped November 2022 at discharge.  It is unclear why Dupixent was stopped at this time.  Patient has had multiple flareups and received several courses of steroids.  She is clinically improved from recent exacerbation.  She is been recommended to start back on Dupixent but at lower dose at 200 mg.  Patient's insurance has not approved Dupixent at this time.  She is completed paperwork for financial assistance.  Today in the office she will be given a sample dose.  And await insurance approval and financial assistance approval.  Patient is to follow back up in our office in 6 weeks and as needed Continue on current maintenance regimen and trigger prevention  Plan  Patient Instructions  Begin Dupixent when approved, will check on sample start.  Continue on Symbicort 2 puffs Twice daily, rinse after use.  Albuterol inhaler or neb as needed.  Mucinex DM Twice daily  As needed  cough/congestion .  Continue on Singulair , Nasonex, and Allegra daily .  Follow up with Dr. Chase Caller in 6-8 weeks and As needed   Please contact office for sooner follow up if symptoms do not improve or worsen or seek emergency care         Essential hypertension Patient's blood pressure is elevated today in office.  She denies any neurological symptoms.  She is follow with primary care as discussed     Rexene Edison, NP 10/16/2021

## 2021-10-16 NOTE — Patient Instructions (Signed)
Begin Dupixent when approved, will check on sample start.  Continue on Symbicort 2 puffs Twice daily, rinse after use.  Albuterol inhaler or neb as needed.  Mucinex DM Twice daily  As needed  cough/congestion .  Continue on Singulair , Nasonex, and Allegra daily .  Follow up with Dr. Chase Caller in 6-8 weeks and As needed   Please contact office for sooner follow up if symptoms do not improve or worsen or seek emergency care

## 2021-10-16 NOTE — Assessment & Plan Note (Signed)
Severe persistent asthma with allergic phenotype.  Patient's been having recurrent exacerbations since Dupixent was stopped November 2022 at discharge.  It is unclear why Dupixent was stopped at this time.  Patient has had multiple flareups and received several courses of steroids.  She is clinically improved from recent exacerbation.  She is been recommended to start back on Dupixent but at lower dose at 200 mg.  Patient's insurance has not approved Dupixent at this time.  She is completed paperwork for financial assistance.  Today in the office she will be given a sample dose.  And await insurance approval and financial assistance approval.  Patient is to follow back up in our office in 6 weeks and as needed Continue on current maintenance regimen and trigger prevention  Plan  Patient Instructions  Begin Dupixent when approved, will check on sample start.  Continue on Symbicort 2 puffs Twice daily, rinse after use.  Albuterol inhaler or neb as needed.  Mucinex DM Twice daily  As needed  cough/congestion .  Continue on Singulair , Nasonex, and Allegra daily .  Follow up with Dr. Chase Caller in 6-8 weeks and As needed   Please contact office for sooner follow up if symptoms do not improve or worsen or seek emergency care

## 2021-10-16 NOTE — Assessment & Plan Note (Signed)
Patient's blood pressure is elevated today in office.  She denies any neurological symptoms.  She is follow with primary care as discussed

## 2021-10-16 NOTE — Telephone Encounter (Signed)
Rx for Dupixent '200mg'$  SQ PFS sent to [facility]. Still awaiting determination  letter from [employer] pt assistance  Knox Saliva, PharmD, MPH, BCPS, CPP Clinical Pharmacist (Rheumatology and Pulmonology)

## 2021-10-17 ENCOUNTER — Telehealth: Payer: Self-pay | Admitting: Acute Care

## 2021-10-17 NOTE — Telephone Encounter (Signed)
Patient approved for patient assistance. Called patient and advised of approval  Knox Saliva, PharmD, MPH, BCPS, CPP Clinical Pharmacist (Rheumatology and Pulmonology)

## 2021-10-17 NOTE — Telephone Encounter (Signed)
[name] , do we have status of her approval for assistance for Dupixent ?

## 2021-10-17 NOTE — Telephone Encounter (Signed)
I spoke with Dr. Chase Caller today, 10/17/2021,  regarding Ms. Klomp. He had messaged me prior to the patient's 10/08/2021 OV and specified he was in agreement with resuming patient's Dupixent , but at a lower dose than her previous dosage . She had been on the 300 mg dosing regiment before her hospitalization  after  fall in November 2022 when she had Covid. When she was discharged, the medication was discontinued. However, she has had multiple flares, and the decision was made to resume therapy 09/2021, but per Dr. Chase Caller at the lower 200 mg every 14 days dosage. I explained that there has been a delay in resuming treatment due to pending financial assistance for  resumption of the medication per the pharmacy team. Dr. Chase Caller is in agreement with resuming the 300 mg dosing to get her restarted , as she has several doses left that she can use now. I will talk with Rexene Edison NP, who saw the patient last, to see best option to get the patient restarted on therapy as soon as possible.

## 2021-10-17 NOTE — Telephone Encounter (Signed)
Received a fax from  [employer] regarding an approval for [employer] patient assistance from 10/17/21 to 05/11/22.   Phone number: [phone], option 1  Provided patient with [employer] phone number for her to f/u tomorrow to schedule shipment. She will be taking Dupixent '200mg'$  SQ PFS every 14 days. Reviewed dosing with her  Knox Saliva, PharmD, MPH, BCPS, CPP Clinical Pharmacist (Rheumatology and Pulmonology)

## 2021-10-17 NOTE — Telephone Encounter (Signed)
Dupixent [employer] approval is pending - program has changed approval process and we have requested her aplication be expedited multiple times. They expect determination by Monday, 10/21/21  Knox Saliva, PharmD, MPH, BCPS, CPP Clinical Pharmacist (Rheumatology and Pulmonology)

## 2021-10-17 NOTE — Telephone Encounter (Signed)
I have spoken to Rexene Edison, NP. Patient received a 200 mg injection of Dupixent sample in the office 10/16/2021 to re-initiate therapy. She was monitored x 30 minutes , and tolerated the injection well.  Pharmacy is continuing to work on [place] for financial assistance for the medication. She has follow up with Dr. Chase Caller 01/23/2022

## 2021-10-17 NOTE — Telephone Encounter (Signed)
Called Dupixent [employer] for patient assistance application status. Per rep, they still needed verbal consent from patient to move forward. Reminded her that patient provided consent on 10/15/21. She confirmed they have all info needed to process application to completion. Rep requested call back Monday if determination letter is not received by then.   [phone]  Hours: 8 AM-9 PM M-F   [name] Art, Pharm.D. PGY-1 Pharmacy Resident 10/17/2021 2:29 PM

## 2021-10-19 ENCOUNTER — Other Ambulatory Visit: Payer: Self-pay | Admitting: Internal Medicine

## 2021-10-24 NOTE — Telephone Encounter (Signed)
ATC pt to see if she had received Dupixent in the mail yet. LVM asking patient to return the call.   [name] Art, Pharm.D. PGY-1 Pharmacy Resident 10/24/2021 4:16 PM

## 2021-10-28 NOTE — Telephone Encounter (Signed)
Patient received Dupixent in the mail on 10/25/21. Her next injection is due 10/30/21 (first injection done at [facility] on 10/16/21) and she plans to inject in her thigh. Patient reports she has already felt a difference in her symptoms. She was able to play piano for the entire service at church yesterday.   [name] Art, Pharm.D. PGY-1 Pharmacy Resident 10/28/2021 1:13 PM

## 2021-12-08 ENCOUNTER — Other Ambulatory Visit: Payer: Self-pay | Admitting: Cardiovascular Disease

## 2021-12-23 NOTE — Telephone Encounter (Signed)
error 

## 2021-12-25 ENCOUNTER — Other Ambulatory Visit: Payer: Self-pay | Admitting: Cardiovascular Disease

## 2022-01-03 ENCOUNTER — Other Ambulatory Visit: Payer: Self-pay | Admitting: Cardiovascular Disease

## 2022-01-23 ENCOUNTER — Encounter: Payer: Self-pay | Admitting: Internal Medicine

## 2022-01-23 ENCOUNTER — Ambulatory Visit: Payer: Medicare Other | Admitting: Internal Medicine

## 2022-01-23 VITALS — BP 112/68 | HR 80 | Temp 97.9°F | Ht 64.5 in | Wt 134.0 lb

## 2022-01-23 DIAGNOSIS — Z7185 Encounter for immunization safety counseling: Secondary | ICD-10-CM

## 2022-01-23 DIAGNOSIS — J4550 Severe persistent asthma, uncomplicated: Secondary | ICD-10-CM | POA: Diagnosis not present

## 2022-01-23 MED ORDER — BUDESONIDE-FORMOTEROL FUMARATE 160-4.5 MCG/ACT IN AERO: 2.0000 | INHALATION_SPRAY | Freq: Two times a day (BID) | RESPIRATORY_TRACT | 5 refills | Status: DC

## 2022-01-23 NOTE — Progress Notes (Signed)
C.C.:  Follow-up for Severe, Persistent Asthma, Chronic Allergic Rhinitis, DVT, & GERD w/ Hiatal Hernia.  [name] has been seen twice in our office since her last appointment with me. Last visit was on 9/26 with nurse practitioner. [name] was treated for an exacerbation of her asthma with bronchitis. She was given a prednisone taper, instructed to use Mucinex twice daily as needed, and a 7 day course of Augmentin. Since her last appointment she fractured her left hip. She is continuing to walk with a cane. She reports no adverse effects from her Xolair. She does feel the Xolair is helping her significantly. Hasn't needed her rescue inhaler significantly in the last few weeks. Only rare nocturnal awakenings with any dyspnea.   Severe, persistent asthma: [name] currently maintained on a regimen of Xolair, Singulair, Spiriva, and Symbicort. She feels her dyspnea has significantly improved. She still has a scratchy throat & hoarse voice. She has her baseline nonproductive cough. No wheezing.  Chronic allergic rhinitis: Previously referred to ENT. Regimen at last appointment with me included Allegra, Singulair, Nasonex, and Xolair. [name] has had multiple different antibiotic courses for recurrent sinusitis. Only having intermittent sinus congestion & drainage.   DVT: Present in bilateral lower extremities. [name] on systemic anticoagulation with Xarelto indefinitely. No melena or hematochezia. No hematuria.   GERD with hiatal hernia: Previously prescribed Nexium. No reflux or dyspepsia. No morning brash water taste.   Review of Systems  No chest pain or tightness. No fever or chills. No abdominal pain or nausea.    OV 06/15/2017  Chief Complaint  [name] presents with   Follow-up    flare ups with asthma when the weather changes.  Right now it is a cough, with congestions and wheezing. Non-productive dry cough. SOB with any activity.  Hoarseness.   88 year old female.  Transfer of  care from Dr. Ashok Cordia who is left the practice.  Moderate persistent asthma with an elevated IgE.  She is here with her husband.  She tells me that in 2015-2017 she was started on Xolair and then subsequently switched in [place] last year or year before last.  This then caused nonspecific side effect such as erythema in her face and nonspecific pain that reminded her of shingles.  Therefore she discontinued this and the side effects resolved.  She did have 6 doses of the interleukin-5 receptor antibody.  She went back on Xolair.  Overall she feels that since starting Xolair her quality of life and asthma exacerbations have improved but nevertheless she still gets asthma exacerbations every few months and has to go on prednisone.  Currently she states that for the last few to several days she has had increased cough although no change in sputum.  She is also wheezing a little bit more than usual.  Asthma control questionnaire shows for the last week she is waking up a few times at night.  When she wakes up she has moderate symptoms.  Her activities are slightly limited because of asthma.  She is moderate amount of shortness of breath because of asthma and she is wheezing a little of the time and using albuterol for rescue at least 1 or 2 times daily.  Exam nitric oxide is significantly elevated at 125 ppb.  She think she will benefit from prednisone  She she reports to me for the first time a new issue of left anterior cervical lymph node/submandibular lymph node.  She says it is been present for a while but other  physicians were examined it have not felt it.  She asked me to examine it.  She feels it is slowly growing.  Insidious onset for a year.   OV 08/13/2017  Chief Complaint  [name] presents with   Follow-up    Pt states she has had many flare-ups with her asthma. States she has had a lot of sinus drainage and congestion and a lot of chest tightness.    88 year old female with complicated asthma    poor control of asthma continues.  At my last visit she did a prednisone taper.  Then a few weeks ago her sinuses acted up and therefore her asthma acted up and she did another prednisone taper on herself at home but it was just for a few days.  She continues on Spiriva, Symbicort, Singulair, Nexium and Xolair injections.  She feels that ever since Dr. Asencion Noble retired her asthma has been out of control.  Currently for the last few weeks her sinuses have been acting up and she wants antibiotics.  In particular she wants Augmentin.  She says in the past this was deemed as an allergy but she went and saw some doctor in [place] and it looks like she has been desensitized.  Penicillin is no longer listed as an allergy.  She insists that she wants Augmentin.  Review of the chart shows she has had chronic sinusitis and a CT scan of the sinus 1 year ago in February 2018.  She says she has seen ENT for this.  No surgery has been recommended.  Just nasal hygiene and antibiotic courses.  Currently her symptoms are quite significant with asthma control question of greater than 2 it appears this might all just be her baseline.  Her exam nitric oxide is still high over 100.  At night she is waking up several times.  When she wakes up she has moderate symptoms.  Activities are slightly limited because of asthma.  She has moderate amount of shortness of breath because of asthma.  She is wheezing hardly and she uses albuterol for rescue at least 1 or 2 times daily.   OV  Chief Complaint  [name] presents with   Follow-up    Pt states her breathing has been much better since she began new injection but states she is coughing more than before. Denies any CP.    Follow-up severe persistent asthma with eosinophilia: In April 2019 we started dupulimab and stop the Xolair. With this asthma control is significantly better. Her asthma control questionnaire average score has drop to 0.6. Her exhaled nitric oxide is  drop from 100s to 43. She says she is much less short of breath and is able to do a lot more things. She is very pleased with the new injectable. However she feels that her baseline chronic cough is slightly worse. It is moderate in intensity is dry. It does not wake up at night. It is not associated with wheezing or shortness of breath. It is associated with Spiriva MDI. She does clear her throat. Coughing is made worse by talking. Off note, she has new issue of left hip surgery coming up in 2 days. She feels from a pulmonary standpoint she will be stable to do the surgery because of prior experience with surgery.        OV 03/08/2018  Subjective:  [name] ID: [name], female , DOB: 01-Dec-1932 , age 75 y.o. , MRN: [id number] , ADDRESS: [place] [place] [id number]  03/08/2018 -   Chief Complaint  [name] presents with   Follow-up    Sinus draniage and more cough    [name] [name] 88 y.o. -returns for asthma follow-up.  However the issue today is that she reports worsening of sinus drainage for the last 1 month ever since the fall weather change.  He says the sinus drainage is so significant that it is making her cough.  She feels asthma itself is under control.  In fact exam nitric oxide today is 38.  She is continuing inhalers Singulair Nexium and injection dupilumab.  She refused flu shot because she is allergic to it.  Review of the chart indicates March 2018 Dr. Ashok Cordia did a CT scan of the sinus and it showed pansinusitis.  She is says she is seen ENT from 2 years ago and is unclear if it was before the scan or after the scan.  At this point in time she just wants relief from her current sinus issue.  The sinus drainage is possibly clear but she has not looked at it.  There is no fever worsening wheeze or chest tightness.  05/17/2018 [name] presents today with acute complaints of cough and congestion. Accompanied by husband. Productive cough with clear mucus since  05/05/18. Husband and brother were both sick. She was seen at [facility] on 12/29 and given steroid injection and amoxicillin course. Finished antibiotic 2 days ago. Did not notice much of an improvement, if any. She has taken mucinex and delsym for cough. Hasn't had to use rescue inhaler or nebulizer. Due for Dupixent injection on 05/25/18. Did not receive flu injection d/t allergy. Afebrile.   05/25/2018 [name] presents for 1 week follow-up for asthma exacerbation. CXR showed no acute cardiopulmonary disease. Treated with prednisone taper. Due for dupixent injection today. Feeling better than she did last week, still has a cough but sputum has cleared. Felt better yesterday, rain made her symptoms worse. States anytime the weather changes her asthma flares. Using nebulizer as needed, didn't use it as much yesterday. Required a treatment this morning. Takes Singulair and allegra. Afebrile.       OV 06/08/2018  Subjective:  [name] ID: [name], female , DOB: 13-Nov-1932 , age 39 y.o. , MRN: [id number] , ADDRESS: [place] [address]   06/08/2018 -   Chief Complaint  [name] presents with   Follow-up    Pt states she has had sinus problems, cough with clear mucus, and has also had some chest pain/tightness. Pt has been seen by Derl Barrow for asthma exacerbations a couple different times. Pt denies any fever.   Severe asthma on Dupixent.  Also with chronic sinusitis  [name] 88 y.o. -returns for follow-up of her issues.  I last saw her in October 2019.  After that earlier this month she came to see nurse practitioner 2 times for acute on chronic sinus issues.  She tells me that she is tolerating her Dupixent injections just fine.  She feels it is helping her.  However, she is bothered by sinus issues this constantly sinus drainage particularly flared up in January 2020.  She recollects seeing Dr. Redmond Baseman in ENT.  At the time we recommended amoxicillin but she was allergic  to it and no antibiotic therefore was prescribed according to history.  Since then she has seen an allergist in her local area and is been cleared to get amoxicillin.  She is okay seeing her ENT but she wants to change to  her husband's ENT surgeon Dr. Elgie Congo.  Early this month with a sinus flush she got antibiotic and prednisone this helped her.  However she still has significant amount of residual symptoms.  Asthma control question is 2.8 but the nitric oxide level is normal.  This was excessive symptom burden.  She is waking up several times at night.  When she wakes up she has moderate symptoms she is moderately limited in her activities.  Moderate amount of shortness of breath and wheezing a little of the time and using albuterol for rescue at least 1 time daily.  She says is unimproved symptomatology because of the recent antibiotic and prednisone.  She feels she needs another course of antibiotic and prednisone as well.  In addition with the cough she is complaining of musculoskeletal pain in the upper chest and the back.  She feels the lungs are hurting.  There is no radiation.  The course is improving.  Of note, her past medical history lists her as having rheumatoid arthritis but she denies that to be the case.  She says she has osteoarthritis.  She does not see a rheumatologist.  She has had no prior immune work-up.    OV 02/28/2019  Subjective:  [name] ID: [name], female , DOB: March 25, 1933 , age 36 y.o. , MRN: [id number] , ADDRESS: [place] [address]   02/28/2019 -   Chief Complaint  [name] presents with   Follow-up    She reports her breathing has been at her baseline.Allergy to eggs, no flu shot. Reports her dupixent has been great.      [name] [name] 88 y.o. -  Follow-up chronic severe persistent asthma: She is now on Dupixent, Symbicort, Spiriva and Singulair.  She says her asthma is well under control.  Albuterol rescue use is rare.  No  nocturnal awakenings.  She has deferred flu shot because she states "I cannot take it"   Follow-up chronic sinusitis: She has seen ENT Dr. Lorelee Cover and has been prescribed Nasonex.  This is now under control.  New issue of ANA positivity 1: 1280 -in January 2020 because of chronic sinusitis I have ordered autoimmune and vasculitis profile.  Her ANCA profile is negative.  However her ANA is strongly positive.  She admits to having osteoarthritis and osteoporosis.  She denies she has rheumatoid arthritis although this is mentioned in her chart as past medical history.  She denies having seen a rheumatologist.  Review of the chart does not indicate she has had rheumatoid factor tested with an health system.    ROS - per [name]   OV 07/11/2019  Subjective:  [name] ID: [name], female , DOB: 06-30-1932 , age 70 y.o. , MRN: [id number] , ADDRESS: [place] [id number]   07/11/2019 -   Chief Complaint  [name] presents with   Follow-up    Pt states she has been doing good since last visit. States she does have an occ cough.     [name] [name] 88 y.o. -  Follow-up chronic severe asthma: Last seen in October 2020.  Since then she continues with her durvalumab on a scheduled basis.  She visits our office for that.  However she has become less compliant with the Symbicort.  She for the month of February 2021 she is only taken it 2 times.  She also takes Spiriva only rarely.  She takes these 2 on a as needed basis.  She is compliant with her Singulair  on a daily basis.  Albuterol is also as needed.  Today because of the weather changes rainy and warm she feels a remote tightness in her chest.  We do not have exam nitric oxide test.  Asthma control question is 0.8 and is at the upper limit of good control.  She feels the Dupixent has worked tremendously well for her  In terms of her chronic sinusitis: She follows with ENT and takes Nasonex   In terms of her strong positivity for  ANA: Last visit check extended autoimmune profile.  SSA was positive.  She then had a televisit in December 2020 with Dr. Keturah Barre.  I reviewed that note.  She has a face-to-face visit coming up in March 2021.  She does admit to dryness of the eyes.  She might have Sjogren's and I informed this to her.   In terms of vaccine counseling.  She has had allergy to flu shot.  She has other allergies documented below.  She does not want to do the COVID-19 vaccine.  She is social distancing.  She does outdoor church.  She wants to know when life will return to normal.  We discussed summer is a possibility.   OV 01/10/2020   Subjective:  [name] ID: [name], female , DOB: 10-18-1932, age 58 y.o. years. , MRN: [id number],  ADDRESS: [address] [place] [address] PCP  Crist Infante, MD Providers : Treatment Team:  Attending Provider: Brand Males, MD   Chief Complaint  [name] presents with   Follow-up    Severe Asthma       [name] [name] 88 y.o. -returns for follow-up of asthma.  Since her last visit she still remains Covid unvaccinated.  She is scared of the Covid vaccine because of egg allergy.  Because of egg allergy she normally does not do flu shot.  She has done other vaccines.  Asthma continues to be stable.  She is taking Dupixent at full dose every 2 weeks in our office.  She does not want to do this at home.  She does not want to reduce the dose.  She also takes Symbicort at 160/4.52 puff 2 times daily.  She is willing to reduce the dose for this ACT control score is 19 showing good control.  She recently relocated her sister who is 71 years old and has been doing chores.  She is trying to enjoy her [place] mountains in  [place].  Of note even though ACT score is 19 and suggest poor control she tells me she barely ever wakes up in the middle of the night from asthma.  She is not using her albuterol for rescue.  She feels asthma is well  controlled.    Asthma Control Panel  10/11/15 - IgE - 425 , rest of blood allergy panel negative. Blood eos 300cells (al;so 300 on 12/19/16). CT Sinus feb 2018 - chronic pan sinusitis     07/02/2016 06/15/2017  08/13/2017  12/01/2017  06/08/2018  01/10/2020   Current Med Regimen  Spriva, symbicort,  Singulair, xolair, nexium same Spiriva, Symbicort, Singulair, Nexium and dupulimab Spiriva, Symbicort, Singulair, Nexium and dupulimab   Act - ACT - a [employer] test - 4 week. Total score. Max is 25, Lower score is worse.  <19 = poor control      19  ACQ 5 point- 1 week. wtd avg score. <1.0 is good control 0.75-1.25 is grey zone. >1.25 poor control. Delta 0.5 is clinically  meaningful   2.4 0.6 2.9   FeNO ppB  126 ppb 104 ppb 43 19   FeV1  1.777/90%, ratio 74,        Planned intervention  for visit   HRCT, talk to eye doc and if ok start dupixent, augmenting, prednsione,        Asthma Control Test ACT Total Score  01/10/2020 19     OV 08/08/2020  Subjective:  [name] ID: [name], female , DOB: [birth year] , age 65 y.o. , MRN: [id number] , ADDRESS: [address] [place] [address] PCP Crist Infante, MD [name] Care Team: Crist Infante, MD as PCP - General (Internal Medicine) Nahser, Wonda Cheng, MD as PCP - Cardiology (Cardiology) Nahser, Wonda Cheng, MD as Consulting Physician (Cardiology)  This Provider for this visit: Treatment Team:  Attending Provider: Brand Males, MD    08/08/2020 -   Chief Complaint  [name] presents with   Follow-up    Pt states she has been doing good since last visit. States she did have a flare up with allergies 2 weeks ago and states that she is a little SOB today.     [name] [name] 88 y.o. - presents for asthma follow-up.  She tells me that 2 weeks ago because of fall in the spring she started having sinus complaints and since then she has had more chest tightness.  Last few days even cough.  Last visit she decreased her Symbicort at 50% of  dosage.  She feels she needs to go up on it.  She is compliant with Singulair and Dupixent.  She wants to do Dupixent at home.  Today she is feels that she needs help with the asthma because it is an exacerbation.  There is no fever no nocturnal awakening or chest pain orthopnea proximal nocturnal dyspnea.   OV April 2022  08/17/2020 Follow up : Asthma  [name] presents for a 1 week follow-up.  [name] was seen last week for a follow-up for asthma.  She was having more asthma symptoms with cough and wheezing.  She was given a prednisone taper.  Which she says she finished yesterday.  She is feeling much better.  Symptoms are returning back to her baseline.  She has minimum cough and wheezing.  She denies any fever or discolored mucus.  She remains on maintenance therapy with Symbicort twice daily.  She says she had only been taking 1 puff twice daily and now over the last week is increased back up to 2 puffs twice daily.  She is on Singulair daily.  And is on Dupixent.  She says since starting [place] she has felt so much better with her asthma.  And has had less exacerbations.  [name] is starting home injections soon.  And does endorse that she has a up-to-date EpiPen at home.  OV 12/26/2020  Subjective:  [name] ID: [name], female , DOB: [birth year] , age 80 y.o. , MRN: [id number] , ADDRESS: [address] [place] [address] PCP Crist Infante, MD [name] Care Team: Crist Infante, MD as PCP - General (Internal Medicine) Nahser, Wonda Cheng, MD as PCP - Cardiology (Cardiology) Nahser, Wonda Cheng, MD as Consulting Physician (Cardiology)  This Provider for this visit: Treatment Team:  Attending Provider: Brand Males, MD    12/26/2020 -   Chief Complaint  [name] presents with   Follow-up    Pt states she has a cough she can not get rid of also SOB. Pt states symbicort was not working  before running out of medication she not sure if it was because it was outdated.       [name] Jassmin Gendron 88 y.o. -follow-up asthma with sinus drainage.  She continues on Symbicort, Dupixent and Singulair.  She feels her asthma is at baseline even though ACT score shows significant symptoms.  She feels the symptoms are because of postnasal drainage for which she has seen ENT.  She is on nasal steroid, Tessalon cough spells, Chlor-Trimeton, Delsym and Mucinex.  She feels this is resistant to treatment.  This is a chronic problem.  She has seen ENT for this.  She is resigned to this being the baseline.  She says considering everything she is doing well.  She did have specific question about getting EVUSHELD.  She says in December 2020 when she took the [employer]  After that she got blood clot in her left lower extremity.  Review of the medication shows she is on Xarelto.  After that she has not taken mRNA vaccines.  She is nervous about it and she says she is allergic to those.  I do not know what type of allergy.  She says primary care physician referred her for EVUSHE:D but she says she was declined.  She is asking if it is a good idea.  I did indicate to her that given the fast of time and given the fact that she is reluctant to take the other vaccine and she has had issues with the [employer] vaccine but is probably a good idea to get monoclonal antibody prophylaxis particularly given the recent variatnts.  She is asking me to make a referral.        OV 07/09/2021  Subjective:  [name] ID: [name], female , DOB: 26-Jan-1933 , age 29 y.o. , MRN: [id number] , ADDRESS: [address] [place]  [address] PCP Crist Infante, MD [name] Care Team: Crist Infante, MD as PCP - General (Internal Medicine) Nahser, Wonda Cheng, MD as PCP - Cardiology (Cardiology) Nahser, Wonda Cheng, MD as Consulting Physician (Cardiology)  This Provider for this visit: Treatment Team:  Attending Provider: Brand Males, MD    07/09/2021 -   Chief Complaint   [name] presents with   Follow-up     [name] [name] 88 y.o. -asthma follow-up.  Last seen in the summer 2022.  She was on Dupixent and Symbicort and Singulair.  She is also on sinus treatment with different nasal sprays.  After that in November 2022 she fell and fractured her right hip and status post surgery.  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  Then she had COVID early December 2022 for which she was not hospitalized.  All this is resulted in significant weight loss and physical deconditioning.  She is living at home with her husband who is quite active.  She is slowly gaining her strength back.  Through all this since the hospitalization in November 2022 she showed discharge summary where the hospitalist stopped her Symbicort and Dupixent.  She is unclear why.  I read the discharge summary and asthma is not mentioned.  We do not know why this medicine was stopped.  However for the last 1 week she feels because of the COVID recently and also the pollen she is having slightly more respiratory symptoms.  So 1 week ago she restarted her Symbicort and she is beginning to feel better.  Her albuterol rescue use is only 1 time per week.  She is not having any nocturnal symptoms.  We discussed about starting Dupixent right now.  She has a sample with her that will last her till July 2023.  I asked her about the option of not starting Dupixent but just waiting and watching with just Symbicort and Singulair but seeing her rather than shorter interval for follow-up.  She is open to this idea.  We will do some evaluation at that time to see how she is objectively with her asthma.  In the past nitric oxide exhalation test was high    10/16/2021 Follow up : Asthma  [name] returns for a 1 week follow-up.  [name]'s been having difficulty with slow to resolve asthmatic bronchitic exacerbation.  [name] has underlying severe persistent asthma with allergic phenotype.   Previously had been on Symbicort and Dupixent.  Dupixent was stopped in the fall 2022.  She has had multiple flares over the last 6 weeks.  Received steroids on several occasions due to increased cough wheezing and shortness of breath.  She underwent pulmonary function testing that showed normal lung function with no airflow obstruction or restriction.  Lab work showed eosinophils slightly elevated with an absolute count at 300.  Exhaled nitric oxide testing was elevated last visit at 74 ppb.  Chest x-ray in April 2023 showed no acute process. [name] has recently been restarted back on Dupixent at lower dose, has not started yet due to insurance. She has previous doses of Dupixent at home but '300mg'$  doses.   She remains on Symbicort twice daily and Singulair and allegra daily. Feno today is improving at 59  ppb.   Since last office she is feeling better after finishing another steroid pack . Decreased cough and wheezing .     OV 01/23/2022  Subjective:  [name] ID: [name], female , DOB: 01-30-1933 , age 16 y.o. , MRN: [id number] , ADDRESS: [address] [place] [address] PCP Crist Infante, MD [name] Care Team: Crist Infante, MD as PCP - General (Internal Medicine) Nahser, Wonda Cheng, MD as PCP - Cardiology (Cardiology) Nahser, Wonda Cheng, MD as Consulting Physician (Cardiology)  This Provider for this visit: Treatment Team:  Attending Provider: Brand Males, MD    01/23/2022 -   Chief Complaint  [name] presents with   Follow-up    Pt states she has gotten better since last visit and denies any current complaints.   Asthma follow-up  [name] [name] 88 y.o. -returns for follow-up.  I stopped the [place] but then over the summer she started having a lot of respiratory exacerbations.  Then Patricia Nettle restarted her Dupixent.  She says after that she is significantly better.  She is only doing low-dose Dupixent.  Her ACT score is significantly improved.  She is  not doing her Symbicort as yet.  She wants to start her Symbicort with a fall allergy season coming up.  She is continuing her other medications.  She is grateful to [facility].  She tells me that in December 2022 she had COVID from her husband and since then she has had fatigue but Tammy did notice an interim chest x-ray to be clear.  She cannot do the flu shot because of egg allergy.  By extension.  She does not want to do the RSV vaccine.  She did do 1 [place] and that gave her blood clot and so she does not want to do any of the COVID vaccines either.  No other issues.  Asthma Control Panel  10/11/15 - IgE - 425 , rest of blood allergy panel negative. Blood eos 300cells (al;so 300 on 12/19/16). CT Sinus feb 2018 - chronic pan sinusitis     07/02/2016 06/15/2017  08/13/2017  12/01/2017  06/08/2018  01/10/2020  12/26/2020  07/09/2021  June 2023 01/23/2022 Back on duppixen  Current Med Regimen  Spriva, symbicort,  Singulair, xolair, nexium same Spiriva, Symbicort, Singulair, Nexium and dupulimab Spiriva, Symbicort, Singulair, Nexium and dupulimab       Act - ACT - a [employer] test - 4 week. Total score. Max is 25, Lower score is worse.  <19 = poor control      '19 16 15 '$ 8-> 17 22  ACQ 5 point- 1 week. wtd avg score. <1.0 is good control 0.75-1.25 is grey zone. >1.25 poor control. Delta 0.5 is clinically meaningful   2.4 0.6 2.9       FeNO ppB  126 ppb 104 ppb 43 19       FeV1  1.777/90%, ratio 74,            Planned intervention  for visit   HRCT, talk to eye doc and if ok start dupixent, augmenting, prednsione,            Asthma Control Test ACT Total Score  01/23/2022  9:54 AM 22  10/16/2021  9:33 AM 17  10/08/2021  9:01 AM 8     Lab Results  Component Value Date   NITRICOXIDE 24 07/27/2018    PFT     Latest Ref Rng & Units 09/06/2021    8:31 AM 07/02/2016   10:21 AM 01/16/2016    9:29 AM 10/31/2015    8:31 AM 08/03/2015    9:43 AM  PFT Results  FVC-Pre L 2.37   2.39  2.26  1.88  2.29   FVC-Predicted Pre % 97  90  84  70  85   FVC-Post L 2.36  2.51  2.57  2.05  2.53   FVC-Predicted Post % 96  95  96  76  94   Pre FEV1/FVC % % 74  74  68  67  71   Post FEV1/FCV % % 76  77  76  70  72   FEV1-Pre L 1.74  1.77  1.54  1.26  1.63   FEV1-Predicted Pre % 96  90  78  63  81   FEV1-Post L 1.80  1.93  1.95  1.44  1.82   DLCO uncorrected ml/min/mmHg 14.55     17.59   DLCO UNC% % 76     69   DLCO corrected ml/min/mmHg 14.55     18.36   DLCO COR %Predicted % 76     72   DLVA Predicted % 88     97   TLC L 4.95     5.97   TLC % Predicted % 95     115   RV % Predicted % 105     143        has a past medical history of Anemia, Angio-edema, Asthma, severe persistent, Cancer (HCC), Chronic kidney disease, stage II (mild), DDD (degenerative disc disease), cervical, Diverticulitis, Diverticulosis (yrs ago), Edema, lower extremity, GERD (gastroesophageal reflux disease), Headache, Heart murmur, History of breast cancer (1990 left mastectomy also), History of DVT of lower extremity (5 yrs ago), History of shingles, Hyperlipidemia, Internal hemorrhoid, Leg ulcer, left (HCC), Osteoporosis, unspecified, Perennial allergic rhinitis, Peripheral vascular disease ([place]), Pneumonia, Restless legs  syndrome (RLS), Rheumatoid arthritis ([place]), Thyroid nodule, Unspecified essential hypertension, and Urticaria.   reports that she has never smoked. She has never used smokeless tobacco.  Past Surgical History:  Procedure Laterality Date   CARDIAC CATHETERIZATION  08/ 01/ 2008   dr Cathie Olden   normal -- EF of 65%   CARDIOVASCULAR STRESS TEST  05-22-2011   dr Cathie Olden   normal lexiscan perfusion study/  no ischemia/  lvsf  86%   CATARACT EXTRACTION W/ INTRAOCULAR LENS  IMPLANT, BILATERAL     CHOLECYSTECTOMY  1993   laparoscopic   CONVERSION TO TOTAL HIP Left 12/03/2017   Procedure: CONVERSION TO LEFT TOTAL HIP;  Surgeon: Paralee Cancel, MD;  Location: [facility];  Service: Orthopedics;   Laterality: Left;  90 mins   HIP ARTHROPLASTY Left 12/22/2016   Procedure: HEMIARTHROPLASTY, LEFT;  Surgeon: Paralee Cancel, MD;  Location: [facility];  Service: Orthopedics;  Laterality: Left;   INCISION AND DRAINAGE OF WOUND Left 10/24/2013   Procedure: IRRIGATION AND DEBRIDEMENT OF LEFT LEG WITH PLACEMENT OF ACELL AND WOUND [place];  Surgeon: Theodoro Kos, DO;  Location: [facility];  Service: Plastics;  Laterality: Left;   KNEE ARTHROPLASTY     LUMBAR [place] Bilateral rigth 1989///   left  1990   breast cancer 1989//   fibrocytic disease 1990   ORIF HUMERUS FRACTURE Right 09/21/2018   Procedure: OPEN REDUCTION INTERNAL FIXATION (ORIF) PROXIMAL HUMERUS FRACTURE;  Surgeon: Nicholes Stairs, MD;  Location: [facility];  Service: Orthopedics;  Laterality: Right;  90 mins   TOTAL ABDOMINAL HYSTERECTOMY W/ BILATERAL SALPINGOOPHORECTOMY  1989   done at same time as right mastectomy   TOTAL HIP ARTHROPLASTY Right 04/02/2021   Procedure: TOTAL HIP ARTHROPLASTY ANTERIOR APPROACH;  Surgeon: Paralee Cancel, MD;  Location: [facility];  Service: Orthopedics;  Laterality: Right;   TOTAL KNEE ARTHROPLASTY Right 10/05/2012   Procedure: RIGHT TOTAL KNEE ARTHROPLASTY;  Surgeon: Mauri Pole, MD;  Location: [facility];  Service: Orthopedics;  Laterality: Right;    Allergies  Allergen Reactions   Codeine Nausea And Vomiting   Ace Inhibitors     Other reaction(s): cough   Amlodipine     Other reaction(s): too much edemastopped it around 4/17   Egg Phospholipids    Eggs Or Egg-Derived Products     UNSPECIFIED REACTION  RAW EGGS.. Can eat cooked eggs   Ferrous Sulfate     Other reaction(s): hurt stomach.   Hydrocodone Nausea And Vomiting   Influenza Vaccines     UNSPECIFIED REACTION  Egg allergy    Metoprolol     Other reaction(s): cannot sleep on it and i worry about beta blocker with her asthma   Nucala [Mepolizumab]     UNSPECIFIED REACTION    Telithromycin      Other reaction(s): N/V possibly   Cephalosporins Rash   Gabapentin Nausea Only   Levofloxacin Rash   Pneumococcal Vaccines Rash    Immunization History  Administered Date(s) Administered   Influenza Split 01/24/2009, 06/03/2011, 08/12/2012, 09/05/2014   Janssen (J&J) SARS-COV-2 Vaccination 04/19/2020   Pneumococcal Conjugate-13 09/14/2012   Pneumococcal Polysaccharide-23 01/31/2009, 06/03/2011   Td 06/03/2011   Tdap 09/07/2018   Zoster Recombinat (Shingrix) 04/18/2017, 09/16/2017   Zoster, Live 07/30/2007, 06/03/2011    Family History  Problem Relation Age of Onset   Heart attack Mother    Asthma Mother    Heart disease Mother    Hyperlipidemia Mother  Allergic rhinitis Mother    Heart attack Father    Heart failure Father    Deep vein thrombosis Father    Heart disease Father    Peripheral vascular disease Father        amputation   Asthma Brother    Allergic rhinitis Brother    Asthma Sister    Eczema Sister    Allergic rhinitis Sister    Cancer Brother    Asthma Brother    Coronary artery disease Sister    Heart disease Sister    Asthma Maternal Grandmother    Urticaria Neg Hx      Current Outpatient Medications:    albuterol (PROAIR HFA) 108 (90 Base) MCG/ACT inhaler, Inhale 2 puffs into the lungs every 6 (six) hours as needed for wheezing or shortness of breath., Disp: 18 g, Rfl: 5   albuterol (PROVENTIL) (2.5 MG/3ML) 0.083% nebulizer solution, Take 3 mLs (2.5 mg total) by nebulization every 6 (six) hours as needed for wheezing or shortness of breath., Disp: 75 mL, Rfl: 12   budesonide-formoterol (SYMBICORT) 160-4.5 MCG/ACT inhaler, 2 puffs po bid (but as of 11/22/11 on a break for two weeks for throat irritation but going to resume it soon), Disp: , Rfl:    Calcium Carbonate-Vitamin D 600-400 MG-UNIT tablet, Take 1 tablet by mouth daily., Disp: , Rfl:    chlorpheniramine (CHLOR-TRIMETON) 4 MG tablet, Take 4-8 mg by mouth See admin instructions. 4 mg in the  morning and 8 mg at bedtime, Disp: , Rfl:    Cholecalciferol (VITAMIN D3) 1000 UNITS tablet, Take 1,000 Units by mouth every evening. , Disp: , Rfl:    dextromethorphan (DELSYM) 30 MG/5ML liquid, Take 60 mg by mouth 2 (two) times daily as needed for cough., Disp: , Rfl:    docusate sodium (COLACE) 100 MG capsule, Take 1 capsule (100 mg total) by mouth 2 (two) times daily., Disp: 10 capsule, Rfl: 0   dupilumab (DUPIXENT) 200 MG/1.14ML prefilled syringe, Inject 200 mg into the skin every 14 (fourteen) days., Disp: 6.84 mL, Rfl: 1   feeding supplement (ENSURE ENLIVE / ENSURE PLUS) LIQD, Take 272ms by mouth twice a day between meals, Disp: , Rfl:    fexofenadine (ALLEGRA) 180 MG tablet, Take 180 mg by mouth daily.  , Disp: , Rfl:    furosemide (LASIX) 20 MG tablet, TAKE 1 TABLET (20 MG TOTAL) BY MOUTH DAILY AS NEEDED FOR FLUID OR EDEMA., Disp: 90 tablet, Rfl: 1   ipratropium (ATROVENT) 0.06 % nasal spray, Place 2 sprays into both nostrils 2 (two) times daily., Disp: 15 mL, Rfl: 6   mirtazapine (REMERON) 15 MG tablet, Take 15 mg by mouth at bedtime., Disp: , Rfl:    mometasone (NASONEX) 50 MCG/ACT nasal spray, USE 2 SPRAYS NASALLY 2 TIMES DAILY, Disp: 17 g, Rfl: 11   montelukast (SINGULAIR) 10 MG tablet, TAKE 1 TABLET BY MOUTH EVERY NIGHT AT BEDTIME, Disp: 90 tablet, Rfl: 2   Multiple Vitamin (MULTIVITAMIN WITH MINERALS) TABS tablet, Take 1 tablet by mouth daily. Centrum Silver Multivitamin, Disp: , Rfl:    multivitamin-lutein (OCUVITE-LUTEIN) CAPS capsule, Take 1 capsule by mouth daily., Disp: , Rfl:    mupirocin ointment (BACTROBAN) 2 %, as needed. , Disp: , Rfl:    nitroGLYCERIN (NITROSTAT) 0.4 MG SL tablet, Place 1 tablet (0.4 mg total) under the tongue every 5 (five) minutes as needed for chest pain., Disp: 25 tablet, Rfl: 3   pantoprazole (PROTONIX) 40 MG tablet, Take 40 mg by mouth  daily., Disp: , Rfl:    potassium chloride (KLOR-CON) 10 MEQ tablet, TAKE 1 TABLET BY MOUTH EVERY DAY, Disp: 90  tablet, Rfl: 2   pramipexole (MIRAPEX) 0.125 MG tablet, Take 0.375 mg by mouth at bedtime., Disp: , Rfl:    rivaroxaban (XARELTO) 10 MG TABS tablet, Take 10 mg by mouth daily., Disp: , Rfl:    rosuvastatin (CRESTOR) 5 MG tablet, TAKE 1 TABLET BY MOUTH EVERYDAY AT BEDTIME, Disp: 90 tablet, Rfl: 2   Spacer/Aero-Holding Chambers (AEROCHAMBER MV) inhaler, by Other route. Use as instructed , Disp: , Rfl:    telmisartan (MICARDIS) 20 MG tablet, Take 20 mg by mouth daily. , Disp: , Rfl:    vitamin B-12 (CYANOCOBALAMIN) 500 MCG tablet, Take 500 mcg by mouth daily.  , Disp: , Rfl:   Current Facility-Administered Medications:    albuterol (PROVENTIL) (2.5 MG/3ML) 0.083% nebulizer solution 2.5 mg, 2.5 mg, Nebulization, Q6H PRN, Magdalen Spatz, NP, 2.5 mg at 10/08/21 0918   albuterol (VENTOLIN HFA) 108 (90 Base) MCG/ACT inhaler 2 puff, 2 puff, Inhalation, Once PRN, Causey, Charlestine Massed, NP   diphenhydrAMINE (BENADRYL) injection 50 mg, 50 mg, Intramuscular, Once PRN, Causey, Charlestine Massed, NP   EPINEPHrine (EPI-PEN) injection 0.3 mg, 0.3 mg, Intramuscular, Once PRN, Gardenia Phlegm, NP      Objective:   Vitals:   01/23/22 0955  BP: 112/68  Pulse: 80  Temp: 97.9 F (36.6 C)  TempSrc: Oral  SpO2: 97%  Weight: 134 lb (60.8 kg)  Height: 5' 4.5" (1.638 m)    Estimated body mass index is 22.65 kg/m as calculated from the following:   Height as of this encounter: 5' 4.5" (1.638 m).   Weight as of this encounter: 134 lb (60.8 kg).  '@WEIGHTCHANGE'$ @  [employer]   01/23/22 0955  Weight: 134 lb (60.8 kg)     Physical Exam   General: No distress. Looks well Neuro: Alert and Oriented x 3. GCS 15. Speech normal Psych: Pleasant Resp:  Barrel Chest - no.  Wheeze - no, Crackles - no, No overt respiratory distress some clearing of the throat CVS: Normal heart sounds. Murmurs - no Ext: Stigmata of Connective Tissue Disease - no HEENT: Normal upper airway. PEERL +. No post nasal  drip        Assessment:       ICD-10-CM   1. Severe persistent asthma, unspecified whether complicated  K09.38     2. Vaccine counseling  Z71.85          Plan:     [name] Instructions     ICD-10-CM   1. Severe persistent asthma, unspecified whether complicated  H82.99     2. Vaccine counseling  Z71.85       Well controlled after restart dupixent summer 2023  plan - continue low dosen Dupixent   = restart Symbicort 2 puffs Twice daily, rinse after use.  Albuterol inhaler or neb as needed.  Mucinex DM Twice daily  As needed  cough/congestion .  Continue on Singulair , Nasonex, and Allegra daily .  Respect no respiratory vaccines due to allergies  Plan Follow up with Dr. Chase Caller in12-16  weeks and As needed   Please contact office for sooner follow up if symptoms do not improve or worsen or seek emergency care       SIGNATURE    Dr. Brand Males, M.D., F.C.C.P,  Pulmonary and Critical Care Medicine Staff Physician, [facility] Director - Interstitial Lung Disease  Program  [place] [facility] [place], [place], [address]  Pager: [phone], If no answer or between  15:00h - 7:00h: call [phone] Telephone: [phone]  10:15 AM 01/23/2022

## 2022-01-23 NOTE — Patient Instructions (Addendum)
ICD-10-CM   1. Severe persistent asthma, unspecified whether complicated  T62.26     2. Vaccine counseling  Z71.85       Well controlled after restart dupixent summer 2023  plan - continue low dosen Dupixent   = restart Symbicort 2 puffs Twice daily, rinse after use.  Albuterol inhaler or neb as needed.  Mucinex DM Twice daily  As needed  cough/congestion .  Continue on Singulair , Nasonex, and Allegra daily .  Respect no respiratory vaccines due to allergies  Plan Follow up with Dr. Chase Caller in12-16  weeks and As needed   Please contact office for sooner follow up if symptoms do not improve or worsen or seek emergency care

## 2022-01-23 NOTE — Addendum Note (Signed)
Addended by: Lorretta Harp on: 01/23/2022 11:01 AM   Modules accepted: Orders

## 2022-01-28 ENCOUNTER — Other Ambulatory Visit: Payer: Self-pay | Admitting: Pharmacist

## 2022-01-28 DIAGNOSIS — J4550 Severe persistent asthma, uncomplicated: Secondary | ICD-10-CM

## 2022-01-28 MED ORDER — DUPIXENT 200 MG/1.14ML ~~LOC~~ SOSY: 200.0000 mg | PREFILLED_SYRINGE | SUBCUTANEOUS | 1 refills | Status: DC

## 2022-01-28 NOTE — Telephone Encounter (Signed)
Refill sent for [place] to [facility]: [phone]  Dose: 200 mg SQ every 2 weeks  Last OV: 01/23/22 Provider: Dr. Chase Caller  Next OV: 3-4 months (04/24/22)  Knox Saliva, PharmD, MPH, BCPS Clinical Pharmacist (Rheumatology and Pulmonology)

## 2022-02-06 ENCOUNTER — Other Ambulatory Visit: Payer: Self-pay | Admitting: Pharmacist

## 2022-02-06 DIAGNOSIS — J4550 Severe persistent asthma, uncomplicated: Secondary | ICD-10-CM

## 2022-04-24 ENCOUNTER — Ambulatory Visit: Payer: Medicare Other | Admitting: Internal Medicine

## 2022-04-24 ENCOUNTER — Encounter: Payer: Self-pay | Admitting: Internal Medicine

## 2022-04-24 VITALS — BP 138/84 | HR 84 | Ht 64.5 in | Wt 138.8 lb

## 2022-04-24 DIAGNOSIS — Z7185 Encounter for immunization safety counseling: Secondary | ICD-10-CM

## 2022-04-24 DIAGNOSIS — J4550 Severe persistent asthma, uncomplicated: Secondary | ICD-10-CM | POA: Diagnosis not present

## 2022-04-24 NOTE — Progress Notes (Signed)
C.C.:  Follow-up for Severe, Persistent Asthma, Chronic Allergic Rhinitis, DVT, & GERD w/ Hiatal Hernia.  HPI Patient has been seen twice in our office since her last appointment with me. Last visit was on 9/26 with nurse practitioner. Patient was treated for an exacerbation of her asthma with bronchitis. She was given a prednisone taper, instructed to use Mucinex twice daily as needed, and a 7 day course of Augmentin. Since her last appointment she fractured her left hip. She is continuing to walk with a cane. She reports no adverse effects from her Xolair. She does feel the Xolair is helping her significantly. Hasn't needed her rescue inhaler significantly in the last few weeks. Only rare nocturnal awakenings with any dyspnea.   Severe, persistent asthma: Patient currently maintained on a regimen of Xolair, Singulair, Spiriva, and Symbicort. She feels her dyspnea has significantly improved. She still has a scratchy throat & hoarse voice. She has her baseline nonproductive cough. No wheezing.  Chronic allergic rhinitis: Previously referred to ENT. Regimen at last appointment with me included Allegra, Singulair, Nasonex, and Xolair. Patient has had multiple different antibiotic courses for recurrent sinusitis. Only having intermittent sinus congestion & drainage.   DVT: Present in bilateral lower extremities. Patient on systemic anticoagulation with Xarelto indefinitely. No melena or hematochezia. No hematuria.   GERD with hiatal hernia: Previously prescribed Nexium. No reflux or dyspepsia. No morning brash water taste.   Review of Systems  No chest pain or tightness. No fever or chills. No abdominal pain or nausea.    OV 06/15/2017  Chief Complaint  Patient presents with   Follow-up    flare ups with asthma when the weather changes.  Right now it is a cough, with congestions and wheezing. Non-productive dry cough. SOB with any activity.  Hoarseness.   88 year old female.  Transfer of  care from Dr. Ashok Cordia who is left the practice.  Moderate persistent asthma with an elevated IgE.  She is here with her husband.  She tells me that in 2015-2017 she was started on Xolair and then subsequently switched in [place] last year or year before last.  This then caused nonspecific side effect such as erythema in her face and nonspecific pain that reminded her of shingles.  Therefore she discontinued this and the side effects resolved.  She did have 6 doses of the interleukin-5 receptor antibody.  She went back on Xolair.  Overall she feels that since starting Xolair her quality of life and asthma exacerbations have improved but nevertheless she still gets asthma exacerbations every few months and has to go on prednisone.  Currently she states that for the last few to several days she has had increased cough although no change in sputum.  She is also wheezing a little bit more than usual.  Asthma control questionnaire shows for the last week she is waking up a few times at night.  When she wakes up she has moderate symptoms.  Her activities are slightly limited because of asthma.  She is moderate amount of shortness of breath because of asthma and she is wheezing a little of the time and using albuterol for rescue at least 1 or 2 times daily.  Exam nitric oxide is significantly elevated at 125 ppb.  She think she will benefit from prednisone  She she reports to me for the first time a new issue of left anterior cervical lymph node/submandibular lymph node.  She says it is been present for a while but  other physicians were examined it have not felt it.  She asked me to examine it.  She feels it is slowly growing.  Insidious onset for a year.   OV 08/13/2017  Chief Complaint  Patient presents with   Follow-up    Pt states she has had many flare-ups with her asthma. States she has had a lot of sinus drainage and congestion and a lot of chest tightness.    88 year old female with complicated asthma    poor control of asthma continues.  At my last visit she did a prednisone taper.  Then a few weeks ago her sinuses acted up and therefore her asthma acted up and she did another prednisone taper on herself at home but it was just for a few days.  She continues on Spiriva, Symbicort, Singulair, Nexium and Xolair injections.  She feels that ever since Dr. Asencion Noble retired her asthma has been out of control.  Currently for the last few weeks her sinuses have been acting up and she wants antibiotics.  In particular she wants Augmentin.  She says in the past this was deemed as an allergy but she went and saw some doctor in [place] and it looks like she has been desensitized.  Penicillin is no longer listed as an allergy.  She insists that she wants Augmentin.  Review of the chart shows she has had chronic sinusitis and a CT scan of the sinus 1 year ago in February 2018.  She says she has seen ENT for this.  No surgery has been recommended.  Just nasal hygiene and antibiotic courses.  Currently her symptoms are quite significant with asthma control question of greater than 2 it appears this might all just be her baseline.  Her exam nitric oxide is still high over 100.  At night she is waking up several times.  When she wakes up she has moderate symptoms.  Activities are slightly limited because of asthma.  She has moderate amount of shortness of breath because of asthma.  She is wheezing hardly and she uses albuterol for rescue at least 1 or 2 times daily.   OV  Chief Complaint  Patient presents with   Follow-up    Pt states her breathing has been much better since she began new injection but states she is coughing more than before. Denies any CP.    Follow-up severe persistent asthma with eosinophilia: In April 2019 we started dupulimab and stop the Xolair. With this asthma control is significantly better. Her asthma control questionnaire average score has drop to 0.6. Her exhaled nitric oxide is  drop from 100s to 43. She says she is much less short of breath and is able to do a lot more things. She is very pleased with the new injectable. However she feels that her baseline chronic cough is slightly worse. It is moderate in intensity is dry. It does not wake up at night. It is not associated with wheezing or shortness of breath. It is associated with Spiriva MDI. She does clear her throat. Coughing is made worse by talking. Off note, she has new issue of left hip surgery coming up in 2 days. She feels from a pulmonary standpoint she will be stable to do the surgery because of prior experience with surgery.        OV 03/08/2018  Subjective:  Patient ID: [name], female , DOB: 04-Jun-1932 , age 24 y.o. , MRN: [id number] , ADDRESS: [address] [place] [address]  03/08/2018 -   Chief Complaint  Patient presents with   Follow-up    Sinus draniage and more cough    HPI Twila Dwyer 88 y.o. -returns for asthma follow-up.  However the issue today is that she reports worsening of sinus drainage for the last 1 month ever since the fall weather change.  He says the sinus drainage is so significant that it is making her cough.  She feels asthma itself is under control.  In fact exam nitric oxide today is 38.  She is continuing inhalers Singulair Nexium and injection dupilumab.  She refused flu shot because she is allergic to it.  Review of the chart indicates March 2018 Dr. Ashok Cordia did a CT scan of the sinus and it showed pansinusitis.  She is says she is seen ENT from 2 years ago and is unclear if it was before the scan or after the scan.  At this point in time she just wants relief from her current sinus issue.  The sinus drainage is possibly clear but she has not looked at it.  There is no fever worsening wheeze or chest tightness.  05/17/2018 Patient presents today with acute complaints of cough and congestion. Accompanied by husband. Productive cough with clear mucus since  05/05/18. Husband and brother were both sick. She was seen at [facility] on 12/29 and given steroid injection and amoxicillin course. Finished antibiotic 2 days ago. Did not notice much of an improvement, if any. She has taken mucinex and delsym for cough. Hasn't had to use rescue inhaler or nebulizer. Due for Dupixent injection on 05/25/18. Did not receive flu injection d/t allergy. Afebrile.   05/25/2018 Patient presents for 1 week follow-up for asthma exacerbation. CXR showed no acute cardiopulmonary disease. Treated with prednisone taper. Due for dupixent injection today. Feeling better than she did last week, still has a cough but sputum has cleared. Felt better yesterday, rain made her symptoms worse. States anytime the weather changes her asthma flares. Using nebulizer as needed, didn't use it as much yesterday. Required a treatment this morning. Takes Singulair and allegra. Afebrile.       OV 06/08/2018  Subjective:  Patient ID: [name], female , DOB: 13-Nov-1932 , age 39 y.o. , MRN: [id number] , ADDRESS: [place] [address]   06/08/2018 -   Chief Complaint  Patient presents with   Follow-up    Pt states she has had sinus problems, cough with clear mucus, and has also had some chest pain/tightness. Pt has been seen by Derl Barrow for asthma exacerbations a couple different times. Pt denies any fever.   Severe asthma on Dupixent.  Also with chronic sinusitis  HPI Colleene Zaremba 88 y.o. -returns for follow-up of her issues.  I last saw her in October 2019.  After that earlier this month she came to see nurse practitioner 2 times for acute on chronic sinus issues.  She tells me that she is tolerating her Dupixent injections just fine.  She feels it is helping her.  However, she is bothered by sinus issues this constantly sinus drainage particularly flared up in January 2020.  She recollects seeing Dr. Redmond Baseman in ENT.  At the time we recommended amoxicillin but she was allergic  to it and no antibiotic therefore was prescribed according to history.  Since then she has seen an allergist in her local area and is been cleared to get amoxicillin.  She is okay seeing her ENT but she wants to change to  her husband's ENT surgeon Dr. Elgie Congo.  Early this month with a sinus flush she got antibiotic and prednisone this helped her.  However she still has significant amount of residual symptoms.  Asthma control question is 2.8 but the nitric oxide level is normal.  This was excessive symptom burden.  She is waking up several times at night.  When she wakes up she has moderate symptoms she is moderately limited in her activities.  Moderate amount of shortness of breath and wheezing a little of the time and using albuterol for rescue at least 1 time daily.  She says is unimproved symptomatology because of the recent antibiotic and prednisone.  She feels she needs another course of antibiotic and prednisone as well.  In addition with the cough she is complaining of musculoskeletal pain in the upper chest and the back.  She feels the lungs are hurting.  There is no radiation.  The course is improving.  Of note, her past medical history lists her as having rheumatoid arthritis but she denies that to be the case.  She says she has osteoarthritis.  She does not see a rheumatologist.  She has had no prior immune work-up.    OV 02/28/2019  Subjective:  Patient ID: [name], female , DOB: March 25, 1933 , age 36 y.o. , MRN: [id number] , ADDRESS: [place] [address]   02/28/2019 -   Chief Complaint  Patient presents with   Follow-up    She reports her breathing has been at her baseline.Allergy to eggs, no flu shot. Reports her dupixent has been great.      HPI Sadey Gotham 88 y.o. -  Follow-up chronic severe persistent asthma: She is now on Dupixent, Symbicort, Spiriva and Singulair.  She says her asthma is well under control.  Albuterol rescue use is rare.  No  nocturnal awakenings.  She has deferred flu shot because she states "I cannot take it"   Follow-up chronic sinusitis: She has seen ENT Dr. Lorelee Cover and has been prescribed Nasonex.  This is now under control.  New issue of ANA positivity 1: 1280 -in January 2020 because of chronic sinusitis I have ordered autoimmune and vasculitis profile.  Her ANCA profile is negative.  However her ANA is strongly positive.  She admits to having osteoarthritis and osteoporosis.  She denies she has rheumatoid arthritis although this is mentioned in her chart as past medical history.  She denies having seen a rheumatologist.  Review of the chart does not indicate she has had rheumatoid factor tested with an health system.    ROS - per HPI   OV 07/11/2019  Subjective:  Patient ID: [name], female , DOB: 06-30-1932 , age 70 y.o. , MRN: [id number] , ADDRESS: [place] [id number]   07/11/2019 -   Chief Complaint  Patient presents with   Follow-up    Pt states she has been doing good since last visit. States she does have an occ cough.     HPI Jerene Morriss 88 y.o. -  Follow-up chronic severe asthma: Last seen in October 2020.  Since then she continues with her durvalumab on a scheduled basis.  She visits our office for that.  However she has become less compliant with the Symbicort.  She for the month of February 2021 she is only taken it 2 times.  She also takes Spiriva only rarely.  She takes these 2 on a as needed basis.  She is compliant with her Singulair  on a daily basis.  Albuterol is also as needed.  Today because of the weather changes rainy and warm she feels a remote tightness in her chest.  We do not have exam nitric oxide test.  Asthma control question is 0.8 and is at the upper limit of good control.  She feels the Dupixent has worked tremendously well for her  In terms of her chronic sinusitis: She follows with ENT and takes Nasonex   In terms of her strong positivity for  ANA: Last visit check extended autoimmune profile.  SSA was positive.  She then had a televisit in December 2020 with Dr. Keturah Barre.  I reviewed that note.  She has a face-to-face visit coming up in March 2021.  She does admit to dryness of the eyes.  She might have Sjogren's and I informed this to her.   In terms of vaccine counseling.  She has had allergy to flu shot.  She has other allergies documented below.  She does not want to do the COVID-19 vaccine.  She is social distancing.  She does outdoor church.  She wants to know when life will return to normal.  We discussed summer is a possibility.   OV 01/10/2020   Subjective:  Patient ID: [name], female , DOB: [birth year], age 22 y.o. years. , MRN: [id number],  ADDRESS: [address] [place] [address] PCP  Crist Infante, MD Providers : Treatment Team:  Attending Provider: Brand Males, MD   Chief Complaint  Patient presents with   Follow-up    Severe Asthma       HPI [facility] Uriostegui 88 y.o. -returns for follow-up of asthma.  Since her last visit she still remains Covid unvaccinated.  She is scared of the Covid vaccine because of egg allergy.  Because of egg allergy she normally does not do flu shot.  She has done other vaccines.  Asthma continues to be stable.  She is taking Dupixent at full dose every 2 weeks in our office.  She does not want to do this at home.  She does not want to reduce the dose.  She also takes Symbicort at 160/4.52 puff 2 times daily.  She is willing to reduce the dose for this ACT control score is 19 showing good control.  She recently relocated her sister who is 41 years old and has been doing chores.  She is trying to enjoy her [place] in  [place].  Of note even though ACT score is 19 and suggest poor control she tells me she barely ever wakes up in the middle of the night from asthma.  She is not using her albuterol for rescue.  She feels asthma is well  controlled.    OV 08/08/2020  Subjective:  Patient ID: [name], female , DOB: [birth year] , age 48 y.o. , MRN: [id number] , ADDRESS: [address] [place] [address] PCP Crist Infante, MD Patient Care Team: Crist Infante, MD as PCP - General (Internal Medicine) Nahser, Wonda Cheng, MD as PCP - Cardiology (Cardiology) Nahser, Wonda Cheng, MD as Consulting Physician (Cardiology)  This Provider for this visit: Treatment Team:  Attending Provider: Brand Males, MD    08/08/2020 -   Chief Complaint  Patient presents with   Follow-up    Pt states she has been doing good since last visit. States she did have a flare up with allergies 2 weeks ago and states that she is a little SOB today.  HPI Onica Cartwright 88 y.o. - presents for asthma follow-up.  She tells me that 2 weeks ago because of fall in the spring she started having sinus complaints and since then she has had more chest tightness.  Last few days even cough.  Last visit she decreased her Symbicort at 50% of dosage.  She feels she needs to go up on it.  She is compliant with Singulair and Dupixent.  She wants to do Dupixent at home.  Today she is feels that she needs help with the asthma because it is an exacerbation.  There is no fever no nocturnal awakening or chest pain orthopnea proximal nocturnal dyspnea.   OV April 2022  08/17/2020 Follow up : Asthma  Patient presents for a 1 week follow-up.  Patient was seen last week for a follow-up for asthma.  She was having more asthma symptoms with cough and wheezing.  She was given a prednisone taper.  Which she says she finished yesterday.  She is feeling much better.  Symptoms are returning back to her baseline.  She has minimum cough and wheezing.  She denies any fever or discolored mucus.  She remains on maintenance therapy with Symbicort twice daily.  She says she had only been taking 1 puff twice daily and now over the last week is increased back up to 2 puffs twice  daily.  She is on Singulair daily.  And is on Dupixent.  She says since starting [place] she has felt so much better with her asthma.  And has had less exacerbations.  Patient is starting home injections soon.  And does endorse that she has a up-to-date EpiPen at home.  OV 12/26/2020  Subjective:  Patient ID: [name], female , DOB: Sep 22, 1932 , age 43 y.o. , MRN: [id number] , ADDRESS: [address] [place] [address] PCP Crist Infante, MD Patient Care Team: Crist Infante, MD as PCP - General (Internal Medicine) Nahser, Wonda Cheng, MD as PCP - Cardiology (Cardiology) Nahser, Wonda Cheng, MD as Consulting Physician (Cardiology)  This Provider for this visit: Treatment Team:  Attending Provider: Brand Males, MD    12/26/2020 -   Chief Complaint  Patient presents with   Follow-up    Pt states she has a cough she can not get rid of also SOB. Pt states symbicort was not working before running out of medication she not sure if it was because it was outdated.      HPI Melaya Bajorek 88 y.o. -follow-up asthma with sinus drainage.  She continues on Symbicort, Dupixent and Singulair.  She feels her asthma is at baseline even though ACT score shows significant symptoms.  She feels the symptoms are because of postnasal drainage for which she has seen ENT.  She is on nasal steroid, Tessalon cough spells, Chlor-Trimeton, Delsym and Mucinex.  She feels this is resistant to treatment.  This is a chronic problem.  She has seen ENT for this.  She is resigned to this being the baseline.  She says considering everything she is doing well.  She did have specific question about getting EVUSHELD.  She says in December 2020 when she took the [employer]  After that she got blood clot in her left lower extremity.  Review of the medication shows she is on Xarelto.  After that she has not taken mRNA vaccines.  She is nervous about it and she says she is allergic to those.  I do not know  what type of allergy.  She says primary care physician referred her for EVUSHE:D but she says she was declined.  She is asking if it is a good idea.  I did indicate to her that given the fast of time and given the fact that she is reluctant to take the other vaccine and she has had issues with the [employer] vaccine but is probably a good idea to get monoclonal antibody prophylaxis particularly given the recent variatnts.  She is asking me to make a referral.        OV 07/09/2021  Subjective:  Patient ID: [name], female , DOB: [birth year] , age 17 y.o. , MRN: [id number] , ADDRESS: [address] [place] [address] PCP Crist Infante, MD Patient Care Team: Crist Infante, MD as PCP - General (Internal Medicine) Nahser, Wonda Cheng, MD as PCP - Cardiology (Cardiology) Nahser, Wonda Cheng, MD as Consulting Physician (Cardiology)  This Provider for this visit: Treatment Team:  Attending Provider: Brand Males, MD    07/09/2021 -   Chief Complaint  Patient presents with   Follow-up     HPI [facility] Clauss 88 y.o. -asthma follow-up.  Last seen in the summer 2022.  She was on Dupixent and Symbicort and Singulair.  She is also on sinus treatment with different nasal sprays.  After that in November 2022 she fell and fractured her right hip and status post surgery.  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  Then she had COVID early December 2022 for which she was not hospitalized.  All this is resulted in significant weight loss and physical deconditioning.  She is living at home with her husband who is quite active.  She is slowly gaining her strength back.  Through all this since the hospitalization in November 2022 she showed discharge summary where the hospitalist stopped her Symbicort and Dupixent.  She is unclear why.  I read the discharge summary and asthma is not mentioned.  We do not know why this medicine was stopped.  However for the last 1  week she feels because of the COVID recently and also the pollen she is having slightly more respiratory symptoms.  So 1 week ago she restarted her Symbicort and she is beginning to feel better.  Her albuterol rescue use is only 1 time per week.  She is not having any nocturnal symptoms.  We discussed about starting Dupixent right now.  She has a sample with her that will last her till July 2023.  I asked her about the option of not starting Dupixent but just waiting and watching with just Symbicort and Singulair but seeing her rather than shorter interval for follow-up.  She is open to this idea.  We will do some evaluation at that time to see how she is objectively with her asthma.  In the past nitric oxide exhalation test was high    10/16/2021 Follow up : Asthma  Patient returns for a 1 week follow-up.  Patient's been having difficulty with slow to resolve asthmatic bronchitic exacerbation.  Patient has underlying severe persistent asthma with allergic phenotype.  Previously had been on Symbicort and Dupixent.  Dupixent was stopped in the fall 2022.  She has had multiple flares over the last 6 weeks.  Received steroids on several occasions due to increased cough wheezing and shortness of breath.  She underwent pulmonary function testing that showed normal lung function with no airflow obstruction or restriction.  Lab work showed eosinophils slightly elevated with  an absolute count at 300.  Exhaled nitric oxide testing was elevated last visit at 74 ppb.  Chest x-ray in April 2023 showed no acute process. Patient has recently been restarted back on Dupixent at lower dose, has not started yet due to insurance. She has previous doses of Dupixent at home but '300mg'$  doses.   She remains on Symbicort twice daily and Singulair and allegra daily. Feno today is improving at 59  ppb.   Since last office she is feeling better after finishing another steroid pack . Decreased cough and wheezing .     OV  01/23/2022  Subjective:  Patient ID: [name], female , DOB: 1933/02/13 , age 88 y.o. , MRN: [id number] , ADDRESS: [address] [place] [address] PCP Crist Infante, MD Patient Care Team: Crist Infante, MD as PCP - General (Internal Medicine) Nahser, Wonda Cheng, MD as PCP - Cardiology (Cardiology) Nahser, Wonda Cheng, MD as Consulting Physician (Cardiology)  This Provider for this visit: Treatment Team:  Attending Provider: Brand Males, MD    01/23/2022 -   Chief Complaint  Patient presents with   Follow-up    Pt states she has gotten better since last visit and denies any current complaints.   Asthma follow-up  HPI Marlynn Akkerman 88 y.o. -returns for follow-up.  I stopped the [place] but then over the summer she started having a lot of respiratory exacerbations.  Then Patricia Nettle restarted her Dupixent.  She says after that she is significantly better.  She is only doing low-dose Dupixent.  Her ACT score is significantly improved.  She is not doing her Symbicort as yet.  She wants to start her Symbicort with a fall allergy season coming up.  She is continuing her other medications.  She is grateful to [place].  She tells me that in December 2022 she had COVID from her husband and since then she has had fatigue but Tammy did notice an interim chest x-ray to be clear.  She cannot do the flu shot because of egg allergy.  By extension.  She does not want to do the RSV vaccine.  She did do 1 [place] and that gave her blood clot and so she does not want to do any of the COVID vaccines either.  No other issues.     OV 04/24/2022  Subjective:  Patient ID: [name], female , DOB: March 03, 1933 , age 31 y.o. , MRN: [id number] , ADDRESS: [address] [place] [address] PCP Crist Infante, MD Patient Care Team: Crist Infante, MD as PCP - General (Internal Medicine) Nahser, Wonda Cheng, MD as PCP - Cardiology (Cardiology) Nahser, Wonda Cheng,  MD as Consulting Physician (Cardiology)  This Provider for this visit: Treatment Team:  Attending Provider: Brand Males, MD    04/24/2022 -   Chief Complaint  Patient presents with   Follow-up    Pt states she has been doing okay since last visit and denies any complaints.     HPI Aja Soffer 88 y.o. -returns for asthma follow-up.  She is now on low-dose Dupixent.  She is also on Symbicort.  She is also on Singulair.  She is also on allergy medicines.  She is doing well.  Asthma is well-controlled.  Just occasional cough.  Most recent chest x-ray April 2023 was normal.  ACT control score is 22.  She has a allergy and therefore will not do the vaccines.  She is getting ready for Christmas.  She plans to  cook for her 75 year old brother and a 22 year old?  Sister and a disabled cousin.  She lives with her husband.  No children.    Asthma Control Panel  10/11/15 - IgE - 425 , rest of blood allergy panel negative. Blood eos 300cells (al;so 300 on 12/19/16). CT Sinus feb 2018 - chronic pan sinusitis     07/02/2016 06/15/2017  08/13/2017  12/01/2017  06/08/2018  01/10/2020  12/26/2020  07/09/2021  June 2023 01/23/2022 Back on duppixen 04/24/2022   Current Med Regimen  Spriva, symbicort,  Singulair, xolair, nexium same Spiriva, Symbicort, Singulair, Nexium and dupulimab Spiriva, Symbicort, Singulair, Nexium and dupulimab        Act - ACT - a GSK test - 4 week. Total score. Max is 25, Lower score is worse.  <19 = poor control      '19 16 15 '$ 8-> '17 22 22  '$ ACQ 5 point- 1 week. wtd avg score. <1.0 is good control 0.75-1.25 is grey zone. >1.25 poor control. Delta 0.5 is clinically meaningful   2.4 0.6 2.9        FeNO ppB  126 ppb 104 ppb 43 19        FeV1  1.777/90%, ratio 74,             Planned intervention  for visit   HRCT, talk to eye doc and if ok start dupixent, augmenting, prednsione,              PFT     Latest Ref Rng & Units 09/06/2021    8:31 AM 07/02/2016   10:21 AM  01/16/2016    9:29 AM 10/31/2015    8:31 AM 08/03/2015    9:43 AM  PFT Results  FVC-Pre L 2.37  2.39  2.26  1.88  2.29   FVC-Predicted Pre % 97  90  84  70  85   FVC-Post L 2.36  2.51  2.57  2.05  2.53   FVC-Predicted Post % 96  95  96  76  94   Pre FEV1/FVC % % 74  74  68  67  71   Post FEV1/FCV % % 76  77  76  70  72   FEV1-Pre L 1.74  1.77  1.54  1.26  1.63   FEV1-Predicted Pre % 96  90  78  63  81   FEV1-Post L 1.80  1.93  1.95  1.44  1.82   DLCO uncorrected ml/min/mmHg 14.55     17.59   DLCO UNC% % 76     69   DLCO corrected ml/min/mmHg 14.55     18.36   DLCO COR %Predicted % 76     72   DLVA Predicted % 88     97   TLC L 4.95     5.97   TLC % Predicted % 95     115   RV % Predicted % 105     143        has a past medical history of Anemia, Angio-edema, Asthma, severe persistent, Cancer (HCC), Chronic kidney disease, stage II (mild), DDD (degenerative disc disease), cervical, Diverticulitis, Diverticulosis (yrs ago), Edema, lower extremity, GERD (gastroesophageal reflux disease), Headache, Heart murmur, History of breast cancer (1990 left mastectomy also), History of DVT of lower extremity (5 yrs ago), History of shingles, Hyperlipidemia, Internal hemorrhoid, Leg ulcer, left ([place]), Osteoporosis, unspecified, Perennial allergic rhinitis, Peripheral vascular disease ([place]), Pneumonia, Restless legs syndrome (RLS), Rheumatoid arthritis ([place]), Thyroid nodule,  Unspecified essential hypertension, and Urticaria.   reports that she has never smoked. She has never used smokeless tobacco.  Past Surgical History:  Procedure Laterality Date   CARDIAC CATHETERIZATION  08/ 01/ 2008   dr Cathie Olden   normal -- EF of 65%   CARDIOVASCULAR STRESS TEST  05-22-2011   dr Cathie Olden   normal lexiscan perfusion study/  no ischemia/  lvsf  86%   CATARACT EXTRACTION W/ INTRAOCULAR LENS  IMPLANT, BILATERAL     CHOLECYSTECTOMY  1993   laparoscopic   CONVERSION TO TOTAL HIP Left 12/03/2017   Procedure: CONVERSION  TO LEFT TOTAL HIP;  Surgeon: Paralee Cancel, MD;  Location: [facility];  Service: Orthopedics;  Laterality: Left;  90 mins   HIP ARTHROPLASTY Left 12/22/2016   Procedure: HEMIARTHROPLASTY, LEFT;  Surgeon: Paralee Cancel, MD;  Location: [facility];  Service: Orthopedics;  Laterality: Left;   INCISION AND DRAINAGE OF WOUND Left 10/24/2013   Procedure: IRRIGATION AND DEBRIDEMENT OF LEFT LEG WITH PLACEMENT OF ACELL AND WOUND [place];  Surgeon: Theodoro Kos, DO;  Location: [facility];  Service: Plastics;  Laterality: Left;   KNEE ARTHROPLASTY     LUMBAR [place] Bilateral rigth 1989///   left  1990   breast cancer 1989//   fibrocytic disease 1990   ORIF HUMERUS FRACTURE Right 09/21/2018   Procedure: OPEN REDUCTION INTERNAL FIXATION (ORIF) PROXIMAL HUMERUS FRACTURE;  Surgeon: Nicholes Stairs, MD;  Location: [facility];  Service: Orthopedics;  Laterality: Right;  90 mins   TOTAL ABDOMINAL HYSTERECTOMY W/ BILATERAL SALPINGOOPHORECTOMY  1989   done at same time as right mastectomy   TOTAL HIP ARTHROPLASTY Right 04/02/2021   Procedure: TOTAL HIP ARTHROPLASTY ANTERIOR APPROACH;  Surgeon: Paralee Cancel, MD;  Location: [facility];  Service: Orthopedics;  Laterality: Right;   TOTAL KNEE ARTHROPLASTY Right 10/05/2012   Procedure: RIGHT TOTAL KNEE ARTHROPLASTY;  Surgeon: Mauri Pole, MD;  Location: [facility];  Service: Orthopedics;  Laterality: Right;    Allergies  Allergen Reactions   Codeine Nausea And Vomiting   Ace Inhibitors     Other reaction(s): cough   Amlodipine     Other reaction(s): too much edemastopped it around 4/17   Egg Phospholipids    Eggs Or Egg-Derived Products     UNSPECIFIED REACTION  RAW EGGS.. Can eat cooked eggs   Ferrous Sulfate     Other reaction(s): hurt stomach.   Hydrocodone Nausea And Vomiting   Influenza Vaccines     UNSPECIFIED REACTION  Egg allergy    Metoprolol     Other reaction(s): cannot sleep on it and i worry about beta blocker  with her asthma   Nucala [Mepolizumab]     UNSPECIFIED REACTION    Telithromycin     Other reaction(s): N/V possibly   Cephalosporins Rash   Gabapentin Nausea Only   Levofloxacin Rash   Pneumococcal Vaccines Rash    Immunization History  Administered Date(s) Administered   Influenza Split 01/24/2009, 06/03/2011, 08/12/2012, 09/05/2014   Janssen (J&J) SARS-COV-2 Vaccination 04/19/2020   Pneumococcal Conjugate-13 09/14/2012   Pneumococcal Polysaccharide-23 01/31/2009, 06/03/2011   Td 06/03/2011   Tdap 09/07/2018   Zoster Recombinat (Shingrix) 04/18/2017, 09/16/2017   Zoster, Live 07/30/2007, 06/03/2011    Family History  Problem Relation Age of Onset   Heart attack Mother    Asthma Mother    Heart disease Mother    Hyperlipidemia Mother    Allergic rhinitis Mother  Heart attack Father    Heart failure Father    Deep vein thrombosis Father    Heart disease Father    Peripheral vascular disease Father        amputation   Asthma Brother    Allergic rhinitis Brother    Asthma Sister    Eczema Sister    Allergic rhinitis Sister    Cancer Brother    Asthma Brother    Coronary artery disease Sister    Heart disease Sister    Asthma Maternal Grandmother    Urticaria Neg Hx      Current Outpatient Medications:    Abaloparatide (TYMLOS) 3120 MCG/1.56ML SOPN, Inject 28mg Subcutaneous each morning, Disp: , Rfl:    albuterol (PROAIR HFA) 108 (90 Base) MCG/ACT inhaler, Inhale 2 puffs into the lungs every 6 (six) hours as needed for wheezing or shortness of breath., Disp: 18 g, Rfl: 5   albuterol (PROVENTIL) (2.5 MG/3ML) 0.083% nebulizer solution, Take 3 mLs (2.5 mg total) by nebulization every 6 (six) hours as needed for wheezing or shortness of breath., Disp: 75 mL, Rfl: 12   budesonide-formoterol (SYMBICORT) 160-4.5 MCG/ACT inhaler, Inhale 2 puffs into the lungs in the morning and at bedtime., Disp: 10.2 g, Rfl: 5   Calcium Carbonate-Vitamin D 600-400 MG-UNIT tablet, Take  1 tablet by mouth daily., Disp: , Rfl:    chlorpheniramine (CHLOR-TRIMETON) 4 MG tablet, Take 4-8 mg by mouth See admin instructions. 4 mg in the morning and 8 mg at bedtime, Disp: , Rfl:    Cholecalciferol (VITAMIN D3) 1000 UNITS tablet, Take 1,000 Units by mouth every evening. , Disp: , Rfl:    dextromethorphan (DELSYM) 30 MG/5ML liquid, Take 60 mg by mouth 2 (two) times daily as needed for cough., Disp: , Rfl:    docusate sodium (COLACE) 100 MG capsule, Take 1 capsule (100 mg total) by mouth 2 (two) times daily., Disp: 10 capsule, Rfl: 0   dupilumab (DUPIXENT) 200 MG/1.14ML prefilled syringe, Inject 200 mg into the skin every 14 (fourteen) days., Disp: 6.84 mL, Rfl: 1   feeding supplement (ENSURE ENLIVE / ENSURE PLUS) LIQD, Take 2317m by mouth twice a day between meals, Disp: , Rfl:    fexofenadine (ALLEGRA) 180 MG tablet, Take 180 mg by mouth daily.  , Disp: , Rfl:    furosemide (LASIX) 20 MG tablet, TAKE 1 TABLET (20 MG TOTAL) BY MOUTH DAILY AS NEEDED FOR FLUID OR EDEMA., Disp: 90 tablet, Rfl: 1   ipratropium (ATROVENT) 0.06 % nasal spray, Place 2 sprays into both nostrils 2 (two) times daily., Disp: 15 mL, Rfl: 6   mirtazapine (REMERON) 15 MG tablet, Take 15 mg by mouth at bedtime., Disp: , Rfl:    mometasone (NASONEX) 50 MCG/ACT nasal spray, USE 2 SPRAYS NASALLY 2 TIMES DAILY, Disp: 17 g, Rfl: 11   montelukast (SINGULAIR) 10 MG tablet, TAKE 1 TABLET BY MOUTH EVERY NIGHT AT BEDTIME, Disp: 90 tablet, Rfl: 2   Multiple Vitamin (MULTIVITAMIN WITH MINERALS) TABS tablet, Take 1 tablet by mouth daily. Centrum Silver Multivitamin, Disp: , Rfl:    multivitamin-lutein (OCUVITE-LUTEIN) CAPS capsule, Take 1 capsule by mouth daily., Disp: , Rfl:    mupirocin ointment (BACTROBAN) 2 %, as needed. , Disp: , Rfl:    nitroGLYCERIN (NITROSTAT) 0.4 MG SL tablet, Place 1 tablet (0.4 mg total) under the tongue every 5 (five) minutes as needed for chest pain., Disp: 25 tablet, Rfl: 3   pantoprazole (PROTONIX) 40  MG tablet, Take 40 mg by  mouth daily., Disp: , Rfl:    potassium chloride (KLOR-CON) 10 MEQ tablet, TAKE 1 TABLET BY MOUTH EVERY DAY, Disp: 90 tablet, Rfl: 2   pramipexole (MIRAPEX) 0.125 MG tablet, Take 0.375 mg by mouth at bedtime., Disp: , Rfl:    rivaroxaban (XARELTO) 10 MG TABS tablet, Take 10 mg by mouth daily., Disp: , Rfl:    rosuvastatin (CRESTOR) 5 MG tablet, TAKE 1 TABLET BY MOUTH EVERYDAY AT BEDTIME, Disp: 90 tablet, Rfl: 2   Spacer/Aero-Holding Chambers (AEROCHAMBER MV) inhaler, by Other route. Use as instructed , Disp: , Rfl:    telmisartan (MICARDIS) 20 MG tablet, Take 20 mg by mouth daily. , Disp: , Rfl:    vitamin B-12 (CYANOCOBALAMIN) 500 MCG tablet, Take 500 mcg by mouth daily.  , Disp: , Rfl:   Current Facility-Administered Medications:    albuterol (PROVENTIL) (2.5 MG/3ML) 0.083% nebulizer solution 2.5 mg, 2.5 mg, Nebulization, Q6H PRN, Magdalen Spatz, NP, 2.5 mg at 10/08/21 0918   albuterol (VENTOLIN HFA) 108 (90 Base) MCG/ACT inhaler 2 puff, 2 puff, Inhalation, Once PRN, Causey, Charlestine Massed, NP   diphenhydrAMINE (BENADRYL) injection 50 mg, 50 mg, Intramuscular, Once PRN, Causey, Charlestine Massed, NP   EPINEPHrine (EPI-PEN) injection 0.3 mg, 0.3 mg, Intramuscular, Once PRN, Delice Bison, Charlestine Massed, NP      Objective:   Vitals:   04/24/22 1021  BP: 138/84  Pulse: 84  SpO2: 97%  Weight: 138 lb 12.8 oz (63 kg)  Height: 5' 4.5" (1.638 m)    Estimated body mass index is 23.46 kg/m as calculated from the following:   Height as of this encounter: 5' 4.5" (1.638 m).   Weight as of this encounter: 138 lb 12.8 oz (63 kg).  '@WEIGHTCHANGE'$ @  [employer]   04/24/22 1021  Weight: 138 lb 12.8 oz (63 kg)     Physical Exam    General: No distress. Looks well Neuro: Alert and Oriented x 3. GCS 15. Speech normal Psych: Pleasant Resp:  Barrel Chest - no.  Wheeze - no, Crackles - no, No overt respiratory distress CVS: Normal heart sounds. Murmurs - no Ext:  Stigmata of Connective Tissue Disease - no HEENT: Normal upper airway. PEERL +. No post nasal drip        Assessment:       ICD-10-CM   1. Severe persistent asthma, unspecified whether complicated  X83.38     2. Vaccine counseling  Z71.85          Plan:     Patient Instructions     ICD-10-CM   1. Severe persistent asthma, unspecified whether complicated  S50.53     2. Vaccine counseling  Z71.85       Well controlled after restart dupixent summer 2023 Chest xray April 2023: clear  plan - continue low dose  Dupixent   = continue Symbicort 2 puffs Twice daily, rinse after use.  Albuterol inhaler or neb as needed.  Mucinex DM Twice daily  As needed  cough/congestion .  Continue on Singulair , Nasonex, and Allegra daily .  Respect no respiratory vaccines due to allergies Venita Sheffield  Plan Follow up with Dr. Chase Caller in 6 months or sooner if heeded Please contact office for sooner follow up if symptoms do not improve or worsen or seek emergency care      SIGNATURE    Dr. Brand Males, M.D., F.C.C.P,  Pulmonary and Critical Care Medicine Staff Physician, [facility] Director - Interstitial Lung Disease  Program  [facility] [place], [place], [address]  Pager: [phone], If no answer or between  15:00h - 7:00h: call [phone] Telephone: [phone]  10:51 AM 04/24/2022

## 2022-04-24 NOTE — Patient Instructions (Addendum)
ICD-10-CM   1. Severe persistent asthma, unspecified whether complicated  W86.16     2. Vaccine counseling  Z71.85       Well controlled after restart dupixent summer 2023 Chest xray April 2023: clear  plan - continue low dose  Dupixent   = continue Symbicort 2 puffs Twice daily, rinse after use.  Albuterol inhaler or neb as needed.  Mucinex DM Twice daily  As needed  cough/congestion .  Continue on Singulair , Nasonex, and Allegra daily .  Respect no respiratory vaccines due to allergies Venita Sheffield  Plan Follow up with Dr. Chase Caller in 6 months or sooner if heeded Please contact office for sooner follow up if symptoms do not improve or worsen or seek emergency care

## 2022-04-25 ENCOUNTER — Telehealth: Payer: Self-pay

## 2022-04-25 NOTE — Telephone Encounter (Signed)
Pt received letter from [facility] stating that she would need to re-enroll for 2024, as well as how to do so--renewal form was attached to this letter.  Pt turned in envelope to front desk containing these two documents, however renewal form is entirely blank. Contacted pt and informed need for her signature on the document. Pt verbalized understanding and states she will try to come to the clinic on Monday to sign the document.

## 2022-04-28 NOTE — Telephone Encounter (Signed)
Patient came to clinic and signed forms, submitted Patient Assistance Application to [place] for [place]. Will update patient when we receive a response.  Fax# [phone] Phone# [phone]

## 2022-05-19 ENCOUNTER — Telehealth: Payer: Self-pay | Admitting: Internal Medicine

## 2022-05-19 ENCOUNTER — Other Ambulatory Visit: Payer: Self-pay | Admitting: Acute Care

## 2022-05-19 DIAGNOSIS — R062 Wheezing: Secondary | ICD-10-CM

## 2022-05-19 MED ORDER — PREDNISONE 10 MG PO TABS: ORAL_TABLET | ORAL | 0 refills | Status: AC

## 2022-05-19 NOTE — Telephone Encounter (Signed)
Pred taper sent to preferred pharmacy for pt. Called and spoke with pt letting her know this had been done and she verbalized understanding. Nothing further needed.

## 2022-05-19 NOTE — Telephone Encounter (Signed)
Please take prednisone 40 mg x1 day, then 30 mg x1 day, then 20 mg x1 day, then 10 mg x1 day, and then 5 mg x1 day and stop  

## 2022-05-19 NOTE — Telephone Encounter (Signed)
Spoke with patient she states Wednesday she started getting sick with a productive cough with clear phlegm, nasal and nasal congestion. No fever, sob or wheezing.  Pt has been taking mucinex at night and using her inhalers. She states the mucinex has been helping a little but is requesting prednisone to help.   Dr. Chase Caller please advise?  [facility] [place].

## 2022-05-19 NOTE — Telephone Encounter (Signed)
PT calling today and feels she needs more Pred to finish knocking out her symptoms.  Coughing and congestion.  [facility] [place]

## 2022-06-06 ENCOUNTER — Other Ambulatory Visit: Payer: Self-pay | Admitting: Cardiovascular Disease

## 2022-06-11 NOTE — Telephone Encounter (Signed)
Emailed [place] for update on patient's Dupixent PAP renewal  Knox Saliva, PharmD, MPH, BCPS, CPP Clinical Pharmacist (Rheumatology and Pulmonology)

## 2022-06-13 NOTE — Telephone Encounter (Signed)
Per Dupixent FRM, patient has been reapproved for [place] [employer] patient assistance from 05/12/2022 through 05/12/2023. Called [employer] PAP and rep will refax approval letter to clinic.  Phone: [phone]  Knox Saliva, PharmD, MPH, BCPS, CPP Clinical Pharmacist (Rheumatology and Pulmonology)

## 2022-07-08 ENCOUNTER — Other Ambulatory Visit: Payer: Self-pay | Admitting: Pharmacist

## 2022-07-08 DIAGNOSIS — J4550 Severe persistent asthma, uncomplicated: Secondary | ICD-10-CM

## 2022-07-08 MED ORDER — DUPIXENT 200 MG/1.14ML ~~LOC~~ SOSY: 200.0000 mg | PREFILLED_SYRINGE | SUBCUTANEOUS | 1 refills | Status: DC

## 2022-07-08 NOTE — Telephone Encounter (Signed)
Refill sent for [place] to [facility]: [phone]  Dose: 200 mg sbucut every 2 weeks  Last OV: 04/24/2022 Provider: Dr. Chase Caller  Next OV: due in June 2024 (not yet scheduled)  Knox Saliva, PharmD, MPH, BCPS Clinical Pharmacist (Rheumatology and Pulmonology)

## 2022-07-12 ENCOUNTER — Other Ambulatory Visit: Payer: Self-pay | Admitting: Adult Health

## 2022-08-14 NOTE — Progress Notes (Signed)
Cardiology Office Note:    Date:  08/15/2022  ID:  [name], DOB December 21, 1932, MRN [id number] [facility] Providers Cardiologist:  Kristeen Miss, MD      Patient Profile:   Chest pain  Cath 12/22/2006: no CAD Myoview 06/21/2018: EF 67, no ischemia or infarction; low risk Coronary calcification on CT scan (HFpEF) heart failure with preserved ejection fraction  TTE 12/23/2018: EF 55-60, no RWMA, G1 DD, normal RVSF, mild LAE, mild to moderate MAC, mild calcification of the AV Hypertension  Hyperlipidemia  Asthma Breast CA s/p R mastectomy 1989, L mastectomy 1990 Recurrent DVT, chronic anticoagulation    History of Present Illness:   [name] is a 89 y.o. female who returns for follow-up on HFpEF.  She was last seen by Dr. Elease Hashimoto 08/12/2021.  She is here alone.  She continues to struggle with fatigue from long COVID syndrome.  She has occasional chest discomfort with exertion.  She notes that she has had this symptom for years without significant change.  She can sometimes obtain relief by using her rescue inhaler for asthma.  She rarely takes nitroglycerin.  She sleeps on 2 pillows chronically.  She has not had syncope.  She has mild ankle edema and wears compression hose.  ROS see HPI    Studies Reviewed:    EKG: NSR, HR 80, left bundle branch block, QTc 479, no change from prior tracing   Risk Assessment/Calculations:     HYPERTENSION CONTROL Vitals:   08/15/22 1024 08/15/22 1311  BP: (!) 140/70 (!) 146/70    The patient's blood pressure is elevated above target today.  In order to address the patient's elevated BP: Blood pressure will be monitored at home to determine if medication changes need to be made.          Physical Exam:   VS:  BP (!) 146/70   Pulse 80   Ht 5' 3.5" (1.613 m)   Wt 146 lb 6.4 oz (66.4 kg)   SpO2 97%   BMI 25.53 kg/m    Wt Readings from Last 3 Encounters:  08/15/22 146 lb 6.4 oz (66.4 kg)  04/24/22 138 lb 12.8 oz (63 kg)   01/23/22 134 lb (60.8 kg)    Constitutional:      Appearance: Healthy appearance. Not in distress.  Neck:     Vascular: No carotid bruit. JVD normal.  Pulmonary:     Breath sounds: Normal breath sounds. No wheezing. No rales.  Cardiovascular:     Normal rate. Regular rhythm. Normal S1. Normal S2.      Murmurs: There is a grade 2/6 systolic murmur at the URSB.  Edema:    Ankle: bilateral trace edema of the ankle.    Comments: Compression hose in place Abdominal:     Palpations: Abdomen is soft.       ASSESSMENT AND PLAN:   Chronic heart failure with preserved ejection fraction (HFpEF) EF 55-60 by echo in August 2020.  NYHA IIb-III.  She is mainly limited by deconditioning from long COVID syndrome and asthma.  Volume status appears stable.  Continue Lasix 20 mg daily, K+ 10 mEq daily.  Essential hypertension Blood pressure above goal.  She does admit to eating a diet rich in salt.  I have given her a low-salt diet.  I have asked her to continue to monitor her blood pressure and notify us if it is running 150/90 or higher.  CAD (coronary artery disease), native coronary artery Coronary calcification noted on prior  CT scan.  No CAD on cardiac catheterization in 2008.  Myoview in 2020 was low risk.  She notes a stable pattern of chest discomfort.  Question if she has ANOCA.  We discussed potentially adding into anginal medications to improve her symptoms.  I have recommended starting isosorbide mononitrate.  She would like to hold off on this for now.  She knows to contact us if she changes her mind.  She does not require antiplatelet therapy as she is already on anticoagulation due to her history of recurrent DVT.  Continue Crestor 5 mg daily.  Hyperlipidemia Labs reviewed on K[facility].  LDL in March 2023 was close to goal at 5.  Given her advanced age, I would not adjust her medications further.  Continue Crestor 5 mg daily.  She has labs pending with primary care in the next several  months.  History of DVT of lower extremity She remains on chronic anticoagulation with rivaroxaban 10 mg daily.  This is managed by primary care.  Murmur, cardiac Echocardiogram in 2020 demonstrated aortic valve calcification.  I suspect that this is the cause of her murmur.  I have recommended repeating her echocardiogram to rule out aortic stenosis. Obtain 2D echocardiogram      Dispo:  Return in about 1 year (around 08/15/2023) for Routine Follow Up, w/ Dr. Elease Hashimoto, or Tereso Newcomer, PA-C.  Signed, Tereso Newcomer, PA-C

## 2022-08-15 ENCOUNTER — Encounter: Payer: Self-pay | Admitting: Physician Assistant

## 2022-08-15 ENCOUNTER — Ambulatory Visit: Payer: Medicare Other | Attending: Physician Assistant | Admitting: Physician Assistant

## 2022-08-15 VITALS — BP 146/70 | HR 80 | Ht 63.5 in | Wt 146.4 lb

## 2022-08-15 DIAGNOSIS — E7800 Pure hypercholesterolemia, unspecified: Secondary | ICD-10-CM | POA: Diagnosis not present

## 2022-08-15 DIAGNOSIS — R011 Cardiac murmur, unspecified: Secondary | ICD-10-CM | POA: Diagnosis not present

## 2022-08-15 DIAGNOSIS — I25118 Atherosclerotic heart disease of native coronary artery with other forms of angina pectoris: Secondary | ICD-10-CM

## 2022-08-15 DIAGNOSIS — I10 Essential (primary) hypertension: Secondary | ICD-10-CM | POA: Diagnosis not present

## 2022-08-15 DIAGNOSIS — I5032 Chronic diastolic (congestive) heart failure: Secondary | ICD-10-CM | POA: Diagnosis not present

## 2022-08-15 DIAGNOSIS — Z86718 Personal history of other venous thrombosis and embolism: Secondary | ICD-10-CM

## 2022-08-15 NOTE — Assessment & Plan Note (Signed)
Labs reviewed on KPN.  LDL in March 2023 was close to goal at 2.  Given her advanced age, I would not adjust her medications further.  Continue Crestor 5 mg daily.  She has labs pending with primary care in the next several months.

## 2022-08-15 NOTE — Assessment & Plan Note (Signed)
EF 55-60 by echo in August 2020.  NYHA IIb-III.  She is mainly limited by deconditioning from long COVID syndrome and asthma.  Volume status appears stable.  Continue Lasix 20 mg daily, K+ 10 mEq daily.

## 2022-08-15 NOTE — Assessment & Plan Note (Signed)
Echocardiogram in 2020 demonstrated aortic valve calcification.  I suspect that this is the cause of her murmur.  I have recommended repeating her echocardiogram to rule out aortic stenosis. Obtain 2D echocardiogram

## 2022-08-15 NOTE — Assessment & Plan Note (Signed)
Coronary calcification noted on prior CT scan.  No CAD on cardiac catheterization in 2008.  Myoview in 2020 was low risk.  She notes a stable pattern of chest discomfort.  Question if she has ANOCA.  We discussed potentially adding into anginal medications to improve her symptoms.  I have recommended starting isosorbide mononitrate.  She would like to hold off on this for now.  She knows to contact us if she changes her mind.  She does not require antiplatelet therapy as she is already on anticoagulation due to her history of recurrent DVT.  Continue Crestor 5 mg daily.

## 2022-08-15 NOTE — Assessment & Plan Note (Signed)
She remains on chronic anticoagulation with rivaroxaban 10 mg daily.  This is managed by primary care.

## 2022-08-15 NOTE — Assessment & Plan Note (Signed)
Blood pressure above goal.  She does admit to eating a diet rich in salt.  I have given her a low-salt diet.  I have asked her to continue to monitor her blood pressure and notify us if it is running 150/90 or higher.

## 2022-08-15 NOTE — Patient Instructions (Signed)
Medication Instructions:  Your physician recommends that you continue on your current medications as directed. Please refer to the Current Medication list given to you today.  *If you need a refill on your cardiac medications before your next appointment, please call your pharmacy*   Lab Work: None ordered  If you have labs (blood work) drawn today and your tests are completely normal, you will receive your results only by: MyChart Message (if you have MyChart) OR A paper copy in the mail If you have any lab test that is abnormal or we need to change your treatment, we will call you to review the results.   Testing/Procedures: Your physician has requested that you have an echocardiogram. Echocardiography is a painless test that uses sound waves to create images of your heart. It provides your doctor with information about the size and shape of your heart and how well your heart's chambers and valves are working. This procedure takes approximately one hour. There are no restrictions for this procedure. Please do NOT wear cologne, perfume, aftershave, or lotions (deodorant is allowed). Please arrive 15 minutes prior to your appointment time.    Follow-Up: At [facility], you and your health needs are our priority.  As part of our continuing mission to provide you with exceptional heart care, we have created designated Provider Care Teams.  These Care Teams include your primary Cardiologist (physician) and Advanced Practice Providers (APPs -  Physician Assistants and Nurse Practitioners) who all work together to provide you with the care you need, when you need it.  We recommend signing up for the patient portal called "MyChart".  Sign up information is provided on this After Visit Summary.  MyChart is used to connect with patients for Virtual Visits (Telemedicine).  Patients are able to view lab/test results, encounter notes, upcoming appointments, etc.  Non-urgent messages can be  sent to your provider as well.   To learn more about what you can do with MyChart, go to [email or web address].    Your next appointment:   1 year(s)  Provider:   Kristeen Miss, MD     Other Instructions Call if your blood pressure is 150-90 or higher 3 times or more   Low-Sodium Eating Plan Sodium, which is an element that makes up salt, helps you maintain a healthy balance of fluids in your body. Too much sodium can increase your blood pressure and cause fluid and waste to be held in your body. Your health care provider or dietitian may recommend following this plan if you have high blood pressure (hypertension), kidney disease, liver disease, or heart failure. Eating less sodium can help lower your blood pressure, reduce swelling, and protect your heart, liver, and kidneys. What are tips for following this plan? Reading food labels The Nutrition Facts label lists the amount of sodium in one serving of the food. If you eat more than one serving, you must multiply the listed amount of sodium by the number of servings. Choose foods with less than 140 mg of sodium per serving. Avoid foods with 300 mg of sodium or more per serving. Shopping  Look for lower-sodium products, often labeled as "low-sodium" or "no salt added." Always check the sodium content, even if foods are labeled as "unsalted" or "no salt added." Buy fresh foods. Avoid canned foods and pre-made or frozen meals. Avoid canned, cured, or processed meats. Buy breads that have less than 80 mg of sodium per slice. Cooking  Eat more home-cooked food and less restaurant,  buffet, and fast food. Avoid adding salt when cooking. Use salt-free seasonings or herbs instead of table salt or sea salt. Check with your health care provider or pharmacist before using salt substitutes. Cook with plant-based oils, such as canola, sunflower, or olive oil. Meal planning When eating at a restaurant, ask that your food be prepared with less  salt or no salt, if possible. Avoid dishes labeled as brined, pickled, cured, smoked, or made with soy sauce, miso, or teriyaki sauce. Avoid foods that contain MSG (monosodium glutamate). MSG is sometimes added to [place] food, bouillon, and some canned foods. Make meals that can be grilled, baked, poached, roasted, or steamed. These are generally made with less sodium. General information Most people on this plan should limit their sodium intake to 1,500-2,000 mg (milligrams) of sodium each day. What foods should I eat? Fruits Fresh, frozen, or canned fruit. Fruit juice. Vegetables Fresh or frozen vegetables. "No salt added" canned vegetables. "No salt added" tomato sauce and paste. Low-sodium or reduced-sodium tomato and vegetable juice. Grains Low-sodium cereals, including oats, puffed wheat and rice, and shredded wheat. Low-sodium crackers. Unsalted rice. Unsalted pasta. Low-sodium bread. Whole-grain breads and whole-grain pasta. Meats and other proteins Fresh or frozen (no salt added) meat, poultry, seafood, and fish. Low-sodium canned tuna and salmon. Unsalted nuts. Dried peas, beans, and lentils without added salt. Unsalted canned beans. Eggs. Unsalted nut butters. Dairy Milk. Soy milk. Cheese that is naturally low in sodium, such as ricotta cheese, fresh mozzarella, or Swiss cheese. Low-sodium or reduced-sodium cheese. Cream cheese. Yogurt. Seasonings and condiments Fresh and dried herbs and spices. Salt-free seasonings. Low-sodium mustard and ketchup. Sodium-free salad dressing. Sodium-free light mayonnaise. Fresh or refrigerated horseradish. Lemon juice. Vinegar. Other foods Homemade, reduced-sodium, or low-sodium soups. Unsalted popcorn and pretzels. Low-salt or salt-free chips. The items listed above may not be a complete list of foods and beverages you can eat. Contact a dietitian for more information. What foods should I avoid? Vegetables Sauerkraut, pickled vegetables, and  relishes. Olives. [place] fries. Onion rings. Regular canned vegetables (not low-sodium or reduced-sodium). Regular canned tomato sauce and paste (not low-sodium or reduced-sodium). Regular tomato and vegetable juice (not low-sodium or reduced-sodium). Frozen vegetables in sauces. Grains Instant hot cereals. Bread stuffing, pancake, and biscuit mixes. Croutons. Seasoned rice or pasta mixes. Noodle soup cups. Boxed or frozen macaroni and cheese. Regular salted crackers. Self-rising flour. Meats and other proteins Meat or fish that is salted, canned, smoked, spiced, or pickled. Precooked or cured meat, such as sausages or meat loaves. Tomasa Blase. Ham. Pepperoni. Hot dogs. Corned beef. Chipped beef. Salt pork. Jerky. Pickled herring. Anchovies and sardines. Regular canned tuna. Salted nuts. Dairy Processed cheese and cheese spreads. Hard cheeses. Cheese curds. Blue cheese. Feta cheese. String cheese. Regular cottage cheese. Buttermilk. Canned milk. Fats and oils Salted butter. Regular margarine. Ghee. Bacon fat. Seasonings and condiments Onion salt, garlic salt, seasoned salt, table salt, and sea salt. Canned and packaged gravies. Worcestershire sauce. Tartar sauce. Barbecue sauce. Teriyaki sauce. Soy sauce, including reduced-sodium. Steak sauce. Fish sauce. Oyster sauce. Cocktail sauce. Horseradish that you find on the shelf. Regular ketchup and mustard. Meat flavorings and tenderizers. Bouillon cubes. Hot sauce. Pre-made or packaged marinades. Pre-made or packaged taco seasonings. Relishes. Regular salad dressings. Salsa. Other foods Salted popcorn and pretzels. Corn chips and puffs. Potato and tortilla chips. Canned or dried soups. Pizza. Frozen entrees and pot pies. The items listed above may not be a complete list of foods and beverages you should avoid.  Contact a dietitian for more information. Summary Eating less sodium can help lower your blood pressure, reduce swelling, and protect your heart, liver,  and kidneys. Most people on this plan should limit their sodium intake to 1,500-2,000 mg (milligrams) of sodium each day. Canned, boxed, and frozen foods are high in sodium. Restaurant foods, fast foods, and pizza are also very high in sodium. You also get sodium by adding salt to food. Try to cook at home, eat more fresh fruits and vegetables, and eat less fast food and canned, processed, or prepared foods. This information is not intended to replace advice given to you by your health care provider. Make sure you discuss any questions you have with your health care provider. Document Revised: 04/04/2019 Document Reviewed: 03/30/2019 Elsevier Patient Education  2023 [employer]

## 2022-08-16 ENCOUNTER — Other Ambulatory Visit: Payer: Self-pay | Admitting: Cardiovascular Disease

## 2022-08-18 NOTE — Telephone Encounter (Signed)
Rx refill sent to pharmacy. 

## 2022-09-02 ENCOUNTER — Other Ambulatory Visit: Payer: Self-pay | Admitting: Cardiovascular Disease

## 2022-09-17 ENCOUNTER — Ambulatory Visit (HOSPITAL_COMMUNITY): Payer: Medicare Other | Attending: Cardiology

## 2022-09-17 DIAGNOSIS — I10 Essential (primary) hypertension: Secondary | ICD-10-CM

## 2022-09-17 DIAGNOSIS — R011 Cardiac murmur, unspecified: Secondary | ICD-10-CM

## 2022-09-17 DIAGNOSIS — I5032 Chronic diastolic (congestive) heart failure: Secondary | ICD-10-CM | POA: Diagnosis not present

## 2022-09-17 LAB — ECHOCARDIOGRAM COMPLETE
Area-P 1/2: 3.21 cm2
S' Lateral: 2.4 cm

## 2022-09-29 ENCOUNTER — Other Ambulatory Visit: Payer: Self-pay | Admitting: Cardiovascular Disease

## 2022-12-15 ENCOUNTER — Telehealth: Payer: Self-pay | Admitting: Pharmacist

## 2022-12-15 DIAGNOSIS — J4550 Severe persistent asthma, uncomplicated: Secondary | ICD-10-CM

## 2022-12-15 MED ORDER — DUPIXENT 200 MG/1.14ML ~~LOC~~ SOSY
200.0000 mg | PREFILLED_SYRINGE | SUBCUTANEOUS | 0 refills | Status: AC
Start: 2022-12-15 — End: ?

## 2022-12-15 NOTE — Telephone Encounter (Signed)
Received refill request from [facility] for Dupixent. Patient overdue for f/u appt with Dr. Marchelle Gearing  Last OV on 04/24/22 with MR with anticipated following up in June 2024  Routing to scheduling team. Further refills will be sent after OV  Chesley Mires, PharmD, MPH, BCPS, CPP Clinical Pharmacist (Rheumatology and Pulmonology)

## 2022-12-16 ENCOUNTER — Ambulatory Visit: Payer: Medicare Other | Admitting: Internal Medicine

## 2022-12-16 ENCOUNTER — Encounter: Payer: Self-pay | Admitting: Internal Medicine

## 2022-12-16 ENCOUNTER — Other Ambulatory Visit: Payer: Self-pay | Admitting: Pharmacist

## 2022-12-16 VITALS — BP 118/60 | HR 79 | Ht 63.5 in | Wt 139.8 lb

## 2022-12-16 DIAGNOSIS — J4550 Severe persistent asthma, uncomplicated: Secondary | ICD-10-CM | POA: Diagnosis not present

## 2022-12-16 DIAGNOSIS — R5383 Other fatigue: Secondary | ICD-10-CM

## 2022-12-16 DIAGNOSIS — R0609 Other forms of dyspnea: Secondary | ICD-10-CM | POA: Diagnosis not present

## 2022-12-16 DIAGNOSIS — Z7185 Encounter for immunization safety counseling: Secondary | ICD-10-CM

## 2022-12-16 MED ORDER — DUPIXENT 200 MG/1.14ML ~~LOC~~ SOSY
200.0000 mg | PREFILLED_SYRINGE | SUBCUTANEOUS | 2 refills | Status: DC
Start: 2022-12-16 — End: 2023-09-21

## 2022-12-16 NOTE — Progress Notes (Signed)
C.C.:  Follow-up for Severe, Persistent Asthma, Chronic Allergic Rhinitis, DVT, & GERD w/ Hiatal Hernia.  HPI Patient has been seen twice in our office since her last appointment with me. Last visit was on 9/26 with nurse practitioner. Patient was treated for an exacerbation of her asthma with bronchitis. She was given a prednisone taper, instructed to use Mucinex twice daily as needed, and a 7 day course of Augmentin. Since her last appointment she fractured her left hip. She is continuing to walk with a cane. She reports no adverse effects from her Xolair. She does feel the Xolair is helping her significantly. Hasn't needed her rescue inhaler significantly in the last few weeks. Only rare nocturnal awakenings with any dyspnea.   Severe, persistent asthma: Patient currently maintained on a regimen of Xolair, Singulair, Spiriva, and Symbicort. She feels her dyspnea has significantly improved. She still has a scratchy throat & hoarse voice. She has her baseline nonproductive cough. No wheezing.  Chronic allergic rhinitis: Previously referred to ENT. Regimen at last appointment with me included Allegra, Singulair, Nasonex, and Xolair. Patient has had multiple different antibiotic courses for recurrent sinusitis. Only having intermittent sinus congestion & drainage.   DVT: Present in bilateral lower extremities. Patient on systemic anticoagulation with Xarelto indefinitely. No melena or hematochezia. No hematuria.   GERD with hiatal hernia: Previously prescribed Nexium. No reflux or dyspepsia. No morning brash water taste.   Review of Systems  No chest pain or tightness. No fever or chills. No abdominal pain or nausea.    OV 06/15/2017  Chief Complaint  Patient presents with   Follow-up    flare ups with asthma when the weather changes.  Right now it is a cough, with congestions and wheezing. Non-productive dry cough. SOB with any activity.  Hoarseness.   89 year old female.  Transfer of  care from Dr. Jamison Neighbor who is left the practice.  Moderate persistent asthma with an elevated IgE.  She is here with her husband.  She tells me that in 2015-2017 she was started on Xolair and then subsequently switched in [place] last year or year before last.  This then caused nonspecific side effect such as erythema in her face and nonspecific pain that reminded her of shingles.  Therefore she discontinued this and the side effects resolved.  She did have 6 doses of the interleukin-5 receptor antibody.  She went back on Xolair.  Overall she feels that since starting Xolair her quality of life and asthma exacerbations have improved but nevertheless she still gets asthma exacerbations every few months and has to go on prednisone.  Currently she states that for the last few to several days she has had increased cough although no change in sputum.  She is also wheezing a little bit more than usual.  Asthma control questionnaire shows for the last week she is waking up a few times at night.  When she wakes up she has moderate symptoms.  Her activities are slightly limited because of asthma.  She is moderate amount of shortness of breath because of asthma and she is wheezing a little of the time and using albuterol for rescue at least 1 or 2 times daily.  Exam nitric oxide is significantly elevated at 125 ppb.  She think she will benefit from prednisone  She she reports to me for the first time a new issue of left anterior cervical lymph node/submandibular lymph node.  She says it is been present for a while but  other physicians were examined it have not felt it.  She asked me to examine it.  She feels it is slowly growing.  Insidious onset for a year.   OV 08/13/2017  Chief Complaint  Patient presents with   Follow-up    Pt states she has had many flare-ups with her asthma. States she has had a lot of sinus drainage and congestion and a lot of chest tightness.    89 year old female with complicated asthma    poor control of asthma continues.  At my last visit she did a prednisone taper.  Then a few weeks ago her sinuses acted up and therefore her asthma acted up and she did another prednisone taper on herself at home but it was just for a few days.  She continues on Spiriva, Symbicort, Singulair, Nexium and Xolair injections.  She feels that ever since Dr. Shan Levans retired her asthma has been out of control.  Currently for the last few weeks her sinuses have been acting up and she wants antibiotics.  In particular she wants Augmentin.  She says in the past this was deemed as an allergy but she went and saw some doctor in [place] and it looks like she has been desensitized.  Penicillin is no longer listed as an allergy.  She insists that she wants Augmentin.  Review of the chart shows she has had chronic sinusitis and a CT scan of the sinus 1 year ago in February 2018.  She says she has seen ENT for this.  No surgery has been recommended.  Just nasal hygiene and antibiotic courses.  Currently her symptoms are quite significant with asthma control question of greater than 2 it appears this might all just be her baseline.  Her exam nitric oxide is still high over 100.  At night she is waking up several times.  When she wakes up she has moderate symptoms.  Activities are slightly limited because of asthma.  She has moderate amount of shortness of breath because of asthma.  She is wheezing hardly and she uses albuterol for rescue at least 1 or 2 times daily.   OV  Chief Complaint  Patient presents with   Follow-up    Pt states her breathing has been much better since she began new injection but states she is coughing more than before. Denies any CP.    Follow-up severe persistent asthma with eosinophilia: In April 2019 we started dupulimab and stop the Xolair. With this asthma control is significantly better. Her asthma control questionnaire average score has drop to 0.6. Her exhaled nitric oxide is  drop from 100s to 43. She says she is much less short of breath and is able to do a lot more things. She is very pleased with the new injectable. However she feels that her baseline chronic cough is slightly worse. It is moderate in intensity is dry. It does not wake up at night. It is not associated with wheezing or shortness of breath. It is associated with Spiriva MDI. She does clear her throat. Coughing is made worse by talking. Off note, she has new issue of left hip surgery coming up in 2 days. She feels from a pulmonary standpoint she will be stable to do the surgery because of prior experience with surgery.        OV 03/08/2018  Subjective:  Patient ID: [name], female , DOB: 02-19-1933 , age 54 y.o. , MRN: [id number] , ADDRESS: [address] [place] [address]  03/08/2018 -   Chief Complaint  Patient presents with   Follow-up    Sinus draniage and more cough    HPI [name] 89 y.o. -returns for asthma follow-up.  However the issue today is that she reports worsening of sinus drainage for the last 1 month ever since the fall weather change.  He says the sinus drainage is so significant that it is making her cough.  She feels asthma itself is under control.  In fact exam nitric oxide today is 38.  She is continuing inhalers Singulair Nexium and injection dupilumab.  She refused flu shot because she is allergic to it.  Review of the chart indicates March 2018 Dr. Jamison Neighbor did a CT scan of the sinus and it showed pansinusitis.  She is says she is seen ENT from 2 years ago and is unclear if it was before the scan or after the scan.  At this point in time she just wants relief from her current sinus issue.  The sinus drainage is possibly clear but she has not looked at it.  There is no fever worsening wheeze or chest tightness.  05/17/2018 Patient presents today with acute complaints of cough and congestion. Accompanied by husband. Productive cough with clear mucus since  05/05/18. Husband and brother were both sick. She was seen at [facility] on 12/29 and given steroid injection and amoxicillin course. Finished antibiotic 2 days ago. Did not notice much of an improvement, if any. She has taken mucinex and delsym for cough. Hasn't had to use rescue inhaler or nebulizer. Due for Dupixent injection on 05/25/18. Did not receive flu injection d/t allergy. Afebrile.   05/25/2018 Patient presents for 1 week follow-up for asthma exacerbation. CXR showed no acute cardiopulmonary disease. Treated with prednisone taper. Due for dupixent injection today. Feeling better than she did last week, still has a cough but sputum has cleared. Felt better yesterday, rain made her symptoms worse. States anytime the weather changes her asthma flares. Using nebulizer as needed, didn't use it as much yesterday. Required a treatment this morning. Takes Singulair and allegra. Afebrile.       OV 06/08/2018  Subjective:  Patient ID: [name], female , DOB: [birth year] , age 28 y.o. , MRN: [id number] , ADDRESS: [address] [place] [address]   06/08/2018 -   Chief Complaint  Patient presents with   Follow-up    Pt states she has had sinus problems, cough with clear mucus, and has also had some chest pain/tightness. Pt has been seen by Buelah Manis for asthma exacerbations a couple different times. Pt denies any fever.   Severe asthma on Dupixent.  Also with chronic sinusitis  HPI [name] 89 y.o. -returns for follow-up of her issues.  I last saw her in October 2019.  After that earlier this month she came to see nurse practitioner 2 times for acute on chronic sinus issues.  She tells me that she is tolerating her Dupixent injections just fine.  She feels it is helping her.  However, she is bothered by sinus issues this constantly sinus drainage particularly flared up in January 2020.  She recollects seeing Dr. Jenne Pane in ENT.  At the time we recommended amoxicillin but she was allergic  to it and no antibiotic therefore was prescribed according to history.  Since then she has seen an allergist in her local area and is been cleared to get amoxicillin.  She is okay seeing her ENT but she wants to change to  her husband's ENT surgeon Dr. Dallie Piles.  Early this month with a sinus flush she got antibiotic and prednisone this helped her.  However she still has significant amount of residual symptoms.  Asthma control question is 2.8 but the nitric oxide level is normal.  This was excessive symptom burden.  She is waking up several times at night.  When she wakes up she has moderate symptoms she is moderately limited in her activities.  Moderate amount of shortness of breath and wheezing a little of the time and using albuterol for rescue at least 1 time daily.  She says is unimproved symptomatology because of the recent antibiotic and prednisone.  She feels she needs another course of antibiotic and prednisone as well.  In addition with the cough she is complaining of musculoskeletal pain in the upper chest and the back.  She feels the lungs are hurting.  There is no radiation.  The course is improving.  Of note, her past medical history lists her as having rheumatoid arthritis but she denies that to be the case.  She says she has osteoarthritis.  She does not see a rheumatologist.  She has had no prior immune work-up.    OV 02/28/2019  Subjective:  Patient ID: [name], female , DOB: [birth year] , age 89 y.o. , MRN: [id number] , AD[address]ESS: [address] [place] [address]   02/28/2019 -   Chief Complaint  Patient presents with   Follow-up    She reports her breathing has been at her baseline.Allergy to eggs, no flu shot. Reports her dupixent has been great.      HPI [name] 89 y.o. -  Follow-up chronic severe persistent asthma: She is now on Dupixent, Symbicort, Spiriva and Singulair.  She says her asthma is well under control.  Albuterol rescue use is rare.  No  nocturnal awakenings.  She has deferred flu shot because she states "I cannot take it"   Follow-up chronic sinusitis: She has seen ENT Dr. Jodean Lima and has been prescribed Nasonex.  This is now under control.  New issue of ANA positivity 1: 1280 -in January 2020 because of chronic sinusitis I have ordered autoimmune and vasculitis profile.  Her ANCA profile is negative.  However her ANA is strongly positive.  She admits to having osteoarthritis and osteoporosis.  She denies she has rheumatoid arthritis although this is mentioned in her chart as past medical history.  She denies having seen a rheumatologist.  Review of the chart does not indicate she has had rheumatoid factor tested with an health system.    ROS - per HPI   OV 07/11/2019  Subjective:  Patient ID: [name], female , DOB: [birth year] , age 89 y.o. , MRN: [id number] , ADDRESS: [address] [place] [address]   07/11/2019 -   Chief Complaint  Patient presents with   Follow-up    Pt states she has been doing good since last visit. States she does have an occ cough.     HPI [name] 89 y.o. -  Follow-up chronic severe asthma: Last seen in October 2020.  Since then she continues with her durvalumab on a scheduled basis.  She visits our office for that.  However she has become less compliant with the Symbicort.  She for the month of February 2021 she is only taken it 2 times.  She also takes Spiriva only rarely.  She takes these 2 on a as needed basis.  She is compliant with her Singulair  on a daily basis.  Albuterol is also as needed.  Today because of the weather changes rainy and warm she feels a remote tightness in her chest.  We do not have exam nitric oxide test.  Asthma control question is 0.8 and is at the upper limit of good control.  She feels the Dupixent has worked tremendously well for her  In terms of her chronic sinusitis: She follows with ENT and takes Nasonex   In terms of her strong positivity for  ANA: Last visit check extended autoimmune profile.  SSA was positive.  She then had a televisit in December 2020 with Dr. Algis Downs.  I reviewed that note.  She has a face-to-face visit coming up in March 2021.  She does admit to dryness of the eyes.  She might have Sjogren's and I informed this to her.   In terms of vaccine counseling.  She has had allergy to flu shot.  She has other allergies documented below.  She does not want to do the COVID-19 vaccine.  She is social distancing.  She does outdoor church.  She wants to know when life will return to normal.  We discussed summer is a possibility.   OV 01/10/2020   Subjective:  Patient ID: [name], female , DOB: 1932-10-19, age 24 y.o. years. , MRN: [id number],  ADDRESS: [address] [place] [address] PCP  Rodrigo Ran, MD Providers : Treatment Team:  Attending Provider: Kalman Shan, MD   Chief Complaint  Patient presents with   Follow-up    Severe Asthma       HPI [facility] 89 y.o. -returns for follow-up of asthma.  Since her last visit she still remains Covid unvaccinated.  She is scared of the Covid vaccine because of egg allergy.  Because of egg allergy she normally does not do flu shot.  She has done other vaccines.  Asthma continues to be stable.  She is taking Dupixent at full dose every 2 weeks in our office.  She does not want to do this at home.  She does not want to reduce the dose.  She also takes Symbicort at 160/4.52 puff 2 times daily.  She is willing to reduce the dose for this ACT control score is 19 showing good control.  She recently relocated her sister who is 64 years old and has been doing chores.  She is trying to enjoy her [place] in  [place].  Of note even though ACT score is 19 and suggest poor control she tells me she barely ever wakes up in the middle of the night from asthma.  She is not using her albuterol for rescue.  She feels asthma is well  controlled.    OV 08/08/2020  Subjective:  Patient ID: [name], female , DOB: 1932/06/29 , age 85 y.o. , MRN: [id number] , ADDRESS: [address] [place] [address] PCP Rodrigo Ran, MD Patient Care Team: Rodrigo Ran, MD as PCP - General (Internal Medicine) Nahser, Deloris Ping, MD as PCP - Cardiology (Cardiology) Nahser, Deloris Ping, MD as Consulting Physician (Cardiology)  This Provider for this visit: Treatment Team:  Attending Provider: Kalman Shan, MD    08/08/2020 -   Chief Complaint  Patient presents with   Follow-up    Pt states she has been doing good since last visit. States she did have a flare up with allergies 2 weeks ago and states that she is a little SOB today.  HPI [name] 89 y.o. - presents for asthma follow-up.  She tells me that 2 weeks ago because of fall in the spring she started having sinus complaints and since then she has had more chest tightness.  Last few days even cough.  Last visit she decreased her Symbicort at 50% of dosage.  She feels she needs to go up on it.  She is compliant with Singulair and Dupixent.  She wants to do Dupixent at home.  Today she is feels that she needs help with the asthma because it is an exacerbation.  There is no fever no nocturnal awakening or chest pain orthopnea proximal nocturnal dyspnea.   OV April 2022  08/17/2020 Follow up : Asthma  Patient presents for a 1 week follow-up.  Patient was seen last week for a follow-up for asthma.  She was having more asthma symptoms with cough and wheezing.  She was given a prednisone taper.  Which she says she finished yesterday.  She is feeling much better.  Symptoms are returning back to her baseline.  She has minimum cough and wheezing.  She denies any fever or discolored mucus.  She remains on maintenance therapy with Symbicort twice daily.  She says she had only been taking 1 puff twice daily and now over the last week is increased back up to 2 puffs twice  daily.  She is on Singulair daily.  And is on Dupixent.  She says since starting Dupixent she has felt so much better with her asthma.  And has had less exacerbations.  Patient is starting home injections soon.  And does endorse that she has a up-to-date EpiPen at home.  OV 12/26/2020  Subjective:  Patient ID: [name], female , DOB: [birth year] , age 24 y.o. , MRN: [id number] , ADDRESS: [address] [place] [address] PCP Rodrigo Ran, MD Patient Care Team: Rodrigo Ran, MD as PCP - General (Internal Medicine) Nahser, Deloris Ping, MD as PCP - Cardiology (Cardiology) Nahser, Deloris Ping, MD as Consulting Physician (Cardiology)  This Provider for this visit: Treatment Team:  Attending Provider: Kalman Shan, MD    12/26/2020 -   Chief Complaint  Patient presents with   Follow-up    Pt states she has a cough she can not get rid of also SOB. Pt states symbicort was not working before running out of medication she not sure if it was because it was outdated.      HPI [name] 89 y.o. -follow-up asthma with sinus drainage.  She continues on Symbicort, Dupixent and Singulair.  She feels her asthma is at baseline even though ACT score shows significant symptoms.  She feels the symptoms are because of postnasal drainage for which she has seen ENT.  She is on nasal steroid, Tessalon cough spells, Chlor-Trimeton, Delsym and Mucinex.  She feels this is resistant to treatment.  This is a chronic problem.  She has seen ENT for this.  She is resigned to this being the baseline.  She says considering everything she is doing well.  She did have specific question about getting EVUSHELD.  She says in December 2020 when she took the [employer].  After that she got blood clot in her left lower extremity.  Review of the medication shows she is on Xarelto.  After that she has not taken mRNA vaccines.  She is nervous about it and she says she is allergic to those.  I do not know  what type of allergy.  She says primary care physician referred her for EVUSHE:D but she says she was declined.  She is asking if it is a good idea.  I did indicate to her that given the fast of time and given the fact that she is reluctant to take the other vaccine and she has had issues with the [employer] vaccine but is probably a good idea to get monoclonal antibody prophylaxis particularly given the recent variatnts.  She is asking me to make a referral.        OV 07/09/2021  Subjective:  Patient ID: [name], female , DOB: 12-Aug-1932 , age 54 y.o. , MRN: [id number] , ADDRESS: [address] [place] [address] PCP Rodrigo Ran, MD Patient Care Team: Rodrigo Ran, MD as PCP - General (Internal Medicine) Nahser, Deloris Ping, MD as PCP - Cardiology (Cardiology) Nahser, Deloris Ping, MD as Consulting Physician (Cardiology)  This Provider for this visit: Treatment Team:  Attending Provider: Kalman Shan, MD    07/09/2021 -   Chief Complaint  Patient presents with   Follow-up     HPI [name] 89 y.o. -asthma follow-up.  Last seen in the summer 2022.  She was on Dupixent and Symbicort and Singulair.  She is also on sinus treatment with different nasal sprays.  After that in November 2022 she fell and fractured her right hip and status post surgery.  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  Then she had COVID early December 2022 for which she was not hospitalized.  All this is resulted in significant weight loss and physical deconditioning.  She is living at home with her husband who is quite active.  She is slowly gaining her strength back.  Through all this since the hospitalization in November 2022 she showed discharge summary where the hospitalist stopped her Symbicort and Dupixent.  She is unclear why.  I read the discharge summary and asthma is not mentioned.  We do not know why this medicine was stopped.  However for the last 1  week she feels because of the COVID recently and also the pollen she is having slightly more respiratory symptoms.  So 1 week ago she restarted her Symbicort and she is beginning to feel better.  Her albuterol rescue use is only 1 time per week.  She is not having any nocturnal symptoms.  We discussed about starting Dupixent right now.  She has a sample with her that will last her till July 2023.  I asked her about the option of not starting Dupixent but just waiting and watching with just Symbicort and Singulair but seeing her rather than shorter interval for follow-up.  She is open to this idea.  We will do some evaluation at that time to see how she is objectively with her asthma.  In the past nitric oxide exhalation test was high    10/16/2021 Follow up : Asthma  Patient returns for a 1 week follow-up.  Patient's been having difficulty with slow to resolve asthmatic bronchitic exacerbation.  Patient has underlying severe persistent asthma with allergic phenotype.  Previously had been on Symbicort and Dupixent.  Dupixent was stopped in the fall 2022.  She has had multiple flares over the last 6 weeks.  Received steroids on several occasions due to increased cough wheezing and shortness of breath.  She underwent pulmonary function testing that showed normal lung function with no airflow obstruction or restriction.  Lab work showed eosinophils slightly elevated with  an absolute count at 300.  Exhaled nitric oxide testing was elevated last visit at 74 ppb.  Chest x-ray in April 2023 showed no acute process. Patient has recently been restarted back on Dupixent at lower dose, has not started yet due to insurance. She has previous doses of Dupixent at home but 300mg  doses.   She remains on Symbicort twice daily and Singulair and allegra daily. Feno today is improving at 59  ppb.   Since last office she is feeling better after finishing another steroid pack . Decreased cough and wheezing .     OV  01/23/2022  Subjective:  Patient ID: [name], female , DOB: Oct 04, 1932 , age 42 y.o. , MRN: [id number] , AD[address]ESS: [address] [place] [address] PCP Rodrigo Ran, MD Patient Care Team: Rodrigo Ran, MD as PCP - General (Internal Medicine) Nahser, Deloris Ping, MD as PCP - Cardiology (Cardiology) Nahser, Deloris Ping, MD as Consulting Physician (Cardiology)  This Provider for this visit: Treatment Team:  Attending Provider: Kalman Shan, MD    01/23/2022 -   Chief Complaint  Patient presents with   Follow-up    Pt states she has gotten better since last visit and denies any current complaints.   Asthma follow-up  HPI [name] 89 y.o. -returns for follow-up.  I stopped the Dupixent but then over the summer she started having a lot of respiratory exacerbations.  Then Rikki Spearing restarted her Dupixent.  She says after that she is significantly better.  She is only doing low-dose Dupixent.  Her ACT score is significantly improved.  She is not doing her Symbicort as yet.  She wants to start her Symbicort with a fall allergy season coming up.  She is continuing her other medications.  She is grateful to Dupixent.  She tells me that in December 2022 she had COVID from her husband and since then she has had fatigue but Tammy did notice an interim chest x-ray to be clear.  She cannot do the flu shot because of egg allergy.  By extension.  She does not want to do the RSV vaccine.  She did do 1 COVID [employer] vaccine and that gave her blood clot and so she does not want to do any of the COVID vaccines either.  No other issues.     OV 04/24/2022  Subjective:  Patient ID: [name], female , DOB: 12-10-1932 , age 64 y.o. , MRN: [id number] , ADDRESS: [address] [place] [address] PCP Rodrigo Ran, MD Patient Care Team: Rodrigo Ran, MD as PCP - General (Internal Medicine) Nahser, Deloris Ping, MD as PCP - Cardiology (Cardiology) Nahser, Deloris Ping,  MD as Consulting Physician (Cardiology)  This Provider for this visit: Treatment Team:  Attending Provider: Kalman Shan, MD    04/24/2022 -   Chief Complaint  Patient presents with   Follow-up    Pt states she has been doing okay since last visit and denies any complaints.     HPI [name] 89 y.o. -returns for asthma follow-up.  She is now on low-dose Dupixent.  She is also on Symbicort.  She is also on Singulair.  She is also on allergy medicines.  She is doing well.  Asthma is well-controlled.  Just occasional cough.  Most recent chest x-ray April 2023 was normal.  ACT control score is 22.  She has a allergy and therefore will not do the vaccines.  She is getting ready for Christmas.  She plans to  cook for her 51 year old brother and a 53 year old?  Sister and a disabled cousin.  She lives with her husband.  No children.     OV 12/16/2022  Subjective:  Patient ID: [name], female , DOB: March 01, 1933 , age 63 y.o. , MRN: [id number] , ADDRESS: [address] [place] [address] PCP Rodrigo Ran, MD Patient Care Team: Rodrigo Ran, MD as PCP - General (Internal Medicine) Nahser, Deloris Ping, MD as PCP - Cardiology (Cardiology) Nahser, Deloris Ping, MD as Consulting Physician (Cardiology)  This Provider for this visit: Treatment Team:  Attending Provider: Kalman Shan, MD    12/16/2022 -   Chief Complaint  Patient presents with   Follow-up    6 months f/up, sob today, cough at times.     HPI [name] 89 y.o. -returns for routine follow-up.  She feels asthma is under control.  She is on Dupixent low-dose.  She also is on Singulair but she says she is only taking it as needed when she feels short of breath.  I did indicate to her that she can just come off it.  It does not release mechanism for dyspnea.  She is on Symbicort.  She does not take respiratory vaccines.  Her main complaint today is that she has got fatigue since her COVID in December  2022.  She got mild chronic kidney disease and she did have mild anemia in the past.  Her primary care physician is not on epic but she did say that she has had her blood work checked and this all stable.  Chest x-ray was last year with primary care physician and clear she is not interested in another chest x-ray.  We talked about referral to pulmonary rehabilitation -initially she was somewhat reluctant but later she did indicate that she will benefit from exercise and was open to hearing from them.  She definitely feels the fatigue is after her COVID and she feels she will benefit from exercise.  She also has baseline shortness of breath.  This is relieved by rest and brought on by exertion.     Asthma Control Panel  10/11/15 - IgE - 425 , rest of blood allergy panel negative. Blood eos 300cells (al;so 300 on 12/19/16). CT Sinus feb 2018 - chronic pan sinusitis     07/02/2016 06/15/2017  08/13/2017  12/01/2017  06/08/2018  01/10/2020  12/26/2020  07/09/2021  June 2023 01/23/2022 Back on duppixen 04/24/2022  12/16/2022   Current Med Regimen  Spriva, symbicort,  Singulair, xolair, nexium same Spiriva, Symbicort, Singulair, Nexium and dupulimab Spiriva, Symbicort, Singulair, Nexium and dupulimab         Act - ACT - a [employer] test - 4 week. Total score. Max is 25, Lower score is worse.  <19 = poor control      19 16 15  8-> 17 22 22 18   ACQ 5 point- 1 week. wtd avg score. <1.0 is good control 0.75-1.25 is grey zone. >1.25 poor control. Delta 0.5 is clinically meaningful   2.4 0.6 2.9         FeNO ppB  126 ppb 104 ppb 43 19         FeV1  1.777/90%, ratio 74,              Planned intervention  for visit   HRCT, talk to eye doc and if ok start dupixent, augmenting, prednsione,                PFT  Latest Ref Rng & Units 09/06/2021    8:31 AM 07/02/2016   10:21 AM 01/16/2016    9:29 AM 10/31/2015    8:31 AM 08/03/2015    9:43 AM  ILD indicators  FVC-Pre L 2.37  2.39  2.26  1.88  2.29   FVC-Predicted Pre  % 97  90  84  70  85   FVC-Post L 2.36  2.51  2.57  2.05  2.53   FVC-Predicted Post % 96  95  96  76  94   TLC L 4.95     5.97   TLC Predicted % 95     115   DLCO uncorrected ml/min/mmHg 14.55     17.59   DLCO UNC %Pred % 76     69   DLCO Corrected ml/min/mmHg 14.55     18.36   DLCO COR %Pred % 76     72       LAB RESULTS last 96 hours No results found.  LAB RESULTS last 90 days Recent Results (from the past 2160 hour(s))  ECHOCARDIOGRAM COMPLETE     Status: None   Collection Time: 09/17/22 10:22 AM  Result Value Ref Range   Area-P 1/2 3.21 cm2   S' Lateral 2.40 cm   Est EF 60 - 65%          has a past medical history of Anemia, Angio-edema, Asthma, severe persistent, Cancer (HCC), Chronic kidney disease, stage II (mild), DDD (degenerative disc disease), cervical, Diverticulitis, Diverticulosis (yrs ago), Edema, lower extremity, GERD (gastroesophageal reflux disease), Headache, Heart murmur, History of breast cancer (1990 left mastectomy also), History of DVT of lower extremity (5 yrs ago), History of shingles, Hyperlipidemia, Internal hemorrhoid, Leg ulcer, left (HCC), Osteoporosis, unspecified, Perennial allergic rhinitis, Peripheral vascular disease (HCC), Pneumonia, Restless legs syndrome (RLS), Rheumatoid arthritis (HCC), Thyroid nodule, Unspecified essential hypertension, and Urticaria.   reports that she has never smoked. She has never used smokeless tobacco.  Past Surgical History:  Procedure Laterality Date   CARDIAC CATHETERIZATION  08/ 01/ 2008   dr Melburn Popper   normal -- EF of 65%   CARDIOVASCULAR STRESS TEST  05-22-2011   dr Melburn Popper   normal lexiscan perfusion study/  no ischemia/  lvsf  86%   CATARACT EXTRACTION W/ INTRAOCULAR LENS  IMPLANT, BILATERAL     CHOLECYSTECTOMY  1993   laparoscopic   CONVERSION TO TOTAL HIP Left 12/03/2017   Procedure: CONVERSION TO LEFT TOTAL HIP;  Surgeon: Durene Romans, MD;  Location: [facility];  Service: Orthopedics;  Laterality: Left;   90 mins   HIP ARTHROPLASTY Left 12/22/2016   Procedure: HEMIARTHROPLASTY, LEFT;  Surgeon: Durene Romans, MD;  Location: [facility];  Service: Orthopedics;  Laterality: Left;   INCISION AND DRAINAGE OF WOUND Left 10/24/2013   Procedure: IRRIGATION AND DEBRIDEMENT OF LEFT LEG WITH PLACEMENT OF ACELL AND WOUND VAC;  Surgeon: Wayland Denis, DO;  Location: [facility];  Service: Plastics;  Laterality: Left;   KNEE ARTHROPLASTY     LUMBAR DISC SURGERY  1981  &  1983   MASTECTOMY Bilateral rigth 1989///   left  1990   breast cancer 1989//   fibrocytic disease 1990   ORIF HUMERUS FRACTURE Right 09/21/2018   Procedure: OPEN REDUCTION INTERNAL FIXATION (ORIF) PROXIMAL HUMERUS FRACTURE;  Surgeon: Yolonda Kida, MD;  Location: [facility];  Service: Orthopedics;  Laterality: Right;  90 mins   TOTAL ABDOMINAL HYSTERECTOMY W/ BILATERAL SALPINGOOPHORECTOMY  1989   done at  same time as right mastectomy   TOTAL HIP ARTHROPLASTY Right 04/02/2021   Procedure: TOTAL HIP ARTHROPLASTY ANTERIOR APPROACH;  Surgeon: Durene Romans, MD;  Location: [facility];  Service: Orthopedics;  Laterality: Right;   TOTAL KNEE ARTHROPLASTY Right 10/05/2012   Procedure: RIGHT TOTAL KNEE ARTHROPLASTY;  Surgeon: Shelda Pal, MD;  Location: [facility];  Service: Orthopedics;  Laterality: Right;    Allergies  Allergen Reactions   Codeine Nausea And Vomiting   Hydrocodone Nausea And Vomiting and Other (See Comments)   Ace Inhibitors     Other reaction(s): cough   Amlodipine     Other reaction(s): too much edemastopped it around 4/17   Egg Phospholipids    Egg-Derived Products     UNSPECIFIED REACTION  RAW EGGS.. Can eat cooked eggs   Ferrous Sulfate     Other reaction(s): hurt stomach.   Influenza Vaccines     UNSPECIFIED REACTION  Egg allergy    Metoprolol     Other reaction(s): cannot sleep on it and i worry about beta blocker with her asthma   Nucala [Mepolizumab]     UNSPECIFIED REACTION    Telithromycin      Other reaction(s): N/V possibly   Cephalosporins Rash   Gabapentin Nausea Only   Levofloxacin Rash   Pneumococcal Vaccines Rash    Immunization History  Administered Date(s) Administered   Influenza Split 01/24/2009, 06/03/2011, 08/12/2012, 09/05/2014   Janssen (J&J) SARS-COV-2 Vaccination 04/19/2020   Pneumococcal Conjugate-13 09/14/2012   Pneumococcal Polysaccharide-23 01/31/2009, 06/03/2011   Td 06/03/2011   Tdap 09/07/2018   Zoster Recombinant(Shingrix) 04/18/2017, 09/16/2017   Zoster, Live 07/30/2007, 06/03/2011    Family History  Problem Relation Age of Onset   Heart attack Mother    Asthma Mother    Heart disease Mother    Hyperlipidemia Mother    Allergic rhinitis Mother    Heart attack Father    Heart failure Father    Deep vein thrombosis Father    Heart disease Father    Peripheral vascular disease Father        amputation   Asthma Brother    Allergic rhinitis Brother    Asthma Sister    Eczema Sister    Allergic rhinitis Sister    Cancer Brother    Asthma Brother    Coronary artery disease Sister    Heart disease Sister    Asthma Maternal Grandmother    Urticaria Neg Hx      Current Outpatient Medications:    Abaloparatide (TYMLOS) 3120 MCG/1.56ML SOPN, Inject Subcutaneous each morning, Disp: , Rfl:    albuterol (PROAIR HFA) 108 (90 Base) MCG/ACT inhaler, Inhale 2 puffs into the lungs every 6 (six) hours as needed for wheezing or shortness of breath., Disp: 18 g, Rfl: 5   albuterol (PROVENTIL) (2.5 MG/3ML) 0.083% nebulizer solution, Take 3 mLs (2.5 mg total) by nebulization every 6 (six) hours as needed for wheezing or shortness of breath., Disp: 75 mL, Rfl: 12   budesonide-formoterol (SYMBICORT) 160-4.5 MCG/ACT inhaler, Inhale 2 puffs into the lungs in the morning and at bedtime., Disp: 10.2 g, Rfl: 5   Calcium Carbonate-Vitamin D 600-400 MG-UNIT tablet, Take 1 tablet by mouth daily., Disp: , Rfl:    chlorpheniramine (CHLOR-TRIMETON) 4 MG tablet,  Take 4-8 mg by mouth See admin instructions. 4 mg in the morning and 8 mg at bedtime, Disp: , Rfl:    Cholecalciferol (VITAMIN D3) 1000 UNITS tablet, Take 1,000 Units by mouth every evening. , Disp: ,  Rfl:    dextromethorphan (DELSYM) 30 MG/5ML liquid, Take 60 mg by mouth 2 (two) times daily as needed for cough., Disp: , Rfl:    docusate sodium (COLACE) 100 MG capsule, Take 1 capsule (100 mg total) by mouth 2 (two) times daily., Disp: 10 capsule, Rfl: 0   dupilumab (DUPIXENT) 200 MG/1. prefilled syringe, Inject 200 mg into the skin every 14 (fourteen) days., Disp: 6.84 mL, Rfl: 0   fexofenadine (ALLEGRA) 180 MG tablet, Take 180 mg by mouth daily.  , Disp: , Rfl:    furosemide (LASIX) 20 MG tablet, TAKE 1 TABLET (20 MG TOTAL) BY MOUTH DAILY AS NEEDED FOR FLUID OR EDEMA., Disp: 90 tablet, Rfl: 2   ipratropium (ATROVENT) 0.06 % nasal spray, Place 2 sprays into both nostrils 2 (two) times daily., Disp: 15 mL, Rfl: 6   mirtazapine (REMERON) 15 MG tablet, Take 15 mg by mouth at bedtime., Disp: , Rfl:    mometasone (NASONEX) 50 MCG/ACT nasal spray, USE 2 SPRAYS NASALLY 2 TIMES DAILY, Disp: 17 g, Rfl: 11   montelukast (SINGULAIR) 10 MG tablet, TAKE 1 TABLET BY MOUTH EVERYDAY AT BEDTIME, Disp: 90 tablet, Rfl: 2   Multiple Vitamin (MULTIVITAMIN WITH MINERALS) TABS tablet, Take 1 tablet by mouth daily. Centrum Silver Multivitamin, Disp: , Rfl:    multivitamin-lutein (OCUVITE-LUTEIN) CAPS capsule, Take 1 capsule by mouth daily., Disp: , Rfl:    mupirocin ointment (BACTROBAN) 2 %, as needed. , Disp: , Rfl:    nitroGLYCERIN (NITROSTAT) 0.4 MG SL tablet, Place 1 tablet (0.4 mg total) under the tongue every 5 (five) minutes as needed for chest pain., Disp: 25 tablet, Rfl: 3   pantoprazole (PROTONIX) 40 MG tablet, Take 40 mg by mouth daily., Disp: , Rfl:    potassium chloride (KLOR-CON) 10 MEQ tablet, Take 1 tablet (10 mEq total) by mouth daily., Disp: 90 tablet, Rfl: 3   pramipexole (MIRAPEX) 0.125 MG tablet,  Take 0.375 mg by mouth at bedtime., Disp: , Rfl:    rivaroxaban (XARELTO) 10 MG TABS tablet, Take 10 mg by mouth daily., Disp: , Rfl:    rosuvastatin (CRESTOR) 5 MG tablet, TAKE 1 TABLET BY MOUTH EVERYDAY AT BEDTIME, Disp: 90 tablet, Rfl: 3   Spacer/Aero-Holding Chambers (AEROCHAMBER MV) inhaler, by Other route. Use as instructed , Disp: , Rfl:    telmisartan (MICARDIS) 20 MG tablet, Take 20 mg by mouth daily. , Disp: , Rfl:    vitamin B-12 (CYANOCOBALAMIN) 500 MCG tablet, Take 500 mcg by mouth daily.  , Disp: , Rfl:   Current Facility-Administered Medications:    albuterol (PROVENTIL) (2.5 MG/3ML) 0.083% nebulizer solution 2.5 mg, 2.5 mg, Nebulization, Q6H PRN, Bevelyn Ngo, NP, 2.5 mg at 10/08/21 0918   albuterol (VENTOLIN HFA) 108 (90 Base) MCG/ACT inhaler 2 puff, 2 puff, Inhalation, Once PRN, Causey, Larna Daughters, NP   diphenhydrAMINE (BENADRYL) injection 50 mg, 50 mg, Intramuscular, Once PRN, Causey, Larna Daughters, NP   EPINEPHrine (EPI-PEN) injection 0.3 mg, 0.3 mg, Intramuscular, Once PRN, Loa Socks, NP      Objective:   Vitals:   12/16/22 0847  BP: 118/60  Pulse: 79  SpO2: 98%  Weight: 139 lb 12.8 oz (63.4 kg)  Height: 5' 3.5" (1.613 m)    Estimated body mass index is 24.38 kg/m as calculated from the following:   Height as of this encounter: 5' 3.5" (1.613 m).   Weight as of this encounter: 139 lb 12.8 oz (63.4 kg).  @WEIGHTCHANGE @  [employer]  12/16/22 0847  Weight: 139 lb 12.8 oz (63.4 kg)     Physical Exam   General: No distress. Looks well O2 at rest: no Cane present: no Sitting in wheel chair: no Frail: no Obese: no Neuro: Alert and Oriented x 3. GCS 15. Speech normal Psych: Pleasant Resp:  Barrel Chest - no.  Wheeze - no, Crackles - no, No overt respiratory distress CVS: Normal heart sounds. Murmurs - no Ext: Stigmata of Connective Tissue Disease - no HEENT: Normal upper airway. PEERL +. No post nasal drip         Assessment:       ICD-10-CM   1. Severe persistent asthma without complication  J45.50     2. Vaccine counseling  Z71.85     3. Other fatigue  R53.83     4. DOE (dyspnea on exertion)  R06.09          Plan:     Patient Instructions     ICD-10-CM   1. Severe persistent asthma without complication  J45.50     2. Vaccine counseling  Z71.85     3. Other fatigue  R53.83     4. DOE (dyspnea on exertion)  R06.09         Well controlled after restart dupixent summer 2023   plan - continue low dose  Dupixent   = continue Symbicort 2 puffs Twice daily, rinse after use.  -Stop Singulair - Respect no respiratory vaccines - Referral to pulmonary rehabilitation  Plan Follow up with Dr. Marchelle Gearing in 6 months or sooner if heeded Please contact office for sooner follow up if symptoms do not improve or worsen or seek emergency care    FOLLOWUP Return in about 6 months (around 06/18/2023) for 15 min visit, Asthma, with Dr Marchelle Gearing, Face to Face Visit.    SIGNATURE    Dr. Kalman Shan, M.D., F.C.C.P,  Pulmonary and Critical Care Medicine Staff Physician, [facility] Director - Interstitial Lung Disease  Program  Pulmonary Fibrosis [facility] [place], [place], [address]  Pager: [phone], If no answer or between  15:00h - 7:00h: call [phone] Telephone: [phone]  9:32 AM 12/16/2022

## 2022-12-16 NOTE — Patient Instructions (Addendum)
ICD-10-CM   1. Severe persistent asthma without complication  J45.50     2. Vaccine counseling  Z71.85     3. Other fatigue  R53.83     4. DOE (dyspnea on exertion)  R06.09         Well controlled after restart dupixent summer 2023   plan - continue low dose  Dupixent   = continue Symbicort 2 puffs Twice daily, rinse after use.  -Stop Singulair - Respect no respiratory vaccines - Referral to pulmonary rehabilitation  Plan Follow up with Dr. Marchelle Gearing in 6 months or sooner if heeded Please contact office for sooner follow up if symptoms do not improve or worsen or seek emergency care

## 2022-12-23 ENCOUNTER — Telehealth (HOSPITAL_COMMUNITY): Payer: Self-pay

## 2022-12-23 ENCOUNTER — Encounter (HOSPITAL_COMMUNITY): Payer: Self-pay

## 2022-12-23 NOTE — Telephone Encounter (Signed)
Called patient to see if she was interested in participating in the Pulmonary Rehab Program. Patient stated yes. Patient will come in for orientation on 8/16@9  and will attend the 10:15 exercise class.   Sent package.

## 2022-12-23 NOTE — Telephone Encounter (Signed)
Pt insurance is active and benefits verified through [facility] Medicare Co-pay $15, DED 0/0 met, out of pocket $3,600/Z$219.74 met, co-insurance 0%. no pre-authorization required, Toni/[employer] 12/23/2022@1 :50, REF# [id number]  36 VISIT LIMIT UPTO 72 LIFETIME SESSIONS.

## 2022-12-24 ENCOUNTER — Telehealth (HOSPITAL_COMMUNITY): Payer: Self-pay

## 2022-12-24 NOTE — Telephone Encounter (Signed)
Called to confirm appt. Pt confirmed appt. Instructed pt on proper footwear. Gave directions along with department number.

## 2022-12-26 ENCOUNTER — Encounter (HOSPITAL_COMMUNITY)
Admission: RE | Admit: 2022-12-26 | Discharge: 2022-12-26 | Disposition: A | Discharge status: Home / Self Care | Payer: Medicare Other | Source: Ambulatory Visit | Attending: Internal Medicine | Admitting: Internal Medicine

## 2022-12-26 ENCOUNTER — Encounter (HOSPITAL_COMMUNITY): Payer: Self-pay

## 2022-12-26 VITALS — BP 140/74 | HR 80 | Ht 63.0 in | Wt 144.8 lb

## 2022-12-26 DIAGNOSIS — J4550 Severe persistent asthma, uncomplicated: Secondary | ICD-10-CM | POA: Diagnosis present

## 2022-12-26 NOTE — Progress Notes (Signed)
[name] 89 y.o. female Pulmonary Rehab Orientation Note This patient who was referred to Pulmonary Rehab by Dr. Conni Elliot with the diagnosis of Severe persistent asthma arrived today in Cardiac and Pulmonary Rehab. She  arrived ambulatory with normal gait. She  does not carry portable oxygen. Per patient, [name] uses oxygen never. Color good, skin warm and dry. Patient is oriented to time and place. Patient's medical history, psychosocial health, and medications reviewed. Psychosocial assessment reveals patient lives with spouse. [name] is currently retired. Patient hobbies include playing piano and crochet, and knitting. Patient reports her stress level is moderate. Areas of stress/anxiety include health and family . Patient does not exhibit signs of depression. PHQ2/9 score 0/4. [name] shows good  coping skills with positive outlook on life. Offered emotional support and reassurance. Will continue to monitor. Physical assessment performed by Essie Hart RN. Please see their orientation physical assessment note. [name] reports she does take medications as prescribed. Patient states she follows a low sodium  diet. The patient has been trying to lose weight through a healthy diet and exercise program.. Patient's weight will be monitored closely. Demonstration and practice of PLB using pulse oximeter. Jaleh able to return demonstration satisfactorily. Safety and hand hygiene in the exercise area reviewed with patient. [name] voices understanding of the information reviewed. Department expectations discussed with patient and achievable goals were set. The patient shows enthusiasm about attending the program and we look forward to working with [name]. [name] completed a 6 min walk test today and is scheduled to begin exercise on 01/01/23 at 10:15 am.   [id number] Joya San, MS, ACSM-CEP

## 2022-12-26 NOTE — Progress Notes (Signed)
Pulmonary Individual Treatment Plan  Patient Details  Name: [name] MRN: [id number] Date of Birth: October 14, 1932 Referring Provider:   Doristine Devoid Pulmonary Rehab Walk Test from 12/26/2022 in [facility]  Referring Provider Ramawamy       Initial Encounter Date:  Flowsheet Row Pulmonary Rehab Walk Test from 12/26/2022 in [facility]  Date 12/26/22       Visit Diagnosis: Severe persistent asthma without complication  Patient's Home Medications on Admission:   Current Outpatient Medications:    Abaloparatide (TYMLOS) 3120 MCG/1.56ML SOPN, Inject Subcutaneous each morning, Disp: , Rfl:    albuterol (PROAIR HFA) 108 (90 Base) MCG/ACT inhaler, Inhale 2 puffs into the lungs every 6 (six) hours as needed for wheezing or shortness of breath., Disp: 18 g, Rfl: 5   albuterol (PROVENTIL) (2.5 MG/3ML) 0.083% nebulizer solution, Take 3 mLs (2.5 mg total) by nebulization every 6 (six) hours as needed for wheezing or shortness of breath., Disp: 75 mL, Rfl: 12   budesonide-formoterol (SYMBICORT) 160-4.5 MCG/ACT inhaler, Inhale 2 puffs into the lungs in the morning and at bedtime., Disp: 10.2 g, Rfl: 5   Calcium Carbonate-Vitamin D 600-400 MG-UNIT tablet, Take 1 tablet by mouth daily., Disp: , Rfl:    chlorpheniramine (CHLOR-TRIMETON) 4 MG tablet, Take 4-8 mg by mouth See admin instructions. 4 mg in the morning and 8 mg at bedtime, Disp: , Rfl:    Cholecalciferol (VITAMIN D3) 1000 UNITS tablet, Take 1,000 Units by mouth every evening. , Disp: , Rfl:    dextromethorphan (DELSYM) 30 MG/5ML liquid, Take 60 mg by mouth 2 (two) times daily as needed for cough., Disp: , Rfl:    docusate sodium (COLACE) 100 MG capsule, Take 1 capsule (100 mg total) by mouth 2 (two) times daily., Disp: 10 capsule, Rfl: 0   dupilumab (DUPIXENT) 200 MG/1. prefilled syringe, Inject 200 mg into the  skin every 14 (fourteen) days., Disp: 6.84 mL, Rfl: 2   fexofenadine (ALLEGRA) 180 MG tablet, Take 180 mg by mouth daily.  , Disp: , Rfl:    furosemide (LASIX) 20 MG tablet, TAKE 1 TABLET (20 MG TOTAL) BY MOUTH DAILY AS NEEDED FOR FLUID OR EDEMA., Disp: 90 tablet, Rfl: 2   ipratropium (ATROVENT) 0.06 % nasal spray, Place 2 sprays into both nostrils 2 (two) times daily., Disp: 15 mL, Rfl: 6   mirtazapine (REMERON) 15 MG tablet, Take 15 mg by mouth at bedtime., Disp: , Rfl:    mometasone (NASONEX) 50 MCG/ACT nasal spray, USE 2 SPRAYS NASALLY 2 TIMES DAILY, Disp: 17 g, Rfl: 11   Multiple Vitamin (MULTIVITAMIN WITH MINERALS) TABS tablet, Take 1 tablet by mouth daily. Centrum Silver Multivitamin, Disp: , Rfl:    multivitamin-lutein (OCUVITE-LUTEIN) CAPS capsule, Take 1 capsule by mouth daily., Disp: , Rfl:    mupirocin ointment (BACTROBAN) 2 %, as needed. , Disp: , Rfl:    pantoprazole (PROTONIX) 40 MG tablet, Take 40 mg by mouth daily., Disp: , Rfl:    potassium chloride (KLOR-CON) 10 MEQ tablet, Take 1 tablet (10 mEq total) by mouth daily., Disp: 90 tablet, Rfl: 3   pramipexole (MIRAPEX) 0.125 MG tablet, Take 0.375 mg by mouth at bedtime., Disp: , Rfl:    rivaroxaban (XARELTO) 10 MG TABS tablet, Take 10 mg by mouth daily., Disp: , Rfl:    rosuvastatin (CRESTOR) 5 MG tablet, TAKE 1 TABLET BY MOUTH EVERYDAY AT BEDTIME, Disp:  90 tablet, Rfl: 3   Spacer/Aero-Holding Chambers (AEROCHAMBER MV) inhaler, by Other route. Use as instructed , Disp: , Rfl:    telmisartan (MICARDIS) 20 MG tablet, Take 20 mg by mouth daily. , Disp: , Rfl:    vitamin B-12 (CYANOCOBALAMIN) 500 MCG tablet, Take 500 mcg by mouth daily.  , Disp: , Rfl:    montelukast (SINGULAIR) 10 MG tablet, TAKE 1 TABLET BY MOUTH EVERYDAY AT BEDTIME (Patient not taking: Reported on 12/26/2022), Disp: 90 tablet, Rfl: 2   nitroGLYCERIN (NITROSTAT) 0.4 MG SL tablet, Place 1 tablet (0.4 mg total) under the tongue every 5 (five) minutes as needed for chest  pain. (Patient not taking: Reported on 12/26/2022), Disp: 25 tablet, Rfl: 3  Current Facility-Administered Medications:    albuterol (PROVENTIL) (2.5 MG/3ML) 0.083% nebulizer solution 2.5 mg, 2.5 mg, Nebulization, Q6H PRN, Bevelyn Ngo, NP, 2.5 mg at 10/08/21 4782   albuterol (VENTOLIN HFA) 108 (90 Base) MCG/ACT inhaler 2 puff, 2 puff, Inhalation, Once PRN, Causey, Larna Daughters, NP   diphenhydrAMINE (BENADRYL) injection 50 mg, 50 mg, Intramuscular, Once PRN, Causey, Larna Daughters, NP   EPINEPHrine (EPI-PEN) injection 0.3 mg, 0.3 mg, Intramuscular, Once PRN, Causey, Larna Daughters, NP  Past Medical History: Past Medical History:  Diagnosis Date   Anemia    after hysterectomy   Angio-edema    Asthma, severe persistent    pulmologist-- Ramaswamy   Cancer [facility])    Breast cancer on right   Chronic kidney disease, stage II (mild)    DDD (degenerative disc disease), cervical    Diverticulitis    Diverticulosis yrs ago   hx of    Edema, lower extremity    occ both legs swell   GERD (gastroesophageal reflux disease)    Headache    sinus headaches   Heart murmur    no problems - per pt   History of breast cancer 1990 left mastectomy also   1989  S/P   RIGHT MASTECTOMY ;  NO CHEMORADIATION //   NO RECURRENCE   History of DVT of lower extremity 5 yrs ago   right leg   History of shingles    Hyperlipidemia    Internal hemorrhoid    Leg ulcer, left (HCC)    Osteoporosis, unspecified    Knee and hip osteoarthritis bilaterally   Perennial allergic rhinitis    Peripheral vascular disease (HCC)    Pneumonia    Restless legs syndrome (RLS)    Rheumatoid arthritis (HCC)    hands   Thyroid nodule    followed by dr perrini yearly, no current problam   Unspecified essential hypertension    Urticaria     Tobacco Use: Social History   Tobacco Use  Smoking Status Never  Smokeless Tobacco Never    Labs: Review Flowsheet       Latest Ref Rng & Units 11/10/2011 03/03/2013  02/23/2014 03/19/2017  Labs for ITP Cardiac and Pulmonary Rehab  Cholestrol 100 - 199 mg/dL 956  213  086  578   LDL (calc) 0 - 99 mg/dL 49  62  79  61   HDL-C >39 mg/dL 46.96  295.28  41.32  440   Trlycerides 0 - 149 mg/dL 10.2  72.5  36.6  57     Details            Capillary Blood Glucose: No results found for: "GLUCAP"   Pulmonary Assessment Scores:  Pulmonary Assessment Scores     Row Name 12/26/22 [phone]  ADL UCSD   ADL Phase Entry     SOB Score total 46       CAT Score   CAT Score 19       mMRC Score   mMRC Score 3             UCSD: Self-administered rating of dyspnea associated with activities of daily living (ADLs) 6-point scale (0 = "not at all" to 5 = "maximal or unable to do because of breathlessness")  Scoring Scores range from 0 to 120.  Minimally important difference is 5 units  CAT: CAT can identify the health impairment of COPD patients and is better correlated with disease progression.  CAT has a scoring range of zero to 40. The CAT score is classified into four groups of low (less than 10), medium (10 - 20), high (21-30) and very high (31-40) based on the impact level of disease on health status. A CAT score over 10 suggests significant symptoms.  A worsening CAT score could be explained by an exacerbation, poor medication adherence, poor inhaler technique, or progression of COPD or comorbid conditions.  CAT MCID is 2 points  mMRC: mMRC (Modified Medical Research Council) Dyspnea Scale is used to assess the degree of baseline functional disability in patients of respiratory disease due to dyspnea. No minimal important difference is established. A decrease in score of 1 point or greater is considered a positive change.   Pulmonary Function Assessment:  Pulmonary Function Assessment - 12/26/22 1051       Breath   Bilateral Breath Sounds Clear    Shortness of Breath Yes;Limiting activity             Exercise Target  Goals: Exercise Program Goal: Individual exercise prescription set using results from initial 6 min walk test and THRR while considering  patient's activity barriers and safety.   Exercise Prescription Goal: Initial exercise prescription builds to 30-45 minutes a day of aerobic activity, 2-3 days per week.  Home exercise guidelines will be given to patient during program as part of exercise prescription that the participant will acknowledge.  Activity Barriers & Risk Stratification:  Activity Barriers & Cardiac Risk Stratification - 12/26/22 0924       Activity Barriers & Cardiac Risk Stratification   Activity Barriers Arthritis;Back Problems;Deconditioning;Muscular Weakness;Shortness of Breath;Right Hip Replacement;Joint Problems    Comments waiting for right shoulder replacement             6 Minute Walk:  6 Minute Walk     Row Name 12/26/22 1042         6 Minute Walk   Phase Initial     Distance 1022 feet     Walk Time 6 minutes     # of Rest Breaks 2  3:10-3:37, 5:37-6:00     MPH 1.97     METS 1.52     RPE 13     Perceived Dyspnea  2     VO2 Peak 5.34     Symptoms No     Resting HR 80 bpm     Resting BP 140/74     Resting Oxygen Saturation  100 %     Exercise Oxygen Saturation  during 6 min walk 95 %     Max Ex. HR 89 bpm     Max Ex. BP 150/80     2 Minute Post BP 148/78       Interval HR   1 Minute HR 82  2 Minute HR 86     3 Minute HR 86     4 Minute HR 85     5 Minute HR 89     6 Minute HR 89     2 Minute Post HR 85     Interval Heart Rate? Yes       Interval Oxygen   Interval Oxygen? Yes     Baseline Oxygen Saturation % 100 %     1 Minute Oxygen Saturation % 98 %     1 Minute Liters of Oxygen 0 L     2 Minute Oxygen Saturation % 95 %     2 Minute Liters of Oxygen 0 L     3 Minute Oxygen Saturation % 97 %     3 Minute Liters of Oxygen 0 L     4 Minute Oxygen Saturation % 99 %     4 Minute Liters of Oxygen 0 L     5 Minute Oxygen Saturation  % 100 %     5 Minute Liters of Oxygen 0 L     6 Minute Oxygen Saturation % 96 %     6 Minute Liters of Oxygen 0 L     2 Minute Post Oxygen Saturation % 97 %     2 Minute Post Liters of Oxygen 0 L              Oxygen Initial Assessment:  Oxygen Initial Assessment - 12/26/22 0927       Home Oxygen   Home Oxygen Device None    Sleep Oxygen Prescription None    Home Exercise Oxygen Prescription None    Home Resting Oxygen Prescription None      Initial 6 min Walk   Oxygen Used None      Program Oxygen Prescription   Program Oxygen Prescription None      Intervention   Short Term Goals To learn and understand importance of maintaining oxygen saturations>88%;To learn and demonstrate proper use of respiratory medications;To learn and understand importance of monitoring SPO2 with pulse oximeter and demonstrate accurate use of the pulse oximeter.;To learn and demonstrate proper pursed lip breathing techniques or other breathing techniques.     Long  Term Goals Verbalizes importance of monitoring SPO2 with pulse oximeter and return demonstration;Maintenance of O2 saturations>88%;Exhibits proper breathing techniques, such as pursed lip breathing or other method taught during program session;Compliance with respiratory medication;Demonstrates proper use of MDI's             Oxygen Re-Evaluation:   Oxygen Discharge (Final Oxygen Re-Evaluation):   Initial Exercise Prescription:  Initial Exercise Prescription - 12/26/22 1000       Date of Initial Exercise RX and Referring Provider   Date 12/26/22    Referring Provider Ramawamy    Expected Discharge Date 03/26/23      NuStep   Level 1    SPM 50    Minutes 30    METs 1.5      Prescription Details   Frequency (times per week) 2    Duration Progress to 30 minutes of continuous aerobic without signs/symptoms of physical distress      Intensity   THRR 40-80% of Max Heartrate 53-106    Ratings of Perceived Exertion 11-13     Perceived Dyspnea 0-4      Progression   Progression Continue to progress workloads to maintain intensity without signs/symptoms of physical distress.      Resistance Training  Training Prescription Yes    Weight red bands    Reps 10-15             Perform Capillary Blood Glucose checks as needed.  Exercise Prescription Changes:   Exercise Comments:   Exercise Goals and Review:   Exercise Goals     Row Name 12/26/22 1038             Exercise Goals   Increase Physical Activity Yes       Intervention Provide advice, education, support and counseling about physical activity/exercise needs.;Develop an individualized exercise prescription for aerobic and resistive training based on initial evaluation findings, risk stratification, comorbidities and participant's personal goals.       Expected Outcomes Short Term: Attend rehab on a regular basis to increase amount of physical activity.;Long Term: Exercising regularly at least 3-5 days a week.;Long Term: Add in home exercise to make exercise part of routine and to increase amount of physical activity.       Increase Strength and Stamina Yes       Intervention Provide advice, education, support and counseling about physical activity/exercise needs.;Develop an individualized exercise prescription for aerobic and resistive training based on initial evaluation findings, risk stratification, comorbidities and participant's personal goals.       Expected Outcomes Short Term: Increase workloads from initial exercise prescription for resistance, speed, and METs.;Short Term: Perform resistance training exercises routinely during rehab and add in resistance training at home;Long Term: Improve cardiorespiratory fitness, muscular endurance and strength as measured by increased METs and functional capacity ( )       Able to understand and use rate of perceived exertion (RPE) scale Yes       Intervention Provide education and explanation  on how to use RPE scale       Expected Outcomes Short Term: Able to use RPE daily in rehab to express subjective intensity level;Long Term:  Able to use RPE to guide intensity level when exercising independently       Able to understand and use Dyspnea scale Yes       Intervention Provide education and explanation on how to use Dyspnea scale       Expected Outcomes Short Term: Able to use Dyspnea scale daily in rehab to express subjective sense of shortness of breath during exertion;Long Term: Able to use Dyspnea scale to guide intensity level when exercising independently       Knowledge and understanding of Target Heart Rate Range (THRR) Yes       Intervention Provide education and explanation of THRR including how the numbers were predicted and where they are located for reference       Expected Outcomes Short Term: Able to state/look up THRR;Long Term: Able to use THRR to govern intensity when exercising independently;Short Term: Able to use daily as guideline for intensity in rehab       Understanding of Exercise Prescription Yes       Intervention Provide education, explanation, and written materials on patient's individual exercise prescription       Expected Outcomes Short Term: Able to explain program exercise prescription;Long Term: Able to explain home exercise prescription to exercise independently                Exercise Goals Re-Evaluation :   Discharge Exercise Prescription (Final Exercise Prescription Changes):   Nutrition:  Target Goals: Understanding of nutrition guidelines, daily intake of sodium 1500mg , cholesterol 200mg , calories 30% from fat and 7% or less from saturated  fats, daily to have 5 or more servings of fruits and vegetables.  Biometrics:    Nutrition Therapy Plan and Nutrition Goals:   Nutrition Assessments:  MEDIFICTS Score Key: ?70 Need to make dietary changes  40-70 Heart Healthy Diet ? 40 Therapeutic Level Cholesterol Diet   Picture Your  Plate Scores: <62 Unhealthy dietary pattern with much room for improvement. 41-50 Dietary pattern unlikely to meet recommendations for good health and room for improvement. 51-60 More healthful dietary pattern, with some room for improvement.  >60 Healthy dietary pattern, although there may be some specific behaviors that could be improved.    Nutrition Goals Re-Evaluation:   Nutrition Goals Discharge (Final Nutrition Goals Re-Evaluation):   Psychosocial: Target Goals: Acknowledge presence or absence of significant depression and/or stress, maximize coping skills, provide positive support system. Participant is able to verbalize types and ability to use techniques and skills needed for reducing stress and depression.  Initial Review & Psychosocial Screening:  Initial Psych Review & Screening - 12/26/22 0925       Initial Review   Current issues with None Identified      Family Dynamics   Good Support System? 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 She denies any psychosocial barriers to rehab      Barriers   Psychosocial barriers to participate in program There are no identifiable barriers or psychosocial needs.             Quality of Life Scores:  Scores of 19 and below usually indicate a poorer quality of life in these areas.  A difference of  2-3 points is a clinically meaningful difference.  A difference of 2-3 points in the total score of the Quality of Life Index has been associated with significant improvement in overall quality of life, self-image, physical symptoms, and general health in studies assessing change in quality of life.  PHQ-9: Review Flowsheet       12/26/2022 01/01/2015  Depression screen PHQ 2/9  Decreased Interest 0 0  Down, Depressed, Hopeless 0 0  PHQ - 2 Score 0 0  Altered sleeping 3 -  Tired, decreased energy 1 -  Change in appetite 0 -  Feeling bad or failure about yourself  0 -  Trouble concentrating 0 -  Moving slowly or  fidgety/restless 0 -  Suicidal thoughts 0 -  PHQ-9 Score 4 -  Difficult doing work/chores Somewhat difficult -    Details           Interpretation of Total Score  Total Score Depression Severity:  1-4 = Minimal depression, 5-9 = Mild depression, 10-14 = Moderate depression, 15-19 = Moderately severe depression, 20-27 = Severe depression   Psychosocial Evaluation and Intervention:  Psychosocial Evaluation - 12/26/22 0926       Psychosocial Evaluation & Interventions   Interventions Encouraged to exercise with the program and follow exercise prescription    Comments Amarelis denies any psychosocial barriers to rehab    Expected Outcomes For Kennon to participate in rehab free of psychosocial concerns.    Continue Psychosocial Services  No Follow up required             Psychosocial Re-Evaluation:   Psychosocial Discharge (Final Psychosocial Re-Evaluation):   Education: Education Goals: Education classes will be provided on a weekly basis, covering required topics. Participant will state understanding/return demonstration of topics presented.  Learning Barriers/Preferences:  Learning Barriers/Preferences - 12/26/22 1308       Learning Barriers/Preferences  Learning Barriers Sight    Learning Preferences Skilled Demonstration;Video;Pictoral             Education Topics: Introduction to Pulmonary Rehab Group instruction provided by PowerPoint, verbal discussion, and written material to support subject matter. Instructor reviews what Pulmonary Rehab is, the purpose of the program, and how patients are referred.     Know Your Numbers Group instruction that is supported by a PowerPoint presentation. Instructor discusses importance of knowing and understanding resting, exercise, and post-exercise oxygen saturation, heart rate, and blood pressure. Oxygen saturation, heart rate, blood pressure, rating of perceived exertion, and dyspnea are reviewed along with a  normal range for these values.    Exercise for the Pulmonary Patient Group instruction that is supported by a PowerPoint presentation. Instructor discusses benefits of exercise, core components of exercise, frequency, duration, and intensity of an exercise routine, importance of utilizing pulse oximetry during exercise, safety while exercising, and options of places to exercise outside of rehab.       MET Level  Group instruction provided by PowerPoint, verbal discussion, and written material to support subject matter. Instructor reviews what METs are and how to increase METs.    Pulmonary Medications Verbally interactive group education provided by instructor with focus on inhaled medications and proper administration.   Anatomy and Physiology of the Respiratory System Group instruction provided by PowerPoint, verbal discussion, and written material to support subject matter. Instructor reviews respiratory cycle and anatomical components of the respiratory system and their functions. Instructor also reviews differences in obstructive and restrictive respiratory diseases with examples of each.    Oxygen Safety Group instruction provided by PowerPoint, verbal discussion, and written material to support subject matter. There is an overview of "What is Oxygen" and "Why do we need it".  Instructor also reviews how to create a safe environment for oxygen use, the importance of using oxygen as prescribed, and the risks of noncompliance. There is a brief discussion on traveling with oxygen and resources the patient may utilize.   Oxygen Use Group instruction provided by PowerPoint, verbal discussion, and written material to discuss how supplemental oxygen is prescribed and different types of oxygen supply systems. Resources for more information are provided.    Breathing Techniques Group instruction that is supported by demonstration and informational handouts. Instructor discusses the  benefits of pursed lip and diaphragmatic breathing and detailed demonstration on how to perform both.     Risk Factor Reduction Group instruction that is supported by a PowerPoint presentation. Instructor discusses the definition of a risk factor, different risk factors for pulmonary disease, and how the heart and lungs work together.   MD Day A group question and answer session with a medical doctor that allows participants to ask questions that relate to their pulmonary disease state.   Nutrition for the Pulmonary Patient Group instruction provided by PowerPoint slides, verbal discussion, and written materials to support subject matter. The instructor gives an explanation and review of healthy diet recommendations, which includes a discussion on weight management, recommendations for fruit and vegetable consumption, as well as protein, fluid, caffeine, fiber, sodium, sugar, and alcohol. Tips for eating when patients are short of breath are discussed.    Other Education Group or individual verbal, written, or video instructions that support the educational goals of the pulmonary rehab program.    Knowledge Questionnaire Score:  Knowledge Questionnaire Score - 12/26/22 1036       Knowledge Questionnaire Score   Pre Score 16/18  Core Components/Risk Factors/Patient Goals at Admission:  Personal Goals and Risk Factors at Admission - 12/26/22 0927       Core Components/Risk Factors/Patient Goals on Admission    Weight Management Weight Loss;Yes    Intervention Weight Management: Develop a combined nutrition and exercise program designed to reach desired caloric intake, while maintaining appropriate intake of nutrient and fiber, sodium and fats, and appropriate energy expenditure required for the weight goal.;Weight Management: Provide education and appropriate resources to help participant work on and attain dietary goals.;Weight Management/Obesity: Establish reasonable  short term and long term weight goals.;Obesity: Provide education and appropriate resources to help participant work on and attain dietary goals.    Expected Outcomes Short Term: Continue to assess and modify interventions until short term weight is achieved;Long Term: Adherence to nutrition and physical activity/exercise program aimed toward attainment of established weight goal;Weight Maintenance: Understanding of the daily nutrition guidelines, which includes 25-35% calories from fat, 7% or less cal from saturated fats, less than 200mg  cholesterol, less than 1.5gm of sodium, & 5 or more servings of fruits and vegetables daily;Weight Loss: Understanding of general recommendations for a balanced deficit meal plan, which promotes 1-2 lb weight loss per week and includes a negative energy balance of [phone] kcal/d;Understanding recommendations for meals to include 15-35% energy as protein, 25-35% energy from fat, 35-60% energy from carbohydrates, less than 200mg  of dietary cholesterol, 20-35 gm of total fiber daily;Understanding of distribution of calorie intake throughout the day with the consumption of 4-5 meals/snacks;Weight Gain: Understanding of general recommendations for a high calorie, high protein meal plan that promotes weight gain by distributing calorie intake throughout the day with the consumption for 4-5 meals, snacks, and/or supplements    Improve shortness of breath with ADL's Yes    Intervention Provide education, individualized exercise plan and daily activity instruction to help decrease symptoms of SOB with activities of daily living.    Expected Outcomes Short Term: Improve cardiorespiratory fitness to achieve a reduction of symptoms when performing ADLs;Long Term: Be able to perform more ADLs without symptoms or delay the onset of symptoms    Heart Failure Yes    Intervention Provide a combined exercise and nutrition program that is supplemented with education, support and counseling  about heart failure. Directed toward relieving symptoms such as shortness of breath, decreased exercise tolerance, and extremity edema.    Expected Outcomes Improve functional capacity of life;Short term: Attendance in program 2-3 days a week with increased exercise capacity. Reported lower sodium intake. Reported increased fruit and vegetable intake. Reports medication compliance.;Short term: Daily weights obtained and reported for increase. Utilizing diuretic protocols set by physician.;Long term: Adoption of self-care skills and reduction of barriers for early signs and symptoms recognition and intervention leading to self-care maintenance.             Core Components/Risk Factors/Patient Goals Review:    Core Components/Risk Factors/Patient Goals at Discharge (Final Review):    ITP Comments: Dr. Mechele Collin is Medical Director for Pulmonary Rehab at [facility].

## 2022-12-29 ENCOUNTER — Other Ambulatory Visit: Payer: Self-pay | Admitting: Adult Health

## 2023-01-01 ENCOUNTER — Encounter (HOSPITAL_COMMUNITY)
Admission: RE | Admit: 2023-01-01 | Discharge: 2023-01-01 | Disposition: A | Discharge status: Home / Self Care | Payer: Medicare Other | Source: Ambulatory Visit | Attending: Internal Medicine | Admitting: Internal Medicine

## 2023-01-01 DIAGNOSIS — J4550 Severe persistent asthma, uncomplicated: Secondary | ICD-10-CM

## 2023-01-01 NOTE — Progress Notes (Signed)
Daily Session Note  Patient Details  Name: [name] MRN: [id number] Date of Birth: [birth year] Referring Provider:   Doristine Devoid Pulmonary Rehab Walk Test from 12/26/2022 in [facility]  Referring Provider Ramawamy       Encounter Date: 01/01/2023  Check In:  Session Check In - 01/01/23 1100       Check-In   Supervising physician immediately available to respond to emergencies [facility] MD immediately available    Physician(s) Robin Searing, NP    Location [facility]    Staff Present Raford Pitcher, MS, ACSM-CEP, Exercise Physiologist;Casey Synthia Innocent, RN, BSN;Randi Reeve BS, ACSM-CEP, Exercise Physiologist;Samantha Belarus, RD, LDN    Virtual Visit No    Medication changes reported     No    Fall or balance concerns reported    No    Tobacco Cessation No Change    Warm-up and Cool-down Not performed (comment)    Resistance Training Performed Yes    VAD Patient? No    PAD/SET Patient? No      Pain Assessment   Currently in Pain? No/denies    Multiple Pain Sites No             Capillary Blood Glucose: No results found. However, due to the size of the patient record, not all encounters were searched. Please check Results Review for a complete set of results.    Social History   Tobacco Use  Smoking Status Never  Smokeless Tobacco Never    Goals Met:  Exercise tolerated well No report of concerns or symptoms today Strength training completed today  Goals Unmet:  Not Applicable  Comments: Service time is from 1006 to 1136    Dr. Mechele Collin is Medical Director for Pulmonary Rehab at [facility].

## 2023-01-06 ENCOUNTER — Ambulatory Visit: Payer: Medicare Other | Admitting: Internal Medicine

## 2023-01-06 ENCOUNTER — Encounter (HOSPITAL_COMMUNITY)
Admission: RE | Admit: 2023-01-06 | Discharge: 2023-01-06 | Disposition: A | Discharge status: Home / Self Care | Payer: Medicare Other | Source: Ambulatory Visit | Attending: Internal Medicine | Admitting: Internal Medicine

## 2023-01-06 DIAGNOSIS — J4550 Severe persistent asthma, uncomplicated: Secondary | ICD-10-CM | POA: Diagnosis not present

## 2023-01-06 NOTE — Progress Notes (Signed)
Daily Session Note  Patient Details  Name: [name] MRN: [id number] Date of Birth: [birth year] Referring Provider:   Doristine Devoid Pulmonary Rehab Walk Test from 12/26/2022 in [facility]  Referring Provider Ramawamy       Encounter Date: 01/06/2023  Check In:  Session Check In - 01/06/23 1200       Check-In   Supervising physician immediately available to respond to emergencies [facility] MD immediately available    Physician(s) Carlos Levering, NP    [facility]    Staff Present Raford Pitcher, MS, ACSM-CEP, Exercise Physiologist;Casey Synthia Innocent, RN, BSN;Randi Reeve BS, ACSM-CEP, Exercise Physiologist;Samantha Belarus, RD, LDN    Virtual Visit No    Medication changes reported     No    Fall or balance concerns reported    No    Tobacco Cessation No Change    Warm-up and Cool-down Performed as group-led instruction    Resistance Training Performed Yes    VAD Patient? No    PAD/SET Patient? No      Pain Assessment   Currently in Pain? No/denies    Multiple Pain Sites No             Capillary Blood Glucose: No results found. However, due to the size of the patient record, not all encounters were searched. Please check Results Review for a complete set of results.    Social History   Tobacco Use  Smoking Status Never  Smokeless Tobacco Never    Goals Met:  Proper associated with RPD/PD & O2 Sat Exercise tolerated well No report of concerns or symptoms today Strength training completed today  Goals Unmet:  Not Applicable  Comments: Service time is from 1007 to 1145.    Dr. Mechele Collin is Medical Director for Pulmonary Rehab at [facility].

## 2023-01-08 ENCOUNTER — Encounter (HOSPITAL_COMMUNITY)
Admission: RE | Admit: 2023-01-08 | Discharge: 2023-01-08 | Disposition: A | Discharge status: Home / Self Care | Payer: Medicare Other | Source: Ambulatory Visit | Attending: Internal Medicine | Admitting: Internal Medicine

## 2023-01-08 DIAGNOSIS — J4550 Severe persistent asthma, uncomplicated: Secondary | ICD-10-CM | POA: Diagnosis not present

## 2023-01-08 NOTE — Progress Notes (Signed)
Daily Session Note  Patient Details  Name: [name] MRN: [id number] Date of Birth: 08/14/1932 Referring Provider:   Doristine Devoid Pulmonary Rehab Walk Test from 12/26/2022 in [facility]  Referring Provider Ramawamy       Encounter Date: 01/08/2023  Check In:  Session Check In - 01/08/23 1135       Check-In   Supervising physician immediately available to respond to emergencies [facility] MD immediately available    Physician(s) Bernadene Person, NP    [facility]    Staff Present Raford Pitcher, MS, ACSM-CEP, Exercise Physiologist;Desmond Tufano Erin Sons BS, ACSM-CEP, Exercise Physiologist;Maria Whitaker, RN, BSN    Medication changes reported     No    Fall or balance concerns reported    No    Tobacco Cessation No Change    Warm-up and Cool-down Performed as group-led instruction    Resistance Training Performed Yes    VAD Patient? No    PAD/SET Patient? No      Pain Assessment   Currently in Pain? No/denies             Capillary Blood Glucose: No results found. However, due to the size of the patient record, not all encounters were searched. Please check Results Review for a complete set of results.    Social History   Tobacco Use  Smoking Status Never  Smokeless Tobacco Never    Goals Met:  Proper associated with RPD/PD & O2 Sat Independence with exercise equipment Exercise tolerated well No report of concerns or symptoms today Strength training completed today  Goals Unmet:  Not Applicable  Comments: Service time is from 1012 to 1140.    Dr. Mechele Collin is Medical Director for Pulmonary Rehab at [facility].

## 2023-01-13 ENCOUNTER — Encounter (HOSPITAL_COMMUNITY): Payer: Self-pay

## 2023-01-13 ENCOUNTER — Encounter (HOSPITAL_COMMUNITY)
Admission: RE | Admit: 2023-01-13 | Discharge: 2023-01-13 | Disposition: A | Discharge status: Home / Self Care | Payer: Medicare Other | Source: Ambulatory Visit | Attending: Internal Medicine | Admitting: Internal Medicine

## 2023-01-13 VITALS — Wt 138.4 lb

## 2023-01-13 DIAGNOSIS — J4550 Severe persistent asthma, uncomplicated: Secondary | ICD-10-CM

## 2023-01-13 NOTE — Progress Notes (Signed)
Daily Session Note  Patient Details  Name: [name] MRN: [id number] Date of Birth: 07/07/1932 Referring Provider:   Doristine Devoid Pulmonary Rehab Walk Test from 12/26/2022 in [facility]  Referring Provider Ramawamy       Encounter Date: 01/13/2023  Check In:  Session Check In - 01/13/23 1128       Check-In   Supervising physician immediately available to respond to emergencies [facility] MD immediately available    Physician(s) Bernadene Person, NP    [facility]    Staff Present Raford Pitcher, MS, ACSM-CEP, Exercise Physiologist;Casey Erin Sons BS, ACSM-CEP, Exercise Physiologist;Samantha Belarus, RD, [place] Gray, RN, BSN    Virtual Visit No    Medication changes reported     No    Fall or balance concerns reported    No    Tobacco Cessation No Change    Warm-up and Cool-down Performed as group-led Writer Performed Yes    VAD Patient? No    PAD/SET Patient? No      Pain Assessment   Currently in Pain? No/denies    Multiple Pain Sites No             Capillary Blood Glucose: No results found. However, due to the size of the patient record, not all encounters were searched. Please check Results Review for a complete set of results.   Exercise Prescription Changes - 01/13/23 1200       Response to Exercise   Blood Pressure (Admit) 138/66    Blood Pressure (Exercise) 150/68    Blood Pressure (Exit) 140/62    Heart Rate (Admit) 87 bpm    Heart Rate (Exercise) 97 bpm    Heart Rate (Exit) 89 bpm    Oxygen Saturation (Admit) 95 %    Oxygen Saturation (Exercise) 95 %    Oxygen Saturation (Exit) 96 %    Rating of Perceived Exertion (Exercise) 13    Perceived Dyspnea (Exercise) 2    Duration Progress to 30 minutes of  aerobic without signs/symptoms of physical distress    Intensity THRR unchanged      Resistance Training   Training Prescription Yes     Weight red bands    Reps 10-15    Time 10 Minutes      NuStep   Level 1    Minutes 15    METs 2.6      Track   Laps 8    Minutes 15    METs 2.23             Social History   Tobacco Use  Smoking Status Never  Smokeless Tobacco Never    Goals Met:  Exercise tolerated well No report of concerns or symptoms today Strength training completed today  Goals Unmet:  Not Applicable  Comments: Service time is from 1015 to 1146    Dr. Mechele Collin is Medical Director for Pulmonary Rehab at [facility].

## 2023-01-14 NOTE — Progress Notes (Signed)
Pulmonary Individual Treatment Plan  Patient Details  Name: [name] MRN: [id number] Date of Birth: Jul 14, 1932 Referring Provider:   Doristine Devoid Pulmonary Rehab Walk Test from 12/26/2022 in [facility]  Referring Provider Ramawamy       Initial Encounter Date:  Flowsheet Row Pulmonary Rehab Walk Test from 12/26/2022 in [facility]  Date 12/26/22       Visit Diagnosis: Severe persistent asthma without complication  Patient's Home Medications on Admission:   Current Outpatient Medications:    Abaloparatide (TYMLOS) 3120 MCG/1.56ML SOPN, Inject Subcutaneous each morning, Disp: , Rfl:    albuterol (PROVENTIL) (2.5 MG/3ML) 0.083% nebulizer solution, Take 3 mLs (2.5 mg total) by nebulization every 6 (six) hours as needed for wheezing or shortness of breath., Disp: 75 mL, Rfl: 12   albuterol (VENTOLIN HFA) 108 (90 Base) MCG/ACT inhaler, TAKE 2 PUFFS BY MOUTH EVERY 6 HOURS AS NEEDED FOR WHEEZE OR SHORTNESS OF BREATH, Disp: 8.5 each, Rfl: 5   budesonide-formoterol (SYMBICORT) 160-4.5 MCG/ACT inhaler, Inhale 2 puffs into the lungs in the morning and at bedtime., Disp: 10.2 g, Rfl: 5   Calcium Carbonate-Vitamin D 600-400 MG-UNIT tablet, Take 1 tablet by mouth daily., Disp: , Rfl:    chlorpheniramine (CHLOR-TRIMETON) 4 MG tablet, Take 4-8 mg by mouth See admin instructions. 4 mg in the morning and 8 mg at bedtime, Disp: , Rfl:    Cholecalciferol (VITAMIN D3) 1000 UNITS tablet, Take 1,000 Units by mouth every evening. , Disp: , Rfl:    dextromethorphan (DELSYM) 30 MG/5ML liquid, Take 60 mg by mouth 2 (two) times daily as needed for cough., Disp: , Rfl:    docusate sodium (COLACE) 100 MG capsule, Take 1 capsule (100 mg total) by mouth 2 (two) times daily., Disp: 10 capsule, Rfl: 0   dupilumab (DUPIXENT) 200 MG/1. prefilled syringe, Inject 200 mg into the skin every  14 (fourteen) days., Disp: 6.84 mL, Rfl: 2   fexofenadine (ALLEGRA) 180 MG tablet, Take 180 mg by mouth daily.  , Disp: , Rfl:    furosemide (LASIX) 20 MG tablet, TAKE 1 TABLET (20 MG TOTAL) BY MOUTH DAILY AS NEEDED FOR FLUID OR EDEMA., Disp: 90 tablet, Rfl: 2   ipratropium (ATROVENT) 0.06 % nasal spray, Place 2 sprays into both nostrils 2 (two) times daily., Disp: 15 mL, Rfl: 6   mirtazapine (REMERON) 15 MG tablet, Take 15 mg by mouth at bedtime., Disp: , Rfl:    mometasone (NASONEX) 50 MCG/ACT nasal spray, USE 2 SPRAYS NASALLY 2 TIMES DAILY, Disp: 17 g, Rfl: 11   montelukast (SINGULAIR) 10 MG tablet, TAKE 1 TABLET BY MOUTH EVERYDAY AT BEDTIME (Patient not taking: Reported on 12/26/2022), Disp: 90 tablet, Rfl: 2   Multiple Vitamin (MULTIVITAMIN WITH MINERALS) TABS tablet, Take 1 tablet by mouth daily. Centrum Silver Multivitamin, Disp: , Rfl:    multivitamin-lutein (OCUVITE-LUTEIN) CAPS capsule, Take 1 capsule by mouth daily., Disp: , Rfl:    mupirocin ointment (BACTROBAN) 2 %, as needed. , Disp: , Rfl:    nitroGLYCERIN (NITROSTAT) 0.4 MG SL tablet, Place 1 tablet (0.4 mg total) under the tongue every 5 (five) minutes as needed for chest pain. (Patient not taking: Reported on 12/26/2022), Disp: 25 tablet, Rfl: 3   pantoprazole (PROTONIX) 40 MG tablet, Take 40 mg by mouth daily., Disp: , Rfl:    potassium chloride (KLOR-CON) 10 MEQ tablet, Take 1 tablet (10 mEq  total) by mouth daily., Disp: 90 tablet, Rfl: 3   pramipexole (MIRAPEX) 0.125 MG tablet, Take 0.375 mg by mouth at bedtime., Disp: , Rfl:    rivaroxaban (XARELTO) 10 MG TABS tablet, Take 10 mg by mouth daily., Disp: , Rfl:    rosuvastatin (CRESTOR) 5 MG tablet, TAKE 1 TABLET BY MOUTH EVERYDAY AT BEDTIME, Disp: 90 tablet, Rfl: 3   Spacer/Aero-Holding Chambers (AEROCHAMBER MV) inhaler, by Other route. Use as instructed , Disp: , Rfl:    telmisartan (MICARDIS) 20 MG tablet, Take 20 mg by mouth daily. , Disp: , Rfl:    vitamin B-12  (CYANOCOBALAMIN) 500 MCG tablet, Take 500 mcg by mouth daily.  , Disp: , Rfl:   Current Facility-Administered Medications:    albuterol (PROVENTIL) (2.5 MG/3ML) 0.083% nebulizer solution 2.5 mg, 2.5 mg, Nebulization, Q6H PRN, Bevelyn Ngo, NP, 2.5 mg at 10/08/21 0918   albuterol (VENTOLIN HFA) 108 (90 Base) MCG/ACT inhaler 2 puff, 2 puff, Inhalation, Once PRN, Causey, Larna Daughters, NP   diphenhydrAMINE (BENADRYL) injection 50 mg, 50 mg, Intramuscular, Once PRN, Causey, Larna Daughters, NP   EPINEPHrine (EPI-PEN) injection 0.3 mg, 0.3 mg, Intramuscular, Once PRN, Causey, Larna Daughters, NP  Past Medical History: Past Medical History:  Diagnosis Date   Anemia    after hysterectomy   Angio-edema    Asthma, severe persistent    pulmologist-- Ramaswamy   Cancer [facility])    Breast cancer on right   Chronic kidney disease, stage II (mild)    DDD (degenerative disc disease), cervical    Diverticulitis    Diverticulosis yrs ago   hx of    Edema, lower extremity    occ both legs swell   GERD (gastroesophageal reflux disease)    Headache    sinus headaches   Heart murmur    no problems - per pt   History of breast cancer 1990 left mastectomy also   1989  S/P   RIGHT MASTECTOMY ;  NO CHEMORADIATION //   NO RECURRENCE   History of DVT of lower extremity 5 yrs ago   right leg   History of shingles    Hyperlipidemia    Internal hemorrhoid    Leg ulcer, left (HCC)    Osteoporosis, unspecified    Knee and hip osteoarthritis bilaterally   Perennial allergic rhinitis    Peripheral vascular disease (HCC)    Pneumonia    Restless legs syndrome (RLS)    Rheumatoid arthritis (HCC)    hands   Thyroid nodule    followed by dr perrini yearly, no current problam   Unspecified essential hypertension    Urticaria     Tobacco Use: Social History   Tobacco Use  Smoking Status Never  Smokeless Tobacco Never    Labs: Review Flowsheet       Latest Ref Rng & Units 11/10/2011  03/03/2013 02/23/2014 03/19/2017  Labs for ITP Cardiac and Pulmonary Rehab  Cholestrol 100 - 199 mg/dL 161  096  045  409   LDL (calc) 0 - 99 mg/dL 49  62  79  61   HDL-C >39 mg/dL 81.19  147.82  95.62  130   Trlycerides 0 - 149 mg/dL 86.5  78.4  69.6  57     Details            Capillary Blood Glucose: No results found for: "GLUCAP"   Pulmonary Assessment Scores:  Pulmonary Assessment Scores     Row Name 12/26/22 [phone]  ADL UCSD   ADL Phase Entry     SOB Score total 46       CAT Score   CAT Score 19       mMRC Score   mMRC Score 3             UCSD: Self-administered rating of dyspnea associated with activities of daily living (ADLs) 6-point scale (0 = "not at all" to 5 = "maximal or unable to do because of breathlessness")  Scoring Scores range from 0 to 120.  Minimally important difference is 5 units  CAT: CAT can identify the health impairment of COPD patients and is better correlated with disease progression.  CAT has a scoring range of zero to 40. The CAT score is classified into four groups of low (less than 10), medium (10 - 20), high (21-30) and very high (31-40) based on the impact level of disease on health status. A CAT score over 10 suggests significant symptoms.  A worsening CAT score could be explained by an exacerbation, poor medication adherence, poor inhaler technique, or progression of COPD or comorbid conditions.  CAT MCID is 2 points  mMRC: mMRC (Modified Medical Research Council) Dyspnea Scale is used to assess the degree of baseline functional disability in patients of respiratory disease due to dyspnea. No minimal important difference is established. A decrease in score of 1 point or greater is considered a positive change.   Pulmonary Function Assessment:  Pulmonary Function Assessment - 12/26/22 1051       Breath   Bilateral Breath Sounds Clear    Shortness of Breath Yes;Limiting activity             Exercise Target  Goals: Exercise Program Goal: Individual exercise prescription set using results from initial 6 min walk test and THRR while considering  patient's activity barriers and safety.   Exercise Prescription Goal: Initial exercise prescription builds to 30-45 minutes a day of aerobic activity, 2-3 days per week.  Home exercise guidelines will be given to patient during program as part of exercise prescription that the participant will acknowledge.  Activity Barriers & Risk Stratification:  Activity Barriers & Cardiac Risk Stratification - 12/26/22 0924       Activity Barriers & Cardiac Risk Stratification   Activity Barriers Arthritis;Back Problems;Deconditioning;Muscular Weakness;Shortness of Breath;Right Hip Replacement;Joint Problems    Comments waiting for right shoulder replacement             6 Minute Walk:  6 Minute Walk     Row Name 12/26/22 1042         6 Minute Walk   Phase Initial     Distance 1022 feet     Walk Time 6 minutes     # of Rest Breaks 2  3:10-3:37, 5:37-6:00     MPH 1.97     METS 1.52     RPE 13     Perceived Dyspnea  2     VO2 Peak 5.34     Symptoms No     Resting HR 80 bpm     Resting BP 140/74     Resting Oxygen Saturation  100 %     Exercise Oxygen Saturation  during 6 min walk 95 %     Max Ex. HR 89 bpm     Max Ex. BP 150/80     2 Minute Post BP 148/78       Interval HR   1 Minute HR 82  2 Minute HR 86     3 Minute HR 86     4 Minute HR 85     5 Minute HR 89     6 Minute HR 89     2 Minute Post HR 85     Interval Heart Rate? Yes       Interval Oxygen   Interval Oxygen? Yes     Baseline Oxygen Saturation % 100 %     1 Minute Oxygen Saturation % 98 %     1 Minute Liters of Oxygen 0 L     2 Minute Oxygen Saturation % 95 %     2 Minute Liters of Oxygen 0 L     3 Minute Oxygen Saturation % 97 %     3 Minute Liters of Oxygen 0 L     4 Minute Oxygen Saturation % 99 %     4 Minute Liters of Oxygen 0 L     5 Minute Oxygen Saturation  % 100 %     5 Minute Liters of Oxygen 0 L     6 Minute Oxygen Saturation % 96 %     6 Minute Liters of Oxygen 0 L     2 Minute Post Oxygen Saturation % 97 %     2 Minute Post Liters of Oxygen 0 L              Oxygen Initial Assessment:  Oxygen Initial Assessment - 12/26/22 0927       Home Oxygen   Home Oxygen Device None    Sleep Oxygen Prescription None    Home Exercise Oxygen Prescription None    Home Resting Oxygen Prescription None      Initial 6 min Walk   Oxygen Used None      Program Oxygen Prescription   Program Oxygen Prescription None      Intervention   Short Term Goals To learn and understand importance of maintaining oxygen saturations>88%;To learn and demonstrate proper use of respiratory medications;To learn and understand importance of monitoring SPO2 with pulse oximeter and demonstrate accurate use of the pulse oximeter.;To learn and demonstrate proper pursed lip breathing techniques or other breathing techniques.     Long  Term Goals Verbalizes importance of monitoring SPO2 with pulse oximeter and return demonstration;Maintenance of O2 saturations>88%;Exhibits proper breathing techniques, such as pursed lip breathing or other method taught during program session;Compliance with respiratory medication;Demonstrates proper use of MDI's             Oxygen Re-Evaluation:  Oxygen Re-Evaluation     Row Name 01/07/23 0950             Program Oxygen Prescription   Program Oxygen Prescription None         Home Oxygen   Home Oxygen Device None       Sleep Oxygen Prescription None       Home Exercise Oxygen Prescription None       Home Resting Oxygen Prescription None         Goals/Expected Outcomes   Short Term Goals To learn and understand importance of maintaining oxygen saturations>88%;To learn and demonstrate proper use of respiratory medications;To learn and understand importance of monitoring SPO2 with pulse oximeter and demonstrate accurate use  of the pulse oximeter.;To learn and demonstrate proper pursed lip breathing techniques or other breathing techniques.        Long  Term Goals Verbalizes importance of monitoring SPO2 with pulse oximeter and  return demonstration;Maintenance of O2 saturations>88%;Exhibits proper breathing techniques, such as pursed lip breathing or other method taught during program session;Compliance with respiratory medication;Demonstrates proper use of MDI's       Goals/Expected Outcomes Compliance and understanding of oxygen saturation monitoring and breathing techniques to decrease shortness of breath.                Oxygen Discharge (Final Oxygen Re-Evaluation):  Oxygen Re-Evaluation - 01/07/23 0950       Program Oxygen Prescription   Program Oxygen Prescription None      Home Oxygen   Home Oxygen Device None    Sleep Oxygen Prescription None    Home Exercise Oxygen Prescription None    Home Resting Oxygen Prescription None      Goals/Expected Outcomes   Short Term Goals To learn and understand importance of maintaining oxygen saturations>88%;To learn and demonstrate proper use of respiratory medications;To learn and understand importance of monitoring SPO2 with pulse oximeter and demonstrate accurate use of the pulse oximeter.;To learn and demonstrate proper pursed lip breathing techniques or other breathing techniques.     Long  Term Goals Verbalizes importance of monitoring SPO2 with pulse oximeter and return demonstration;Maintenance of O2 saturations>88%;Exhibits proper breathing techniques, such as pursed lip breathing or other method taught during program session;Compliance with respiratory medication;Demonstrates proper use of MDI's    Goals/Expected Outcomes Compliance and understanding of oxygen saturation monitoring and breathing techniques to decrease shortness of breath.             Initial Exercise Prescription:  Initial Exercise Prescription - 12/26/22 1000       Date of  Initial Exercise RX and Referring Provider   Date 12/26/22    Referring Provider Ramawamy    Expected Discharge Date 03/26/23      NuStep   Level 1    SPM 50    Minutes 30    METs 1.5      Prescription Details   Frequency (times per week) 2    Duration Progress to 30 minutes of continuous aerobic without signs/symptoms of physical distress      Intensity   THRR 40-80% of Max Heartrate 53-106    Ratings of Perceived Exertion 11-13    Perceived Dyspnea 0-4      Progression   Progression Continue to progress workloads to maintain intensity without signs/symptoms of physical distress.      Resistance Training   Training Prescription Yes    Weight red bands    Reps 10-15             Perform Capillary Blood Glucose checks as needed.  Exercise Prescription Changes:   Exercise Prescription Changes     Row Name 01/13/23 1200             Response to Exercise   Blood Pressure (Admit) 138/66       Blood Pressure (Exercise) 150/68       Blood Pressure (Exit) 140/62       Heart Rate (Admit) 87 bpm       Heart Rate (Exercise) 97 bpm       Heart Rate (Exit) 89 bpm       Oxygen Saturation (Admit) 95 %       Oxygen Saturation (Exercise) 95 %       Oxygen Saturation (Exit) 96 %       Rating of Perceived Exertion (Exercise) 13       Perceived Dyspnea (Exercise) 2  Duration Progress to 30 minutes of  aerobic without signs/symptoms of physical distress       Intensity THRR unchanged         Resistance Training   Training Prescription Yes       Weight red bands       Reps 10-15       Time 10 Minutes         NuStep   Level 1       Minutes 15       METs 2.6         Track   Laps 8       Minutes 15       METs 2.23                Exercise Comments:   Exercise Comments     Row Name 01/01/23 0948           Exercise Comments Pt completed first day of exercise. Babbie exercised for 30 min on the Nustep. She averaged 2.2 METs at level 1 on the Nustep.  Kensy performed the warmup and cooldown standing holding onto a chair for balance with verbal cues. Discussed METs and how to increase METs.                Exercise Goals and Review:   Exercise Goals     Row Name 12/26/22 1038 01/07/23 0946           Exercise Goals   Increase Physical Activity Yes Yes      Intervention Provide advice, education, support and counseling about physical activity/exercise needs.;Develop an individualized exercise prescription for aerobic and resistive training based on initial evaluation findings, risk stratification, comorbidities and participant's personal goals. Provide advice, education, support and counseling about physical activity/exercise needs.;Develop an individualized exercise prescription for aerobic and resistive training based on initial evaluation findings, risk stratification, comorbidities and participant's personal goals.      Expected Outcomes Short Term: Attend rehab on a regular basis to increase amount of physical activity.;Long Term: Exercising regularly at least 3-5 days a week.;Long Term: Add in home exercise to make exercise part of routine and to increase amount of physical activity. Short Term: Attend rehab on a regular basis to increase amount of physical activity.;Long Term: Exercising regularly at least 3-5 days a week.;Long Term: Add in home exercise to make exercise part of routine and to increase amount of physical activity.      Increase Strength and Stamina Yes Yes      Intervention Provide advice, education, support and counseling about physical activity/exercise needs.;Develop an individualized exercise prescription for aerobic and resistive training based on initial evaluation findings, risk stratification, comorbidities and participant's personal goals. Provide advice, education, support and counseling about physical activity/exercise needs.;Develop an individualized exercise prescription for aerobic and resistive training  based on initial evaluation findings, risk stratification, comorbidities and participant's personal goals.      Expected Outcomes Short Term: Increase workloads from initial exercise prescription for resistance, speed, and METs.;Short Term: Perform resistance training exercises routinely during rehab and add in resistance training at home;Long Term: Improve cardiorespiratory fitness, muscular endurance and strength as measured by increased METs and functional capacity ( ) Short Term: Increase workloads from initial exercise prescription for resistance, speed, and METs.;Short Term: Perform resistance training exercises routinely during rehab and add in resistance training at home;Long Term: Improve cardiorespiratory fitness, muscular endurance and strength as measured by increased METs and functional capacity ( )  Able to understand and use rate of perceived exertion (RPE) scale Yes Yes      Intervention Provide education and explanation on how to use RPE scale Provide education and explanation on how to use RPE scale      Expected Outcomes Short Term: Able to use RPE daily in rehab to express subjective intensity level;Long Term:  Able to use RPE to guide intensity level when exercising independently Short Term: Able to use RPE daily in rehab to express subjective intensity level;Long Term:  Able to use RPE to guide intensity level when exercising independently      Able to understand and use Dyspnea scale Yes Yes      Intervention Provide education and explanation on how to use Dyspnea scale Provide education and explanation on how to use Dyspnea scale      Expected Outcomes Short Term: Able to use Dyspnea scale daily in rehab to express subjective sense of shortness of breath during exertion;Long Term: Able to use Dyspnea scale to guide intensity level when exercising independently Short Term: Able to use Dyspnea scale daily in rehab to express subjective sense of shortness of breath during  exertion;Long Term: Able to use Dyspnea scale to guide intensity level when exercising independently      Knowledge and understanding of Target Heart Rate Range (THRR) Yes Yes      Intervention Provide education and explanation of THRR including how the numbers were predicted and where they are located for reference Provide education and explanation of THRR including how the numbers were predicted and where they are located for reference      Expected Outcomes Short Term: Able to state/look up THRR;Long Term: Able to use THRR to govern intensity when exercising independently;Short Term: Able to use daily as guideline for intensity in rehab Short Term: Able to state/look up THRR;Long Term: Able to use THRR to govern intensity when exercising independently;Short Term: Able to use daily as guideline for intensity in rehab      Understanding of Exercise Prescription Yes Yes      Intervention Provide education, explanation, and written materials on patient's individual exercise prescription Provide education, explanation, and written materials on patient's individual exercise prescription      Expected Outcomes Short Term: Able to explain program exercise prescription;Long Term: Able to explain home exercise prescription to exercise independently Short Term: Able to explain program exercise prescription;Long Term: Able to explain home exercise prescription to exercise independently               Exercise Goals Re-Evaluation :  Exercise Goals Re-Evaluation     Row Name 01/07/23 0946             Exercise Goal Re-Evaluation   Exercise Goals Review Increase Physical Activity;Able to understand and use Dyspnea scale;Understanding of Exercise Prescription;Increase Strength and Stamina;Knowledge and understanding of Target Heart Rate Range (THRR);Able to understand and use rate of perceived exertion (RPE) scale       Comments Nacona has completed 2 exercise sessions. She exercises for 30 min on the  Nustep. Rozina averages 2.2 METs at level 1 on the Nustep. She performs the warmup and cooldown standing holding onto a chair with verbal cues. I am considering progressing her to 2x15 min exercise modes. Will discuss with pt next exercise session. It is too soon to note any discernable progressions. Will continue to monitor and progress as able.       Expected Outcomes Through exercise at rehab and home, the patient  will decrease shortness of breath with daily activities and feel confident in carrying out an exercise regimen at home.                Discharge Exercise Prescription (Final Exercise Prescription Changes):  Exercise Prescription Changes - 01/13/23 1200       Response to Exercise   Blood Pressure (Admit) 138/66    Blood Pressure (Exercise) 150/68    Blood Pressure (Exit) 140/62    Heart Rate (Admit) 87 bpm    Heart Rate (Exercise) 97 bpm    Heart Rate (Exit) 89 bpm    Oxygen Saturation (Admit) 95 %    Oxygen Saturation (Exercise) 95 %    Oxygen Saturation (Exit) 96 %    Rating of Perceived Exertion (Exercise) 13    Perceived Dyspnea (Exercise) 2    Duration Progress to 30 minutes of  aerobic without signs/symptoms of physical distress    Intensity THRR unchanged      Resistance Training   Training Prescription Yes    Weight red bands    Reps 10-15    Time 10 Minutes      NuStep   Level 1    Minutes 15    METs 2.6      Track   Laps 8    Minutes 15    METs 2.23             Nutrition:  Target Goals: Understanding of nutrition guidelines, daily intake of sodium 1500mg , cholesterol 200mg , calories 30% from fat and 7% or less from saturated fats, daily to have 5 or more servings of fruits and vegetables.  Biometrics:    Nutrition Therapy Plan and Nutrition Goals:  Nutrition Therapy & Goals - 01/01/23 1340       Nutrition Therapy   Diet General Healthy Diet    Drug/Food Interactions Statins/Certain Fruits      Personal Nutrition Goals    Nutrition Goal Patient to continue diet quality by using the plate method as a guide for meal planning to include lean protein/plant protein, fruits, vegetables, whole grains, nonfat dairy as part of a well-balanced diet.   goal in progress.   Comments Goal in progess. Ronie reports eating wide variety of foods focused on heart healthy cooking methods, fruit/vegetable intake, and lean protein sources. She does eat out on occasion. Her husband remains a good support. Patient will benefit from participation in pulmonary rehab for nutrition, exercise, and lifestyle modification.      Intervention Plan   Intervention Prescribe, educate and counsel regarding individualized specific dietary modifications aiming towards targeted core components such as weight, hypertension, lipid management, diabetes, heart failure and other comorbidities.;Nutrition handout(s) given to patient.    Expected Outcomes Short Term Goal: Understand basic principles of dietary content, such as calories, fat, sodium, cholesterol and nutrients.;Long Term Goal: Adherence to prescribed nutrition plan.             Nutrition Assessments:  Nutrition Assessments - 01/01/23 1500       Rate Your Plate Scores   Pre Score 50            MEDIFICTS Score Key: ?70 Need to make dietary changes  40-70 Heart Healthy Diet ? 40 Therapeutic Level Cholesterol Diet  Flowsheet Row PULMONARY REHAB OTHER RESPIRATORY from 01/01/2023 in [facility]  Picture Your Plate Total Score on Admission 50      Picture Your Plate Scores: <98 Unhealthy dietary  pattern with much room for improvement. 41-50 Dietary pattern unlikely to meet recommendations for good health and room for improvement. 51-60 More healthful dietary pattern, with some room for improvement.  >60 Healthy dietary pattern, although there may be some specific behaviors that could be improved.    Nutrition Goals  Re-Evaluation:  Nutrition Goals Re-Evaluation     Row Name 01/01/23 1340             Goals   Current Weight 138 lb 14.2 oz (63 kg)       Comment Cr 1.02, GFR 54       Expected Outcome Goal in progess. Lenix reports eating wide variety of foods focused on heart healthy cooking methods, fruit/vegetable intake, and lean protein sources. She does eat out on occasion. Her husband remains a good support. Patient will benefit from participation in pulmonary rehab for nutrition, exercise, and lifestyle modification.                Nutrition Goals Discharge (Final Nutrition Goals Re-Evaluation):  Nutrition Goals Re-Evaluation - 01/01/23 1340       Goals   Current Weight 138 lb 14.2 oz (63 kg)    Comment Cr 1.02, GFR 54    Expected Outcome Goal in progess. Berthel reports eating wide variety of foods focused on heart healthy cooking methods, fruit/vegetable intake, and lean protein sources. She does eat out on occasion. Her husband remains a good support. Patient will benefit from participation in pulmonary rehab for nutrition, exercise, and lifestyle modification.             Psychosocial: Target Goals: Acknowledge presence or absence of significant depression and/or stress, maximize coping skills, provide positive support system. Participant is able to verbalize types and ability to use techniques and skills needed for reducing stress and depression.  Initial Review & Psychosocial Screening:  Initial Psych Review & Screening - 12/26/22 0925       Initial Review   Current issues with None Identified      Family Dynamics   Good Support System? 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 She denies any psychosocial barriers to rehab      Barriers   Psychosocial barriers to participate in program There are no identifiable barriers or psychosocial needs.             Quality of Life Scores:  Scores of 19 and below usually indicate a poorer quality of life in  these areas.  A difference of  2-3 points is a clinically meaningful difference.  A difference of 2-3 points in the total score of the Quality of Life Index has been associated with significant improvement in overall quality of life, self-image, physical symptoms, and general health in studies assessing change in quality of life.  PHQ-9: Review Flowsheet       12/26/2022 01/01/2015  Depression screen PHQ 2/9  Decreased Interest 0 0  Down, Depressed, Hopeless 0 0  PHQ - 2 Score 0 0  Altered sleeping 3 -  Tired, decreased energy 1 -  Change in appetite 0 -  Feeling bad or failure about yourself  0 -  Trouble concentrating 0 -  Moving slowly or fidgety/restless 0 -  Suicidal thoughts 0 -  PHQ-9 Score 4 -  Difficult doing work/chores Somewhat difficult -    Details           Interpretation of Total Score  Total Score Depression Severity:  1-4 = Minimal depression,  5-9 = Mild depression, 10-14 = Moderate depression, 15-19 = Moderately severe depression, 20-27 = Severe depression   Psychosocial Evaluation and Intervention:  Psychosocial Evaluation - 12/26/22 0926       Psychosocial Evaluation & Interventions   Interventions Encouraged to exercise with the program and follow exercise prescription    Comments Judean denies any psychosocial barriers to rehab    Expected Outcomes For Olivine to participate in rehab free of psychosocial concerns.    Continue Psychosocial Services  No Follow up required             Psychosocial Re-Evaluation:  Psychosocial Re-Evaluation     Row Name 01/05/23 1350             Psychosocial Re-Evaluation   Current issues with None Identified       Comments No changes since starting the program. Zaeleigh has attended 1 class so far.       Expected Outcomes For patient to attend PR without any psychosocial barriers or concerns       Interventions Encouraged to attend Pulmonary Rehabilitation for the exercise       Continue Psychosocial  Services  No Follow up required                Psychosocial Discharge (Final Psychosocial Re-Evaluation):  Psychosocial Re-Evaluation - 01/05/23 1350       Psychosocial Re-Evaluation   Current issues with None Identified    Comments No changes since starting the program. Kadisha has attended 1 class so far.    Expected Outcomes For patient to attend PR without any psychosocial barriers or concerns    Interventions Encouraged to attend Pulmonary Rehabilitation for the exercise    Continue Psychosocial Services  No Follow up required             Education: Education Goals: Education classes will be provided on a weekly basis, covering required topics. Participant will state understanding/return demonstration of topics presented.  Learning Barriers/Preferences:  Learning Barriers/Preferences - 12/26/22 4098       Learning Barriers/Preferences   Learning Barriers Sight    Learning Preferences Skilled Demonstration;Video;Pictoral             Education Topics: Introduction to Pulmonary Rehab Group instruction provided by PowerPoint, verbal discussion, and written material to support subject matter. Instructor reviews what Pulmonary Rehab is, the purpose of the program, and how patients are referred.     Know Your Numbers Group instruction that is supported by a PowerPoint presentation. Instructor discusses importance of knowing and understanding resting, exercise, and post-exercise oxygen saturation, heart rate, and blood pressure. Oxygen saturation, heart rate, blood pressure, rating of perceived exertion, and dyspnea are reviewed along with a normal range for these values.    Exercise for the Pulmonary Patient Group instruction that is supported by a PowerPoint presentation. Instructor discusses benefits of exercise, core components of exercise, frequency, duration, and intensity of an exercise routine, importance of utilizing pulse oximetry during exercise, safety  while exercising, and options of places to exercise outside of rehab.       MET Level  Group instruction provided by PowerPoint, verbal discussion, and written material to support subject matter. Instructor reviews what METs are and how to increase METs.  Flowsheet Row PULMONARY REHAB OTHER RESPIRATORY from 01/08/2023 in [facility], Vascular, & Lung Health  Date 01/08/23  Educator EP  Instruction Review Code 1- Verbalizes Understanding       Pulmonary Medications Verbally interactive  group education provided by instructor with focus on inhaled medications and proper administration.   Anatomy and Physiology of the Respiratory System Group instruction provided by PowerPoint, verbal discussion, and written material to support subject matter. Instructor reviews respiratory cycle and anatomical components of the respiratory system and their functions. Instructor also reviews differences in obstructive and restrictive respiratory diseases with examples of each.    Oxygen Safety Group instruction provided by PowerPoint, verbal discussion, and written material to support subject matter. There is an overview of "What is Oxygen" and "Why do we need it".  Instructor also reviews how to create a safe environment for oxygen use, the importance of using oxygen as prescribed, and the risks of noncompliance. There is a brief discussion on traveling with oxygen and resources the patient may utilize.   Oxygen Use Group instruction provided by PowerPoint, verbal discussion, and written material to discuss how supplemental oxygen is prescribed and different types of oxygen supply systems. Resources for more information are provided.    Breathing Techniques Group instruction that is supported by demonstration and informational handouts. Instructor discusses the benefits of pursed lip and diaphragmatic breathing and detailed demonstration on how to perform both.      Risk Factor Reduction Group instruction that is supported by a PowerPoint presentation. Instructor discusses the definition of a risk factor, different risk factors for pulmonary disease, and how the heart and lungs work together. Flowsheet Row PULMONARY REHAB OTHER RESPIRATORY from 01/01/2023 in [facility] for Heart, Vascular, & Lung Health  Date 01/01/23  Educator Ep  Instruction Review Code 1- Verbalizes Understanding       MD Day A group question and answer session with a medical doctor that allows participants to ask questions that relate to their pulmonary disease state.   Nutrition for the Pulmonary Patient Group instruction provided by PowerPoint slides, verbal discussion, and written materials to support subject matter. The instructor gives an explanation and review of healthy diet recommendations, which includes a discussion on weight management, recommendations for fruit and vegetable consumption, as well as protein, fluid, caffeine, fiber, sodium, sugar, and alcohol. Tips for eating when patients are short of breath are discussed.    Other Education Group or individual verbal, written, or video instructions that support the educational goals of the pulmonary rehab program.    Knowledge Questionnaire Score:  Knowledge Questionnaire Score - 12/26/22 1036       Knowledge Questionnaire Score   Pre Score 16/18             Core Components/Risk Factors/Patient Goals at Admission:  Personal Goals and Risk Factors at Admission - 12/26/22 0927       Core Components/Risk Factors/Patient Goals on Admission    Weight Management Weight Loss;Yes    Intervention Weight Management: Develop a combined nutrition and exercise program designed to reach desired caloric intake, while maintaining appropriate intake of nutrient and fiber, sodium and fats, and appropriate energy expenditure required for the weight goal.;Weight Management: Provide education and  appropriate resources to help participant work on and attain dietary goals.;Weight Management/Obesity: Establish reasonable short term and long term weight goals.;Obesity: Provide education and appropriate resources to help participant work on and attain dietary goals.    Expected Outcomes Short Term: Continue to assess and modify interventions until short term weight is achieved;Long Term: Adherence to nutrition and physical activity/exercise program aimed toward attainment of established weight goal;Weight Maintenance: Understanding of the daily nutrition guidelines, which includes 25-35% calories from fat, 7%  or less cal from saturated fats, less than 200mg  cholesterol, less than 1.5gm of sodium, & 5 or more servings of fruits and vegetables daily;Weight Loss: Understanding of general recommendations for a balanced deficit meal plan, which promotes 1-2 lb weight loss per week and includes a negative energy balance of [id number] kcal/d;Understanding recommendations for meals to include 15-35% energy as protein, 25-35% energy from fat, 35-60% energy from carbohydrates, less than 200mg  of dietary cholesterol, 20-35 gm of total fiber daily;Understanding of distribution of calorie intake throughout the day with the consumption of 4-5 meals/snacks;Weight Gain: Understanding of general recommendations for a high calorie, high protein meal plan that promotes weight gain by distributing calorie intake throughout the day with the consumption for 4-5 meals, snacks, and/or supplements    Improve shortness of breath with ADL's Yes    Intervention Provide education, individualized exercise plan and daily activity instruction to help decrease symptoms of SOB with activities of daily living.    Expected Outcomes Short Term: Improve cardiorespiratory fitness to achieve a reduction of symptoms when performing ADLs;Long Term: Be able to perform more ADLs without symptoms or delay the onset of symptoms    Heart Failure Yes     Intervention Provide a combined exercise and nutrition program that is supplemented with education, support and counseling about heart failure. Directed toward relieving symptoms such as shortness of breath, decreased exercise tolerance, and extremity edema.    Expected Outcomes Improve functional capacity of life;Short term: Attendance in program 2-3 days a week with increased exercise capacity. Reported lower sodium intake. Reported increased fruit and vegetable intake. Reports medication compliance.;Short term: Daily weights obtained and reported for increase. Utilizing diuretic protocols set by physician.;Long term: Adoption of self-care skills and reduction of barriers for early signs and symptoms recognition and intervention leading to self-care maintenance.             Core Components/Risk Factors/Patient Goals Review:   Goals and Risk Factor Review     Row Name 01/05/23 1351             Core Components/Risk Factors/Patient Goals Review   Personal Goals Review Weight Management/Obesity;Heart Failure;Improve shortness of breath with ADL's;Develop more efficient breathing techniques such as purse lipped breathing and diaphragmatic breathing and practicing self-pacing with activity.       Review Unable to evaluate progress yet. Adaly has attended 1 class so far.       Expected Outcomes For Mailyn to lose weight, improve her shortness of breath with ADLs, develop more efficient breathing techniques, and have no HF exacerbations during her time in PR                Core Components/Risk Factors/Patient Goals at Discharge (Final Review):   Goals and Risk Factor Review - 01/05/23 1351       Core Components/Risk Factors/Patient Goals Review   Personal Goals Review Weight Management/Obesity;Heart Failure;Improve shortness of breath with ADL's;Develop more efficient breathing techniques such as purse lipped breathing and diaphragmatic breathing and practicing self-pacing with  activity.    Review Unable to evaluate progress yet. Aprill has attended 1 class so far.    Expected Outcomes For Mailyn to lose weight, improve her shortness of breath with ADLs, develop more efficient breathing techniques, and have no HF exacerbations during her time in PR             ITP Comments:Pt is making expected progress toward Pulmonary Rehab goals after completing 4 sessions. Recommend continued exercise, life style modification, education,  and utilization of breathing techniques to increase stamina and strength, while also decreasing shortness of breath with exertion.  Dr. Mechele Collin is Medical Director for Pulmonary Rehab at [facility].     Comments: Dr. Mechele Collin is Medical Director for Pulmonary Rehab at [facility].

## 2023-01-15 ENCOUNTER — Encounter (HOSPITAL_COMMUNITY)
Admission: RE | Admit: 2023-01-15 | Discharge: 2023-01-15 | Disposition: A | Discharge status: Home / Self Care | Payer: Medicare Other | Source: Ambulatory Visit | Attending: Internal Medicine | Admitting: Internal Medicine

## 2023-01-15 DIAGNOSIS — J4550 Severe persistent asthma, uncomplicated: Secondary | ICD-10-CM

## 2023-01-15 NOTE — Progress Notes (Signed)
Daily Session Note  Patient Details  Name: [name] MRN: [id number] Date of Birth: Sep 16, 1932 Referring Provider:   Doristine Devoid Pulmonary Rehab Walk Test from 12/26/2022 in [facility] for Heart, Vascular, & Lung Health  Referring Provider Ramawamy       Encounter Date: 01/15/2023  Check In:  Session Check In - 01/15/23 1220       Check-In   Supervising physician immediately available to respond to emergencies [facility] MD immediately available    Physician(s) Metta Clines, NP    Location [facility]    Staff Present Raford Pitcher, MS, ACSM-CEP, Exercise Physiologist;Cynithia Hakimi Erin Sons BS, ACSM-CEP, Exercise Physiologist;Samantha Belarus, RD, Dutch Gray, RN, BSN    Virtual Visit No    Medication changes reported     No    Fall or balance concerns reported    No    Tobacco Cessation No Change    Warm-up and Cool-down Performed as group-led instruction    Resistance Training Performed Yes    VAD Patient? No    PAD/SET Patient? No      Pain Assessment   Currently in Pain? No/denies    Multiple Pain Sites No             Capillary Blood Glucose: No results found. However, due to the size of the patient record, not all encounters were searched. Please check Results Review for a complete set of results.    Social History   Tobacco Use  Smoking Status Never  Smokeless Tobacco Never    Goals Met:  Proper associated with RPD/PD & O2 Sat Independence with exercise equipment Exercise tolerated well No report of concerns or symptoms today Strength training completed today  Goals Unmet:  Not Applicable  Comments: Service time is from 1006 to 1151.    Dr. Mechele Collin is Medical Director for Pulmonary Rehab at [facility].

## 2023-01-20 ENCOUNTER — Encounter (HOSPITAL_COMMUNITY)
Admission: RE | Admit: 2023-01-20 | Discharge: 2023-01-20 | Disposition: A | Discharge status: Home / Self Care | Payer: Medicare Other | Source: Ambulatory Visit | Attending: Internal Medicine

## 2023-01-20 DIAGNOSIS — J4550 Severe persistent asthma, uncomplicated: Secondary | ICD-10-CM | POA: Diagnosis not present

## 2023-01-20 NOTE — Progress Notes (Signed)
Daily Session Note  Patient Details  Name: [name] MRN: [id number] Date of Birth: January 02, 1933 Referring Provider:   Doristine Devoid Pulmonary Rehab Walk Test from 12/26/2022 in [facility] for Heart, Vascular, & Lung Health  Referring Provider Ramawamy       Encounter Date: 01/20/2023  Check In:  Session Check In - 01/20/23 1140       Check-In   Supervising physician immediately available to respond to emergencies [facility] MD immediately available    Physician(s) Metta Clines, NP    Location [facility]    Staff Present Raford Pitcher, MS, ACSM-CEP, Exercise Physiologist;Anahla Bevis Erin Sons BS, ACSM-CEP, Exercise Physiologist;Samantha Belarus, RD, Dutch Gray, RN, BSN    Virtual Visit No    Medication changes reported     No    Fall or balance concerns reported    No    Tobacco Cessation No Change    Warm-up and Cool-down Performed as group-led Writer Performed Yes    VAD Patient? No    PAD/SET Patient? No      Pain Assessment   Currently in Pain? No/denies             Capillary Blood Glucose: No results found. However, due to the size of the patient record, not all encounters were searched. Please check Results Review for a complete set of results.    Social History   Tobacco Use  Smoking Status Never  Smokeless Tobacco Never    Goals Met:  Proper associated with RPD/PD & O2 Sat Independence with exercise equipment Exercise tolerated well No report of concerns or symptoms today Strength training completed today  Goals Unmet:  Not Applicable  Comments: Service time is from 1007 to 1147.    Dr. Mechele Collin is Medical Director for Pulmonary Rehab at [facility].

## 2023-01-22 ENCOUNTER — Encounter (HOSPITAL_COMMUNITY)
Admission: RE | Admit: 2023-01-22 | Discharge: 2023-01-22 | Disposition: A | Discharge status: Home / Self Care | Payer: Medicare Other | Source: Ambulatory Visit | Attending: Internal Medicine | Admitting: Internal Medicine

## 2023-01-22 DIAGNOSIS — J4550 Severe persistent asthma, uncomplicated: Secondary | ICD-10-CM

## 2023-01-22 NOTE — Progress Notes (Signed)
Daily Session Note  Patient Details  Name: Deisi Daloia MRN: [id number] Date of Birth: 30-Jan-1933 Referring Provider:   Doristine Devoid Pulmonary Rehab Walk Test from 12/26/2022 in [facility] for Heart, Vascular, & Lung Health  Referring Provider Ramawamy       Encounter Date: 01/22/2023  Check In:  Session Check In - 01/22/23 1137       Check-In   Supervising physician immediately available to respond to emergencies [facility] MD immediately available    Physician(s) Bernadene Person, NP    Location [facility]    Staff Present Raford Pitcher, MS, ACSM-CEP, Exercise Physiologist;Casey Erin Sons BS, ACSM-CEP, Exercise Physiologist;Samantha Belarus, RD, Dutch Gray, RN, BSN    Virtual Visit No    Medication changes reported     No    Fall or balance concerns reported    No    Tobacco Cessation No Change    Warm-up and Cool-down Performed as group-led Writer Performed Yes    VAD Patient? No    PAD/SET Patient? No      Pain Assessment   Currently in Pain? No/denies    Multiple Pain Sites No             Capillary Blood Glucose: No results found. However, due to the size of the patient record, not all encounters were searched. Please check Results Review for a complete set of results.    Social History   Tobacco Use  Smoking Status Never  Smokeless Tobacco Never    Goals Met:  Proper associated with RPD/PD & O2 Sat Exercise tolerated well No report of concerns or symptoms today Strength training completed today  Goals Unmet:  Not Applicable  Comments: Service time is from 1010 to 1143.    Dr. Mechele Collin is Medical Director for Pulmonary Rehab at [facility].

## 2023-01-27 ENCOUNTER — Encounter (HOSPITAL_COMMUNITY)
Admission: RE | Admit: 2023-01-27 | Discharge: 2023-01-27 | Disposition: A | Discharge status: Home / Self Care | Payer: Medicare Other | Source: Ambulatory Visit | Attending: Internal Medicine

## 2023-01-27 VITALS — Wt 138.7 lb

## 2023-01-27 DIAGNOSIS — J4550 Severe persistent asthma, uncomplicated: Secondary | ICD-10-CM

## 2023-01-27 NOTE — Progress Notes (Signed)
Daily Session Note  Patient Details  Name: [name] MRN: [id number] Date of Birth: 07-16-1932 Referring Provider:   Doristine Devoid Pulmonary Rehab Walk Test from 12/26/2022 in [facility] for Heart, Vascular, & Lung Health  Referring Provider Ramawamy       Encounter Date: 01/27/2023  Check In:  Session Check In - 01/27/23 1104       Check-In   Supervising physician immediately available to respond to emergencies [facility] MD immediately available    Physician(s) Jari Favre, NP    [facility]    Staff Present Raford Pitcher, MS, ACSM-CEP, Exercise Physiologist;Caiden Arteaga Erin Sons BS, ACSM-CEP, Exercise Physiologist;Samantha Belarus, RD, Dutch Gray, RN, Marton Redwood, MS, ACSM-CEP, CCRP, Exercise Physiologist    Virtual Visit No    Medication changes reported     No    Fall or balance concerns reported    No    Tobacco Cessation No Change    Warm-up and Cool-down Performed as group-led instruction    Resistance Training Performed Yes    VAD Patient? No    PAD/SET Patient? No      Pain Assessment   Currently in Pain? No/denies    Multiple Pain Sites No             Capillary Blood Glucose: No results found. However, due to the size of the patient record, not all encounters were searched. Please check Results Review for a complete set of results.   Exercise Prescription Changes - 01/27/23 1200       Response to Exercise   Blood Pressure (Admit) 122/72    Blood Pressure (Exercise) 166/68    Blood Pressure (Exit) 146/72    Heart Rate (Admit) 73 bpm    Heart Rate (Exercise) 96 bpm    Heart Rate (Exit) 88 bpm    Oxygen Saturation (Admit) 96 %    Oxygen Saturation (Exercise) 96 %    Oxygen Saturation (Exit) 95 %    Rating of Perceived Exertion (Exercise) 13    Perceived Dyspnea (Exercise) 2    Duration Continue with 30 min of aerobic exercise without signs/symptoms of physical distress.    Intensity THRR  unchanged      Resistance Training   Training Prescription Yes    Weight red bands    Reps 10-15    Time 10 Minutes      NuStep   Level 2    Minutes 15    METs 1.9      Track   Laps 8    Minutes 15    METs 2.23             Social History   Tobacco Use  Smoking Status Never  Smokeless Tobacco Never    Goals Met:  Proper associated with RPD/PD & O2 Sat Independence with exercise equipment Exercise tolerated well No report of concerns or symptoms today Strength training completed today  Goals Unmet:  Not Applicable  Comments: Service time is from 1005 to 1121.    Dr. Mechele Collin is Medical Director for Pulmonary Rehab at [facility].

## 2023-01-29 ENCOUNTER — Encounter (HOSPITAL_COMMUNITY)
Admission: RE | Admit: 2023-01-29 | Discharge: 2023-01-29 | Disposition: A | Discharge status: Home / Self Care | Payer: Medicare Other | Source: Ambulatory Visit | Attending: Internal Medicine | Admitting: Internal Medicine

## 2023-01-29 DIAGNOSIS — J4550 Severe persistent asthma, uncomplicated: Secondary | ICD-10-CM | POA: Diagnosis not present

## 2023-01-29 NOTE — Progress Notes (Signed)
Daily Session Note  Patient Details  Name: [name] MRN: [id number] Date of Birth: May 25, 1932 Referring Provider:   Doristine Devoid Pulmonary Rehab Walk Test from 12/26/2022 in [facility] for [facility], V[facility], & [facility]  Referring Provider Ramawamy       Encounter Date: 01/29/2023  Check In:  Session Check In - 01/29/23 1128       Check-In   Supervising physician immediately available to respond to emergencies [facility] MD immediately available    Physician(s) Jari Favre, NP    Location [facility]    Staff Present Raford Pitcher, MS, ACSM-CEP, Exercise Physiologist;Payden Bonus Erin Sons BS, ACSM-CEP, Exercise Physiologist;Mary Gerre Scull, RN, BSN    Virtual Visit No    Medication changes reported     No    Fall or balance concerns reported    No    Tobacco Cessation No Change    Warm-up and Cool-down Performed as group-led instruction    Resistance Training Performed Yes    VAD Patient? No    PAD/SET Patient? No      Pain Assessment   Currently in Pain? No/denies    Multiple Pain Sites No             Capillary Blood Glucose: No results found. However, due to the size of the patient record, not all encounters were searched. Please check Results Review for a complete set of results.    Social History   Tobacco Use  Smoking Status Never  Smokeless Tobacco Never    Goals Met:  Proper associated with RPD/PD & O2 Sat Independence with exercise equipment Exercise tolerated well No report of concerns or symptoms today Strength training completed today  Goals Unmet:  Not Applicable  Comments: Service time is from 1012 to 1159.    Dr. Mechele Collin is Medical Director for Pulmonary Rehab at [facility].

## 2023-02-03 ENCOUNTER — Encounter (HOSPITAL_COMMUNITY)
Admission: RE | Admit: 2023-02-03 | Discharge: 2023-02-03 | Disposition: A | Discharge status: Home / Self Care | Payer: Medicare Other | Source: Ambulatory Visit | Attending: Internal Medicine

## 2023-02-03 DIAGNOSIS — J4550 Severe persistent asthma, uncomplicated: Secondary | ICD-10-CM

## 2023-02-03 NOTE — Progress Notes (Signed)
Daily Session Note  Patient Details  Name: [name] MRN: [id number] Date of Birth: 09-Sep-1932 Referring Provider:   Doristine Devoid Pulmonary Rehab Walk Test from 12/26/2022 in [facility] for Heart, Vascular, & Lung Health  Referring Provider Ramawamy       Encounter Date: 02/03/2023  Check In:  Session Check In - 02/03/23 1157       Check-In   Supervising physician immediately available to respond to emergencies [facility] MD immediately available    Physician(s) Carlos Levering, NP    [facility]    Staff Present Raford Pitcher, MS, ACSM-CEP, Exercise Physiologist;Casey Erin Sons BS, ACSM-CEP, Exercise Physiologist;Mary Gerre Scull, RN, BSN    Virtual Visit No    Medication changes reported     No    Fall or balance concerns reported    No    Tobacco Cessation No Change    Warm-up and Cool-down Performed as group-led instruction    Resistance Training Performed Yes    VAD Patient? No    PAD/SET Patient? No      Pain Assessment   Currently in Pain? No/denies    Multiple Pain Sites No             Capillary Blood Glucose: No results found. However, due to the size of the patient record, not all encounters were searched. Please check Results Review for a complete set of results.    Social History   Tobacco Use  Smoking Status Never  Smokeless Tobacco Never    Goals Met:  Proper associated with RPD/PD & O2 Sat Independence with exercise equipment Exercise tolerated well No report of concerns or symptoms today Strength training completed today  Goals Unmet:  Not Applicable  Comments: Service time is from 1010 to 1137.    Dr. Mechele Collin is Medical Director for Pulmonary Rehab at [facility].

## 2023-02-05 ENCOUNTER — Encounter (HOSPITAL_COMMUNITY)
Admission: RE | Admit: 2023-02-05 | Discharge: 2023-02-05 | Disposition: A | Discharge status: Home / Self Care | Payer: Medicare Other | Source: Ambulatory Visit | Attending: Internal Medicine | Admitting: Internal Medicine

## 2023-02-05 DIAGNOSIS — J4550 Severe persistent asthma, uncomplicated: Secondary | ICD-10-CM

## 2023-02-05 NOTE — Progress Notes (Signed)
Daily Session Note  Patient Details  Name: [name] MRN: [id number] Date of Birth: [birth year] Referring Provider:   Doristine Devoid Pulmonary Rehab Walk Test from 12/26/2022 in [facility] for Heart, Vascular, & Lung Health  Referring Provider Ramawamy       Encounter Date: 02/05/2023  Check In:  Session Check In - 02/05/23 1037       Check-In   Supervising physician immediately available to respond to emergencies [facility] MD immediately available    Physician(s) Edd Fabian, NP    [facility]    Staff Present Raford Pitcher, MS, ACSM-CEP, Exercise Physiologist;Casey Erin Sons BS, ACSM-CEP, Exercise Physiologist;Mary Gerre Scull, RN, BSN;Samantha Belarus, RD, LDN    Virtual Visit No    Medication changes reported     No    Fall or balance concerns reported    No    Tobacco Cessation No Change    Warm-up and Cool-down Performed as group-led instruction    Resistance Training Performed Yes    VAD Patient? No    PAD/SET Patient? No      Pain Assessment   Currently in Pain? No/denies    Multiple Pain Sites No             Capillary Blood Glucose: No results found. However, due to the size of the patient record, not all encounters were searched. Please check Results Review for a complete set of results.    Social History   Tobacco Use  Smoking Status Never  Smokeless Tobacco Never    Goals Met:  Proper associated with RPD/PD & O2 Sat Independence with exercise equipment Exercise tolerated well No report of concerns or symptoms today Strength training completed today  Goals Unmet:  Not Applicable  Comments: Service time is from 1013 to 1155.    Dr. Mechele Collin is Medical Director for Pulmonary Rehab at [facility].

## 2023-02-10 ENCOUNTER — Encounter (HOSPITAL_COMMUNITY)
Admission: RE | Admit: 2023-02-10 | Discharge: 2023-02-10 | Disposition: A | Discharge status: Home / Self Care | Payer: Medicare Other | Source: Ambulatory Visit | Attending: Pulmonary Disease | Admitting: Pulmonary Disease

## 2023-02-10 VITALS — Wt 139.3 lb

## 2023-02-10 DIAGNOSIS — J4550 Severe persistent asthma, uncomplicated: Secondary | ICD-10-CM | POA: Insufficient documentation

## 2023-02-10 DIAGNOSIS — Z79899 Other long term (current) drug therapy: Secondary | ICD-10-CM | POA: Insufficient documentation

## 2023-02-10 NOTE — Progress Notes (Signed)
Daily Session Note  Patient Details  Name: [name] MRN: [id number] Date of Birth: 21-Dec-1932 Referring Provider:   Doristine Devoid Pulmonary Rehab Walk Test from 12/26/2022 in [facility] for Heart, Vascular, & Lung Health  Referring Provider Ramawamy       Encounter Date: 02/10/2023  Check In:  Session Check In - 02/10/23 1125       Check-In   Supervising physician immediately available to respond to emergencies [facility] MD immediately available    Physician(s) Robin Searing, NP    Location [facility]    Staff Present Raford Pitcher, MS, ACSM-CEP, Exercise Physiologist;Alyene Predmore Erin Sons BS, ACSM-CEP, Exercise Physiologist;Mary Gerre Scull, RN, BSN;Samantha Belarus, RD, LDN    Virtual Visit No    Medication changes reported     No    Fall or balance concerns reported    No    Tobacco Cessation No Change    Warm-up and Cool-down Performed as group-led instruction    Resistance Training Performed Yes    VAD Patient? No    PAD/SET Patient? No      Pain Assessment   Currently in Pain? No/denies    Multiple Pain Sites No             Capillary Blood Glucose: No results found. However, due to the size of the patient record, not all encounters were searched. Please check Results Review for a complete set of results.   Exercise Prescription Changes - 02/10/23 1100       Response to Exercise   Blood Pressure (Admit) 120/60    Blood Pressure (Exercise) 156/68    Blood Pressure (Exit) 120/60    Heart Rate (Admit) 76 bpm    Heart Rate (Exercise) 95 bpm    Heart Rate (Exit) 86 bpm    Oxygen Saturation (Admit) 98 %    Oxygen Saturation (Exercise) 98 %    Oxygen Saturation (Exit) 95 %    Rating of Perceived Exertion (Exercise) 12    Perceived Dyspnea (Exercise) 3    Duration Continue with 30 min of aerobic exercise without signs/symptoms of physical distress.    Intensity THRR unchanged      Resistance Training   Training  Prescription Yes    Weight red bands    Reps 10-15    Time 10 Minutes      NuStep   Level 3    SPM 86    Minutes 15    METs 3.1      Track   Laps 10    Minutes 15    METs 2.54             Social History   Tobacco Use  Smoking Status Never  Smokeless Tobacco Never    Goals Met:  Proper associated with RPD/PD & O2 Sat Independence with exercise equipment Exercise tolerated well No report of concerns or symptoms today Strength training completed today  Goals Unmet:  Not Applicable  Comments: Service time is from 1015 to 1145.    Dr. Mechele Collin is Medical Director for Pulmonary Rehab at [name].

## 2023-02-11 NOTE — Progress Notes (Signed)
Pulmonary Individual Treatment Plan  Patient Details  Name: [name] MRN: [id number] Date of Birth: [birth year] Referring Provider:   Doristine Devoid Pulmonary Rehab Walk Test from 12/26/2022 in [facility]  Referring Provider Ramawamy       Initial Encounter Date:  Flowsheet Row Pulmonary Rehab Walk Test from 12/26/2022 in [facility]  Date 12/26/22       Visit Diagnosis: Severe persistent asthma without complication  Patient's Home Medications on Admission:   Current Outpatient Medications:    Abaloparatide (TYMLOS) 3120 MCG/1.56ML SOPN, Inject Subcutaneous each morning, Disp: , Rfl:    albuterol (PROVENTIL) (2.5 MG/3ML) 0.083% nebulizer solution, Take 3 mLs (2.5 mg total) by nebulization every 6 (six) hours as needed for wheezing or shortness of breath., Disp: 75 mL, Rfl: 12   albuterol (VENTOLIN HFA) 108 (90 Base) MCG/ACT inhaler, TAKE 2 PUFFS BY MOUTH EVERY 6 HOURS AS NEEDED FOR WHEEZE OR SHORTNESS OF BREATH, Disp: 8.5 each, Rfl: 5   budesonide-formoterol (SYMBICORT) 160-4.5 MCG/ACT inhaler, Inhale 2 puffs into the lungs in the morning and at bedtime., Disp: 10.2 g, Rfl: 5   Calcium Carbonate-Vitamin D 600-400 MG-UNIT tablet, Take 1 tablet by mouth daily., Disp: , Rfl:    chlorpheniramine (CHLOR-TRIMETON) 4 MG tablet, Take 4-8 mg by mouth See admin instructions. 4 mg in the morning and 8 mg at bedtime, Disp: , Rfl:    Cholecalciferol (VITAMIN D3) 1000 UNITS tablet, Take 1,000 Units by mouth every evening. , Disp: , Rfl:    dextromethorphan (DELSYM) 30 MG/5ML liquid, Take 60 mg by mouth 2 (two) times daily as needed for cough., Disp: , Rfl:    docusate sodium (COLACE) 100 MG capsule, Take 1 capsule (100 mg total) by mouth 2 (two) times daily., Disp: 10 capsule, Rfl: 0   dupilumab (DUPIXENT) 200 MG/1. prefilled syringe, Inject 200 mg into the skin every  14 (fourteen) days., Disp: 6.84 mL, Rfl: 2   fexofenadine (ALLEGRA) 180 MG tablet, Take 180 mg by mouth daily.  , Disp: , Rfl:    furosemide (LASIX) 20 MG tablet, TAKE 1 TABLET (20 MG TOTAL) BY MOUTH DAILY AS NEEDED FOR FLUID OR EDEMA., Disp: 90 tablet, Rfl: 2   ipratropium (ATROVENT) 0.06 % nasal spray, Place 2 sprays into both nostrils 2 (two) times daily., Disp: 15 mL, Rfl: 6   mirtazapine (REMERON) 15 MG tablet, Take 15 mg by mouth at bedtime., Disp: , Rfl:    mometasone (NASONEX) 50 MCG/ACT nasal spray, USE 2 SPRAYS NASALLY 2 TIMES DAILY, Disp: 17 g, Rfl: 11   montelukast (SINGULAIR) 10 MG tablet, TAKE 1 TABLET BY MOUTH EVERYDAY AT BEDTIME (Patient not taking: Reported on 12/26/2022), Disp: 90 tablet, Rfl: 2   Multiple Vitamin (MULTIVITAMIN WITH MINERALS) TABS tablet, Take 1 tablet by mouth daily. Centrum Silver Multivitamin, Disp: , Rfl:    multivitamin-lutein (OCUVITE-LUTEIN) CAPS capsule, Take 1 capsule by mouth daily., Disp: , Rfl:    mupirocin ointment (BACTROBAN) 2 %, as needed. , Disp: , Rfl:    nitroGLYCERIN (NITROSTAT) 0.4 MG SL tablet, Place 1 tablet (0.4 mg total) under the tongue every 5 (five) minutes as needed for chest pain. (Patient not taking: Reported on 12/26/2022), Disp: 25 tablet, Rfl: 3   pantoprazole (PROTONIX) 40 MG tablet, Take 40 mg by mouth daily., Disp: , Rfl:    potassium chloride (KLOR-CON) 10 MEQ tablet, Take 1 tablet (10 mEq  total) by mouth daily., Disp: 90 tablet, Rfl: 3   pramipexole (MIRAPEX) 0.125 MG tablet, Take 0.375 mg by mouth at bedtime., Disp: , Rfl:    rivaroxaban (XARELTO) 10 MG TABS tablet, Take 10 mg by mouth daily., Disp: , Rfl:    rosuvastatin (CRESTOR) 5 MG tablet, TAKE 1 TABLET BY MOUTH EVERYDAY AT BEDTIME, Disp: 90 tablet, Rfl: 3   Spacer/Aero-Holding Chambers (AEROCHAMBER MV) inhaler, by Other route. Use as instructed , Disp: , Rfl:    telmisartan (MICARDIS) 20 MG tablet, Take 20 mg by mouth daily. , Disp: , Rfl:    vitamin B-12  (CYANOCOBALAMIN) 500 MCG tablet, Take 500 mcg by mouth daily.  , Disp: , Rfl:   Current Facility-Administered Medications:    albuterol (PROVENTIL) (2.5 MG/3ML) 0.083% nebulizer solution 2.5 mg, 2.5 mg, Nebulization, Q6H PRN, Bevelyn Ngo, NP, 2.5 mg at 10/08/21 0918   albuterol (VENTOLIN HFA) 108 (90 Base) MCG/ACT inhaler 2 puff, 2 puff, Inhalation, Once PRN, Causey, Larna Daughters, NP   diphenhydrAMINE (BENADRYL) injection 50 mg, 50 mg, Intramuscular, Once PRN, Causey, Larna Daughters, NP   EPINEPHrine (EPI-PEN) injection 0.3 mg, 0.3 mg, Intramuscular, Once PRN, Causey, Larna Daughters, NP  Past Medical History: Past Medical History:  Diagnosis Date   Anemia    after hysterectomy   Angio-edema    Asthma, severe persistent    pulmologist-- Ramaswamy   Cancer [facility])    Breast cancer on right   Chronic kidney disease, stage II (mild)    DDD (degenerative disc disease), cervical    Diverticulitis    Diverticulosis yrs ago   hx of    Edema, lower extremity    occ both legs swell   GERD (gastroesophageal reflux disease)    Headache    sinus headaches   Heart murmur    no problems - per pt   History of breast cancer 1990 left mastectomy also   1989  S/P   RIGHT MASTECTOMY ;  NO CHEMORADIATION //   NO RECURRENCE   History of DVT of lower extremity 5 yrs ago   right leg   History of shingles    Hyperlipidemia    Internal hemorrhoid    Leg ulcer, left (HCC)    Osteoporosis, unspecified    Knee and hip osteoarthritis bilaterally   Perennial allergic rhinitis    Peripheral vascular disease (HCC)    Pneumonia    Restless legs syndrome (RLS)    Rheumatoid arthritis (HCC)    hands   Thyroid nodule    followed by dr perrini yearly, no current problam   Unspecified essential hypertension    Urticaria     Tobacco Use: Social History   Tobacco Use  Smoking Status Never  Smokeless Tobacco Never    Labs: Review Flowsheet       Latest Ref Rng & Units 11/10/2011  03/03/2013 02/23/2014 03/19/2017  Labs for ITP Cardiac and Pulmonary Rehab  Cholestrol 100 - 199 mg/dL 161  096  045  409   LDL (calc) 0 - 99 mg/dL 49  62  79  61   HDL-C >39 mg/dL 81.19  147.82  95.62  130   Trlycerides 0 - 149 mg/dL 86.5  78.4  69.6  57     Details            Capillary Blood Glucose: No results found for: "GLUCAP"   Pulmonary Assessment Scores:  Pulmonary Assessment Scores     Row Name 12/26/22 [phone]  ADL UCSD   ADL Phase Entry     SOB Score total 46       CAT Score   CAT Score 19       mMRC Score   mMRC Score 3             UCSD: Self-administered rating of dyspnea associated with activities of daily living (ADLs) 6-point scale (0 = "not at all" to 5 = "maximal or unable to do because of breathlessness")  Scoring Scores range from 0 to 120.  Minimally important difference is 5 units  CAT: CAT can identify the health impairment of COPD patients and is better correlated with disease progression.  CAT has a scoring range of zero to 40. The CAT score is classified into four groups of low (less than 10), medium (10 - 20), high (21-30) and very high (31-40) based on the impact level of disease on health status. A CAT score over 10 suggests significant symptoms.  A worsening CAT score could be explained by an exacerbation, poor medication adherence, poor inhaler technique, or progression of COPD or comorbid conditions.  CAT MCID is 2 points  mMRC: mMRC (Modified Medical Research Council) Dyspnea Scale is used to assess the degree of baseline functional disability in patients of respiratory disease due to dyspnea. No minimal important difference is established. A decrease in score of 1 point or greater is considered a positive change.   Pulmonary Function Assessment:  Pulmonary Function Assessment - 12/26/22 1051       Breath   Bilateral Breath Sounds Clear    Shortness of Breath Yes;Limiting activity             Exercise Target  Goals: Exercise Program Goal: Individual exercise prescription set using results from initial 6 min walk test and THRR while considering  patient's activity barriers and safety.   Exercise Prescription Goal: Initial exercise prescription builds to 30-45 minutes a day of aerobic activity, 2-3 days per week.  Home exercise guidelines will be given to patient during program as part of exercise prescription that the participant will acknowledge.  Activity Barriers & Risk Stratification:  Activity Barriers & Cardiac Risk Stratification - 12/26/22 0924       Activity Barriers & Cardiac Risk Stratification   Activity Barriers Arthritis;Back Problems;Deconditioning;Muscular Weakness;Shortness of Breath;Right Hip Replacement;Joint Problems    Comments waiting for right shoulder replacement             6 Minute Walk:  6 Minute Walk     Row Name 12/26/22 1042         6 Minute Walk   Phase Initial     Distance 1022 feet     Walk Time 6 minutes     # of Rest Breaks 2  3:10-3:37, 5:37-6:00     MPH 1.97     METS 1.52     RPE 13     Perceived Dyspnea  2     VO2 Peak 5.34     Symptoms No     Resting HR 80 bpm     Resting BP 140/74     Resting Oxygen Saturation  100 %     Exercise Oxygen Saturation  during 6 min walk 95 %     Max Ex. HR 89 bpm     Max Ex. BP 150/80     2 Minute Post BP 148/78       Interval HR   1 Minute HR 82  2 Minute HR 86     3 Minute HR 86     4 Minute HR 85     5 Minute HR 89     6 Minute HR 89     2 Minute Post HR 85     Interval Heart Rate? Yes       Interval Oxygen   Interval Oxygen? Yes     Baseline Oxygen Saturation % 100 %     1 Minute Oxygen Saturation % 98 %     1 Minute Liters of Oxygen 0 L     2 Minute Oxygen Saturation % 95 %     2 Minute Liters of Oxygen 0 L     3 Minute Oxygen Saturation % 97 %     3 Minute Liters of Oxygen 0 L     4 Minute Oxygen Saturation % 99 %     4 Minute Liters of Oxygen 0 L     5 Minute Oxygen Saturation  % 100 %     5 Minute Liters of Oxygen 0 L     6 Minute Oxygen Saturation % 96 %     6 Minute Liters of Oxygen 0 L     2 Minute Post Oxygen Saturation % 97 %     2 Minute Post Liters of Oxygen 0 L              Oxygen Initial Assessment:  Oxygen Initial Assessment - 12/26/22 0927       Home Oxygen   Home Oxygen Device None    Sleep Oxygen Prescription None    Home Exercise Oxygen Prescription None    Home Resting Oxygen Prescription None      Initial 6 min Walk   Oxygen Used None      Program Oxygen Prescription   Program Oxygen Prescription None      Intervention   Short Term Goals To learn and understand importance of maintaining oxygen saturations>88%;To learn and demonstrate proper use of respiratory medications;To learn and understand importance of monitoring SPO2 with pulse oximeter and demonstrate accurate use of the pulse oximeter.;To learn and demonstrate proper pursed lip breathing techniques or other breathing techniques.     Long  Term Goals Verbalizes importance of monitoring SPO2 with pulse oximeter and return demonstration;Maintenance of O2 saturations>88%;Exhibits proper breathing techniques, such as pursed lip breathing or other method taught during program session;Compliance with respiratory medication;Demonstrates proper use of MDI's             Oxygen Re-Evaluation:  Oxygen Re-Evaluation     Row Name 01/07/23 0950 02/04/23 0805           Program Oxygen Prescription   Program Oxygen Prescription None None        Home Oxygen   Home Oxygen Device None None      Sleep Oxygen Prescription None None      Home Exercise Oxygen Prescription None None      Home Resting Oxygen Prescription None None        Goals/Expected Outcomes   Short Term Goals To learn and understand importance of maintaining oxygen saturations>88%;To learn and demonstrate proper use of respiratory medications;To learn and understand importance of monitoring SPO2 with pulse  oximeter and demonstrate accurate use of the pulse oximeter.;To learn and demonstrate proper pursed lip breathing techniques or other breathing techniques.  To learn and understand importance of maintaining oxygen saturations>88%;To learn and demonstrate proper use of respiratory medications;To learn and  understand importance of monitoring SPO2 with pulse oximeter and demonstrate accurate use of the pulse oximeter.;To learn and demonstrate proper pursed lip breathing techniques or other breathing techniques.       Long  Term Goals Verbalizes importance of monitoring SPO2 with pulse oximeter and return demonstration;Maintenance of O2 saturations>88%;Exhibits proper breathing techniques, such as pursed lip breathing or other method taught during program session;Compliance with respiratory medication;Demonstrates proper use of MDI's Verbalizes importance of monitoring SPO2 with pulse oximeter and return demonstration;Maintenance of O2 saturations>88%;Exhibits proper breathing techniques, such as pursed lip breathing or other method taught during program session;Compliance with respiratory medication;Demonstrates proper use of MDI's      Goals/Expected Outcomes Compliance and understanding of oxygen saturation monitoring and breathing techniques to decrease shortness of breath. Compliance and understanding of oxygen saturation monitoring and breathing techniques to decrease shortness of breath.               Oxygen Discharge (Final Oxygen Re-Evaluation):  Oxygen Re-Evaluation - 02/04/23 0805       Program Oxygen Prescription   Program Oxygen Prescription None      Home Oxygen   Home Oxygen Device None    Sleep Oxygen Prescription None    Home Exercise Oxygen Prescription None    Home Resting Oxygen Prescription None      Goals/Expected Outcomes   Short Term Goals To learn and understand importance of maintaining oxygen saturations>88%;To learn and demonstrate proper use of respiratory  medications;To learn and understand importance of monitoring SPO2 with pulse oximeter and demonstrate accurate use of the pulse oximeter.;To learn and demonstrate proper pursed lip breathing techniques or other breathing techniques.     Long  Term Goals Verbalizes importance of monitoring SPO2 with pulse oximeter and return demonstration;Maintenance of O2 saturations>88%;Exhibits proper breathing techniques, such as pursed lip breathing or other method taught during program session;Compliance with respiratory medication;Demonstrates proper use of MDI's    Goals/Expected Outcomes Compliance and understanding of oxygen saturation monitoring and breathing techniques to decrease shortness of breath.             Initial Exercise Prescription:  Initial Exercise Prescription - 12/26/22 1000       Date of Initial Exercise RX and Referring Provider   Date 12/26/22    Referring Provider Ramawamy    Expected Discharge Date 03/26/23      NuStep   Level 1    SPM 50    Minutes 30    METs 1.5      Prescription Details   Frequency (times per week) 2    Duration Progress to 30 minutes of continuous aerobic without signs/symptoms of physical distress      Intensity   THRR 40-80% of Max Heartrate 53-106    Ratings of Perceived Exertion 11-13    Perceived Dyspnea 0-4      Progression   Progression Continue to progress workloads to maintain intensity without signs/symptoms of physical distress.      Resistance Training   Training Prescription Yes    Weight red bands    Reps 10-15             Perform Capillary Blood Glucose checks as needed.  Exercise Prescription Changes:   Exercise Prescription Changes     Row Name 01/13/23 1200 01/27/23 1200 02/10/23 1100         Response to Exercise   Blood Pressure (Admit) 138/66 122/72 120/60     Blood Pressure (Exercise) 150/68 166/68 156/68  Blood Pressure (Exit) 140/62 146/72 120/60     Heart Rate (Admit) 87 bpm 73 bpm 76 bpm      Heart Rate (Exercise) 97 bpm 96 bpm 95 bpm     Heart Rate (Exit) 89 bpm 88 bpm 86 bpm     Oxygen Saturation (Admit) 95 % 96 % 98 %     Oxygen Saturation (Exercise) 95 % 96 % 98 %     Oxygen Saturation (Exit) 96 % 95 % 95 %     Rating of Perceived Exertion (Exercise) 13 13 12      Perceived Dyspnea (Exercise) 2 2 3      Duration Progress to 30 minutes of  aerobic without signs/symptoms of physical distress Continue with 30 min of aerobic exercise without signs/symptoms of physical distress. Continue with 30 min of aerobic exercise without signs/symptoms of physical distress.     Intensity THRR unchanged THRR unchanged THRR unchanged       Resistance Training   Training Prescription Yes Yes Yes     Weight red bands red bands red bands     Reps 10-15 10-15 10-15     Time 10 Minutes 10 Minutes 10 Minutes       NuStep   Level 1 2 3      SPM -- -- 86     Minutes 15 15 15      METs 2.6 1.9 3.1       Track   Laps 8 8 10      Minutes 15 15 15      METs 2.23 2.23 2.54              Exercise Comments:   Exercise Comments     Row Name 01/01/23 0948           Exercise Comments Pt completed first day of exercise. Tanis exercised for 30 min on the Nustep. She averaged 2.2 METs at level 1 on the Nustep. Arion performed the warmup and cooldown standing holding onto a chair for balance with verbal cues. Discussed METs and how to increase METs.                Exercise Goals and Review:   Exercise Goals     Row Name 12/26/22 1038 01/07/23 0946           Exercise Goals   Increase Physical Activity Yes Yes      Intervention Provide advice, education, support and counseling about physical activity/exercise needs.;Develop an individualized exercise prescription for aerobic and resistive training based on initial evaluation findings, risk stratification, comorbidities and participant's personal goals. Provide advice, education, support and counseling about physical activity/exercise  needs.;Develop an individualized exercise prescription for aerobic and resistive training based on initial evaluation findings, risk stratification, comorbidities and participant's personal goals.      Expected Outcomes Short Term: Attend rehab on a regular basis to increase amount of physical activity.;Long Term: Exercising regularly at least 3-5 days a week.;Long Term: Add in home exercise to make exercise part of routine and to increase amount of physical activity. Short Term: Attend rehab on a regular basis to increase amount of physical activity.;Long Term: Exercising regularly at least 3-5 days a week.;Long Term: Add in home exercise to make exercise part of routine and to increase amount of physical activity.      Increase Strength and Stamina Yes Yes      Intervention Provide advice, education, support and counseling about physical activity/exercise needs.;Develop an individualized exercise prescription for aerobic and  resistive training based on initial evaluation findings, risk stratification, comorbidities and participant's personal goals. Provide advice, education, support and counseling about physical activity/exercise needs.;Develop an individualized exercise prescription for aerobic and resistive training based on initial evaluation findings, risk stratification, comorbidities and participant's personal goals.      Expected Outcomes Short Term: Increase workloads from initial exercise prescription for resistance, speed, and METs.;Short Term: Perform resistance training exercises routinely during rehab and add in resistance training at home;Long Term: Improve cardiorespiratory fitness, muscular endurance and strength as measured by increased METs and functional capacity ( ) Short Term: Increase workloads from initial exercise prescription for resistance, speed, and METs.;Short Term: Perform resistance training exercises routinely during rehab and add in resistance training at home;Long Term:  Improve cardiorespiratory fitness, muscular endurance and strength as measured by increased METs and functional capacity ( )      Able to understand and use rate of perceived exertion (RPE) scale Yes Yes      Intervention Provide education and explanation on how to use RPE scale Provide education and explanation on how to use RPE scale      Expected Outcomes Short Term: Able to use RPE daily in rehab to express subjective intensity level;Long Term:  Able to use RPE to guide intensity level when exercising independently Short Term: Able to use RPE daily in rehab to express subjective intensity level;Long Term:  Able to use RPE to guide intensity level when exercising independently      Able to understand and use Dyspnea scale Yes Yes      Intervention Provide education and explanation on how to use Dyspnea scale Provide education and explanation on how to use Dyspnea scale      Expected Outcomes Short Term: Able to use Dyspnea scale daily in rehab to express subjective sense of shortness of breath during exertion;Long Term: Able to use Dyspnea scale to guide intensity level when exercising independently Short Term: Able to use Dyspnea scale daily in rehab to express subjective sense of shortness of breath during exertion;Long Term: Able to use Dyspnea scale to guide intensity level when exercising independently      Knowledge and understanding of Target Heart Rate Range (THRR) Yes Yes      Intervention Provide education and explanation of THRR including how the numbers were predicted and where they are located for reference Provide education and explanation of THRR including how the numbers were predicted and where they are located for reference      Expected Outcomes Short Term: Able to state/look up THRR;Long Term: Able to use THRR to govern intensity when exercising independently;Short Term: Able to use daily as guideline for intensity in rehab Short Term: Able to state/look up THRR;Long Term: Able to  use THRR to govern intensity when exercising independently;Short Term: Able to use daily as guideline for intensity in rehab      Understanding of Exercise Prescription Yes Yes      Intervention Provide education, explanation, and written materials on patient's individual exercise prescription Provide education, explanation, and written materials on patient's individual exercise prescription      Expected Outcomes Short Term: Able to explain program exercise prescription;Long Term: Able to explain home exercise prescription to exercise independently Short Term: Able to explain program exercise prescription;Long Term: Able to explain home exercise prescription to exercise independently               Exercise Goals Re-Evaluation :  Exercise Goals Re-Evaluation     Row Name 01/07/23 [id number] 02/04/23  1914           Exercise Goal Re-Evaluation   Exercise Goals Review Increase Physical Activity;Able to understand and use Dyspnea scale;Understanding of Exercise Prescription;Increase Strength and Stamina;Knowledge and understanding of Target Heart Rate Range (THRR);Able to understand and use rate of perceived exertion (RPE) scale Increase Physical Activity;Able to understand and use Dyspnea scale;Understanding of Exercise Prescription;Increase Strength and Stamina;Knowledge and understanding of Target Heart Rate Range (THRR);Able to understand and use rate of perceived exertion (RPE) scale      Comments Satomi has completed 2 exercise sessions. She exercises for 30 min on the Nustep. Lisseth averages 2.2 METs at level 1 on the Nustep. She performs the warmup and cooldown standing holding onto a chair with verbal cues. I am considering progressing her to 2x15 min exercise modes. Will discuss with pt next exercise session. It is too soon to note any discernable progressions. Will continue to monitor and progress as able. Julysa has completed 10 exercise sessions. She exercises for 15 min on the Nustep and  track. She averages 1.9 METs at level 2 on the Nustep and 2.23 METs on the track. Dan performs the warmup and cooldown standing holding onto a chair for balance. She has progressed to walking the track for 15 min. This helps with her chronic hip pain. She tolerates the track well and takes rest breaks as needed. Will continue to monitor and progress as able.      Expected Outcomes Through exercise at rehab and home, the patient will decrease shortness of breath with daily activities and feel confident in carrying out an exercise regimen at home. Through exercise at rehab and home, the patient will decrease shortness of breath with daily activities and feel confident in carrying out an exercise regimen at home.               Discharge Exercise Prescription (Final Exercise Prescription Changes):  Exercise Prescription Changes - 02/10/23 1100       Response to Exercise   Blood Pressure (Admit) 120/60    Blood Pressure (Exercise) 156/68    Blood Pressure (Exit) 120/60    Heart Rate (Admit) 76 bpm    Heart Rate (Exercise) 95 bpm    Heart Rate (Exit) 86 bpm    Oxygen Saturation (Admit) 98 %    Oxygen Saturation (Exercise) 98 %    Oxygen Saturation (Exit) 95 %    Rating of Perceived Exertion (Exercise) 12    Perceived Dyspnea (Exercise) 3    Duration Continue with 30 min of aerobic exercise without signs/symptoms of physical distress.    Intensity THRR unchanged      Resistance Training   Training Prescription Yes    Weight red bands    Reps 10-15    Time 10 Minutes      NuStep   Level 3    SPM 86    Minutes 15    METs 3.1      Track   Laps 10    Minutes 15    METs 2.54             Nutrition:  Target Goals: Understanding of nutrition guidelines, daily intake of sodium 1500mg , cholesterol 200mg , calories 30% from fat and 7% or less from saturated fats, daily to have 5 or more servings of fruits and vegetables.  Biometrics:    Nutrition Therapy Plan and  Nutrition Goals:  Nutrition Therapy & Goals - 02/03/23 1514       Nutrition Therapy  Diet General Healthy Diet    Drug/Food Interactions Statins/Certain Fruits      Personal Nutrition Goals   Nutrition Goal Patient to continue diet quality by using the plate method as a guide for meal planning to include lean protein/plant protein, fruits, vegetables, whole grains, nonfat dairy as part of a well-balanced diet.   goal in progress.   Comments Goal in progess. Chelseaann reports eating wide variety of foods focused on heart healthy cooking methods, fruit/vegetable intake, and lean protein sources. She does eat out on occasion. She is down 5.7# since orientation weight; however, she has maintained her weight over the last 4 weeks. Her husband remains a good support. Patient will benefit from participation in pulmonary rehab for nutrition, exercise, and lifestyle modification.      Intervention Plan   Intervention Prescribe, educate and counsel regarding individualized specific dietary modifications aiming towards targeted core components such as weight, hypertension, lipid management, diabetes, heart failure and other comorbidities.;Nutrition handout(s) given to patient.    Expected Outcomes Short Term Goal: Understand basic principles of dietary content, such as calories, fat, sodium, cholesterol and nutrients.;Long Term Goal: Adherence to prescribed nutrition plan.             Nutrition Assessments:  Nutrition Assessments - 01/01/23 1500       Rate Your Plate Scores   Pre Score 50            MEDIFICTS Score Key: >=70 Need to make dietary changes  40-70 Heart Healthy Diet <= 40 Therapeutic Level Cholesterol Diet  Flowsheet Row PULMONARY REHAB OTHER RESPIRATORY from 01/01/2023 in [facility]  Picture Your Plate Total Score on Admission 50      Picture Your Plate Scores: <10 Unhealthy dietary pattern with much room for  improvement. 41-50 Dietary pattern unlikely to meet recommendations for good health and room for improvement. 51-60 More healthful dietary pattern, with some room for improvement.  >60 Healthy dietary pattern, although there may be some specific behaviors that could be improved.    Nutrition Goals Re-Evaluation:  Nutrition Goals Re-Evaluation     Row Name 01/01/23 1340 02/03/23 1514           Goals   Current Weight 138 lb 14.2 oz (63 kg) 139 lb 1.8 oz (63.1 kg)      Comment Cr 1.02, GFR 54 no new labs; most recent labs Cr 1.02, GFR 54      Expected Outcome Goal in progess. Jalitza reports eating wide variety of foods focused on heart healthy cooking methods, fruit/vegetable intake, and lean protein sources. She does eat out on occasion. Her husband remains a good support. Patient will benefit from participation in pulmonary rehab for nutrition, exercise, and lifestyle modification. Goal in progess. Naydean reports eating wide variety of foods focused on heart healthy cooking methods, fruit/vegetable intake, and lean protein sources. She does eat out on occasion. She is down 5.7# since orientation weight; however, she has maintained her weight over the last 4 weeks. Her husband remains a good support. Patient will benefit from participation in pulmonary rehab for nutrition, exercise, and lifestyle modification.               Nutrition Goals Discharge (Final Nutrition Goals Re-Evaluation):  Nutrition Goals Re-Evaluation - 02/03/23 1514       Goals   Current Weight 139 lb 1.8 oz (63.1 kg)    Comment no new labs; most recent labs Cr 1.02, GFR  54    Expected Outcome Goal in progess. Kathlene reports eating wide variety of foods focused on heart healthy cooking methods, fruit/vegetable intake, and lean protein sources. She does eat out on occasion. She is down 5.7# since orientation weight; however, she has maintained her weight over the last 4 weeks. Her husband remains a good support.  Patient will benefit from participation in pulmonary rehab for nutrition, exercise, and lifestyle modification.             Psychosocial: Target Goals: Acknowledge presence or absence of significant depression and/or stress, maximize coping skills, provide positive support system. Participant is able to verbalize types and ability to use techniques and skills needed for reducing stress and depression.  Initial Review & Psychosocial Screening:  Initial Psych Review & Screening - 12/26/22 0925       Initial Review   Current issues with None Identified      Family Dynamics   Good Support System? 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 She denies any psychosocial barriers to rehab      Barriers   Psychosocial barriers to participate in program There are no identifiable barriers or psychosocial needs.             Quality of Life Scores:  Scores of 19 and below usually indicate a poorer quality of life in these areas.  A difference of  2-3 points is a clinically meaningful difference.  A difference of 2-3 points in the total score of the Quality of Life Index has been associated with significant improvement in overall quality of life, self-image, physical symptoms, and general health in studies assessing change in quality of life.  PHQ-9: Review Flowsheet       12/26/2022 01/01/2015  Depression screen PHQ 2/9  Decreased Interest 0 0  Down, Depressed, Hopeless 0 0  PHQ - 2 Score 0 0  Altered sleeping 3 -  Tired, decreased energy 1 -  Change in appetite 0 -  Feeling bad or failure about yourself  0 -  Trouble concentrating 0 -  Moving slowly or fidgety/restless 0 -  Suicidal thoughts 0 -  PHQ-9 Score 4 -  Difficult doing work/chores Somewhat difficult -    Details           Interpretation of Total Score  Total Score Depression Severity:  1-4 = Minimal depression, 5-9 = Mild depression, 10-14 = Moderate depression, 15-19 = Moderately severe depression,  20-27 = Severe depression   Psychosocial Evaluation and Intervention:  Psychosocial Evaluation - 12/26/22 0926       Psychosocial Evaluation & Interventions   Interventions Encouraged to exercise with the program and follow exercise prescription    Comments Dacota denies any psychosocial barriers to rehab    Expected Outcomes For Miara to participate in rehab free of psychosocial concerns.    Continue Psychosocial Services  No Follow up required             Psychosocial Re-Evaluation:  Psychosocial Re-Evaluation     Row Name 01/05/23 1350 02/02/23 1545           Psychosocial Re-Evaluation   Current issues with None Identified Current Stress Concerns      Comments No changes since starting the program. Arethia has attended 1 class so far. Aneila has stated to staff that she is dealing with a great deal of stress currently. Her 62 year old sister lives in an assisted living facility. Due to this Sadielynn has  been tasked with cleaning out her sisters house. This has brought on a great deal of stress for Somaly. Staff has encouraged Reathel to listen to her body and rest when needed. Staff has also educated Isyss on healthy ways to deal with stress.      Expected Outcomes For patient to attend PR without any psychosocial barriers or concerns For patient to attend PR without any psychosocial barriers or concerns      Interventions Encouraged to attend Pulmonary Rehabilitation for the exercise Encouraged to attend Pulmonary Rehabilitation for the exercise;Stress management education      Continue Psychosocial Services  No Follow up required Follow up required by staff               Psychosocial Discharge (Final Psychosocial Re-Evaluation):  Psychosocial Re-Evaluation - 02/02/23 1545       Psychosocial Re-Evaluation   Current issues with Current Stress Concerns    Comments Keiyonna has stated to staff that she is dealing with a great deal of stress currently. Her 38 year  old sister lives in an assisted living facility. Due to this Lavonn has been tasked with cleaning out her sisters house. This has brought on a great deal of stress for Chelcy. Staff has encouraged Kanesha to listen to her body and rest when needed. Staff has also educated Lavaughn on healthy ways to deal with stress.    Expected Outcomes For patient to attend PR without any psychosocial barriers or concerns    Interventions Encouraged to attend Pulmonary Rehabilitation for the exercise;Stress management education    Continue Psychosocial Services  Follow up required by staff             Education: Education Goals: Education classes will be provided on a weekly basis, covering required topics. Participant will state understanding/return demonstration of topics presented.  Learning Barriers/Preferences:  Learning Barriers/Preferences - 12/26/22 6578       Learning Barriers/Preferences   Learning Barriers Sight    Learning Preferences Skilled Demonstration;Video;Pictoral             Education Topics: Know Your Numbers Group instruction that is supported by a PowerPoint presentation. Instructor discusses importance of knowing and understanding resting, exercise, and post-exercise oxygen saturation, heart rate, and blood pressure. Oxygen saturation, heart rate, blood pressure, rating of perceived exertion, and dyspnea are reviewed along with a normal range for these values.    Exercise for the Pulmonary Patient Group instruction that is supported by a PowerPoint presentation. Instructor discusses benefits of exercise, core components of exercise, frequency, duration, and intensity of an exercise routine, importance of utilizing pulse oximetry during exercise, safety while exercising, and options of places to exercise outside of rehab.  Flowsheet Row PULMONARY REHAB OTHER RESPIRATORY from 02/05/2023 in [facility]  Date 02/05/23   Educator EP  Instruction Review Code 1- Verbalizes Understanding       MET Level  Group instruction provided by PowerPoint, verbal discussion, and written material to support subject matter. Instructor reviews what METs are and how to increase METs.  Flowsheet Row PULMONARY REHAB OTHER RESPIRATORY from 01/08/2023 in [facility]  Date 01/08/23  Educator EP  Instruction Review Code 1- Verbalizes Understanding       Pulmonary Medications Verbally interactive group education provided by instructor with focus on inhaled medications and proper administration. Flowsheet Row PULMONARY REHAB OTHER RESPIRATORY from 01/29/2023 in [facility], [facility]  Date 01/29/23  Educator RT  Instruction Review Code 1- Verbalizes Understanding       Anatomy and Physiology of the Respiratory System Group instruction provided by PowerPoint, verbal discussion, and written material to support subject matter. Instructor reviews respiratory cycle and anatomical components of the respiratory system and their functions. Instructor also reviews differences in obstructive and restrictive respiratory diseases with examples of each.  Flowsheet Row PULMONARY REHAB OTHER RESPIRATORY from 01/22/2023 in [facility] for [facility], V[facility], & Lung Health  Date 01/22/23  Educator RT  Instruction Review Code 1- Verbalizes Understanding       Oxygen Safety Group instruction provided by PowerPoint, verbal discussion, and written material to support subject matter. There is an overview of "What is Oxygen" and "Why do we need it".  Instructor also reviews how to create a safe environment for oxygen use, the importance of using oxygen as prescribed, and the risks of noncompliance. There is a brief discussion on traveling with oxygen and resources the patient may utilize.   Oxygen Use Group  instruction provided by PowerPoint, verbal discussion, and written material to discuss how supplemental oxygen is prescribed and different types of oxygen supply systems. Resources for more information are provided.    Breathing Techniques Group instruction that is supported by demonstration and informational handouts. Instructor discusses the benefits of pursed lip and diaphragmatic breathing and detailed demonstration on how to perform both.     Risk Factor Reduction Group instruction that is supported by a PowerPoint presentation. Instructor discusses the definition of a risk factor, different risk factors for pulmonary disease, and how the heart and lungs work together. Flowsheet Row PULMONARY REHAB OTHER RESPIRATORY from 01/01/2023 in [facility], [facility]  Date 01/01/23  Educator Ep  Instruction Review Code 1- Verbalizes Understanding       Pulmonary Diseases Group instruction provided by PowerPoint, verbal discussion, and written material to support subject matter. Instructor gives an overview of the different type of pulmonary diseases. There is also a discussion on risk factors and symptoms as well as ways to manage the diseases.   Stress and Energy Conservation Group instruction provided by PowerPoint, verbal discussion, and written material to support subject matter. Instructor gives an overview of stress and the impact it can have on the body. Instructor also reviews ways to reduce stress. There is also a discussion on energy conservation and ways to conserve energy throughout the day.   Warning Signs and Symptoms Group instruction provided by PowerPoint, verbal discussion, and written material to support subject matter. Instructor reviews warning signs and symptoms of stroke, heart attack, cold and flu. Instructor also reviews ways to prevent the spread of infection.   Other Education Group or individual verbal, written, or  video instructions that support the educational goals of the pulmonary rehab program. Flowsheet Row PULMONARY REHAB OTHER RESPIRATORY from 01/15/2023 in [facility] for [facility], & [facility]  Date 01/15/23  Educator RT  Instruction Review Code 1- Verbalizes Understanding        Knowledge Questionnaire Score:  Knowledge Questionnaire Score - 12/26/22 1036       Knowledge Questionnaire Score   Pre Score 16/18             Core Components/Risk Factors/Patient Goals at Admission:  Personal Goals and Risk Factors at Admission - 12/26/22 0927       Core Components/Risk  Factors/Patient Goals on Admission    Weight Management Weight Loss;Yes    Intervention Weight Management: Develop a combined nutrition and exercise program designed to reach desired caloric intake, while maintaining appropriate intake of nutrient and fiber, sodium and fats, and appropriate energy expenditure required for the weight goal.;Weight Management: Provide education and appropriate resources to help participant work on and attain dietary goals.;Weight Management/Obesity: Establish reasonable short term and long term weight goals.;Obesity: Provide education and appropriate resources to help participant work on and attain dietary goals.    Expected Outcomes Short Term: Continue to assess and modify interventions until short term weight is achieved;Long Term: Adherence to nutrition and physical activity/exercise program aimed toward attainment of established weight goal;Weight Maintenance: Understanding of the daily nutrition guidelines, which includes 25-35% calories from fat, 7% or less cal from saturated fats, less than 200mg  cholesterol, less than 1.5gm of sodium, & 5 or more servings of fruits and vegetables daily;Weight Loss: Understanding of general recommendations for a balanced deficit meal plan, which promotes 1-2 lb weight loss per week and includes a negative energy balance of  [phone] kcal/d;Understanding recommendations for meals to include 15-35% energy as protein, 25-35% energy from fat, 35-60% energy from carbohydrates, less than 200mg  of dietary cholesterol, 20-35 gm of total fiber daily;Understanding of distribution of calorie intake throughout the day with the consumption of 4-5 meals/snacks;Weight Gain: Understanding of general recommendations for a high calorie, high protein meal plan that promotes weight gain by distributing calorie intake throughout the day with the consumption for 4-5 meals, snacks, and/or supplements    Improve shortness of breath with ADL's Yes    Intervention Provide education, individualized exercise plan and daily activity instruction to help decrease symptoms of SOB with activities of daily living.    Expected Outcomes Short Term: Improve cardiorespiratory fitness to achieve a reduction of symptoms when performing ADLs;Long Term: Be able to perform more ADLs without symptoms or delay the onset of symptoms    Heart Failure Yes    Intervention Provide a combined exercise and nutrition program that is supplemented with education, support and counseling about heart failure. Directed toward relieving symptoms such as shortness of breath, decreased exercise tolerance, and extremity edema.    Expected Outcomes Improve functional capacity of life;Short term: Attendance in program 2-3 days a week with increased exercise capacity. Reported lower sodium intake. Reported increased fruit and vegetable intake. Reports medication compliance.;Short term: Daily weights obtained and reported for increase. Utilizing diuretic protocols set by physician.;Long term: Adoption of self-care skills and reduction of barriers for early signs and symptoms recognition and intervention leading to self-care maintenance.             Core Components/Risk Factors/Patient Goals Review:   Goals and Risk Factor Review     Row Name 01/05/23 1351 02/02/23 1553            Core Components/Risk Factors/Patient Goals Review   Personal Goals Review Weight Management/Obesity;Heart Failure;Improve shortness of breath with ADL's;Develop more efficient breathing techniques such as purse lipped breathing and diaphragmatic breathing and practicing self-pacing with activity. Weight Management/Obesity;Improve shortness of breath with ADL's;Heart Failure      Review Unable to evaluate progress yet. Wallie has attended 1 class so far. Goal progressing for weight loss. Ranezmae has been working with our dietician for weight loss goals. Goal progressing on improving her shortness of breath with ADLs. Goal met on developing more efficient breathing techniques such as purse lipped breathing and diaphragmatic breathing; and practicing self-pacing with  activity. Alyna is able to demonstrtae purse lip breathing when she gets short of breath. She also knows how to pace herself to help with shortness of breath.  Goal progressing on heart failure. We will continue to monitor her progress throughout the program.      Expected Outcomes For Mailyn to lose weight, improve her shortness of breath with ADLs, develop more efficient breathing techniques, and have no HF exacerbations during her time in PR See admission goals               Core Components/Risk Factors/Patient Goals at Discharge (Final Review):   Goals and Risk Factor Review - 02/02/23 1553       Core Components/Risk Factors/Patient Goals Review   Personal Goals Review Weight Management/Obesity;Improve shortness of breath with ADL's;Heart Failure    Review Goal progressing for weight loss. Boneta has been working with our dietician for weight loss goals. Goal progressing on improving her shortness of breath with ADLs. Goal met on developing more efficient breathing techniques such as purse lipped breathing and diaphragmatic breathing; and practicing self-pacing with activity. Jazara is able to demonstrtae purse lip breathing when  she gets short of breath. She also knows how to pace herself to help with shortness of breath.  Goal progressing on heart failure. We will continue to monitor her progress throughout the program.    Expected Outcomes See admission goals             ITP Comments: Pt is making expected progress toward Pulmonary Rehab goals after completing 12 sessions. Recommend continued exercise, life style modification, education, and utilization of breathing techniques to increase stamina and strength, while also decreasing shortness of breath with exertion.  Dr. Mechele Collin is Medical Director for Pulmonary Rehab at [facility].

## 2023-02-12 ENCOUNTER — Encounter (HOSPITAL_COMMUNITY)
Admission: RE | Admit: 2023-02-12 | Discharge: 2023-02-12 | Disposition: A | Discharge status: Home / Self Care | Payer: Medicare Other | Source: Ambulatory Visit | Attending: Internal Medicine | Admitting: Internal Medicine

## 2023-02-12 DIAGNOSIS — J4550 Severe persistent asthma, uncomplicated: Secondary | ICD-10-CM | POA: Diagnosis not present

## 2023-02-12 NOTE — Progress Notes (Signed)
Daily Session Note  Patient Details  Name: [name] MRN: [id number] Date of Birth: May 08, 1933 Referring Provider:   Doristine Devoid Pulmonary Rehab Walk Test from 12/26/2022 in [facility]  Referring Provider Ramawamy       Encounter Date: 02/12/2023  Check In:  Session Check In - 02/12/23 1136       Check-In   Supervising physician immediately available to respond to emergencies [facility] MD immediately available    Physician(s) Joni Reining, NP    Location [facility]    Staff Present Raford Pitcher, MS, ACSM-CEP, Exercise Physiologist;Casey Erin Sons BS, ACSM-CEP, Exercise Physiologist;Mary Gerre Scull, RN, BSN    Virtual Visit No    Medication changes reported     No    Fall or balance concerns reported    No    Tobacco Cessation No Change    Warm-up and Cool-down Performed as group-led instruction    Resistance Training Performed Yes    VAD Patient? No    PAD/SET Patient? No      Pain Assessment   Currently in Pain? No/denies    Multiple Pain Sites No             Capillary Blood Glucose: No results found. However, due to the size of the patient record, not all encounters were searched. Please check Results Review for a complete set of results.    Social History   Tobacco Use  Smoking Status Never  Smokeless Tobacco Never    Goals Met:  Proper associated with RPD/PD & O2 Sat Independence with exercise equipment Exercise tolerated well No report of concerns or symptoms today Strength training completed today  Goals Unmet:  Not Applicable  Comments: Service time is from 1011 to 1154.    Dr. Mechele Collin is Medical Director for Pulmonary Rehab at [facility].

## 2023-02-17 ENCOUNTER — Encounter (HOSPITAL_COMMUNITY)
Admission: RE | Admit: 2023-02-17 | Discharge: 2023-02-17 | Disposition: A | Discharge status: Home / Self Care | Payer: Medicare Other | Source: Ambulatory Visit | Attending: Internal Medicine | Admitting: Internal Medicine

## 2023-02-17 DIAGNOSIS — J4550 Severe persistent asthma, uncomplicated: Secondary | ICD-10-CM | POA: Diagnosis not present

## 2023-02-17 NOTE — Progress Notes (Signed)
Daily Session Note  Patient Details  Name: [name] MRN: [id number] Date of Birth: 1933-02-10 Referring Provider:   Doristine Devoid Pulmonary Rehab Walk Test from 12/26/2022 in [facility] for [facility], Vascular, & Lung Health  Referring Provider Ramawamy       Encounter Date: 02/17/2023  Check In:  Session Check In - 02/17/23 1105       Check-In   Supervising physician immediately available to respond to emergencies [facility] MD immediately available    Physician(s) Joni Reining, NP    Location [facility]    Staff Present Raford Pitcher, MS, ACSM-CEP, Exercise Physiologist;Lavelle Akel Erin Sons BS, ACSM-CEP, Exercise Physiologist    Virtual Visit No    Medication changes reported     No    Fall or balance concerns reported    No    Tobacco Cessation No Change    Warm-up and Cool-down Performed as group-led instruction    Resistance Training Performed Yes    VAD Patient? No    PAD/SET Patient? No      Pain Assessment   Currently in Pain? No/denies    Multiple Pain Sites No             Capillary Blood Glucose: No results found. However, due to the size of the patient record, not all encounters were searched. Please check Results Review for a complete set of results.    Social History   Tobacco Use  Smoking Status Never  Smokeless Tobacco Never    Goals Met:  Proper associated with RPD/PD & O2 Sat Independence with exercise equipment Exercise tolerated well No report of concerns or symptoms today Strength training completed today  Goals Unmet:  Not Applicable  Comments: Service time is from 1002 to 1135.    Dr. Mechele Collin is Medical Director for Pulmonary Rehab at [facility].

## 2023-02-19 ENCOUNTER — Encounter (HOSPITAL_COMMUNITY)
Admission: RE | Admit: 2023-02-19 | Discharge: 2023-02-19 | Disposition: A | Discharge status: Home / Self Care | Payer: Medicare Other | Source: Ambulatory Visit | Attending: Internal Medicine | Admitting: Internal Medicine

## 2023-02-19 DIAGNOSIS — J4550 Severe persistent asthma, uncomplicated: Secondary | ICD-10-CM | POA: Diagnosis not present

## 2023-02-19 NOTE — Progress Notes (Signed)
Home Exercise Prescription I have reviewed a Home Exercise Prescription with Jola Babinski Chuong. Pieper is not consistently exercising at home. I discussed the difference between ADL's and exercise. Cyann voiced understanding. I encouraged Sitlaly to start walking 3-5 days/wk for 30 min/day inside her home or outside on her driveway. She agreed with my recommendations. 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 This limits her time substantially. Will continue to check up on her home exercise. The patient stated that their goals were to maintain current health. We reviewed exercise guidelines, target heart rate during exercise, RPE Scale, weather conditions, endpoints for exercise, warmup and cool down. The patient is encouraged to come to me with any questions. I will continue to follow up with the patient to assist them with progression and safety. Spent 15 min with patient discussing home exercise plan and goals  Joya San, MS, ACSM-CEP 02/19/2023 3:25 PM

## 2023-02-19 NOTE — Progress Notes (Signed)
Daily Session Note  Patient Details  Name: [name] MRN: [id number] Date of Birth: Apr 27, 1933 Referring Provider:   Doristine Devoid Pulmonary Rehab Walk Test from 12/26/2022 in [facility]  Referring Provider Ramawamy       Encounter Date: 02/19/2023  Check In:  Session Check In - 02/19/23 1052       Check-In   Supervising physician immediately available to respond to emergencies [facility] MD immediately available    Physician(s) Jari Favre, NP    Location [facility]    Staff Present Raford Pitcher, MS, ACSM-CEP, Exercise Physiologist;Casey Erin Sons BS, ACSM-CEP, Exercise Physiologist;Jacqulyn Barresi Gerre Scull, RN, BSN;Samantha Belarus, RD, LDN    Virtual Visit No    Medication changes reported     No    Fall or balance concerns reported    No    Tobacco Cessation No Change    Warm-up and Cool-down Performed as group-led instruction    Resistance Training Performed Yes    VAD Patient? No    PAD/SET Patient? No      Pain Assessment   Currently in Pain? No/denies    Multiple Pain Sites No             Capillary Blood Glucose: No results found. However, due to the size of the patient record, not all encounters were searched. Please check Results Review for a complete set of results.    Social History   Tobacco Use  Smoking Status Never  Smokeless Tobacco Never    Goals Met:  Independence with exercise equipment Exercise tolerated well No report of concerns or symptoms today Strength training completed today  Goals Unmet:  Not Applicable  Comments: Service time is from 1009 to 1142    Dr. Mechele Collin is Medical Director for Pulmonary Rehab at [facility].

## 2023-02-24 ENCOUNTER — Encounter (HOSPITAL_COMMUNITY): Payer: Self-pay

## 2023-02-24 ENCOUNTER — Encounter (HOSPITAL_COMMUNITY)
Admission: RE | Admit: 2023-02-24 | Discharge: 2023-02-24 | Disposition: A | Discharge status: Home / Self Care | Payer: Medicare Other | Source: Ambulatory Visit | Attending: Internal Medicine | Admitting: Internal Medicine

## 2023-02-24 VITALS — Wt 140.4 lb

## 2023-02-24 DIAGNOSIS — J4550 Severe persistent asthma, uncomplicated: Secondary | ICD-10-CM | POA: Diagnosis not present

## 2023-02-24 NOTE — Progress Notes (Signed)
Daily Session Note  Patient Details  Name: [name] MRN: [id number] Date of Birth: 02/19/1933 Referring Provider:   Doristine Devoid Pulmonary Rehab Walk Test from 12/26/2022 in [facility]  Referring Provider Ramawamy       Encounter Date: 02/24/2023  Check In:  Session Check In - 02/24/23 1120       Check-In   Supervising physician immediately available to respond to emergencies [facility] immediately available    Physician(s) Jari Favre, NP    [facility]    Staff Present Durel Salts, RT;Randi Idelle Crouch BS, ACSM-CEP, Exercise Physiologist;Charly Hunton Gerre Scull, RN, BSN;Samantha Belarus, RD, LDN;Johnny Hale Bogus, MS, Exercise Physiologist    Virtual Visit No    Medication changes reported     No    Fall or balance concerns reported    No    Tobacco Cessation No Change    Warm-up and Cool-down Performed as group-led instruction    Resistance Training Performed Yes    VAD Patient? No    PAD/SET Patient? No      Pain Assessment   Currently in Pain? No/denies    Multiple Pain Sites No             Capillary Blood Glucose: No results found. However, due to the size of the patient record, not all encounters were searched. Please check Results Review for a complete set of results.   Exercise Prescription Changes - 02/24/23 1200       Response to Exercise   Blood Pressure (Admit) 148/56    Blood Pressure (Exercise) 150/62    Blood Pressure (Exit) 116/60    Heart Rate (Admit) 79 bpm    Heart Rate (Exercise) 92 bpm    Heart Rate (Exit) 85 bpm    Oxygen Saturation (Admit) 98 %    Oxygen Saturation (Exercise) 96 %    Oxygen Saturation (Exit) 98 %    Rating of Perceived Exertion (Exercise) 13    Perceived Dyspnea (Exercise) 2    Duration Continue with 30 min of aerobic exercise without signs/symptoms of physical distress.    Intensity THRR unchanged      Resistance Training   Training Prescription Yes     Weight red bands    Reps 10-15    Time 10 Minutes      NuStep   Level 3    SPM 86    Minutes 15    METs 3      Track   Laps 7    Minutes 15    METs 2.08             Social History   Tobacco Use  Smoking Status Never  Smokeless Tobacco Never    Goals Met:  Independence with exercise equipment Exercise tolerated well No report of concerns or symptoms today Strength training completed today  Goals Unmet:  Not Applicable  Comments: Service time is from 1010 to 1140  Dr. Mechele Collin is Medical Director for Pulmonary Rehab at [facility].

## 2023-02-26 ENCOUNTER — Encounter (HOSPITAL_COMMUNITY): Payer: Medicare Other

## 2023-03-03 ENCOUNTER — Encounter (HOSPITAL_COMMUNITY)
Admission: RE | Admit: 2023-03-03 | Discharge: 2023-03-03 | Disposition: A | Discharge status: Home / Self Care | Payer: Medicare Other | Source: Ambulatory Visit | Attending: Internal Medicine

## 2023-03-03 DIAGNOSIS — J4550 Severe persistent asthma, uncomplicated: Secondary | ICD-10-CM | POA: Diagnosis not present

## 2023-03-03 NOTE — Progress Notes (Signed)
Daily Session Note  Patient Details  Name: [name] MRN: [id number] Date of Birth: November 22, 1932 Referring Provider:   Doristine Devoid Pulmonary Rehab Walk Test from 12/26/2022 in [facility]  Referring Provider Ramawamy       Encounter Date: 03/03/2023  Check In:  Session Check In - 03/03/23 1116       Check-In   Supervising physician immediately available to respond to emergencies [facility] immediately available    Physician(s) Edd Fabian, NP    [facility]    Staff Present Durel Salts, RT;Randi Idelle Crouch BS, ACSM-CEP, Exercise Physiologist;Nechelle Petrizzo Gerre Scull, RN, BSN;Samantha Belarus, RD, Rexene Agent, MS, ACSM-CEP, Exercise Physiologist    Virtual Visit No    Medication changes reported     No    Fall or balance concerns reported    No    Tobacco Cessation No Change    Warm-up and Cool-down Performed as group-led instruction    Resistance Training Performed Yes    VAD Patient? No    PAD/SET Patient? No      Pain Assessment   Currently in Pain? No/denies    Multiple Pain Sites No             Capillary Blood Glucose: No results found. However, due to the size of the patient record, not all encounters were searched. Please check Results Review for a complete set of results.    Social History   Tobacco Use  Smoking Status Never  Smokeless Tobacco Never    Goals Met:  Independence with exercise equipment Exercise tolerated well No report of concerns or symptoms today Strength training completed today  Goals Unmet:  Not Applicable  Comments: Service time is from 1012 to 1138  Dr. Mechele Collin is Medical Director for Pulmonary Rehab at [facility].

## 2023-03-05 ENCOUNTER — Encounter (HOSPITAL_COMMUNITY)
Admission: RE | Admit: 2023-03-05 | Discharge: 2023-03-05 | Disposition: A | Discharge status: Home / Self Care | Payer: Medicare Other | Source: Ambulatory Visit | Attending: Internal Medicine | Admitting: Internal Medicine

## 2023-03-05 DIAGNOSIS — J4550 Severe persistent asthma, uncomplicated: Secondary | ICD-10-CM

## 2023-03-05 NOTE — Progress Notes (Signed)
Daily Session Note  Patient Details  Name: Jamiah Villas MRN: [id number] Date of Birth: 16-Oct-1932 Referring Provider:   Doristine Devoid Pulmonary Rehab Walk Test from 12/26/2022 in [facility]  Referring Provider Ramawamy       Encounter Date: 03/05/2023  Check In:  Session Check In - 03/05/23 1031       Check-In   Supervising physician immediately available to respond to emergencies [facility] immediately available    Physician(s) Eligha Bridegroom, NP    [facility]    Staff Present Durel Salts, RT;Cartel Mauss Idelle Crouch BS, ACSM-CEP, Exercise Physiologist;Mary Gerre Scull, RN, BSN;Samantha Belarus, RD, Rexene Agent, MS, ACSM-CEP, Exercise Physiologist    Virtual Visit No    Medication changes reported     No    Fall or balance concerns reported    No    Tobacco Cessation No Change    Warm-up and Cool-down Performed as group-led instruction    Resistance Training Performed Yes    VAD Patient? No    PAD/SET Patient? No      Pain Assessment   Currently in Pain? No/denies    Multiple Pain Sites No             Capillary Blood Glucose: No results found. However, due to the size of the patient record, not all encounters were searched. Please check Results Review for a complete set of results.    Social History   Tobacco Use  Smoking Status Never  Smokeless Tobacco Never    Goals Met:  Independence with exercise equipment Exercise tolerated well No report of concerns or symptoms today Strength training completed today  Goals Unmet:  Not Applicable  Comments: Service time is from 1010 to 1140.  Dr. Mechele Collin is Medical Director for Pulmonary Rehab at [facility].

## 2023-03-10 ENCOUNTER — Encounter (HOSPITAL_COMMUNITY)
Admission: RE | Admit: 2023-03-10 | Discharge: 2023-03-10 | Disposition: A | Discharge status: Home / Self Care | Payer: Medicare Other | Source: Ambulatory Visit | Attending: Internal Medicine | Admitting: Internal Medicine

## 2023-03-10 VITALS — Wt 141.5 lb

## 2023-03-10 DIAGNOSIS — J4550 Severe persistent asthma, uncomplicated: Secondary | ICD-10-CM

## 2023-03-10 NOTE — Progress Notes (Signed)
Daily Session Note  Patient Details  Name: [name] MRN: [id number] Date of Birth: 1933-01-12 Referring Provider:   Doristine Devoid Pulmonary Rehab Walk Test from 12/26/2022 in [facility]  Referring Provider Ramawamy       Encounter Date: 03/10/2023  Check In:  Session Check In - 03/10/23 1109       Check-In   Supervising physician immediately available to respond to emergencies [facility] immediately available    Physician(s) Robin Searing, NP    [facility]    Staff Present Durel Salts, Patriciaann Clan, RN, BSN;Samantha Belarus, RD, Rexene Agent, MS, ACSM-CEP, Exercise Physiologist;Annedrea Remus Loffler, RN, [facility]    Virtual Visit No    Medication changes reported     No    Fall or balance concerns reported    No    Tobacco Cessation No Change    Warm-up and Cool-down Performed as group-led instruction    Resistance Training Performed Yes    VAD Patient? No    PAD/SET Patient? No      Pain Assessment   Currently in Pain? No/denies    Multiple Pain Sites No             Capillary Blood Glucose: No results found. However, due to the size of the patient record, not all encounters were searched. Please check Results Review for a complete set of results.   Exercise Prescription Changes - 03/10/23 1100       Response to Exercise   Blood Pressure (Admit) 124/68    Blood Pressure (Exercise) 140/60    Blood Pressure (Exit) 110/60    Heart Rate (Admit) 78 bpm    Heart Rate (Exercise) 94 bpm    Heart Rate (Exit) 86 bpm    Oxygen Saturation (Admit) 97 %    Oxygen Saturation (Exercise) 97 %    Oxygen Saturation (Exit) 97 %    Rating of Perceived Exertion (Exercise) 13    Perceived Dyspnea (Exercise) 2    Duration Continue with 30 min of aerobic exercise without signs/symptoms of physical distress.    Intensity THRR unchanged      Resistance Training   Training Prescription Yes     Weight red bands    Reps 10-15    Time 10 Minutes      NuStep   Level 3    SPM 81    Minutes 15    METs 3.2      Track   Laps 8    Minutes 15    METs 2.23             Social History   Tobacco Use  Smoking Status Never  Smokeless Tobacco Never    Goals Met:  Independence with exercise equipment Exercise tolerated well No report of concerns or symptoms today Strength training completed today  Goals Unmet:  Not Applicable  Comments: Service time is from 1007 to 1138  Dr. Mechele Collin is Medical Director for Pulmonary Rehab at [facility].

## 2023-03-11 NOTE — Progress Notes (Signed)
Pulmonary Individual Treatment Plan  Patient Details  Name: [name] MRN: [id number] Date of Birth: February 01, 1933 Referring Provider:   Doristine Devoid Pulmonary Rehab Walk Test from 12/26/2022 in [facility]  Referring Provider Ramawamy       Initial Encounter Date:  Flowsheet Row Pulmonary Rehab Walk Test from 12/26/2022 in [facility]  Date 12/26/22       Visit Diagnosis: Severe persistent asthma without complication  Patient's Home Medications on Admission:   Current Outpatient Medications:    Abaloparatide (TYMLOS) 3120 MCG/1.56ML SOPN, Inject Subcutaneous each morning, Disp: , Rfl:    albuterol (PROVENTIL) (2.5 MG/3ML) 0.083% nebulizer solution, Take 3 mLs (2.5 mg total) by nebulization every 6 (six) hours as needed for wheezing or shortness of breath., Disp: 75 mL, Rfl: 12   albuterol (VENTOLIN HFA) 108 (90 Base) MCG/ACT inhaler, TAKE 2 PUFFS BY MOUTH EVERY 6 HOURS AS NEEDED FOR WHEEZE OR SHORTNESS OF BREATH, Disp: 8.5 each, Rfl: 5   budesonide-formoterol (SYMBICORT) 160-4.5 MCG/ACT inhaler, Inhale 2 puffs into the lungs in the morning and at bedtime., Disp: 10.2 g, Rfl: 5   Calcium Carbonate-Vitamin D 600-400 MG-UNIT tablet, Take 1 tablet by mouth daily., Disp: , Rfl:    chlorpheniramine (CHLOR-TRIMETON) 4 MG tablet, Take 4-8 mg by mouth See admin instructions. 4 mg in the morning and 8 mg at bedtime, Disp: , Rfl:    Cholecalciferol (VITAMIN D3) 1000 UNITS tablet, Take 1,000 Units by mouth every evening. , Disp: , Rfl:    dextromethorphan (DELSYM) 30 MG/5ML liquid, Take 60 mg by mouth 2 (two) times daily as needed for cough., Disp: , Rfl:    docusate sodium (COLACE) 100 MG capsule, Take 1 capsule (100 mg total) by mouth 2 (two) times daily., Disp: 10 capsule, Rfl: 0   dupilumab (DUPIXENT) 200 MG/1. prefilled syringe, Inject 200 mg into the skin every  14 (fourteen) days., Disp: 6.84 mL, Rfl: 2   fexofenadine (ALLEGRA) 180 MG tablet, Take 180 mg by mouth daily.  , Disp: , Rfl:    furosemide (LASIX) 20 MG tablet, TAKE 1 TABLET (20 MG TOTAL) BY MOUTH DAILY AS NEEDED FOR FLUID OR EDEMA., Disp: 90 tablet, Rfl: 2   ipratropium (ATROVENT) 0.06 % nasal spray, Place 2 sprays into both nostrils 2 (two) times daily., Disp: 15 mL, Rfl: 6   mirtazapine (REMERON) 15 MG tablet, Take 15 mg by mouth at bedtime., Disp: , Rfl:    mometasone (NASONEX) 50 MCG/ACT nasal spray, USE 2 SPRAYS NASALLY 2 TIMES DAILY, Disp: 17 g, Rfl: 11   montelukast (SINGULAIR) 10 MG tablet, TAKE 1 TABLET BY MOUTH EVERYDAY AT BEDTIME (Patient not taking: Reported on 12/26/2022), Disp: 90 tablet, Rfl: 2   Multiple Vitamin (MULTIVITAMIN WITH MINERALS) TABS tablet, Take 1 tablet by mouth daily. Centrum Silver Multivitamin, Disp: , Rfl:    multivitamin-lutein (OCUVITE-LUTEIN) CAPS capsule, Take 1 capsule by mouth daily., Disp: , Rfl:    mupirocin ointment (BACTROBAN) 2 %, as needed. , Disp: , Rfl:    nitroGLYCERIN (NITROSTAT) 0.4 MG SL tablet, Place 1 tablet (0.4 mg total) under the tongue every 5 (five) minutes as needed for chest pain. (Patient not taking: Reported on 12/26/2022), Disp: 25 tablet, Rfl: 3   pantoprazole (PROTONIX) 40 MG tablet, Take 40 mg by mouth daily., Disp: , Rfl:    potassium chloride (KLOR-CON) 10 MEQ tablet, Take 1 tablet (10 mEq  total) by mouth daily., Disp: 90 tablet, Rfl: 3   pramipexole (MIRAPEX) 0.125 MG tablet, Take 0.375 mg by mouth at bedtime., Disp: , Rfl:    rivaroxaban (XARELTO) 10 MG TABS tablet, Take 10 mg by mouth daily., Disp: , Rfl:    rosuvastatin (CRESTOR) 5 MG tablet, TAKE 1 TABLET BY MOUTH EVERYDAY AT BEDTIME, Disp: 90 tablet, Rfl: 3   Spacer/Aero-Holding Chambers (AEROCHAMBER MV) inhaler, by Other route. Use as instructed , Disp: , Rfl:    telmisartan (MICARDIS) 20 MG tablet, Take 20 mg by mouth daily. , Disp: , Rfl:    vitamin B-12  (CYANOCOBALAMIN) 500 MCG tablet, Take 500 mcg by mouth daily.  , Disp: , Rfl:   Current Facility-Administered Medications:    albuterol (PROVENTIL) (2.5 MG/3ML) 0.083% nebulizer solution 2.5 mg, 2.5 mg, Nebulization, Q6H PRN, Bevelyn Ngo, NP, 2.5 mg at 10/08/21 0918   albuterol (VENTOLIN HFA) 108 (90 Base) MCG/ACT inhaler 2 puff, 2 puff, Inhalation, Once PRN, Causey, Larna Daughters, NP   diphenhydrAMINE (BENADRYL) injection 50 mg, 50 mg, Intramuscular, Once PRN, Causey, Larna Daughters, NP   EPINEPHrine (EPI-PEN) injection 0.3 mg, 0.3 mg, Intramuscular, Once PRN, Causey, Larna Daughters, NP  Past Medical History: Past Medical History:  Diagnosis Date   Anemia    after hysterectomy   Angio-edema    Asthma, severe persistent    pulmologist-- Ramaswamy   Cancer [facility])    Breast cancer on right   Chronic kidney disease, stage II (mild)    DDD (degenerative disc disease), cervical    Diverticulitis    Diverticulosis yrs ago   hx of    Edema, lower extremity    occ both legs swell   GERD (gastroesophageal reflux disease)    Headache    sinus headaches   Heart murmur    no problems - per pt   History of breast cancer 1990 left mastectomy also   1989  S/P   RIGHT MASTECTOMY ;  NO CHEMORADIATION //   NO RECURRENCE   History of DVT of lower extremity 5 yrs ago   right leg   History of shingles    Hyperlipidemia    Internal hemorrhoid    Leg ulcer, left (HCC)    Osteoporosis, unspecified    Knee and hip osteoarthritis bilaterally   Perennial allergic rhinitis    Peripheral vascular disease (HCC)    Pneumonia    Restless legs syndrome (RLS)    Rheumatoid arthritis (HCC)    hands   Thyroid nodule    followed by dr perrini yearly, no current problam   Unspecified essential hypertension    Urticaria     Tobacco Use: Social History   Tobacco Use  Smoking Status Never  Smokeless Tobacco Never    Labs: Review Flowsheet       Latest Ref Rng & Units 11/10/2011  03/03/2013 02/23/2014 03/19/2017  Labs for ITP Cardiac and Pulmonary Rehab  Cholestrol 100 - 199 mg/dL 130  865  784  696   LDL (calc) 0 - 99 mg/dL 49  62  79  61   HDL-C >39 mg/dL 29.52  841.32  44.01  027   Trlycerides 0 - 149 mg/dL 25.3  66.4  40.3  57     Details            Capillary Blood Glucose: No results found for: "GLUCAP"   Pulmonary Assessment Scores:  Pulmonary Assessment Scores     Row Name 12/26/22 [phone]  ADL UCSD   ADL Phase Entry     SOB Score total 46       CAT Score   CAT Score 19       mMRC Score   mMRC Score 3             UCSD: Self-administered rating of dyspnea associated with activities of daily living (ADLs) 6-point scale (0 = "not at all" to 5 = "maximal or unable to do because of breathlessness")  Scoring Scores range from 0 to 120.  Minimally important difference is 5 units  CAT: CAT can identify the health impairment of COPD patients and is better correlated with disease progression.  CAT has a scoring range of zero to 40. The CAT score is classified into four groups of low (less than 10), medium (10 - 20), high (21-30) and very high (31-40) based on the impact level of disease on health status. A CAT score over 10 suggests significant symptoms.  A worsening CAT score could be explained by an exacerbation, poor medication adherence, poor inhaler technique, or progression of COPD or comorbid conditions.  CAT MCID is 2 points  mMRC: mMRC (Modified Medical Research Council) Dyspnea Scale is used to assess the degree of baseline functional disability in patients of respiratory disease due to dyspnea. No minimal important difference is established. A decrease in score of 1 point or greater is considered a positive change.   Pulmonary Function Assessment:  Pulmonary Function Assessment - 12/26/22 1051       Breath   Bilateral Breath Sounds Clear    Shortness of Breath Yes;Limiting activity             Exercise Target  Goals: Exercise Program Goal: Individual exercise prescription set using results from initial 6 min walk test and THRR while considering  patient's activity barriers and safety.   Exercise Prescription Goal: Initial exercise prescription builds to 30-45 minutes a day of aerobic activity, 2-3 days per week.  Home exercise guidelines will be given to patient during program as part of exercise prescription that the participant will acknowledge.  Activity Barriers & Risk Stratification:  Activity Barriers & Cardiac Risk Stratification - 12/26/22 0924       Activity Barriers & Cardiac Risk Stratification   Activity Barriers Arthritis;Back Problems;Deconditioning;Muscular Weakness;Shortness of Breath;Right Hip Replacement;Joint Problems    Comments waiting for right shoulder replacement             6 Minute Walk:  6 Minute Walk     Row Name 12/26/22 1042         6 Minute Walk   Phase Initial     Distance 1022 feet     Walk Time 6 minutes     # of Rest Breaks 2  3:10-3:37, 5:37-6:00     MPH 1.97     METS 1.52     RPE 13     Perceived Dyspnea  2     VO2 Peak 5.34     Symptoms No     Resting HR 80 bpm     Resting BP 140/74     Resting Oxygen Saturation  100 %     Exercise Oxygen Saturation  during 6 min walk 95 %     Max Ex. HR 89 bpm     Max Ex. BP 150/80     2 Minute Post BP 148/78       Interval HR   1 Minute HR 82  2 Minute HR 86     3 Minute HR 86     4 Minute HR 85     5 Minute HR 89     6 Minute HR 89     2 Minute Post HR 85     Interval Heart Rate? Yes       Interval Oxygen   Interval Oxygen? Yes     Baseline Oxygen Saturation % 100 %     1 Minute Oxygen Saturation % 98 %     1 Minute Liters of Oxygen 0 L     2 Minute Oxygen Saturation % 95 %     2 Minute Liters of Oxygen 0 L     3 Minute Oxygen Saturation % 97 %     3 Minute Liters of Oxygen 0 L     4 Minute Oxygen Saturation % 99 %     4 Minute Liters of Oxygen 0 L     5 Minute Oxygen Saturation  % 100 %     5 Minute Liters of Oxygen 0 L     6 Minute Oxygen Saturation % 96 %     6 Minute Liters of Oxygen 0 L     2 Minute Post Oxygen Saturation % 97 %     2 Minute Post Liters of Oxygen 0 L              Oxygen Initial Assessment:  Oxygen Initial Assessment - 12/26/22 0927       Home Oxygen   Home Oxygen Device None    Sleep Oxygen Prescription None    Home Exercise Oxygen Prescription None    Home Resting Oxygen Prescription None      Initial 6 min Walk   Oxygen Used None      Program Oxygen Prescription   Program Oxygen Prescription None      Intervention   Short Term Goals To learn and understand importance of maintaining oxygen saturations>88%;To learn and demonstrate proper use of respiratory medications;To learn and understand importance of monitoring SPO2 with pulse oximeter and demonstrate accurate use of the pulse oximeter.;To learn and demonstrate proper pursed lip breathing techniques or other breathing techniques.     Long  Term Goals Verbalizes importance of monitoring SPO2 with pulse oximeter and return demonstration;Maintenance of O2 saturations>88%;Exhibits proper breathing techniques, such as pursed lip breathing or other method taught during program session;Compliance with respiratory medication;Demonstrates proper use of MDI's             Oxygen Re-Evaluation:  Oxygen Re-Evaluation     Row Name 01/07/23 0950 02/04/23 0805 03/03/23 1616         Program Oxygen Prescription   Program Oxygen Prescription None None None       Home Oxygen   Home Oxygen Device None None None     Sleep Oxygen Prescription None None None     Home Exercise Oxygen Prescription None None None     Home Resting Oxygen Prescription None None None       Goals/Expected Outcomes   Short Term Goals To learn and understand importance of maintaining oxygen saturations>88%;To learn and demonstrate proper use of respiratory medications;To learn and understand importance of  monitoring SPO2 with pulse oximeter and demonstrate accurate use of the pulse oximeter.;To learn and demonstrate proper pursed lip breathing techniques or other breathing techniques.  To learn and understand importance of maintaining oxygen saturations>88%;To learn and demonstrate proper use of respiratory medications;To learn and  understand importance of monitoring SPO2 with pulse oximeter and demonstrate accurate use of the pulse oximeter.;To learn and demonstrate proper pursed lip breathing techniques or other breathing techniques.  To learn and understand importance of maintaining oxygen saturations>88%;To learn and demonstrate proper use of respiratory medications;To learn and understand importance of monitoring SPO2 with pulse oximeter and demonstrate accurate use of the pulse oximeter.;To learn and demonstrate proper pursed lip breathing techniques or other breathing techniques.      Long  Term Goals Verbalizes importance of monitoring SPO2 with pulse oximeter and return demonstration;Maintenance of O2 saturations>88%;Exhibits proper breathing techniques, such as pursed lip breathing or other method taught during program session;Compliance with respiratory medication;Demonstrates proper use of MDI's Verbalizes importance of monitoring SPO2 with pulse oximeter and return demonstration;Maintenance of O2 saturations>88%;Exhibits proper breathing techniques, such as pursed lip breathing or other method taught during program session;Compliance with respiratory medication;Demonstrates proper use of MDI's Verbalizes importance of monitoring SPO2 with pulse oximeter and return demonstration;Maintenance of O2 saturations>88%;Exhibits proper breathing techniques, such as pursed lip breathing or other method taught during program session;Compliance with respiratory medication;Demonstrates proper use of MDI's     Goals/Expected Outcomes Compliance and understanding of oxygen saturation monitoring and breathing  techniques to decrease shortness of breath. Compliance and understanding of oxygen saturation monitoring and breathing techniques to decrease shortness of breath. Compliance and understanding of oxygen saturation monitoring and breathing techniques to decrease shortness of breath.              Oxygen Discharge (Final Oxygen Re-Evaluation):  Oxygen Re-Evaluation - 03/03/23 1616       Program Oxygen Prescription   Program Oxygen Prescription None      Home Oxygen   Home Oxygen Device None    Sleep Oxygen Prescription None    Home Exercise Oxygen Prescription None    Home Resting Oxygen Prescription None      Goals/Expected Outcomes   Short Term Goals To learn and understand importance of maintaining oxygen saturations>88%;To learn and demonstrate proper use of respiratory medications;To learn and understand importance of monitoring SPO2 with pulse oximeter and demonstrate accurate use of the pulse oximeter.;To learn and demonstrate proper pursed lip breathing techniques or other breathing techniques.     Long  Term Goals Verbalizes importance of monitoring SPO2 with pulse oximeter and return demonstration;Maintenance of O2 saturations>88%;Exhibits proper breathing techniques, such as pursed lip breathing or other method taught during program session;Compliance with respiratory medication;Demonstrates proper use of MDI's    Goals/Expected Outcomes Compliance and understanding of oxygen saturation monitoring and breathing techniques to decrease shortness of breath.             Initial Exercise Prescription:  Initial Exercise Prescription - 12/26/22 1000       Date of Initial Exercise RX and Referring Provider   Date 12/26/22    Referring Provider Ramawamy    Expected Discharge Date 03/26/23      NuStep   Level 1    SPM 50    Minutes 30    METs 1.5      Prescription Details   Frequency (times per week) 2    Duration Progress to 30 minutes of continuous aerobic without  signs/symptoms of physical distress      Intensity   THRR 40-80% of Max Heartrate 53-106    Ratings of Perceived Exertion 11-13    Perceived Dyspnea 0-4      Progression   Progression Continue to progress workloads to maintain intensity without signs/symptoms of physical  distress.      Resistance Training   Training Prescription Yes    Weight red bands    Reps 10-15             Perform Capillary Blood Glucose checks as needed.  Exercise Prescription Changes:   Exercise Prescription Changes     Row Name 01/13/23 1200 01/27/23 1200 02/10/23 1100 02/19/23 1500 02/24/23 1200     Response to Exercise   Blood Pressure (Admit) 138/66 122/72 120/60 -- 148/56   Blood Pressure (Exercise) 150/68 166/68 156/68 -- 150/62   Blood Pressure (Exit) 140/62 146/72 120/60 -- 116/60   Heart Rate (Admit) 87 bpm 73 bpm 76 bpm -- 79 bpm   Heart Rate (Exercise) 97 bpm 96 bpm 95 bpm -- 92 bpm   Heart Rate (Exit) 89 bpm 88 bpm 86 bpm -- 85 bpm   Oxygen Saturation (Admit) 95 % 96 % 98 % -- 98 %   Oxygen Saturation (Exercise) 95 % 96 % 98 % -- 96 %   Oxygen Saturation (Exit) 96 % 95 % 95 % -- 98 %   Rating of Perceived Exertion (Exercise) 13 13 12  -- 13   Perceived Dyspnea (Exercise) 2 2 3  -- 2   Duration Progress to 30 minutes of  aerobic without signs/symptoms of physical distress Continue with 30 min of aerobic exercise without signs/symptoms of physical distress. Continue with 30 min of aerobic exercise without signs/symptoms of physical distress. -- Continue with 30 min of aerobic exercise without signs/symptoms of physical distress.   Intensity THRR unchanged THRR unchanged THRR unchanged -- THRR unchanged     Resistance Training   Training Prescription Yes Yes Yes -- Yes   Weight red bands red bands red bands -- red bands   Reps 10-15 10-15 10-15 -- 10-15   Time 10 Minutes 10 Minutes 10 Minutes -- 10 Minutes     NuStep   Level 1 2 3  -- 3   SPM -- -- 86 -- 86   Minutes 15 15 15  -- 15    METs 2.6 1.9 3.1 -- 3     Track   Laps 8 8 10  -- 7   Minutes 15 15 15  -- 15   METs 2.23 2.23 2.54 -- 2.08     Home Exercise Plan   Plans to continue exercise at -- -- -- Home (comment) --   Frequency -- -- -- Add 2 additional days to program exercise sessions. --   Initial Home Exercises Provided -- -- -- 02/19/23 --    Row Name 03/10/23 1100             Response to Exercise   Blood Pressure (Admit) 124/68       Blood Pressure (Exercise) 140/60       Blood Pressure (Exit) 110/60       Heart Rate (Admit) 78 bpm       Heart Rate (Exercise) 94 bpm       Heart Rate (Exit) 86 bpm       Oxygen Saturation (Admit) 97 %       Oxygen Saturation (Exercise) 97 %       Oxygen Saturation (Exit) 97 %       Rating of Perceived Exertion (Exercise) 13       Perceived Dyspnea (Exercise) 2       Duration Continue with 30 min of aerobic exercise without signs/symptoms of physical distress.       Intensity THRR unchanged  Resistance Training   Training Prescription Yes       Weight red bands       Reps 10-15       Time 10 Minutes         NuStep   Level 3       SPM 81       Minutes 15       METs 3.2         Track   Laps 8       Minutes 15       METs 2.23                Exercise Comments:   Exercise Comments     Row Name 01/01/23 0948 02/19/23 1521         Exercise Comments Pt completed first day of exercise. Brenda-Lee exercised for 30 min on the Nustep. She averaged 2.2 METs at level 1 on the Nustep. Malayja performed the warmup and cooldown standing holding onto a chair for balance with verbal cues. Discussed METs and how to increase METs. Completed home ExRx. Lyzette is not consistently exercising at home. I discussed the difference between ADL's and exercise. Sandeep voiced understanding. I encouraged Hallelujah to start walking 3-5 days/wk for 30 min/day inside her home or outside on her driveway. She agreed with my recommendations. 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 This limits her time substantially. Will continue to check up on her home exercise.               Exercise Goals and Review:   Exercise Goals     Row Name 12/26/22 1038 01/07/23 0946           Exercise Goals   Increase Physical Activity Yes Yes      Intervention Provide advice, education, support and counseling about physical activity/exercise needs.;Develop an individualized exercise prescription for aerobic and resistive training based on initial evaluation findings, risk stratification, comorbidities and participant's personal goals. Provide advice, education, support and counseling about physical activity/exercise needs.;Develop an individualized exercise prescription for aerobic and resistive training based on initial evaluation findings, risk stratification, comorbidities and participant's personal goals.      Expected Outcomes Short Term: Attend rehab on a regular basis to increase amount of physical activity.;Long Term: Exercising regularly at least 3-5 days a week.;Long Term: Add in home exercise to make exercise part of routine and to increase amount of physical activity. Short Term: Attend rehab on a regular basis to increase amount of physical activity.;Long Term: Exercising regularly at least 3-5 days a week.;Long Term: Add in home exercise to make exercise part of routine and to increase amount of physical activity.      Increase Strength and Stamina Yes Yes      Intervention Provide advice, education, support and counseling about physical activity/exercise needs.;Develop an individualized exercise prescription for aerobic and resistive training based on initial evaluation findings, risk stratification, comorbidities and participant's personal goals. Provide advice, education, support and counseling about physical activity/exercise needs.;Develop an individualized exercise prescription for aerobic and resistive training  based on initial evaluation findings, risk stratification, comorbidities and participant's personal goals.      Expected Outcomes Short Term: Increase workloads from initial exercise prescription for resistance, speed, and METs.;Short Term: Perform resistance training exercises routinely during rehab and add in resistance training at home;Long Term: Improve cardiorespiratory fitness, muscular endurance and strength as measured by  increased METs and functional capacity ( ) Short Term: Increase workloads from initial exercise prescription for resistance, speed, and METs.;Short Term: Perform resistance training exercises routinely during rehab and add in resistance training at home;Long Term: Improve cardiorespiratory fitness, muscular endurance and strength as measured by increased METs and functional capacity ( )      Able to understand and use rate of perceived exertion (RPE) scale Yes Yes      Intervention Provide education and explanation on how to use RPE scale Provide education and explanation on how to use RPE scale      Expected Outcomes Short Term: Able to use RPE daily in rehab to express subjective intensity level;Long Term:  Able to use RPE to guide intensity level when exercising independently Short Term: Able to use RPE daily in rehab to express subjective intensity level;Long Term:  Able to use RPE to guide intensity level when exercising independently      Able to understand and use Dyspnea scale Yes Yes      Intervention Provide education and explanation on how to use Dyspnea scale Provide education and explanation on how to use Dyspnea scale      Expected Outcomes Short Term: Able to use Dyspnea scale daily in rehab to express subjective sense of shortness of breath during exertion;Long Term: Able to use Dyspnea scale to guide intensity level when exercising independently Short Term: Able to use Dyspnea scale daily in rehab to express subjective sense of shortness of breath during  exertion;Long Term: Able to use Dyspnea scale to guide intensity level when exercising independently      Knowledge and understanding of Target Heart Rate Range (THRR) Yes Yes      Intervention Provide education and explanation of THRR including how the numbers were predicted and where they are located for reference Provide education and explanation of THRR including how the numbers were predicted and where they are located for reference      Expected Outcomes Short Term: Able to state/look up THRR;Long Term: Able to use THRR to govern intensity when exercising independently;Short Term: Able to use daily as guideline for intensity in rehab Short Term: Able to state/look up THRR;Long Term: Able to use THRR to govern intensity when exercising independently;Short Term: Able to use daily as guideline for intensity in rehab      Understanding of Exercise Prescription Yes Yes      Intervention Provide education, explanation, and written materials on patient's individual exercise prescription Provide education, explanation, and written materials on patient's individual exercise prescription      Expected Outcomes Short Term: Able to explain program exercise prescription;Long Term: Able to explain home exercise prescription to exercise independently Short Term: Able to explain program exercise prescription;Long Term: Able to explain home exercise prescription to exercise independently               Exercise Goals Re-Evaluation :  Exercise Goals Re-Evaluation     Row Name 01/07/23 0946 02/04/23 0754 03/03/23 1617         Exercise Goal Re-Evaluation   Exercise Goals Review Increase Physical Activity;Able to understand and use Dyspnea scale;Understanding of Exercise Prescription;Increase Strength and Stamina;Knowledge and understanding of Target Heart Rate Range (THRR);Able to understand and use rate of perceived exertion (RPE) scale Increase Physical Activity;Able to understand and use Dyspnea  scale;Understanding of Exercise Prescription;Increase Strength and Stamina;Knowledge and understanding of Target Heart Rate Range (THRR);Able to understand and use rate of perceived exertion (RPE) scale Increase Physical Activity;Able to understand and  use Dyspnea scale;Understanding of Exercise Prescription;Increase Strength and Stamina;Knowledge and understanding of Target Heart Rate Range (THRR);Able to understand and use rate of perceived exertion (RPE) scale     Comments Cachet has completed 2 exercise sessions. She exercises for 30 min on the Nustep. Shiza averages 2.2 METs at level 1 on the Nustep. She performs the warmup and cooldown standing holding onto a chair with verbal cues. I am considering progressing her to 2x15 min exercise modes. Will discuss with pt next exercise session. It is too soon to note any discernable progressions. Will continue to monitor and progress as able. Mariselda has completed 10 exercise sessions. She exercises for 15 min on the Nustep and track. She averages 1.9 METs at level 2 on the Nustep and 2.23 METs on the track. Jonia performs the warmup and cooldown standing holding onto a chair for balance. She has progressed to walking the track for 15 min. This helps with her chronic hip pain. She tolerates the track well and takes rest breaks as needed. Will continue to monitor and progress as able. Rachael has completed 17 exercise sessions. She exercises for 15 min on the Nustep and track. She averages 3.3 METs at level 4 on the Nustep and 2.23 METs on the track. Tanea performs the warmup and cooldown standing holding onto a chair for balance. Esmae has increased her level on the Nustep as METs have also increased. She feels she has benefited from the exercise. We have recently discussed home exercise. I encouraged Terrianne to start walking at home. Will continue to monitor and progress as able.     Expected Outcomes Through exercise at rehab and home, the patient will  decrease shortness of breath with daily activities and feel confident in carrying out an exercise regimen at home. Through exercise at rehab and home, the patient will decrease shortness of breath with daily activities and feel confident in carrying out an exercise regimen at home. Through exercise at rehab and home, the patient will decrease shortness of breath with daily activities and feel confident in carrying out an exercise regimen at home.              Discharge Exercise Prescription (Final Exercise Prescription Changes):  Exercise Prescription Changes - 03/10/23 1100       Response to Exercise   Blood Pressure (Admit) 124/68    Blood Pressure (Exercise) 140/60    Blood Pressure (Exit) 110/60    Heart Rate (Admit) 78 bpm    Heart Rate (Exercise) 94 bpm    Heart Rate (Exit) 86 bpm    Oxygen Saturation (Admit) 97 %    Oxygen Saturation (Exercise) 97 %    Oxygen Saturation (Exit) 97 %    Rating of Perceived Exertion (Exercise) 13    Perceived Dyspnea (Exercise) 2    Duration Continue with 30 min of aerobic exercise without signs/symptoms of physical distress.    Intensity THRR unchanged      Resistance Training   Training Prescription Yes    Weight red bands    Reps 10-15    Time 10 Minutes      NuStep   Level 3    SPM 81    Minutes 15    METs 3.2      Track   Laps 8    Minutes 15    METs 2.23             Nutrition:  Target Goals: Understanding of nutrition guidelines, daily intake of  sodium 1500mg , cholesterol 200mg , calories 30% from fat and 7% or less from saturated fats, daily to have 5 or more servings of fruits and vegetables.  Biometrics:    Nutrition Therapy Plan and Nutrition Goals:  Nutrition Therapy & Goals - 03/03/23 1157       Nutrition Therapy   Diet General Healthy Diet    Drug/Food Interactions Statins/Certain Fruits      Personal Nutrition Goals   Nutrition Goal Patient to continue diet quality by using the plate method as a  guide for meal planning to include lean protein/plant protein, fruits, vegetables, whole grains, nonfat dairy as part of a well-balanced diet.   goal in progress.   Comments Goal in progess. Tewanna reports eating wide variety of foods focused on heart healthy cooking methods, fruit/vegetable intake, and lean protein sources. She does eat out on occasion and eats three meals daily. She is down 2.9# since orientation weight; weight remains appropriate for age. Her husband remains a good support. Patient will benefit from participation in pulmonary rehab for nutrition, exercise, and lifestyle modification.      Intervention Plan   Intervention Prescribe, educate and counsel regarding individualized specific dietary modifications aiming towards targeted core components such as weight, hypertension, lipid management, diabetes, heart failure and other comorbidities.;Nutrition handout(s) given to patient.    Expected Outcomes Short Term Goal: Understand basic principles of dietary content, such as calories, fat, sodium, cholesterol and nutrients.;Long Term Goal: Adherence to prescribed nutrition plan.             Nutrition Assessments:  Nutrition Assessments - 01/01/23 1500       Rate Your Plate Scores   Pre Score 50            MEDIFICTS Score Key: >=70 Need to make dietary changes  40-70 Heart Healthy Diet <= 40 Therapeutic Level Cholesterol Diet  Flowsheet Row PULMONARY REHAB OTHER RESPIRATORY from 01/01/2023 in [facility] for Heart, Vascular, & Lung Health  Picture Your Plate Total Score on Admission 50      Picture Your Plate Scores: <62 Unhealthy dietary pattern with much room for improvement. 41-50 Dietary pattern unlikely to meet recommendations for good health and room for improvement. 51-60 More healthful dietary pattern, with some room for improvement.  >60 Healthy dietary pattern, although there may be some specific behaviors that could be improved.     Nutrition Goals Re-Evaluation:  Nutrition Goals Re-Evaluation     Row Name 01/01/23 1340 02/03/23 1514 03/03/23 1157         Goals   Current Weight 138 lb 14.2 oz (63 kg) 139 lb 1.8 oz (63.1 kg) 141 lb 15.6 oz (64.4 kg)     Comment Cr 1.02, GFR 54 no new labs; most recent labs Cr 1.02, GFR 54 no new labs; most recent labs Cr 1.02, GFR 54     Expected Outcome Goal in progess. Britten reports eating wide variety of foods focused on heart healthy cooking methods, fruit/vegetable intake, and lean protein sources. She does eat out on occasion. Her husband remains a good support. Patient will benefit from participation in pulmonary rehab for nutrition, exercise, and lifestyle modification. Goal in progess. Jazabella reports eating wide variety of foods focused on heart healthy cooking methods, fruit/vegetable intake, and lean protein sources. She does eat out on occasion. She is down 5.7# since orientation weight; however, she has maintained her weight over the last 4 weeks. Her husband remains a good support. Patient will benefit  from participation in pulmonary rehab for nutrition, exercise, and lifestyle modification. Goal in progess. Vernica reports eating wide variety of foods focused on heart healthy cooking methods, fruit/vegetable intake, and lean protein sources. She does eat out on occasion and eats three meals daily. She is down 2.9# since orientation weight; weight remains appropriate for age. Her husband remains a good support. Patient will benefit from participation in pulmonary rehab for nutrition, exercise, and lifestyle modification.              Nutrition Goals Discharge (Final Nutrition Goals Re-Evaluation):  Nutrition Goals Re-Evaluation - 03/03/23 1157       Goals   Current Weight 141 lb 15.6 oz (64.4 kg)    Comment no new labs; most recent labs Cr 1.02, GFR 54    Expected Outcome Goal in progess. Xotchil reports eating wide variety of foods focused on heart healthy cooking  methods, fruit/vegetable intake, and lean protein sources. She does eat out on occasion and eats three meals daily. She is down 2.9# since orientation weight; weight remains appropriate for age. Her husband remains a good support. Patient will benefit from participation in pulmonary rehab for nutrition, exercise, and lifestyle modification.             Psychosocial: Target Goals: Acknowledge presence or absence of significant depression and/or stress, maximize coping skills, provide positive support system. Participant is able to verbalize types and ability to use techniques and skills needed for reducing stress and depression.  Initial Review & Psychosocial Screening:  Initial Psych Review & Screening - 12/26/22 0925       Initial Review   Current issues with None Identified      Family Dynamics   Good Support System? 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 She denies any psychosocial barriers to rehab      Barriers   Psychosocial barriers to participate in program There are no identifiable barriers or psychosocial needs.             Quality of Life Scores:  Scores of 19 and below usually indicate a poorer quality of life in these areas.  A difference of  2-3 points is a clinically meaningful difference.  A difference of 2-3 points in the total score of the Quality of Life Index has been associated with significant improvement in overall quality of life, self-image, physical symptoms, and general health in studies assessing change in quality of life.  PHQ-9: Review Flowsheet       12/26/2022 01/01/2015  Depression screen PHQ 2/9  Decreased Interest 0 0  Down, Depressed, Hopeless 0 0  PHQ - 2 Score 0 0  Altered sleeping 3 -  Tired, decreased energy 1 -  Change in appetite 0 -  Feeling bad or failure about yourself  0 -  Trouble concentrating 0 -  Moving slowly or fidgety/restless 0 -  Suicidal thoughts 0 -  PHQ-9 Score 4 -  Difficult doing work/chores  Somewhat difficult -    Details           Interpretation of Total Score  Total Score Depression Severity:  1-4 = Minimal depression, 5-9 = Mild depression, 10-14 = Moderate depression, 15-19 = Moderately severe depression, 20-27 = Severe depression   Psychosocial Evaluation and Intervention:  Psychosocial Evaluation - 12/26/22 0926       Psychosocial Evaluation & Interventions   Interventions Encouraged to exercise with the program and follow exercise prescription    Comments Jola Babinski  denies any psychosocial barriers to rehab    Expected Outcomes For Rozalyn to participate in rehab free of psychosocial concerns.    Continue Psychosocial Services  No Follow up required             Psychosocial Re-Evaluation:  Psychosocial Re-Evaluation     Row Name 01/05/23 1350 02/02/23 1545 03/04/23 1430         Psychosocial Re-Evaluation   Current issues with None Identified Current Stress Concerns Current Stress Concerns     Comments No changes since starting the program. Patrick has attended 1 class so far. Burdell has stated to staff that she is dealing with a great deal of stress currently. Her 18 year old sister lives in an assisted living facility. Due to this Juliana has been tasked with cleaning out her sisters house. This has brought on a great deal of stress for Elliemae. Staff has encouraged Caitlan to listen to her body and rest when needed. Staff has also educated Malavika on healthy ways to deal with stress. Starlette states that her stress is under control at this time. She has finished cleaning her sister's house and this has lifted alot of Makaleigh's stress. She decllines any needs at this time.     Expected Outcomes For patient to attend PR without any psychosocial barriers or concerns For patient to attend PR without any psychosocial barriers or concerns For patient to attend PR without any psychosocial barriers or concerns     Interventions Encouraged to attend Pulmonary  Rehabilitation for the exercise Encouraged to attend Pulmonary Rehabilitation for the exercise;Stress management education Encouraged to attend Pulmonary Rehabilitation for the exercise     Continue Psychosocial Services  No Follow up required Follow up required by staff No Follow up required              Psychosocial Discharge (Final Psychosocial Re-Evaluation):  Psychosocial Re-Evaluation - 03/04/23 1430       Psychosocial Re-Evaluation   Current issues with Current Stress Concerns    Comments Teres states that her stress is under control at this time. She has finished cleaning her sister's house and this has lifted alot of Keatyn's stress. She decllines any needs at this time.    Expected Outcomes For patient to attend PR without any psychosocial barriers or concerns    Interventions Encouraged to attend Pulmonary Rehabilitation for the exercise    Continue Psychosocial Services  No Follow up required             Education: Education Goals: Education classes will be provided on a weekly basis, covering required topics. Participant will state understanding/return demonstration of topics presented.  Learning Barriers/Preferences:  Learning Barriers/Preferences - 12/26/22 8657       Learning Barriers/Preferences   Learning Barriers Sight    Learning Preferences Skilled Demonstration;Video;Pictoral             Education Topics: Know Your Numbers Group instruction that is supported by a PowerPoint presentation. Instructor discusses importance of knowing and understanding resting, exercise, and post-exercise oxygen saturation, heart rate, and blood pressure. Oxygen saturation, heart rate, blood pressure, rating of perceived exertion, and dyspnea are reviewed along with a normal range for these values.  Flowsheet Row PULMONARY REHAB OTHER RESPIRATORY from 02/12/2023 in [facility] for Heart, Vascular, & Lung Health  Date 02/12/23  Educator EP   Instruction Review Code 1- Verbalizes Understanding       Exercise for the Pulmonary Patient Group instruction that is supported  by a PowerPoint presentation. Instructor discusses benefits of exercise, core components of exercise, frequency, duration, and intensity of an exercise routine, importance of utilizing pulse oximetry during exercise, safety while exercising, and options of places to exercise outside of rehab.  Flowsheet Row PULMONARY REHAB OTHER RESPIRATORY from 02/05/2023 in [facility] for Heart, Vascular, & Lung Health  Date 02/05/23  Educator EP  Instruction Review Code 1- Verbalizes Understanding       MET Level  Group instruction provided by PowerPoint, verbal discussion, and written material to support subject matter. Instructor reviews what METs are and how to increase METs.  Flowsheet Row PULMONARY REHAB OTHER RESPIRATORY from 01/08/2023 in [facility] for Heart, Vascular, & Lung Health  Date 01/08/23  Educator EP  Instruction Review Code 1- Verbalizes Understanding       Pulmonary Medications Verbally interactive group education provided by instructor with focus on inhaled medications and proper administration. Flowsheet Row PULMONARY REHAB OTHER RESPIRATORY from 01/29/2023 in [facility] for Heart, Vascular, & Lung Health  Date 01/29/23  Educator RT  Instruction Review Code 1- Verbalizes Understanding       Anatomy and Physiology of the Respiratory System Group instruction provided by PowerPoint, verbal discussion, and written material to support subject matter. Instructor reviews respiratory cycle and anatomical components of the respiratory system and their functions. Instructor also reviews differences in obstructive and restrictive respiratory diseases with examples of each.  Flowsheet Row PULMONARY REHAB OTHER RESPIRATORY from 01/22/2023 in [facility] for  Heart, Vascular, & Lung Health  Date 01/22/23  Educator RT  Instruction Review Code 1- Verbalizes Understanding       Oxygen Safety Group instruction provided by PowerPoint, verbal discussion, and written material to support subject matter. There is an overview of "What is Oxygen" and "Why do we need it".  Instructor also reviews how to create a safe environment for oxygen use, the importance of using oxygen as prescribed, and the risks of noncompliance. There is a brief discussion on traveling with oxygen and resources the patient may utilize. Flowsheet Row PULMONARY REHAB OTHER RESPIRATORY from 02/19/2023 in [facility] for Heart, Vascular, & Lung Health  Date 02/19/23  Educator RN  Instruction Review Code 1- Verbalizes Understanding       Oxygen Use Group instruction provided by PowerPoint, verbal discussion, and written material to discuss how supplemental oxygen is prescribed and different types of oxygen supply systems. Resources for more information are provided.    Breathing Techniques Group instruction that is supported by demonstration and informational handouts. Instructor discusses the benefits of pursed lip and diaphragmatic breathing and detailed demonstration on how to perform both.  Flowsheet Row PULMONARY REHAB OTHER RESPIRATORY from 03/05/2023 in [facility] for Heart, Vascular, & Lung Health  Date 03/05/23  Educator RN  Instruction Review Code 1- Verbalizes Understanding        Risk Factor Reduction Group instruction that is supported by a PowerPoint presentation. Instructor discusses the definition of a risk factor, different risk factors for pulmonary disease, and how the heart and lungs work together. Flowsheet Row PULMONARY REHAB OTHER RESPIRATORY from 01/01/2023 in [facility] for Heart, Vascular, & Lung Health  Date 01/01/23  Educator Ep  Instruction Review Code 1- Verbalizes  Understanding       Pulmonary Diseases Group instruction provided by PowerPoint, verbal discussion, and written material to support subject matter. Instructor gives  an overview of the different type of pulmonary diseases. There is also a discussion on risk factors and symptoms as well as ways to manage the diseases.   Stress and Energy Conservation Group instruction provided by PowerPoint, verbal discussion, and written material to support subject matter. Instructor gives an overview of stress and the impact it can have on the body. Instructor also reviews ways to reduce stress. There is also a discussion on energy conservation and ways to conserve energy throughout the day.   Warning Signs and Symptoms Group instruction provided by PowerPoint, verbal discussion, and written material to support subject matter. Instructor reviews warning signs and symptoms of stroke, heart attack, cold and flu. Instructor also reviews ways to prevent the spread of infection.   Other Education Group or individual verbal, written, or video instructions that support the educational goals of the pulmonary rehab program. Flowsheet Row PULMONARY REHAB OTHER RESPIRATORY from 01/15/2023 in [facility] for Heart, Vascular, & Lung Health  Date 01/15/23  Educator RT  Instruction Review Code 1- Verbalizes Understanding        Knowledge Questionnaire Score:  Knowledge Questionnaire Score - 12/26/22 1036       Knowledge Questionnaire Score   Pre Score 16/18             Core Components/Risk Factors/Patient Goals at Admission:  Personal Goals and Risk Factors at Admission - 12/26/22 0927       Core Components/Risk Factors/Patient Goals on Admission    Weight Management Weight Loss;Yes    Intervention Weight Management: Develop a combined nutrition and exercise program designed to reach desired caloric intake, while maintaining appropriate intake of nutrient and fiber, sodium and  fats, and appropriate energy expenditure required for the weight goal.;Weight Management: Provide education and appropriate resources to help participant work on and attain dietary goals.;Weight Management/Obesity: Establish reasonable short term and long term weight goals.;Obesity: Provide education and appropriate resources to help participant work on and attain dietary goals.    Expected Outcomes Short Term: Continue to assess and modify interventions until short term weight is achieved;Long Term: Adherence to nutrition and physical activity/exercise program aimed toward attainment of established weight goal;Weight Maintenance: Understanding of the daily nutrition guidelines, which includes 25-35% calories from fat, 7% or less cal from saturated fats, less than 200mg  cholesterol, less than 1.5gm of sodium, & 5 or more servings of fruits and vegetables daily;Weight Loss: Understanding of general recommendations for a balanced deficit meal plan, which promotes 1-2 lb weight loss per week and includes a negative energy balance of [phone] kcal/d;Understanding recommendations for meals to include 15-35% energy as protein, 25-35% energy from fat, 35-60% energy from carbohydrates, less than 200mg  of dietary cholesterol, 20-35 gm of total fiber daily;Understanding of distribution of calorie intake throughout the day with the consumption of 4-5 meals/snacks;Weight Gain: Understanding of general recommendations for a high calorie, high protein meal plan that promotes weight gain by distributing calorie intake throughout the day with the consumption for 4-5 meals, snacks, and/or supplements    Improve shortness of breath with ADL's Yes    Intervention Provide education, individualized exercise plan and daily activity instruction to help decrease symptoms of SOB with activities of daily living.    Expected Outcomes Short Term: Improve cardiorespiratory fitness to achieve a reduction of symptoms when performing ADLs;Long  Term: Be able to perform more ADLs without symptoms or delay the onset of symptoms    Heart Failure Yes    Intervention Provide a combined  exercise and nutrition program that is supplemented with education, support and counseling about heart failure. Directed toward relieving symptoms such as shortness of breath, decreased exercise tolerance, and extremity edema.    Expected Outcomes Improve functional capacity of life;Short term: Attendance in program 2-3 days a week with increased exercise capacity. Reported lower sodium intake. Reported increased fruit and vegetable intake. Reports medication compliance.;Short term: Daily weights obtained and reported for increase. Utilizing diuretic protocols set by physician.;Long term: Adoption of self-care skills and reduction of barriers for early signs and symptoms recognition and intervention leading to self-care maintenance.             Core Components/Risk Factors/Patient Goals Review:   Goals and Risk Factor Review     Row Name 01/05/23 1351 02/02/23 1553 03/04/23 1432         Core Components/Risk Factors/Patient Goals Review   Personal Goals Review Weight Management/Obesity;Heart Failure;Improve shortness of breath with ADL's;Develop more efficient breathing techniques such as purse lipped breathing and diaphragmatic breathing and practicing self-pacing with activity. Weight Management/Obesity;Improve shortness of breath with ADL's;Heart Failure Weight Management/Obesity;Improve shortness of breath with ADL's;Heart Failure     Review Unable to evaluate progress yet. Merika has attended 1 class so far. Goal progressing for weight loss. Ryatt has been working with our dietician for weight loss goals. Goal progressing on improving her shortness of breath with ADLs. Goal met on developing more efficient breathing techniques such as purse lipped breathing and diaphragmatic breathing; and practicing self-pacing with activity. Timmia is able to  demonstrtae purse lip breathing when she gets short of breath. She also knows how to pace herself to help with shortness of breath.  Goal progressing on heart failure. We will continue to monitor her progress throughout the program. Goal progressing for weight loss. Shadell is down 2.9#'s since starting the program. She continues to work with the staff dietician to achieve her weight loss goals. Goal progressing on improving her shortness of breath with ADLs. She has maintained O2 sats >88% on RA while exercising. Goal met on heart failure. No CHF exacerbations since she has been in [facility].  We will continue to monitor her progress throughout the program.     Expected Outcomes For Mailyn to lose weight, improve her shortness of breath with ADLs, develop more efficient breathing techniques, and have no HF exacerbations during her time in [facility] See admission goals For Evelean to lose weight and improve her shortness of breath with ADL's              Core Components/Risk Factors/Patient Goals at Discharge (Final Review):   Goals and Risk Factor Review - 03/04/23 1432       Core Components/Risk Factors/Patient Goals Review   Personal Goals Review Weight Management/Obesity;Improve shortness of breath with ADL's;Heart Failure    Review Goal progressing for weight loss. Leamsi is down 2.9#'s since starting the program. She continues to work with the staff dietician to achieve her weight loss goals. Goal progressing on improving her shortness of breath with ADLs. She has maintained O2 sats >88% on RA while exercising. Goal met on heart failure. No CHF exacerbations since she has been in [facility].  We will continue to monitor her progress throughout the program.    Expected Outcomes For Cheronda to lose weight and improve her shortness of breath with ADL's             ITP Comments: Pt is making expected progress toward Pulmonary Rehab goals after completing 19 session(s).  Recommend continued exercise, life style  modification, education, and utilization of breathing techniques to increase stamina and strength, while also decreasing shortness of breath with exertion.  Dr. Mechele Collin is Medical Director for Pulmonary Rehab at [facility].

## 2023-03-12 ENCOUNTER — Encounter (HOSPITAL_COMMUNITY)
Admission: RE | Admit: 2023-03-12 | Discharge: 2023-03-12 | Disposition: A | Discharge status: Home / Self Care | Payer: Medicare Other | Source: Ambulatory Visit | Attending: Internal Medicine | Admitting: Internal Medicine

## 2023-03-12 DIAGNOSIS — J4550 Severe persistent asthma, uncomplicated: Secondary | ICD-10-CM

## 2023-03-12 NOTE — Progress Notes (Signed)
Daily Session Note  Patient Details  Name: [name] MRN: [id number] Date of Birth: 1932/09/03 Referring Provider:   Doristine Devoid Pulmonary Rehab Walk Test from 12/26/2022 in [facility] for Heart, Vascular, & Lung Health  Referring Provider Ramawamy       Encounter Date: 03/12/2023  Check In:  Session Check In - 03/12/23 1046       Check-In   Supervising physician immediately available to respond to emergencies [facility] MD immediately available    Physician(s) Carlyon Shadow, NP    Location [facility]    Staff Present Durel Salts, Patriciaann Clan, RN, BSN;Samantha Belarus, RD, Rexene Agent, MS, ACSM-CEP, Exercise Physiologist;Annedrea Remus Loffler, RN, Lottie Rater, MS, ACSM-CEP, CCRP, Exercise Physiologist    Virtual Visit No    Medication changes reported     No    Fall or balance concerns reported    No    Tobacco Cessation No Change    Warm-up and Cool-down Performed as group-led instruction    Resistance Training Performed Yes    VAD Patient? No    PAD/SET Patient? No      Pain Assessment   Currently in Pain? No/denies    Multiple Pain Sites No             Capillary Blood Glucose: No results found. However, due to the size of the patient record, not all encounters were searched. Please check Results Review for a complete set of results.    Social History   Tobacco Use  Smoking Status Never  Smokeless Tobacco Never    Goals Met:  Proper associated with RPD/PD & O2 Sat Independence with exercise equipment Exercise tolerated well No report of concerns or symptoms today Strength training completed today  Goals Unmet:  Not Applicable  Comments: Service time is from 1011 to 1135.    Dr. Mechele Collin is Medical Director for Pulmonary Rehab at [facility].

## 2023-03-17 ENCOUNTER — Encounter (HOSPITAL_COMMUNITY)
Admission: RE | Admit: 2023-03-17 | Discharge: 2023-03-17 | Disposition: A | Discharge status: Home / Self Care | Payer: Medicare Other | Source: Ambulatory Visit | Attending: Internal Medicine | Admitting: Internal Medicine

## 2023-03-17 DIAGNOSIS — J4550 Severe persistent asthma, uncomplicated: Secondary | ICD-10-CM | POA: Diagnosis present

## 2023-03-19 ENCOUNTER — Encounter (HOSPITAL_COMMUNITY)
Admission: RE | Admit: 2023-03-19 | Discharge: 2023-03-19 | Disposition: A | Discharge status: Home / Self Care | Payer: Medicare Other | Source: Ambulatory Visit | Attending: Internal Medicine | Admitting: Internal Medicine

## 2023-03-19 DIAGNOSIS — J4550 Severe persistent asthma, uncomplicated: Secondary | ICD-10-CM | POA: Diagnosis not present

## 2023-03-19 NOTE — Progress Notes (Signed)
Daily Session Note  Patient Details  Name: [name] MRN: [id number] Date of Birth: 08-31-1932 Referring Provider:   Doristine Devoid Pulmonary Rehab Walk Test from 12/26/2022 in [facility] for Heart, Vascular, & Lung Health  Referring Provider Ramawamy       Encounter Date: 03/19/2023  Check In:  Session Check In - 03/19/23 0803       Check-In   Supervising physician immediately available to respond to emergencies [facility] immediately available    Physician(s) Robin Searing, NP    [facility]    Staff Present Durel Salts, Patriciaann Clan, RN, Doris Cheadle, MS, ACSM-CEP, Exercise Physiologist;Randi Dionisio Paschal, ACSM-CEP, Exercise Physiologist    Virtual Visit No    Medication changes reported     No    Fall or balance concerns reported    No    Tobacco Cessation No Change    Warm-up and Cool-down Performed as group-led instruction    Resistance Training Performed Yes    VAD Patient? No    PAD/SET Patient? No      Pain Assessment   Currently in Pain? No/denies    Multiple Pain Sites No             Capillary Blood Glucose: No results found. However, due to the size of the patient record, not all encounters were searched. Please check Results Review for a complete set of results.    Social History   Tobacco Use  Smoking Status Never  Smokeless Tobacco Never    Goals Met:  Proper associated with RPD/PD & O2 Sat Independence with exercise equipment Exercise tolerated well No report of concerns or symptoms today Strength training completed today  Goals Unmet:  Not Applicable  Comments: Service time is from 0800 to 0923.    Dr. Mechele Collin is Medical Director for Pulmonary Rehab at [facility].

## 2023-03-24 ENCOUNTER — Encounter (HOSPITAL_COMMUNITY): Payer: Self-pay

## 2023-03-24 ENCOUNTER — Encounter (HOSPITAL_COMMUNITY)
Admission: RE | Admit: 2023-03-24 | Discharge: 2023-03-24 | Disposition: A | Discharge status: Home / Self Care | Payer: Medicare Other | Source: Ambulatory Visit | Attending: Internal Medicine

## 2023-03-24 VITALS — Wt 144.0 lb

## 2023-03-24 DIAGNOSIS — J4550 Severe persistent asthma, uncomplicated: Secondary | ICD-10-CM | POA: Diagnosis not present

## 2023-03-24 NOTE — Progress Notes (Signed)
Daily Session Note  Patient Details  Name: Hawa Dicecco MRN: [id number] Date of Birth: [birth year] Referring Provider:   Doristine Devoid Pulmonary Rehab Walk Test from 12/26/2022 in [facility] for Heart, Vascular, & Lung Health  Referring Provider Ramawamy       Encounter Date: 03/24/2023  Check In:  Session Check In - 03/24/23 1029       Check-In   Supervising physician immediately available to respond to emergencies [facility] immediately available    Physician(s) Lorin Picket, NP    Location [facility]    Staff Present Durel Salts, Patriciaann Clan, RN, Doris Cheadle, MS, ACSM-CEP, Exercise Physiologist;Randi Idelle Crouch BS, ACSM-CEP, Exercise Physiologist;Samantha Belarus, RD, LDN    Virtual Visit No    Medication changes reported     No    Fall or balance concerns reported    No    Tobacco Cessation No Change    Warm-up and Cool-down Performed as group-led instruction    Resistance Training Performed Yes    VAD Patient? No    PAD/SET Patient? No      Pain Assessment   Currently in Pain? No/denies    Multiple Pain Sites No             Capillary Blood Glucose: No results found. However, due to the size of the patient record, not all encounters were searched. Please check Results Review for a complete set of results.   Exercise Prescription Changes - 03/24/23 1200       Response to Exercise   Blood Pressure (Admit) 132/70    Blood Pressure (Exercise) 172/82    Blood Pressure (Exit) 120/60    Heart Rate (Admit) 75 bpm    Heart Rate (Exercise) 103 bpm    Heart Rate (Exit) 82 bpm    Oxygen Saturation (Admit) 100 %    Oxygen Saturation (Exercise) 103 %    Oxygen Saturation (Exit) 98 %    Rating of Perceived Exertion (Exercise) 13    Perceived Dyspnea (Exercise) 1    Duration Continue with 30 min of aerobic exercise without signs/symptoms of physical distress.    Intensity THRR unchanged      Resistance Training   Training  Prescription Yes    Weight red bands    Reps 10-15    Time 10 Minutes      NuStep   Level 4    SPM 86    Minutes 15    METs 3.5      Track   Laps 9    Minutes 15    METs 2.38             Social History   Tobacco Use  Smoking Status Never  Smokeless Tobacco Never    Goals Met:  Independence with exercise equipment Exercise tolerated well No report of concerns or symptoms today Strength training completed today  Goals Unmet:  Not Applicable  Comments: Service time is from 1008 to 1130    Dr. Mechele Collin is Medical Director for Pulmonary Rehab at [facility].

## 2023-03-26 ENCOUNTER — Encounter (HOSPITAL_COMMUNITY)
Admission: RE | Admit: 2023-03-26 | Discharge: 2023-03-26 | Disposition: A | Discharge status: Home / Self Care | Payer: Medicare Other | Source: Ambulatory Visit | Attending: Internal Medicine | Admitting: Internal Medicine

## 2023-03-26 DIAGNOSIS — J4550 Severe persistent asthma, uncomplicated: Secondary | ICD-10-CM

## 2023-03-26 NOTE — Progress Notes (Signed)
Daily Session Note  Patient Details  Name: [name] MRN: [id number] Date of Birth: September 07, 1932 Referring Provider:   Doristine Devoid Pulmonary Rehab Walk Test from 12/26/2022 in [facility] for Heart, Vascular, & Lung Health  Referring Provider Ramawamy       Encounter Date: 03/26/2023  Check In:  Session Check In - 03/26/23 1042       Check-In   Supervising physician immediately available to respond to emergencies [facility] immediately available    Physician(s) Bernadene Person, NP    [facility]    Staff Present Durel Salts, Patriciaann Clan, RN, Doris Cheadle, MS, ACSM-CEP, Exercise Physiologist;Randi Idelle Crouch BS, ACSM-CEP, Exercise Physiologist    Virtual Visit No    Medication changes reported     No    Fall or balance concerns reported    No    Tobacco Cessation No Change    Warm-up and Cool-down Performed as group-led instruction    Resistance Training Performed Yes    VAD Patient? No    PAD/SET Patient? No      Pain Assessment   Currently in Pain? No/denies    Multiple Pain Sites No             Capillary Blood Glucose: No results found. However, due to the size of the patient record, not all encounters were searched. Please check Results Review for a complete set of results.    Social History   Tobacco Use  Smoking Status Never  Smokeless Tobacco Never    Goals Met:  Proper associated with RPD/PD & O2 Sat Independence with exercise equipment Exercise tolerated well No report of concerns or symptoms today Strength training completed today  Goals Unmet:  Not Applicable  Comments: Service time is from 1006 to 1133.    Dr. Mechele Collin is Medical Director for Pulmonary Rehab at [facility].

## 2023-03-31 ENCOUNTER — Encounter (HOSPITAL_COMMUNITY)
Admission: RE | Admit: 2023-03-31 | Discharge: 2023-03-31 | Disposition: A | Discharge status: Home / Self Care | Payer: Medicare Other | Source: Ambulatory Visit | Attending: Internal Medicine

## 2023-03-31 DIAGNOSIS — J4550 Severe persistent asthma, uncomplicated: Secondary | ICD-10-CM

## 2023-03-31 NOTE — Progress Notes (Signed)
Daily Session Note  Patient Details  Name: [name] MRN: [id number] Date of Birth: 1933/04/18 Referring Provider:   Doristine Devoid Pulmonary Rehab Walk Test from 12/26/2022 in [facility] for Heart, Vascular, & Lung Health  Referring Provider Ramawamy       Encounter Date: 03/31/2023  Check In:  Session Check In - 03/31/23 1106       Check-In   Supervising physician immediately available to respond to emergencies [facility] immediately available    Physician(s) Bernadene Person, NP    [facility]    Staff Present Durel Salts, Patriciaann Clan, RN, Doris Cheadle, MS, ACSM-CEP, Exercise Physiologist;Randi Idelle Crouch BS, ACSM-CEP, Exercise Physiologist    Virtual Visit No    Medication changes reported     No    Fall or balance concerns reported    No    Tobacco Cessation No Change    Warm-up and Cool-down Performed as group-led instruction    Resistance Training Performed Yes    VAD Patient? No    PAD/SET Patient? No      Pain Assessment   Currently in Pain? No/denies    Multiple Pain Sites No             Capillary Blood Glucose: No results found. However, due to the size of the patient record, not all encounters were searched. Please check Results Review for a complete set of results.    Social History   Tobacco Use  Smoking Status Never  Smokeless Tobacco Never    Goals Met:  Proper associated with RPD/PD & O2 Sat Independence with exercise equipment Exercise tolerated well No report of concerns or symptoms today Strength training completed today  Goals Unmet:  Not Applicable  Comments: Service time is from 1008 to 1131.    Dr. Mechele Collin is Medical Director for Pulmonary Rehab at [facility]

## 2023-04-02 ENCOUNTER — Encounter (HOSPITAL_COMMUNITY)
Admission: RE | Admit: 2023-04-02 | Discharge: 2023-04-02 | Disposition: A | Discharge status: Home / Self Care | Payer: Medicare Other | Source: Ambulatory Visit | Attending: Internal Medicine | Admitting: Internal Medicine

## 2023-04-02 DIAGNOSIS — J4550 Severe persistent asthma, uncomplicated: Secondary | ICD-10-CM

## 2023-04-02 NOTE — Progress Notes (Signed)
Incomplete Session Note Pulmonary Rehab  Patient Details  Name: [name] MRN: [id number] Date of Birth: 06-21-1932 Referring Provider:   Doristine Devoid Pulmonary Rehab Walk Test from 12/26/2022 in [facility] for Heart, Vascular, & Lung Health  Referring Provider [facility] Batz did not complete her rehab session.  Tzivia's BP was 186/74 pre-exercise. Per department policy, unable to exercise due to exceeding parameters. Pt stated she hasn't taken all meds this AM. Advised pt to go home and take AM meds. Pt understands without assistance and acknowledges recommendations. Pt educated when to call EMS/go to ED.

## 2023-04-07 ENCOUNTER — Encounter (HOSPITAL_COMMUNITY)
Admission: RE | Admit: 2023-04-07 | Discharge: 2023-04-07 | Disposition: A | Discharge status: Home / Self Care | Payer: Medicare Other | Source: Ambulatory Visit | Attending: Internal Medicine | Admitting: Internal Medicine

## 2023-04-07 VITALS — Wt 142.6 lb

## 2023-04-07 DIAGNOSIS — J4550 Severe persistent asthma, uncomplicated: Secondary | ICD-10-CM

## 2023-04-07 NOTE — Progress Notes (Signed)
Daily Session Note  Patient Details  Name: [name] MRN: [id number] Date of Birth: 1933/02/10 Referring Provider:   Doristine Devoid Pulmonary Rehab Walk Test from 12/26/2022 in [facility] for Heart, Vascular, & Lung Health  Referring Provider Ramawamy       Encounter Date: 04/07/2023  Check In:  Session Check In - 04/07/23 0817       Check-In   Supervising physician immediately available to respond to emergencies [facility] MD immediately available    Physician(s) Jari Favre, NP    [facility]    Staff Present Durel Salts, Patriciaann Clan, RN, Doris Cheadle, MS, ACSM-CEP, Exercise Physiologist;Randi Idelle Crouch BS, ACSM-CEP, Exercise Physiologist    Virtual Visit No    Medication changes reported     No    Fall or balance concerns reported    No    Tobacco Cessation No Change    Warm-up and Cool-down Performed as group-led instruction    Resistance Training Performed Yes    VAD Patient? No    PAD/SET Patient? No      Pain Assessment   Currently in Pain? No/denies    Multiple Pain Sites No             Capillary Blood Glucose: No results found. However, due to the size of the patient record, not all encounters were searched. Please check Results Review for a complete set of results.   Exercise Prescription Changes - 04/07/23 0900       Response to Exercise   Blood Pressure (Admit) 142/70    Blood Pressure (Exercise) 190/70    Blood Pressure (Exit) 138/70    Heart Rate (Admit) 79 bpm    Heart Rate (Exercise) 96 bpm    Heart Rate (Exit) 88 bpm    Oxygen Saturation (Admit) 97 %    Oxygen Saturation (Exercise) 96 %    Oxygen Saturation (Exit) 97 %    Rating of Perceived Exertion (Exercise) 13    Perceived Dyspnea (Exercise) 2    Duration Continue with 30 min of aerobic exercise without signs/symptoms of physical distress.    Intensity THRR unchanged      Resistance Training   Training Prescription Yes    Weight red  bands    Reps 10-15    Time 10 Minutes      NuStep   Level 4    SPM 79    Minutes 8    METs 3.2      Track   Laps 12    Minutes 20    METs 2.38             Social History   Tobacco Use  Smoking Status Never  Smokeless Tobacco Never    Goals Met:  Proper associated with RPD/PD & O2 Sat Independence with exercise equipment Exercise tolerated well No report of concerns or symptoms today Strength training completed today  Goals Unmet:  Not Applicable  Comments: Service time is from 0800 to 0920.    Dr. Mechele Collin is Medical Director for Pulmonary Rehab at [facility].

## 2023-04-08 NOTE — Progress Notes (Signed)
Pulmonary Individual Treatment Plan  Patient Details  Name: [name] MRN: [id number] Date of Birth: 10/18/1932 Referring Provider:   Doristine Devoid Pulmonary Rehab Walk Test from 12/26/2022 in [facility]  Referring Provider Ramawamy       Initial Encounter Date:  Flowsheet Row Pulmonary Rehab Walk Test from 12/26/2022 in [facility]  Date 12/26/22       Visit Diagnosis: Severe persistent asthma without complication  Patient's Home Medications on Admission:   Current Outpatient Medications:    Abaloparatide (TYMLOS) 3120 MCG/1.56ML SOPN, Inject Subcutaneous each morning, Disp: , Rfl:    albuterol (PROVENTIL) (2.5 MG/3ML) 0.083% nebulizer solution, Take 3 mLs (2.5 mg total) by nebulization every 6 (six) hours as needed for wheezing or shortness of breath., Disp: 75 mL, Rfl: 12   albuterol (VENTOLIN HFA) 108 (90 Base) MCG/ACT inhaler, TAKE 2 PUFFS BY MOUTH EVERY 6 HOURS AS NEEDED FOR WHEEZE OR SHORTNESS OF BREATH, Disp: 8.5 each, Rfl: 5   budesonide-formoterol (SYMBICORT) 160-4.5 MCG/ACT inhaler, Inhale 2 puffs into the lungs in the morning and at bedtime., Disp: 10.2 g, Rfl: 5   Calcium Carbonate-Vitamin D 600-400 MG-UNIT tablet, Take 1 tablet by mouth daily., Disp: , Rfl:    chlorpheniramine (CHLOR-TRIMETON) 4 MG tablet, Take 4-8 mg by mouth See admin instructions. 4 mg in the morning and 8 mg at bedtime, Disp: , Rfl:    Cholecalciferol (VITAMIN D3) 1000 UNITS tablet, Take 1,000 Units by mouth every evening. , Disp: , Rfl:    dextromethorphan (DELSYM) 30 MG/5ML liquid, Take 60 mg by mouth 2 (two) times daily as needed for cough., Disp: , Rfl:    docusate sodium (COLACE) 100 MG capsule, Take 1 capsule (100 mg total) by mouth 2 (two) times daily., Disp: 10 capsule, Rfl: 0   dupilumab (DUPIXENT) 200 MG/1. prefilled syringe, Inject 200 mg into the skin every  14 (fourteen) days., Disp: 6.84 mL, Rfl: 2   fexofenadine (ALLEGRA) 180 MG tablet, Take 180 mg by mouth daily.  , Disp: , Rfl:    furosemide (LASIX) 20 MG tablet, TAKE 1 TABLET (20 MG TOTAL) BY MOUTH DAILY AS NEEDED FOR FLUID OR EDEMA., Disp: 90 tablet, Rfl: 2   ipratropium (ATROVENT) 0.06 % nasal spray, Place 2 sprays into both nostrils 2 (two) times daily., Disp: 15 mL, Rfl: 6   mirtazapine (REMERON) 15 MG tablet, Take 15 mg by mouth at bedtime., Disp: , Rfl:    mometasone (NASONEX) 50 MCG/ACT nasal spray, USE 2 SPRAYS NASALLY 2 TIMES DAILY, Disp: 17 g, Rfl: 11   montelukast (SINGULAIR) 10 MG tablet, TAKE 1 TABLET BY MOUTH EVERYDAY AT BEDTIME (Patient not taking: Reported on 12/26/2022), Disp: 90 tablet, Rfl: 2   Multiple Vitamin (MULTIVITAMIN WITH MINERALS) TABS tablet, Take 1 tablet by mouth daily. Centrum Silver Multivitamin, Disp: , Rfl:    multivitamin-lutein (OCUVITE-LUTEIN) CAPS capsule, Take 1 capsule by mouth daily., Disp: , Rfl:    mupirocin ointment (BACTROBAN) 2 %, as needed. , Disp: , Rfl:    nitroGLYCERIN (NITROSTAT) 0.4 MG SL tablet, Place 1 tablet (0.4 mg total) under the tongue every 5 (five) minutes as needed for chest pain. (Patient not taking: Reported on 12/26/2022), Disp: 25 tablet, Rfl: 3   pantoprazole (PROTONIX) 40 MG tablet, Take 40 mg by mouth daily., Disp: , Rfl:    potassium chloride (KLOR-CON) 10 MEQ tablet, Take 1 tablet (10 mEq  total) by mouth daily., Disp: 90 tablet, Rfl: 3   pramipexole (MIRAPEX) 0.125 MG tablet, Take 0.375 mg by mouth at bedtime., Disp: , Rfl:    rivaroxaban (XARELTO) 10 MG TABS tablet, Take 10 mg by mouth daily., Disp: , Rfl:    rosuvastatin (CRESTOR) 5 MG tablet, TAKE 1 TABLET BY MOUTH EVERYDAY AT BEDTIME, Disp: 90 tablet, Rfl: 3   Spacer/Aero-Holding Chambers (AEROCHAMBER MV) inhaler, by Other route. Use as instructed , Disp: , Rfl:    telmisartan (MICARDIS) 20 MG tablet, Take 20 mg by mouth daily. , Disp: , Rfl:    vitamin B-12  (CYANOCOBALAMIN) 500 MCG tablet, Take 500 mcg by mouth daily.  , Disp: , Rfl:   Current Facility-Administered Medications:    albuterol (PROVENTIL) (2.5 MG/3ML) 0.083% nebulizer solution 2.5 mg, 2.5 mg, Nebulization, Q6H PRN, Bevelyn Ngo, NP, 2.5 mg at 10/08/21 0918   albuterol (VENTOLIN HFA) 108 (90 Base) MCG/ACT inhaler 2 puff, 2 puff, Inhalation, Once PRN, Causey, Larna Daughters, NP   diphenhydrAMINE (BENADRYL) injection 50 mg, 50 mg, Intramuscular, Once PRN, Causey, Larna Daughters, NP   EPINEPHrine (EPI-PEN) injection 0.3 mg, 0.3 mg, Intramuscular, Once PRN, Causey, Larna Daughters, NP  Past Medical History: Past Medical History:  Diagnosis Date   Anemia    after hysterectomy   Angio-edema    Asthma, severe persistent    pulmologist-- Ramaswamy   Cancer [facility])    Breast cancer on right   Chronic kidney disease, stage II (mild)    DDD (degenerative disc disease), cervical    Diverticulitis    Diverticulosis yrs ago   hx of    Edema, lower extremity    occ both legs swell   GERD (gastroesophageal reflux disease)    Headache    sinus headaches   Heart murmur    no problems - per pt   History of breast cancer 1990 left mastectomy also   1989  S/P   RIGHT MASTECTOMY ;  NO CHEMORADIATION //   NO RECURRENCE   History of DVT of lower extremity 5 yrs ago   right leg   History of shingles    Hyperlipidemia    Internal hemorrhoid    Leg ulcer, left (HCC)    Osteoporosis, unspecified    Knee and hip osteoarthritis bilaterally   Perennial allergic rhinitis    Peripheral vascular disease (HCC)    Pneumonia    Restless legs syndrome (RLS)    Rheumatoid arthritis (HCC)    hands   Thyroid nodule    followed by dr perrini yearly, no current problam   Unspecified essential hypertension    Urticaria     Tobacco Use: Social History   Tobacco Use  Smoking Status Never  Smokeless Tobacco Never    Labs: Review Flowsheet       Latest Ref Rng & Units 11/10/2011  03/03/2013 02/23/2014 03/19/2017  Labs for ITP Cardiac and Pulmonary Rehab  Cholestrol 100 - 199 mg/dL 409  811  914  782   LDL (calc) 0 - 99 mg/dL 49  62  79  61   HDL-C >39 mg/dL 95.62  130.86  57.84  696   Trlycerides 0 - 149 mg/dL 29.5  28.4  13.2  57     Details            Capillary Blood Glucose: No results found for: "GLUCAP"   Pulmonary Assessment Scores:  Pulmonary Assessment Scores     Row Name 12/26/22 [phone] 04/07/23 [phone]  ADL UCSD   ADL Phase Entry Exit    SOB Score total 46 22      CAT Score   CAT Score 19 12      mMRC Score   mMRC Score 3 --            UCSD: Self-administered rating of dyspnea associated with activities of daily living (ADLs) 6-point scale (0 = "not at all" to 5 = "maximal or unable to do because of breathlessness")  Scoring Scores range from 0 to 120.  Minimally important difference is 5 units  CAT: CAT can identify the health impairment of COPD patients and is better correlated with disease progression.  CAT has a scoring range of zero to 40. The CAT score is classified into four groups of low (less than 10), medium (10 - 20), high (21-30) and very high (31-40) based on the impact level of disease on health status. A CAT score over 10 suggests significant symptoms.  A worsening CAT score could be explained by an exacerbation, poor medication adherence, poor inhaler technique, or progression of COPD or comorbid conditions.  CAT MCID is 2 points  mMRC: mMRC (Modified Medical Research Council) Dyspnea Scale is used to assess the degree of baseline functional disability in patients of respiratory disease due to dyspnea. No minimal important difference is established. A decrease in score of 1 point or greater is considered a positive change.   Pulmonary Function Assessment:  Pulmonary Function Assessment - 12/26/22 1051       Breath   Bilateral Breath Sounds Clear    Shortness of Breath Yes;Limiting activity              Exercise Target Goals: Exercise Program Goal: Individual exercise prescription set using results from initial 6 min walk test and THRR while considering  patient's activity barriers and safety.   Exercise Prescription Goal: Initial exercise prescription builds to 30-45 minutes a day of aerobic activity, 2-3 days per week.  Home exercise guidelines will be given to patient during program as part of exercise prescription that the participant will acknowledge.  Activity Barriers & Risk Stratification:  Activity Barriers & Cardiac Risk Stratification - 12/26/22 0924       Activity Barriers & Cardiac Risk Stratification   Activity Barriers Arthritis;Back Problems;Deconditioning;Muscular Weakness;Shortness of Breath;Right Hip Replacement;Joint Problems    Comments waiting for right shoulder replacement             6 Minute Walk:  6 Minute Walk     Row Name 12/26/22 1042         6 Minute Walk   Phase Initial     Distance 1022 feet     Walk Time 6 minutes     # of Rest Breaks 2  3:10-3:37, 5:37-6:00     MPH 1.97     METS 1.52     RPE 13     Perceived Dyspnea  2     VO2 Peak 5.34     Symptoms No     Resting HR 80 bpm     Resting BP 140/74     Resting Oxygen Saturation  100 %     Exercise Oxygen Saturation  during 6 min walk 95 %     Max Ex. HR 89 bpm     Max Ex. BP 150/80     2 Minute Post BP 148/78       Interval HR   1 Minute HR 82  2 Minute HR 86     3 Minute HR 86     4 Minute HR 85     5 Minute HR 89     6 Minute HR 89     2 Minute Post HR 85     Interval Heart Rate? Yes       Interval Oxygen   Interval Oxygen? Yes     Baseline Oxygen Saturation % 100 %     1 Minute Oxygen Saturation % 98 %     1 Minute Liters of Oxygen 0 L     2 Minute Oxygen Saturation % 95 %     2 Minute Liters of Oxygen 0 L     3 Minute Oxygen Saturation % 97 %     3 Minute Liters of Oxygen 0 L     4 Minute Oxygen Saturation % 99 %     4 Minute Liters of Oxygen 0 L     5  Minute Oxygen Saturation % 100 %     5 Minute Liters of Oxygen 0 L     6 Minute Oxygen Saturation % 96 %     6 Minute Liters of Oxygen 0 L     2 Minute Post Oxygen Saturation % 97 %     2 Minute Post Liters of Oxygen 0 L              Oxygen Initial Assessment:  Oxygen Initial Assessment - 12/26/22 0927       Home Oxygen   Home Oxygen Device None    Sleep Oxygen Prescription None    Home Exercise Oxygen Prescription None    Home Resting Oxygen Prescription None      Initial 6 min Walk   Oxygen Used None      Program Oxygen Prescription   Program Oxygen Prescription None      Intervention   Short Term Goals To learn and understand importance of maintaining oxygen saturations>88%;To learn and demonstrate proper use of respiratory medications;To learn and understand importance of monitoring SPO2 with pulse oximeter and demonstrate accurate use of the pulse oximeter.;To learn and demonstrate proper pursed lip breathing techniques or other breathing techniques.     Long  Term Goals Verbalizes importance of monitoring SPO2 with pulse oximeter and return demonstration;Maintenance of O2 saturations>88%;Exhibits proper breathing techniques, such as pursed lip breathing or other method taught during program session;Compliance with respiratory medication;Demonstrates proper use of MDI's             Oxygen Re-Evaluation:  Oxygen Re-Evaluation     Row Name 01/07/23 0950 02/04/23 0805 03/03/23 1616 04/01/23 1214       Program Oxygen Prescription   Program Oxygen Prescription None None None None      Home Oxygen   Home Oxygen Device None None None None    Sleep Oxygen Prescription None None None None    Home Exercise Oxygen Prescription None None None None    Home Resting Oxygen Prescription None None None None      Goals/Expected Outcomes   Short Term Goals To learn and understand importance of maintaining oxygen saturations>88%;To learn and demonstrate proper use of  respiratory medications;To learn and understand importance of monitoring SPO2 with pulse oximeter and demonstrate accurate use of the pulse oximeter.;To learn and demonstrate proper pursed lip breathing techniques or other breathing techniques.  To learn and understand importance of maintaining oxygen saturations>88%;To learn and demonstrate proper use of respiratory medications;To learn and  understand importance of monitoring SPO2 with pulse oximeter and demonstrate accurate use of the pulse oximeter.;To learn and demonstrate proper pursed lip breathing techniques or other breathing techniques.  To learn and understand importance of maintaining oxygen saturations>88%;To learn and demonstrate proper use of respiratory medications;To learn and understand importance of monitoring SPO2 with pulse oximeter and demonstrate accurate use of the pulse oximeter.;To learn and demonstrate proper pursed lip breathing techniques or other breathing techniques.  To learn and understand importance of maintaining oxygen saturations>88%;To learn and demonstrate proper use of respiratory medications;To learn and understand importance of monitoring SPO2 with pulse oximeter and demonstrate accurate use of the pulse oximeter.;To learn and demonstrate proper pursed lip breathing techniques or other breathing techniques.     Long  Term Goals Verbalizes importance of monitoring SPO2 with pulse oximeter and return demonstration;Maintenance of O2 saturations>88%;Exhibits proper breathing techniques, such as pursed lip breathing or other method taught during program session;Compliance with respiratory medication;Demonstrates proper use of MDI's Verbalizes importance of monitoring SPO2 with pulse oximeter and return demonstration;Maintenance of O2 saturations>88%;Exhibits proper breathing techniques, such as pursed lip breathing or other method taught during program session;Compliance with respiratory medication;Demonstrates proper use of  MDI's Verbalizes importance of monitoring SPO2 with pulse oximeter and return demonstration;Maintenance of O2 saturations>88%;Exhibits proper breathing techniques, such as pursed lip breathing or other method taught during program session;Compliance with respiratory medication;Demonstrates proper use of MDI's Verbalizes importance of monitoring SPO2 with pulse oximeter and return demonstration;Maintenance of O2 saturations>88%;Exhibits proper breathing techniques, such as pursed lip breathing or other method taught during program session;Compliance with respiratory medication;Demonstrates proper use of MDI's    Goals/Expected Outcomes Compliance and understanding of oxygen saturation monitoring and breathing techniques to decrease shortness of breath. Compliance and understanding of oxygen saturation monitoring and breathing techniques to decrease shortness of breath. Compliance and understanding of oxygen saturation monitoring and breathing techniques to decrease shortness of breath. Compliance and understanding of oxygen saturation monitoring and breathing techniques to decrease shortness of breath.             Oxygen Discharge (Final Oxygen Re-Evaluation):  Oxygen Re-Evaluation - 04/01/23 1214       Program Oxygen Prescription   Program Oxygen Prescription None      Home Oxygen   Home Oxygen Device None    Sleep Oxygen Prescription None    Home Exercise Oxygen Prescription None    Home Resting Oxygen Prescription None      Goals/Expected Outcomes   Short Term Goals To learn and understand importance of maintaining oxygen saturations>88%;To learn and demonstrate proper use of respiratory medications;To learn and understand importance of monitoring SPO2 with pulse oximeter and demonstrate accurate use of the pulse oximeter.;To learn and demonstrate proper pursed lip breathing techniques or other breathing techniques.     Long  Term Goals Verbalizes importance of monitoring SPO2 with pulse  oximeter and return demonstration;Maintenance of O2 saturations>88%;Exhibits proper breathing techniques, such as pursed lip breathing or other method taught during program session;Compliance with respiratory medication;Demonstrates proper use of MDI's    Goals/Expected Outcomes Compliance and understanding of oxygen saturation monitoring and breathing techniques to decrease shortness of breath.             Initial Exercise Prescription:  Initial Exercise Prescription - 12/26/22 1000       Date of Initial Exercise RX and Referring Provider   Date 12/26/22    Referring Provider [facility]    Expected Discharge Date 03/26/23      NuStep  Level 1    SPM 50    Minutes 30    METs 1.5      Prescription Details   Frequency (times per week) 2    Duration Progress to 30 minutes of continuous aerobic without signs/symptoms of physical distress      Intensity   THRR 40-80% of Max Heartrate 53-106    Ratings of Perceived Exertion 11-13    Perceived Dyspnea 0-4      Progression   Progression Continue to progress workloads to maintain intensity without signs/symptoms of physical distress.      Resistance Training   Training Prescription Yes    Weight red bands    Reps 10-15             Perform Capillary Blood Glucose checks as needed.  Exercise Prescription Changes:   Exercise Prescription Changes     Row Name 01/13/23 1200 01/27/23 1200 02/10/23 1100 02/19/23 1500 02/24/23 1200     Response to Exercise   Blood Pressure (Admit) 138/66 122/72 120/60 -- 148/56   Blood Pressure (Exercise) 150/68 166/68 156/68 -- 150/62   Blood Pressure (Exit) 140/62 146/72 120/60 -- 116/60   Heart Rate (Admit) 87 bpm 73 bpm 76 bpm -- 79 bpm   Heart Rate (Exercise) 97 bpm 96 bpm 95 bpm -- 92 bpm   Heart Rate (Exit) 89 bpm 88 bpm 86 bpm -- 85 bpm   Oxygen Saturation (Admit) 95 % 96 % 98 % -- 98 %   Oxygen Saturation (Exercise) 95 % 96 % 98 % -- 96 %   Oxygen Saturation (Exit) 96 % 95 % 95  % -- 98 %   Rating of Perceived Exertion (Exercise) 13 13 12  -- 13   Perceived Dyspnea (Exercise) 2 2 3  -- 2   Duration Progress to 30 minutes of  aerobic without signs/symptoms of physical distress Continue with 30 min of aerobic exercise without signs/symptoms of physical distress. Continue with 30 min of aerobic exercise without signs/symptoms of physical distress. -- Continue with 30 min of aerobic exercise without signs/symptoms of physical distress.   Intensity THRR unchanged THRR unchanged THRR unchanged -- THRR unchanged     Resistance Training   Training Prescription Yes Yes Yes -- Yes   Weight red bands red bands red bands -- red bands   Reps 10-15 10-15 10-15 -- 10-15   Time 10 Minutes 10 Minutes 10 Minutes -- 10 Minutes     NuStep   Level 1 2 3  -- 3   SPM -- -- 86 -- 86   Minutes 15 15 15  -- 15   METs 2.6 1.9 3.1 -- 3     Track   Laps 8 8 10  -- 7   Minutes 15 15 15  -- 15   METs 2.23 2.23 2.54 -- 2.08     Home Exercise Plan   Plans to continue exercise at -- -- -- Home (comment) --   Frequency -- -- -- Add 2 additional days to program exercise sessions. --   Initial Home Exercises Provided -- -- -- 02/19/23 --    Row Name 03/10/23 1100 03/24/23 1200 04/07/23 0900         Response to Exercise   Blood Pressure (Admit) 124/68 132/70 142/70     Blood Pressure (Exercise) 140/60 172/82 190/70     Blood Pressure (Exit) 110/60 120/60 138/70     Heart Rate (Admit) 78 bpm 75 bpm 79 bpm     Heart Rate (Exercise)  94 bpm 103 bpm 96 bpm     Heart Rate (Exit) 86 bpm 82 bpm 88 bpm     Oxygen Saturation (Admit) 97 % 100 % 97 %     Oxygen Saturation (Exercise) 97 % 103 % 96 %     Oxygen Saturation (Exit) 97 % 98 % 97 %     Rating of Perceived Exertion (Exercise) 13 13 13      Perceived Dyspnea (Exercise) 2 1 2      Duration Continue with 30 min of aerobic exercise without signs/symptoms of physical distress. Continue with 30 min of aerobic exercise without signs/symptoms of  physical distress. Continue with 30 min of aerobic exercise without signs/symptoms of physical distress.     Intensity THRR unchanged THRR unchanged THRR unchanged       Resistance Training   Training Prescription Yes Yes Yes     Weight red bands red bands red bands     Reps 10-15 10-15 10-15     Time 10 Minutes 10 Minutes 10 Minutes       NuStep   Level 3 4 4      SPM 81 86 79     Minutes 15 15 8      METs 3.2 3.5 3.2       Track   Laps 8 9 12      Minutes 15 15 20      METs 2.23 2.38 2.38              Exercise Comments:   Exercise Comments     Row Name 01/01/23 0948 02/19/23 1521         Exercise Comments Pt completed first day of exercise. Madalaine exercised for 30 min on the Nustep. She averaged 2.2 METs at level 1 on the Nustep. Charl performed the warmup and cooldown standing holding onto a chair for balance with verbal cues. Discussed METs and how to increase METs. Completed home ExRx. Neve is not consistently exercising at home. I discussed the difference between ADL's and exercise. Mikaela voiced understanding. I encouraged Shavonna to start walking 3-5 days/wk for 30 min/day inside her home or outside on her driveway. She agreed with my recommendations. 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 This limits her time substantially. Will continue to check up on her home exercise.               Exercise Goals and Review:   Exercise Goals     Row Name 12/26/22 1038 01/07/23 0946           Exercise Goals   Increase Physical Activity Yes Yes      Intervention Provide advice, education, support and counseling about physical activity/exercise needs.;Develop an individualized exercise prescription for aerobic and resistive training based on initial evaluation findings, risk stratification, comorbidities and participant's personal goals. Provide advice, education, support and counseling about physical  activity/exercise needs.;Develop an individualized exercise prescription for aerobic and resistive training based on initial evaluation findings, risk stratification, comorbidities and participant's personal goals.      Expected Outcomes Short Term: Attend rehab on a regular basis to increase amount of physical activity.;Long Term: Exercising regularly at least 3-5 days a week.;Long Term: Add in home exercise to make exercise part of routine and to increase amount of physical activity. Short Term: Attend rehab on a regular basis to increase amount of physical activity.;Long Term: Exercising regularly at least 3-5 days a week.;Long Term:  Add in home exercise to make exercise part of routine and to increase amount of physical activity.      Increase Strength and Stamina Yes Yes      Intervention Provide advice, education, support and counseling about physical activity/exercise needs.;Develop an individualized exercise prescription for aerobic and resistive training based on initial evaluation findings, risk stratification, comorbidities and participant's personal goals. Provide advice, education, support and counseling about physical activity/exercise needs.;Develop an individualized exercise prescription for aerobic and resistive training based on initial evaluation findings, risk stratification, comorbidities and participant's personal goals.      Expected Outcomes Short Term: Increase workloads from initial exercise prescription for resistance, speed, and METs.;Short Term: Perform resistance training exercises routinely during rehab and add in resistance training at home;Long Term: Improve cardiorespiratory fitness, muscular endurance and strength as measured by increased METs and functional capacity ( ) Short Term: Increase workloads from initial exercise prescription for resistance, speed, and METs.;Short Term: Perform resistance training exercises routinely during rehab and add in resistance training at  home;Long Term: Improve cardiorespiratory fitness, muscular endurance and strength as measured by increased METs and functional capacity ( )      Able to understand and use rate of perceived exertion (RPE) scale Yes Yes      Intervention Provide education and explanation on how to use RPE scale Provide education and explanation on how to use RPE scale      Expected Outcomes Short Term: Able to use RPE daily in rehab to express subjective intensity level;Long Term:  Able to use RPE to guide intensity level when exercising independently Short Term: Able to use RPE daily in rehab to express subjective intensity level;Long Term:  Able to use RPE to guide intensity level when exercising independently      Able to understand and use Dyspnea scale Yes Yes      Intervention Provide education and explanation on how to use Dyspnea scale Provide education and explanation on how to use Dyspnea scale      Expected Outcomes Short Term: Able to use Dyspnea scale daily in rehab to express subjective sense of shortness of breath during exertion;Long Term: Able to use Dyspnea scale to guide intensity level when exercising independently Short Term: Able to use Dyspnea scale daily in rehab to express subjective sense of shortness of breath during exertion;Long Term: Able to use Dyspnea scale to guide intensity level when exercising independently      Knowledge and understanding of Target Heart Rate Range (THRR) Yes Yes      Intervention Provide education and explanation of THRR including how the numbers were predicted and where they are located for reference Provide education and explanation of THRR including how the numbers were predicted and where they are located for reference      Expected Outcomes Short Term: Able to state/look up THRR;Long Term: Able to use THRR to govern intensity when exercising independently;Short Term: Able to use daily as guideline for intensity in rehab Short Term: Able to state/look up  THRR;Long Term: Able to use THRR to govern intensity when exercising independently;Short Term: Able to use daily as guideline for intensity in rehab      Understanding of Exercise Prescription Yes Yes      Intervention Provide education, explanation, and written materials on patient's individual exercise prescription Provide education, explanation, and written materials on patient's individual exercise prescription      Expected Outcomes Short Term: Able to explain program exercise prescription;Long Term: Able to explain home exercise prescription to  exercise independently Short Term: Able to explain program exercise prescription;Long Term: Able to explain home exercise prescription to exercise independently               Exercise Goals Re-Evaluation :  Exercise Goals Re-Evaluation     Row Name 01/07/23 0946 02/04/23 0754 03/03/23 1617 04/01/23 1211       Exercise Goal Re-Evaluation   Exercise Goals Review Increase Physical Activity;Able to understand and use Dyspnea scale;Understanding of Exercise Prescription;Increase Strength and Stamina;Knowledge and understanding of Target Heart Rate Range (THRR);Able to understand and use rate of perceived exertion (RPE) scale Increase Physical Activity;Able to understand and use Dyspnea scale;Understanding of Exercise Prescription;Increase Strength and Stamina;Knowledge and understanding of Target Heart Rate Range (THRR);Able to understand and use rate of perceived exertion (RPE) scale Increase Physical Activity;Able to understand and use Dyspnea scale;Understanding of Exercise Prescription;Increase Strength and Stamina;Knowledge and understanding of Target Heart Rate Range (THRR);Able to understand and use rate of perceived exertion (RPE) scale Increase Physical Activity;Able to understand and use Dyspnea scale;Understanding of Exercise Prescription;Increase Strength and Stamina;Knowledge and understanding of Target Heart Rate Range (THRR);Able to  understand and use rate of perceived exertion (RPE) scale    Comments Ellyse has completed 2 exercise sessions. She exercises for 30 min on the Nustep. Markiya averages 2.2 METs at level 1 on the Nustep. She performs the warmup and cooldown standing holding onto a chair with verbal cues. I am considering progressing her to 2x15 min exercise modes. Will discuss with pt next exercise session. It is too soon to note any discernable progressions. Will continue to monitor and progress as able. Alzora has completed 10 exercise sessions. She exercises for 15 min on the Nustep and track. She averages 1.9 METs at level 2 on the Nustep and 2.23 METs on the track. Lameeka performs the warmup and cooldown standing holding onto a chair for balance. She has progressed to walking the track for 15 min. This helps with her chronic hip pain. She tolerates the track well and takes rest breaks as needed. Will continue to monitor and progress as able. Sharesa has completed 17 exercise sessions. She exercises for 15 min on the Nustep and track. She averages 3.3 METs at level 4 on the Nustep and 2.23 METs on the track. Adelena performs the warmup and cooldown standing holding onto a chair for balance. Cache has increased her level on the Nustep as METs have also increased. She feels she has benefited from the exercise. We have recently discussed home exercise. I encouraged Argyle to start walking at home. Will continue to monitor and progress as able. Shayera has completed 25 exercise sessions. She exercises for 15 min on the Nustep and track. She averages 3.4 METs at level 3 on the Nustep and 2.85 METs on the track. Lolamae performs the warmup and cooldown standing holding onto a chair for balance. Bella had to decrease her workload on the Nustep due to right hip pain. She was able to significantly increase her lap count on the track. Yoshimi feels she has improved since starting rehab. Will continue to monitor and progress as  able.    Expected Outcomes Through exercise at rehab and home, the patient will decrease shortness of breath with daily activities and feel confident in carrying out an exercise regimen at home. Through exercise at rehab and home, the patient will decrease shortness of breath with daily activities and feel confident in carrying out an exercise regimen at home. Through exercise at rehab and  home, the patient will decrease shortness of breath with daily activities and feel confident in carrying out an exercise regimen at home. Through exercise at rehab and home, the patient will decrease shortness of breath with daily activities and feel confident in carrying out an exercise regimen at home.             Discharge Exercise Prescription (Final Exercise Prescription Changes):  Exercise Prescription Changes - 04/07/23 0900       Response to Exercise   Blood Pressure (Admit) 142/70    Blood Pressure (Exercise) 190/70    Blood Pressure (Exit) 138/70    Heart Rate (Admit) 79 bpm    Heart Rate (Exercise) 96 bpm    Heart Rate (Exit) 88 bpm    Oxygen Saturation (Admit) 97 %    Oxygen Saturation (Exercise) 96 %    Oxygen Saturation (Exit) 97 %    Rating of Perceived Exertion (Exercise) 13    Perceived Dyspnea (Exercise) 2    Duration Continue with 30 min of aerobic exercise without signs/symptoms of physical distress.    Intensity THRR unchanged      Resistance Training   Training Prescription Yes    Weight red bands    Reps 10-15    Time 10 Minutes      NuStep   Level 4    SPM 79    Minutes 8    METs 3.2      Track   Laps 12    Minutes 20    METs 2.38             Nutrition:  Target Goals: Understanding of nutrition guidelines, daily intake of sodium 1500mg , cholesterol 200mg , calories 30% from fat and 7% or less from saturated fats, daily to have 5 or more servings of fruits and vegetables.  Biometrics:    Nutrition Therapy Plan and Nutrition Goals:  Nutrition Therapy  & Goals - 04/02/23 1132       Nutrition Therapy   Diet General Healthy Diet    Drug/Food Interactions Statins/Certain Fruits      Personal Nutrition Goals   Nutrition Goal Patient to continue diet quality by using the plate method as a guide for meal planning to include lean protein/plant protein, fruits, vegetables, whole grains, nonfat dairy as part of a well-balanced diet.   goal in progress.   Comments Goal in progess. Maesa reports eating wide variety of foods focused on heart healthy cooking methods, fruit/vegetable intake, and lean protein sources. She does eat out on occasion and eats three meals daily. She is down 2.2# since orientation weight; weight remains appropriate for age. Her husband remains a good support. Patient will benefit from participation in pulmonary rehab for nutrition, exercise, and lifestyle modification.      Intervention Plan   Intervention Prescribe, educate and counsel regarding individualized specific dietary modifications aiming towards targeted core components such as weight, hypertension, lipid management, diabetes, heart failure and other comorbidities.;Nutrition handout(s) given to patient.    Expected Outcomes Short Term Goal: Understand basic principles of dietary content, such as calories, fat, sodium, cholesterol and nutrients.;Long Term Goal: Adherence to prescribed nutrition plan.             Nutrition Assessments:  Nutrition Assessments - 04/07/23 0823       Rate Your Plate Scores   Post Score 60            MEDIFICTS Score Key: >=70 Need to make dietary changes  40-70 Heart Healthy  Diet <= 40 Therapeutic Level Cholesterol Diet  Flowsheet Row PULMONARY REHAB OTHER RESPIRATORY from 01/01/2023 in [facility] for Heart, Vascular, & Lung Health  Picture Your Plate Total Score on Admission 50      Picture Your Plate Scores: <16 Unhealthy dietary pattern with much room for improvement. 41-50 Dietary pattern  unlikely to meet recommendations for good health and room for improvement. 51-60 More healthful dietary pattern, with some room for improvement.  >60 Healthy dietary pattern, although there may be some specific behaviors that could be improved.    Nutrition Goals Re-Evaluation:  Nutrition Goals Re-Evaluation     Row Name 01/01/23 1340 02/03/23 1514 03/03/23 1157 04/02/23 1132       Goals   Current Weight 138 lb 14.2 oz (63 kg) 139 lb 1.8 oz (63.1 kg) 141 lb 15.6 oz (64.4 kg) 142 lb 10.2 oz (64.7 kg)    Comment Cr 1.02, GFR 54 no new labs; most recent labs Cr 1.02, GFR 54 no new labs; most recent labs Cr 1.02, GFR 54 no new labs; most recent labs Cr 1.02, GFR 54    Expected Outcome Goal in progess. Thembi reports eating wide variety of foods focused on heart healthy cooking methods, fruit/vegetable intake, and lean protein sources. She does eat out on occasion. Her husband remains a good support. Patient will benefit from participation in pulmonary rehab for nutrition, exercise, and lifestyle modification. Goal in progess. Norissa reports eating wide variety of foods focused on heart healthy cooking methods, fruit/vegetable intake, and lean protein sources. She does eat out on occasion. She is down 5.7# since orientation weight; however, she has maintained her weight over the last 4 weeks. Her husband remains a good support. Patient will benefit from participation in pulmonary rehab for nutrition, exercise, and lifestyle modification. Goal in progess. Rosaline reports eating wide variety of foods focused on heart healthy cooking methods, fruit/vegetable intake, and lean protein sources. She does eat out on occasion and eats three meals daily. She is down 2.9# since orientation weight; weight remains appropriate for age. Her husband remains a good support. Patient will benefit from participation in pulmonary rehab for nutrition, exercise, and lifestyle modification. Goal in progess. Leenah reports  eating wide variety of foods focused on heart healthy cooking methods, fruit/vegetable intake, and lean protein sources. She does eat out on occasion and eats three meals daily. She is down 2.2# since orientation weight; weight remains appropriate for age. Her husband remains a good support. Patient will benefit from participation in pulmonary rehab for nutrition, exercise, and lifestyle modification.             Nutrition Goals Discharge (Final Nutrition Goals Re-Evaluation):  Nutrition Goals Re-Evaluation - 04/02/23 1132       Goals   Current Weight 142 lb 10.2 oz (64.7 kg)    Comment no new labs; most recent labs Cr 1.02, GFR 54    Expected Outcome Goal in progess. Latravia reports eating wide variety of foods focused on heart healthy cooking methods, fruit/vegetable intake, and lean protein sources. She does eat out on occasion and eats three meals daily. She is down 2.2# since orientation weight; weight remains appropriate for age. Her husband remains a good support. Patient will benefit from participation in pulmonary rehab for nutrition, exercise, and lifestyle modification.             Psychosocial: Target Goals: Acknowledge presence or absence of significant depression and/or stress, maximize coping skills, provide positive support  system. Participant is able to verbalize types and ability to use techniques and skills needed for reducing stress and depression.  Initial Review & Psychosocial Screening:  Initial Psych Review & Screening - 12/26/22 0925       Initial Review   Current issues with None Identified      Family Dynamics   Good Support System? 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 She denies any psychosocial barriers to rehab      Barriers   Psychosocial barriers to participate in program There are no identifiable barriers or psychosocial needs.             Quality of Life Scores:  Scores of 19 and below usually indicate a poorer quality  of life in these areas.  A difference of  2-3 points is a clinically meaningful difference.  A difference of 2-3 points in the total score of the Quality of Life Index has been associated with significant improvement in overall quality of life, self-image, physical symptoms, and general health in studies assessing change in quality of life.  PHQ-9: Review Flowsheet       04/07/2023 12/26/2022 01/01/2015  Depression screen PHQ 2/9  Decreased Interest 0 0 0  Down, Depressed, Hopeless 0 0 0  PHQ - 2 Score 0 0 0  Altered sleeping 0 3 -  Tired, decreased energy 2 1 -  Change in appetite 1 0 -  Feeling bad or failure about yourself  0 0 -  Trouble concentrating 0 0 -  Moving slowly or fidgety/restless 0 0 -  Suicidal thoughts 0 0 -  PHQ-9 Score 3 4 -  Difficult doing work/chores Not difficult at all Somewhat difficult -    Details           Interpretation of Total Score  Total Score Depression Severity:  1-4 = Minimal depression, 5-9 = Mild depression, 10-14 = Moderate depression, 15-19 = Moderately severe depression, 20-27 = Severe depression   Psychosocial Evaluation and Intervention:  Psychosocial Evaluation - 12/26/22 0926       Psychosocial Evaluation & Interventions   Interventions Encouraged to exercise with the program and follow exercise prescription    Comments Lyricc denies any psychosocial barriers to rehab    Expected Outcomes For Affie to participate in rehab free of psychosocial concerns.    Continue Psychosocial Services  No Follow up required             Psychosocial Re-Evaluation:  Psychosocial Re-Evaluation     Row Name 01/05/23 1350 02/02/23 1545 03/04/23 1430 04/01/23 0912       Psychosocial Re-Evaluation   Current issues with None Identified Current Stress Concerns Current Stress Concerns Current Stress Concerns    Comments No changes since starting the program. Kristol has attended 1 class so far. Claiborne has stated to staff that she is dealing  with a great deal of stress currently. Her 33 year old sister lives in an assisted living facility. Due to this Reiana has been tasked with cleaning out her sisters house. This has brought on a great deal of stress for Maryonna. Staff has encouraged Miryam to listen to her body and rest when needed. Staff has also educated Shamecka on healthy ways to deal with stress. Chamaine states that her stress is under control at this time. She has finished cleaning her sister's house and this has lifted alot of Jozey's stress. She decllines any needs at this time. Ahnika denies any stress concerns  at this time. She declines any needs at this time.    Expected Outcomes For patient to attend PR without any psychosocial barriers or concerns For patient to attend PR without any psychosocial barriers or concerns For patient to attend PR without any psychosocial barriers or concerns For patient to attend PR without any psychosocial barriers or concerns    Interventions Encouraged to attend Pulmonary Rehabilitation for the exercise Encouraged to attend Pulmonary Rehabilitation for the exercise;Stress management education Encouraged to attend Pulmonary Rehabilitation for the exercise Encouraged to attend Pulmonary Rehabilitation for the exercise    Continue Psychosocial Services  No Follow up required Follow up required by staff No Follow up required No Follow up required             Psychosocial Discharge (Final Psychosocial Re-Evaluation):  Psychosocial Re-Evaluation - 04/01/23 0912       Psychosocial Re-Evaluation   Current issues with Current Stress Concerns    Comments Guerline denies any stress concerns at this time. She declines any needs at this time.    Expected Outcomes For patient to attend PR without any psychosocial barriers or concerns    Interventions Encouraged to attend Pulmonary Rehabilitation for the exercise    Continue Psychosocial Services  No Follow up required              Education: Education Goals: Education classes will be provided on a weekly basis, covering required topics. Participant will state understanding/return demonstration of topics presented.  Learning Barriers/Preferences:  Learning Barriers/Preferences - 12/26/22 9604       Learning Barriers/Preferences   Learning Barriers Sight    Learning Preferences Skilled Demonstration;Video;Pictoral             Education Topics: Know Your Numbers Group instruction that is supported by a PowerPoint presentation. Instructor discusses importance of knowing and understanding resting, exercise, and post-exercise oxygen saturation, heart rate, and blood pressure. Oxygen saturation, heart rate, blood pressure, rating of perceived exertion, and dyspnea are reviewed along with a normal range for these values.  Flowsheet Row PULMONARY REHAB OTHER RESPIRATORY from 02/12/2023 in [facility] for Heart, Vascular, & Lung Health  Date 02/12/23  Educator EP  Instruction Review Code 1- Verbalizes Understanding       Exercise for the Pulmonary Patient Group instruction that is supported by a PowerPoint presentation. Instructor discusses benefits of exercise, core components of exercise, frequency, duration, and intensity of an exercise routine, importance of utilizing pulse oximetry during exercise, safety while exercising, and options of places to exercise outside of rehab.  Flowsheet Row PULMONARY REHAB OTHER RESPIRATORY from 02/05/2023 in [facility] for Heart, Vascular, & Lung Health  Date 02/05/23  Educator EP  Instruction Review Code 1- Verbalizes Understanding       MET Level  Group instruction provided by PowerPoint, verbal discussion, and written material to support subject matter. Instructor reviews what METs are and how to increase METs.  Flowsheet Row PULMONARY REHAB OTHER RESPIRATORY from 01/08/2023 in [facility] for  Heart, Vascular, & Lung Health  Date 01/08/23  Educator EP  Instruction Review Code 1- Verbalizes Understanding       Pulmonary Medications Verbally interactive group education provided by instructor with focus on inhaled medications and proper administration. Flowsheet Row PULMONARY REHAB OTHER RESPIRATORY from 01/29/2023 in [facility] for Heart, Vascular, & Lung Health  Date 01/29/23  Educator RT  Instruction Review Code 1- Verbalizes Understanding  Anatomy and Physiology of the Respiratory System Group instruction provided by PowerPoint, verbal discussion, and written material to support subject matter. Instructor reviews respiratory cycle and anatomical components of the respiratory system and their functions. Instructor also reviews differences in obstructive and restrictive respiratory diseases with examples of each.  Flowsheet Row PULMONARY REHAB OTHER RESPIRATORY from 01/22/2023 in [facility] for Heart, Vascular, & Lung Health  Date 01/22/23  Educator RT  Instruction Review Code 1- Verbalizes Understanding       Oxygen Safety Group instruction provided by PowerPoint, verbal discussion, and written material to support subject matter. There is an overview of "What is Oxygen" and "Why do we need it".  Instructor also reviews how to create a safe environment for oxygen use, the importance of using oxygen as prescribed, and the risks of noncompliance. There is a brief discussion on traveling with oxygen and resources the patient may utilize. Flowsheet Row PULMONARY REHAB OTHER RESPIRATORY from 02/19/2023 in [facility] for Heart, Vascular, & Lung Health  Date 02/19/23  Educator RN  Instruction Review Code 1- Verbalizes Understanding       Oxygen Use Group instruction provided by PowerPoint, verbal discussion, and written material to discuss how supplemental oxygen is prescribed and different types  of oxygen supply systems. Resources for more information are provided.    Breathing Techniques Group instruction that is supported by demonstration and informational handouts. Instructor discusses the benefits of pursed lip and diaphragmatic breathing and detailed demonstration on how to perform both.  Flowsheet Row PULMONARY REHAB OTHER RESPIRATORY from 03/05/2023 in [facility] for Heart, Vascular, & Lung Health  Date 03/05/23  Educator RN  Instruction Review Code 1- Verbalizes Understanding        Risk Factor Reduction Group instruction that is supported by a PowerPoint presentation. Instructor discusses the definition of a risk factor, different risk factors for pulmonary disease, and how the heart and lungs work together. Flowsheet Row PULMONARY REHAB OTHER RESPIRATORY from 03/26/2023 in [facility] for Heart, Vascular, & Lung Health  Date 03/26/23  Educator EP  Instruction Review Code 1- Verbalizes Understanding       Pulmonary Diseases Group instruction provided by PowerPoint, verbal discussion, and written material to support subject matter. Instructor gives an overview of the different type of pulmonary diseases. There is also a discussion on risk factors and symptoms as well as ways to manage the diseases.   Stress and Energy Conservation Group instruction provided by PowerPoint, verbal discussion, and written material to support subject matter. Instructor gives an overview of stress and the impact it can have on the body. Instructor also reviews ways to reduce stress. There is also a discussion on energy conservation and ways to conserve energy throughout the day. Flowsheet Row PULMONARY REHAB OTHER RESPIRATORY from 03/12/2023 in [facility] for Heart, Vascular, & Lung Health  Date 03/12/23  Educator RN  Instruction Review Code 1- Verbalizes Understanding       Warning Signs and Symptoms Group  instruction provided by PowerPoint, verbal discussion, and written material to support subject matter. Instructor reviews warning signs and symptoms of stroke, heart attack, cold and flu. Instructor also reviews ways to prevent the spread of infection. Flowsheet Row PULMONARY REHAB OTHER RESPIRATORY from 03/19/2023 in [facility] for Heart, Vascular, & Lung Health  Date 03/19/23  Educator RN  Instruction Review Code 1- Verbalizes Understanding  Other Education Group or individual verbal, written, or video instructions that support the educational goals of the pulmonary rehab program. Flowsheet Row PULMONARY REHAB OTHER RESPIRATORY from 01/15/2023 in [facility] for Heart, Vascular, & Lung Health  Date 01/15/23  Educator RT  Instruction Review Code 1- Verbalizes Understanding        Knowledge Questionnaire Score:  Knowledge Questionnaire Score - 04/07/23 0826       Knowledge Questionnaire Score   Post Score 16/18             Core Components/Risk Factors/Patient Goals at Admission:  Personal Goals and Risk Factors at Admission - 12/26/22 0927       Core Components/Risk Factors/Patient Goals on Admission    Weight Management Weight Loss;Yes    Intervention Weight Management: Develop a combined nutrition and exercise program designed to reach desired caloric intake, while maintaining appropriate intake of nutrient and fiber, sodium and fats, and appropriate energy expenditure required for the weight goal.;Weight Management: Provide education and appropriate resources to help participant work on and attain dietary goals.;Weight Management/Obesity: Establish reasonable short term and long term weight goals.;Obesity: Provide education and appropriate resources to help participant work on and attain dietary goals.    Expected Outcomes Short Term: Continue to assess and modify interventions until short term weight is achieved;Long Term:  Adherence to nutrition and physical activity/exercise program aimed toward attainment of established weight goal;Weight Maintenance: Understanding of the daily nutrition guidelines, which includes 25-35% calories from fat, 7% or less cal from saturated fats, less than 200mg  cholesterol, less than 1.5gm of sodium, & 5 or more servings of fruits and vegetables daily;Weight Loss: Understanding of general recommendations for a balanced deficit meal plan, which promotes 1-2 lb weight loss per week and includes a negative energy balance of [phone] kcal/d;Understanding recommendations for meals to include 15-35% energy as protein, 25-35% energy from fat, 35-60% energy from carbohydrates, less than 200mg  of dietary cholesterol, 20-35 gm of total fiber daily;Understanding of distribution of calorie intake throughout the day with the consumption of 4-5 meals/snacks;Weight Gain: Understanding of general recommendations for a high calorie, high protein meal plan that promotes weight gain by distributing calorie intake throughout the day with the consumption for 4-5 meals, snacks, and/or supplements    Improve shortness of breath with ADL's Yes    Intervention Provide education, individualized exercise plan and daily activity instruction to help decrease symptoms of SOB with activities of daily living.    Expected Outcomes Short Term: Improve cardiorespiratory fitness to achieve a reduction of symptoms when performing ADLs;Long Term: Be able to perform more ADLs without symptoms or delay the onset of symptoms    Heart Failure Yes    Intervention Provide a combined exercise and nutrition program that is supplemented with education, support and counseling about heart failure. Directed toward relieving symptoms such as shortness of breath, decreased exercise tolerance, and extremity edema.    Expected Outcomes Improve functional capacity of life;Short term: Attendance in program 2-3 days a week with increased exercise  capacity. Reported lower sodium intake. Reported increased fruit and vegetable intake. Reports medication compliance.;Short term: Daily weights obtained and reported for increase. Utilizing diuretic protocols set by physician.;Long term: Adoption of self-care skills and reduction of barriers for early signs and symptoms recognition and intervention leading to self-care maintenance.             Core Components/Risk Factors/Patient Goals Review:   Goals and Risk Factor Review     Row Name  01/05/23 1351 02/02/23 1553 03/04/23 1432 04/01/23 0914       Core Components/Risk Factors/Patient Goals Review   Personal Goals Review Weight Management/Obesity;Heart Failure;Improve shortness of breath with ADL's;Develop more efficient breathing techniques such as purse lipped breathing and diaphragmatic breathing and practicing self-pacing with activity. Weight Management/Obesity;Improve shortness of breath with ADL's;Heart Failure Weight Management/Obesity;Improve shortness of breath with ADL's;Heart Failure Weight Management/Obesity;Improve shortness of breath with ADL's    Review Unable to evaluate progress yet. Rosali has attended 1 class so far. Goal progressing for weight loss. Jatanna has been working with our dietician for weight loss goals. Goal progressing on improving her shortness of breath with ADLs. Goal met on developing more efficient breathing techniques such as purse lipped breathing and diaphragmatic breathing; and practicing self-pacing with activity. Courteney is able to demonstrtae purse lip breathing when she gets short of breath. She also knows how to pace herself to help with shortness of breath.  Goal progressing on heart failure. We will continue to monitor her progress throughout the program. Goal progressing for weight loss. Elynore is down 2.9#'s since starting the program. She continues to work with the staff dietician to achieve her weight loss goals. Goal progressing on improving her  shortness of breath with ADLs. She has maintained O2 sats >88% on RA while exercising. Goal met on heart failure. No CHF exacerbations since she has been in [place].  We will continue to monitor her progress throughout the program. Goal progressing for weight loss. She continues to work with the staff dietitian to achieve her weight loss goals. Goal progressing on improving her shortness of breath with ADLs. She has maintained O2 sats >88% on RA while exercising. We will continue to monitor her progress throughout the program.    Expected Outcomes For Mailyn to lose weight, improve her shortness of breath with ADLs, develop more efficient breathing techniques, and have no HF exacerbations during her time in [place] See admission goals For Debborah to lose weight and improve her shortness of breath with ADL's For Jola Babinski to lose weight and improve her shortness of breath with ADL's             Core Components/Risk Factors/Patient Goals at Discharge (Final Review):   Goals and Risk Factor Review - 04/01/23 0914       Core Components/Risk Factors/Patient Goals Review   Personal Goals Review Weight Management/Obesity;Improve shortness of breath with ADL's    Review Goal progressing for weight loss. She continues to work with the staff dietitian to achieve her weight loss goals. Goal progressing on improving her shortness of breath with ADLs. She has maintained O2 sats >88% on RA while exercising. We will continue to monitor her progress throughout the program.    Expected Outcomes For Riese to lose weight and improve her shortness of breath with ADL's             ITP Comments:Pt is making expected progress toward Pulmonary Rehab goals after completing 27 session(s). Recommend continued exercise, life style modification, education, and utilization of breathing techniques to increase stamina and strength, while also decreasing shortness of breath with exertion.  Dr. Mechele Collin is Medical Director for  Pulmonary Rehab at [facility].

## 2023-04-14 ENCOUNTER — Other Ambulatory Visit: Payer: Self-pay | Admitting: Cardiovascular Disease

## 2023-04-14 ENCOUNTER — Encounter (HOSPITAL_COMMUNITY)
Admission: RE | Admit: 2023-04-14 | Discharge: 2023-04-14 | Disposition: A | Discharge status: Home / Self Care | Payer: Medicare Other | Source: Ambulatory Visit | Attending: Internal Medicine | Admitting: Internal Medicine

## 2023-04-14 DIAGNOSIS — J4550 Severe persistent asthma, uncomplicated: Secondary | ICD-10-CM | POA: Insufficient documentation

## 2023-04-14 NOTE — Progress Notes (Signed)
Daily Session Note  Patient Details  Name: [name] MRN: [id number] Date of Birth: 10/25/1932 Referring Provider:   Doristine Devoid Pulmonary Rehab Walk Test from 12/26/2022 in [facility]  Referring Provider Ramawamy       Encounter Date: 04/14/2023  Check In:  Session Check In - 04/14/23 1132       Check-In   Supervising physician immediately available to respond to emergencies [facility] MD immediately available    Physician(s) Eligha Bridegroom, NP    Location [facility]    Staff Present Essie Hart, RN, Doris Cheadle, MS, ACSM-CEP, Exercise Physiologist;Cornell Bourbon Idelle Crouch BS, ACSM-CEP, Exercise Physiologist;David Makemson, MS, ACSM-CEP, CCRP, Exercise Physiologist    Virtual Visit No    Medication changes reported     No    Fall or balance concerns reported    No    Tobacco Cessation No Change    Warm-up and Cool-down Performed as group-led instruction    Resistance Training Performed Yes    VAD Patient? No    PAD/SET Patient? No      Pain Assessment   Currently in Pain? No/denies             Capillary Blood Glucose: No results found. However, due to the size of the patient record, not all encounters were searched. Please check Results Review for a complete set of results.    Social History   Tobacco Use  Smoking Status Never  Smokeless Tobacco Never    Goals Met:  Independence with exercise equipment Exercise tolerated well No report of concerns or symptoms today Strength training completed today  Goals Unmet:  Not Applicable  Comments: Service time is from 1011 to 1150.    Dr. Mechele Collin is Medical Director for Pulmonary Rehab at [facility].

## 2023-04-16 ENCOUNTER — Encounter (HOSPITAL_COMMUNITY)
Admission: RE | Admit: 2023-04-16 | Discharge: 2023-04-16 | Disposition: A | Discharge status: Home / Self Care | Payer: Medicare Other | Source: Ambulatory Visit | Attending: Internal Medicine | Admitting: Internal Medicine

## 2023-04-16 DIAGNOSIS — J4550 Severe persistent asthma, uncomplicated: Secondary | ICD-10-CM

## 2023-04-16 NOTE — Addendum Note (Signed)
Encounter addended by: Essie Hart, RN on: 04/16/2023 11:19 AM  Actions taken: Flowsheet accepted

## 2023-04-17 NOTE — Progress Notes (Signed)
Discharge Progress Report  Patient Details  Name: [name] MRN: [id number] Date of Birth: 07/07/1932 Referring Provider:   Doristine Devoid Pulmonary Rehab Walk Test from 12/26/2022 in [facility]  Referring Provider Ramawamy        Number of Visits: 6  Reason for Discharge:  Patient reached a stable level of exercise. Patient independent in their exercise. Patient has met program and personal goals.  Smoking History:  Social History   Tobacco Use  Smoking Status Never  Smokeless Tobacco Never    Diagnosis:  Severe persistent asthma without complication  ADL UCSD:  Pulmonary Assessment Scores     Row Name 12/26/22 [id number] 04/07/23 0826       ADL UCSD   ADL Phase Entry Exit    SOB Score total 46 22      CAT Score   CAT Score 19 12      mMRC Score   mMRC Score 3 --             Initial Exercise Prescription:  Initial Exercise Prescription - 12/26/22 1000       Date of Initial Exercise RX and Referring Provider   Date 12/26/22    Referring Provider Ramawamy    Expected Discharge Date 03/26/23      NuStep   Level 1    SPM 50    Minutes 30    METs 1.5      Prescription Details   Frequency (times per week) 2    Duration Progress to 30 minutes of continuous aerobic without signs/symptoms of physical distress      Intensity   THRR 40-80% of Max Heartrate 53-106    Ratings of Perceived Exertion 11-13    Perceived Dyspnea 0-4      Progression   Progression Continue to progress workloads to maintain intensity without signs/symptoms of physical distress.      Resistance Training   Training Prescription Yes    Weight red bands    Reps 10-15             Discharge Exercise Prescription (Final Exercise Prescription Changes):  Exercise Prescription Changes - 04/07/23 0900       Response to Exercise   Blood Pressure (Admit) 142/70    Blood Pressure (Exercise) 190/70    Blood  Pressure (Exit) 138/70    Heart Rate (Admit) 79 bpm    Heart Rate (Exercise) 96 bpm    Heart Rate (Exit) 88 bpm    Oxygen Saturation (Admit) 97 %    Oxygen Saturation (Exercise) 96 %    Oxygen Saturation (Exit) 97 %    Rating of Perceived Exertion (Exercise) 13    Perceived Dyspnea (Exercise) 2    Duration Continue with 30 min of aerobic exercise without signs/symptoms of physical distress.    Intensity THRR unchanged      Resistance Training   Training Prescription Yes    Weight red bands    Reps 10-15    Time 10 Minutes      NuStep   Level 4    SPM 79    Minutes 8    METs 3.2      Track   Laps 12    Minutes 20    METs 2.38             Functional Capacity:  6 Minute Walk     Row Name 12/26/22 1042 04/16/23 1119  6 Minute Walk   Phase Initial Discharge    Distance 625 feet 966 feet    Distance % Change -- 54.56 %    Distance Feet Change -- 341 ft    Walk Time 6 minutes 6 minutes    # of Rest Breaks 2  3:10-3:37, 5:37-6:00 0    MPH 1.18 1.83    METS 0.83 1.5    RPE 13 11    Perceived Dyspnea  2 1    VO2 Peak 2.92 5.25    Symptoms No No    Resting HR 80 bpm 80 bpm    Resting BP 140/74 132/68    Resting Oxygen Saturation  100 % 96 %    Exercise Oxygen Saturation  during 6 min walk 95 % 96 %    Max Ex. HR 89 bpm 98 bpm    Max Ex. BP 150/80 148/62    2 Minute Post BP 148/78 136/60      Interval HR   1 Minute HR 82 90    2 Minute HR 86 93    3 Minute HR 86 93    4 Minute HR 85 95    5 Minute HR 89 94    6 Minute HR 89 98    2 Minute Post HR 85 85    Interval Heart Rate? Yes Yes      Interval Oxygen   Interval Oxygen? Yes Yes    Baseline Oxygen Saturation % 100 % 98 %    1 Minute Oxygen Saturation % 98 % 96 %    1 Minute Liters of Oxygen 0 L 0 L    2 Minute Oxygen Saturation % 95 % 96 %    2 Minute Liters of Oxygen 0 L 0 L    3 Minute Oxygen Saturation % 97 % 98 %    3 Minute Liters of Oxygen 0 L 0 L    4 Minute Oxygen Saturation % 99 %  96 %    4 Minute Liters of Oxygen 0 L 0 L    5 Minute Oxygen Saturation % 100 % 96 %    5 Minute Liters of Oxygen 0 L 0 L    6 Minute Oxygen Saturation % 96 % 100 %    6 Minute Liters of Oxygen 0 L 0 L    2 Minute Post Oxygen Saturation % 97 % 99 %    2 Minute Post Liters of Oxygen 0 L 0 L             Psychological, QOL, Others - Outcomes: PHQ 2/9:    04/07/2023    8:25 AM 12/26/2022    9:34 AM 01/01/2015   10:56 AM  Depression screen PHQ 2/9  Decreased Interest 0 0 0  Down, Depressed, Hopeless 0 0 0  PHQ - 2 Score 0 0 0  Altered sleeping 0 3   Tired, decreased energy 2 1   Change in appetite 1 0   Feeling bad or failure about yourself  0 0   Trouble concentrating 0 0   Moving slowly or fidgety/restless 0 0   Suicidal thoughts 0 0   PHQ-9 Score 3 4   Difficult doing work/chores Not difficult at all Somewhat difficult     Quality of Life:   Personal Goals: Goals established at orientation with interventions provided to work toward goal.  Personal Goals and Risk Factors at Admission - 12/26/22 1610  Core Components/Risk Factors/Patient Goals on Admission    Weight Management Weight Loss;Yes    Intervention Weight Management: Develop a combined nutrition and exercise program designed to reach desired caloric intake, while maintaining appropriate intake of nutrient and fiber, sodium and fats, and appropriate energy expenditure required for the weight goal.;Weight Management: Provide education and appropriate resources to help participant work on and attain dietary goals.;Weight Management/Obesity: Establish reasonable short term and long term weight goals.;Obesity: Provide education and appropriate resources to help participant work on and attain dietary goals.    Expected Outcomes Short Term: Continue to assess and modify interventions until short term weight is achieved;Long Term: Adherence to nutrition and physical activity/exercise program aimed toward attainment of  established weight goal;Weight Maintenance: Understanding of the daily nutrition guidelines, which includes 25-35% calories from fat, 7% or less cal from saturated fats, less than 200mg  cholesterol, less than 1.5gm of sodium, & 5 or more servings of fruits and vegetables daily;Weight Loss: Understanding of general recommendations for a balanced deficit meal plan, which promotes 1-2 lb weight loss per week and includes a negative energy balance of [phone] kcal/d;Understanding recommendations for meals to include 15-35% energy as protein, 25-35% energy from fat, 35-60% energy from carbohydrates, less than 200mg  of dietary cholesterol, 20-35 gm of total fiber daily;Understanding of distribution of calorie intake throughout the day with the consumption of 4-5 meals/snacks;Weight Gain: Understanding of general recommendations for a high calorie, high protein meal plan that promotes weight gain by distributing calorie intake throughout the day with the consumption for 4-5 meals, snacks, and/or supplements    Improve shortness of breath with ADL's Yes    Intervention Provide education, individualized exercise plan and daily activity instruction to help decrease symptoms of SOB with activities of daily living.    Expected Outcomes Short Term: Improve cardiorespiratory fitness to achieve a reduction of symptoms when performing ADLs;Long Term: Be able to perform more ADLs without symptoms or delay the onset of symptoms    Heart Failure Yes    Intervention Provide a combined exercise and nutrition program that is supplemented with education, support and counseling about heart failure. Directed toward relieving symptoms such as shortness of breath, decreased exercise tolerance, and extremity edema.    Expected Outcomes Improve functional capacity of life;Short term: Attendance in program 2-3 days a week with increased exercise capacity. Reported lower sodium intake. Reported increased fruit and vegetable intake. Reports  medication compliance.;Short term: Daily weights obtained and reported for increase. Utilizing diuretic protocols set by physician.;Long term: Adoption of self-care skills and reduction of barriers for early signs and symptoms recognition and intervention leading to self-care maintenance.              Personal Goals Discharge:  Goals and Risk Factor Review     Row Name 01/05/23 1351 02/02/23 1553 03/04/23 1432 04/01/23 0914 04/17/23 0847     Core Components/Risk Factors/Patient Goals Review   Personal Goals Review Weight Management/Obesity;Heart Failure;Improve shortness of breath with ADL's;Develop more efficient breathing techniques such as purse lipped breathing and diaphragmatic breathing and practicing self-pacing with activity. Weight Management/Obesity;Improve shortness of breath with ADL's;Heart Failure Weight Management/Obesity;Improve shortness of breath with ADL's;Heart Failure Weight Management/Obesity;Improve shortness of breath with ADL's Weight Management/Obesity;Improve shortness of breath with ADL's   Review Unable to evaluate progress yet. Emauri has attended 1 class so far. Goal progressing for weight loss. Seria has been working with our dietician for weight loss goals. Goal progressing on improving her shortness of breath with ADLs. Goal  met on developing more efficient breathing techniques such as purse lipped breathing and diaphragmatic breathing; and practicing self-pacing with activity. Fumi is able to demonstrtae purse lip breathing when she gets short of breath. She also knows how to pace herself to help with shortness of breath.  Goal progressing on heart failure. We will continue to monitor her progress throughout the program. Goal progressing for weight loss. Tateanna is down 2.9#'s since starting the program. She continues to work with the staff dietician to achieve her weight loss goals. Goal progressing on improving her shortness of breath with ADLs. She has  maintained O2 sats >88% on RA while exercising. Goal met on heart failure. No CHF exacerbations since she has been in [place].  We will continue to monitor her progress throughout the program. Goal progressing for weight loss. She continues to work with the staff dietitian to achieve her weight loss goals. Goal progressing on improving her shortness of breath with ADLs. She has maintained O2 sats >88% on RA while exercising. We will continue to monitor her progress throughout the program. Taqwa graduated the Pulmonary Rehab program on 04/16/2023 completing 29 sessions. Core components/risk factors/patient goals graduation re-evaluate are as follows: Goal met for weight loss. Julius lost ~2.1# since starting the program. She worked with our staff dietitian to achieve her weight loss goals. Goal met on improving her shortness of breath with ADLs. She has maintained O2 sats >88% on RA while exercising. We are proud of the success she has made in the program!   Expected Outcomes For Mailyn to lose weight, improve her shortness of breath with ADLs, develop more efficient breathing techniques, and have no HF exacerbations during her time in [place] See admission goals For Aleemah to lose weight and improve her shortness of breath with ADL's For Jola Babinski to lose weight and improve her shortness of breath with ADL's For Jola Babinski to lose weight and improve her shortness of breath with ADL's post graduation from [facility]            Exercise Goals and Review:  Exercise Goals     Row Name 12/26/22 1038 01/07/23 0946           Exercise Goals   Increase Physical Activity Yes Yes      Intervention Provide advice, education, support and counseling about physical activity/exercise needs.;Develop an individualized exercise prescription for aerobic and resistive training based on initial evaluation findings, risk stratification, comorbidities and participant's personal goals. Provide advice, education, support and  counseling about physical activity/exercise needs.;Develop an individualized exercise prescription for aerobic and resistive training based on initial evaluation findings, risk stratification, comorbidities and participant's personal goals.      Expected Outcomes Short Term: Attend rehab on a regular basis to increase amount of physical activity.;Long Term: Exercising regularly at least 3-5 days a week.;Long Term: Add in home exercise to make exercise part of routine and to increase amount of physical activity. Short Term: Attend rehab on a regular basis to increase amount of physical activity.;Long Term: Exercising regularly at least 3-5 days a week.;Long Term: Add in home exercise to make exercise part of routine and to increase amount of physical activity.      Increase Strength and Stamina Yes Yes      Intervention Provide advice, education, support and counseling about physical activity/exercise needs.;Develop an individualized exercise prescription for aerobic and resistive training based on initial evaluation findings, risk stratification, comorbidities and participant's personal goals. Provide advice, education, support and counseling about physical  activity/exercise needs.;Develop an individualized exercise prescription for aerobic and resistive training based on initial evaluation findings, risk stratification, comorbidities and participant's personal goals.      Expected Outcomes Short Term: Increase workloads from initial exercise prescription for resistance, speed, and METs.;Short Term: Perform resistance training exercises routinely during rehab and add in resistance training at home;Long Term: Improve cardiorespiratory fitness, muscular endurance and strength as measured by increased METs and functional capacity ( ) Short Term: Increase workloads from initial exercise prescription for resistance, speed, and METs.;Short Term: Perform resistance training exercises routinely during rehab and add  in resistance training at home;Long Term: Improve cardiorespiratory fitness, muscular endurance and strength as measured by increased METs and functional capacity ( )      Able to understand and use rate of perceived exertion (RPE) scale Yes Yes      Intervention Provide education and explanation on how to use RPE scale Provide education and explanation on how to use RPE scale      Expected Outcomes Short Term: Able to use RPE daily in rehab to express subjective intensity level;Long Term:  Able to use RPE to guide intensity level when exercising independently Short Term: Able to use RPE daily in rehab to express subjective intensity level;Long Term:  Able to use RPE to guide intensity level when exercising independently      Able to understand and use Dyspnea scale Yes Yes      Intervention Provide education and explanation on how to use Dyspnea scale Provide education and explanation on how to use Dyspnea scale      Expected Outcomes Short Term: Able to use Dyspnea scale daily in rehab to express subjective sense of shortness of breath during exertion;Long Term: Able to use Dyspnea scale to guide intensity level when exercising independently Short Term: Able to use Dyspnea scale daily in rehab to express subjective sense of shortness of breath during exertion;Long Term: Able to use Dyspnea scale to guide intensity level when exercising independently      Knowledge and understanding of Target Heart Rate Range (THRR) Yes Yes      Intervention Provide education and explanation of THRR including how the numbers were predicted and where they are located for reference Provide education and explanation of THRR including how the numbers were predicted and where they are located for reference      Expected Outcomes Short Term: Able to state/look up THRR;Long Term: Able to use THRR to govern intensity when exercising independently;Short Term: Able to use daily as guideline for intensity in rehab Short Term: Able  to state/look up THRR;Long Term: Able to use THRR to govern intensity when exercising independently;Short Term: Able to use daily as guideline for intensity in rehab      Understanding of Exercise Prescription Yes Yes      Intervention Provide education, explanation, and written materials on patient's individual exercise prescription Provide education, explanation, and written materials on patient's individual exercise prescription      Expected Outcomes Short Term: Able to explain program exercise prescription;Long Term: Able to explain home exercise prescription to exercise independently Short Term: Able to explain program exercise prescription;Long Term: Able to explain home exercise prescription to exercise independently               Exercise Goals Re-Evaluation:  Exercise Goals Re-Evaluation     Row Name 01/07/23 0946 02/04/23 0754 03/03/23 1617 04/01/23 1211 04/16/23 1540     Exercise Goal Re-Evaluation   Exercise Goals Review Increase Physical Activity;Able to  understand and use Dyspnea scale;Understanding of Exercise Prescription;Increase Strength and Stamina;Knowledge and understanding of Target Heart Rate Range (THRR);Able to understand and use rate of perceived exertion (RPE) scale Increase Physical Activity;Able to understand and use Dyspnea scale;Understanding of Exercise Prescription;Increase Strength and Stamina;Knowledge and understanding of Target Heart Rate Range (THRR);Able to understand and use rate of perceived exertion (RPE) scale Increase Physical Activity;Able to understand and use Dyspnea scale;Understanding of Exercise Prescription;Increase Strength and Stamina;Knowledge and understanding of Target Heart Rate Range (THRR);Able to understand and use rate of perceived exertion (RPE) scale Increase Physical Activity;Able to understand and use Dyspnea scale;Understanding of Exercise Prescription;Increase Strength and Stamina;Knowledge and understanding of Target Heart Rate  Range (THRR);Able to understand and use rate of perceived exertion (RPE) scale Increase Physical Activity;Able to understand and use Dyspnea scale;Understanding of Exercise Prescription;Increase Strength and Stamina;Knowledge and understanding of Target Heart Rate Range (THRR);Able to understand and use rate of perceived exertion (RPE) scale   Comments Juanice has completed 2 exercise sessions. She exercises for 30 min on the Nustep. Amberleigh averages 2.2 METs at level 1 on the Nustep. She performs the warmup and cooldown standing holding onto a chair with verbal cues. I am considering progressing her to 2x15 min exercise modes. Will discuss with pt next exercise session. It is too soon to note any discernable progressions. Will continue to monitor and progress as able. Travis has completed 10 exercise sessions. She exercises for 15 min on the Nustep and track. She averages 1.9 METs at level 2 on the Nustep and 2.23 METs on the track. Ashleynicole performs the warmup and cooldown standing holding onto a chair for balance. She has progressed to walking the track for 15 min. This helps with her chronic hip pain. She tolerates the track well and takes rest breaks as needed. Will continue to monitor and progress as able. Waneta has completed 17 exercise sessions. She exercises for 15 min on the Nustep and track. She averages 3.3 METs at level 4 on the Nustep and 2.23 METs on the track. Ticia performs the warmup and cooldown standing holding onto a chair for balance. Fay has increased her level on the Nustep as METs have also increased. She feels she has benefited from the exercise. We have recently discussed home exercise. I encouraged Aurel to start walking at home. Will continue to monitor and progress as able. Akeria has completed 25 exercise sessions. She exercises for 15 min on the Nustep and track. She averages 3.4 METs at level 3 on the Nustep and 2.85 METs on the track. Sherissa performs the warmup and  cooldown standing holding onto a chair for balance. Skyanna had to decrease her workload on the Nustep due to right hip pain. She was able to significantly increase her lap count on the track. [place]il[place] feels she has improved since starting rehab. Will continue to monitor and progress as able. Mikita has completed 29 exercise sessions. Her peak METs were 3.7 on the Nustep and 2.9 on the track. We discussed exercising at home as Anayely plans to start walking with her husband outside and at the grocery store. I encouraged her to start soon so that she does not lose what she has gained. Vallorie voiced understanding.   Expected Outcomes Through exercise at rehab and home, the patient will decrease shortness of breath with daily activities and feel confident in carrying out an exercise regimen at home. Through exercise at rehab and home, the patient will decrease shortness of breath with daily activities and  feel confident in carrying out an exercise regimen at home. Through exercise at rehab and home, the patient will decrease shortness of breath with daily activities and feel confident in carrying out an exercise regimen at home. Through exercise at rehab and home, the patient will decrease shortness of breath with daily activities and feel confident in carrying out an exercise regimen at home. Through exercise at rehab and home, the patient will decrease shortness of breath with daily activities and feel confident in carrying out an exercise regimen at home.            Nutrition & Weight - Outcomes:    Nutrition:  Nutrition Therapy & Goals - 04/02/23 1132       Nutrition Therapy   Diet General Healthy Diet    Drug/Food Interactions Statins/Certain Fruits      Personal Nutrition Goals   Nutrition Goal Patient to continue diet quality by using the plate method as a guide for meal planning to include lean protein/plant protein, fruits, vegetables, whole grains, nonfat dairy as part of a  well-balanced diet.   goal in progress.   Comments Goal in progess. Akiba reports eating wide variety of foods focused on heart healthy cooking methods, fruit/vegetable intake, and lean protein sources. She does eat out on occasion and eats three meals daily. She is down 2.2# since orientation weight; weight remains appropriate for age. Her husband remains a good support. Patient will benefit from participation in pulmonary rehab for nutrition, exercise, and lifestyle modification.      Intervention Plan   Intervention Prescribe, educate and counsel regarding individualized specific dietary modifications aiming towards targeted core components such as weight, hypertension, lipid management, diabetes, heart failure and other comorbidities.;Nutrition handout(s) given to patient.    Expected Outcomes Short Term Goal: Understand basic principles of dietary content, such as calories, fat, sodium, cholesterol and nutrients.;Long Term Goal: Adherence to prescribed nutrition plan.             Nutrition Discharge:  Nutrition Assessments - 04/07/23 0823       Rate Your Plate Scores   Post Score 60             Education Questionnaire Score:  Knowledge Questionnaire Score - 04/07/23 0826       Knowledge Questionnaire Score   Post Score 16/18            Ladaijah graduated the Pulmonary Rehab program on 04/16/2023 completing 29 sessions. At the time of discharge, Shakeema denied any psychosocial barriers or concerns. She says she has good support from her husband and church family.   Core components/risk factors/patient goals graduation re-evaluate are as follows: Goal met for weight loss. Kaitlynd lost ~2.1# since starting the program. She worked with our staff dietitian to achieve her weight loss goals. Goal met on improving her shortness of breath with ADLs. She has maintained O2 sats >88% on RA while exercising. We are proud of the success she has made in the program!   Goals reviewed  with patient; copy given to patient.

## 2023-05-25 ENCOUNTER — Telehealth: Payer: Self-pay | Admitting: Pharmacist

## 2023-05-25 NOTE — Telephone Encounter (Signed)
 Received a fax from  [employer] regarding an approval for [employer]  patient assistance from 05/25/2023 to 05/11/2024. Approval letter sent to scan center.  Phone #: [phone] Fax #: [phone] Ref # [id number]  Sherry Pennant, PharmD, MPH, BCPS, CPP Clinical Pharmacist (Rheumatology and Pulmonology)

## 2023-08-14 ENCOUNTER — Ambulatory Visit (HOSPITAL_BASED_OUTPATIENT_CLINIC_OR_DEPARTMENT_OTHER)
Admission: RE | Admit: 2023-08-14 | Discharge: 2023-08-14 | Disposition: A | Discharge status: Home / Self Care | Source: Ambulatory Visit | Attending: Orthopaedic Surgery | Admitting: Orthopaedic Surgery

## 2023-08-14 ENCOUNTER — Other Ambulatory Visit (HOSPITAL_COMMUNITY): Payer: Self-pay | Admitting: Orthopaedic Surgery

## 2023-08-14 ENCOUNTER — Ambulatory Visit (HOSPITAL_BASED_OUTPATIENT_CLINIC_OR_DEPARTMENT_OTHER)
Admission: RE | Admit: 2023-08-14 | Discharge: 2023-08-14 | Disposition: A | Discharge status: Home / Self Care | Source: Ambulatory Visit | Attending: Cardiology | Admitting: Cardiology

## 2023-08-14 DIAGNOSIS — L03116 Cellulitis of left lower limb: Secondary | ICD-10-CM

## 2023-08-14 DIAGNOSIS — M79661 Pain in right lower leg: Secondary | ICD-10-CM | POA: Insufficient documentation

## 2023-08-14 DIAGNOSIS — M79662 Pain in left lower leg: Secondary | ICD-10-CM | POA: Insufficient documentation

## 2023-08-14 DIAGNOSIS — M7989 Other specified soft tissue disorders: Secondary | ICD-10-CM | POA: Insufficient documentation

## 2023-08-14 MED ORDER — GADOBUTROL 1 MMOL/ML IV SOLN
7.0000 mL | Freq: Once | INTRAVENOUS | Status: AC | PRN
  Administered 2023-08-14: 7 mL via INTRAVENOUS
  Filled 2023-08-14: qty 7.5

## 2023-08-16 NOTE — Progress Notes (Signed)
 "    Cardiology Office Note:    Date:  08/17/2023  ID:  Neville Dural, DOB 1932/05/17, MRN [id number] PCP: Shayne Anes, MD  [facility] Providers Cardiologist:  Aleene Passe, MD       Patient Profile:      Chest pain  Cath 12/22/2006: no CAD Myoview  06/21/2018: EF 67, no ischemia or infarction; low risk Coronary calcification on CT scan (HFpEF) heart failure with preserved ejection fraction  TTE 12/23/2018: EF 55-60, no RWMA, G1 DD, normal RVSF, mild LAE, mild to moderate MAC, mild calcification of the AV TTE 09/17/2022: EF 60-65, no RWMA, mild asymmetric LVH, normal RVSF, trivial MR Hypertension  Hyperlipidemia  Asthma Breast CA s/p R mastectomy 1989, L mastectomy 1990 Recurrent DVT, chronic anticoagulation  Long COVID (fatigue)        Discussed the use of AI scribe software for clinical note transcription with the patient, who gave verbal consent to proceed.  History of Present Illness [name] is a 89 y.o. female who returns for follow up of CHF, coronary artery Ca2+. She was last seen in 08/2022.   She is here alone.  She experiences occasional shortness of breath, primarily attributed to her asthma. She completed pulmonary rehab last fall, which improved her symptoms, but she has not maintained regular exercise due to other commitments. She experiences occasional chest tightness and has nitroglycerin  prescribed as needed, though she has not used it recently. She recently was seen by orthopedics for left ankle pain and swelling.  She had an ultrasound of her left leg which showed a chronic DVT but no acute DVT. She underwent an MRI for her swollen and painful ankle, and she had an aspiration. She has been taking Keflex for a suspected infection, which has improved the redness and swelling.   Review of Systems  Musculoskeletal:  Positive for joint pain and joint swelling.  -See HPI    Studies Reviewed:   EKG Interpretation Date/Time:  Monday August 17 2023 11:24:09  EDT Ventricular Rate:  88 PR Interval:  174 QRS Duration:  128 QT Interval:  386 QTC Calculation: 467 R Axis:   -31  Text Interpretation: Normal sinus rhythm Left axis deviation Left bundle branch block No significant change since last tracing Confirmed by Lelon Hamilton [phone]) on 08/17/2023 11:28:03 AM    Results LABS Total cholesterol: 197 (08/19/2022) HDL: 103 (08/19/2022) LDL: 83 (08/19/2022) Triglycerides: 54 (08/19/2022)    Risk Assessment/Calculations:     HYPERTENSION CONTROL Vitals:   08/17/23 1120 08/17/23 1201  BP: (!) 146/60 (!) 140/70    The patient's blood pressure is elevated above target today.  In order to address the patient's elevated BP: Blood pressure will be monitored at home to determine if medication changes need to be made.          Physical Exam:   VS:  BP (!) 140/70   Pulse 88   Ht 5' 3 (1.6 m)   Wt 130 lb 12.8 oz (59.3 kg)   SpO2 92%   BMI 23.17 kg/m    Wt Readings from Last 3 Encounters:  08/17/23 130 lb 12.8 oz (59.3 kg)  04/07/23 142 lb 10.2 oz (64.7 kg)  03/24/23 143 lb 15.4 oz (65.3 kg)    Constitutional:      Appearance: Healthy appearance. Not in distress.  Neck:     Vascular: JVD normal.  Pulmonary:     Breath sounds: Diffuse Wheezing present. No rales.  Cardiovascular:     Normal rate.  Regular rhythm.     Murmurs: There is a grade 2/6 systolic murmur at the URSB and ULSB.  Edema:    Peripheral edema present.    Ankle: 1+ edema of the left ankle. Abdominal:     Palpations: Abdomen is soft.         Assessment and Plan:   Assessment & Plan Chronic heart failure with preserved ejection fraction (HFpEF) (HCC) EF 60-65 by TTE in 09/2022. She is NYHA Class 2B. Shortness of breath primarily due to asthma. Volume status is stable. No changes to current management as symptoms are well-managed. - Continue Lasix  20 mg daily - Continue Potassium 10 mEq daily - Obtain follow-up CMET today - Follow up in one year Coronary  artery disease involving native coronary artery of native heart with other form of angina pectoris (HCC) Coronary artery calcification with a negative nuclear stress test in 2020 for ischemia or infarction. Occasional chest discomfort without significant change over the past year.  She has not used nitroglycerin . No further testing needed at this time. - Continue nitroglycerin  PRN - Continue Crestor  5 mg daily - Continue telmisartan  20 mg daily - Consider nondihydropyridine calcium  channel blocker (Diltiazem) if symptoms become more consistent with angina (management for chronic microvascular disease) - Avoid non-selective beta blockers due to asthma Essential hypertension Blood pressure slightly elevated today, likely due to ongoing L ankle pain.  Her BP typically runs in the 130s. Advised to monitor blood pressure at home and report if consistently elevated. - Monitor blood pressure at home - Continue telmisartan  20 mg daily - She is to call if blood pressure consistently above 140/90 Pure hypercholesterolemia Labs in 08/2022 with total cholesterol 197, HDL 103, LDL 83, triglycerides 54.   - Obtain follow-up CMET - Obtain fasting lipopanel - Continue Crestor  5 mg daily History of DVT of lower extremity She had a recent ultrasound of her left leg to evaluate her left ankle swelling and pain which showed evidence of chronic DVT but no acute DVT.  She remains on chronic anticoagulation with Xarelto  10 mg daily. Continue follow-up with orthopedics and primary care as directed      Dispo:  Return in about 1 year (around 08/16/2024) for Routine Follow Up, w/ Glendia Ferrier, PA-C.  Signed, Glendia Ferrier, PA-C   "

## 2023-08-17 ENCOUNTER — Other Ambulatory Visit (HOSPITAL_COMMUNITY): Payer: Self-pay | Admitting: *Deleted

## 2023-08-17 ENCOUNTER — Ambulatory Visit: Payer: Medicare Other | Attending: Physician Assistant | Admitting: Physician Assistant

## 2023-08-17 ENCOUNTER — Encounter: Payer: Self-pay | Admitting: Physician Assistant

## 2023-08-17 ENCOUNTER — Other Ambulatory Visit: Payer: Self-pay | Admitting: Orthopaedic Surgery

## 2023-08-17 VITALS — BP 140/70 | HR 88 | Ht 63.0 in | Wt 130.8 lb

## 2023-08-17 DIAGNOSIS — Z86718 Personal history of other venous thrombosis and embolism: Secondary | ICD-10-CM

## 2023-08-17 DIAGNOSIS — I25118 Atherosclerotic heart disease of native coronary artery with other forms of angina pectoris: Secondary | ICD-10-CM

## 2023-08-17 DIAGNOSIS — I5032 Chronic diastolic (congestive) heart failure: Secondary | ICD-10-CM | POA: Diagnosis not present

## 2023-08-17 DIAGNOSIS — I10 Essential (primary) hypertension: Secondary | ICD-10-CM | POA: Diagnosis not present

## 2023-08-17 DIAGNOSIS — L03116 Cellulitis of left lower limb: Secondary | ICD-10-CM

## 2023-08-17 DIAGNOSIS — E7800 Pure hypercholesterolemia, unspecified: Secondary | ICD-10-CM | POA: Diagnosis not present

## 2023-08-17 NOTE — Assessment & Plan Note (Signed)
 Blood pressure slightly elevated today, likely due to ongoing L ankle pain.  Her BP typically runs in the 130s. Advised to monitor blood pressure at home and report if consistently elevated. - Monitor blood pressure at home - Continue telmisartan 20 mg daily - She is to call if blood pressure consistently above 140/90

## 2023-08-17 NOTE — Patient Instructions (Addendum)
 Medication Instructions:  Your physician recommends that you continue on your current medications as directed. Please refer to the Current Medication list given to you today.  *If you need a refill on your cardiac medications before your next appointment, please call your pharmacy*  Lab Work: TODAY:  CMET & LIPID  If you have labs (blood work) drawn today and your tests are completely normal, you will receive your results only by: MyChart Message (if you have MyChart) OR A paper copy in the mail If you have any lab test that is abnormal or we need to change your treatment, we will call you to review the results.  Testing/Procedures: None ordered   Follow-Up: At [facility], you and your health needs are our priority.  As part of our continuing mission to provide you with exceptional heart care, our providers are all part of one team.  This team includes your primary Cardiologist (physician) and Advanced Practice Providers or APPs (Physician Assistants and Nurse Practitioners) who all work together to provide you with the care you need, when you need it.  Your next appointment:   12 month(s)  Provider:   Kristeen Miss, MD  or Tereso Newcomer, PA-C         We recommend signing up for the patient portal called "MyChart".  Sign up information is provided on this After Visit Summary.  MyChart is used to connect with patients for Virtual Visits (Telemedicine).  Patients are able to view lab/test results, encounter notes, upcoming appointments, etc.  Non-urgent messages can be sent to your provider as well.   To learn more about what you can do with MyChart, go to [email or web address].   Other Instructions       1st Floor: - Lobby - Registration  - Pharmacy  - Lab - Cafe  2nd Floor: - PV Lab - Diagnostic Testing (echo, CT, nuclear med)  3rd Floor: - Vacant  4th Floor: - TCTS (cardiothoracic surgery) - AFib Clinic - Structural Heart Clinic - Vascular Surgery  -  Vascular Ultrasound  5th Floor: - HeartCare Cardiology (general and EP) - Clinical Pharmacy for coumadin, hypertension, lipid, weight-loss medications, and med management appointments    Valet parking services will be available as well.

## 2023-08-17 NOTE — Assessment & Plan Note (Signed)
 Labs in 08/2022 with total cholesterol 197, HDL 103, LDL 83, triglycerides 54.   - Obtain follow-up CMET - Obtain fasting lipopanel - Continue Crestor 5 mg daily

## 2023-08-17 NOTE — Assessment & Plan Note (Signed)
 EF 60-65 by TTE in 09/2022. She is NYHA Class 2B. Shortness of breath primarily due to asthma. Volume status is stable. No changes to current management as symptoms are well-managed. - Continue Lasix 20 mg daily - Continue Potassium 10 mEq daily - Obtain follow-up CMET today - Follow up in one year

## 2023-08-17 NOTE — Assessment & Plan Note (Signed)
 She had a recent ultrasound of her left leg to evaluate her left ankle swelling and pain which showed evidence of chronic DVT but no acute DVT.  She remains on chronic anticoagulation with Xarelto 10 mg daily. Continue follow-up with orthopedics and primary care as directed

## 2023-08-17 NOTE — Assessment & Plan Note (Signed)
 Coronary artery calcification with a negative nuclear stress test in 2020 for ischemia or infarction. Occasional chest discomfort without significant change over the past year.  She has not used nitroglycerin. No further testing needed at this time. - Continue nitroglycerin PRN - Continue Crestor 5 mg daily - Continue telmisartan 20 mg daily - Consider nondihydropyridine calcium channel blocker (Diltiazem) if symptoms become more consistent with angina (management for chronic microvascular disease) - Avoid non-selective beta blockers due to asthma

## 2023-08-18 ENCOUNTER — Telehealth: Payer: Self-pay | Admitting: *Deleted

## 2023-08-18 ENCOUNTER — Inpatient Hospital Stay (HOSPITAL_COMMUNITY): Admission: RE | Admit: 2023-08-18 | Source: Ambulatory Visit

## 2023-08-18 LAB — COMPREHENSIVE METABOLIC PANEL WITH GFR
ALT: 18 IU/L (ref 0–32)
AST: 22 IU/L (ref 0–40)
Albumin: 4.1 g/dL (ref 3.7–4.7)
Alkaline Phosphatase: 93 IU/L (ref 44–121)
BUN/Creatinine Ratio: 15 (ref 12–28)
BUN: 29 mg/dL — ABNORMAL HIGH (ref 8–27)
Bilirubin Total: 0.3 mg/dL (ref 0.0–1.2)
CO2: 25 mmol/L (ref 20–29)
Calcium: 9.9 mg/dL (ref 8.7–10.3)
Chloride: 96 mmol/L (ref 96–106)
Creatinine, Ser: 1.94 mg/dL — ABNORMAL HIGH (ref 0.57–1.00)
Globulin, Total: 2.6 g/dL (ref 1.5–4.5)
Glucose: 116 mg/dL — ABNORMAL HIGH (ref 70–99)
Potassium: 4.7 mmol/L (ref 3.5–5.2)
Sodium: 139 mmol/L (ref 134–144)
Total Protein: 6.7 g/dL (ref 6.0–8.5)
eGFR: 24 mL/min/{1.73_m2} — ABNORMAL LOW (ref >59)

## 2023-08-18 LAB — LIPID PANEL
Chol/HDL Ratio: 1.9 ratio (ref 0.0–4.4)
Cholesterol, Total: 193 mg/dL (ref 100–199)
HDL: 104 mg/dL (ref >39)
LDL Chol Calc (NIH): 76 mg/dL (ref 0–99)
Triglycerides: 75 mg/dL (ref 0–149)
VLDL Cholesterol Cal: 13 mg/dL (ref 5–40)

## 2023-08-18 MED ORDER — FUROSEMIDE 20 MG PO TABS: 20.0000 mg | ORAL_TABLET | ORAL | 3 refills | Status: AC

## 2023-08-18 MED ORDER — POTASSIUM CHLORIDE ER 10 MEQ PO TBCR: 10.0000 meq | EXTENDED_RELEASE_TABLET | ORAL | 3 refills | Status: DC

## 2023-08-18 NOTE — Telephone Encounter (Signed)
-----   Message from Tereso Newcomer sent at 08/18/2023  7:56 AM EDT ----- Results sent to [facility] via MyChart. See MyChart comments below. PLAN:  -Hold Telmisartan, Lasix, K+ x 1 day -Then resume Telmisartan 20 mg once daily -Resume Lasix 20 mg and K+ 10 mEq on every Mon, Wed, Fri only. -BMET 1-2 weeks  [name]  Your creatinine (kidney function) has increased. Your potassium, liver enzymes (AST, ALT) are normal. Your cholesterol is optimal. I would like you to not take Telmisartan, Furosemide (Lasix) and potassium for 1 day. Then, you can resume Telmisartan 20 mg once daily. You can restart furosemide (Lasix) 20 mg and potassium 10 mEq by taking it every Mon, Wed, Fri only. I will repeat your labs in 1-2 weeks. Tereso Newcomer, PA-C

## 2023-08-31 ENCOUNTER — Other Ambulatory Visit: Payer: Self-pay

## 2023-08-31 ENCOUNTER — Telehealth: Payer: Self-pay

## 2023-08-31 DIAGNOSIS — Z79899 Other long term (current) drug therapy: Secondary | ICD-10-CM

## 2023-08-31 NOTE — Telephone Encounter (Signed)
 [facility] calling to release labs

## 2023-09-01 LAB — BASIC METABOLIC PANEL WITH GFR
BUN/Creatinine Ratio: 19 (ref 12–28)
BUN: 31 mg/dL — ABNORMAL HIGH (ref 8–27)
CO2: 26 mmol/L (ref 20–29)
Calcium: 9.8 mg/dL (ref 8.7–10.3)
Chloride: 100 mmol/L (ref 96–106)
Creatinine, Ser: 1.64 mg/dL — ABNORMAL HIGH (ref 0.57–1.00)
Glucose: 97 mg/dL (ref 70–99)
Potassium: 5.4 mmol/L — ABNORMAL HIGH (ref 3.5–5.2)
Sodium: 140 mmol/L (ref 134–144)
eGFR: 30 mL/min/{1.73_m2} — ABNORMAL LOW (ref >59)

## 2023-09-02 ENCOUNTER — Telehealth: Payer: Self-pay | Admitting: *Deleted

## 2023-09-02 ENCOUNTER — Other Ambulatory Visit: Payer: Self-pay | Admitting: *Deleted

## 2023-09-02 DIAGNOSIS — Z79899 Other long term (current) drug therapy: Secondary | ICD-10-CM

## 2023-09-02 NOTE — Telephone Encounter (Signed)
-----   Message from Marlyse Single sent at 09/01/2023  5:31 PM EDT ----- Results sent to [facility] via MyChart. See MyChart comments below. PLAN:  - DC K+ - Repeat BMET 2 weeks  Ms. Manthey  Your kidney function (creatinine, BUN, eGFR) is improved/stable.  Your potassium is elevated.  You can stop taking the potassium supplement.  I will repeat your labs again in 2 weeks to make sure your potassium is normal. Marlyse Single, PA-C

## 2023-09-16 LAB — BASIC METABOLIC PANEL WITH GFR
BUN/Creatinine Ratio: 16 (ref 12–28)
BUN: 23 mg/dL (ref 8–27)
CO2: 27 mmol/L (ref 20–29)
Calcium: 9.6 mg/dL (ref 8.7–10.3)
Chloride: 100 mmol/L (ref 96–106)
Creatinine, Ser: 1.42 mg/dL — ABNORMAL HIGH (ref 0.57–1.00)
Glucose: 92 mg/dL (ref 70–99)
Potassium: 4.2 mmol/L (ref 3.5–5.2)
Sodium: 142 mmol/L (ref 134–144)
eGFR: 36 mL/min/{1.73_m2} — ABNORMAL LOW (ref >59)

## 2023-09-21 ENCOUNTER — Other Ambulatory Visit: Payer: Self-pay | Admitting: Pharmacist

## 2023-09-21 DIAGNOSIS — J4550 Severe persistent asthma, uncomplicated: Secondary | ICD-10-CM

## 2023-09-21 MED ORDER — DUPIXENT 200 MG/1.14ML ~~LOC~~ SOSY: 200.0000 mg | PREFILLED_SYRINGE | SUBCUTANEOUS | 0 refills | Status: DC

## 2023-10-27 ENCOUNTER — Ambulatory Visit: Admitting: Internal Medicine

## 2023-10-27 ENCOUNTER — Encounter: Payer: Self-pay | Admitting: Internal Medicine

## 2023-10-27 VITALS — BP 158/64 | HR 80 | Ht 63.0 in | Wt 133.8 lb

## 2023-10-27 DIAGNOSIS — R0982 Postnasal drip: Secondary | ICD-10-CM | POA: Diagnosis not present

## 2023-10-27 DIAGNOSIS — J4550 Severe persistent asthma, uncomplicated: Secondary | ICD-10-CM | POA: Diagnosis not present

## 2023-10-27 NOTE — Progress Notes (Addendum)
 C.C.:  Follow-up for Severe, Persistent Asthma, Chronic Allergic Rhinitis, DVT, & GERD w/ Hiatal Hernia.  [name] Patient has been seen twice in our office since her last appointment with me. Last visit was on 9/26 with nurse practitioner. Patient was treated for an exacerbation of her asthma with bronchitis. She was given a prednisone  taper, instructed to use Mucinex  twice daily as needed, and a 7 day course of Augmentin . Since her last appointment she fractured her left hip. She is continuing to walk with a cane. She reports no adverse effects from her Xolair . She does feel the Xolair  is helping her significantly. Hasn't needed her rescue inhaler significantly in the last few weeks. Only rare nocturnal awakenings with any dyspnea.   Severe, persistent asthma: Patient currently maintained on a regimen of Xolair , Singulair , Spiriva , and Symbicort . She feels her dyspnea has significantly improved. She still has a scratchy throat & hoarse voice. She has her baseline nonproductive cough. No wheezing.  Chronic allergic rhinitis: Previously referred to ENT. Regimen at last appointment with me included Allegra , Singulair , Nasonex , and Xolair . Patient has had multiple different antibiotic courses for recurrent sinusitis. Only having intermittent sinus congestion & drainage.   DVT: Present in bilateral lower extremities. Patient on systemic anticoagulation with Xarelto  indefinitely. No melena or hematochezia. No hematuria.   GERD with hiatal hernia: Previously prescribed Nexium . No reflux or dyspepsia. No morning brash water  taste.   Review of Systems  No chest pain or tightness. No fever or chills. No abdominal pain or nausea.    OV 06/15/2017  Chief Complaint  Patient presents with   Follow-up    flare ups with asthma when the weather changes.  Right now it is a cough, with congestions and wheezing. Non-productive dry cough. SOB with any activity.  Hoarseness.   89 year old female.  Transfer of  care from Dr. Lyndal Sandy who is left the practice.  Moderate persistent asthma with an elevated IgE.  She is here with her husband.  She tells me that in 2015-2017 she was started on Xolair  and then subsequently switched in [place] last year or year before last.  This then caused nonspecific side effect such as erythema in her face and nonspecific pain that reminded her of shingles.  Therefore she discontinued this and the side effects resolved.  She did have 6 doses of the interleukin-5 receptor antibody.  She went back on Xolair .  Overall she feels that since starting Xolair  her quality of life and asthma exacerbations have improved but nevertheless she still gets asthma exacerbations every few months and has to go on prednisone .  Currently she states that for the last few to several days she has had increased cough although no change in sputum.  She is also wheezing a little bit more than usual.  Asthma control questionnaire shows for the last week she is waking up a few times at night.  When she wakes up she has moderate symptoms.  Her activities are slightly limited because of asthma.  She is moderate amount of shortness of breath because of asthma and she is wheezing a little of the time and using albuterol  for rescue at least 1 or 2 times daily.  Exam nitric oxide  is significantly elevated at 125 ppb.  She think she will benefit from prednisone   She she reports to me for the first time a new issue of left anterior cervical lymph node/submandibular lymph node.  She says it is been present for a while but other  physicians were examined it have not felt it.  She asked me to examine it.  She feels it is slowly growing.  Insidious onset for a year.   OV 08/13/2017  Chief Complaint  Patient presents with   Follow-up    Pt states she has had many flare-ups with her asthma. States she has had a lot of sinus drainage and congestion and a lot of chest tightness.    89 year old female with complicated asthma    poor control of asthma continues.  At my last visit she did a prednisone  taper.  Then a few weeks ago her sinuses acted up and therefore her asthma acted up and she did another prednisone  taper on herself at home but it was just for a few days.  She continues on Spiriva , Symbicort , Singulair , Nexium  and Xolair  injections.  She feels that ever since Dr. Arlene [name] retired her asthma has been out of control.  Currently for the last few weeks her sinuses have been acting up and she wants antibiotics.  In particular she wants Augmentin .  She says in the past this was deemed as an allergy  but she went and saw some doctor in [place] and it looks like she has been desensitized.  Penicillin is no longer listed as an allergy .  She insists that she wants Augmentin .  Review of the chart shows she has had chronic sinusitis and a CT scan of the sinus 1 year ago in February 2018.  She says she has seen ENT for this.  No surgery has been recommended.  Just nasal hygiene and antibiotic courses.  Currently her symptoms are quite significant with asthma control question of greater than 2 it appears this might all just be her baseline.  Her exam nitric oxide  is still high over 100.  At night she is waking up several times.  When she wakes up she has moderate symptoms.  Activities are slightly limited because of asthma.  She has moderate amount of shortness of breath because of asthma.  She is wheezing hardly and she uses albuterol  for rescue at least 1 or 2 times daily.   OV  Chief Complaint  Patient presents with   Follow-up    Pt states her breathing has been much better since she began new injection but states she is coughing more than before. Denies any CP.    Follow-up severe persistent asthma with eosinophilia: In April 2019 we started dupulimab and stop the Xolair . With this asthma control is significantly better. Her asthma control questionnaire average score has drop to 0.6. Her exhaled nitric oxide  is  drop from 100s to 43. She says she is much less short of breath and is able to do a lot more things. She is very pleased with the new injectable. However she feels that her baseline chronic cough is slightly worse. It is moderate in intensity is dry. It does not wake up at night. It is not associated with wheezing or shortness of breath. It is associated with Spiriva  MDI. She does clear her throat. Coughing is made worse by talking. Off note, she has new issue of left hip surgery coming up in 2 days. She feels from a pulmonary standpoint she will be stable to do the surgery because of prior experience with surgery.        OV 03/08/2018  Subjective:  Patient ID: [name], female , DOB: [birth year] , age 68 y.o. , MRN: [id number] , ADDRESS: [address] [place] [address]  03/08/2018 -   Chief Complaint  Patient presents with   Follow-up    Sinus draniage and more cough    [name] [name] 89 y.o. -returns for asthma follow-up.  However the issue today is that she reports worsening of sinus drainage for the last 1 month ever since the fall weather change.  He says the sinus drainage is so significant that it is making her cough.  She feels asthma itself is under control.  In fact exam nitric oxide  today is 38.  She is continuing inhalers Singulair  Nexium  and injection dupilumab .  She refused flu shot because she is allergic to it.  Review of the chart indicates March 2018 Dr. Lyndal Sandy did a CT scan of the sinus and it showed pansinusitis.  She is says she is seen ENT from 2 years ago and is unclear if it was before the scan or after the scan.  At this point in time she just wants relief from her current sinus issue.  The sinus drainage is possibly clear but she has not looked at it.  There is no fever worsening wheeze or chest tightness.  05/17/2018 Patient presents today with acute complaints of cough and congestion. Accompanied by husband. Productive cough with clear mucus since  05/05/18. Husband and brother were both sick. She was seen at [facility] on 12/29 and given steroid injection and amoxicillin  course. Finished antibiotic 2 days ago. Did not notice much of an improvement, if any. She has taken mucinex  and delsym  for cough. Hasn't had to use rescue inhaler or nebulizer. Due for Dupixent  injection on 05/25/18. Did not receive flu injection d/t allergy . Afebrile.   05/25/2018 Patient presents for 1 week follow-up for asthma exacerbation. CXR showed no acute cardiopulmonary disease. Treated with prednisone  taper. Due for dupixent  injection today. Feeling better than she did last week, still has a cough but sputum has cleared. Felt better yesterday, rain made her symptoms worse. States anytime the weather changes her asthma flares. Using nebulizer as needed, didn't use it as much yesterday. Required a treatment this morning. Takes Singulair  and allegra . Afebrile.       OV 06/08/2018  Subjective:  Patient ID: [name], female , DOB: 1932/06/04 , age 68 y.o. , MRN: [id number] , ADDRESS: [address] [place] [address]   06/08/2018 -   Chief Complaint  Patient presents with   Follow-up    Pt states she has had sinus problems, cough with clear mucus, and has also had some chest pain/tightness. Pt has been seen by Irby Mannan for asthma exacerbations a couple different times. Pt denies any fever.   Severe asthma on Dupixent .  Also with chronic sinusitis  [name] Nahlia Joines 89 y.o. -returns for follow-up of her issues.  I last saw her in October 2019.  After that earlier this month she came to see nurse practitioner 2 times for acute on chronic sinus issues.  She tells me that she is tolerating her Dupixent  injections just fine.  She feels it is helping her.  However, she is bothered by sinus issues this constantly sinus drainage particularly flared up in January 2020.  She recollects seeing Dr. Tellis Feathers in ENT.  At the time we recommended amoxicillin  but she was allergic  to it and no antibiotic therefore was prescribed according to history.  Since then she has seen an allergist in her local area and is been cleared to get amoxicillin .  She is okay seeing her ENT but she wants to change to  her husband's ENT surgeon Dr. Dionne Frederick.  Early this month with a sinus flush she got antibiotic and prednisone  this helped her.  However she still has significant amount of residual symptoms.  Asthma control question is 2.8 but the nitric oxide  level is normal.  This was excessive symptom burden.  She is waking up several times at night.  When she wakes up she has moderate symptoms she is moderately limited in her activities.  Moderate amount of shortness of breath and wheezing a little of the time and using albuterol  for rescue at least 1 time daily.  She says is unimproved symptomatology because of the recent antibiotic and prednisone .  She feels she needs another course of antibiotic and prednisone  as well.  In addition with the cough she is complaining of musculoskeletal pain in the upper chest and the back.  She feels the lungs are hurting.  There is no radiation.  The course is improving.  Of note, her past medical history lists her as having rheumatoid arthritis but she denies that to be the case.  She says she has osteoarthritis.  She does not see a rheumatologist.  She has had no prior immune work-up.    OV 02/28/2019  Subjective:  Patient ID: [name], female , DOB: 1932-09-13 , age 31 y.o. , MRN: [id number] , ADDRESS: [address] [place] [address]   02/28/2019 -   Chief Complaint  Patient presents with   Follow-up    She reports her breathing has been at her baseline.Allergy  to eggs, no flu shot. Reports her dupixent  has been great.      [name] [name] 89 y.o. -  Follow-up chronic severe persistent asthma: She is now on Dupixent , Symbicort , Spiriva  and Singulair .  She says her asthma is well under control.  Albuterol  rescue use is rare.  No  nocturnal awakenings.  She has deferred flu shot because she states I cannot take it   Follow-up chronic sinusitis: She has seen ENT Dr. Tio and has been prescribed Nasonex .  This is now under control.  New issue of ANA positivity 1: 1280 -in January 2020 because of chronic sinusitis I have ordered autoimmune and vasculitis profile.  Her ANCA profile is negative.  However her ANA is strongly positive.  She admits to having osteoarthritis and osteoporosis.  She denies she has rheumatoid arthritis although this is mentioned in her chart as past medical history.  She denies having seen a rheumatologist.  Review of the chart does not indicate she has had rheumatoid factor tested with an health system.    ROS - per [name]   OV 07/11/2019  Subjective:  Patient ID: [name], female , DOB: 11-03-1932 , age 58 y.o. , MRN: [id number] , ADDRESS: [address] [place] [address]   07/11/2019 -   Chief Complaint  Patient presents with   Follow-up    Pt states she has been doing good since last visit. States she does have an occ cough.     [name] [name] 89 y.o. -  Follow-up chronic severe asthma: Last seen in October 2020.  Since then she continues with her durvalumab on a scheduled basis.  She visits our office for that.  However she has become less compliant with the Symbicort .  She for the month of February 2021 she is only taken it 2 times.  She also takes Spiriva  only rarely.  She takes these 2 on a as needed basis.  She is compliant with her Singulair   on a daily basis.  Albuterol  is also as needed.  Today because of the weather changes rainy and warm she feels a remote tightness in her chest.  We do not have exam nitric oxide  test.  Asthma control question is 0.8 and is at the upper limit of good control.  She feels the Dupixent  has worked tremendously well for her  In terms of her chronic sinusitis: She follows with ENT and takes Nasonex    In terms of her strong positivity for  ANA: Last visit check extended autoimmune profile.  SSA was positive.  She then had a televisit in December 2020 with Dr. Baker Bon.  I reviewed that note.  She has a face-to-face visit coming up in March 2021.  She does admit to dryness of the eyes.  She might have Sjogren's and I informed this to her.   In terms of vaccine counseling.  She has had allergy  to flu shot.  She has other allergies documented below.  She does not want to do the COVID-19 vaccine.  She is social distancing.  She does outdoor church.  She wants to know when life will return to normal.  We discussed summer is a possibility.   OV 01/10/2020   Subjective:  Patient ID: [name], female , DOB: 1932-10-07, age 73 y.o. years. , MRN: [id number],  ADDRESS: [address] [place] [address] PCP  Aldo Hun, MD Providers : Treatment Team:  Attending Provider: Maire Scot, MD   Chief Complaint  Patient presents with   Follow-up    Severe Asthma       [name] 89 y.o. -returns for follow-up of asthma.  Since her last visit she still remains Covid unvaccinated.  She is scared of the Covid vaccine because of egg allergy .  Because of egg allergy  she normally does not do flu shot.  She has done other vaccines.  Asthma continues to be stable.  She is taking Dupixent  at full dose every 2 weeks in our office.  She does not want to do this at home.  She does not want to reduce the dose.  She also takes Symbicort  at 160/4.52 puff 2 times daily.  She is willing to reduce the dose for this ACT control score is 19 showing good control.  She recently relocated her sister who is 27 years old and has been doing chores.  She is trying to enjoy her [place] .  Of note even though ACT score is 19 and suggest poor control she tells me she barely ever wakes up in the middle of the night from asthma.  She is not using her albuterol  for rescue.  She feels asthma is well  controlled.    OV 08/08/2020  Subjective:  Patient ID: [name], female , DOB: 1932-09-21 , age 8 y.o. , MRN: [id number] , ADDRESS: [address] [place] [address] PCP Aldo Hun, MD Patient Care Team: Aldo Hun, MD as PCP - General (Internal Medicine) Nahser, Lela Purple, MD as PCP - Cardiology (Cardiology) Nahser, Lela Purple, MD as Consulting Physician (Cardiology)  This Provider for this visit: Treatment Team:  Attending Provider: Maire Scot, MD    08/08/2020 -   Chief Complaint  Patient presents with   Follow-up    Pt states she has been doing good since last visit. States she did have a flare up with allergies 2 weeks ago and states that she is a little SOB today.  [name] [name] 89 y.o. - presents for asthma follow-up.  She tells me that 2 weeks ago because of fall in the spring she started having sinus complaints and since then she has had more chest tightness.  Last few days even cough.  Last visit she decreased her Symbicort  at 50% of dosage.  She feels she needs to go up on it.  She is compliant with Singulair  and Dupixent .  She wants to do Dupixent  at home.  Today she is feels that she needs help with the asthma because it is an exacerbation.  There is no fever no nocturnal awakening or chest pain orthopnea proximal nocturnal dyspnea.   OV April 2022  08/17/2020 Follow up : Asthma  Patient presents for a 1 week follow-up.  Patient was seen last week for a follow-up for asthma.  She was having more asthma symptoms with cough and wheezing.  She was given a prednisone  taper.  Which she says she finished yesterday.  She is feeling much better.  Symptoms are returning back to her baseline.  She has minimum cough and wheezing.  She denies any fever or discolored mucus.  She remains on maintenance therapy with Symbicort  twice daily.  She says she had only been taking 1 puff twice daily and now over the last week is increased back up to 2 puffs twice  daily.  She is on Singulair  daily.  And is on Dupixent .  She says since starting Dupixent  she has felt so much better with her asthma.  And has had less exacerbations.  Patient is starting home injections soon.  And does endorse that she has a up-to-date EpiPen  at home.  OV 12/26/2020  Subjective:  Patient ID: [name], female , DOB: 28-Aug-1932 , age 53 y.o. , MRN: [id number] , ADDRESS: [address] [place] [address] PCP Aldo Hun, MD Patient Care Team: Aldo Hun, MD as PCP - General (Internal Medicine) Nahser, Lela Purple, MD as PCP - Cardiology (Cardiology) Nahser, Lela Purple, MD as Consulting Physician (Cardiology)  This Provider for this visit: Treatment Team:  Attending Provider: Maire Scot, MD    12/26/2020 -   Chief Complaint  Patient presents with   Follow-up    Pt states she has a cough she can not get rid of also SOB. Pt states symbicort  was not working before running out of medication she not sure if it was because it was outdated.      [name] [name] 89 y.o. -follow-up asthma with sinus drainage.  She continues on Symbicort , Dupixent  and Singulair .  She feels her asthma is at baseline even though ACT score shows significant symptoms.  She feels the symptoms are because of postnasal drainage for which she has seen ENT.  She is on nasal steroid, Tessalon  cough spells, Chlor-Trimeton , Delsym  and Mucinex .  She feels this is resistant to treatment.  This is a chronic problem.  She has seen ENT for this.  She is resigned to this being the baseline.  She says considering everything she is doing well.  She did have specific question about getting EVUSHELD.  She says in December 2020 when she took the [employer].  After that she got blood clot in her left lower extremity.  Review of the medication shows she is on Xarelto .  After that she has not taken mRNA vaccines.  She is nervous about it and she says she is allergic to those.  I do not know  what type of allergy .  She says primary care physician referred her for EVUSHE:D but she says she was declined.  She is asking if it is a good idea.  I did indicate to her that given the fast of time and given the fact that she is reluctant to take the other vaccine and she has had issues with the [employer] vaccine but is probably a good idea to get monoclonal antibody prophylaxis particularly given the recent variatnts.  She is asking me to make a referral.        OV 07/09/2021  Subjective:  Patient ID: [name], female , DOB: Jun 06, 1932 , age 66 y.o. , MRN: [id number] , ADDRESS: [address] [place] [address] PCP Aldo Hun, MD Patient Care Team: Aldo Hun, MD as PCP - General (Internal Medicine) Nahser, Lela Purple, MD as PCP - Cardiology (Cardiology) Nahser, Lela Purple, MD as Consulting Physician (Cardiology)  This Provider for this visit: Treatment Team:  Attending Provider: Maire Scot, MD    07/09/2021 -   Chief Complaint  Patient presents with   Follow-up     [name] 89 y.o. -asthma follow-up.  Last seen in the summer 2022.  She was on Dupixent  and Symbicort  and Singulair .  She is also on sinus treatment with different nasal sprays.  After that in November 2022 she fell and fractured her right hip and status post surgery.  After that she had hydrocodone  related gastritis for which she saw GI.  Then she had COVID early December 2022 for which she was not hospitalized.  All this is resulted in significant weight loss and physical deconditioning.  She is living at home with her husband who is quite active.  She is slowly gaining her strength back.  Through all this since the hospitalization in November 2022 she showed discharge summary where the hospitalist stopped her Symbicort  and Dupixent .  She is unclear why.  I read the discharge summary and asthma is not mentioned.  We do not know why this medicine was stopped.  However for the last 1  week she feels because of the COVID recently and also the pollen she is having slightly more respiratory symptoms.  So 1 week ago she restarted her Symbicort  and she is beginning to feel better.  Her albuterol  rescue use is only 1 time per week.  She is not having any nocturnal symptoms.  We discussed about starting Dupixent  right now.  She has a sample with her that will last her till July 2023.  I asked her about the option of not starting Dupixent  but just waiting and watching with just Symbicort  and Singulair  but seeing her rather than shorter interval for follow-up.  She is open to this idea.  We will do some evaluation at that time to see how she is objectively with her asthma.  In the past nitric oxide  exhalation test was high    10/16/2021 Follow up : Asthma  Patient returns for a 1 week follow-up.  Patient's been having difficulty with slow to resolve asthmatic bronchitic exacerbation.  Patient has underlying severe persistent asthma with allergic phenotype.  Previously had been on Symbicort  and Dupixent .  Dupixent  was stopped in the fall 2022.  She has had multiple flares over the last 6 weeks.  Received steroids on several occasions due to increased cough wheezing and shortness of breath.  She underwent pulmonary function testing that showed normal lung function with no airflow obstruction or restriction.  Lab work showed eosinophils slightly elevated with  an absolute count at 300.  Exhaled nitric oxide  testing was elevated last visit at 74 ppb.  Chest x-ray in April 2023 showed no acute process. Patient has recently been restarted back on Dupixent  at lower dose, has not started yet due to insurance. She has previous doses of Dupixent  at home but 300mg  doses.   She remains on Symbicort  twice daily and Singulair  and allegra  daily. Feno today is improving at 59  ppb.   Since last office she is feeling better after finishing another steroid pack . Decreased cough and wheezing .     OV  01/23/2022  Subjective:  Patient ID: [name], female , DOB: Feb 12, 1933 , age 6 y.o. , MRN: [id number] , ADDRESS: [address] [place] [address] PCP Aldo Hun, MD Patient Care Team: Aldo Hun, MD as PCP - General (Internal Medicine) Nahser, Lela Purple, MD as PCP - Cardiology (Cardiology) Nahser, Lela Purple, MD as Consulting Physician (Cardiology)  This Provider for this visit: Treatment Team:  Attending Provider: Maire Scot, MD    01/23/2022 -   Chief Complaint  Patient presents with   Follow-up    Pt states she has gotten better since last visit and denies any current complaints.   Asthma follow-up  [name] [name] 89 y.o. -returns for follow-up.  I stopped the Dupixent  but then over the summer she started having a lot of respiratory exacerbations.  Then Frances Ingles restarted her Dupixent .  She says after that she is significantly better.  She is only doing low-dose Dupixent .  Her ACT score is significantly improved.  She is not doing her Symbicort  as yet.  She wants to start her Symbicort  with a fall allergy  season coming up.  She is continuing her other medications.  She is grateful to Dupixent .  She tells me that in December 2022 she had COVID from her husband and since then she has had fatigue but Tammy did notice an interim chest x-ray to be clear.  She cannot do the flu shot because of egg allergy .  By extension.  She does not want to do the RSV vaccine.  She did do 1 COVID [employer] vaccine and that gave her blood clot and so she does not want to do any of the COVID vaccines either.  No other issues.     OV 04/24/2022  Subjective:  Patient ID: [name], female , DOB: 1932-06-28 , age 17 y.o. , MRN: [id number] , ADDRESS: [address] [place] [address] PCP Aldo Hun, MD Patient Care Team: Aldo Hun, MD as PCP - General (Internal Medicine) Nahser, Lela Purple, MD as PCP - Cardiology (Cardiology) Nahser, Lela Purple,  MD as Consulting Physician (Cardiology)  This Provider for this visit: Treatment Team:  Attending Provider: Maire Scot, MD    04/24/2022 -   Chief Complaint  Patient presents with   Follow-up    Pt states she has been doing okay since last visit and denies any complaints.     [name] [name] 89 y.o. -returns for asthma follow-up.  She is now on low-dose Dupixent .  She is also on Symbicort .  She is also on Singulair .  She is also on allergy  medicines.  She is doing well.  Asthma is well-controlled.  Just occasional cough.  Most recent chest x-ray April 2023 was normal.  ACT control score is 22.  She has a allergy  and therefore will not do the vaccines.  She is getting ready for Christmas.  She plans to  cook for her 38 year old brother and a 43 year old?  Sister and a disabled cousin.  She lives with her husband.  No children.     OV 12/16/2022  Subjective:  Patient ID: [name], female , DOB: 03/10/1933 , age 52 y.o. , MRN: [id number] , ADDRESS: [address] [place] [address] PCP Aldo Hun, MD Patient Care Team: Aldo Hun, MD as PCP - General (Internal Medicine) Nahser, Lela Purple, MD as PCP - Cardiology (Cardiology) Nahser, Lela Purple, MD as Consulting Physician (Cardiology)  This Provider for this visit: Treatment Team:  Attending Provider: Maire Scot, MD    12/16/2022 -   Chief Complaint  Patient presents with   Follow-up    6 months f/up, sob today, cough at times.     [name] [name] 89 y.o. -returns for routine follow-up.  She feels asthma is under control.  She is on Dupixent  low-dose.  She also is on Singulair  but she says she is only taking it as needed when she feels short of breath.  I did indicate to her that she can just come off it.  It does not release mechanism for dyspnea.  She is on Symbicort .  She does not take respiratory vaccines.  Her main complaint today is that she has got fatigue since her COVID in December  2022.  She got mild chronic kidney disease and she did have mild anemia in the past.  Her primary care physician is not on epic but she did say that she has had her blood work checked and this all stable.  Chest x-ray was last year with primary care physician and clear she is not interested in another chest x-ray.  We talked about referral to pulmonary rehabilitation -initially she was somewhat reluctant but later she did indicate that she will benefit from exercise and was open to hearing from them.  She definitely feels the fatigue is after her COVID and she feels she will benefit from exercise.  She also has baseline shortness of breath.  This is relieved by rest and brought on by exertion.      OV 10/27/2023  Subjective:  Patient ID: [name], female , DOB: [birth year] , age 89 y.o. , MRN: [id number] , AD[address]ESS: [address] [place] [address] PCP Aldo Hun, MD Patient Care Team: Aldo Hun, MD as PCP - General (Internal Medicine) Nahser, Lela Purple, MD as PCP - Cardiology (Cardiology) Nahser, Lela Purple, MD as Consulting Physician (Cardiology)  This Provider for this visit: Treatment Team:  Attending Provider: Maire Scot, MD    10/27/2023 -   Chief Complaint  Patient presents with   Follow-up    Doing well and has not had to use rescue inhaler recently.      [name] [name] 89 y.o. -returns for follow-up for her asthma.  She is controlled on monotherapy with low-dose Dupixent .  She stopped taking Symbicort  and Singulair  because she did not find benefit from this.  She has complete faith in Dupixent  which she states is a miracle drug.  Since her last visit no emergency room visits no hospitalizations no surgeries no urgent care visits.  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  Only issue is that today she has had increasing postnasal drip.  She plans to increase her Flonase  from a  as needed basis to a regular basis.  She does not feel the need for steroids systemic.  Asthma Control Panel  10/11/15 - IgE - 425 , rest of blood allergy  panel negative. Blood eos 300cells (al;so 300 on 12/19/16). CT Sinus feb 2018 - chronic pan sinusitis     07/02/2016 06/15/2017  08/13/2017  12/01/2017  06/08/2018  01/10/2020  12/26/2020  07/09/2021  June 2023 01/23/2022 Back on duppixen 04/24/2022  10/27/2023 Monotherapy low doe dupoxent  Current Med Regimen  Spriva, symbicort ,  Singulair , xolair , nexium  same Spiriva , Symbicort , Singulair , Nexium  and dupulimab Spiriva , Symbicort , Singulair , Nexium  and dupulimab         Act - ACT - a [employer] test - 4 week. Total score. Max is 25, Lower score is worse.  <19 = poor control      19 16 15  8-> 17 22 22 23   ACQ 5 point- 1 week. wtd avg score. <1.0 is good control 0.75-1.25 is grey zone. >1.25 poor control. Delta 0.5 is clinically meaningful   2.4 0.6 2.9         FeNO ppB  126 ppb 104 ppb 43 19         FeV1  1.777/90%, ratio 74,              Planned intervention  for visit   HRCT, talk to eye doc and if ok start dupixent , augmenting, prednsione,              PFT     Latest Ref Rng & Units 09/06/2021    8:31 AM 07/02/2016   10:21 AM 01/16/2016    9:29 AM 10/31/2015    8:31 AM 08/03/2015    9:43 AM  PFT Results  FVC-Pre L 2.37  2.39  2.26  1.88  2.29   FVC-Predicted Pre % 97  90  84  70  85   FVC-Post L 2.36  2.51  2.57  2.05  2.53   FVC-Predicted Post % 96  95  96  76  94   Pre FEV1/FVC % % 74  74  68  67  71   Post FEV1/FCV % % 76  77  76  70  72   FEV1-Pre L 1.74  1.77  1.54  1.26  1.63   FEV1-Predicted Pre % 96  90  78  63  81   FEV1-Post L 1.80  1.93  1.95  1.44  1.82   DLCO uncorrected ml/min/mmHg 14.55     17.59   DLCO UNC% % 76     69   DLCO corrected ml/min/mmHg 14.55     18.36   DLCO COR %Predicted % 76     72   DLVA Predicted % 88     97   TLC L 4.95     5.97   TLC % Predicted % 95     115   RV % Predicted % 105     143         LAB RESULTS last 96 hours No results found.       has a past medical history of Anemia, Angio-edema, Asthma, severe persistent, Cancer (HCC), Chronic kidney disease, stage II (mild), DDD (degenerative disc disease), cervical, Diverticulitis, Diverticulosis (yrs ago), Edema, lower extremity, GERD (gastroesophageal reflux disease), Headache, Heart murmur, History of breast cancer (1990 left mastectomy also), History of DVT of lower extremity (5 yrs ago), History of shingles, Hyperlipidemia, Internal hemorrhoid, Leg ulcer, left (HCC), Osteoporosis, unspecified, Perennial allergic rhinitis, Peripheral vascular disease (HCC), Pneumonia, Restless legs syndrome (RLS), Rheumatoid arthritis (HCC), Thyroid  nodule, Unspecified essential  hypertension, and Urticaria.   reports that she has never smoked. She has never used smokeless tobacco.  Past Surgical History:  Procedure Laterality Date   CARDIAC CATHETERIZATION  08/ 01/ 2008   dr Floria Hurst   normal -- EF of 65%   CARDIOVASCULAR STRESS TEST  05-22-2011   dr Floria Hurst   normal lexiscan  perfusion study/  no ischemia/  lvsf  86%   CATARACT EXTRACTION W/ INTRAOCULAR LENS  IMPLANT, BILATERAL     CHOLECYSTECTOMY  1993   laparoscopic   CONVERSION TO TOTAL HIP Left 12/03/2017   Procedure: CONVERSION TO LEFT TOTAL HIP;  Surgeon: Claiborne Crew, MD;  Location: [facility];  Service: Orthopedics;  Laterality: Left;  90 mins   HIP ARTHROPLASTY Left 12/22/2016   Procedure: HEMIARTHROPLASTY, LEFT;  Surgeon: Claiborne Crew, MD;  Location: [facility];  Service: Orthopedics;  Laterality: Left;   INCISION AND DRAINAGE OF WOUND Left 10/24/2013   Procedure: IRRIGATION AND DEBRIDEMENT OF LEFT LEG WITH PLACEMENT OF ACELL AND WOUND VAC;  Surgeon: Marilou Showman, DO;  Location: [facility];  Service: Plastics;  Laterality: Left;   KNEE ARTHROPLASTY     LUMBAR DISC SURGERY  1981  &  1983   MASTECTOMY Bilateral rigth 1989///   left  1990   breast cancer 1989//    fibrocytic disease 1990   ORIF HUMERUS FRACTURE Right 09/21/2018   Procedure: OPEN REDUCTION INTERNAL FIXATION (ORIF) PROXIMAL HUMERUS FRACTURE;  Surgeon: Janeth Medicus, MD;  Location: [facility];  Service: Orthopedics;  Laterality: Right;  90 mins   TOTAL ABDOMINAL HYSTERECTOMY W/ BILATERAL SALPINGOOPHORECTOMY  1989   done at same time as right mastectomy   TOTAL HIP ARTHROPLASTY Right 04/02/2021   Procedure: TOTAL HIP ARTHROPLASTY ANTERIOR APPROACH;  Surgeon: Claiborne Crew, MD;  Location: [facility];  Service: Orthopedics;  Laterality: Right;   TOTAL KNEE ARTHROPLASTY Right 10/05/2012   Procedure: RIGHT TOTAL KNEE ARTHROPLASTY;  Surgeon: Bevin Bucks, MD;  Location: [facility];  Service: Orthopedics;  Laterality: Right;    Allergies  Allergen Reactions   Codeine Nausea And Vomiting   Hydrocodone  Nausea And Vomiting and Other (See Comments)   Ace Inhibitors     Other reaction(s): cough   Amlodipine     Other reaction(s): too much edemastopped it around 4/17   Egg Phospholipids    Egg-Derived Products     UNSPECIFIED REACTION  RAW EGGS.. Can eat cooked eggs   Ferrous Sulfate      Other reaction(s): hurt stomach.   Influenza Vaccines     UNSPECIFIED REACTION  Egg allergy     Metoprolol      Other reaction(s): cannot sleep on it and i worry about beta blocker with her asthma   Nucala  [Mepolizumab ]     UNSPECIFIED REACTION    Telithromycin     Other reaction(s): N/V possibly   Cephalosporins Rash   Gabapentin  Nausea Only   Levofloxacin Rash   Pneumococcal Vaccines Rash    Immunization History  Administered Date(s) Administered   Influenza Split 01/24/2009, 06/03/2011, 08/12/2012, 09/05/2014   Janssen (J&J) SARS-COV-2 Vaccination 04/19/2020   Pneumococcal Conjugate-13 09/14/2012   Pneumococcal Polysaccharide-23 01/31/2009, 06/03/2011   Td 06/03/2011   Tdap 09/07/2018   Zoster Recombinant(Shingrix) 04/18/2017, 09/16/2017   Zoster, Live 07/30/2007, 06/03/2011    Family History   Problem Relation Age of Onset   Heart attack Mother    Asthma Mother    Heart disease Mother    Hyperlipidemia Mother    Allergic rhinitis Mother  Heart attack Father    Heart failure Father    Deep vein thrombosis Father    Heart disease Father    Peripheral vascular disease Father        amputation   Asthma Brother    Allergic rhinitis Brother    Asthma Sister    Eczema Sister    Allergic rhinitis Sister    Cancer Brother    Asthma Brother    Coronary artery disease Sister    Heart disease Sister    Asthma Maternal Grandmother    Urticaria Neg Hx      Current Outpatient Medications:    albuterol  (PROVENTIL ) (2.5 MG/3ML) 0.083% nebulizer solution, Take 3 mLs (2.5 mg total) by nebulization every 6 (six) hours as needed for wheezing or shortness of breath., Disp: 75 mL, Rfl: 12   albuterol  (VENTOLIN  HFA) 108 (90 Base) MCG/ACT inhaler, TAKE 2 PUFFS BY MOUTH EVERY 6 HOURS AS NEEDED FOR WHEEZE OR SHORTNESS OF BREATH, Disp: 8.5 each, Rfl: 5   Calcium  Carbonate-Vitamin D  600-400 MG-UNIT tablet, Take 1 tablet by mouth daily., Disp: , Rfl:    Cholecalciferol  (VITAMIN D3) 1000 UNITS tablet, Take 1,000 Units by mouth every evening. , Disp: , Rfl:    dextromethorphan  (DELSYM ) 30 MG/5ML liquid, Take 60 mg by mouth 2 (two) times daily as needed for cough., Disp: , Rfl:    docusate sodium  (COLACE) 100 MG capsule, Take 1 capsule (100 mg total) by mouth 2 (two) times daily., Disp: 10 capsule, Rfl: 0   dupilumab  (DUPIXENT ) 200 MG/1. prefilled syringe, Inject 200 mg into the skin every 14 (fourteen) days., Disp: 6.84 mL, Rfl: 0   fexofenadine  (ALLEGRA ) 180 MG tablet, Take 180 mg by mouth daily.  , Disp: , Rfl:    furosemide  (LASIX ) 20 MG tablet, Take 1 tablet (20 mg total) by mouth every Monday, Wednesday, and Friday., Disp: 120 tablet, Rfl: 3   mirtazapine (REMERON) 15 MG tablet, Take half tablet (7.5 total) by mouth at bedtime, Disp: , Rfl:    mometasone  (NASONEX ) 50 MCG/ACT nasal spray,  USE 2 SPRAYS NASALLY 2 TIMES DAILY, Disp: 17 g, Rfl: 11   Multiple Vitamin (MULTIVITAMIN WITH MINERALS) TABS tablet, Take 1 tablet by mouth daily. Centrum Silver Multivitamin, Disp: , Rfl:    multivitamin-lutein (OCUVITE-LUTEIN) CAPS capsule, Take 1 capsule by mouth daily., Disp: , Rfl:    mupirocin ointment (BACTROBAN) 2 %, as needed. , Disp: , Rfl:    nitroGLYCERIN  (NITROSTAT ) 0.4 MG SL tablet, Place 1 tablet (0.4 mg total) under the tongue every 5 (five) minutes as needed for chest pain., Disp: 25 tablet, Rfl: 3   pantoprazole  (PROTONIX ) 40 MG tablet, Take 40 mg by mouth daily., Disp: , Rfl:    pramipexole  (MIRAPEX ) 0.125 MG tablet, Take 0.375 mg by mouth at bedtime. (Patient taking differently: 1/2 tablet at bedtime), Disp: , Rfl:    rivaroxaban  (XARELTO ) 10 MG TABS tablet, Take 10 mg by mouth daily., Disp: , Rfl:    rosuvastatin  (CRESTOR ) 5 MG tablet, TAKE 1 TABLET BY MOUTH EVERYDAY AT BEDTIME, Disp: 90 tablet, Rfl: 3   Spacer/Aero-Holding Chambers (AEROCHAMBER MV) inhaler, by Other route. Use as instructed , Disp: , Rfl:    telmisartan  (MICARDIS ) 20 MG tablet, Take 20 mg by mouth daily. , Disp: , Rfl:    vitamin B-12 (CYANOCOBALAMIN ) 500 MCG tablet, Take 500 mcg by mouth daily.  , Disp: , Rfl:    ipratropium (ATROVENT ) 0.06 % nasal spray, Place 2 sprays into both nostrils  2 (two) times daily. (Patient not taking: Reported on 10/27/2023), Disp: 15 mL, Rfl: 6  Current Facility-Administered Medications:    albuterol  (PROVENTIL ) (2.5 MG/3ML) 0.083% nebulizer solution 2.5 mg, 2.5 mg, Nebulization, Q6H PRN, Groce, Sarah F, NP, 2.5 mg at 10/08/21 8295   albuterol  (VENTOLIN  HFA) 108 (90 Base) MCG/ACT inhaler 2 puff, 2 puff, Inhalation, Once PRN, Causey, Lindsey Cornetto, NP   diphenhydrAMINE  (BENADRYL ) injection 50 mg, 50 mg, Intramuscular, Once PRN, Causey, Laura Polio, NP   EPINEPHrine  (EPI-PEN) injection 0.3 mg, 0.3 mg, Intramuscular, Once PRN, Percival Brace, NP      Objective:    Vitals:   10/27/23 1253  BP: (!) 158/64  Pulse: 80  SpO2: 98%  Weight: 133 lb 12.8 oz (60.7 kg)  Height: 5' 3 (1.6 m)    Estimated body mass index is 23.7 kg/m as calculated from the following:   Height as of this encounter: 5' 3 (1.6 m).   Weight as of this encounter: 133 lb 12.8 oz (60.7 kg).  @WEIGHTCHANGE @  [employer]   10/27/23 1253  Weight: 133 lb 12.8 oz (60.7 kg)     Physical Exam   General: No distress. Looks well O2 at rest: no Cane present: no Sitting in wheel chair: no Frail: no Obese: no Neuro: Alert and Oriented x 3. GCS 15. Speech normal Psych: Pleasant Resp:  Barrel Chest - no.  Wheeze - o, Crackles - no, No overt respiratory distress CVS: Normal heart sounds. Murmurs - no Ext: Stigmata of Connective Tissue Disease - no HEENT: Normal upper airway. PEERL +. No post nasal drip        Assessment:       ICD-10-CM   1. Severe persistent asthma, unspecified whether complicated  J45.50     2. Post-nasal drip  R09.82          Plan:     Patient Instructions     ICD-10-CM   1. Severe persistent asthma, unspecified whether complicated  J45.50     2. Post-nasal drip  R09.82          Well controlled after restart dupixent  summer 2023 - on low dose protocol Off symbicort  and singulair  Some increaes in post nasal drip x 1 days   plan - continue low dose  Dupixent    = restart flonase  for atleast 2 weeks - use albuterol  as needed.  -monitor off symbicort  and singulair  - Respect no respiratory vaccines Plan Follow up with APP in 9 months or sooner Please contact office for sooner follow up if symptoms do not improve or worsen or seek emergency care    FOLLOWUP Return in about 9 months (around 07/26/2024) for with any of the APPS.    SIGNATURE    Dr. Maire Scot, M.D., F.C.C.P,  Pulmonary and Critical Care Medicine Staff Physician, [facility] Director - Interstitial Lung Disease  Program  Pulmonary  Fibrosis [facility] at [facility] [place], [place], [address]  Pager: [phone], If no answer or between  15:00h - 7:00h: call [phone] Telephone: [phone]  1:17 PM 10/27/2023

## 2023-10-27 NOTE — Patient Instructions (Addendum)
 ICD-10-CM   1. Severe persistent asthma, unspecified whether complicated  J45.50     2. Post-nasal drip  R09.82          Well controlled after restart dupixent  summer 2023 - on low dose protocol Off symbicort  and singulair  Some increaes in post nasal drip x 1 days   plan - continue low dose  Dupixent    = restart flonase  for atleast 2 weeks - use albuterol  as needed.  -monitor off symbicort  and singulair  - Respect no respiratory vaccines Plan Follow up with APP in 9 months or sooner Please contact office for sooner follow up if symptoms do not improve or worsen or seek emergency care

## 2023-11-04 ENCOUNTER — Telehealth: Payer: Self-pay | Admitting: Pharmacy Technician

## 2023-11-04 NOTE — Telephone Encounter (Signed)
 Auth Submission: NO AUTH NEEDED Site of care: Site of care: [facility] Payer: [employer] MEDICARE Medication & CPT/J Code(s) submitted: BONIVA J1740 Diagnosis Code:  Route of submission (phone, fax, portal): PORTAL Phone # Fax # Auth type: Buy/Bill HB Units/visits requested: Q28DAYS Reference number: [id number] Approval from: 11/04/23 to 05/11/24

## 2023-11-11 ENCOUNTER — Encounter (HOSPITAL_COMMUNITY)
Admission: RE | Admit: 2023-11-11 | Discharge: 2023-11-11 | Disposition: A | Discharge status: Home / Self Care | Source: Ambulatory Visit | Attending: Internal Medicine | Admitting: Internal Medicine

## 2023-11-11 DIAGNOSIS — M810 Age-related osteoporosis without current pathological fracture: Secondary | ICD-10-CM | POA: Diagnosis present

## 2023-11-11 MED ORDER — IBANDRONATE SODIUM 3 MG/3ML IV SOLN
3.0000 mg | Freq: Once | INTRAVENOUS | Status: AC
  Administered 2023-11-11: 3 mg via INTRAVENOUS
  Filled 2023-11-11: qty 3

## 2023-11-17 ENCOUNTER — Encounter (HOSPITAL_COMMUNITY)

## 2023-12-09 ENCOUNTER — Other Ambulatory Visit: Payer: Self-pay | Admitting: Internal Medicine

## 2023-12-09 DIAGNOSIS — J4550 Severe persistent asthma, uncomplicated: Secondary | ICD-10-CM

## 2023-12-09 MED ORDER — DUPIXENT 200 MG/1.14ML ~~LOC~~ SOSY: 200.0000 mg | PREFILLED_SYRINGE | SUBCUTANEOUS | 1 refills | Status: AC

## 2023-12-09 NOTE — Telephone Encounter (Signed)
 Refill sent for DUPIXENT  to [facility]: [phone]  Dose: 200mg  subcut every 14 days  Last OV: 10/27/2023 Provider: Dr. Geronimo  Next OV: due in March 2026  Sherry Pennant, PharmD, MPH, BCPS Clinical Pharmacist (Rheumatology and Pulmonology)

## 2023-12-09 NOTE — Telephone Encounter (Signed)
 Copied from CRM [id number]. Topic: Clinical - Medication Refill >> Dec 09, 2023 11:57 AM Nathanel DEL wrote: Medication:  dupilumab  (DUPIXENT ) 200 MG/1. prefilled syringe  Has the patient contacted their pharmacy? Yes Maurilio is calling from [facility] to ask for this refill  This is the patient's preferred pharmacy:  [facility] - [place], [place] - [address] [place] [address] Phone: [phone] Fax: [phone]  Is this the correct pharmacy for this prescription? Yes If no, delete pharmacy and type the correct one.   Has the prescription been filled recently? Yes  Is the patient out of the medication? Yes  Has the patient been seen for an appointment in the last year OR does the patient have an upcoming appointment? Yes  Can we respond through MyChart? No  Agent: Please be advised that Rx refills may take up to 3 business days. We ask that you follow-up with your pharmacy.

## 2023-12-19 ENCOUNTER — Other Ambulatory Visit: Payer: Self-pay

## 2023-12-19 ENCOUNTER — Encounter (HOSPITAL_BASED_OUTPATIENT_CLINIC_OR_DEPARTMENT_OTHER): Payer: Self-pay

## 2023-12-19 ENCOUNTER — Emergency Department (HOSPITAL_BASED_OUTPATIENT_CLINIC_OR_DEPARTMENT_OTHER): Admitting: Radiology

## 2023-12-19 ENCOUNTER — Emergency Department (HOSPITAL_BASED_OUTPATIENT_CLINIC_OR_DEPARTMENT_OTHER)
Admission: EM | Admit: 2023-12-19 | Discharge: 2023-12-20 | Disposition: A | Discharge status: Home / Self Care | Attending: Emergency Medicine | Admitting: Emergency Medicine

## 2023-12-19 DIAGNOSIS — I110 Hypertensive heart disease with heart failure: Secondary | ICD-10-CM | POA: Insufficient documentation

## 2023-12-19 DIAGNOSIS — Z7901 Long term (current) use of anticoagulants: Secondary | ICD-10-CM | POA: Diagnosis not present

## 2023-12-19 DIAGNOSIS — R1013 Epigastric pain: Secondary | ICD-10-CM | POA: Insufficient documentation

## 2023-12-19 DIAGNOSIS — I2510 Atherosclerotic heart disease of native coronary artery without angina pectoris: Secondary | ICD-10-CM | POA: Diagnosis not present

## 2023-12-19 DIAGNOSIS — J45909 Unspecified asthma, uncomplicated: Secondary | ICD-10-CM | POA: Insufficient documentation

## 2023-12-19 DIAGNOSIS — I5089 Other heart failure: Secondary | ICD-10-CM | POA: Diagnosis not present

## 2023-12-19 DIAGNOSIS — R0789 Other chest pain: Secondary | ICD-10-CM | POA: Insufficient documentation

## 2023-12-19 DIAGNOSIS — Z79899 Other long term (current) drug therapy: Secondary | ICD-10-CM | POA: Insufficient documentation

## 2023-12-19 DIAGNOSIS — R002 Palpitations: Secondary | ICD-10-CM | POA: Diagnosis present

## 2023-12-19 DIAGNOSIS — R079 Chest pain, unspecified: Secondary | ICD-10-CM

## 2023-12-19 LAB — CBC WITH DIFFERENTIAL/PLATELET
Abs Immature Granulocytes: 0.01 K/uL (ref 0.00–0.07)
Basophils Absolute: 0 K/uL (ref 0.0–0.1)
Basophils Relative: 0 %
Eosinophils Absolute: 0.2 K/uL (ref 0.0–0.5)
Eosinophils Relative: 4 %
HCT: 36.3 % (ref 36.0–46.0)
Hemoglobin: 12 g/dL (ref 12.0–15.0)
Immature Granulocytes: 0 %
Lymphocytes Relative: 30 %
Lymphs Abs: 1.6 K/uL (ref 0.7–4.0)
MCH: 30.9 pg (ref 26.0–34.0)
MCHC: 33.1 g/dL (ref 30.0–36.0)
MCV: 93.6 fL (ref 80.0–100.0)
Monocytes Absolute: 0.4 K/uL (ref 0.1–1.0)
Monocytes Relative: 7 %
Neutro Abs: 3.1 K/uL (ref 1.7–7.7)
Neutrophils Relative %: 59 %
Platelets: 192 K/uL (ref 150–400)
RBC: 3.88 MIL/uL (ref 3.87–5.11)
RDW: 12.4 % (ref 11.5–15.5)
WBC: 5.2 K/uL (ref 4.0–10.5)
nRBC: 0 % (ref 0.0–0.2)

## 2023-12-19 NOTE — ED Provider Notes (Signed)
 [facility] AT [name]  Provider Note  CSN: [id number] Arrival date & time: 12/19/23 2250  History Chief Complaint  Patient presents with   Chest Pain    [name] is a 89 y.o. female with history of HFpEF, asthma, CAD with negative stress in 2020, HTN, prior DVT on Xarelto  presents for evaluation of palpitations. She reports about an hour prior to arrival while at rest, she had onset of rapid HR. She denied having chest pain at that time but reports she had some chest discomfort earlier in the day. She had some epigastric discomfort during this episode. She has not had cough, congestion or fever beyond her usual asthma. She has never had MI or stents. She took a NTG and it seemed to help. She reports her BP was elevated earlier tonight as well. She is feeling better at the time of my evaluation.    Home Medications Prior to Admission medications   Medication Sig Start Date End Date Taking? Authorizing Provider  albuterol  (PROVENTIL ) (2.5 MG/3ML) 0.083% nebulizer solution Take 3 mLs (2.5 mg total) by nebulization every 6 (six) hours as needed for wheezing or shortness of breath. 08/19/21   Geronimo Amel, MD  albuterol  (VENTOLIN  HFA) 108 (90 Base) MCG/ACT inhaler TAKE 2 PUFFS BY MOUTH EVERY 6 HOURS AS NEEDED FOR WHEEZE OR SHORTNESS OF BREATH 12/30/22   Geronimo Amel, MD  Calcium  Carbonate-Vitamin D  600-400 MG-UNIT tablet Take 1 tablet by mouth daily.    [provider]  Cholecalciferol  (VITAMIN D3) 1000 UNITS tablet Take 1,000 Units by mouth every evening.     [provider]  dextromethorphan  (DELSYM ) 30 MG/5ML liquid Take 60 mg by mouth 2 (two) times daily as needed for cough.    [provider]  docusate sodium  (COLACE) 100 MG capsule Take 1 capsule (100 mg total) by mouth 2 (two) times daily. 04/03/21   Patti Rosina SAUNDERS, PA-C  dupilumab  (DUPIXENT ) 200 MG/1. prefilled syringe Inject 200 mg into the skin every 14  (fourteen) days. 12/09/23   Geronimo Amel, MD  fexofenadine  (ALLEGRA ) 180 MG tablet Take 180 mg by mouth daily.   12/02/10   Brien Belvie BRAVO, MD  furosemide  (LASIX ) 20 MG tablet Take 1 tablet (20 mg total) by mouth every Monday, Wednesday, and Friday. 08/19/23 11/17/23  Lelon Hamilton T, PA-C  ipratropium (ATROVENT ) 0.06 % nasal spray Place 2 sprays into both nostrils 2 (two) times daily. Patient not taking: Reported on 10/27/2023 08/26/21   Parrett, Madelin RAMAN, NP  mirtazapine (REMERON) 15 MG tablet Take half tablet (7.5 total) by mouth at bedtime 07/18/21   [provider]  mometasone  (NASONEX ) 50 MCG/ACT nasal spray USE 2 SPRAYS NASALLY 2 TIMES DAILY 08/26/21   Parrett, Madelin RAMAN, NP  Multiple Vitamin (MULTIVITAMIN WITH MINERALS) TABS tablet Take 1 tablet by mouth daily. Centrum Silver Multivitamin    [provider]  multivitamin-lutein (OCUVITE-LUTEIN) CAPS capsule Take 1 capsule by mouth daily.    [provider]  mupirocin ointment (BACTROBAN) 2 % as needed.     [provider]  nitroGLYCERIN  (NITROSTAT ) 0.4 MG SL tablet Place 1 tablet (0.4 mg total) under the tongue every 5 (five) minutes as needed for chest pain. 06/15/20   Nahser, Aleene PARAS, MD  pantoprazole  (PROTONIX ) 40 MG tablet Take 40 mg by mouth daily. 07/20/21   [provider]  pramipexole  (MIRAPEX ) 0.125 MG tablet Take 0.375 mg by mouth at bedtime. Patient taking differently: 1/2 tablet at bedtime  [provider]  rivaroxaban  (XARELTO ) 10 MG TABS tablet Take 10 mg by mouth daily.    [provider]  rosuvastatin  (CRESTOR ) 5 MG tablet TAKE 1 TABLET BY MOUTH EVERYDAY AT BEDTIME 09/29/22   Nahser, Aleene PARAS, MD  Spacer/Aero-Holding Chambers (AEROCHAMBER MV) inhaler by Other route. Use as instructed     [provider]  telmisartan  (MICARDIS ) 20 MG tablet Take 20 mg by mouth daily.     [provider]  vitamin B-12 (CYANOCOBALAMIN ) 500 MCG tablet Take 500 mcg by mouth  daily.      [provider]     Allergies    Codeine, Hydrocodone , Ace inhibitors, Amlodipine, Egg phospholipids, Egg-derived products, Ferrous sulfate , Influenza vaccines, Metoprolol , Nucala  [mepolizumab ], Telithromycin, Cephalosporins, Gabapentin , Levofloxacin, and Pneumococcal vaccines   Review of Systems   Review of Systems Please see HPI for pertinent positives and negatives  Physical Exam BP (!) 170/75 (BP Location: Left Arm)   Pulse 97   Resp 17   SpO2 97%   Physical Exam Vitals and nursing note reviewed.  Constitutional:      Appearance: Normal appearance.  HENT:     Head: Normocephalic and atraumatic.     Nose: Nose normal.     Mouth/Throat:     Mouth: Mucous membranes are moist.  Eyes:     Extraocular Movements: Extraocular movements intact.     Conjunctiva/sclera: Conjunctivae normal.  Cardiovascular:     Rate and Rhythm: Normal rate.  Pulmonary:     Effort: Pulmonary effort is normal.     Breath sounds: Normal breath sounds.  Abdominal:     General: Abdomen is flat.     Palpations: Abdomen is soft.     Tenderness: There is no abdominal tenderness.  Musculoskeletal:        General: No swelling. Normal range of motion.     Cervical back: Neck supple.     Right lower leg: No edema.     Left lower leg: No edema.  Skin:    General: Skin is warm and dry.  Neurological:     General: No focal deficit present.     Mental Status: She is alert.  Psychiatric:        Mood and Affect: Mood normal.     ED Results / Procedures / Treatments   EKG EKG Interpretation Date/Time:  Saturday December 19 2023 23:02:03 EDT Ventricular Rate:  91 PR Interval:  207 QRS Duration:  140 QT Interval:  385 QTC Calculation: 474 R Axis:   -30  Text Interpretation: Sinus rhythm Borderline prolonged PR interval Left bundle branch block No significant change since last tracing Confirmed by Roselyn Dunnings [phone]) on 12/19/2023 11:05:02  PM  Procedures Procedures  Medications Ordered in the ED Medications - No data to display  Initial Impression and Plan  Patient here with an atypical presentation of palpitations, chest discomfort and HTN. She is feeling better now. Will check labs and CXR. EKG is unchanged.   ED Course       MDM Rules/Calculators/A&P Medical Decision Making Amount and/or Complexity of Data Reviewed Labs: ordered. Radiology: ordered.     Final Clinical Impression(s) / ED Diagnoses Final diagnoses:  None    Rx / DC Orders ED Discharge Orders     None

## 2023-12-19 NOTE — ED Triage Notes (Signed)
 Pt c/o HTN all day, my heart's just been a-rockin. Advises pain is not bad, I just don't feel right. NTG for pain, it helped.  Shob, hx asthma

## 2023-12-20 LAB — BASIC METABOLIC PANEL WITH GFR
Anion gap: 14 (ref 5–15)
BUN: 26 mg/dL — ABNORMAL HIGH (ref 8–23)
CO2: 27 mmol/L (ref 22–32)
Calcium: 9.8 mg/dL (ref 8.9–10.3)
Chloride: 99 mmol/L (ref 98–111)
Creatinine, Ser: 1.61 mg/dL — ABNORMAL HIGH (ref 0.44–1.00)
GFR, Estimated: 30 mL/min — ABNORMAL LOW (ref >60)
Glucose, Bld: 127 mg/dL — ABNORMAL HIGH (ref 70–99)
Potassium: 3.5 mmol/L (ref 3.5–5.1)
Sodium: 139 mmol/L (ref 135–145)

## 2023-12-20 LAB — TROPONIN T, HIGH SENSITIVITY
Troponin T High Sensitivity: 19 ng/L — ABNORMAL HIGH (ref <19)
Troponin T High Sensitivity: 21 ng/L — ABNORMAL HIGH (ref <19)

## 2023-12-20 LAB — PRO BRAIN NATRIURETIC PEPTIDE: Pro Brain Natriuretic Peptide: 572 pg/mL — ABNORMAL HIGH (ref <300.0)

## 2023-12-22 ENCOUNTER — Telehealth: Payer: Self-pay | Admitting: Physician Assistant

## 2023-12-22 DIAGNOSIS — R002 Palpitations: Secondary | ICD-10-CM

## 2023-12-22 NOTE — Telephone Encounter (Signed)
 Let's get a 30 day event monitor to r/o AFib. Make sure she has follow up OV after the monitor - schedule 2 mos from now. Glendia Ferrier, PA-C    12/22/2023 1:19 PM

## 2023-12-22 NOTE — Telephone Encounter (Signed)
 Pt expresses takes Xarelto  10 mg daily without missing doses.

## 2023-12-22 NOTE — Telephone Encounter (Signed)
 Called pt tof/u need for heart monitor. Pt reports had an episode that felt like heart was quivering that wouldn't stop so went to [facility] ED.  Has felt really tired the past 2 days.  Was advised by PCP that needs a heart monitor.   [facility] lab work from 12/19/23  showed elevated BNP 572 and slightly elevated Troponin 19 and 21.  EKG showed Sinus rhythm Borderline prolonged PR interval Left bundle branch block No significant change since last tracing   Not currently having palpitations that led to ED visit.  But feels extremely tired.  Denies lightheadedness/ dizziness.  Reports a little SOB.  Reports BP was elevated at OV today and Telmisartan  20 mg was increased to twice daily.  Reports BP has been elevated at night. Pt takes furosemide  20 mg every day instead of M,W,F takes KCL MWF.    Advised will send concern to Provider for recommendations.

## 2023-12-22 NOTE — Telephone Encounter (Signed)
 Patient was seen in the ER and then in their office for a f/u. Thinks the patient needs an even monitor put on. Office will be sending of notes to our office. Please advise

## 2023-12-22 NOTE — Telephone Encounter (Signed)
 Called pt advised of providers recommendation: Let's get a 30 day event monitor to r/o AFib. Make sure she has follow up OV after the monitor - schedule 2 mos from now. Glendia Ferrier, PA-C    12/22/2023 1:19 PM    Pt expresses will need assistance putting monitor on.  Advised will send a message to our monitor team to schedule a visit to have monitor placed.   OV scheduled on 02/17/24 at 10:30 am with Ferrier, PA.

## 2023-12-23 ENCOUNTER — Ambulatory Visit: Attending: Physician Assistant

## 2023-12-23 DIAGNOSIS — R002 Palpitations: Secondary | ICD-10-CM

## 2023-12-23 NOTE — Progress Notes (Unsigned)
 [employer] event monitor serial # [id number] from office inventory applied to patient. DOD to read.

## 2024-01-06 ENCOUNTER — Other Ambulatory Visit: Payer: Self-pay | Admitting: Physician Assistant

## 2024-01-22 DIAGNOSIS — M810 Age-related osteoporosis without current pathological fracture: Secondary | ICD-10-CM | POA: Insufficient documentation

## 2024-01-28 DIAGNOSIS — R002 Palpitations: Secondary | ICD-10-CM | POA: Diagnosis not present

## 2024-01-31 ENCOUNTER — Encounter: Payer: Self-pay | Admitting: Physician Assistant

## 2024-01-31 ENCOUNTER — Ambulatory Visit: Payer: Self-pay | Admitting: Physician Assistant

## 2024-01-31 DIAGNOSIS — I493 Ventricular premature depolarization: Secondary | ICD-10-CM | POA: Insufficient documentation

## 2024-02-12 ENCOUNTER — Inpatient Hospital Stay (HOSPITAL_COMMUNITY): Admission: RE | Admit: 2024-02-12 | Source: Ambulatory Visit

## 2024-02-12 ENCOUNTER — Encounter (HOSPITAL_COMMUNITY)

## 2024-02-12 DIAGNOSIS — M810 Age-related osteoporosis without current pathological fracture: Secondary | ICD-10-CM

## 2024-02-16 NOTE — Assessment & Plan Note (Signed)
 Coronary artery calcification with a negative nuclear stress test in 2020 for ischemia or infarction. ***

## 2024-02-16 NOTE — Assessment & Plan Note (Signed)
 Patient was seen in the emergency room in August with palpitations.  Monitor demonstrated 3% PVCs and 3% PACs.  There was no atrial fibrillation.***

## 2024-02-16 NOTE — Progress Notes (Unsigned)
 OFFICE NOTE:    Date:  02/17/2024  ID:  [name], DOB 1933/03/13, MRN [id number] PCP: Shayne Anes, MD  [facility] Providers Cardiologist:  Anes Parchment, MD Cardiology APP:  Lelon Glendia DASEN, PA-C        Coronary artery Ca2+ Chest pain  Cath 12/22/2006: no CAD Myoview  06/21/2018: EF 67, no ischemia or infarction; low risk (HFpEF) heart failure with preserved ejection fraction  TTE 12/23/2018: EF 55-60, no RWMA, G1 DD, normal RVSF, mild LAE, mild to moderate MAC, mild calcification of the AV TTE 09/17/2022: EF 60-65, no RWMA, mild asymmetric LVH, normal RVSF, trivial MR Left Bundle Branch Block  Hypertension  Hyperlipidemia  Asthma Breast CA s/p R mastectomy 1989, L mastectomy 1990 Recurrent DVT, chronic anticoagulation  Long COVID (fatigue)       Discussed the use of AI scribe software for clinical note transcription with the patient, who gave verbal consent to proceed. History of Present Illness [name] is a 89 y.o. female who returns for follow-up on palpitations. She was last seen in April 2025.  She went to the emergency department at [facility] 12/19/2023 with palpitations.  Her troponins were minimally elevated and flat (21, 19).  Chest x-ray showed no acute disease.  proBNP was elevated at 572 (normal range for age group).  Hemoglobin was normal at 12, creatinine stable at 1.61 and potassium normal at 3.5.  EKG showed sinus rhythm, left bundle branch block.  Event monitor was placed and this demonstrated sinus rhythm, no atrial fibrillation or high-grade heart block.  There was 3% PVCs and 3% PACs.  Since her visit to the ED, no further episodes of racing heart, chest discomfort, pressure, shortness of breath, or syncope. Her blood pressure is sometimes elevated, with a reading of 170/70 today, which she attributes to walking from the parking lot. At home, her blood pressure readings have been as high as 130, but she has also recorded readings as low as 120/60.  She is currently taking Lasix  every other day to manage fluid retention and mentions a recent reduction in dosage to improve kidney function. No swelling, shortness of breath, or weight gain since adjusting her Lasix  dosage. She is awaiting clearance for a bone injection, pending improvement in her kidney function.     ROS-See HPI     Studies Reviewed:       08/17/2023: Total cholesterol 193, HDL 104, triglycerides 75, LDL 76 11/16/2023 total cholesterol 169, HDL 82, LDL 73, triglycerides 68, ALT 16 12/19/2023 Hgb 12, creatinine 1.61, K 3.5  HYPERTENSION CONTROL Vitals:   02/17/24 1001 02/17/24 1100  BP: (!) 171/75 (!) 170/70    The patient's blood pressure is elevated above target today.  In order to address the patient's elevated BP: The blood pressure is usually elevated in clinic.  Blood pressures monitored at home have been optimal.; Blood pressure will be monitored at home to determine if medication changes need to be made.         Physical Exam:  VS:  BP (!) 170/70   Pulse 78   Ht 5' 3 (1.6 m)   Wt 130 lb (59 kg)   SpO2 94%   BMI 23.03 kg/m        Wt Readings from Last 3 Encounters:  02/17/24 130 lb (59 kg)  10/27/23 133 lb 12.8 oz (60.7 kg)  08/17/23 130 lb 12.8 oz (59.3 kg)    Constitutional:      Appearance: Healthy appearance. Not in distress.  Neck:     Vascular: JVD normal.  Pulmonary:     Breath sounds: Normal breath sounds. No wheezing. No rales.  Cardiovascular:     Normal rate. Regular rhythm.     Murmurs: There is no murmur.  Edema:    Peripheral edema absent.  Abdominal:     Palpations: Abdomen is soft.       Assessment and Plan:    Assessment & Plan PVC's (premature ventricular contractions) Patient was seen in the emergency room in August with palpitations.  Monitor demonstrated 3% PVCs and 3% PACs.  There was no atrial fibrillation.  She has not had recurrent palpitations since.  Continue to monitor for now.  If she has recurrent palpitations  in the future, consider beta-blocker therapy. Chronic heart failure with preserved ejection fraction (HFpEF) (HCC) EF 60-65 by TTE in 09/2022.  She has reduced her furosemide  recently to help improve her kidney function so she can receive Boniva  injections for osteoporosis.  She has not had any worsening volume since.  She is NYHA II.  Volume status is stable. -Continue furosemide  20 mg Monday, Wednesday, Friday -Follow-up 6 months -Given Dr. Allena retirement, I will have her follow-up with Dr. Jeffrie or me Coronary artery disease involving native coronary artery of native heart with other form of angina pectoris Coronary artery calcification with a negative nuclear stress test in 2020 for ischemia or infarction.  No symptoms to suggest angina.  Continue rosuvastatin  5 mg daily, nitroglycerin  as needed Essential hypertension Blood pressure uncontrolled.  She describes whitecoat hypertension.  Blood pressures at home are normal.  Continue to monitor.  She knows to contact us  if her blood pressures are running high at home.  Continue telmisartan  20 mg daily. Pure hypercholesterolemia Recent LDL optimal.  Continue rosuvastatin  5 mg daily. History of DVT of lower extremity She is on chronic anticoagulation with rivaroxaban .         Dispo:  Return in about 6 months (around 08/17/2024) for Routine Follow Up, w/ Dr. Jeffrie, or Glendia Ferrier, PA-C.  Signed, Glendia Ferrier, PA-C

## 2024-02-16 NOTE — Assessment & Plan Note (Signed)
 EF 60-65 by TTE in 09/2022. ***

## 2024-02-17 ENCOUNTER — Ambulatory Visit: Attending: Cardiology | Admitting: Physician Assistant

## 2024-02-17 ENCOUNTER — Encounter: Payer: Self-pay | Admitting: Physician Assistant

## 2024-02-17 VITALS — BP 170/70 | HR 78 | Ht 63.0 in | Wt 130.0 lb

## 2024-02-17 DIAGNOSIS — I493 Ventricular premature depolarization: Secondary | ICD-10-CM

## 2024-02-17 DIAGNOSIS — I10 Essential (primary) hypertension: Secondary | ICD-10-CM | POA: Diagnosis not present

## 2024-02-17 DIAGNOSIS — Z86718 Personal history of other venous thrombosis and embolism: Secondary | ICD-10-CM

## 2024-02-17 DIAGNOSIS — I25118 Atherosclerotic heart disease of native coronary artery with other forms of angina pectoris: Secondary | ICD-10-CM | POA: Diagnosis not present

## 2024-02-17 DIAGNOSIS — I5032 Chronic diastolic (congestive) heart failure: Secondary | ICD-10-CM | POA: Diagnosis not present

## 2024-02-17 DIAGNOSIS — E7800 Pure hypercholesterolemia, unspecified: Secondary | ICD-10-CM

## 2024-02-17 NOTE — Assessment & Plan Note (Signed)
Recent LDL optimal.  Continue rosuvastatin 5 mg daily.

## 2024-02-17 NOTE — Assessment & Plan Note (Signed)
 Blood pressure uncontrolled.  She describes whitecoat hypertension.  Blood pressures at home are normal.  Continue to monitor.  She knows to contact us  if her blood pressures are running high at home.  Continue telmisartan  20 mg daily.

## 2024-02-17 NOTE — Patient Instructions (Signed)
 Medication Instructions:  No changes  *If you need a refill on your cardiac medications before your next appointment, please call your pharmacy*   Follow-Up: At [facility], you and your health needs are our priority.  As part of our continuing mission to provide you with exceptional heart care, our providers are all part of one team.  This team includes your primary Cardiologist (physician) and Advanced Practice Providers or APPs (Physician Assistants and Nurse Practitioners) who all work together to provide you with the care you need, when you need it.  Your next appointment:   6 month(s)  Provider:   Oneil Parchment, MD or Glendia Ferrier, PA-C          We recommend signing up for the patient portal called MyChart.  Sign up information is provided on this After Visit Summary.  MyChart is used to connect with patients for Virtual Visits (Telemedicine).  Patients are able to view lab/test results, encounter notes, upcoming appointments, etc.  Non-urgent messages can be sent to your provider as well.   To learn more about what you can do with MyChart, go to [email or web address].   Other Instructions Check your blood pressure often. If you see blood pressure readings over 150/90, call us .

## 2024-02-17 NOTE — Assessment & Plan Note (Signed)
 She is on chronic anticoagulation with rivaroxaban .

## 2024-03-16 ENCOUNTER — Other Ambulatory Visit (HOSPITAL_COMMUNITY): Payer: Self-pay | Admitting: Internal Medicine

## 2024-03-16 ENCOUNTER — Telehealth (HOSPITAL_COMMUNITY): Payer: Self-pay

## 2024-03-16 NOTE — Telephone Encounter (Signed)
 Auth Submission: NO AUTH NEEDED Site of care: Site of care: [facility] Payer: [employer] Medicare Medication & CPT/J Code(s) submitted: Boniva  ([id number]) Diagnosis Code: M81.0 Route of submission (phone, fax, portal): portal Phone # Fax # Auth type: Buy/Bill HB Units/visits requested: 3mg  q34months Reference number: [id number] Approval from: 03/16/24 to 05/11/24

## 2024-03-29 ENCOUNTER — Ambulatory Visit (HOSPITAL_COMMUNITY)
Admission: RE | Admit: 2024-03-29 | Discharge: 2024-03-29 | Disposition: A | Discharge status: Home / Self Care | Source: Ambulatory Visit | Attending: Internal Medicine | Admitting: Internal Medicine

## 2024-03-29 VITALS — BP 159/72 | HR 66 | Temp 97.7°F | Resp 16

## 2024-03-29 DIAGNOSIS — M810 Age-related osteoporosis without current pathological fracture: Secondary | ICD-10-CM | POA: Insufficient documentation

## 2024-03-29 MED ORDER — DIPHENHYDRAMINE HCL 25 MG PO CAPS: 25.0000 mg | ORAL_CAPSULE | Freq: Once | ORAL | Status: DC

## 2024-03-29 MED ORDER — ACETAMINOPHEN 325 MG PO TABS: 650.0000 mg | ORAL_TABLET | Freq: Once | ORAL | Status: DC

## 2024-03-29 MED ORDER — IBANDRONATE SODIUM 3 MG/3ML IV SOLN
3.0000 mg | Freq: Once | INTRAVENOUS | Status: AC
  Administered 2024-03-29: 3 mg via INTRAVENOUS

## 2024-03-29 MED ORDER — IBANDRONATE SODIUM 3 MG/3ML IV SOLN
INTRAVENOUS | Status: AC
  Filled 2024-03-29: qty 3

## 2024-04-22 ENCOUNTER — Encounter (HOSPITAL_COMMUNITY): Payer: Self-pay | Admitting: Internal Medicine

## 2024-05-16 ENCOUNTER — Telehealth (HOSPITAL_COMMUNITY): Payer: Self-pay | Admitting: Internal Medicine

## 2024-05-16 NOTE — Telephone Encounter (Signed)
 Patient referred to infusion pharmacy team for ambulatory infusion of Denosumab .  Insurance - [employer] Medicare Site of care - Site of care: [facility] Dx code - M81.0 Therapy - Boniva  on backorder, provider authorized change to Denosumab  every 6 months.  Infusion appointments - Scheduling team will schedule patient as soon as possible.    Thank you,  Norton Blush, PharmD, [facility] Pharmacist [facility]

## 2024-05-17 ENCOUNTER — Telehealth (HOSPITAL_COMMUNITY): Payer: Self-pay | Admitting: Pharmacy Technician

## 2024-05-17 NOTE — Telephone Encounter (Signed)
 Auth Submission: NO AUTH NEEDED Site of care: [facility] Payer: [employer] MEDICARE Medication & CPT/J Code(s) submitted: Jubbonti (denosumab -bbdz) V4863 Diagnosis Code: M81.0 Route of submission (phone, fax, portal):  Phone # Fax # Auth type: Buy/Bill HB Units/visits requested: 60mg  x 2 doses, q 6 months Reference number: [id number] Approval from: 05/17/2024 to 05/11/25    Dagoberto Armour, CPhT [facility] Phone: [phone] 05/17/2024

## 2024-06-02 ENCOUNTER — Telehealth: Payer: Self-pay

## 2024-06-02 NOTE — Telephone Encounter (Signed)
 Copied from CRM #[id number]. Topic: Clinical - Prescription Issue >> Jun 01, 2024  2:18 PM Benton KIDD wrote: Reason for CRM: patient is calling because got to have a new enroll every year for my dupixent  and dr geronimo will have to go through dupixent  [facility] to get prior authorization and my last shot is suppose to be today . Needing this done asap . Patient is needing the patient assistance for dupixent  and she say their yearly take home is [phone] dollars  Patient is needing this to be taken care of but patient was not aware had so much going on and didn't know she had to re enroll again for January Please give patient a call once this has been taken care of  [phone]

## 2024-06-03 ENCOUNTER — Other Ambulatory Visit (HOSPITAL_COMMUNITY): Payer: Self-pay

## 2024-06-03 NOTE — Telephone Encounter (Signed)
 Submitted a Prior Authorization request to [facility] MEDICARE for DUPIXENT  via CoverMyMeds. Authorization has been APPROVED from 06/03/24 to 05/11/25. Approval letter sent to scan center.   Test Claim revealed that a 28 day supply has a copay of $1484.25. Authorization#: [id number]

## 2024-06-06 NOTE — Telephone Encounter (Signed)
 Received a fax from  [employer]up[employer]  [employer] regarding an approval for DUPIXENT  patient assistance from 06/06/2024 to 05/11/2025. Approval letter sent to scan center.  Phone #: [phone] Fax #: [phone]

## 2024-06-14 ENCOUNTER — Telehealth: Payer: Self-pay | Admitting: *Deleted

## 2024-06-14 ENCOUNTER — Telehealth: Payer: Self-pay

## 2024-06-14 NOTE — Telephone Encounter (Signed)
 Copied from CRM #[id number]. Topic: Clinical - Prescription Issue >> Jun 14, 2024 10:49 AM [name] wrote: Patient states she needs her dupilumab  (DUPIXENT ) 200 MG/1. prefilled syringe  this week and that the pharmacy called [facility] yesterday to request the prescription [facility])  >> Jun 13, 2024  3:39 PM CMA Warren ORN wrote: Duplicate ecounter >> Jun 13, 2024 11:05 AM [name] wrote: [name] with [facility] [phone] fax #[phone] calling requesting the prescription the for dupixent  be sent to them.

## 2024-06-16 ENCOUNTER — Telehealth (HOSPITAL_COMMUNITY): Payer: Self-pay | Admitting: Internal Medicine

## 2024-06-16 NOTE — Telephone Encounter (Signed)
 Spoke with Rosina at Dr. Sheppard office. Pt is now on Tymlos and will no longer need appt for Jubbonti.  Thank you,  Norton Blush, PharmD, [facility] Pharmacist [facility]

## 2024-06-17 ENCOUNTER — Telehealth: Payer: Self-pay

## 2024-06-17 NOTE — Telephone Encounter (Signed)
 Copied from CRM #[id number]. Topic: Clinical - Prescription Issue >> Jun 01, 2024  2:18 PM Benton KIDD wrote: Reason for CRM: patient is calling because got to have a new enroll every year for my dupixent  and dr geronimo will have to go through dupixent  [facility] to get prior authorization and my last shot is suppose to be today . Needing this done asap . Patient is needing the patient assistance for dupixent  and she say their yearly take home is [phone] dollars  Patient is needing this to be taken care of but patient was not aware had so much going on and didn't know she had to re enroll again for January Please give patient a call once this has been taken care of  [phone] >> Jun 17, 2024 11:03 AM Chantha C wrote: Patient [phone] states dupilumab  (DUPIXENT ) 200 MG/1. prefilled syringe was not set and wants it sent to [facility] pharmacy [phone], will take a verbal prescription today and they will fill medication and send by next Tuesday. Patient takes medication every two weeks, patient is out of medication. Patient has been trying to take care of this issues for two weeks with the office. Patient states [facility] pharmacy does not provide Dupixent  anymore. Per CAL, send crm. Please advised and call back.   [facility] - [place], [place] - [address] [place] [address] Phone: [phone] Fax: [phone]    >> Jun 14, 2024 10:49 AM Corean SAUNDERS wrote: Patient states she needs her dupilumab  (DUPIXENT ) 200 MG/1. prefilled syringe  this week and that the pharmacy called [facility] yesterday to request the prescription [facility])  >> Jun 13, 2024  3:39 PM CMA Warren ORN wrote: Duplicate ecounter >> Jun 13, 2024 11:05 AM Rozanna MATSU wrote: [name] with [facility] [phone] fax #[phone] calling requesting the prescription the for dupixent  be sent to them.

## 2024-06-29 ENCOUNTER — Inpatient Hospital Stay (HOSPITAL_COMMUNITY): Admission: RE | Admit: 2024-06-29 | Source: Ambulatory Visit

## 2024-06-30 ENCOUNTER — Encounter (HOSPITAL_COMMUNITY)

## 2024-07-27 ENCOUNTER — Ambulatory Visit: Admitting: Primary Care

## 2024-08-16 ENCOUNTER — Ambulatory Visit
